# Patient Record
Sex: Female | Born: 1950
Health system: Southern US, Community
[De-identification: ages and names within clinical notes are randomized; demographics above are authoritative.]

## PROBLEM LIST (undated history)

## (undated) DIAGNOSIS — R030 Elevated blood-pressure reading, without diagnosis of hypertension: Secondary | ICD-10-CM

## (undated) DIAGNOSIS — E28319 Asymptomatic premature menopause: Secondary | ICD-10-CM

## (undated) DIAGNOSIS — H269 Unspecified cataract: Secondary | ICD-10-CM

## (undated) DIAGNOSIS — J9801 Acute bronchospasm: Secondary | ICD-10-CM

## (undated) DIAGNOSIS — M810 Age-related osteoporosis without current pathological fracture: Secondary | ICD-10-CM

## (undated) DIAGNOSIS — T7840XA Allergy, unspecified, initial encounter: Secondary | ICD-10-CM

## (undated) DIAGNOSIS — E785 Hyperlipidemia, unspecified: Secondary | ICD-10-CM

## (undated) DIAGNOSIS — I2089 Other forms of angina pectoris: Secondary | ICD-10-CM

## (undated) DIAGNOSIS — F32A Depression, unspecified: Secondary | ICD-10-CM

## (undated) DIAGNOSIS — M1990 Unspecified osteoarthritis, unspecified site: Secondary | ICD-10-CM

## (undated) DIAGNOSIS — R002 Palpitations: Secondary | ICD-10-CM

## (undated) DIAGNOSIS — E559 Vitamin D deficiency, unspecified: Secondary | ICD-10-CM

## (undated) DIAGNOSIS — I208 Other forms of angina pectoris: Secondary | ICD-10-CM

## (undated) DIAGNOSIS — F909 Attention-deficit hyperactivity disorder, unspecified type: Secondary | ICD-10-CM

## (undated) DIAGNOSIS — B349 Viral infection, unspecified: Secondary | ICD-10-CM

## (undated) DIAGNOSIS — R200 Anesthesia of skin: Secondary | ICD-10-CM

## (undated) DIAGNOSIS — R040 Epistaxis: Secondary | ICD-10-CM

## (undated) DIAGNOSIS — R3589 Other polyuria: Secondary | ICD-10-CM

## (undated) DIAGNOSIS — Z5189 Encounter for other specified aftercare: Secondary | ICD-10-CM

## (undated) DIAGNOSIS — H9190 Unspecified hearing loss, unspecified ear: Secondary | ICD-10-CM

## (undated) DIAGNOSIS — D649 Anemia, unspecified: Secondary | ICD-10-CM

## (undated) DIAGNOSIS — G43909 Migraine, unspecified, not intractable, without status migrainosus: Secondary | ICD-10-CM

## (undated) DIAGNOSIS — R5383 Other fatigue: Secondary | ICD-10-CM

## (undated) DIAGNOSIS — G4730 Sleep apnea, unspecified: Secondary | ICD-10-CM

## (undated) DIAGNOSIS — I499 Cardiac arrhythmia, unspecified: Secondary | ICD-10-CM

## (undated) DIAGNOSIS — Z923 Personal history of irradiation: Secondary | ICD-10-CM

## (undated) DIAGNOSIS — E669 Obesity, unspecified: Secondary | ICD-10-CM

## (undated) DIAGNOSIS — E039 Hypothyroidism, unspecified: Secondary | ICD-10-CM

## (undated) DIAGNOSIS — F329 Major depressive disorder, single episode, unspecified: Secondary | ICD-10-CM

## (undated) DIAGNOSIS — K5790 Diverticulosis of intestine, part unspecified, without perforation or abscess without bleeding: Secondary | ICD-10-CM

## (undated) DIAGNOSIS — R358 Other polyuria: Secondary | ICD-10-CM

## (undated) DIAGNOSIS — R569 Unspecified convulsions: Secondary | ICD-10-CM

## (undated) DIAGNOSIS — Z9889 Other specified postprocedural states: Secondary | ICD-10-CM

## (undated) DIAGNOSIS — R0600 Dyspnea, unspecified: Secondary | ICD-10-CM

## (undated) DIAGNOSIS — R202 Paresthesia of skin: Secondary | ICD-10-CM

## (undated) DIAGNOSIS — R9431 Abnormal electrocardiogram [ECG] [EKG]: Secondary | ICD-10-CM

## (undated) DIAGNOSIS — I444 Left anterior fascicular block: Secondary | ICD-10-CM

## (undated) DIAGNOSIS — Z7902 Long term (current) use of antithrombotics/antiplatelets: Secondary | ICD-10-CM

## (undated) DIAGNOSIS — R413 Other amnesia: Secondary | ICD-10-CM

## (undated) HISTORY — DX: Obesity, unspecified: E66.9

## (undated) HISTORY — DX: Asymptomatic premature menopause: E28.319

## (undated) HISTORY — DX: Depression, unspecified: F32.A

## (undated) HISTORY — DX: Anemia, unspecified: D64.9

## (undated) HISTORY — PX: CATARACT EXTRACTION W/ INTRAOCULAR LENS IMPLANT: SHX1309

## (undated) HISTORY — DX: Hypothyroidism, unspecified: E03.9

## (undated) HISTORY — PX: ABDOMINAL HYSTERECTOMY: SHX81

## (undated) HISTORY — PX: EYE SURGERY: SHX253

## (undated) HISTORY — DX: Other polyuria: R35.8

## (undated) HISTORY — DX: Major depressive disorder, single episode, unspecified: F32.9

## (undated) HISTORY — DX: Unspecified hearing loss, unspecified ear: H91.90

## (undated) HISTORY — DX: Other fatigue: R53.83

## (undated) HISTORY — DX: Allergy, unspecified, initial encounter: T78.40XA

## (undated) HISTORY — DX: Acute bronchospasm: J98.01

## (undated) HISTORY — DX: Migraine, unspecified, not intractable, without status migrainosus: G43.909

## (undated) HISTORY — DX: Other polyuria: R35.89

## (undated) HISTORY — DX: Anesthesia of skin: R20.2

## (undated) HISTORY — DX: Encounter for other specified aftercare: Z51.89

## (undated) HISTORY — DX: Elevated blood-pressure reading, without diagnosis of hypertension: R03.0

## (undated) HISTORY — DX: Anesthesia of skin: R20.0

## (undated) HISTORY — DX: Unspecified convulsions: R56.9

## (undated) HISTORY — DX: Personal history of irradiation: Z92.3

## (undated) HISTORY — PX: COLONOSCOPY: SHX174

## (undated) HISTORY — DX: Sleep apnea, unspecified: G47.30

## (undated) HISTORY — DX: Age-related osteoporosis without current pathological fracture: M81.0

## (undated) HISTORY — DX: Other amnesia: R41.3

## (undated) HISTORY — DX: Epistaxis: R04.0

## (undated) HISTORY — DX: Unspecified osteoarthritis, unspecified site: M19.90

## (undated) HISTORY — DX: Viral infection, unspecified: B34.9

## (undated) HISTORY — PX: TONSILLECTOMY: SUR1361

## (undated) HISTORY — DX: Attention-deficit hyperactivity disorder, unspecified type: F90.9

---

## 1992-02-04 DIAGNOSIS — R569 Unspecified convulsions: Secondary | ICD-10-CM

## 1992-02-04 HISTORY — DX: Unspecified convulsions: R56.9

## 2004-02-04 HISTORY — PX: ABLATION: SHX5711

## 2004-06-28 ENCOUNTER — Other Ambulatory Visit: Payer: Self-pay

## 2004-07-04 ENCOUNTER — Ambulatory Visit: Payer: Self-pay | Admitting: Unknown Physician Specialty

## 2005-02-18 ENCOUNTER — Ambulatory Visit: Payer: Self-pay | Admitting: Family Medicine

## 2005-02-18 IMAGING — CR DG CHEST 2V
1 series · 2 of 2 positions shown · non-contrast
Comparison: none

REASON FOR EXAM: BRONCHITIS
COMMENTS:

[Series 3298: postero_anterior · 0.11mm/px · 2 of 2 slices shown]
[im 1/2]
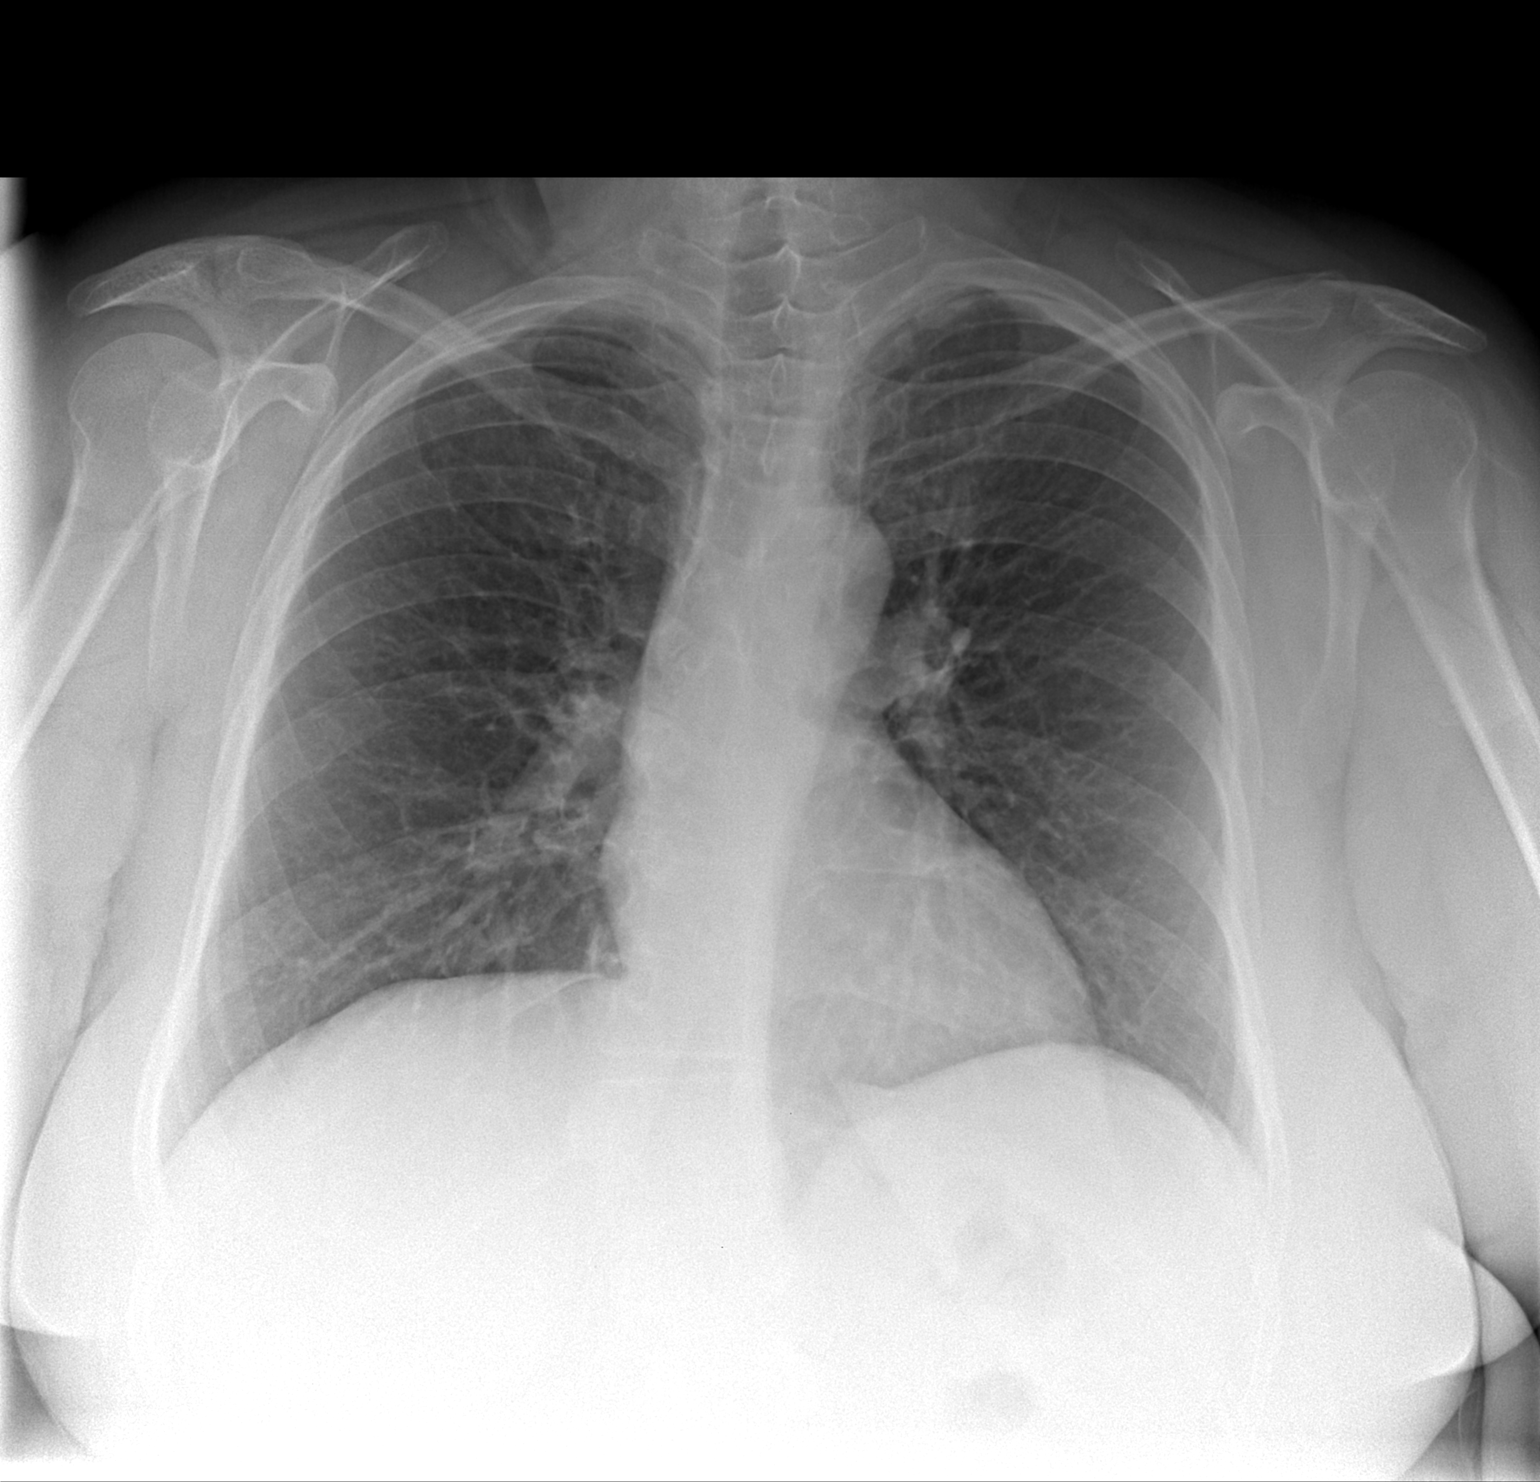
[im 2/2]
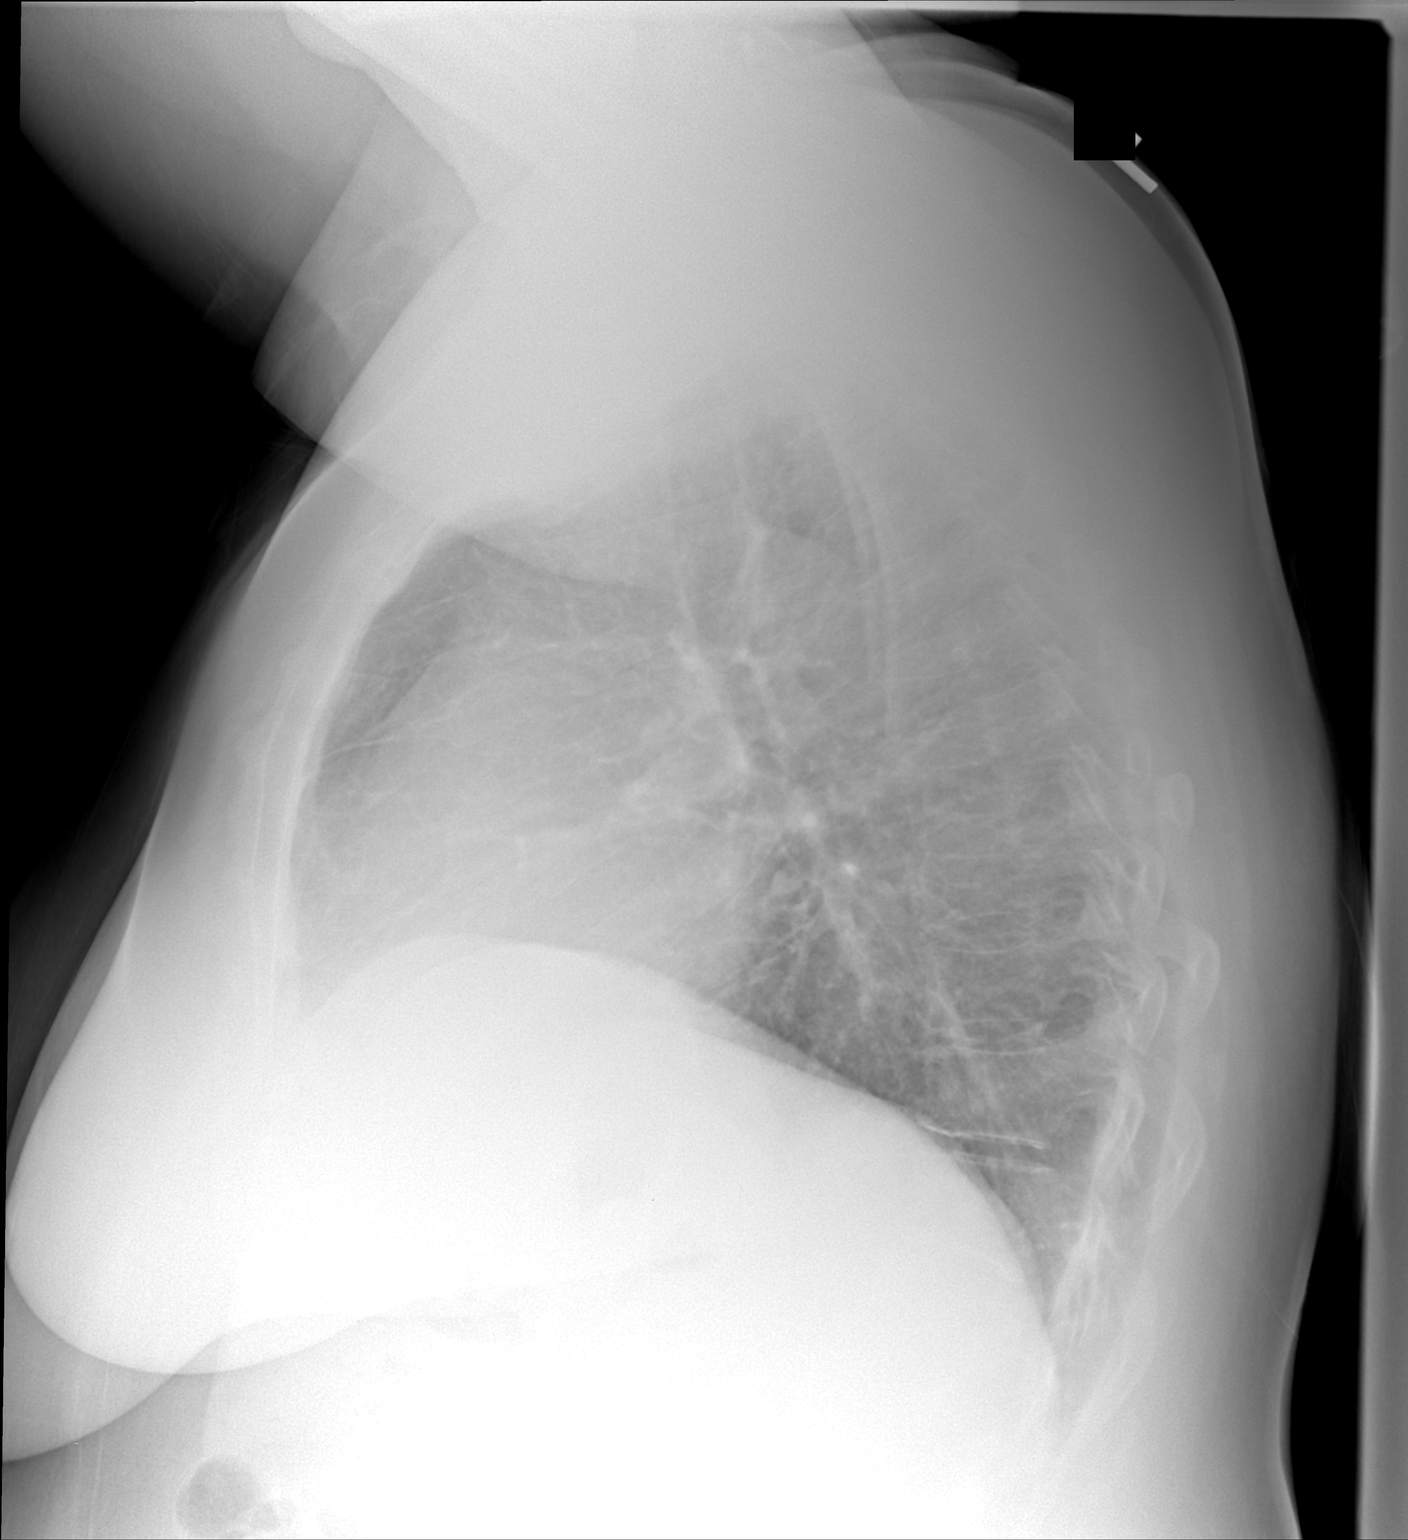

[2 of 2 positions shown; findings below may reference images not displayed]

PROCEDURE:     DXR - DXR CHEST PA (OR AP) AND LATERAL  - [DATE] [DATE]

RESULT:     The lung fields are clear.  No pneumonia, pneumothorax, or
pleural effusion is seen.  The heart, mediastinal, and osseous structures
show no acute changes.  Incidental note is made of a mild thoracic scoliosis
with convexity to the RIGHT.
IMPRESSION: No acute changes are identified.

## 2006-09-07 DIAGNOSIS — M810 Age-related osteoporosis without current pathological fracture: Secondary | ICD-10-CM | POA: Insufficient documentation

## 2006-09-07 DIAGNOSIS — F321 Major depressive disorder, single episode, moderate: Secondary | ICD-10-CM | POA: Insufficient documentation

## 2007-02-15 ENCOUNTER — Ambulatory Visit: Payer: Self-pay | Admitting: Family Medicine

## 2008-03-22 ENCOUNTER — Ambulatory Visit: Payer: Self-pay | Admitting: Family Medicine

## 2008-03-22 IMAGING — CR DG KNEE COMPLETE 4+V*L*
1 series · 4 of 4 positions shown · non-contrast
Comparison: none

REASON FOR EXAM: pain in joint
COMMENTS:

[Series 2: view not recorded · 0.17mm/px · 4 of 4 slices shown]
[im 1/4]
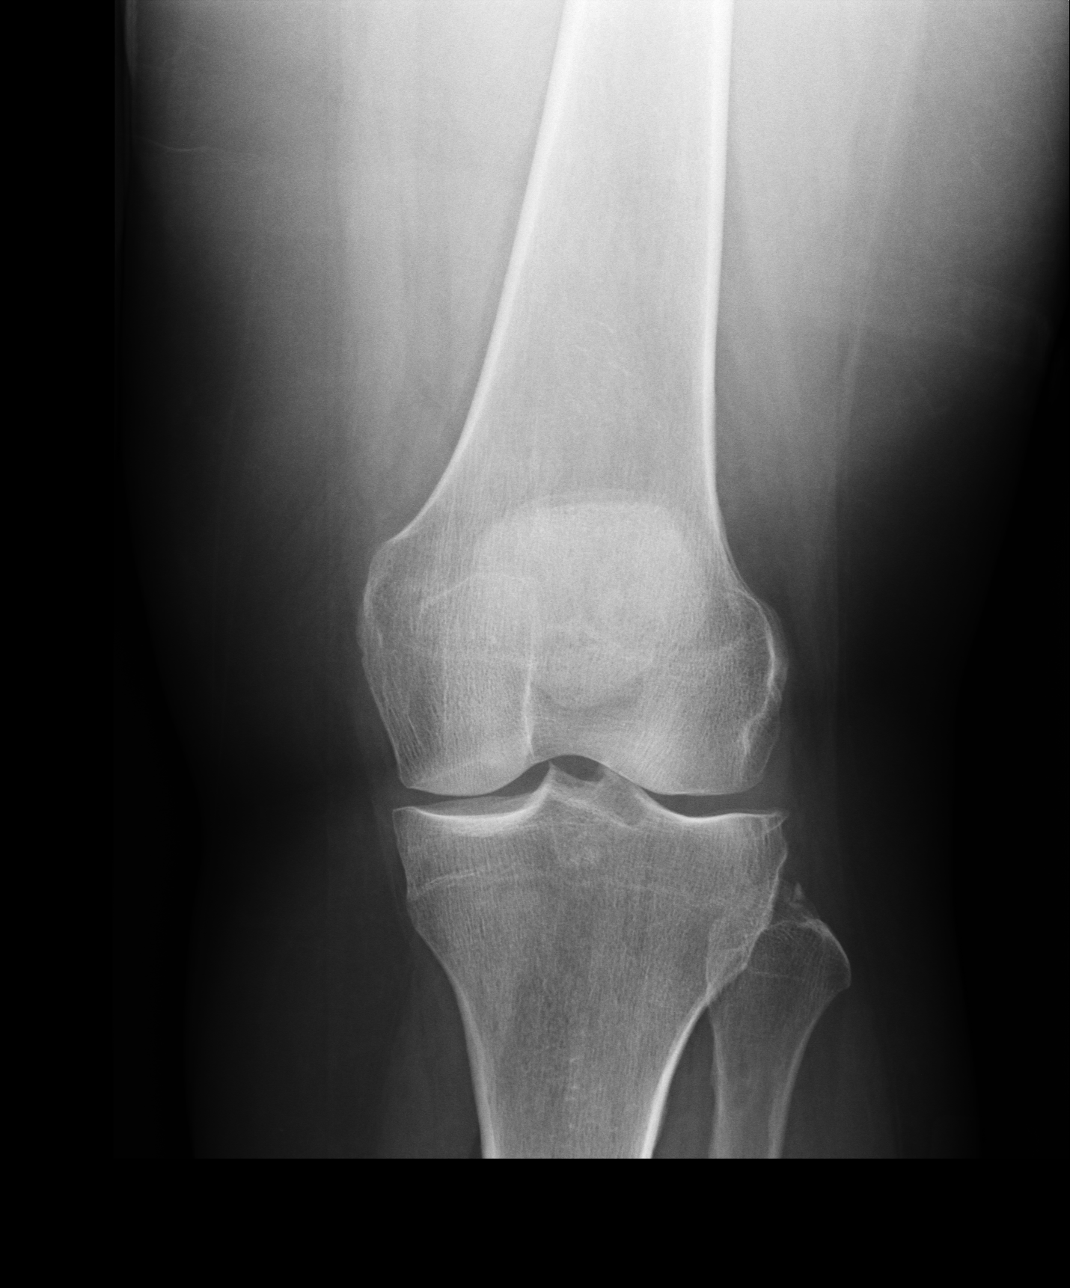
[im 2/4]
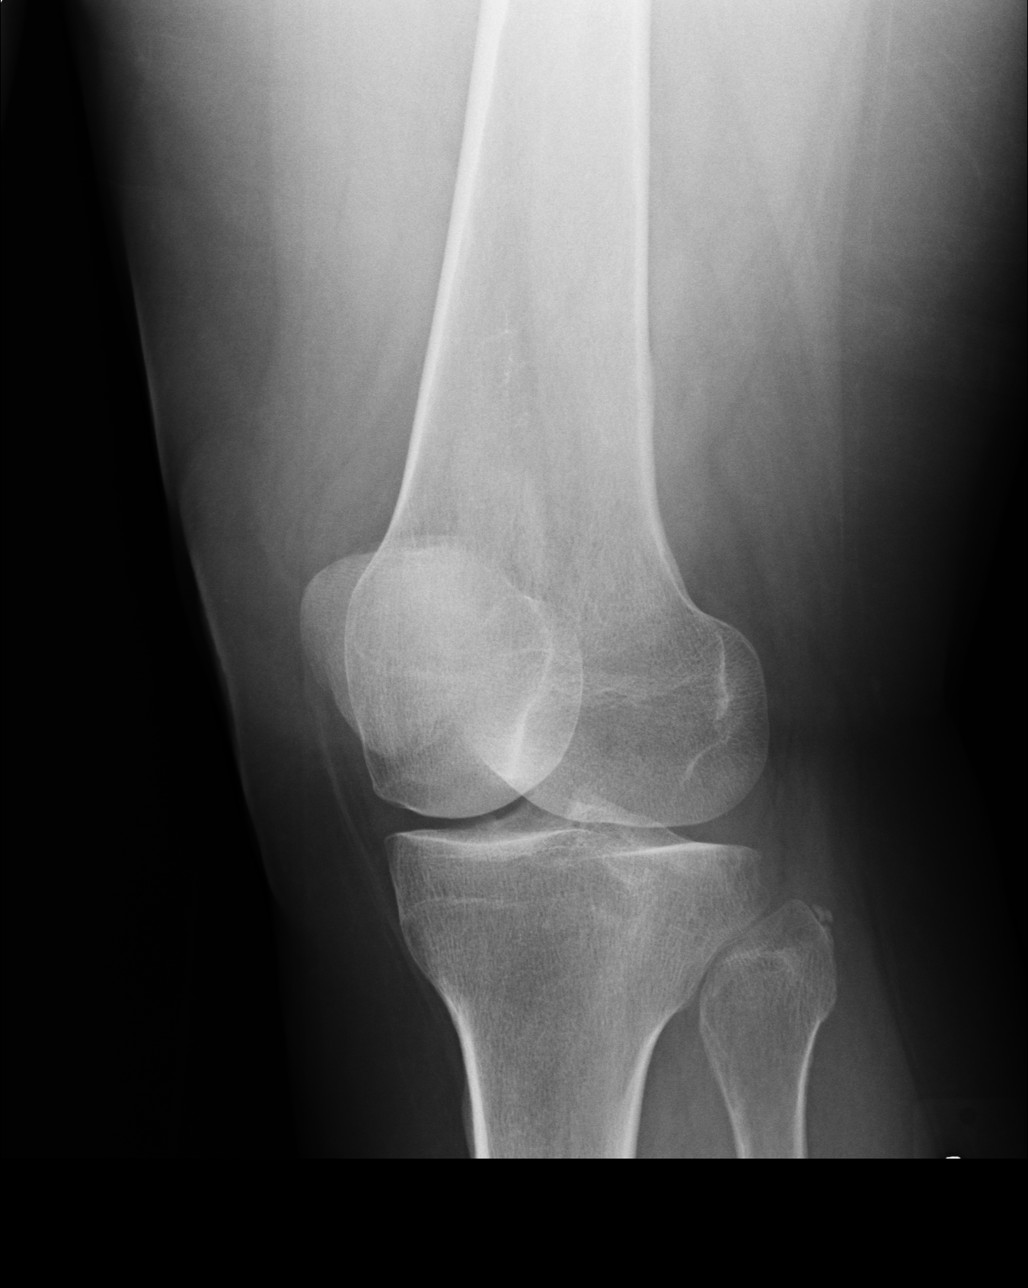
[im 3/4]
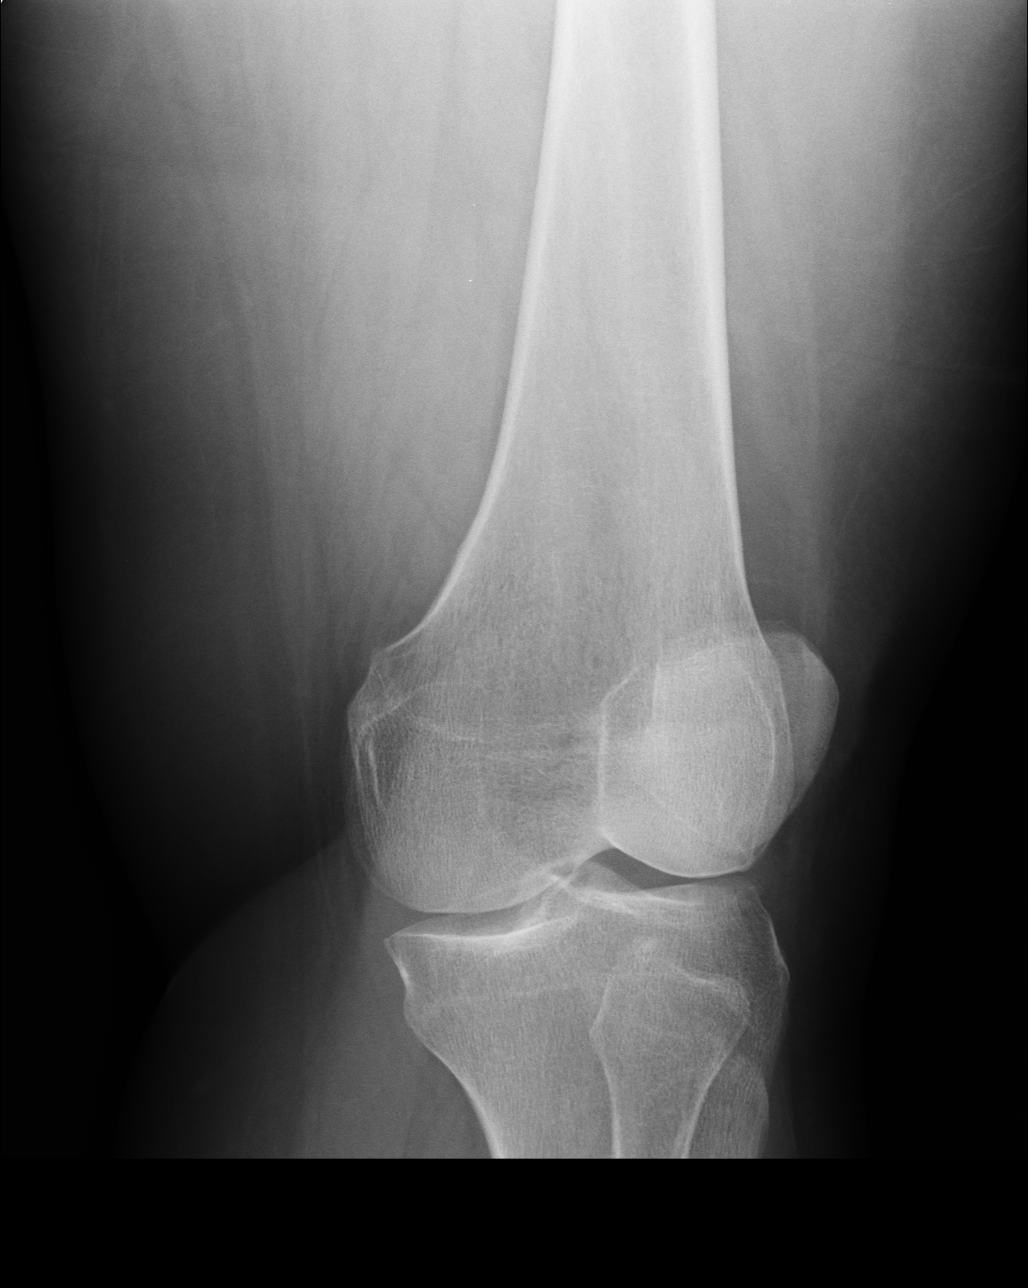
[im 4/4]
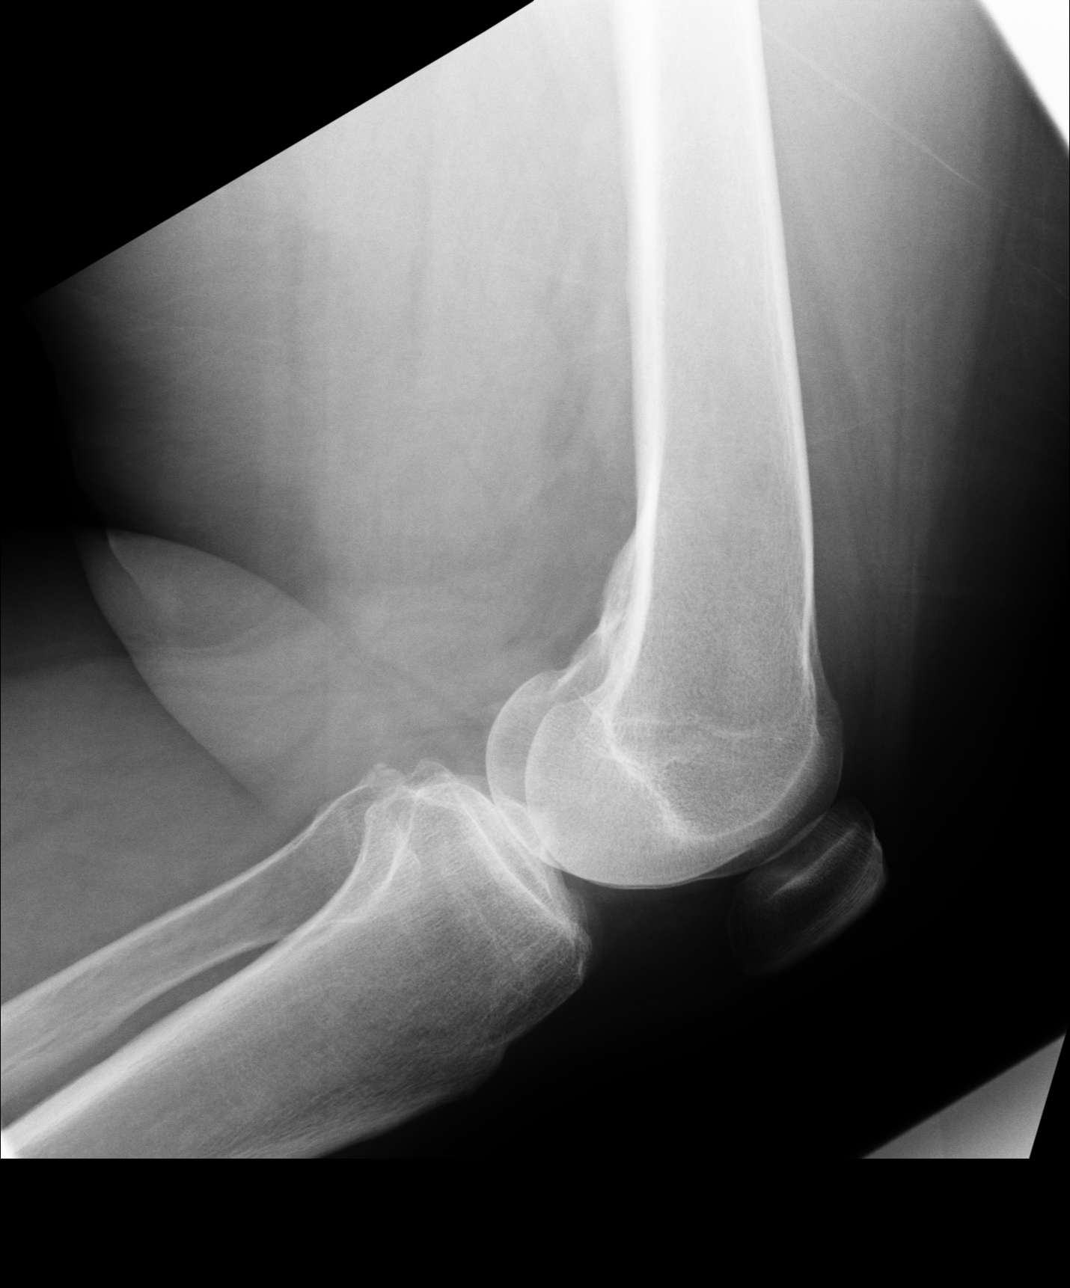

[4 of 4 positions shown; findings below may reference images not displayed]

PROCEDURE:     KDR - KDXR KNEE LT COMP WITH OBLIQUES  - [DATE] [DATE]

RESULT:     Four views of the left knee reveal the bones to be mildly
osteopenic. There is beaking of the tibial spines. There is no evidence of
acute fracture. There is a tiny bony density adjacent to the proximal
portion of the fibula but the appearance does not suggest that of an acute
or old fracture. I do not see definite evidence of a joint effusion.
IMPRESSION: There are mild degenerative changes of the left knee.
Further evaluation with MRI may be useful especially if internal derangement
is suspected.

## 2008-03-22 IMAGING — CR RIGHT HIP - COMPLETE 2+ VIEW
1 series · 2 of 2 positions shown · non-contrast
Comparison: none

REASON FOR EXAM: pain in joint
COMMENTS:

PROCEDURE:     KDR - KDXR HIP RIGHT COMPLETE  - [DATE] [DATE]
RESULT:     AP and lateral views of the right hip are submitted. The bones
are mildly osteopenic. I do not see evidence of an acute fracture nor of
significant degenerative change.

[Series 2: view not recorded · 0.17mm/px · 2 of 2 slices shown]
[im 1/2]
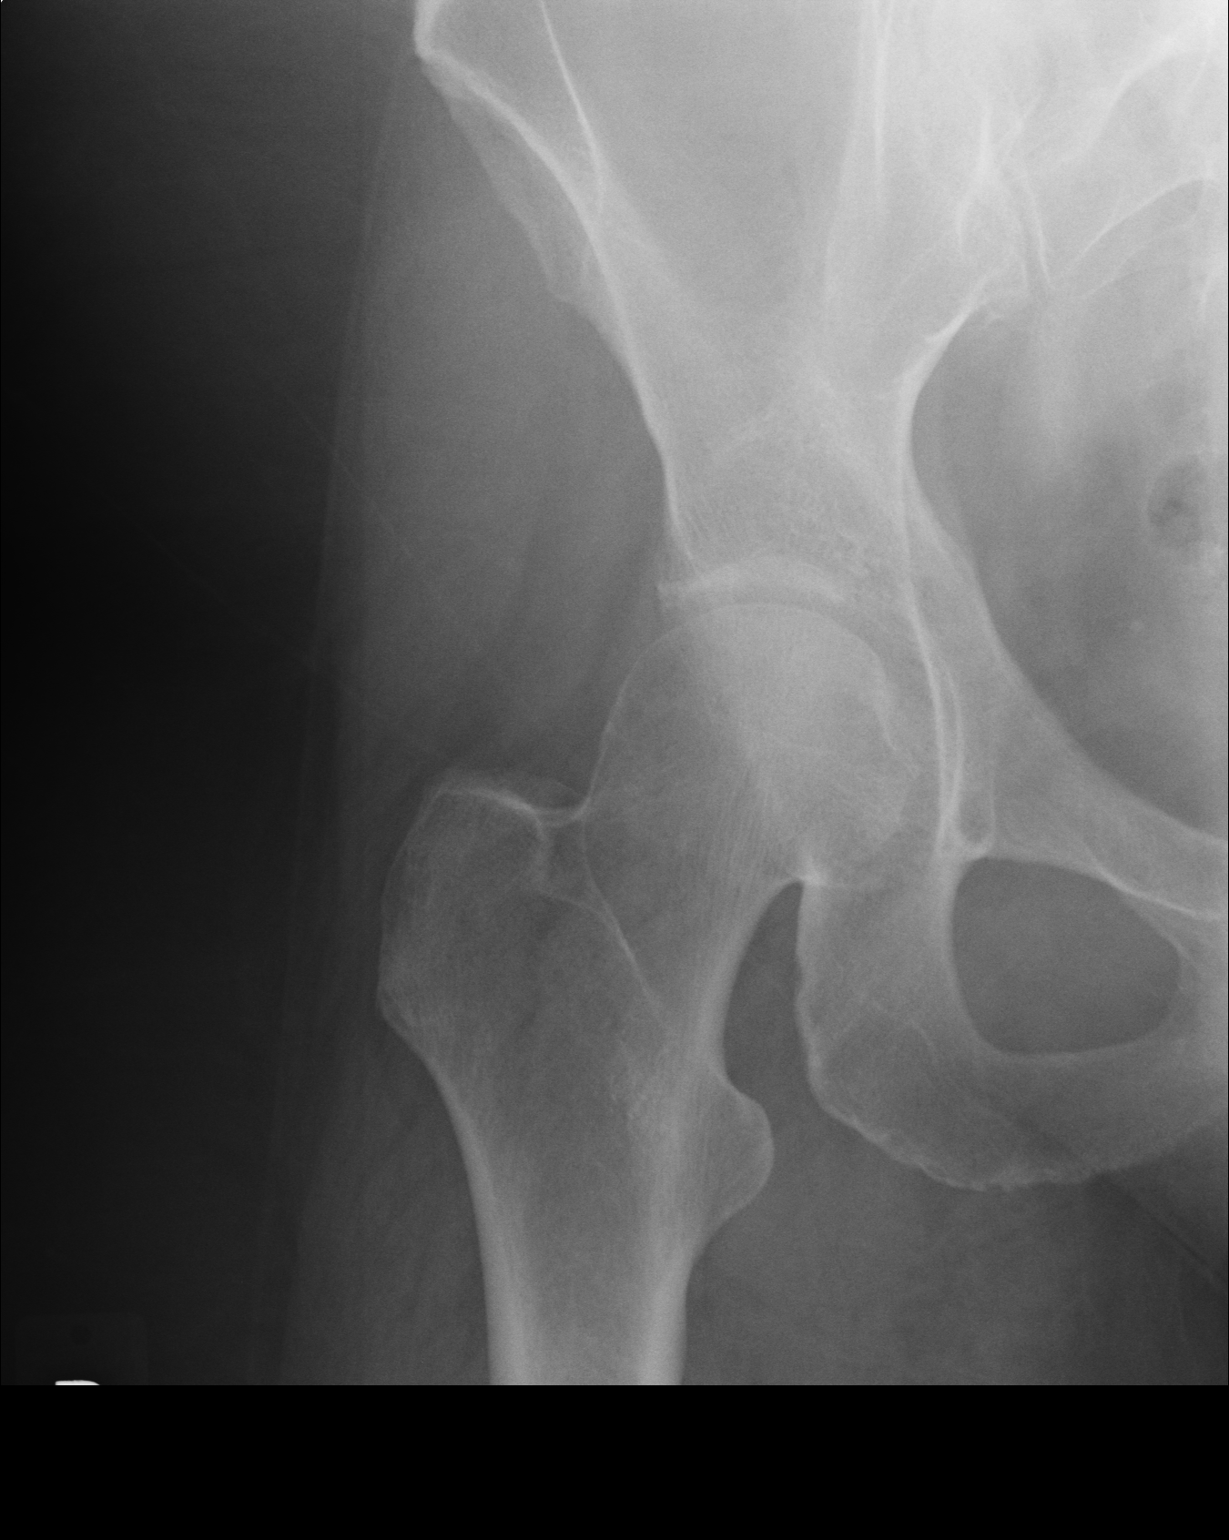
[im 2/2]
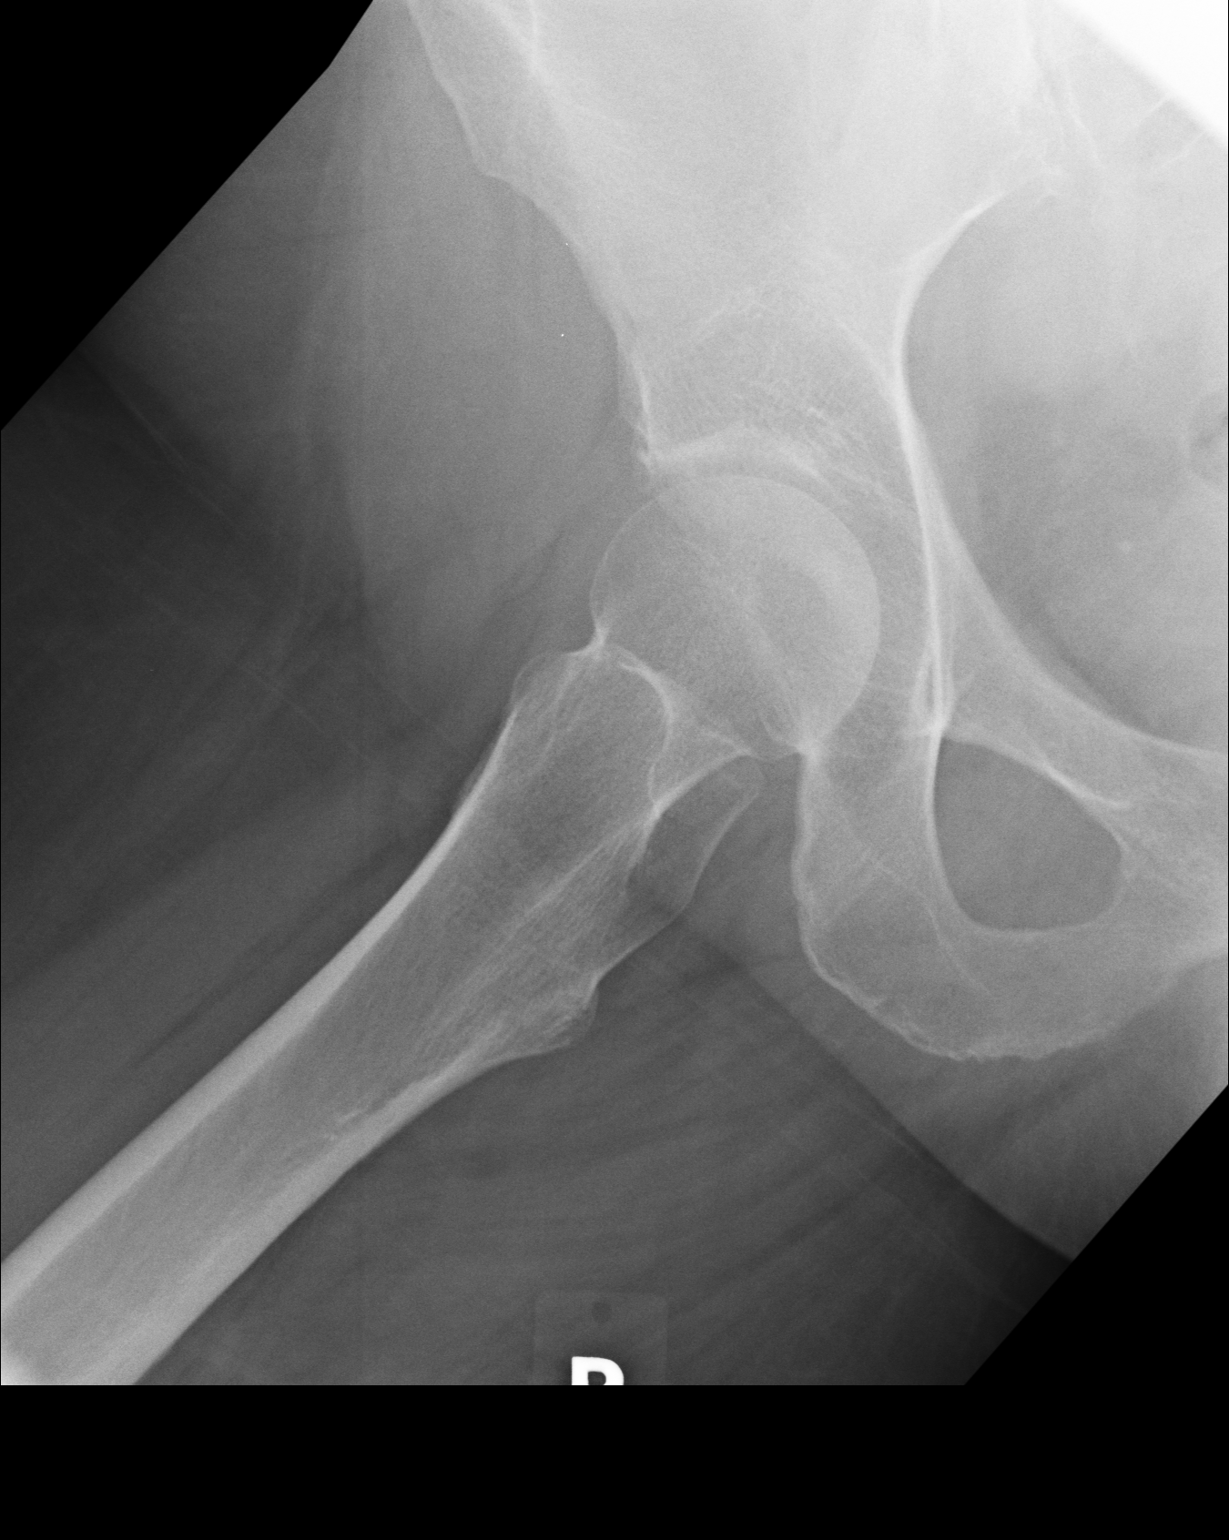

[2 of 2 positions shown; findings below may reference images not displayed]

IMPRESSION: I do not see acute abnormality of the right hip. Followup
imaging is available if the patient's symptoms warrant further evaluation

## 2008-09-19 DIAGNOSIS — G43009 Migraine without aura, not intractable, without status migrainosus: Secondary | ICD-10-CM | POA: Insufficient documentation

## 2014-08-09 ENCOUNTER — Ambulatory Visit (INDEPENDENT_AMBULATORY_CARE_PROVIDER_SITE_OTHER): Payer: BLUE CROSS/BLUE SHIELD | Admitting: Family Medicine

## 2014-08-09 ENCOUNTER — Encounter: Payer: Self-pay | Admitting: Family Medicine

## 2014-08-09 ENCOUNTER — Ambulatory Visit
Admission: RE | Admit: 2014-08-09 | Discharge: 2014-08-09 | Disposition: A | Discharge status: Home / Self Care | Payer: BLUE CROSS/BLUE SHIELD | Source: Ambulatory Visit | Attending: Family Medicine | Admitting: Family Medicine

## 2014-08-09 VITALS — BP 128/86 | HR 87 | Temp 98.0°F | Resp 18 | Ht 65.0 in | Wt 243.0 lb

## 2014-08-09 DIAGNOSIS — J40 Bronchitis, not specified as acute or chronic: Secondary | ICD-10-CM

## 2014-08-09 DIAGNOSIS — R05 Cough: Secondary | ICD-10-CM | POA: Diagnosis not present

## 2014-08-09 DIAGNOSIS — E038 Other specified hypothyroidism: Secondary | ICD-10-CM | POA: Diagnosis not present

## 2014-08-09 DIAGNOSIS — R053 Chronic cough: Secondary | ICD-10-CM

## 2014-08-09 DIAGNOSIS — E039 Hypothyroidism, unspecified: Secondary | ICD-10-CM | POA: Insufficient documentation

## 2014-08-09 MED ORDER — AZITHROMYCIN 250 MG PO TABS: ORAL_TABLET | ORAL | Status: DC

## 2014-08-09 MED ORDER — DEXTROMETHORPHAN POLISTIREX ER 30 MG/5ML PO SUER: 30.0000 mg | Freq: Two times a day (BID) | ORAL | Status: DC

## 2014-08-09 MED ORDER — TRAMADOL HCL 50 MG PO TABS: 50.0000 mg | ORAL_TABLET | Freq: Three times a day (TID) | ORAL | Status: DC | PRN

## 2014-08-09 NOTE — Progress Notes (Signed)
Name: Natalie [name]   MRN: [id number]    DOB: Jun 30, 1950   Date:08/09/2014       Progress Note  Subjective  Chief Complaint  Chief Complaint  Patient presents with  . Bronchitis    patient stated that her symptoms has persisted for 2 months now. Patient recently went to [place] and was given a steroid injection.  . Medication Refill    patient needs her medication refilled    Cough This is a new problem. The current episode started more than 1 month ago. The problem has been gradually worsening. The problem occurs constantly. Pertinent negatives include no chest pain, chills, eye redness, fever, headaches, heartburn, hemoptysis, myalgias, sore throat, shortness of breath or weight loss.  Migraine  This is a recurrent problem. The current episode started more than 1 year ago. The problem occurs intermittently. The problem has been unchanged. The pain is located in the right unilateral region. The pain radiates to the right neck. The pain quality is similar to prior headaches. The quality of the pain is described as aching and throbbing. The pain is moderate. Associated symptoms include coughing, insomnia, nausea, neck pain, phonophobia and photophobia. Pertinent negatives include no back pain, blurred vision, dizziness, eye redness, fever, hearing loss, seizures, sore throat, tingling, tinnitus, vomiting, weakness or weight loss. The symptoms are aggravated by weather changes and work. She has tried triptans and oral narcotics for the symptoms. The treatment provided moderate relief.  Thyroid Problem Presents for follow-up visit. Symptoms include cold intolerance, depressed mood, fatigue and weight gain. Patient reports no anxiety, constipation, diarrhea, palpitations, tremors or weight loss. The symptoms have been worsening. Past treatments include levothyroxine. The treatment provided mild relief. Her past medical history is significant for obesity.      Past Medical History  Diagnosis  Date  . Migraine   . Premature menopause   . Attention deficit disorder with hyperactivity   . Hypothyroidism   . Depressive disorder   . Acute bronchospasm due to viral infection   . Osteoporosis   . Obesity (BMI 30.0-34.9)   . Hearing loss   . Osteoarthritis   . Blood pressure elevated without history of HTN   . Epistaxis   . Numbness and tingling   . Polyuria   . Fatigue   . Memory change   . Anemia     history of  . Seizures     history of mini seizures, possible migraine induced    History  Substance Use Topics  . Smoking status: Former Research scientist (life sciences)  . Smokeless tobacco: Not on file     Comment: patient has not used tobacco products in 18yrs  . Alcohol Use: 0.0 oz/week    0 Standard drinks or equivalent per week     Comment: very seldom; socially     Current outpatient prescriptions:  .  buPROPion (WELLBUTRIN SR) 150 MG 12 hr tablet, Take 150 mg by mouth 2 (two) times daily., Disp: , Rfl:  .  citalopram (CELEXA) 40 MG tablet, Take 40 mg by mouth daily., Disp: , Rfl:  .  levothyroxine (SYNTHROID, LEVOTHROID) 75 MCG tablet, Take 75 mcg by mouth daily before breakfast., Disp: , Rfl:  .  meloxicam (MOBIC) 7.5 MG tablet, Take 7.5 mg by mouth daily., Disp: , Rfl:  .  SUMAtriptan (IMITREX) 100 MG tablet, Take 100 mg by mouth every 2 (two) hours as needed for migraine. May repeat in 2 hours if headache persists or recurs., Disp: , Rfl:  .  topiramate (TOPAMAX) 25 MG tablet, Take 25 mg by mouth 2 (two) times daily., Disp: , Rfl:   Allergies  Allergen Reactions  . Sulfur Hives    Review of Systems  Constitutional: Positive for weight gain, malaise/fatigue and fatigue. Negative for fever, chills and weight loss.       Obesity  HENT: Negative.  Negative for congestion, hearing loss, sore throat and tinnitus.   Eyes: Positive for photophobia. Negative for blurred vision, double vision and redness.  Respiratory: Positive for cough. Negative for hemoptysis and shortness of breath.    Cardiovascular: Negative for chest pain, palpitations, orthopnea, claudication and leg swelling.  Gastrointestinal: Positive for nausea. Negative for heartburn, vomiting, diarrhea, constipation and blood in stool.  Genitourinary: Negative for dysuria, urgency, frequency and hematuria.  Musculoskeletal: Positive for neck pain. Negative for myalgias, back pain, joint pain and falls.  Skin: Negative for itching.  Neurological: Negative for dizziness, tingling, tremors, focal weakness, seizures, loss of consciousness, weakness and headaches.  Endo/Heme/Allergies: Positive for cold intolerance. Does not bruise/bleed easily.  Psychiatric/Behavioral: Positive for depression and memory loss. Negative for substance abuse. The patient has insomnia. The patient is not nervous/anxious.      Objective  Filed Vitals:   08/09/14 1010  BP: 128/86  Pulse: 87  Temp: 98 F (36.7 C)  TempSrc: Oral  Resp: 18  Height: 5\' 5"  (1.651 m)  Weight: 243 lb (110.224 kg)  SpO2: 94%     Physical Exam  Constitutional: She is oriented to person, place, and time and well-developed, well-nourished, and in no distress.  Obese  HENT:  Head: Normocephalic.  Eyes: EOM are normal. Pupils are equal, round, and reactive to light.  Neck: Normal range of motion. No thyromegaly present.  Cardiovascular: Normal rate, regular rhythm and normal heart sounds.   No murmur heard. Pulmonary/Chest: Effort normal and breath sounds normal.  Abdominal: Soft. Bowel sounds are normal.  Musculoskeletal: Normal range of motion. She exhibits no edema.  Neurological: She is alert and oriented to person, place, and time. No cranial nerve deficit. Gait normal.  Skin: Skin is warm and dry. No rash noted.  Psychiatric: Memory normal.  Depressed and anxious      Assessment & Plan  1. Bronchitis  - traMADol (ULTRAM) 50 MG tablet; Take 1 tablet (50 mg total) by mouth every 8 (eight) hours as needed.  Dispense: 30 tablet; Refill:  0 - dextromethorphan (DELSYM) 30 MG/5ML liquid; Take 5 mLs (30 mg total) by mouth 2 (two) times daily.  Dispense: 89 mL; Refill: 0 - azithromycin (ZITHROMAX) 250 MG tablet; 2 x 1 day then one daily  Dispense: 6 tablet; Refill: 0  2. Chronic cough Secondary to above - DG Chest 2 View; Future  3. Other specified hypothyroidism Lab on today

## 2014-08-09 NOTE — Progress Notes (Signed)
Name: Natalie [name]   MRN: [id number]    DOB: 02-04-1950   Date:08/09/2014       Progress Note  Subjective  Chief Complaint  Chief Complaint  Patient presents with  . Bronchitis    patient stated that her symptoms has persisted for 2 months now. Patient recently went to [place] and was given a steroid injection.  . Medication Refill    patient needs her medication refilled    HPI  Patient is here today with concerns regarding the following symptoms non productive cough and achiness that started 2 months ago.  Associated with fatigue and malaise. Patient was recently seen at [facility] and was given a steroid injection. She was offered 3 breathing treatments but declined. Patient has used otc throat lozenges and has increased her fluids. Patient states although she has had these symptoms for approximately 2 months it has worsened the last week. Patient reports of nausea, vomiting and lack of energy.  No problem-specific assessment & plan notes found for this encounter.   Past Medical History  Diagnosis Date  . Migraine   . Premature menopause   . Attention deficit disorder with hyperactivity   . Hypothyroidism   . Depressive disorder   . Acute bronchospasm due to viral infection   . Osteoporosis   . Obesity (BMI 30.0-34.9)   . Hearing loss   . Osteoarthritis   . Blood pressure elevated without history of HTN   . Epistaxis   . Numbness and tingling   . Polyuria   . Fatigue   . Memory change   . Anemia     history of  . Seizures     history of mini seizures, possible migraine induced    History  Substance Use Topics  . Smoking status: Former Research scientist (life sciences)  . Smokeless tobacco: Not on file     Comment: patient has not used tobacco products in 63yrs  . Alcohol Use: 0.0 oz/week    0 Standard drinks or equivalent per week     Comment: very seldom; socially     Current outpatient prescriptions:  .  buPROPion (WELLBUTRIN SR) 150 MG 12 hr tablet, Take 150 mg by mouth 2 (two)  times daily., Disp: , Rfl:  .  citalopram (CELEXA) 40 MG tablet, Take 40 mg by mouth daily., Disp: , Rfl:  .  levothyroxine (SYNTHROID, LEVOTHROID) 75 MCG tablet, Take 75 mcg by mouth daily before breakfast., Disp: , Rfl:  .  meloxicam (MOBIC) 7.5 MG tablet, Take 7.5 mg by mouth daily., Disp: , Rfl:  .  SUMAtriptan (IMITREX) 100 MG tablet, Take 100 mg by mouth every 2 (two) hours as needed for migraine. May repeat in 2 hours if headache persists or recurs., Disp: , Rfl:  .  topiramate (TOPAMAX) 25 MG tablet, Take 25 mg by mouth 2 (two) times daily., Disp: , Rfl:   Allergies  Allergen Reactions  . Sulfur Hives    ROS  10 Systems reviewed and is negative except as mentioned in HPI.   Objective  Filed Vitals:   08/09/14 1010  BP: 128/86  Pulse: 87  Temp: 98 F (36.7 C)  TempSrc: Oral  Resp: 18  Height: 5\' 5"  (1.651 m)  Weight: 243 lb (110.224 kg)  SpO2: 94%   Body mass index is 40.44 kg/(m^2).   Physical Exam  Constitutional: Patient appears well-developed and well-nourished. In no acute distress but does appear to be fatigued from acute illness. HEENT:  - Head: Normocephalic and atraumatic.  -  Ears: RIGHT TM bulging with minimal clear exudate, LEFT TM bulging with minimal clear exudate.  - Nose: Nasal mucosa boggy and congested.  - Mouth/Throat: Oropharynx is moist with slight erythema of bilateral tonsils without hypertrophy or exudates. Post nasal drainage present.  - Eyes: Conjunctivae clear, EOM movements normal. PERRLA. No scleral icterus.  Neck: Normal range of motion. Neck supple. No JVD present. No thyromegaly present. No local lymphadenopathy. Cardiovascular: Regular rate, regular rhythm with no murmurs heard.  Pulmonary/Chest: Effort normal and breath sounds clear in all lung fields.  Musculoskeletal: Normal range of motion bilateral UE and LE, no joint effusions. Skin: Skin is warm and dry. No rash noted. Psychiatric: Patient has a normal mood and affect.  Behavior is normal in office today. Judgment and thought content normal in office today.   Assessment & Plan  Etiologies include allergic rhinitis, viral or bacterial infection. Instructed patient on increasing hydration, nasal saline spray, steam inhalation, NSAID if tolerated and not contraindicated. If not already doing so start taking daily anti-histamine and use a steroid nasal spray. If symptoms persist/worsen may consider antibiotic therapy.

## 2014-08-09 NOTE — Patient Instructions (Signed)

## 2014-08-10 ENCOUNTER — Telehealth: Payer: Self-pay | Admitting: Emergency Medicine

## 2014-08-10 NOTE — Telephone Encounter (Signed)
Left message for patient of negative chest xray

## 2014-08-11 ENCOUNTER — Encounter: Payer: Self-pay | Admitting: Family Medicine

## 2014-09-26 ENCOUNTER — Other Ambulatory Visit: Payer: Self-pay | Admitting: Family Medicine

## 2014-10-26 ENCOUNTER — Encounter: Payer: Self-pay | Admitting: Family Medicine

## 2014-10-26 ENCOUNTER — Ambulatory Visit (INDEPENDENT_AMBULATORY_CARE_PROVIDER_SITE_OTHER): Payer: BLUE CROSS/BLUE SHIELD | Admitting: Family Medicine

## 2014-10-26 VITALS — BP 126/77 | HR 84 | Temp 98.7°F | Resp 19 | Ht 65.0 in | Wt 235.4 lb

## 2014-10-26 DIAGNOSIS — Z23 Encounter for immunization: Secondary | ICD-10-CM

## 2014-10-26 DIAGNOSIS — R05 Cough: Secondary | ICD-10-CM | POA: Diagnosis not present

## 2014-10-26 DIAGNOSIS — R058 Other specified cough: Secondary | ICD-10-CM

## 2014-10-26 MED ORDER — FLUTICASONE PROPIONATE 50 MCG/ACT NA SUSP: 1.0000 | Freq: Every day | NASAL | Status: DC

## 2014-10-26 MED ORDER — BENZONATATE 200 MG PO CAPS: 200.0000 mg | ORAL_CAPSULE | Freq: Three times a day (TID) | ORAL | Status: DC | PRN

## 2014-10-26 MED ORDER — AZITHROMYCIN 250 MG PO TABS: ORAL_TABLET | ORAL | Status: DC

## 2014-10-26 NOTE — Progress Notes (Signed)
Name: Natalie [name]   MRN: [id number]    DOB: 09-17-1950   Date:10/26/2014       Progress Note Subjective  Chief Complaint  Chief Complaint  Patient presents with  . Acute Visit    Possible Bronchitis  . Cough  . Fatigue    Cough This is a new (started after she 'scoped' her yard and started coughing the day after. Thinks it may be from allergies.) problem. The current episode started 1 to 4 weeks ago. The cough is productive of sputum. Pertinent negatives include no chills, fever, nasal congestion, sore throat or shortness of breath. The symptoms are aggravated by pollens. She has tried OTC cough suppressant for the symptoms. Her past medical history is significant for environmental allergies (Spring and Fall season allergies.). There is no history of asthma, bronchitis or COPD.   Past Medical History  Diagnosis Date  . Migraine   . Premature menopause   . Attention deficit disorder with hyperactivity   . Hypothyroidism   . Depressive disorder   . Acute bronchospasm due to viral infection   . Osteoporosis   . Obesity (BMI 30.0-34.9)   . Hearing loss   . Osteoarthritis   . Blood pressure elevated without history of HTN   . Epistaxis   . Numbness and tingling   . Polyuria   . Fatigue   . Memory change   . Anemia     history of  . Seizures     history of mini seizures, possible migraine induced    Past Surgical History  Procedure Laterality Date  . Ablation  2006    Family History  Problem Relation Age of Onset  . Early death Father     suicide  . Depression Father   . Diabetes Father   . Cancer Father     prostate  . Hearing loss Father     due to war  . Alzheimer's disease Mother   . Heart disease Brother   . Heart disease Maternal Aunt   . Dementia Maternal Aunt   . Heart disease Maternal Uncle   . Dementia Maternal Grandmother   . Heart disease Brother     Social History   Social History  . Marital Status: Single    Spouse Name: N/A  . Number of  Children: 1  . Years of Education: N/A   Occupational History  . Not on file.   Social History Main Topics  . Smoking status: Former Research scientist (life sciences)  . Smokeless tobacco: Not on file     Comment: patient has not used tobacco products in 52yrs  . Alcohol Use: 0.0 oz/week    0 Standard drinks or equivalent per week     Comment: very seldom; socially  . Drug Use: No  . Sexual Activity: No   Other Topics Concern  . Not on file   Social History Narrative    Current outpatient prescriptions:  .  buPROPion (WELLBUTRIN SR) 150 MG 12 hr tablet, Take 150 mg by mouth 2 (two) times daily., Disp: , Rfl:  .  citalopram (CELEXA) 40 MG tablet, Take 40 mg by mouth daily., Disp: , Rfl:  .  levothyroxine (SYNTHROID, LEVOTHROID) 75 MCG tablet, TAKE ONE TABLET BY MOUTH EVERY DAY, Disp: 30 tablet, Rfl: 4 .  meloxicam (MOBIC) 7.5 MG tablet, Take 7.5 mg by mouth daily., Disp: , Rfl:  .  SUMAtriptan (IMITREX) 100 MG tablet, Take 100 mg by mouth every 2 (two) hours as needed for migraine. May  repeat in 2 hours if headache persists or recurs., Disp: , Rfl:  .  topiramate (TOPAMAX) 25 MG tablet, Take 25 mg by mouth 2 (two) times daily., Disp: , Rfl:  .  azithromycin (ZITHROMAX Z-PAK) 250 MG tablet, 2 tabs po x day 1, then 1 tab po q day x 4 days., Disp: 6 each, Rfl: 0 .  benzonatate (TESSALON) 200 MG capsule, Take 1 capsule (200 mg total) by mouth 3 (three) times daily as needed for cough., Disp: 21 capsule, Rfl: 0 .  fluticasone (FLONASE) 50 MCG/ACT nasal spray, Place 1 spray into both nostrils daily., Disp: 16 g, Rfl: 0  Allergies  Allergen Reactions  . Sulfur Hives  . Sulfamethoxazole-Trimethoprim     Review of Systems  Constitutional: Negative for fever and chills.  HENT: Negative for sore throat.   Respiratory: Positive for cough. Negative for shortness of breath.   Endo/Heme/Allergies: Positive for environmental allergies (Spring and Fall season allergies.).   Objective  Filed Vitals:   10/26/14  1350  BP: 126/77  Pulse: 84  Temp: 98.7 F (37.1 C)  TempSrc: Oral  Resp: 19  Height: 5\' 5"  (1.651 m)  Weight: 235 lb 6.4 oz (106.777 kg)  SpO2: 93%    Physical Exam  Constitutional: She is well-developed, well-nourished, and in no distress.  HENT:  Mouth/Throat: No posterior oropharyngeal erythema.  Cardiovascular: Normal rate and regular rhythm.   Pulmonary/Chest: Breath sounds normal.  Crackles on auscultation on the left side of chest  Nursing note and vitals reviewed.  Assessment & Plan  1. Need for immunization against influenza  - Flu Vaccine QUAD 36+ mos IM  2. Productive cough One-week history of recurrent productive cough, unresponsive to OTC therapy. We'll prescribe Z-Pak, Tessalon and Flonase for relief of respiratory and allergic symptoms. If her symptoms persist, she will need a chest x-ray for evaluation. Patient verbalized understanding with the plan. - azithromycin (ZITHROMAX Z-PAK) 250 MG tablet; 2 tabs po x day 1, then 1 tab po q day x 4 days.  Dispense: 6 each; Refill: 0 - benzonatate (TESSALON) 200 MG capsule; Take 1 capsule (200 mg total) by mouth 3 (three) times daily as needed for cough.  Dispense: 21 capsule; Refill: 0 - fluticasone (FLONASE) 50 MCG/ACT nasal spray; Place 1 spray into both nostrils daily.  Dispense: 16 g; Refill: 0    Syed Asad A. [facility] 10/26/2014 7:08 PM

## 2014-10-31 ENCOUNTER — Ambulatory Visit
Admission: RE | Admit: 2014-10-31 | Discharge: 2014-10-31 | Disposition: A | Discharge status: Home / Self Care | Payer: BLUE CROSS/BLUE SHIELD | Source: Ambulatory Visit | Attending: Family Medicine | Admitting: Family Medicine

## 2014-10-31 ENCOUNTER — Encounter: Payer: Self-pay | Admitting: Family Medicine

## 2014-10-31 ENCOUNTER — Ambulatory Visit (INDEPENDENT_AMBULATORY_CARE_PROVIDER_SITE_OTHER): Payer: BLUE CROSS/BLUE SHIELD | Admitting: Family Medicine

## 2014-10-31 VITALS — BP 120/84 | HR 81 | Temp 98.2°F | Resp 18 | Ht 65.0 in | Wt 235.6 lb

## 2014-10-31 DIAGNOSIS — R053 Chronic cough: Secondary | ICD-10-CM

## 2014-10-31 DIAGNOSIS — R05 Cough: Secondary | ICD-10-CM

## 2014-10-31 MED ORDER — GUAIFENESIN-CODEINE 100-10 MG/5ML PO SYRP: 10.0000 mL | ORAL_SOLUTION | Freq: Four times a day (QID) | ORAL | Status: DC | PRN

## 2014-10-31 NOTE — Progress Notes (Signed)
Name: Natalie [name]   MRN: [id number]    DOB: 1951/01/27   Date:10/31/2014       Progress Note  Subjective  Chief Complaint  Chief Complaint  Patient presents with  . Acute Visit    Cough/wheezing not feeling better    HPI Patient is here for follow-up from last week, she was seen for 2 week history of persistent productive cough with greenish mucus, unresponsive to OTC therapy. She was started on Z-Pak and [employer]. Pt. reports persistent cough, but now with clear mucus. She has no fevers or chills.  Past Medical History  Diagnosis Date  . Migraine   . Premature menopause   . Attention deficit disorder with hyperactivity   . Hypothyroidism   . Depressive disorder   . Acute bronchospasm due to viral infection   . Osteoporosis   . Obesity (BMI 30.0-34.9)   . Hearing loss   . Osteoarthritis   . Blood pressure elevated without history of HTN   . Epistaxis   . Numbness and tingling   . Polyuria   . Fatigue   . Memory change   . Anemia     history of  . Seizures     history of mini seizures, possible migraine induced    Past Surgical History  Procedure Laterality Date  . Ablation  2006    Family History  Problem Relation Age of Onset  . Early death Father     suicide  . Depression Father   . Diabetes Father   . Cancer Father     prostate  . Hearing loss Father     due to war  . Alzheimer's disease Mother   . Heart disease Brother   . Heart disease Maternal Aunt   . Dementia Maternal Aunt   . Heart disease Maternal Uncle   . Dementia Maternal Grandmother   . Heart disease Brother     Social History   Social History  . Marital Status: Single    Spouse Name: N/A  . Number of Children: 1  . Years of Education: N/A   Occupational History  . Not on file.   Social History Main Topics  . Smoking status: Former Research scientist (life sciences)  . Smokeless tobacco: Not on file     Comment: patient has not used tobacco products in 102yrs  . Alcohol Use: 0.0 oz/week    0  Standard drinks or equivalent per week     Comment: very seldom; socially  . Drug Use: No  . Sexual Activity: No   Other Topics Concern  . Not on file   Social History Narrative    Current outpatient prescriptions:  .  buPROPion (WELLBUTRIN SR) 150 MG 12 hr tablet, Take 150 mg by mouth 2 (two) times daily., Disp: , Rfl:  .  citalopram (CELEXA) 40 MG tablet, Take 40 mg by mouth daily., Disp: , Rfl:  .  fluticasone (FLONASE) 50 MCG/ACT nasal spray, Place 1 spray into both nostrils daily., Disp: 16 g, Rfl: 0 .  levothyroxine (SYNTHROID, LEVOTHROID) 75 MCG tablet, TAKE ONE TABLET BY MOUTH EVERY DAY, Disp: 30 tablet, Rfl: 4 .  meloxicam (MOBIC) 7.5 MG tablet, Take 7.5 mg by mouth daily., Disp: , Rfl:  .  SUMAtriptan (IMITREX) 100 MG tablet, Take 100 mg by mouth every 2 (two) hours as needed for migraine. May repeat in 2 hours if headache persists or recurs., Disp: , Rfl:  .  topiramate (TOPAMAX) 25 MG tablet, Take 25 mg by mouth  2 (two) times daily., Disp: , Rfl:  .  traMADol (ULTRAM) 50 MG tablet, , Disp: , Rfl: 0 .  guaiFENesin-codeine (ROBITUSSIN AC) 100-10 MG/5ML syrup, Take 10 mLs by mouth 4 (four) times daily as needed for cough., Disp: 200 mL, Rfl: 0  Allergies  Allergen Reactions  . Sulfur Hives  . Sulfamethoxazole-Trimethoprim     Review of Systems  Constitutional: Negative for fever and chills.  Respiratory: Positive for cough and sputum production. Negative for hemoptysis, shortness of breath and wheezing.    Objective  Filed Vitals:   10/31/14 0835  BP: 120/84  Pulse: 81  Temp: 98.2 F (36.8 C)  TempSrc: Oral  Resp: 18  Height: 5\' 5"  (1.651 m)  Weight: 235 lb 9.6 oz (106.867 kg)  SpO2: 93%    Physical Exam  Constitutional: She is well-developed, well-nourished, and in no distress.  HENT:  Head: Normocephalic and atraumatic.  Mouth/Throat: No posterior oropharyngeal erythema.  Cardiovascular: Normal rate and regular rhythm.   Pulmonary/Chest: Breath sounds  normal.  Crackles in left lung still auscultated.  Nursing note and vitals reviewed.   Assessment & Plan  1. Persistent cough for 3 weeks or longer Cough unresponsive to [employer] We will switch to codeine with guaifenesin. Chest x-ray was ordered for evaluation of crackles. If patient is not improving within 48 hours, will consider prednisone. Patient verbalized understanding the plan.  - DG Chest 2 View; Future - guaiFENesin-codeine (ROBITUSSIN AC) 100-10 MG/5ML syrup; Take 10 mLs by mouth 4 (four) times daily as needed for cough.  Dispense: 200 mL; Refill: 0   Syed Asad A. [facility] 10/31/2014 5:32 PM

## 2014-11-02 ENCOUNTER — Telehealth: Payer: Self-pay | Admitting: Family Medicine

## 2014-11-02 NOTE — Telephone Encounter (Signed)
Wanted to let you know that she is still coughing, it has improved some but not a lot (not really productive cough). She is still wheezing and have heaviness. Also requesting xray results from Tuesday.

## 2014-11-03 NOTE — Telephone Encounter (Signed)
Spoke with patient and she stated she is going to take the prescribed cough medicine and see how it works and if the cough continues to hang on she will come back to be seen in office and take the steroid suggested by Dr. Manuella Ghazi , she will call some time next week to let us know

## 2015-02-12 ENCOUNTER — Ambulatory Visit: Payer: BLUE CROSS/BLUE SHIELD | Admitting: Family Medicine

## 2015-03-26 ENCOUNTER — Other Ambulatory Visit: Payer: Self-pay | Admitting: Family Medicine

## 2015-04-16 ENCOUNTER — Ambulatory Visit: Payer: BLUE CROSS/BLUE SHIELD | Admitting: Family Medicine

## 2015-04-21 ENCOUNTER — Other Ambulatory Visit: Payer: Self-pay | Admitting: Family Medicine

## 2015-04-30 DIAGNOSIS — F330 Major depressive disorder, recurrent, mild: Secondary | ICD-10-CM | POA: Diagnosis not present

## 2015-05-14 ENCOUNTER — Other Ambulatory Visit: Payer: Self-pay

## 2015-05-14 ENCOUNTER — Ambulatory Visit: Payer: BLUE CROSS/BLUE SHIELD | Admitting: Family Medicine

## 2015-05-14 ENCOUNTER — Ambulatory Visit: Payer: Self-pay

## 2015-05-14 DIAGNOSIS — Z5189 Encounter for other specified aftercare: Secondary | ICD-10-CM

## 2015-05-17 DIAGNOSIS — I209 Angina pectoris, unspecified: Secondary | ICD-10-CM | POA: Diagnosis not present

## 2015-05-17 DIAGNOSIS — E669 Obesity, unspecified: Secondary | ICD-10-CM | POA: Diagnosis not present

## 2015-05-17 DIAGNOSIS — I208 Other forms of angina pectoris: Secondary | ICD-10-CM | POA: Diagnosis not present

## 2015-05-17 DIAGNOSIS — R5383 Other fatigue: Secondary | ICD-10-CM | POA: Diagnosis not present

## 2015-05-17 DIAGNOSIS — R0602 Shortness of breath: Secondary | ICD-10-CM | POA: Diagnosis not present

## 2015-05-17 DIAGNOSIS — F418 Other specified anxiety disorders: Secondary | ICD-10-CM | POA: Diagnosis not present

## 2015-05-17 DIAGNOSIS — R9431 Abnormal electrocardiogram [ECG] [EKG]: Secondary | ICD-10-CM | POA: Diagnosis not present

## 2015-05-22 DIAGNOSIS — I208 Other forms of angina pectoris: Secondary | ICD-10-CM | POA: Diagnosis not present

## 2015-05-22 DIAGNOSIS — R9431 Abnormal electrocardiogram [ECG] [EKG]: Secondary | ICD-10-CM | POA: Diagnosis not present

## 2015-05-26 ENCOUNTER — Other Ambulatory Visit: Payer: Self-pay | Admitting: Family Medicine

## 2015-06-05 DIAGNOSIS — G2581 Restless legs syndrome: Secondary | ICD-10-CM | POA: Diagnosis not present

## 2015-06-05 DIAGNOSIS — R0602 Shortness of breath: Secondary | ICD-10-CM | POA: Diagnosis not present

## 2015-06-05 DIAGNOSIS — I208 Other forms of angina pectoris: Secondary | ICD-10-CM | POA: Diagnosis not present

## 2015-06-05 DIAGNOSIS — R5383 Other fatigue: Secondary | ICD-10-CM | POA: Diagnosis not present

## 2015-06-05 DIAGNOSIS — R42 Dizziness and giddiness: Secondary | ICD-10-CM | POA: Diagnosis not present

## 2015-06-05 DIAGNOSIS — R9431 Abnormal electrocardiogram [ECG] [EKG]: Secondary | ICD-10-CM | POA: Diagnosis not present

## 2015-06-05 DIAGNOSIS — F418 Other specified anxiety disorders: Secondary | ICD-10-CM | POA: Diagnosis not present

## 2015-06-05 DIAGNOSIS — I209 Angina pectoris, unspecified: Secondary | ICD-10-CM | POA: Diagnosis not present

## 2015-06-05 DIAGNOSIS — E669 Obesity, unspecified: Secondary | ICD-10-CM | POA: Diagnosis not present

## 2015-06-20 DIAGNOSIS — F330 Major depressive disorder, recurrent, mild: Secondary | ICD-10-CM | POA: Diagnosis not present

## 2015-06-28 ENCOUNTER — Ambulatory Visit (INDEPENDENT_AMBULATORY_CARE_PROVIDER_SITE_OTHER): Payer: Medicare Other | Admitting: Family Medicine

## 2015-06-28 ENCOUNTER — Encounter: Payer: Self-pay | Admitting: Family Medicine

## 2015-06-28 VITALS — BP 138/96 | HR 88 | Temp 98.8°F | Resp 16 | Ht 66.25 in | Wt 217.3 lb

## 2015-06-28 DIAGNOSIS — Z1231 Encounter for screening mammogram for malignant neoplasm of breast: Secondary | ICD-10-CM

## 2015-06-28 DIAGNOSIS — E038 Other specified hypothyroidism: Secondary | ICD-10-CM | POA: Diagnosis not present

## 2015-06-28 DIAGNOSIS — M1990 Unspecified osteoarthritis, unspecified site: Secondary | ICD-10-CM | POA: Insufficient documentation

## 2015-06-28 DIAGNOSIS — Z0000 Encounter for general adult medical examination without abnormal findings: Secondary | ICD-10-CM

## 2015-06-28 DIAGNOSIS — R739 Hyperglycemia, unspecified: Secondary | ICD-10-CM

## 2015-06-28 DIAGNOSIS — F321 Major depressive disorder, single episode, moderate: Secondary | ICD-10-CM | POA: Diagnosis not present

## 2015-06-28 DIAGNOSIS — L989 Disorder of the skin and subcutaneous tissue, unspecified: Secondary | ICD-10-CM

## 2015-06-28 DIAGNOSIS — Z23 Encounter for immunization: Secondary | ICD-10-CM | POA: Diagnosis not present

## 2015-06-28 DIAGNOSIS — Z79899 Other long term (current) drug therapy: Secondary | ICD-10-CM

## 2015-06-28 DIAGNOSIS — Z124 Encounter for screening for malignant neoplasm of cervix: Secondary | ICD-10-CM

## 2015-06-28 DIAGNOSIS — M810 Age-related osteoporosis without current pathological fracture: Secondary | ICD-10-CM

## 2015-06-28 DIAGNOSIS — R7309 Other abnormal glucose: Secondary | ICD-10-CM

## 2015-06-28 DIAGNOSIS — E669 Obesity, unspecified: Secondary | ICD-10-CM | POA: Insufficient documentation

## 2015-06-28 DIAGNOSIS — R5383 Other fatigue: Secondary | ICD-10-CM | POA: Diagnosis not present

## 2015-06-28 DIAGNOSIS — R634 Abnormal weight loss: Secondary | ICD-10-CM

## 2015-06-28 DIAGNOSIS — Z1159 Encounter for screening for other viral diseases: Secondary | ICD-10-CM

## 2015-06-28 DIAGNOSIS — R202 Paresthesia of skin: Secondary | ICD-10-CM | POA: Insufficient documentation

## 2015-06-28 DIAGNOSIS — D692 Other nonthrombocytopenic purpura: Secondary | ICD-10-CM

## 2015-06-28 DIAGNOSIS — H9190 Unspecified hearing loss, unspecified ear: Secondary | ICD-10-CM | POA: Insufficient documentation

## 2015-06-28 MED ORDER — ASPIRIN EC 81 MG PO TBEC: 81.0000 mg | DELAYED_RELEASE_TABLET | Freq: Every day | ORAL | Status: DC

## 2015-06-28 NOTE — Progress Notes (Signed)
Name: Natalie [name]   MRN: [id number]    DOB: [birth year]   Date:06/28/2015       Progress Note  Subjective  Chief Complaint  Chief Complaint  Patient presents with  . Annual Exam  . Orders    mammogram & dexa  . Skin Problem    right knee  . Numbness    fingers and outside of feet  . Hip Pain    right hip pain  . Leg Problem    patient stated that she has been having intermittent burning on her right thigh  . Labs Only  . Immunizations    pcv & shingles    HPI  Functional ability/safety issues: No Issues Hearing issues: Addressed - seen by ENT not significant for hearing aid Activities of daily living: Discussed Home safety issues: No Issues  End Of Life Planning: Offered verbal information regarding advanced directives, healthcare power of attorney.  Preventative care, Health maintenance, Preventative health measures discussed.  Preventative screenings discussed today: lab work, colonoscopy,  mammogram, DEXA.  Low Dose CT Chest recommended if Age 90-80 years, 30 pack-year currently smoking OR have quit w/in 15years.   Lifestyle risk factor issued reviewed: Diet, exercise, weight management, advised patient smoking is not healthy, nutrition/diet.  Preventative health measures discussed (5-10 year plan).  Reviewed and recommended vaccinations: - Pneumovax  - Prevnar  - Annual Influenza - Zostavax - Tdap   Depression screening: Done Fall risk screening: Done Discuss ADLs/IADLs: Done  Current medical providers: See HPI  Other health risk factors identified this visit: No other issues Cognitive impairment issues: None identified  All above discussed with patient. Appropriate education, counseling and referral will be made based upon the above.   Hypothyroidism: not checked in a long time, she is taking medication as prescribed, she has dry skin, no constipation  Morbid obesity: she has lost over 20 lbs since last visit, she states she has changed her diet  and is actively trying to lose weight  Paresthesia: she states that she had an MRI a long time ago that showed some damage from old migraines, she states she has paresthesias on both feet and sometimes on left hand. She is right hand dominant.   Major Depression: sees Dr. Thurmond Butts, she denies side effects of medication, no suicidal thoughts of ideation.   Chronic right hip pain/OA: she states usually pain going up and down stairs. Aching pain and sometimes has a burning sensation on right lateral thigh.   Patient Active Problem List   Diagnosis Date Noted  . Elevated blood sugar 06/28/2015  . Difficulty hearing 06/28/2015  . Arthritis, degenerative 06/28/2015  . Morbid obesity ([place]) 06/28/2015  . Paresthesia 06/28/2015  . Purpura, nonthrombopenic ([place]) 06/28/2015  . Other specified hypothyroidism 08/09/2014  . Migraine without aura and without status migrainosus, not intractable 09/19/2008  . Moderate major depression ([place]) 09/07/2006  . OP (osteoporosis) 09/07/2006    Past Surgical History  Procedure Laterality Date  . Ablation  2006    Family History  Problem Relation Age of Onset  . Early death Father     suicide  . Depression Father   . Diabetes Father   . Cancer Father     prostate  . Hearing loss Father     due to war  . Alzheimer's disease Mother   . Heart disease Brother   . Heart disease Maternal Aunt   . Dementia Maternal Aunt   . Heart disease Maternal Uncle   . Dementia Maternal  Grandmother   . Heart disease Brother     Social History   Social History  . Marital Status: Single    Spouse Name: N/A  . Number of Children: 1  . Years of Education: N/A   Occupational History  . Not on file.   Social History Main Topics  . Smoking status: Former Research scientist (life sciences)  . Smokeless tobacco: Not on file     Comment: patient has not used tobacco products in 59yrs  . Alcohol Use: 0.0 oz/week    0 Standard drinks or equivalent per week     Comment: very seldom; socially   . Drug Use: No  . Sexual Activity: No   Other Topics Concern  . Not on file   Social History Narrative     Current outpatient prescriptions:  .  buPROPion (WELLBUTRIN SR) 150 MG 12 hr tablet, Take 150 mg by mouth 2 (two) times daily., Disp: , Rfl:  .  citalopram (CELEXA) 40 MG tablet, Take 40 mg by mouth daily., Disp: , Rfl:  .  levothyroxine (SYNTHROID, LEVOTHROID) 75 MCG tablet, TAKE 1 TABLET BY MOUTH EVERY DAY, Disp: 30 tablet, Rfl: 3 .  meloxicam (MOBIC) 7.5 MG tablet, TAKE 1 TABLET BY MOUTH TWICE A DAY, Disp: 60 tablet, Rfl: 1 .  SUMAtriptan (IMITREX) 100 MG tablet, Take 100 mg by mouth every 2 (two) hours as needed for migraine. May repeat in 2 hours if headache persists or recurs., Disp: , Rfl:  .  topiramate (TOPAMAX) 25 MG tablet, Take 25 mg by mouth 2 (two) times daily., Disp: , Rfl:  .  aspirin EC 81 MG tablet, Take 1 tablet (81 mg total) by mouth daily., Disp: 30 tablet, Rfl: 0  Allergies  Allergen Reactions  . Sulfur Hives  . Sulfamethoxazole-Trimethoprim      ROS  Constitutional: Negative for fever, positive for weight change.  Respiratory: Negative for cough and shortness of breath.   Cardiovascular: Negative for chest pain or palpitations.  Gastrointestinal: Negative for abdominal pain, no bowel changes.  Musculoskeletal:Positive for gait problem no joint swelling.  Skin: Negative for rash.  Neurological: Negative for dizziness or headache.  No other specific complaints in a complete review of systems (except as listed in HPI above).  Objective  Filed Vitals:   06/28/15 1424 06/28/15 1425  BP: 122/84 138/96  Pulse:  88  Temp:  98.8 F (37.1 C)  TempSrc:  Oral  Resp:  16  Height:  5\' 5"  (1.651 m)  Weight:  217 lb 4.8 oz (98.567 kg)  SpO2:  95%    Body mass index is 36.16 kg/(m^2).  Physical Exam  Constitutional: Patient appears well-developed and well-nourished.Obese  No distress.  HENT: Head: Normocephalic and atraumatic. Ears: B TMs ok, no  erythema or effusion; Nose: Nose normal. Mouth/Throat: Oropharynx is clear and moist. No oropharyngeal exudate.  Eyes: Conjunctivae and EOM are normal. Pupils are equal, round, and reactive to light. No scleral icterus.  Neck: Normal range of motion. Neck supple. No JVD present. No thyromegaly present.  Cardiovascular: Normal rate, regular rhythm and normal heart sounds.  No murmur heard. No BLE edema. Pulmonary/Chest: Effort normal and breath sounds normal. No respiratory distress. Abdominal: Soft. Bowel sounds are normal, no distension. There is no tenderness. no masses Breast: no lumps or masses, no nipple discharge or rashes FEMALE GENITALIA:  External genitalia normal External urethra normal Vaginal vault atrophic Cervix normal without discharge or lesions Bimanual exam normal without masses RECTAL: not done Musculoskeletal: Normal range  of motion, no joint effusions. No gross deformities Neurological: he is alert and oriented to person, place, and time. No cranial nerve deficit. Coordination, balance, strength, speech and gait are normal.  Skin: Skin is warm and dry. Contact dermatitis on foot, and corn/callus formation, purpura on left arm Psychiatric: Patient has a normal mood and affect. behavior is normal. Judgment and thought content normal.  PHQ2/9: Depression screen [facility] 2/9 06/28/2015 10/31/2014 10/26/2014 08/09/2014  Decreased Interest 0 0 0 1  Down, Depressed, Hopeless 0 0 0 0  PHQ - 2 Score 0 0 0 1    Fall Risk: Fall Risk  06/28/2015 10/31/2014 10/26/2014 08/09/2014  Falls in the past year? Yes Yes Yes No  Number falls in past yr: 1 1 1  -  Injury with Fall? Yes No No -  Risk for fall due to : - Other (Comment) Other (Comment) -  Risk for fall due to (comments): - slipped on murphy oil slipped on murphy oil wax -     Functional Status Survey: Is the patient deaf or have difficulty hearing?: No Does the patient have difficulty seeing, even when wearing glasses/contacts?: Yes  (questions cataract in left eye) Does the patient have difficulty concentrating, remembering, or making decisions?: No Does the patient have difficulty walking or climbing stairs?: Yes (due to right hip pain) Does the patient have difficulty dressing or bathing?: No Does the patient have difficulty doing errands alone such as visiting a doctor's office or shopping?: No    Assessment & Plan  1. Welcome to Medicare preventive visit  Discussed importance of 150 minutes of physical activity weekly, eat two servings of fish weekly, eat one serving of tree nuts ( cashews, pistachios, pecans, almonds.Marland Kitchen) every other day, eat 6 servings of fruit/vegetables daily and drink plenty of water and avoid sweet beverages.   2. Other specified hypothyroidism  - TSH  3. OP (osteoporosis)  - VITAMIN D 25 Hydroxy (Vit-D Deficiency, Fractures) - DG Bone Density; Future  4. Elevated blood sugar  Check glucose  5. Moderate major depression ([place])  Continue medication and follow up with Dr. Thurmond Butts  6. Morbid obesity, unspecified obesity type ([place])  - Lipid panel  7. Weight loss  Check labs  8. Need for hepatitis C screening test  - Hepatitis C antibody  9. Other fatigue  - VITAMIN D 25 Hydroxy (Vit-D Deficiency, Fractures)  10. Long-term use of high-risk medication  - Comprehensive metabolic panel - CBC with Differential/Platelet  11. Need for Streptococcus pneumoniae vaccination  - Pneumococcal polysaccharide vaccine 23-valent greater than or equal to 2yo subcutaneous/IM  12. Need for shingles vaccine  patient refused - not given  13. Encounter for screening mammogram for breast cancer  - MM Digital Screening; Future  14. Encounter for screening for cervical cancer   - PapLb, HPV, rfx16/18  15. Paresthesia  - Vitamin B12  16. Purpura, nonthrombopenic ([place])   17. Skin lesion of right leg  She has a hypochromic patch on right popliteal fossa laterally Return for  biopsy

## 2015-06-29 DIAGNOSIS — B9729 Other coronavirus as the cause of diseases classified elsewhere: Secondary | ICD-10-CM | POA: Diagnosis not present

## 2015-06-29 DIAGNOSIS — Z124 Encounter for screening for malignant neoplasm of cervix: Secondary | ICD-10-CM | POA: Diagnosis not present

## 2015-06-29 DIAGNOSIS — B977 Papillomavirus as the cause of diseases classified elsewhere: Secondary | ICD-10-CM | POA: Diagnosis not present

## 2015-06-29 DIAGNOSIS — R888 Abnormal findings in other body fluids and substances: Secondary | ICD-10-CM | POA: Diagnosis not present

## 2015-06-29 LAB — COMPREHENSIVE METABOLIC PANEL
ALT: 14 IU/L (ref 0–32)
AST: 17 IU/L (ref 0–40)
Albumin/Globulin Ratio: 1.7 (ref 1.2–2.2)
Albumin: 4.3 g/dL (ref 3.6–4.8)
Alkaline Phosphatase: 98 IU/L (ref 39–117)
BUN/Creatinine Ratio: 16 (ref 12–28)
BUN: 12 mg/dL (ref 8–27)
Bilirubin Total: 0.6 mg/dL (ref 0.0–1.2)
CO2: 22 mmol/L (ref 18–29)
Calcium: 9.3 mg/dL (ref 8.7–10.3)
Chloride: 101 mmol/L (ref 96–106)
Creatinine, Ser: 0.73 mg/dL (ref 0.57–1.00)
GFR calc Af Amer: 100 mL/min/{1.73_m2} (ref >59)
GFR calc non Af Amer: 87 mL/min/{1.73_m2} (ref >59)
Globulin, Total: 2.6 g/dL (ref 1.5–4.5)
Glucose: 91 mg/dL (ref 65–99)
Potassium: 3.9 mmol/L (ref 3.5–5.2)
Sodium: 140 mmol/L (ref 134–144)
Total Protein: 6.9 g/dL (ref 6.0–8.5)

## 2015-06-29 LAB — LIPID PANEL
Chol/HDL Ratio: 5.6 ratio units — ABNORMAL HIGH (ref 0.0–4.4)
Cholesterol, Total: 185 mg/dL (ref 100–199)
HDL: 33 mg/dL — ABNORMAL LOW (ref >39)
LDL Calculated: 131 mg/dL — ABNORMAL HIGH (ref 0–99)
Triglycerides: 104 mg/dL (ref 0–149)
VLDL Cholesterol Cal: 21 mg/dL (ref 5–40)

## 2015-06-29 LAB — CBC WITH DIFFERENTIAL/PLATELET
Basophils Absolute: 0 10*3/uL (ref 0.0–0.2)
Basos: 0 %
EOS (ABSOLUTE): 0.2 10*3/uL (ref 0.0–0.4)
Eos: 3 %
Hematocrit: 44 % (ref 34.0–46.6)
Hemoglobin: 14.6 g/dL (ref 11.1–15.9)
Immature Grans (Abs): 0 10*3/uL (ref 0.0–0.1)
Immature Granulocytes: 0 %
Lymphocytes Absolute: 1.7 10*3/uL (ref 0.7–3.1)
Lymphs: 30 %
MCH: 30.7 pg (ref 26.6–33.0)
MCHC: 33.2 g/dL (ref 31.5–35.7)
MCV: 92 fL (ref 79–97)
Monocytes Absolute: 0.3 10*3/uL (ref 0.1–0.9)
Monocytes: 5 %
Neutrophils Absolute: 3.5 10*3/uL (ref 1.4–7.0)
Neutrophils: 62 %
Platelets: 245 10*3/uL (ref 150–379)
RBC: 4.76 x10E6/uL (ref 3.77–5.28)
RDW: 14.2 % (ref 12.3–15.4)
WBC: 5.6 10*3/uL (ref 3.4–10.8)

## 2015-06-29 LAB — VITAMIN D 25 HYDROXY (VIT D DEFICIENCY, FRACTURES): Vit D, 25-Hydroxy: 13.1 ng/mL — ABNORMAL LOW (ref 30.0–100.0)

## 2015-06-29 LAB — HEPATITIS C ANTIBODY: Hep C Virus Ab: <0.1 s/co ratio (ref 0.0–0.9)

## 2015-06-29 LAB — TSH: TSH: 2.89 u[IU]/mL (ref 0.450–4.500)

## 2015-06-29 LAB — VITAMIN B12: Vitamin B-12: 244 pg/mL (ref 211–946)

## 2015-07-03 ENCOUNTER — Other Ambulatory Visit: Payer: Self-pay | Admitting: Family Medicine

## 2015-07-03 MED ORDER — VITAMIN D (ERGOCALCIFEROL) 1.25 MG (50000 UNIT) PO CAPS: 50000.0000 [IU] | ORAL_CAPSULE | ORAL | Status: DC

## 2015-07-03 MED ORDER — B-12 1000 MCG SL SUBL: 1.0000 | SUBLINGUAL_TABLET | Freq: Every day | SUBLINGUAL | Status: DC

## 2015-07-04 ENCOUNTER — Ambulatory Visit (INDEPENDENT_AMBULATORY_CARE_PROVIDER_SITE_OTHER): Payer: Medicare Other

## 2015-07-04 DIAGNOSIS — E538 Deficiency of other specified B group vitamins: Secondary | ICD-10-CM | POA: Diagnosis not present

## 2015-07-04 MED ORDER — CYANOCOBALAMIN 1000 MCG/ML IJ SOLN
1000.0000 ug | Freq: Once | INTRAMUSCULAR | Status: DC
Start: 2015-07-04 — End: 2015-07-04

## 2015-07-04 MED ORDER — CYANOCOBALAMIN 1000 MCG/ML IJ SOLN
1000.0000 ug | Freq: Once | INTRAMUSCULAR | Status: AC
  Administered 2015-07-04: 1000 ug via INTRAMUSCULAR

## 2015-07-07 LAB — PAPLB, HPV, RFX16/18
HPV Genotype, 16: NEGATIVE
HPV Genotype, 18: NEGATIVE
HPV, high-risk: POSITIVE — AB
PAP Smear Comment: 0

## 2015-07-08 ENCOUNTER — Encounter: Payer: Self-pay | Admitting: Family Medicine

## 2015-07-08 DIAGNOSIS — IMO0002 Reserved for concepts with insufficient information to code with codable children: Secondary | ICD-10-CM | POA: Insufficient documentation

## 2015-07-13 ENCOUNTER — Telehealth: Payer: Self-pay | Admitting: Family Medicine

## 2015-07-13 NOTE — Telephone Encounter (Signed)
Tried to contact this patient to inform her of the previous messages from Dr. Ancil Boozer, but there was no answer. A message was left for her to give [place] a call when she got the chance.  Notes Recorded by Lynelle Smoke, CMA on 07/10/2015 at 1:28 PM I tried to contact this patient to review the results from her most recent labs, but there was no answer. A message was left for the patient to give [place] a call when she got the chance.   Notes Recorded by [name], MD on 07/09/2015 at 5:55 PM We did not check HPV in the past, it has likely been present for a long time and not causing any problems.  Notes Recorded by Lynelle Smoke, CMA on 07/09/2015 at 8:41 AM Patient was informed but stated that she has not has sex in 77 yrs and has always had normal PAP. Could this be in error?  Notes Recorded by [name], MD on 07/08/2015 at 6:39 PM Normal pap smear, but HPV positive , she needs to have pap smear yearly

## 2015-07-13 NOTE — Telephone Encounter (Signed)
Returning your call, pertaining to pap results. States you where going to ask Dr Ancil Boozer a question for her.

## 2015-07-24 ENCOUNTER — Other Ambulatory Visit: Payer: Self-pay | Admitting: Family Medicine

## 2015-07-24 ENCOUNTER — Ambulatory Visit
Admission: RE | Admit: 2015-07-24 | Discharge: 2015-07-24 | Disposition: A | Discharge status: Home / Self Care | Payer: Medicare Other | Source: Ambulatory Visit | Attending: Family Medicine | Admitting: Family Medicine

## 2015-07-24 DIAGNOSIS — Z1231 Encounter for screening mammogram for malignant neoplasm of breast: Secondary | ICD-10-CM

## 2015-07-24 DIAGNOSIS — M4186 Other forms of scoliosis, lumbar region: Secondary | ICD-10-CM | POA: Insufficient documentation

## 2015-07-24 DIAGNOSIS — M810 Age-related osteoporosis without current pathological fracture: Secondary | ICD-10-CM | POA: Insufficient documentation

## 2015-07-24 IMAGING — MG MM DIGITAL SCREENING BILAT W/ TOMO W/ CAD
8 of 13 series · 8 of 29 positions shown · non-contrast
Comparison: None.

CLINICAL DATA: Screening.

EXAM:
2D DIGITAL SCREENING BILATERAL MAMMOGRAM WITH CAD AND ADJUNCT TOMO

[L MLO (1 of 2)]
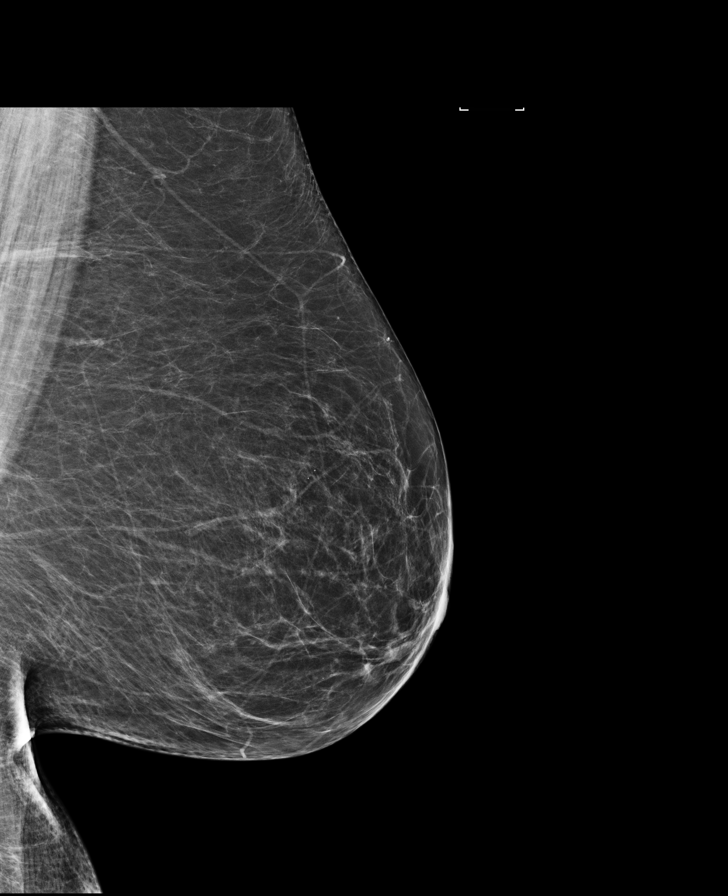

[R MLO synth-2D]
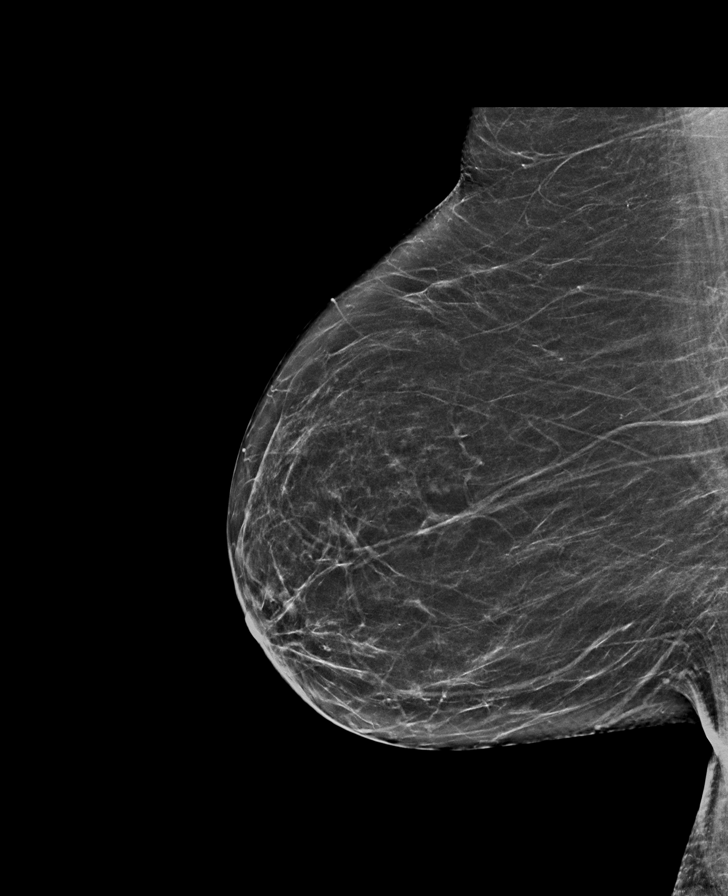

[R MLO]
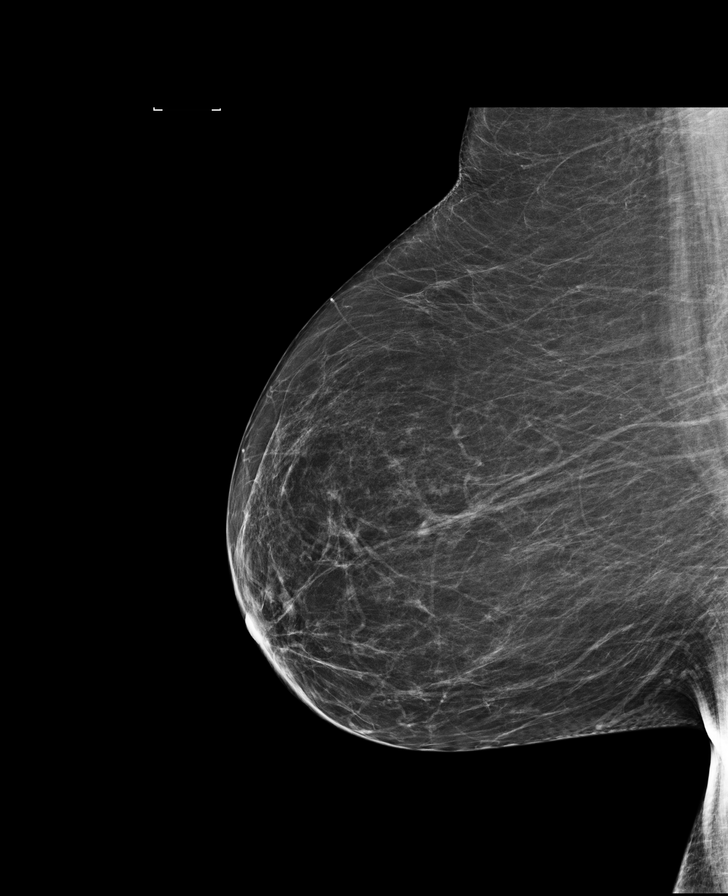

[L CC synth-2D]
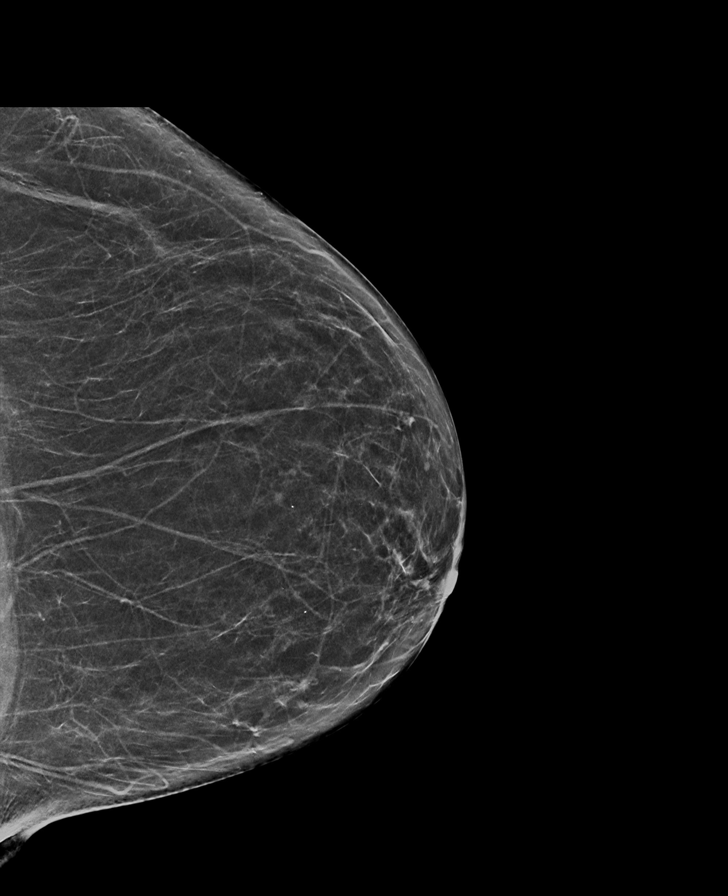

[L CC]
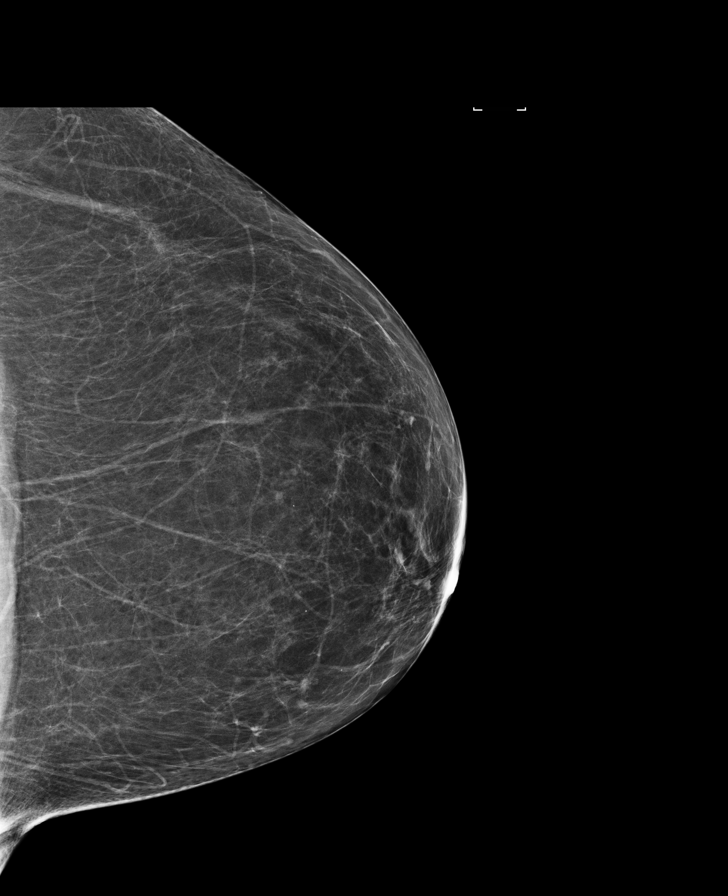

[L MLO synth-2D]
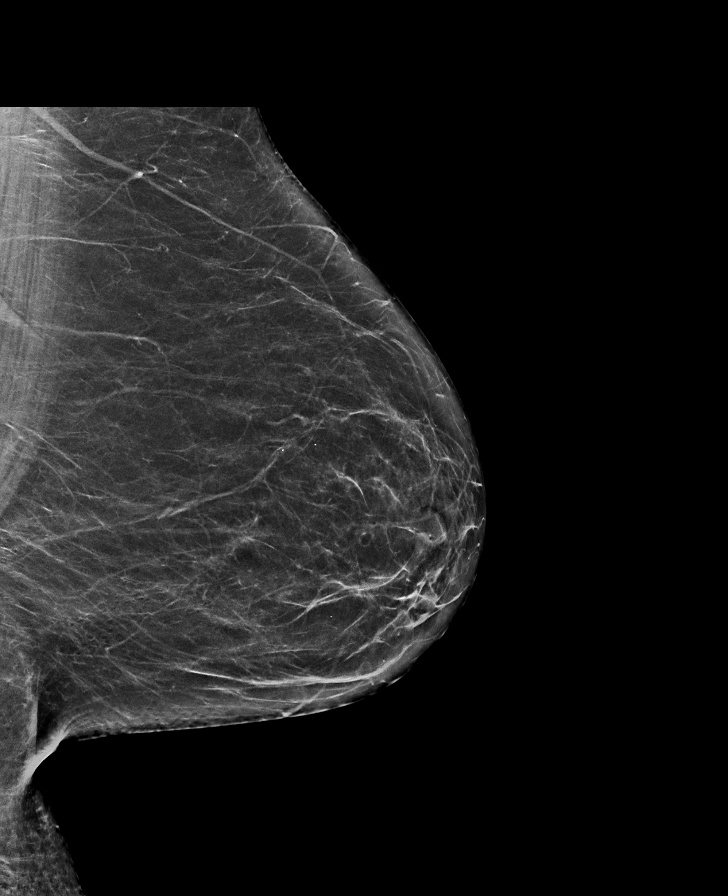

[R CC]
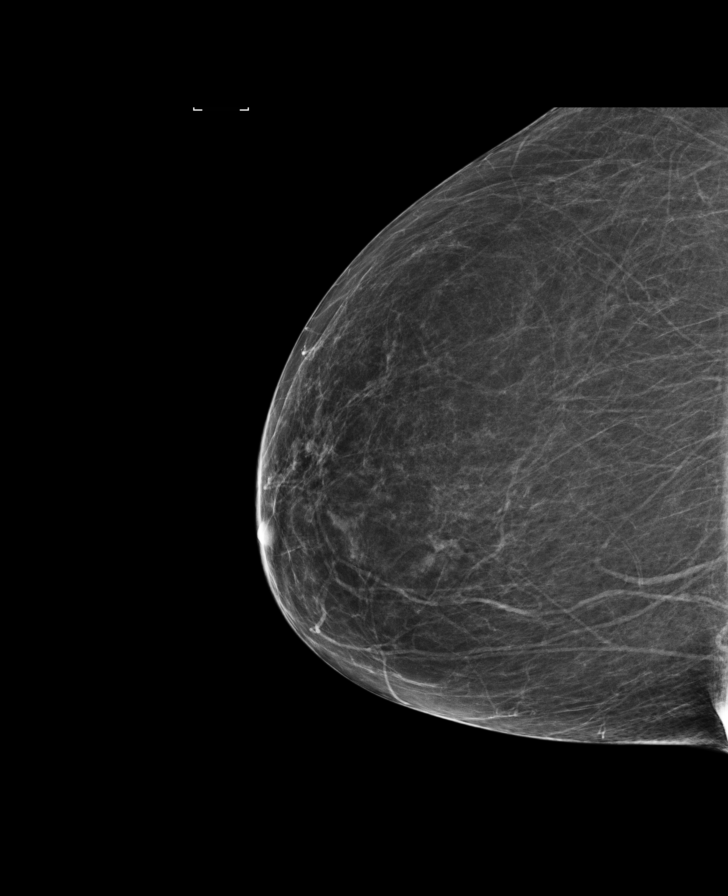

[L MLO (2 of 2)]
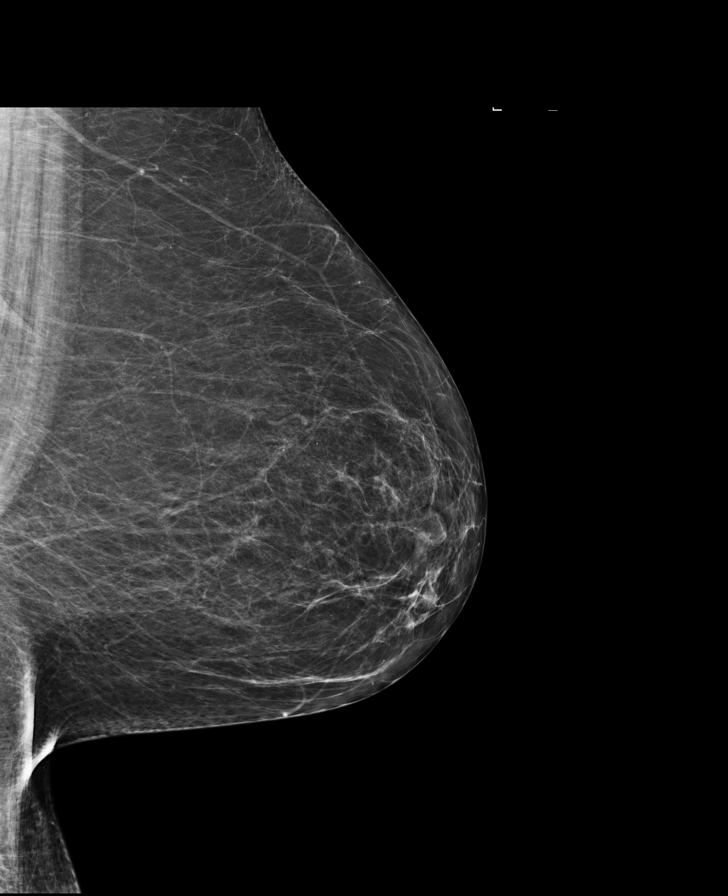

[8 of 29 positions shown; findings below may reference images not displayed]

ACR Breast Density Category b: There are scattered areas of
fibroglandular density.
FINDINGS: There are no findings suspicious for malignancy. Images were
processed with CAD.
IMPRESSION: No mammographic evidence of malignancy. A result letter of this
screening mammogram will be mailed directly to the patient.

RECOMMENDATION:
Screening mammogram in one year. (Code:[19])

BI-RADS CATEGORY  1: Negative.

## 2015-07-30 ENCOUNTER — Other Ambulatory Visit: Payer: Self-pay | Admitting: Family Medicine

## 2015-08-03 ENCOUNTER — Encounter: Payer: Self-pay | Admitting: Family Medicine

## 2015-08-03 ENCOUNTER — Ambulatory Visit (INDEPENDENT_AMBULATORY_CARE_PROVIDER_SITE_OTHER): Payer: Medicare Other | Admitting: Family Medicine

## 2015-08-03 VITALS — BP 128/90 | HR 84 | Temp 97.9°F | Resp 18 | Ht 66.0 in | Wt 215.0 lb

## 2015-08-03 DIAGNOSIS — G43009 Migraine without aura, not intractable, without status migrainosus: Secondary | ICD-10-CM

## 2015-08-03 DIAGNOSIS — F321 Major depressive disorder, single episode, moderate: Secondary | ICD-10-CM | POA: Diagnosis not present

## 2015-08-03 DIAGNOSIS — IMO0002 Reserved for concepts with insufficient information to code with codable children: Secondary | ICD-10-CM

## 2015-08-03 DIAGNOSIS — M150 Primary generalized (osteo)arthritis: Secondary | ICD-10-CM | POA: Diagnosis not present

## 2015-08-03 DIAGNOSIS — E538 Deficiency of other specified B group vitamins: Secondary | ICD-10-CM

## 2015-08-03 DIAGNOSIS — M8949 Other hypertrophic osteoarthropathy, multiple sites: Secondary | ICD-10-CM

## 2015-08-03 DIAGNOSIS — R888 Abnormal findings in other body fluids and substances: Secondary | ICD-10-CM | POA: Diagnosis not present

## 2015-08-03 DIAGNOSIS — B977 Papillomavirus as the cause of diseases classified elsewhere: Secondary | ICD-10-CM

## 2015-08-03 DIAGNOSIS — M159 Polyosteoarthritis, unspecified: Secondary | ICD-10-CM

## 2015-08-03 DIAGNOSIS — E785 Hyperlipidemia, unspecified: Secondary | ICD-10-CM | POA: Diagnosis not present

## 2015-08-03 DIAGNOSIS — E038 Other specified hypothyroidism: Secondary | ICD-10-CM

## 2015-08-03 DIAGNOSIS — M810 Age-related osteoporosis without current pathological fracture: Secondary | ICD-10-CM | POA: Diagnosis not present

## 2015-08-03 MED ORDER — MELOXICAM 7.5 MG PO TABS: 7.5000 mg | ORAL_TABLET | Freq: Two times a day (BID) | ORAL | Status: DC

## 2015-08-03 MED ORDER — LEVOTHYROXINE SODIUM 75 MCG PO TABS: 75.0000 ug | ORAL_TABLET | Freq: Every day | ORAL | Status: DC

## 2015-08-03 MED ORDER — CYANOCOBALAMIN 1000 MCG/ML IJ SOLN
1000.0000 ug | Freq: Once | INTRAMUSCULAR | Status: AC
  Administered 2015-08-03: 1000 ug via INTRAMUSCULAR

## 2015-08-03 NOTE — Progress Notes (Signed)
Name: Natalie [name]   MRN: [id number]    DOB: [birth year]   Date:08/03/2015       Progress Note  Subjective  Chief Complaint  Chief Complaint  Patient presents with  . Lab Results    HPV results  . Medication Refill    Patient states her Meloxicam was never filled on her last visit.    HPI  HPV positive: not sexually active, normal pap but HPV positive advised yearly pap smears  Osteoporosis: on right femur, she is fair skin, family history of osteoporosis, discussed options, she states not willing to do Forteo. She would like to hold off on all medications at this time, she wants to think about it.   B12 deficiency: she is getting monthly injections  Dyslipidemia: she does not want to start statin therapy, willing to change her diet. No chest pain .  OA: taking Meloxicam, discussed risk of bp elevation and kidney problems, she will try to wean self off and take Tylenol instead. Aching right hip, knees and also hands feels stiff when she does not take medication  Moderate Depression: still sees Dr. Thurmond Butts, states mood has been stable, compliant with medication  Migraine headaches: she is on Topamax and is doing well, episodes about once or twice monthly, pain is described as right temporal or parietal area, piercing like and at times aching, takes Imitrex and resolves symptoms within 2 hours.   Hypothyroidism: TSH is at goal, she has chronic dry skin, no constipation   Patient Active Problem List   Diagnosis Date Noted  . HPV test positive 07/08/2015  . Elevated blood sugar 06/28/2015  . Difficulty hearing 06/28/2015  . Arthritis, degenerative 06/28/2015  . Morbid obesity ([place]) 06/28/2015  . Paresthesia 06/28/2015  . Purpura, nonthrombopenic ([place]) 06/28/2015  . Skin lesion of right leg 06/28/2015  . Other specified hypothyroidism 08/09/2014  . Migraine without aura and without status migrainosus, not intractable 09/19/2008  . Moderate major depression ([place]) 09/07/2006  .  OP (osteoporosis) 09/07/2006    Past Surgical History  Procedure Laterality Date  . Ablation  2006    Family History  Problem Relation Age of Onset  . Early death Father     suicide  . Depression Father   . Diabetes Father   . Cancer Father     prostate  . Hearing loss Father     due to war  . Alzheimer's disease Mother   . Heart disease Brother   . Heart disease Maternal Aunt   . Dementia Maternal Aunt   . Heart disease Maternal Uncle   . Dementia Maternal Grandmother   . Heart disease Brother     Social History   Social History  . Marital Status: Single    Spouse Name: N/A  . Number of Children: 1  . Years of Education: N/A   Occupational History  . Not on file.   Social History Main Topics  . Smoking status: Former Research scientist (life sciences)  . Smokeless tobacco: Not on file     Comment: patient has not used tobacco products in 31yrs  . Alcohol Use: 0.0 oz/week    0 Standard drinks or equivalent per week     Comment: very seldom; socially  . Drug Use: No  . Sexual Activity: No   Other Topics Concern  . Not on file   Social History Narrative     Current outpatient prescriptions:  .  aspirin EC 81 MG tablet, Take 1 tablet (81 mg total) by mouth  daily., Disp: 30 tablet, Rfl: 0 .  buPROPion (WELLBUTRIN SR) 150 MG 12 hr tablet, Take 150 mg by mouth 2 (two) times daily., Disp: , Rfl:  .  citalopram (CELEXA) 40 MG tablet, Take 40 mg by mouth daily., Disp: , Rfl:  .  levothyroxine (SYNTHROID, LEVOTHROID) 75 MCG tablet, Take 1 tablet (75 mcg total) by mouth daily., Disp: 30 tablet, Rfl: 3 .  meloxicam (MOBIC) 7.5 MG tablet, Take 1 tablet (7.5 mg total) by mouth 2 (two) times daily., Disp: 60 tablet, Rfl: 1 .  SUMAtriptan (IMITREX) 100 MG tablet, Take 100 mg by mouth every 2 (two) hours as needed for migraine. May repeat in 2 hours if headache persists or recurs., Disp: , Rfl:  .  topiramate (TOPAMAX) 25 MG tablet, Take 25 mg by mouth 2 (two) times daily., Disp: , Rfl:  .  Vitamin  D, Ergocalciferol, (DRISDOL) 50000 units CAPS capsule, Take 1 capsule (50,000 Units total) by mouth every 7 (seven) days., Disp: 12 capsule, Rfl: 0  Allergies  Allergen Reactions  . Sulfur Hives  . Sulfamethoxazole-Trimethoprim      ROS  Constitutional: Negative for fever or significant weight change.  Respiratory: Negative for cough and shortness of breath.   Cardiovascular: Negative for chest pain or palpitations.  Gastrointestinal: Negative for abdominal pain, no bowel changes.  Musculoskeletal: Negative for gait problem or joint swelling.  Skin: Negative for rash.  Neurological: Negative for dizziness or headache.  No other specific complaints in a complete review of systems (except as listed in HPI above). Objective  Filed Vitals:   08/03/15 1018  BP: 128/90  Pulse: 84  Temp: 97.9 F (36.6 C)  TempSrc: Oral  Resp: 18  Height: 5\' 6"  (1.676 m)  Weight: 215 lb (97.523 kg)  SpO2: 93%    Body mass index is 34.72 kg/(m^2).  Physical Exam  Constitutional: Patient appears well-developed and well-nourished. Obese  No distress.  HEENT: head atraumatic, normocephalic, pupils equal and reactive to light,  neck supple, throat within normal limits Cardiovascular: Normal rate, regular rhythm and normal heart sounds.  No murmur heard. No BLE edema. Pulmonary/Chest: Effort normal and breath sounds normal. No respiratory distress. Abdominal: Soft.  There is no tenderness. Psychiatric: Patient has a normal mood and affect. behavior is normal. Judgment and thought content normal. Muscular Skeletal: grinding on right knee  Recent Results (from the past 2160 hour(s))  Lipid panel     Status: Abnormal   Collection Time: 06/28/15  4:00 PM  Result Value Ref Range   Cholesterol, Total 185 100 - 199 mg/dL   Triglycerides 104 0 - 149 mg/dL   HDL 33 (L) >39 mg/dL   VLDL Cholesterol Cal 21 5 - 40 mg/dL   LDL Calculated 131 (H) 0 - 99 mg/dL   Chol/HDL Ratio 5.6 (H) 0.0 - 4.4 ratio units     Comment:                                   T. Chol/HDL Ratio                                             Men  Women  1/2 Avg.Risk  3.4    3.3                                   Avg.Risk  5.0    4.4                                2X Avg.Risk  9.6    7.1                                3X Avg.Risk 23.4   11.0   Hepatitis C antibody     Status: None   Collection Time: 06/28/15  4:00 PM  Result Value Ref Range   Hep C Virus Ab <0.1 0.0 - 0.9 s/co ratio    Comment:                                   Negative:     < 0.8                              Indeterminate: 0.8 - 0.9                                   Positive:     > 0.9  The CDC recommends that a positive HCV antibody result  be followed up with a HCV Nucleic Acid Amplification  test [id number]).   VITAMIN D 25 Hydroxy (Vit-D Deficiency, Fractures)     Status: Abnormal   Collection Time: 06/28/15  4:00 PM  Result Value Ref Range   Vit D, 25-Hydroxy 13.1 (L) 30.0 - 100.0 ng/mL    Comment: Vitamin D deficiency has been defined by the [place] practice guideline as a level of serum 25-OH vitamin D less than 20 ng/mL (1,2). The Endocrine Society went on to further define vitamin D insufficiency as a level between 21 and 29 ng/mL (2). 1. IOM (Institute of Medicine). 2010. Dietary reference    intakes for calcium and D. [place]: The    [employer]. 2. Holick MF, Binkley Irrigon, Bischoff-Ferrari HA, et al.    Evaluation, treatment, and prevention of vitamin D    deficiency: an Endocrine Society clinical practice    guideline. JCEM. 2011 Jul; 96(7):1911-30.   TSH     Status: None   Collection Time: 06/28/15  4:00 PM  Result Value Ref Range   TSH 2.890 0.450 - 4.500 uIU/mL  Comprehensive metabolic panel     Status: None   Collection Time: 06/28/15  4:00 PM  Result Value Ref Range   Glucose 91 65 - 99 mg/dL   BUN 12 8 - 27 mg/dL   Creatinine, Ser 0.73  0.57 - 1.00 mg/dL   GFR calc non Af Amer 87 >59 mL/min/1.73   GFR calc Af Amer 100 >59 mL/min/1.73   BUN/Creatinine Ratio 16 12 - 28   Sodium 140 134 - 144 mmol/L   Potassium 3.9 3.5 - 5.2 mmol/L   Chloride 101 96 - 106 mmol/L   CO2 22 18 - 29 mmol/L   Calcium 9.3 8.7 -  10.3 mg/dL   Total Protein 6.9 6.0 - 8.5 g/dL   Albumin 4.3 3.6 - 4.8 g/dL   Globulin, Total 2.6 1.5 - 4.5 g/dL   Albumin/Globulin Ratio 1.7 1.2 - 2.2   Bilirubin Total 0.6 0.0 - 1.2 mg/dL   Alkaline Phosphatase 98 39 - 117 IU/L   AST 17 0 - 40 IU/L   ALT 14 0 - 32 IU/L  CBC with Differential/Platelet     Status: None   Collection Time: 06/28/15  4:00 PM  Result Value Ref Range   WBC 5.6 3.4 - 10.8 x10E3/uL   RBC 4.76 3.77 - 5.28 x10E6/uL   Hemoglobin 14.6 11.1 - 15.9 g/dL   Hematocrit 44.0 34.0 - 46.6 %   MCV 92 79 - 97 fL   MCH 30.7 26.6 - 33.0 pg   MCHC 33.2 31.5 - 35.7 g/dL   RDW 14.2 12.3 - 15.4 %   Platelets 245 150 - 379 x10E3/uL   Neutrophils 62 %   Lymphs 30 %   Monocytes 5 %   Eos 3 %   Basos 0 %   Neutrophils Absolute 3.5 1.4 - 7.0 x10E3/uL   Lymphocytes Absolute 1.7 0.7 - 3.1 x10E3/uL   Monocytes Absolute 0.3 0.1 - 0.9 x10E3/uL   EOS (ABSOLUTE) 0.2 0.0 - 0.4 x10E3/uL   Basophils Absolute 0.0 0.0 - 0.2 x10E3/uL   Immature Granulocytes 0 %   Immature Grans (Abs) 0.0 0.0 - 0.1 x10E3/uL  Vitamin B12     Status: None   Collection Time: 06/28/15  4:00 PM  Result Value Ref Range   Vitamin B-12 244 211 - 946 pg/mL  PapLb, HPV, rfx16/18     Status: Abnormal   Collection Time: 06/29/15 12:00 AM  Result Value Ref Range   DIAGNOSIS: Comment     Comment: NEGATIVE FOR INTRAEPITHELIAL LESION AND MALIGNANCY.   Specimen adequacy: Comment     Comment: Satisfactory for evaluation. Endocervical and/or squamous metaplastic cells (endocervical component) are present.    CLINICIAN PROVIDED ICD10: Comment     Comment: Z12.4   Performed by: Comment     Comment: Clyde Canterbury, Cytotechnologist (ASCP)   PAP  SMEAR COMMENT .    Note: Comment     Comment: The Pap smear is a screening test designed to aid in the detection of premalignant and malignant conditions of the uterine cervix.  It is not a diagnostic procedure and should not be used as the sole means of detecting cervical cancer.  Both false-positive and false-negative reports do occur.    HPV, high-risk Positive (A) Negative    Comment: This high-risk HPV test detects thirteen high-risk types (16/18/31/33/35/39/45/51/52/56/58/59/68) without differentiation.    HPV Genotype, 16 Negative Negative   HPV Genotype, 18 Negative Negative      PHQ2/9: Depression screen [facility] 2/9 08/03/2015 06/28/2015 10/31/2014 10/26/2014 08/09/2014  Decreased Interest 0 0 0 0 1  Down, Depressed, Hopeless 0 0 0 0 0  PHQ - 2 Score 0 0 0 0 1    Fall Risk: Fall Risk  08/03/2015 06/28/2015 10/31/2014 10/26/2014 08/09/2014  Falls in the past year? Yes Yes Yes Yes No  Number falls in past yr: 1 1 1 1  -  Injury with Fall? Yes Yes No No -  Risk for fall due to : - - Other (Comment) Other (Comment) -  Risk for fall due to (comments): - - slipped on murphy oil slipped on murphy oil wax -     Functional Status Survey: Is  the patient deaf or have difficulty hearing?: No Does the patient have difficulty seeing, even when wearing glasses/contacts?: Yes (questions cataract in left eye) Does the patient have difficulty concentrating, remembering, or making decisions?: No Does the patient have difficulty walking or climbing stairs?: Yes (due to right hip pain) Does the patient have difficulty dressing or bathing?: No Does the patient have difficulty doing errands alone such as visiting a doctor's office or shopping?: No    Assessment & Plan  1. Other specified hypothyroidism  - levothyroxine (SYNTHROID, LEVOTHROID) 75 MCG tablet; Take 1 tablet (75 mcg total) by mouth daily.  Dispense: 30 tablet; Refill: 3  2. Moderate major depression ([place])  Continue follow up with  Dr. Thurmond Butts  3. Vitamin B12 deficiency  -B12 injection given today  4. Dyslipidemia  Lipid panel shows low HDL : to improve HDL patient  needs to eat tree nuts ( pecans/pistachios/almonds ) four times weekly, eat fish two times weekly  and exercise  at least 150 minutes per week  5. Primary osteoarthritis involving multiple joints  - meloxicam (MOBIC) 7.5 MG tablet; Take 1 tablet (7.5 mg total) by mouth 2 (two) times daily.  Dispense: 60 tablet; Refill: 1  6. OP (osteoporosis)  She does not want to start medications at this time  7. HPV test positive  Repeat pap yearly   8. Migraine without aura and without status migrainosus, not intractable  stable

## 2015-08-03 NOTE — Patient Instructions (Signed)
Lipid panel shows low HDL : to improve HDL patient  needs to eat tree nuts ( pecans/pistachios/almonds ) four times weekly, eat fish two times weekly  and exercise  at least 150 minutes per week 

## 2015-08-06 ENCOUNTER — Other Ambulatory Visit: Payer: Self-pay

## 2015-08-06 DIAGNOSIS — M159 Polyosteoarthritis, unspecified: Secondary | ICD-10-CM

## 2015-08-06 DIAGNOSIS — M8949 Other hypertrophic osteoarthropathy, multiple sites: Secondary | ICD-10-CM

## 2015-08-06 DIAGNOSIS — M150 Primary generalized (osteo)arthritis: Principal | ICD-10-CM

## 2015-08-06 NOTE — Telephone Encounter (Signed)
Patient requesting refill. 

## 2015-08-07 MED ORDER — MELOXICAM 7.5 MG PO TABS: 7.5000 mg | ORAL_TABLET | Freq: Two times a day (BID) | ORAL | Status: DC

## 2015-08-29 ENCOUNTER — Other Ambulatory Visit: Payer: Self-pay | Admitting: Family Medicine

## 2015-09-23 ENCOUNTER — Other Ambulatory Visit: Payer: Self-pay | Admitting: Family Medicine

## 2015-10-01 ENCOUNTER — Ambulatory Visit: Payer: Medicare Other | Admitting: Family Medicine

## 2015-10-03 DIAGNOSIS — F330 Major depressive disorder, recurrent, mild: Secondary | ICD-10-CM | POA: Diagnosis not present

## 2015-11-02 ENCOUNTER — Encounter: Payer: Self-pay | Admitting: Family Medicine

## 2015-11-02 ENCOUNTER — Ambulatory Visit (INDEPENDENT_AMBULATORY_CARE_PROVIDER_SITE_OTHER): Payer: Medicare Other | Admitting: Family Medicine

## 2015-11-02 VITALS — BP 132/74 | HR 76 | Temp 98.1°F | Resp 16 | Ht 66.0 in | Wt 213.2 lb

## 2015-11-02 DIAGNOSIS — M159 Polyosteoarthritis, unspecified: Secondary | ICD-10-CM

## 2015-11-02 DIAGNOSIS — L280 Lichen simplex chronicus: Secondary | ICD-10-CM | POA: Diagnosis not present

## 2015-11-02 DIAGNOSIS — L570 Actinic keratosis: Secondary | ICD-10-CM

## 2015-11-02 DIAGNOSIS — M7061 Trochanteric bursitis, right hip: Secondary | ICD-10-CM

## 2015-11-02 DIAGNOSIS — Z23 Encounter for immunization: Secondary | ICD-10-CM

## 2015-11-02 DIAGNOSIS — F321 Major depressive disorder, single episode, moderate: Secondary | ICD-10-CM

## 2015-11-02 DIAGNOSIS — M150 Primary generalized (osteo)arthritis: Secondary | ICD-10-CM | POA: Diagnosis not present

## 2015-11-02 DIAGNOSIS — G43001 Migraine without aura, not intractable, with status migrainosus: Secondary | ICD-10-CM

## 2015-11-02 DIAGNOSIS — L989 Disorder of the skin and subcutaneous tissue, unspecified: Secondary | ICD-10-CM

## 2015-11-02 DIAGNOSIS — E038 Other specified hypothyroidism: Secondary | ICD-10-CM

## 2015-11-02 DIAGNOSIS — N3941 Urge incontinence: Secondary | ICD-10-CM | POA: Diagnosis not present

## 2015-11-02 DIAGNOSIS — M8949 Other hypertrophic osteoarthropathy, multiple sites: Secondary | ICD-10-CM

## 2015-11-02 DIAGNOSIS — E538 Deficiency of other specified B group vitamins: Secondary | ICD-10-CM | POA: Diagnosis not present

## 2015-11-02 DIAGNOSIS — E785 Hyperlipidemia, unspecified: Secondary | ICD-10-CM | POA: Diagnosis not present

## 2015-11-02 MED ORDER — CYANOCOBALAMIN 1000 MCG/ML IJ SOLN
1000.0000 ug | Freq: Once | INTRAMUSCULAR | Status: AC
  Administered 2015-11-02: 1000 ug via INTRAMUSCULAR

## 2015-11-02 MED ORDER — LIDOCAINE HCL (PF) 1 % IJ SOLN
2.0000 mL | Freq: Once | INTRAMUSCULAR | Status: AC
  Administered 2015-11-02: 2 mL via INTRADERMAL

## 2015-11-02 NOTE — Progress Notes (Signed)
Name: Natalie [name]   MRN: [id number]    DOB: 05/04/1950   Date:11/02/2015       Progress Note  Subjective  Chief Complaint  Chief Complaint  Patient presents with  . Medication Refill    3 month F/U  . Migraine    Patient is having a active migraine today that started last night and is complaining of frontal face pressure and fatigue. Patient is every 2 every month and seem to be worsen to the point she has not gone to work.   . Hypothyroidism    Patient has been very fatigue  . Osteoarthritis    Unchanged, but states her hips hurt so bad it hurts to walk.   . Hyperlipidemia    HPI  Skin lesion: present on right posterior calf for the past 15 years, she states growing slightly, no change in color. Advised shave biopsy to rule out cancer, but she refuses at this time  Urinary urgency: she states she has episodes of severe urge incontinence with occasional accidents. She states it has been present intermittently for the past 3 years, she states symptoms were worse a few weeks ago but has improved. No hematuria or dysuria  Osteoporosis: on right femur, she is fair skin, family history of osteoporosis.  She would like to hold off on all medications at this time, she wants to think about it. At this time she has increased calcium intake in her diet  B12 deficiency: she is getting monthly injections, but skipped last months dose and she has been feeling very tired over the past month. Discussed checking TSH since she has hypothyroidism, but she would like to hold off for now.   Hypothyroidism: she has been taking medication as prescribed, she has noticed that her hair is thinning , she has chronic dry skin  Dyslipidemia: she does not want to start statin therapy, willing to change her diet. No chest pain .  OA: taking Meloxicam, she has been taking one at most daily and skips medication at times, she has daily pain but not much worse than usual. Worse symptoms is on the right outer  hips  Moderate Depression: still sees Dr. Thurmond Butts, states mood has been stable, compliant with medication  Migraine headaches: she is on Topamax but has noticed increase in frequency , she has around 3 per month now, pain is described as right temporal or parietal area, piercing like and at times aching, pain is present at this time ( 4/10)  And is a band around the top of her She has not taken Imitrex this am, usually works well for her  Trochanteric bursitis: she has noticed worsening pain on right outer hip, worse when she applies pressure on right trochanteric bursa - difficulty sleeping on right side. Discussed steroid injection, but she will increase Meloxicam for one week, taking again twice daily and if no improvement return for it.    Patient Active Problem List   Diagnosis Date Noted  . HPV test positive 07/08/2015  . Elevated blood sugar 06/28/2015  . Difficulty hearing 06/28/2015  . Arthritis, degenerative 06/28/2015  . Morbid obesity ([place]) 06/28/2015  . Paresthesia 06/28/2015  . Purpura, nonthrombopenic ([place]) 06/28/2015  . Skin lesion of right leg 06/28/2015  . Other specified hypothyroidism 08/09/2014  . Migraine without aura and without status migrainosus, not intractable 09/19/2008  . Moderate major depression ([place]) 09/07/2006  . OP (osteoporosis) 09/07/2006    Past Surgical History:  Procedure Laterality Date  . ABLATION  2006    Family History  Problem Relation Age of Onset  . Early death Father     suicide  . Depression Father   . Diabetes Father   . Cancer Father     prostate  . Hearing loss Father     due to war  . Alzheimer's disease Mother   . Heart disease Brother   . Heart disease Maternal Aunt   . Dementia Maternal Aunt   . Heart disease Maternal Uncle   . Dementia Maternal Grandmother   . Heart disease Brother     Social History   Social History  . Marital status: Single    Spouse name: N/A  . Number of children: 1  . Years of  education: N/A   Occupational History  . Not on file.   Social History Main Topics  . Smoking status: Former Research scientist (life sciences)  . Smokeless tobacco: Never Used     Comment: patient has not used tobacco products in 81yrs  . Alcohol use 0.0 oz/week     Comment: very seldom; socially  . Drug use: No  . Sexual activity: No   Other Topics Concern  . Not on file   Social History Narrative  . No narrative on file     Current Outpatient Prescriptions:  .  aspirin EC 81 MG tablet, Take 1 tablet (81 mg total) by mouth daily., Disp: 30 tablet, Rfl: 0 .  buPROPion (WELLBUTRIN SR) 150 MG 12 hr tablet, Take 150 mg by mouth 2 (two) times daily., Disp: , Rfl:  .  citalopram (CELEXA) 40 MG tablet, Take 40 mg by mouth daily., Disp: , Rfl:  .  levothyroxine (SYNTHROID, LEVOTHROID) 75 MCG tablet, Take 1 tablet (75 mcg total) by mouth daily., Disp: 30 tablet, Rfl: 3 .  meloxicam (MOBIC) 7.5 MG tablet, Take 1 tablet (7.5 mg total) by mouth 2 (two) times daily. (Patient taking differently: Take 7.5 mg by mouth as needed. ), Disp: 180 tablet, Rfl: 0 .  SUMAtriptan (IMITREX) 100 MG tablet, Take 100 mg by mouth every 2 (two) hours as needed for migraine. May repeat in 2 hours if headache persists or recurs., Disp: , Rfl:  .  topiramate (TOPAMAX) 25 MG tablet, Take 25 mg by mouth daily. , Disp: , Rfl:  .  Vitamin D, Ergocalciferol, (DRISDOL) 50000 units CAPS capsule, TAKE 1 CAPSULE (50,000 UNITS TOTAL) BY MOUTH EVERY 7 (SEVEN) DAYS., Disp: 12 capsule, Rfl: 0  Allergies  Allergen Reactions  . Sulfur Hives  . Sulfamethoxazole-Trimethoprim      ROS  Constitutional: Negative for fever or weight change.  Respiratory: Negative for cough and shortness of breath.   Cardiovascular: Negative for chest pain or palpitations.  Gastrointestinal: Negative for abdominal pain, no bowel changes.  Musculoskeletal: Positive  for gait problem ( right hip pain causes antalgic gait at times ) no  joint swelling.  Skin: Negative  for rash.  Neurological: Negative for dizziness, positive for  headache.  No other specific complaints in a complete review of systems (except as listed in HPI above).   Objective  Vitals:   11/02/15 1008  BP: 132/74  Pulse: 76  Resp: 16  Temp: 98.1 F (36.7 C)  TempSrc: Oral  SpO2: 96%  Weight: 213 lb 3.2 oz (96.7 kg)  Height: 5\' 6"  (1.676 m)    Body mass index is 34.41 kg/m.  Physical Exam  Constitutional: Patient appears well-developed and well-nourished. Obese No distress.  HEENT: head atraumatic, normocephalic, pupils equal and  reactive to light, neck supple, throat within normal limits Cardiovascular: Normal rate, regular rhythm and normal heart sounds.  No murmur heard. No BLE edema. Pulmonary/Chest: Effort normal and breath sounds normal. No respiratory distress. Abdominal: Soft.  There is no tenderness. Psychiatric: Patient has a normal mood and affect. behavior is normal. Judgment and thought content normal. Muscular Skeletal :pain during palpation of right trochanteric bursa  PHQ2/9: Depression screen [facility] 2/9 11/02/2015 08/03/2015 06/28/2015 10/31/2014 10/26/2014  Decreased Interest 0 0 0 0 0  Down, Depressed, Hopeless 0 0 0 0 0  PHQ - 2 Score 0 0 0 0 0     Fall Risk: Fall Risk  11/02/2015 08/03/2015 06/28/2015 10/31/2014 10/26/2014  Falls in the past year? Yes Yes Yes Yes Yes  Number falls in past yr: 1 1 1 1 1   Injury with Fall? No Yes Yes No No  Risk for fall due to : - - - Other (Comment) Other (Comment)  Risk for fall due to (comments): - - - slipped on murphy oil slipped on murphy oil wax     Functional Status Survey: Is the patient deaf or have difficulty hearing?: No Does the patient have difficulty seeing, even when wearing glasses/contacts?: No Does the patient have difficulty concentrating, remembering, or making decisions?: No Does the patient have difficulty walking or climbing stairs?: No Does the patient have difficulty dressing or bathing?:  No Does the patient have difficulty doing errands alone such as visiting a doctor's office or shopping?: No    Assessment & Plan  1. Other specified hypothyroidism  Continue medication   2. B12 deficiency  - cyanocobalamin ((VITAMIN B-12)) injection 1,000 mcg; Inject 1 mL (1,000 mcg total) into the muscle once.  3. Needs flu shot  - Flu vaccine HIGH DOSE PF (Fluzone High dose)  4. Moderate major depression (HCC)  Stable, sees Dr. Thurmond Butts  5. Dyslipidemia  On life style modification only   6. Primary osteoarthritis involving multiple joints  Taking Meloxicam or Tylenol to control symptoms, discussed need to stretch her hip, since it seems tight.   7. Migraine without aura and with status migrainosus, not intractable  She will take Imitrex when she gets home   8. Trochanteric bursitis of right hip  She wants to hold off on steroid injection   9. Skin lesion of right leg  Consent signed: YES  Procedure: Skin lesion  Removal Location: right calf Equipment used: derma-blade, high temperature cautery, sterile scalpel, tweezers, curved scissors Anesthesia: 1% Lidocaine w/o Epinephrine  Cleaned and prepped: Betadine  After consent signed, are of skin prepped with betadine. Lidocaine w/o epinephrine injected into skin underneath skin mass. After properly numbed sterile equipment used to remove tag.  Specimen sent for pathology analysis. Instructed on proper care to allow for proper healing. F/U for nursing visit if needed.  10. Urge incontinence  Discussed getting urine culture, PT and avoid caffeine, she wants to hold off on PT at this time  - CULTURE, URINE COMPREHENSIVE

## 2015-11-04 LAB — CULTURE, URINE COMPREHENSIVE: Organism ID, Bacteria: NO GROWTH

## 2015-12-12 ENCOUNTER — Other Ambulatory Visit: Payer: Self-pay | Admitting: Family Medicine

## 2015-12-12 NOTE — Telephone Encounter (Signed)
Patient requesting refill of Vitamin D to [facility]. 

## 2016-01-07 ENCOUNTER — Other Ambulatory Visit: Payer: Self-pay | Admitting: Family Medicine

## 2016-01-07 DIAGNOSIS — E038 Other specified hypothyroidism: Secondary | ICD-10-CM

## 2016-01-18 ENCOUNTER — Other Ambulatory Visit: Payer: Self-pay | Admitting: Family Medicine

## 2016-01-18 DIAGNOSIS — M159 Polyosteoarthritis, unspecified: Secondary | ICD-10-CM

## 2016-01-18 DIAGNOSIS — M150 Primary generalized (osteo)arthritis: Principal | ICD-10-CM

## 2016-01-18 DIAGNOSIS — M8949 Other hypertrophic osteoarthropathy, multiple sites: Secondary | ICD-10-CM

## 2016-02-29 DIAGNOSIS — F330 Major depressive disorder, recurrent, mild: Secondary | ICD-10-CM | POA: Diagnosis not present

## 2016-03-07 ENCOUNTER — Other Ambulatory Visit: Payer: Self-pay | Admitting: Family Medicine

## 2016-04-30 ENCOUNTER — Encounter: Payer: Self-pay | Admitting: Family Medicine

## 2016-04-30 ENCOUNTER — Ambulatory Visit (INDEPENDENT_AMBULATORY_CARE_PROVIDER_SITE_OTHER): Payer: Medicare Other | Admitting: Family Medicine

## 2016-04-30 VITALS — BP 116/68 | HR 94 | Temp 97.6°F | Resp 16 | Ht 66.0 in | Wt 220.2 lb

## 2016-04-30 DIAGNOSIS — M8949 Other hypertrophic osteoarthropathy, multiple sites: Secondary | ICD-10-CM

## 2016-04-30 DIAGNOSIS — M150 Primary generalized (osteo)arthritis: Secondary | ICD-10-CM

## 2016-04-30 DIAGNOSIS — E669 Obesity, unspecified: Secondary | ICD-10-CM | POA: Diagnosis not present

## 2016-04-30 DIAGNOSIS — F321 Major depressive disorder, single episode, moderate: Secondary | ICD-10-CM

## 2016-04-30 DIAGNOSIS — E785 Hyperlipidemia, unspecified: Secondary | ICD-10-CM | POA: Diagnosis not present

## 2016-04-30 DIAGNOSIS — E038 Other specified hypothyroidism: Secondary | ICD-10-CM | POA: Diagnosis not present

## 2016-04-30 DIAGNOSIS — E538 Deficiency of other specified B group vitamins: Secondary | ICD-10-CM | POA: Diagnosis not present

## 2016-04-30 DIAGNOSIS — Z79899 Other long term (current) drug therapy: Secondary | ICD-10-CM

## 2016-04-30 DIAGNOSIS — N3941 Urge incontinence: Secondary | ICD-10-CM

## 2016-04-30 DIAGNOSIS — G43001 Migraine without aura, not intractable, with status migrainosus: Secondary | ICD-10-CM

## 2016-04-30 DIAGNOSIS — M159 Polyosteoarthritis, unspecified: Secondary | ICD-10-CM

## 2016-04-30 MED ORDER — LEVOTHYROXINE SODIUM 75 MCG PO TABS: 75.0000 ug | ORAL_TABLET | Freq: Every day | ORAL | 1 refills | Status: DC

## 2016-04-30 MED ORDER — LEVOTHYROXINE SODIUM 75 MCG PO TABS: 75.0000 ug | ORAL_TABLET | Freq: Every day | ORAL | 5 refills | Status: DC

## 2016-04-30 MED ORDER — SUMATRIPTAN SUCCINATE 100 MG PO TABS: 100.0000 mg | ORAL_TABLET | ORAL | 0 refills | Status: DC | PRN

## 2016-04-30 MED ORDER — CYANOCOBALAMIN 1000 MCG/ML IJ SOLN
1000.0000 ug | Freq: Once | INTRAMUSCULAR | Status: AC
  Administered 2016-04-30: 1000 ug via INTRAMUSCULAR

## 2016-04-30 NOTE — Progress Notes (Signed)
Name: Natalie [name]   MRN: [id number]    DOB: July 31, 1950   Date:04/30/2016       Progress Note  Subjective  Chief Complaint  Chief Complaint  Patient presents with  . Hypothyroidism    6 month follow up  . Hyperlipidemia  . Migraine    HPI  Urinary urgency: we checked urine culture back in 10/2016 and it was negative. She states that symptoms started about 4 years ago, states that at times cannot make to the bathroom. She has the urgency no stress symptoms.   Osteoporosis: on right femur, she is fair skin, family history of osteoporosis.  She would like to hold off on all medications at this time, she wants to think about it. She has increased calcium in her diet  B12 deficiency: she does not come monthly for injection, advised to get SL B12 supplementation   Hypothyroidism: she has been taking medication as prescribed, she has noticed that her hair is thinning , she has chronic dry skin, no constipation  Dyslipidemia: she does not want to start statin therapy, willing to change her diet. No chest pain .  OA: taking Meloxicam, she has been taking one at most daily and skips medication at times, she has daily pain but not much worse than usual. Worse symptoms is on the right outer hips. She also has pain on knees.   Moderate Depression: still sees Dr. Thurmond Butts, states mood has been stable, compliant with medication, he requested EKG yearly and we will recheck it today  Migraine headaches: she is on Topamax she states that episodes are on average 4 in a month, usually in a row.  Pain is described as right temporal or parietal area, piercing like and at times aching  but other times feels like a band on top of her head. She states Imitrex resolves symptoms. She states sometimes radiates to neck and shoulder.   Obesity: she states she was doing well, but likes food again, she states she will resume her diet   Patient Active Problem List   Diagnosis Date Noted  . HPV test positive  07/08/2015  . Elevated blood sugar 06/28/2015  . Difficulty hearing 06/28/2015  . Arthritis, degenerative 06/28/2015  . Obesity, Class I, BMI 30-34.9 06/28/2015  . Paresthesia 06/28/2015  . Purpura, nonthrombopenic ([place]) 06/28/2015  . Skin lesion of right leg 06/28/2015  . Other specified hypothyroidism 08/09/2014  . Migraine without aura and without status migrainosus, not intractable 09/19/2008  . Moderate major depression ([place]) 09/07/2006  . OP (osteoporosis) 09/07/2006    Past Surgical History:  Procedure Laterality Date  . ABLATION  2006    Family History  Problem Relation Age of Onset  . Early death Father     suicide  . Depression Father   . Diabetes Father   . Cancer Father     prostate  . Hearing loss Father     due to war  . Alzheimer's disease Mother   . Heart disease Brother   . Heart disease Maternal Aunt   . Dementia Maternal Aunt   . Heart disease Maternal Uncle   . Dementia Maternal Grandmother   . Heart disease Brother     Social History   Social History  . Marital status: Single    Spouse name: N/A  . Number of children: 1  . Years of education: N/A   Occupational History  . 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 Smoking status: Former Research scientist (life sciences)  . Smokeless tobacco: Never Used     Comment: patient has not used tobacco products in 11yrs  . Alcohol use 0.0 oz/week     Comment: very seldom; socially  . Drug use: No  . Sexual activity: No   Other Topics Concern  . Not on file   Social History Narrative   Raised an adopted child on her own   Working part time reviewed documents     Current Outpatient Prescriptions:  .  aspirin EC 81 MG tablet, Take 1 tablet (81 mg total) by mouth daily., Disp: 30 tablet, Rfl: 0 .  buPROPion (WELLBUTRIN SR) 150 MG 12 hr tablet, Take 150 mg by mouth 2 (two) times daily., Disp: , Rfl:  .  citalopram (CELEXA) 40 MG tablet, Take 40 mg by mouth daily., Disp: , Rfl:   .  levothyroxine (SYNTHROID, LEVOTHROID) 75 MCG tablet, Take 1 tablet (75 mcg total) by mouth daily., Disp: 90 tablet, Rfl: 1 .  meloxicam (MOBIC) 7.5 MG tablet, TAKE 1 TABLET (7.5 MG TOTAL) BY MOUTH 2 (TWO) TIMES DAILY., Disp: 180 tablet, Rfl: 0 .  SUMAtriptan (IMITREX) 100 MG tablet, Take 1 tablet (100 mg total) by mouth every 2 (two) hours as needed for migraine. May repeat in 2 hours if headache persists or recurs., Disp: 10 tablet, Rfl: 0 .  topiramate (TOPAMAX) 25 MG tablet, Take 25 mg by mouth daily. , Disp: , Rfl:  .  Vitamin D, Ergocalciferol, (DRISDOL) 50000 units CAPS capsule, TAKE 1 CAPSULE (50,000 UNITS TOTAL) BY MOUTH EVERY 7 (SEVEN) DAYS., Disp: 12 capsule, Rfl: 0  Current Facility-Administered Medications:  .  cyanocobalamin ((VITAMIN B-12)) injection 1,000 mcg, 1,000 mcg, Intramuscular, Once, [name], MD  Allergies  Allergen Reactions  . Sulfur Hives  . Sulfamethoxazole-Trimethoprim      ROS  Constitutional: Negative for fever, positive for  weight change.  Respiratory: Negative for cough and shortness of breath.   Cardiovascular: Negative for chest pain or palpitations.  Gastrointestinal: Negative for abdominal pain, no bowel changes.  Musculoskeletal: Negative for gait problem or joint swelling.  Skin: Negative for rash.  Neurological: Negative for dizziness or headache.  No other specific complaints in a complete review of systems (except as listed in HPI above).  Objective  Vitals:   04/30/16 0811  BP: 116/68  Pulse: 94  Resp: 16  Temp: 97.6 F (36.4 C)  SpO2: 95%  Weight: 220 lb 4 oz (99.9 kg)  Height: 5\' 6"  (1.676 m)    Body mass index is 35.55 kg/m.  Physical Exam  Constitutional: Patient appears well-developed and well-nourished. Obese  No distress.  HEENT: head atraumatic, normocephalic, pupils equal and reactive to light, neck supple, throat within normal limits Cardiovascular: Normal rate, regular rhythm and normal heart sounds.  No  murmur heard. No BLE edema. Pulmonary/Chest: Effort normal and breath sounds normal. No respiratory distress. Abdominal: Soft.  There is no tenderness. Psychiatric: Patient has a normal mood and affect. behavior is normal. Judgment and thought content normal. Muscular Skeletal: crepitus with extension of both knees  PHQ2/9: Depression screen [facility] 2/9 04/30/2016 11/02/2015 08/03/2015 06/28/2015 10/31/2014  Decreased Interest 0 0 0 0 0  Down, Depressed, Hopeless 1 0 0 0 0  PHQ - 2 Score 1 0 0 0 0     Fall Risk: Fall Risk  04/30/2016 11/02/2015 08/03/2015 06/28/2015 10/31/2014  Falls in the past year? No Yes Yes Yes Yes  Number falls  in past yr: - 1 1 1 1   Injury with Fall? - No Yes Yes No  Risk for fall due to : - - - - Other (Comment)  Risk for fall due to (comments): - - - - slipped on murphy oil     Functional Status Survey: Is the patient deaf or have difficulty hearing?: No Does the patient have difficulty seeing, even when wearing glasses/contacts?: No Does the patient have difficulty concentrating, remembering, or making decisions?: No Does the patient have difficulty walking or climbing stairs?: No Does the patient have difficulty dressing or bathing?: No Does the patient have difficulty doing errands alone such as visiting a doctor's office or shopping?: No    Assessment & Plan  1. Other specified hypothyroidism  - levothyroxine (SYNTHROID, LEVOTHROID) 75 MCG tablet; Take 1 tablet (75 mcg total) by mouth daily.  Dispense: 30 tablet; Refill: 5 - TSH  2. B12 deficiency  - Vitamin B12 1000 mcg injection   3. Moderate major depression ([place])  Continue follow up with Dr. Thurmond Butts  4. Dyslipidemia  - EKG 12-Lead - Lipid panel  5. Primary osteoarthritis involving multiple joints  Stable, only taking Meloxicam usually once a day, to decrease risk of CKI  6. Migraine without aura and with status migrainosus, not intractable  - SUMAtriptan (IMITREX) 100 MG tablet; Take 1  tablet (100 mg total) by mouth every 2 (two) hours as needed for migraine. May repeat in 2 hours if headache persists or recurs.  Dispense: 10 tablet; Refill: 0  7. Urge incontinence  She does not want to take medication   8. Long-term use of high-risk medication  - EKG 12-Lead - negative for QT prolongation  - COMPLETE METABOLIC PANEL WITH GFR  9. Obesity, Class I, BMI 30-34.9  Discussed with the patient the risk posed by an increased BMI. Discussed importance of portion control, calorie counting and at least 150 minutes of physical activity weekly. Avoid sweet beverages and drink more water. Eat at least 6 servings of fruit and vegetables daily  - Insulin, fasting

## 2016-05-01 LAB — INSULIN, FASTING: Insulin fasting, serum: 4.5 u[IU]/mL (ref 2.0–19.6)

## 2016-05-01 LAB — COMPLETE METABOLIC PANEL WITH GFR
ALT: 14 U/L (ref 6–29)
AST: 19 U/L (ref 10–35)
Albumin: 4 g/dL (ref 3.6–5.1)
Alkaline Phosphatase: 76 U/L (ref 33–130)
BUN: 17 mg/dL (ref 7–25)
CO2: 22 mmol/L (ref 20–31)
Calcium: 9.2 mg/dL (ref 8.6–10.4)
Chloride: 102 mmol/L (ref 98–110)
Creat: 0.91 mg/dL (ref 0.50–0.99)
GFR, Est African American: 76 mL/min (ref >=60)
GFR, Est Non African American: 66 mL/min (ref >=60)
Glucose, Bld: 71 mg/dL (ref 65–99)
Potassium: 4.6 mmol/L (ref 3.5–5.3)
Sodium: 140 mmol/L (ref 135–146)
Total Bilirubin: 0.8 mg/dL (ref 0.2–1.2)
Total Protein: 7 g/dL (ref 6.1–8.1)

## 2016-05-01 LAB — LIPID PANEL
Cholesterol: 192 mg/dL (ref <200)
HDL: 44 mg/dL — ABNORMAL LOW (ref >50)
LDL Cholesterol: 135 mg/dL — ABNORMAL HIGH (ref <100)
Total CHOL/HDL Ratio: 4.4 Ratio (ref <5.0)
Triglycerides: 63 mg/dL (ref <150)
VLDL: 13 mg/dL (ref <30)

## 2016-05-01 LAB — TSH: TSH: 1.9 mIU/L

## 2016-05-30 DIAGNOSIS — F330 Major depressive disorder, recurrent, mild: Secondary | ICD-10-CM | POA: Diagnosis not present

## 2016-06-11 ENCOUNTER — Other Ambulatory Visit: Payer: Self-pay | Admitting: Family Medicine

## 2016-06-11 NOTE — Telephone Encounter (Signed)
Patient requesting refill of Vitamin D to [facility]. 

## 2016-06-25 DIAGNOSIS — F330 Major depressive disorder, recurrent, mild: Secondary | ICD-10-CM | POA: Diagnosis not present

## 2016-07-15 ENCOUNTER — Encounter: Payer: Medicare Other | Admitting: Family Medicine

## 2016-08-03 ENCOUNTER — Emergency Department: Payer: Medicare Other

## 2016-08-03 ENCOUNTER — Emergency Department
Admission: EM | Admit: 2016-08-03 | Discharge: 2016-08-03 | Disposition: A | Discharge status: Home / Self Care | Payer: Medicare Other | Attending: Emergency Medicine | Admitting: Emergency Medicine

## 2016-08-03 ENCOUNTER — Encounter: Payer: Self-pay | Admitting: Emergency Medicine

## 2016-08-03 DIAGNOSIS — Z79899 Other long term (current) drug therapy: Secondary | ICD-10-CM | POA: Insufficient documentation

## 2016-08-03 DIAGNOSIS — Y93K1 Activity, walking an animal: Secondary | ICD-10-CM | POA: Insufficient documentation

## 2016-08-03 DIAGNOSIS — S0990XA Unspecified injury of head, initial encounter: Secondary | ICD-10-CM | POA: Diagnosis not present

## 2016-08-03 DIAGNOSIS — E039 Hypothyroidism, unspecified: Secondary | ICD-10-CM | POA: Insufficient documentation

## 2016-08-03 DIAGNOSIS — Z7982 Long term (current) use of aspirin: Secondary | ICD-10-CM | POA: Diagnosis not present

## 2016-08-03 DIAGNOSIS — S01111A Laceration without foreign body of right eyelid and periocular area, initial encounter: Secondary | ICD-10-CM

## 2016-08-03 DIAGNOSIS — Y999 Unspecified external cause status: Secondary | ICD-10-CM | POA: Diagnosis not present

## 2016-08-03 DIAGNOSIS — Z87891 Personal history of nicotine dependence: Secondary | ICD-10-CM | POA: Diagnosis not present

## 2016-08-03 DIAGNOSIS — W01198A Fall on same level from slipping, tripping and stumbling with subsequent striking against other object, initial encounter: Secondary | ICD-10-CM | POA: Insufficient documentation

## 2016-08-03 DIAGNOSIS — Y92480 Sidewalk as the place of occurrence of the external cause: Secondary | ICD-10-CM | POA: Diagnosis not present

## 2016-08-03 IMAGING — CT CT HEAD W/O CM
3 series · 16 of 46 positions shown, 19 images · non-contrast
Comparison: None.

CLINICAL DATA: Fall, hit head

EXAM:
CT HEAD WITHOUT CONTRAST
TECHNIQUE: Contiguous axial images were obtained from the base of the skull
through the vertex without intravenous contrast.

[Series 2: head wo · axial · 0.39mm/px · z∈[-121,-1]mm · 10 of 29 slices shown, 13 images]
[im 3/29  brain]
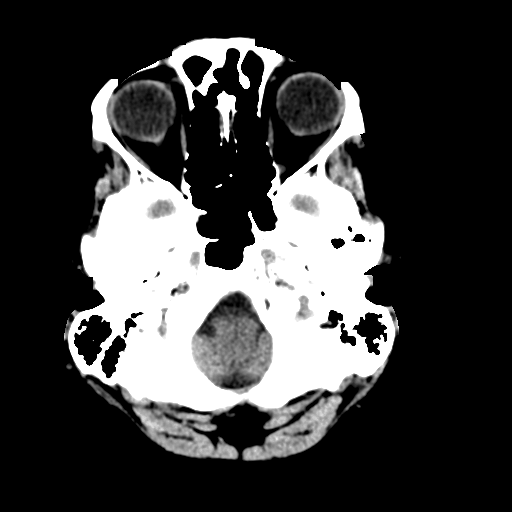
[im 3/29  bone]
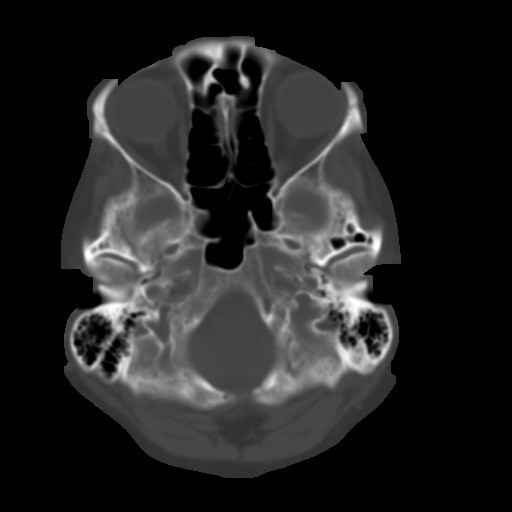
[im 6/29  brain]
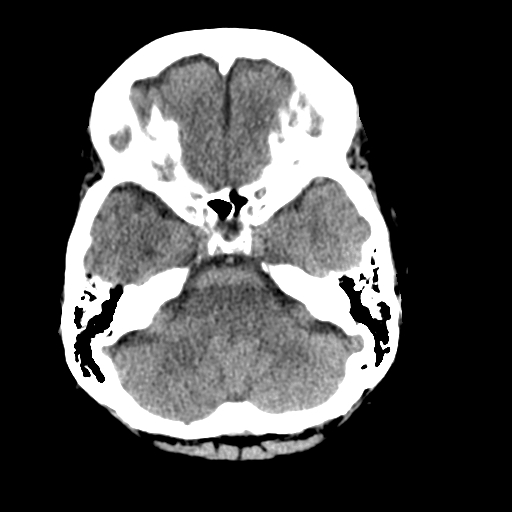
[im 8/29  brain]
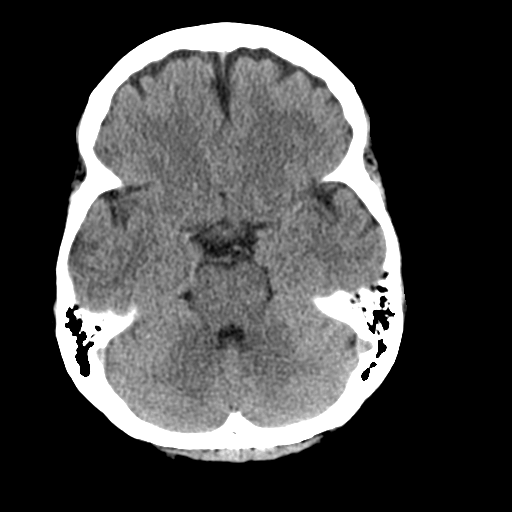
[im 11/29  brain]
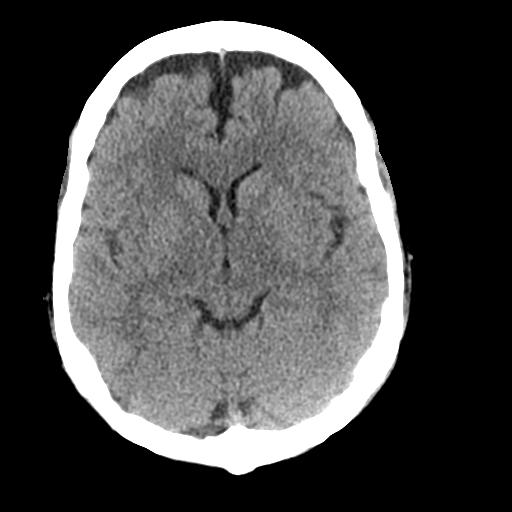
[im 14/29  brain]
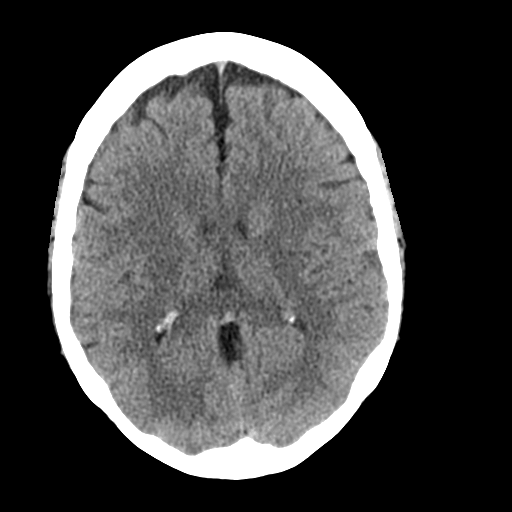
[im 14/29  bone]
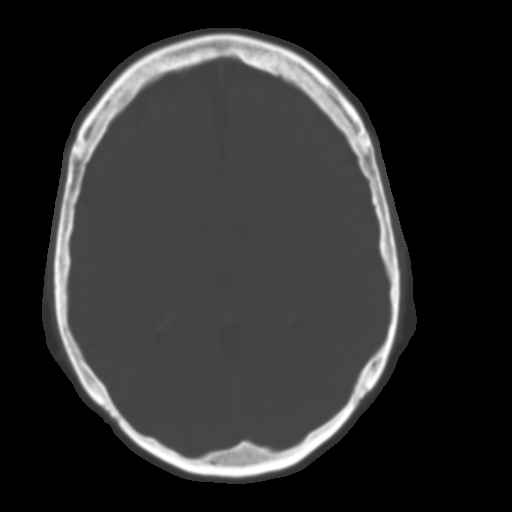
[im 16/29  brain]
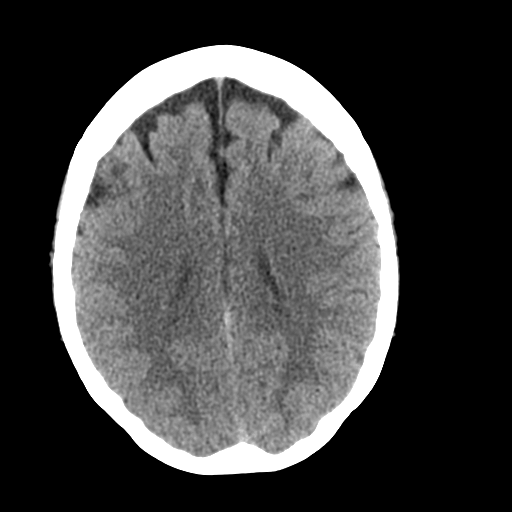
[im 19/29  brain]
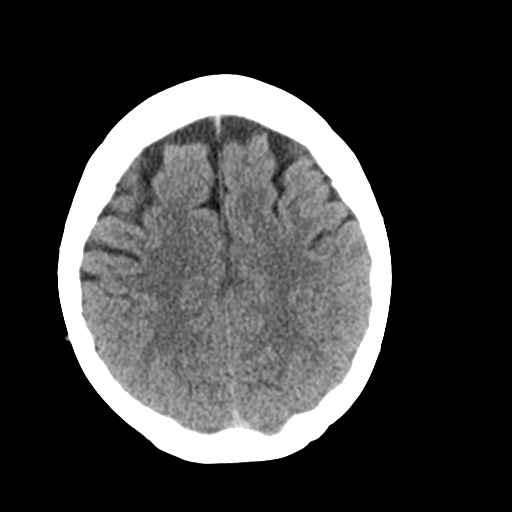
[im 22/29  brain]
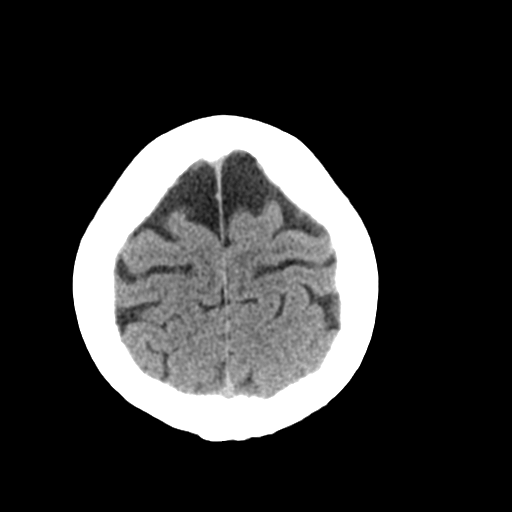
[im 24/29  brain]
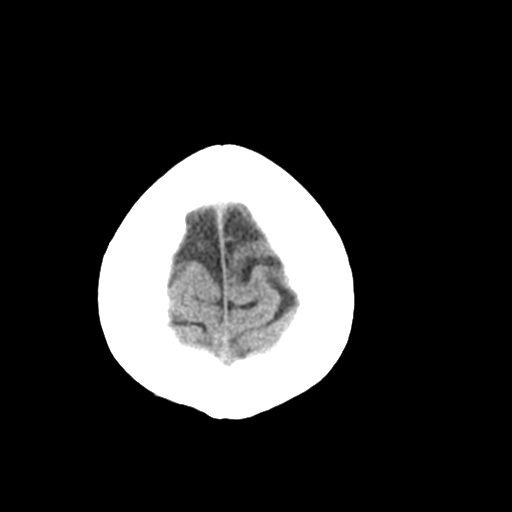
[im 24/29  bone]
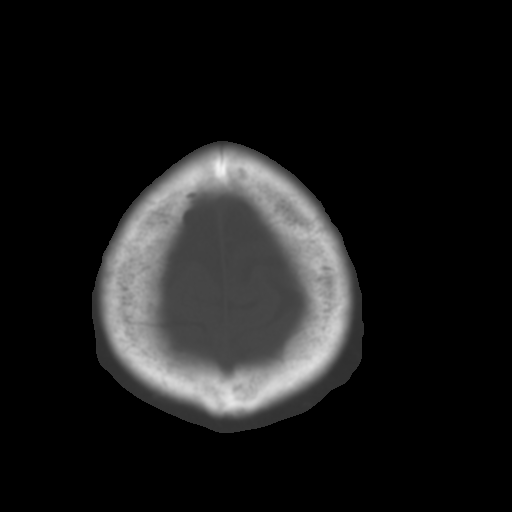
[im 27/29  brain]
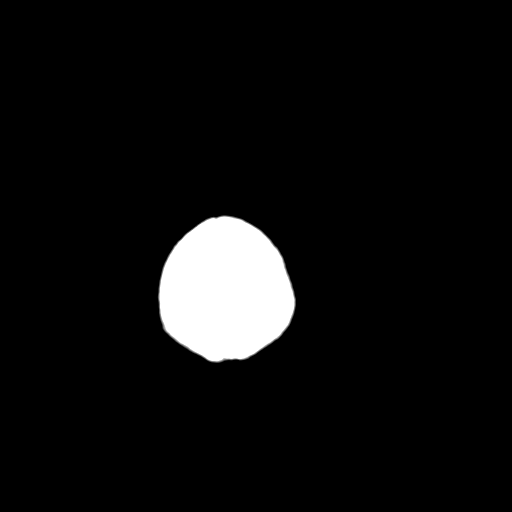

[Series 4: coronal soft tissue · coronal · 0.30mm/px · 3 of 60 slices shown]
[im 20/60  brain]
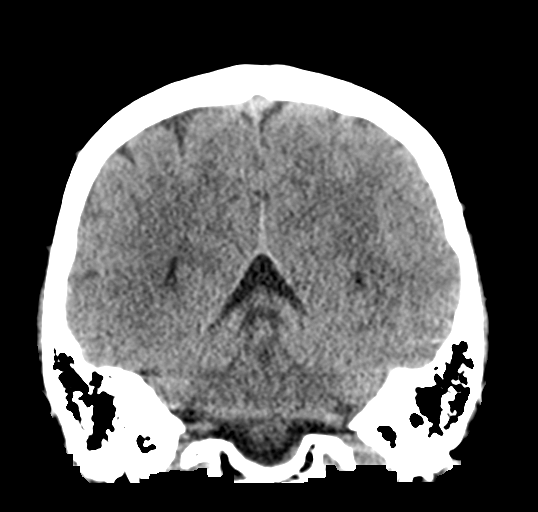
[im 27/60  brain]
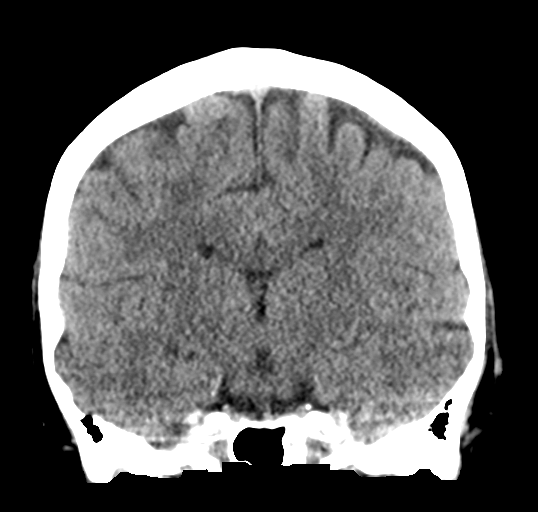
[im 33/60  brain]
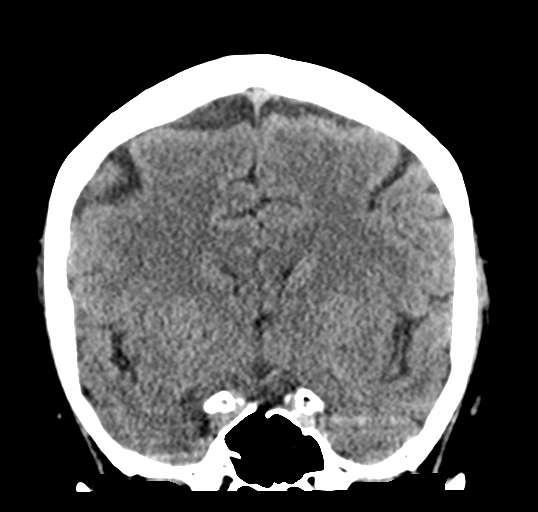

[Series 5: sagittal soft tissue · sagittal · 0.31mm/px · 3 of 50 slices shown]
[im 17/50  brain]
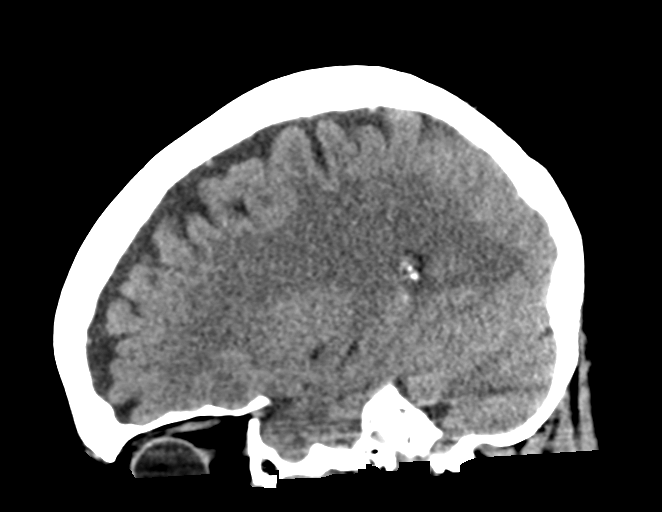
[im 25/50  brain]
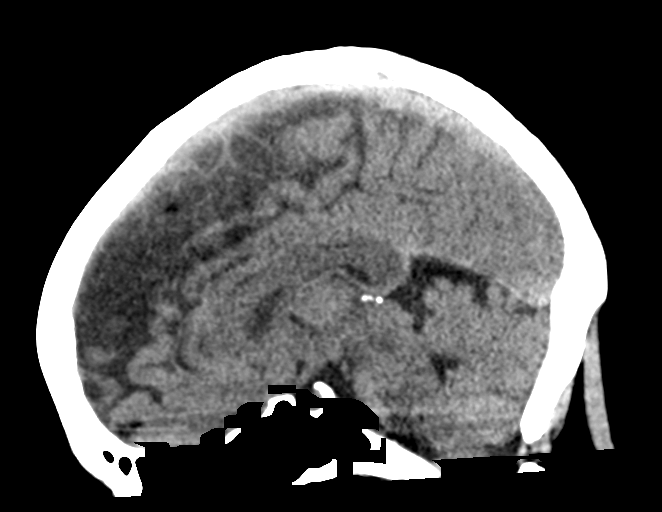
[im 33/50  brain]
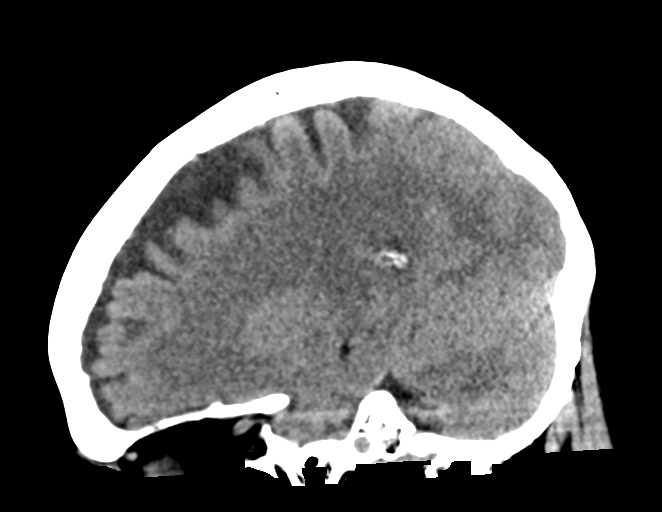

[16 of 46 positions shown; findings below may reference images not displayed]

FINDINGS: Brain: No acute intracranial abnormality. Specifically, no
hemorrhage, hydrocephalus, mass lesion, acute infarction, or
significant intracranial injury.

Vascular: No hyperdense vessel or unexpected calcification.

Skull: No acute calvarial abnormality.

Sinuses/Orbits: Visualized paranasal sinuses and mastoids clear.
Orbital soft tissues unremarkable.

Other: None
IMPRESSION: No acute intracranial abnormality.

## 2016-08-03 NOTE — ED Notes (Signed)
Pt is able to answer questions, but is slower to respond which patient states she is aware of. D/w Dr. Corky Downs, given verbal order for CT head without contrast.

## 2016-08-03 NOTE — ED Triage Notes (Signed)
Pt arrived via POV with family states she tripped when she was walking her dog and fell in the parking lot hitting the right eye brow. Bleeding controlled at this time. No signs of a hematoma present. Pt is on a daily baby aspirin.  Pt denies any LOC. States she laid there for about 5 minutes before getting up.  Pt states she now feels sluggish and tired since she fell.  Pt is ambulatory in triage, denies any pain and states she is worried she has a concussion.  No slurred speech. Pt answering all questions appropriately.

## 2016-08-03 NOTE — ED Notes (Signed)
Signature pad not working. Pt ambulated with steady gait to the lobby where her daughter was waiting.

## 2016-08-03 NOTE — ED Provider Notes (Signed)
[facility] Emergency Department Provider Note   ____________________________________________    I have reviewed the triage vital signs and the nursing notes.   HISTORY  Chief Complaint Head Injury     HPI Natalie [name] is a 66 y.o. female who presents after a fall. Patient reports she was walking a dog which became startled and pulled her so that she lost her balance and fell over and struck the right side of her head on the asphalt. She reports her daughter washed out her wound thoroughly but because she has a laceration she was sent to the emergency department for further evaluation. No neck pain. No chest pain. No neuro deficits. No abdominal pain nausea or vomiting. She is not on blood thinners. No LOC reported   Past Medical History:  Diagnosis Date  . Acute bronchospasm due to viral infection   . Anemia    history of  . Attention deficit disorder with hyperactivity   . Blood pressure elevated without history of HTN   . Depressive disorder   . Epistaxis   . Fatigue   . Hearing loss   . Hypothyroidism   . Memory change   . Migraine   . Numbness and tingling   . Obesity (BMI 30.0-34.9)   . Osteoarthritis   . Osteoporosis   . Polyuria   . Premature menopause   . Seizures ([place])    history of mini seizures, possible migraine induced    Patient Active Problem List   Diagnosis Date Noted  . HPV test positive 07/08/2015  . Elevated blood sugar 06/28/2015  . Difficulty hearing 06/28/2015  . Arthritis, degenerative 06/28/2015  . Obesity, Class I, BMI 30-34.9 06/28/2015  . Paresthesia 06/28/2015  . Purpura, nonthrombopenic ([place]) 06/28/2015  . Skin lesion of right leg 06/28/2015  . Other specified hypothyroidism 08/09/2014  . Migraine without aura and without status migrainosus, not intractable 09/19/2008  . Moderate major depression ([place]) 09/07/2006  . OP (osteoporosis) 09/07/2006    Past Surgical History:  Procedure Laterality  Date  . ABLATION  2006    Prior to Admission medications   Medication Sig Start Date End Date Taking? Authorizing Provider  aspirin EC 81 MG tablet Take 1 tablet (81 mg total) by mouth daily. 06/28/15  Yes Sowles, Drue Stager, MD  buPROPion [facility] SR) 150 MG 12 hr tablet Take 150 mg by mouth 2 (two) times daily.   Yes Lew Dawes, MD  citalopram (CELEXA) 40 MG tablet Take 40 mg by mouth daily.   Yes Lew Dawes, MD  levothyroxine (SYNTHROID, LEVOTHROID) 75 MCG tablet Take 1 tablet (75 mcg total) by mouth daily. 04/30/16  Yes Sowles, Drue Stager, MD  Vitamin D, Ergocalciferol, (DRISDOL) 50000 units CAPS capsule TAKE 1 CAPSULE (50,000 UNITS TOTAL) BY MOUTH EVERY 7 (SEVEN) DAYS. 06/11/16  Yes Sowles, Drue Stager, MD  meloxicam (MOBIC) 7.5 MG tablet TAKE 1 TABLET (7.5 MG TOTAL) BY MOUTH 2 (TWO) TIMES DAILY. Patient not taking: Reported on 08/03/2016 01/18/16   [name], MD  SUMAtriptan (IMITREX) 100 MG tablet Take 1 tablet (100 mg total) by mouth every 2 (two) hours as needed for migraine. May repeat in 2 hours if headache persists or recurs. 04/30/16   [name], MD  topiramate (TOPAMAX) 25 MG tablet Take 25 mg by mouth daily.     Lew Dawes, MD     Allergies Sulfur and Sulfamethoxazole-trimethoprim  Family History  Problem Relation Age of Onset  . Early death Father  suicide  . Depression Father   . Diabetes Father   . Cancer Father        prostate  . Hearing loss Father        due to war  . Alzheimer's disease Mother   . Heart disease Brother   . Heart disease Maternal Aunt   . Dementia Maternal Aunt   . Heart disease Maternal Uncle   . Dementia Maternal Grandmother   . Heart disease Brother     Social History Social History  Substance Use Topics  . Smoking status: Former Research scientist (life sciences)  . Smokeless tobacco: Never Used     Comment: patient has not used tobacco products in 17yrs  . Alcohol use 0.0 oz/week     Comment: very seldom; socially    Review of  Systems  Constitutional: No fever/chills Eyes: No visual changes.  ENT: No Neck pain Cardiovascular: Denies chest pain. Respiratory: Denies shortness of breath. Gastrointestinal: No abdominal pain.  No nausea, no vomiting.   Genitourinary: Negative for dysuria. Musculoskeletal: Negative for back pain. Skin: Laceration forehead Neurological: Negative for headaches or weakness   ____________________________________________   PHYSICAL EXAM:  VITAL SIGNS: ED Triage Vitals  Enc Vitals Group     BP 08/03/16 1032 130/68     Pulse Rate 08/03/16 1032 69     Resp 08/03/16 1032 20     Temp 08/03/16 1032 98.5 F (36.9 C)     Temp Source 08/03/16 1032 Oral     SpO2 08/03/16 1032 96 %     Weight 08/03/16 1033 102.1 kg (225 lb)     Height 08/03/16 1033 1.702 m (5\' 7" )     Head Circumference --      Peak Flow --      Pain Score 08/03/16 1033 0     Pain Loc --      Pain Edu? --      Excl. in [place]? --     Constitutional: Alert and oriented. No acute distress. Pleasant and interactive Eyes: Conjunctivae are normal. No orbital swelling Head: 1 cm linear laceration right forehead, no foreign body Nose: No congestion/rhinnorhea. Mouth/Throat: Mucous membranes are moist.  Teeth align normally Neck:  Painless ROM, no vertebral tenderness to palpation Cardiovascular: Normal rate, regular rhythm. Kermit Balo peripheral circulation. Respiratory: Normal respiratory effort.  No retractions.  Gastrointestinal: Soft and nontender. No distention.  No CVA tenderness. Genitourinary: deferred Musculoskeletal: Warm and well perfused Neurologic:  Normal speech and language. No gross focal neurologic deficits are appreciated.  Skin:  Skin is warm, dry and intact. Laceration is above Psychiatric: Mood and affect are normal. Speech and behavior are normal.  ____________________________________________   LABS (all labs ordered are listed, but only abnormal results are displayed)  Labs Reviewed - No data  to display ____________________________________________  EKG  None ____________________________________________  RADIOLOGY  CT head unremarkable ____________________________________________   PROCEDURES  Procedure(s) performed:yes  LACERATION REPAIR Performed by: Lavonia Drafts Authorized by: Lavonia Drafts Consent: Verbal consent obtained. Risks and benefits: risks, benefits and alternatives were discussed Consent given by: patient Patient identity confirmed: provided demographic data Prepped and Draped in normal sterile fashion Wound explored  Laceration Location: Right forehead  Laceration Length: 1 cm  No Foreign Bodies seen or palpated   Skin closure: Dermabond    Patient tolerance: Patient tolerated the procedure well with no immediate complications.     Critical Care performed: No ____________________________________________   INITIAL IMPRESSION / ASSESSMENT AND PLAN / ED COURSE  Pertinent  labs & imaging results that were available during my care of the patient were reviewed by me and considered in my medical decision making (see chart for details).  Patient well-appearing and in no acute distress. CT scan unremarkable. Laceration repaired with Dermabond, had been irrigated by daughter    ____________________________________________   FINAL CLINICAL IMPRESSION(S) / ED DIAGNOSES  Final diagnoses:  Injury of head, initial encounter  Laceration of eyelid of right eye without foreign body, initial encounter      NEW MEDICATIONS STARTED DURING THIS VISIT:  Discharge Medication List as of 08/03/2016  2:26 PM       Note:  This document was prepared using Dragon voice recognition software and may include unintentional dictation errors.    Lavonia Drafts, MD 08/03/16 [phone]

## 2016-08-03 NOTE — ED Notes (Signed)
Pt to ct 

## 2016-08-04 DIAGNOSIS — H40013 Open angle with borderline findings, low risk, bilateral: Secondary | ICD-10-CM | POA: Diagnosis not present

## 2016-08-04 DIAGNOSIS — H25813 Combined forms of age-related cataract, bilateral: Secondary | ICD-10-CM | POA: Diagnosis not present

## 2016-09-03 ENCOUNTER — Other Ambulatory Visit: Payer: Self-pay | Admitting: Family Medicine

## 2016-09-03 NOTE — Telephone Encounter (Signed)
Patient requesting refill of Vitamin D to [facility]. 

## 2016-09-19 ENCOUNTER — Other Ambulatory Visit: Payer: Self-pay | Admitting: Family Medicine

## 2016-09-19 DIAGNOSIS — G43001 Migraine without aura, not intractable, with status migrainosus: Secondary | ICD-10-CM

## 2016-09-19 NOTE — Telephone Encounter (Signed)
Patient requesting refill of Imitrex to [facility].  

## 2016-09-24 ENCOUNTER — Other Ambulatory Visit: Payer: Self-pay | Admitting: Family Medicine

## 2016-09-24 DIAGNOSIS — F330 Major depressive disorder, recurrent, mild: Secondary | ICD-10-CM | POA: Diagnosis not present

## 2016-10-09 ENCOUNTER — Encounter: Payer: Medicare Other | Admitting: Family Medicine

## 2016-11-03 ENCOUNTER — Ambulatory Visit (INDEPENDENT_AMBULATORY_CARE_PROVIDER_SITE_OTHER): Payer: Medicare Other | Admitting: Family Medicine

## 2016-11-03 ENCOUNTER — Encounter: Payer: Self-pay | Admitting: Family Medicine

## 2016-11-03 VITALS — BP 130/80 | HR 69 | Temp 98.0°F | Wt 223.8 lb

## 2016-11-03 DIAGNOSIS — E559 Vitamin D deficiency, unspecified: Secondary | ICD-10-CM

## 2016-11-03 DIAGNOSIS — G43001 Migraine without aura, not intractable, with status migrainosus: Secondary | ICD-10-CM | POA: Diagnosis not present

## 2016-11-03 DIAGNOSIS — F321 Major depressive disorder, single episode, moderate: Secondary | ICD-10-CM | POA: Diagnosis not present

## 2016-11-03 DIAGNOSIS — Z9181 History of falling: Secondary | ICD-10-CM | POA: Diagnosis not present

## 2016-11-03 DIAGNOSIS — D692 Other nonthrombocytopenic purpura: Secondary | ICD-10-CM

## 2016-11-03 DIAGNOSIS — E538 Deficiency of other specified B group vitamins: Secondary | ICD-10-CM | POA: Diagnosis not present

## 2016-11-03 DIAGNOSIS — Z23 Encounter for immunization: Secondary | ICD-10-CM

## 2016-11-03 DIAGNOSIS — E038 Other specified hypothyroidism: Secondary | ICD-10-CM | POA: Diagnosis not present

## 2016-11-03 DIAGNOSIS — M150 Primary generalized (osteo)arthritis: Secondary | ICD-10-CM | POA: Diagnosis not present

## 2016-11-03 DIAGNOSIS — M8949 Other hypertrophic osteoarthropathy, multiple sites: Secondary | ICD-10-CM

## 2016-11-03 DIAGNOSIS — E785 Hyperlipidemia, unspecified: Secondary | ICD-10-CM | POA: Diagnosis not present

## 2016-11-03 DIAGNOSIS — Z124 Encounter for screening for malignant neoplasm of cervix: Secondary | ICD-10-CM

## 2016-11-03 DIAGNOSIS — M159 Polyosteoarthritis, unspecified: Secondary | ICD-10-CM

## 2016-11-03 MED ORDER — LEVOTHYROXINE SODIUM 75 MCG PO TABS: 75.0000 ug | ORAL_TABLET | Freq: Every day | ORAL | 1 refills | Status: DC

## 2016-11-03 MED ORDER — SUMATRIPTAN SUCCINATE 100 MG PO TABS: 100.0000 mg | ORAL_TABLET | ORAL | 0 refills | Status: DC | PRN

## 2016-11-03 MED ORDER — VITAMIN D (ERGOCALCIFEROL) 1.25 MG (50000 UNIT) PO CAPS: 50000.0000 [IU] | ORAL_CAPSULE | ORAL | 0 refills | Status: DC

## 2016-11-03 MED ORDER — CYANOCOBALAMIN 1000 MCG/ML IJ SOLN
1000.0000 ug | Freq: Once | INTRAMUSCULAR | Status: AC
  Administered 2016-11-03: 1000 ug via INTRAMUSCULAR

## 2016-11-03 NOTE — Progress Notes (Signed)
Name: Natalie [name]   MRN: [id number]    DOB: 09/12/1950   Date:11/03/2016       Progress Note  Subjective  Chief Complaint  Chief Complaint  Patient presents with  . Hypothyroidism  . Hypertension  . Headache    HPI   Osteoporosis: on right femur, she is fair skin, family history of osteoporosis. She would like to hold off on all medications at this time, she wants to think about it. She has increased calcium in her diet.   B12 deficiency: she is willing to start monthly for injection, she feels tired.  Hypothyroidism: she has been taking medication as prescribed, she has noticed that her hair is thinning , she has chronic dry skin, no constipation, recheck labs next visit   Dyslipidemia: she does not want to start statin therapy, we will recheck labs next visit  OA: taking Meloxicam, she has been taking one at most daily and skips medication at times, she has daily pain but not much worse than usual. Worse symptoms is on the right outer hips. She also has pain on knees. She states has to stretch her left knee, feels like it pops, discussed patellar femoral syndrome and we will try home exercise for now  Moderate Depression: still sees Dr. Thurmond Butts, states mood has been stable, compliant with medication, she states never in remission, not seeing therapist, she denies suicidal thoughts or ideation.  Migraine headaches: she is on Topamax she states that episodes are on average 4 in a month, usually in a row.  Pain is described as right temporal or parietal area, piercing like and at times aching but other times feels like a band on top of her head. She states Imitrex resolves symptoms. She states sometimes radiates to neck and shoulder. Her episodes are seldom, but recently she has noticed increase in episodes, 3 after [place].    Obesity: she states she was doing well, but she likes food and has difficulty sticking with a diet. Discussed healthy life style  Patient Active  Problem List   Diagnosis Date Noted  . Morbid obesity, unspecified obesity type ([place]) 11/03/2016  . HPV test positive 07/08/2015  . Elevated blood sugar 06/28/2015  . Difficulty hearing 06/28/2015  . Arthritis, degenerative 06/28/2015  . Obesity, Class I, BMI 30-34.9 06/28/2015  . Paresthesia 06/28/2015  . Purpura, nonthrombopenic ([place]) 06/28/2015  . Skin lesion of right leg 06/28/2015  . Other specified hypothyroidism 08/09/2014  . Migraine without aura and without status migrainosus, not intractable 09/19/2008  . Moderate major depression ([place]) 09/07/2006  . OP (osteoporosis) 09/07/2006    Past Surgical History:  Procedure Laterality Date  . ABLATION  2006    Family History  Problem Relation Age of Onset  . Early death Father        suicide  . Depression Father   . Diabetes Father   . Cancer Father        prostate  . Hearing loss Father        due to war  . Alzheimer's disease Mother   . Heart disease Brother   . Heart disease Maternal Aunt   . Dementia Maternal Aunt   . Heart disease Maternal Uncle   . Dementia Maternal Grandmother   . Heart disease Brother     Social History   Social History  . Marital status: Single    Spouse name: N/A  . Number of children: 1  . Years of education: N/A   Occupational History  .  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 Smoking status: Former Research scientist (life sciences)  . Smokeless tobacco: Never Used     Comment: patient has not used tobacco products in 36yrs  . Alcohol use 0.0 oz/week     Comment: very seldom; socially  . Drug use: No  . Sexual activity: No   Other Topics Concern  . Not on file   Social History Narrative   Raised an adopted child on her own   Working part time reviewed documents     Current Outpatient Prescriptions:  .  aspirin EC 81 MG tablet, Take 1 tablet (81 mg total) by mouth daily., Disp: 30 tablet, Rfl: 0 .  buPROPion (WELLBUTRIN SR) 150 MG 12 hr tablet, Take  150 mg by mouth 2 (two) times daily., Disp: , Rfl:  .  citalopram (CELEXA) 40 MG tablet, Take 40 mg by mouth daily., Disp: , Rfl:  .  levothyroxine (SYNTHROID, LEVOTHROID) 75 MCG tablet, Take 1 tablet (75 mcg total) by mouth daily., Disp: 90 tablet, Rfl: 1 .  SUMAtriptan (IMITREX) 100 MG tablet, Take 1 tablet (100 mg total) by mouth every 2 (two) hours as needed for migraine. May repeat in 2 hours if headache persists or recurs., Disp: 10 tablet, Rfl: 0 .  topiramate (TOPAMAX) 25 MG tablet, Take 25 mg by mouth daily. , Disp: , Rfl:  .  Vitamin D, Ergocalciferol, (DRISDOL) 50000 units CAPS capsule, Take 1 capsule (50,000 Units total) by mouth every 7 (seven) days., Disp: 12 capsule, Rfl: 0  Current Facility-Administered Medications:  .  cyanocobalamin ((VITAMIN B-12)) injection 1,000 mcg, 1,000 mcg, Intramuscular, Once, [name], MD  Allergies  Allergen Reactions  . Sulfur Hives  . Sulfamethoxazole-Trimethoprim      ROS  Constitutional: Negative for fever or weight change.  Respiratory: Negative for cough and shortness of breath.   Cardiovascular: Negative for chest pain or palpitations.  Gastrointestinal: Negative for abdominal pain, no bowel changes.  Musculoskeletal: Negative for gait problem or joint swelling, has  A  has popping sensation frequently on left knee .  Skin: Negative for rash.  Neurological: Negative for dizziness , positive for intermittent headache.  No other specific complaints in a complete review of systems (except as listed in HPI above).  Objective  Vitals:   11/03/16 0828  BP: 130/80  Pulse: 69  Temp: 98 F (36.7 C)  TempSrc: Oral  SpO2: 98%  Weight: 223 lb 12.8 oz (101.5 kg)    Body mass index is 35.05 kg/m.  Physical Exam  Constitutional: Patient appears well-developed and well-nourished. Obese No distress.  HEENT: head atraumatic, normocephalic, pupils equal and reactive to light,  neck supple, throat within normal  limits Cardiovascular: Normal rate, regular rhythm and normal heart sounds.  No murmur heard. No BLE edema. Pulmonary/Chest: Effort normal and breath sounds normal. No respiratory distress. Abdominal: Soft.  There is no tenderness. Psychiatric: Patient has a normal mood and affect. behavior is normal. Judgment and thought content normal.  PHQ2/9: Depression screen [facility] 2/9 04/30/2016 11/02/2015 08/03/2015 06/28/2015 10/31/2014  Decreased Interest 0 0 0 0 0  Down, Depressed, Hopeless 1 0 0 0 0  PHQ - 2 Score 1 0 0 0 0     Fall Risk: Fall Risk  11/03/2016 11/03/2016 04/30/2016 11/02/2015 08/03/2015  Falls in the past year? - Yes No Yes Yes  Number falls in past yr: - 1 - 1 1  Injury  with Fall? Yes No - No Yes  Comment - - - - -  Risk for fall due to : History of fall(s) - - - -  Risk for fall due to: Comment - - - - -  Follow up Education provided - - - -     Assessment & Plan  1. Other specified hypothyroidism  - levothyroxine (SYNTHROID, LEVOTHROID) 75 MCG tablet; Take 1 tablet (75 mcg total) by mouth daily.  Dispense: 90 tablet; Refill: 1  2. Flu vaccine need  - Flu vaccine HIGH DOSE PF  3. Cervical cancer screening  She will return for CPE  4. Need for vaccination with 13-polyvalent pneumococcal conjugate vaccine  - Pneumococcal conjugate vaccine 13-valent IM  5. Morbid obesity, unspecified obesity type [facility])  Discussed with the patient the risk posed by an increased BMI. Discussed importance of portion control, calorie counting and at least 150 minutes of physical activity weekly. Avoid sweet beverages and drink more water. Eat at least 6 servings of fruit and vegetables daily   6. Purpura, nonthrombopenic ([place])  stable  7. B12 deficiency  - cyanocobalamin ((VITAMIN B-12)) injection 1,000 mcg; Inject 1 mL (1,000 mcg total) into the muscle once.  8. Moderate major depression ([facility])  Continue Dr. Thurmond Butts   9. Dyslipidemia  Reviewed life style modification   10.  Primary osteoarthritis involving multiple joints  Stable, off Meloxicam   11. Migraine without aura and with status migrainosus, not intractable  - SUMAtriptan (IMITREX) 100 MG tablet; Take 1 tablet (100 mg total) by mouth every 2 (two) hours as needed for migraine. May repeat in 2 hours if headache persists or recurs.  Dispense: 10 tablet; Refill: 0  12. History of fall  Went to [facility] negative CT head  13. Vitamin D deficiency  - Vitamin D, Ergocalciferol, (DRISDOL) 50000 units CAPS capsule; Take 1 capsule (50,000 Units total) by mouth every 7 (seven) days.  Dispense: 12 capsule; Refill: 0

## 2016-11-03 NOTE — Patient Instructions (Signed)
[facility] Ask your health care provider which exercises are safe for you. Do exercises exactly as told by your health care provider and adjust them as directed. It is normal to feel mild stretching, pulling, tightness, or discomfort as you do these exercises, but you should stop right away if you feel sudden pain or your pain gets worse.Do not begin these exercises until told by your health care provider. Stretching and range of motion exercises These exercises warm up your muscles and joints and improve the movement and flexibility of your knee. These exercises also help to relieve pain, swelling, numbness, and tingling. Exercise A: Quadriceps, prone  1. Lie on your abdomen on a firm surface, such as a bed or padded floor. 2. Bend your left / right knee and hold your ankle. If you cannot reach your ankle or pant leg, loop a belt around your foot and grab the belt instead. 3. Gently pull your heel toward your buttocks. Your knee should not slide out to the side. You should feel a stretch in the front of your thigh and knee. 4. Hold this position for __________ seconds. Repeat __________ times. Complete this stretch __________ times a day. Exercise B: Superior patellar mobilization 1. Sit with your legs out in front of you. 2. Place your fingertips on the bottom border of your left / right kneecap. Keep your thigh muscles relaxed. 3. Gently pull toward you so your kneecap slides up toward your hip. 4. Hold this position for __________ seconds. Repeat __________ times. Complete this stretch __________ times a day. Exercise C: Standing lunge ( hip flexors) 1. Stand with your left / right foot 2-3 ft (0.6-1 m) in front of your other foot. 2. Keeping good posture with your head over your shoulders, tuck your tailbone underneath you and shift your hips forward. You should feel a gentle stretch in the front of your back thigh and hip. 3. Hold this position for __________ seconds. Repeat  __________ times. Complete this stretch __________ times a day. Strengthening exercises These exercises build strength and endurance in your knee. Endurance is the ability to use your muscles for a long time, even after they get tired. Exercise D: Wall slides ( quadriceps) 1. Lean your back against a smooth wall or door while you walk your feet out 18-24 inches (46-61 cm) from it. 2. Place your feet hip-width apart. 3. Slowly slide down the wall or door until your knees bend __________ degrees. Keep your knees over your heels, not your toes. Keep your knees in line with your hips. 4. Hold for __________ seconds. 5. Push through your heels to stand up to rest for __________ seconds after each repetition. Repeat __________ times. Complete this exercise __________ times a day. Exercise E: Straight leg raises ( hip abductors) 1. Lie on your side with your left / right leg in the top position. Lie so your head, shoulder, knee, and hip line up. You may bend your bottom knee to help you keep your balance. 2. Roll your hips slightly forward so your hips are stacked directly over each other and your left / right knee is facing forward. 3. Leading with your heel, lift your top leg 4-6 inches (10-15 cm). You should feel the muscles in your outer hip lifting. ? Do not let your foot drift forward. ? Do not let your knee roll toward the ceiling. 4. Hold this position for __________ seconds. 5. Slowly lower your leg to the starting position. 6. Allow your  muscles to relax completely. Repeat __________ times. Complete this exercise __________ times a day. This information is not intended to replace advice given to you by your health care provider. Make sure you discuss any questions you have with your health care provider. Document Released: 01/20/2005 Document Revised: 09/27/2015 Document Reviewed: 12/02/2014 Elsevier Interactive Patient Education  2018 [employer].

## 2016-11-10 ENCOUNTER — Other Ambulatory Visit: Payer: Self-pay | Admitting: Family Medicine

## 2016-11-10 DIAGNOSIS — E038 Other specified hypothyroidism: Secondary | ICD-10-CM

## 2016-11-21 ENCOUNTER — Telehealth: Payer: Self-pay

## 2016-11-21 NOTE — Telephone Encounter (Deleted)
Contacted patient to notify her labs were normal.Left a message to call us back.

## 2016-11-28 ENCOUNTER — Encounter: Payer: Medicare Other | Admitting: Family Medicine

## 2016-12-22 DIAGNOSIS — F330 Major depressive disorder, recurrent, mild: Secondary | ICD-10-CM | POA: Diagnosis not present

## 2017-02-10 DIAGNOSIS — H2512 Age-related nuclear cataract, left eye: Secondary | ICD-10-CM | POA: Diagnosis not present

## 2017-02-10 DIAGNOSIS — H2513 Age-related nuclear cataract, bilateral: Secondary | ICD-10-CM | POA: Diagnosis not present

## 2017-02-10 DIAGNOSIS — H25043 Posterior subcapsular polar age-related cataract, bilateral: Secondary | ICD-10-CM | POA: Diagnosis not present

## 2017-02-10 DIAGNOSIS — H25013 Cortical age-related cataract, bilateral: Secondary | ICD-10-CM | POA: Diagnosis not present

## 2017-02-10 DIAGNOSIS — H18413 Arcus senilis, bilateral: Secondary | ICD-10-CM | POA: Diagnosis not present

## 2017-04-17 DIAGNOSIS — H25813 Combined forms of age-related cataract, bilateral: Secondary | ICD-10-CM | POA: Diagnosis not present

## 2017-04-17 DIAGNOSIS — H2512 Age-related nuclear cataract, left eye: Secondary | ICD-10-CM | POA: Diagnosis not present

## 2017-04-17 DIAGNOSIS — H2511 Age-related nuclear cataract, right eye: Secondary | ICD-10-CM | POA: Diagnosis not present

## 2017-04-26 ENCOUNTER — Other Ambulatory Visit: Payer: Self-pay | Admitting: Family Medicine

## 2017-04-26 DIAGNOSIS — E559 Vitamin D deficiency, unspecified: Secondary | ICD-10-CM

## 2017-04-26 DIAGNOSIS — E038 Other specified hypothyroidism: Secondary | ICD-10-CM

## 2017-05-01 DIAGNOSIS — H2511 Age-related nuclear cataract, right eye: Secondary | ICD-10-CM | POA: Diagnosis not present

## 2017-05-01 DIAGNOSIS — H25811 Combined forms of age-related cataract, right eye: Secondary | ICD-10-CM | POA: Diagnosis not present

## 2017-05-04 ENCOUNTER — Ambulatory Visit: Payer: Medicare Other | Admitting: Family Medicine

## 2017-05-06 ENCOUNTER — Ambulatory Visit: Payer: Medicare Other | Admitting: Family Medicine

## 2017-05-12 ENCOUNTER — Ambulatory Visit (INDEPENDENT_AMBULATORY_CARE_PROVIDER_SITE_OTHER): Payer: Medicare Other | Admitting: Family Medicine

## 2017-05-12 ENCOUNTER — Encounter: Payer: Self-pay | Admitting: Family Medicine

## 2017-05-12 DIAGNOSIS — M8949 Other hypertrophic osteoarthropathy, multiple sites: Secondary | ICD-10-CM

## 2017-05-12 DIAGNOSIS — F321 Major depressive disorder, single episode, moderate: Secondary | ICD-10-CM | POA: Diagnosis not present

## 2017-05-12 DIAGNOSIS — M810 Age-related osteoporosis without current pathological fracture: Secondary | ICD-10-CM | POA: Diagnosis not present

## 2017-05-12 DIAGNOSIS — M150 Primary generalized (osteo)arthritis: Secondary | ICD-10-CM

## 2017-05-12 DIAGNOSIS — E559 Vitamin D deficiency, unspecified: Secondary | ICD-10-CM | POA: Diagnosis not present

## 2017-05-12 DIAGNOSIS — E785 Hyperlipidemia, unspecified: Secondary | ICD-10-CM

## 2017-05-12 DIAGNOSIS — E538 Deficiency of other specified B group vitamins: Secondary | ICD-10-CM

## 2017-05-12 DIAGNOSIS — R739 Hyperglycemia, unspecified: Secondary | ICD-10-CM

## 2017-05-12 DIAGNOSIS — R87810 Cervical high risk human papillomavirus (HPV) DNA test positive: Secondary | ICD-10-CM

## 2017-05-12 DIAGNOSIS — E039 Hypothyroidism, unspecified: Secondary | ICD-10-CM | POA: Diagnosis not present

## 2017-05-12 DIAGNOSIS — D692 Other nonthrombocytopenic purpura: Secondary | ICD-10-CM

## 2017-05-12 DIAGNOSIS — Z9181 History of falling: Secondary | ICD-10-CM

## 2017-05-12 DIAGNOSIS — R030 Elevated blood-pressure reading, without diagnosis of hypertension: Secondary | ICD-10-CM | POA: Diagnosis not present

## 2017-05-12 DIAGNOSIS — M159 Polyosteoarthritis, unspecified: Secondary | ICD-10-CM

## 2017-05-12 DIAGNOSIS — G43001 Migraine without aura, not intractable, with status migrainosus: Secondary | ICD-10-CM | POA: Diagnosis not present

## 2017-05-12 MED ORDER — CYANOCOBALAMIN 1000 MCG/ML IJ SOLN
1000.0000 ug | Freq: Once | INTRAMUSCULAR | Status: AC
  Administered 2017-05-12: 1000 ug via INTRAMUSCULAR

## 2017-05-12 NOTE — Addendum Note (Signed)
Addended by: Chilton Greathouse on: 05/12/2017 03:43 PM   Modules accepted: Orders

## 2017-05-12 NOTE — Progress Notes (Addendum)
Name: Natalie [name]   MRN: [id number]    DOB: 06-13-1950   Date:05/12/2017       Progress Note  Subjective  Chief Complaint  Chief Complaint  Patient presents with  . Hypothyroidism  . Nail Problem  . Depression    HPI   Osteoporosis: on right femur, she is fair skin, family history of osteoporosis. She would like to hold off on all medications at this time, she wants to think about it. She has increased calcium in her diet. Asked her again today. Recheck vitamin D today, taking rx once a week.   B12 deficiency: she has not been coming for injections and is due for repeat labs, can try getting SL  Hypothyroidism: she has been taking medication as prescribed, she has noticed that her hair is thinning , she has chronic dry skin, no constipation. We will recheck labs today   Dyslipidemia: she does not want to start statin therapy, last LDL was 135, we will recheck today  DBP elevation; we will recheck before she goes.   OA: taking  Tylenol prn , off meloxicam and is doing well at this time  Moderate Depression: still sees Dr. Thurmond Butts, states mood has been stable, compliant with medication, she states never in remission, not seeing therapist, she denies suicidal thoughts or ideation. She has lack of motivation   Migraine headaches: she is on Topamax she states that episodes are on average 1 in a month, doing better. Pain is described as right temporal or parietal area, piercing like and at times aching but other times feels like a band on top of her head. She states Imitrex resolves symptoms - she states insurance is not paying for imitrex but she will let me know what is covered from now on . She states sometimes radiates to neck and shoulder.    Obesity: weight has gone up since last visit, she states she is a emotional eater. Discussed life style modification   HPV positive: not sexually active in 20 years, explained importance of rechecking to prevent cervical cancer  Nail  problems: thick toenails, hammer toes, likely from trauma, also ridges on finger nail, advised biotin and reassurance  Patient Active Problem List   Diagnosis Date Noted  . Morbid obesity, unspecified obesity type ([place]) 11/03/2016  . HPV test positive 07/08/2015  . Elevated blood sugar 06/28/2015  . Difficulty hearing 06/28/2015  . Arthritis, degenerative 06/28/2015  . Obesity, Class I, BMI 30-34.9 06/28/2015  . Paresthesia 06/28/2015  . Purpura, nonthrombopenic ([place]) 06/28/2015  . Skin lesion of right leg 06/28/2015  . Other specified hypothyroidism 08/09/2014  . Migraine without aura and without status migrainosus, not intractable 09/19/2008  . Moderate major depression ([place]) 09/07/2006  . OP (osteoporosis) 09/07/2006    Past Surgical History:  Procedure Laterality Date  . ABLATION  2006    Family History  Problem Relation Age of Onset  . Early death Father        suicide  . Depression Father   . Diabetes Father   . Cancer Father        prostate  . Hearing loss Father        due to war  . Alzheimer's disease Mother   . Heart disease Brother   . Heart disease Maternal Aunt   . Dementia Maternal Aunt   . Heart disease Maternal Uncle   . Dementia Maternal Grandmother   . Heart disease Brother     Social History   Socioeconomic History  .  Marital status: Single    Spouse name: Not on file  . Number of children: 1  . Years of education: Not on file  . Highest education level: Bachelor's degree (e.g., BA, AB, BS)  Occupational History  . Occupation: retired   Scientific laboratory technician  . Financial resource strain: Not on file  . Food insecurity:    Worry: Not on file    Inability: Not on file  . Transportation needs:    Medical: Not on file    Non-medical: Not on file  Tobacco Use  . Smoking status: Former Smoker    Packs/day: 1.00    Years: 6.00    Pack years: 6.00    Types: Cigarettes    Last attempt to quit: 02/11/1974    Years since quitting: 43.2  . Smokeless  tobacco: Never Used  Substance and Sexual Activity  . Alcohol use: Yes    Alcohol/week: 0.0 oz    Comment: very seldom; socially  . Drug use: No  . Sexual activity: Not Currently  Lifestyle  . Physical activity:    Days per week: Not on file    Minutes per session: Not on file  . Stress: Not on file  Relationships  . Social connections:    Talks on phone: Not on file    Gets together: Not on file    Attends religious service: Not on file    Active member of club or organization: Not on file    Attends meetings of clubs or organizations: Not on file    Relationship status: Not on file  . Intimate partner violence:    Fear of current or ex partner: Not on file    Emotionally abused: Not on file    Physically abused: Not on file    Forced sexual activity: Not on file  Other Topics Concern  . Not on file  Social History Narrative   Raised an adopted child on her own   Working part time reviewed documents     Current Outpatient Medications:  .  aspirin EC 81 MG tablet, Take 1 tablet (81 mg total) by mouth daily., Disp: 30 tablet, Rfl: 0 .  buPROPion (WELLBUTRIN SR) 150 MG 12 hr tablet, Take 150 mg by mouth 2 (two) times daily., Disp: , Rfl:  .  citalopram (CELEXA) 40 MG tablet, Take 40 mg by mouth daily., Disp: , Rfl:  .  levothyroxine (SYNTHROID, LEVOTHROID) 75 MCG tablet, TAKE 1 TABLET BY MOUTH EVERY DAY, Disp: 30 tablet, Rfl: 0 .  SUMAtriptan (IMITREX) 100 MG tablet, Take 1 tablet (100 mg total) by mouth every 2 (two) hours as needed for migraine. May repeat in 2 hours if headache persists or recurs., Disp: 10 tablet, Rfl: 0 .  topiramate (TOPAMAX) 25 MG tablet, Take 25 mg by mouth daily. , Disp: , Rfl:  .  Vitamin D, Ergocalciferol, (DRISDOL) 50000 units CAPS capsule, TAKE 1 CAPSULE (50,000 UNITS TOTAL) BY MOUTH EVERY 7 (SEVEN) DAYS., Disp: 12 capsule, Rfl: 0  Allergies  Allergen Reactions  . Sulfamethoxazole-Trimethoprim      ROS  Constitutional: Negative for fever ,  positive for  weight change.  Respiratory: Negative for cough and shortness of breath.   Cardiovascular: Negative for chest pain or palpitations.  Gastrointestinal: Negative for abdominal pain, no bowel changes.  Musculoskeletal: Negative for gait problem or joint swelling.  Skin: Negative for rash.  Neurological: Negative for dizziness or headache.  No other specific complaints in a complete review of systems (except  as listed in HPI above).  Objective  Vitals:   05/12/17 1441  BP: 120/90  Pulse: (!) 117  Resp: 16  SpO2: 96%  Weight: 235 lb 9.6 oz (106.9 kg)  Height: 5\' 6"  (1.676 m)    Body mass index is 38.03 kg/m.  Physical Exam  Constitutional: Patient appears well-developed and well-nourished. Obese No distress.  HEENT: head atraumatic, normocephalic, pupils equal and reactive to light, neck supple, throat within normal limits Cardiovascular: Normal rate, regular rhythm and normal heart sounds.  No murmur heard. No BLE edema. Pulmonary/Chest: Effort normal and breath sounds normal. No respiratory distress. Abdominal: Soft.  There is no tenderness. Psychiatric: Patient has a normal mood and affect. behavior is normal. Judgment and thought content normal. Skin: purpura on arms   PHQ2/9: Depression screen [facility] 2/9 04/30/2016 11/02/2015 08/03/2015 06/28/2015 10/31/2014  Decreased Interest 0 0 0 0 0  Down, Depressed, Hopeless 1 0 0 0 0  PHQ - 2 Score 1 0 0 0 0     Fall Risk: Fall Risk  05/12/2017 05/12/2017 11/03/2016 11/03/2016 04/30/2016  Falls in the past year? Yes No - Yes No  Number falls in past yr: - - - 1 -  Injury with Fall? Yes - Yes No -  Comment - - - - -  Risk for fall due to : History of fall(s) - History of fall(s) - -  Risk for fall due to: Comment - - - - -  Follow up - - Education provided - -    Functional Status Survey: Is the patient deaf or have difficulty hearing?: No Does the patient have difficulty seeing, even when wearing glasses/contacts?:  No Does the patient have difficulty concentrating, remembering, or making decisions?: No Does the patient have difficulty walking or climbing stairs?: No Does the patient have difficulty dressing or bathing?: No Does the patient have difficulty doing errands alone such as visiting a doctor's office or shopping?: No    Assessment & Plan   1. Morbid obesity, unspecified obesity type [facility])  Discussed with the patient the risk posed by an increased BMI. Discussed importance of portion control, calorie counting and at least 150 minutes of physical activity weekly. Avoid sweet beverages and drink more water. Eat at least 6 servings of fruit and vegetables daily   2. Moderate major depression ([facility])  Seeing Dr. Thurmond Butts , still has lack of motivation but stable   3. Purpura, nonthrombopenic ([place])  stable  4. B12 deficiency  - Vitamin B12  5. Dyslipidemia  - Lipid panel  6. Primary osteoarthritis involving multiple joints  Doing well on Tylenol   7. Vitamin D deficiency  - VITAMIN D 25 Hydroxy (Vit-D Deficiency, Fractures)  8. History of fall  Fell Summer 2018, cut her scalped, pulled by her dog  9. Migraine without aura and with status migrainosus, not intractable  Doing well with prn medication and low dose topamax   10. Age-related osteoporosis without current pathological fracture  - COMPLETE METABOLIC PANEL WITH GFR  11. Hyperglycemia  - Hemoglobin A1c  12. Elevated blood pressure reading  - CBC with Differential/Platelet  13. Adult hypothyroidism  - TSH We will check labs before we refill medication

## 2017-05-13 LAB — COMPLETE METABOLIC PANEL WITHOUT GFR
AG Ratio: 1.7 (calc) (ref 1.0–2.5)
ALT: 14 U/L (ref 6–29)
AST: 17 U/L (ref 10–35)
Albumin: 4 g/dL (ref 3.6–5.1)
Alkaline phosphatase (APISO): 87 U/L (ref 33–130)
BUN: 24 mg/dL (ref 7–25)
CO2: 28 mmol/L (ref 20–32)
Calcium: 8.8 mg/dL (ref 8.6–10.4)
Chloride: 105 mmol/L (ref 98–110)
Creat: 0.74 mg/dL (ref 0.50–0.99)
GFR, Est African American: 97 mL/min/1.73m2
GFR, Est Non African American: 84 mL/min/1.73m2
Globulin: 2.3 g/dL (ref 1.9–3.7)
Glucose, Bld: 83 mg/dL (ref 65–139)
Potassium: 3.7 mmol/L (ref 3.5–5.3)
Sodium: 140 mmol/L (ref 135–146)
Total Bilirubin: 0.5 mg/dL (ref 0.2–1.2)
Total Protein: 6.3 g/dL (ref 6.1–8.1)

## 2017-05-13 LAB — CBC WITH DIFFERENTIAL/PLATELET
Basophils Absolute: 20 cells/uL (ref 0–200)
Basophils Relative: 0.3 %
Eosinophils Absolute: 107 cells/uL (ref 15–500)
Eosinophils Relative: 1.6 %
HCT: 40.7 % (ref 35.0–45.0)
Hemoglobin: 13.8 g/dL (ref 11.7–15.5)
Lymphs Abs: 1796 cells/uL (ref 850–3900)
MCH: 30.9 pg (ref 27.0–33.0)
MCHC: 33.9 g/dL (ref 32.0–36.0)
MCV: 91.3 fL (ref 80.0–100.0)
MPV: 9.8 fL (ref 7.5–12.5)
Monocytes Relative: 5.5 %
Neutro Abs: 4409 cells/uL (ref 1500–7800)
Neutrophils Relative %: 65.8 %
Platelets: 237 10*3/uL (ref 140–400)
RBC: 4.46 10*6/uL (ref 3.80–5.10)
RDW: 12 % (ref 11.0–15.0)
Total Lymphocyte: 26.8 %
WBC mixed population: 369 cells/uL (ref 200–950)
WBC: 6.7 10*3/uL (ref 3.8–10.8)

## 2017-05-13 LAB — LIPID PANEL
Cholesterol: 178 mg/dL
HDL: 40 mg/dL — ABNORMAL LOW
LDL Cholesterol (Calc): 121 mg/dL — ABNORMAL HIGH
Non-HDL Cholesterol (Calc): 138 mg/dL — ABNORMAL HIGH
Total CHOL/HDL Ratio: 4.5 (calc)
Triglycerides: 74 mg/dL

## 2017-05-13 LAB — VITAMIN D 25 HYDROXY (VIT D DEFICIENCY, FRACTURES): Vit D, 25-Hydroxy: 32 ng/mL (ref 30–100)

## 2017-05-13 LAB — HEMOGLOBIN A1C
Hgb A1c MFr Bld: 5.2 % of total Hgb (ref <5.7)
Mean Plasma Glucose: 103 (calc)
eAG (mmol/L): 5.7 (calc)

## 2017-05-13 LAB — TSH: TSH: 2.09 m[IU]/L (ref 0.40–4.50)

## 2017-05-13 LAB — VITAMIN B12: Vitamin B-12: >2000 pg/mL — ABNORMAL HIGH (ref 200–1100)

## 2017-05-15 DIAGNOSIS — F330 Major depressive disorder, recurrent, mild: Secondary | ICD-10-CM | POA: Diagnosis not present

## 2017-06-14 ENCOUNTER — Other Ambulatory Visit: Payer: Self-pay | Admitting: Family Medicine

## 2017-06-14 DIAGNOSIS — E559 Vitamin D deficiency, unspecified: Secondary | ICD-10-CM

## 2017-07-21 ENCOUNTER — Other Ambulatory Visit: Payer: Self-pay | Admitting: Family Medicine

## 2017-07-21 DIAGNOSIS — E038 Other specified hypothyroidism: Secondary | ICD-10-CM

## 2017-07-21 DIAGNOSIS — E559 Vitamin D deficiency, unspecified: Secondary | ICD-10-CM

## 2017-07-21 NOTE — Telephone Encounter (Signed)
Refill request for thyroid medication. Levothryoxine.   Last Physical: 05/12/2017   Lab Results  Component Value Date   TSH 2.09 05/12/2017     Follow up on 08/19/2017

## 2017-08-19 ENCOUNTER — Encounter: Payer: Self-pay | Admitting: Family Medicine

## 2017-08-19 ENCOUNTER — Other Ambulatory Visit (HOSPITAL_COMMUNITY)
Admission: RE | Admit: 2017-08-19 | Discharge: 2017-08-19 | Disposition: A | Discharge status: Home / Self Care | Payer: Medicare Other | Source: Ambulatory Visit | Attending: Family Medicine | Admitting: Family Medicine

## 2017-08-19 ENCOUNTER — Ambulatory Visit (INDEPENDENT_AMBULATORY_CARE_PROVIDER_SITE_OTHER): Payer: Medicare Other | Admitting: Family Medicine

## 2017-08-19 VITALS — BP 124/72 | HR 96 | Temp 98.9°F | Resp 16 | Ht 66.0 in | Wt 225.1 lb

## 2017-08-19 DIAGNOSIS — Z0001 Encounter for general adult medical examination with abnormal findings: Secondary | ICD-10-CM

## 2017-08-19 DIAGNOSIS — R9431 Abnormal electrocardiogram [ECG] [EKG]: Secondary | ICD-10-CM

## 2017-08-19 DIAGNOSIS — R87810 Cervical high risk human papillomavirus (HPV) DNA test positive: Secondary | ICD-10-CM

## 2017-08-19 DIAGNOSIS — E785 Hyperlipidemia, unspecified: Secondary | ICD-10-CM

## 2017-08-19 DIAGNOSIS — Z0000 Encounter for general adult medical examination without abnormal findings: Secondary | ICD-10-CM

## 2017-08-19 DIAGNOSIS — Z1231 Encounter for screening mammogram for malignant neoplasm of breast: Secondary | ICD-10-CM

## 2017-08-19 DIAGNOSIS — E538 Deficiency of other specified B group vitamins: Secondary | ICD-10-CM

## 2017-08-19 DIAGNOSIS — Z1211 Encounter for screening for malignant neoplasm of colon: Secondary | ICD-10-CM

## 2017-08-19 MED ORDER — CYANOCOBALAMIN 1000 MCG/ML IJ SOLN
1000.0000 ug | Freq: Once | INTRAMUSCULAR | Status: AC
  Administered 2017-08-19: 1000 ug via INTRAMUSCULAR

## 2017-08-19 NOTE — Patient Instructions (Signed)
Preventive Care 65 Years and Older, Female Preventive care refers to lifestyle choices and visits with your health care provider that can promote health and wellness. What does preventive care include?  A yearly physical exam. This is also called an annual well check.  Dental exams once or twice a year.  Routine eye exams. Ask your health care provider how often you should have your eyes checked.  Personal lifestyle choices, including: ? Daily care of your teeth and gums. ? Regular physical activity. ? Eating a healthy diet. ? Avoiding tobacco and drug use. ? Limiting alcohol use. ? Practicing safe sex. ? Taking low-dose aspirin every day. ? Taking vitamin and mineral supplements as recommended by your health care provider. What happens during an annual well check? The services and screenings done by your health care provider during your annual well check will depend on your age, overall health, lifestyle risk factors, and family history of disease. Counseling Your health care provider may ask you questions about your:  Alcohol use.  Tobacco use.  Drug use.  Emotional well-being.  Home and relationship well-being.  Sexual activity.  Eating habits.  History of falls.  Memory and ability to understand (cognition).  Work and work environment.  Reproductive health.  Screening You may have the following tests or measurements:  Height, weight, and BMI.  Blood pressure.  Lipid and cholesterol levels. These may be checked every 5 years, or more frequently if you are over 50 years old.  Skin check.  Lung cancer screening. You may have this screening every year starting at age 55 if you have a 30-pack-year history of smoking and currently smoke or have quit within the past 15 years.  Fecal occult blood test (FOBT) of the stool. You may have this test every year starting at age 50.  Flexible sigmoidoscopy or colonoscopy. You may have a sigmoidoscopy every 5 years or  a colonoscopy every 10 years starting at age 50.  Hepatitis C blood test.  Hepatitis B blood test.  Sexually transmitted disease (STD) testing.  Diabetes screening. This is done by checking your blood sugar (glucose) after you have not eaten for a while (fasting). You may have this done every 1-3 years.  Bone density scan. This is done to screen for osteoporosis. You may have this done starting at age 65.  Mammogram. This may be done every 1-2 years. Talk to your health care provider about how often you should have regular mammograms.  Talk with your health care provider about your test results, treatment options, and if necessary, the need for more tests. Vaccines Your health care provider may recommend certain vaccines, such as:  Influenza vaccine. This is recommended every year.  Tetanus, diphtheria, and acellular pertussis (Tdap, Td) vaccine. You may need a Td booster every 10 years.  Varicella vaccine. You may need this if you have not been vaccinated.  Zoster vaccine. You may need this after age 60.  Measles, mumps, and rubella (MMR) vaccine. You may need at least one dose of MMR if you were born in 1957 or later. You may also need a second dose.  Pneumococcal 13-valent conjugate (PCV13) vaccine. One dose is recommended after age 65.  Pneumococcal polysaccharide (PPSV23) vaccine. One dose is recommended after age 65.  Meningococcal vaccine. You may need this if you have certain conditions.  Hepatitis A vaccine. You may need this if you have certain conditions or if you travel or work in places where you may be exposed to hepatitis   A.  Hepatitis B vaccine. You may need this if you have certain conditions or if you travel or work in places where you may be exposed to hepatitis B.  Haemophilus influenzae type b (Hib) vaccine. You may need this if you have certain conditions.  Talk to your health care provider about which screenings and vaccines you need and how often you  need them. This information is not intended to replace advice given to you by your health care provider. Make sure you discuss any questions you have with your health care provider. Document Released: 02/16/2015 Document Revised: 10/10/2015 Document Reviewed: 11/21/2014 Elsevier Interactive Patient Education  2018 [employer]  

## 2017-08-19 NOTE — Progress Notes (Signed)
Name: Natalie [name]   MRN: [id number]    DOB: 1950/12/22   Date:08/19/2017       Progress Note  Subjective  Chief Complaint  Chief Complaint  Patient presents with  . Annual Exam  . Hyperlipidemia    HPI   Patient presents for annual CPE and dyslipidemia  Dyslipidemia: she is on diet only, also sees psychiatrist and needs EKG because of medications she is currently taking. No chest pain or palpitation, she has been trying to eat healthier.   Exercise: not physically active, advised 150 minutes per week.   USPSTF grade A and B recommendations  Depression:  Depression screen [facility] 2/9 08/19/2017 04/30/2016 11/02/2015 08/03/2015 06/28/2015  Decreased Interest 0 0 0 0 0  Down, Depressed, Hopeless 1 1 0 0 0  PHQ - 2 Score 1 1 0 0 0  Altered sleeping 0 - - - -  Tired, decreased energy 0 - - - -  Change in appetite 1 - - - -  Feeling bad or failure about yourself  1 - - - -  Trouble concentrating 0 - - - -  Moving slowly or fidgety/restless 0 - - - -  Suicidal thoughts 0 - - - -  PHQ-9 Score 3 - - - -   Hypertension: BP Readings from Last 3 Encounters:  08/19/17 124/72  05/12/17 120/90  11/03/16 130/80   Obesity: Wt Readings from Last 3 Encounters:  08/19/17 225 lb 1.6 oz (102.1 kg)  05/12/17 235 lb 9.6 oz (106.9 kg)  11/03/16 223 lb 12.8 oz (101.5 kg)   BMI Readings from Last 3 Encounters:  08/19/17 36.33 kg/m  05/12/17 38.03 kg/m  11/03/16 35.05 kg/m     HIV, hep B: N/A  STD testing and prevention (chl/gon/syphilis): N/A Intimate partner violence: negative screen  Sexual History/Pain during Intercourse: not sexually active  Menstrual History/LMP/Abnormal Bleeding: s/p ablation, discussed risk of post-menopausal bleeding  Incontinence Symptoms:   Advanced Care Planning: A voluntary discussion about advance care planning including the explanation and discussion of advance directives.  Discussed health care proxy and Living will, and the patient was able to identify  a health care proxy as brother .  Patient does have a living will at present time.   Breast cancer: due for mammogram  BRCA gene screening: N/A Cervical cancer screening: repeat today, history of positive HPV   Osteoporosis:  Bone density is up to date  Lipids:  Lab Results  Component Value Date   CHOL 178 05/12/2017   CHOL 192 04/30/2016   CHOL 185 06/28/2015   Lab Results  Component Value Date   HDL 40 (L) 05/12/2017   HDL 44 (L) 04/30/2016   HDL 33 (L) 06/28/2015   Lab Results  Component Value Date   LDLCALC 121 (H) 05/12/2017   LDLCALC 135 (H) 04/30/2016   LDLCALC 131 (H) 06/28/2015   Lab Results  Component Value Date   TRIG 74 05/12/2017   TRIG 63 04/30/2016   TRIG 104 06/28/2015   Lab Results  Component Value Date   CHOLHDL 4.5 05/12/2017   CHOLHDL 4.4 04/30/2016   CHOLHDL 5.6 (H) 06/28/2015   No results found for: LDLDIRECT  Glucose:  Glucose  Date Value Ref Range Status  06/28/2015 91 65 - 99 mg/dL Final   Glucose, Bld  Date Value Ref Range Status  05/12/2017 83 65 - 139 mg/dL Final    Comment:    .        Non-fasting reference interval .  04/30/2016 71 65 - 99 mg/dL Final    Skin cancer: discussed atypical lesions  Colorectal cancer: repeat in 2020  Lung cancer:  Low Dose CT Chest recommended if Age 65-80 years, 30 pack-year currently smoking OR have quit w/in 15years. Patient does not qualify.   Aspirin: stop it  ECG: today for dyslipidemia.    Patient Active Problem List   Diagnosis Date Noted  . Morbid obesity, unspecified obesity type ([place]) 11/03/2016  . HPV test positive 07/08/2015  . Elevated blood sugar 06/28/2015  . Difficulty hearing 06/28/2015  . Arthritis, degenerative 06/28/2015  . Obesity, Class I, BMI 30-34.9 06/28/2015  . Paresthesia 06/28/2015  . Purpura, nonthrombopenic ([place]) 06/28/2015  . Skin lesion of right leg 06/28/2015  . Other specified hypothyroidism 08/09/2014  . Migraine without aura and without status  migrainosus, not intractable 09/19/2008  . Moderate major depression ([place]) 09/07/2006  . OP (osteoporosis) 09/07/2006    Past Surgical History:  Procedure Laterality Date  . ABLATION  2006    Family History  Problem Relation Age of Onset  . Early death Father        suicide  . Depression Father   . Diabetes Father   . Cancer Father        prostate  . Hearing loss Father        due to war  . Alzheimer's disease Mother   . Heart disease Brother   . Heart disease Maternal Aunt   . Dementia Maternal Aunt   . Heart disease Maternal Uncle   . Dementia Maternal Grandmother   . Heart disease Brother     Social History   Socioeconomic History  . Marital status: Single    Spouse name: Not on file  . Number of children: 1  . Years of education: Not on file  . Highest education level: Bachelor's degree (e.g., BA, AB, BS)  Occupational History  . Occupation: retired   Scientific laboratory technician  . Financial resource strain: Not hard at all  . Food insecurity:    Worry: Never true    Inability: Never true  . Transportation needs:    Medical: No    Non-medical: No  Tobacco Use  . Smoking status: Former Smoker    Packs/day: 1.00    Years: 6.00    Pack years: 6.00    Types: Cigarettes    Last attempt to quit: 02/11/1974    Years since quitting: 43.5  . Smokeless tobacco: Never Used  Substance and Sexual Activity  . Alcohol use: Yes    Alcohol/week: 0.0 oz    Comment: very seldom; socially  . Drug use: No  . Sexual activity: Not Currently  Lifestyle  . Physical activity:    Days per week: 0 days    Minutes per session: 0 min  . Stress: Not at all  Relationships  . Social connections:    Talks on phone: More than three times a week    Gets together: Once a week    Attends religious service: More than 4 times per year    Active member of club or organization: Yes    Attends meetings of clubs or organizations: More than 4 times per year    Relationship status: Never married  .  Intimate partner violence:    Fear of current or ex partner: No    Emotionally abused: No    Physically abused: No    Forced sexual activity: No  Other Topics Concern  . Not on  file  Social History Narrative   Raised an adopted child on her own   Working part time reviewed documents     Current Outpatient Medications:  .  buPROPion (WELLBUTRIN SR) 150 MG 12 hr tablet, Take 150 mg by mouth 2 (two) times daily., Disp: , Rfl:  .  citalopram (CELEXA) 40 MG tablet, Take 40 mg by mouth daily., Disp: , Rfl:  .  levothyroxine (SYNTHROID, LEVOTHROID) 75 MCG tablet, TAKE 1 TABLET BY MOUTH EVERY DAY, Disp: 90 tablet, Rfl: 0 .  SUMAtriptan (IMITREX) 100 MG tablet, Take 1 tablet (100 mg total) by mouth every 2 (two) hours as needed for migraine. May repeat in 2 hours if headache persists or recurs., Disp: 10 tablet, Rfl: 0 .  topiramate (TOPAMAX) 25 MG tablet, Take 25 mg by mouth daily. , Disp: , Rfl:  .  Vitamin D, Ergocalciferol, (DRISDOL) 50000 units CAPS capsule, TAKE ONE CAPSULE BY MOUTH EVERY 7 DAYS, Disp: 12 capsule, Rfl: 0  Allergies  Allergen Reactions  . Sulfamethoxazole-Trimethoprim      ROS   Constitutional: Negative for fever, positive for  weight change.  Respiratory: Negative for cough and shortness of breath.   Cardiovascular: Negative for chest pain or palpitations.  Gastrointestinal: Negative for abdominal pain, no bowel changes. She has some blood when she wipes - occasionally - sometimes drips on top of stools, history of hemorrhoids, Discussed early referral for colonoscopy  Musculoskeletal: Negative for gait problem or joint swelling.  Skin: Negative for rash.  Neurological: Negative for dizziness or headache.  No other specific complaints in a complete review of systems (except as listed in HPI above).   Objective  Vitals:   08/19/17 1338  BP: 124/72  Pulse: 96  Resp: 16  Temp: 98.9 F (37.2 C)  TempSrc: Oral  SpO2: 97%  Weight: 225 lb 1.6 oz (102.1 kg)   Height: 5' 6"  (1.676 m)    Body mass index is 36.33 kg/m.  Physical Exam  Constitutional: Patient appears well-developed and well-nourished. No distress.  HENT: Head: Normocephalic and atraumatic. Ears: B TMs ok, no erythema or effusion; Nose: Nose normal. Mouth/Throat: Oropharynx is clear and moist. No oropharyngeal exudate.  Eyes: Conjunctivae and EOM are normal. Pupils are equal, round, and reactive to light. No scleral icterus.  Neck: Normal range of motion. Neck supple. No JVD present. No thyromegaly present.  Cardiovascular: Normal rate, regular rhythm and normal heart sounds.  No murmur heard. No BLE edema. Pulmonary/Chest: Effort normal and breath sounds normal. No respiratory distress. Abdominal: Soft. Bowel sounds are normal, no distension. There is no tenderness. no masses Breast: no lumps or masses, no nipple discharge or rashes FEMALE GENITALIA:  External genitalia normal External urethra normal Vaginal walls showed atrophy  Cervix normal without discharge or lesions Bimanual exam normal without masses RECTAL: skin tag Musculoskeletal: Normal range of motion, no joint effusions. No gross deformities Neurological: he is alert and oriented to person, place, and time. No cranial nerve deficit. Coordination, balance, strength, speech and gait are normal.  Skin: Skin is warm and dry. No rash noted. No erythema.  Psychiatric: Patient has a normal mood and affect. behavior is normal. Judgment and thought content normal.   PHQ2/9: Depression screen [facility] 2/9 08/19/2017 04/30/2016 11/02/2015 08/03/2015 06/28/2015  Decreased Interest 0 0 0 0 0  Down, Depressed, Hopeless 1 1 0 0 0  PHQ - 2 Score 1 1 0 0 0  Altered sleeping 0 - - - -  Tired, decreased energy 0 - - - -  Change in appetite 1 - - - -  Feeling bad or failure about yourself  1 - - - -  Trouble concentrating 0 - - - -  Moving slowly or fidgety/restless 0 - - - -  Suicidal thoughts 0 - - - -  PHQ-9 Score 3 - - - -      Fall Risk: Fall Risk  08/19/2017 05/12/2017 05/12/2017 05/12/2017 11/03/2016  Falls in the past year? No No Yes No -  Number falls in past yr: - - - - -  Injury with Fall? - - Yes - Yes  Comment - - - - -  Risk for fall due to : - - History of fall(s) - History of fall(s)  Risk for fall due to: Comment - - - - -  Follow up - - - - Education provided     Assessment & Plan  1. Encounter for preventive care  Discussed importance of 150 minutes of physical activity weekly, eat two servings of fish weekly, eat one serving of tree nuts ( cashews, pistachios, pecans, almonds.Marland Kitchen) every other day, eat 6 servings of fruit/vegetables daily and drink plenty of water and avoid sweet beverages.   2. Dyslipidemia  - EKG 12-Lead  3. Cervical high risk human papillomavirus (HPV) DNA test positive  - Cytology - PAP  5. Encounter for screening mammogram for breast cancer  - MM Digital Screening; Future  5. Screen for colon cancer  - Ambulatory referral to Gastroenterology   6. B12 deficiency  - cyanocobalamin ((VITAMIN B-12)) injection 1,000 mcg  7. Abnormal EKG  - Ambulatory referral to Cardiology- seen by Dr. Clayborn Bigness in the past

## 2017-08-21 LAB — CYTOLOGY - PAP
Diagnosis: NEGATIVE
HPV: DETECTED — AB

## 2017-08-26 DIAGNOSIS — E6609 Other obesity due to excess calories: Secondary | ICD-10-CM | POA: Diagnosis not present

## 2017-08-26 DIAGNOSIS — Z6836 Body mass index (BMI) 36.0-36.9, adult: Secondary | ICD-10-CM | POA: Diagnosis not present

## 2017-08-26 DIAGNOSIS — R9431 Abnormal electrocardiogram [ECG] [EKG]: Secondary | ICD-10-CM | POA: Diagnosis not present

## 2017-08-26 DIAGNOSIS — R0602 Shortness of breath: Secondary | ICD-10-CM | POA: Diagnosis not present

## 2017-08-26 DIAGNOSIS — I10 Essential (primary) hypertension: Secondary | ICD-10-CM | POA: Diagnosis not present

## 2017-08-26 DIAGNOSIS — E782 Mixed hyperlipidemia: Secondary | ICD-10-CM | POA: Diagnosis not present

## 2017-08-28 ENCOUNTER — Other Ambulatory Visit: Payer: Self-pay | Admitting: Family Medicine

## 2017-08-28 DIAGNOSIS — G43001 Migraine without aura, not intractable, with status migrainosus: Secondary | ICD-10-CM

## 2017-09-07 ENCOUNTER — Encounter: Payer: Self-pay | Admitting: *Deleted

## 2017-09-09 DIAGNOSIS — F330 Major depressive disorder, recurrent, mild: Secondary | ICD-10-CM | POA: Diagnosis not present

## 2017-10-20 ENCOUNTER — Other Ambulatory Visit: Payer: Self-pay | Admitting: Family Medicine

## 2017-10-20 DIAGNOSIS — E038 Other specified hypothyroidism: Secondary | ICD-10-CM

## 2017-11-11 ENCOUNTER — Ambulatory Visit: Payer: Medicare Other | Admitting: Family Medicine

## 2017-11-17 ENCOUNTER — Ambulatory Visit: Payer: Medicare Other | Admitting: Family Medicine

## 2017-11-24 ENCOUNTER — Ambulatory Visit (INDEPENDENT_AMBULATORY_CARE_PROVIDER_SITE_OTHER): Payer: Medicare Other | Admitting: Family Medicine

## 2017-11-24 ENCOUNTER — Encounter: Payer: Self-pay | Admitting: Family Medicine

## 2017-11-24 ENCOUNTER — Ambulatory Visit: Payer: Medicare Other

## 2017-11-24 VITALS — BP 118/70 | HR 83 | Temp 98.4°F | Resp 16 | Ht 66.0 in | Wt 233.0 lb

## 2017-11-24 DIAGNOSIS — E038 Other specified hypothyroidism: Secondary | ICD-10-CM | POA: Diagnosis not present

## 2017-11-24 DIAGNOSIS — Z23 Encounter for immunization: Secondary | ICD-10-CM | POA: Diagnosis not present

## 2017-11-24 DIAGNOSIS — E559 Vitamin D deficiency, unspecified: Secondary | ICD-10-CM | POA: Diagnosis not present

## 2017-11-24 DIAGNOSIS — E538 Deficiency of other specified B group vitamins: Secondary | ICD-10-CM | POA: Diagnosis not present

## 2017-11-24 DIAGNOSIS — D692 Other nonthrombocytopenic purpura: Secondary | ICD-10-CM

## 2017-11-24 DIAGNOSIS — G43001 Migraine without aura, not intractable, with status migrainosus: Secondary | ICD-10-CM | POA: Diagnosis not present

## 2017-11-24 DIAGNOSIS — M810 Age-related osteoporosis without current pathological fracture: Secondary | ICD-10-CM

## 2017-11-24 DIAGNOSIS — F321 Major depressive disorder, single episode, moderate: Secondary | ICD-10-CM | POA: Diagnosis not present

## 2017-11-24 DIAGNOSIS — E785 Hyperlipidemia, unspecified: Secondary | ICD-10-CM

## 2017-11-24 DIAGNOSIS — E669 Obesity, unspecified: Secondary | ICD-10-CM

## 2017-11-24 MED ORDER — VITAMIN D (ERGOCALCIFEROL) 1.25 MG (50000 UNIT) PO CAPS: 50000.0000 [IU] | ORAL_CAPSULE | ORAL | 1 refills | Status: DC

## 2017-11-24 MED ORDER — LEVOTHYROXINE SODIUM 75 MCG PO TABS: 75.0000 ug | ORAL_TABLET | Freq: Every day | ORAL | 1 refills | Status: DC

## 2017-11-24 NOTE — Patient Instructions (Signed)
Check coverage of shingles vaccine with insurance  Discussed sleep hygiene  B12, only take it a few times a week

## 2017-11-24 NOTE — Progress Notes (Signed)
Name: Natalie [name]   MRN: [id number]    DOB: 1950-05-03   Date:11/24/2017       Progress Note  Subjective  Chief Complaint  Chief Complaint  Patient presents with  . Follow-up  . Hypothyroidism    Very Fatigue  . Migraine    Had one in the past couple of weeks, nausea, light sensivity  . Depression  . Dyslipidemia    HPI  Osteoporosis: on right femur, she is fair skin, family history of osteoporosis. She would like to hold off on all medications at this time, she wants to think about it. She has increased calcium in her diet.Continue Vitamin D level   B12 deficiency: last B12 was high last visit, previously it was low. She has been taking supplements intermittently   Hypothyroidism: she has been taking medication as prescribed, she has noticed that her hair is thinning , she has chronic dry skin, no constipation. TSH has been stable for a long time, recheck yearly   Dyslipidemia: she does not want to start statin therapy,last LDL was 121, HDL was 40.   Low blood pressure: not on medication for bp, feeling a little tired and oozy, but states had a lot of vomiting with episode of migraine last week and has been tired since, also not sleeping well, we will monitor.   OA: taking  Tylenol prn , off meloxicam and is doing well at this time, right knee more than left   Moderate Depression: still sees Dr. Thurmond Butts, states mood has been stable, compliant with medication,she states never in remission, not seeing therapist, she denies suicidal thoughts or ideation. Pha9 is 4 today   Migraine headaches: she is on Topamax she states that episodes are on average 1 in a month, doing better. Pain is described as right temporal or parietal area, piercing like and at times aching but other times feels like a band on top of her head. She states Imitrex resolves symptoms . She had a severe episode last week , developed nausea and vomting.    Obesity: weight has gone up since last visit,  she states she is a emotional eater. She gained 8 pounds since last visit    Patient Active Problem List   Diagnosis Date Noted  . Morbid obesity, unspecified obesity type ([place]) 11/03/2016  . HPV test positive 07/08/2015  . Elevated blood sugar 06/28/2015  . Difficulty hearing 06/28/2015  . Arthritis, degenerative 06/28/2015  . Obesity, Class I, BMI 30-34.9 06/28/2015  . Paresthesia 06/28/2015  . Purpura, nonthrombopenic ([place]) 06/28/2015  . Skin lesion of right leg 06/28/2015  . Other specified hypothyroidism 08/09/2014  . Migraine without aura and without status migrainosus, not intractable 09/19/2008  . Moderate major depression ([place]) 09/07/2006  . OP (osteoporosis) 09/07/2006    Past Surgical History:  Procedure Laterality Date  . ABLATION  2006    Family History  Problem Relation Age of Onset  . Early death Father        suicide  . Depression Father   . Diabetes Father   . Cancer Father        prostate  . Hearing loss Father        due to war  . Alzheimer's disease Mother   . Heart disease Brother   . Heart disease Maternal Aunt   . Dementia Maternal Aunt   . Heart disease Maternal Uncle   . Dementia Maternal Grandmother   . Heart disease Brother     Social History  Socioeconomic History  . Marital status: Single    Spouse name: Not on file  . Number of children: 1  . Years of education: Not on file  . Highest education level: Bachelor's degree (e.g., BA, AB, BS)  Occupational History  . Occupation: retired   Scientific laboratory technician  . Financial resource strain: Not hard at all  . Food insecurity:    Worry: Never true    Inability: Never true  . Transportation needs:    Medical: No    Non-medical: No  Tobacco Use  . Smoking status: Former Smoker    Packs/day: 1.00    Years: 6.00    Pack years: 6.00    Types: Cigarettes    Last attempt to quit: 02/11/1974    Years since quitting: 43.8  . Smokeless tobacco: Never Used  Substance and Sexual Activity  .  Alcohol use: Yes    Alcohol/week: 0.0 standard drinks    Comment: very seldom; socially  . Drug use: No  . Sexual activity: Not Currently  Lifestyle  . Physical activity:    Days per week: 0 days    Minutes per session: 0 min  . Stress: Not at all  Relationships  . Social connections:    Talks on phone: More than three times a week    Gets together: Once a week    Attends religious service: More than 4 times per year    Active member of club or organization: Yes    Attends meetings of clubs or organizations: More than 4 times per year    Relationship status: Never married  . Intimate partner violence:    Fear of current or ex partner: No    Emotionally abused: No    Physically abused: No    Forced sexual activity: No  Other Topics Concern  . Not on file  Social History Narrative   Raised an adopted child on her own   Working part time reviewed documents     Current Outpatient Medications:  .  buPROPion (WELLBUTRIN SR) 150 MG 12 hr tablet, Take 150 mg by mouth 2 (two) times daily., Disp: , Rfl:  .  citalopram (CELEXA) 40 MG tablet, Take 40 mg by mouth daily., Disp: , Rfl:  .  levothyroxine (SYNTHROID, LEVOTHROID) 75 MCG tablet, Take 1 tablet (75 mcg total) by mouth daily., Disp: 90 tablet, Rfl: 1 .  SUMAtriptan (IMITREX) 100 MG tablet, TAKE 1 TABLET BY MOUTH EVERY 2 HOURS AS NEEDED FOR MIGRAINE. MAY REPEAT IN 2 HOURS IF HEADACHE PERSISTS OR RECURS., Disp: 10 tablet, Rfl: 0 .  topiramate (TOPAMAX) 25 MG tablet, Take 25 mg by mouth daily. , Disp: , Rfl:  .  Vitamin D, Ergocalciferol, (DRISDOL) 50000 units CAPS capsule, Take 1 capsule (50,000 Units total) by mouth every 7 (seven) days., Disp: 12 capsule, Rfl: 1  Allergies  Allergen Reactions  . Sulfamethoxazole-Trimethoprim     I personally reviewed active problem list, medication list, allergies, family history, social history with the patient/caregiver today.   ROS  Constitutional: Negative for fever, positive weight  change.  Respiratory: Negative for cough and shortness of breath.   Cardiovascular: Negative for chest pain or palpitations.  Gastrointestinal: Negative for abdominal pain, no bowel changes.  Musculoskeletal: Negative for gait problem or joint swelling.  Skin: Negative for rash.  Neurological: Negative for dizziness, positive for intermittent symptoms of  headache.  No other specific complaints in a complete review of systems (except as listed in HPI above).  Objective  Vitals:   11/24/17 1342  BP: 118/70  Pulse: 83  Resp: 16  Temp: 98.4 F (36.9 C)  TempSrc: Oral  SpO2: 99%  Weight: 233 lb (105.7 kg)  Height: 5\' 6"  (1.676 m)    Body mass index is 37.61 kg/m.  Physical Exam  Constitutional: Patient appears well-developed and well-nourished. Obese No distress.  HEENT: head atraumatic, normocephalic, pupils equal and reactive to light, neck supple, throat within normal limits. No thyromegaly  Cardiovascular: Normal rate, regular rhythm and normal heart sounds.  No murmur heard. Trace  BLE edema. Pulmonary/Chest: Effort normal and breath sounds normal. No respiratory distress. Abdominal: Soft.  There is no tenderness. Psychiatric: Patient has a normal mood and affect. behavior is normal. Judgment and thought content normal.  PHQ2/9: Depression screen [facility] 2/9 11/24/2017 08/19/2017 04/30/2016 11/02/2015 08/03/2015  Decreased Interest 1 0 0 0 0  Down, Depressed, Hopeless 0 1 1 0 0  PHQ - 2 Score 1 1 1  0 0  Altered sleeping 1 0 - - -  Tired, decreased energy 1 0 - - -  Change in appetite 1 1 - - -  Feeling bad or failure about yourself  0 1 - - -  Trouble concentrating 0 0 - - -  Moving slowly or fidgety/restless 0 0 - - -  Suicidal thoughts 0 0 - - -  PHQ-9 Score 4 3 - - -     Fall Risk: Fall Risk  11/24/2017 08/19/2017 05/12/2017 05/12/2017 05/12/2017  Falls in the past year? No No No Yes No  Number falls in past yr: - - - - -  Injury with Fall? - - - Yes -  Comment - - - - -   Risk for fall due to : - - - History of fall(s) -  Risk for fall due to: Comment - - - - -  Follow up - - - - -     Functional Status Survey: Is the patient deaf or have difficulty hearing?: No Does the patient have difficulty seeing, even when wearing glasses/contacts?: Yes Does the patient have difficulty concentrating, remembering, or making decisions?: No Does the patient have difficulty walking or climbing stairs?: No Does the patient have difficulty dressing or bathing?: No Does the patient have difficulty doing errands alone such as visiting a doctor's office or shopping?: No    Assessment & Plan  1. Migraine without aura and with status migrainosus, not intractable  Still has some Imitrex at home   2. Need for immunization against influenza  - Flu vaccine HIGH DOSE PF (Fluzone High dose)  3. Other specified hypothyroidism  - levothyroxine (SYNTHROID, LEVOTHROID) 75 MCG tablet; Take 1 tablet (75 mcg total) by mouth daily.  Dispense: 90 tablet; Refill: 1  4. Vitamin D deficiency  - Vitamin D, Ergocalciferol, (DRISDOL) 50000 units CAPS capsule; Take 1 capsule (50,000 Units total) by mouth every 7 (seven) days.  Dispense: 12 capsule; Refill: 1   5. Purpura, nonthrombopenic ([place])  stable  6. Moderate major depression ([place])  Under the care of psychiatrist , she has not been sleeping well at night, watching TV and feeling "weird " during the day, advised to avoid napping after 1 pm, better sleep hygiene, drink more fluids    7. B12 deficiency  Continue medication a few times a week  8. Dyslipidemia  On life style modification only  9. Age-related osteoporosis without current pathological fracture  Refuses medication  10. Obesity, Class I, BMI 30-34.9  Discussed with  the patient the risk posed by an increased BMI. Discussed importance of portion control, calorie counting and at least 150 minutes of physical activity weekly. Avoid sweet beverages and drink  more water. Eat at least 6 servings of fruit and vegetables daily

## 2017-12-07 DIAGNOSIS — F330 Major depressive disorder, recurrent, mild: Secondary | ICD-10-CM | POA: Diagnosis not present

## 2017-12-15 ENCOUNTER — Ambulatory Visit: Payer: Medicare Other

## 2017-12-20 ENCOUNTER — Other Ambulatory Visit: Payer: Self-pay

## 2017-12-20 ENCOUNTER — Encounter: Payer: Self-pay | Admitting: Emergency Medicine

## 2017-12-20 ENCOUNTER — Emergency Department
Admission: EM | Admit: 2017-12-20 | Discharge: 2017-12-20 | Disposition: A | Discharge status: Home / Self Care | Payer: Medicare Other | Attending: Emergency Medicine | Admitting: Emergency Medicine

## 2017-12-20 ENCOUNTER — Emergency Department: Payer: Medicare Other

## 2017-12-20 DIAGNOSIS — E039 Hypothyroidism, unspecified: Secondary | ICD-10-CM | POA: Insufficient documentation

## 2017-12-20 DIAGNOSIS — Z87891 Personal history of nicotine dependence: Secondary | ICD-10-CM | POA: Insufficient documentation

## 2017-12-20 DIAGNOSIS — H5350 Unspecified color vision deficiencies: Secondary | ICD-10-CM | POA: Insufficient documentation

## 2017-12-20 DIAGNOSIS — R519 Headache, unspecified: Secondary | ICD-10-CM

## 2017-12-20 DIAGNOSIS — Z79899 Other long term (current) drug therapy: Secondary | ICD-10-CM | POA: Insufficient documentation

## 2017-12-20 DIAGNOSIS — R51 Headache: Secondary | ICD-10-CM

## 2017-12-20 DIAGNOSIS — H538 Other visual disturbances: Secondary | ICD-10-CM | POA: Diagnosis not present

## 2017-12-20 LAB — CBC
HCT: 43.3 % (ref 36.0–46.0)
Hemoglobin: 13.9 g/dL (ref 12.0–15.0)
MCH: 31.3 pg (ref 26.0–34.0)
MCHC: 32.1 g/dL (ref 30.0–36.0)
MCV: 97.5 fL (ref 80.0–100.0)
Platelets: 225 10*3/uL (ref 150–400)
RBC: 4.44 MIL/uL (ref 3.87–5.11)
RDW: 12.2 % (ref 11.5–15.5)
WBC: 5 10*3/uL (ref 4.0–10.5)
nRBC: 0 % (ref 0.0–0.2)

## 2017-12-20 LAB — BASIC METABOLIC PANEL
Anion gap: 7 (ref 5–15)
BUN: 21 mg/dL (ref 8–23)
CO2: 26 mmol/L (ref 22–32)
Calcium: 8.8 mg/dL — ABNORMAL LOW (ref 8.9–10.3)
Chloride: 109 mmol/L (ref 98–111)
Creatinine, Ser: 0.72 mg/dL (ref 0.44–1.00)
GFR calc Af Amer: >60 mL/min (ref >60)
GFR calc non Af Amer: >60 mL/min (ref >60)
Glucose, Bld: 98 mg/dL (ref 70–99)
Potassium: 3.7 mmol/L (ref 3.5–5.1)
Sodium: 142 mmol/L (ref 135–145)

## 2017-12-20 IMAGING — CT CT HEAD W/O CM
3 series · 15 of 46 positions shown, 18 images · non-contrast
Comparison: [DATE]

CLINICAL DATA: Headache.

EXAM:
CT HEAD WITHOUT CONTRAST
TECHNIQUE: Contiguous axial images were obtained from the base of the skull
through the vertex without intravenous contrast.

[Series 2: head wo · axial · 0.39mm/px · z∈[-72,+48]mm · 9 of 29 slices shown, 12 images]
[im 3/29  brain]
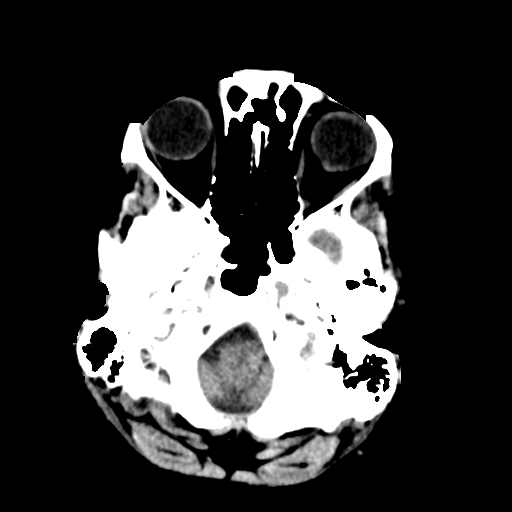
[im 3/29  bone]
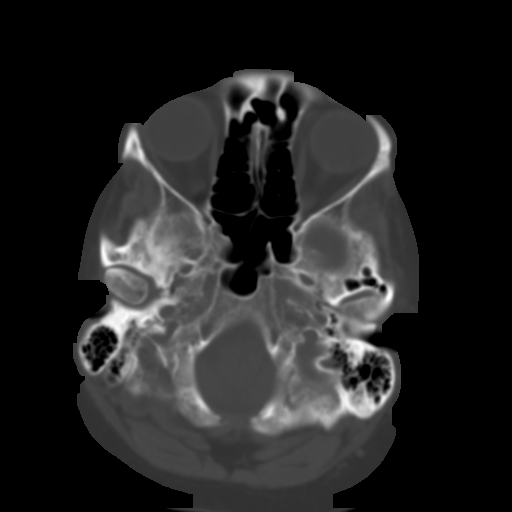
[im 6/29  brain]
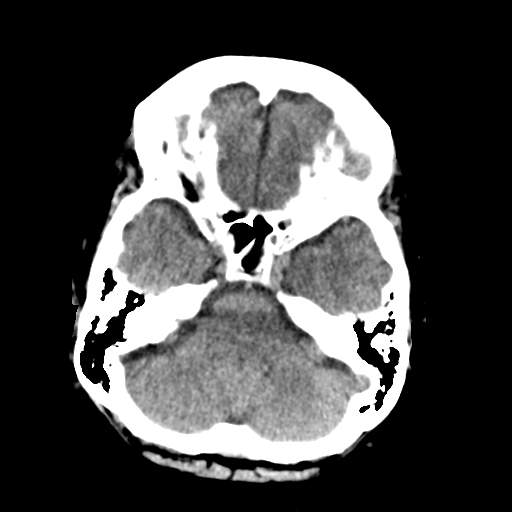
[im 9/29  brain]
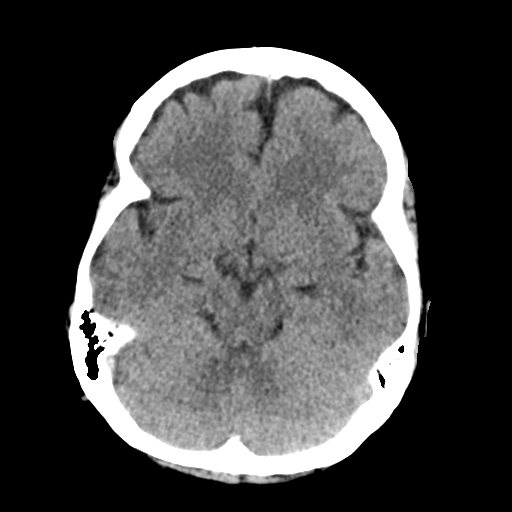
[im 12/29  brain]
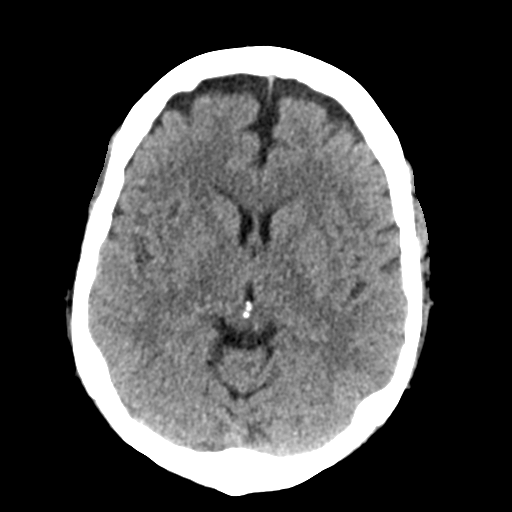
[im 15/29  brain]
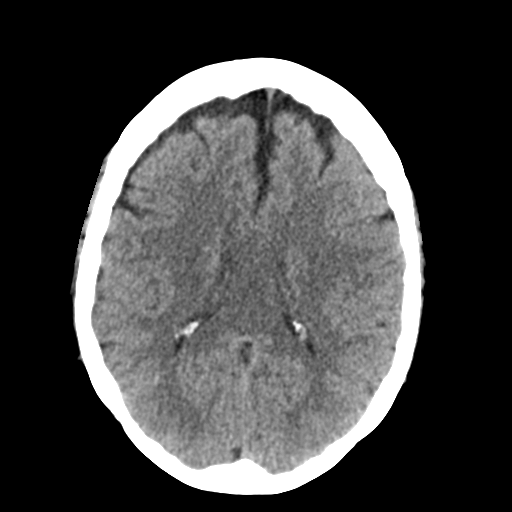
[im 15/29  bone]
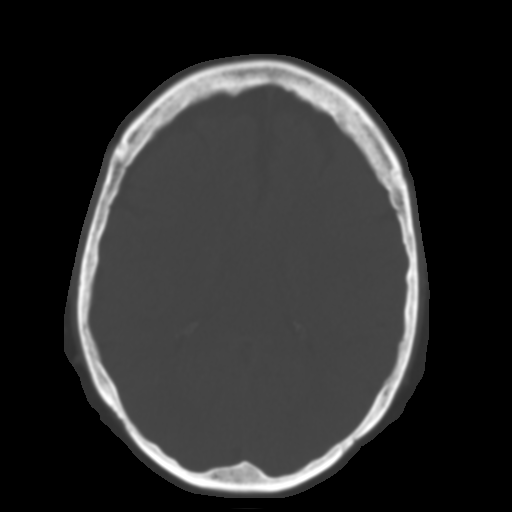
[im 18/29  brain]
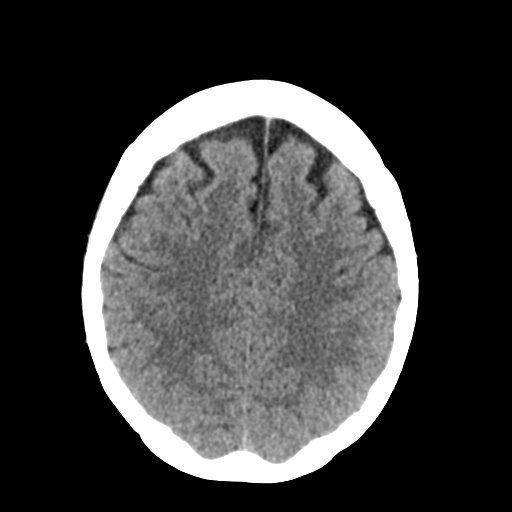
[im 21/29  brain]
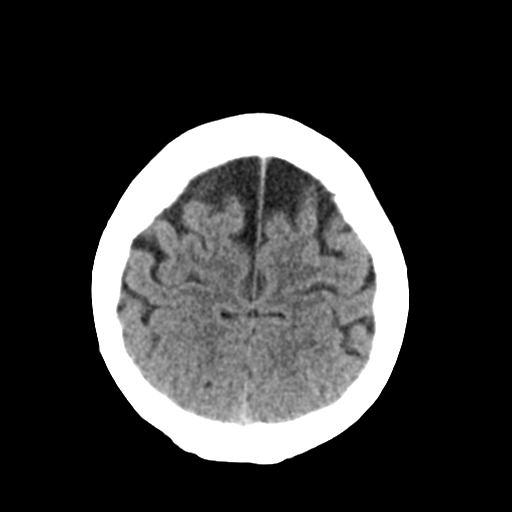
[im 24/29  brain]
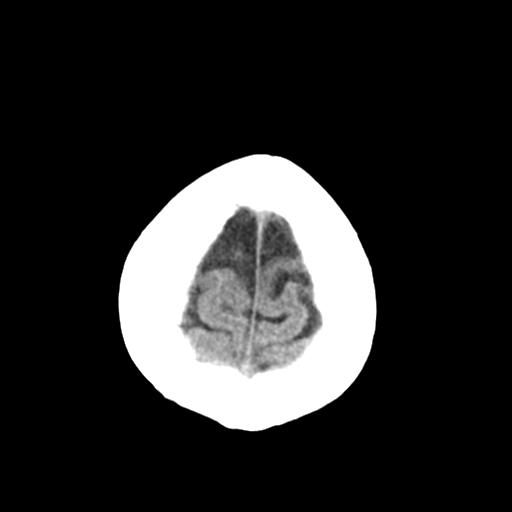
[im 27/29  brain]
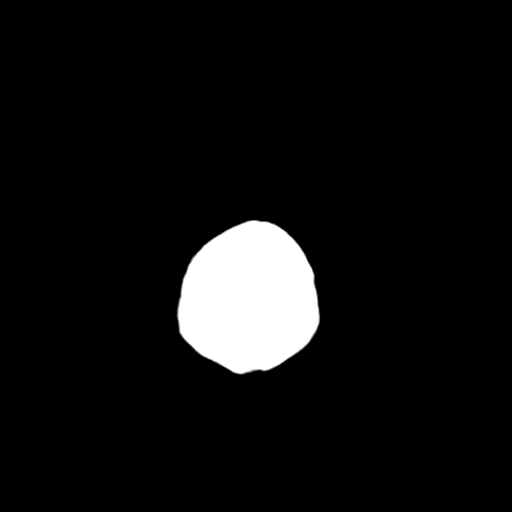
[im 27/29  bone]
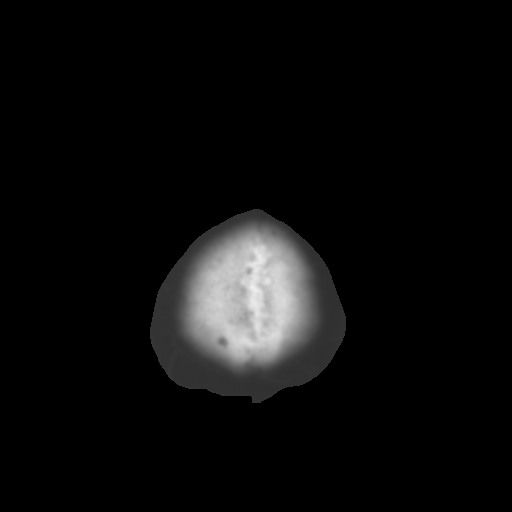

[Series 4: coronal soft tissue · coronal · 0.29mm/px · 3 of 61 slices shown]
[im 21/61  brain]
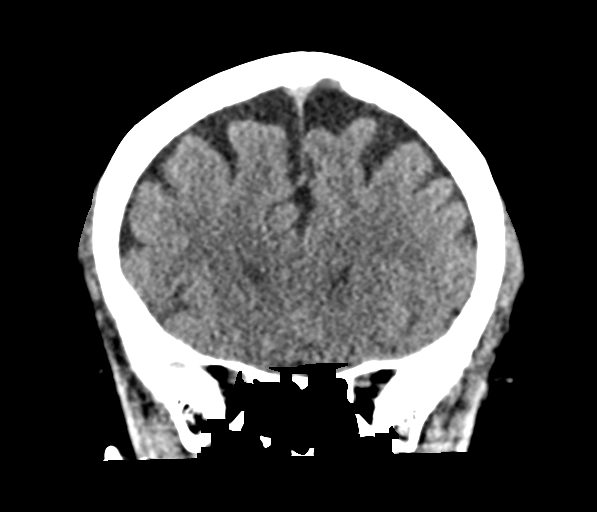
[im 27/61  brain]
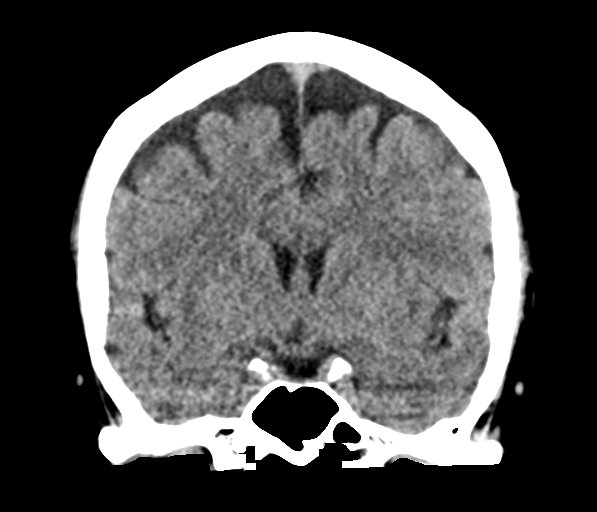
[im 34/61  brain]
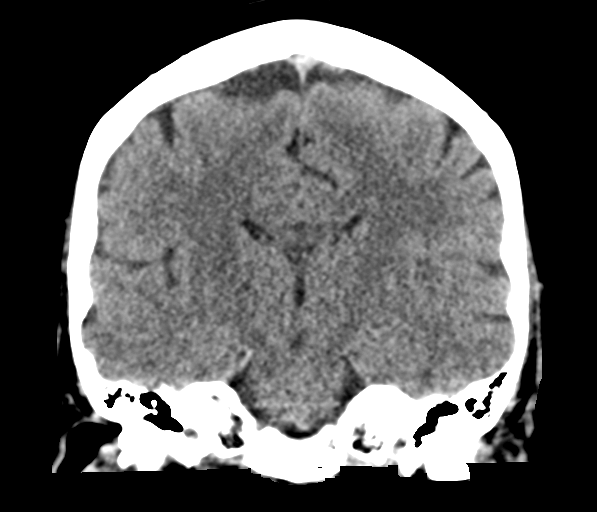

[Series 5: sagittal soft tissue · sagittal · 0.29mm/px · 3 of 48 slices shown]
[im 16/48  brain]
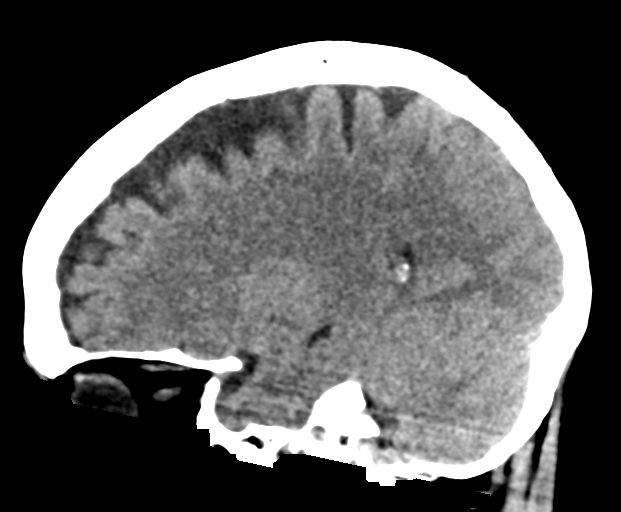
[im 24/48  brain]
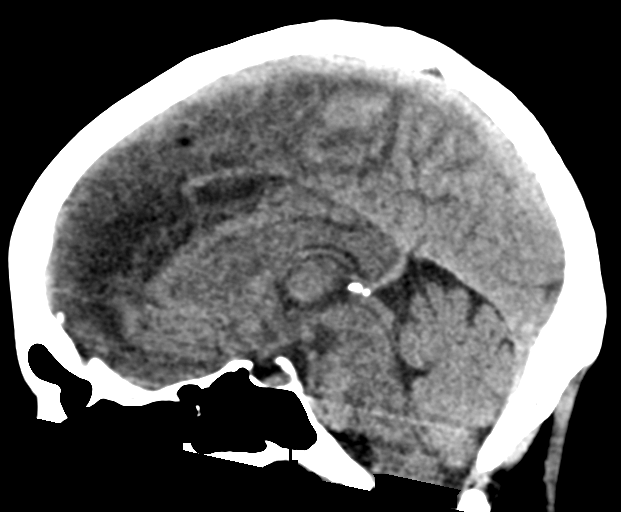
[im 32/48  brain]
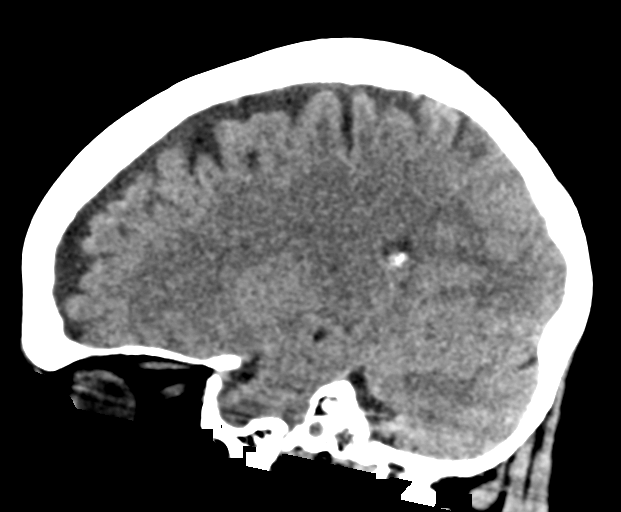

[15 of 46 positions shown; findings below may reference images not displayed]

FINDINGS: Brain: There is no evidence for acute hemorrhage, hydrocephalus,
mass lesion, or abnormal extra-axial fluid collection. No definite
CT evidence for acute infarction. Diffuse loss of parenchymal volume
is consistent with atrophy.

Vascular: No hyperdense vessel or unexpected calcification.

Skull: No evidence for fracture. No worrisome lytic or sclerotic
lesion.

Sinuses/Orbits: The visualized paranasal sinuses and mastoid air
cells are clear. Visualized portions of the globes and intraorbital
fat are unremarkable.

Other: None.
IMPRESSION: No acute intracranial abnormality.

## 2017-12-20 NOTE — ED Provider Notes (Signed)
[facility] Emergency Department Provider Note  ____________________________________________   I have reviewed the triage vital signs and the nursing notes.   HISTORY  Chief Complaint Vision change  History limited by: Not Limited   HPI Natalie [name] is a 67 y.o. female who presents to the emergency department today because of concern for an episode of vision change that occurred yesterday and headache. The patient states that the symptoms started when she was yelling at her dog. She had sudden onset of right sided headache and vision change. She describes the vision change as consisting of colors that blocked her vision. It was only her right eye. The vision changes lasted for roughly 5-10 minutes. She states that the headache also has improved but is still slightly there. She does have a history of migraines but denies similar symptoms like this in the past. Denies any extremity weakness or numbness.   Per medical record review patient has a history of migraine.   Past Medical History:  Diagnosis Date  . Acute bronchospasm due to viral infection   . Anemia    history of  . Attention deficit disorder with hyperactivity   . Blood pressure elevated without history of HTN   . Depressive disorder   . Epistaxis   . Fatigue   . Hearing loss   . Hypothyroidism   . Memory change   . Migraine   . Numbness and tingling   . Obesity (BMI 30.0-34.9)   . Osteoarthritis   . Osteoporosis   . Polyuria   . Premature menopause   . Seizures ([place])    history of mini seizures, possible migraine induced    Patient Active Problem List   Diagnosis Date Noted  . Morbid obesity, unspecified obesity type ([place]) 11/03/2016  . HPV test positive 07/08/2015  . Elevated blood sugar 06/28/2015  . Difficulty hearing 06/28/2015  . Arthritis, degenerative 06/28/2015  . Obesity, Class I, BMI 30-34.9 06/28/2015  . Paresthesia 06/28/2015  . Purpura, nonthrombopenic ([place])  06/28/2015  . Skin lesion of right leg 06/28/2015  . Other specified hypothyroidism 08/09/2014  . Migraine without aura and without status migrainosus, not intractable 09/19/2008  . Moderate major depression ([place]) 09/07/2006  . OP (osteoporosis) 09/07/2006    Past Surgical History:  Procedure Laterality Date  . ABLATION  2006    Prior to Admission medications   Medication Sig Start Date End Date Taking? Authorizing Provider  buPROPion (WELLBUTRIN SR) 150 MG 12 hr tablet Take 150 mg by mouth 2 (two) times daily.    Lew Dawes, MD  citalopram (CELEXA) 40 MG tablet Take 40 mg by mouth daily.    Lew Dawes, MD  levothyroxine (SYNTHROID, LEVOTHROID) 75 MCG tablet Take 1 tablet (75 mcg total) by mouth daily. 11/24/17   Sowles, Drue Stager, MD  SUMAtriptan (IMITREX) 100 MG tablet TAKE 1 TABLET BY MOUTH EVERY 2 HOURS AS NEEDED FOR MIGRAINE. MAY REPEAT IN 2 HOURS IF HEADACHE PERSISTS OR RECURS. 08/28/17   [name], MD  topiramate (TOPAMAX) 25 MG tablet Take 25 mg by mouth daily.     Lew Dawes, MD  Vitamin D, Ergocalciferol, (DRISDOL) 50000 units CAPS capsule Take 1 capsule (50,000 Units total) by mouth every 7 (seven) days. 11/24/17   [name], MD    Allergies Sulfamethoxazole-trimethoprim  Family History  Problem Relation Age of Onset  . Early death Father        suicide  . Depression Father   . Diabetes  Father   . Cancer Father        prostate  . Hearing loss Father        due to war  . Alzheimer's disease Mother   . Heart disease Brother   . Heart disease Maternal Aunt   . Dementia Maternal Aunt   . Heart disease Maternal Uncle   . Dementia Maternal Grandmother   . Heart disease Brother     Social History Social History   Tobacco Use  . Smoking status: Former Smoker    Packs/day: 1.00    Years: 6.00    Pack years: 6.00    Types: Cigarettes    Last attempt to quit: 02/11/1974    Years since quitting: 43.8  . Smokeless tobacco: Never Used   Substance Use Topics  . Alcohol use: Yes    Alcohol/week: 0.0 standard drinks    Comment: very seldom; socially  . Drug use: No    Review of Systems Constitutional: No fever/chills Eyes: Positive for vision change in right eye ENT: No sore throat. Cardiovascular: Denies chest pain. Respiratory: Denies shortness of breath. Gastrointestinal: No abdominal pain.  No nausea, no vomiting.  No diarrhea.   Genitourinary: Negative for dysuria. Musculoskeletal: Negative for back pain. Skin: Negative for rash. Neurological: Positive for headache.  ____________________________________________   PHYSICAL EXAM:  VITAL SIGNS: ED Triage Vitals  Enc Vitals Group     BP 12/20/17 1411 (!) 158/83     Pulse Rate 12/20/17 1411 80     Resp 12/20/17 1411 18     Temp 12/20/17 1411 97.6 F (36.4 C)     Temp Source 12/20/17 1411 Oral     SpO2 12/20/17 1411 95 %     Weight 12/20/17 1412 230 lb (104.3 kg)     Height 12/20/17 1412 5\' 6"  (1.676 m)     Head Circumference --      Peak Flow --      Pain Score 12/20/17 1412 3   Constitutional: Alert and oriented.  Eyes: Conjunctivae are normal. PERRL. EOMI. Normal red reflex.  ENT      Head: Normocephalic and atraumatic.      Nose: No congestion/rhinnorhea.      Mouth/Throat: Mucous membranes are moist.      Neck: No stridor. Hematological/Lymphatic/Immunilogical: No cervical lymphadenopathy. Cardiovascular: Normal rate, regular rhythm.  No murmurs, rubs, or gallops.  Respiratory: Normal respiratory effort without tachypnea nor retractions. Breath sounds are clear and equal bilaterally. No wheezes/rales/rhonchi. Gastrointestinal: Soft and non tender. No rebound. No guarding.  Genitourinary: Deferred Musculoskeletal: Normal range of motion in all extremities. No lower extremity edema. Neurologic:  Normal speech and language. No gross focal neurologic deficits are appreciated.  Skin:  Skin is warm, dry and intact. No rash noted. Psychiatric: Mood  and affect are normal. Speech and behavior are normal. Patient exhibits appropriate insight and judgment.  ____________________________________________    LABS (pertinent positives/negatives)  CBC wbc 5.0, hgb 13.9, plt 225 BMP wnl except ca 8.8  ____________________________________________   EKG  None  ____________________________________________    RADIOLOGY  CT head No acute abnormality   ____________________________________________   PROCEDURES  Procedures  ____________________________________________   INITIAL IMPRESSION / ASSESSMENT AND PLAN / ED COURSE  Pertinent labs & imaging results that were available during my care of the patient were reviewed by me and considered in my medical decision making (see chart for details).   Patient presented to the emergency department today because of concerns for vision change and  some continued headache.  The symptoms did occur yesterday.  She states she was on her dog when they started.  Patient describes the vision changes being the addition of colors to her vision.  Had a discussion with the patient.  At this point I would have a low suspicion for TIA or stroke given the fact that it was not an absence of vision instead it was the addition of color.  Additionally patient without any other strokelike symptoms.  She does have a history of migraines I do wonder if ocular migraine is likely the culprit.  Discussed that we could obtain an MRI however I do have low suspicion that this would show a concerning finding.  Patient did feel comfortable deferring.  Will plan on following up with primary care.  ____________________________________________   FINAL CLINICAL IMPRESSION(S) / ED DIAGNOSES  Final diagnoses:  Visual color changes  Nonintractable headache, unspecified chronicity pattern, unspecified headache type     Note: This dictation was prepared with Dragon dictation. Any transcriptional errors that result from this  process are unintentional     Nance Pear, MD 12/20/17 [phone]

## 2017-12-20 NOTE — ED Triage Notes (Addendum)
Pt arrived via POV with reports of stroke last night. Pt states she was yelling at her dog and all of the sudden she got a headache.  Pt states she could not see properly out of her right eye. Pt states sxs started around 1845-1900 yesterday evening.  Pt reports she noted bright colors in front of things.   Pt states sxs disappeared after about 5 minutes. Pt states she continues to have a dull headache.  Pt states right eye is blood shot and c/o right side facial puffiness.  Pt also states her voice has changed as well.  Pt denies any previous hx of stroke and not currently on any blood thinners including aspirin.   Pt denies any slurred speech, pt is currently neurologically intact. No unilateral weakness, equal grip strength bilaterally.  Pt states she has hx of migraines.  Which she states the headache she had last night did not feel like a normal migraine headache.

## 2017-12-20 NOTE — Discharge Instructions (Addendum)
Please seek medical attention for any high fevers, chest pain, shortness of breath, change in behavior, persistent vomiting, bloody stool or any other new or concerning symptoms.  

## 2017-12-20 NOTE — ED Notes (Signed)
Discussed pt with Dr. Reita Cliche, new orders received for labs and CT head non-contrast. Pt agreeable with plan.

## 2017-12-30 ENCOUNTER — Ambulatory Visit (INDEPENDENT_AMBULATORY_CARE_PROVIDER_SITE_OTHER): Payer: Medicare Other | Admitting: Family Medicine

## 2017-12-30 ENCOUNTER — Encounter: Payer: Self-pay | Admitting: Family Medicine

## 2017-12-30 VITALS — BP 132/84 | HR 80 | Temp 98.0°F | Resp 18 | Ht 66.0 in | Wt 236.5 lb

## 2017-12-30 DIAGNOSIS — G43109 Migraine with aura, not intractable, without status migrainosus: Secondary | ICD-10-CM

## 2017-12-30 NOTE — Progress Notes (Signed)
Name: Natalie [name]   MRN: [id number]    DOB: 1950/03/13   Date:12/30/2017       Progress Note  Subjective  Chief Complaint  Chief Complaint  Patient presents with  . Follow-up    ER    HPI  Ocular Migraine: she went to [facility] on 11/17 about 24 hours after an episode of mild temporal headache ( not typical migraine symptoms for her) associated with acute onset of visual changes, described as colorful lights only from the right eye, she did not have any weakness, dysarthria or any other neuro deficit. She states visual changes resolved within 10 minutes but the headache remained for about 24 hours but very mild. She decided to go to [facility] the following day because she was concerned for possible stroke. CT was negative.    Patient Active Problem List   Diagnosis Date Noted  . HPV test positive 07/08/2015  . Elevated blood sugar 06/28/2015  . Difficulty hearing 06/28/2015  . Arthritis, degenerative 06/28/2015  . Obesity, Class I, BMI 30-34.9 06/28/2015  . Paresthesia 06/28/2015  . Purpura, nonthrombopenic ([place]) 06/28/2015  . Skin lesion of right leg 06/28/2015  . Other specified hypothyroidism 08/09/2014  . Migraine without aura and without status migrainosus, not intractable 09/19/2008  . Moderate major depression ([place]) 09/07/2006  . OP (osteoporosis) 09/07/2006    Past Surgical History:  Procedure Laterality Date  . ABLATION  2006    Family History  Problem Relation Age of Onset  . Early death Father        suicide  . Depression Father   . Diabetes Father   . Cancer Father        prostate  . Hearing loss Father        due to war  . Alzheimer's disease Mother   . Heart disease Brother   . Heart disease Maternal Aunt   . Dementia Maternal Aunt   . Heart disease Maternal Uncle   . Dementia Maternal Grandmother   . Heart disease Brother     Social History   Socioeconomic History  . Marital status: Single    Spouse name: Not on file  . Number of children: 1  .  Years of education: Not on file  . Highest education level: Bachelor's degree (e.g., BA, AB, BS)  Occupational History  . Occupation: retired   Scientific laboratory technician  . Financial resource strain: Not hard at all  . Food insecurity:    Worry: Never true    Inability: Never true  . Transportation needs:    Medical: No    Non-medical: No  Tobacco Use  . Smoking status: Former Smoker    Packs/day: 1.00    Years: 6.00    Pack years: 6.00    Types: Cigarettes    Last attempt to quit: 02/11/1974    Years since quitting: 43.9  . Smokeless tobacco: Never Used  Substance and Sexual Activity  . Alcohol use: Yes    Alcohol/week: 0.0 standard drinks    Comment: very seldom; socially  . Drug use: No  . Sexual activity: Not Currently  Lifestyle  . Physical activity:    Days per week: 0 days    Minutes per session: 0 min  . Stress: Not at all  Relationships  . Social connections:    Talks on phone: More than three times a week    Gets together: Once a week    Attends religious service: More than 4 times per year  Active member of club or organization: Yes    Attends meetings of clubs or organizations: More than 4 times per year    Relationship status: Never married  . Intimate partner violence:    Fear of current or ex partner: No    Emotionally abused: No    Physically abused: No    Forced sexual activity: No  Other Topics Concern  . Not on file  Social History Narrative   Raised an adopted child on her own   Working part time reviewed documents     Current Outpatient Medications:  .  buPROPion (WELLBUTRIN SR) 150 MG 12 hr tablet, Take 150 mg by mouth 2 (two) times daily., Disp: , Rfl:  .  citalopram (CELEXA) 40 MG tablet, Take 40 mg by mouth daily., Disp: , Rfl:  .  levothyroxine (SYNTHROID, LEVOTHROID) 75 MCG tablet, Take 1 tablet (75 mcg total) by mouth daily., Disp: 90 tablet, Rfl: 1 .  SUMAtriptan (IMITREX) 100 MG tablet, TAKE 1 TABLET BY MOUTH EVERY 2 HOURS AS NEEDED FOR  MIGRAINE. MAY REPEAT IN 2 HOURS IF HEADACHE PERSISTS OR RECURS., Disp: 10 tablet, Rfl: 0 .  topiramate (TOPAMAX) 25 MG tablet, Take 25 mg by mouth daily. , Disp: , Rfl:  .  Vitamin D, Ergocalciferol, (DRISDOL) 50000 units CAPS capsule, Take 1 capsule (50,000 Units total) by mouth every 7 (seven) days. (Patient not taking: Reported on 12/30/2017), Disp: 12 capsule, Rfl: 1  Allergies  Allergen Reactions  . Sulfamethoxazole-Trimethoprim Hives    I personally reviewed active problem list, medication list, allergies, family history, social history with the patient/caregiver today.   ROS  Ten systems reviewed and is negative except as mentioned in HPI   Objective  Vitals:   12/30/17 1530  BP: 132/84  Pulse: 80  Resp: 18  Temp: 98 F (36.7 C)  TempSrc: Oral  SpO2: 99%  Weight: 236 lb 8 oz (107.3 kg)  Height: _0  (1.676 m)    Body mass index is 38.17 kg/m.  Physical Exam  Constitutional: Patient appears well-developed and well-nourished. Obese  No distress.  HEENT: head atraumatic, normocephalic, pupils equal and reactive to light,  neck supple, throat within normal limits Cardiovascular: Normal rate, regular rhythm and normal heart sounds.  No murmur heard. No BLE edema. Pulmonary/Chest: Effort normal and breath sounds normal. No respiratory distress. Abdominal: Soft.  There is no tenderness. Psychiatric: Patient has a normal mood and affect. behavior is normal. Judgment and thought content normal. Neurological: no focal findings.    Recent Results (from the past 2160 hour(s))  CBC     Status: None   Collection Time: 12/20/17  2:22 PM  Result Value Ref Range   WBC 5.0 4.0 - 10.5 K/uL   RBC 4.44 3.87 - 5.11 MIL/uL   Hemoglobin 13.9 12.0 - 15.0 g/dL   HCT 43.3 36.0 - 46.0 %   MCV 97.5 80.0 - 100.0 fL   MCH 31.3 26.0 - 34.0 pg   MCHC 32.1 30.0 - 36.0 g/dL   RDW 12.2 11.5 - 15.5 %   Platelets 225 150 - 400 K/uL   nRBC 0.0 0.0 - 0.2 %    Comment: Performed at [facility], [address], [place], [place] [address]  Basic metabolic panel     Status: Abnormal   Collection Time: 12/20/17  2:22 PM  Result Value Ref Range   Sodium 142 135 - 145 mmol/L   Potassium 3.7 3.5 - 5.1 mmol/L   Chloride  109 98 - 111 mmol/L   CO2 26 22 - 32 mmol/L   Glucose, Bld 98 70 - 99 mg/dL   BUN 21 8 - 23 mg/dL   Creatinine, Ser 0.72 0.44 - 1.00 mg/dL   Calcium 8.8 (L) 8.9 - 10.3 mg/dL   GFR calc non Af Amer >60 >60 mL/min   GFR calc Af Amer >60 >60 mL/min    Comment: (NOTE) The eGFR has been calculated using the CKD EPI equation. This calculation has not been validated in all clinical situations. eGFR's persistently <60 mL/min signify possible Chronic Kidney Disease.    Anion gap 7 5 - 15    Comment: Performed at [facility], [place], [place] [address]     PHQ2/9: Depression screen [facility] 2/9 12/30/2017 11/24/2017 08/19/2017 04/30/2016 11/02/2015  Decreased Interest 0 1 0 0 0  Down, Depressed, Hopeless 3 0 1 1 0  PHQ - 2 Score _0 0  Altered sleeping 1 1 0 - -  Tired, decreased energy 1 1 0 - -  Change in appetite - 1 1 - -  Feeling bad or failure about yourself  0 0 1 - -  Trouble concentrating 0 0 0 - -  Moving slowly or fidgety/restless 0 0 0 - -  Suicidal thoughts 0 0 0 - -  PHQ-9 Score _1 - -  Difficult doing work/chores Somewhat difficult - - - -     Fall Risk: Fall Risk  12/30/2017 11/24/2017 08/19/2017 05/12/2017 05/12/2017  Falls in the past year? 0 No No No Yes  Number falls in past yr: 0 - - - -  Injury with Fall? - - - - Yes  Comment - - - - -  Risk for fall due to : - - - - History of fall(s)  Risk for fall due to: Comment - - - - -  Follow up - - - - -     Functional Status Survey: Is the patient deaf or have difficulty hearing?: No Does the patient have difficulty seeing, even when wearing glasses/contacts?: Yes Does the patient have difficulty concentrating, remembering, or making decisions?:  No Does the patient have difficulty walking or climbing stairs?: No Does the patient have difficulty dressing or bathing?: No Does the patient have difficulty doing errands alone such as visiting a doctor's office or shopping?: No    Assessment & Plan  1. Ocular migraine  Episode of right temporal headache with visual changes ( flashes of lights) went to [facility] 24 hours later and CT was negative, no episodes since . Gave her reassurance today, reviewed CT

## 2018-02-03 DIAGNOSIS — B349 Viral infection, unspecified: Secondary | ICD-10-CM

## 2018-02-03 HISTORY — DX: Viral infection, unspecified: B34.9

## 2018-03-08 DIAGNOSIS — F330 Major depressive disorder, recurrent, mild: Secondary | ICD-10-CM | POA: Diagnosis not present

## 2018-03-26 ENCOUNTER — Ambulatory Visit: Payer: Medicare Other | Admitting: Family Medicine

## 2018-04-23 ENCOUNTER — Ambulatory Visit (INDEPENDENT_AMBULATORY_CARE_PROVIDER_SITE_OTHER): Payer: Medicare Other | Admitting: Family Medicine

## 2018-04-23 ENCOUNTER — Encounter: Payer: Self-pay | Admitting: Family Medicine

## 2018-04-23 ENCOUNTER — Other Ambulatory Visit: Payer: Self-pay

## 2018-04-23 VITALS — BP 120/80 | HR 90 | Temp 97.9°F | Resp 16 | Ht 66.0 in | Wt 230.3 lb

## 2018-04-23 DIAGNOSIS — E785 Hyperlipidemia, unspecified: Secondary | ICD-10-CM | POA: Diagnosis not present

## 2018-04-23 DIAGNOSIS — G43001 Migraine without aura, not intractable, with status migrainosus: Secondary | ICD-10-CM

## 2018-04-23 DIAGNOSIS — Z79899 Other long term (current) drug therapy: Secondary | ICD-10-CM

## 2018-04-23 DIAGNOSIS — E538 Deficiency of other specified B group vitamins: Secondary | ICD-10-CM | POA: Diagnosis not present

## 2018-04-23 DIAGNOSIS — E038 Other specified hypothyroidism: Secondary | ICD-10-CM

## 2018-04-23 DIAGNOSIS — M810 Age-related osteoporosis without current pathological fracture: Secondary | ICD-10-CM | POA: Diagnosis not present

## 2018-04-23 DIAGNOSIS — E559 Vitamin D deficiency, unspecified: Secondary | ICD-10-CM

## 2018-04-23 DIAGNOSIS — F321 Major depressive disorder, single episode, moderate: Secondary | ICD-10-CM | POA: Diagnosis not present

## 2018-04-23 DIAGNOSIS — D692 Other nonthrombocytopenic purpura: Secondary | ICD-10-CM | POA: Diagnosis not present

## 2018-04-23 MED ORDER — SUMATRIPTAN SUCCINATE 100 MG PO TABS: ORAL_TABLET | ORAL | 0 refills | Status: DC

## 2018-04-23 MED ORDER — VITAMIN D (ERGOCALCIFEROL) 1.25 MG (50000 UNIT) PO CAPS: 50000.0000 [IU] | ORAL_CAPSULE | ORAL | 1 refills | Status: DC

## 2018-04-23 MED ORDER — LEVOTHYROXINE SODIUM 75 MCG PO TABS: 75.0000 ug | ORAL_TABLET | Freq: Every day | ORAL | 1 refills | Status: DC

## 2018-04-23 NOTE — Progress Notes (Signed)
Name: Natalie [name]   MRN: [id number]    DOB: October 27, 1950   Date:04/23/2018       Progress Note  Subjective  Chief Complaint  Chief Complaint  Patient presents with  . Migraine  . Hypothyroidism  . Depression    HPI  Osteoporosis: on right femur, she is fair skin, family history of osteoporosis. She would like to hold off on all medications at this time, she wants to think about it. She has increased calcium in her diet.Continue Vitamin D supplements   B12 deficiency: last B12 was high last visit, previously it was low. She has been taking supplements intermittently  but not SL, we will recheck levels today   Hypothyroidism: she has been taking medication as prescribed, she has noticed that her hair is thinning , she has chronic dry skin, no constipation, no change in bowel movements. TSH has been stable for a long time, we will recheck it today  Dyslipidemia: she does not want to start statin therapy,last LDL was 121, HDL was 40. We will recheck it today  The 10-year ASCVD risk score Mikey Bussing DC Brooke Bonito., et al., 2013) is: 7.1%   Values used to calculate the score:     Age: 68 years     Sex: Female     Is Non-Hispanic African American: No     Diabetic: No     Tobacco smoker: No     Systolic Blood Pressure: 818 mmHg     Is BP treated: No     HDL Cholesterol: 40 mg/dL     Total Cholesterol: 178 mg/dL   OA: takingTylenol prn , off meloxicam and is doing well at this time, right knee more than left . Unchanged   Moderate Depression: still sees Dr. Thurmond Butts, states mood has been stable, compliant with medication,she states never in remission, not seeing therapist, she denies suicidal thoughts or ideation.Pha9 is better today, waking up early but getting 6 hours of sleep. Advised to get up and go for a walk, or read and take nap in the pm if needed.   Migraine headaches: she is on Topamax she states that episodes are on average1in a month, doing better. Pain is described as  right temporal or parietal area, piercing like and at times aching but other times feels like a band on top of her head. She states Imitrex resolves symptoms most of the times ,occasionally has to take a second dose the following morning.    Morbid obesity: BMI above 35 with co-morbidity dyslipidemia and osteoarthritis. Discussed importance of weight loss. She is fasting for lent and has lost 3 lbs since last visit.   Patient Active Problem List   Diagnosis Date Noted  . HPV test positive 07/08/2015  . Elevated blood sugar 06/28/2015  . Difficulty hearing 06/28/2015  . Arthritis, degenerative 06/28/2015  . Obesity, Class I, BMI 30-34.9 06/28/2015  . Paresthesia 06/28/2015  . Purpura, nonthrombopenic ([place]) 06/28/2015  . Skin lesion of right leg 06/28/2015  . Other specified hypothyroidism 08/09/2014  . Migraine without aura and without status migrainosus, not intractable 09/19/2008  . Moderate major depression ([place]) 09/07/2006  . OP (osteoporosis) 09/07/2006    Past Surgical History:  Procedure Laterality Date  . ABLATION  2006    Family History  Problem Relation Age of Onset  . Early death Father        suicide  . Depression Father   . Diabetes Father   . Cancer Father  prostate  . Hearing loss Father        due to war  . Alzheimer's disease Mother   . Heart disease Brother   . Heart disease Maternal Aunt   . Dementia Maternal Aunt   . Heart disease Maternal Uncle   . Dementia Maternal Grandmother   . Heart disease Brother     Social History   Socioeconomic History  . Marital status: Single    Spouse name: Not on file  . Number of children: 1  . Years of education: Not on file  . Highest education level: Bachelor's degree (e.g., BA, AB, BS)  Occupational History  . Occupation: retired   Scientific laboratory technician  . Financial resource strain: Not hard at all  . Food insecurity:    Worry: Never true    Inability: Never true  . Transportation needs:    Medical: No     Non-medical: No  Tobacco Use  . Smoking status: Former Smoker    Packs/day: 1.00    Years: 6.00    Pack years: 6.00    Types: Cigarettes    Last attempt to quit: 02/11/1974    Years since quitting: 44.2  . Smokeless tobacco: Never Used  Substance and Sexual Activity  . Alcohol use: Yes    Alcohol/week: 0.0 standard drinks    Comment: very seldom; socially  . Drug use: No  . Sexual activity: Not Currently  Lifestyle  . Physical activity:    Days per week: 0 days    Minutes per session: 0 min  . Stress: Not at all  Relationships  . Social connections:    Talks on phone: More than three times a week    Gets together: Once a week    Attends religious service: More than 4 times per year    Active member of club or organization: Yes    Attends meetings of clubs or organizations: More than 4 times per year    Relationship status: Never married  . Intimate partner violence:    Fear of current or ex partner: No    Emotionally abused: No    Physically abused: No    Forced sexual activity: No  Other Topics Concern  . Not on file  Social History Narrative   Raised an adopted child on her own   Working part time reviewed documents     Current Outpatient Medications:  .  buPROPion (WELLBUTRIN SR) 150 MG 12 hr tablet, Take 150 mg by mouth 2 (two) times daily., Disp: , Rfl:  .  citalopram (CELEXA) 40 MG tablet, Take 40 mg by mouth daily., Disp: , Rfl:  .  levothyroxine (SYNTHROID, LEVOTHROID) 75 MCG tablet, Take 1 tablet (75 mcg total) by mouth daily., Disp: 90 tablet, Rfl: 1 .  SUMAtriptan (IMITREX) 100 MG tablet, TAKE 1 TABLET BY MOUTH EVERY 2 HOURS AS NEEDED FOR MIGRAINE. MAY REPEAT IN 2 HOURS IF HEADACHE PERSISTS OR RECURS., Disp: 10 tablet, Rfl: 0 .  topiramate (TOPAMAX) 25 MG tablet, Take 25 mg by mouth daily. , Disp: , Rfl:  .  Vitamin D, Ergocalciferol, (DRISDOL) 50000 units CAPS capsule, Take 1 capsule (50,000 Units total) by mouth every 7 (seven) days., Disp: 12 capsule,  Rfl: 1  Allergies  Allergen Reactions  . Sulfamethoxazole-Trimethoprim Hives    I personally reviewed active problem list, medication list, allergies, family history, social history with the patient/caregiver today.   ROS  Constitutional: Negative for fever or weight change.  Respiratory: Negative for cough  and shortness of breath.   Cardiovascular: Negative for chest pain or palpitations.  Gastrointestinal: Negative for abdominal pain, no bowel changes.  Musculoskeletal: Negative for gait problem or joint swelling.  Skin: Negative for rash.  Neurological: Negative for dizziness or headache.  No other specific complaints in a complete review of systems (except as listed in HPI above).  Objective  Vitals:   04/23/18 0817  BP: 120/80  Pulse: 90  Resp: 16  Temp: 97.9 F (36.6 C)  TempSrc: Oral  SpO2: 99%  Weight: 230 lb 4.8 oz (104.5 kg)  Height: 5\' 6"  (1.676 m)    Body mass index is 37.17 kg/m.  Physical Exam  Constitutional: Patient appears well-developed and well-nourished. Obese No distress.  HEENT: head atraumatic, normocephalic, pupils equal and reactive to light, neck supple, throat within normal limits Cardiovascular: Normal rate, regular rhythm and normal heart sounds.  No murmur heard. No BLE edema. Pulmonary/Chest: Effort normal and breath sounds normal. No respiratory distress. Abdominal: Soft.  There is no tenderness. Skin: no bruises at this time, but she states bruises easily  Muscular skeletal: normal gait crepitus with extension of both knees  Psychiatric: Patient has a normal mood and affect. behavior is normal. Judgment and thought content normal.  PHQ2/9: Depression screen [facility] 2/9 04/23/2018 12/30/2017 11/24/2017 08/19/2017 04/30/2016  Decreased Interest 0 0 1 0 0  Down, Depressed, Hopeless 0 3 0 1 1  PHQ - 2 Score 0 3 1 1 1   Altered sleeping 0 1 1 0 -  Tired, decreased energy 1 1 1  0 -  Change in appetite 1 - 1 1 -  Feeling bad or failure about  yourself  0 0 0 1 -  Trouble concentrating 0 0 0 0 -  Moving slowly or fidgety/restless 0 0 0 0 -  Suicidal thoughts 0 0 0 0 -  PHQ-9 Score 2 5 4 3  -  Difficult doing work/chores Not difficult at all Somewhat difficult - - -     Fall Risk: Fall Risk  04/23/2018 12/30/2017 11/24/2017 08/19/2017 05/12/2017  Falls in the past year? - 0 No No No  Number falls in past yr: 0 0 - - -  Injury with Fall? 0 - - - -  Comment - - - - -  Risk for fall due to : - - - - -  Risk for fall due to: Comment - - - - -  Follow up - - - - -    Assessment & Plan  1. Other specified hypothyroidism  - levothyroxine (SYNTHROID, LEVOTHROID) 75 MCG tablet; Take 1 tablet (75 mcg total) by mouth daily.  Dispense: 90 tablet; Refill: 1 - TSH   2. Migraine without aura and with status migrainosus, not intractable  - SUMAtriptan (IMITREX) 100 MG tablet; TAKE 1 TABLET BY MOUTH EVERY 2 HOURS AS NEEDED FOR MIGRAINE. MAY REPEAT IN 2 HOURS IF HEADACHE PERSISTS OR RECURS.  Dispense: 10 tablet; Refill: 0  3. Vitamin D deficiency  - Vitamin D, Ergocalciferol, (DRISDOL) 1.25 MG (50000 UT) CAPS capsule; Take 1 capsule (50,000 Units total) by mouth every 7 (seven) days.  Dispense: 12 capsule; Refill: 1   3. Vitamin D deficiency  - Vitamin D, Ergocalciferol, (DRISDOL) 1.25 MG (50000 UT) CAPS capsule; Take 1 capsule (50,000 Units total) by mouth every 7 (seven) days.  Dispense: 12 capsule; Refill: 1 - VITAMIN D 25 Hydroxy (Vit-D Deficiency, Fractures)  4. Purpura, nonthrombopenic ([place])   5. Moderate major depression ([place])  Keep follow  up with Dr. Thurmond Butts   6. B12 deficiency  - B12 and Folate Panel  7. Dyslipidemia  - Lipid panel  8. Age-related osteoporosis without current pathological fracture  Continue Vitamin D   9. Morbid obesity ([place])  Discussed with the patient the risk posed by an increased BMI. Discussed importance of portion control, calorie counting and at least 150 minutes of physical activity  weekly. Avoid sweet beverages and drink more water. Eat at least 6 servings of fruit and vegetables daily   10. Long-term use of high-risk medication  - COMPLETE METABOLIC PANEL WITH GFR

## 2018-04-24 LAB — COMPLETE METABOLIC PANEL WITH GFR
AG Ratio: 1.6 (calc) (ref 1.0–2.5)
ALT: 12 U/L (ref 6–29)
AST: 17 U/L (ref 10–35)
Albumin: 4.1 g/dL (ref 3.6–5.1)
Alkaline phosphatase (APISO): 95 U/L (ref 37–153)
BUN: 25 mg/dL (ref 7–25)
CO2: 23 mmol/L (ref 20–32)
Calcium: 9 mg/dL (ref 8.6–10.4)
Chloride: 107 mmol/L (ref 98–110)
Creat: 0.92 mg/dL (ref 0.50–0.99)
GFR, Est African American: 74 mL/min/{1.73_m2} (ref >=60)
GFR, Est Non African American: 64 mL/min/{1.73_m2} (ref >=60)
Globulin: 2.6 g/dL (calc) (ref 1.9–3.7)
Glucose, Bld: 96 mg/dL (ref 65–99)
Potassium: 4.1 mmol/L (ref 3.5–5.3)
Sodium: 141 mmol/L (ref 135–146)
Total Bilirubin: 0.4 mg/dL (ref 0.2–1.2)
Total Protein: 6.7 g/dL (ref 6.1–8.1)

## 2018-04-24 LAB — LIPID PANEL
Cholesterol: 160 mg/dL (ref <200)
HDL: 41 mg/dL — ABNORMAL LOW (ref >=50)
LDL Cholesterol (Calc): 102 mg/dL (calc) — ABNORMAL HIGH
Non-HDL Cholesterol (Calc): 119 mg/dL (calc) (ref <130)
Total CHOL/HDL Ratio: 3.9 (calc) (ref <5.0)
Triglycerides: 76 mg/dL (ref <150)

## 2018-04-24 LAB — B12 AND FOLATE PANEL
Folate: >24 ng/mL
Vitamin B-12: 662 pg/mL (ref 200–1100)

## 2018-04-24 LAB — TSH: TSH: 3.18 mIU/L (ref 0.40–4.50)

## 2018-04-24 LAB — VITAMIN D 25 HYDROXY (VIT D DEFICIENCY, FRACTURES): Vit D, 25-Hydroxy: 42 ng/mL (ref 30–100)

## 2018-09-06 DIAGNOSIS — F330 Major depressive disorder, recurrent, mild: Secondary | ICD-10-CM | POA: Diagnosis not present

## 2018-10-22 ENCOUNTER — Other Ambulatory Visit: Payer: Self-pay

## 2018-10-22 ENCOUNTER — Ambulatory Visit (INDEPENDENT_AMBULATORY_CARE_PROVIDER_SITE_OTHER): Payer: Medicare Other | Admitting: Family Medicine

## 2018-10-22 ENCOUNTER — Encounter: Payer: Self-pay | Admitting: Family Medicine

## 2018-10-22 DIAGNOSIS — D692 Other nonthrombocytopenic purpura: Secondary | ICD-10-CM | POA: Diagnosis not present

## 2018-10-22 DIAGNOSIS — E038 Other specified hypothyroidism: Secondary | ICD-10-CM

## 2018-10-22 DIAGNOSIS — G43001 Migraine without aura, not intractable, with status migrainosus: Secondary | ICD-10-CM | POA: Diagnosis not present

## 2018-10-22 DIAGNOSIS — E559 Vitamin D deficiency, unspecified: Secondary | ICD-10-CM

## 2018-10-22 DIAGNOSIS — R739 Hyperglycemia, unspecified: Secondary | ICD-10-CM | POA: Diagnosis not present

## 2018-10-22 DIAGNOSIS — L709 Acne, unspecified: Secondary | ICD-10-CM

## 2018-10-22 DIAGNOSIS — F321 Major depressive disorder, single episode, moderate: Secondary | ICD-10-CM

## 2018-10-22 DIAGNOSIS — M810 Age-related osteoporosis without current pathological fracture: Secondary | ICD-10-CM | POA: Diagnosis not present

## 2018-10-22 DIAGNOSIS — E785 Hyperlipidemia, unspecified: Secondary | ICD-10-CM

## 2018-10-22 MED ORDER — SUMATRIPTAN SUCCINATE 100 MG PO TABS: ORAL_TABLET | ORAL | 0 refills | Status: DC

## 2018-10-22 MED ORDER — VITAMIN D (ERGOCALCIFEROL) 1.25 MG (50000 UNIT) PO CAPS: 50000.0000 [IU] | ORAL_CAPSULE | ORAL | 1 refills | Status: DC

## 2018-10-22 MED ORDER — LEVOTHYROXINE SODIUM 75 MCG PO TABS: 75.0000 ug | ORAL_TABLET | Freq: Every day | ORAL | 1 refills | Status: DC

## 2018-10-22 MED ORDER — CLINDAMYCIN PHOS-BENZOYL PEROX 1-5 % EX GEL: Freq: Two times a day (BID) | CUTANEOUS | 0 refills | Status: DC

## 2018-10-22 NOTE — Progress Notes (Addendum)
Name: Natalie [name]   MRN: [id number]    DOB: 1950-09-06   Date:10/22/2018       Progress Note  Subjective  Chief Complaint  Chief Complaint  Patient presents with  . Migraine  . Depression  . Hypothyroidism  . Bumps    small bumps on the palm of her left hand. Onset was 1 year and they are spreading and gradually getting worse. Lumps are going down the hand between ring finger and pinky. She looked it up and from what she can tell it is Dupuytren's Contraceuture. She is also getting bumps on her face that swell and hurt.     I connected with  [name] on 10/22/18 at  9:20 AM EDT by telephone and verified that I am speaking with the correct person using two identifiers.  I discussed the limitations, risks, security and privacy concerns of performing an evaluation and management service by telephone and the availability of in person appointments. Staff also discussed with the patient that there may be a patient responsible charge related to this service. Patient Location: at home  Provider Location: [facility]   HPI  Osteoporosis: on right femur, she is fair skin, family history of osteoporosis. She would like to hold off on all medications at this time. She has increased calcium in her diet.Continue Vitamin D supplements Unchanged  Skin problems: she has noticed small bumps on the palm of her hands, initially on left hand but now also on right side, worse between 4th and 5 th fingers, no redness, no pruritis or pain, she states she can feel and see it but very small. No associated weakness or stiffness . She is worries about Dupuytren's contracture , explained that we can refer to Ortho but for now she states she just wants to document it   B12 deficiency:last B12 was at goal . She has been taking supplements intermittently but not SL.  Hypothyroidism: she has been taking medication as prescribed, she has noticed that her hair is thinning , she has  intermittent  dry skin, no constipation, no change in bowel movements.TSH last visit was 3.18   Dyslipidemia: she does not want to start statin therapy,last LDL was 102 ,HDL was 41  The 10-year ASCVD risk score Mikey Bussing DC Jr., et al., 2013) is: 6.8%   Values used to calculate the score:     Age: 68 years     Sex: Female     Is Non-Hispanic African American: No     Diabetic: No     Tobacco smoker: No     Systolic Blood Pressure: 123456 mmHg     Is BP treated: No     HDL Cholesterol: 41 mg/dL     Total Cholesterol: 160 mg/dL  OA: takingTylenol prn , off meloxicam and is doing well at this time, right knee more than left. Unchanged   Moderate Depression: still sees Dr. Thurmond Butts, states mood has been stable, compliant with medication,she states never in remission, not seeing therapist, she denies suicidal thoughts or ideation.Phq 9 is 2 today, she states recently had to put her dog down.   Migraine headaches: she is on Topamax she states that episodes are on average1in a month but states lately she has been having a little more .Pain is described as right temporal or parietal area, piercing like and at times aching but other times feels like a band on top of her head. She states Imitrex resolves symptoms most of the  times ,occasionally has to take a second dose the following morning. Stable    Morbid obesity: BMI above 35 with co-morbidity dyslipidemia and osteoarthritis. Discussed importance of weight loss. Discussed regular physical activity as tolerated   Adult Acne: she states on her shin, painful pimples on her chin, one or two spots, explained we can try topical medication.   Patient Active Problem List   Diagnosis Date Noted  . HPV test positive 07/08/2015  . Elevated blood sugar 06/28/2015  . Difficulty hearing 06/28/2015  . Arthritis, degenerative 06/28/2015  . Obesity, Class I, BMI 30-34.9 06/28/2015  . Paresthesia 06/28/2015  . Purpura, nonthrombopenic ([place])  06/28/2015  . Skin lesion of right leg 06/28/2015  . Other specified hypothyroidism 08/09/2014  . Migraine without aura and without status migrainosus, not intractable 09/19/2008  . Moderate major depression ([place]) 09/07/2006  . OP (osteoporosis) 09/07/2006    Past Surgical History:  Procedure Laterality Date  . ABLATION  2006    Family History  Problem Relation Age of Onset  . Early death Father        suicide  . Depression Father   . Diabetes Father   . Cancer Father        prostate  . Hearing loss Father        due to war  . Alzheimer's disease Mother   . Heart disease Brother   . Heart disease Maternal Aunt   . Dementia Maternal Aunt   . Heart disease Maternal Uncle   . Dementia Maternal Grandmother   . Heart disease Brother     Social History   Socioeconomic History  . Marital status: Single    Spouse name: Not on file  . Number of children: 1  . Years of education: Not on file  . Highest education level: Bachelor's degree (e.g., BA, AB, BS)  Occupational History  . Occupation: retired   Scientific laboratory technician  . Financial resource strain: Not hard at all  . Food insecurity    Worry: Never true    Inability: Never true  . Transportation needs    Medical: No    Non-medical: No  Tobacco Use  . Smoking status: Former Smoker    Packs/day: 1.00    Years: 6.00    Pack years: 6.00    Types: Cigarettes    Quit date: 02/11/1974    Years since quitting: 44.7  . Smokeless tobacco: Never Used  Substance and Sexual Activity  . Alcohol use: Yes    Alcohol/week: 0.0 standard drinks    Comment: very seldom; socially  . Drug use: No  . Sexual activity: Not Currently  Lifestyle  . Physical activity    Days per week: 0 days    Minutes per session: 0 min  . Stress: Not at all  Relationships  . Social connections    Talks on phone: More than three times a week    Gets together: Once a week    Attends religious service: More than 4 times per year    Active member of club  or organization: Yes    Attends meetings of clubs or organizations: More than 4 times per year    Relationship status: Never married  . Intimate partner violence    Fear of current or ex partner: No    Emotionally abused: No    Physically abused: No    Forced sexual activity: No  Other Topics Concern  . Not on file  Social History Narrative   Raised  an adopted child on her own   Working part time reviewed documents     Current Outpatient Medications:  .  buPROPion (WELLBUTRIN SR) 150 MG 12 hr tablet, Take 150 mg by mouth 2 (two) times daily., Disp: , Rfl:  .  citalopram (CELEXA) 40 MG tablet, Take 40 mg by mouth daily., Disp: , Rfl:  .  levothyroxine (SYNTHROID, LEVOTHROID) 75 MCG tablet, Take 1 tablet (75 mcg total) by mouth daily., Disp: 90 tablet, Rfl: 1 .  SUMAtriptan (IMITREX) 100 MG tablet, TAKE 1 TABLET BY MOUTH EVERY 2 HOURS AS NEEDED FOR MIGRAINE. MAY REPEAT IN 2 HOURS IF HEADACHE PERSISTS OR RECURS., Disp: 10 tablet, Rfl: 0 .  topiramate (TOPAMAX) 25 MG tablet, Take 25 mg by mouth daily. , Disp: , Rfl:  .  Vitamin D, Ergocalciferol, (DRISDOL) 1.25 MG (50000 UT) CAPS capsule, Take 1 capsule (50,000 Units total) by mouth every 7 (seven) days., Disp: 12 capsule, Rfl: 1  Allergies  Allergen Reactions  . Sulfamethoxazole-Trimethoprim Hives    I personally reviewed active problem list, medication list, allergies, family history, social history, health maintenance with the patient/caregiver today.   ROS  Ten systems reviewed and is negative except as mentioned in HPI   Objective  Virtual encounter, vitals not obtained.  There is no height or weight on file to calculate BMI.  Physical Exam  Awake, alert and oriented  PHQ2/9: Depression screen [facility] 2/9 10/22/2018 04/23/2018 12/30/2017 11/24/2017 08/19/2017  Decreased Interest 1 0 0 1 0  Down, Depressed, Hopeless 1 0 3 0 1  PHQ - 2 Score 2 0 3 1 1   Altered sleeping 0 0 1 1 0  Tired, decreased energy 0 1 1 1  0  Change  in appetite 0 1 - 1 1  Feeling bad or failure about yourself  0 0 0 0 1  Trouble concentrating 0 0 0 0 0  Moving slowly or fidgety/restless 0 0 0 0 0  Suicidal thoughts 0 0 0 0 0  PHQ-9 Score 2 2 5 4 3   Difficult doing work/chores Not difficult at all Not difficult at all Somewhat difficult - -   PHQ-2/9 Result is positive.    Fall Risk: Fall Risk  10/22/2018 04/23/2018 12/30/2017 11/24/2017 08/19/2017  Falls in the past year? 0 - 0 No No  Number falls in past yr: 1 0 0 - -  Injury with Fall? 0 0 - - -  Comment - - - - -  Risk for fall due to : - - - - -  Risk for fall due to: Comment - - - - -  Follow up - - - - -     Assessment & Plan   1. Migraine without aura and with status migrainosus, not intractable  - SUMAtriptan (IMITREX) 100 MG tablet; TAKE 1 TABLET BY MOUTH EVERY 2 HOURS AS NEEDED FOR MIGRAINE. MAY REPEAT IN 2 HOURS IF HEADACHE PERSISTS OR RECURS.  Dispense: 10 tablet; Refill: 0  2. Vitamin D deficiency  - Vitamin D, Ergocalciferol, (DRISDOL) 1.25 MG (50000 UT) CAPS capsule; Take 1 capsule (50,000 Units total) by mouth every 7 (seven) days.  Dispense: 12 capsule; Refill: 1  3. Moderate major depression ([place])  She sees Dr. Thurmond Butts   4. Morbid obesity ([place])  Discussed with the patient the risk posed by an increased BMI. Discussed importance of portion control, calorie counting and at least 150 minutes of physical activity weekly. Avoid sweet beverages and drink more water. Eat at least  6 servings of fruit and vegetables daily   5. Age-related osteoporosis without current pathological fracture  rx vitamin D sent to pharmacy   6. Dyslipidemia  Improved   7. Hyperglycemia   8. Purpura, nonthrombopenic ([place])  Per patient is stable  9. Other specified hypothyroidism  - levothyroxine (SYNTHROID) 75 MCG tablet; Take 1 tablet (75 mcg total) by mouth daily.  Dispense: 90 tablet; Refill: 1  10. Adult acne  - clindamycin-benzoyl peroxide (BENZACLIN) gel; Apply  topically 2 (two) times daily.  Dispense: 50 g; Refill: 0  I discussed the assessment and treatment plan with the patient. The patient was provided an opportunity to ask questions and all were answered. The patient agreed with the plan and demonstrated an understanding of the instructions.   The patient was advised to call back or seek an in-person evaluation if the symptoms worsen or if the condition fails to improve as anticipated.  I provided 25  minutes of non-face-to-face time during this encounter.  Loistine Chance, MD

## 2018-10-22 NOTE — Addendum Note (Signed)
Addended by: [name] F on: 10/22/2018 10:21 AM   Modules accepted: Orders

## 2018-11-16 ENCOUNTER — Ambulatory Visit: Payer: Medicare Other

## 2018-11-16 ENCOUNTER — Ambulatory Visit (INDEPENDENT_AMBULATORY_CARE_PROVIDER_SITE_OTHER): Payer: Medicare Other

## 2018-11-16 DIAGNOSIS — Z23 Encounter for immunization: Secondary | ICD-10-CM | POA: Diagnosis not present

## 2018-11-18 ENCOUNTER — Ambulatory Visit (INDEPENDENT_AMBULATORY_CARE_PROVIDER_SITE_OTHER): Payer: Medicare Other

## 2018-11-18 ENCOUNTER — Other Ambulatory Visit: Payer: Self-pay

## 2018-11-18 ENCOUNTER — Telehealth: Payer: Self-pay

## 2018-11-18 VITALS — Ht 66.0 in | Wt 235.0 lb

## 2018-11-18 DIAGNOSIS — M810 Age-related osteoporosis without current pathological fracture: Secondary | ICD-10-CM

## 2018-11-18 DIAGNOSIS — Z1211 Encounter for screening for malignant neoplasm of colon: Secondary | ICD-10-CM

## 2018-11-18 DIAGNOSIS — Z0000 Encounter for general adult medical examination without abnormal findings: Secondary | ICD-10-CM | POA: Diagnosis not present

## 2018-11-18 DIAGNOSIS — Z1231 Encounter for screening mammogram for malignant neoplasm of breast: Secondary | ICD-10-CM

## 2018-11-18 MED ORDER — NA SULFATE-K SULFATE-MG SULF 17.5-3.13-1.6 GM/177ML PO SOLN: 1.0000 | Freq: Once | ORAL | 0 refills | Status: AC

## 2018-11-18 NOTE — Telephone Encounter (Signed)
Gastroenterology Pre-Procedure Review  Request Date: Monday 11/29/18  Requesting Physician: Dr. Bonna Gains  PATIENT REVIEW QUESTIONS: The patient responded to the following health history questions as indicated:    1. Are you having any GI issues? no 2. Do you have a personal history of Polyps? no 3. Do you have a family history of Colon Cancer or Polyps? no 4. Diabetes Mellitus? no 5. Joint replacements in the past 12 months?no 6. Major health problems in the past 3 months?no 7. Any artificial heart valves, MVP, or defibrillator?no    MEDICATIONS & ALLERGIES:    Patient reports the following regarding taking any anticoagulation/antiplatelet therapy:   Plavix, Coumadin, Eliquis, Xarelto, Lovenox, Pradaxa, Brilinta, or Effient? no Aspirin? no  Patient confirms/reports the following medications:  Current Outpatient Medications  Medication Sig Dispense Refill  . buPROPion (WELLBUTRIN SR) 150 MG 12 hr tablet Take 150 mg by mouth 2 (two) times daily.    . citalopram (CELEXA) 40 MG tablet Take 40 mg by mouth daily.    . clindamycin-benzoyl peroxide (BENZACLIN) gel Apply topically 2 (two) times daily. 50 g 0  . levothyroxine (SYNTHROID) 75 MCG tablet Take 1 tablet (75 mcg total) by mouth daily. 90 tablet 1  . Multiple Vitamins-Minerals (MULTIVITAMIN WOMEN 50+ PO) Take 1 tablet by mouth daily.    . Omega-3 Fatty Acids (FISH OIL) 1000 MG CAPS Take 1 capsule by mouth daily.    . SUMAtriptan (IMITREX) 100 MG tablet TAKE 1 TABLET BY MOUTH EVERY 2 HOURS AS NEEDED FOR MIGRAINE. MAY REPEAT IN 2 HOURS IF HEADACHE PERSISTS OR RECURS. 10 tablet 0  . topiramate (TOPAMAX) 25 MG tablet Take 25 mg by mouth daily.     . Vitamin D, Ergocalciferol, (DRISDOL) 1.25 MG (50000 UT) CAPS capsule Take 1 capsule (50,000 Units total) by mouth every 7 (seven) days. 12 capsule 1   No current facility-administered medications for this visit.     Patient confirms/reports the following allergies:  Allergies  Allergen  Reactions  . Sulfamethoxazole-Trimethoprim Hives    No orders of the defined types were placed in this encounter.   AUTHORIZATION INFORMATION Primary Insurance: 1D#: Group #:  Secondary Insurance: 1D#: Group #:  SCHEDULE INFORMATION: Date: Monday 11/29/18 Time: Location:

## 2018-11-18 NOTE — Progress Notes (Signed)
Subjective:   Natalie [name] is a 68 y.o. female who presents for Medicare Annual (Subsequent) preventive examination.  Virtual Visit via Telephone Note  I connected with [name] on 11/18/18 at 10:40 AM EDT by telephone and verified that I am speaking with the correct person using two identifiers.  Medicare Annual Wellness visit completed telephonically due to Covid-19 pandemic.   Location: Patient: home Provider: office   I discussed the limitations, risks, security and privacy concerns of performing an evaluation and management service by telephone and the availability of in person appointments. The patient expressed understanding and agreed to proceed.  Some vital signs may be absent or patient reported.   Clemetine Marker, LPN    Review of Systems:   Cardiac Risk Factors include: advanced age (>67men, >95 women);obesity (BMI >30kg/m2)     Objective:     Vitals: Ht 5\' 6"  (1.676 m)   Wt 235 lb (106.6 kg)   BMI 37.93 kg/m   Body mass index is 37.93 kg/m.  Advanced Directives 11/18/2018 12/20/2017 08/03/2016 04/30/2016 11/02/2015 08/03/2015 06/28/2015  Does Patient Have a Medical Advance Directive? Yes Yes Yes Yes Yes Yes Yes  Type of Paramedic of [place];Living will [place];Living will [place];Living will [place];Living will [place];Living will Living will Living will  Does patient want to make changes to medical advance directive? Yes (MAU/Ambulatory/Procedural Areas - Information given) - - - No - Patient declined - -  Copy of [place] in Chart? - No - copy requested No - copy requested - No - copy requested - -  Would patient like information on creating a medical advance directive? - No - Patient declined No - Patient declined - - - -    Tobacco Social History   Tobacco Use  Smoking Status Former Smoker  . Packs/day: 1.00  .  Years: 6.00  . Pack years: 6.00  . Types: Cigarettes  . Quit date: 02/11/1974  . Years since quitting: 44.7  Smokeless Tobacco Never Used     Counseling given: Not Answered   Clinical Intake:  Pre-visit preparation completed: Yes  Pain : 0-10 Pain Score: 3  Pain Type: Chronic pain Pain Location: Neck Pain Orientation: Posterior Pain Descriptors / Indicators: Aching Pain Onset: More than a month ago Pain Frequency: Intermittent     BMI - recorded: 37.93 Nutritional Status: BMI > 30  Obese Nutritional Risks: None Diabetes: No  How often do you need to have someone help you when you read instructions, pamphlets, or other written materials from your doctor or pharmacy?: 1 - Never  Interpreter Needed?: No  Information entered by :: Clemetine Marker LPN  Past Medical History:  Diagnosis Date  . Acute bronchospasm due to viral infection   . Anemia    history of  . Attention deficit disorder with hyperactivity   . Blood pressure elevated without history of HTN   . Depressive disorder   . Epistaxis   . Fatigue   . Hearing loss   . Hypothyroidism   . Memory change   . Migraine   . Numbness and tingling   . Obesity (BMI 30.0-34.9)   . Osteoarthritis   . Osteoporosis   . Polyuria   . Premature menopause   . Seizures ([place])    history of mini seizures, possible migraine induced   Past Surgical History:  Procedure Laterality Date  . ABLATION  2006  Family History  Problem Relation Age of Onset  . Early death Father        suicide  . Depression Father   . Diabetes Father   . Cancer Father        prostate  . Hearing loss Father        due to war  . Alzheimer's disease Mother   . Heart disease Brother   . Heart disease Maternal Aunt   . Dementia Maternal Aunt   . Heart disease Maternal Uncle   . Dementia Maternal Grandmother   . Heart disease Brother    Social History   Socioeconomic History  . Marital status: Single    Spouse name: Not on file  . Number  of children: 1  . Years of education: Not on file  . Highest education level: Bachelor's degree (e.g., BA, AB, BS)  Occupational History  . Occupation: retired   Scientific laboratory technician  . Financial resource strain: Not hard at all  . Food insecurity    Worry: Never true    Inability: Never true  . Transportation needs    Medical: No    Non-medical: No  Tobacco Use  . Smoking status: Former Smoker    Packs/day: 1.00    Years: 6.00    Pack years: 6.00    Types: Cigarettes    Quit date: 02/11/1974    Years since quitting: 44.7  . Smokeless tobacco: Never Used  Substance and Sexual Activity  . Alcohol use: Yes    Alcohol/week: 0.0 standard drinks    Comment: very seldom; socially  . Drug use: No  . Sexual activity: Not Currently  Lifestyle  . Physical activity    Days per week: 0 days    Minutes per session: 0 min  . Stress: Not at all  Relationships  . Social connections    Talks on phone: More than three times a week    Gets together: Once a week    Attends religious service: More than 4 times per year    Active member of club or organization: Yes    Attends meetings of clubs or organizations: More than 4 times per year    Relationship status: Never married  Other Topics Concern  . Not on file  Social History Narrative   Raised an adopted child on her own   Working part time reviewed documents    Outpatient Encounter Medications as of 11/18/2018  Medication Sig  . buPROPion (WELLBUTRIN SR) 150 MG 12 hr tablet Take 150 mg by mouth 2 (two) times daily.  . citalopram (CELEXA) 40 MG tablet Take 40 mg by mouth daily.  . clindamycin-benzoyl peroxide (BENZACLIN) gel Apply topically 2 (two) times daily.  Marland Kitchen levothyroxine (SYNTHROID) 75 MCG tablet Take 1 tablet (75 mcg total) by mouth daily.  . Multiple Vitamins-Minerals (MULTIVITAMIN WOMEN 50+ PO) Take 1 tablet by mouth daily.  . Omega-3 Fatty Acids (FISH OIL) 1000 MG CAPS Take 1 capsule by mouth daily.  . SUMAtriptan (IMITREX) 100  MG tablet TAKE 1 TABLET BY MOUTH EVERY 2 HOURS AS NEEDED FOR MIGRAINE. MAY REPEAT IN 2 HOURS IF HEADACHE PERSISTS OR RECURS.  Marland Kitchen topiramate (TOPAMAX) 25 MG tablet Take 25 mg by mouth daily.   . Vitamin D, Ergocalciferol, (DRISDOL) 1.25 MG (50000 UT) CAPS capsule Take 1 capsule (50,000 Units total) by mouth every 7 (seven) days.   No facility-administered encounter medications on file as of 11/18/2018.     Activities of Daily Living In  your present state of health, do you have any difficulty performing the following activities: 11/18/2018 10/22/2018  Hearing? Y N  Comment pt declines hearing aids -  Vision? N N  Difficulty concentrating or making decisions? Y N  Walking or climbing stairs? N N  Dressing or bathing? N N  Doing errands, shopping? N N  Preparing Food and eating ? N -  Using the Toilet? N -  In the past six months, have you accidently leaked urine? Y -  Do you have problems with loss of bowel control? N -  Managing your Medications? N -  Managing your Finances? N -  Housekeeping or managing your Housekeeping? N -  Some recent data might be hidden    Patient Care Team: [name], MD as PCP - General (Family Medicine) Lew Dawes, MD as Attending Physician (Psychiatry)    Assessment:   This is a routine wellness examination for [place].  Exercise Activities and Dietary recommendations Current Exercise Habits: The patient does not participate in regular exercise at present, Exercise limited by: None identified  Goals    . DIET - EAT MORE FRUITS AND VEGETABLES     Reduce sugar and ice cream and increase intake of veggies and greens        Fall Risk Fall Risk  11/18/2018 10/22/2018 04/23/2018 12/30/2017 11/24/2017  Falls in the past year? 1 0 - 0 No  Number falls in past yr: 1 1 0 0 -  Injury with Fall? 0 0 0 - -  Comment - - - - -  Risk for fall due to : Impaired balance/gait - - - -  Risk for fall due to: Comment - - - - -  Follow up Falls  prevention discussed - - - -   FALL RISK PREVENTION PERTAINING TO THE HOME:  Any stairs in or around the home? Yes  If so, do they handrails? Yes   Home free of loose throw rugs in walkways, pet beds, electrical cords, etc? Yes  Adequate lighting in your home to reduce risk of falls? Yes   ASSISTIVE DEVICES UTILIZED TO PREVENT FALLS:  Life alert? No  Use of a cane, walker or w/c? No  Grab bars in the bathroom? No  Shower chair or bench in shower? No  Elevated toilet seat or a handicapped toilet? No   DME ORDERS:  DME order needed?  No   TIMED UP AND GO:  Was the test performed? No . Telephonic visit.   Education: Fall risk prevention has been discussed.  Intervention(s) required? No    Depression Screen PHQ 2/9 Scores 11/18/2018 10/22/2018 04/23/2018 12/30/2017  PHQ - 2 Score 4 2 0 3  PHQ- 9 Score 17 2 2 5   Exception Documentation - - - -  Not completed - - - -     Cognitive Function     6CIT Screen 11/18/2018  What Year? 0 points  What month? 0 points  What time? 0 points  Count back from 20 0 points  Months in reverse 0 points  Repeat phrase 0 points  Total Score 0    Immunization History  Administered Date(s) Administered  . Fluad Quad(high Dose 65+) 11/16/2018  . Influenza, High Dose Seasonal PF 11/02/2015, 11/03/2016, 11/24/2017  . Influenza,inj,Quad PF,6+ Mos 10/26/2014  . Pneumococcal Conjugate-13 11/03/2016  . Pneumococcal Polysaccharide-23 06/28/2015  . Tdap 12/20/2012    Qualifies for Shingles Vaccine? Yes . Due for Shingrix. Education has been provided regarding the importance of  this vaccine. Pt has been advised to call insurance company to determine out of pocket expense. Advised may also receive vaccine at local pharmacy or Health Dept. Verbalized acceptance and understanding.  Tdap: Up to date  Flu Vaccine: Up to date  Pneumococcal Vaccine: Up to date   Screening Tests Health Maintenance  Topic Date Due  . MAMMOGRAM  07/23/2017  .  COLONOSCOPY  02/03/2018  . PAP SMEAR-Modifier  08/20/2018  . TETANUS/TDAP  12/21/2022  . INFLUENZA VACCINE  Completed  . DEXA SCAN  Completed  . Hepatitis C Screening  Completed  . PNA vac Low Risk Adult  Completed    Cancer Screenings:  Colorectal Screening: Completed 2010. Repeat every 10 years. Referral to GI placed today. Pt aware the office will call re: appt.  Mammogram: Completed 07/24/15. Repeat every year. Ordered today. Pt provided with contact information and advised to call to schedule appt.   Bone Density: Completed 07/24/15. Results reflect OSTEOPOROSIS. Repeat every 2 years. Ordered today. Pt provided with contact information and advised to call to schedule appt.   Lung Cancer Screening: (Low Dose CT Chest recommended if Age 49-80 years, 30 pack-year currently smoking OR have quit w/in 15years.) does not qualify.   Additional Screening:  Hepatitis C Screening: does qualify; Completed 06/28/15  Vision Screening: Recommended annual ophthalmology exams for early detection of glaucoma and other disorders of the eye. Is the patient up to date with their annual eye exam?  Yes  Who is the provider or what is the name of the office in which the pt attends annual eye exams? [place] Screening: Recommended annual dental exams for proper oral hygiene  Community Resource Referral:  CRR required this visit?  No       Plan:     I have personally reviewed and addressed the Medicare Annual Wellness questionnaire and have noted the following in the patient's chart:  A. Medical and social history B. Use of alcohol, tobacco or illicit drugs  C. Current medications and supplements D. Functional ability and status E.  Nutritional status F.  Physical activity G. Advance directives H. List of other physicians I.  Hospitalizations, surgeries, and ER visits in previous 12 months J.  [place] such as hearing and vision if needed, cognitive and depression  L. Referrals and appointments   In addition, I have reviewed and discussed with patient certain preventive protocols, quality metrics, and best practice recommendations. A written personalized care plan for preventive services as well as general preventive health recommendations were provided to patient.   Signed,  Clemetine Marker, LPN Nurse Health Advisor   Nurse Notes: none

## 2018-11-18 NOTE — Patient Instructions (Signed)
Natalie [name] , Thank you for taking time to come for your Medicare Wellness Visit. I appreciate your ongoing commitment to your health goals. Please review the following plan we discussed and let me know if I can assist you in the future.   Screening recommendations/referrals: Colonoscopy: done 2010. Referral sent to [facility] for repeat screening colonoscopy.  Mammogram: done 07/24/15. Please call [phone] to schedule your mammogram and bone density screening.  Bone Density: done 07/24/15 Recommended yearly ophthalmology/optometry visit for glaucoma screening and checkup Recommended yearly dental visit for hygiene and checkup  Vaccinations: Influenza vaccine: done 11/16/18 Pneumococcal vaccine: done 11/03/16 Tdap vaccine: done 12/20/12 Shingles vaccine: Shingrix discussed. Please contact your pharmacy for coverage information.   Advanced directives: Advance directive discussed with you today. I have provided a copy for you to complete at home and have notarized. Once this is complete please bring a copy in to our office so we can scan it into your chart.  Conditions/risks identified: Recommend continue healthy eating and reduce intake of sugar and ice cream and increase fruits and vegetables.   Next appointment: Please follow up in one year for your Medicare Annual Wellness visit.     Preventive Care 68 Years and Older, Female Preventive care refers to lifestyle choices and visits with your health care provider that can promote health and wellness. What does preventive care include?  A yearly physical exam. This is also called an annual well check.  Dental exams once or twice a year.  Routine eye exams. Ask your health care provider how often you should have your eyes checked.  Personal lifestyle choices, including:  Daily care of your teeth and gums.  Regular physical activity.  Eating a healthy diet.  Avoiding tobacco and drug use.  Limiting alcohol use.  Practicing  safe sex.  Taking low-dose aspirin every day.  Taking vitamin and mineral supplements as recommended by your health care provider. What happens during an annual well check? The services and screenings done by your health care provider during your annual well check will depend on your age, overall health, lifestyle risk factors, and family history of disease. Counseling  Your health care provider may ask you questions about your:  Alcohol use.  Tobacco use.  Drug use.  Emotional well-being.  Home and relationship well-being.  Sexual activity.  Eating habits.  History of falls.  Memory and ability to understand (cognition).  Work and work Statistician.  Reproductive health. Screening  You may have the following tests or measurements:  Height, weight, and BMI.  Blood pressure.  Lipid and cholesterol levels. These may be checked every 5 years, or more frequently if you are over 53 years old.  Skin check.  Lung cancer screening. You may have this screening every year starting at age 68 if you have a 30-pack-year history of smoking and currently smoke or have quit within the past 15 years.  Fecal occult blood test (FOBT) of the stool. You may have this test every year starting at age 68.  Flexible sigmoidoscopy or colonoscopy. You may have a sigmoidoscopy every 5 years or a colonoscopy every 10 years starting at age 68.  Hepatitis C blood test.  Hepatitis B blood test.  Sexually transmitted disease (STD) testing.  Diabetes screening. This is done by checking your blood sugar (glucose) after you have not eaten for a while (fasting). You may have this done every 1-3 years.  Bone density scan. This is done to screen for osteoporosis. You may have  this done starting at age 68.  Mammogram. This may be done every 1-2 years. Talk to your health care provider about how often you should have regular mammograms. Talk with your health care provider about your test results,  treatment options, and if necessary, the need for more tests. Vaccines  Your health care provider may recommend certain vaccines, such as:  Influenza vaccine. This is recommended every year.  Tetanus, diphtheria, and acellular pertussis (Tdap, Td) vaccine. You may need a Td booster every 10 years.  Zoster vaccine. You may need this after age 21.  Pneumococcal 13-valent conjugate (PCV13) vaccine. One dose is recommended after age 68.  Pneumococcal polysaccharide (PPSV23) vaccine. One dose is recommended after age 68. Talk to your health care provider about which screenings and vaccines you need and how often you need them. This information is not intended to replace advice given to you by your health care provider. Make sure you discuss any questions you have with your health care provider. Document Released: 02/16/2015 Document Revised: 10/10/2015 Document Reviewed: 11/21/2014 Elsevier Interactive Patient Education  2017 [place] Prevention in the Home Falls can cause injuries. They can happen to people of all ages. There are many things you can do to make your home safe and to help prevent falls. What can I do on the outside of my home?  Regularly fix the edges of walkways and driveways and fix any cracks.  Remove anything that might make you trip as you walk through a door, such as a raised step or threshold.  Trim any bushes or trees on the path to your home.  Use bright outdoor lighting.  Clear any walking paths of anything that might make someone trip, such as rocks or tools.  Regularly check to see if handrails are loose or broken. Make sure that both sides of any steps have handrails.  Any raised decks and porches should have guardrails on the edges.  Have any leaves, snow, or ice cleared regularly.  Use sand or salt on walking paths during winter.  Clean up any spills in your garage right away. This includes oil or grease spills. What can I do in the  bathroom?  Use night lights.  Install grab bars by the toilet and in the tub and shower. Do not use towel bars as grab bars.  Use non-skid mats or decals in the tub or shower.  If you need to sit down in the shower, use a plastic, non-slip stool.  Keep the floor dry. Clean up any water that spills on the floor as soon as it happens.  Remove soap buildup in the tub or shower regularly.  Attach bath mats securely with double-sided non-slip rug tape.  Do not have throw rugs and other things on the floor that can make you trip. What can I do in the bedroom?  Use night lights.  Make sure that you have a light by your bed that is easy to reach.  Do not use any sheets or blankets that are too big for your bed. They should not hang down onto the floor.  Have a firm chair that has side arms. You can use this for support while you get dressed.  Do not have throw rugs and other things on the floor that can make you trip. What can I do in the kitchen?  Clean up any spills right away.  Avoid walking on wet floors.  Keep items that you use a lot in easy-to-reach  places.  If you need to reach something above you, use a strong step stool that has a grab bar.  Keep electrical cords out of the way.  Do not use floor polish or wax that makes floors slippery. If you must use wax, use non-skid floor wax.  Do not have throw rugs and other things on the floor that can make you trip. What can I do with my stairs?  Do not leave any items on the stairs.  Make sure that there are handrails on both sides of the stairs and use them. Fix handrails that are broken or loose. Make sure that handrails are as long as the stairways.  Check any carpeting to make sure that it is firmly attached to the stairs. Fix any carpet that is loose or worn.  Avoid having throw rugs at the top or bottom of the stairs. If you do have throw rugs, attach them to the floor with carpet tape.  Make sure that you have a  light switch at the top of the stairs and the bottom of the stairs. If you do not have them, ask someone to add them for you. What else can I do to help prevent falls?  Wear shoes that:  Do not have high heels.  Have rubber bottoms.  Are comfortable and fit you well.  Are closed at the toe. Do not wear sandals.  If you use a stepladder:  Make sure that it is fully opened. Do not climb a closed stepladder.  Make sure that both sides of the stepladder are locked into place.  Ask someone to hold it for you, if possible.  Clearly mark and make sure that you can see:  Any grab bars or handrails.  First and last steps.  Where the edge of each step is.  Use tools that help you move around (mobility aids) if they are needed. These include:  Canes.  Walkers.  Scooters.  Crutches.  Turn on the lights when you go into a dark area. Replace any light bulbs as soon as they burn out.  Set up your furniture so you have a clear path. Avoid moving your furniture around.  If any of your floors are uneven, fix them.  If there are any pets around you, be aware of where they are.  Review your medicines with your doctor. Some medicines can make you feel dizzy. This can increase your chance of falling. Ask your doctor what other things that you can do to help prevent falls. This information is not intended to replace advice given to you by your health care provider. Make sure you discuss any questions you have with your health care provider. Document Released: 11/16/2008 Document Revised: 06/28/2015 Document Reviewed: 02/24/2014 Elsevier Interactive Patient Education  2017 [employer].

## 2018-11-19 ENCOUNTER — Telehealth: Payer: Self-pay

## 2018-11-19 MED ORDER — NA SULFATE-K SULFATE-MG SULF 17.5-3.13-1.6 GM/177ML PO SOLN: 1.0000 | Freq: Once | ORAL | 0 refills | Status: AC

## 2018-11-19 NOTE — Telephone Encounter (Signed)
Returned patients call in regards to rescheduling her colonoscopy.  Her colonoscopy has been rescheduled to Wed 12/01/18 [place] test date October 23rd.  Endo notified of new date.  Thanks [employer]

## 2018-11-26 ENCOUNTER — Other Ambulatory Visit: Admission: RE | Admit: 2018-11-26 | Payer: Medicare Other | Source: Ambulatory Visit

## 2018-11-26 ENCOUNTER — Telehealth: Payer: Self-pay

## 2018-11-26 NOTE — Telephone Encounter (Signed)
Called patient to remind patient that her COVID test was today. Patient states she wants to rescheduled the procedure for 12/01/2018. Patient states she wants this on 12/22/2018. Informed patient that procedure would be 11/18 and COVID test 11/13

## 2018-11-29 ENCOUNTER — Telehealth: Payer: Self-pay | Admitting: Family Medicine

## 2018-11-29 NOTE — Chronic Care Management (AMB) (Signed)
Chronic Care Management   Note  11/29/2018 Name: Natalie [name] MRN: [id number] DOB: [birth year]  KARRIS DEANGELO is a 68 y.o. year old female who is a primary care patient of [name], MD. I reached out to [name] by phone today in response to a referral sent by Ms. [employer]'s health plan.     Ms. Elzey was given information about Chronic Care Management services today including:  1. CCM service includes personalized support from designated clinical staff supervised by her physician, including individualized plan of care and coordination with other care providers 2. 24/7 contact phone numbers for assistance for urgent and routine care needs. 3. Service will only be billed when office clinical staff spend 20 minutes or more in a month to coordinate care. 4. Only one practitioner may furnish and bill the service in a calendar month. 5. The patient may stop CCM services at any time (effective at the end of the month) by phone call to the office staff. 6. The patient will be responsible for cost sharing (co-pay) of up to 20% of the service fee (after annual deductible is met).  Patient agreed to services and verbal consent obtained.   Follow up plan: Telephone appointment with CCM team member scheduled for:12/20/1998  [place]  ??[email or web address]   ??[phone]

## 2018-12-15 ENCOUNTER — Other Ambulatory Visit: Payer: Self-pay

## 2018-12-15 MED ORDER — NA SULFATE-K SULFATE-MG SULF 17.5-3.13-1.6 GM/177ML PO SOLN: 1.0000 | Freq: Once | ORAL | 0 refills | Status: AC

## 2018-12-16 ENCOUNTER — Telehealth: Payer: Self-pay

## 2018-12-16 NOTE — Telephone Encounter (Signed)
Called patient to confirm patient procedure appointment and to remind patient that they have to go for a COVID test on 12/17/2018.  Patient verbalized understanding   

## 2018-12-17 ENCOUNTER — Other Ambulatory Visit
Admission: RE | Admit: 2018-12-17 | Discharge: 2018-12-17 | Disposition: A | Discharge status: Home / Self Care | Payer: Medicare Other | Source: Ambulatory Visit | Attending: Gastroenterology | Admitting: Gastroenterology

## 2018-12-17 ENCOUNTER — Other Ambulatory Visit: Payer: Self-pay

## 2018-12-17 DIAGNOSIS — Z20828 Contact with and (suspected) exposure to other viral communicable diseases: Secondary | ICD-10-CM | POA: Insufficient documentation

## 2018-12-17 DIAGNOSIS — Z01812 Encounter for preprocedural laboratory examination: Secondary | ICD-10-CM | POA: Diagnosis not present

## 2018-12-18 LAB — SARS CORONAVIRUS 2 (TAT 6-24 HRS): SARS Coronavirus 2: NEGATIVE

## 2018-12-20 ENCOUNTER — Ambulatory Visit: Payer: Medicare Other

## 2018-12-21 NOTE — Chronic Care Management (AMB) (Signed)
  Chronic Care Management   Note   Name: Natalie [name] MRN: [id number] DOB: 26-Jul-1950   Brief outreach with Natalie [name]. The initial intake was not performed today however she is still  interested in receiving care management services. Reports preparing for a scheduled procedure this week. She is agreeable to follow-up outreach to complete the initial intake assessment.    Follow up plan: Natalie [name] was provided with contact information for the Nurse Case Manager and encouraged to call if needed prior to the scheduled outreach.     [facility] [phone]

## 2018-12-22 ENCOUNTER — Encounter
Admission: RE | Disposition: A | Discharge status: Home / Self Care | Payer: Self-pay | Source: Home / Self Care | Attending: Gastroenterology

## 2018-12-22 ENCOUNTER — Encounter: Payer: Self-pay | Admitting: *Deleted

## 2018-12-22 ENCOUNTER — Ambulatory Visit: Payer: Medicare Other | Admitting: Certified Registered"

## 2018-12-22 ENCOUNTER — Ambulatory Visit
Admission: RE | Admit: 2018-12-22 | Discharge: 2018-12-22 | Disposition: A | Discharge status: Home / Self Care | Payer: Medicare Other | Attending: Gastroenterology | Admitting: Gastroenterology

## 2018-12-22 ENCOUNTER — Other Ambulatory Visit: Payer: Self-pay

## 2018-12-22 DIAGNOSIS — F329 Major depressive disorder, single episode, unspecified: Secondary | ICD-10-CM | POA: Diagnosis not present

## 2018-12-22 DIAGNOSIS — Z1211 Encounter for screening for malignant neoplasm of colon: Secondary | ICD-10-CM

## 2018-12-22 DIAGNOSIS — K635 Polyp of colon: Secondary | ICD-10-CM

## 2018-12-22 DIAGNOSIS — K5730 Diverticulosis of large intestine without perforation or abscess without bleeding: Secondary | ICD-10-CM | POA: Diagnosis not present

## 2018-12-22 DIAGNOSIS — E039 Hypothyroidism, unspecified: Secondary | ICD-10-CM | POA: Diagnosis not present

## 2018-12-22 DIAGNOSIS — D122 Benign neoplasm of ascending colon: Secondary | ICD-10-CM | POA: Insufficient documentation

## 2018-12-22 DIAGNOSIS — E669 Obesity, unspecified: Secondary | ICD-10-CM | POA: Diagnosis not present

## 2018-12-22 DIAGNOSIS — M810 Age-related osteoporosis without current pathological fracture: Secondary | ICD-10-CM | POA: Diagnosis not present

## 2018-12-22 DIAGNOSIS — Z87891 Personal history of nicotine dependence: Secondary | ICD-10-CM | POA: Diagnosis not present

## 2018-12-22 DIAGNOSIS — K5790 Diverticulosis of intestine, part unspecified, without perforation or abscess without bleeding: Secondary | ICD-10-CM | POA: Diagnosis not present

## 2018-12-22 DIAGNOSIS — Z79890 Hormone replacement therapy (postmenopausal): Secondary | ICD-10-CM | POA: Insufficient documentation

## 2018-12-22 DIAGNOSIS — Z79899 Other long term (current) drug therapy: Secondary | ICD-10-CM | POA: Diagnosis not present

## 2018-12-22 DIAGNOSIS — D126 Benign neoplasm of colon, unspecified: Secondary | ICD-10-CM | POA: Diagnosis not present

## 2018-12-22 DIAGNOSIS — Z6837 Body mass index (BMI) 37.0-37.9, adult: Secondary | ICD-10-CM | POA: Diagnosis not present

## 2018-12-22 DIAGNOSIS — M1990 Unspecified osteoarthritis, unspecified site: Secondary | ICD-10-CM | POA: Insufficient documentation

## 2018-12-22 DIAGNOSIS — Z8249 Family history of ischemic heart disease and other diseases of the circulatory system: Secondary | ICD-10-CM | POA: Insufficient documentation

## 2018-12-22 DIAGNOSIS — D124 Benign neoplasm of descending colon: Secondary | ICD-10-CM | POA: Diagnosis not present

## 2018-12-22 HISTORY — PX: COLONOSCOPY WITH PROPOFOL: SHX5780

## 2018-12-22 SURGERY — COLONOSCOPY WITH PROPOFOL
Anesthesia: General

## 2018-12-22 MED ORDER — PROPOFOL 500 MG/50ML IV EMUL
INTRAVENOUS | Status: DC | PRN
  Administered 2018-12-22: 100 ug/kg/min via INTRAVENOUS

## 2018-12-22 MED ORDER — PROPOFOL 10 MG/ML IV BOLUS
INTRAVENOUS | Status: DC | PRN
  Administered 2018-12-22: 40 mg via INTRAVENOUS
  Administered 2018-12-22: 10 mg via INTRAVENOUS
  Administered 2018-12-22: 20 mg via INTRAVENOUS
  Administered 2018-12-22 (×2): 10 mg via INTRAVENOUS

## 2018-12-22 MED ORDER — SODIUM CHLORIDE 0.9 % IV SOLN
INTRAVENOUS | Status: DC
  Administered 2018-12-22: 09:00:00 via INTRAVENOUS

## 2018-12-22 MED ORDER — LIDOCAINE HCL (PF) 2 % IJ SOLN
INTRAMUSCULAR | Status: AC
  Filled 2018-12-22: qty 10

## 2018-12-22 MED ORDER — PROPOFOL 500 MG/50ML IV EMUL
INTRAVENOUS | Status: AC
  Filled 2018-12-22: qty 50

## 2018-12-22 MED ORDER — LIDOCAINE HCL (CARDIAC) PF 100 MG/5ML IV SOSY
PREFILLED_SYRINGE | INTRAVENOUS | Status: DC | PRN
  Administered 2018-12-22: 40 mg via INTRAVENOUS

## 2018-12-22 NOTE — Anesthesia Post-op Follow-up Note (Signed)
Anesthesia QCDR form completed.        

## 2018-12-22 NOTE — Op Note (Signed)
[facility] Gastroenterology Patient Name: Natalie [name] Procedure Date: 12/22/2018 8:13 AM MRN: [id number] Account #: [id number] Date of Birth: Feb 13, 1950 Admit Type: Outpatient Age: 68 Room: [facility] ENDO ROOM 4 Gender: Female Note Status: Finalized Procedure:             Colonoscopy Indications:           Screening for colorectal malignant neoplasm Providers:             Kayah Hecker B. Bonna Gains MD, MD Referring MD:          Bethena Roys. Sowles, MD (Referring MD) Medicines:             Monitored Anesthesia Care Complications:         No immediate complications. Procedure:             Pre-Anesthesia Assessment:                        - ASA Grade Assessment: II - A patient with mild                         systemic disease.                        - Prior to the procedure, a History and Physical was                         performed, and patient medications, allergies and                         sensitivities were reviewed. [name] tolerance                         of previous anesthesia was reviewed.                        - The risks and benefits of the procedure and the                         sedation options and risks were discussed with the                         patient. All questions were answered and informed                         consent was obtained.                        - Patient identification and proposed procedure were                         verified prior to the procedure by the physician, the                         nurse, the anesthesiologist, the anesthetist and the                         technician. The procedure was verified in the                         procedure  room.                        After obtaining informed consent, the colonoscope was                         passed under direct vision. Throughout the procedure,                         the patient's blood pressure, pulse, and oxygen                         saturations were  monitored continuously. The                         Colonoscope was introduced through the anus and                         advanced to the the cecum, identified by appendiceal                         orifice and ileocecal valve. The colonoscopy was                         performed with ease. The patient tolerated the                         procedure well. The quality of the bowel preparation                         was good. Findings:      The perianal and digital rectal examinations were normal.      A 6 mm polyp was found in the ascending colon. The polyp was sessile.       The polyp was removed with a cold snare. Resection and retrieval were       complete.      Four sessile polyps were found in the descending colon, transverse colon       and ascending colon. The polyps were 3 to 4 mm in size. These polyps       were removed with a jumbo cold forceps. Resection and retrieval were       complete.      Multiple diverticula were found in the sigmoid colon.      The exam was otherwise without abnormality.      The rectum, sigmoid colon, descending colon, transverse colon, ascending       colon and cecum appeared normal.      The retroflexed view of the distal rectum and anal verge was normal and       showed no anal or rectal abnormalities. Impression:            - One 6 mm polyp in the ascending colon, removed with                         a cold snare. Resected and retrieved.                        - Four 3 to 4 mm polyps in the descending colon, in  the transverse colon and in the ascending colon,                         removed with a jumbo cold forceps. Resected and                         retrieved.                        - Diverticulosis in the sigmoid colon.                        - The examination was otherwise normal.                        - The rectum, sigmoid colon, descending colon,                         transverse colon, ascending colon and  cecum are normal.                        - The distal rectum and anal verge are normal on                         retroflexion view. Recommendation:        - Discharge patient to home (with escort).                        - High fiber diet.                        - Advance diet as tolerated.                        - Continue present medications.                        - Await pathology results.                        - Repeat colonoscopy date to be determined after                         pending pathology results are reviewed.                        - The findings and recommendations were discussed with                         the patient.                        - The findings and recommendations were discussed with                         the patient's family.                        - Return to primary care physician as previously                         scheduled.  Procedure Code(s):     --- Professional ---                        [phone], Colonoscopy, flexible; with removal of                         tumor(s), polyp(s), or other lesion(s) by snare                         technique                        45380, 63, Colonoscopy, flexible; with biopsy, single                         or multiple Diagnosis Code(s):     --- Professional ---                        K63.5, Polyp of colon                        Z12.11, Encounter for screening for malignant neoplasm                         of colon                        K57.30, Diverticulosis of large intestine without                         perforation or abscess without bleeding CPT copyright 2019 American Medical Association. All rights reserved. The codes documented in this report are preliminary and upon coder review may  be revised to meet current compliance requirements.  Vonda Antigua, MD Margretta Sidle B. Bonna Gains MD, MD 12/22/2018 9:43:54 AM This report has been signed electronically. Number of Addenda: 0 Note Initiated On:  12/22/2018 8:13 AM Scope Withdrawal Time: 0 hours 27 minutes 59 seconds  Total Procedure Duration: 0 hours 38 minutes 7 seconds  Estimated Blood Loss:  Estimated blood loss: none.      [facility]

## 2018-12-22 NOTE — H&P (Signed)
Vonda Antigua, MD [address] [place], [place], [place], [address] [address], [place], [place], [place], [address] Phone: [phone]  Fax: [phone]  Primary Care Physician:  [name], MD   Pre-Procedure History & Physical: HPI:  Natalie [name] is a 68 y.o. female is here for a colonoscopy.   Past Medical History:  Diagnosis Date  . Acute bronchospasm due to viral infection   . Anemia    history of  . Depressive disorder   . Epistaxis   . Fatigue   . Hearing loss   . Hypothyroidism   . Memory change   . Migraine   . Numbness and tingling   . Obesity (BMI 30.0-34.9)   . Osteoarthritis   . Osteoporosis   . Polyuria   . Premature menopause   . Seizures ([place])    history of mini seizures, possible migraine induced    Past Surgical History:  Procedure Laterality Date  . ABLATION  2006  . CATARACT EXTRACTION W/ INTRAOCULAR LENS IMPLANT Bilateral   . COLONOSCOPY    . TONSILLECTOMY      Prior to Admission medications   Medication Sig Start Date End Date Taking? Authorizing Provider  buPROPion (WELLBUTRIN SR) 150 MG 12 hr tablet Take 150 mg by mouth 2 (two) times daily.   Yes Lew Dawes, MD  citalopram (CELEXA) 40 MG tablet Take 40 mg by mouth daily.   Yes Lew Dawes, MD  clindamycin-benzoyl peroxide [facility]) gel Apply topically 2 (two) times daily. 10/22/18  Yes Sowles, Drue Stager, MD  levothyroxine (SYNTHROID) 75 MCG tablet Take 1 tablet (75 mcg total) by mouth daily. 10/22/18  Yes Sowles, Drue Stager, MD  Multiple Vitamins-Minerals (MULTIVITAMIN WOMEN 50+ PO) Take 1 tablet by mouth daily.   Yes [provider]  Omega-3 Fatty Acids (FISH OIL) 1000 MG CAPS Take 1 capsule by mouth daily.   Yes [provider]  SUMAtriptan (IMITREX) 100 MG tablet TAKE 1 TABLET BY MOUTH EVERY 2 HOURS AS NEEDED FOR MIGRAINE. MAY REPEAT IN 2 HOURS IF HEADACHE PERSISTS OR RECURS. 10/22/18  Yes Sowles, Drue Stager, MD  topiramate (TOPAMAX) 25 MG tablet  Take 25 mg by mouth daily.    Yes Lew Dawes, MD  Vitamin D, Ergocalciferol, (DRISDOL) 1.25 MG (50000 UT) CAPS capsule Take 1 capsule (50,000 Units total) by mouth every 7 (seven) days. 10/22/18  Yes [name], MD    Allergies as of 11/18/2018 - Review Complete 11/18/2018  Allergen Reaction Noted  . Sulfamethoxazole-trimethoprim Hives 10/26/2014    Family History  Problem Relation Age of Onset  . Early death Father        suicide  . Depression Father   . Diabetes Father   . Cancer Father        prostate  . Hearing loss Father        due to war  . Alzheimer's disease Mother   . Heart disease Brother   . Heart disease Maternal Aunt   . Dementia Maternal Aunt   . Heart disease Maternal Uncle   . Dementia Maternal Grandmother   . Heart disease Brother     Social History   Socioeconomic History  . Marital status: Single    Spouse name: Not on file  . Number of children: 1  . Years of education: Not on file  . Highest education level: Bachelor's degree (e.g., BA, AB, BS)  Occupational History  . Occupation: retired   Scientific laboratory technician  . Financial resource strain:  Not hard at all  . Food insecurity    Worry: Never true    Inability: Never true  . Transportation needs    Medical: No    Non-medical: No  Tobacco Use  . Smoking status: Former Smoker    Packs/day: 1.00    Years: 6.00    Pack years: 6.00    Types: Cigarettes    Quit date: 02/11/1974    Years since quitting: 44.8  . Smokeless tobacco: Never Used  Substance and Sexual Activity  . Alcohol use: Yes    Alcohol/week: 0.0 standard drinks    Comment: very seldom; socially  . Drug use: No  . Sexual activity: Not Currently  Lifestyle  . Physical activity    Days per week: 0 days    Minutes per session: 0 min  . Stress: Not at all  Relationships  . Social connections    Talks on phone: More than three times a week    Gets together: Once a week    Attends religious service: More than 4 times per year     Active member of club or organization: Yes    Attends meetings of clubs or organizations: More than 4 times per year    Relationship status: Never married  . Intimate partner violence    Fear of current or ex partner: No    Emotionally abused: No    Physically abused: No    Forced sexual activity: No  Other Topics Concern  . Not on file  Social History Narrative   Raised an adopted child on her own   Working part time reviewed documents    Review of Systems: See HPI, otherwise negative ROS  Physical Exam: BP 126/77   Pulse 78   Temp (!) 97 F (36.1 C) (Temporal)   Resp 18   Ht 5\' 6"  (1.676 m)   Wt 104.3 kg   SpO2 96%   BMI 37.12 kg/m  General:   Alert,  pleasant and cooperative in NAD Head:  Normocephalic and atraumatic. Neck:  Supple; no masses or thyromegaly. Lungs:  Clear throughout to auscultation, normal respiratory effort.    Heart:  +S1, +S2, Regular rate and rhythm, No edema. Abdomen:  Soft, nontender and nondistended. Normal bowel sounds, without guarding, and without rebound.   Neurologic:  Alert and  oriented x4;  grossly normal neurologically.  Impression/Plan: Natalie [name] is here for a colonoscopy to be performed for average risk screening.  Risks, benefits, limitations, and alternatives regarding  colonoscopy have been reviewed with the patient.  Questions have been answered.  All parties agreeable.   Virgel Manifold, MD  12/22/2018, 8:38 AM

## 2018-12-22 NOTE — Transfer of Care (Signed)
Immediate Anesthesia Transfer of Care Note  Patient: Natalie [name]  Procedure(s) Performed: COLONOSCOPY WITH PROPOFOL (N/A )  Patient Location: PACU  Anesthesia Type:General  Level of Consciousness: awake, oriented and drowsy  Airway & Oxygen Therapy: Patient Spontanous Breathing  Post-op Assessment: Report given to RN and Post -op Vital signs reviewed and stable  Post vital signs: Reviewed and stable  Last Vitals:  Vitals Value Taken Time  BP 87/46 12/22/18 0938  Temp    Pulse 92 12/22/18 0938  Resp 20 12/22/18 0938  SpO2 92 % 12/22/18 0938  Vitals shown include unvalidated device data.  Last Pain:  Vitals:   12/22/18 0936  TempSrc: Temporal  PainSc: 0-No pain         Complications: No apparent anesthesia complications

## 2018-12-22 NOTE — Anesthesia Postprocedure Evaluation (Signed)
Anesthesia Post Note  Patient: Natalie [name]  Procedure(s) Performed: COLONOSCOPY WITH PROPOFOL (N/A )  Patient location during evaluation: PACU Anesthesia Type: General Level of consciousness: awake and alert Pain management: pain level controlled Vital Signs Assessment: post-procedure vital signs reviewed and stable Respiratory status: spontaneous breathing, nonlabored ventilation and respiratory function stable Cardiovascular status: blood pressure returned to baseline and stable Postop Assessment: no apparent nausea or vomiting Anesthetic complications: no     Last Vitals:  Vitals:   12/22/18 0956 12/22/18 1006  BP: 107/72 122/64  Pulse: 62 62  Resp: 17 17  Temp:    SpO2: 100% 100%    Last Pain:  Vitals:   12/22/18 1006  TempSrc:   PainSc: 0-No pain                 Durenda Hurt

## 2018-12-22 NOTE — Anesthesia Preprocedure Evaluation (Signed)
Anesthesia Evaluation  Patient identified by MRN, date of birth, ID band Patient awake    Reviewed: Allergy & Precautions, H&P , NPO status , Patient's Chart, lab work & pertinent test results  Airway Mallampati: III  TM Distance: >3 FB     Dental  (+) Chipped   Pulmonary neg COPD, former smoker,           Cardiovascular (-) angina(-) Past MI and (-) Cardiac Stents negative cardio ROS  (-) dysrhythmias      Neuro/Psych  Headaches, Seizures -,  PSYCHIATRIC DISORDERS Depression    GI/Hepatic negative GI ROS, Neg liver ROS,   Endo/Other  Hypothyroidism   Renal/GU negative Renal ROS  negative genitourinary   Musculoskeletal   Abdominal   Peds  Hematology   Anesthesia Other Findings Past Medical History: No date: Acute bronchospasm due to viral infection No date: Anemia     Comment:  history of No date: Depressive disorder No date: Epistaxis No date: Fatigue No date: Hearing loss No date: Hypothyroidism No date: Memory change No date: Migraine No date: Numbness and tingling No date: Obesity (BMI 30.0-34.9) No date: Osteoarthritis No date: Osteoporosis No date: Polyuria No date: Premature menopause No date: Seizures ([facility])     Comment:  history of mini seizures, possible migraine induced  Past Surgical History: 2006: ABLATION Obesity  No date: CATARACT EXTRACTION W/ INTRAOCULAR LENS IMPLANT; Bilateral No date: COLONOSCOPY No date: TONSILLECTOMY  BMI    Body Mass Index: 37.12 kg/m      Reproductive/Obstetrics negative OB ROS                             Anesthesia Physical Anesthesia Plan  ASA: II  Anesthesia Plan: General   Post-op Pain Management:    Induction:   PONV Risk Score and Plan: Propofol infusion and TIVA  Airway Management Planned: Natural Airway and Nasal Cannula  Additional Equipment:   Intra-op Plan:   Post-operative Plan:   Informed Consent:  I have reviewed the patients History and Physical, chart, labs and discussed the procedure including the risks, benefits and alternatives for the proposed anesthesia with the patient or authorized representative who has indicated his/her understanding and acceptance.     Dental Advisory Given  Plan Discussed with: Anesthesiologist, CRNA and Surgeon  Anesthesia Plan Comments:         Anesthesia Quick Evaluation

## 2018-12-23 ENCOUNTER — Encounter: Payer: Self-pay | Admitting: Gastroenterology

## 2018-12-23 LAB — SURGICAL PATHOLOGY

## 2018-12-29 ENCOUNTER — Encounter: Payer: Self-pay | Admitting: Gastroenterology

## 2019-01-07 ENCOUNTER — Ambulatory Visit (INDEPENDENT_AMBULATORY_CARE_PROVIDER_SITE_OTHER): Payer: Medicare Other

## 2019-01-07 DIAGNOSIS — E038 Other specified hypothyroidism: Secondary | ICD-10-CM | POA: Diagnosis not present

## 2019-01-07 DIAGNOSIS — R739 Hyperglycemia, unspecified: Secondary | ICD-10-CM

## 2019-01-07 NOTE — Chronic Care Management (AMB) (Signed)
Chronic Care Management   Initial Visit Note  01/07/2019 Name: Natalie [name] MRN: [id number] DOB: 1950-03-15  Primary Care Provider: [name], MD Reason for referral : Chronic Care Management   Natalie [name] is a 68 y.o. year old female who is a primary care patient of [name], MD. The CCM team was consulted for assistance with chronic disease management and care coordination needs. A telephonic assessment was conducted today.  Review of Natalie [name]'s status, including review of consultants reports, relevant labs and test results was conducted today. Collaboration with appropriate care team members was performed as part of the comprehensive evaluation and provision of chronic care management services.    SDOH (Social Determinants of Health) screening performed today.  Medications: Outpatient Encounter Medications as of 01/07/2019  Medication Sig  . buPROPion (WELLBUTRIN SR) 150 MG 12 hr tablet Take 150 mg by mouth 2 (two) times daily.  . citalopram (CELEXA) 40 MG tablet Take 40 mg by mouth daily.  . clindamycin-benzoyl peroxide (BENZACLIN) gel Apply topically 2 (two) times daily.  Marland Kitchen levothyroxine (SYNTHROID) 75 MCG tablet Take 1 tablet (75 mcg total) by mouth daily.  . Multiple Vitamins-Minerals (MULTIVITAMIN WOMEN 50+ PO) Take 1 tablet by mouth daily.  . Omega-3 Fatty Acids (FISH OIL) 1000 MG CAPS Take 1 capsule by mouth daily.  . SUMAtriptan (IMITREX) 100 MG tablet TAKE 1 TABLET BY MOUTH EVERY 2 HOURS AS NEEDED FOR MIGRAINE. MAY REPEAT IN 2 HOURS IF HEADACHE PERSISTS OR RECURS.  Marland Kitchen topiramate (TOPAMAX) 25 MG tablet Take 25 mg by mouth daily.   . Vitamin D, Ergocalciferol, (DRISDOL) 1.25 MG (50000 UT) CAPS capsule Take 1 capsule (50,000 Units total) by mouth every 7 (seven) days.   No facility-administered encounter medications on file as of 01/07/2019.       Goals Addressed            This Visit's Progress   . Chronic Care Management        Current  Barriers:  . Chronic Disease Management support and education needs related to elevated blood glucose, hypothyroidism and degenerative arthritis.  Case Manager Clinical Goal(s):  Marland Kitchen Over the next 90 days, patient will take all medications as prescribed. . Over the next 90 days, patient will attend all scheduled medical appointments. . Over the next 90 days, patient will continue adherence with recommended cardiac prudent diet. . Over the next 90 days, patient will follow recommended safety measures to prevent falls and injuries.  Interventions:  . Reviewed medications. Encouraged to take medications as prescribed. Encouraged to notify care management team with concerns regarding medication management or prescription costs. . Discussed dietary intake and lab values. Encouraged to continue compliance with recommended diet. . Discussed activity tolerance and ability to perform ADL's. Reviewed safety and fall prevention measures. Encouraged to practice caution when ambulating and utilize/request assistive device if needed. (Hx of degenerative arthritis) . Reviewed pending appointments. Encourage to attend appointments as scheduled to prevent delays in care. . Discussed plans for ongoing care management follow up. Provided direct contact information for care management team  Patient Self Care Activities:  . Self administers medications . Attends scheduled provider appointments . Calls pharmacy for medication refills . Performs ADL's independently . Performs IADL's independently   Initial goal documentation            Natalie [name] was given information about Chronic Care Management services  including:  1. CCM service includes personalized support from designated clinical staff supervised by her physician,  including individualized plan of care and coordination with other care providers 2. 24/7 contact phone numbers for assistance for urgent and routine care needs. 3. Service will only be  billed when office clinical staff spend 20 minutes or more in a month to coordinate care. 4. Only one practitioner may furnish and bill the service in a calendar month. 5. The patient may stop CCM services at any time (effective at the end of the month) by phone call to the office staff. 6. The patient will be responsible for cost sharing (co-pay) of up to 20% of the service fee (after annual deductible is met).  Patient agreed to services and verbal consent obtained.     PLAN The care management team will follow up with Natalie [name] in two months. She was encouraged to call with any health related concerns if needed.   [facility] Care Management [phone]

## 2019-01-11 ENCOUNTER — Other Ambulatory Visit: Payer: Medicare Other

## 2019-01-18 ENCOUNTER — Other Ambulatory Visit: Payer: Self-pay | Admitting: Family Medicine

## 2019-01-18 DIAGNOSIS — G43001 Migraine without aura, not intractable, with status migrainosus: Secondary | ICD-10-CM

## 2019-03-28 ENCOUNTER — Ambulatory Visit: Payer: Self-pay

## 2019-03-28 ENCOUNTER — Telehealth: Payer: Self-pay

## 2019-03-28 NOTE — Chronic Care Management (AMB) (Signed)
  Chronic Care Management   Outreach Note  03/28/2019 Name: Natalie [name] MRN: [id number] DOB: 1950/11/30  Primary Care Provider: [name], MD Reason for referral : Chronic Care Management   An unsuccessful telephone outreach was attempted today. [name] was referred to the case management team for assistance with care management and care coordination.    Follow Up Plan: The care management team will reach out to [name] again within the next week.     [facility] Care Management [phone]

## 2019-04-04 ENCOUNTER — Ambulatory Visit (INDEPENDENT_AMBULATORY_CARE_PROVIDER_SITE_OTHER): Payer: Medicare Other

## 2019-04-04 DIAGNOSIS — M810 Age-related osteoporosis without current pathological fracture: Secondary | ICD-10-CM | POA: Diagnosis not present

## 2019-04-04 NOTE — Chronic Care Management (AMB) (Signed)
Chronic Care Management   Follow Up Note   04/04/2019 Name: Natalie [name] MRN: [id number] DOB: 09-Jun-1950  Primary Care Provider by: [name], MD Reason for referral : Chronic Care Management  Natalie [name] is a 69 y.o. year old female who is a primary care patient of [name], MD. She is currently engaged with the chronic care management team. A routine telephonic outreach was conducted today.  Natalie [name] reports receiving the initial Covid-19 vaccination dose. She anticipates receiving the second dose on 04/05/19.  Review of Natalie [name]'s status, including review of consultants reports, relevant labs and test results was conducted today. Collaboration with appropriate care team members was performed as part of the comprehensive evaluation and provision of chronic care management services.     SDOH (Social Determinants of Health) assessments performed: Yes See Care Plan activities for detailed interventions related to SDOH)  SDOH Interventions     Most Recent Value  SDOH Interventions  SDOH Interventions for the Following Domains  Depression  Depression Interventions/Treatment   Currently on Treatment        Outpatient Encounter Medications as of 04/04/2019  Medication Sig  . buPROPion (WELLBUTRIN SR) 150 MG 12 hr tablet Take 150 mg by mouth 2 (two) times daily.  . citalopram (CELEXA) 40 MG tablet Take 40 mg by mouth daily.  . clindamycin-benzoyl peroxide (BENZACLIN) gel Apply topically 2 (two) times daily.  Marland Kitchen levothyroxine (SYNTHROID) 75 MCG tablet Take 1 tablet (75 mcg total) by mouth daily.  . Multiple Vitamins-Minerals (MULTIVITAMIN WOMEN 50+ PO) Take 1 tablet by mouth daily.  . Omega-3 Fatty Acids (FISH OIL) 1000 MG CAPS Take 1 capsule by mouth daily.  . SUMAtriptan (IMITREX) 100 MG tablet TAKE 1 TABLET BY MOUTH EVERY 2 HOURS AS NEEDED FOR MIGRAINE. MAY REPEAT IN 2 HOURS IF HEADACHE PERSISTS OR RECURS  . topiramate (TOPAMAX) 25 MG tablet Take 25 mg by mouth  daily.   . Vitamin D, Ergocalciferol, (DRISDOL) 1.25 MG (50000 UT) CAPS capsule Take 1 capsule (50,000 Units total) by mouth every 7 (seven) days.   No facility-administered encounter medications on file as of 04/04/2019.     Objective:  BP Readings from Last 3 Encounters:  12/22/18 122/64  04/23/18 120/80  12/30/17 132/84    Lab Results  Component Value Date   CHOL 160 04/23/2018   HDL 41 (L) 04/23/2018   LDLCALC 102 (H) 04/23/2018   TRIG 76 04/23/2018   CHOLHDL 3.9 04/23/2018     Goals Addressed            This Visit's Progress   . Chronic Care Management        Current Barriers:  . Chronic Disease Management support and education needs related to elevated blood glucose, hypothyroidism and degenerative arthritis.  Case Manager Clinical Goal(s):  Marland Kitchen Over the next 90 days, patient will take all medications as prescribed. . Over the next 90 days, patient will attend all scheduled medical appointments. . Over the next 90 days, patient will continue adherence with recommended cardiac prudent diet. . Over the next 90 days, patient will follow recommended safety measures to prevent falls and injuries.  Interventions:  . Reviewed medications. Encouraged to take medications as prescribed. Encouraged to notify care management team with concerns regarding medication management or prescription costs. Reports taking medications as prescribed. Reports recent cost increase with a few prescriptions. Declined current need for medication assistance. Encouraged to notify team for assistance is needed. . Discussed dietary intake and lab  values. Encouraged to continue compliance with recommended diet. . Discussed activity tolerance and ability to perform ADL's. Reviewed safety and fall prevention measures. Encouraged to practice caution when ambulating and utilize/request assistive device if needed. (Hx of degenerative arthritis) Reports doing fairly well with most activities. Reports previously  being diagnosed with Dupuytren's contracture and noticing additional "lumps" and "knots" on her right hand. Reports the growths have not impacted the function of her hand. She plans to discuss this during her next provider outreach. Reports walking in the park regularly without difficulty. Reports fatigue with prolonged activity but otherwise doing well. No recent falls. . Reviewed pending appointments. Encourage to attend appointments as scheduled to prevent delays in care.  . Discussed plans for ongoing care management follow up. Provided direct contact information for care management team  Patient Self Care Activities:  . Self administers medications . Attends scheduled provider appointments . Calls pharmacy for medication refills . Performs ADL's independently . Performs IADL's independently   Please see past updates related to this goal by clicking on the "Past Updates" button in the selected goal          PLAN:  The care management team will follow up with Natalie [name] next month.   [facility] [phone]

## 2019-04-09 ENCOUNTER — Other Ambulatory Visit: Payer: Self-pay | Admitting: Family Medicine

## 2019-04-09 DIAGNOSIS — E559 Vitamin D deficiency, unspecified: Secondary | ICD-10-CM

## 2019-04-09 NOTE — Telephone Encounter (Signed)
Requested medication (s) are due for refill today   YES  Requested medication (s) are on the active medication list  Yes  Future visit scheduled Yes  No current order to continue this medication. Last Vit D 04/23/18 WNL.   Routing to provider for consideration.   Requested Prescriptions  Pending Prescriptions Disp Refills   Vitamin D, Ergocalciferol, (DRISDOL) 1.25 MG (50000 UNIT) CAPS capsule [Pharmacy Med Name: VITAMIN D2 50,000IU (ERGO) CAP RX] 12 capsule 1    Sig: TAKE 1 CAPSULE BY MOUTH EVERY 7 DAYS      Endocrinology:  Vitamins - Vitamin D Supplementation Failed - 04/09/2019 11:50 AM      Failed - 50,000 IU strengths are not delegated      Failed - Phosphate in normal range and within 360 days    No results found for: PHOS        Passed - Ca in normal range and within 360 days    Calcium  Date Value Ref Range Status  04/23/2018 9.0 8.6 - 10.4 mg/dL Final          Passed - Vitamin D in normal range and within 360 days    Vit D, 25-Hydroxy  Date Value Ref Range Status  04/23/2018 42 30 - 100 ng/mL Final    Comment:    Vitamin D Status         25-OH Vitamin D: . Deficiency:                    <20 ng/mL Insufficiency:             20 - 29 ng/mL Optimal:                 > or = 30 ng/mL . For 25-OH Vitamin D testing on patients on  D2-supplementation and patients for whom quantitation  of D2 and D3 fractions is required, the QuestAssureD(TM) 25-OH VIT D, (D2,D3), LC/MS/MS is recommended: order  code [id number] (patients >42yrs). . For more information on this test, go to: http://education.questdiagnostics.com/faq/FAQ163 (This link is being provided for  informational/educational purposes only.)           Passed - Valid encounter within last 12 months    Recent Outpatient Visits           5 months ago Migraine without aura and with status migrainosus, not intractable   [facility] [name], MD   11 months ago Moderate major depression  [facility])   [facility] [name], MD   1 year ago Ocular migraine   [facility] [place], Drue Stager, MD   1 year ago Migraine without aura and with status migrainosus, not intractable   [facility] [name], MD   1 year ago Encounter for preventive care   [facility] [name], MD       Future Appointments             In 1 month Ancil Boozer, Drue Stager, MD [facility] [place], [place]   In 7 months  [facility]

## 2019-04-22 ENCOUNTER — Ambulatory Visit: Payer: Medicare Other | Admitting: Family Medicine

## 2019-05-09 ENCOUNTER — Ambulatory Visit: Payer: Medicare Other | Admitting: Family Medicine

## 2019-05-10 ENCOUNTER — Other Ambulatory Visit (HOSPITAL_COMMUNITY)
Admission: RE | Admit: 2019-05-10 | Discharge: 2019-05-10 | Disposition: A | Discharge status: Home / Self Care | Payer: Medicare Other | Source: Ambulatory Visit | Attending: Family Medicine | Admitting: Family Medicine

## 2019-05-10 ENCOUNTER — Encounter: Payer: Self-pay | Admitting: Family Medicine

## 2019-05-10 ENCOUNTER — Other Ambulatory Visit: Payer: Self-pay

## 2019-05-10 ENCOUNTER — Ambulatory Visit (INDEPENDENT_AMBULATORY_CARE_PROVIDER_SITE_OTHER): Payer: Medicare Other | Admitting: Family Medicine

## 2019-05-10 VITALS — BP 126/84 | HR 90 | Temp 97.1°F | Resp 18 | Ht 66.0 in | Wt 235.7 lb

## 2019-05-10 DIAGNOSIS — R2233 Localized swelling, mass and lump, upper limb, bilateral: Secondary | ICD-10-CM

## 2019-05-10 DIAGNOSIS — E538 Deficiency of other specified B group vitamins: Secondary | ICD-10-CM | POA: Diagnosis not present

## 2019-05-10 DIAGNOSIS — L709 Acne, unspecified: Secondary | ICD-10-CM | POA: Diagnosis not present

## 2019-05-10 DIAGNOSIS — G43001 Migraine without aura, not intractable, with status migrainosus: Secondary | ICD-10-CM

## 2019-05-10 DIAGNOSIS — Z780 Asymptomatic menopausal state: Secondary | ICD-10-CM | POA: Insufficient documentation

## 2019-05-10 DIAGNOSIS — M8949 Other hypertrophic osteoarthropathy, multiple sites: Secondary | ICD-10-CM | POA: Diagnosis not present

## 2019-05-10 DIAGNOSIS — R87810 Cervical high risk human papillomavirus (HPV) DNA test positive: Secondary | ICD-10-CM | POA: Insufficient documentation

## 2019-05-10 DIAGNOSIS — E559 Vitamin D deficiency, unspecified: Secondary | ICD-10-CM | POA: Diagnosis not present

## 2019-05-10 DIAGNOSIS — R05 Cough: Secondary | ICD-10-CM

## 2019-05-10 DIAGNOSIS — R058 Other specified cough: Secondary | ICD-10-CM

## 2019-05-10 DIAGNOSIS — M810 Age-related osteoporosis without current pathological fracture: Secondary | ICD-10-CM | POA: Diagnosis not present

## 2019-05-10 DIAGNOSIS — R197 Diarrhea, unspecified: Secondary | ICD-10-CM

## 2019-05-10 DIAGNOSIS — E785 Hyperlipidemia, unspecified: Secondary | ICD-10-CM

## 2019-05-10 DIAGNOSIS — M159 Polyosteoarthritis, unspecified: Secondary | ICD-10-CM

## 2019-05-10 DIAGNOSIS — D692 Other nonthrombocytopenic purpura: Secondary | ICD-10-CM | POA: Diagnosis not present

## 2019-05-10 DIAGNOSIS — N3941 Urge incontinence: Secondary | ICD-10-CM

## 2019-05-10 DIAGNOSIS — F339 Major depressive disorder, recurrent, unspecified: Secondary | ICD-10-CM | POA: Diagnosis not present

## 2019-05-10 DIAGNOSIS — Z124 Encounter for screening for malignant neoplasm of cervix: Secondary | ICD-10-CM | POA: Insufficient documentation

## 2019-05-10 DIAGNOSIS — IMO0002 Reserved for concepts with insufficient information to code with codable children: Secondary | ICD-10-CM

## 2019-05-10 DIAGNOSIS — Z79899 Other long term (current) drug therapy: Secondary | ICD-10-CM | POA: Diagnosis not present

## 2019-05-10 DIAGNOSIS — R739 Hyperglycemia, unspecified: Secondary | ICD-10-CM | POA: Diagnosis not present

## 2019-05-10 DIAGNOSIS — E039 Hypothyroidism, unspecified: Secondary | ICD-10-CM

## 2019-05-10 DIAGNOSIS — B977 Papillomavirus as the cause of diseases classified elsewhere: Secondary | ICD-10-CM

## 2019-05-10 MED ORDER — SUMATRIPTAN SUCCINATE 100 MG PO TABS: ORAL_TABLET | ORAL | 0 refills | Status: DC

## 2019-05-10 MED ORDER — CLINDAMYCIN PHOS-BENZOYL PEROX 1-5 % EX GEL: Freq: Two times a day (BID) | CUTANEOUS | 0 refills | Status: DC

## 2019-05-10 NOTE — Addendum Note (Signed)
Addended by: [name] F on: 05/10/2019 05:27 PM   Modules accepted: Orders

## 2019-05-10 NOTE — Progress Notes (Addendum)
Name: Natalie [name]   MRN: [id number]    DOB: 04/17/1950   Date:05/10/2019       Progress Note  Subjective  Chief Complaint  Chief Complaint  Patient presents with  . Depression  . Migraine  . Hypothyroidism  . Medication Refill    6 month F/U    HPI  Osteoporosis: on right femur, she is fair skin, family history of osteoporosis. She would like to hold off on all medications at this time. She has increased calcium in her diet.Continue Vitamin Dsupplements We will recheck labs, including vitamin D level   Urge Incontinence: she drinks 8 glassed of water daily, she has urinary frequency and urgency for years. Discussed therapy but she wants to hold off for now   Skin problems: she has noticed small bumps on the palm of her hands, initially on left hand but now also on right side, worse between 4th and 5 th fingers, no redness, no pruritis or pain, she states she can feel and see it but very small. No associated weakness or stiffness . She is worries about Dupuytren's contracture , explained that we can refer to Ortho but for now she states she just wants to document it . It has been going on for a couple of years on left hand and since 2020 on right hand also   B12 deficiency:last B12 was at goal . She has been taking supplements intermittentlybut not SL. She is due labs   Hypothyroidism: she has been taking medication as prescribed, she states hair is not falling as much, she has intermittent  dry skin, no constipation, she has noticed some diarrhea intermittently when she eats something different ( sweets/spicy food) , she is up to date with colonoscopy   Dyslipidemia: she does not want to start statin therapy,we will recheck labs   OA: takingTylenol prn , off meloxicam and is doing well at this time, right knee used to pop but not that often lately   Moderate Depression: still sees Dr. Thurmond Butts, states mood has been stable, compliant with medication,she states never in  remission, not seeing therapist, she denies suicidal thoughts or ideation.Phq 9 isokay, she states she is bored, worries about ADD, she states no motivation and her house has not been cleaned for years. Advised to discuss it with Dr. Thurmond Butts   Migraine headaches: she is on Topamax she states that episodes are on average1in a month but episodes can last a few days. Pain is described as right temporal or parietal area, piercing like and at times aching but other times feels like a band on top of her head. She states Imitrex resolves symptomsmost of the times ,occasionally has to take a second dose the following morning.Stable    Morbid obesity: BMI above 35 with co-morbidity dyslipidemia and osteoarthritis. Discussed importance of weight loss. She will try to take her dogs for walks again   Adult Acne: she states on her shin, painful pimples on her chin, one or two spots, she has been using topical medication and is doing well   Dry cough: going on for a long time, it started months ago, usually resolves after season change and is still present. She used to be a smoker but quit in 1976. Discussed CXR, CT scan lung, referral to pulmonologist or start allergy medication, she will try the later     Patient Active Problem List   Diagnosis Date Noted  . HPV test positive 07/08/2015  . Elevated blood sugar 06/28/2015  .  Difficulty hearing 06/28/2015  . Arthritis, degenerative 06/28/2015  . Obesity, Class I, BMI 30-34.9 06/28/2015  . Paresthesia 06/28/2015  . Purpura, nonthrombopenic ([place]) 06/28/2015  . Skin lesion of right leg 06/28/2015  . Other specified hypothyroidism 08/09/2014  . Migraine without aura and without status migrainosus, not intractable 09/19/2008  . Moderate major depression ([place]) 09/07/2006  . OP (osteoporosis) 09/07/2006    Past Surgical History:  Procedure Laterality Date  . ABLATION  2006  . CATARACT EXTRACTION W/ INTRAOCULAR LENS IMPLANT Bilateral   .  COLONOSCOPY    . COLONOSCOPY WITH PROPOFOL N/A 12/22/2018   Procedure: COLONOSCOPY WITH PROPOFOL;  Surgeon: Virgel Manifold, MD;  Location: [facility] ENDOSCOPY;  Service: Endoscopy;  Laterality: N/A;  . TONSILLECTOMY      Family History  Problem Relation Age of Onset  . Early death Father        suicide  . Depression Father   . Diabetes Father   . Cancer Father        prostate  . Hearing loss Father        due to war  . Alzheimer's disease Mother   . Heart disease Brother   . Heart disease Maternal Aunt   . Dementia Maternal Aunt   . Heart disease Maternal Uncle   . Dementia Maternal Grandmother   . Heart disease Brother     Social History   Tobacco Use  . Smoking status: Former Smoker    Packs/day: 1.00    Years: 6.00    Pack years: 6.00    Types: Cigarettes    Quit date: 02/11/1974    Years since quitting: 45.2  . Smokeless tobacco: Never Used  Substance Use Topics  . Alcohol use: Yes    Alcohol/week: 0.0 standard drinks    Comment: very seldom; socially     Current Outpatient Medications:  .  buPROPion (WELLBUTRIN SR) 150 MG 12 hr tablet, Take 150 mg by mouth 2 (two) times daily., Disp: , Rfl:  .  citalopram (CELEXA) 40 MG tablet, Take 40 mg by mouth daily., Disp: , Rfl:  .  clindamycin-benzoyl peroxide (BENZACLIN) gel, Apply topically 2 (two) times daily., Disp: 50 g, Rfl: 0 .  levothyroxine (SYNTHROID) 75 MCG tablet, Take 1 tablet (75 mcg total) by mouth daily., Disp: 90 tablet, Rfl: 1 .  Multiple Vitamins-Minerals (MULTIVITAMIN WOMEN 50+ PO), Take 1 tablet by mouth daily., Disp: , Rfl:  .  Omega-3 Fatty Acids (FISH OIL) 1000 MG CAPS, Take 1 capsule by mouth daily., Disp: , Rfl:  .  SUMAtriptan (IMITREX) 100 MG tablet, TAKE 1 TABLET BY MOUTH EVERY 2 HOURS AS NEEDED FOR MIGRAINE. MAY REPEAT IN 2 HOURS IF HEADACHE PERSISTS OR RECURS, Disp: 10 tablet, Rfl: 0 .  topiramate (TOPAMAX) 25 MG tablet, Take 25 mg by mouth daily. , Disp: , Rfl:  .  Vitamin D,  Ergocalciferol, (DRISDOL) 1.25 MG (50000 UNIT) CAPS capsule, TAKE 1 CAPSULE BY MOUTH EVERY 7 DAYS, Disp: 12 capsule, Rfl: 1  Allergies  Allergen Reactions  . Sulfamethoxazole-Trimethoprim Hives    I personally reviewed active problem list, medication list, allergies, family history, social history, health maintenance with the patient/caregiver today.   ROS  Constitutional: Negative for fever or weight change.  Respiratory: Negative for cough and shortness of breath.   Cardiovascular: Negative for chest pain or palpitations.  Gastrointestinal: Negative for abdominal pain, no bowel changes.  Musculoskeletal: Negative for gait problem or joint swelling.  Skin: Negative for rash.  Neurological: Negative  for dizziness or headache.  No other specific complaints in a complete review of systems (except as listed in HPI above).  Objective  Vitals:   05/10/19 1403  BP: 126/84  Pulse: 90  Resp: 18  Temp: (!) 97.1 F (36.2 C)  TempSrc: Temporal  SpO2: 95%  Weight: 235 lb 11.2 oz (106.9 kg)  Height: 5\' 6"  (1.676 m)    Body mass index is 38.04 kg/m.  Physical Exam  Constitutional: Patient appears well-developed and well-nourished. Obese  No distress.  HEENT: head atraumatic, normocephalic, pupils equal and reactive to light Cardiovascular: Normal rate, regular rhythm and normal heart sounds.  No murmur heard. No BLE edema. Pulmonary/Chest: Effort normal and breath sounds normal. No respiratory distress. Abdominal: Soft.  There is no tenderness. Skin: bruises on legs, some scarring from picking on skin/legs, also some facial acne  Psychiatric: Patient has a normal mood and affect. behavior is normal. Judgment and thought content normal.  Results of the [place] flowsheet 05/10/2019  Sitting and reading 2  Watching TV 2  Sitting, inactive in a public place (e.g. a theatre or a meeting) 0  As a passenger in a car for an hour without a break 1  Lying down to rest in the afternoon  when circumstances permit 3  Sitting and talking to someone 0  Sitting quietly after a lunch without alcohol 2  In a car, while stopped for a few minutes in traffic 0  Total score 10    PHQ2/9: Depression screen [facility] 2/9 05/10/2019 11/18/2018 10/22/2018 04/23/2018 12/30/2017  Decreased Interest 1 2 1  0 0  Down, Depressed, Hopeless 1 2 1  0 3  PHQ - 2 Score 2 4 2  0 3  Altered sleeping 0 3 0 0 1  Tired, decreased energy 1 3 0 1 1  Change in appetite 1 2 0 1 -  Feeling bad or failure about yourself  0 1 0 0 0  Trouble concentrating 0 2 0 0 0  Moving slowly or fidgety/restless 0 2 0 0 0  Suicidal thoughts 0 0 0 0 0  PHQ-9 Score 4 17 2 2 5   Difficult doing work/chores Somewhat difficult Somewhat difficult Not difficult at all Not difficult at all Somewhat difficult  Some recent data might be hidden    phq 9 is positive  GAD 7 : Generalized Anxiety Score 05/10/2019 11/24/2017  Nervous, Anxious, on Edge 0 0  Control/stop worrying 0 0  Worry too much - different things 0 0  Trouble relaxing 0 0  Restless 0 0  Easily annoyed or irritable 0 0  Afraid - awful might happen 0 0  Total GAD 7 Score 0 0  Anxiety Difficulty Not difficult at all Not difficult at all     Fall Risk: Fall Risk  05/10/2019 04/04/2019 01/07/2019 11/18/2018 10/22/2018  Falls in the past year? 0 0 1 1 0  Number falls in past yr: 0 - 1 1 1   Injury with Fall? 0 - 0 0 0  Comment - - - - -  Risk for fall due to : - Medication side effect Impaired balance/gait Impaired balance/gait -  Risk for fall due to: Comment - - - - -  Follow up - Falls prevention discussed Falls prevention discussed Falls prevention discussed -     Functional Status Survey: Is the patient deaf or have difficulty hearing?: No Does the patient have difficulty seeing, even when wearing glasses/contacts?: No Does the patient have difficulty concentrating, remembering, or making  decisions?: No Does the patient have difficulty walking or climbing stairs?:  No Does the patient have difficulty dressing or bathing?: No Does the patient have difficulty doing errands alone such as visiting a doctor's office or shopping?: No    Assessment & Plan  1. Migraine without aura and with status migrainosus, not intractable  - SUMAtriptan (IMITREX) 100 MG tablet; May repeat in 2 hours if headache persists or recurs.  Dispense: 10 tablet; Refill: 0  2. Obesity BMI above 35    3. Major depression, recurrent, chronic ([place])  Under the care of psychiatrist   4. Dyslipidemia  - Lipid panel  5. Vitamin D deficiency  - VITAMIN D 25 Hydroxy (Vit-D Deficiency, Fractures)  6. Age-related osteoporosis without current pathological fracture  Not on medication   7. Primary osteoarthritis involving multiple joints   8. Adult hypothyroidism  - TSH  9. Adult acne  - clindamycin-benzoyl peroxide (BENZACLIN) gel; Apply topically 2 (two) times daily.  Dispense: 50 g; Refill: 0  10. Hyperglycemia  - Hemoglobin A1c  11. Purpura, nonthrombopenic ([place])  stable  12. B12 deficiency  - Vitamin B12  13. Long-term use of high-risk medication  - CBC with Differential/Platelet - COMPLETE METABOLIC PANEL WITH GFR  14. Nodule of skin of both hands  Discussed referral to Ortho but she wants to hold off for now   15. Urge incontinence  Discussed medication, she is worried about taking another medication  16. Intermittent diarrhea  Seems to be triggered by diet, no blood in stools.    17. Human papilloma virus (HPV) DNA test positive  We will recheck it today

## 2019-05-11 LAB — CBC WITH DIFFERENTIAL/PLATELET
Absolute Monocytes: 330 cells/uL (ref 200–950)
Basophils Absolute: 18 cells/uL (ref 0–200)
Basophils Relative: 0.3 %
Eosinophils Absolute: 130 cells/uL (ref 15–500)
Eosinophils Relative: 2.2 %
HCT: 42.1 % (ref 35.0–45.0)
Hemoglobin: 13.9 g/dL (ref 11.7–15.5)
Lymphs Abs: 1670 cells/uL (ref 850–3900)
MCH: 30.9 pg (ref 27.0–33.0)
MCHC: 33 g/dL (ref 32.0–36.0)
MCV: 93.6 fL (ref 80.0–100.0)
MPV: 9.4 fL (ref 7.5–12.5)
Monocytes Relative: 5.6 %
Neutro Abs: 3752 cells/uL (ref 1500–7800)
Neutrophils Relative %: 63.6 %
Platelets: 238 10*3/uL (ref 140–400)
RBC: 4.5 10*6/uL (ref 3.80–5.10)
RDW: 11.8 % (ref 11.0–15.0)
Total Lymphocyte: 28.3 %
WBC: 5.9 10*3/uL (ref 3.8–10.8)

## 2019-05-11 LAB — COMPLETE METABOLIC PANEL WITH GFR
AG Ratio: 1.5 (calc) (ref 1.0–2.5)
ALT: 17 U/L (ref 6–29)
AST: 20 U/L (ref 10–35)
Albumin: 3.8 g/dL (ref 3.6–5.1)
Alkaline phosphatase (APISO): 123 U/L (ref 37–153)
BUN: 16 mg/dL (ref 7–25)
CO2: 27 mmol/L (ref 20–32)
Calcium: 9.1 mg/dL (ref 8.6–10.4)
Chloride: 107 mmol/L (ref 98–110)
Creat: 0.75 mg/dL (ref 0.50–0.99)
GFR, Est African American: 94 mL/min/{1.73_m2} (ref >=60)
GFR, Est Non African American: 81 mL/min/{1.73_m2} (ref >=60)
Globulin: 2.5 g/dL (calc) (ref 1.9–3.7)
Glucose, Bld: 90 mg/dL (ref 65–99)
Potassium: 4.1 mmol/L (ref 3.5–5.3)
Sodium: 141 mmol/L (ref 135–146)
Total Bilirubin: 0.5 mg/dL (ref 0.2–1.2)
Total Protein: 6.3 g/dL (ref 6.1–8.1)

## 2019-05-11 LAB — HEMOGLOBIN A1C
Hgb A1c MFr Bld: 5.2 % of total Hgb (ref <5.7)
Mean Plasma Glucose: 103 (calc)
eAG (mmol/L): 5.7 (calc)

## 2019-05-11 LAB — LIPID PANEL
Cholesterol: 178 mg/dL (ref <200)
HDL: 41 mg/dL — ABNORMAL LOW (ref >=50)
LDL Cholesterol (Calc): 118 mg/dL (calc) — ABNORMAL HIGH
Non-HDL Cholesterol (Calc): 137 mg/dL (calc) — ABNORMAL HIGH (ref <130)
Total CHOL/HDL Ratio: 4.3 (calc) (ref <5.0)
Triglycerides: 86 mg/dL (ref <150)

## 2019-05-11 LAB — VITAMIN B12: Vitamin B-12: 493 pg/mL (ref 200–1100)

## 2019-05-11 LAB — VITAMIN D 25 HYDROXY (VIT D DEFICIENCY, FRACTURES): Vit D, 25-Hydroxy: 73 ng/mL (ref 30–100)

## 2019-05-11 LAB — TSH: TSH: 2.63 mIU/L (ref 0.40–4.50)

## 2019-05-13 LAB — CYTOLOGY - PAP
Comment: NEGATIVE
Diagnosis: NEGATIVE
High risk HPV: POSITIVE — AB

## 2019-05-16 ENCOUNTER — Telehealth: Payer: Self-pay

## 2019-05-16 ENCOUNTER — Ambulatory Visit: Payer: Self-pay

## 2019-05-16 NOTE — Chronic Care Management (AMB) (Signed)
  Chronic Care Management   Outreach Note  05/16/2019 Name: LENNAN WESELY MRN: [id number] DOB: 26-Sep-1950  Primary Care Provider: [name], MD Reason for referral : Chronic Care Management   An unsuccessful telephone outreach was attempted today. Ms. Abila was initially referred to the case management team for assistance with care management and care coordination.     Follow Up Plan The care management team will reach out to Ms. Borgerding again within the next two weeks.    [facility] Care [facility] [phone]

## 2019-05-23 NOTE — Chronic Care Management (AMB) (Unsigned)
This encounter was created in error. Please disregard. 

## 2019-05-24 NOTE — Patient Instructions (Addendum)
Thank you for allowing the Chronic Care Management team to participate in your care.  Goals Addressed            This Visit's Progress   . Chronic Care Management        Current Barriers:  . Chronic Disease Management support and education needs related to elevated blood glucose, hypothyroidism and degenerative arthritis.  Case Manager Clinical Goal(s):  Marland Kitchen Over the next 90 days, patient will take all medications as prescribed. . Over the next 90 days, patient will attend all scheduled medical appointments. . Over the next 90 days, patient will continue adherence with recommended cardiac prudent diet. . Over the next 90 days, patient will follow recommended safety measures to prevent falls and injuries.  Interventions:  . Reviewed medications. Encouraged to take medications as prescribed. Encouraged to notify care management team with concerns regarding medication management or prescription costs. . Discussed dietary intake and lab values. Encouraged to continue compliance with recommended diet. . Discussed activity tolerance and ability to perform ADL's. Reviewed safety and fall prevention measures. Encouraged to practice caution when ambulating and utilize/request assistive device if needed. (Hx of degenerative arthritis) . Reviewed pending appointments. Encourage to attend appointments as scheduled to prevent delays in care. . Discussed plans for ongoing care management follow up. Provided direct contact information for care management team  Patient Self Care Activities:  . Self administers medications . Attends scheduled provider appointments . Calls pharmacy for medication refills . Performs ADL's independently . Performs IADL's independently   Initial goal documentation         Natalie [name] was given information about Chronic Care Management services  including:  1. CCM service includes personalized support from designated clinical staff supervised by Natalie [name],  including individualized plan of care and coordination with other care providers 2. 24/7 contact phone numbers for assistance for urgent and routine care needs. 3. Service will only be billed when office clinical staff spend 20 minutes or more in a month to coordinate care. 4. Only one practitioner may furnish and bill the service in a calendar month. 5. The patient may stop CCM services at any time (effective at the end of the month) by phone call to the office staff. 6. The patient will be responsible for cost sharing (co-pay) of up to 20% of the service fee (after annual deductible is met).  Patient agreed to services and verbal consent obtained.   Natalie [name] verbalized understanding of the instructions provided during the telephonic outreach today. Declined need for a mailed/printed copy of the instructions.   Natalie [name] reports doing very well and does not require frequent outreach for disease management. The care management team will reach out again in two months. She was encouraged to call with any urgent or health related concerns if needed.   [facility] Care Management [phone]

## 2019-05-30 DIAGNOSIS — H11823 Conjunctivochalasis, bilateral: Secondary | ICD-10-CM | POA: Diagnosis not present

## 2019-05-30 DIAGNOSIS — H04123 Dry eye syndrome of bilateral lacrimal glands: Secondary | ICD-10-CM | POA: Diagnosis not present

## 2019-05-31 NOTE — Patient Instructions (Addendum)
Thank you for allowing the Chronic Care Management team to participate in your care.  Goals Addressed            This Visit's Progress   . Chronic Care Management        Current Barriers:  . Chronic Disease Management support and education needs related to elevated blood glucose, hypothyroidism and degenerative arthritis.  Case Manager Clinical Goal(s):  Marland Kitchen Over the next 90 days, patient will take all medications as prescribed. . Over the next 90 days, patient will attend all scheduled medical appointments. . Over the next 90 days, patient will continue adherence with recommended cardiac prudent diet. . Over the next 90 days, patient will follow recommended safety measures to prevent falls and injuries.  Interventions:  . Reviewed medications. Encouraged to take medications as prescribed. Encouraged to notify care management team with concerns regarding medication management or prescription costs. Reports taking medications as prescribed. Reports recent cost increase with a few prescriptions. Declined current need for medication assistance. Encouraged to notify team for assistance is needed. . Discussed dietary intake and lab values. Encouraged to continue compliance with recommended diet. . Discussed activity tolerance and ability to perform ADL's. Reviewed safety and fall prevention measures. Encouraged to practice caution when ambulating and utilize/request assistive device if needed. (Hx of degenerative arthritis) Reports doing fairly well with most activities. Reports previously being diagnosed with Dupuytren's contracture and noticing additional "lumps" and "knots" on her right hand. Reports the growths have not impacted the function of her hand. She plans to discuss this during her next provider outreach. Reports walking in the park regularly without difficulty. Reports fatigue with prolonged activity but otherwise doing well. No recent falls. . Reviewed pending appointments. Encourage  to attend appointments as scheduled to prevent delays in care.  . Discussed plans for ongoing care management follow up. Provided direct contact information for care management team  Patient Self Care Activities:  . Self administers medications . Attends scheduled provider appointments . Calls pharmacy for medication refills . Performs ADL's independently . Performs IADL's independently   Please see past updates related to this goal by clicking on the "Past Updates" button in the selected goal          Ms. Hollan verbalized understanding of the instructions provided during the telephonic outreach today. Declined need for a printed/mailed copy of the instructions.   The care management team will follow up with Ms. Putzier next month.    [facility] [phone]

## 2019-06-03 ENCOUNTER — Telehealth: Payer: Self-pay

## 2019-06-03 ENCOUNTER — Ambulatory Visit: Payer: Self-pay

## 2019-06-03 NOTE — Chronic Care Management (AMB) (Signed)
  Chronic Care Management   Outreach Note  06/03/2019 Name: Natalie [name] MRN: [id number] DOB: 04-28-1950  Primary Care Provider: [name], MD Reason for referral : Chronic Care Management   An unsuccessful telephone outreach was attempted today. [name] is currently engaged with the chronic care management team.   A HIPAA compliant voice message was left today requesting a return call.    Follow Up Plan The care management team will reach out to [name] again next week.   [facility] [phone]

## 2019-06-10 ENCOUNTER — Ambulatory Visit (INDEPENDENT_AMBULATORY_CARE_PROVIDER_SITE_OTHER): Payer: Medicare Other

## 2019-06-10 DIAGNOSIS — E039 Hypothyroidism, unspecified: Secondary | ICD-10-CM

## 2019-06-10 DIAGNOSIS — E785 Hyperlipidemia, unspecified: Secondary | ICD-10-CM | POA: Diagnosis not present

## 2019-06-10 NOTE — Chronic Care Management (AMB) (Signed)
Chronic Care Management   Follow Up Note   06/10/2019 Name: KATIANN HUBBY MRN: [id number] DOB: 1950/06/04  Primary Care Provider: [name], MD Reason for referral : Chronic Care Management   Natalie RECENDEZ is a 69 y.o. year old female who is a primary care patient of [name], MD. She is currently engaged with the chronic care management team. A routine telephonic outreach was conducted today.  Review of Ms. Tan's status, including review of consultants reports, relevant labs and test results was conducted today. Collaboration with appropriate care team members was performed as part of the comprehensive evaluation and provision of chronic care management services.    SDOH (Social Determinants of Health) assessments performed today: No    Outpatient Encounter Medications as of 06/10/2019  Medication Sig  . buPROPion (WELLBUTRIN SR) 150 MG 12 hr tablet Take 150 mg by mouth 2 (two) times daily.  . citalopram (CELEXA) 40 MG tablet Take 40 mg by mouth daily.  . clindamycin-benzoyl peroxide (BENZACLIN) gel Apply topically 2 (two) times daily.  Marland Kitchen levothyroxine (SYNTHROID) 75 MCG tablet Take 1 tablet (75 mcg total) by mouth daily.  . Multiple Vitamins-Minerals (MULTIVITAMIN WOMEN 50+ PO) Take 1 tablet by mouth daily.  . Omega-3 Fatty Acids (FISH OIL) 1000 MG CAPS Take 1 capsule by mouth daily.  . SUMAtriptan (IMITREX) 100 MG tablet May repeat in 2 hours if headache persists or recurs.  . topiramate (TOPAMAX) 25 MG tablet Take 25 mg by mouth daily.   . Vitamin D, Ergocalciferol, (DRISDOL) 1.25 MG (50000 UNIT) CAPS capsule TAKE 1 CAPSULE BY MOUTH EVERY 7 DAYS   No facility-administered encounter medications on file as of 06/10/2019.     Objective:   BP Readings from Last 3 Encounters:  05/10/19 126/84  12/22/18 122/64  04/23/18 120/80    Lab Results  Component Value Date   CHOL 178 05/10/2019   HDL 41 (L) 05/10/2019   LDLCALC 118 (H) 05/10/2019   TRIG 86  05/10/2019   CHOLHDL 4.3 05/10/2019    Wt Readings from Last 3 Encounters:  05/10/19 235 lb 11.2 oz (106.9 kg)  12/22/18 230 lb (104.3 kg)  11/18/18 235 lb (106.6 kg)    Lab Results  Component Value Date   HGBA1C 5.2 05/10/2019      Goals Addressed            This Visit's Progress   . Chronic Care Management        Current Barriers:  . Chronic Disease Management support and education needs related to elevated blood glucose, hypothyroidism and degenerative arthritis.  Case Manager Clinical Goal(s):  Marland Kitchen Over the next 90 days, patient will take all medications as prescribed. . Over the next 90 days, patient will attend all scheduled medical appointments. . Over the next 90 days, patient will continue adherence with recommended cardiac prudent diet. . Over the next 90 days, patient will follow recommended safety measures to prevent falls and injuries.  Interventions:  . Reviewed medications. Denies concerns regarding ability to self-manage medications. Willing to start medication for urinary urgency. Denies concerns regarding prescription costs.  . Discussed dietary intake and heart healthy nutritional options. . Discussed activity tolerance and safety measures. Reports recent fall due to accidentally stepping into a hole in the yard. Reports she was not injured and did not require medical evaluation. Otherwise doing well. No difficulties ambulating. No decline in activity tolerance. . Reviewed pending medical appointments. Denies concerns regarding transportation. . Discussed plans for ongoing care  management follow up. Will follow-up regarding medication request.   Patient Self Care Activities:  . Self administers medications . Attends scheduled provider appointments . Calls pharmacy for medication refills . Performs ADL's independently . Performs IADL's independently   Please see past updates related to this goal by clicking on the "Past Updates" button in the selected  goal          PLAN -Will follow up with Dr. Ancil Boozer regarding medication request. -Will follow-up with Ms. Gianfrancesco next week.   [facility] [phone]

## 2019-06-11 ENCOUNTER — Other Ambulatory Visit: Payer: Self-pay | Admitting: Family Medicine

## 2019-06-11 MED ORDER — OXYBUTYNIN CHLORIDE ER 5 MG PO TB24: 5.0000 mg | ORAL_TABLET | Freq: Every day | ORAL | 0 refills | Status: DC

## 2019-06-13 ENCOUNTER — Ambulatory Visit: Payer: Medicare Other

## 2019-06-15 ENCOUNTER — Telehealth: Payer: Self-pay

## 2019-06-15 NOTE — Chronic Care Management (AMB) (Signed)
  Chronic Care Management   Note   Name: Natalie [name] MRN: [id number] DOB: May 04, 1950   Received confirmation from Dr. Ancil Boozer regarding medication request. Order submitted for Ditropan XL 5mg  (24hr tablet .   Follow up plan: Left voice message for Natalie [name] regarding new order for Ditropan XL 5mg .  Will outreach again within the next week.  [facility] [phone]

## 2019-06-15 NOTE — Patient Instructions (Signed)
Thank you for allowing the Chronic Care Management team to participate in your care.  Goals Addressed            This Visit's Progress   . Chronic Care Management        Current Barriers:  . Chronic Disease Management support and education needs related to elevated blood glucose, hypothyroidism and degenerative arthritis.  Case Manager Clinical Goal(s):  Marland Kitchen Over the next 90 days, patient will take all medications as prescribed. . Over the next 90 days, patient will attend all scheduled medical appointments. . Over the next 90 days, patient will continue adherence with recommended cardiac prudent diet. . Over the next 90 days, patient will follow recommended safety measures to prevent falls and injuries.  Interventions:  . Reviewed medications. Denies concerns regarding ability to self-manage medications. Willing to start medication for urinary urgency. Denies concerns regarding prescription costs.  . Discussed dietary intake and heart healthy nutritional options. . Discussed activity tolerance and safety measures. Reports recent fall due to accidentally stepping into a hole in the yard. Reports she was not injured and did not require medical evaluation. Otherwise doing well. No difficulties ambulating. No decline in activity tolerance. . Reviewed pending medical appointments. Denies concerns regarding transportation. . Discussed plans for ongoing care management follow up. Will follow-up regarding medication request.   Patient Self Care Activities:  . Self administers medications . Attends scheduled provider appointments . Calls pharmacy for medication refills . Performs ADL's independently . Performs IADL's independently   Please see past updates related to this goal by clicking on the "Past Updates" button in the selected goal          Natalie [name] verbalized understanding of the information/instructions provided during the telephonic outreach today. Declined need for a  printed/mailed copy of the instructions.    The care management team will contact Dr. Ancil Boozer regarding [name]'s medication request and follow-up next week.   [facility] [phone]

## 2019-06-20 ENCOUNTER — Ambulatory Visit: Payer: Medicare Other

## 2019-06-27 NOTE — Chronic Care Management (AMB) (Signed)
  Chronic Care Management   Note   Name: Natalie [name] MRN: [id number] DOB: 11-27-1950   Attempted follow-up to confirm receipt of new prescription. Will outreach again within the next two weeks  [facility] [phone]

## 2019-07-07 ENCOUNTER — Other Ambulatory Visit: Payer: Self-pay | Admitting: Family Medicine

## 2019-07-08 ENCOUNTER — Ambulatory Visit: Payer: Self-pay

## 2019-07-08 ENCOUNTER — Telehealth: Payer: Self-pay

## 2019-07-08 NOTE — Chronic Care Management (AMB) (Signed)
  Chronic Care Management   Outreach Note  07/08/2019 Name: [name] MRN: [id number] DOB: [birth year]    Primary Care Provider: [name], MD Reason for referral : Chronic Care Management    An unsuccessful telephone outreach was attempted today. Natalie [name] is currently engaged with the chronic care management team.    Follow Up Plan: The care management team will reach out again within the next two to three weeks.   [facility] [phone]

## 2019-07-22 ENCOUNTER — Telehealth: Payer: Self-pay

## 2019-07-29 ENCOUNTER — Telehealth: Payer: Self-pay

## 2019-08-09 ENCOUNTER — Other Ambulatory Visit: Payer: Self-pay | Admitting: Family Medicine

## 2019-08-31 ENCOUNTER — Telehealth: Payer: Self-pay

## 2019-09-01 ENCOUNTER — Other Ambulatory Visit: Payer: Self-pay | Admitting: Family Medicine

## 2019-09-01 ENCOUNTER — Encounter: Payer: Self-pay | Admitting: Family Medicine

## 2019-09-01 ENCOUNTER — Telehealth (INDEPENDENT_AMBULATORY_CARE_PROVIDER_SITE_OTHER): Payer: Medicare Other | Admitting: Family Medicine

## 2019-09-01 ENCOUNTER — Other Ambulatory Visit: Payer: Self-pay

## 2019-09-01 DIAGNOSIS — E038 Other specified hypothyroidism: Secondary | ICD-10-CM

## 2019-09-01 DIAGNOSIS — J40 Bronchitis, not specified as acute or chronic: Secondary | ICD-10-CM | POA: Diagnosis not present

## 2019-09-01 DIAGNOSIS — M720 Palmar fascial fibromatosis [Dupuytren]: Secondary | ICD-10-CM | POA: Diagnosis not present

## 2019-09-01 MED ORDER — TRELEGY ELLIPTA 100-62.5-25 MCG/INH IN AEPB: 1.0000 | INHALATION_SPRAY | Freq: Every day | RESPIRATORY_TRACT | 0 refills | Status: DC

## 2019-09-01 MED ORDER — BENZONATATE 100 MG PO CAPS: 100.0000 mg | ORAL_CAPSULE | Freq: Two times a day (BID) | ORAL | 1 refills | Status: DC | PRN

## 2019-09-01 NOTE — Progress Notes (Addendum)
Name: Natalie [name]   MRN: [id number]    DOB: 1950/02/13   Date:09/01/2019       Progress Note  Subjective  Chief Complaint  Chief Complaint  Patient presents with  . Cough    x 1 year that comes and goes and has been worse for more than a month. She usually gets a cough in the spring and fall. Cough has been back since June. Chest hurts on and off. She has been using cough drops and honey. Cough is dry.    I connected with  [name] on 09/01/19 at  7:40 AM EDT by telephone and verified that I am speaking with the correct person using two identifiers.  I discussed the limitations, risks, security and privacy concerns of performing an evaluation and management service by telephone and the availability of in person appointments. Staff also discussed with the patient that there may be a patient responsible charge related to this service. Patient Location: at home  Provider Location: [facility]   HPI  Dry Cough: she states she has a recurrent cough , but this episode started about 2 months ago, worse over the past month. She states first episode was back in 1987 after she went inside a jail to interview an inmate. She was diagnosed with bronchitis and since than she has at least one episode of bronchitis , usually goes away after 2-3 months sometimes without treatment She states this episode not associated with any cold symptoms. She states much worse this past week when she was in [place] on vacation, she returned this week from there. She denies previous history of asthma, she used to smoke but quit in 1976 - she only smoked for about 6 years less than a pack a day on average. She denies weight loss, no fever or chills. She denies GERD symptoms   Dupuytren's contracture: on both hands, per patient getting more painful and progressively worse, she would like a referral to Ortho   Patient Active Problem List   Diagnosis Date Noted  . HPV test positive 07/08/2015  . Elevated  blood sugar 06/28/2015  . Difficulty hearing 06/28/2015  . Arthritis, degenerative 06/28/2015  . Obesity, Class I, BMI 30-34.9 06/28/2015  . Paresthesia 06/28/2015  . Purpura, nonthrombopenic ([place]) 06/28/2015  . Skin lesion of right leg 06/28/2015  . Other specified hypothyroidism 08/09/2014  . Migraine without aura and without status migrainosus, not intractable 09/19/2008  . Moderate major depression ([place]) 09/07/2006  . OP (osteoporosis) 09/07/2006    Past Surgical History:  Procedure Laterality Date  . ABLATION  2006  . CATARACT EXTRACTION W/ INTRAOCULAR LENS IMPLANT Bilateral   . COLONOSCOPY    . COLONOSCOPY WITH PROPOFOL N/A 12/22/2018   Procedure: COLONOSCOPY WITH PROPOFOL;  Surgeon: Virgel Manifold, MD;  Location: [facility] ENDOSCOPY;  Service: Endoscopy;  Laterality: N/A;  . TONSILLECTOMY      Family History  Problem Relation Age of Onset  . Early death Father        suicide  . Depression Father   . Diabetes Father   . Cancer Father        prostate  . Hearing loss Father        due to war  . Alzheimer's disease Mother   . Heart disease Brother   . Heart disease Maternal Aunt   . Dementia Maternal Aunt   . Heart disease Maternal Uncle   . Dementia Maternal Grandmother   . Heart disease Brother  Current Outpatient Medications:  .  buPROPion (WELLBUTRIN SR) 150 MG 12 hr tablet, Take 150 mg by mouth 2 (two) times daily., Disp: , Rfl:  .  citalopram (CELEXA) 40 MG tablet, Take 40 mg by mouth daily., Disp: , Rfl:  .  clindamycin-benzoyl peroxide (BENZACLIN) gel, Apply topically 2 (two) times daily., Disp: 50 g, Rfl: 0 .  levothyroxine (SYNTHROID) 75 MCG tablet, TAKE 1 TABLET(75 MCG) BY MOUTH DAILY, Disp: 90 tablet, Rfl: 0 .  Multiple Vitamins-Minerals (MULTIVITAMIN WOMEN 50+ PO), Take 1 tablet by mouth daily., Disp: , Rfl:  .  Omega-3 Fatty Acids (FISH OIL) 1000 MG CAPS, Take 1 capsule by mouth daily., Disp: , Rfl:  .  oxybutynin (DITROPAN-XL) 5 MG 24 hr  tablet, TAKE 1 TABLET(5 MG) BY MOUTH AT BEDTIME, Disp: 30 tablet, Rfl: 0 .  SUMAtriptan (IMITREX) 100 MG tablet, May repeat in 2 hours if headache persists or recurs., Disp: 10 tablet, Rfl: 0 .  topiramate (TOPAMAX) 25 MG tablet, Take 25 mg by mouth daily. , Disp: , Rfl:  .  Vitamin D, Ergocalciferol, (DRISDOL) 1.25 MG (50000 UNIT) CAPS capsule, TAKE 1 CAPSULE BY MOUTH EVERY 7 DAYS, Disp: 12 capsule, Rfl: 1  Allergies  Allergen Reactions  . Sulfamethoxazole-Trimethoprim Hives    I personally reviewed active problem list, medication list, allergies, family history, social history, health maintenance with the patient/caregiver today.   ROS  Ten systems reviewed and is negative except as mentioned in HPI   Objective  Virtual encounter, vitals not obtained.  There is no height or weight on file to calculate BMI.  Physical Exam  Awake, alert and oriented   PHQ2/9: Depression screen [facility] 2/9 09/01/2019 06/10/2019 05/10/2019 04/04/2019 11/18/2018  Decreased Interest 0 1 1 1 2   Down, Depressed, Hopeless 0 1 1 0 2  PHQ - 2 Score 0 2 2 1 4   Altered sleeping 0 0 0 - 3  Tired, decreased energy 0 1 1 - 3  Change in appetite 0 0 1 - 2  Feeling bad or failure about yourself  0 0 0 - 1  Trouble concentrating 0 0 0 - 2  Moving slowly or fidgety/restless 0 0 0 - 2  Suicidal thoughts 0 0 0 - 0  PHQ-9 Score 0 3 4 - 17  Difficult doing work/chores - - Somewhat difficult - Somewhat difficult  Some recent data might be hidden   PHQ-2/9 Result is negative.    Fall Risk: Fall Risk  09/01/2019 05/10/2019 04/04/2019 01/07/2019 11/18/2018  Falls in the past year? 0 0 0 1 1  Number falls in past yr: 0 0 - 1 1  Injury with Fall? 0 0 - 0 0  Comment - - - - -  Risk for fall due to : - - Medication side effect Impaired balance/gait Impaired balance/gait  Risk for fall due to: Comment - - - - -  Follow up - - Falls prevention discussed Falls prevention discussed Falls prevention discussed     Assessment &  Plan  1. Bronchitis  - Fluticasone-Umeclidin-Vilant (TRELEGY ELLIPTA) 100-62.5-25 MCG/INH AEPB; Inhale 1 puff into the lungs daily.  Dispense: 60 each; Refill: 0 - benzonatate (TESSALON) 100 MG capsule; Take 1-2 capsules (100-200 mg total) by mouth 2 (two) times daily as needed.  Dispense: 30 capsule; Refill: 1  If no improvement we will call in antibiotics - Zpack, prednisone for 5 days and get a CXR   2. Dupuytren's contracture of hand  - Ambulatory referral to Orthopedic  Surgery  I discussed the assessment and treatment plan with the patient. The patient was provided an opportunity to ask questions and all were answered. The patient agreed with the plan and demonstrated an understanding of the instructions.   The patient was advised to call back or seek an in-person evaluation if the symptoms worsen or if the condition fails to improve as anticipated.  I provided 15 minutes of non-face-to-face time during this encounter.  Loistine Chance, MD

## 2019-09-01 NOTE — Addendum Note (Signed)
Addended by: Loistine Chance on: 09/01/2019 08:14 AM   Modules accepted: Orders, Level of Service

## 2019-09-02 ENCOUNTER — Telehealth: Payer: Self-pay

## 2019-09-02 ENCOUNTER — Other Ambulatory Visit: Payer: Self-pay | Admitting: Family Medicine

## 2019-09-02 DIAGNOSIS — J40 Bronchitis, not specified as acute or chronic: Secondary | ICD-10-CM

## 2019-09-02 MED ORDER — BENZONATATE 100 MG PO CAPS: 100.0000 mg | ORAL_CAPSULE | Freq: Two times a day (BID) | ORAL | 1 refills | Status: DC | PRN

## 2019-09-02 NOTE — Telephone Encounter (Signed)
Spoke with Rep on 09-01-2019 she faxed over form for Dr to sign and fax back to company and the samples will be shipped to office hopefully by middle of next week. Done

## 2019-09-02 NOTE — Telephone Encounter (Signed)
Copied from [place] [id number]. Topic: Quick Communication - Rx Refill/Question >> Sep 02, 2019  1:59 PM Erick Blinks wrote: Fluticasone-Umeclidin-Vilant (TRELEGY ELLIPTA) 100-62.5-25 MCG/INH AEPB pt called to report that she cannot afford this copay  Best contact: [phone]   I called patient and told her that I put a coupon up front for her to take to pharmacy.

## 2019-09-02 NOTE — Telephone Encounter (Unsigned)
Copied from [place] [id number]. Topic: Quick Communication - Rx Refill/Question >> Sep 02, 2019  1:59 PM Erick Blinks wrote: Fluticasone-Umeclidin-Vilant (TRELEGY ELLIPTA) 100-62.5-25 MCG/INH AEPB pt called to report that she cannot afford this copay  Best contact: [phone]

## 2019-09-02 NOTE — Telephone Encounter (Signed)
Pt called requesting that we reroute her benzonatate (TESSALON) 100 MG capsule  To  [place], [address]  [place] [address]  Phone: [phone] Fax: [phone]   Instead of [place], please advise

## 2019-09-05 DIAGNOSIS — F330 Major depressive disorder, recurrent, mild: Secondary | ICD-10-CM | POA: Diagnosis not present

## 2019-09-07 ENCOUNTER — Telehealth: Payer: Self-pay

## 2019-09-07 NOTE — Telephone Encounter (Signed)
  Chronic Care Management   Outreach Note  09/07/2019 Name: Natalie [name] MRN: [id number] DOB: 1950/03/16   Primary Care Provider: [name], MD Reason for referral : Chronic Care Management   An unsuccessful outreach was attempted today. A HIPAA compliant voice message was left for [name] requesting a return call.   PLAN: The care management team will attempt to reach [name] later this month.    [facility] [phone]

## 2019-09-08 ENCOUNTER — Other Ambulatory Visit: Payer: Self-pay | Admitting: Family Medicine

## 2019-09-17 ENCOUNTER — Other Ambulatory Visit: Payer: Self-pay | Admitting: Family Medicine

## 2019-09-17 DIAGNOSIS — E559 Vitamin D deficiency, unspecified: Secondary | ICD-10-CM

## 2019-09-17 NOTE — Telephone Encounter (Signed)
Requested medication (s) are due for refill today: yes  Requested medication (s) are on the active medication list: yes  Last refill:  04/11/19  Future visit scheduled: yes  Notes to clinic:  med not delegated to NT to RF   Requested Prescriptions  Pending Prescriptions Disp Refills   Vitamin D, Ergocalciferol, (DRISDOL) 1.25 MG (50000 UNIT) CAPS capsule [Pharmacy Med Name: VITAMIN D2 50,000IU (ERGO) CAP RX] 12 capsule 1    Sig: TAKE 1 CAPSULE BY MOUTH EVERY 7 DAYS      Endocrinology:  Vitamins - Vitamin D Supplementation Failed - 09/17/2019 11:48 AM      Failed - 50,000 IU strengths are not delegated      Failed - Phosphate in normal range and within 360 days    No results found for: PHOS        Passed - Ca in normal range and within 360 days    Calcium  Date Value Ref Range Status  05/10/2019 9.1 8.6 - 10.4 mg/dL Final          Passed - Vitamin D in normal range and within 360 days    Vit D, 25-Hydroxy  Date Value Ref Range Status  05/10/2019 73 30 - 100 ng/mL Final    Comment:    Vitamin D Status         25-OH Vitamin D: . Deficiency:                    <20 ng/mL Insufficiency:             20 - 29 ng/mL Optimal:                 > or = 30 ng/mL . For 25-OH Vitamin D testing on patients on  D2-supplementation and patients for whom quantitation  of D2 and D3 fractions is required, the [employer]ure[employer](TM) 25-OH VIT D, (D2,D3), LC/MS/MS is recommended: order  code [id number] (patients >67yrs). See Note 1 . Note 1 . For additional information, please refer to  http://education.Quest[facility].com/faq/FAQ199  (This link is being provided for informational/ educational purposes only.)           Passed - Valid encounter within last 12 months    Recent Outpatient Visits           2 weeks ago [facility] [place], Drue Stager, MD   4 months ago Major depression, recurrent, chronic [facility])   [facility] [place], Drue Stager, MD    11 months ago Migraine without aura and with status migrainosus, not intractable   [facility] [name], MD   1 year ago Moderate major depression [facility])   [facility] [name], MD   1 year ago Ocular migraine   [facility] [name], MD       Future Appointments             In 1 month Ancil Boozer, Drue Stager, MD [facility], [place]   In 2 months  [facility], [facility]

## 2019-09-19 ENCOUNTER — Other Ambulatory Visit: Payer: Self-pay

## 2019-09-20 ENCOUNTER — Other Ambulatory Visit: Payer: Self-pay

## 2019-09-20 DIAGNOSIS — E559 Vitamin D deficiency, unspecified: Secondary | ICD-10-CM

## 2019-09-20 MED ORDER — VITAMIN D (ERGOCALCIFEROL) 1.25 MG (50000 UNIT) PO CAPS: ORAL_CAPSULE | ORAL | 1 refills | Status: DC

## 2019-09-22 ENCOUNTER — Ambulatory Visit: Payer: Self-pay

## 2019-09-22 NOTE — Chronic Care Management (AMB) (Signed)
  Chronic Care Management   Outreach Note  09/22/2019 Name: Natalie [name] MRN: [id number] DOB: Dec 20, 1950  Primary Care Provider: [name], MD Reason for referral : Chronic Care Management    [name] was referred to the care management team for assistance with chronic care management and care coordination. She was previously enrolled in the care management program. Her primary care provider will be notified of our unsuccessful attempts to maintain contact. The care management team will gladly outreach at any time in the future if she is interested in receiving assistance.   PLAN The care management team will gladly follow up with Ms. Base after the primary care provider has a conversation with her regarding recommendation for care management engagement and subsequent re-referral for care management services.      [facility] [phone]

## 2019-10-13 ENCOUNTER — Other Ambulatory Visit: Payer: Self-pay | Admitting: Family Medicine

## 2019-10-13 ENCOUNTER — Encounter: Payer: Self-pay | Admitting: Family Medicine

## 2019-10-13 DIAGNOSIS — J40 Bronchitis, not specified as acute or chronic: Secondary | ICD-10-CM

## 2019-10-13 MED ORDER — BENZONATATE 100 MG PO CAPS: 100.0000 mg | ORAL_CAPSULE | Freq: Two times a day (BID) | ORAL | 1 refills | Status: DC | PRN

## 2019-10-14 ENCOUNTER — Other Ambulatory Visit: Payer: Self-pay | Admitting: Family Medicine

## 2019-10-14 DIAGNOSIS — E669 Obesity, unspecified: Secondary | ICD-10-CM | POA: Diagnosis not present

## 2019-10-14 DIAGNOSIS — R609 Edema, unspecified: Secondary | ICD-10-CM | POA: Diagnosis not present

## 2019-10-14 DIAGNOSIS — E079 Disorder of thyroid, unspecified: Secondary | ICD-10-CM | POA: Diagnosis not present

## 2019-10-14 DIAGNOSIS — N3941 Urge incontinence: Secondary | ICD-10-CM

## 2019-10-14 DIAGNOSIS — E785 Hyperlipidemia, unspecified: Secondary | ICD-10-CM | POA: Diagnosis not present

## 2019-10-14 DIAGNOSIS — Z9189 Other specified personal risk factors, not elsewhere classified: Secondary | ICD-10-CM | POA: Diagnosis not present

## 2019-10-14 DIAGNOSIS — Z87898 Personal history of other specified conditions: Secondary | ICD-10-CM | POA: Diagnosis not present

## 2019-10-14 DIAGNOSIS — I10 Essential (primary) hypertension: Secondary | ICD-10-CM | POA: Diagnosis not present

## 2019-10-14 MED ORDER — TOLTERODINE TARTRATE ER 2 MG PO CP24: 2.0000 mg | ORAL_CAPSULE | Freq: Every day | ORAL | 0 refills | Status: DC

## 2019-10-15 ENCOUNTER — Other Ambulatory Visit: Payer: Self-pay | Admitting: Family Medicine

## 2019-10-17 ENCOUNTER — Other Ambulatory Visit: Payer: Self-pay | Admitting: Family Medicine

## 2019-10-17 DIAGNOSIS — N3941 Urge incontinence: Secondary | ICD-10-CM

## 2019-10-17 MED ORDER — TOLTERODINE TARTRATE ER 2 MG PO CP24: 2.0000 mg | ORAL_CAPSULE | Freq: Every day | ORAL | 2 refills | Status: DC

## 2019-10-18 ENCOUNTER — Other Ambulatory Visit: Payer: Self-pay | Admitting: Family Medicine

## 2019-10-18 DIAGNOSIS — J40 Bronchitis, not specified as acute or chronic: Secondary | ICD-10-CM

## 2019-10-25 DIAGNOSIS — M720 Palmar fascial fibromatosis [Dupuytren]: Secondary | ICD-10-CM | POA: Diagnosis not present

## 2019-11-07 NOTE — Progress Notes (Signed)
Name: Natalie [name]   MRN: [id number]    DOB: 06-11-1950   Date:11/09/2019       Progress Note  Subjective  Chief Complaint  Chief Complaint  Patient presents with  . Depression  . Hypothyroidism  . Migraine  . Medication Refill  . Headache  . Jaw Pain    HPI  Osteoporosis: on right femur, she is fair skin, family history of osteoporosis. She would like to hold off on all medications at this time.She has increased calcium in her diet.She has been taking vitamin D rx. , she will contact [facility] to schedule it   Urge Incontinence: she drinks 8 glassed of water daily, she has urinary frequency and urgency for years. Discussed therapy but she wants to hold off for now   Dupuytren's contracture : she was seen at [facility] by Dr. Jackqulyn Livings and was given reassurance as long as the palm of her hand can lay flat on a surface but to go back for re-evaluation if unable to do so.   B12 deficiency:last B12 wasat goal. She is only on MVI, advised to try SL B12 once a week since levels are trending down   Hypothyroidism: she has been taking medication as prescribed, she states hair is not falling as much, she hasintermittentdry skin, no constipation . Last TSH was at goal   Dyslipidemia: she does not need statin therapy   The 10-year ASCVD risk score Mikey Bussing DC Brooke Bonito., et al., 2013) is: 7.8%   Values used to calculate the score:     Age: 41 years     Sex: Female     Is Non-Hispanic African American: No     Diabetic: No     Tobacco smoker: No     Systolic Blood Pressure: 093 mmHg     Is BP treated: No     HDL Cholesterol: 41 mg/dL     Total Cholesterol: 178 mg/dL  OA: takingTylenol prn , off meloxicam but has been taking aspirin prn for aches and pain, she states does not take medications very often.  Moderate Depression: still sees Dr. Thurmond Butts, states mood has been stable, compliant with medication,she states never in remission, not seeing therapist,  she denies suicidal thoughts or ideation.Phq9 isokay, she states she is bored, worries about ADD, she states no motivation and her house has not been cleaned for years.  Advised to see a therapist again   Migraine headaches: she is on Topamax she states that episodes are on average1in a month but episodes can last a few days. Pain is described as right temporal or parietal area, piercing like and at times aching but other times feels like a band on top of her head. She states Imitrex resolves symptomsmost of the times ,occasionally has to take a second dose the following morning.Sometimes takes aspirin for headache     Morbid obesity: BMI above 35 with co-morbidity dyslipidemia and osteoarthritis. Discussed importance of weight loss.She was going for dog walks but it got too hot , she has another dog now and is not sure how to walk both at the same time   Dry cough: going on for a long time, it started months ago, usually resolves after season change and is still present. She used to be a smoker but quit in 1976. Discussed CXR, CT scan lung, referral to pulmonologist but she states finally doing well Started in May and finally resolved   Patient Active Problem List   Diagnosis Date Noted  .  HPV test positive 07/08/2015  . Elevated blood sugar 06/28/2015  . Difficulty hearing 06/28/2015  . Arthritis, degenerative 06/28/2015  . Obesity, Class I, BMI 30-34.9 06/28/2015  . Paresthesia 06/28/2015  . Purpura, nonthrombopenic ([place]) 06/28/2015  . Skin lesion of right leg 06/28/2015  . Other specified hypothyroidism 08/09/2014  . Migraine without aura and without status migrainosus, not intractable 09/19/2008  . Moderate major depression ([place]) 09/07/2006  . OP (osteoporosis) 09/07/2006    Past Surgical History:  Procedure Laterality Date  . ABLATION  2006  . CATARACT EXTRACTION W/ INTRAOCULAR LENS IMPLANT Bilateral   . COLONOSCOPY    . COLONOSCOPY WITH PROPOFOL N/A 12/22/2018    Procedure: COLONOSCOPY WITH PROPOFOL;  Surgeon: Virgel Manifold, MD;  Location: [facility] ENDOSCOPY;  Service: Endoscopy;  Laterality: N/A;  . TONSILLECTOMY      Family History  Problem Relation Age of Onset  . Early death Father        suicide  . Depression Father   . Diabetes Father   . Cancer Father        prostate  . Hearing loss Father        due to war  . Alzheimer's disease Mother   . Heart disease Brother   . Heart disease Maternal Aunt   . Dementia Maternal Aunt   . Heart disease Maternal Uncle   . Dementia Maternal Grandmother   . Heart disease Brother     Social History   Tobacco Use  . Smoking status: Former Smoker    Packs/day: 1.00    Years: 6.00    Pack years: 6.00    Types: Cigarettes    Quit date: 02/11/1974    Years since quitting: 45.7  . Smokeless tobacco: Never Used  Substance Use Topics  . Alcohol use: Yes    Alcohol/week: 0.0 standard drinks    Comment: very seldom; socially     Current Outpatient Medications:  .  benzonatate (TESSALON) 100 MG capsule, Take 1-2 capsules (100-200 mg total) by mouth 2 (two) times daily as needed., Disp: 60 capsule, Rfl: 1 .  buPROPion (WELLBUTRIN SR) 150 MG 12 hr tablet, Take 150 mg by mouth 2 (two) times daily., Disp: , Rfl:  .  citalopram (CELEXA) 40 MG tablet, Take 40 mg by mouth daily., Disp: , Rfl:  .  clindamycin-benzoyl peroxide (BENZACLIN) gel, Apply topically 2 (two) times daily., Disp: 50 g, Rfl: 0 .  Fluticasone-Umeclidin-Vilant (TRELEGY ELLIPTA) 100-62.5-25 MCG/INH AEPB, Inhale 1 puff into the lungs daily., Disp: 60 each, Rfl: 0 .  levothyroxine (SYNTHROID) 75 MCG tablet, TAKE 1 TABLET(75 MCG) BY MOUTH DAILY, Disp: 90 tablet, Rfl: 0 .  Multiple Vitamins-Minerals (MULTIVITAMIN WOMEN 50+ PO), Take 1 tablet by mouth daily., Disp: , Rfl:  .  Omega-3 Fatty Acids (FISH OIL) 1000 MG CAPS, Take 1 capsule by mouth daily., Disp: , Rfl:  .  SUMAtriptan (IMITREX) 100 MG tablet, May repeat in 2 hours if headache  persists or recurs., Disp: 10 tablet, Rfl: 0 .  tolterodine (DETROL LA) 2 MG 24 hr capsule, Take 1 capsule (2 mg total) by mouth daily., Disp: 30 capsule, Rfl: 2 .  topiramate (TOPAMAX) 25 MG tablet, Take 25 mg by mouth daily. , Disp: , Rfl:  .  Vitamin D, Ergocalciferol, (DRISDOL) 1.25 MG (50000 UNIT) CAPS capsule, TAKE 1 CAPSULE BY MOUTH EVERY 7 DAYS, Disp: 12 capsule, Rfl: 1  Allergies  Allergen Reactions  . Sulfamethoxazole-Trimethoprim Hives    I personally reviewed active problem list,  medication list, allergies, family history, social history, health maintenance with the patient/caregiver today.   ROS  Constitutional: Negative for fever or weight change.  Respiratory: Negative for cough and shortness of breath.   Cardiovascular: Negative for chest pain or palpitations.  Gastrointestinal: Negative for abdominal pain, no bowel changes.  Musculoskeletal: Negative for gait problem or joint swelling.  Skin: Negative for rash.  Neurological: Negative for dizziness or headache.  No other specific complaints in a complete review of systems (except as listed in HPI above).  Objective  Vitals:   11/09/19 1058  BP: 120/80  Pulse: 84  Resp: 16  Temp: 98.6 F (37 C)  TempSrc: Oral  SpO2: 98%  Weight: 236 lb (107 kg)  Height: 5\' 6"  (1.676 m)    Body mass index is 38.09 kg/m.  Physical Exam  Constitutional: Patient appears well-developed and well-nourished. Obese No distress.  HEENT: head atraumatic, normocephalic, pupils equal and reactive to light, neck supple Cardiovascular: Normal rate, regular rhythm and normal heart sounds.  No murmur heard. No BLE edema. Pulmonary/Chest: Effort normal and breath sounds normal. No respiratory distress. Abdominal: Soft.  There is no tenderness. Psychiatric: Patient has a normal mood and affect. behavior is normal. Judgment and thought content normal.  PHQ2/9: Depression screen [facility] 2/9 11/09/2019 09/01/2019 06/10/2019 05/10/2019 04/04/2019   Decreased Interest 0 0 1 1 1   Down, Depressed, Hopeless 0 0 1 1 0  PHQ - 2 Score 0 0 2 2 1   Altered sleeping - 0 0 0 -  Tired, decreased energy - 0 1 1 -  Change in appetite - 0 0 1 -  Feeling bad or failure about yourself  - 0 0 0 -  Trouble concentrating - 0 0 0 -  Moving slowly or fidgety/restless - 0 0 0 -  Suicidal thoughts - 0 0 0 -  PHQ-9 Score - 0 3 4 -  Difficult doing work/chores - - - Somewhat difficult -  Some recent data might be hidden    phq 9 is negative   Fall Risk: Fall Risk  11/09/2019 09/01/2019 05/10/2019 04/04/2019 01/07/2019  Falls in the past year? 1 0 0 0 1  Number falls in past yr: 1 0 0 - 1  Injury with Fall? 0 0 0 - 0  Comment - - - - -  Risk for fall due to : - - - Medication side effect Impaired balance/gait  Risk for fall due to: Comment - - - - -  Follow up - - - Falls prevention discussed Falls prevention discussed     Functional Status Survey: Is the patient deaf or have difficulty hearing?: Yes Does the patient have difficulty seeing, even when wearing glasses/contacts?: No Does the patient have difficulty concentrating, remembering, or making decisions?: No Does the patient have difficulty walking or climbing stairs?: No Does the patient have difficulty dressing or bathing?: No Does the patient have difficulty doing errands alone such as visiting a doctor's office or shopping?: No   Assessment & Plan  1. Migraine without aura and with status migrainosus, not intractable  - SUMAtriptan (IMITREX) 100 MG tablet; May repeat in 2 hours if headache persists or recurs.  Dispense: 10 tablet; Refill: 0  2. Urge incontinence  - tolterodine (DETROL LA) 2 MG 24 hr capsule; Take 1 capsule (2 mg total) by mouth daily.  Dispense: 30 capsule; Refill: 5  3. Adult hypothyroidism  - levothyroxine (SYNTHROID) 75 MCG tablet; Take 1 tablet (75 mcg total) by mouth  daily before breakfast.  Dispense: 90 tablet; Refill: 1  4. Morbid obesity ([place])  Discussed  with the patient the risk posed by an increased BMI. Discussed importance of portion control, calorie counting and at least 150 minutes of physical activity weekly. Avoid sweet beverages and drink more water. Eat at least 6 servings of fruit and vegetables daily   5. Major depression, recurrent, chronic ([place])  Under the care of psychiatrist   6. Dupuytren's contracture of hand  Seen by Ortho   7. Dyslipidemia   8. Vitamin D deficiency  Taking rx vitamin D   9. Age-related osteoporosis without current pathological fracture  She is thinking about getting it done again   10. Primary osteoarthritis involving multiple joints   11. Adult acne  Stable

## 2019-11-08 ENCOUNTER — Encounter: Payer: Self-pay | Admitting: Family Medicine

## 2019-11-09 ENCOUNTER — Other Ambulatory Visit: Payer: Self-pay

## 2019-11-09 ENCOUNTER — Ambulatory Visit (INDEPENDENT_AMBULATORY_CARE_PROVIDER_SITE_OTHER): Payer: Medicare Other | Admitting: Family Medicine

## 2019-11-09 ENCOUNTER — Telehealth: Payer: Self-pay

## 2019-11-09 ENCOUNTER — Encounter: Payer: Self-pay | Admitting: Family Medicine

## 2019-11-09 VITALS — BP 120/80 | HR 84 | Temp 98.6°F | Resp 16 | Ht 66.0 in | Wt 236.0 lb

## 2019-11-09 DIAGNOSIS — G43001 Migraine without aura, not intractable, with status migrainosus: Secondary | ICD-10-CM

## 2019-11-09 DIAGNOSIS — F339 Major depressive disorder, recurrent, unspecified: Secondary | ICD-10-CM

## 2019-11-09 DIAGNOSIS — M8949 Other hypertrophic osteoarthropathy, multiple sites: Secondary | ICD-10-CM

## 2019-11-09 DIAGNOSIS — M810 Age-related osteoporosis without current pathological fracture: Secondary | ICD-10-CM | POA: Diagnosis not present

## 2019-11-09 DIAGNOSIS — E039 Hypothyroidism, unspecified: Secondary | ICD-10-CM | POA: Diagnosis not present

## 2019-11-09 DIAGNOSIS — L709 Acne, unspecified: Secondary | ICD-10-CM

## 2019-11-09 DIAGNOSIS — N3941 Urge incontinence: Secondary | ICD-10-CM | POA: Diagnosis not present

## 2019-11-09 DIAGNOSIS — Z23 Encounter for immunization: Secondary | ICD-10-CM

## 2019-11-09 DIAGNOSIS — E559 Vitamin D deficiency, unspecified: Secondary | ICD-10-CM | POA: Diagnosis not present

## 2019-11-09 DIAGNOSIS — M159 Polyosteoarthritis, unspecified: Secondary | ICD-10-CM

## 2019-11-09 DIAGNOSIS — M720 Palmar fascial fibromatosis [Dupuytren]: Secondary | ICD-10-CM | POA: Diagnosis not present

## 2019-11-09 DIAGNOSIS — E785 Hyperlipidemia, unspecified: Secondary | ICD-10-CM

## 2019-11-09 MED ORDER — TOLTERODINE TARTRATE ER 2 MG PO CP24: 2.0000 mg | ORAL_CAPSULE | Freq: Every day | ORAL | 5 refills | Status: DC

## 2019-11-09 MED ORDER — LEVOTHYROXINE SODIUM 75 MCG PO TABS: 75.0000 ug | ORAL_TABLET | Freq: Every day | ORAL | 1 refills | Status: DC

## 2019-11-09 MED ORDER — SUMATRIPTAN SUCCINATE 100 MG PO TABS: ORAL_TABLET | ORAL | 0 refills | Status: DC

## 2019-11-09 NOTE — Telephone Encounter (Signed)
Pt is requesting that the Detrol be sent to [facility]

## 2019-11-24 ENCOUNTER — Ambulatory Visit: Payer: Medicare Other

## 2019-11-28 DIAGNOSIS — Z23 Encounter for immunization: Secondary | ICD-10-CM | POA: Diagnosis not present

## 2019-12-07 ENCOUNTER — Telehealth: Payer: Self-pay | Admitting: Family Medicine

## 2019-12-07 NOTE — Telephone Encounter (Signed)
Copied from [place] [id number]. Topic: Medicare AWV >> Dec 07, 2019  1:28 PM Cher Nakai R wrote: Reason for CRM:   Left message for patient to call back and schedule the Medicare Annual Wellness Visit (AWV) in office or virtual  Last AWV 11/18/2018  Please schedule at anytime with [facility].  40 minute appointment  Any questions, please contact me at [phone]

## 2019-12-20 ENCOUNTER — Ambulatory Visit (INDEPENDENT_AMBULATORY_CARE_PROVIDER_SITE_OTHER): Payer: Medicare Other

## 2019-12-20 DIAGNOSIS — Z1231 Encounter for screening mammogram for malignant neoplasm of breast: Secondary | ICD-10-CM

## 2019-12-20 DIAGNOSIS — Z0000 Encounter for general adult medical examination without abnormal findings: Secondary | ICD-10-CM | POA: Diagnosis not present

## 2019-12-20 DIAGNOSIS — Z780 Asymptomatic menopausal state: Secondary | ICD-10-CM | POA: Diagnosis not present

## 2019-12-20 NOTE — Patient Instructions (Signed)
Natalie [name] , Thank you for taking time to come for your Medicare Wellness Visit. I appreciate your ongoing commitment to your health goals. Please review the following plan we discussed and let me know if I can assist you in the future.   Screening recommendations/referrals: Colonoscopy: done 12/22/18. Repeat in 2023 Mammogram: done 07/24/15. Please call [phone] to schedule your mammogram and bone density screening Bone Density: done 07/24/15 Recommended yearly ophthalmology/optometry visit for glaucoma screening and checkup Recommended yearly dental visit for hygiene and checkup  Vaccinations: Influenza vaccine: done 11/09/19 Pneumococcal vaccine: done 11/03/16 Tdap vaccine: done 12/20/12 Shingles vaccine: Shingrix discussed. Please contact your pharmacy for coverage information.  Covid-19: done 03/15/19, 04/05/19 & 11/28/19  Advanced directives: Please bring a copy of your health care power of attorney and living will to the office at your convenience.  Conditions/risks identified: Recommend increasing physical activity to at least days per week   Next appointment: Follow up in one year for your annual wellness visit    Preventive Care 65 Years and Older, Female Preventive care refers to lifestyle choices and visits with your health care provider that can promote health and wellness. What does preventive care include?  A yearly physical exam. This is also called an annual well check.  Dental exams once or twice a year.  Routine eye exams. Ask your health care provider how often you should have your eyes checked.  Personal lifestyle choices, including:  Daily care of your teeth and gums.  Regular physical activity.  Eating a healthy diet.  Avoiding tobacco and drug use.  Limiting alcohol use.  Practicing safe sex.  Taking low-dose aspirin every day.  Taking vitamin and mineral supplements as recommended by your health care provider. What happens during an annual well  check? The services and screenings done by your health care provider during your annual well check will depend on your age, overall health, lifestyle risk factors, and family history of disease. Counseling  Your health care provider may ask you questions about your:  Alcohol use.  Tobacco use.  Drug use.  Emotional well-being.  Home and relationship well-being.  Sexual activity.  Eating habits.  History of falls.  Memory and ability to understand (cognition).  Work and work Statistician.  Reproductive health. Screening  You may have the following tests or measurements:  Height, weight, and BMI.  Blood pressure.  Lipid and cholesterol levels. These may be checked every 5 years, or more frequently if you are over 26 years old.  Skin check.  Lung cancer screening. You may have this screening every year starting at age 75 if you have a 30-pack-year history of smoking and currently smoke or have quit within the past 15 years.  Fecal occult blood test (FOBT) of the stool. You may have this test every year starting at age 70.  Flexible sigmoidoscopy or colonoscopy. You may have a sigmoidoscopy every 5 years or a colonoscopy every 10 years starting at age 86.  Hepatitis C blood test.  Hepatitis B blood test.  Sexually transmitted disease (STD) testing.  Diabetes screening. This is done by checking your blood sugar (glucose) after you have not eaten for a while (fasting). You may have this done every 1-3 years.  Bone density scan. This is done to screen for osteoporosis. You may have this done starting at age 88.  Mammogram. This may be done every 1-2 years. Talk to your health care provider about how often you should have regular mammograms. Talk with your  health care provider about your test results, treatment options, and if necessary, the need for more tests. Vaccines  Your health care provider may recommend certain vaccines, such as:  Influenza vaccine. This is  recommended every year.  Tetanus, diphtheria, and acellular pertussis (Tdap, Td) vaccine. You may need a Td booster every 10 years.  Zoster vaccine. You may need this after age 73.  Pneumococcal 13-valent conjugate (PCV13) vaccine. One dose is recommended after age 85.  Pneumococcal polysaccharide (PPSV23) vaccine. One dose is recommended after age 64. Talk to your health care provider about which screenings and vaccines you need and how often you need them. This information is not intended to replace advice given to you by your health care provider. Make sure you discuss any questions you have with your health care provider. Document Released: 02/16/2015 Document Revised: 10/10/2015 Document Reviewed: 11/21/2014 Elsevier Interactive Patient Education  2017 [place] Prevention in the Home Falls can cause injuries. They can happen to people of all ages. There are many things you can do to make your home safe and to help prevent falls. What can I do on the outside of my home?  Regularly fix the edges of walkways and driveways and fix any cracks.  Remove anything that might make you trip as you walk through a door, such as a raised step or threshold.  Trim any bushes or trees on the path to your home.  Use bright outdoor lighting.  Clear any walking paths of anything that might make someone trip, such as rocks or tools.  Regularly check to see if handrails are loose or broken. Make sure that both sides of any steps have handrails.  Any raised decks and porches should have guardrails on the edges.  Have any leaves, snow, or ice cleared regularly.  Use sand or salt on walking paths during winter.  Clean up any spills in your garage right away. This includes oil or grease spills. What can I do in the bathroom?  Use night lights.  Install grab bars by the toilet and in the tub and shower. Do not use towel bars as grab bars.  Use non-skid mats or decals in the tub or  shower.  If you need to sit down in the shower, use a plastic, non-slip stool.  Keep the floor dry. Clean up any water that spills on the floor as soon as it happens.  Remove soap buildup in the tub or shower regularly.  Attach bath mats securely with double-sided non-slip rug tape.  Do not have throw rugs and other things on the floor that can make you trip. What can I do in the bedroom?  Use night lights.  Make sure that you have a light by your bed that is easy to reach.  Do not use any sheets or blankets that are too big for your bed. They should not hang down onto the floor.  Have a firm chair that has side arms. You can use this for support while you get dressed.  Do not have throw rugs and other things on the floor that can make you trip. What can I do in the kitchen?  Clean up any spills right away.  Avoid walking on wet floors.  Keep items that you use a lot in easy-to-reach places.  If you need to reach something above you, use a strong step stool that has a grab bar.  Keep electrical cords out of the way.  Do not use  floor polish or wax that makes floors slippery. If you must use wax, use non-skid floor wax.  Do not have throw rugs and other things on the floor that can make you trip. What can I do with my stairs?  Do not leave any items on the stairs.  Make sure that there are handrails on both sides of the stairs and use them. Fix handrails that are broken or loose. Make sure that handrails are as long as the stairways.  Check any carpeting to make sure that it is firmly attached to the stairs. Fix any carpet that is loose or worn.  Avoid having throw rugs at the top or bottom of the stairs. If you do have throw rugs, attach them to the floor with carpet tape.  Make sure that you have a light switch at the top of the stairs and the bottom of the stairs. If you do not have them, ask someone to add them for you. What else can I do to help prevent  falls?  Wear shoes that:  Do not have high heels.  Have rubber bottoms.  Are comfortable and fit you well.  Are closed at the toe. Do not wear sandals.  If you use a stepladder:  Make sure that it is fully opened. Do not climb a closed stepladder.  Make sure that both sides of the stepladder are locked into place.  Ask someone to hold it for you, if possible.  Clearly mark and make sure that you can see:  Any grab bars or handrails.  First and last steps.  Where the edge of each step is.  Use tools that help you move around (mobility aids) if they are needed. These include:  Canes.  Walkers.  Scooters.  Crutches.  Turn on the lights when you go into a dark area. Replace any light bulbs as soon as they burn out.  Set up your furniture so you have a clear path. Avoid moving your furniture around.  If any of your floors are uneven, fix them.  If there are any pets around you, be aware of where they are.  Review your medicines with your doctor. Some medicines can make you feel dizzy. This can increase your chance of falling. Ask your doctor what other things that you can do to help prevent falls. This information is not intended to replace advice given to you by your health care provider. Make sure you discuss any questions you have with your health care provider. Document Released: 11/16/2008 Document Revised: 06/28/2015 Document Reviewed: 02/24/2014 Elsevier Interactive Patient Education  2017 [employer].

## 2019-12-20 NOTE — Progress Notes (Signed)
Subjective:   Natalie [name] is a 69 y.o. female who presents for Medicare Annual (Subsequent) preventive examination.  Virtual Visit via Telephone Note  I connected with  [name] on 12/20/19 at  2:10 PM EST by telephone and verified that I am speaking with the correct person using two identifiers.  Medicare Annual Wellness visit completed telephonically due to Covid-19 pandemic.   Location: Patient: home Provider: [place]   I discussed the limitations, risks, security and privacy concerns of performing an evaluation and management service by telephone and the availability of in person appointments. The patient expressed understanding and agreed to proceed.  Unable to perform video visit due to video visit attempted and failed and/or patient does not have video capability.   Some vital signs may be absent or patient reported.   Clemetine Marker, LPN    Review of Systems     Cardiac Risk Factors include: advanced age (>64men, >58 women);obesity (BMI >30kg/m2)     Objective:    There were no vitals filed for this visit. There is no height or weight on file to calculate BMI.  Advanced Directives 12/20/2019 12/22/2018 11/18/2018 12/20/2017 08/03/2016 04/30/2016 11/02/2015  Does Patient Have a Medical Advance Directive? Yes No;Yes Yes Yes Yes Yes Yes  Type of Paramedic of [place];Living will Living will;Healthcare Power of [place];Living will [place];Living will [place];Living will [place];Living will [place];Living will  Does patient want to make changes to medical advance directive? - - Yes (MAU/Ambulatory/Procedural Areas - Information given) - - - No - Patient declined  Copy of [place] in Chart? No - copy requested - - No - copy requested No - copy requested - No - copy requested  Would patient like information on  creating a medical advance directive? - - - No - Patient declined No - Patient declined - -    Current Medications (verified) Outpatient Encounter Medications as of 12/20/2019  Medication Sig  . buPROPion (WELLBUTRIN SR) 150 MG 12 hr tablet Take 150 mg by mouth 2 (two) times daily.  . citalopram (CELEXA) 40 MG tablet Take 40 mg by mouth daily.  . clindamycin-benzoyl peroxide (BENZACLIN) gel Apply topically 2 (two) times daily.  . Cyanocobalamin (VITAMIN B-12) 1000 MCG SUBL Place under the tongue.  Marland Kitchen levothyroxine (SYNTHROID) 75 MCG tablet Take 1 tablet (75 mcg total) by mouth daily before breakfast.  . Multiple Vitamins-Minerals (MULTIVITAMIN WOMEN 50+ PO) Take 1 tablet by mouth daily.  . Omega-3 Fatty Acids (FISH OIL) 1000 MG CAPS Take 1 capsule by mouth daily.  . SUMAtriptan (IMITREX) 100 MG tablet May repeat in 2 hours if headache persists or recurs.  . tolterodine (DETROL LA) 2 MG 24 hr capsule Take 1 capsule (2 mg total) by mouth daily.  Marland Kitchen topiramate (TOPAMAX) 25 MG tablet Take 25 mg by mouth daily.   . Vitamin D, Ergocalciferol, (DRISDOL) 1.25 MG (50000 UNIT) CAPS capsule TAKE 1 CAPSULE BY MOUTH EVERY 7 DAYS   No facility-administered encounter medications on file as of 12/20/2019.    Allergies (verified) Sulfamethoxazole-trimethoprim   History: Past Medical History:  Diagnosis Date  . Acute bronchospasm due to viral infection   . Anemia    history of  . Depressive disorder   . Epistaxis   . Fatigue   . Hearing loss   . Hypothyroidism   . Memory change   . Migraine   .  Numbness and tingling   . Obesity (BMI 30.0-34.9)   . Osteoarthritis   . Osteoporosis   . Polyuria   . Premature menopause   . Seizures ([place])    history of mini seizures, possible migraine induced   Past Surgical History:  Procedure Laterality Date  . ABLATION  2006  . CATARACT EXTRACTION W/ INTRAOCULAR LENS IMPLANT Bilateral   . COLONOSCOPY    . COLONOSCOPY WITH PROPOFOL N/A 12/22/2018    Procedure: COLONOSCOPY WITH PROPOFOL;  Surgeon: Virgel Manifold, MD;  Location: [facility] ENDOSCOPY;  Service: Endoscopy;  Laterality: N/A;  . EYE SURGERY  Spring 2018   Cataracts both eyes  . TONSILLECTOMY     Family History  Problem Relation Age of Onset  . Early death Father        suicide  . Depression Father   . Diabetes Father   . Cancer Father        prostate  . Hearing loss Father        due to war  . Alzheimer's disease Mother   . Heart disease Brother   . Heart disease Maternal Aunt   . Dementia Maternal Aunt   . Heart disease Maternal Uncle   . Dementia Maternal Grandmother   . Heart disease Brother    Social History   Socioeconomic History  . Marital status: Single    Spouse name: Not on file  . Number of children: 1  . Years of education: Not on file  . Highest education level: Bachelor's degree (e.g., BA, AB, BS)  Occupational History  . Occupation: retired   Tobacco Use  . Smoking status: Former Smoker    Packs/day: 0.50    Years: 6.00    Pack years: 3.00    Types: Cigarettes    Quit date: 02/11/1974    Years since quitting: 45.8  . Smokeless tobacco: Never Used  Vaping Use  . Vaping Use: Never used  Substance and Sexual Activity  . Alcohol use: Yes    Alcohol/week: 0.0 standard drinks    Comment: 2-3 drinks monthly  . Drug use: Never  . Sexual activity: Not Currently    Comment: Don't use not sexually active  Other Topics Concern  . Not on file  Social History Narrative   Raised an adopted child on her own   Working part time reviewed documents   Social Determinants of Health   Financial Resource Strain: Low Risk   . Difficulty of Paying Living Expenses: Not hard at all  Food Insecurity: No Food Insecurity  . Worried About Charity fundraiser in the Last Year: Never true  . Ran Out of Food in the Last Year: Never true  Transportation Needs: No Transportation Needs  . Lack of Transportation (Medical): No  . Lack of Transportation  (Non-Medical): No  Physical Activity: Inactive  . Days of Exercise per Week: 0 days  . Minutes of Exercise per Session: 0 min  Stress: No Stress Concern Present  . Feeling of Stress : Not at all  Social Connections: Moderately Integrated  . Frequency of Communication with Friends and Family: More than three times a week  . Frequency of Social Gatherings with Friends and Family: Twice a week  . Attends Religious Services: More than 4 times per year  . Active Member of Clubs or Organizations: Yes  . Attends Archivist Meetings: More than 4 times per year  . Marital Status: Never married    Tobacco Counseling Counseling given:  Not Answered   Clinical Intake:  Pre-visit preparation completed: Yes  Pain : No/denies pain     Nutritional Risks: None Diabetes: No  How often do you need to have someone help you when you read instructions, pamphlets, or other written materials from your doctor or pharmacy?: 1 - Never    Interpreter Needed?: No  Information entered by :: Clemetine Marker LPN   Activities of Daily Living In your present state of health, do you have any difficulty performing the following activities: 12/20/2019 11/09/2019  Hearing? Y Y  Comment declines hearing aids -  Vision? N N  Difficulty concentrating or making decisions? N N  Walking or climbing stairs? N N  Dressing or bathing? N N  Doing errands, shopping? N N  Preparing Food and eating ? N -  Using the Toilet? N -  In the past six months, have you accidently leaked urine? Y -  Comment wears pads for protection -  Do you have problems with loss of bowel control? N -  Managing your Medications? N -  Managing your Finances? N -  Housekeeping or managing your Housekeeping? N -  Some recent data might be hidden    Patient Care Team: [name], MD as PCP - General (Family Medicine) Thurmond Butts Orson Ape, MD as Attending Physician (Psychiatry)  Indicate any recent Medical Services you may have  received from other than [facility] providers in the past year (date may be approximate).     Assessment:   This is a routine wellness examination for [place].  Hearing/Vision screen  Hearing Screening   125Hz  250Hz  500Hz  1000Hz  2000Hz  3000Hz  4000Hz  6000Hz  8000Hz   Right ear:           Left ear:           Comments: Pt c/o mild hearing loss with higher ranges but does not need hearing aids yet  Vision Screening Comments: Vision screenings done at [place] issues and exercise activities discussed: Current Exercise Habits: The patient does not participate in regular exercise at present, Exercise limited by: None identified  Goals    . Chronic Care Management      Current Barriers:  . Chronic Disease Management support and education needs related to elevated blood glucose, hypothyroidism and degenerative arthritis.  Case Manager Clinical Goal(s):  Marland Kitchen Over the next 90 days, patient will take all medications as prescribed. . Over the next 90 days, patient will attend all scheduled medical appointments. . Over the next 90 days, patient will continue adherence with recommended cardiac prudent diet. . Over the next 90 days, patient will follow recommended safety measures to prevent falls and injuries.  Interventions:  . Reviewed medications. Denies concerns regarding ability to self-manage medications. Willing to start medication for urinary urgency. Denies concerns regarding prescription costs.  . Discussed dietary intake and heart healthy nutritional options. . Discussed activity tolerance and safety measures. Reports recent fall due to accidentally stepping into a hole in the yard. Reports she was not injured and did not require medical evaluation. Otherwise doing well. No difficulties ambulating. No decline in activity tolerance. . Reviewed pending medical appointments. Denies concerns regarding transportation. . Discussed plans for ongoing care management follow up.  Will follow-up regarding medication request.   Patient Self Care Activities:  . Self administers medications . Attends scheduled provider appointments . Calls pharmacy for medication refills . Performs ADL's independently . Performs IADL's independently   Please see past updates related to this goal  by clicking on the "Past Updates" button in the selected goal         Depression Screen PHQ 2/9 Scores 12/20/2019 11/09/2019 09/01/2019 06/10/2019 05/10/2019 04/04/2019 11/18/2018  PHQ - 2 Score 2 0 0 2 2 1 4   PHQ- 9 Score 3 - 0 3 4 - 17  Exception Documentation - - - - - (No Data) -  Not completed - - - - - - -    Fall Risk Fall Risk  12/20/2019 11/09/2019 09/01/2019 05/10/2019 04/04/2019  Falls in the past year? 1 1 0 0 0  Number falls in past yr: 1 1 0 0 -  Injury with Fall? 0 0 0 0 -  Comment - - - - -  Risk for fall due to : History of fall(s) - - - Medication side effect  Risk for fall due to: Comment - - - - -  Follow up Falls prevention discussed - - - Falls prevention discussed    Any stairs in or around the home? Yes  If so, are there any without handrails? No  Home free of loose throw rugs in walkways, pet beds, electrical cords, etc? Yes  Adequate lighting in your home to reduce risk of falls? Yes   ASSISTIVE DEVICES UTILIZED TO PREVENT FALLS:  Life alert? No  Use of a cane, walker or w/c? No  Grab bars in the bathroom? No  Shower chair or bench in shower? No  Elevated toilet seat or a handicapped toilet? Yes   TIMED UP AND GO:  Was the test performed? No . Telephonic visit  Cognitive Function:     6CIT Screen 12/20/2019 11/18/2018  What Year? 0 points 0 points  What month? 0 points 0 points  What time? 0 points 0 points  Count back from 20 0 points 0 points  Months in reverse 0 points 0 points  Repeat phrase 0 points 0 points  Total Score 0 0    Immunizations Immunization History  Administered Date(s) Administered  . Fluad Quad(high Dose 65+) 11/16/2018,  11/09/2019  . Influenza, High Dose Seasonal PF 11/02/2015, 11/03/2016, 11/24/2017  . Influenza,inj,Quad PF,6+ Mos 10/26/2014  . PFIZER SARS-COV-2 Vaccination 03/15/2019, 04/05/2019, 11/28/2019  . Pneumococcal Conjugate-13 11/03/2016  . Pneumococcal Polysaccharide-23 06/28/2015  . Tdap 12/20/2012    TDAP status: Up to date   Flu Vaccine status: Up to date   Pneumococcal vaccine status: Up to date   Covid-19 vaccine status: Completed vaccines  Qualifies for Shingles Vaccine? Yes   Zostavax completed No   Shingrix Completed?: No.    Education has been provided regarding the importance of this vaccine. Patient has been advised to call insurance company to determine out of pocket expense if they have not yet received this vaccine. Advised may also receive vaccine at local pharmacy or Health Dept. Verbalized acceptance and understanding.  Screening Tests Health Maintenance  Topic Date Due  . MAMMOGRAM  07/23/2017  . PAP SMEAR-Modifier  05/09/2020  . COLONOSCOPY  12/21/2021  . TETANUS/TDAP  12/21/2022  . INFLUENZA VACCINE  Completed  . DEXA SCAN  Completed  . COVID-19 Vaccine  Completed  . Hepatitis C Screening  Completed  . PNA vac Low Risk Adult  Completed    Health Maintenance  Health Maintenance Due  Topic Date Due  . MAMMOGRAM  07/23/2017    Colorectal cancer screening: Completed 12/22/18. Repeat every 3 years   Mammogram status: Completed 07/24/15. Repeat every year. Ordered today  Bone Density status: Completed 07/24/15. Results  reflect: Bone density results: OSTEOPOROSIS. Repeat every 2 years. Ordered today   Lung Cancer Screening: (Low Dose CT Chest recommended if Age 55-80 years, 30 pack-year currently smoking OR have quit w/in 15years.) does not qualify.   Additional Screening:  Hepatitis C Screening: does qualify; Completed 06/28/15  Vision Screening: Recommended annual ophthalmology exams for early detection of glaucoma and other disorders of the eye. Is the  patient up to date with their annual eye exam?  Yes  Who is the provider or what is the name of the office in which the patient attends annual eye exams? [place] Screening: Recommended annual dental exams for proper oral hygiene  Community Resource Referral / Chronic Care Management: CRR required this visit?  No   CCM required this visit?  No      Plan:     I have personally reviewed and noted the following in the patient's chart:   . Medical and social history . Use of alcohol, tobacco or illicit drugs  . Current medications and supplements . Functional ability and status . Nutritional status . Physical activity . Advanced directives . List of other physicians . Hospitalizations, surgeries, and ER visits in previous 12 months . Vitals . Screenings to include cognitive, depression, and falls . Referrals and appointments  In addition, I have reviewed and discussed with patient certain preventive protocols, quality metrics, and best practice recommendations. A written personalized care plan for preventive services as well as general preventive health recommendations were provided to patient.     Clemetine Marker, LPN   [id number]   Nurse Notes: none

## 2020-01-02 DIAGNOSIS — H40013 Open angle with borderline findings, low risk, bilateral: Secondary | ICD-10-CM | POA: Diagnosis not present

## 2020-01-02 DIAGNOSIS — H43813 Vitreous degeneration, bilateral: Secondary | ICD-10-CM | POA: Diagnosis not present

## 2020-01-22 ENCOUNTER — Encounter: Payer: Self-pay | Admitting: Family Medicine

## 2020-01-23 ENCOUNTER — Other Ambulatory Visit: Payer: Self-pay

## 2020-01-24 NOTE — Progress Notes (Signed)
Name: Natalie [name]   MRN: [id number]    DOB: 1950/07/12   Date:01/25/2020       Progress Note  Subjective  Chief Complaint  Double vision  HPI  Double vision : she states symptoms started about four months ago , she had 3 different episodes. She states lasts about 5 minutes resolves by itself. Normal if she closes one eye, double only when both eyes open. She states vertical images. Daughter witnessed one of the episodes and she did not noticed any change in her eyes. Recently had eye exam by optometrist but at the time symptoms were not present. She states symptoms not at the end of the day. Either late morning or afternoon. She has hypothyroidism.    Urinary urgency: she stopped medication because it does not work She does not want to see Urologist at this time  Mass left lower leg: she noticed it about one week ago, below medial left knee, no redness or increase in warmth but tender to touch   Patient Active Problem List   Diagnosis Date Noted  . HPV test positive 07/08/2015  . Difficulty hearing 06/28/2015  . Arthritis, degenerative 06/28/2015  . Obesity, Class I, BMI 30-34.9 06/28/2015  . Paresthesia 06/28/2015  . Purpura, nonthrombopenic ([place]) 06/28/2015  . Skin lesion of right leg 06/28/2015  . Other specified hypothyroidism 08/09/2014  . Migraine without aura and without status migrainosus, not intractable 09/19/2008  . Moderate major depression ([place]) 09/07/2006  . OP (osteoporosis) 09/07/2006    Past Surgical History:  Procedure Laterality Date  . ABLATION  2006  . CATARACT EXTRACTION W/ INTRAOCULAR LENS IMPLANT Bilateral   . COLONOSCOPY    . COLONOSCOPY WITH PROPOFOL N/A 12/22/2018   Procedure: COLONOSCOPY WITH PROPOFOL;  Surgeon: Virgel Manifold, MD;  Location: [facility] ENDOSCOPY;  Service: Endoscopy;  Laterality: N/A;  . EYE SURGERY  Spring 2018   Cataracts both eyes  . TONSILLECTOMY      Family History  Problem Relation Age of Onset  . Early death  Father        suicide  . Depression Father   . Diabetes Father   . Cancer Father        prostate  . Hearing loss Father        due to war  . Alzheimer's disease Mother   . Heart disease Brother   . Heart disease Maternal Aunt   . Dementia Maternal Aunt   . Heart disease Maternal Uncle   . Dementia Maternal Grandmother   . Heart disease Brother     Social History   Tobacco Use  . Smoking status: Former Smoker    Packs/day: 0.50    Years: 6.00    Pack years: 3.00    Types: Cigarettes    Quit date: 02/11/1974    Years since quitting: 45.9  . Smokeless tobacco: Never Used  Substance Use Topics  . Alcohol use: Yes    Alcohol/week: 0.0 standard drinks    Comment: 2-3 drinks monthly     Current Outpatient Medications:  .  buPROPion (WELLBUTRIN SR) 150 MG 12 hr tablet, Take 150 mg by mouth 2 (two) times daily., Disp: , Rfl:  .  citalopram (CELEXA) 40 MG tablet, Take 40 mg by mouth daily., Disp: , Rfl:  .  clindamycin-benzoyl peroxide (BENZACLIN) gel, Apply topically 2 (two) times daily., Disp: 50 g, Rfl: 0 .  Cyanocobalamin (VITAMIN B-12) 1000 MCG SUBL, Place under the tongue., Disp: , Rfl:  .  levothyroxine (SYNTHROID) 75 MCG tablet, Take 1 tablet (75 mcg total) by mouth daily before breakfast., Disp: 90 tablet, Rfl: 1 .  Multiple Vitamins-Minerals (MULTIVITAMIN WOMEN 50+ PO), Take 1 tablet by mouth daily., Disp: , Rfl:  .  Omega-3 Fatty Acids (FISH OIL) 1000 MG CAPS, Take 1 capsule by mouth daily., Disp: , Rfl:  .  SUMAtriptan (IMITREX) 100 MG tablet, May repeat in 2 hours if headache persists or recurs., Disp: 10 tablet, Rfl: 0 .  topiramate (TOPAMAX) 25 MG tablet, Take 25 mg by mouth daily. , Disp: , Rfl:  .  Vitamin D, Ergocalciferol, (DRISDOL) 1.25 MG (50000 UNIT) CAPS capsule, TAKE 1 CAPSULE BY MOUTH EVERY 7 DAYS, Disp: 12 capsule, Rfl: 1  Allergies  Allergen Reactions  . Sulfamethoxazole-Trimethoprim Hives    I personally reviewed active problem list, medication  list, allergies, family history, social history, health maintenance with the patient/caregiver today.   ROS  Ten systems reviewed and is negative except as mentioned in HPI   Objective  Vitals:   01/25/20 0739  BP: 134/82  Pulse: 92  Resp: 16  Temp: (!) 97.5 F (36.4 C)  TempSrc: Oral  SpO2: 99%  Weight: 221 lb 3.2 oz (100.3 kg)  Height: 5\' 6"  (1.676 m)    Body mass index is 35.7 kg/m.  Physical Exam  Constitutional: Patient appears well-developed and well-nourished. Obese  No distress.  HEENT: head atraumatic, normocephalic, pupils equal and reactive to light, ears normal,  neck supple, throat within normal limits, normal extra ocular movements, no ptosis Cardiovascular: Normal rate, regular rhythm and normal heart sounds.  No murmur heard. No BLE edema. Pulmonary/Chest: Effort normal and breath sounds normal. No respiratory distress. Abdominal: Soft.  There is no tenderness. Neurological: normal cranial nerves during exam, normal balanced, romberg negative, normal reflexes  Skin: subcutaneous mass, lumpy below left medial knee, borders are smooth Psychiatric: Patient has a normal mood and affect. behavior is normal. Judgment and thought content normal.  PHQ2/9: Depression screen [facility] 2/9 01/25/2020 12/20/2019 11/09/2019 09/01/2019 06/10/2019  Decreased Interest 0 1 0 0 1  Down, Depressed, Hopeless 0 1 0 0 1  PHQ - 2 Score 0 2 0 0 2  Altered sleeping 0 0 - 0 0  Tired, decreased energy 1 0 - 0 1  Change in appetite 1 0 - 0 0  Feeling bad or failure about yourself  0 0 - 0 0  Trouble concentrating 0 0 - 0 0  Moving slowly or fidgety/restless 0 1 - 0 0  Suicidal thoughts 0 0 - 0 0  PHQ-9 Score 2 3 - 0 3  Difficult doing work/chores - Not difficult at all - - -  Some recent data might be hidden    phq 9 is negative   Fall Risk: Fall Risk  01/25/2020 12/20/2019 11/09/2019 09/01/2019 05/10/2019  Falls in the past year? 1 1 1  0 0  Number falls in past yr: 0 1 1 0 0  Injury  with Fall? 0 0 0 0 0  Comment - - - - -  Risk for fall due to : - History of fall(s) - - -  Risk for fall due to: Comment - - - - -  Follow up - Falls prevention discussed - - -     Functional Status Survey: Is the patient deaf or have difficulty hearing?: Yes Does the patient have difficulty seeing, even when wearing glasses/contacts?: No Does the patient have difficulty concentrating, remembering, or making decisions?: No  Does the patient have difficulty walking or climbing stairs?: Yes Does the patient have difficulty dressing or bathing?: No Does the patient have difficulty doing errands alone such as visiting a doctor's office or shopping?: No    Assessment & Plan  1. Double vision with both eyes open  - COMPLETE METABOLIC PANEL WITH GFR - Ambulatory referral to Neurology  2. B12 deficiency  - Vitamin B12 - CBC with Differential/Platelet  3. Hyperglycemia  - Hemoglobin A1c - COMPLETE METABOLIC PANEL WITH GFR  4. Vitamin D deficiency   5. Dyslipidemia  - COMPLETE METABOLIC PANEL WITH GFR  6. Adult hypothyroidism  - TSH   7. Mass of left lower leg  Discussed referral to surgeon, but she will monitor size and see if pain resolves and if she decides she will call me back

## 2020-01-25 ENCOUNTER — Other Ambulatory Visit: Payer: Self-pay

## 2020-01-25 ENCOUNTER — Ambulatory Visit (INDEPENDENT_AMBULATORY_CARE_PROVIDER_SITE_OTHER): Payer: Medicare Other | Admitting: Family Medicine

## 2020-01-25 ENCOUNTER — Encounter: Payer: Self-pay | Admitting: Family Medicine

## 2020-01-25 VITALS — BP 134/82 | HR 92 | Temp 97.5°F | Resp 16 | Ht 66.0 in | Wt 221.2 lb

## 2020-01-25 DIAGNOSIS — E039 Hypothyroidism, unspecified: Secondary | ICD-10-CM | POA: Diagnosis not present

## 2020-01-25 DIAGNOSIS — H532 Diplopia: Secondary | ICD-10-CM | POA: Diagnosis not present

## 2020-01-25 DIAGNOSIS — E538 Deficiency of other specified B group vitamins: Secondary | ICD-10-CM | POA: Diagnosis not present

## 2020-01-25 DIAGNOSIS — E785 Hyperlipidemia, unspecified: Secondary | ICD-10-CM

## 2020-01-25 DIAGNOSIS — E559 Vitamin D deficiency, unspecified: Secondary | ICD-10-CM | POA: Diagnosis not present

## 2020-01-25 DIAGNOSIS — R2242 Localized swelling, mass and lump, left lower limb: Secondary | ICD-10-CM | POA: Diagnosis not present

## 2020-01-25 DIAGNOSIS — R739 Hyperglycemia, unspecified: Secondary | ICD-10-CM | POA: Diagnosis not present

## 2020-01-26 LAB — HEMOGLOBIN A1C
Hgb A1c MFr Bld: 5.2 % of total Hgb (ref <5.7)
Mean Plasma Glucose: 103 mg/dL
eAG (mmol/L): 5.7 mmol/L

## 2020-01-26 LAB — VITAMIN B12: Vitamin B-12: 758 pg/mL (ref 200–1100)

## 2020-01-26 LAB — COMPLETE METABOLIC PANEL WITH GFR
AG Ratio: 1.4 (calc) (ref 1.0–2.5)
ALT: 11 U/L (ref 6–29)
AST: 17 U/L (ref 10–35)
Albumin: 4.2 g/dL (ref 3.6–5.1)
Alkaline phosphatase (APISO): 187 U/L — ABNORMAL HIGH (ref 37–153)
BUN: 20 mg/dL (ref 7–25)
CO2: 25 mmol/L (ref 20–32)
Calcium: 9.2 mg/dL (ref 8.6–10.4)
Chloride: 107 mmol/L (ref 98–110)
Creat: 0.94 mg/dL (ref 0.50–0.99)
GFR, Est African American: 72 mL/min/{1.73_m2} (ref >=60)
GFR, Est Non African American: 62 mL/min/{1.73_m2} (ref >=60)
Globulin: 2.9 g/dL (calc) (ref 1.9–3.7)
Glucose, Bld: 98 mg/dL (ref 65–99)
Potassium: 3.4 mmol/L — ABNORMAL LOW (ref 3.5–5.3)
Sodium: 141 mmol/L (ref 135–146)
Total Bilirubin: 0.6 mg/dL (ref 0.2–1.2)
Total Protein: 7.1 g/dL (ref 6.1–8.1)

## 2020-01-26 LAB — CBC WITH DIFFERENTIAL/PLATELET
Absolute Monocytes: 302 cells/uL (ref 200–950)
Basophils Absolute: 29 cells/uL (ref 0–200)
Basophils Relative: 0.5 %
Eosinophils Absolute: 220 cells/uL (ref 15–500)
Eosinophils Relative: 3.8 %
HCT: 44 % (ref 35.0–45.0)
Hemoglobin: 14.7 g/dL (ref 11.7–15.5)
Lymphs Abs: 1195 cells/uL (ref 850–3900)
MCH: 31.7 pg (ref 27.0–33.0)
MCHC: 33.4 g/dL (ref 32.0–36.0)
MCV: 95 fL (ref 80.0–100.0)
MPV: 9.9 fL (ref 7.5–12.5)
Monocytes Relative: 5.2 %
Neutro Abs: 4054 cells/uL (ref 1500–7800)
Neutrophils Relative %: 69.9 %
Platelets: 201 10*3/uL (ref 140–400)
RBC: 4.63 10*6/uL (ref 3.80–5.10)
RDW: 12.1 % (ref 11.0–15.0)
Total Lymphocyte: 20.6 %
WBC: 5.8 10*3/uL (ref 3.8–10.8)

## 2020-01-26 LAB — TSH: TSH: 2.79 mIU/L (ref 0.40–4.50)

## 2020-02-18 ENCOUNTER — Encounter: Payer: Self-pay | Admitting: Family Medicine

## 2020-02-28 ENCOUNTER — Emergency Department: Payer: Medicare Other

## 2020-02-28 ENCOUNTER — Inpatient Hospital Stay
Admission: EM | Admit: 2020-02-28 | Discharge: 2020-03-01 | DRG: 065 | Disposition: A | Discharge status: Home / Self Care | Payer: Medicare Other | Attending: Internal Medicine | Admitting: Internal Medicine

## 2020-02-28 ENCOUNTER — Other Ambulatory Visit: Payer: Self-pay

## 2020-02-28 DIAGNOSIS — I10 Essential (primary) hypertension: Secondary | ICD-10-CM | POA: Diagnosis present

## 2020-02-28 DIAGNOSIS — Z8673 Personal history of transient ischemic attack (TIA), and cerebral infarction without residual deficits: Secondary | ICD-10-CM

## 2020-02-28 DIAGNOSIS — H532 Diplopia: Secondary | ICD-10-CM | POA: Diagnosis present

## 2020-02-28 DIAGNOSIS — I639 Cerebral infarction, unspecified: Principal | ICD-10-CM

## 2020-02-28 DIAGNOSIS — E038 Other specified hypothyroidism: Secondary | ICD-10-CM | POA: Diagnosis present

## 2020-02-28 DIAGNOSIS — I6781 Acute cerebrovascular insufficiency: Secondary | ICD-10-CM | POA: Diagnosis not present

## 2020-02-28 DIAGNOSIS — F321 Major depressive disorder, single episode, moderate: Secondary | ICD-10-CM | POA: Diagnosis not present

## 2020-02-28 DIAGNOSIS — R9431 Abnormal electrocardiogram [ECG] [EKG]: Secondary | ICD-10-CM | POA: Diagnosis not present

## 2020-02-28 DIAGNOSIS — I619 Nontraumatic intracerebral hemorrhage, unspecified: Secondary | ICD-10-CM | POA: Diagnosis not present

## 2020-02-28 DIAGNOSIS — G43009 Migraine without aura, not intractable, without status migrainosus: Secondary | ICD-10-CM | POA: Diagnosis present

## 2020-02-28 DIAGNOSIS — M810 Age-related osteoporosis without current pathological fracture: Secondary | ICD-10-CM | POA: Diagnosis not present

## 2020-02-28 DIAGNOSIS — D696 Thrombocytopenia, unspecified: Secondary | ICD-10-CM

## 2020-02-28 DIAGNOSIS — Z79899 Other long term (current) drug therapy: Secondary | ICD-10-CM

## 2020-02-28 DIAGNOSIS — D6959 Other secondary thrombocytopenia: Secondary | ICD-10-CM | POA: Diagnosis present

## 2020-02-28 DIAGNOSIS — Z20822 Contact with and (suspected) exposure to covid-19: Secondary | ICD-10-CM | POA: Diagnosis not present

## 2020-02-28 DIAGNOSIS — E039 Hypothyroidism, unspecified: Secondary | ICD-10-CM | POA: Diagnosis present

## 2020-02-28 DIAGNOSIS — Z7982 Long term (current) use of aspirin: Secondary | ICD-10-CM

## 2020-02-28 DIAGNOSIS — Z79890 Hormone replacement therapy (postmenopausal): Secondary | ICD-10-CM

## 2020-02-28 DIAGNOSIS — Z87891 Personal history of nicotine dependence: Secondary | ICD-10-CM

## 2020-02-28 DIAGNOSIS — R42 Dizziness and giddiness: Secondary | ICD-10-CM | POA: Diagnosis not present

## 2020-02-28 DIAGNOSIS — Z0389 Encounter for observation for other suspected diseases and conditions ruled out: Secondary | ICD-10-CM | POA: Diagnosis not present

## 2020-02-28 DIAGNOSIS — I63539 Cerebral infarction due to unspecified occlusion or stenosis of unspecified posterior cerebral artery: Secondary | ICD-10-CM | POA: Diagnosis present

## 2020-02-28 DIAGNOSIS — R29701 NIHSS score 1: Secondary | ICD-10-CM | POA: Diagnosis present

## 2020-02-28 HISTORY — DX: Personal history of transient ischemic attack (TIA), and cerebral infarction without residual deficits: Z86.73

## 2020-02-28 HISTORY — DX: Cerebral infarction, unspecified: I63.9

## 2020-02-28 LAB — BASIC METABOLIC PANEL
Anion gap: 13 (ref 5–15)
BUN: 16 mg/dL (ref 8–23)
CO2: 24 mmol/L (ref 22–32)
Calcium: 9.5 mg/dL (ref 8.9–10.3)
Chloride: 104 mmol/L (ref 98–111)
Creatinine, Ser: 0.93 mg/dL (ref 0.44–1.00)
GFR, Estimated: >60 mL/min (ref >60)
Glucose, Bld: 104 mg/dL — ABNORMAL HIGH (ref 70–99)
Potassium: 3.7 mmol/L (ref 3.5–5.1)
Sodium: 141 mmol/L (ref 135–145)

## 2020-02-28 LAB — CBC
HCT: 44.5 % (ref 36.0–46.0)
Hemoglobin: 14.7 g/dL (ref 12.0–15.0)
MCH: 30.9 pg (ref 26.0–34.0)
MCHC: 33 g/dL (ref 30.0–36.0)
MCV: 93.5 fL (ref 80.0–100.0)
Platelets: 108 10*3/uL — ABNORMAL LOW (ref 150–400)
RBC: 4.76 MIL/uL (ref 3.87–5.11)
RDW: 12.1 % (ref 11.5–15.5)
WBC: 6.9 10*3/uL (ref 4.0–10.5)
nRBC: 0 % (ref 0.0–0.2)

## 2020-02-28 IMAGING — MR MR MRA NECK WO/W CM
1 of 2 series · 20 of 48 positions shown · IV contrast (gadavist)
Comparison: Comparison made with corresponding brain MRI performed
on the same day.

CLINICAL DATA: Initial evaluation for intermittent visual
disturbance, double vision.

EXAM:
MRA NECK WITHOUT AND WITH CONTRAST
TECHNIQUE: Multiplanar and multiecho pulse sequences of the neck were obtained
without and with intravenous contrast. Angiographic images of the
neck were obtained using MRA technique without and with intravenous
contrast.
CONTRAST:  10mL GADAVIST GADOBUTROL 1 MMOL/ML IV SOLN

[Series 28: angio_fl3d_cor_post_ttc=2.0s · coronal · 0.9mm · 0.85mm/px · 20 of 96 slices shown]
[im 1/96]
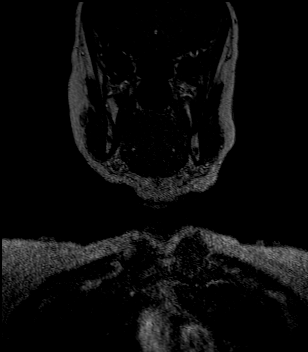
[im 6/96]
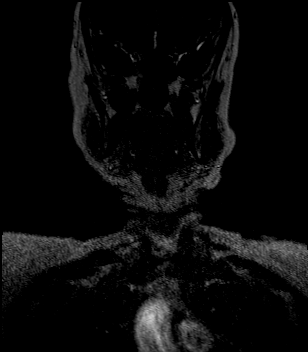
[im 11/96]
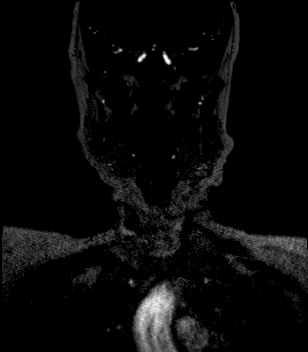
[im 16/96]
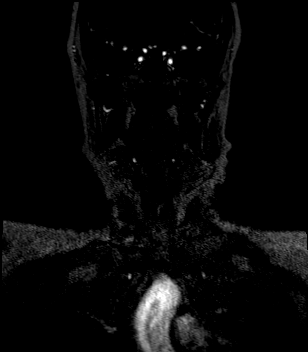
[im 21/96]
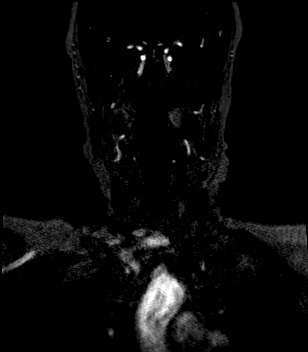
[im 26/96]
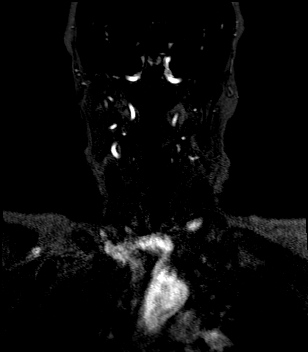
[im 31/96]
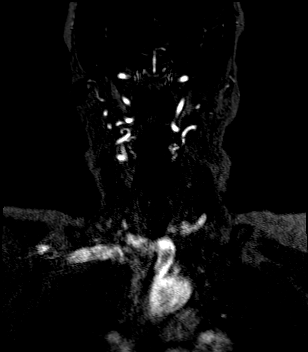
[im 36/96]
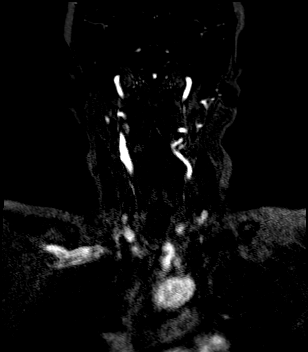
[im 41/96]
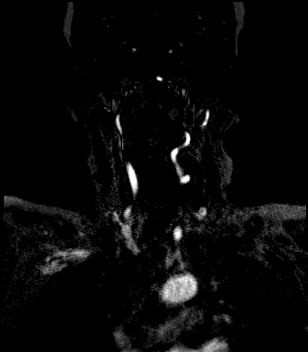
[im 46/96]
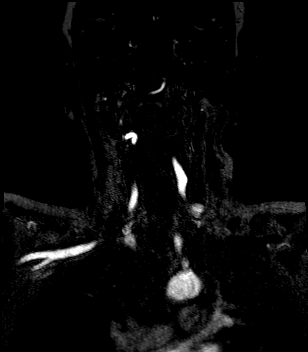
[im 51/96]
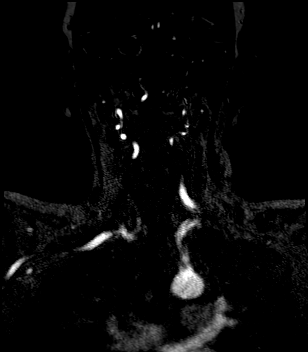
[im 56/96]
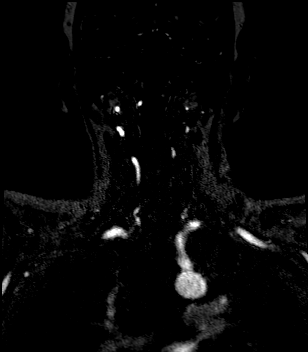
[im 61/96]
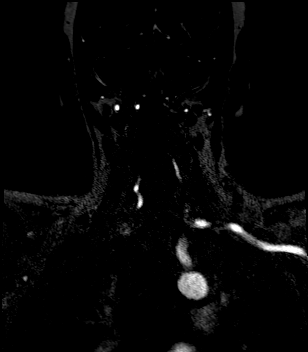
[im 66/96]
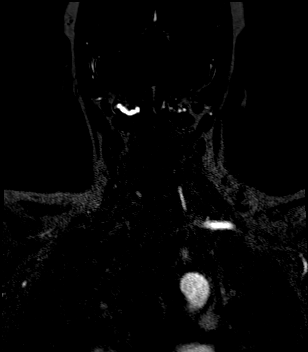
[im 71/96]
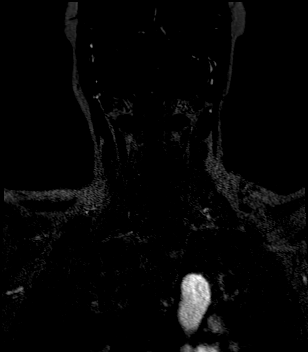
[im 76/96]
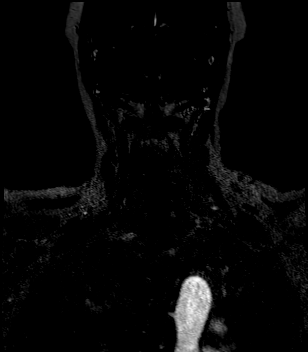
[im 81/96]
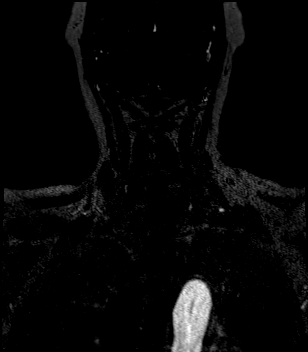
[im 86/96]
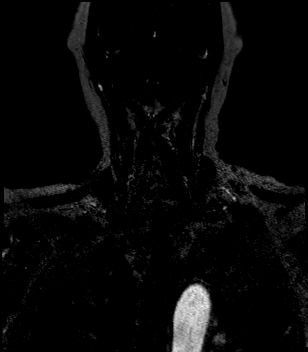
[im 91/96]
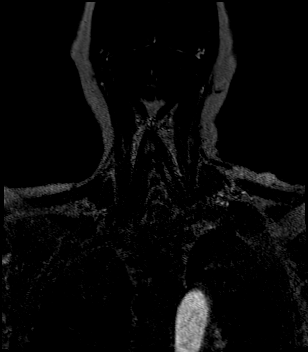
[im 96/96]
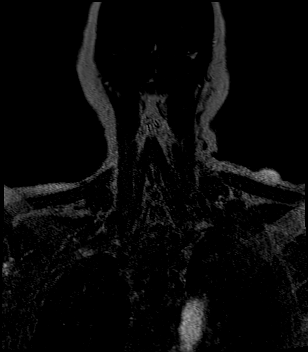

[20 of 48 positions shown; findings below may reference images not displayed]

FINDINGS: AORTIC ARCH: Examination mildly degraded by motion artifact.

Visualized aortic arch of normal caliber with normal 3 vessel
morphology. No hemodynamically significant stenosis seen about the
origin of the great vessels. Visualized subclavian arteries widely
patent.

RIGHT CAROTID SYSTEM: Right CCA patent from its origin to the
bifurcation without stenosis. No significant atheromatous
irregularity or stenosis about the right carotid bifurcation. Right
ICA patent distally without stenosis, evidence for dissection, or
occlusion.

LEFT CAROTID SYSTEM: Left CCA patent from its origin to the
bifurcation without stenosis. No significant atheromatous
irregularity or stenosis about the left bifurcation. Left ICA patent
distally without stenosis, evidence for dissection or occlusion.

VERTEBRAL ARTERIES: Both vertebral arteries arise from the
subclavian arteries. Neither vertebral artery origin well visualized
due to motion artifact. Right vertebral artery dominant. Visualized
vertebral arteries patent within the neck without stenosis, evidence
for dissection or occlusion. Diminutive left vertebral artery
appears to largely terminate in PICA, although a tiny branch
ascending towards the vertebrobasilar junction.
IMPRESSION: Normal MRA of the neck. No hemodynamically significant stenosis or
other acute vascular abnormality.

## 2020-02-28 IMAGING — CT CT HEAD W/O CM
3 series · 15 of 46 positions shown, 18 images · non-contrast
Comparison: Head CT [DATE].

CLINICAL DATA: 70-year-old female with dizziness and vision changes
for 24 hours. TIA.

EXAM:
CT HEAD WITHOUT CONTRAST
TECHNIQUE: Contiguous axial images were obtained from the base of the skull
through the vertex without intravenous contrast.

[Series 2: head wo · axial · 0.41mm/px · z∈[-114,+6]mm · 9 of 29 slices shown, 12 images]
[im 3/29  brain]
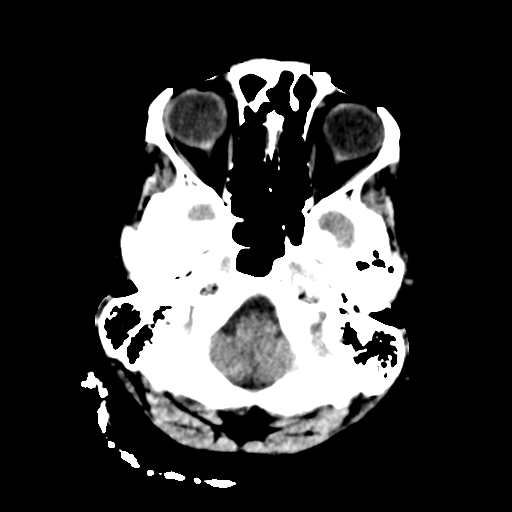
[im 3/29  bone]
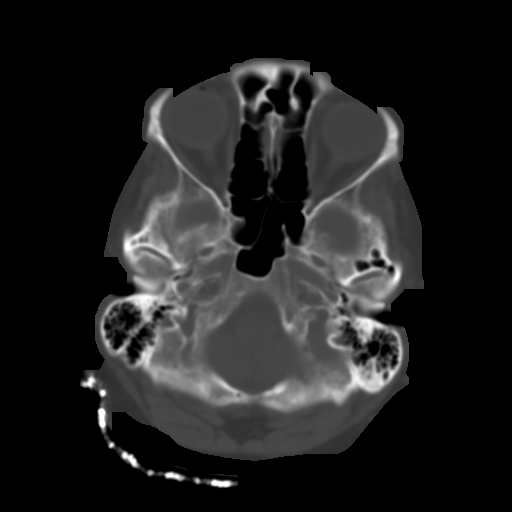
[im 6/29  brain]
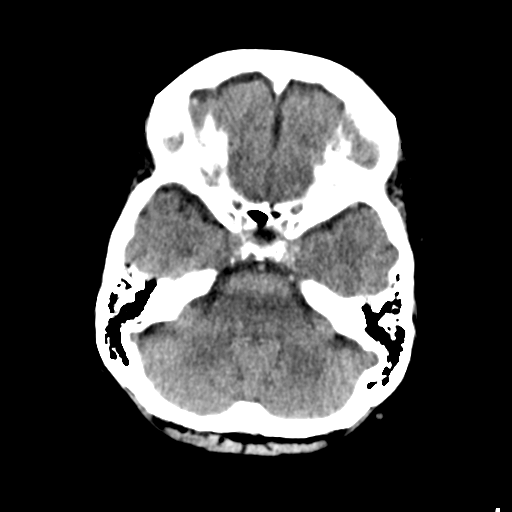
[im 9/29  brain]
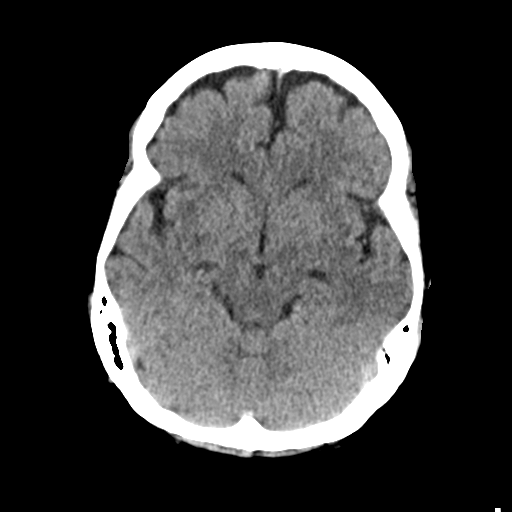
[im 12/29  brain]
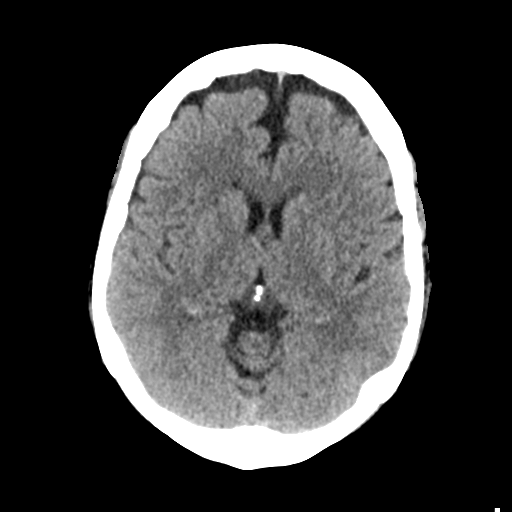
[im 15/29  brain]
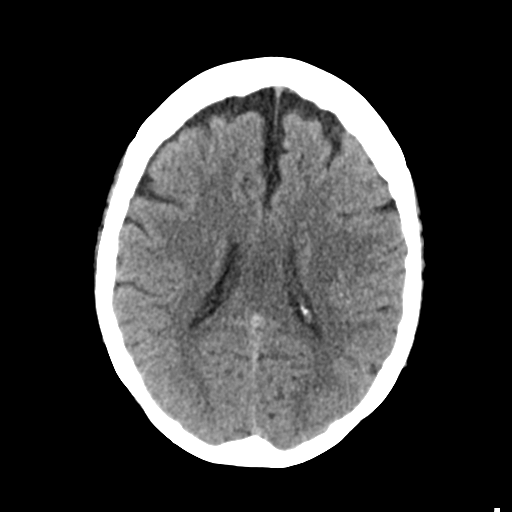
[im 15/29  bone]
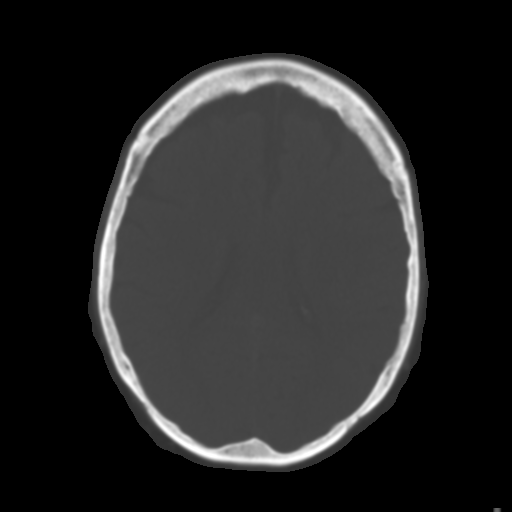
[im 18/29  brain]
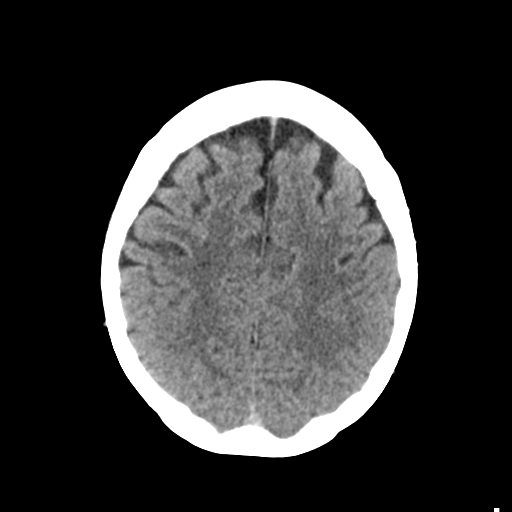
[im 21/29  brain]
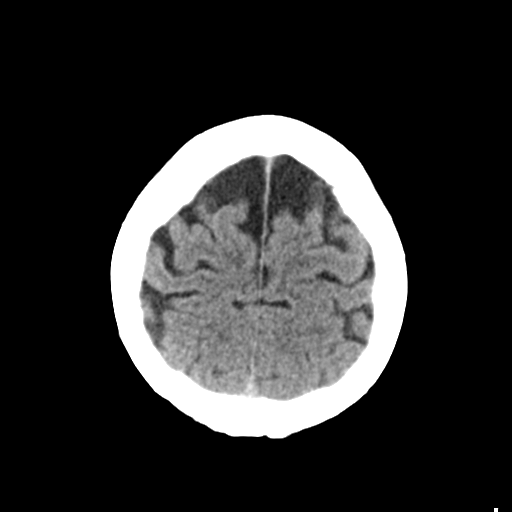
[im 24/29  brain]
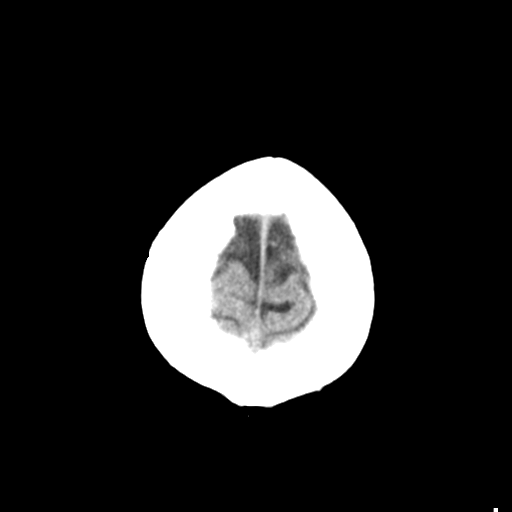
[im 27/29  brain]
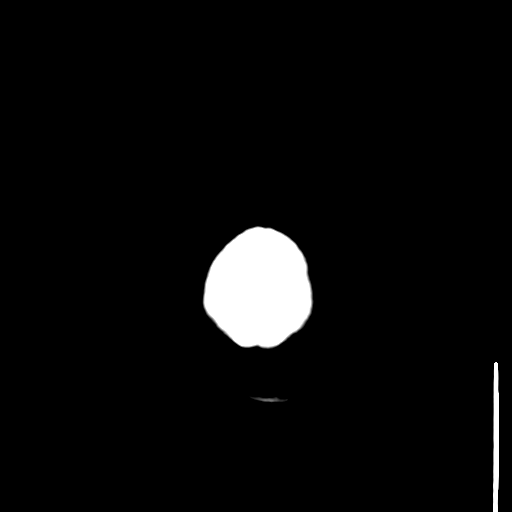
[im 27/29  bone]
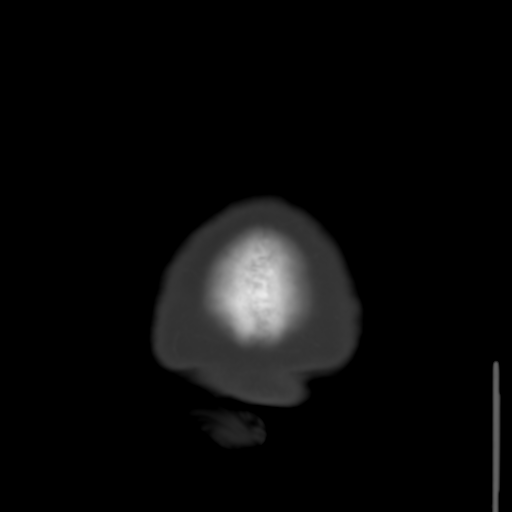

[Series 4: coronal soft tissue · coronal · 0.29mm/px · 3 of 61 slices shown]
[im 21/61  brain]
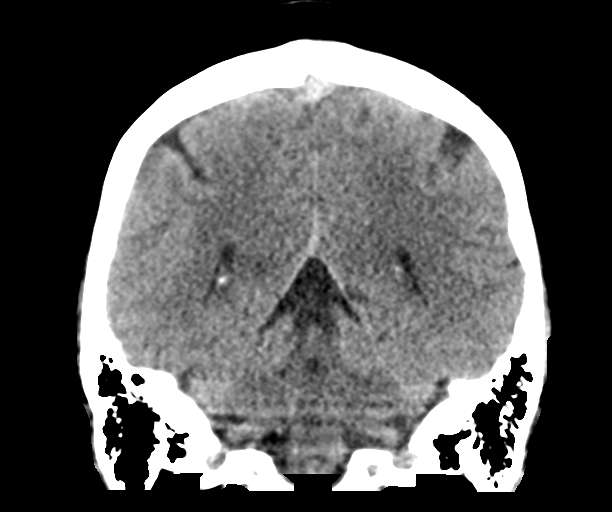
[im 27/61  brain]
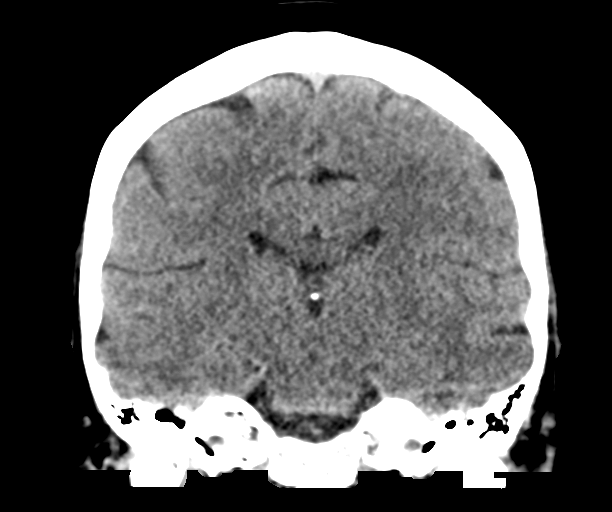
[im 34/61  brain]
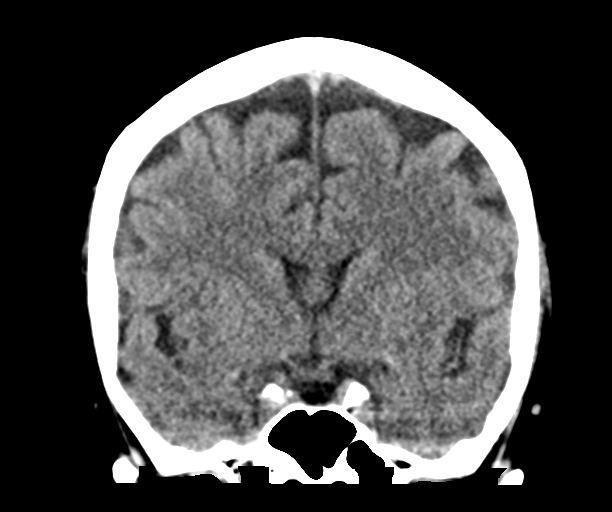

[Series 5: sagittal soft tissue · sagittal · 0.29mm/px · 3 of 47 slices shown]
[im 16/47  brain]
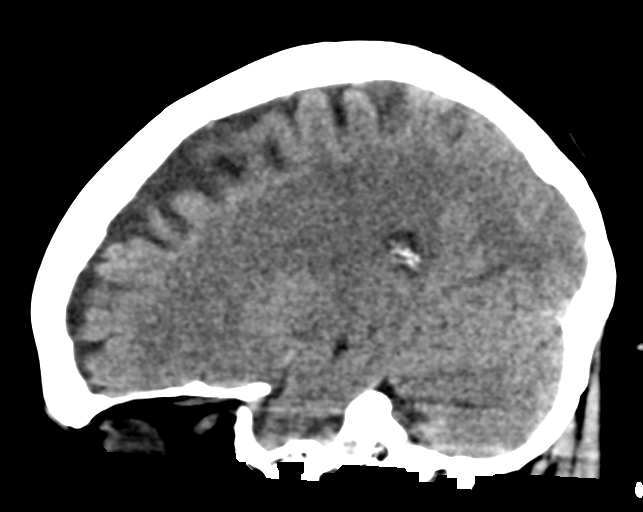
[im 24/47  brain]
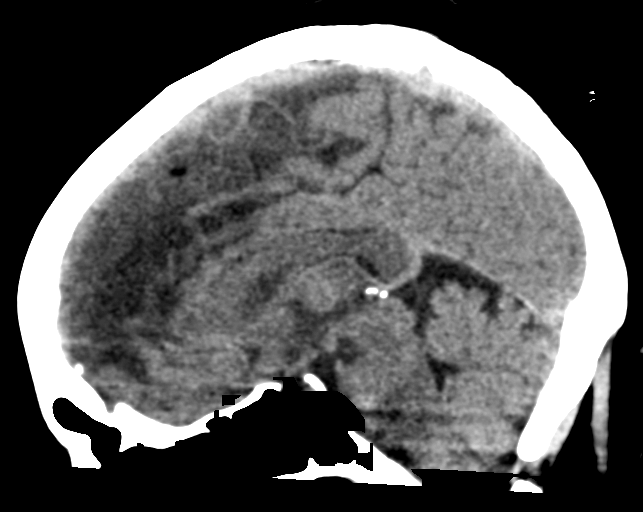
[im 31/47  brain]
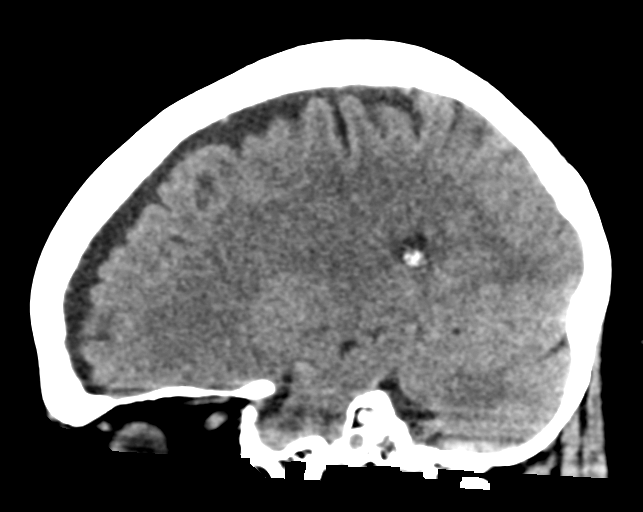

[15 of 46 positions shown; findings below may reference images not displayed]

FINDINGS: Brain: Brain volume is stable since [0Y] and within normal limits
for age. No midline shift, ventriculomegaly, mass effect, evidence
of mass lesion, intracranial hemorrhage or evidence of cortically
based acute infarction. Gray-white matter differentiation is within
normal limits for age.

Vascular: No suspicious intracranial vascular hyperdensity.

Skull: Stable osteopenia.  No acute osseous abnormality identified.

Sinuses/Orbits: Visualized paranasal sinuses and mastoids are stable
and well pneumatized. Tympanic cavities appear clear.

Other: Stable, negative orbit and scalp soft tissues.
IMPRESSION: Stable since [0Y] and negative for age noncontrast Head CT.

## 2020-02-28 IMAGING — MR MR HEAD WO/W CM
14 series · 43 of 48 positions shown · IV contrast (gadavist)
Comparison: Prior head CT from earlier the same day.

CLINICAL DATA: Initial evaluation for acute diplopia.

EXAM:
MRI HEAD WITHOUT AND WITH CONTRAST
TECHNIQUE: Multiplanar, multiecho pulse sequences of the brain and surrounding
structures were obtained without and with intravenous contrast.
CONTRAST:  10mL GADAVIST GADOBUTROL 1 MMOL/ML IV SOLN

[Series 5: ax dwi_tracew · axial · 3.0mm · 0.60mm/px · z∈[-109,+43]mm · 4 of 48 slices shown]
[im 1/48]
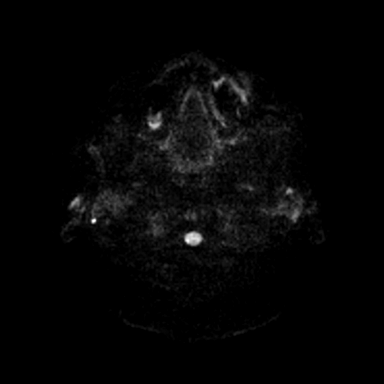
[im 16/48]
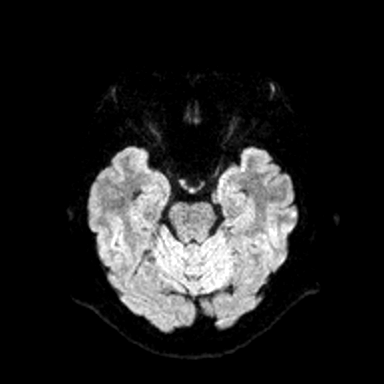
[im 32/48]
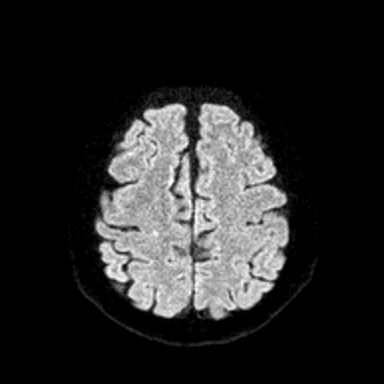
[im 48/48]
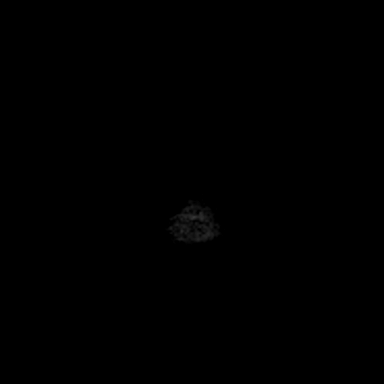

[Series 6: ax dwi_adc · axial · 3.0mm · 0.60mm/px · z∈[-109,+43]mm · 3 of 48 slices shown]
[im 1/48]
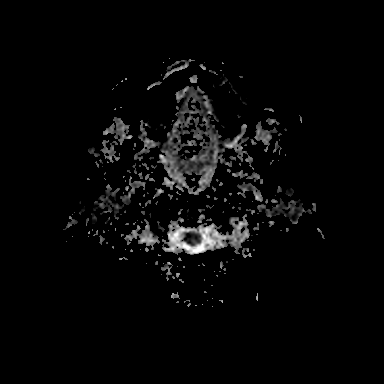
[im 24/48]
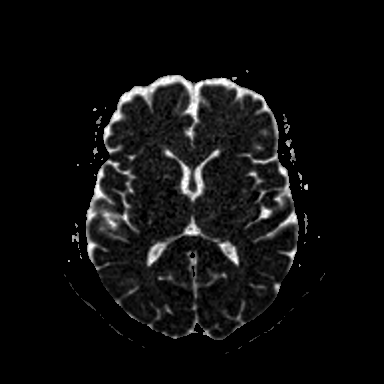
[im 48/48]
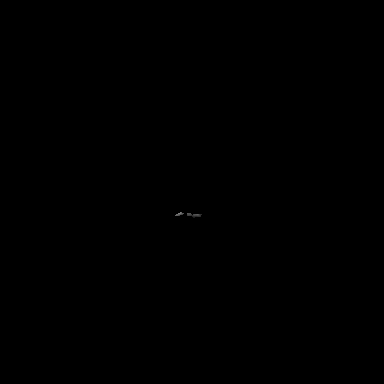

[Series 7: cor dwi_tracew · coronal · 5.0mm · 0.60mm/px · 2 of 38 slices shown]
[im 1/38]
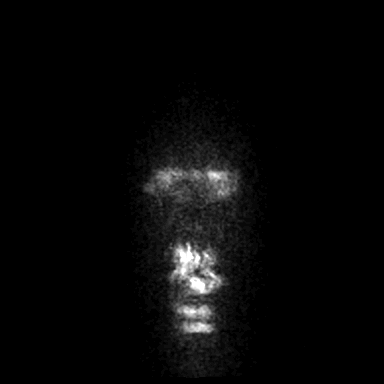
[im 38/38]
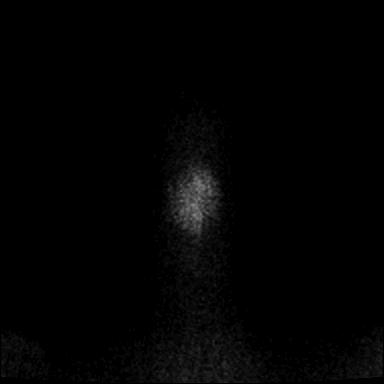

[Series 8: cor dwi_adc · coronal · 5.0mm · 0.60mm/px · 2 of 31 slices shown]
[im 1/31]
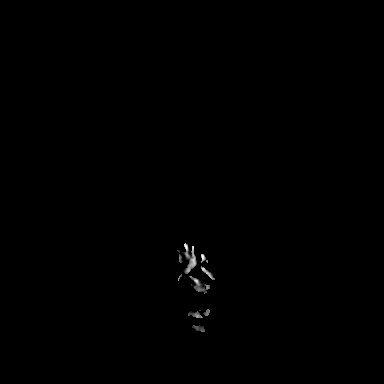
[im 31/31]
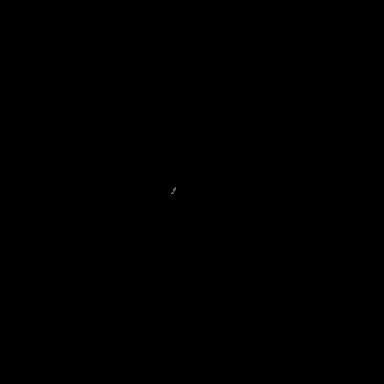

[Series 9: T1 · sagittal · 5.0mm · 0.62mm/px · 1 of 25 slices shown (1 of 2)]
[im 1/25]
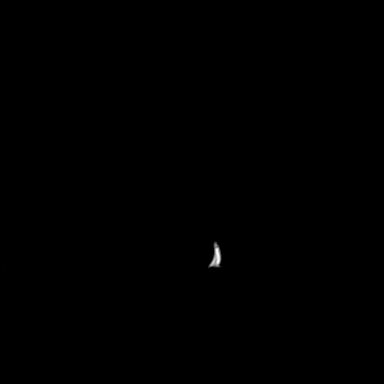

[Series 10: T2 · axial · 5.0mm · 0.53mm/px · z∈[-105,+41]mm · 2 of 26 slices shown (1 of 2)]
[im 1/26]
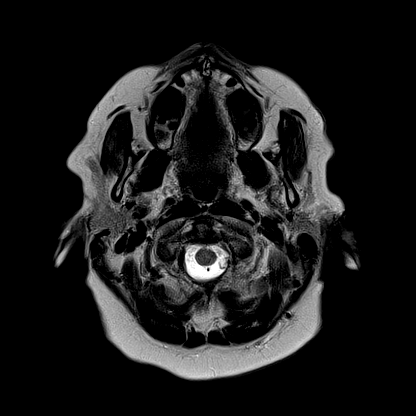
[im 26/26]
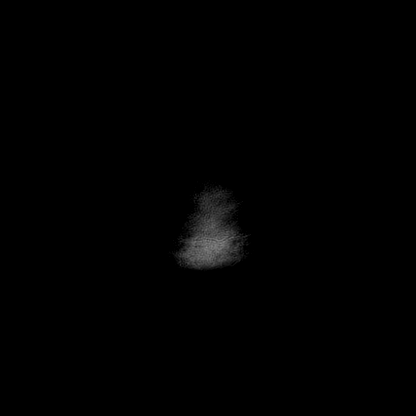

[Series 12: pha_images · axial · 3.0mm · 0.90mm/px · z∈[-119,+53]mm · 3 of 60 slices shown]
[im 1/60]
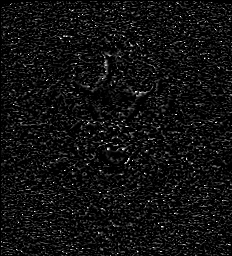
[im 30/60]
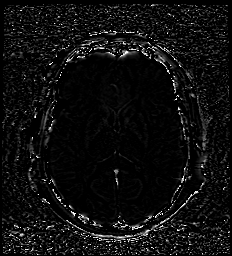
[im 60/60]
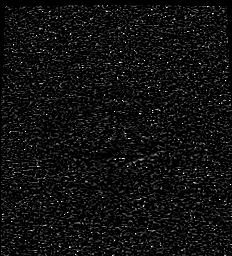

[Series 13: swi_images · axial · 3.0mm · 0.90mm/px · z∈[-119,-34]mm · 2 of 60 slices shown]
[im 1/60]
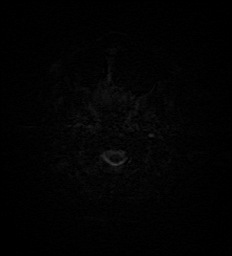
[im 30/60]
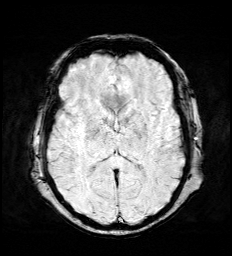

[Series 15: FLAIR · axial · 3.0mm · 0.53mm/px · z∈[-111,+47]mm · 3 of 55 slices shown]
[im 1/55]
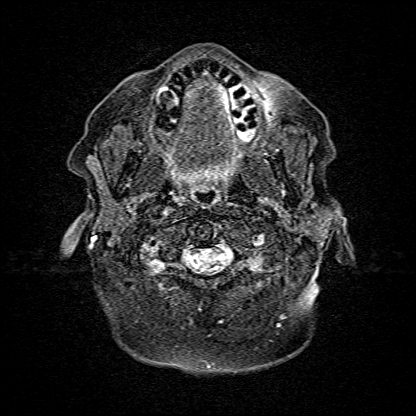
[im 28/55]
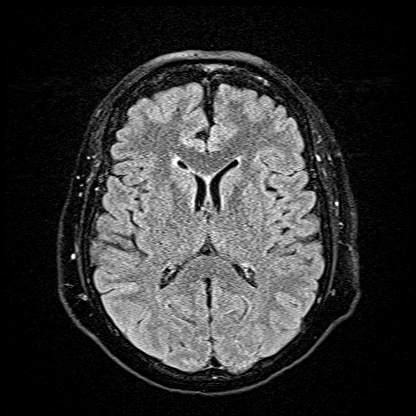
[im 55/55]
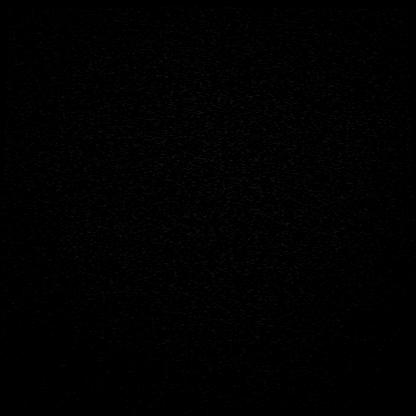

[Series 16: T1 · axial · 1.0mm · 0.98mm/px · z∈[-121,+50]mm · 8 of 175 slices shown (2 of 2)]
[im 1/175]
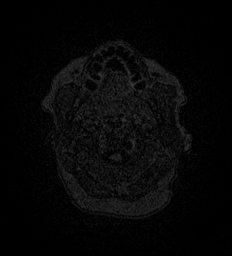
[im 20/175]
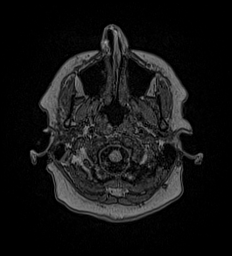
[im 59/175]
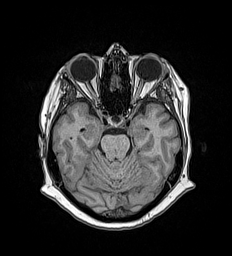
[im 78/175]
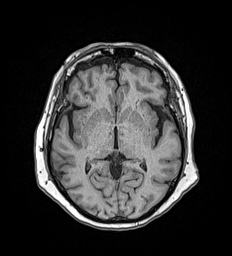
[im 97/175]
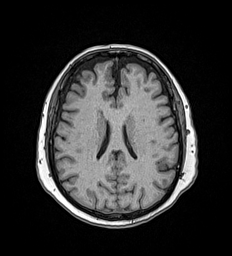
[im 117/175]
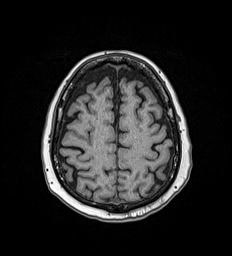
[im 155/175]
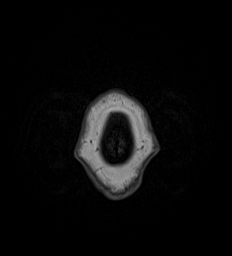
[im 175/175]
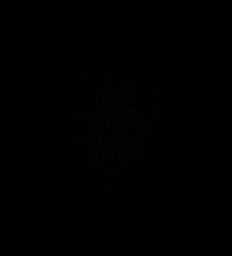

[Series 17: T2 · coronal · 5.0mm · 0.57mm/px · 2 of 29 slices shown (2 of 2)]
[im 1/29]
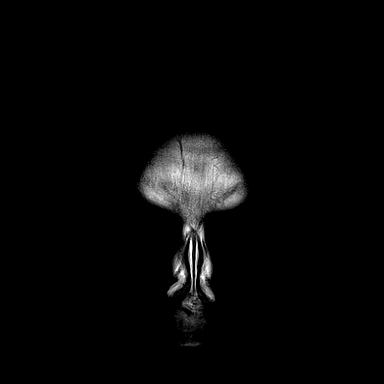
[im 29/29]
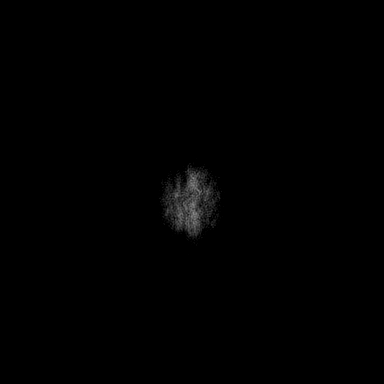

[Series 32: T1 post-contrast · axial · 1.0mm · 0.98mm/px · z∈[-121,+50]mm · 8 of 174 slices shown (1 of 3)]
[im 1/174]
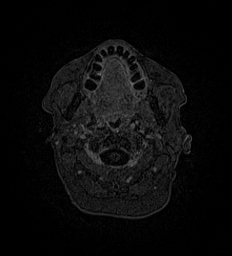
[im 20/174]
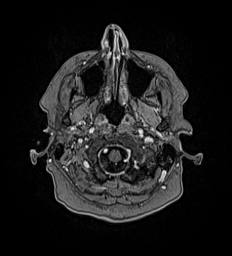
[im 58/174]
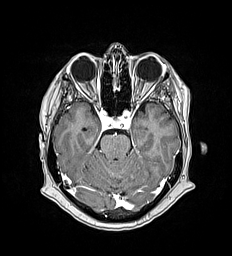
[im 77/174]
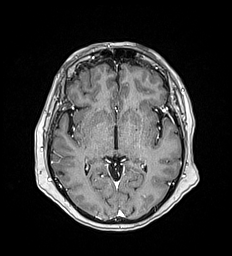
[im 97/174]
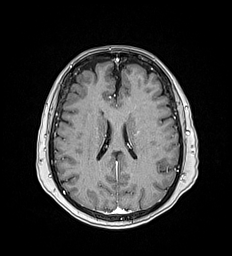
[im 116/174]
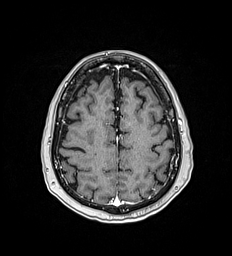
[im 154/174]
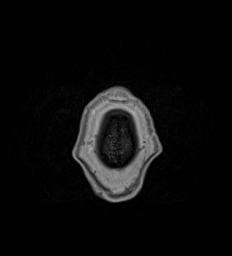
[im 174/174]
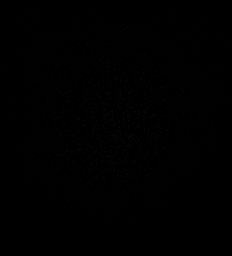

[Series 33: T1 post-contrast · coronal · 5.0mm · 0.57mm/px · 2 of 29 slices shown (2 of 3)]
[im 1/29]
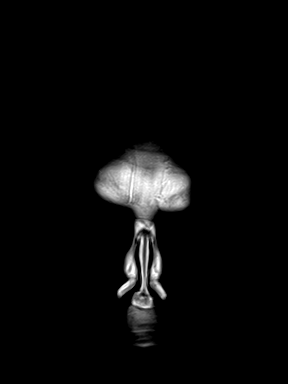
[im 29/29]
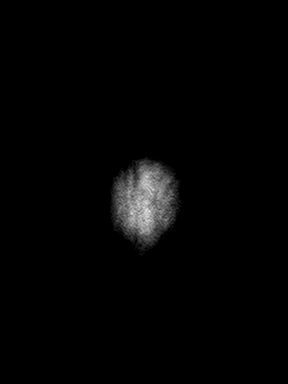

[Series 34: T1 post-contrast · sagittal · 5.0mm · 0.62mm/px · 1 of 25 slices shown (3 of 3)]
[im 1/25]
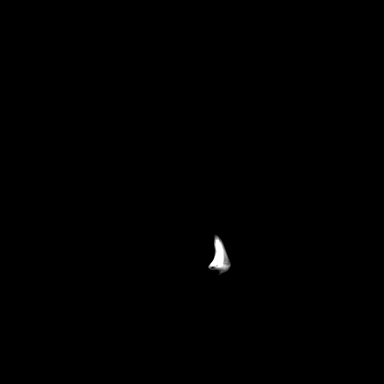

[43 of 48 positions shown; findings below may reference images not displayed]

FINDINGS: Brain: Volume cerebral volume within normal limits for age. Minimal
scattered patchy T2/FLAIR hyperintensity within the periventricular
white matter and pons, most like related chronic microvascular
ischemic disease, mild for age.

There are a few scattered punctate foci of diffusion abnormality
seen involving the cortical/subcortical aspects of both
parieto-occipital regions (series 5, images 29, 24, 20, 18),
consistent with tiny acute to subacute ischemic infarcts. Associated
minimal petechial hemorrhage (series 13, image 25). Subtle
associated postcontrast enhancement consistent with subacute
ischemia (series 32, images 113, 81). No malignant hemorrhagic
transformation or significant regional mass effect. No evidence for
subarachnoid hemorrhage within this region on prior head CT.

No other diffusion abnormality to suggest acute or subacute
ischemia. Gray-white matter differentiation otherwise maintained. No
other imaging findings of PRES. No other areas of encephalomalacia
to suggest chronic cortical infarction. No other evidence for acute
or chronic intracranial hemorrhage.

No mass lesion, midline shift or mass effect. Ventricles normal size
without hydrocephalus. No extra-axial fluid collection. Pituitary
gland suprasellar region normal. Midline structures intact.

No other abnormal enhancement.

Vascular: Major intracranial vascular flow voids are grossly
maintained.

Skull and upper cervical spine: Craniocervical junction within
normal limits. Bone marrow signal intensity normal. No scalp soft
tissue abnormality.

Sinuses/Orbits: Patient status post bilateral ocular lens
replacement. Globes and orbital soft tissues demonstrate no acute
finding. Paranasal sinuses are clear. Trace left mastoid effusion
noted, of doubtful significance. Inner ear structures grossly
normal.

Other: None.
IMPRESSION: 1. Few scattered punctate acute to subacute ischemic infarcts
involving the cortical/subcortical aspects of both parieto-occipital
regions. Associated minimal petechial hemorrhage without malignant
hemorrhagic transformation or significant regional mass effect.
2. No other acute intracranial abnormality.
3. Underlying mild chronic microvascular ischemic disease.

## 2020-02-28 MED ORDER — LORAZEPAM 0.5 MG PO TABS
0.5000 mg | ORAL_TABLET | Freq: Once | ORAL | Status: AC
  Administered 2020-02-28: 0.5 mg via ORAL
  Filled 2020-02-28: qty 1

## 2020-02-28 MED ORDER — GADOBUTROL 1 MMOL/ML IV SOLN
10.0000 mL | Freq: Once | INTRAVENOUS | Status: AC | PRN
  Administered 2020-02-28: 10 mL via INTRAVENOUS

## 2020-02-28 MED ORDER — ASPIRIN 81 MG PO CHEW
324.0000 mg | CHEWABLE_TABLET | Freq: Once | ORAL | Status: AC
  Administered 2020-02-28: 324 mg via ORAL
  Filled 2020-02-28: qty 4

## 2020-02-28 NOTE — ED Provider Notes (Signed)
Awaiting hospitalist call back. Signed out, Dr. Tamala Julian to discuss admission with hospitalist.   Delman Kitten, MD 02/28/20 2318

## 2020-02-28 NOTE — ED Provider Notes (Signed)
[facility] Emergency Department Provider Note   ____________________________________________   Event Date/Time   First MD Initiated Contact with Patient 02/28/20 1535     (approximate)  I have reviewed the triage vital signs and the nursing notes.   HISTORY  Chief Complaint Blurred Vision    HPI TYSA CAHALANE is a 70 y.o. female who over the last several months has had episodes of double vision that will come and go lasting a minute or 2.  Double vision seems to go away if she covers and I, but if she has both eyes open she will feel like she has double vision.  The symptoms have happened several times last few months and she saw her primary care had labs done and was told she may need an MRI.  Over the weekend on Saturday she had 3 or so episodes of this they last just a couple of minutes and then go away also seem like it was bit of a blurring of her vision associated with one of the episodes.  She denies any eye pain.  Her vision is back to normal now.  Symptoms will last for couple minutes.  No slurring of speech no muscle weakness.  No history of myasthenia.  Does have hypothyroidism is compliant without treatment and had her labs checked recently  No abdominal pain no chest pain shortness of breath no palpitations no chest pain.  No fevers or chills.  No associated headaches.  Has had previous cataract surgery, reports she saw her eye doctor this month seeing her up, optometrist he reported reassuring evaluation   Past Medical History:  Diagnosis Date  . Acute bronchospasm due to viral infection   . Anemia    history of  . Depressive disorder   . Epistaxis   . Fatigue   . Hearing loss   . Hypothyroidism   . Memory change   . Migraine   . Numbness and tingling   . Obesity (BMI 30.0-34.9)   . Osteoarthritis   . Osteoporosis   . Polyuria   . Premature menopause   . Seizures ([place])    history of mini seizures, possible migraine induced     Patient Active Problem List   Diagnosis Date Noted  . HPV test positive 07/08/2015  . Difficulty hearing 06/28/2015  . Arthritis, degenerative 06/28/2015  . Obesity, Class I, BMI 30-34.9 06/28/2015  . Paresthesia 06/28/2015  . Purpura, nonthrombopenic ([place]) 06/28/2015  . Skin lesion of right leg 06/28/2015  . Other specified hypothyroidism 08/09/2014  . Migraine without aura and without status migrainosus, not intractable 09/19/2008  . Moderate major depression ([place]) 09/07/2006  . OP (osteoporosis) 09/07/2006    Past Surgical History:  Procedure Laterality Date  . ABLATION  2006  . CATARACT EXTRACTION W/ INTRAOCULAR LENS IMPLANT Bilateral   . COLONOSCOPY    . COLONOSCOPY WITH PROPOFOL N/A 12/22/2018   Procedure: COLONOSCOPY WITH PROPOFOL;  Surgeon: Virgel Manifold, MD;  Location: [facility] ENDOSCOPY;  Service: Endoscopy;  Laterality: N/A;  . EYE SURGERY  Spring 2018   Cataracts both eyes  . TONSILLECTOMY      Prior to Admission medications   Medication Sig Start Date End Date Taking? Authorizing Provider  buPROPion (WELLBUTRIN SR) 150 MG 12 hr tablet Take 150 mg by mouth 2 (two) times daily.    Lew Dawes, MD  citalopram (CELEXA) 40 MG tablet Take 40 mg by mouth daily.    Lew Dawes, MD  clindamycin-benzoyl  peroxide (BENZACLIN) gel Apply topically 2 (two) times daily. 05/10/19   [name], MD  Cyanocobalamin (VITAMIN B-12) 1000 MCG SUBL Place under the tongue.    [provider]  levothyroxine (SYNTHROID) 75 MCG tablet Take 1 tablet (75 mcg total) by mouth daily before breakfast. 11/09/19   [name], MD  Multiple Vitamins-Minerals (MULTIVITAMIN WOMEN 50+ PO) Take 1 tablet by mouth daily.    [provider]  Omega-3 Fatty Acids (FISH OIL) 1000 MG CAPS Take 1 capsule by mouth daily.    [provider]  SUMAtriptan (IMITREX) 100 MG tablet May repeat in 2 hours if headache persists or recurs. 11/09/19   [name], MD   topiramate (TOPAMAX) 25 MG tablet Take 25 mg by mouth daily.     Lew Dawes, MD  Vitamin D, Ergocalciferol, (DRISDOL) 1.25 MG (50000 UNIT) CAPS capsule TAKE 1 CAPSULE BY MOUTH EVERY 7 DAYS 09/20/19   [name], MD    Allergies Sulfamethoxazole-trimethoprim  Family History  Problem Relation Age of Onset  . Early death Father        suicide  . Depression Father   . Diabetes Father   . Cancer Father        prostate  . Hearing loss Father        due to war  . Alzheimer's disease Mother   . Heart disease Brother   . Heart disease Maternal Aunt   . Dementia Maternal Aunt   . Heart disease Maternal Uncle   . Dementia Maternal Grandmother   . Heart disease Brother     Social History Social History   Tobacco Use  . Smoking status: Former Smoker    Packs/day: 0.50    Years: 6.00    Pack years: 3.00    Types: Cigarettes    Quit date: 02/11/1974    Years since quitting: 46.0  . Smokeless tobacco: Never Used  Vaping Use  . Vaping Use: Never used  Substance Use Topics  . Alcohol use: Yes    Alcohol/week: 0.0 standard drinks    Comment: 2-3 drinks monthly  . Drug use: Never    Review of Systems Constitutional: No fever/chills Eyes: see HPI ENT: No sore throat. Cardiovascular: Denies chest pain. Respiratory: Denies shortness of breath. Gastrointestinal: No abdominal pain.   Genitourinary: Negative for dysuria. Musculoskeletal: Negative for back pain. Skin: Negative for rash. Neurological: Negative for headaches, areas of focal weakness or numbness.    ____________________________________________   PHYSICAL EXAM:  VITAL SIGNS: ED Triage Vitals  Enc Vitals Group     BP 02/28/20 0947 129/90     Pulse Rate 02/28/20 0947 77     Resp 02/28/20 0947 18     Temp 02/28/20 0947 98 F (36.7 C)     Temp Source 02/28/20 1055 Oral     SpO2 02/28/20 0947 100 %     Weight --      Height --      Head Circumference --      Peak Flow --      Pain Score 02/28/20  0947 0     Pain Loc --      Pain Edu? --      Excl. in [place]? --     Constitutional: Alert and oriented. Well appearing and in no acute distress. Eyes: Conjunctivae are normal. Head: Atraumatic.  Extraocular movements are normal.  Pupils reactive with normal constriction round and reactive to light bilateral.  No visual deficits at this time.  Reports normal visual fields. Nose: No congestion/rhinnorhea. Mouth/Throat: Mucous membranes are moist. Neck: No stridor.  Cardiovascular: Normal rate, regular rhythm. Grossly normal heart sounds.  Good peripheral circulation. Respiratory: Normal respiratory effort.  No retractions. Lungs CTAB. Gastrointestinal: Soft and nontender. No distention. Musculoskeletal: No lower extremity tenderness nor edema.  5 out of 5 strength in all extremities.  No pronator drift bilateral.  Normal and clear speech. Neurologic:  Normal speech and language. No gross focal neurologic deficits are appreciated.  Occasionally will have just a slight defect in trying to recall the word, but generally speaks fluently and she reports this seems to be an issue that is been worsening over the last several months as well she is talk to her primary care doctor about the symptoms such as dementia, does not have a history per se Skin:  Skin is warm, dry and intact. No rash noted. Psychiatric: Mood and affect are normal. Speech and behavior are normal.  ____________________________________________   LABS (all labs ordered are listed, but only abnormal results are displayed)  Labs Reviewed  CBC - Abnormal; Notable for the following components:      Result Value   Platelets 108 (*)    All other components within normal limits  BASIC METABOLIC PANEL - Abnormal; Notable for the following components:   Glucose, Bld 104 (*)    All other components within normal limits  SARS CORONAVIRUS 2 (TAT 6-24 HRS)   ____________________________________________  EKG  Reviewed interpreted at  10 AM Heart rate 70 QRS 110 QTc 410 Normal sinus rhythm, probable old anterior infarct.  Possible LVH.  No evidence of acute ischemia denoted ____________________________________________  RADIOLOGY  CT Head Wo Contrast  Result Date: 02/28/2020 CLINICAL DATA:  70 year old female with dizziness and vision changes for 24 hours. TIA. EXAM: CT HEAD WITHOUT CONTRAST TECHNIQUE: Contiguous axial images were obtained from the base of the skull through the vertex without intravenous contrast. COMPARISON:  Head CT 12/20/2017. FINDINGS: Brain: Brain volume is stable since 2019 and within normal limits for age. No midline shift, ventriculomegaly, mass effect, evidence of mass lesion, intracranial hemorrhage or evidence of cortically based acute infarction. Gray-white matter differentiation is within normal limits for age. Vascular: No suspicious intracranial vascular hyperdensity. Skull: Stable osteopenia.  No acute osseous abnormality identified. Sinuses/Orbits: Visualized paranasal sinuses and mastoids are stable and well pneumatized. Tympanic cavities appear clear. Other: Stable, negative orbit and scalp soft tissues. IMPRESSION: Stable since 2019 and negative for age noncontrast Head CT. Electronically Signed   By: Genevie Ann M.D.   On: 02/28/2020 11:53   MR Angiogram Neck W or Wo Contrast  Result Date: 02/28/2020 CLINICAL DATA:  Initial evaluation for intermittent visual disturbance, double vision. EXAM: MRA NECK WITHOUT AND WITH CONTRAST TECHNIQUE: Multiplanar and multiecho pulse sequences of the neck were obtained without and with intravenous contrast. Angiographic images of the neck were obtained using MRA technique without and with intravenous contrast. CONTRAST:  9mL GADAVIST GADOBUTROL 1 MMOL/ML IV SOLN COMPARISON:  Comparison made with corresponding brain MRI performed on the same day. FINDINGS: AORTIC ARCH: Examination mildly degraded by motion artifact. Visualized aortic arch of normal caliber with  normal 3 vessel morphology. No hemodynamically significant stenosis seen about the origin of the great vessels. Visualized subclavian arteries widely patent. RIGHT CAROTID SYSTEM: Right CCA patent from its origin to the bifurcation without stenosis. No significant atheromatous irregularity or stenosis about the right carotid bifurcation. Right ICA patent distally without stenosis, evidence for dissection, or occlusion. LEFT  CAROTID SYSTEM: Left CCA patent from its origin to the bifurcation without stenosis. No significant atheromatous irregularity or stenosis about the left bifurcation. Left ICA patent distally without stenosis, evidence for dissection or occlusion. VERTEBRAL ARTERIES: Both vertebral arteries arise from the subclavian arteries. Neither vertebral artery origin well visualized due to motion artifact. Right vertebral artery dominant. Visualized vertebral arteries patent within the neck without stenosis, evidence for dissection or occlusion. Diminutive left vertebral artery appears to largely terminate in PICA, although a tiny branch ascending towards the vertebrobasilar junction. IMPRESSION: Normal MRA of the neck. No hemodynamically significant stenosis or other acute vascular abnormality. Electronically Signed   By: Jeannine Boga M.D.   On: 02/28/2020 22:34   MR Brain W and Wo Contrast  Result Date: 02/28/2020 CLINICAL DATA:  Initial evaluation for acute diplopia. EXAM: MRI HEAD WITHOUT AND WITH CONTRAST TECHNIQUE: Multiplanar, multiecho pulse sequences of the brain and surrounding structures were obtained without and with intravenous contrast. CONTRAST:  11mL GADAVIST GADOBUTROL 1 MMOL/ML IV SOLN COMPARISON:  Prior head CT from earlier the same day. FINDINGS: Brain: Volume cerebral volume within normal limits for age. Minimal scattered patchy T2/FLAIR hyperintensity within the periventricular white matter and pons, most like related chronic microvascular ischemic disease, mild for age.  There are a few scattered punctate foci of diffusion abnormality seen involving the cortical/subcortical aspects of both parieto-occipital regions (series 5, images 29, 24, 20, 18), consistent with tiny acute to subacute ischemic infarcts. Associated minimal petechial hemorrhage (series 13, image 25). Subtle associated postcontrast enhancement consistent with subacute ischemia (series 32, images 113, 81). No malignant hemorrhagic transformation or significant regional mass effect. No evidence for subarachnoid hemorrhage within this region on prior head CT. No other diffusion abnormality to suggest acute or subacute ischemia. Gray-white matter differentiation otherwise maintained. No other imaging findings of PRES. No other areas of encephalomalacia to suggest chronic cortical infarction. No other evidence for acute or chronic intracranial hemorrhage. No mass lesion, midline shift or mass effect. Ventricles normal size without hydrocephalus. No extra-axial fluid collection. Pituitary gland suprasellar region normal. Midline structures intact. No other abnormal enhancement. Vascular: Major intracranial vascular flow voids are grossly maintained. Skull and upper cervical spine: Craniocervical junction within normal limits. Bone marrow signal intensity normal. No scalp soft tissue abnormality. Sinuses/Orbits: Patient status post bilateral ocular lens replacement. Globes and orbital soft tissues demonstrate no acute finding. Paranasal sinuses are clear. Trace left mastoid effusion noted, of doubtful significance. Inner ear structures grossly normal. Other: None. IMPRESSION: 1. Few scattered punctate acute to subacute ischemic infarcts involving the cortical/subcortical aspects of both parieto-occipital regions. Associated minimal petechial hemorrhage without malignant hemorrhagic transformation or significant regional mass effect. 2. No other acute intracranial abnormality. 3. Underlying mild chronic microvascular  ischemic disease. Electronically Signed   By: Jeannine Boga M.D.   On: 02/28/2020 22:27    Imaging reviewed including MRI which demonstrates scattered punctate acute to subacute ischemic infarcts.  Minimal petechial hemorrhages noted. ____________________________________________   PROCEDURES  Procedure(s) performed: None  Procedures  Critical Care performed: No  ____________________________________________   INITIAL IMPRESSION / ASSESSMENT AND PLAN / ED COURSE  Pertinent labs & imaging results that were available during my care of the patient were reviewed by me and considered in my medical decision making (see chart for details).   Patient presents for evaluation of several episodes of intermittent diplopia.  She reports that she will have double vision lasting sometimes for a minute or 2 and then also occasionally will have areas  where the like a blackening in her vision.  This occurs frequently while at rest.  She occasionally gets lightheaded when she is standing doing activities like showering or cooking meals but reports that issues been ongoing for a long time.  She saw her primary for the same issue and reports that it just seems to be slowly like increasing in frequency with regard to episodes of double vision.  I will order MRI of the brain MRI and MRA.  Patient had previously discussed getting the studies through primary care, but given the worsening of her symptoms I do wish to exclude critical stenosis or small stroke though her clinical exam seems to suggest against any acute neurologic infarct.  CT the head is reassuring  She denies any acute cardiac or pulmonary symptoms.  She is hemodynamically stable.  Her lab work reassuring and she did have recent TSH and is currently scheduled to follow-up with neurology in early February.  Has seen PCP for same.  Does not appear to exhibit obvious symptoms of myasthenia.  Does have a history of migraines, but no associated  headache with the symptoms.  Symptoms seem to represent double vision and not orthostatic or presyncopal symptoms.  Clinical Course as of 02/28/20 2316  Tue Feb 28, 2020  2008 Patient still waiting on MRI, MRI does report they will be getting the study shortly.  Patient updated by nurse and is understanding of the delay. [MQ]    Clinical Course User Index [MQ] Delman Kitten, MD   ----------------------------------------- 3:37 PM on 02/28/2020 -----------------------------------------  Reviewed note from PCP, patient seen in December for similar issues of intermittent double vision  ----------------------------------------- 11:17 PM on 02/28/2020 -----------------------------------------  Patient's MRI does demonstrate subacute to acute small ischemic stroke pattern.  Discussed with the patient we will admit her for further care and management of concerns for possible subacute stroke based on clinical history.  ____________________________________________   FINAL CLINICAL IMPRESSION(S) / ED DIAGNOSES  Final diagnoses:  Acute stroke due to ischemia [facility])        Note:  This document was prepared using Dragon voice recognition software and may include unintentional dictation errors       Delman Kitten, MD 02/28/20 2317

## 2020-02-28 NOTE — ED Triage Notes (Addendum)
Pt comes with c/o b=double vision. Pt states she has had several episodes of this recently. Pt states it happens and then subsides. Pt denies any slurred speech or weakness.  Pt states this past weekend she had 3 episodes on Saturday. Pt states she then got dizzy while sitting down.  Pt states then when she stands she is dizzy and almost passed out.  Pt denies any current blurred or double vision.

## 2020-02-29 ENCOUNTER — Encounter: Payer: Self-pay | Admitting: Family Medicine

## 2020-02-29 ENCOUNTER — Observation Stay: Admit: 2020-02-29 | Payer: Medicare Other

## 2020-02-29 ENCOUNTER — Observation Stay
Admit: 2020-02-29 | Discharge: 2020-02-29 | Disposition: A | Discharge status: Home / Self Care | Payer: Medicare Other | Attending: Internal Medicine | Admitting: Internal Medicine

## 2020-02-29 ENCOUNTER — Encounter: Payer: Self-pay | Admitting: Internal Medicine

## 2020-02-29 ENCOUNTER — Observation Stay: Payer: Medicare Other

## 2020-02-29 DIAGNOSIS — Z79890 Hormone replacement therapy (postmenopausal): Secondary | ICD-10-CM | POA: Diagnosis not present

## 2020-02-29 DIAGNOSIS — Z87891 Personal history of nicotine dependence: Secondary | ICD-10-CM | POA: Diagnosis not present

## 2020-02-29 DIAGNOSIS — Z79899 Other long term (current) drug therapy: Secondary | ICD-10-CM | POA: Diagnosis not present

## 2020-02-29 DIAGNOSIS — M810 Age-related osteoporosis without current pathological fracture: Secondary | ICD-10-CM | POA: Diagnosis present

## 2020-02-29 DIAGNOSIS — D6959 Other secondary thrombocytopenia: Secondary | ICD-10-CM | POA: Diagnosis present

## 2020-02-29 DIAGNOSIS — H532 Diplopia: Secondary | ICD-10-CM | POA: Diagnosis present

## 2020-02-29 DIAGNOSIS — Z8673 Personal history of transient ischemic attack (TIA), and cerebral infarction without residual deficits: Secondary | ICD-10-CM | POA: Diagnosis present

## 2020-02-29 DIAGNOSIS — E038 Other specified hypothyroidism: Secondary | ICD-10-CM | POA: Diagnosis present

## 2020-02-29 DIAGNOSIS — I10 Essential (primary) hypertension: Secondary | ICD-10-CM | POA: Diagnosis present

## 2020-02-29 DIAGNOSIS — I771 Stricture of artery: Secondary | ICD-10-CM | POA: Diagnosis not present

## 2020-02-29 DIAGNOSIS — D696 Thrombocytopenia, unspecified: Secondary | ICD-10-CM | POA: Diagnosis not present

## 2020-02-29 DIAGNOSIS — I63539 Cerebral infarction due to unspecified occlusion or stenosis of unspecified posterior cerebral artery: Secondary | ICD-10-CM | POA: Diagnosis present

## 2020-02-29 DIAGNOSIS — I639 Cerebral infarction, unspecified: Principal | ICD-10-CM

## 2020-02-29 DIAGNOSIS — G43009 Migraine without aura, not intractable, without status migrainosus: Secondary | ICD-10-CM | POA: Diagnosis present

## 2020-02-29 DIAGNOSIS — F321 Major depressive disorder, single episode, moderate: Secondary | ICD-10-CM | POA: Diagnosis present

## 2020-02-29 DIAGNOSIS — R29701 NIHSS score 1: Secondary | ICD-10-CM | POA: Diagnosis present

## 2020-02-29 DIAGNOSIS — R29818 Other symptoms and signs involving the nervous system: Secondary | ICD-10-CM | POA: Diagnosis not present

## 2020-02-29 DIAGNOSIS — Z7982 Long term (current) use of aspirin: Secondary | ICD-10-CM | POA: Diagnosis not present

## 2020-02-29 DIAGNOSIS — Z20822 Contact with and (suspected) exposure to covid-19: Secondary | ICD-10-CM | POA: Diagnosis present

## 2020-02-29 LAB — ECHOCARDIOGRAM COMPLETE
AR max vel: 2.92 cm2
AV Area VTI: 3.2 cm2
AV Area mean vel: 2.79 cm2
AV Mean grad: 3 mmHg
AV Peak grad: 5.9 mmHg
Ao pk vel: 1.21 m/s
Area-P 1/2: 3.21 cm2
S' Lateral: 3.11 cm

## 2020-02-29 LAB — LIPID PANEL
Cholesterol: 165 mg/dL (ref 0–200)
HDL: 38 mg/dL — ABNORMAL LOW (ref >40)
LDL Cholesterol: 108 mg/dL — ABNORMAL HIGH (ref 0–99)
Total CHOL/HDL Ratio: 4.3 RATIO
Triglycerides: 97 mg/dL (ref <150)
VLDL: 19 mg/dL (ref 0–40)

## 2020-02-29 LAB — HIV ANTIBODY (ROUTINE TESTING W REFLEX): HIV Screen 4th Generation wRfx: NONREACTIVE

## 2020-02-29 LAB — HEMOGLOBIN A1C
Hgb A1c MFr Bld: 5 % (ref 4.8–5.6)
Mean Plasma Glucose: 96.8 mg/dL

## 2020-02-29 LAB — SARS CORONAVIRUS 2 (TAT 6-24 HRS): SARS Coronavirus 2: NEGATIVE

## 2020-02-29 IMAGING — CT CT ANGIO HEAD
1 of 10 series · 6 of 35 positions shown · IV contrast (APPLIED)
Comparison: MRA neck [DATE]. MRI brain [DATE]. Head CT
[DATE].

CLINICAL DATA: Neuro deficit, acute, stroke suspected. Additional
provided: Patient with intermittent double vision for the past
several months.

EXAM:
CT ANGIOGRAPHY HEAD AND NECK
TECHNIQUE: Multidetector CT imaging of the head and neck was performed using
the standard protocol during bolus administration of intravenous
contrast. Multiplanar CT image reconstructions and MIPs were
obtained to evaluate the vascular anatomy. Carotid stenosis
measurements (when applicable) are obtained utilizing NASCET
criteria, using the distal internal carotid diameter as the
denominator.
CONTRAST:  75mL OMNIPAQUE IOHEXOL 350 MG/ML SOLN

[Series 10: ax thin · axial · 0.39mm/px · z∈[-297,-69]mm · 6 of 320 slices shown]
[im 46/320  soft-tissue]
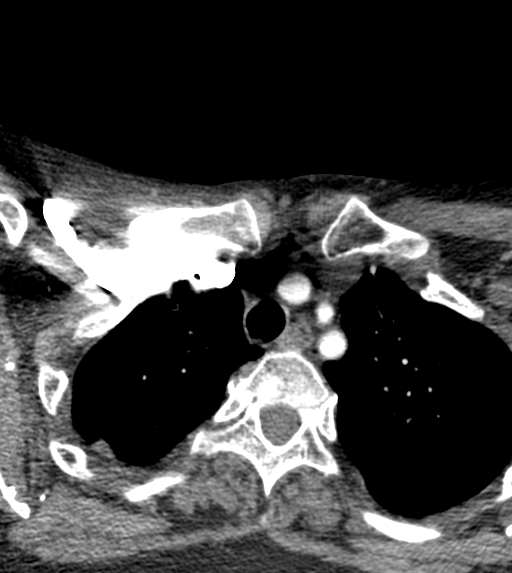
[im 92/320  bone]
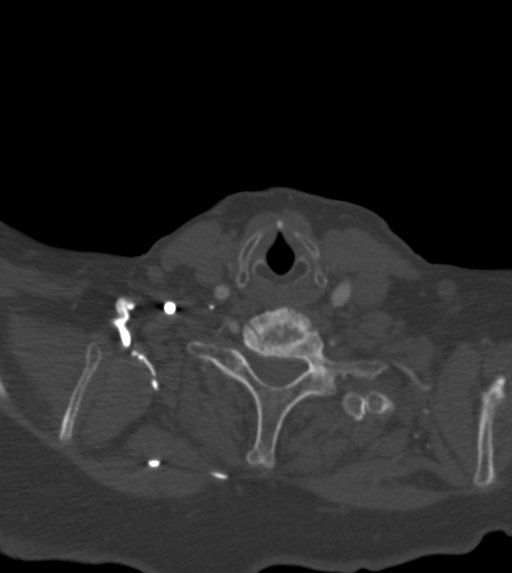
[im 137/320  soft-tissue]
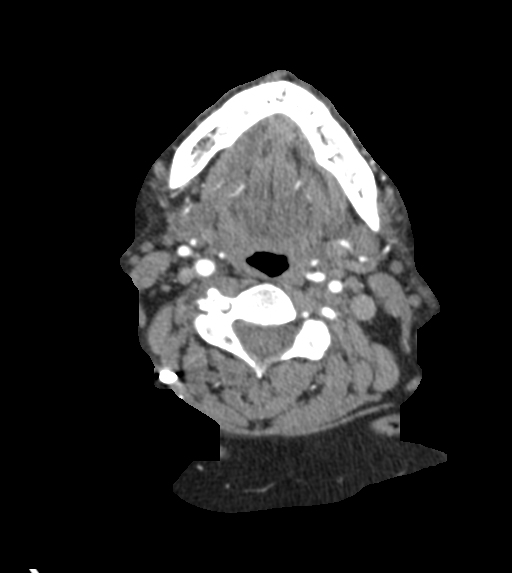
[im 183/320  bone]
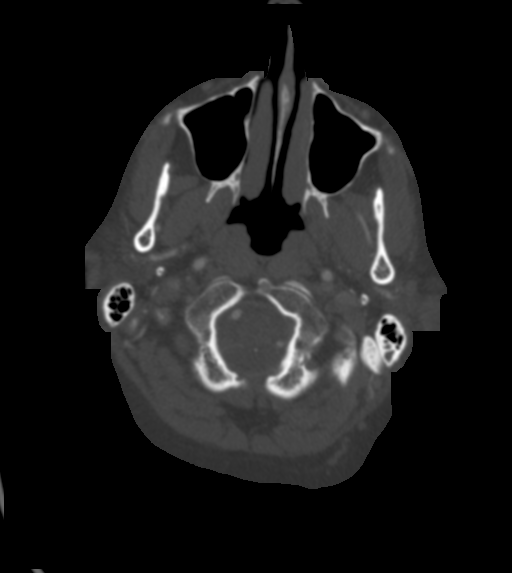
[im 228/320  soft-tissue]
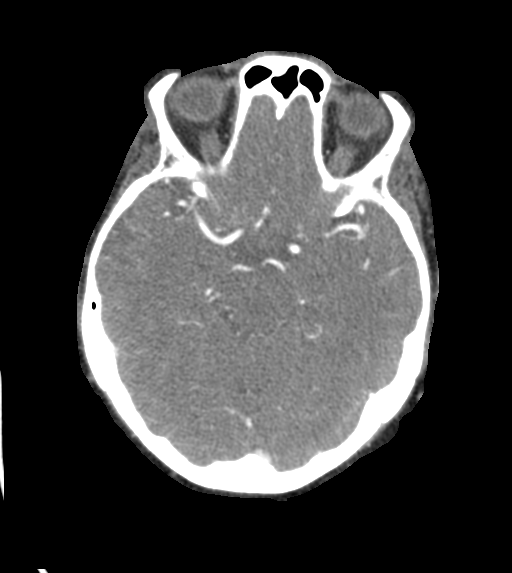
[im 274/320  bone]
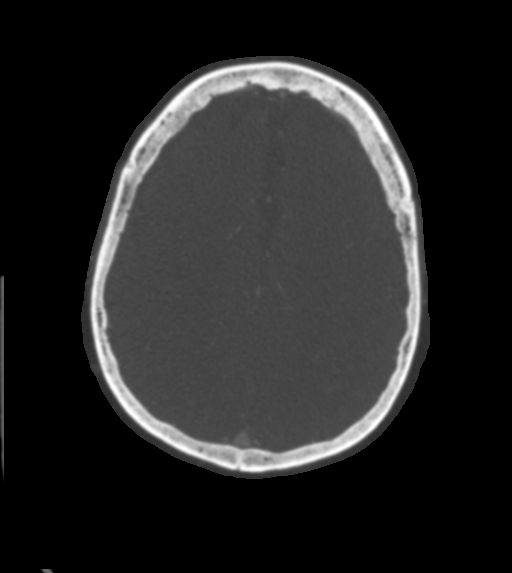

[6 of 35 positions shown; findings below may reference images not displayed]

FINDINGS: CT HEAD FINDINGS

Brain:

Cerebral volume is normal.

A few punctate acute/subacute infarcts or present within the
bilateral parietooccipital lobes on the MRI of [DATE]. No
evidence of interval infarct.

There is no acute intracranial hemorrhage.

No extra-axial fluid collection.

No evidence of intracranial mass.

No midline shift.

Vascular: Reported below.

Skull: Normal. Negative for fracture or focal lesion.

Sinuses: No significant paranasal sinus disease.

Orbits: No mass or acute finding.

Review of the MIP images confirms the above findings

CTA NECK FINDINGS

Aortic arch: Standard aortic branching. The visualized aortic arch
is unremarkable. No hemodynamically significant innominate or
proximal subclavian artery stenosis.

Right carotid system: CCA and ICA patent within the neck without
stenosis. No significant atherosclerotic disease. Tortuosity of the
cervical ICA.

Left carotid system: CCA and ICA patent within neck without
stenosis. No significant atherosclerotic disease. Tortuosity of the
cervical ICA.

Vertebral arteries: Patent within the neck without stenosis. The
right vertebral artery is dominant.

Skeleton: No acute bony abnormality or aggressive osseous lesion.
Cervical spondylosis with multilevel disc space narrowing, disc
bulges, posterior disc osteophytes and uncovertebral hypertrophy.

Other neck: No neck mass or cervical lymphadenopathy. Thyroid
unremarkable.

Upper chest: No consolidation within the imaged lung apices.

Review of the MIP images confirms the above findings

CTA HEAD FINDINGS

Anterior circulation:

The intracranial internal carotid arteries are patent. The M1 middle
cerebral arteries are patent. No M2 proximal branch occlusion or
high-grade proximal stenosis is identified. The anterior cerebral
arteries are patent. No intracranial aneurysm is identified.

Posterior circulation:

The dominant intracranial right vertebral artery is patent without
stenosis. The non dominant intracranial left vertebral artery is
developmentally diminutive and terminates predominantly as the left
PICA, with only a small contribution to the basilar artery. The
basilar artery is patent. The posterior cerebral arteries are
patent. A left posterior communicating artery is present. The right
posterior communicating artery is hypoplastic or absent.

Venous sinuses: Within the limitations of contrast timing, no
convincing thrombus. Large arachnoid granulation within the distal
right transverse dural venous sinus.

Anatomic variants: As described

Review of the MIP images confirms the above findings
IMPRESSION: CT head:

No CT evidence of acute intracranial abnormality. A few punctate
acute/subacute infarcts were noted within the bilateral
parietooccipital lobes on the recent prior MRI of [DATE].

CTA neck:

The common carotid, internal carotid and vertebral arteries are
patent within the neck without stenosis. No significant
atherosclerotic disease.

CTA head:

No intracranial large vessel occlusion or proximal high-grade
arterial stenosis.

## 2020-02-29 IMAGING — CT CT ANGIO NECK
1 of 10 series · 6 of 35 positions shown · IV contrast (APPLIED)
Comparison: MRA neck [DATE]. MRI brain [DATE]. Head CT
[DATE].

CLINICAL DATA: Neuro deficit, acute, stroke suspected. Additional
provided: Patient with intermittent double vision for the past
several months.

EXAM:
CT ANGIOGRAPHY HEAD AND NECK
TECHNIQUE: Multidetector CT imaging of the head and neck was performed using
the standard protocol during bolus administration of intravenous
contrast. Multiplanar CT image reconstructions and MIPs were
obtained to evaluate the vascular anatomy. Carotid stenosis
measurements (when applicable) are obtained utilizing NASCET
criteria, using the distal internal carotid diameter as the
denominator.
CONTRAST:  75mL OMNIPAQUE IOHEXOL 350 MG/ML SOLN

[Series 509: ax thin · axial · 0.39mm/px · z∈[-297,-69]mm · 6 of 320 slices shown]
[im 46/320  soft-tissue]
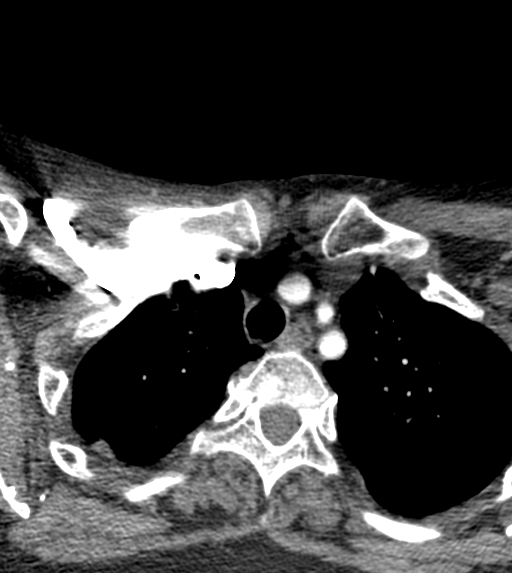
[im 92/320  bone]
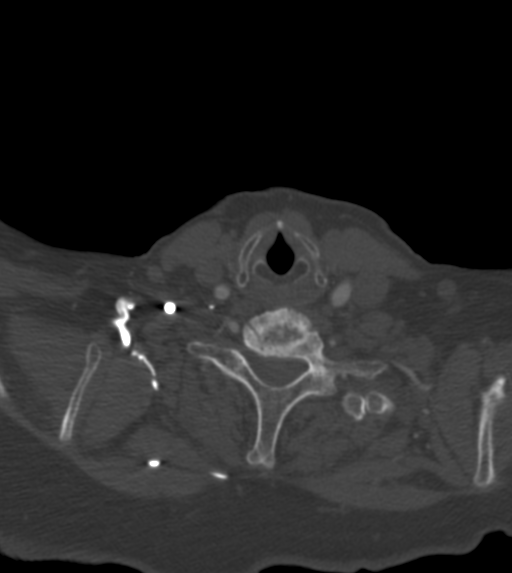
[im 137/320  soft-tissue]
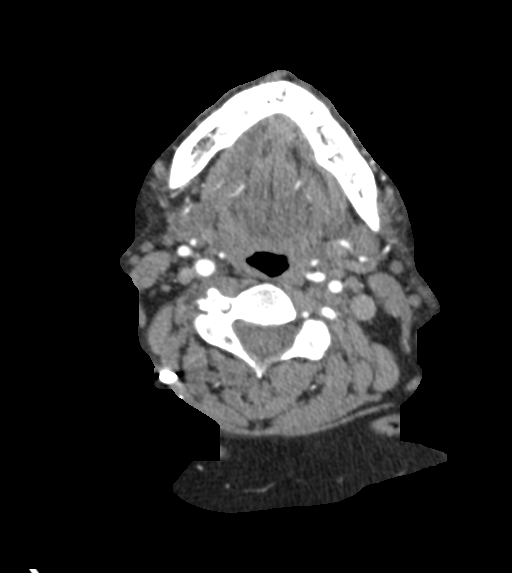
[im 183/320  bone]
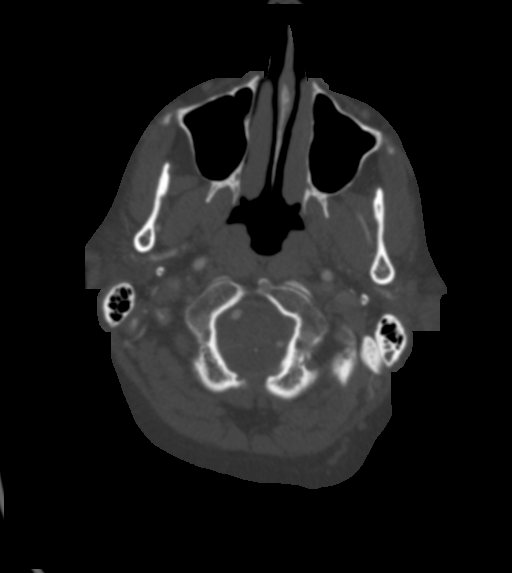
[im 228/320  soft-tissue]
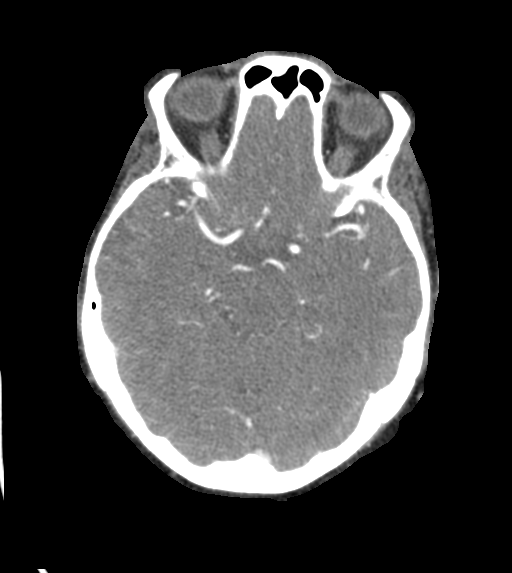
[im 274/320  bone]
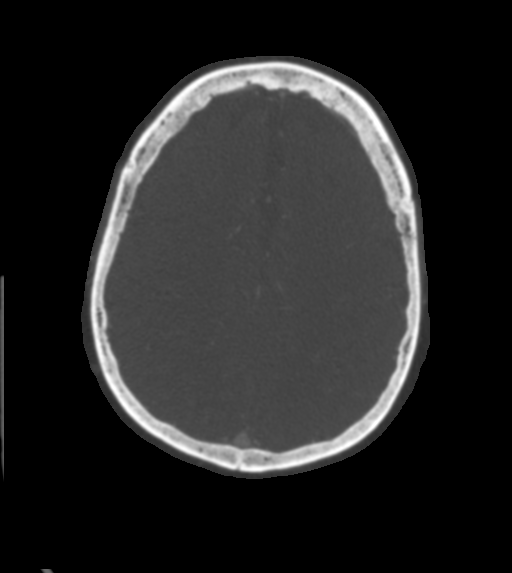

[6 of 35 positions shown; findings below may reference images not displayed]

FINDINGS: CT HEAD FINDINGS

Brain:

Cerebral volume is normal.

A few punctate acute/subacute infarcts or present within the
bilateral parietooccipital lobes on the MRI of [DATE]. No
evidence of interval infarct.

There is no acute intracranial hemorrhage.

No extra-axial fluid collection.

No evidence of intracranial mass.

No midline shift.

Vascular: Reported below.

Skull: Normal. Negative for fracture or focal lesion.

Sinuses: No significant paranasal sinus disease.

Orbits: No mass or acute finding.

Review of the MIP images confirms the above findings

CTA NECK FINDINGS

Aortic arch: Standard aortic branching. The visualized aortic arch
is unremarkable. No hemodynamically significant innominate or
proximal subclavian artery stenosis.

Right carotid system: CCA and ICA patent within the neck without
stenosis. No significant atherosclerotic disease. Tortuosity of the
cervical ICA.

Left carotid system: CCA and ICA patent within neck without
stenosis. No significant atherosclerotic disease. Tortuosity of the
cervical ICA.

Vertebral arteries: Patent within the neck without stenosis. The
right vertebral artery is dominant.

Skeleton: No acute bony abnormality or aggressive osseous lesion.
Cervical spondylosis with multilevel disc space narrowing, disc
bulges, posterior disc osteophytes and uncovertebral hypertrophy.

Other neck: No neck mass or cervical lymphadenopathy. Thyroid
unremarkable.

Upper chest: No consolidation within the imaged lung apices.

Review of the MIP images confirms the above findings

CTA HEAD FINDINGS

Anterior circulation:

The intracranial internal carotid arteries are patent. The M1 middle
cerebral arteries are patent. No M2 proximal branch occlusion or
high-grade proximal stenosis is identified. The anterior cerebral
arteries are patent. No intracranial aneurysm is identified.

Posterior circulation:

The dominant intracranial right vertebral artery is patent without
stenosis. The non dominant intracranial left vertebral artery is
developmentally diminutive and terminates predominantly as the left
PICA, with only a small contribution to the basilar artery. The
basilar artery is patent. The posterior cerebral arteries are
patent. A left posterior communicating artery is present. The right
posterior communicating artery is hypoplastic or absent.

Venous sinuses: Within the limitations of contrast timing, no
convincing thrombus. Large arachnoid granulation within the distal
right transverse dural venous sinus.

Anatomic variants: As described

Review of the MIP images confirms the above findings
IMPRESSION: CT head:

No CT evidence of acute intracranial abnormality. A few punctate
acute/subacute infarcts were noted within the bilateral
parietooccipital lobes on the recent prior MRI of [DATE].

CTA neck:

The common carotid, internal carotid and vertebral arteries are
patent within the neck without stenosis. No significant
atherosclerotic disease.

CTA head:

No intracranial large vessel occlusion or proximal high-grade
arterial stenosis.

## 2020-02-29 MED ORDER — VITAMIN B-12 1000 MCG PO TABS
1000.0000 ug | ORAL_TABLET | Freq: Every day | ORAL | Status: DC
  Filled 2020-02-29: qty 1

## 2020-02-29 MED ORDER — ASPIRIN EC 81 MG PO TBEC
81.0000 mg | DELAYED_RELEASE_TABLET | Freq: Every day | ORAL | Status: DC
  Administered 2020-02-29 – 2020-03-01 (×2): 81 mg via ORAL
  Filled 2020-02-29 (×2): qty 1

## 2020-02-29 MED ORDER — STROKE: EARLY STAGES OF RECOVERY BOOK: Freq: Once | Status: DC

## 2020-02-29 MED ORDER — TOPIRAMATE 25 MG PO TABS
25.0000 mg | ORAL_TABLET | Freq: Every day | ORAL | Status: DC
  Administered 2020-02-29 – 2020-03-01 (×2): 25 mg via ORAL
  Filled 2020-02-29 (×2): qty 1

## 2020-02-29 MED ORDER — ACETAMINOPHEN 160 MG/5ML PO SOLN
650.0000 mg | ORAL | Status: DC | PRN
  Filled 2020-02-29: qty 20.3

## 2020-02-29 MED ORDER — ENOXAPARIN SODIUM 40 MG/0.4ML ~~LOC~~ SOLN: 40.0000 mg | SUBCUTANEOUS | Status: DC

## 2020-02-29 MED ORDER — BUPROPION HCL ER (SR) 150 MG PO TB12
150.0000 mg | ORAL_TABLET | Freq: Two times a day (BID) | ORAL | Status: DC
  Administered 2020-02-29 – 2020-03-01 (×2): 150 mg via ORAL
  Filled 2020-02-29 (×3): qty 1

## 2020-02-29 MED ORDER — IOHEXOL 350 MG/ML SOLN
75.0000 mL | Freq: Once | INTRAVENOUS | Status: AC | PRN
  Administered 2020-02-29: 75 mL via INTRAVENOUS
  Filled 2020-02-29: qty 75

## 2020-02-29 MED ORDER — LEVOTHYROXINE SODIUM 50 MCG PO TABS: 75.0000 ug | ORAL_TABLET | Freq: Every day | ORAL | Status: DC

## 2020-02-29 MED ORDER — ASPIRIN 325 MG PO TABS
325.0000 mg | ORAL_TABLET | Freq: Every day | ORAL | Status: DC
  Filled 2020-02-29: qty 1

## 2020-02-29 MED ORDER — ATORVASTATIN CALCIUM 20 MG PO TABS
40.0000 mg | ORAL_TABLET | Freq: Every day | ORAL | Status: DC
  Administered 2020-02-29 – 2020-03-01 (×2): 40 mg via ORAL
  Filled 2020-02-29 (×2): qty 2

## 2020-02-29 MED ORDER — VITAMIN B-12 1000 MCG SL SUBL: 1000.0000 ug | SUBLINGUAL_TABLET | Freq: Every day | SUBLINGUAL | Status: DC

## 2020-02-29 MED ORDER — ACETAMINOPHEN 650 MG RE SUPP: 650.0000 mg | RECTAL | Status: DC | PRN

## 2020-02-29 MED ORDER — CLOPIDOGREL BISULFATE 75 MG PO TABS
75.0000 mg | ORAL_TABLET | Freq: Every day | ORAL | Status: DC
  Administered 2020-02-29 – 2020-03-01 (×2): 75 mg via ORAL
  Filled 2020-02-29 (×2): qty 1

## 2020-02-29 MED ORDER — ACETAMINOPHEN 325 MG PO TABS: 650.0000 mg | ORAL_TABLET | ORAL | Status: DC | PRN

## 2020-02-29 MED ORDER — BUPROPION HCL ER (XL) 150 MG PO TB24: 150.0000 mg | ORAL_TABLET | Freq: Two times a day (BID) | ORAL | Status: DC

## 2020-02-29 MED ORDER — CITALOPRAM HYDROBROMIDE 20 MG PO TABS
40.0000 mg | ORAL_TABLET | Freq: Every day | ORAL | Status: DC
Start: 2020-02-23 — End: 2020-03-01
  Administered 2020-03-01: 40 mg via ORAL
  Filled 2020-02-29: qty 2

## 2020-02-29 MED ORDER — BUPROPION HCL ER (XL) 150 MG PO TB24: 150.0000 mg | ORAL_TABLET | Freq: Every day | ORAL | Status: DC

## 2020-02-29 NOTE — Evaluation (Signed)
Occupational Therapy Evaluation Patient Details Name: Natalie [name] MRN: [id number] DOB: [birth year] Today's Date: 02/29/2020    History of Present Illness presented to ER secondary to increased frequency of episodes of double vision, dizziness and headache; admitted for TIA/CVA work up.  Imaging significant for acute/subacute infarct to bilat parieto-occipital lobes.   Clinical Impression   Pt seen for OT evaluation this date in setting of acute hospitalization for stroke workup. Pt presents at time of OT evaluation reporting mostly resolved symptoms at this time. Upon assessment of UEs/LEs, pt does report some decreased sensation on R UE/LE versus L. Neuro MD aware of this. Pt able to perform mobility with MOD I and demos appropriate fine motor coordination, ROM and strength. Decreased sensation appears to be light and pt can still detect where she is being touched and react appropriately to stimuli. Pt demos ability to perform her self care ADLs at her reported baseline. OT educates re: stroke prevention and visual occlusion should she experience another episode of double vision. Pt and family member present report good understanding of all education. Pt does not appear to have further acute OT needs at this time nor is it suspected that she will require OT follow up. Will complete order at this time. Thank you.     Follow Up Recommendations  No OT follow up    Equipment Recommendations  Tub/shower seat    Recommendations for Other Services       Precautions / Restrictions Precautions Precautions: Fall Restrictions Weight Bearing Restrictions: No      Mobility Bed Mobility Overal bed mobility: Modified Independent                  Transfers Overall transfer level: Modified independent                    Balance Overall balance assessment: Mild deficits observed, not formally tested                                         ADL either  performed or assessed with clinical judgement   ADL Overall ADL's : Modified independent;At baseline                                       General ADL Comments: SETUP/MOD I with all ADLs/ADL mobility     Vision Patient Visual Report: No change from baseline Additional Comments: pt was experiencing double vision stacked vertically initially on admit, but reports it appears at baseilne now     Perception     Praxis      Pertinent Vitals/Pain Pain Assessment: No/denies pain     Hand Dominance Right   Extremity/Trunk Assessment Upper Extremity Assessment Upper Extremity Assessment: Overall WFL for tasks assessed;RUE deficits/detail (grossly 4/5 throughout, grossly equal, no unilateral deficits in ROM/MMT appreciated.) RUE Sensation: decreased light touch   Lower Extremity Assessment Lower Extremity Assessment: Defer to PT evaluation;Overall WFL for tasks assessed;RLE deficits/detail RLE Sensation: decreased light touch       Communication Communication Communication: No difficulties   Cognition Arousal/Alertness: Awake/alert Behavior During Therapy: WFL for tasks assessed/performed Overall Cognitive Status: Within Functional Limits for tasks assessed  General Comments       Exercises Other Exercises Other Exercises: OT educates re: role of OT, importance of diet and regular exercise to prevent strokes. Pt and family with good understanding.   Shoulder Instructions      Home Living Family/patient expects to be discharged to:: Private residence Living Arrangements: Children Available Help at Discharge: Family;Available PRN/intermittently Type of Home: House Home Access: Stairs to enter [employer] of Steps: 4 Entrance Stairs-Rails: Right;Can reach both;Left Home Layout: Two level;Able to live on main level with bedroom/bathroom               Home Equipment: None           Prior Functioning/Environment Level of Independence: Independent        Comments: Indep with ADLs, household and community mobilization; denies fall history.        OT Problem List: Decreased activity tolerance;Impaired vision/perception;Impaired sensation      OT Treatment/Interventions: Self-care/ADL training;Therapeutic activities    OT Goals(Current goals can be found in the care plan section) Acute Rehab OT Goals Patient Stated Goal: to return home OT Goal Formulation: All assessment and education complete, DC therapy  OT Frequency:     Barriers to D/C:            Co-evaluation              AM-PAC OT "6 Clicks" Daily Activity     Outcome Measure Help from another person eating meals?: None Help from another person taking care of personal grooming?: None Help from another person toileting, which includes using toliet, bedpan, or urinal?: None Help from another person bathing (including washing, rinsing, drying)?: A Little (for safety, d/t wet floor) Help from another person to put on and taking off regular upper body clothing?: None Help from another person to put on and taking off regular lower body clothing?: None 6 Click Score: 23   End of Session    Activity Tolerance: Patient tolerated treatment well Patient left: in bed;with call bell/phone within reach  OT Visit Diagnosis: Other abnormalities of gait and mobility (R26.89)                Time: 7322-0254 OT Time Calculation (min): 34 min Charges:  OT General Charges $OT Visit: 1 Visit OT Evaluation $OT Eval Low Complexity: 1 Low OT Treatments $Self Care/Home Management : 8-22 mins  Gerrianne Scale, [place], OTR/L ascom [phone] 02/29/20, 2:42 PM

## 2020-02-29 NOTE — H&P (Signed)
History and Physical    Natalie [name] [id number] DOB: [birth year] DOA: 02/28/2020  PCP: [name], MD   Patient coming from : home  I have personally briefly reviewed patient's old medical records in [place]  Chief Complaint: Double vision  HPI: Natalie [name] is a 70 y.o. female with medical history significant for Depression, hypothyroidism, migraines, who presents to the emergency room with a 1 month history of intermittent double vision lasting about a minute or 2.  At first it happened about 3 times a week but over the past weekend she had 3 episodes and they lasted longer than usual, additionally she had one episode where she lost vision in part of her visual field in one eye.  She said occasionally it is associated with dizziness and headache but she denies any slurred speech or weakness numbness or tingling in the face or extremities.  She was seen by her PCP in the recent past and was awaiting a neurology appointment on 2/23 but given the increased frequency of the episodes she decided to come into the emergency room for evaluation. ED course: On arrival, afebrile BP 129/90 pulse 77 respirations 18 with O2 sat 100% on room air.  CBC and BMP remarkable only for mild thrombocytopenia of 100 and 8K. EKG as interpreted by me shows normal sinus rhythm at 75 with nonspecific ST-T wave changes Imaging: Patient had extensive imaging to include CT head, MR angiogram neck with and without as well as MRI brain with and without with findings of normal MRA of neck and few scattered punctate acute to subacute ischemic infarcts involving the cortical/subcortical aspects of both parieto-occipital regions she has associated minimal petechial hemorrhage without malignant hemorrhagic transformation or significant regional mass-effect.  Patient given aspirin 325.  Hospitalist consulted for admission.  Home   Review of Systems: As per HPI otherwise all other systems on review of  systems negative.    Past Medical History:  Diagnosis Date  . Acute bronchospasm due to viral infection   . Anemia    history of  . CVA (cerebral vascular accident) ([place]) 02/28/2020  . Depressive disorder   . Epistaxis   . Fatigue   . Hearing loss   . Hypothyroidism   . Memory change   . Migraine   . Numbness and tingling   . Obesity (BMI 30.0-34.9)   . Osteoarthritis   . Osteoporosis   . Polyuria   . Premature menopause   . Seizures ([place])    history of mini seizures, possible migraine induced    Past Surgical History:  Procedure Laterality Date  . ABLATION  2006  . CATARACT EXTRACTION W/ INTRAOCULAR LENS IMPLANT Bilateral   . COLONOSCOPY    . COLONOSCOPY WITH PROPOFOL N/A 12/22/2018   Procedure: COLONOSCOPY WITH PROPOFOL;  Surgeon: Virgel Manifold, MD;  Location: [facility] ENDOSCOPY;  Service: Endoscopy;  Laterality: N/A;  . EYE SURGERY  Spring 2018   Cataracts both eyes  . TONSILLECTOMY       reports that she quit smoking about 46 years ago. Her smoking use included cigarettes. She has a 3.00 pack-year smoking history. She has never used smokeless tobacco. She reports current alcohol use. She reports that she does not use drugs.  Allergies  Allergen Reactions  . Sulfamethoxazole-Trimethoprim Hives    Family History  Problem Relation Age of Onset  . Early death Father        suicide  . Depression Father   . Diabetes Father   .  Cancer Father        prostate  . Hearing loss Father        due to war  . Alzheimer's disease Mother   . Heart disease Brother   . Heart disease Maternal Aunt   . Dementia Maternal Aunt   . Heart disease Maternal Uncle   . Dementia Maternal Grandmother   . Heart disease Brother       Prior to Admission medications   Medication Sig Start Date End Date Taking? Authorizing Provider  buPROPion (WELLBUTRIN SR) 150 MG 12 hr tablet Take 150 mg by mouth 2 (two) times daily.    Lew Dawes, MD  citalopram (CELEXA) 40 MG tablet Take  40 mg by mouth daily.    Lew Dawes, MD  clindamycin-benzoyl peroxide [facility]) gel Apply topically 2 (two) times daily. 05/10/19   [name], MD  Cyanocobalamin (VITAMIN B-12) 1000 MCG SUBL Place under the tongue.    [provider]  levothyroxine (SYNTHROID) 75 MCG tablet Take 1 tablet (75 mcg total) by mouth daily before breakfast. 11/09/19   [name], MD  Multiple Vitamins-Minerals (MULTIVITAMIN WOMEN 50+ PO) Take 1 tablet by mouth daily.    [provider]  Omega-3 Fatty Acids (FISH OIL) 1000 MG CAPS Take 1 capsule by mouth daily.    [provider]  SUMAtriptan (IMITREX) 100 MG tablet May repeat in 2 hours if headache persists or recurs. 11/09/19   [name], MD  topiramate (TOPAMAX) 25 MG tablet Take 25 mg by mouth daily.     Lew Dawes, MD  Vitamin D, Ergocalciferol, (DRISDOL) 1.25 MG (50000 UNIT) CAPS capsule TAKE 1 CAPSULE BY MOUTH EVERY 7 DAYS 09/20/19   [name], MD    Physical Exam: Vitals:   02/28/20 2300 02/28/20 2330 02/29/20 0000 02/29/20 0230  BP: (!) 168/98 (!) 168/79 (!) 157/81 135/68  Pulse: 66 71 69 69  Resp: 17 (!) 22 18 19   Temp:      TempSrc:      SpO2: 100% 100% 100% 97%     Vitals:   02/28/20 2300 02/28/20 2330 02/29/20 0000 02/29/20 0230  BP: (!) 168/98 (!) 168/79 (!) 157/81 135/68  Pulse: 66 71 69 69  Resp: 17 (!) 22 18 19   Temp:      TempSrc:      SpO2: 100% 100% 100% 97%      Constitutional: Alert and oriented x 3 . Not in any apparent distress HEENT:      Head: Normocephalic and atraumatic.         Eyes: PERLA, EOMI, Conjunctivae are normal. Sclera is non-icteric.       Mouth/Throat: Mucous membranes are moist.       Neck: Supple with no signs of meningismus. Cardiovascular: Regular rate and rhythm. No murmurs, gallops, or rubs. 2+ symmetrical distal pulses are present . No JVD. No LE edema Respiratory: Respiratory effort normal .Lungs sounds clear bilaterally. No wheezes, crackles,  or rhonchi.  Gastrointestinal: Soft, non tender, and non distended with positive bowel sounds.  Genitourinary: No CVA tenderness. Musculoskeletal: Nontender with normal range of motion in all extremities. No cyanosis, or erythema of extremities. Neurologic:  Face is symmetric. Moving all extremities. No gross focal neurologic deficits . Skin: Skin is warm, dry.  No rash or ulcers Psychiatric: Mood and affect are normal    Labs on Admission: I have personally reviewed following labs and imaging studies  CBC: Recent Labs  Lab 02/28/20 0950  WBC 6.9  HGB 14.7  HCT 44.5  MCV 93.5  PLT 222*   Basic Metabolic Panel: Recent Labs  Lab 02/28/20 0950  NA 141  K 3.7  CL 104  CO2 24  GLUCOSE 104*  BUN 16  CREATININE 0.93  CALCIUM 9.5   GFR: CrCl cannot be calculated (Unknown ideal weight.). Liver Function Tests: No results for input(s): AST, ALT, ALKPHOS, BILITOT, PROT, ALBUMIN in the last 168 hours. No results for input(s): LIPASE, AMYLASE in the last 168 hours. No results for input(s): AMMONIA in the last 168 hours. Coagulation Profile: No results for input(s): INR, PROTIME in the last 168 hours. Cardiac Enzymes: No results for input(s): CKTOTAL, CKMB, CKMBINDEX, TROPONINI in the last 168 hours. BNP (last 3 results) No results for input(s): PROBNP in the last 8760 hours. HbA1C: No results for input(s): HGBA1C in the last 72 hours. CBG: No results for input(s): GLUCAP in the last 168 hours. Lipid Profile: No results for input(s): CHOL, HDL, LDLCALC, TRIG, CHOLHDL, LDLDIRECT in the last 72 hours. Thyroid Function Tests: No results for input(s): TSH, T4TOTAL, FREET4, T3FREE, THYROIDAB in the last 72 hours. Anemia Panel: No results for input(s): VITAMINB12, FOLATE, FERRITIN, TIBC, IRON, RETICCTPCT in the last 72 hours. Urine analysis: No results found for: COLORURINE, APPEARANCEUR, LABSPEC, [place], GLUCOSEU, HGBUR, BILIRUBINUR, KETONESUR, PROTEINUR, UROBILINOGEN, NITRITE,  LEUKOCYTESUR  Radiological Exams on Admission: CT Head Wo Contrast  Result Date: 02/28/2020 CLINICAL DATA:  70 year old female with dizziness and vision changes for 24 hours. TIA. EXAM: CT HEAD WITHOUT CONTRAST TECHNIQUE: Contiguous axial images were obtained from the base of the skull through the vertex without intravenous contrast. COMPARISON:  Head CT 12/20/2017. FINDINGS: Brain: Brain volume is stable since 2019 and within normal limits for age. No midline shift, ventriculomegaly, mass effect, evidence of mass lesion, intracranial hemorrhage or evidence of cortically based acute infarction. Gray-white matter differentiation is within normal limits for age. Vascular: No suspicious intracranial vascular hyperdensity. Skull: Stable osteopenia.  No acute osseous abnormality identified. Sinuses/Orbits: Visualized paranasal sinuses and mastoids are stable and well pneumatized. Tympanic cavities appear clear. Other: Stable, negative orbit and scalp soft tissues. IMPRESSION: Stable since 2019 and negative for age noncontrast Head CT. Electronically Signed   By: Genevie Ann M.D.   On: 02/28/2020 11:53   MR Angiogram Neck W or Wo Contrast  Result Date: 02/28/2020 CLINICAL DATA:  Initial evaluation for intermittent visual disturbance, double vision. EXAM: MRA NECK WITHOUT AND WITH CONTRAST TECHNIQUE: Multiplanar and multiecho pulse sequences of the neck were obtained without and with intravenous contrast. Angiographic images of the neck were obtained using MRA technique without and with intravenous contrast. CONTRAST:  46mL GADAVIST GADOBUTROL 1 MMOL/ML IV SOLN COMPARISON:  Comparison made with corresponding brain MRI performed on the same day. FINDINGS: AORTIC ARCH: Examination mildly degraded by motion artifact. Visualized aortic arch of normal caliber with normal 3 vessel morphology. No hemodynamically significant stenosis seen about the origin of the great vessels. Visualized subclavian arteries widely patent.  RIGHT CAROTID SYSTEM: Right CCA patent from its origin to the bifurcation without stenosis. No significant atheromatous irregularity or stenosis about the right carotid bifurcation. Right ICA patent distally without stenosis, evidence for dissection, or occlusion. LEFT CAROTID SYSTEM: Left CCA patent from its origin to the bifurcation without stenosis. No significant atheromatous irregularity or stenosis about the left bifurcation. Left ICA patent distally without stenosis, evidence for dissection or occlusion. VERTEBRAL ARTERIES: Both vertebral arteries arise from the subclavian arteries. Neither vertebral artery origin well  visualized due to motion artifact. Right vertebral artery dominant. Visualized vertebral arteries patent within the neck without stenosis, evidence for dissection or occlusion. Diminutive left vertebral artery appears to largely terminate in PICA, although a tiny branch ascending towards the vertebrobasilar junction. IMPRESSION: Normal MRA of the neck. No hemodynamically significant stenosis or other acute vascular abnormality. Electronically Signed   By: Jeannine Boga M.D.   On: 02/28/2020 22:34   MR Brain W and Wo Contrast  Result Date: 02/28/2020 CLINICAL DATA:  Initial evaluation for acute diplopia. EXAM: MRI HEAD WITHOUT AND WITH CONTRAST TECHNIQUE: Multiplanar, multiecho pulse sequences of the brain and surrounding structures were obtained without and with intravenous contrast. CONTRAST:  33mL GADAVIST GADOBUTROL 1 MMOL/ML IV SOLN COMPARISON:  Prior head CT from earlier the same day. FINDINGS: Brain: Volume cerebral volume within normal limits for age. Minimal scattered patchy T2/FLAIR hyperintensity within the periventricular white matter and pons, most like related chronic microvascular ischemic disease, mild for age. There are a few scattered punctate foci of diffusion abnormality seen involving the cortical/subcortical aspects of both parieto-occipital regions (series 5,  images 29, 24, 20, 18), consistent with tiny acute to subacute ischemic infarcts. Associated minimal petechial hemorrhage (series 13, image 25). Subtle associated postcontrast enhancement consistent with subacute ischemia (series 32, images 113, 81). No malignant hemorrhagic transformation or significant regional mass effect. No evidence for subarachnoid hemorrhage within this region on prior head CT. No other diffusion abnormality to suggest acute or subacute ischemia. Gray-white matter differentiation otherwise maintained. No other imaging findings of PRES. No other areas of encephalomalacia to suggest chronic cortical infarction. No other evidence for acute or chronic intracranial hemorrhage. No mass lesion, midline shift or mass effect. Ventricles normal size without hydrocephalus. No extra-axial fluid collection. Pituitary gland suprasellar region normal. Midline structures intact. No other abnormal enhancement. Vascular: Major intracranial vascular flow voids are grossly maintained. Skull and upper cervical spine: Craniocervical junction within normal limits. Bone marrow signal intensity normal. No scalp soft tissue abnormality. Sinuses/Orbits: Patient status post bilateral ocular lens replacement. Globes and orbital soft tissues demonstrate no acute finding. Paranasal sinuses are clear. Trace left mastoid effusion noted, of doubtful significance. Inner ear structures grossly normal. Other: None. IMPRESSION: 1. Few scattered punctate acute to subacute ischemic infarcts involving the cortical/subcortical aspects of both parieto-occipital regions. Associated minimal petechial hemorrhage without malignant hemorrhagic transformation or significant regional mass effect. 2. No other acute intracranial abnormality. 3. Underlying mild chronic microvascular ischemic disease. Electronically Signed   By: Jeannine Boga M.D.   On: 02/28/2020 22:27     Assessment/Plan 70 year old female with history of  depression, hypothyroidism, migraines, who presenting with a 1 month history of intermittent double vision and visual field loss lasting up to 2 minutes, becoming more frequent in the recent few days.     Acute/subacute CVA (cerebrovascular accident) ([place]) -1 month of intermittent double vision and visual field loss -MRI, few scattered punctate acute to subacute ischemic infarcts involving the cortical/subcortical aspects of both parieto-occipital regions she has associated minimal petechial hemorrhage without malignant hemorrhagic transformation or significant regional mass-effect. -Aspirin and Lipitor -Continuous cardiac monitoring, echocardiogram -Neurology consult -SCDs for DVT prophylaxis given petechial hemorrhage    Other specified hypothyroidism -Continue levothyroxine    Moderate major depression (HCC) -Continue Celexa and bupropion    Migraine without aura and without status migrainosus, not intractable -Continue sumatriptan and topiramate    Thrombocytopenia (HCC) -Platelets 108,000.  Etiology uncertain.  But patient states a past history of easy bruising -Consider  hematology consult    DVT prophylaxis: SCDs Code Status: full code  Family Communication:  none  Disposition Plan: Back to previous home environment Consults called: Neurology Status:observation    Athena Masse MD [facility]     02/29/2020, 4:28 AM

## 2020-02-29 NOTE — Evaluation (Signed)
Physical Therapy Evaluation Patient Details Name: Natalie [name] MRN: [id number] DOB: 1950-03-12 Today's Date: 02/29/2020   History of Present Illness  presented to ER secondary to increased frequency of episodes of double vision, dizziness and headache; admitted for TIA/CVA work up.  Imaging significant for acute/subacute infarct to bilat parieto-occipital lobes.  Clinical Impression  Upon evaluation, patient alert and oriented; follows commands and agreeable to participation with session.  Reports all presenting symptoms (visual deficits, dizziness) have fully-resolved; [name] noted change in UE/LE strength or abilities.  Does endorse vertical/stacked diplopia when episodes occur; typically lasting 2-3 minutes at a time. At this time, visual abilities [facility]; denies dizziness, diplopia or change in visual fields.  Bilat UE/LE strength and ROM grossly symmetrical and WFL; [name] focal weakness, sensory or coordination deficits appreciated.  Able to complete bed mobility with mod indep; sit/stand, basic transfers and gait (50') without assist device, sup/mod indep.  Demonstrates reciprocal stepping pattern with fair step height/length; fair trunk rotation and arm swing.  Mild lateral trunk flexion/sway during SLS, but [name] overt buckling or LOB.  Negotiates obstacles and turns without difficulty; feels at baseline for gait efforts.  Denies episodes of dizziness, visual deficits during gait trial. Appears to be at  Baseline level of functional ability; [name] acute PT needs identified at this time.  Patient and daughter voiced agreement/understanding.  Will complete initial PT order. Please re-consult should needs change.    Follow Up Recommendations [name] PT follow up    Equipment Recommendations       Recommendations for Other Services       Precautions / Restrictions Precautions Precautions: Fall Restrictions Weight Bearing Restrictions: [name]      Mobility  Bed Mobility Overal bed mobility: Modified  Independent                  Transfers Overall transfer level: Modified independent Equipment used: None             General transfer comment: good LE strength/control with movement transition  Ambulation/Gait Ambulation/Gait assistance: Supervision Gait Distance (Feet): 50 Feet Assistive device: None       General Gait Details: reciprocal stepping pattern with fair step height/length; fair trunk rotation and arm swing.  Mild lateral trunk flexion/sway during SLS, but [name] overt buckling or LOB.  Negotiates obstacles and turns without difficulty; feels at baseline for gait efforts.  Denies episodes of dizziness, visual deficits during gait trial.  Stairs            Wheelchair Mobility    Modified Rankin (Stroke Patients Only)       Balance Overall balance assessment: Needs assistance Sitting-balance support: [name] upper extremity supported;Feet supported Sitting balance-Leahy Scale: Normal     Standing balance support: [name] upper extremity supported Standing balance-Leahy Scale: Good   Single Leg Stance - Right Leg: 3 Single Leg Stance - Left Leg: 2     [place] - Eyes Opened: 5 (difficulty with foot placement/position due to valgus angle at knees)     High Level Balance Comments: standing functional reach approx 6" from immediate BOS; able to turn/look behind equally bilat without LOB; maintains modified tandem-stance >10 seconds bilat             Pertinent Vitals/Pain Pain Assessment: [name]/denies pain    Home Living Family/patient expects to be discharged to:: Private residence   Available Help at Discharge: Family;Available PRN/intermittently Type of Home: House Home Access: Stairs to enter Entrance Stairs-Rails: Right;Can reach both;Left Entrance Stairs-Number of Steps:  4 Home Layout: Two level;Able to live on main level with bedroom/bathroom Home Equipment: None      Prior Function Level of Independence: Independent         Comments:  Indep with ADLs, household and community mobilization; denies fall history.     Hand Dominance   Dominant Hand: Right    Extremity/Trunk Assessment   Upper Extremity Assessment Upper Extremity Assessment: Overall WFL for tasks assessed (grossly 4+/5 throughout; [name] focal weakness appreciated)    Lower Extremity Assessment Lower Extremity Assessment: Overall WFL for tasks assessed (grossly 4+/5 throughout; [name] focal weakness appreciated)       Communication   Communication: [name] difficulties  Cognition Arousal/Alertness: Awake/alert Behavior During Therapy: WFL for tasks assessed/performed Overall Cognitive Status: Within Functional Limits for tasks assessed                                        General Comments      Exercises     Assessment/Plan    PT Assessment Patent does not need any further PT services  PT Problem List Decreased balance;Decreased mobility       PT Treatment Interventions Gait training;Functional mobility training;Therapeutic activities;Balance training;Patient/family education    PT Goals (Current goals can be found in the Care Plan section)  Acute Rehab PT Goals Patient Stated Goal: to return home PT Goal Formulation: With patient/family Time For Goal Achievement: 02/29/20 Potential to Achieve Goals: Good    Frequency     Barriers to discharge        Co-evaluation               AM-PAC PT "6 Clicks" Mobility  Outcome Measure Help needed turning from your back to your side while in a flat bed without using bedrails?: None Help needed moving from lying on your back to sitting on the side of a flat bed without using bedrails?: None Help needed moving to and from a bed to a chair (including a wheelchair)?: None Help needed standing up from a chair using your arms (e.g., wheelchair or bedside chair)?: None Help needed to walk in hospital room?: None Help needed climbing 3-5 steps with a railing? : A Little 6 Click  Score: 23    End of Session   Activity Tolerance: Patient tolerated treatment well Patient left: in bed;with call bell/phone within reach;with family/visitor present Nurse Communication: Mobility status PT Visit Diagnosis: Difficulty in walking, not elsewhere classified (R26.2);Muscle weakness (generalized) (M62.81);Other symptoms and signs involving the nervous system (R29.898)    Time: 3244-0102 PT Time Calculation (min) (ACUTE ONLY): 22 min   Charges:   PT Evaluation $PT Eval Moderate Complexity: 1 Mod          Huntington Leverich H. Owens Shark, PT, DPT, NCS 02/29/20, 10:30 AM [phone]

## 2020-02-29 NOTE — ED Notes (Signed)
Echo at bedside

## 2020-02-29 NOTE — ED Notes (Signed)
Pt taken for CT 

## 2020-02-29 NOTE — Consult Note (Signed)
Neurology Consultation  Reason for Consult: Dizziness, double vision, stroke Referring Physician: Dr. Zhang/Dr. Brock Bad  CC: Double vision, dizziness, stroke on MRI  History is obtained from: Patient, chart  HPI: Natalie [name] is a 70 y.o. female past medical history of hypothyroidism, migraines, history of possible mini seizures, no antiepileptics, presented to the emergency room for evaluation of dizziness and double vision sometime yesterday. She describes her symptoms as being off and on since August or September 2021 where she would get double vision-the initial episode that lasted the longest was described as double vision where the objects were one on top of the other and the double vision would go away when she closed one eye.  She had seen her primary care provider because similar episodes happened on multiple occasions and was referred to see outpatient neurology and has an appointment in February. She came into the ER yesterday because of 2 similar incidents where she was dizzy-as an off-balance and seemed to have double vision/blurred vision-once on Saturday, January 22 and 1 episode briefly yesterday around 11 AM. By the time she came to the ER, her symptoms had resolved. She was seen and evaluated in the emergency room and an MRI of the brain was done, which revealed scattered embolic-looking infarcts as described below. Neurological consultation was obtained for further recommendations regarding this.  Patient denies any sick exposures.  Denies cough cold.  Denies any history of prior strokes.  Has some family members with a history of stroke and one family member with history of heart disease in the 29s but no other significant history.  History of Alzheimer's/dementia and multiple family members.  LKW: Unclear tpa given?: no, symptoms resolved and outside the window Premorbid modified Rankin scale (mRS):0  ROS: Obtained and negative except as noted in  HPI  Past Medical History:  Diagnosis Date  . Acute bronchospasm due to viral infection   . Anemia    history of  . CVA (cerebral vascular accident) ([place]) 02/28/2020  . Depressive disorder   . Epistaxis   . Fatigue   . Hearing loss   . Hypothyroidism   . Memory change   . Migraine   . Numbness and tingling   . Obesity (BMI 30.0-34.9)   . Osteoarthritis   . Osteoporosis   . Polyuria   . Premature menopause   . Seizures ([facility])    history of mini seizures, possible migraine induced    Family History  Problem Relation Age of Onset  . Early death Father        suicide  . Depression Father   . Diabetes Father   . Cancer Father        prostate  . Hearing loss Father        due to war  . Alzheimer's disease Mother   . Heart disease Brother   . Heart disease Maternal Aunt   . Dementia Maternal Aunt   . Heart disease Maternal Uncle   . Dementia Maternal Grandmother   . Heart disease Brother      Social History:   reports that she quit smoking about 46 years ago. Her smoking use included cigarettes. She has a 3.00 pack-year smoking history. She has never used smokeless tobacco. She reports current alcohol use. She reports that she does not use drugs.  Medications  Current Facility-Administered Medications:  .   stroke: mapping our early stages of recovery book, , Does not apply, Once, Athena Masse, MD .  acetaminophen (TYLENOL)  tablet 650 mg, 650 mg, Oral, Q4H PRN **OR** acetaminophen (TYLENOL) 160 MG/5ML solution 650 mg, 650 mg, Per Tube, Q4H PRN **OR** acetaminophen (TYLENOL) suppository 650 mg, 650 mg, Rectal, Q4H PRN, Athena Masse, MD .  aspirin tablet 325 mg, 325 mg, Oral, Daily, Judd Gaudier V, MD .  atorvastatin (LIPITOR) tablet 40 mg, 40 mg, Oral, Daily, Athena Masse, MD  Current Outpatient Medications:  .  aspirin EC 81 MG tablet, Take 81 mg by mouth See admin instructions. Take 1 tablet 4 times a week., Disp: , Rfl:  .  buPROPion (WELLBUTRIN SR) 150 MG  12 hr tablet, Take 150 mg by mouth 2 (two) times daily., Disp: , Rfl:  .  citalopram (CELEXA) 40 MG tablet, Take 40 mg by mouth daily., Disp: , Rfl:  .  clindamycin-benzoyl peroxide (BENZACLIN) gel, Apply topically 2 (two) times daily., Disp: 50 g, Rfl: 0 .  Cyanocobalamin (VITAMIN B-12) 1000 MCG SUBL, Place 1,000 mcg under the tongue daily., Disp: , Rfl:  .  levothyroxine (SYNTHROID) 75 MCG tablet, Take 1 tablet (75 mcg total) by mouth daily before breakfast., Disp: 90 tablet, Rfl: 1 .  Multiple Vitamins-Minerals (MULTIVITAMIN WOMEN 50+ PO), Take 1 tablet by mouth daily., Disp: , Rfl:  .  Omega-3 Fatty Acids (FISH OIL) 1000 MG CAPS, Take 1 capsule by mouth See admin instructions. Take 1 capsule 4 times a week., Disp: , Rfl:  .  SUMAtriptan (IMITREX) 100 MG tablet, May repeat in 2 hours if headache persists or recurs., Disp: 10 tablet, Rfl: 0 .  topiramate (TOPAMAX) 25 MG tablet, Take 25 mg by mouth daily. , Disp: , Rfl:  .  Vitamin D, Ergocalciferol, (DRISDOL) 1.25 MG (50000 UNIT) CAPS capsule, TAKE 1 CAPSULE BY MOUTH EVERY 7 DAYS, Disp: 12 capsule, Rfl: 1   Exam: Current vital signs: BP 136/85   Pulse 64   Temp 98.5 F (36.9 C) (Oral)   Resp 16   SpO2 100%  Vital signs in last 24 hours: Temp:  [98.5 F (36.9 C)] 98.5 F (36.9 C) (01/25 1315) Pulse Rate:  [62-79] 64 (01/26 1000) Resp:  [15-22] 16 (01/26 1000) BP: (112-173)/(52-151) 136/85 (01/26 1000) SpO2:  [97 %-100 %] 100 % (01/26 1000)  GENERAL: Awake, alert in NAD HEENT: - Normocephalic and atraumatic, dry mm, no LN++, no Thyromegally LUNGS - Clear to auscultation bilaterally with no wheezes CV - S1S2 RRR, no m/r/g, equal pulses bilaterally. ABDOMEN - Soft, nontender, nondistended with normoactive BS Ext: warm, well perfused, intact peripheral pulses, no edema  NEURO:  Mental Status: AA&Ox3  Language: speech is nondysarthric.  Naming, repetition, fluency, and comprehension intact. Cranial Nerves: PERRL. EOMI, visual  fields full, no facial asymmetry, facial sensation intact, hearing intact, tongue/uvula/soft palate midline, normal sternocleidomastoid and trapezius muscle strength. No evidence of tongue atrophy or fibrillations Motor: 5/5 in all fours.  She has a resting and action tremor in both extremities which she says is intermittent. Tone: is normal and bulk is normal, no cogwheeling Sensation-mildly diminished to all modalities on the right. Coordination: FTN intact bilaterally, no ataxia in BLE.  Tremor as above Gait- deferred  NIHSS-1 for sensory   Labs I have reviewed labs in epic and the results pertinent to this consultation are:   CBC    Component Value Date/Time   WBC 6.9 02/28/2020 0950   RBC 4.76 02/28/2020 0950   HGB 14.7 02/28/2020 0950   HGB 14.6 06/28/2015 1600   HCT 44.5 02/28/2020 0950   HCT  44.0 06/28/2015 1600   PLT 108 (L) 02/28/2020 0950   PLT 245 06/28/2015 1600   MCV 93.5 02/28/2020 0950   MCV 92 06/28/2015 1600   MCH 30.9 02/28/2020 0950   MCHC 33.0 02/28/2020 0950   RDW 12.1 02/28/2020 0950   RDW 14.2 06/28/2015 1600   LYMPHSABS 1,195 01/25/2020 0820   LYMPHSABS 1.7 06/28/2015 1600   EOSABS 220 01/25/2020 0820   EOSABS 0.2 06/28/2015 1600   BASOSABS 29 01/25/2020 0820   BASOSABS 0.0 06/28/2015 1600    CMP     Component Value Date/Time   NA 141 02/28/2020 0950   NA 140 06/28/2015 1600   K 3.7 02/28/2020 0950   CL 104 02/28/2020 0950   CO2 24 02/28/2020 0950   GLUCOSE 104 (H) 02/28/2020 0950   BUN 16 02/28/2020 0950   BUN 12 06/28/2015 1600   CREATININE 0.93 02/28/2020 0950   CREATININE 0.94 01/25/2020 0820   CALCIUM 9.5 02/28/2020 0950   PROT 7.1 01/25/2020 0820   PROT 6.9 06/28/2015 1600   ALBUMIN 4.0 04/30/2016 0907   ALBUMIN 4.3 06/28/2015 1600   AST 17 01/25/2020 0820   ALT 11 01/25/2020 0820   ALKPHOS 76 04/30/2016 0907   BILITOT 0.6 01/25/2020 0820   BILITOT 0.6 06/28/2015 1600   GFRNONAA >60 02/28/2020 0950   GFRNONAA 62 01/25/2020  0820   GFRAA 72 01/25/2020 0820    Lipid Panel     Component Value Date/Time   CHOL 165 02/29/2020 0547   CHOL 185 06/28/2015 1600   TRIG 97 02/29/2020 0547   HDL 38 (L) 02/29/2020 0547   HDL 33 (L) 06/28/2015 1600   CHOLHDL 4.3 02/29/2020 0547   VLDL 19 02/29/2020 0547   LDLCALC 108 (H) 02/29/2020 0547   LDLCALC 118 (H) 05/10/2019 1458   LDL 108 Hemoglobin A1c 5.0 2D echo pending  Imaging I have reviewed the images obtained:  CT-scan of the brain-no acute changes  MRI examination of the brain-with and without contrast-few scattered punctate acute to subacute ischemic infarcts in the cortical/subcortical aspects of both parieto-occipital regions associated minimal petechial hemorrhage without malignant hemorrhagic transformation or significant regional mass-effect.  Underlying mild chronic microvascular ischemic disease.  No other acute intracranial abnormality.  MRI of the neck was done-no acute findings. MRA head was not done.  Assessment:  70 year old woman with above past medical history presenting for evaluation of double vision and dizziness and noted to have bilateral subacute scattered punctate cortical/subcortical right occipital infarcts with minimal petechial hemorrhage without mangling named hemorrhagic transformation. Appearance concerning for cardioembolic versus atheroembolic phenomenology.  Very mild petechial hemorrhage in the acute/subacute strokes -low risk for frank hemorrhagic transformation.  Impression: Acute ischemic stroke  Recommendations: Frequent neurochecks Telemetry Permissive hypertension-allow for permissive hypertension and treat only if systolic blood pressures are greater than 220 on a as needed basis for the next 24 to 48 hours. Goal discharge blood pressure 140/90 or less Preventive treatment: Aspirin 81 Plavix 75 for now-continue for 3 weeks and after 3 weeks aspirin only. If the telemetry shows atrial fibrillation, will then need to  switch over from antiplatelet to anticoagulant for stroke prevention. PT OT next speech therapy Atorvastatin 80 for LDL goal of less than 70. A1c at goal 2D echocardiogram-might need TEE and long-term cardiac monitoring MRI of the head was not done, MRA neck was done only-I would recommend looking at the head and neck vasculature can completion with a CT angiogram of the head and neck.  Outpatient neurology  follow-up in 6 to 8 weeks after discharge-has an appointment with Dr. Manuella Ghazi at [facility] in [place] would recommend she keep that appointment.   Discussed my plan in detail with the patient and her daughter at bedside. Relayed my plan to the primary hospitalist via secure chat.  I am available for questions as needed.  I will follow the patient with you as test results become available.  -- Amie Portland, MD Neurologist [facility] Pager: [phone]

## 2020-02-29 NOTE — ED Notes (Signed)
PT at bedside.

## 2020-02-29 NOTE — Progress Notes (Signed)
*  PRELIMINARY RESULTS* Echocardiogram 2D Echocardiogram has been performed.  Sherrie Sport 02/29/2020, 1:27 PM

## 2020-02-29 NOTE — Progress Notes (Signed)
PROGRESS NOTE    Natalie [name]  [id number] DOB: [birth year] DOA: 02/28/2020 PCP: [name], MD   Chief complaint.  Double vision Brief Narrative:  Natalie [name] is a 70 y.o. female with medical history significant for Depression, hypothyroidism, migraines, who presents to the emergency room with a 1 month history of intermittent double vision lasting about a minute or 2.  At first it happened about 3 times a week but over the past weekend she had 3 episodes and they lasted longer than usual, additionally she had one episode where she lost vision in part of her visual field in one eye. Patient has been seen by neurology.  MRI of the brain showed a bilateral posterior ischemic changes.  CT angiogram neck and head negative.  Patient is treated with aspirin Plavix and statin.   Assessment & Plan:   Principal Problem:   Acute CVA (cerebrovascular accident) [facility]) Active Problems:   Other specified hypothyroidism   Moderate major depression ([place])   Migraine without aura and without status migrainosus, not intractable   Thrombocytopenia ([place])  #1.  Acute ischemic stroke at the bilateral posterior circulation. Appreciate neurology consult.  Discussed with neurology, wanted to keep patient for another night to make sure patient is stable. Continue aspirin, Plavix and Lipitor.   #2.  Hypothyroidism. Continue home medicines.  3.  Migraine headaches. Continue torsemide.     DVT prophylaxis: SCDs Code Status: Full Family Communication:  Disposition Plan:  .   Status is: Observation  The patient will require care spanning > 2 midnights and should be moved to inpatient because: Inpatient level of care appropriate due to severity of illness  Dispo: The patient is from: Home              Anticipated d/c is to: Home              Anticipated d/c date is: 1 day              Patient currently is not medically stable to d/c.   Difficult to place patient No         No intake/output data recorded. No intake/output data recorded.     Consultants:   Neurology  Procedures: None  Antimicrobials: None  Subjective: Patient still has some dizziness, double vision improving.  No headache. No focal weakness. No short of breath or cough.  Objective: Vitals:   02/29/20 1000 02/29/20 1200 02/29/20 1300 02/29/20 1515  BP: 136/85   (!) 128/94  Pulse: 64 71 68 78  Resp: 16 (!) 21 (!) 22 17  Temp:      TempSrc:      SpO2: 100% 98% 99% 97%   No intake or output data in the 24 hours ending 02/29/20 1607 There were no vitals filed for this visit.  Examination:  General exam: Appears calm and comfortable  Respiratory system: Clear to auscultation. Respiratory effort normal. Cardiovascular system: S1 & S2 heard, RRR. No JVD, murmurs, rubs, gallops or clicks. No pedal edema. Gastrointestinal system: Abdomen is nondistended, soft and nontender. No organomegaly or masses felt. Normal bowel sounds heard. Central nervous system: Alert and oriented. No focal neurological deficits. Extremities: Symmetric 5 x 5 power. Skin: No rashes, lesions or ulcers Psychiatry: Judgement and insight appear normal. Mood & affect appropriate.     Data Reviewed: I have personally reviewed following labs and imaging studies  CBC: Recent Labs  Lab 02/28/20 0950  WBC 6.9  HGB 14.7  HCT 44.5  MCV 93.5  PLT 454*   Basic Metabolic Panel: Recent Labs  Lab 02/28/20 0950  NA 141  K 3.7  CL 104  CO2 24  GLUCOSE 104*  BUN 16  CREATININE 0.93  CALCIUM 9.5   GFR: CrCl cannot be calculated (Unknown ideal weight.). Liver Function Tests: No results for input(s): AST, ALT, ALKPHOS, BILITOT, PROT, ALBUMIN in the last 168 hours. No results for input(s): LIPASE, AMYLASE in the last 168 hours. No results for input(s): AMMONIA in the last 168 hours. Coagulation Profile: No results for input(s): INR, PROTIME in the last 168 hours. Cardiac Enzymes: No results for  input(s): CKTOTAL, CKMB, CKMBINDEX, TROPONINI in the last 168 hours. BNP (last 3 results) No results for input(s): PROBNP in the last 8760 hours. HbA1C: Recent Labs    02/29/20 0547  HGBA1C 5.0   CBG: No results for input(s): GLUCAP in the last 168 hours. Lipid Profile: Recent Labs    02/29/20 0547  CHOL 165  HDL 38*  LDLCALC 108*  TRIG 97  CHOLHDL 4.3   Thyroid Function Tests: No results for input(s): TSH, T4TOTAL, FREET4, T3FREE, THYROIDAB in the last 72 hours. Anemia Panel: No results for input(s): VITAMINB12, FOLATE, FERRITIN, TIBC, IRON, RETICCTPCT in the last 72 hours. Sepsis Labs: No results for input(s): PROCALCITON, LATICACIDVEN in the last 168 hours.  Recent Results (from the past 240 hour(s))  SARS CORONAVIRUS 2 (TAT 6-24 HRS) Nasopharyngeal Nasopharyngeal Swab     Status: None   Collection Time: 02/28/20 11:19 PM   Specimen: Nasopharyngeal Swab  Result Value Ref Range Status   SARS Coronavirus 2 NEGATIVE NEGATIVE Final    Comment: (NOTE) SARS-CoV-2 target nucleic acids are NOT DETECTED.  The SARS-CoV-2 RNA is generally detectable in upper and lower respiratory specimens during the acute phase of infection. Negative results do not preclude SARS-CoV-2 infection, do not rule out co-infections with other pathogens, and should not be used as the sole basis for treatment or other patient management decisions. Negative results must be combined with clinical observations, patient history, and epidemiological information. The expected result is Negative.  Fact Sheet for Patients: SugarRoll.be  Fact Sheet for Healthcare Providers: https://www.woods-mathews.com/  This test is not yet approved or cleared by the [place] FDA and  has been authorized for detection and/or diagnosis of SARS-CoV-2 by FDA under an Emergency Use Authorization (EUA). This EUA will remain  in effect (meaning this test can be used) for the  duration of the COVID-19 declaration under Se ction 564(b)(1) of the Act, 21 U.S.C. section 360bbb-3(b)(1), unless the authorization is terminated or revoked sooner.  Performed at [facility], [place] [address] [place], [place] [address]          Radiology Studies: CT ANGIO HEAD W OR WO CONTRAST  Result Date: 02/29/2020 CLINICAL DATA:  Neuro deficit, acute, stroke suspected. Additional provided: Patient with intermittent double vision for the past several months. EXAM: CT ANGIOGRAPHY HEAD AND NECK TECHNIQUE: Multidetector CT imaging of the head and neck was performed using the standard protocol during bolus administration of intravenous contrast. Multiplanar CT image reconstructions and MIPs were obtained to evaluate the vascular anatomy. Carotid stenosis measurements (when applicable) are obtained utilizing NASCET criteria, using the distal internal carotid diameter as the denominator. CONTRAST:  61mL OMNIPAQUE IOHEXOL 350 MG/ML SOLN COMPARISON:  MRA neck 02/28/2020. MRI brain 02/28/2020. Head CT 02/28/2020. FINDINGS: CT HEAD FINDINGS Brain: Cerebral volume is normal. A few punctate acute/subacute infarcts or present within the bilateral parietooccipital lobes  on the MRI of 02/28/2020. No evidence of interval infarct. There is no acute intracranial hemorrhage. No extra-axial fluid collection. No evidence of intracranial mass. No midline shift. Vascular: Reported below. Skull: Normal. Negative for fracture or focal lesion. Sinuses: No significant paranasal sinus disease. Orbits: No mass or acute finding. Review of the MIP images confirms the above findings CTA NECK FINDINGS Aortic arch: Standard aortic branching. The visualized aortic arch is unremarkable. No hemodynamically significant innominate or proximal subclavian artery stenosis. Right carotid system: CCA and ICA patent within the neck without stenosis. No significant atherosclerotic disease. Tortuosity of the cervical ICA. Left carotid  system: CCA and ICA patent within neck without stenosis. No significant atherosclerotic disease. Tortuosity of the cervical ICA. Vertebral arteries: Patent within the neck without stenosis. The right vertebral artery is dominant. Skeleton: No acute bony abnormality or aggressive osseous lesion. Cervical spondylosis with multilevel disc space narrowing, disc bulges, posterior disc osteophytes and uncovertebral hypertrophy. Other neck: No neck mass or cervical lymphadenopathy. Thyroid unremarkable. Upper chest: No consolidation within the imaged lung apices. Review of the MIP images confirms the above findings CTA HEAD FINDINGS Anterior circulation: The intracranial internal carotid arteries are patent. The M1 middle cerebral arteries are patent. No M2 proximal branch occlusion or high-grade proximal stenosis is identified. The anterior cerebral arteries are patent. No intracranial aneurysm is identified. Posterior circulation: The dominant intracranial right vertebral artery is patent without stenosis. The non dominant intracranial left vertebral artery is developmentally diminutive and terminates predominantly as the left PICA, with only a small contribution to the basilar artery. The basilar artery is patent. The posterior cerebral arteries are patent. A left posterior communicating artery is present. The right posterior communicating artery is hypoplastic or absent. Venous sinuses: Within the limitations of contrast timing, no convincing thrombus. Large arachnoid granulation within the distal right transverse dural venous sinus. Anatomic variants: As described Review of the MIP images confirms the above findings IMPRESSION: CT head: No CT evidence of acute intracranial abnormality. A few punctate acute/subacute infarcts were noted within the bilateral parietooccipital lobes on the recent prior MRI of 02/28/2020. CTA neck: The common carotid, internal carotid and vertebral arteries are patent within the neck  without stenosis. No significant atherosclerotic disease. CTA head: No intracranial large vessel occlusion or proximal high-grade arterial stenosis. Electronically Signed   By: Kellie Simmering DO   On: 02/29/2020 12:59   CT Head Wo Contrast  Result Date: 02/28/2020 CLINICAL DATA:  70 year old female with dizziness and vision changes for 24 hours. TIA. EXAM: CT HEAD WITHOUT CONTRAST TECHNIQUE: Contiguous axial images were obtained from the base of the skull through the vertex without intravenous contrast. COMPARISON:  Head CT 12/20/2017. FINDINGS: Brain: Brain volume is stable since 2019 and within normal limits for age. No midline shift, ventriculomegaly, mass effect, evidence of mass lesion, intracranial hemorrhage or evidence of cortically based acute infarction. Gray-white matter differentiation is within normal limits for age. Vascular: No suspicious intracranial vascular hyperdensity. Skull: Stable osteopenia.  No acute osseous abnormality identified. Sinuses/Orbits: Visualized paranasal sinuses and mastoids are stable and well pneumatized. Tympanic cavities appear clear. Other: Stable, negative orbit and scalp soft tissues. IMPRESSION: Stable since 2019 and negative for age noncontrast Head CT. Electronically Signed   By: Genevie Ann M.D.   On: 02/28/2020 11:53   CT ANGIO NECK W OR WO CONTRAST  Result Date: 02/29/2020 CLINICAL DATA:  Neuro deficit, acute, stroke suspected. Additional provided: Patient with intermittent double vision for the past several months. EXAM:  CT ANGIOGRAPHY HEAD AND NECK TECHNIQUE: Multidetector CT imaging of the head and neck was performed using the standard protocol during bolus administration of intravenous contrast. Multiplanar CT image reconstructions and MIPs were obtained to evaluate the vascular anatomy. Carotid stenosis measurements (when applicable) are obtained utilizing NASCET criteria, using the distal internal carotid diameter as the denominator. CONTRAST:  46mL  OMNIPAQUE IOHEXOL 350 MG/ML SOLN COMPARISON:  MRA neck 02/28/2020. MRI brain 02/28/2020. Head CT 02/28/2020. FINDINGS: CT HEAD FINDINGS Brain: Cerebral volume is normal. A few punctate acute/subacute infarcts or present within the bilateral parietooccipital lobes on the MRI of 02/28/2020. No evidence of interval infarct. There is no acute intracranial hemorrhage. No extra-axial fluid collection. No evidence of intracranial mass. No midline shift. Vascular: Reported below. Skull: Normal. Negative for fracture or focal lesion. Sinuses: No significant paranasal sinus disease. Orbits: No mass or acute finding. Review of the MIP images confirms the above findings CTA NECK FINDINGS Aortic arch: Standard aortic branching. The visualized aortic arch is unremarkable. No hemodynamically significant innominate or proximal subclavian artery stenosis. Right carotid system: CCA and ICA patent within the neck without stenosis. No significant atherosclerotic disease. Tortuosity of the cervical ICA. Left carotid system: CCA and ICA patent within neck without stenosis. No significant atherosclerotic disease. Tortuosity of the cervical ICA. Vertebral arteries: Patent within the neck without stenosis. The right vertebral artery is dominant. Skeleton: No acute bony abnormality or aggressive osseous lesion. Cervical spondylosis with multilevel disc space narrowing, disc bulges, posterior disc osteophytes and uncovertebral hypertrophy. Other neck: No neck mass or cervical lymphadenopathy. Thyroid unremarkable. Upper chest: No consolidation within the imaged lung apices. Review of the MIP images confirms the above findings CTA HEAD FINDINGS Anterior circulation: The intracranial internal carotid arteries are patent. The M1 middle cerebral arteries are patent. No M2 proximal branch occlusion or high-grade proximal stenosis is identified. The anterior cerebral arteries are patent. No intracranial aneurysm is identified. Posterior  circulation: The dominant intracranial right vertebral artery is patent without stenosis. The non dominant intracranial left vertebral artery is developmentally diminutive and terminates predominantly as the left PICA, with only a small contribution to the basilar artery. The basilar artery is patent. The posterior cerebral arteries are patent. A left posterior communicating artery is present. The right posterior communicating artery is hypoplastic or absent. Venous sinuses: Within the limitations of contrast timing, no convincing thrombus. Large arachnoid granulation within the distal right transverse dural venous sinus. Anatomic variants: As described Review of the MIP images confirms the above findings IMPRESSION: CT head: No CT evidence of acute intracranial abnormality. A few punctate acute/subacute infarcts were noted within the bilateral parietooccipital lobes on the recent prior MRI of 02/28/2020. CTA neck: The common carotid, internal carotid and vertebral arteries are patent within the neck without stenosis. No significant atherosclerotic disease. CTA head: No intracranial large vessel occlusion or proximal high-grade arterial stenosis. Electronically Signed   By: Kellie Simmering DO   On: 02/29/2020 12:59   MR Angiogram Neck W or Wo Contrast  Result Date: 02/28/2020 CLINICAL DATA:  Initial evaluation for intermittent visual disturbance, double vision. EXAM: MRA NECK WITHOUT AND WITH CONTRAST TECHNIQUE: Multiplanar and multiecho pulse sequences of the neck were obtained without and with intravenous contrast. Angiographic images of the neck were obtained using MRA technique without and with intravenous contrast. CONTRAST:  45mL GADAVIST GADOBUTROL 1 MMOL/ML IV SOLN COMPARISON:  Comparison made with corresponding brain MRI performed on the same day. FINDINGS: AORTIC ARCH: Examination mildly degraded by motion artifact.  Visualized aortic arch of normal caliber with normal 3 vessel morphology. No  hemodynamically significant stenosis seen about the origin of the great vessels. Visualized subclavian arteries widely patent. RIGHT CAROTID SYSTEM: Right CCA patent from its origin to the bifurcation without stenosis. No significant atheromatous irregularity or stenosis about the right carotid bifurcation. Right ICA patent distally without stenosis, evidence for dissection, or occlusion. LEFT CAROTID SYSTEM: Left CCA patent from its origin to the bifurcation without stenosis. No significant atheromatous irregularity or stenosis about the left bifurcation. Left ICA patent distally without stenosis, evidence for dissection or occlusion. VERTEBRAL ARTERIES: Both vertebral arteries arise from the subclavian arteries. Neither vertebral artery origin well visualized due to motion artifact. Right vertebral artery dominant. Visualized vertebral arteries patent within the neck without stenosis, evidence for dissection or occlusion. Diminutive left vertebral artery appears to largely terminate in PICA, although a tiny branch ascending towards the vertebrobasilar junction. IMPRESSION: Normal MRA of the neck. No hemodynamically significant stenosis or other acute vascular abnormality. Electronically Signed   By: Jeannine Boga M.D.   On: 02/28/2020 22:34   MR Brain W and Wo Contrast  Result Date: 02/28/2020 CLINICAL DATA:  Initial evaluation for acute diplopia. EXAM: MRI HEAD WITHOUT AND WITH CONTRAST TECHNIQUE: Multiplanar, multiecho pulse sequences of the brain and surrounding structures were obtained without and with intravenous contrast. CONTRAST:  21mL GADAVIST GADOBUTROL 1 MMOL/ML IV SOLN COMPARISON:  Prior head CT from earlier the same day. FINDINGS: Brain: Volume cerebral volume within normal limits for age. Minimal scattered patchy T2/FLAIR hyperintensity within the periventricular white matter and pons, most like related chronic microvascular ischemic disease, mild for age. There are a few scattered  punctate foci of diffusion abnormality seen involving the cortical/subcortical aspects of both parieto-occipital regions (series 5, images 29, 24, 20, 18), consistent with tiny acute to subacute ischemic infarcts. Associated minimal petechial hemorrhage (series 13, image 25). Subtle associated postcontrast enhancement consistent with subacute ischemia (series 32, images 113, 81). No malignant hemorrhagic transformation or significant regional mass effect. No evidence for subarachnoid hemorrhage within this region on prior head CT. No other diffusion abnormality to suggest acute or subacute ischemia. Gray-white matter differentiation otherwise maintained. No other imaging findings of PRES. No other areas of encephalomalacia to suggest chronic cortical infarction. No other evidence for acute or chronic intracranial hemorrhage. No mass lesion, midline shift or mass effect. Ventricles normal size without hydrocephalus. No extra-axial fluid collection. Pituitary gland suprasellar region normal. Midline structures intact. No other abnormal enhancement. Vascular: Major intracranial vascular flow voids are grossly maintained. Skull and upper cervical spine: Craniocervical junction within normal limits. Bone marrow signal intensity normal. No scalp soft tissue abnormality. Sinuses/Orbits: Patient status post bilateral ocular lens replacement. Globes and orbital soft tissues demonstrate no acute finding. Paranasal sinuses are clear. Trace left mastoid effusion noted, of doubtful significance. Inner ear structures grossly normal. Other: None. IMPRESSION: 1. Few scattered punctate acute to subacute ischemic infarcts involving the cortical/subcortical aspects of both parieto-occipital regions. Associated minimal petechial hemorrhage without malignant hemorrhagic transformation or significant regional mass effect. 2. No other acute intracranial abnormality. 3. Underlying mild chronic microvascular ischemic disease. Electronically  Signed   By: Jeannine Boga M.D.   On: 02/28/2020 22:27        Scheduled Meds: .  stroke: mapping our early stages of recovery book   Does not apply Once  . aspirin EC  81 mg Oral Daily  . atorvastatin  40 mg Oral Daily  . citalopram  40 mg  Oral Daily  . clopidogrel  75 mg Oral Daily  . [START ON 03/01/2020] levothyroxine  75 mcg Oral QAC breakfast  . topiramate  25 mg Oral Daily  . Vitamin B-12  1,000 mcg Sublingual Daily   Continuous Infusions:   LOS: 0 days    Time spent: 26 minutes    Sharen Hones, MD [facility]   To contact the attending provider between 7A-7P or the covering provider during after hours 7P-7A, please log into the web site [email or web address] and access using universal [place] password for that web site. If you do not have the password, please call the hospital operator.  02/29/2020, 4:07 PM

## 2020-03-01 DIAGNOSIS — D696 Thrombocytopenia, unspecified: Secondary | ICD-10-CM

## 2020-03-01 DIAGNOSIS — I639 Cerebral infarction, unspecified: Secondary | ICD-10-CM | POA: Diagnosis not present

## 2020-03-01 MED ORDER — CLOPIDOGREL BISULFATE 75 MG PO TABS: 75.0000 mg | ORAL_TABLET | Freq: Every day | ORAL | 0 refills | Status: AC

## 2020-03-01 MED ORDER — ATORVASTATIN CALCIUM 40 MG PO TABS: 40.0000 mg | ORAL_TABLET | Freq: Every day | ORAL | 0 refills | Status: DC

## 2020-03-01 NOTE — Progress Notes (Signed)
SLP Cancellation Note  Patient Details Name: Natalie [name] MRN: [id number] DOB: 09/22/1950   Cancelled treatment:       Reason Eval/Treat Not Completed: SLP screened, no needs identified, will sign off   Late entry for 02/29/2020: Chart review, nursing consulted and pt interviewed. No acute cognitive linguistic deficits were identified.   Tanaya Dunigan B. Rutherford Nail M.S., CCC-SLP, [place] Office [phone]   Stormy Fabian 03/01/2020, 7:58 AM

## 2020-03-01 NOTE — ED Notes (Signed)
Pt assisted to and from the bathroom. Pt with steady gait. Pt provided meal tray

## 2020-03-01 NOTE — Progress Notes (Signed)
Neurology Progress Note   S:// Seen and examined.  No acute changes.   O:// Current vital signs: BP 129/63   Pulse 63   Temp 98.5 F (36.9 C) (Oral)   Resp 18   SpO2 98%  Vital signs in last 24 hours: Pulse Rate:  [58-78] 63 (01/27 0745) Resp:  [14-27] 18 (01/27 0745) BP: (127-143)/(58-94) 129/63 (01/27 0745) SpO2:  [95 %-100 %] 98 % (01/27 0745) UNCHANGED FROM YESTERDAY GENERAL: Awake, alert in NAD HEENT: - Normocephalic and atraumatic, dry mm, no LN++, no Thyromegally LUNGS - Clear to auscultation bilaterally with no wheezes CV - S1S2 RRR, no m/r/g, equal pulses bilaterally. ABDOMEN - Soft, nontender, nondistended with normoactive BS Ext: warm, well perfused, intact peripheral pulses, no edema NEURO:  Mental Status: AA&Ox3  Language: speech is nondysarthric.  Naming, repetition, fluency, and comprehension intact. Cranial Nerves: PERRL. EOMI, visual fields full, no facial asymmetry, facial sensation intact, hearing intact, tongue/uvula/soft palate midline, normal sternocleidomastoid and trapezius muscle strength. No evidence of tongue atrophy or fibrillations Motor: 5/5 in all fours.  She has a resting and action tremor in both extremities which she says is intermittent. Tone: is normal and bulk is normal, no cogwheeling Sensation-mildly diminished to all modalities on the right. Coordination: FTN intact bilaterally, no ataxia in BLE.  Tremor as above Gait- deferred  NIHSS-1 for sensory  Medications  Current Facility-Administered Medications:  .   stroke: mapping our early stages of recovery book, , Does not apply, Once, Athena Masse, MD .  acetaminophen (TYLENOL) tablet 650 mg, 650 mg, Oral, Q4H PRN **OR** acetaminophen (TYLENOL) 160 MG/5ML solution 650 mg, 650 mg, Per Tube, Q4H PRN **OR** acetaminophen (TYLENOL) suppository 650 mg, 650 mg, Rectal, Q4H PRN, Athena Masse, MD .  aspirin EC tablet 81 mg, 81 mg, Oral, Daily, Amie Portland, MD, 81 mg at 02/29/20  1240 .  atorvastatin (LIPITOR) tablet 40 mg, 40 mg, Oral, Daily, Judd Gaudier V, MD, 40 mg at 02/29/20 1240 .  buPROPion [facility] SR) 12 hr tablet 150 mg, 150 mg, Oral, BID, Sharen Hones, MD, 150 mg at 02/29/20 1934 .  citalopram (CELEXA) tablet 40 mg, 40 mg, Oral, Daily, Roosevelt Locks, Dekui, MD .  clopidogrel (PLAVIX) tablet 75 mg, 75 mg, Oral, Daily, Amie Portland, MD, 75 mg at 02/29/20 1239 .  levothyroxine (SYNTHROID) tablet 75 mcg, 75 mcg, Oral, QAC breakfast, Sharen Hones, MD .  topiramate (TOPAMAX) tablet 25 mg, 25 mg, Oral, Daily, Sharen Hones, MD, 25 mg at 02/29/20 1716 .  vitamin B-12 (CYANOCOBALAMIN) tablet 1,000 mcg, 1,000 mcg, Oral, Daily, Sharen Hones, MD  Current Outpatient Medications:  .  aspirin EC 81 MG tablet, Take 81 mg by mouth See admin instructions. Take 1 tablet 4 times a week., Disp: , Rfl:  .  buPROPion (WELLBUTRIN SR) 150 MG 12 hr tablet, Take 150 mg by mouth 2 (two) times daily., Disp: , Rfl:  .  citalopram (CELEXA) 40 MG tablet, Take 40 mg by mouth daily., Disp: , Rfl:  .  clindamycin-benzoyl peroxide (BENZACLIN) gel, Apply topically 2 (two) times daily., Disp: 50 g, Rfl: 0 .  Cyanocobalamin (VITAMIN B-12) 1000 MCG SUBL, Place 1,000 mcg under the tongue daily., Disp: , Rfl:  .  levothyroxine (SYNTHROID) 75 MCG tablet, Take 1 tablet (75 mcg total) by mouth daily before breakfast., Disp: 90 tablet, Rfl: 1 .  Multiple Vitamins-Minerals (MULTIVITAMIN WOMEN 50+ PO), Take 1 tablet by mouth daily., Disp: , Rfl:  .  Omega-3 Fatty Acids (FISH OIL)  1000 MG CAPS, Take 1 capsule by mouth See admin instructions. Take 1 capsule 4 times a week., Disp: , Rfl:  .  SUMAtriptan (IMITREX) 100 MG tablet, May repeat in 2 hours if headache persists or recurs., Disp: 10 tablet, Rfl: 0 .  topiramate (TOPAMAX) 25 MG tablet, Take 25 mg by mouth daily. , Disp: , Rfl:  .  Vitamin D, Ergocalciferol, (DRISDOL) 1.25 MG (50000 UNIT) CAPS capsule, TAKE 1 CAPSULE BY MOUTH EVERY 7 DAYS, Disp: 12 capsule,  Rfl: 1 Labs CBC    Component Value Date/Time   WBC 6.9 02/28/2020 0950   RBC 4.76 02/28/2020 0950   HGB 14.7 02/28/2020 0950   HGB 14.6 06/28/2015 1600   HCT 44.5 02/28/2020 0950   HCT 44.0 06/28/2015 1600   PLT 108 (L) 02/28/2020 0950   PLT 245 06/28/2015 1600   MCV 93.5 02/28/2020 0950   MCV 92 06/28/2015 1600   MCH 30.9 02/28/2020 0950   MCHC 33.0 02/28/2020 0950   RDW 12.1 02/28/2020 0950   RDW 14.2 06/28/2015 1600   LYMPHSABS 1,195 01/25/2020 0820   LYMPHSABS 1.7 06/28/2015 1600   EOSABS 220 01/25/2020 0820   EOSABS 0.2 06/28/2015 1600   BASOSABS 29 01/25/2020 0820   BASOSABS 0.0 06/28/2015 1600    CMP     Component Value Date/Time   NA 141 02/28/2020 0950   NA 140 06/28/2015 1600   K 3.7 02/28/2020 0950   CL 104 02/28/2020 0950   CO2 24 02/28/2020 0950   GLUCOSE 104 (H) 02/28/2020 0950   BUN 16 02/28/2020 0950   BUN 12 06/28/2015 1600   CREATININE 0.93 02/28/2020 0950   CREATININE 0.94 01/25/2020 0820   CALCIUM 9.5 02/28/2020 0950   PROT 7.1 01/25/2020 0820   PROT 6.9 06/28/2015 1600   ALBUMIN 4.0 04/30/2016 0907   ALBUMIN 4.3 06/28/2015 1600   AST 17 01/25/2020 0820   ALT 11 01/25/2020 0820   ALKPHOS 76 04/30/2016 0907   BILITOT 0.6 01/25/2020 0820   BILITOT 0.6 06/28/2015 1600   GFRNONAA >60 02/28/2020 0950   GFRNONAA 62 01/25/2020 0820   GFRAA 72 01/25/2020 0820    glycosylated hemoglobin-5.0  Lipid Panel     Component Value Date/Time   CHOL 165 02/29/2020 0547   CHOL 185 06/28/2015 1600   TRIG 97 02/29/2020 0547   HDL 38 (L) 02/29/2020 0547   HDL 33 (L) 06/28/2015 1600   CHOLHDL 4.3 02/29/2020 0547   VLDL 19 02/29/2020 0547   LDLCALC 108 (H) 02/29/2020 0547   LDLCALC 118 (H) 05/10/2019 1458   2D ECHO IMPRESSIONS  1. Left ventricular ejection fraction, by estimation, is 60 to 65%. The  left ventricle has normal function. The left ventricle has no regional  wall motion abnormalities. Left ventricular diastolic parameters were   normal.  2. Right ventricular systolic function is normal. The right ventricular  size is normal.  3. The mitral valve is grossly normal. Trivial mitral valve  regurgitation.  4. The aortic valve is grossly normal. Aortic valve regurgitation is  trivial.    Imaging I have reviewed images in epic and the results pertinent to this consultation are: MRI BRAIN IMPRESSION: 1. Few scattered punctate acute to subacute ischemic infarcts involving the cortical/subcortical aspects of both parieto-occipital regions. Associated minimal petechial hemorrhage without malignant hemorrhagic transformation or significant regional mass effect. 2. No other acute intracranial abnormality. 3. Underlying mild chronic microvascular ischemic disease.  CTA HEAD AND NECK IMPRESSION: CT head: No CT evidence of  acute intracranial abnormality. A few punctate acute/subacute infarcts were noted within the bilateral parietooccipital lobes on the recent prior MRI of 02/28/2020. CTA neck: The common carotid, internal carotid and vertebral arteries are patent within the neck without stenosis. No significant atherosclerotic disease. CTA head: No intracranial large vessel occlusion or proximal high-grade arterial stenosis.  Assessment: 70 year old woman with complaints of double vision and dizziness that have happened multiple times but most recent episode prior to presentation the day prior-noted to have scattered punctate cortical subcortical right occipital parietal infarcts with minimal petechial hemorrhage without frank or malignant hemorrhagic transformation. CT head and neck unremarkable Telemetry thus far unremarkable for atrial fibrillation LA size normal on 2D echo Etiology remains cryptogenic but strong suspicion for cardioembolic source.  Therapy assessments have identified no outpatient therapy needs.  Recommendations: -Dual antiplatelets for 3 weeks followed by aspirin only. -30-day  heart monitoring upon discharge.  Follow-up with cardiology for possible TEE and loop recorder placement.  I discussed the option of doing this potentially inpatient with the patient would rather do this as an outpatient. -Blood pressure goal normotensive on discharge -Follow-up with cardiology in 4 to 6 weeks -Follow-up with outpatient neurology in 4 to 6 weeks-has an appointment with Dr. Manuella Ghazi at [place] [facility] in February.  Discussed my plan in detail with the patient. Relayed my plan to primary hospitalist Dr. Roosevelt Locks  -- Amie Portland, MD Neurologist [facility] Pager: [phone]

## 2020-03-01 NOTE — Discharge Summary (Signed)
Physician Discharge Summary  Patient ID: Natalie [name] MRN: [id number] DOB/AGE: 70/13/1952 70 y.o.  Admit date: 02/28/2020 Discharge date: 03/01/2020  Admission Diagnoses:  Discharge Diagnoses:  Principal Problem:   Acute CVA (cerebrovascular accident) [facility]) Active Problems:   Other specified hypothyroidism   Moderate major depression ([place])   Migraine without aura and without status migrainosus, not intractable   Thrombocytopenia (HCC)   Acute arterial ischemic stroke, multifocal, posterior circulation [facility])   Discharged Condition: good  Hospital Course:  Natalie [name] a 70 y.o.femalewith medical history significant forDepression, hypothyroidism, migraines, who presents to the emergency room with a 1 month history of intermittent double vision lasting about a minute or 2. At first it happened about 3 times a week but over the past weekend she had 3 episodes and they lasted longer than usual, additionally she had one episode where she lost vision in part of her visual field in one eye. Patient has been seen by neurology.  MRI of the brain showed a bilateral posterior ischemic changes.  CT angiogram neck and head negative.  Patient is treated with aspirin Plavix and statin.  #1.  Acute ischemic stroke at the bilateral posterior circulation. Patient no longer has any symptoms. She is medically stable to be discharged. Continue aspirin, Plavix and Lipitor. She will be followed by PCP in 1 week, neurology in 4 weeks. She before also followed by cardiology to consider loop recorder and TEE as outpatient.    #2.  Hypothyroidism. Continue home medicines.  3.  Migraine headaches. Continue torsemide.    Consults: neurology  Significant Diagnostic Studies:  Echo: 1. Left ventricular ejection fraction, by estimation, is 60 to 65%. The left ventricle has normal function. The left ventricle has no regional wall motion abnormalities. Left ventricular diastolic  parameters were normal. 2. Right ventricular systolic function is normal. The right ventricular size is normal. 3. The mitral valve is grossly normal. Trivial mitral valve regurgitation. 4. The aortic valve is grossly normal. Aortic valve regurgitation is trivial.  CT ANGIOGRAPHY HEAD AND NECK  TECHNIQUE: Multidetector CT imaging of the head and neck was performed using the standard protocol during bolus administration of intravenous contrast. Multiplanar CT image reconstructions and MIPs were obtained to evaluate the vascular anatomy. Carotid stenosis measurements (when applicable) are obtained utilizing NASCET criteria, using the distal internal carotid diameter as the denominator.  CONTRAST:  47mL OMNIPAQUE IOHEXOL 350 MG/ML SOLN  COMPARISON:  MRA neck 02/28/2020. MRI brain 02/28/2020. Head CT 02/28/2020.  FINDINGS: CT HEAD FINDINGS  Brain:  Cerebral volume is normal.  A few punctate acute/subacute infarcts or present within the bilateral parietooccipital lobes on the MRI of 02/28/2020. No evidence of interval infarct.  There is no acute intracranial hemorrhage.  No extra-axial fluid collection.  No evidence of intracranial mass.  No midline shift.  Vascular: Reported below.  Skull: Normal. Negative for fracture or focal lesion.  Sinuses: No significant paranasal sinus disease.  Orbits: No mass or acute finding.  Review of the MIP images confirms the above findings  CTA NECK FINDINGS  Aortic arch: Standard aortic branching. The visualized aortic arch is unremarkable. No hemodynamically significant innominate or proximal subclavian artery stenosis.  Right carotid system: CCA and ICA patent within the neck without stenosis. No significant atherosclerotic disease. Tortuosity of the cervical ICA.  Left carotid system: CCA and ICA patent within neck without stenosis. No significant atherosclerotic disease. Tortuosity of the cervical  ICA.  Vertebral arteries: Patent within the neck without stenosis. The right  vertebral artery is dominant.  Skeleton: No acute bony abnormality or aggressive osseous lesion. Cervical spondylosis with multilevel disc space narrowing, disc bulges, posterior disc osteophytes and uncovertebral hypertrophy.  Other neck: No neck mass or cervical lymphadenopathy. Thyroid unremarkable.  Upper chest: No consolidation within the imaged lung apices.  Review of the MIP images confirms the above findings  CTA HEAD FINDINGS  Anterior circulation:  The intracranial internal carotid arteries are patent. The M1 middle cerebral arteries are patent. No M2 proximal branch occlusion or high-grade proximal stenosis is identified. The anterior cerebral arteries are patent. No intracranial aneurysm is identified.  Posterior circulation:  The dominant intracranial right vertebral artery is patent without stenosis. The non dominant intracranial left vertebral artery is developmentally diminutive and terminates predominantly as the left PICA, with only a small contribution to the basilar artery. The basilar artery is patent. The posterior cerebral arteries are patent. A left posterior communicating artery is present. The right posterior communicating artery is hypoplastic or absent.  Venous sinuses: Within the limitations of contrast timing, no convincing thrombus. Large arachnoid granulation within the distal right transverse dural venous sinus.  Anatomic variants: As described  Review of the MIP images confirms the above findings  IMPRESSION: CT head:  No CT evidence of acute intracranial abnormality. A few punctate acute/subacute infarcts were noted within the bilateral parietooccipital lobes on the recent prior MRI of 02/28/2020.  CTA neck:  The common carotid, internal carotid and vertebral arteries are patent within the neck without stenosis. No  significant atherosclerotic disease.  CTA head:  No intracranial large vessel occlusion or proximal high-grade arterial stenosis.   Electronically Signed   By: Kellie Simmering DO   On: 02/29/2020 12:59  MRI HEAD WITHOUT AND WITH CONTRAST  TECHNIQUE: Multiplanar, multiecho pulse sequences of the brain and surrounding structures were obtained without and with intravenous contrast.  CONTRAST:  63mL GADAVIST GADOBUTROL 1 MMOL/ML IV SOLN  COMPARISON:  Prior head CT from earlier the same day.  FINDINGS: Brain: Volume cerebral volume within normal limits for age. Minimal scattered patchy T2/FLAIR hyperintensity within the periventricular white matter and pons, most like related chronic microvascular ischemic disease, mild for age.  There are a few scattered punctate foci of diffusion abnormality seen involving the cortical/subcortical aspects of both parieto-occipital regions (series 5, images 29, 24, 20, 18), consistent with tiny acute to subacute ischemic infarcts. Associated minimal petechial hemorrhage (series 13, image 25). Subtle associated postcontrast enhancement consistent with subacute ischemia (series 32, images 113, 81). No malignant hemorrhagic transformation or significant regional mass effect. No evidence for subarachnoid hemorrhage within this region on prior head CT.  No other diffusion abnormality to suggest acute or subacute ischemia. Gray-white matter differentiation otherwise maintained. No other imaging findings of PRES. No other areas of encephalomalacia to suggest chronic cortical infarction. No other evidence for acute or chronic intracranial hemorrhage.  No mass lesion, midline shift or mass effect. Ventricles normal size without hydrocephalus. No extra-axial fluid collection. Pituitary gland suprasellar region normal. Midline structures intact.  No other abnormal enhancement.  Vascular: Major intracranial vascular flow voids are  grossly maintained.  Skull and upper cervical spine: Craniocervical junction within normal limits. Bone marrow signal intensity normal. No scalp soft tissue abnormality.  Sinuses/Orbits: Patient status post bilateral ocular lens replacement. Globes and orbital soft tissues demonstrate no acute finding. Paranasal sinuses are clear. Trace left mastoid effusion noted, of doubtful significance. Inner ear structures grossly normal.  Other: None.  IMPRESSION: 1. Few scattered punctate acute  to subacute ischemic infarcts involving the cortical/subcortical aspects of both parieto-occipital regions. Associated minimal petechial hemorrhage without malignant hemorrhagic transformation or significant regional mass effect. 2. No other acute intracranial abnormality. 3. Underlying mild chronic microvascular ischemic disease.   Electronically Signed   By: Jeannine Boga M.D.   On: 02/28/2020 22:27  MRA NECK WITHOUT AND WITH CONTRAST  TECHNIQUE: Multiplanar and multiecho pulse sequences of the neck were obtained without and with intravenous contrast. Angiographic images of the neck were obtained using MRA technique without and with intravenous contrast.  CONTRAST:  73mL GADAVIST GADOBUTROL 1 MMOL/ML IV SOLN  COMPARISON:  Comparison made with corresponding brain MRI performed on the same day.  FINDINGS: AORTIC ARCH: Examination mildly degraded by motion artifact.  Visualized aortic arch of normal caliber with normal 3 vessel morphology. No hemodynamically significant stenosis seen about the origin of the great vessels. Visualized subclavian arteries widely patent.  RIGHT CAROTID SYSTEM: Right CCA patent from its origin to the bifurcation without stenosis. No significant atheromatous irregularity or stenosis about the right carotid bifurcation. Right ICA patent distally without stenosis, evidence for dissection, or occlusion.  LEFT CAROTID SYSTEM: Left CCA  patent from its origin to the bifurcation without stenosis. No significant atheromatous irregularity or stenosis about the left bifurcation. Left ICA patent distally without stenosis, evidence for dissection or occlusion.  VERTEBRAL ARTERIES: Both vertebral arteries arise from the subclavian arteries. Neither vertebral artery origin well visualized due to motion artifact. Right vertebral artery dominant. Visualized vertebral arteries patent within the neck without stenosis, evidence for dissection or occlusion. Diminutive left vertebral artery appears to largely terminate in PICA, although a tiny branch ascending towards the vertebrobasilar junction.  IMPRESSION: Normal MRA of the neck. No hemodynamically significant stenosis or other acute vascular abnormality.   Electronically Signed   By: Jeannine Boga M.D.   On: 02/28/2020 22:34    Treatments: stroke workup  Discharge Exam: Blood pressure 129/63, pulse 63, temperature 98.5 F (36.9 C), temperature source Oral, resp. rate 18, SpO2 98 %. General appearance: alert and cooperative Resp: clear to auscultation bilaterally Cardio: regular rate and rhythm, S1, S2 normal, no murmur, click, rub or gallop GI: soft, non-tender; bowel sounds normal; no masses,  no organomegaly Extremities: extremities normal, atraumatic, no cyanosis or edema Neurologic: Alert and oriented X 3, normal strength and tone. Normal symmetric reflexes. Normal coordination and gait  Disposition: Discharge disposition: 01-Home or Self Care       Discharge Instructions    Diet - low sodium heart healthy   Complete by: As directed    Increase activity slowly   Complete by: As directed      Allergies as of 03/01/2020      Reactions   Sulfamethoxazole-trimethoprim Hives      Medication List    TAKE these medications   aspirin EC 81 MG tablet Take 81 mg by mouth See admin instructions. Take 1 tablet 4 times a week.   atorvastatin 40 MG  tablet Commonly known as: LIPITOR Take 1 tablet (40 mg total) by mouth daily. Start taking on: March 02, 2020   buPROPion 150 MG 12 hr tablet Commonly known as: WELLBUTRIN SR Take 150 mg by mouth 2 (two) times daily.   citalopram 40 MG tablet Commonly known as: CELEXA Take 40 mg by mouth daily.   clindamycin-benzoyl peroxide gel Commonly known as: BenzaClin Apply topically 2 (two) times daily.   clopidogrel 75 MG tablet Commonly known as: PLAVIX Take 1 tablet (75 mg total)  by mouth daily for 21 days. Start taking on: March 02, 2020   Fish Oil 1000 MG Caps Take 1 capsule by mouth See admin instructions. Take 1 capsule 4 times a week.   levothyroxine 75 MCG tablet Commonly known as: SYNTHROID Take 1 tablet (75 mcg total) by mouth daily before breakfast.   MULTIVITAMIN WOMEN 50+ PO Take 1 tablet by mouth daily.   SUMAtriptan 100 MG tablet Commonly known as: IMITREX May repeat in 2 hours if headache persists or recurs.   topiramate 25 MG tablet Commonly known as: TOPAMAX Take 25 mg by mouth daily.   Vitamin B-12 1000 MCG Subl Place 1,000 mcg under the tongue daily.   Vitamin D (Ergocalciferol) 1.25 MG (50000 UNIT) Caps capsule Commonly known as: DRISDOL TAKE 1 CAPSULE BY MOUTH EVERY 7 DAYS       Follow-up Information    [name], MD Follow up in 1 week(s).   Specialty: Family Medicine Contact information: [address] [place] [address] [phone]        Yolonda Kida, MD Follow up in 2 week(s).   Specialties: Cardiology, Internal Medicine Contact information: [address] [place] [address] [phone]        [facility] Follow up in 4 week(s).   Contact information: [place] [phone]              Signed: Sharen Hones 03/01/2020, 9:51 AM

## 2020-03-07 DIAGNOSIS — F330 Major depressive disorder, recurrent, mild: Secondary | ICD-10-CM | POA: Diagnosis not present

## 2020-03-07 NOTE — Progress Notes (Signed)
Name: Natalie [name]   MRN: [id number]    DOB: 14-Aug-1950   Date:03/08/2020       Progress Note  [facility] Follow Up- Stroke  HPI  Hospital discharge follow up:  Admission date 02/28/2020 Discharge date 03/01/2020   Discharge diagnosis: acute CVA, multifocal, posterior circulation. She went in due to recurrent episodes of double vision, that got progressively worse. She had abnormal MRI, CT angiogram of neck and head was negative. She was sent home on aspirin , plavix and statin therapy . Per neurologist visit may stop Plavix after 3 weeks from discharge. She states only taking aspirin about 4 times a week, advised to take it daily per discharge summary.  She has an appointment with Dr. Manuella Ghazi ( neurologist) Feb 28 th,  and also with  Dr. Clayborn Bigness ( cardiologist)  next week -  for loop recorder and TEE   Medication reviewed with patient  She states no symptoms of double vision or any other neuro deficit since she was discharged home   Patient Active Problem List   Diagnosis Date Noted  . Acute arterial ischemic stroke, multifocal, posterior circulation ([place]) 02/29/2020  . Acute CVA (cerebrovascular accident) ([place]) 02/28/2020  . Thrombocytopenia ([place]) 02/28/2020  . HPV test positive 07/08/2015  . Difficulty hearing 06/28/2015  . Arthritis, degenerative 06/28/2015  . Obesity, Class I, BMI 30-34.9 06/28/2015  . Paresthesia 06/28/2015  . Purpura, nonthrombopenic ([place]) 06/28/2015  . Skin lesion of right leg 06/28/2015  . Other specified hypothyroidism 08/09/2014  . Migraine without aura and without status migrainosus, not intractable 09/19/2008  . Moderate major depression ([place]) 09/07/2006  . OP (osteoporosis) 09/07/2006    Past Surgical History:  Procedure Laterality Date  . ABLATION  2006  . CATARACT EXTRACTION W/ INTRAOCULAR LENS IMPLANT Bilateral   . COLONOSCOPY    . COLONOSCOPY WITH PROPOFOL N/A 12/22/2018   Procedure: COLONOSCOPY WITH PROPOFOL;   Surgeon: Virgel Manifold, MD;  Location: [facility] ENDOSCOPY;  Service: Endoscopy;  Laterality: N/A;  . EYE SURGERY  Spring 2018   Cataracts both eyes  . TONSILLECTOMY      Family History  Problem Relation Age of Onset  . Early death Father        suicide  . Depression Father   . Diabetes Father   . Cancer Father        prostate  . Hearing loss Father        due to war  . Alzheimer's disease Mother   . Heart disease Brother   . Heart disease Maternal Aunt   . Dementia Maternal Aunt   . Heart disease Maternal Uncle   . Dementia Maternal Grandmother   . Heart disease Brother     Social History   Tobacco Use  . Smoking status: Former Smoker    Packs/day: 0.50    Years: 6.00    Pack years: 3.00    Types: Cigarettes    Quit date: 02/11/1974    Years since quitting: 46.1  . Smokeless tobacco: Never Used  Substance Use Topics  . Alcohol use: Yes    Alcohol/week: 0.0 standard drinks    Comment: 2-3 drinks monthly     Current Outpatient Medications:  .  aspirin EC 81 MG tablet, Take 81 mg by mouth See admin instructions. Take 1 tablet 4 times a week., Disp: , Rfl:  .  atorvastatin (LIPITOR) 40 MG tablet, Take 1 tablet (40 mg total) by mouth daily., Disp: 30 tablet,  Rfl: 0 .  buPROPion (WELLBUTRIN SR) 150 MG 12 hr tablet, Take 150 mg by mouth 2 (two) times daily., Disp: , Rfl:  .  citalopram (CELEXA) 40 MG tablet, Take 40 mg by mouth daily., Disp: , Rfl:  .  clindamycin-benzoyl peroxide (BENZACLIN) gel, Apply topically 2 (two) times daily., Disp: 50 g, Rfl: 0 .  clopidogrel (PLAVIX) 75 MG tablet, Take 1 tablet (75 mg total) by mouth daily for 21 days., Disp: 21 tablet, Rfl: 0 .  Cyanocobalamin (VITAMIN B-12) 1000 MCG SUBL, Place 1,000 mcg under the tongue daily., Disp: , Rfl:  .  levothyroxine (SYNTHROID) 75 MCG tablet, Take 1 tablet (75 mcg total) by mouth daily before breakfast., Disp: 90 tablet, Rfl: 1 .  Multiple Vitamins-Minerals (MULTIVITAMIN WOMEN 50+ PO), Take 1  tablet by mouth daily., Disp: , Rfl:  .  Omega-3 Fatty Acids (FISH OIL) 1000 MG CAPS, Take 1 capsule by mouth See admin instructions. Take 1 capsule 4 times a week., Disp: , Rfl:  .  SUMAtriptan (IMITREX) 100 MG tablet, May repeat in 2 hours if headache persists or recurs., Disp: 10 tablet, Rfl: 0 .  topiramate (TOPAMAX) 25 MG tablet, Take 25 mg by mouth daily. , Disp: , Rfl:  .  Vitamin D, Ergocalciferol, (DRISDOL) 1.25 MG (50000 UNIT) CAPS capsule, TAKE 1 CAPSULE BY MOUTH EVERY 7 DAYS, Disp: 12 capsule, Rfl: 1  Allergies  Allergen Reactions  . Sulfamethoxazole-Trimethoprim Hives    I personally reviewed active problem list, medication list, allergies, family history, social history with the patient/caregiver today.   ROS  Constitutional: Negative for fever, positive for  weight change - she has been losing weight by avoiding carbs.  Respiratory: Negative for cough and shortness of breath.   Cardiovascular: Negative for chest pain or palpitations.  Gastrointestinal: Negative for abdominal pain, no bowel changes.  Musculoskeletal: Negative for gait problem or joint swelling.  Skin: Negative for rash.  Neurological: Negative for dizziness or headache.  No other specific complaints in a complete review of systems (except as listed in HPI above).  Objective  Vitals:   03/08/20 1106  BP: 122/80  Pulse: 82  Resp: 16  Temp: 97.9 F (36.6 C)  TempSrc: Oral  SpO2: 95%  Weight: 213 lb 3.2 oz (96.7 kg)  Height: 5\' 6"  (1.676 m)    Body mass index is 34.41 kg/m.  Physical Exam  Constitutional: Patient appears well-developed and well-nourished. Obese  No distress.  HEENT: head atraumatic, normocephalic, pupils equal and reactive to light, neck supple Cardiovascular: Normal rate, regular rhythm and normal heart sounds.  No murmur heard. No BLE edema. Pulmonary/Chest: Effort normal and breath sounds normal. No respiratory distress. Abdominal: Soft.  There is no  tenderness. Neurological: normal exam, normal cranial nerves Psychiatric: Patient has a normal mood and affect. behavior is normal. Judgment and thought content normal.  Recent Results (from the past 2160 hour(s))  Vitamin B12     Status: None   Collection Time: 01/25/20  8:20 AM  Result Value Ref Range   Vitamin B-12 758 200 - 1,100 pg/mL  Hemoglobin A1c     Status: None   Collection Time: 01/25/20  8:20 AM  Result Value Ref Range   Hgb A1c MFr Bld 5.2 <5.7 % of total Hgb    Comment: For the purpose of screening for the presence of diabetes: . <5.7%       Consistent with the absence of diabetes 5.7-6.4%    Consistent with increased risk for diabetes             (  prediabetes) > or =6.5%  Consistent with diabetes . This assay result is consistent with a decreased risk of diabetes. . Currently, no consensus exists regarding use of hemoglobin A1c for diagnosis of diabetes in children. . According to American Diabetes Association (ADA) guidelines, hemoglobin A1c <7.0% represents optimal control in non-pregnant diabetic patients. Different metrics may apply to specific patient populations.  Standards of Medical Care in Diabetes(ADA). .    Mean Plasma Glucose 103 mg/dL   eAG (mmol/L) 5.7 mmol/L  COMPLETE METABOLIC PANEL WITH GFR     Status: Abnormal   Collection Time: 01/25/20  8:20 AM  Result Value Ref Range   Glucose, Bld 98 65 - 99 mg/dL    Comment: .            Fasting reference interval .    BUN 20 7 - 25 mg/dL   Creat 0.94 0.50 - 0.99 mg/dL    Comment: For patients >29 years of age, the reference limit for Creatinine is approximately 13% higher for people identified as African-American. .    GFR, Est Non African American 62 > OR = 60 mL/min/1.69m2   GFR, Est African American 72 > OR = 60 mL/min/1.64m2   BUN/Creatinine Ratio NOT APPLICABLE 6 - 22 (calc)   Sodium 141 135 - 146 mmol/L   Potassium 3.4 (L) 3.5 - 5.3 mmol/L   Chloride 107 98 - 110 mmol/L   CO2 25 20  - 32 mmol/L   Calcium 9.2 8.6 - 10.4 mg/dL   Total Protein 7.1 6.1 - 8.1 g/dL   Albumin 4.2 3.6 - 5.1 g/dL   Globulin 2.9 1.9 - 3.7 g/dL (calc)   AG Ratio 1.4 1.0 - 2.5 (calc)   Total Bilirubin 0.6 0.2 - 1.2 mg/dL   Alkaline phosphatase (APISO) 187 (H) 37 - 153 U/L   AST 17 10 - 35 U/L   ALT 11 6 - 29 U/L  CBC with Differential/Platelet     Status: None   Collection Time: 01/25/20  8:20 AM  Result Value Ref Range   WBC 5.8 3.8 - 10.8 Thousand/uL   RBC 4.63 3.80 - 5.10 Million/uL   Hemoglobin 14.7 11.7 - 15.5 g/dL   HCT 44.0 35.0 - 45.0 %   MCV 95.0 80.0 - 100.0 fL   MCH 31.7 27.0 - 33.0 pg   MCHC 33.4 32.0 - 36.0 g/dL   RDW 12.1 11.0 - 15.0 %   Platelets 201 140 - 400 Thousand/uL   MPV 9.9 7.5 - 12.5 fL   Neutro Abs 4,054 1,500 - 7,800 cells/uL   Lymphs Abs 1,195 850 - 3,900 cells/uL   Absolute Monocytes 302 200 - 950 cells/uL   Eosinophils Absolute 220 15 - 500 cells/uL   Basophils Absolute 29 0 - 200 cells/uL   Neutrophils Relative % 69.9 %   Total Lymphocyte 20.6 %   Monocytes Relative 5.2 %   Eosinophils Relative 3.8 %   Basophils Relative 0.5 %  TSH     Status: None   Collection Time: 01/25/20  8:20 AM  Result Value Ref Range   TSH 2.79 0.40 - 4.50 mIU/L  CBC     Status: Abnormal   Collection Time: 02/28/20  9:50 AM  Result Value Ref Range   WBC 6.9 4.0 - 10.5 K/uL   RBC 4.76 3.87 - 5.11 MIL/uL   Hemoglobin 14.7 12.0 - 15.0 g/dL   HCT 44.5 36.0 - 46.0 %   MCV 93.5 80.0 - 100.0 fL  MCH 30.9 26.0 - 34.0 pg   MCHC 33.0 30.0 - 36.0 g/dL   RDW 12.1 11.5 - 15.5 %   Platelets 108 (L) 150 - 400 K/uL    Comment: Immature Platelet Fraction may be clinically indicated, consider ordering this additional test [id number]    nRBC 0.0 0.0 - 0.2 %    Comment: Performed at [facility], [address], [place], [place] [id number]  Basic metabolic panel     Status: Abnormal   Collection Time: 02/28/20  9:50 AM  Result Value Ref Range   Sodium 141 135 - 145 mmol/L    Potassium 3.7 3.5 - 5.1 mmol/L   Chloride 104 98 - 111 mmol/L   CO2 24 22 - 32 mmol/L   Glucose, Bld 104 (H) 70 - 99 mg/dL    Comment: Glucose reference range applies only to samples taken after fasting for at least 8 hours.   BUN 16 8 - 23 mg/dL   Creatinine, Ser 0.93 0.44 - 1.00 mg/dL   Calcium 9.5 8.9 - 10.3 mg/dL   GFR, Estimated >60 >60 mL/min    Comment: (NOTE) Calculated using the CKD-EPI Creatinine Equation (2021)    Anion gap 13 5 - 15    Comment: Performed at [facility], [address] [place], [place] [address]  SARS CORONAVIRUS 2 (TAT 6-24 HRS) Nasopharyngeal Nasopharyngeal Swab     Status: None   Collection Time: 02/28/20 11:19 PM   Specimen: Nasopharyngeal Swab  Result Value Ref Range   SARS Coronavirus 2 NEGATIVE NEGATIVE    Comment: (NOTE) SARS-CoV-2 target nucleic acids are NOT DETECTED.  The SARS-CoV-2 RNA is generally detectable in upper and lower respiratory specimens during the acute phase of infection. Negative results do not preclude SARS-CoV-2 infection, do not rule out co-infections with other pathogens, and should not be used as the sole basis for treatment or other patient management decisions. Negative results must be combined with clinical observations, patient history, and epidemiological information. The expected result is Negative.  Fact Sheet for Patients: SugarRoll.be  Fact Sheet for Healthcare Providers: https://www.woods-mathews.com/  This test is not yet approved or cleared by the [place] FDA and  has been authorized for detection and/or diagnosis of SARS-CoV-2 by FDA under an Emergency Use Authorization (EUA). This EUA will remain  in effect (meaning this test can be used) for the duration of the COVID-19 declaration under Se ction 564(b)(1) of the Act, 21 U.S.C. section 360bbb-3(b)(1), unless the authorization is terminated or revoked sooner.  Performed at [facility], [place] [address] [place], [place] [address]   HIV Antibody (routine testing w rflx)     Status: None   Collection Time: 02/29/20  5:47 AM  Result Value Ref Range   HIV Screen 4th Generation wRfx Non Reactive Non Reactive    Comment: Performed at [facility], [place] [address], [place], [place] [address]  Hemoglobin A1c     Status: None   Collection Time: 02/29/20  5:47 AM  Result Value Ref Range   Hgb A1c MFr Bld 5.0 4.8 - 5.6 %    Comment: (NOTE) Pre diabetes:          5.7%-6.4%  Diabetes:              >6.4%  Glycemic control for   <7.0% adults with diabetes    Mean Plasma Glucose 96.8 mg/dL    Comment: Performed at [facility] [address]  [place], [place] [address]  Lipid panel     Status: Abnormal   Collection Time: 02/29/20  5:47 AM  Result Value Ref Range   Cholesterol 165 0 - 200 mg/dL   Triglycerides 97 <150 mg/dL   HDL 38 (L) >40 mg/dL   Total CHOL/HDL Ratio 4.3 RATIO   VLDL 19 0 - 40 mg/dL   LDL Cholesterol 108 (H) 0 - 99 mg/dL    Comment:        Total Cholesterol/HDL:CHD Risk Coronary Heart Disease Risk Table                     Men   Women  1/2 Average Risk   3.4   3.3  Average Risk       5.0   4.4  2 X Average Risk   9.6   7.1  3 X Average Risk  23.4   11.0        Use the calculated Patient Ratio above and the CHD Risk Table to determine the patient's CHD Risk.        ATP III CLASSIFICATION (LDL):  <100     mg/dL   Optimal  100-129  mg/dL   Near or Above                    Optimal  130-159  mg/dL   Borderline  160-189  mg/dL   High  >190     mg/dL   Very High Performed at [facility], [address] [place], [place] [address]   ECHOCARDIOGRAM COMPLETE     Status: None   Collection Time: 02/29/20  1:26 PM  Result Value Ref Range   BP 136/85 mmHg   Ao pk vel 1.21 m/s   AV Area VTI 3.20 cm2   AR max vel 2.92 cm2   AV Mean grad 3.0 mmHg   AV Peak grad 5.9 mmHg   S' Lateral 3.11 cm   AV Area mean vel 2.79 cm2    Area-P 1/2 3.21 cm2    PHQ2/9: Depression screen [facility] 2/9 03/08/2020 01/25/2020 12/20/2019 11/09/2019 09/01/2019  Decreased Interest 0 0 1 0 0  Down, Depressed, Hopeless 0 0 1 0 0  PHQ - 2 Score 0 0 2 0 0  Altered sleeping 1 0 0 - 0  Tired, decreased energy 1 1 0 - 0  Change in appetite 0 1 0 - 0  Feeling bad or failure about yourself  0 0 0 - 0  Trouble concentrating 0 0 0 - 0  Moving slowly or fidgety/restless 0 0 1 - 0  Suicidal thoughts 0 0 0 - 0  PHQ-9 Score 2 2 3  - 0  Difficult doing work/chores - - Not difficult at all - -  Some recent data might be hidden    phq 9 is negative   Fall Risk: Fall Risk  03/08/2020 01/25/2020 12/20/2019 11/09/2019 09/01/2019  Falls in the past year? 0 1 1 1  0  Number falls in past yr: 0 0 1 1 0  Injury with Fall? 0 0 0 0 0  Comment - - - - -  Risk for fall due to : - - History of fall(s) - -  Risk for fall due to: Comment - - - - -  Follow up - - Falls prevention discussed - -     Functional Status Survey: Is the patient deaf or have difficulty hearing?: Yes Does the patient have difficulty seeing,  even when wearing glasses/contacts?: No Does the patient have difficulty concentrating, remembering, or making decisions?: No Does the patient have difficulty walking or climbing stairs?: Yes Does the patient have difficulty dressing or bathing?: No Does the patient have difficulty doing errands alone such as visiting a doctor's office or shopping?: No    Assessment & Plan  1. History of CVA in adulthood  Doing well now   2. Hospital discharge follow-up  Medication reconciliation done, no symptoms, keep follow ups

## 2020-03-08 ENCOUNTER — Other Ambulatory Visit: Payer: Self-pay

## 2020-03-08 ENCOUNTER — Encounter: Payer: Self-pay | Admitting: Family Medicine

## 2020-03-08 ENCOUNTER — Ambulatory Visit (INDEPENDENT_AMBULATORY_CARE_PROVIDER_SITE_OTHER): Payer: Medicare Other | Admitting: Family Medicine

## 2020-03-08 VITALS — BP 122/80 | HR 82 | Temp 97.9°F | Resp 16 | Ht 66.0 in | Wt 213.2 lb

## 2020-03-08 DIAGNOSIS — Z09 Encounter for follow-up examination after completed treatment for conditions other than malignant neoplasm: Secondary | ICD-10-CM

## 2020-03-08 DIAGNOSIS — Z8673 Personal history of transient ischemic attack (TIA), and cerebral infarction without residual deficits: Secondary | ICD-10-CM

## 2020-03-08 NOTE — Patient Instructions (Addendum)
TEE  and loop recorder for cardiologist  Ask Dr. Manuella Ghazi if she can stop Plavix as recommended by neurologist during hospital stay

## 2020-03-12 ENCOUNTER — Other Ambulatory Visit: Payer: Self-pay | Admitting: Family Medicine

## 2020-03-12 DIAGNOSIS — R0602 Shortness of breath: Secondary | ICD-10-CM | POA: Diagnosis not present

## 2020-03-12 DIAGNOSIS — I10 Essential (primary) hypertension: Secondary | ICD-10-CM | POA: Diagnosis not present

## 2020-03-12 DIAGNOSIS — R079 Chest pain, unspecified: Secondary | ICD-10-CM | POA: Diagnosis not present

## 2020-03-12 DIAGNOSIS — R609 Edema, unspecified: Secondary | ICD-10-CM | POA: Diagnosis not present

## 2020-03-12 DIAGNOSIS — E559 Vitamin D deficiency, unspecified: Secondary | ICD-10-CM

## 2020-03-12 DIAGNOSIS — R002 Palpitations: Secondary | ICD-10-CM | POA: Diagnosis not present

## 2020-03-12 DIAGNOSIS — E669 Obesity, unspecified: Secondary | ICD-10-CM | POA: Diagnosis not present

## 2020-03-12 DIAGNOSIS — I208 Other forms of angina pectoris: Secondary | ICD-10-CM | POA: Diagnosis not present

## 2020-03-12 DIAGNOSIS — Z87898 Personal history of other specified conditions: Secondary | ICD-10-CM | POA: Diagnosis not present

## 2020-03-12 DIAGNOSIS — E785 Hyperlipidemia, unspecified: Secondary | ICD-10-CM | POA: Diagnosis not present

## 2020-03-12 DIAGNOSIS — I480 Paroxysmal atrial fibrillation: Secondary | ICD-10-CM | POA: Diagnosis not present

## 2020-03-19 DIAGNOSIS — I208 Other forms of angina pectoris: Secondary | ICD-10-CM | POA: Diagnosis not present

## 2020-03-19 DIAGNOSIS — R002 Palpitations: Secondary | ICD-10-CM | POA: Diagnosis not present

## 2020-03-19 DIAGNOSIS — R0602 Shortness of breath: Secondary | ICD-10-CM | POA: Diagnosis not present

## 2020-04-01 ENCOUNTER — Encounter: Payer: Self-pay | Admitting: Family Medicine

## 2020-04-02 ENCOUNTER — Other Ambulatory Visit: Payer: Self-pay

## 2020-04-02 DIAGNOSIS — Z8669 Personal history of other diseases of the nervous system and sense organs: Secondary | ICD-10-CM | POA: Diagnosis not present

## 2020-04-02 DIAGNOSIS — Z8673 Personal history of transient ischemic attack (TIA), and cerebral infarction without residual deficits: Secondary | ICD-10-CM | POA: Diagnosis not present

## 2020-04-02 DIAGNOSIS — E559 Vitamin D deficiency, unspecified: Secondary | ICD-10-CM

## 2020-04-02 MED ORDER — VITAMIN D (ERGOCALCIFEROL) 1.25 MG (50000 UNIT) PO CAPS: ORAL_CAPSULE | ORAL | 1 refills | Status: DC

## 2020-04-04 DIAGNOSIS — F330 Major depressive disorder, recurrent, mild: Secondary | ICD-10-CM | POA: Diagnosis not present

## 2020-04-11 ENCOUNTER — Encounter: Payer: Self-pay | Admitting: Family Medicine

## 2020-04-11 ENCOUNTER — Other Ambulatory Visit: Payer: Self-pay | Admitting: Family Medicine

## 2020-04-11 MED ORDER — ATORVASTATIN CALCIUM 40 MG PO TABS: 40.0000 mg | ORAL_TABLET | Freq: Every day | ORAL | 1 refills | Status: DC

## 2020-04-19 ENCOUNTER — Other Ambulatory Visit: Payer: Self-pay

## 2020-04-19 ENCOUNTER — Emergency Department: Payer: Medicare Other

## 2020-04-19 ENCOUNTER — Inpatient Hospital Stay (HOSPITAL_COMMUNITY)
Admission: AD | Admit: 2020-04-19 | Discharge: 2020-04-24 | DRG: 042 | Disposition: A | Discharge status: Home / Self Care | Payer: Medicare Other | Source: Other Acute Inpatient Hospital | Attending: Internal Medicine | Admitting: Internal Medicine

## 2020-04-19 ENCOUNTER — Observation Stay (HOSPITAL_COMMUNITY): Payer: Medicare Other

## 2020-04-19 ENCOUNTER — Inpatient Hospital Stay
Admission: EM | Admit: 2020-04-19 | Discharge: 2020-04-19 | DRG: 066 | Disposition: A | Discharge status: Other Acute Inpatient Hospital | Payer: Medicare Other | Attending: Family Medicine | Admitting: Family Medicine

## 2020-04-19 DIAGNOSIS — Z7982 Long term (current) use of aspirin: Secondary | ICD-10-CM

## 2020-04-19 DIAGNOSIS — I63512 Cerebral infarction due to unspecified occlusion or stenosis of left middle cerebral artery: Secondary | ICD-10-CM | POA: Diagnosis not present

## 2020-04-19 DIAGNOSIS — I63312 Cerebral infarction due to thrombosis of left middle cerebral artery: Secondary | ICD-10-CM | POA: Diagnosis not present

## 2020-04-19 DIAGNOSIS — E785 Hyperlipidemia, unspecified: Secondary | ICD-10-CM | POA: Diagnosis present

## 2020-04-19 DIAGNOSIS — I6350 Cerebral infarction due to unspecified occlusion or stenosis of unspecified cerebral artery: Secondary | ICD-10-CM

## 2020-04-19 DIAGNOSIS — R29700 NIHSS score 0: Secondary | ICD-10-CM | POA: Diagnosis not present

## 2020-04-19 DIAGNOSIS — E039 Hypothyroidism, unspecified: Secondary | ICD-10-CM | POA: Diagnosis present

## 2020-04-19 DIAGNOSIS — Z8042 Family history of malignant neoplasm of prostate: Secondary | ICD-10-CM

## 2020-04-19 DIAGNOSIS — I63412 Cerebral infarction due to embolism of left middle cerebral artery: Secondary | ICD-10-CM | POA: Diagnosis not present

## 2020-04-19 DIAGNOSIS — Z79899 Other long term (current) drug therapy: Secondary | ICD-10-CM

## 2020-04-19 DIAGNOSIS — Z87891 Personal history of nicotine dependence: Secondary | ICD-10-CM | POA: Diagnosis not present

## 2020-04-19 DIAGNOSIS — E669 Obesity, unspecified: Secondary | ICD-10-CM | POA: Diagnosis not present

## 2020-04-19 DIAGNOSIS — F329 Major depressive disorder, single episode, unspecified: Secondary | ICD-10-CM | POA: Diagnosis present

## 2020-04-19 DIAGNOSIS — Z20822 Contact with and (suspected) exposure to covid-19: Secondary | ICD-10-CM | POA: Diagnosis present

## 2020-04-19 DIAGNOSIS — M810 Age-related osteoporosis without current pathological fracture: Secondary | ICD-10-CM | POA: Diagnosis present

## 2020-04-19 DIAGNOSIS — H9190 Unspecified hearing loss, unspecified ear: Secondary | ICD-10-CM | POA: Diagnosis present

## 2020-04-19 DIAGNOSIS — Z820 Family history of epilepsy and other diseases of the nervous system: Secondary | ICD-10-CM | POA: Diagnosis not present

## 2020-04-19 DIAGNOSIS — I639 Cerebral infarction, unspecified: Secondary | ICD-10-CM | POA: Diagnosis not present

## 2020-04-19 DIAGNOSIS — F419 Anxiety disorder, unspecified: Secondary | ICD-10-CM | POA: Diagnosis not present

## 2020-04-19 DIAGNOSIS — Z713 Dietary counseling and surveillance: Secondary | ICD-10-CM

## 2020-04-19 DIAGNOSIS — Z8673 Personal history of transient ischemic attack (TIA), and cerebral infarction without residual deficits: Secondary | ICD-10-CM | POA: Diagnosis not present

## 2020-04-19 DIAGNOSIS — I669 Occlusion and stenosis of unspecified cerebral artery: Secondary | ICD-10-CM

## 2020-04-19 DIAGNOSIS — Z882 Allergy status to sulfonamides status: Secondary | ICD-10-CM

## 2020-04-19 DIAGNOSIS — I119 Hypertensive heart disease without heart failure: Secondary | ICD-10-CM | POA: Diagnosis present

## 2020-04-19 DIAGNOSIS — Z79890 Hormone replacement therapy (postmenopausal): Secondary | ICD-10-CM

## 2020-04-19 DIAGNOSIS — R471 Dysarthria and anarthria: Secondary | ICD-10-CM | POA: Diagnosis present

## 2020-04-19 DIAGNOSIS — Z6833 Body mass index (BMI) 33.0-33.9, adult: Secondary | ICD-10-CM | POA: Diagnosis not present

## 2020-04-19 DIAGNOSIS — D696 Thrombocytopenia, unspecified: Secondary | ICD-10-CM

## 2020-04-19 DIAGNOSIS — N838 Other noninflammatory disorders of ovary, fallopian tube and broad ligament: Secondary | ICD-10-CM | POA: Diagnosis not present

## 2020-04-19 DIAGNOSIS — Z7901 Long term (current) use of anticoagulants: Secondary | ICD-10-CM | POA: Diagnosis not present

## 2020-04-19 DIAGNOSIS — Z8249 Family history of ischemic heart disease and other diseases of the circulatory system: Secondary | ICD-10-CM | POA: Diagnosis not present

## 2020-04-19 DIAGNOSIS — I6782 Cerebral ischemia: Secondary | ICD-10-CM | POA: Diagnosis not present

## 2020-04-19 DIAGNOSIS — R197 Diarrhea, unspecified: Secondary | ICD-10-CM | POA: Diagnosis not present

## 2020-04-19 DIAGNOSIS — R29702 NIHSS score 2: Secondary | ICD-10-CM | POA: Diagnosis present

## 2020-04-19 DIAGNOSIS — Z833 Family history of diabetes mellitus: Secondary | ICD-10-CM | POA: Diagnosis not present

## 2020-04-19 DIAGNOSIS — N839 Noninflammatory disorder of ovary, fallopian tube and broad ligament, unspecified: Secondary | ICD-10-CM | POA: Diagnosis present

## 2020-04-19 DIAGNOSIS — I6300 Cerebral infarction due to thrombosis of unspecified precerebral artery: Secondary | ICD-10-CM | POA: Diagnosis not present

## 2020-04-19 DIAGNOSIS — F32A Depression, unspecified: Secondary | ICD-10-CM | POA: Diagnosis present

## 2020-04-19 DIAGNOSIS — I63411 Cerebral infarction due to embolism of right middle cerebral artery: Secondary | ICD-10-CM | POA: Diagnosis not present

## 2020-04-19 DIAGNOSIS — I63419 Cerebral infarction due to embolism of unspecified middle cerebral artery: Secondary | ICD-10-CM

## 2020-04-19 DIAGNOSIS — R531 Weakness: Secondary | ICD-10-CM | POA: Diagnosis not present

## 2020-04-19 DIAGNOSIS — Z818 Family history of other mental and behavioral disorders: Secondary | ICD-10-CM | POA: Diagnosis not present

## 2020-04-19 DIAGNOSIS — Q283 Other malformations of cerebral vessels: Secondary | ICD-10-CM | POA: Diagnosis not present

## 2020-04-19 DIAGNOSIS — N83202 Unspecified ovarian cyst, left side: Secondary | ICD-10-CM | POA: Diagnosis not present

## 2020-04-19 DIAGNOSIS — I6389 Other cerebral infarction: Secondary | ICD-10-CM | POA: Diagnosis not present

## 2020-04-19 HISTORY — DX: Cerebral infarction due to unspecified occlusion or stenosis of left middle cerebral artery: I63.512

## 2020-04-19 LAB — DIFFERENTIAL
Abs Immature Granulocytes: 0.02 10*3/uL (ref 0.00–0.07)
Basophils Absolute: 0 10*3/uL (ref 0.0–0.1)
Basophils Relative: 0 %
Eosinophils Absolute: 0.2 10*3/uL (ref 0.0–0.5)
Eosinophils Relative: 3 %
Immature Granulocytes: 0 %
Lymphocytes Relative: 22 %
Lymphs Abs: 1.8 10*3/uL (ref 0.7–4.0)
Monocytes Absolute: 0.4 10*3/uL (ref 0.1–1.0)
Monocytes Relative: 5 %
Neutro Abs: 5.7 10*3/uL (ref 1.7–7.7)
Neutrophils Relative %: 70 %

## 2020-04-19 LAB — APTT: aPTT: 26 seconds (ref 24–36)

## 2020-04-19 LAB — COMPREHENSIVE METABOLIC PANEL
ALT: 15 U/L (ref 0–44)
AST: 22 U/L (ref 15–41)
Albumin: 3.7 g/dL (ref 3.5–5.0)
Alkaline Phosphatase: 156 U/L — ABNORMAL HIGH (ref 38–126)
Anion gap: 8 (ref 5–15)
BUN: 23 mg/dL (ref 8–23)
CO2: 25 mmol/L (ref 22–32)
Calcium: 8.9 mg/dL (ref 8.9–10.3)
Chloride: 106 mmol/L (ref 98–111)
Creatinine, Ser: 0.88 mg/dL (ref 0.44–1.00)
GFR, Estimated: >60 mL/min (ref >60)
Glucose, Bld: 104 mg/dL — ABNORMAL HIGH (ref 70–99)
Potassium: 3.7 mmol/L (ref 3.5–5.1)
Sodium: 139 mmol/L (ref 135–145)
Total Bilirubin: 0.7 mg/dL (ref 0.3–1.2)
Total Protein: 7 g/dL (ref 6.5–8.1)

## 2020-04-19 LAB — URINALYSIS, ROUTINE W REFLEX MICROSCOPIC
Bilirubin Urine: NEGATIVE
Glucose, UA: NEGATIVE mg/dL
Ketones, ur: NEGATIVE mg/dL
Leukocytes,Ua: NEGATIVE
Nitrite: NEGATIVE
Protein, ur: NEGATIVE mg/dL
Specific Gravity, Urine: 1.01 (ref 1.005–1.030)
pH: 6.5 (ref 5.0–8.0)

## 2020-04-19 LAB — URINE DRUG SCREEN, QUALITATIVE (ARMC ONLY)
Amphetamines, Ur Screen: NOT DETECTED
Barbiturates, Ur Screen: NOT DETECTED
Benzodiazepine, Ur Scrn: NOT DETECTED
Cannabinoid 50 Ng, Ur ~~LOC~~: NOT DETECTED
Cocaine Metabolite,Ur ~~LOC~~: NOT DETECTED
MDMA (Ecstasy)Ur Screen: NOT DETECTED
Methadone Scn, Ur: NOT DETECTED
Opiate, Ur Screen: NOT DETECTED
Phencyclidine (PCP) Ur S: NOT DETECTED
Tricyclic, Ur Screen: NOT DETECTED

## 2020-04-19 LAB — CBC
HCT: 39.8 % (ref 36.0–46.0)
HCT: 37 % (ref 36.0–46.0)
Hemoglobin: 13.3 g/dL (ref 12.0–15.0)
Hemoglobin: 12 g/dL (ref 12.0–15.0)
MCH: 31.5 pg (ref 26.0–34.0)
MCH: 30.9 pg (ref 26.0–34.0)
MCHC: 33.4 g/dL (ref 30.0–36.0)
MCHC: 32.4 g/dL (ref 30.0–36.0)
MCV: 94.3 fL (ref 80.0–100.0)
MCV: 95.4 fL (ref 80.0–100.0)
Platelets: 93 10*3/uL — ABNORMAL LOW (ref 150–400)
Platelets: 77 10*3/uL — ABNORMAL LOW (ref 150–400)
RBC: 4.22 MIL/uL (ref 3.87–5.11)
RBC: 3.88 MIL/uL (ref 3.87–5.11)
RDW: 12.1 % (ref 11.5–15.5)
RDW: 12.2 % (ref 11.5–15.5)
WBC: 8.1 10*3/uL (ref 4.0–10.5)
WBC: 7.9 10*3/uL (ref 4.0–10.5)
nRBC: 0 % (ref 0.0–0.2)
nRBC: 0 % (ref 0.0–0.2)

## 2020-04-19 LAB — PROTIME-INR
INR: 1.1 (ref 0.8–1.2)
Prothrombin Time: 13.3 seconds (ref 11.4–15.2)

## 2020-04-19 LAB — RESP PANEL BY RT-PCR (FLU A&B, COVID) ARPGX2
Influenza A by PCR: NEGATIVE
Influenza B by PCR: NEGATIVE
SARS Coronavirus 2 by RT PCR: NEGATIVE

## 2020-04-19 LAB — ETHANOL: Alcohol, Ethyl (B): <10 mg/dL (ref <10)

## 2020-04-19 LAB — URINALYSIS, MICROSCOPIC (REFLEX)

## 2020-04-19 LAB — CREATININE, SERUM
Creatinine, Ser: 0.75 mg/dL (ref 0.44–1.00)
GFR, Estimated: >60 mL/min (ref >60)

## 2020-04-19 IMAGING — MR MR HEAD W/O CM
6 of 11 series · 24 of 48 positions shown · non-contrast
Comparison: Prior CTs from earlier the same day as well as previous
MRI from [DATE].

CLINICAL DATA: Initial evaluation for acute stroke.

EXAM:
MRI HEAD WITHOUT CONTRAST
TECHNIQUE: Multiplanar, multiecho pulse sequences of the brain and surrounding
structures were obtained without intravenous contrast.

[Series 2: DWI · axial · 3.0mm · 0.94mm/px · z∈[-118,+39]mm · 7 of 108 slices shown (1 of 2)]
[im 1/108]
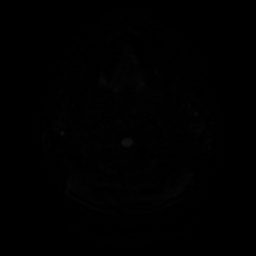
[im 18/108]
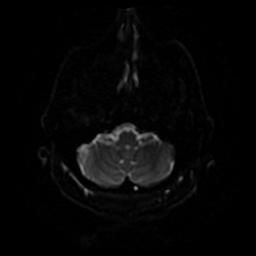
[im 36/108]
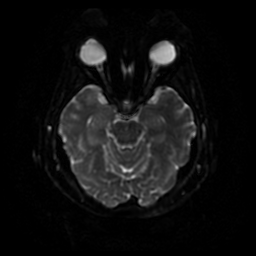
[im 54/108]
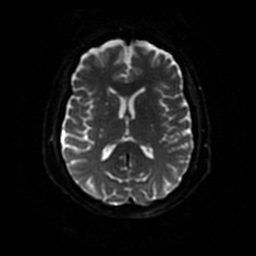
[im 72/108]
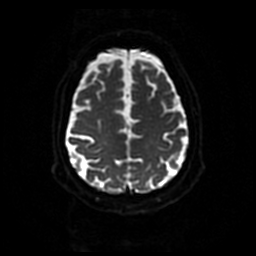
[im 90/108]
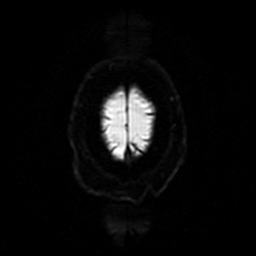
[im 108/108]
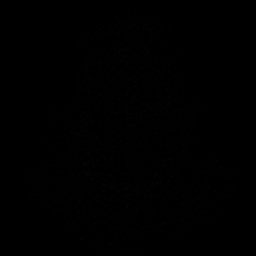

[Series 3: DWI · coronal · 4.0mm · 0.94mm/px · 6 of 74 slices shown (2 of 2)]
[im 1/74]
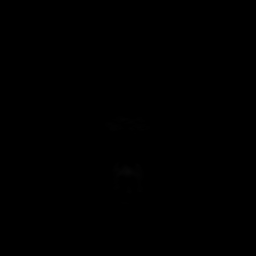
[im 15/74]
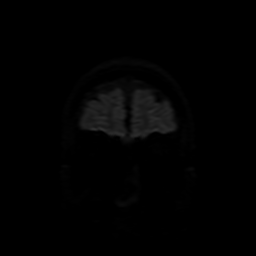
[im 30/74]
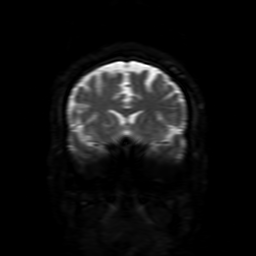
[im 44/74]
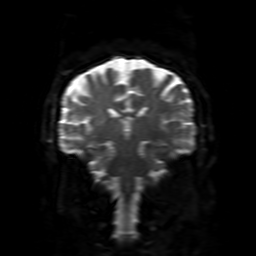
[im 59/74]
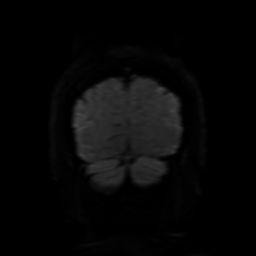
[im 74/74]
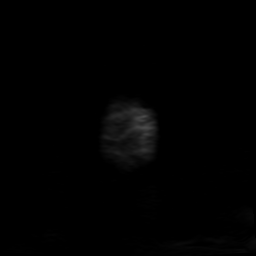

[Series 4: FLAIR · sagittal · 5.0mm · 0.23mm/px · 2 of 26 slices shown (1 of 2)]
[im 1/26]
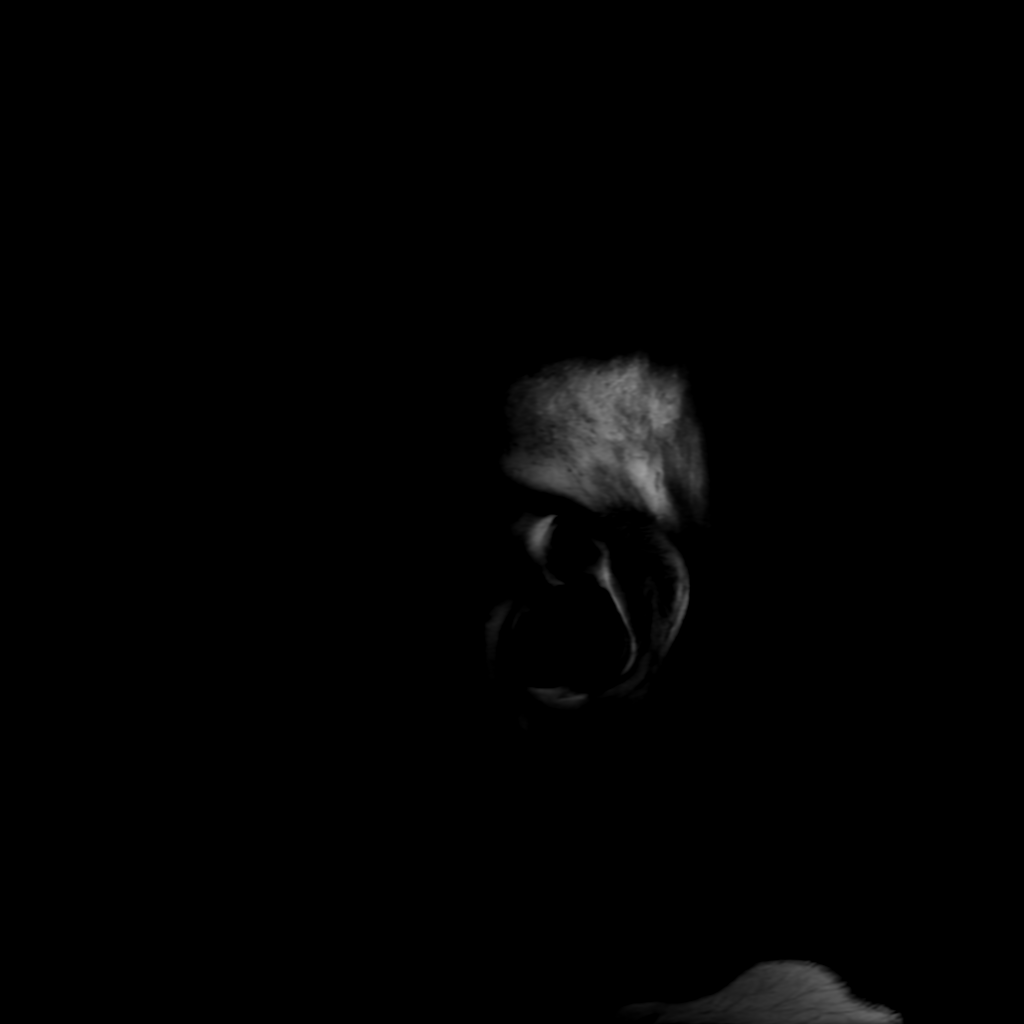
[im 26/26]
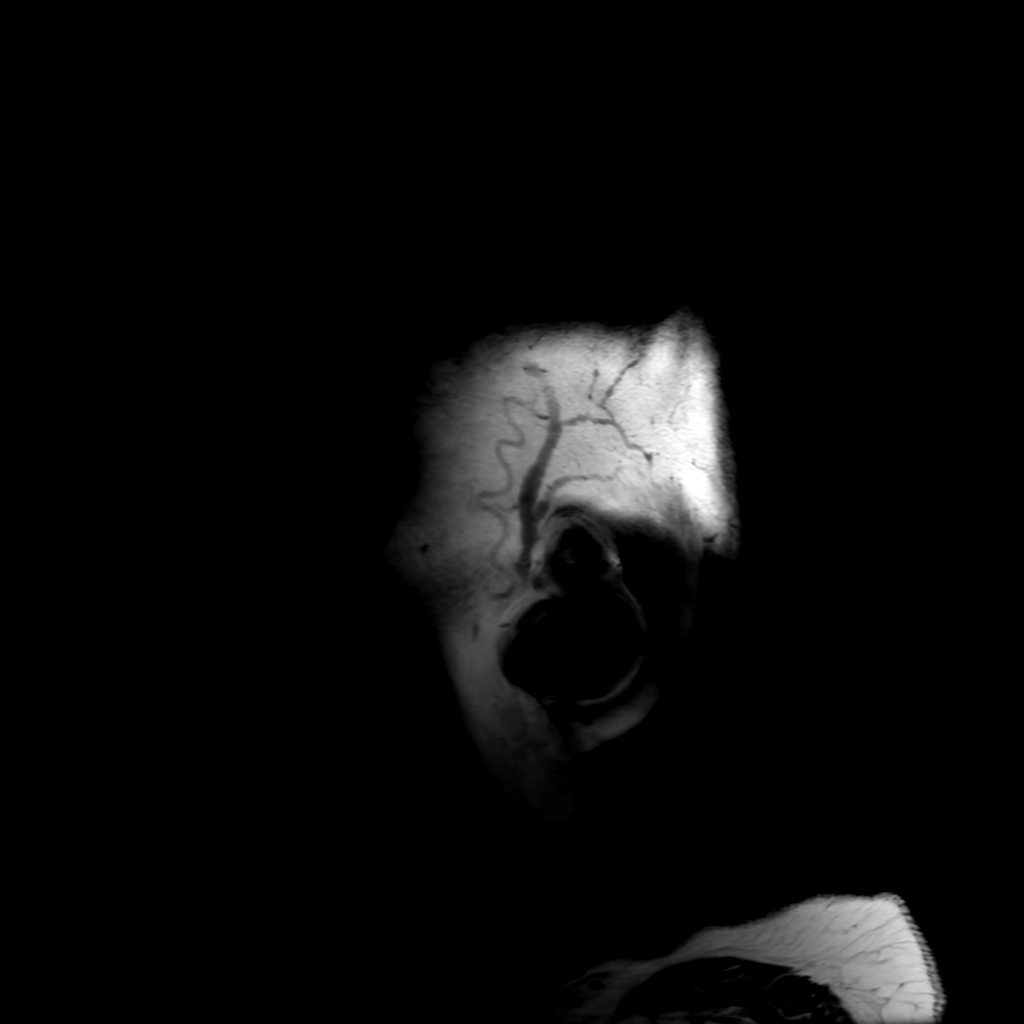

[Series 6: FLAIR · axial · 3.0mm · 0.45mm/px · z∈[-122,+30]mm · 2 of 27 slices shown (2 of 2)]
[im 1/27]
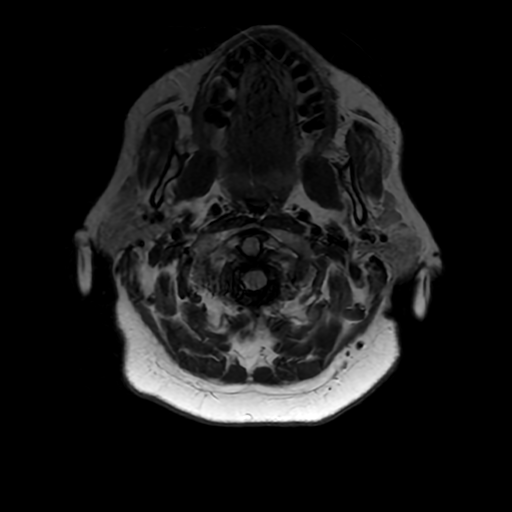
[im 27/27]
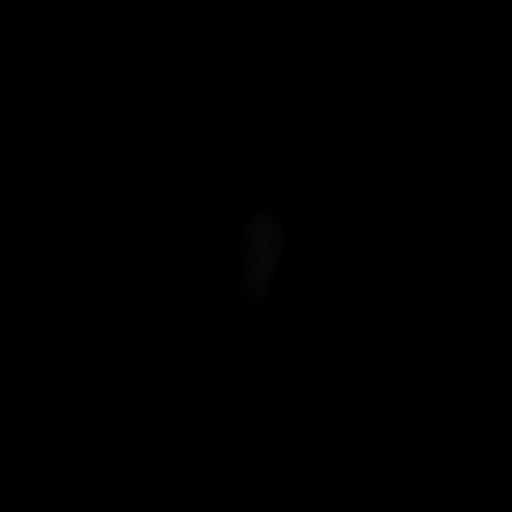

[Series 250: ADC · axial · 3.0mm · 0.94mm/px · z∈[-118,+39]mm · 4 of 54 slices shown (1 of 2)]
[im 1/54]
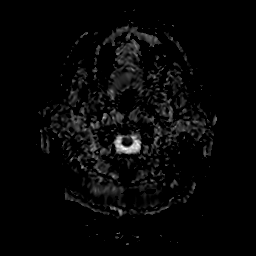
[im 18/54]
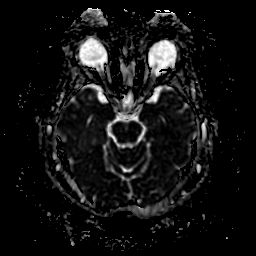
[im 36/54]
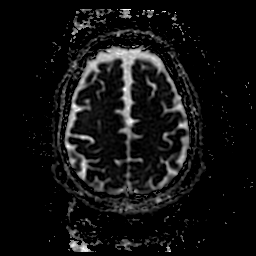
[im 54/54]
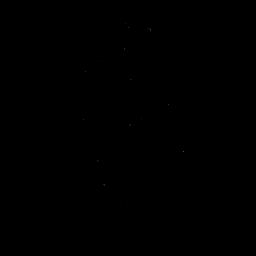

[Series 350: ADC · coronal · 4.0mm · 0.94mm/px · 3 of 37 slices shown (2 of 2)]
[im 1/37]
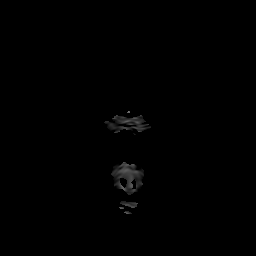
[im 19/37]
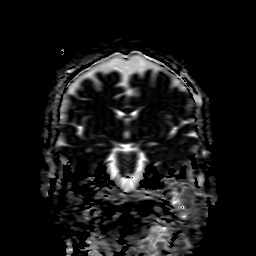
[im 37/37]
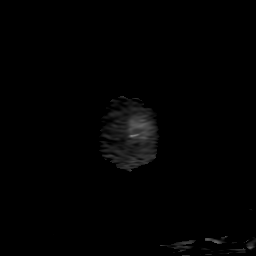

[24 of 48 positions shown; findings below may reference images not displayed]

FINDINGS: Brain: Generalized age appropriate cerebral volume. Mild chronic
microvascular ischemic disease again noted.

Patchy small volume foci of restricted diffusion seen involving the
cortical and subcortical aspect of the posterior left frontoparietal
and temporal region, consistent with posterior left MCA distribution
infarcts. For reference purposes, the largest of these foci seen at
the posterior left temporal region and measures 8 mm. Findings are
embolic in distribution. No associated hemorrhage or mass effect.

Additional apparent subcentimeter focus of mild diffusion
abnormality at the posterior right frontoparietal centrum semi ovale
favored to reflect T2 shine through (series 2, image 34). No other
evidence for acute or subacute ischemia. Gray-white matter
differentiation otherwise maintained. No acute intracranial
hemorrhage. Small focus of susceptibility artifact at the posterior
right temporal region noted, consistent with a small chronic
microhemorrhage. Additional minimal residual chronic blood products
noted about the occipital regions related to recently identified
ischemic infarcts.

No mass lesion, midline shift or mass effect. No hydrocephalus or
extra-axial fluid collection. Pituitary gland suprasellar region
within normal limits. Midline structures intact.

Vascular: Hypoplastic left vertebral artery not well seen. Major
intracranial vascular flow voids are otherwise maintained.

Skull and upper cervical spine: Craniocervical junction within
normal limits. Bone marrow signal intensity normal. No focal marrow
replacing lesion. No scalp soft tissue abnormality.

Sinuses/Orbits: Patient status post bilateral ocular lens
replacement. Globes and orbital soft tissues demonstrate no acute
finding. Paranasal sinuses are largely clear. No significant mastoid
effusion. Inner ear structures grossly normal.

Other: None.
IMPRESSION: 1. Patchy small volume acute ischemic nonhemorrhagic posterior left
MCA distribution infarcts, likely reflecting an embolic shower. No
associated hemorrhage or mass effect.
2. No other acute intracranial abnormality.
3. Underlying mild chronic microvascular ischemic disease.

## 2020-04-19 IMAGING — CT CT HEAD CODE STROKE
3 of 4 series · 14 of 47 positions shown, 16 images · non-contrast
Comparison: None.
COMPARISON: None.

Addendum:
CLINICAL DATA: Code stroke.

EXAM:
CT HEAD WITHOUT CONTRAST
TECHNIQUE: Contiguous axial images were obtained from the base of the skull
through the vertex without intravenous contrast.

[Series 5: coronal soft tissue · coronal · 0.30mm/px · 3 of 66 slices shown]
[im 22/66  brain]
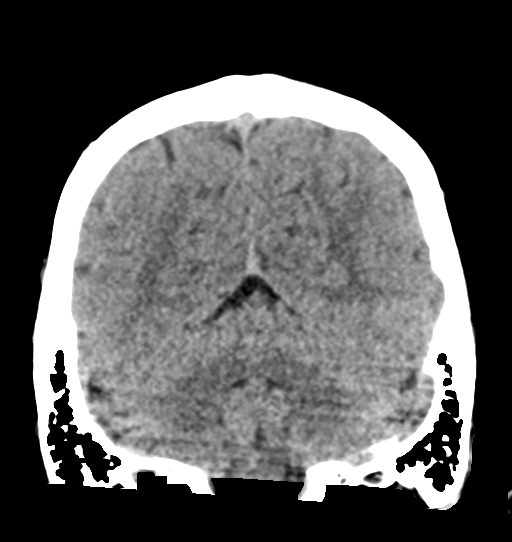
[im 29/66  brain]
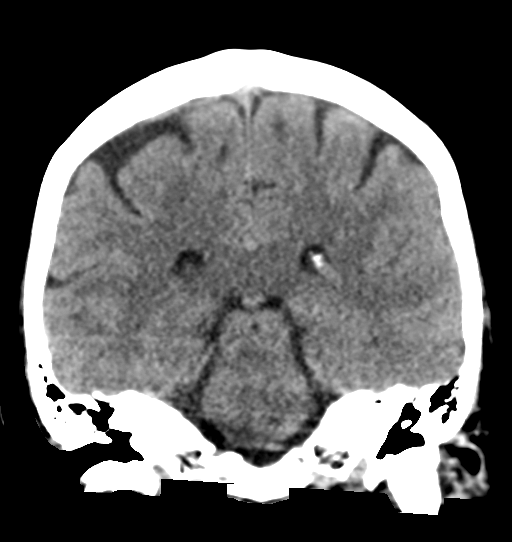
[im 37/66  brain]
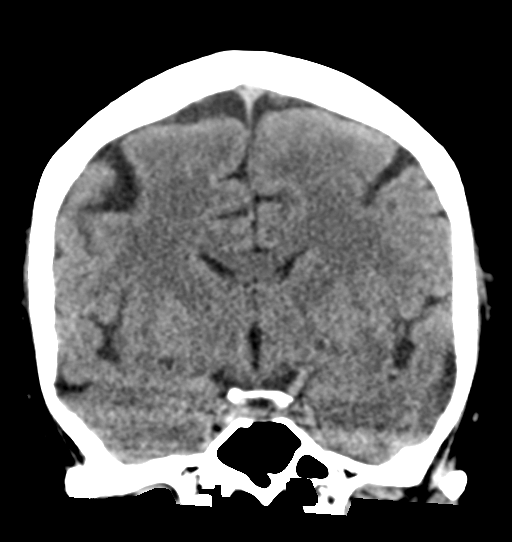

[Series 6: sagittal soft tissue · sagittal · 0.31mm/px · 3 of 47 slices shown]
[im 16/47  brain]
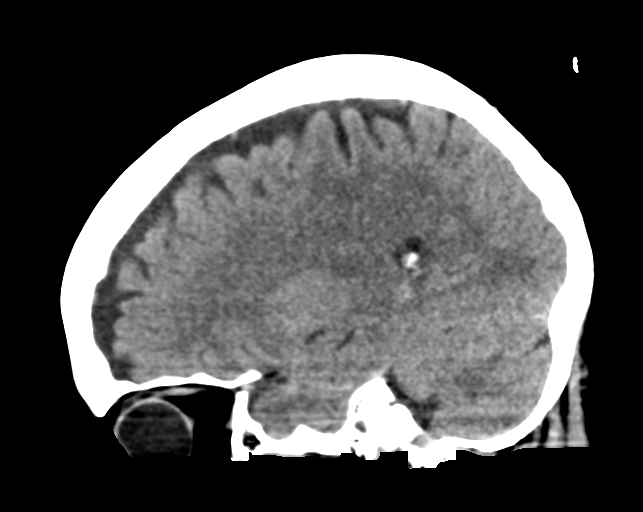
[im 24/47  brain]
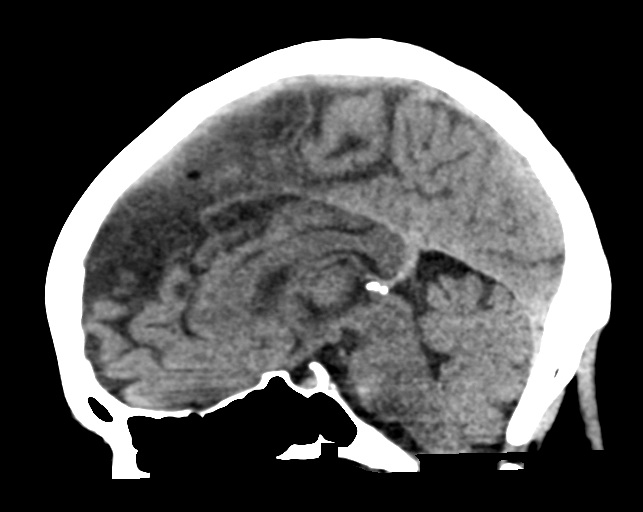
[im 31/47  brain]
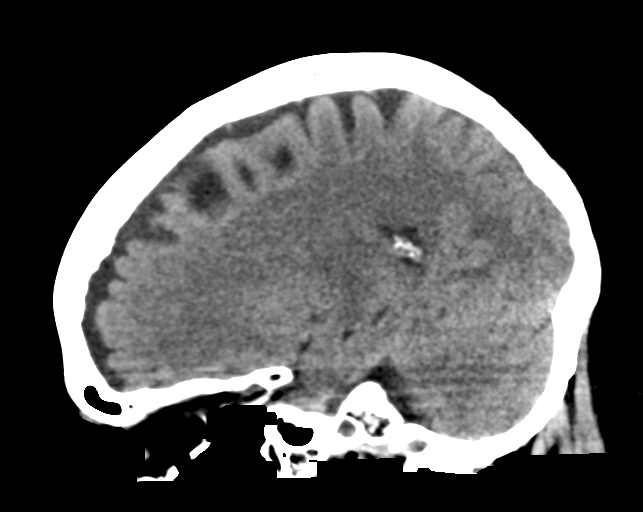

[Series 7: ax head wo · axial · 0.31mm/px · z∈[+393,+526]mm · 8 of 33 slices shown, 10 images]
[im 3/33  brain]
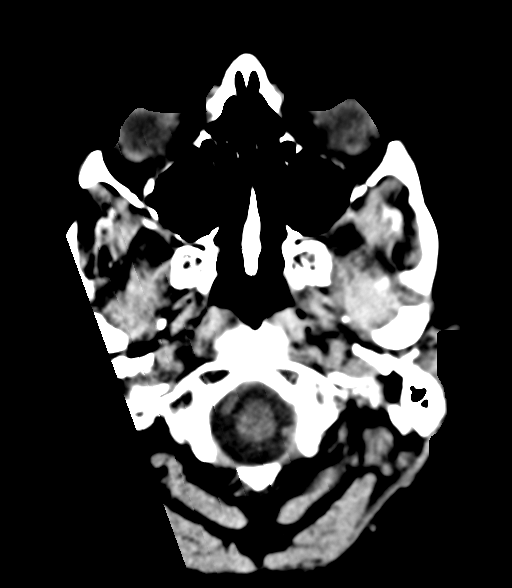
[im 3/33  bone]
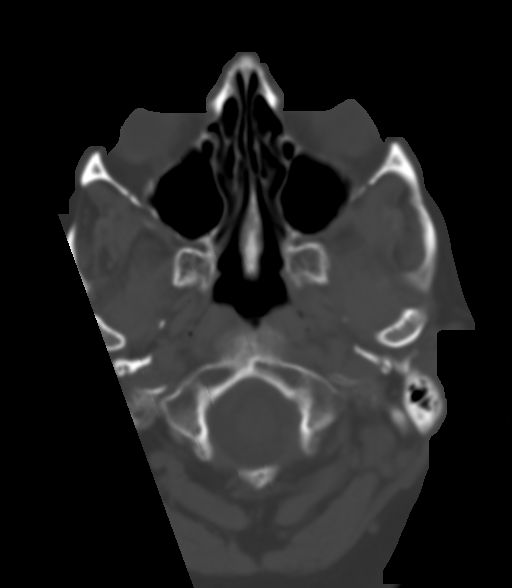
[im 7/33  brain]
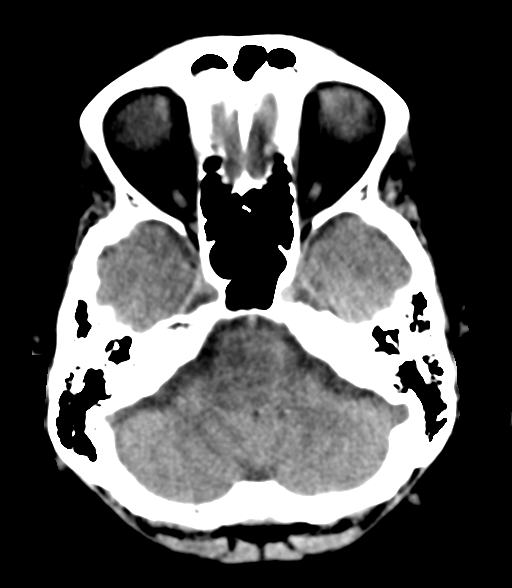
[im 11/33  brain]
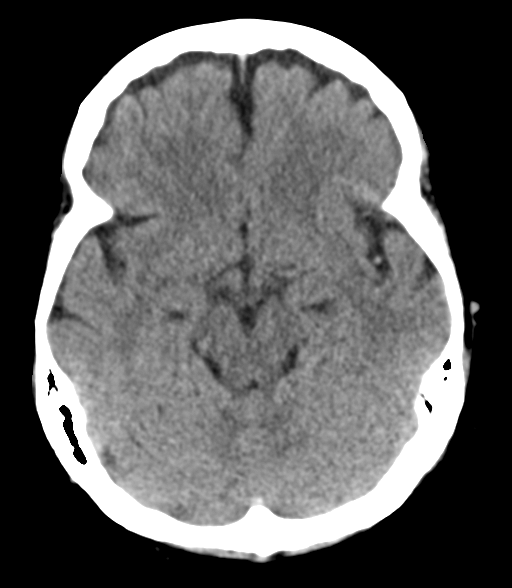
[im 15/33  brain]
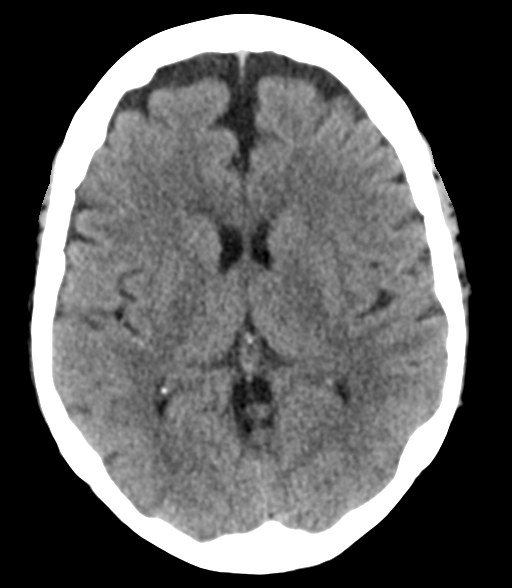
[im 18/33  brain]
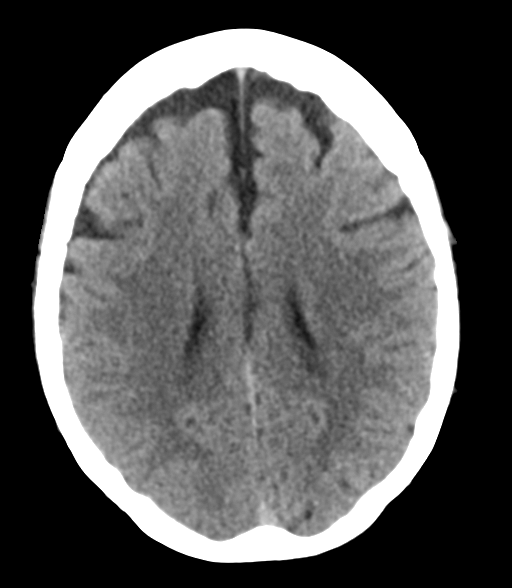
[im 18/33  bone]
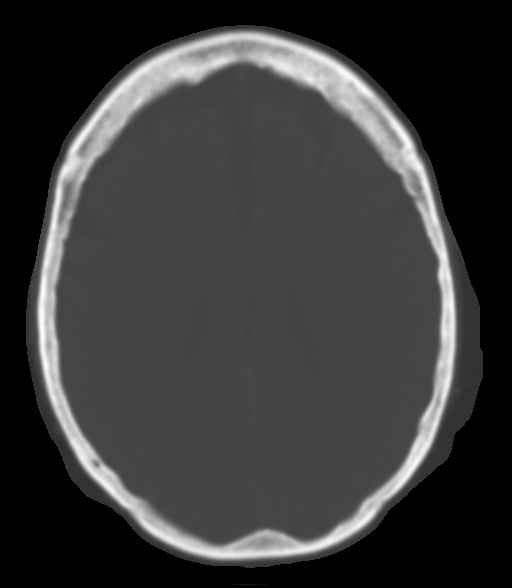
[im 22/33  brain]
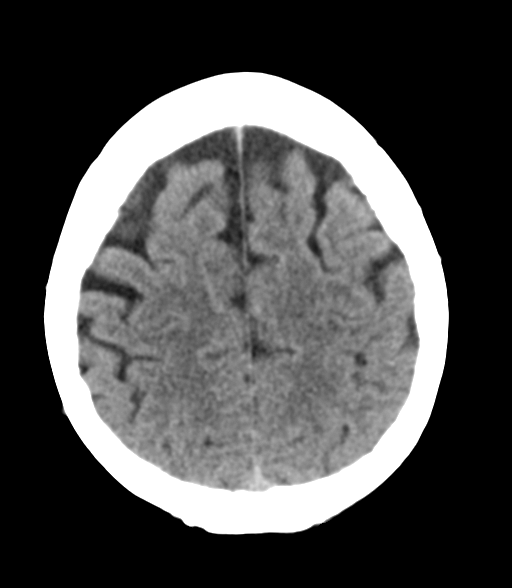
[im 26/33  brain]
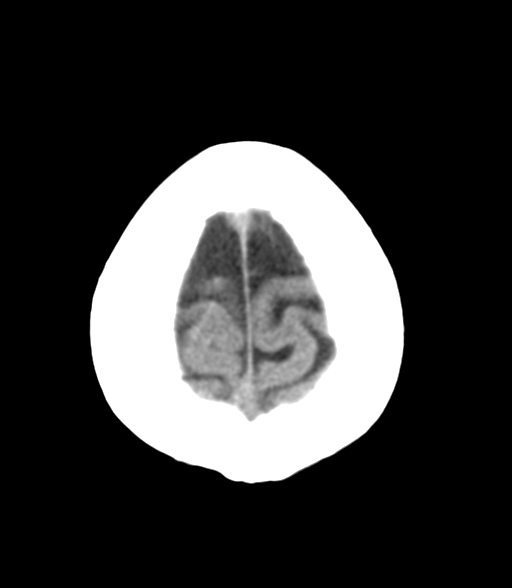
[im 30/33  brain]
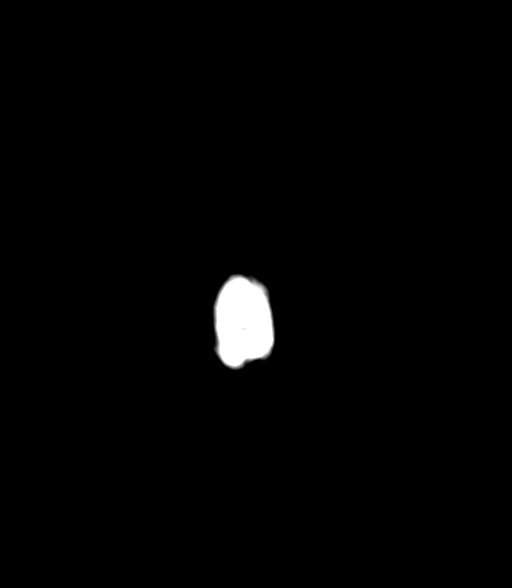

[14 of 47 positions shown; findings below may reference images not displayed]

FINDINGS: Brain: No acute intracranial hemorrhage, mass effect, or edema. No
definite new loss of gray-white differentiation. Ventricles and
sulci are normal in size and configuration. No extra-axial
collection.

Vascular: Focal density along a left M2 MCA branch within the
sylvian fissure.

Skull: Unremarkable.

Sinuses/Orbits: No acute finding.

Other: Mastoid air cells are clear.

ASPECTS (Alberta Stroke Program Early CT Score)

- Ganglionic level infarction (caudate, lentiform nuclei, internal
capsule, insula, M1-M3 cortex): 6

- Supraganglionic infarction (M4-M6 cortex): 3

Total score (0-10 with 10 being normal): 9
IMPRESSION: No acute intracranial hemorrhage.  No definite acute infarction.

Possible dense left M2 MCA branch within the sylvian fissure. CTA
pending.

These results were called by telephone at the time of interpretation
on [DATE] at [DATE] to provider TILLER , who verbally
acknowledged these results.

ADDENDUM:
Under ASPECT score calculation there is a dictation error. Should
state 7 rather than 6.

*** End of Addendum ***
FINDINGS: Brain: No acute intracranial hemorrhage, mass effect, or edema. No
definite new loss of gray-white differentiation. Ventricles and
sulci are normal in size and configuration. No extra-axial
collection.

Vascular: Focal density along a left M2 MCA branch within the
sylvian fissure.

Skull: Unremarkable.

Sinuses/Orbits: No acute finding.

Other: Mastoid air cells are clear.

ASPECTS (Alberta Stroke Program Early CT Score)

- Ganglionic level infarction (caudate, lentiform nuclei, internal
capsule, insula, M1-M3 cortex): 6

- Supraganglionic infarction (M4-M6 cortex): 3

Total score (0-10 with 10 being normal): 9
IMPRESSION: No acute intracranial hemorrhage.  No definite acute infarction.

Possible dense left M2 MCA branch within the sylvian fissure. CTA
pending.

These results were called by telephone at the time of interpretation
on [DATE] at [DATE] to provider TILLER , who verbally
acknowledged these results.

## 2020-04-19 MED ORDER — ACETAMINOPHEN 160 MG/5ML PO SOLN: 650.0000 mg | ORAL | Status: DC | PRN

## 2020-04-19 MED ORDER — TOPIRAMATE 25 MG PO TABS
25.0000 mg | ORAL_TABLET | Freq: Every day | ORAL | Status: DC
Start: 2020-04-20 — End: 2020-04-24
  Administered 2020-04-20 – 2020-04-24 (×5): 25 mg via ORAL
  Filled 2020-04-19 (×5): qty 1

## 2020-04-19 MED ORDER — ACETAMINOPHEN 650 MG RE SUPP: 650.0000 mg | RECTAL | Status: DC | PRN

## 2020-04-19 MED ORDER — CLOPIDOGREL BISULFATE 75 MG PO TABS
75.0000 mg | ORAL_TABLET | Freq: Every day | ORAL | Status: DC
  Administered 2020-04-20 – 2020-04-23 (×4): 75 mg via ORAL
  Filled 2020-04-19 (×4): qty 1

## 2020-04-19 MED ORDER — ENOXAPARIN SODIUM 40 MG/0.4ML ~~LOC~~ SOLN
40.0000 mg | SUBCUTANEOUS | Status: DC
  Administered 2020-04-19 – 2020-04-23 (×5): 40 mg via SUBCUTANEOUS
  Filled 2020-04-19 (×5): qty 0.4

## 2020-04-19 MED ORDER — IOHEXOL 350 MG/ML SOLN
75.0000 mL | Freq: Once | INTRAVENOUS | Status: AC | PRN
  Administered 2020-04-19: 75 mL via INTRAVENOUS

## 2020-04-19 MED ORDER — ATORVASTATIN CALCIUM 40 MG PO TABS
40.0000 mg | ORAL_TABLET | Freq: Every day | ORAL | Status: DC
  Administered 2020-04-20 – 2020-04-24 (×5): 40 mg via ORAL
  Filled 2020-04-19 (×5): qty 1

## 2020-04-19 MED ORDER — CLOPIDOGREL BISULFATE 75 MG PO TABS
300.0000 mg | ORAL_TABLET | Freq: Once | ORAL | Status: AC
  Administered 2020-04-19: 300 mg via ORAL
  Filled 2020-04-19: qty 4

## 2020-04-19 MED ORDER — SODIUM CHLORIDE 0.9 % IV SOLN: INTRAVENOUS | Status: DC

## 2020-04-19 MED ORDER — ACETAMINOPHEN 325 MG PO TABS: 650.0000 mg | ORAL_TABLET | ORAL | Status: DC | PRN

## 2020-04-19 MED ORDER — ASPIRIN EC 325 MG PO TBEC
325.0000 mg | DELAYED_RELEASE_TABLET | Freq: Once | ORAL | Status: AC
  Administered 2020-04-19: 325 mg via ORAL
  Filled 2020-04-19: qty 1

## 2020-04-19 MED ORDER — ASPIRIN 81 MG PO CHEW
81.0000 mg | CHEWABLE_TABLET | Freq: Every day | ORAL | Status: DC
  Administered 2020-04-20 – 2020-04-24 (×5): 81 mg via ORAL
  Filled 2020-04-19 (×5): qty 1

## 2020-04-19 MED ORDER — SODIUM CHLORIDE 0.9 % IV SOLN
1000.0000 mL | Freq: Once | INTRAVENOUS | Status: AC
  Administered 2020-04-19: 1000 mL via INTRAVENOUS

## 2020-04-19 MED ORDER — STROKE: EARLY STAGES OF RECOVERY BOOK
Freq: Once | Status: AC
  Filled 2020-04-19: qty 1

## 2020-04-19 MED ORDER — LEVOTHYROXINE SODIUM 75 MCG PO TABS
75.0000 ug | ORAL_TABLET | Freq: Every day | ORAL | Status: DC
  Administered 2020-04-20 – 2020-04-24 (×5): 75 ug via ORAL
  Filled 2020-04-19 (×5): qty 1

## 2020-04-19 MED ORDER — CITALOPRAM HYDROBROMIDE 40 MG PO TABS
40.0000 mg | ORAL_TABLET | Freq: Every day | ORAL | Status: DC
  Administered 2020-04-20: 40 mg via ORAL
  Filled 2020-04-19: qty 1

## 2020-04-19 MED ORDER — BUPROPION HCL ER (SR) 150 MG PO TB12
150.0000 mg | ORAL_TABLET | Freq: Two times a day (BID) | ORAL | Status: DC
Start: 2020-04-19 — End: 2020-04-24
  Administered 2020-04-19 – 2020-04-24 (×10): 150 mg via ORAL
  Filled 2020-04-19 (×11): qty 1

## 2020-04-19 NOTE — ED Triage Notes (Signed)
Pt at home and reports difficulty speaking, double vision and "I just didn't know what was going on." Hx of stroke in January. No chest pain, SOB or HA

## 2020-04-19 NOTE — Consult Note (Signed)
NEUROLOGY CONSULTATION NOTE   Date of service: April 19, 2020 Patient Name: Natalie [name] MRN:  [id number] DOB:  05-13-1950 Reason for consult: "L MCA stroke" _ _ _   _ __   _ __ _ _  __ __   _ __   __ _  History of Present Illness  Natalie [name] is a 70 y.o. female with recent stroke in January 2022 who was seen at [facility] onset difficulty speaking and transient double vision.  She woke up in AM and was fine. Symptoms started at 0930 while she was in the kitchen. It was mostly problem with finding word or how to do something.  Workup with CTH, No acute intracranial hemorrhage.  No definite acute infarction. CTA with nearly occlusive thrombus at the bifurcation of a proximal M2 MCA branch approximately 6 mm from the MCA bifurcation. Subsequent occlusion of a proximal left M3 MCA branch. No stenosis in the neck.  Symptoms resolved and she was transferred for close monitoring to [facility].  tPA was not offered due to recent stroke. Thrombectomy: No, not a complete occlusion MRS: 0(able to do pretty much everything by herself per daughter) NIHSS: 0  NIHSS components Score: Comment  1a Level of Conscious 0[x]  1[]  2[]  3[]      1b LOC Questions 0[x]  1[]  2[]       1c LOC Commands 0[x]  1[]  2[]       2 Best Gaze 0[x]  1[]  2[]       3 Visual 0[x]  1[]  2[]  3[]      4 Facial Palsy 0[x]  1[]  2[]  3[]      5a Motor Arm - left 0[x]  1[]  2[]  3[]  4[]  UN[]    5b Motor Arm - Right 0[x]  1[]  2[]  3[]  4[]  UN[]    6a Motor Leg - Left 0[x]  1[]  2[]  3[]  4[]  UN[]    6b Motor Leg - Right 0[x]  1[]  2[]  3[]  4[]  UN[]    7 Limb Ataxia 0[x]  1[]  2[]  3[]  UN[]     8 Sensory 0[x]  1[]  2[]  UN[]      9 Best Language 0[x]  1[]  2[]  3[]      10 Dysarthria 0[x]  1[]  2[]  UN[]      11 Extinct. and Inattention 0[x]  1[]  2[]       TOTAL: 0      ROS   Constitutional Denies weight loss, fever and chills.  HEENT Denies changes in vision and hearing.   Respiratory Denies SOB and cough.   CV Denies  palpitations and CP   GI Denies abdominal pain, nausea, vomiting and diarrhea.   GU Denies dysuria and urinary frequency.   MSK Denies myalgia and joint pain.   Skin Denies rash and pruritus.   Neurological Denies headache and syncope.   Psychiatric Denies recent changes in mood. Denies anxiety and depression.    Past History   Past Medical History:  Diagnosis Date  . Acute bronchospasm due to viral infection   . Anemia    history of  . CVA (cerebral vascular accident) ([place]) 02/28/2020  . Depressive disorder   . Epistaxis   . Fatigue   . Hearing loss   . Hypothyroidism   . Memory change   . Migraine   . Numbness and tingling   . Obesity (BMI 30.0-34.9)   . Osteoarthritis   . Osteoporosis   . Polyuria   . Premature menopause   . Seizures ([place])    history of mini seizures, possible migraine induced   Past Surgical History:  Procedure Laterality Date  .  ABLATION  2006  . CATARACT EXTRACTION W/ INTRAOCULAR LENS IMPLANT Bilateral   . COLONOSCOPY    . COLONOSCOPY WITH PROPOFOL N/A 12/22/2018   Procedure: COLONOSCOPY WITH PROPOFOL;  Surgeon: Virgel Manifold, MD;  Location: [facility] ENDOSCOPY;  Service: Endoscopy;  Laterality: N/A;  . EYE SURGERY  Spring 2018   Cataracts both eyes  . TONSILLECTOMY     Family History  Problem Relation Age of Onset  . Early death Father        suicide  . Depression Father   . Diabetes Father   . Cancer Father        prostate  . Hearing loss Father        due to war  . Alzheimer's disease Mother   . Heart disease Brother   . Heart disease Maternal Aunt   . Dementia Maternal Aunt   . Heart disease Maternal Uncle   . Dementia Maternal Grandmother   . Heart disease Brother    Social History   Socioeconomic History  . Marital status: Single    Spouse name: Not on file  . Number of children: 1  . Years of education: Not on file  . Highest education level: Bachelor's degree (e.g., BA, AB, BS)  Occupational History  . Occupation:  retired   Tobacco Use  . Smoking status: Former Smoker    Packs/day: 0.50    Years: 6.00    Pack years: 3.00    Types: Cigarettes    Quit date: 02/11/1974    Years since quitting: 46.2  . Smokeless tobacco: Never Used  Vaping Use  . Vaping Use: Never used  Substance and Sexual Activity  . Alcohol use: Yes    Alcohol/week: 0.0 standard drinks    Comment: 2-3 drinks monthly  . Drug use: Never  . Sexual activity: Not Currently    Comment: Don't use not sexually active  Other Topics Concern  . Not on file  Social History Narrative   Raised an adopted child on her own   Working part time reviewed documents   Social Determinants of Health   Financial Resource Strain: Low Risk   . Difficulty of Paying Living Expenses: Not hard at all  Food Insecurity: No Food Insecurity  . Worried About Charity fundraiser in the Last Year: Never true  . Ran Out of Food in the Last Year: Never true  Transportation Needs: No Transportation Needs  . Lack of Transportation (Medical): No  . Lack of Transportation (Non-Medical): No  Physical Activity: Inactive  . Days of Exercise per Week: 0 days  . Minutes of Exercise per Session: 0 min  Stress: No Stress Concern Present  . Feeling of Stress : Not at all  Social Connections: Moderately Integrated  . Frequency of Communication with Friends and Family: More than three times a week  . Frequency of Social Gatherings with Friends and Family: Twice a week  . Attends Religious Services: More than 4 times per year  . Active Member of Clubs or Organizations: Yes  . Attends Archivist Meetings: More than 4 times per year  . Marital Status: Never married   Allergies  Allergen Reactions  . Sulfamethoxazole-Trimethoprim Hives    Medications   Medications Prior to Admission  Medication Sig Dispense Refill Last Dose  . aspirin EC 81 MG tablet Take 81 mg by mouth See admin instructions. Take 1 tablet 4 times a week.     Marland Kitchen atorvastatin (LIPITOR)  40 MG tablet  Take 1 tablet (40 mg total) by mouth daily. 90 tablet 1   . buPROPion (WELLBUTRIN SR) 150 MG 12 hr tablet Take 150 mg by mouth 2 (two) times daily.     . citalopram (CELEXA) 40 MG tablet Take 40 mg by mouth daily.     . clindamycin-benzoyl peroxide (BENZACLIN) gel Apply topically 2 (two) times daily. 50 g 0   . Cyanocobalamin (VITAMIN B-12) 1000 MCG SUBL Place 1,000 mcg under the tongue daily.     Marland Kitchen levothyroxine (SYNTHROID) 75 MCG tablet Take 1 tablet (75 mcg total) by mouth daily before breakfast. 90 tablet 1   . Multiple Vitamins-Minerals (MULTIVITAMIN WOMEN 50+ PO) Take 1 tablet by mouth daily.     . Omega-3 Fatty Acids (FISH OIL) 1000 MG CAPS Take 1 capsule by mouth See admin instructions. Take 1 capsule 4 times a week.     . SUMAtriptan (IMITREX) 100 MG tablet May repeat in 2 hours if headache persists or recurs. 10 tablet 0   . topiramate (TOPAMAX) 25 MG tablet Take 25 mg by mouth daily.      . Vitamin D, Ergocalciferol, (DRISDOL) 1.25 MG (50000 UNIT) CAPS capsule TAKE 1 CAPSULE BY MOUTH EVERY 7 DAYS 12 capsule 1      Vitals   Vitals:   04/19/20 1755 04/19/20 2000  BP: (!) 125/105 (!) 155/81  Pulse: 62 62  Resp: 15 18  Temp: 98 F (36.7 C) 98.1 F (36.7 C)  TempSrc: Oral Oral  SpO2: 99% 97%     There is no height or weight on file to calculate BMI.  Physical Exam   General: Laying comfortably in bed; in no acute distress.  HENT: Normal oropharynx and mucosa. Normal external appearance of ears and nose.  Neck: Supple, no pain or tenderness  CV: No JVD. No peripheral edema.  Pulmonary: Symmetric Chest rise. Normal respiratory effort.  Abdomen: Soft to touch, non-tender.  Ext: No cyanosis, edema, or deformity  Skin: No rash. Normal palpation of skin.   Musculoskeletal: Normal digits and nails by inspection. No clubbing.   Neurologic Examination  Mental status/Cognition: Alert, oriented to self, place, month and year, good attention.  Speech/language:  Fluent, comprehension intact, object naming intact, repetition intact. Cranial nerves:   CN II Pupils equal and reactive to light, no VF deficits    CN III,IV,VI EOM intact, no gaze preference or deviation, no nystagmus    CN V normal sensation in V1, V2, and V3 segments bilaterally    CN VII no asymmetry, no nasolabial fold flattening    CN VIII normal hearing to speech    CN IX & X normal palatal elevation, no uvular deviation    CN XI 5/5 head turn and 5/5 shoulder shrug bilaterally    CN XII midline tongue protrusion    Motor:  Muscle bulk: normal, tone normal, pronator drift none tremor none Mvmt Root Nerve  Muscle Right Left Comments  SA C5/6 Ax Deltoid 5 5   EF C5/6 Mc Biceps 5 5   EE C6/7/8 Rad Triceps 5 5   WF C6/7 Med FCR 5 5   WE C7/8 PIN ECU 5 5   F Ab C8/T1 U ADM/FDI 5 5   HF L1/2/3 Fem Illopsoas 5 5   KE L2/3/4 Fem Quad 5 5   DF L4/5 D Peron Tib Ant 5 5   PF S1/2 Tibial Grc/Sol 5 5    Reflexes:  Right Left Comments  Pectoralis  Biceps (C5/6) 2 2   Brachioradialis (C5/6) 2 2    Triceps (C6/7) 2 2    Patellar (L3/4) 2 2    Achilles (S1) 1 1    Hoffman      Plantar     Jaw jerk    Sensation:  Light touch Intact throughout   Pin prick    Temperature    Vibration   Proprioception    Coordination/Complex Motor:  - Finger to Nose intact BL - Heel to shin intact BL - Rapid alternating movement intact BL - Gait: Deferred.  Labs   CBC:  Recent Labs  Lab 04/19/20 1322 04/19/20 2025  WBC 8.1 7.9  NEUTROABS 5.7  --   HGB 13.3 12.0  HCT 39.8 37.0  MCV 94.3 95.4  PLT 93* 77*    Basic Metabolic Panel:  Lab Results  Component Value Date   NA 139 04/19/2020   K 3.7 04/19/2020   CO2 25 04/19/2020   GLUCOSE 104 (H) 04/19/2020   BUN 23 04/19/2020   CREATININE 0.75 04/19/2020   CALCIUM 8.9 04/19/2020   GFRNONAA >60 04/19/2020   GFRAA 72 01/25/2020   Lipid Panel:  Lab Results  Component Value Date   LDLCALC 108 (H) 02/29/2020   HgbA1c:   Lab Results  Component Value Date   HGBA1C 5.0 02/29/2020   Urine Drug Screen:     Component Value Date/Time   LABOPIA NONE DETECTED 04/19/2020 1322   COCAINSCRNUR NONE DETECTED 04/19/2020 1322   LABBENZ NONE DETECTED 04/19/2020 1322   AMPHETMU NONE DETECTED 04/19/2020 1322   THCU NONE DETECTED 04/19/2020 1322   LABBARB NONE DETECTED 04/19/2020 1322    Alcohol Level     Component Value Date/Time   ETH <10 04/19/2020 1322    CT Head without contrast: CTH was negative for a large hypodensity concerning for a large territory infarct or hyperdensity concerning for an ICH  CT angio Head and Neck with contrast: Nearly occlusive thrombus at the bifurcation of a proximal M2 MCA branch approximately 6 mm from the MCA bifurcation. Subsequent occlusion of a proximal left M3 MCA branch.  No stenosis in the neck.  MRI Brain  Pending   Impression   Natalie [name] is a 70 y.o. female with PMH significant for recent stroke in January 2022 who was seen at [facility] onset difficulty speaking and transient double vision with spontaneous resolution of symptoms. CTH negative for a large stroke. CTA with nearly occlusive thrombus at he bifurcation of proximal M2 MCA and subsequent occlusion of proximal left M3 MCA branch.  Given resolution of symptoms, no tPA or thrombectomy. She was transferred for close monitoring.  Recommendations   L MCA stroke: Recommend that primary team order following: - Frequent Neuro checks per stroke unit protocol - Brain imaging with MRI Brain without contras is pending. - TTE is pending. - LDL is pending. - Please start statin if LDL > 70 - Recommend HbA1c - Antithrombotic - Aspirin 324 and plavix 300mg  loaded before transfer. - Head of bed flat, strict bed rest. - I ordered IV Fluid sat 42ml/hr x 2 days - Recommend DVT ppx - SBP goal - permissive hypertension first 24 h < 220/110. Hold home meds.  - Recommend  Telemetry monitoring for arrythmia - Recommend bedside swallow screen prior to PO intake. - Stroke education booklet - I cancelled PT/OT consults for now given she is bed rest. Can put these in when she is taken off the  bedrest. - SLP consulted.  ______________________________________________________________________   Thank you for the opportunity to take part in the care of this patient. If you have any further questions, please contact the neurology consultation attending.  Signed,  [place] Pager Number [phone] _ _ _   _ __   _ __ _ _  __ __   _ __   __ _

## 2020-04-19 NOTE — Consult Note (Addendum)
Neurology Consult H&P  Natalie [name] MR# [id number] 04/19/2020  CC: difficulty speaking, double vision   History is obtained from: Patient, daughter and chart.  HPI: Natalie [name] is a 70 y.o. female PMHx as reviewed below with recent stroke January 2022 had sudden onset difficulty speaking and transient double vision.   She describes the event as walking into kitchen with a cup in her hand and suddenly dropped it. She stared at cup while thinking "...did not know what to do" which is out of character for her. On arrival to ED she had difficulty speaking however symptoms have since completely resolved.  She reports her symptoms have gradually improved although she still has some difficulty speaking.  She denies extremity weakness.  She reports that she has had a stroke in the last few months.  Review of medical record demonstrates the patient was discharged on 27 January for an acute CVA, the deficit at that time was episodes of double vision  She has never had this constellation of symptoms in the past.   On exam, the patient had bulbar dysarthria which resolved by the time of CT scanning.  LKW: 0930 tpa given: No contraindicated due to recent stroke. IR Thrombectomy No, not a complete occlusion. Modified Rankin Scale: 0-Completely asymptomatic and back to baseline post- stroke NIHSS: 2-->0  No chest pain, SOB or HA  ROS: A complete ROS was performed and is negative except as noted in the HPI.   Past Medical History:  Diagnosis Date  . Acute bronchospasm due to viral infection   . Anemia    history of  . CVA (cerebral vascular accident) ([place]) 02/28/2020  . Depressive disorder   . Epistaxis   . Fatigue   . Hearing loss   . Hypothyroidism   . Memory change   . Migraine   . Numbness and tingling   . Obesity (BMI 30.0-34.9)   . Osteoarthritis   . Osteoporosis   . Polyuria   . Premature menopause   . Seizures ([facility])    history of mini seizures, possible migraine  induced   Family History  Problem Relation Age of Onset  . Early death Father        suicide  . Depression Father   . Diabetes Father   . Cancer Father        prostate  . Hearing loss Father        due to war  . Alzheimer's disease Mother   . Heart disease Brother   . Heart disease Maternal Aunt   . Dementia Maternal Aunt   . Heart disease Maternal Uncle   . Dementia Maternal Grandmother   . Heart disease Brother    Social History:  reports that she quit smoking about 46 years ago. Her smoking use included cigarettes. She has a 3.00 pack-year smoking history. She has never used smokeless tobacco. She reports current alcohol use. She reports that she does not use drugs.  Prior to Admission medications   Medication Sig Start Date End Date Taking? Authorizing Provider  aspirin EC 81 MG tablet Take 81 mg by mouth See admin instructions. Take 1 tablet 4 times a week.    [provider]  atorvastatin (LIPITOR) 40 MG tablet Take 1 tablet (40 mg total) by mouth daily. 04/11/20   [name], MD  buPROPion (WELLBUTRIN SR) 150 MG 12 hr tablet Take 150 mg by mouth 2 (two) times daily.    Lew Dawes, MD  citalopram Sheliah Hatch)  40 MG tablet Take 40 mg by mouth daily.    Lew Dawes, MD  clindamycin-benzoyl peroxide [facility]) gel Apply topically 2 (two) times daily. 05/10/19   [name], MD  Cyanocobalamin (VITAMIN B-12) 1000 MCG SUBL Place 1,000 mcg under the tongue daily.    [provider]  levothyroxine (SYNTHROID) 75 MCG tablet Take 1 tablet (75 mcg total) by mouth daily before breakfast. 11/09/19   [name], MD  Multiple Vitamins-Minerals (MULTIVITAMIN WOMEN 50+ PO) Take 1 tablet by mouth daily.    [provider]  Omega-3 Fatty Acids (FISH OIL) 1000 MG CAPS Take 1 capsule by mouth See admin instructions. Take 1 capsule 4 times a week.    [provider]  SUMAtriptan (IMITREX) 100 MG tablet May repeat in 2 hours if headache persists or  recurs. 11/09/19   [name], MD  topiramate (TOPAMAX) 25 MG tablet Take 25 mg by mouth daily.     Lew Dawes, MD  Vitamin D, Ergocalciferol, (DRISDOL) 1.25 MG (50000 UNIT) CAPS capsule TAKE 1 CAPSULE BY MOUTH EVERY 7 DAYS 04/02/20   [name], MD   Exam: Current vital signs: BP (!) 146/88   Pulse 64   Temp 98.7 F (37.1 C)   Resp 19   Ht 5\' 6"  (1.676 m)   Wt 95.3 kg   SpO2 99%   BMI 33.89 kg/m   Physical Exam  Constitutional: Appears well-developed and well-nourished.  Psych: Very anxious. Eyes: No scleral injection HENT: No OP obstruction. Head: Normocephalic.  Cardiovascular: Normal rate and regular rhythm.  Respiratory: Effort normal, symmetric excursions bilaterally, no audible wheezing. GI: Soft.  No distension. There is no tenderness.  Skin: WDI  Neuro: Mental Status: Patient is awake, alert, oriented x3. Patient is able to give a clear and coherent history. Speech fluent, intact comprehension and repetition. No signs of aphasia or neglect. Visual Fields are full. Pupils are equal, round, and reactive to light. EOMI without ptosis or diploplia.  Facial sensation is symmetric to temperature Facial movement is symmetric.  Hearing is intact to voice. Uvula midline and palate elevates symmetrically. Shoulder shrug is symmetric. Tongue is midline without atrophy or fasciculations.  Tone is normal. Bulk is normal. 5/5 strength was present in all four extremities. Sensation is symmetric to light touch and temperature in the arms and legs. Deep Tendon Reflexes: 2+ and symmetric in the biceps and patellae. Toes are downgoing bilaterally. FNF and HKS are intact bilaterally. Gait - Deferred  I have reviewed labs in epic and the pertinent results are:   Ref. Range 04/19/2020 13:22  Glucose Latest Ref Range: 70 - 99 mg/dL 104 (H)   I have reviewed the images obtained: NCT head showed no definite acute ischemic changes however there is hyperdense M2  branch seen in sylv[place] f   CTA head and neck showed tandem left nearly occlusive thrombus in proximal M2 branch ~55mm from bifurcation and proximal left M3 branch occlusion.   Assessment: Natalie [name] is a 70 y.o. female PMHx stroke January 2022 with transient double vision and dysarthria completely resolved however CTA showed left M2 branch near occlusion in tandem with occluded proximal M3 Branch. Discussed transfer to [facility] for closer monitoring and she initially requested transfer to OSH in [place] where her daughter lives however, after further discussion the patient has dog at home and they will need to be looked after and she agreed to [facility].    Impression:  Acute ischemic stroke Acute left M2 branch  near occlusion in tandem with occluded proximal M3 Branch. Early subacute stroke.   Plan: Head of bed flat. SAT 1L 0.9% Na bolus. STAT aspirin 324mg . STAT clopidogrel 300mg  loading dose. Transfer to [facility] for close observation. In light of her recent stroke without significant vascular risk factors she will need an ILR.  Discussed with stroke neurology at [facility] and the patient is appropriate for admission to floor bed.   This patient is critically ill and at significant risk of neurological worsening, death and care requires constant monitoring of vital signs, hemodynamics,respiratory and cardiac monitoring, neurological assessment, discussion with family, other specialists and medical decision making of high complexity. I spent 73 minutes of neurocritical care time  in the care of  this patient. This was time spent independent of any time provided by nurse practitioner or PA.  Electronically signed by:  Lynnae Sandhoff, MD Page: [phone] 04/19/2020, 3:07 PM

## 2020-04-19 NOTE — ED Notes (Signed)
Neurologist and stroke coordinator at bedside

## 2020-04-19 NOTE — Code Documentation (Signed)
PT arrives with complaints of confusion and slurred speech that began at 0930, Code stroke activated, pt taken to CT and non con head CT and CTA preformed, initial NIHSS 2 for sensory left side and dysarthia, no tPA due to recent stroke in Jan 2022, report off to [facility] for Q2 neuro checks and vital signs

## 2020-04-19 NOTE — ED Provider Notes (Addendum)
[facility] Emergency Department Provider Note   ____________________________________________    I have reviewed the triage vital signs and the nursing notes.   HISTORY  Chief Complaint Altered Mental Status     HPI Natalie [name] is a 70 y.o. female with history of CVA presents with an episode of confusion.  Patient reports sometime between 930 and 10 AM this morning she was walking into her kitchen holding a cup when she abruptly dropped it, she is not sure why she dropped it.  She remembers staring at the cup and thinking that she "did not know what to do "which was very bizarre to her.  She reports her symptoms have gradually improved although she still has some difficulty speaking.  She denies extremity weakness.  She reports that she has had a stroke in the last few months.  Review of medical record demonstrates the patient was discharged on 27 January for an acute CVA, the deficit at that time was episodes of double vision  Past Medical History:  Diagnosis Date  . Acute bronchospasm due to viral infection   . Anemia    history of  . CVA (cerebral vascular accident) ([place]) 02/28/2020  . Depressive disorder   . Epistaxis   . Fatigue   . Hearing loss   . Hypothyroidism   . Memory change   . Migraine   . Numbness and tingling   . Obesity (BMI 30.0-34.9)   . Osteoarthritis   . Osteoporosis   . Polyuria   . Premature menopause   . Seizures ([place])    history of mini seizures, possible migraine induced    Patient Active Problem List   Diagnosis Date Noted  . History of ischemic multifocal posterior circulation stroke 02/29/2020  . Thrombocytopenia ([place]) 02/28/2020  . HPV test positive 07/08/2015  . Difficulty hearing 06/28/2015  . Arthritis, degenerative 06/28/2015  . Obesity, Class I, BMI 30-34.9 06/28/2015  . Paresthesia 06/28/2015  . Purpura, nonthrombopenic ([place]) 06/28/2015  . Skin lesion of right leg 06/28/2015  . Other specified  hypothyroidism 08/09/2014  . Migraine without aura and without status migrainosus, not intractable 09/19/2008  . Moderate major depression ([place]) 09/07/2006  . OP (osteoporosis) 09/07/2006    Past Surgical History:  Procedure Laterality Date  . ABLATION  2006  . CATARACT EXTRACTION W/ INTRAOCULAR LENS IMPLANT Bilateral   . COLONOSCOPY    . COLONOSCOPY WITH PROPOFOL N/A 12/22/2018   Procedure: COLONOSCOPY WITH PROPOFOL;  Surgeon: Virgel Manifold, MD;  Location: [facility] ENDOSCOPY;  Service: Endoscopy;  Laterality: N/A;  . EYE SURGERY  Spring 2018   Cataracts both eyes  . TONSILLECTOMY      Prior to Admission medications   Medication Sig Start Date End Date Taking? Authorizing Provider  aspirin EC 81 MG tablet Take 81 mg by mouth See admin instructions. Take 1 tablet 4 times a week.    [provider]  atorvastatin (LIPITOR) 40 MG tablet Take 1 tablet (40 mg total) by mouth daily. 04/11/20   [name], MD  buPROPion (WELLBUTRIN SR) 150 MG 12 hr tablet Take 150 mg by mouth 2 (two) times daily.    Lew Dawes, MD  citalopram (CELEXA) 40 MG tablet Take 40 mg by mouth daily.    Lew Dawes, MD  clindamycin-benzoyl peroxide [facility]) gel Apply topically 2 (two) times daily. 05/10/19   [name], MD  Cyanocobalamin (VITAMIN B-12) 1000 MCG SUBL Place 1,000 mcg under the tongue daily.  [provider]  levothyroxine (SYNTHROID) 75 MCG tablet Take 1 tablet (75 mcg total) by mouth daily before breakfast. 11/09/19   [name], MD  Multiple Vitamins-Minerals (MULTIVITAMIN WOMEN 50+ PO) Take 1 tablet by mouth daily.    [provider]  Omega-3 Fatty Acids (FISH OIL) 1000 MG CAPS Take 1 capsule by mouth See admin instructions. Take 1 capsule 4 times a week.    [provider]  SUMAtriptan (IMITREX) 100 MG tablet May repeat in 2 hours if headache persists or recurs. 11/09/19   [name], MD  topiramate (TOPAMAX) 25 MG tablet Take 25 mg by  mouth daily.     Lew Dawes, MD  Vitamin D, Ergocalciferol, (DRISDOL) 1.25 MG (50000 UNIT) CAPS capsule TAKE 1 CAPSULE BY MOUTH EVERY 7 DAYS 04/02/20   [name], MD     Allergies Sulfamethoxazole-trimethoprim  Family History  Problem Relation Age of Onset  . Early death Father        suicide  . Depression Father   . Diabetes Father   . Cancer Father        prostate  . Hearing loss Father        due to war  . Alzheimer's disease Mother   . Heart disease Brother   . Heart disease Maternal Aunt   . Dementia Maternal Aunt   . Heart disease Maternal Uncle   . Dementia Maternal Grandmother   . Heart disease Brother     Social History Social History   Tobacco Use  . Smoking status: Former Smoker    Packs/day: 0.50    Years: 6.00    Pack years: 3.00    Types: Cigarettes    Quit date: 02/11/1974    Years since quitting: 46.2  . Smokeless tobacco: Never Used  Vaping Use  . Vaping Use: Never used  Substance Use Topics  . Alcohol use: Yes    Alcohol/week: 0.0 standard drinks    Comment: 2-3 drinks monthly  . Drug use: Never    Review of Systems  Constitutional: No fever/chills Eyes: No visual changes.  ENT: No sore throat. Cardiovascular: Denies chest pain. Respiratory: Denies shortness of breath. Gastrointestinal: No abdominal pain.  No nausea, no vomiting.   Genitourinary: Negative for dysuria. Musculoskeletal: Negative for back pain. Skin: Negative for rash. Neurological: As above   ____________________________________________   PHYSICAL EXAM:  VITAL SIGNS: ED Triage Vitals [04/19/20 1318]  Enc Vitals Group     BP 135/71     Pulse Rate 72     Resp 17     Temp 98.7 F (37.1 C)     Temp src      SpO2 97 %     Weight      Height      Head Circumference      Peak Flow      Pain Score 0     Pain Loc      Pain Edu?      Excl. in [place]?     Constitutional: Alert and oriented. Eyes: Conjunctivae are normal.  PERRLA, EOMI Head:  Atraumatic. Nose: No congestion/rhinnorhea. Mouth/Throat: Mucous membranes are moist.    Cardiovascular: Normal rate, regular rhythm. Grossly normal heart sounds.  Good peripheral circulation. Respiratory: Normal respiratory effort.  No retractions. Lungs CTAB. Gastrointestinal: Soft and nontender. No distention.  No CVA tenderness. Musculoskeletal: No lower extremity tenderness nor edema.  Warm and well perfused Neurologic:  Speech is somewhat halting but appears mostly normal. CN  2-12 normal. Normal strength in the extremities. NIHSS 1 Skin:  Skin is warm, dry and intact. No rash noted. Psychiatric: Mood and affect are normal. Speech and behavior are normal.  ____________________________________________   LABS (all labs ordered are listed, but only abnormal results are displayed)  Labs Reviewed  CBC - Abnormal; Notable for the following components:      Result Value   Platelets 93 (*)    All other components within normal limits  COMPREHENSIVE METABOLIC PANEL - Abnormal; Notable for the following components:   Glucose, Bld 104 (*)    Alkaline Phosphatase 156 (*)    All other components within normal limits  RESP PANEL BY RT-PCR (FLU A&B, COVID) ARPGX2  ETHANOL  PROTIME-INR  APTT  DIFFERENTIAL  URINE DRUG SCREEN, QUALITATIVE ([facility] ONLY)  URINALYSIS, ROUTINE W REFLEX MICROSCOPIC   ____________________________________________  EKG  ED ECG REPORT I, Lavonia Drafts, the attending physician, personally viewed and interpreted this ECG.  Date: 04/19/2020  Rhythm: normal sinus rhythm QRS Axis: normal Intervals: LAFB ST/T Wave abnormalities: normal Narrative Interpretation: no evidence of acute ischemia  ____________________________________________  RADIOLOGY  CT head, contacted by radiologist notified of dense MCA sign CT angiography demonstrates M3 occlusion with nearly occlusive thrombus at the bifurcation of proximal  and ____________________________________________   PROCEDURES  Procedure(s) performed: yes  .1-3 Lead EKG Interpretation Performed by: Lavonia Drafts, MD Authorized by: Lavonia Drafts, MD     Interpretation: normal     ECG rate assessment: normal     Rhythm: sinus rhythm     Ectopy: none     Conduction: normal       Critical Care performed: yes  CRITICAL CARE Performed by: Lavonia Drafts   Total critical care time:30 minutes  Critical care time was exclusive of separately billable procedures and treating other patients.  Critical care was necessary to treat or prevent imminent or life-threatening deterioration.  Critical care was time spent personally by me on the following activities: development of treatment plan with patient and/or surrogate as well as nursing, discussions with consultants, evaluation of patient's response to treatment, examination of patient, obtaining history from patient or surrogate, ordering and performing treatments and interventions, ordering and review of laboratory studies, ordering and review of radiographic studies, pulse oximetry and re-evaluation of patient's condition.  ____________________________________________   INITIAL IMPRESSION / ASSESSMENT AND PLAN / ED COURSE  Pertinent labs & imaging results that were available during my care of the patient were reviewed by me and considered in my medical decision making (see chart for details).  Patient presents with confusion, difficulty speaking, possible extremity weakness that occurred between 930 and 10 AM, best estimates.  Her symptoms have significantly improved however she is within the window for acute stroke.  Have called code stroke  Patient seen by neurologist, who has added CT angiography  CT angiography demonstrates M3 occlusion, near occlusion bifurcation  Neurology recommends transfer to [facility] for observation in case intervention is necessary, does not qualify for TPA  given recent administration  Neurology recommends Plavix load, IV bolus, admission to hospitalist service at [facility].  ----------------------------------------- 2:57 PM on 04/19/2020 -----------------------------------------  Have contacted transfer center at [facility]    ____________________________________________   FINAL CLINICAL IMPRESSION(S) / ED DIAGNOSES  Final diagnoses:  Cerebrovascular accident (CVA) due to embolism of middle cerebral artery, unspecified blood vessel laterality ([place])        Note:  This document was prepared using Dragon voice recognition software and may include unintentional  dictation errors.   Lavonia Drafts, MD 04/19/20 1457    Lavonia Drafts, MD 04/19/20 [phone]

## 2020-04-19 NOTE — Progress Notes (Signed)
Chaplain paged for Cd Stroke. Patient taken to CT scan. Asked to be paged again if and when needed.

## 2020-04-19 NOTE — H&P (Addendum)
History and Physical:    Natalie [name]   ENI:[id number] DOB: [birth year] DOA: 04/19/2020  Referring MD/provider: [facility] ED PCP: [name], MD   Patient coming from: Home  Chief Complaint: Difficulty speaking and diplopia  History of Present Illness:   Natalie [name] is an 70 y.o. female with PMH significant for anxiety and depression and hypothyroidism who had a stroke 3 months ago in January 2022 when she presented with diplopia.  At that time the diplopia resolved and patient was placed on aspirin and atorvastatin.  Last week she had another episode of diplopia which lasted 1 to 2 minutes and resolve spontaneously.  Today at about 930 or 10 patient developed difficulty speaking and thinking.  Notes that she was having difficulty enunciating her words and was not quite sure what she was trying to do.  She felt somewhat confused.  To my history, patient states that her speech difficulty has been waxing and waning over the course of the afternoon.  She notes she feels better after she takes a nap and feels her speech is better however when she gets up her speech is worse again.  Her daughter notes that her speech is not back to baseline.  Patient also notes she is having some difficulty thinking and expressing herself although denies difficulty with word finding, it is more that she has difficulty knowing what she wants to say.  She feels her thinking is slowed.  Patient denies fevers or chills.  No new intercurrent illness.  No dysuria.  No nausea vomiting abdominal pain or malaise.  No cough or shortness of breath.   ED Course:  The patient was seen at [facility] ED where a code stroke was called and patient was seen by telemetry neurology.  CTA of head and neck showed nearly occlusive thrombus at the proximal M2 branch, 6 mm from bifurcation and proximal left M3 branch occlusion.  Patient was loaded with Plavix and aspirin.  Patient was discussed with neurology who recommended  transfer to [facility] for observation.  ROS:   ROS   Review of Systems: As per HPI   Past Medical History:   Past Medical History:  Diagnosis Date  . Acute bronchospasm due to viral infection   . Anemia    history of  . CVA (cerebral vascular accident) ([place]) 02/28/2020  . Depressive disorder   . Epistaxis   . Fatigue   . Hearing loss   . Hypothyroidism   . Memory change   . Migraine   . Numbness and tingling   . Obesity (BMI 30.0-34.9)   . Osteoarthritis   . Osteoporosis   . Polyuria   . Premature menopause   . Seizures ([place])    history of mini seizures, possible migraine induced    Past Surgical History:   Past Surgical History:  Procedure Laterality Date  . ABLATION  2006  . CATARACT EXTRACTION W/ INTRAOCULAR LENS IMPLANT Bilateral   . COLONOSCOPY    . COLONOSCOPY WITH PROPOFOL N/A 12/22/2018   Procedure: COLONOSCOPY WITH PROPOFOL;  Surgeon: Virgel Manifold, MD;  Location: [facility] ENDOSCOPY;  Service: Endoscopy;  Laterality: N/A;  . EYE SURGERY  Spring 2018   Cataracts both eyes  . TONSILLECTOMY      Social History:   Social History   Socioeconomic History  . Marital status: Single    Spouse name: Not on file  . Number of children: 1  . Years of education: Not on file  .  Highest education level: Bachelor's degree (e.g., BA, AB, BS)  Occupational History  . Occupation: retired   Tobacco Use  . Smoking status: Former Smoker    Packs/day: 0.50    Years: 6.00    Pack years: 3.00    Types: Cigarettes    Quit date: 02/11/1974    Years since quitting: 46.2  . Smokeless tobacco: Never Used  Vaping Use  . Vaping Use: Never used  Substance and Sexual Activity  . Alcohol use: Yes    Alcohol/week: 0.0 standard drinks    Comment: 2-3 drinks monthly  . Drug use: Never  . Sexual activity: Not Currently    Comment: Don't use not sexually active  Other Topics Concern  . Not on file  Social History Narrative   Raised an adopted child on her own    Working part time reviewed documents   Social Determinants of Health   Financial Resource Strain: Low Risk   . Difficulty of Paying Living Expenses: Not hard at all  Food Insecurity: No Food Insecurity  . Worried About Charity fundraiser in the Last Year: Never true  . Ran Out of Food in the Last Year: Never true  Transportation Needs: No Transportation Needs  . Lack of Transportation (Medical): No  . Lack of Transportation (Non-Medical): No  Physical Activity: Inactive  . Days of Exercise per Week: 0 days  . Minutes of Exercise per Session: 0 min  Stress: No Stress Concern Present  . Feeling of Stress : Not at all  Social Connections: Moderately Integrated  . Frequency of Communication with Friends and Family: More than three times a week  . Frequency of Social Gatherings with Friends and Family: Twice a week  . Attends Religious Services: More than 4 times per year  . Active Member of Clubs or Organizations: Yes  . Attends Archivist Meetings: More than 4 times per year  . Marital Status: Never married  Intimate Partner Violence: Not At Risk  . Fear of Current or Ex-Partner: No  . Emotionally Abused: No  . Physically Abused: No  . Sexually Abused: No    Allergies   Sulfamethoxazole-trimethoprim  Family history:   Family History  Problem Relation Age of Onset  . Early death Father        suicide  . Depression Father   . Diabetes Father   . Cancer Father        prostate  . Hearing loss Father        due to war  . Alzheimer's disease Mother   . Heart disease Brother   . Heart disease Maternal Aunt   . Dementia Maternal Aunt   . Heart disease Maternal Uncle   . Dementia Maternal Grandmother   . Heart disease Brother     Current Medications:   Prior to Admission medications   Medication Sig Start Date End Date Taking? Authorizing Provider  aspirin EC 81 MG tablet Take 81 mg by mouth See admin instructions. Take 1 tablet 4 times a week.    [provider]  atorvastatin (LIPITOR) 40 MG tablet Take 1 tablet (40 mg total) by mouth daily. 04/11/20   [name], MD  buPROPion (WELLBUTRIN SR) 150 MG 12 hr tablet Take 150 mg by mouth 2 (two) times daily.    Lew Dawes, MD  citalopram (CELEXA) 40 MG tablet Take 40 mg by mouth daily.    Lew Dawes, MD  clindamycin-benzoyl peroxide [facility]) gel Apply  topically 2 (two) times daily. 05/10/19   [name], MD  Cyanocobalamin (VITAMIN B-12) 1000 MCG SUBL Place 1,000 mcg under the tongue daily.    [provider]  levothyroxine (SYNTHROID) 75 MCG tablet Take 1 tablet (75 mcg total) by mouth daily before breakfast. 11/09/19   [name], MD  Multiple Vitamins-Minerals (MULTIVITAMIN WOMEN 50+ PO) Take 1 tablet by mouth daily.    [provider]  Omega-3 Fatty Acids (FISH OIL) 1000 MG CAPS Take 1 capsule by mouth See admin instructions. Take 1 capsule 4 times a week.    [provider]  SUMAtriptan (IMITREX) 100 MG tablet May repeat in 2 hours if headache persists or recurs. 11/09/19   [name], MD  topiramate (TOPAMAX) 25 MG tablet Take 25 mg by mouth daily.     Lew Dawes, MD  Vitamin D, Ergocalciferol, (DRISDOL) 1.25 MG (50000 UNIT) CAPS capsule TAKE 1 CAPSULE BY MOUTH EVERY 7 DAYS 04/02/20   [name], MD    Physical Exam:   Vitals:   04/19/20 1755  BP: (!) 125/105  Pulse: 62  Resp: 15  Temp: 98 F (36.7 C)  TempSrc: Oral  SpO2: 99%     Physical Exam: Blood pressure (!) 125/105, pulse 62, temperature 98 F (36.7 C), temperature source Oral, resp. rate 15, SpO2 99 %. Gen:  Eyes: sclera anicteric, conjuctiva mildly injected bilaterally CVS: S1-S2, regulary, no gallops Respiratory:  decreased air entry likely secondary to decreased inspiratory effort GI: NABS, soft, NT  LE: No edema. No cyanosis Neuro: A/O x 3, Moving all extremities equally with normal strength, CN 3-12 intact, grossly nonfocal.  Psych: patient is  logical and coherent, judgement and insight appear normal, mood and affect appropriate to situation. Skin: no rashes or lesions or ulcers,    Data Review:    Labs: Basic Metabolic Panel: Recent Labs  Lab 04/19/20 1322  NA 139  K 3.7  CL 106  CO2 25  GLUCOSE 104*  BUN 23  CREATININE 0.88  CALCIUM 8.9   Liver Function Tests: Recent Labs  Lab 04/19/20 1322  AST 22  ALT 15  ALKPHOS 156*  BILITOT 0.7  PROT 7.0  ALBUMIN 3.7   No results for input(s): LIPASE, AMYLASE in the last 168 hours. No results for input(s): AMMONIA in the last 168 hours. CBC: Recent Labs  Lab 04/19/20 1322  WBC 8.1  NEUTROABS 5.7  HGB 13.3  HCT 39.8  MCV 94.3  PLT 93*   Cardiac Enzymes: No results for input(s): CKTOTAL, CKMB, CKMBINDEX, TROPONINI in the last 168 hours.  BNP (last 3 results) No results for input(s): PROBNP in the last 8760 hours. CBG: No results for input(s): GLUCAP in the last 168 hours.  Urinalysis    Component Value Date/Time   COLORURINE YELLOW 04/19/2020 1322   APPEARANCEUR CLEAR 04/19/2020 1322   LABSPEC 1.010 04/19/2020 1322   PHURINE 6.5 04/19/2020 1322   GLUCOSEU NEGATIVE 04/19/2020 1322   HGBUR MODERATE (A) 04/19/2020 1322   BILIRUBINUR NEGATIVE 04/19/2020 1322   KETONESUR NEGATIVE 04/19/2020 1322   PROTEINUR NEGATIVE 04/19/2020 1322   NITRITE NEGATIVE 04/19/2020 1322   LEUKOCYTESUR NEGATIVE 04/19/2020 1322      Radiographic Studies: CT ANGIOGRAM HEAD NECK W WO CONTRAST  Result Date: 04/19/2020 CLINICAL DATA:  Code stroke follow-up EXAM: CT ANGIOGRAPHY HEAD AND NECK TECHNIQUE: Multidetector CT imaging of the head and neck was performed using the standard protocol during bolus administration of intravenous contrast. Multiplanar CT image reconstructions and  MIPs were obtained to evaluate the vascular anatomy. Carotid stenosis measurements (when applicable) are obtained utilizing NASCET criteria, using the distal internal carotid diameter as the  denominator. CONTRAST:  67mL OMNIPAQUE IOHEXOL 350 MG/ML SOLN COMPARISON:  02/29/2020 FINDINGS: CTA NECK Aortic arch: Great vessel origins are patent. Right carotid system: Patent.  No stenosis at the ICA origin. Left carotid system: Patent.  No stenosis at the ICA origin. Vertebral arteries: Patent. Right vertebral artery is dominant. No stenosis. Skeleton: Stable degenerative changes of the cervical spine. Other neck: No new finding. Upper chest: No new finding. Review of the MIP images confirms the above findings CTA HEAD Anterior circulation: Intracranial internal carotid arteries are patent. Left M1 MCA is patent. There is nearly occlusive thrombus within a proximal M2 branch approximately 6 mm from the bifurcation. Subsequent occlusion of a proximal left M3 branch. Right middle and both anterior cerebral arteries are patent. Posterior circulation: Intracranial internal carotid arteries are patent. Basilar artery is patent. Major cerebellar artery origins are patent. Posterior cerebral arteries are patent. Left posterior communicating artery is present. Venous sinuses: Patent as allowed by contrast bolus timing. Review of the MIP images confirms the above findings IMPRESSION: Nearly occlusive thrombus at the bifurcation of a proximal M2 MCA branch approximately 6 mm from the MCA bifurcation. Subsequent occlusion of a proximal left M3 MCA branch. No stenosis in the neck. These results were called by telephone at the time of interpretation on 04/19/2020 at 2:01 pm to provider [facility] , who verbally acknowledged these results. Electronically Signed   By: Macy Mis M.D.   On: 04/19/2020 14:10   CT HEAD CODE STROKE WO CONTRAST  Addendum Date: 04/19/2020   ADDENDUM REPORT: 04/19/2020 13:56 ADDENDUM: Under ASPECT score calculation there is a dictation error. Should state 7 rather than 6. Electronically Signed   By: Macy Mis M.D.   On: 04/19/2020 13:56   Result Date: 04/19/2020 CLINICAL DATA:   Code stroke. EXAM: CT HEAD WITHOUT CONTRAST TECHNIQUE: Contiguous axial images were obtained from the base of the skull through the vertex without intravenous contrast. COMPARISON:  None. FINDINGS: Brain: No acute intracranial hemorrhage, mass effect, or edema. No definite new loss of gray-white differentiation. Ventricles and sulci are normal in size and configuration. No extra-axial collection. Vascular: Focal density along a left M2 MCA branch within the sylvian fissure. Skull: Unremarkable. Sinuses/Orbits: No acute finding. Other: Mastoid air cells are clear. ASPECTS [facility] Stroke Program Early CT Score) - Ganglionic level infarction (caudate, lentiform nuclei, internal capsule, insula, M1-M3 cortex): 6 - Supraganglionic infarction (M4-M6 cortex): 3 Total score (0-10 with 10 being normal): 9 IMPRESSION: No acute intracranial hemorrhage.  No definite acute infarction. Possible dense left M2 MCA branch within the sylvian fissure. CTA pending. These results were called by telephone at the time of interpretation on 04/19/2020 at 1:42 pm to provider Lavonia Drafts , who verbally acknowledged these results. Electronically Signed: By: Macy Mis M.D. On: 04/19/2020 13:46    EKG: Independently reviewed.  Poor baseline.  Sinus rhythm at 75.  Left axis deviation at -50.  Q waves V1 through V3.  Poor R wave progression.  No acute ST-T wave changes.  Possible LVH.   Assessment/Plan:   Active Problems:   CVA (cerebral vascular accident) ([place])  70 year old female with acute left M2 branch near occlusion with occluded proximal M3 presents with dysarthria and difficulty thinking.  Patient had previously had diplopia.  CVA Discussed with neurology, will keep patient on strict bedrest with  head of bed flat to increase brain perfusion.  Saline bolus as needed also ordered. Continue aspirin 81 and Plavix 75 tomorrow, she had been loaded with higher doses earlier today at [facility].  Continue home doses of  atorvastatin. Echocardiogram was done last month and will not be repeated. Brain MRI ordered CT angiogram of head and neck as noted above. Formal neurology consult is pending SLP consult also ordered.  Anxiety and depression Continue bupropion, Topamax and Celexa per home meds    Other information:   DVT prophylaxis: Enoxaparin ordered. Code Status: Full Family Communication: Patient's daughter was at bedside throughout Disposition Plan: Home Consults called: Neurology Admission status: Inpatient  Srobona Tublu Chatterjee [facility]  If 7PM-7AM, please contact night-coverage [email or web address] Password [facility] 04/19/2020, 7:57 PM

## 2020-04-20 ENCOUNTER — Encounter (HOSPITAL_COMMUNITY): Payer: Self-pay | Admitting: Internal Medicine

## 2020-04-20 ENCOUNTER — Observation Stay (HOSPITAL_COMMUNITY): Payer: Medicare Other

## 2020-04-20 DIAGNOSIS — R197 Diarrhea, unspecified: Secondary | ICD-10-CM | POA: Diagnosis not present

## 2020-04-20 DIAGNOSIS — D259 Leiomyoma of uterus, unspecified: Secondary | ICD-10-CM | POA: Diagnosis not present

## 2020-04-20 DIAGNOSIS — N839 Noninflammatory disorder of ovary, fallopian tube and broad ligament, unspecified: Secondary | ICD-10-CM | POA: Diagnosis present

## 2020-04-20 DIAGNOSIS — E039 Hypothyroidism, unspecified: Secondary | ICD-10-CM | POA: Diagnosis present

## 2020-04-20 DIAGNOSIS — R471 Dysarthria and anarthria: Secondary | ICD-10-CM | POA: Diagnosis present

## 2020-04-20 DIAGNOSIS — Z713 Dietary counseling and surveillance: Secondary | ICD-10-CM | POA: Diagnosis not present

## 2020-04-20 DIAGNOSIS — Z8673 Personal history of transient ischemic attack (TIA), and cerebral infarction without residual deficits: Secondary | ICD-10-CM | POA: Diagnosis not present

## 2020-04-20 DIAGNOSIS — K8020 Calculus of gallbladder without cholecystitis without obstruction: Secondary | ICD-10-CM | POA: Diagnosis not present

## 2020-04-20 DIAGNOSIS — Z7982 Long term (current) use of aspirin: Secondary | ICD-10-CM | POA: Diagnosis not present

## 2020-04-20 DIAGNOSIS — H9190 Unspecified hearing loss, unspecified ear: Secondary | ICD-10-CM | POA: Diagnosis present

## 2020-04-20 DIAGNOSIS — Z882 Allergy status to sulfonamides status: Secondary | ICD-10-CM | POA: Diagnosis not present

## 2020-04-20 DIAGNOSIS — Z7901 Long term (current) use of anticoagulants: Secondary | ICD-10-CM | POA: Diagnosis not present

## 2020-04-20 DIAGNOSIS — I63512 Cerebral infarction due to unspecified occlusion or stenosis of left middle cerebral artery: Secondary | ICD-10-CM | POA: Diagnosis present

## 2020-04-20 DIAGNOSIS — R29700 NIHSS score 0: Secondary | ICD-10-CM | POA: Diagnosis present

## 2020-04-20 DIAGNOSIS — G43009 Migraine without aura, not intractable, without status migrainosus: Secondary | ICD-10-CM | POA: Diagnosis not present

## 2020-04-20 DIAGNOSIS — E038 Other specified hypothyroidism: Secondary | ICD-10-CM | POA: Diagnosis not present

## 2020-04-20 DIAGNOSIS — E785 Hyperlipidemia, unspecified: Secondary | ICD-10-CM | POA: Diagnosis present

## 2020-04-20 DIAGNOSIS — Z6833 Body mass index (BMI) 33.0-33.9, adult: Secondary | ICD-10-CM | POA: Diagnosis not present

## 2020-04-20 DIAGNOSIS — I119 Hypertensive heart disease without heart failure: Secondary | ICD-10-CM | POA: Diagnosis present

## 2020-04-20 DIAGNOSIS — D696 Thrombocytopenia, unspecified: Secondary | ICD-10-CM | POA: Diagnosis present

## 2020-04-20 DIAGNOSIS — Z79899 Other long term (current) drug therapy: Secondary | ICD-10-CM | POA: Diagnosis not present

## 2020-04-20 DIAGNOSIS — Z20822 Contact with and (suspected) exposure to covid-19: Secondary | ICD-10-CM | POA: Diagnosis present

## 2020-04-20 DIAGNOSIS — E669 Obesity, unspecified: Secondary | ICD-10-CM | POA: Diagnosis present

## 2020-04-20 DIAGNOSIS — I639 Cerebral infarction, unspecified: Secondary | ICD-10-CM | POA: Diagnosis present

## 2020-04-20 DIAGNOSIS — Z818 Family history of other mental and behavioral disorders: Secondary | ICD-10-CM | POA: Diagnosis not present

## 2020-04-20 DIAGNOSIS — N83202 Unspecified ovarian cyst, left side: Secondary | ICD-10-CM | POA: Diagnosis not present

## 2020-04-20 DIAGNOSIS — Z79890 Hormone replacement therapy (postmenopausal): Secondary | ICD-10-CM | POA: Diagnosis not present

## 2020-04-20 DIAGNOSIS — F419 Anxiety disorder, unspecified: Secondary | ICD-10-CM | POA: Diagnosis present

## 2020-04-20 DIAGNOSIS — I6300 Cerebral infarction due to thrombosis of unspecified precerebral artery: Secondary | ICD-10-CM

## 2020-04-20 DIAGNOSIS — N838 Other noninflammatory disorders of ovary, fallopian tube and broad ligament: Secondary | ICD-10-CM | POA: Diagnosis not present

## 2020-04-20 DIAGNOSIS — I6389 Other cerebral infarction: Secondary | ICD-10-CM | POA: Diagnosis not present

## 2020-04-20 DIAGNOSIS — I081 Rheumatic disorders of both mitral and tricuspid valves: Secondary | ICD-10-CM | POA: Diagnosis not present

## 2020-04-20 DIAGNOSIS — I63412 Cerebral infarction due to embolism of left middle cerebral artery: Secondary | ICD-10-CM | POA: Diagnosis not present

## 2020-04-20 DIAGNOSIS — N83292 Other ovarian cyst, left side: Secondary | ICD-10-CM | POA: Diagnosis not present

## 2020-04-20 DIAGNOSIS — Z87891 Personal history of nicotine dependence: Secondary | ICD-10-CM | POA: Diagnosis not present

## 2020-04-20 DIAGNOSIS — F32A Depression, unspecified: Secondary | ICD-10-CM | POA: Diagnosis present

## 2020-04-20 DIAGNOSIS — M810 Age-related osteoporosis without current pathological fracture: Secondary | ICD-10-CM | POA: Diagnosis present

## 2020-04-20 LAB — LIPID PANEL
Cholesterol: 87 mg/dL (ref 0–200)
HDL: 31 mg/dL — ABNORMAL LOW (ref >40)
LDL Cholesterol: 45 mg/dL (ref 0–99)
Total CHOL/HDL Ratio: 2.8 RATIO
Triglycerides: 54 mg/dL (ref <150)
VLDL: 11 mg/dL (ref 0–40)

## 2020-04-20 LAB — SAVE SMEAR(SSMR), FOR PROVIDER SLIDE REVIEW

## 2020-04-20 LAB — DIC (DISSEMINATED INTRAVASCULAR COAGULATION)PANEL
D-Dimer, Quant: 3.54 ug/mL-FEU — ABNORMAL HIGH (ref 0.00–0.50)
Fibrinogen: 432 mg/dL (ref 210–475)
INR: 1.1 (ref 0.8–1.2)
Platelets: 68 10*3/uL — ABNORMAL LOW (ref 150–400)
Prothrombin Time: 13.9 seconds (ref 11.4–15.2)
Smear Review: NONE SEEN
aPTT: 29 seconds (ref 24–36)

## 2020-04-20 LAB — HEMOGLOBIN A1C
Hgb A1c MFr Bld: 5 % (ref 4.8–5.6)
Mean Plasma Glucose: 96.8 mg/dL

## 2020-04-20 LAB — PATHOLOGIST SMEAR REVIEW

## 2020-04-20 IMAGING — US US ABDOMEN LIMITED RUQ/ASCITES
1 series · 14 of 25 positions shown · non-contrast
Comparison: None.

CLINICAL DATA: Thrombocytopenia.  Inpatient.

EXAM:
ULTRASOUND ABDOMEN LIMITED RIGHT UPPER QUADRANT

[Series 1: us abdomen limited ruq (liver/gb) · 14 of 41 slices shown]
[im 1/41]
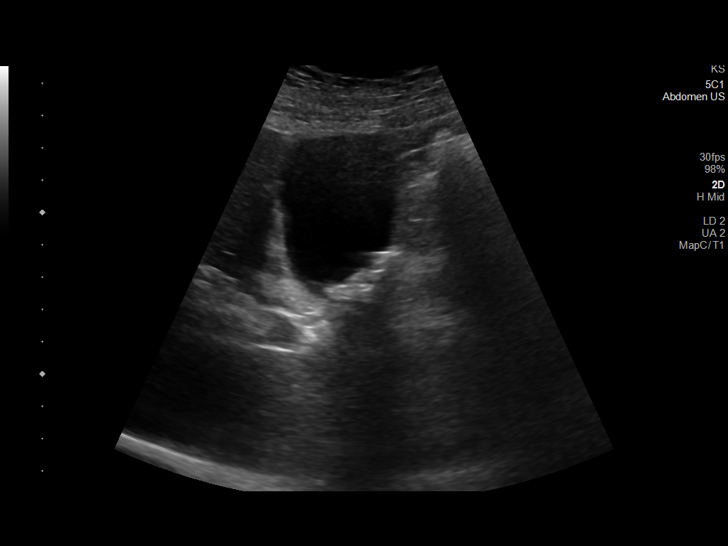
[im 4/41]
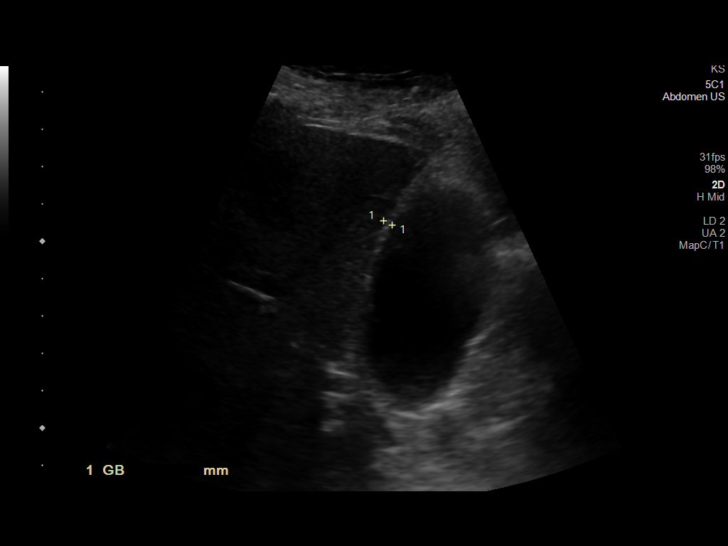
[im 7/41]
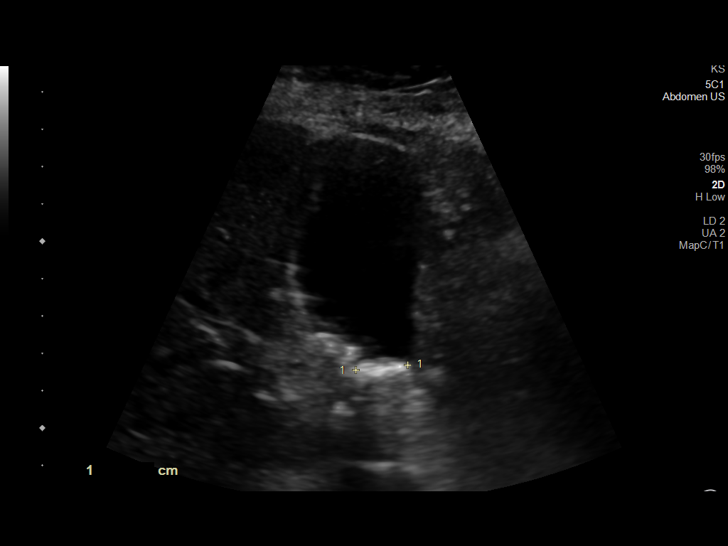
[im 11/41]
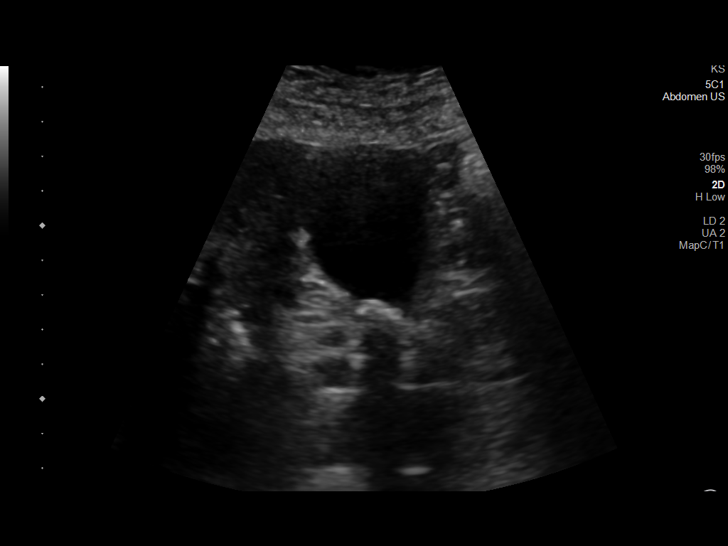
[im 14/41]
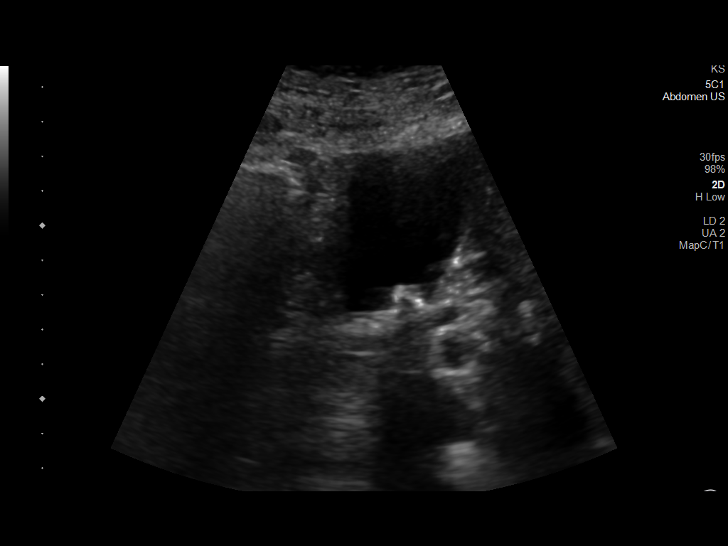
[im 16/41]
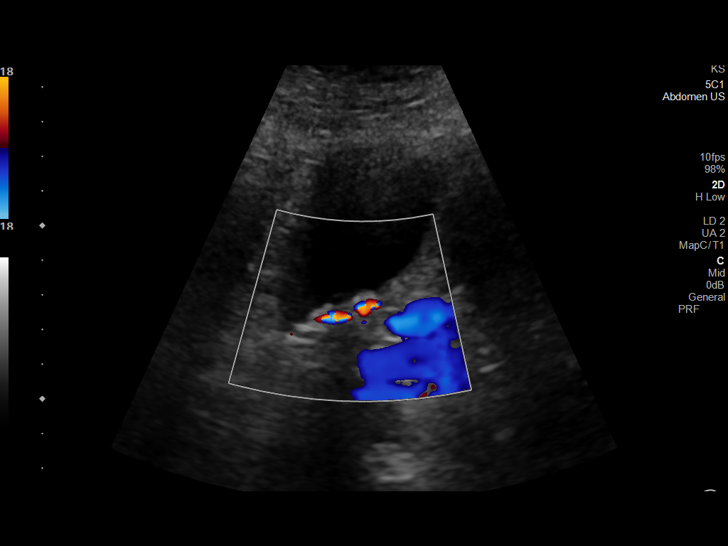
[im 19/41]
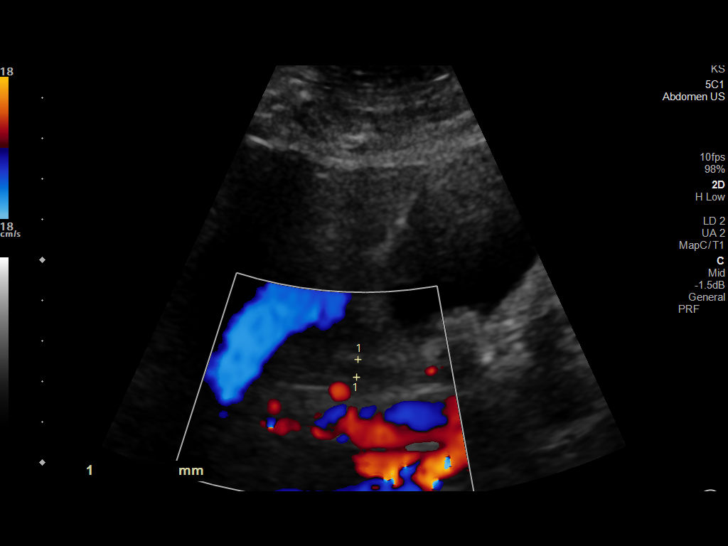
[im 22/41]
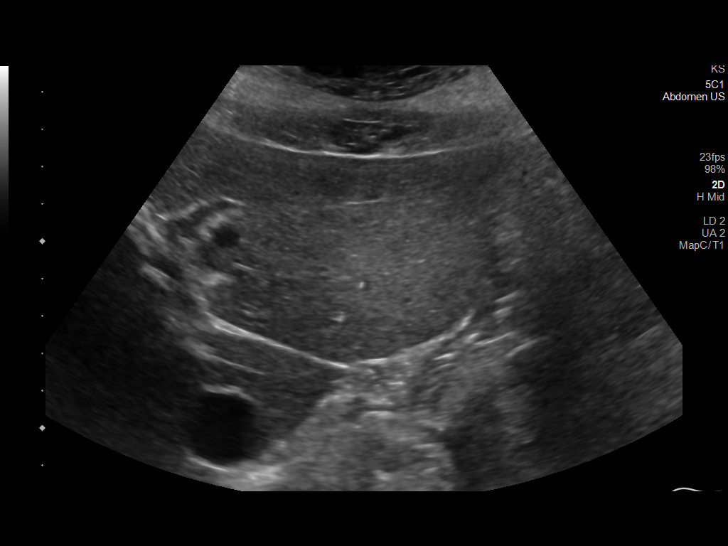
[im 26/41]
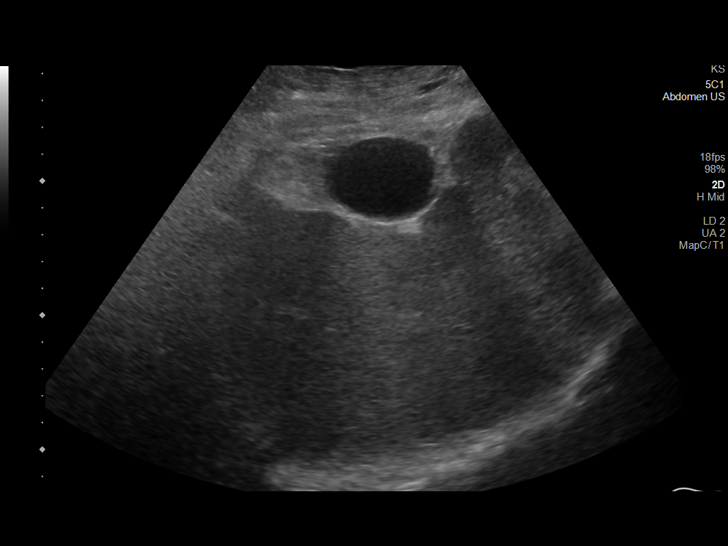
[im 27/41]
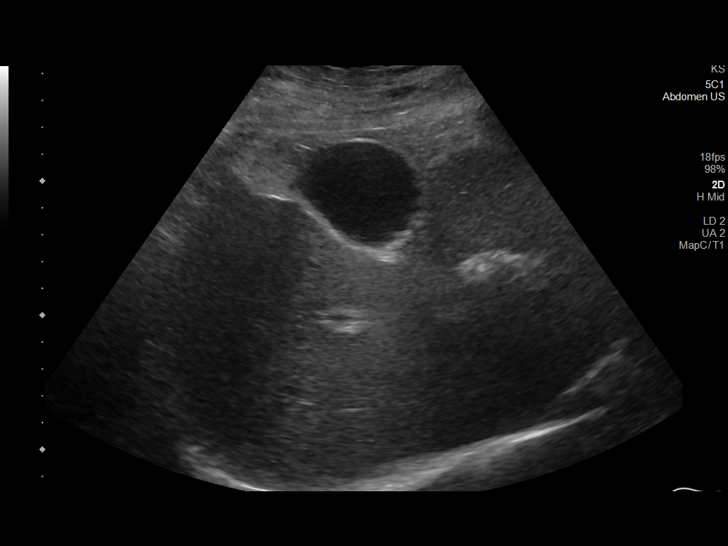
[im 31/41]
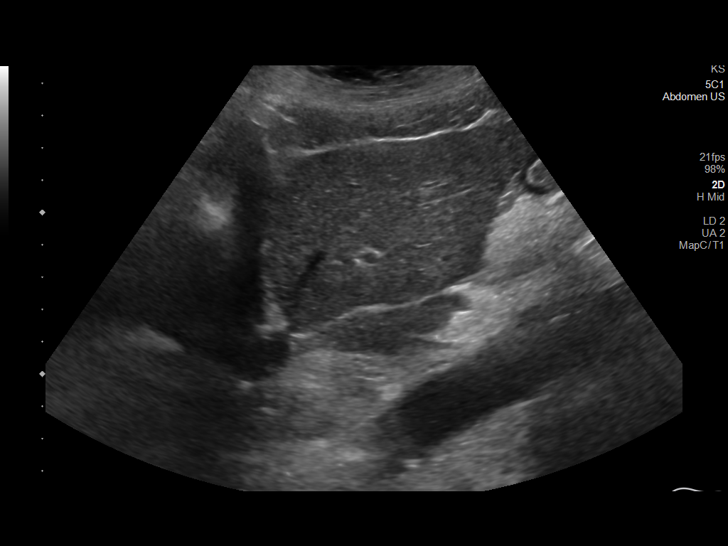
[im 34/41]
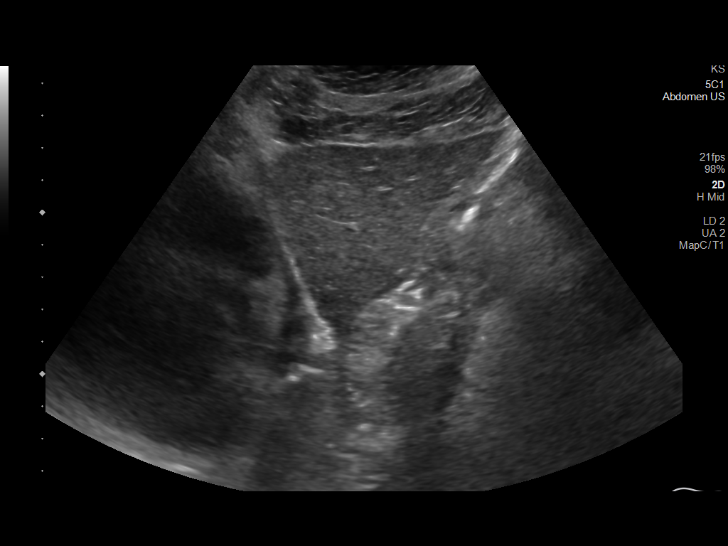
[im 37/41]
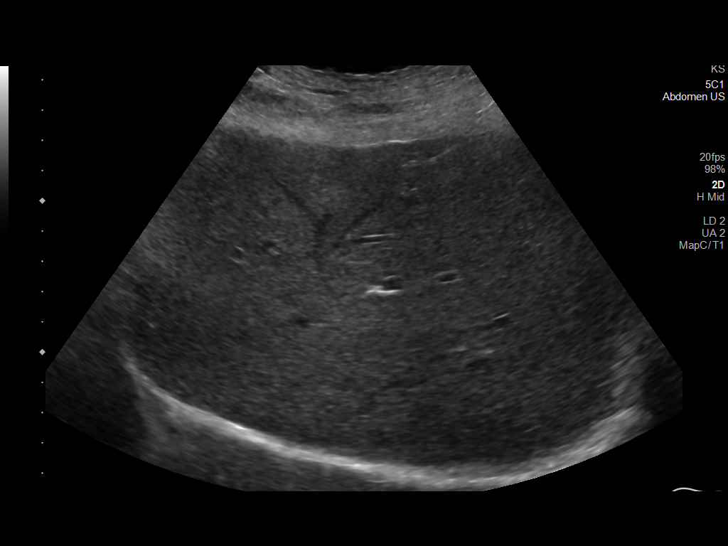
[im 41/41]
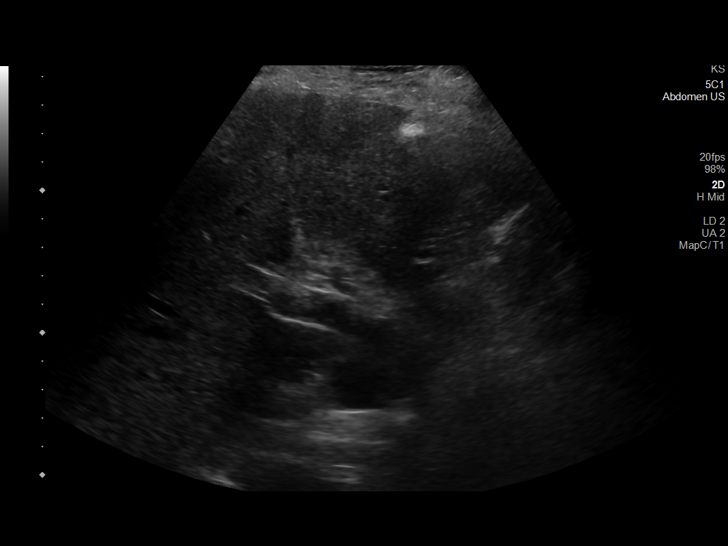

[14 of 25 positions shown; findings below may reference images not displayed]

FINDINGS: Gallbladder:

Numerous layering shadowing gallstones in the gallbladder, largest
1.4 cm. No gallbladder wall thickening. No pericholecystic fluid. No
sonographic Murphy's sign.

Common bile duct:

Diameter: 6 mm

Liver:

Top-normal liver parenchymal echogenicity. No liver masses. Portal
vein is patent on color Doppler imaging with normal direction of
blood flow towards the liver.

Other: None.
IMPRESSION: 1. Cholelithiasis.  No evidence of acute cholecystitis.
2. Top normal caliber common bile duct (6 mm diameter). Suggest
correlation with serum bilirubin levels. MRI abdomen with MRCP may
be considered as clinically warranted.
3. Normal liver.

## 2020-04-20 MED ORDER — CITALOPRAM HYDROBROMIDE 10 MG PO TABS
20.0000 mg | ORAL_TABLET | Freq: Every day | ORAL | Status: DC
  Administered 2020-04-21 – 2020-04-24 (×4): 20 mg via ORAL
  Filled 2020-04-20 (×4): qty 2

## 2020-04-20 NOTE — Progress Notes (Signed)
    [facility] has been requested to perform a transesophageal echocardiogram on this patient for stroke.  After careful review of history and examination, the risks and benefits of transesophageal echocardiogram have been explained including risks of esophageal damage, perforation (1:10,000 risk), bleeding, pharyngeal hematoma as well as other potential complications associated with conscious sedation/MAC including aspiration, arrhythmia, respiratory failure and death. Alternatives to treatment were discussed, questions were answered. Patient is willing to proceed. Scheduled for Monday at 2pm, orders written including NPO after midnight Monday except sips with meds. Also sent message to cardmaster to ensure she is aware of request for loop as well seen in notes.  Charlie Pitter, PA-C 04/20/2020 5:51 PM

## 2020-04-20 NOTE — Evaluation (Signed)
Occupational Therapy Evaluation Patient Details Name: Natalie [name] MRN: [id number] DOB: October 15, 1950 Today's Date: 04/20/2020    History of Present Illness The  pt is a 70 yo female presenting 3/17 with confusion and difficulty word-finding. Imaging revealed L MCA infarcts with nearly occlusive thrombus in proximal M2 and M3. PMH includes: CVA in Jan 2022, anxiety, hypothyroidism.   Clinical Impression   PTA, pt was living alone and was independent; reporting feeling more fatigued since last CVA and unable to perform her IADLs as she would like. Pt performing ADLs and functional mobility at Supervision level. Noting decreased coordination at [place] and smooth tracking at R superior field. Provided education on handout on [facility] for ADLs and IADLs. Recommend dc to home once medically stable per physician. All acute OT needs met and will sign off.     Follow Up Recommendations  No OT follow up;Supervision - Intermittent    Equipment Recommendations  Tub/shower bench    Recommendations for Other Services       Precautions / Restrictions Precautions Precautions: Fall Precaution Comments: low fall Restrictions Weight Bearing Restrictions: No      Mobility Bed Mobility Overal bed mobility: Independent                  Transfers Overall transfer level: Needs assistance Equipment used: None Transfers: Sit to/from Stand Sit to Stand: Supervision         General transfer comment: supervision for safety. no assist needed pt able to stand without cues or UE support, no LOB    Balance Overall balance assessment: Mild deficits observed, not formally tested                                         ADL either performed or assessed with clinical judgement   ADL Overall ADL's : Needs assistance/impaired                                       General ADL Comments: Pt performing ADLs and functional mobility at Supervision level. Pt performing  grooming at sink     Vision Baseline Vision/History: Wears glasses Wears Glasses: Reading only Patient Visual Report: No change from baseline Vision Assessment?: Yes Eye Alignment: Within Functional Limits Ocular Range of Motion: Within Functional Limits Alignment/Gaze Preference: Within Defined Limits Tracking/Visual Pursuits: Decreased smoothness of eye movement to RIGHT superior field Convergence: Impaired (comment) (R eye not converging with L) Additional Comments: Noting decreased smooth tracking and noted jumpy movement with tracking to right superior visual field. Denied any diplopia     Perception     Praxis      Pertinent Vitals/Pain Pain Assessment: No/denies pain     Hand Dominance Right   Extremity/Trunk Assessment Upper Extremity Assessment Upper Extremity Assessment: RUE deficits/detail RUE Deficits / Details: Decreased coorindation with finger to nose and opposition. functional useing oral care at sink RUE Coordination: decreased gross motor   Lower Extremity Assessment Lower Extremity Assessment: Defer to PT evaluation RLE Deficits / Details: grossly 4+/5, most significant difference at ankle DF and knee ext. pt reprots decreased sensation prior to original stroke. no new change RLE Sensation: decreased light touch   Cervical / Trunk Assessment Cervical / Trunk Assessment: Normal   Communication Communication Communication: No difficulties   Cognition Arousal/Alertness: Awake/alert Behavior During  Therapy: WFL for tasks assessed/performed Overall Cognitive Status: Within Functional Limits for tasks assessed                                     General Comments  VSS on RA, daughter present and supportive    Exercises Exercises: Other exercises Other Exercises Other Exercises: Providing handout for EC for ADLs and IADLs. Reviewed EC concept with pt and daughter   Shoulder Instructions      Home Living Family/patient expects to be  discharged to:: Private residence Living Arrangements: Children Available Help at Discharge: Family;Available PRN/intermittently (available 24/7 initially, then PRN) Type of Home: House Home Access: Stairs to enter [employer] of Steps: 4 Entrance Stairs-Rails: Right;Can reach both;Left Home Layout: Two level;Able to live on main level with bedroom/bathroom     Bathroom Shower/Tub: Teacher, early years/pre: Standard     Home Equipment: None          Prior Functioning/Environment Level of Independence: Independent        Comments: Indep with ADLs, household and community mobilization; denies fall history.        OT Problem List: Decreased activity tolerance;Decreased knowledge of precautions;Decreased knowledge of use of DME or AE      OT Treatment/Interventions:      OT Goals(Current goals can be found in the care plan section) Acute Rehab OT Goals Patient Stated Goal: return home OT Goal Formulation: All assessment and education complete, DC therapy  OT Frequency:     Barriers to D/C:            Co-evaluation              AM-PAC OT "6 Clicks" Daily Activity     Outcome Measure Help from another person eating meals?: None Help from another person taking care of personal grooming?: A Little Help from another person toileting, which includes using toliet, bedpan, or urinal?: A Little Help from another person bathing (including washing, rinsing, drying)?: A Little Help from another person to put on and taking off regular upper body clothing?: None Help from another person to put on and taking off regular lower body clothing?: A Little 6 Click Score: 20   End of Session Nurse Communication: Mobility status  Activity Tolerance: Patient tolerated treatment well Patient left: in bed;with call bell/phone within reach;with family/visitor present  OT Visit Diagnosis: Unsteadiness on feet (R26.81);Muscle weakness (generalized) (M62.81)                 Time: 4782-9562 OT Time Calculation (min): 22 min Charges:  OT General Charges $OT Visit: 1 Visit OT Evaluation $OT Eval Low Complexity: [place], OTR/L Acute Rehab Pager: [phone] Office: [place] 04/20/2020, 4:43 PM

## 2020-04-20 NOTE — Progress Notes (Signed)
SLP Cancellation Note  Patient Details Name: Natalie [name] MRN: [id number] DOB: 02/22/1950   Cancelled treatment:       Reason Eval/Treat Not Completed: Patient at procedure or test/unavailable    Osie Bond., M.A. [place] [facility] Pager [phone] Office [phone]  04/20/2020, 1:04 PM

## 2020-04-20 NOTE — Progress Notes (Addendum)
STROKE TEAM PROGRESS NOTE   INTERVAL HISTORY No acute events overnight. She reports that she woke up fine yesterday but that around 9:30 AM she noticed issues while she was in the kitchen. She said she dropped a mug but could not think of what to do. She began to open drawers in the kitchen and it took her a while to figure out she needed to pick the mug up. She also noticed she had difficulty finding words. She reports that she did not have any double vision like she did during her previous stroke. She then came to the ED.  Hospitalist Service is Primary Contacted Cardiology for TEE and Loop Recorder placement for Monday. Vitals:   04/20/20 0200 04/20/20 0557 04/20/20 0808 04/20/20 1150  BP: (!) 124/95 140/69 (!) 143/78 132/79  Pulse: 60 60 (!) 57 64  Resp: 15  16 20   Temp: 97.9 F (36.6 C) 97.9 F (36.6 C) 98 F (36.7 C) 97.7 F (36.5 C)  TempSrc: Oral Oral Oral Axillary  SpO2: 97% 98% 97% 98%   CBC:  Recent Labs  Lab 04/19/20 1322 04/19/20 2025 04/20/20 0741  WBC 8.1 7.9  --   NEUTROABS 5.7  --   --   HGB 13.3 12.0  --   HCT 39.8 37.0  --   MCV 94.3 95.4  --   PLT 93* 77* 68*   Basic Metabolic Panel:  Recent Labs  Lab 04/19/20 1322 04/19/20 2025  NA 139  --   K 3.7  --   CL 106  --   CO2 25  --   GLUCOSE 104*  --   BUN 23  --   CREATININE 0.88 0.75  CALCIUM 8.9  --    Lipid Panel:  Recent Labs  Lab 04/20/20 0355  CHOL 87  TRIG 54  HDL 31*  CHOLHDL 2.8  VLDL 11  LDLCALC 45   HgbA1c:  Recent Labs  Lab 04/20/20 0355  HGBA1C 5.0   Urine Drug Screen:  Recent Labs  Lab 04/19/20 1322  LABOPIA NONE DETECTED  COCAINSCRNUR NONE DETECTED  LABBENZ NONE DETECTED  AMPHETMU NONE DETECTED  THCU NONE DETECTED  LABBARB NONE DETECTED    Alcohol Level  Recent Labs  Lab 04/19/20 1322  ETH <10    IMAGING past 24 hours CT ANGIOGRAM HEAD NECK W WO CONTRAST  Result Date: 04/19/2020 CLINICAL DATA:  Code stroke follow-up EXAM: CT ANGIOGRAPHY HEAD AND NECK  TECHNIQUE: Multidetector CT imaging of the head and neck was performed using the standard protocol during bolus administration of intravenous contrast. Multiplanar CT image reconstructions and MIPs were obtained to evaluate the vascular anatomy. Carotid stenosis measurements (when applicable) are obtained utilizing NASCET criteria, using the distal internal carotid diameter as the denominator. CONTRAST:  6mL OMNIPAQUE IOHEXOL 350 MG/ML SOLN COMPARISON:  02/29/2020 FINDINGS: CTA NECK Aortic arch: Great vessel origins are patent. Right carotid system: Patent.  No stenosis at the ICA origin. Left carotid system: Patent.  No stenosis at the ICA origin. Vertebral arteries: Patent. Right vertebral artery is dominant. No stenosis. Skeleton: Stable degenerative changes of the cervical spine. Other neck: No new finding. Upper chest: No new finding. Review of the MIP images confirms the above findings CTA HEAD Anterior circulation: Intracranial internal carotid arteries are patent. Left M1 MCA is patent. There is nearly occlusive thrombus within a proximal M2 branch approximately 6 mm from the bifurcation. Subsequent occlusion of a proximal left M3 branch. Right middle and both anterior cerebral arteries are  patent. Posterior circulation: Intracranial internal carotid arteries are patent. Basilar artery is patent. Major cerebellar artery origins are patent. Posterior cerebral arteries are patent. Left posterior communicating artery is present. Venous sinuses: Patent as allowed by contrast bolus timing. Review of the MIP images confirms the above findings IMPRESSION: Nearly occlusive thrombus at the bifurcation of a proximal M2 MCA branch approximately 6 mm from the MCA bifurcation. Subsequent occlusion of a proximal left M3 MCA branch. No stenosis in the neck. These results were called by telephone at the time of interpretation on 04/19/2020 at 2:01 pm to provider [facility] , who verbally acknowledged these results.  Electronically Signed   By: Macy Mis M.D.   On: 04/19/2020 14:10   MR BRAIN WO CONTRAST  Result Date: 04/19/2020 CLINICAL DATA:  Initial evaluation for acute stroke. EXAM: MRI HEAD WITHOUT CONTRAST TECHNIQUE: Multiplanar, multiecho pulse sequences of the brain and surrounding structures were obtained without intravenous contrast. COMPARISON:  Prior CTs from earlier the same day as well as previous MRI from 02/28/2020. FINDINGS: Brain: Generalized age appropriate cerebral volume. Mild chronic microvascular ischemic disease again noted. Patchy small volume foci of restricted diffusion seen involving the cortical and subcortical aspect of the posterior left frontoparietal and temporal region, consistent with posterior left MCA distribution infarcts. For reference purposes, the largest of these foci seen at the posterior left temporal region and measures 8 mm. Findings are embolic in distribution. No associated hemorrhage or mass effect. Additional apparent subcentimeter focus of mild diffusion abnormality at the posterior right frontoparietal centrum semi ovale favored to reflect T2 shine through (series 2, image 34). No other evidence for acute or subacute ischemia. Gray-white matter differentiation otherwise maintained. No acute intracranial hemorrhage. Small focus of susceptibility artifact at the posterior right temporal region noted, consistent with a small chronic microhemorrhage. Additional minimal residual chronic blood products noted about the occipital regions related to recently identified ischemic infarcts. No mass lesion, midline shift or mass effect. No hydrocephalus or extra-axial fluid collection. Pituitary gland suprasellar region within normal limits. Midline structures intact. Vascular: Hypoplastic left vertebral artery not well seen. Major intracranial vascular flow voids are otherwise maintained. Skull and upper cervical spine: Craniocervical junction within normal limits. Bone marrow  signal intensity normal. No focal marrow replacing lesion. No scalp soft tissue abnormality. Sinuses/Orbits: Patient status post bilateral ocular lens replacement. Globes and orbital soft tissues demonstrate no acute finding. Paranasal sinuses are largely clear. No significant mastoid effusion. Inner ear structures grossly normal. Other: None. IMPRESSION: 1. Patchy small volume acute ischemic nonhemorrhagic posterior left MCA distribution infarcts, likely reflecting an embolic shower. No associated hemorrhage or mass effect. 2. No other acute intracranial abnormality. 3. Underlying mild chronic microvascular ischemic disease. Electronically Signed   By: Jeannine Boga M.D.   On: 04/19/2020 23:39   CT HEAD CODE STROKE WO CONTRAST  Addendum Date: 04/19/2020   ADDENDUM REPORT: 04/19/2020 13:56 ADDENDUM: Under ASPECT score calculation there is a dictation error. Should state 7 rather than 6. Electronically Signed   By: Macy Mis M.D.   On: 04/19/2020 13:56   Result Date: 04/19/2020 CLINICAL DATA:  Code stroke. EXAM: CT HEAD WITHOUT CONTRAST TECHNIQUE: Contiguous axial images were obtained from the base of the skull through the vertex without intravenous contrast. COMPARISON:  None. FINDINGS: Brain: No acute intracranial hemorrhage, mass effect, or edema. No definite new loss of gray-white differentiation. Ventricles and sulci are normal in size and configuration. No extra-axial collection. Vascular: Focal density along a left M2 MCA  branch within the sylvian fissure. Skull: Unremarkable. Sinuses/Orbits: No acute finding. Other: Mastoid air cells are clear. ASPECTS [facility] Stroke Program Early CT Score) - Ganglionic level infarction (caudate, lentiform nuclei, internal capsule, insula, M1-M3 cortex): 6 - Supraganglionic infarction (M4-M6 cortex): 3 Total score (0-10 with 10 being normal): 9 IMPRESSION: No acute intracranial hemorrhage.  No definite acute infarction. Possible dense left M2 MCA branch  within the sylvian fissure. CTA pending. These results were called by telephone at the time of interpretation on 04/19/2020 at 1:42 pm to provider Lavonia Drafts , who verbally acknowledged these results. Electronically Signed: By: Macy Mis M.D. On: 04/19/2020 13:46   US Abdomen Limited RUQ (LIVER/GB)  Result Date: 04/20/2020 CLINICAL DATA:  Thrombocytopenia.  Inpatient. EXAM: ULTRASOUND ABDOMEN LIMITED RIGHT UPPER QUADRANT COMPARISON:  None. FINDINGS: Gallbladder: Numerous layering shadowing gallstones in the gallbladder, largest 1.4 cm. No gallbladder wall thickening. No pericholecystic fluid. No sonographic Murphy's sign. Common bile duct: Diameter: 6 mm Liver: Top-normal liver parenchymal echogenicity. No liver masses. Portal vein is patent on color Doppler imaging with normal direction of blood flow towards the liver. Other: None. IMPRESSION: 1. Cholelithiasis.  No evidence of acute cholecystitis. 2. Top normal caliber common bile duct (6 mm diameter). Suggest correlation with serum bilirubin levels. MRI abdomen with MRCP may be considered as clinically warranted. 3. Normal liver. Electronically Signed   By: Ilona Sorrel M.D.   On: 04/20/2020 13:21    PHYSICAL EXAM Blood pressure 132/79, pulse 64, temperature 97.7 F (36.5 C), temperature source Axillary, resp. rate 20, SpO2 98 %.  General: alert and awake, elderly caucasian female, no apparent distress  Lungs: Symmetrical Chest rise, no labored breathing  Cardio: Regular Rate and Rhythm  Abdomen: Soft, non-tender  Neuro: Alert, oriented, thought content appropriate.  Speech fluent without evidence of aphasia.  Able to follow all commands without difficulty. Cranial Nerves: II:  Visual fields grossly normal, pupils equal, round, reactive to light and accommodation III,IV, VI: ptosis not present, extra-ocular motions intact bilaterally V,VII: smile symmetric, facial light touch sensation normal bilaterally VIII: hearing normal  bilaterally IX,X: uvula rises symmetrically XI: bilateral shoulder shrug XII: midline tongue extension without atrophy or fasciculations  Motor: Right : Upper extremity   5/5    Left:     Upper extremity   5/5  Lower extremity   5/5     Lower extremity   5/5 Tone and bulk:normal tone throughout; no atrophy noted Sensory: light touch intact throughout, bilaterally Cerebellar: normal finger-to-nose Gait: deferred    ASSESSMENT/PLAN Ms. YEILA MORRO is a 70 y.o. female with history of anxiety and depression and hypothyroidism who had a stroke 3 months ago in January 2022 when she presented with diplopia presenting with difficulty speaking. She describes the event as walking into kitchen with a cup in her hand and suddenly dropped it. She stared at cup while thinking "...did not know what to do" which is out of character for her. On arrival to ED she had difficulty speaking however symptoms have since completely resolved. She reports her symptoms have gradually improved although she still has some difficulty speaking. She denies extremity weakness.  She appears to have returned to baseline. However, with these new strokes and previous Holter monitor being negative will have TEE and Loop recorder placed on Monday. She has had a significant decline in platelet count since being admitted 93>77>68. Hospitalist service is working this up with a Blood Smear and Liver US.  Acute left MCA infarcts with  a nearly occlusive thrombus in the proximal M2 and M3 branches most likely embolus from cardiac Origin  Code Stroke CT head - No acute intracranial hemorrhage.  No definite acute infarction. Possible dense left M2 MCA branch within the sylvian fissure. CTA pending.  CTA head and neck - Nearly occlusive thrombus at the bifurcation of a proximal M2 MCA branch approximately 6 mm from the MCA bifurcation. Subsequent occlusion of a proximal left M3 MCA branch. No stenosis in the neck.  MRI  - Patchy  small volume acute ischemic nonhemorrhagic posterior left MCA distribution infarcts, likely reflecting an embolic shower. No associated hemorrhage or mass effect. No other acute intracranial abnormality. Underlying mild chronic microvascular ischemic disease.  LDL 45  HgbA1c 5.0  VTE prophylaxis - Lovenox    Diet   Diet Heart Room service appropriate? Yes; Fluid consistency: Thin    aspirin 81 mg daily prior to admission, now on aspirin 81 mg daily and Brilinta (ticagrelor) 90 mg bid for 30 days then will discontinue both and start Plavix.  Therapy recommendations:  Pending  Disposition:  Pending Work up  Hypertension  Home meds:  None  Stable . Permissive hypertension (OK if < 220/120) but gradually normalize in 5-7 days . Long-term BP goal normotensive  Hyperlipidemia  Home meds:  Lipitor, resumed in hospital  LDL 45, goal < 70  Currently at target will not make any changes   High intensity statin Lipitor 40 mg daily  Continue statin at discharge   Other Stroke Risk Factors  Advanced Age >/= 31   Former Cigarette smoker  Obesity, There is no height or weight on file to calculate BMI., BMI >/= 30 associated with increased stroke risk, recommend weight loss, diet and exercise as appropriate   Hx stroke (January 2022)  Migraines   Other Active Problems  Thrombocytopenia - LFT's normal, undergoing [place] of liver  Hypothyroidism - Synthroid 75 mcg daily  Hospital day # 1  Fatima Sanger MD Resident I have personally obtained history,examined this patient, reviewed notes, independently viewed imaging studies, participated in medical decision making and plan of care.ROS completed by me personally and pertinent positives fully documented  I have made any additions or clarifications directly to the above note. Agree with note above.  She presented with sudden onset of aphasia and confusion MRI showed a left MCA branch embolic infarct and CT angiogram showed left  occlusion.  Recommend continue ongoing stroke work-up and check TEE for cardiac source of embolism and loop recorder for paroxysmal A. fib.  Recommend aspirin 81 mg and Brilinta 90 mg twice daily for 4 weeks followed by Plavix alone and aggressive risk factor modification.  Greater than 50% time during this 35-minute visit was spent on counseling and coordination of care about embolic stroke and discussion about evaluation and  Antony Contras, MD Medical Director [place] Pager: [phone] 04/20/2020 2:22 PM   To contact Stroke Continuity provider, please refer to [email or web address] After hours, contact General Neurology

## 2020-04-20 NOTE — Evaluation (Signed)
Physical Therapy Evaluation Patient Details Name: Natalie [name] MRN: [id number] DOB: 05/07/1950 Today's Date: 04/20/2020   History of Present Illness  The  pt is a 70 yo female presenting 3/17 with confusion and difficulty word-finding. Imaging revealed L MCA infarcts with nearly occlusive thrombus in proximal M2 and M3. PMH includes: CVA in Jan 2022, anxiety, hypothyroidism.    Clinical Impression  Pt in bed upon arrival of PT, agreeable to evaluation at this time. Prior to admission the pt was living alone, independent with all mobility without use of AD, independent with ADLs and IADLs after stroke in January. The pt now presents with minor limitations in functional mobility, strength in RLE, and dynamic stability due to above dx, and will continue to benefit from skilled PT to address these deficits, but is safe to d/c home with family for assist as needed. The pt was able to demo transfers with supervision for safety, and completed hallway ambulation with minG and no AD with no LOB. The pt was also able to complete multiple steps with each LE, with either BUE or single UE support. The pt will benefit from skilled PT acutely to further challenge and assess dynamic stability, but will be safe to d/c home with family when medically cleared.      Follow Up Recommendations Outpatient PT;Supervision for mobility/OOB    Equipment Recommendations   (tub bench)    Recommendations for Other Services       Precautions / Restrictions Precautions Precautions: Fall Precaution Comments: low fall Restrictions Weight Bearing Restrictions: No      Mobility  Bed Mobility Overal bed mobility: Independent                  Transfers Overall transfer level: Needs assistance Equipment used: None Transfers: Sit to/from Stand Sit to Stand: Supervision         General transfer comment: supervision for safety. no assist needed pt able to stand without cues or UE support, no  LOB  Ambulation/Gait Ambulation/Gait assistance: Supervision Gait Distance (Feet): 250 Feet Assistive device: None Gait Pattern/deviations: Step-through pattern;Decreased stance time - right Gait velocity: 0.7 m/s Gait velocity interpretation: 1.31 - 2.62 ft/sec, indicative of limited community ambulator General Gait Details: descreased step with RLE but no instances of buckling. Pt able to navigate with mod perturbations, quick turns  Stairs Stairs: Yes Stairs assistance: Min guard Stair Management: No rails;One rail Right;Step to pattern;Forwards Number of Stairs: 5 (x3) General stair comments: x5 step up with LLE leading, x5 step up with RLE leading. able to step up with single UE support and good stability, challenged by step up without UE support  Wheelchair Mobility    Modified Rankin (Stroke Patients Only) Modified Rankin (Stroke Patients Only) Pre-Morbid Rankin Score: Slight disability Modified Rankin: Moderate disability     Balance Overall balance assessment: Mild deficits observed, not formally tested                                           Pertinent Vitals/Pain Pain Assessment: No/denies pain    Home Living Family/patient expects to be discharged to:: Private residence Living Arrangements: Children Available Help at Discharge: Family;Available PRN/intermittently (available 24/7 initially, then PRN) Type of Home: House Home Access: Stairs to enter Entrance Stairs-Rails: Right;Can reach both;Left Entrance Stairs-Number of Steps: 4 Home Layout: Two level;Able to live on main level with bedroom/bathroom Home Equipment:  None      Prior Function Level of Independence: Independent         Comments: Indep with ADLs, household and community mobilization; denies fall history.     Hand Dominance   Dominant Hand: Right    Extremity/Trunk Assessment   Upper Extremity Assessment Upper Extremity Assessment: Overall WFL for tasks  assessed    Lower Extremity Assessment Lower Extremity Assessment: RLE deficits/detail RLE Deficits / Details: grossly 4+/5, most significant difference at ankle DF and knee ext. pt reprots decreased sensation prior to original stroke. no new change RLE Sensation: decreased light touch    Cervical / Trunk Assessment Cervical / Trunk Assessment: Normal  Communication   Communication: No difficulties  Cognition Arousal/Alertness: Awake/alert Behavior During Therapy: WFL for tasks assessed/performed Overall Cognitive Status: Within Functional Limits for tasks assessed                                        General Comments General comments (skin integrity, edema, etc.): VSS on RA, daughter present and supportive    Exercises     Assessment/Plan    PT Assessment Patient needs continued PT services  PT Problem List Decreased strength;Decreased balance;Decreased mobility;Decreased coordination       PT Treatment Interventions Gait training;Stair training;Functional mobility training;Therapeutic activities;Therapeutic exercise;Balance training;Neuromuscular re-education;Patient/family education    PT Goals (Current goals can be found in the Care Plan section)  Acute Rehab PT Goals Patient Stated Goal: return home PT Goal Formulation: With patient Time For Goal Achievement: 05/05/20 Potential to Achieve Goals: Good    Frequency Min 3X/week    AM-PAC PT "6 Clicks" Mobility  Outcome Measure Help needed turning from your back to your side while in a flat bed without using bedrails?: None Help needed moving from lying on your back to sitting on the side of a flat bed without using bedrails?: None Help needed moving to and from a bed to a chair (including a wheelchair)?: A Little Help needed standing up from a chair using your arms (e.g., wheelchair or bedside chair)?: A Little Help needed to walk in hospital room?: A Little Help needed climbing 3-5 steps with a  railing? : A Little 6 Click Score: 20    End of Session Equipment Utilized During Treatment: Gait belt Activity Tolerance: Patient tolerated treatment well Patient left: in chair;with call bell/phone within reach;with family/visitor present Nurse Communication: Mobility status PT Visit Diagnosis: Other abnormalities of gait and mobility (R26.89);Unsteadiness on feet (R26.81)    Time: 0254-2706 PT Time Calculation (min) (ACUTE ONLY): 50 min   Charges:   PT Evaluation $PT Eval Low Complexity: 1 Low PT Treatments $Gait Training: 8-22 mins $Therapeutic Activity: 8-22 mins        Karma Ganja, PT, DPT   Acute Rehabilitation Department Pager #: [phone]  Otho Bellows 04/20/2020, 3:11 PM

## 2020-04-20 NOTE — Progress Notes (Addendum)
PROGRESS NOTE  Natalie [name] JJK:[id number] DOB: 04-03-1950 DOA: 04/19/2020 PCP: [name], MD   LOS: 1 day   Brief Narrative / Interim history: Natalie [name] is an 70 y.o. female with PMH significant for anxiety and depression and hypothyroidism who had a stroke 3 months ago in January 2022 when she presented with diplopia.  At that time the diplopia resolved and patient was placed on aspirin and atorvastatin.  Last week she had another episode of diplopia which lasted 1 to 2 minutes and resolve spontaneously.  The day prior to this admission patient developed difficulty speaking and thinking.  She also felt somewhat confused.  Apparently her symptoms wax and wane while in the ED. MRI showed patchy small volume acute ischemic nonhemorrhagic posterior left MCA distribution infarcts, possibly embolic shower.  CTA revealed a nearly occlusive thrombus of the left proximal M2 branch and subsequent occlusion of proximal left M3 branch.  Neurology was consulted and she was admitted to the hospital.  Subjective / 24h Interval events: Laying in bed, feels relatively well this morning without significant complaints.  No chest pain, no abdominal pain, no nausea or vomiting  Assessment & Plan: Principal Problem Acute left MCA infarcts with a nearly occlusive thrombus in the proximal M2 and M3 branches -Neurology following, appreciate input.  Currently she is on bedrest, IV fluids, continue Plavix, aspirin, statin -Work-up ongoing.  A1c 5.0.  LDL 45  Active Problems Thrombocytopenia -Patient has no history of thrombocytopenia.  In January 2022 with her prior stroke her platelets were mildly decreased at 108.  She continues to have thrombocytopenia slightly worse today at 67.  Possibly consumptive due to thrombosis but will evaluate further.  Fibrinogen is normal, PT PTT INR unremarkable, D-dimer elevated in the setting of thrombus.  Smear review pending but no schistocytes  preliminarily. -Daughter reports that patient has been having more bruising recently -She has no known history of liver disease.  Told me she drank a little bit more in her 25s but now she is not a drinker.  Once no longer at strict bedrest will screen for liver disease with an ultrasound.  LFTs are normal.  Hyperlipidemia -Continue statin, LDL very well controlled  Hypothyroidism -Continue Synthroid  Scheduled Meds: . aspirin  81 mg Oral Daily  . atorvastatin  40 mg Oral Daily  . buPROPion  150 mg Oral BID  . citalopram  40 mg Oral Daily  . clopidogrel  75 mg Oral Daily  . enoxaparin (LOVENOX) injection  40 mg Subcutaneous Q24H  . levothyroxine  75 mcg Oral QAC breakfast  . topiramate  25 mg Oral Daily   Continuous Infusions: . sodium chloride 75 mL/hr at 04/19/20 2240   PRN Meds:.acetaminophen **OR** acetaminophen (TYLENOL) oral liquid 160 mg/5 mL **OR** acetaminophen  Diet Orders (From admission, onward)    Start     Ordered   04/19/20 1913  Diet Heart Room service appropriate? Yes; Fluid consistency: Thin  Diet effective now       Question Answer Comment  Room service appropriate? Yes   Fluid consistency: Thin      04/19/20 1921          DVT prophylaxis: enoxaparin (LOVENOX) injection 40 mg Start: 04/19/20 2100     Code Status: Full Code  Family Communication: Daughter present at bedside  Status is: Observation  The patient will require care spanning > 2 midnights and should be moved to inpatient because: Inpatient level of care appropriate due to severity of  illness  Dispo: The patient is from: Home              Anticipated d/c is to: Home              Patient currently is not medically stable to d/c.   Difficult to place patient No  Level of care: Telemetry Medical  Consultants:  Neurology   Procedures:  2D echo: pending  Microbiology  none  Antimicrobials: none    Objective: Vitals:   04/20/20 0000 04/20/20 0200 04/20/20 0557 04/20/20 0808   BP: 130/67 (!) 124/95 140/69 (!) 143/78  Pulse: 61 60 60 (!) 57  Resp: 12 15  16   Temp: (!) 97.5 F (36.4 C) 97.9 F (36.6 C) 97.9 F (36.6 C) 98 F (36.7 C)  TempSrc: Oral Oral Oral Oral  SpO2: 97% 97% 98% 97%    Intake/Output Summary (Last 24 hours) at 04/20/2020 0902 Last data filed at 04/20/2020 0557 Gross per 24 hour  Intake -  Output 900 ml  Net -900 ml   There were no vitals filed for this visit.  Examination:  Constitutional: NAD Eyes: no scleral icterus ENMT: Mucous membranes are moist.  Neck: normal, supple Respiratory: clear to auscultation bilaterally, no wheezing, no crackles. Normal respiratory effort. No accessory muscle use.  Cardiovascular: Regular rate and rhythm, no murmurs / rubs / gallops. No LE edema. Good peripheral pulses Abdomen: non distended, no tenderness. Bowel sounds positive.  Musculoskeletal: no clubbing / cyanosis.  Skin: no rashes Neurologic: No apparent focal deficits  Data Reviewed: I have independently reviewed following labs and imaging studies   CBC: Recent Labs  Lab 04/19/20 1322 04/19/20 2025 04/20/20 0741  WBC 8.1 7.9  --   NEUTROABS 5.7  --   --   HGB 13.3 12.0  --   HCT 39.8 37.0  --   MCV 94.3 95.4  --   PLT 93* 77* 68*   Basic Metabolic Panel: Recent Labs  Lab 04/19/20 1322 04/19/20 2025  NA 139  --   K 3.7  --   CL 106  --   CO2 25  --   GLUCOSE 104*  --   BUN 23  --   CREATININE 0.88 0.75  CALCIUM 8.9  --    Liver Function Tests: Recent Labs  Lab 04/19/20 1322  AST 22  ALT 15  ALKPHOS 156*  BILITOT 0.7  PROT 7.0  ALBUMIN 3.7   Coagulation Profile: Recent Labs  Lab 04/19/20 1322 04/20/20 0741  INR 1.1 1.1   HbA1C: Recent Labs    04/20/20 0355  HGBA1C 5.0   CBG: No results for input(s): GLUCAP in the last 168 hours.  Recent Results (from the past 240 hour(s))  Resp Panel by RT-PCR (Flu A&B, Covid) Nasopharyngeal Swab     Status: None   Collection Time: 04/19/20  1:22 PM    Specimen: Nasopharyngeal Swab; Nasopharyngeal(NP) swabs in vial transport medium  Result Value Ref Range Status   SARS Coronavirus 2 by RT PCR NEGATIVE NEGATIVE Final    Comment: (NOTE) SARS-CoV-2 target nucleic acids are NOT DETECTED.  The SARS-CoV-2 RNA is generally detectable in upper respiratory specimens during the acute phase of infection. The lowest concentration of SARS-CoV-2 viral copies this assay can detect is 138 copies/mL. A negative result does not preclude SARS-Cov-2 infection and should not be used as the sole basis for treatment or other patient management decisions. A negative result may occur with  improper specimen collection/handling, submission of  specimen other than nasopharyngeal swab, presence of viral mutation(s) within the areas targeted by this assay, and inadequate number of viral copies(<138 copies/mL). A negative result must be combined with clinical observations, patient history, and epidemiological information. The expected result is Negative.  Fact Sheet for Patients:  EntrepreneurPulse.com.au  Fact Sheet for Healthcare Providers:  IncredibleEmployment.be  This test is no t yet approved or cleared by the [place] FDA and  has been authorized for detection and/or diagnosis of SARS-CoV-2 by FDA under an Emergency Use Authorization (EUA). This EUA will remain  in effect (meaning this test can be used) for the duration of the COVID-19 declaration under Section 564(b)(1) of the Act, 21 U.S.C.section 360bbb-3(b)(1), unless the authorization is terminated  or revoked sooner.       Influenza A by PCR NEGATIVE NEGATIVE Final   Influenza B by PCR NEGATIVE NEGATIVE Final    Comment: (NOTE) The Xpert Xpress SARS-CoV-2/FLU/RSV plus assay is intended as an aid in the diagnosis of influenza from Nasopharyngeal swab specimens and should not be used as a sole basis for treatment. Nasal washings and aspirates are  unacceptable for Xpert Xpress SARS-CoV-2/FLU/RSV testing.  Fact Sheet for Patients: EntrepreneurPulse.com.au  Fact Sheet for Healthcare Providers: IncredibleEmployment.be  This test is not yet approved or cleared by the [place] FDA and has been authorized for detection and/or diagnosis of SARS-CoV-2 by FDA under an Emergency Use Authorization (EUA). This EUA will remain in effect (meaning this test can be used) for the duration of the COVID-19 declaration under Section 564(b)(1) of the Act, 21 U.S.C. section 360bbb-3(b)(1), unless the authorization is terminated or revoked.  Performed at [facility], [address] [place], [place] [address]      Radiology Studies: CT [facility] HEAD NECK W WO CONTRAST  Result Date: 04/19/2020 CLINICAL DATA:  Code stroke follow-up EXAM: CT ANGIOGRAPHY HEAD AND NECK TECHNIQUE: Multidetector CT imaging of the head and neck was performed using the standard protocol during bolus administration of intravenous contrast. Multiplanar CT image reconstructions and MIPs were obtained to evaluate the vascular anatomy. Carotid stenosis measurements (when applicable) are obtained utilizing NASCET criteria, using the distal internal carotid diameter as the denominator. CONTRAST:  27mL OMNIPAQUE IOHEXOL 350 MG/ML SOLN COMPARISON:  02/29/2020 FINDINGS: CTA NECK Aortic arch: Great vessel origins are patent. Right carotid system: Patent.  No stenosis at the ICA origin. Left carotid system: Patent.  No stenosis at the ICA origin. Vertebral arteries: Patent. Right vertebral artery is dominant. No stenosis. Skeleton: Stable degenerative changes of the cervical spine. Other neck: No new finding. Upper chest: No new finding. Review of the MIP images confirms the above findings CTA HEAD Anterior circulation: Intracranial internal carotid arteries are patent. Left M1 MCA is patent. There is nearly occlusive thrombus within a  proximal M2 branch approximately 6 mm from the bifurcation. Subsequent occlusion of a proximal left M3 branch. Right middle and both anterior cerebral arteries are patent. Posterior circulation: Intracranial internal carotid arteries are patent. Basilar artery is patent. Major cerebellar artery origins are patent. Posterior cerebral arteries are patent. Left posterior communicating artery is present. Venous sinuses: Patent as allowed by contrast bolus timing. Review of the MIP images confirms the above findings IMPRESSION: Nearly occlusive thrombus at the bifurcation of a proximal M2 MCA branch approximately 6 mm from the MCA bifurcation. Subsequent occlusion of a proximal left M3 MCA branch. No stenosis in the neck. These results were called by telephone at the time of interpretation on 04/19/2020 at  2:01 pm to provider Lynnae Sandhoff , who verbally acknowledged these results. Electronically Signed   By: Macy Mis M.D.   On: 04/19/2020 14:10   MR BRAIN WO CONTRAST  Result Date: 04/19/2020 CLINICAL DATA:  Initial evaluation for acute stroke. EXAM: MRI HEAD WITHOUT CONTRAST TECHNIQUE: Multiplanar, multiecho pulse sequences of the brain and surrounding structures were obtained without intravenous contrast. COMPARISON:  Prior CTs from earlier the same day as well as previous MRI from 02/28/2020. FINDINGS: Brain: Generalized age appropriate cerebral volume. Mild chronic microvascular ischemic disease again noted. Patchy small volume foci of restricted diffusion seen involving the cortical and subcortical aspect of the posterior left frontoparietal and temporal region, consistent with posterior left MCA distribution infarcts. For reference purposes, the largest of these foci seen at the posterior left temporal region and measures 8 mm. Findings are embolic in distribution. No associated hemorrhage or mass effect. Additional apparent subcentimeter focus of mild diffusion abnormality at the posterior right  frontoparietal centrum semi ovale favored to reflect T2 shine through (series 2, image 34). No other evidence for acute or subacute ischemia. Gray-white matter differentiation otherwise maintained. No acute intracranial hemorrhage. Small focus of susceptibility artifact at the posterior right temporal region noted, consistent with a small chronic microhemorrhage. Additional minimal residual chronic blood products noted about the occipital regions related to recently identified ischemic infarcts. No mass lesion, midline shift or mass effect. No hydrocephalus or extra-axial fluid collection. Pituitary gland suprasellar region within normal limits. Midline structures intact. Vascular: Hypoplastic left vertebral artery not well seen. Major intracranial vascular flow voids are otherwise maintained. Skull and upper cervical spine: Craniocervical junction within normal limits. Bone marrow signal intensity normal. No focal marrow replacing lesion. No scalp soft tissue abnormality. Sinuses/Orbits: Patient status post bilateral ocular lens replacement. Globes and orbital soft tissues demonstrate no acute finding. Paranasal sinuses are largely clear. No significant mastoid effusion. Inner ear structures grossly normal. Other: None. IMPRESSION: 1. Patchy small volume acute ischemic nonhemorrhagic posterior left MCA distribution infarcts, likely reflecting an embolic shower. No associated hemorrhage or mass effect. 2. No other acute intracranial abnormality. 3. Underlying mild chronic microvascular ischemic disease. Electronically Signed   By: Jeannine Boga M.D.   On: 04/19/2020 23:39   CT HEAD CODE STROKE WO CONTRAST  Addendum Date: 04/19/2020   ADDENDUM REPORT: 04/19/2020 13:56 ADDENDUM: Under ASPECT score calculation there is a dictation error. Should state 7 rather than 6. Electronically Signed   By: Macy Mis M.D.   On: 04/19/2020 13:56   Result Date: 04/19/2020 CLINICAL DATA:  Code stroke. EXAM: CT HEAD  WITHOUT CONTRAST TECHNIQUE: Contiguous axial images were obtained from the base of the skull through the vertex without intravenous contrast. COMPARISON:  None. FINDINGS: Brain: No acute intracranial hemorrhage, mass effect, or edema. No definite new loss of gray-white differentiation. Ventricles and sulci are normal in size and configuration. No extra-axial collection. Vascular: Focal density along a left M2 MCA branch within the sylvian fissure. Skull: Unremarkable. Sinuses/Orbits: No acute finding. Other: Mastoid air cells are clear. ASPECTS [facility] Stroke Program Early CT Score) - Ganglionic level infarction (caudate, lentiform nuclei, internal capsule, insula, M1-M3 cortex): 6 - Supraganglionic infarction (M4-M6 cortex): 3 Total score (0-10 with 10 being normal): 9 IMPRESSION: No acute intracranial hemorrhage.  No definite acute infarction. Possible dense left M2 MCA branch within the sylvian fissure. CTA pending. These results were called by telephone at the time of interpretation on 04/19/2020 at 1:42 pm to provider Lavonia Drafts , who verbally  acknowledged these results. Electronically Signed: By: Macy Mis M.D. On: 04/19/2020 13:46   Marzetta Board, MD, PhD [facility]  Between 7 am - 7 pm I am available, please contact me via Amion or Securechat  Between 7 pm - 7 am I am not available, please contact night coverage MD/APP via Amion

## 2020-04-21 ENCOUNTER — Inpatient Hospital Stay (HOSPITAL_COMMUNITY): Payer: Medicare Other

## 2020-04-21 DIAGNOSIS — I63412 Cerebral infarction due to embolism of left middle cerebral artery: Secondary | ICD-10-CM

## 2020-04-21 DIAGNOSIS — I639 Cerebral infarction, unspecified: Secondary | ICD-10-CM | POA: Diagnosis not present

## 2020-04-21 LAB — CBC
HCT: 35.1 % — ABNORMAL LOW (ref 36.0–46.0)
Hemoglobin: 11.5 g/dL — ABNORMAL LOW (ref 12.0–15.0)
MCH: 31.2 pg (ref 26.0–34.0)
MCHC: 32.8 g/dL (ref 30.0–36.0)
MCV: 95.1 fL (ref 80.0–100.0)
Platelets: 77 10*3/uL — ABNORMAL LOW (ref 150–400)
RBC: 3.69 MIL/uL — ABNORMAL LOW (ref 3.87–5.11)
RDW: 12.2 % (ref 11.5–15.5)
WBC: 7.1 10*3/uL (ref 4.0–10.5)
nRBC: 0 % (ref 0.0–0.2)

## 2020-04-21 LAB — COMPREHENSIVE METABOLIC PANEL
ALT: 14 U/L (ref 0–44)
AST: 18 U/L (ref 15–41)
Albumin: 3.1 g/dL — ABNORMAL LOW (ref 3.5–5.0)
Alkaline Phosphatase: 138 U/L — ABNORMAL HIGH (ref 38–126)
Anion gap: 6 (ref 5–15)
BUN: 11 mg/dL (ref 8–23)
CO2: 25 mmol/L (ref 22–32)
Calcium: 8.6 mg/dL — ABNORMAL LOW (ref 8.9–10.3)
Chloride: 108 mmol/L (ref 98–111)
Creatinine, Ser: 0.87 mg/dL (ref 0.44–1.00)
GFR, Estimated: >60 mL/min (ref >60)
Glucose, Bld: 90 mg/dL (ref 70–99)
Potassium: 3.3 mmol/L — ABNORMAL LOW (ref 3.5–5.1)
Sodium: 139 mmol/L (ref 135–145)
Total Bilirubin: 0.8 mg/dL (ref 0.3–1.2)
Total Protein: 5.9 g/dL — ABNORMAL LOW (ref 6.5–8.1)

## 2020-04-21 LAB — PROTIME-INR
INR: 1.1 (ref 0.8–1.2)
Prothrombin Time: 14.1 seconds (ref 11.4–15.2)

## 2020-04-21 LAB — APTT: aPTT: 32 seconds (ref 24–36)

## 2020-04-21 LAB — PHOSPHORUS: Phosphorus: 3 mg/dL (ref 2.5–4.6)

## 2020-04-21 LAB — MAGNESIUM: Magnesium: 2 mg/dL (ref 1.7–2.4)

## 2020-04-21 MED ORDER — POTASSIUM CHLORIDE CRYS ER 20 MEQ PO TBCR
40.0000 meq | EXTENDED_RELEASE_TABLET | Freq: Once | ORAL | Status: AC
  Administered 2020-04-21: 40 meq via ORAL
  Filled 2020-04-21: qty 2

## 2020-04-21 MED ORDER — STROKE: EARLY STAGES OF RECOVERY BOOK
Status: AC
  Filled 2020-04-21: qty 1

## 2020-04-21 NOTE — Progress Notes (Signed)
VASCULAR LAB    Bilateral lower extremity venous duplex has been performed.  See CV proc for preliminary results.   Antionio Negron, RVT 04/21/2020, 3:05 PM

## 2020-04-21 NOTE — Progress Notes (Signed)
Physical Therapy Treatment Patient Details Name: Natalie [name] MRN: [id number] DOB: 16-Aug-1950 Today's Date: 04/21/2020    History of Present Illness The  pt is a 70 yo female presenting 3/17 with confusion and difficulty word-finding. Imaging revealed L MCA infarcts with nearly occlusive thrombus in proximal M2 and M3. Plan for TEE and loop recorder replacement 3/21 at 2pm. PMH includes: CVA in Jan 2022, anxiety, hypothyroidism.    PT Comments    Pt received in supine, friend present in room and supportive, pt agreeable to therapy session and making good progress toward functional mobility goals. Pt performed greater than household distance gait trial with Supervision using cane, pt impulsive at times with cane use (tending to joke around) and needs min safety cues but no loss of balance and vitals signs stable on room air. Pt continues to benefit from PT services to progress toward functional mobility goals. Continue to recommend OPPT, will continue to assess DME next session pending progress.   Follow Up Recommendations  Outpatient PT;Supervision for mobility/OOB     Equipment Recommendations   (tub bench, maybe cane? will re-assess post-procedure)    Recommendations for Other Services       Precautions / Restrictions Precautions Precautions: Fall Restrictions Weight Bearing Restrictions: No    Mobility  Bed Mobility Overal bed mobility: Independent                  Transfers Overall transfer level: Needs assistance Equipment used: None Transfers: Sit to/from Stand Sit to Stand: Supervision         General transfer comment: supervision for safety. no assist needed pt able to stand without cues or UE support, no LOB; from EOB and to/from toilet heights  Ambulation/Gait Ambulation/Gait assistance: Supervision Gait Distance (Feet): 400 Feet Assistive device: Straight cane Gait Pattern/deviations: Step-through pattern;Decreased stance time - right      General Gait Details: no LOB, pt needs some reminders to use cane at times but she reports she finds it helpful; HR max 106 bpm, 70-80's resting; SpO2 WNL on RA   Stairs             Wheelchair Mobility    Modified Rankin (Stroke Patients Only)       Balance Overall balance assessment: Mild deficits observed, not formally tested         Cognition Arousal/Alertness: Awake/alert Behavior During Therapy: WFL for tasks assessed/performed;Impulsive Overall Cognitive Status: Within Functional Limits for tasks assessed          General Comments: at times impulsive with cane and joking gesturing with it and at times keeps it lifted up and internally distracted/needs some reorientation to task (unsure of her baseline demeanor), some anxiety but agreeable.      Exercises      General Comments General comments (skin integrity, edema, etc.): see gait comment for vitals; female friend present during portion of session and supportive      Pertinent Vitals/Pain Pain Assessment: No/denies pain    Home Living   Prior Function    PT Goals (current goals can now be found in the care plan section) Acute Rehab PT Goals Patient Stated Goal: return home PT Goal Formulation: With patient Time For Goal Achievement: 05/05/20 Potential to Achieve Goals: Good Progress towards PT goals: Progressing toward goals    Frequency    Min 3X/week      PT Plan Current plan remains appropriate    AM-PAC PT "6 Clicks" Mobility   Outcome Measure  Help needed  turning from your back to your side while in a flat bed without using bedrails?: None Help needed moving from lying on your back to sitting on the side of a flat bed without using bedrails?: None Help needed moving to and from a bed to a chair (including a wheelchair)?: A Little Help needed standing up from a chair using your arms (e.g., wheelchair or bedside chair)?: A Little Help needed to walk in hospital room?: A Little Help  needed climbing 3-5 steps with a railing? : A Little 6 Click Score: 20    End of Session Equipment Utilized During Treatment: Gait belt Activity Tolerance: Patient tolerated treatment well Patient left: with call bell/phone within reach;in bed (bed in chair position) Nurse Communication: Mobility status PT Visit Diagnosis: Other abnormalities of gait and mobility (R26.89);Unsteadiness on feet (R26.81)     Time: 2174-7159 PT Time Calculation (min) (ACUTE ONLY): 36 min  Charges:  $Gait Training: 23-37 mins                     Dody Smartt P., PTA Acute Rehabilitation Services Pager: [phone] Office: [place] 04/21/2020, 2:49 PM

## 2020-04-21 NOTE — Progress Notes (Addendum)
STROKE TEAM PROGRESS NOTE   INTERVAL HISTORY No acute events overnight.  She reports that she is doing well today. Has no com[plaints at present. Discussed that given the high suspicion for Cardiac source of embolisms she will have a TEE and Loop recorder placement on Monday. She was in agreement with this. Neuro exam is unchanged from yesterday.  Hospitalist Service is Primary. Cardiology is following for TEE and Loop recorder placement scheduled for Monday.  Vitals:   04/20/20 1627 04/20/20 1941 04/20/20 2309 04/21/20 0412  BP: 130/77 138/71 (!) 143/65 133/75  Pulse: 67 66 64 (!) 58  Resp: 16 18 (!) 21 16  Temp: 97.8 F (36.6 C) 98.3 F (36.8 C) 98.2 F (36.8 C) 97.7 F (36.5 C)  TempSrc: Oral Oral Oral Oral  SpO2: 94% 95% 100% 98%   CBC:  Recent Labs  Lab 04/19/20 1322 04/19/20 2025 04/20/20 0741 04/21/20 0100  WBC 8.1 7.9  --  7.1  NEUTROABS 5.7  --   --   --   HGB 13.3 12.0  --  11.5*  HCT 39.8 37.0  --  35.1*  MCV 94.3 95.4  --  95.1  PLT 93* 77* 68* 77*   Basic Metabolic Panel:  Recent Labs  Lab 04/19/20 1322 04/19/20 2025 04/21/20 0100  NA 139  --  139  K 3.7  --  3.3*  CL 106  --  108  CO2 25  --  25  GLUCOSE 104*  --  90  BUN 23  --  11  CREATININE 0.88 0.75 0.87  CALCIUM 8.9  --  8.6*  MG  --   --  2.0  PHOS  --   --  3.0   Lipid Panel:  Recent Labs  Lab 04/20/20 0355  CHOL 87  TRIG 54  HDL 31*  CHOLHDL 2.8  VLDL 11  LDLCALC 45   HgbA1c:  Recent Labs  Lab 04/20/20 0355  HGBA1C 5.0   Urine Drug Screen:  Recent Labs  Lab 04/19/20 1322  LABOPIA NONE DETECTED  COCAINSCRNUR NONE DETECTED  LABBENZ NONE DETECTED  AMPHETMU NONE DETECTED  THCU NONE DETECTED  LABBARB NONE DETECTED    Alcohol Level  Recent Labs  Lab 04/19/20 1322  ETH <10    IMAGING past 24 hours US Abdomen Limited RUQ (LIVER/GB)  Result Date: 04/20/2020 CLINICAL DATA:  Thrombocytopenia.  Inpatient. EXAM: ULTRASOUND ABDOMEN LIMITED RIGHT UPPER QUADRANT  COMPARISON:  None. FINDINGS: Gallbladder: Numerous layering shadowing gallstones in the gallbladder, largest 1.4 cm. No gallbladder wall thickening. No pericholecystic fluid. No sonographic Murphy's sign. Common bile duct: Diameter: 6 mm Liver: Top-normal liver parenchymal echogenicity. No liver masses. Portal vein is patent on color Doppler imaging with normal direction of blood flow towards the liver. Other: None. IMPRESSION: 1. Cholelithiasis.  No evidence of acute cholecystitis. 2. Top normal caliber common bile duct (6 mm diameter). Suggest correlation with serum bilirubin levels. MRI abdomen with MRCP may be considered as clinically warranted. 3. Normal liver. Electronically Signed   By: Ilona Sorrel M.D.   On: 04/20/2020 13:21    PHYSICAL EXAM Blood pressure 133/75, pulse (!) 58, temperature 97.7 F (36.5 C), temperature source Oral, resp. rate 16, SpO2 98 %.  General: alert and awake, elderly caucasian female, no apparent distress  Lungs: Symmetrical Chest rise, no labored breathing  Cardio: Regular Rate and Rhythm  Abdomen: Soft, non-tender  Neuro: Alert, oriented, thought content appropriate.  Speech fluent without evidence of aphasia.  Able to follow  all commands without difficulty. Cranial Nerves: II:  Visual fields grossly normal, pupils equal, round, reactive to light and accommodation III,IV, VI: ptosis not present, extra-ocular motions intact bilaterally V,VII: smile symmetric, facial light touch sensation normal bilaterally VIII: hearing normal bilaterally IX,X: uvula rises symmetrically XI: bilateral shoulder shrug XII: midline tongue extension without atrophy or fasciculations  Motor: Right :  Upper extremity   5/5                                      Left:     Upper extremity   5/5             Lower extremity   5/5                                                  Lower extremity   5/5 Tone and bulk:normal tone throughout; no atrophy noted Sensory: light touch intact  throughout, bilaterally Cerebellar: normal finger-to-nose Gait: deferred    ASSESSMENT/PLAN Ms. RITAL CAVEY is a 70 y.o. female with history of anxiety and depression and hypothyroidism who had a stroke 3 months ago in January 2022 when she presented with diplopia presenting with difficulty speaking. She describes the event aswalking into kitchenwith acupin her hand and suddenlydropped it.Shestared atcupwhilethinking "...did not know what to do"which is out of character for her. On arrival to ED she had difficulty speaking however symptoms have since completely resolved. She reports her symptoms have gradually improved although she still has some difficulty speaking. She denies extremity weakness.  Her platelet count is increasing will continue to monitor. Will not switch from Plavix to Brilinta until Monday due to platelet count..  She was seen by Cardiology yesterday and will be scheduled for TEE and loop recorder placement on Monday.  Acute left MCA infarcts with a nearly occlusive thrombus in the proximal M2 and M3 branches most likely embolus from cardiac Origin  Code Stroke CT head - No acute intracranial hemorrhage. No definite acute infarction. Possible dense left M2 MCA branch within the sylvian fissure. CTA pending.  CTA head and neck - Nearly occlusive thrombus at the bifurcation of a proximal M2 MCA branch approximately 6 mm from the MCA bifurcation. Subsequent occlusion of a proximal left M3 MCA branch. No stenosis in the neck.  MRI  - Patchy small volume acute ischemic nonhemorrhagic posterior left MCA distribution infarcts, likely reflecting an embolic shower. No associated hemorrhage or mass effect. No other acute intracranial abnormality. Underlying mild chronic microvascular ischemic disease.  LDL 45  HgbA1c 5.0  VTE prophylaxis - Lovenox         Diet     Diet Heart Room service appropriate? Yes; Fluid consistency: Thin         aspirin 81  mg daily prior to admission, now on aspirin 81 mg daily and Brilinta (ticagrelor) 90 mg bid for 30 days then will discontinue both and start Plavix.  Therapy recommendations:  Pending  Disposition:  Pending Work up  Hypertension  Home meds:  None  Stable  Permissive hypertension (OK if < 220/120) but gradually normalize in 5-7 days  Long-term BP goal normotensive  Hyperlipidemia  Home meds:  Lipitor, resumed in hospital  LDL 45, goal < 70  Currently at target  will not make any changes   High intensity statin Lipitor 40 mg daily  Continue statin at discharge   Other Stroke Risk Factors  Advanced Age >/= 63   Former Cigarette smoker  Obesity, There is no height or weight on file to calculate BMI., BMI >/= 30 associated with increased stroke risk, recommend weight loss, diet and exercise as appropriate   Hx stroke (January 2022)  Migraines   Other Active Problems  Thrombocytopenia - LFT's normal, undergoing [place] of liver  Hypothyroidism - Synthroid 75 mcg daily   Hospital day # [facility] MD Resident  ATTENDING NOTE: I reviewed above note and agree with the assessment and plan. Pt was seen and examined.   70 year old female with history of depression, anxiety stroke admitted for speech difficulty, right hand weakness.  Currently symptom has resolved.  Patient had a stroke in 02/2020 with diplopia and dizziness.  MRI showed a bilateral subacute scattered cortical subcortical infarct in the right occipital infarct.  EF 60 to 65%.  CT head and neck negative.  Put on DAPT and Lipitor 40.  Had 30-day CardioNet monitoring as outpatient, reported negative so far.  No family at bedside today on exam.  Patient neurologically intact, no focal deficit.  This admission CT negative, CT head and neck showed left M2/M3 occlusion.  MRI showed left MCA patchy infarct with embolic shower pattern.  LDL 45, A1c 5.0.  DVT negative.  Thrombocytopenia improving 68-> 77.   Patient currently on aspirin 81 and Plavix 75 as well as Lipitor 40.  Dr. Leonie Man recommended aspirin and the Brilinta for 30 days and then Plavix alone.  Will follow platelet level, once platelet more than 100, will start aspirin in the lingula.  Recommend TEE and loop recorder on Monday.   [name], MD PhD Stroke Neurology 04/21/2020 11:46 PM     To contact Stroke Continuity provider, please refer to [email or web address] After hours, contact General Neurology

## 2020-04-21 NOTE — Plan of Care (Signed)
  Problem: Education: Goal: Knowledge of General Education information will improve Description: Including pain rating scale, medication(s)/side effects and non-pharmacologic comfort measures Outcome: Progressing   Problem: Health Behavior/Discharge Planning: Goal: Ability to manage health-related needs will improve Outcome: Progressing   Problem: Clinical Measurements: Goal: Ability to maintain clinical measurements within normal limits will improve Outcome: Progressing Goal: Will remain free from infection Outcome: Progressing Goal: Diagnostic test results will improve Outcome: Progressing Goal: Respiratory complications will improve Outcome: Progressing Goal: Cardiovascular complication will be avoided Outcome: Progressing   Problem: Activity: Goal: Risk for activity intolerance will decrease Outcome: Progressing   Problem: Nutrition: Goal: Adequate nutrition will be maintained Outcome: Progressing   Problem: Coping: Goal: Level of anxiety will decrease Outcome: Progressing   Problem: Elimination: Goal: Will not experience complications related to bowel motility Outcome: Progressing Goal: Will not experience complications related to urinary retention Outcome: Progressing   Problem: Pain Managment: Goal: General experience of comfort will improve Outcome: Progressing   Problem: Safety: Goal: Ability to remain free from injury will improve Outcome: Progressing   Problem: Skin Integrity: Goal: Risk for impaired skin integrity will decrease Outcome: Progressing   Problem: Education: Goal: Knowledge of disease or condition will improve Outcome: Progressing Goal: Knowledge of secondary prevention will improve Outcome: Progressing Goal: Knowledge of patient specific risk factors addressed and post discharge goals established will improve Outcome: Progressing Goal: Individualized Educational Video(s) Outcome: Progressing   Problem: Coping: Goal: Will verbalize  positive feelings about self Outcome: Progressing   Problem: Ischemic Stroke/TIA Tissue Perfusion: Goal: Complications of ischemic stroke/TIA will be minimized Outcome: Progressing

## 2020-04-21 NOTE — Progress Notes (Signed)
PROGRESS NOTE  Natalie [name] PYK:[id number] DOB: 10-07-1950 DOA: 04/19/2020 PCP: [name], MD   LOS: 2 days   Brief Narrative / Interim history: Natalie [name] is an 70 y.o. female with PMH significant for anxiety and depression and hypothyroidism who had a stroke 3 months ago in January 2022 when she presented with diplopia.  At that time the diplopia resolved and patient was placed on aspirin and atorvastatin.  Last week she had another episode of diplopia which lasted 1 to 2 minutes and resolve spontaneously.  The day prior to this admission patient developed difficulty speaking and thinking.  She also felt somewhat confused.  Apparently her symptoms wax and wane while in the ED. MRI showed patchy small volume acute ischemic nonhemorrhagic posterior left MCA distribution infarcts, possibly embolic shower.  CTA revealed a nearly occlusive thrombus of the left proximal M2 branch and subsequent occlusion of proximal left M3 branch.  Neurology was consulted and she was admitted to the hospital.  Subjective / 24h Interval events: Laying in bed, feels relatively well this morning without significant complaints.  No chest pain, no abdominal pain, no nausea or vomiting  Assessment & Plan: Principal Problem Acute left MCA infarcts with a nearly occlusive thrombus in the proximal M2 and M3 branches -Neurology consulted and following.  Discussed with Dr. Erlinda Hong today.  Continue aspirin, once platelets are getting better we will add Brilinta.  Lipid panel showed an LDL of 108.  She is on statin.  A1c 5.0.  She is scheduled to get a TEE as well as a loop recorder on Monday  Active Problems Thrombocytopenia -Patient has no history of thrombocytopenia.  In January 2022 with her prior stroke her platelets were mildly decreased at 108.  Her platelets decreased on admission and are down to 68 yesterday, however improving today.  Possibly consumptive thrombosis, no schistocytes on smear or any other  apparent causes.  She has no liver disease, right upper quadrant ultrasound only showed cholelithiasis as incidental finding.  Hyperlipidemia -Continue statin  Hypothyroidism -Continue Synthroid  Scheduled Meds: . aspirin  81 mg Oral Daily  . atorvastatin  40 mg Oral Daily  . buPROPion  150 mg Oral BID  . citalopram  20 mg Oral Daily  . clopidogrel  75 mg Oral Daily  . enoxaparin (LOVENOX) injection  40 mg Subcutaneous Q24H  . levothyroxine  75 mcg Oral QAC breakfast  . topiramate  25 mg Oral Daily   Continuous Infusions:  PRN Meds:.acetaminophen **OR** acetaminophen (TYLENOL) oral liquid 160 mg/5 mL **OR** acetaminophen  Diet Orders (From admission, onward)    Start     Ordered   04/23/20 0001  Diet NPO time specified Except for: Sips with Meds  Diet effective ____       Comments: Patient to remain NPO until fully awake following the TEE.  Question:  Except for  Answer:  Ferrel Logan with Meds   04/20/20 1751   04/19/20 1913  Diet Heart Room service appropriate? Yes; Fluid consistency: Thin  Diet effective now       Question Answer Comment  Room service appropriate? Yes   Fluid consistency: Thin      04/19/20 1921          DVT prophylaxis: enoxaparin (LOVENOX) injection 40 mg Start: 04/19/20 2100     Code Status: Full Code  Family Communication: Daughter present at bedside  Status is: Inpatient  inpatient because: Inpatient level of care appropriate due to severity of illness  Dispo:  The patient is from: Home              Anticipated d/c is to: Home              Patient currently is not medically stable to d/c.   Difficult to place patient No  Level of care: Telemetry Medical  Consultants:  Neurology   Procedures:  2D echo: pending  Microbiology  none  Antimicrobials: none    Objective: Vitals:   04/20/20 2309 04/21/20 0412 04/21/20 0800 04/21/20 1200  BP: (!) 143/65 133/75 (!) 147/70 (!) 143/77  Pulse: 64 (!) 58  60  Resp: (!) 21 16 18  (!) 21   Temp: 98.2 F (36.8 C) 97.7 F (36.5 C) (!) 97.5 F (36.4 C) 98 F (36.7 C)  TempSrc: Oral Oral Oral Oral  SpO2: 100% 98%  98%    Intake/Output Summary (Last 24 hours) at 04/21/2020 1347 Last data filed at 04/21/2020 0330 Gross per 24 hour  Intake 2685.71 ml  Output --  Net 2685.71 ml   There were no vitals filed for this visit.  Examination:  Constitutional: She is in no distress Eyes: No scleral icterus ENMT: mmm Neck: normal, supple Respiratory: Lungs are clear bilaterally, no wheezing, no crackles.  Normal respiratory effort Cardiovascular: Regular rate and rhythm, no murmurs appreciated.  No peripheral edema Abdomen: Abdomen is soft, nontender, nondistended, bowel sounds are positive Musculoskeletal: no clubbing / cyanosis.  Skin: No new rashes Neurologic: Nonfocal, strength is equal  Data Reviewed: I have independently reviewed following labs and imaging studies   CBC: Recent Labs  Lab 04/19/20 1322 04/19/20 2025 04/20/20 0741 04/21/20 0100  WBC 8.1 7.9  --  7.1  NEUTROABS 5.7  --   --   --   HGB 13.3 12.0  --  11.5*  HCT 39.8 37.0  --  35.1*  MCV 94.3 95.4  --  95.1  PLT 93* 77* 68* 77*   Basic Metabolic Panel: Recent Labs  Lab 04/19/20 1322 04/19/20 2025 04/21/20 0100  NA 139  --  139  K 3.7  --  3.3*  CL 106  --  108  CO2 25  --  25  GLUCOSE 104*  --  90  BUN 23  --  11  CREATININE 0.88 0.75 0.87  CALCIUM 8.9  --  8.6*  MG  --   --  2.0  PHOS  --   --  3.0   Liver Function Tests: Recent Labs  Lab 04/19/20 1322 04/21/20 0100  AST 22 18  ALT 15 14  ALKPHOS 156* 138*  BILITOT 0.7 0.8  PROT 7.0 5.9*  ALBUMIN 3.7 3.1*   Coagulation Profile: Recent Labs  Lab 04/19/20 1322 04/20/20 0741 04/21/20 0100  INR 1.1 1.1 1.1   HbA1C: Recent Labs    04/20/20 0355  HGBA1C 5.0   CBG: No results for input(s): GLUCAP in the last 168 hours.  Recent Results (from the past 240 hour(s))  Resp Panel by RT-PCR (Flu A&B, Covid)  Nasopharyngeal Swab     Status: None   Collection Time: 04/19/20  1:22 PM   Specimen: Nasopharyngeal Swab; Nasopharyngeal(NP) swabs in vial transport medium  Result Value Ref Range Status   SARS Coronavirus 2 by RT PCR NEGATIVE NEGATIVE Final    Comment: (NOTE) SARS-CoV-2 target nucleic acids are NOT DETECTED.  The SARS-CoV-2 RNA is generally detectable in upper respiratory specimens during the acute phase of infection. The lowest concentration of SARS-CoV-2 viral copies this  assay can detect is 138 copies/mL. A negative result does not preclude SARS-Cov-2 infection and should not be used as the sole basis for treatment or other patient management decisions. A negative result may occur with  improper specimen collection/handling, submission of specimen other than nasopharyngeal swab, presence of viral mutation(s) within the areas targeted by this assay, and inadequate number of viral copies(<138 copies/mL). A negative result must be combined with clinical observations, patient history, and epidemiological information. The expected result is Negative.  Fact Sheet for Patients:  EntrepreneurPulse.com.au  Fact Sheet for Healthcare Providers:  IncredibleEmployment.be  This test is no t yet approved or cleared by the [place] FDA and  has been authorized for detection and/or diagnosis of SARS-CoV-2 by FDA under an Emergency Use Authorization (EUA). This EUA will remain  in effect (meaning this test can be used) for the duration of the COVID-19 declaration under Section 564(b)(1) of the Act, 21 U.S.C.section 360bbb-3(b)(1), unless the authorization is terminated  or revoked sooner.       Influenza A by PCR NEGATIVE NEGATIVE Final   Influenza B by PCR NEGATIVE NEGATIVE Final    Comment: (NOTE) The Xpert Xpress SARS-CoV-2/FLU/RSV plus assay is intended as an aid in the diagnosis of influenza from Nasopharyngeal swab specimens and should not be  used as a sole basis for treatment. Nasal washings and aspirates are unacceptable for Xpert Xpress SARS-CoV-2/FLU/RSV testing.  Fact Sheet for Patients: EntrepreneurPulse.com.au  Fact Sheet for Healthcare Providers: IncredibleEmployment.be  This test is not yet approved or cleared by the [place] FDA and has been authorized for detection and/or diagnosis of SARS-CoV-2 by FDA under an Emergency Use Authorization (EUA). This EUA will remain in effect (meaning this test can be used) for the duration of the COVID-19 declaration under Section 564(b)(1) of the Act, 21 U.S.C. section 360bbb-3(b)(1), unless the authorization is terminated or revoked.  Performed at [facility], [address] [place], [place] [address]      Radiology Studies: No results found. Marzetta Board, MD, PhD [facility]  Between 7 am - 7 pm I am available, please contact me via Amion or Securechat  Between 7 pm - 7 am I am not available, please contact night coverage MD/APP via Amion

## 2020-04-22 DIAGNOSIS — Z8673 Personal history of transient ischemic attack (TIA), and cerebral infarction without residual deficits: Secondary | ICD-10-CM

## 2020-04-22 DIAGNOSIS — I639 Cerebral infarction, unspecified: Secondary | ICD-10-CM

## 2020-04-22 LAB — CBC
HCT: 35.3 % — ABNORMAL LOW (ref 36.0–46.0)
Hemoglobin: 11.6 g/dL — ABNORMAL LOW (ref 12.0–15.0)
MCH: 31.2 pg (ref 26.0–34.0)
MCHC: 32.9 g/dL (ref 30.0–36.0)
MCV: 94.9 fL (ref 80.0–100.0)
Platelets: 66 10*3/uL — ABNORMAL LOW (ref 150–400)
RBC: 3.72 MIL/uL — ABNORMAL LOW (ref 3.87–5.11)
RDW: 12.1 % (ref 11.5–15.5)
WBC: 7.6 10*3/uL (ref 4.0–10.5)
nRBC: 0 % (ref 0.0–0.2)

## 2020-04-22 LAB — BASIC METABOLIC PANEL
Anion gap: 9 (ref 5–15)
BUN: 12 mg/dL (ref 8–23)
CO2: 21 mmol/L — ABNORMAL LOW (ref 22–32)
Calcium: 8.7 mg/dL — ABNORMAL LOW (ref 8.9–10.3)
Chloride: 107 mmol/L (ref 98–111)
Creatinine, Ser: 0.91 mg/dL (ref 0.44–1.00)
GFR, Estimated: >60 mL/min (ref >60)
Glucose, Bld: 90 mg/dL (ref 70–99)
Potassium: 3.7 mmol/L (ref 3.5–5.1)
Sodium: 137 mmol/L (ref 135–145)

## 2020-04-22 LAB — RETICULOCYTES
Immature Retic Fract: 11.5 % (ref 2.3–15.9)
RBC.: 3.85 MIL/uL — ABNORMAL LOW (ref 3.87–5.11)
Retic Count, Absolute: 53.9 10*3/uL (ref 19.0–186.0)
Retic Ct Pct: 1.4 % (ref 0.4–3.1)

## 2020-04-22 LAB — VITAMIN B12: Vitamin B-12: 831 pg/mL (ref 180–914)

## 2020-04-22 LAB — IRON AND TIBC
Iron: 45 ug/dL (ref 28–170)
Saturation Ratios: 17 % (ref 10.4–31.8)
TIBC: 265 ug/dL (ref 250–450)
UIBC: 220 ug/dL

## 2020-04-22 LAB — FOLATE: Folate: 27.9 ng/mL (ref >5.9)

## 2020-04-22 LAB — FERRITIN: Ferritin: 103 ng/mL (ref 11–307)

## 2020-04-22 NOTE — Progress Notes (Signed)
STROKE TEAM PROGRESS NOTE   INTERVAL HISTORY Family members in the room.  Patient sitting in bed, no acute event.  No complaints.  Pending TEE and loop recorder tomorrow.  Platelet dropped to 66 from yesterday 77, continue monitoring, may consider hematology consultation if needed.  Vitals:   04/22/20 0401 04/22/20 0813 04/22/20 1112 04/22/20 1543  BP: 133/71 135/81 137/90 134/81  Pulse: 61 69 67 (!) 58  Resp: 15 16    Temp: 97.8 F (36.6 C) 97.7 F (36.5 C) 98.2 F (36.8 C) 98.1 F (36.7 C)  TempSrc: Oral Oral Oral Oral  SpO2: 99% 98% 99% 99%   CBC:  Recent Labs  Lab 04/19/20 1322 04/19/20 2025 04/21/20 0100 04/22/20 0323  WBC 8.1   < > 7.1 7.6  NEUTROABS 5.7  --   --   --   HGB 13.3   < > 11.5* 11.6*  HCT 39.8   < > 35.1* 35.3*  MCV 94.3   < > 95.1 94.9  PLT 93*   < > 77* 66*   < > = values in this interval not displayed.   Basic Metabolic Panel:  Recent Labs  Lab 04/21/20 0100 04/22/20 0323  NA 139 137  K 3.3* 3.7  CL 108 107  CO2 25 21*  GLUCOSE 90 90  BUN 11 12  CREATININE 0.87 0.91  CALCIUM 8.6* 8.7*  MG 2.0  --   PHOS 3.0  --    Lipid Panel:  Recent Labs  Lab 04/20/20 0355  CHOL 87  TRIG 54  HDL 31*  CHOLHDL 2.8  VLDL 11  LDLCALC 45   HgbA1c:  Recent Labs  Lab 04/20/20 0355  HGBA1C 5.0   Urine Drug Screen:  Recent Labs  Lab 04/19/20 1322  LABOPIA NONE DETECTED  COCAINSCRNUR NONE DETECTED  LABBENZ NONE DETECTED  AMPHETMU NONE DETECTED  THCU NONE DETECTED  LABBARB NONE DETECTED    Alcohol Level  Recent Labs  Lab 04/19/20 1322  ETH <10    IMAGING past 24 hours No results found.  PHYSICAL EXAM Blood pressure 134/81, pulse (!) 58, temperature 98.1 F (36.7 C), temperature source Oral, resp. rate 16, SpO2 99 %.  General: alert and awake, elderly caucasian female, no apparent distress  Lungs: Symmetrical Chest rise, no labored breathing  Cardio: Regular Rate and Rhythm  Abdomen: Soft, non-tender  Neuro: Alert,  oriented, thought content appropriate.  Speech fluent without evidence of aphasia.  Able to follow all commands without difficulty. Cranial Nerves: II:  Visual fields grossly normal, pupils equal, round, reactive to light and accommodation III,IV, VI: ptosis not present, extra-ocular motions intact bilaterally V,VII: smile symmetric, facial light touch sensation normal bilaterally VIII: hearing normal bilaterally IX,X: uvula rises symmetrically XI: bilateral shoulder shrug XII: midline tongue extension without atrophy or fasciculations  Motor: Right :  Upper extremity   5/5                                      Left:     Upper extremity   5/5             Lower extremity   5/5  Lower extremity   5/5 Tone and bulk:normal tone throughout; no atrophy noted Sensory: light touch intact throughout, bilaterally Cerebellar: normal finger-to-nose Gait: deferred    ASSESSMENT/PLAN Natalie [name] is a 70 y.o. female with history of anxiety and depression and hypothyroidism who had a stroke 3 months ago in January 2022 when she presented with diplopia presenting with difficulty speaking. She describes the event aswalking into kitchenwith acupin her hand and suddenlydropped it.Shestared atcupwhilethinking "...did not know what to do"which is out of character for her. On arrival to ED she had difficulty speaking however symptoms have since completely resolved. She reports her symptoms have gradually improved although she still has some difficulty speaking. She denies extremity weakness.  Her platelet count is increasing will continue to monitor. Will not switch from Plavix to Brilinta until Monday due to platelet count..  She was seen by Cardiology yesterday and will be scheduled for TEE and loop recorder placement on Monday.  Stroke - acute left MCA infarcts with a nearly occlusive thrombus in the proximal M2 and M3 branches most likely  embolus from unclear source  CT negative for acute finding  CT head and neck showed left M2/M3 occlusion.    MRI showed left MCA patchy infarct with embolic shower pattern.    DVT negative.    2D echo EF 60 to 65% 02/2020    Recommend TEE and loop recorder on Monday.  LDL 45  HgbA1c 5.0  VTE prophylaxis - Lovenox  aspirin 81 mg daily prior to admission, now on aspirin 81 mg daily and Plavix 75 DAPT.  Dr. Leonie Man recommended aspirin and the Brilinta for 30 days and then Plavix alone.  Will follow platelet level, once platelet>100, will start Brilinta  Therapy recommendations:   Outpatient PT OT  Disposition:  Pending  Hx of stroke  02/2020 admitted with diplopia and dizziness.  MRI showed a bilateral subacute scattered cortical subcortical infarct in the right occipital infarct.  EF 60 to 65%.  CT head and neck negative.  Put on DAPT and Lipitor 40.    Had 30-day cardiac event monitoring as outpatient, report pending.  Thrombocytopenia  Platelet 68-> 77->66  US liver unremarkable  Continue monitoring  On aspirin and Plavix now.  Consider to start aspirin and Brilinta once platelet >100  Management per primary team, may consider hematology consult if needed  Hypertension  Home meds:  None  Stable  Long-term BP goal normotensive  Hyperlipidemia  Home meds:  Lipitor, resumed in hospital  LDL 45, goal < 70  On Lipitor 40 mg daily  Continue statin at discharge  Other Stroke Risk Factors  Advanced Age >/= 31   Former Cigarette smoker  Obesity, BMI 33.89, associated with increased stroke risk, recommend weight loss, diet and exercise as appropriate   Migraines  Other Active Problems  Hypothyroidism - Synthroid 75 mcg daily   Hospital day # 3   Natalie Hawking, MD PhD Stroke Neurology 04/22/2020 4:02 PM     To contact Stroke Continuity provider, please refer to [email or web address] After hours, contact General Neurology

## 2020-04-22 NOTE — H&P (View-Only) (Signed)
Cardiology Consultation:   Patient ID: [name] MRN: [id number]; DOB: Mar 01, 1950  Admit date: 04/19/2020 Date of Consult: 04/22/2020  PCP:  [name], MD   [place]  Cardiologist:  No primary care provider on file.    Patient Profile:   Natalie [name] is a 70 y.o. female with a hx of stroke who is being seen today for the evaluation of cryptogenic stroke at the request of Dr. Cruzita Lederer.  History of Present Illness:   Natalie [name] is a very pleasant 70 year old woman with a history of stroke in 2022.  She has worn a 30-day cardiac monitor in [place], the results are pending.  She presented to the hospital with recurrent symptoms and was found to have a nearly occlusive thrombus in a branch of her middle cerebral artery.  There is also an occlusion in the M3 branch.  The patient has had a very nice neurologic recovery.  Transesophageal echo is pending.  She has a family history of atrial fibrillation but is never been diagnosed with atrial fibrillation.  Of note she did wear the cardiac monitor and we will attempt to obtain the results.   Past Medical History:  Diagnosis Date  . Acute bronchospasm due to viral infection   . Anemia    history of  . CVA (cerebral vascular accident) ([place]) 02/28/2020  . Depressive disorder   . Epistaxis   . Fatigue   . Hearing loss   . Hypothyroidism   . Memory change   . Migraine   . Numbness and tingling   . Obesity (BMI 30.0-34.9)   . Osteoarthritis   . Osteoporosis   . Polyuria   . Premature menopause   . Seizures ([place])    history of mini seizures, possible migraine induced    Past Surgical History:  Procedure Laterality Date  . ABLATION  2006  . CATARACT EXTRACTION W/ INTRAOCULAR LENS IMPLANT Bilateral   . COLONOSCOPY    . COLONOSCOPY WITH PROPOFOL N/A 12/22/2018   Procedure: COLONOSCOPY WITH PROPOFOL;  Surgeon: Virgel Manifold, MD;  Location: [facility] ENDOSCOPY;  Service: Endoscopy;   Laterality: N/A;  . EYE SURGERY  Spring 2018   Cataracts both eyes  . TONSILLECTOMY       Home Medications:  Prior to Admission medications   Medication Sig Start Date End Date Taking? Authorizing Provider  aspirin EC 81 MG tablet Take 81 mg by mouth daily. Take 1 tablet 4 times a week.   Yes [provider]  atorvastatin (LIPITOR) 40 MG tablet Take 1 tablet (40 mg total) by mouth daily. 04/11/20  Yes Sowles, Drue Stager, MD  buPROPion [facility] SR) 150 MG 12 hr tablet Take 150 mg by mouth 2 (two) times daily.   Yes Lew Dawes, MD  citalopram (CELEXA) 40 MG tablet Take 40 mg by mouth daily.   Yes Lew Dawes, MD  clindamycin-benzoyl peroxide [facility]) gel Apply topically 2 (two) times daily. Patient taking differently: Apply 1 application topically 2 (two) times daily as needed (facial acne). 05/10/19  Yes Sowles, Drue Stager, MD  Cyanocobalamin (VITAMIN B-12) 1000 MCG SUBL Place 1,000 mcg under the tongue at bedtime.   Yes [provider]  levothyroxine (SYNTHROID) 75 MCG tablet Take 1 tablet (75 mcg total) by mouth daily before breakfast. 11/09/19  Yes Sowles, Drue Stager, MD  Multiple Vitamin (MULTIVITAMIN WITH MINERALS) TABS tablet Take 1 tablet by mouth daily.   Yes [provider]  Omega-3 Fatty Acids (FISH OIL) 1000  MG CAPS Take 1 capsule by mouth daily.   Yes [provider]  polyvinyl alcohol (ARTIFICIAL TEARS) 1.4 % ophthalmic solution Place 1 drop into both eyes daily as needed for dry eyes.   Yes [provider]  topiramate (TOPAMAX) 25 MG tablet Take 25 mg by mouth daily.    Yes Lew Dawes, MD  Vitamin D, Ergocalciferol, (DRISDOL) 1.25 MG (50000 UNIT) CAPS capsule TAKE 1 CAPSULE BY MOUTH EVERY 7 DAYS Patient taking differently: Take 50,000 Units by mouth every Tuesday. 04/02/20  Yes Sowles, Drue Stager, MD  SUMAtriptan (IMITREX) 100 MG tablet May repeat in 2 hours if headache persists or recurs. Patient not taking: Reported on 04/20/2020  11/09/19   [name], MD    Inpatient Medications: Scheduled Meds: . aspirin  81 mg Oral Daily  . atorvastatin  40 mg Oral Daily  . buPROPion  150 mg Oral BID  . citalopram  20 mg Oral Daily  . clopidogrel  75 mg Oral Daily  . enoxaparin (LOVENOX) injection  40 mg Subcutaneous Q24H  . levothyroxine  75 mcg Oral QAC breakfast  . topiramate  25 mg Oral Daily   Continuous Infusions:  PRN Meds: acetaminophen **OR** acetaminophen (TYLENOL) oral liquid 160 mg/5 mL **OR** acetaminophen  Allergies:    Allergies  Allergen Reactions  . Sulfamethoxazole-Trimethoprim Hives    Social History:   Social History   Socioeconomic History  . Marital status: Single    Spouse name: Not on file  . Number of children: 1  . Years of education: Not on file  . Highest education level: Bachelor's degree (e.g., BA, AB, BS)  Occupational History  . Occupation: retired   Tobacco Use  . Smoking status: Former Smoker    Packs/day: 0.50    Years: 6.00    Pack years: 3.00    Types: Cigarettes    Quit date: 02/11/1974    Years since quitting: 46.2  . Smokeless tobacco: Never Used  Vaping Use  . Vaping Use: Never used  Substance and Sexual Activity  . Alcohol use: Yes    Alcohol/week: 0.0 standard drinks    Comment: 2-3 drinks monthly  . Drug use: Never  . Sexual activity: Not Currently    Comment: Don't use not sexually active  Other Topics Concern  . Not on file  Social History Narrative   Raised an adopted child on her own   Working part time reviewed documents   Social Determinants of Health   Financial Resource Strain: Low Risk   . Difficulty of Paying Living Expenses: Not hard at all  Food Insecurity: No Food Insecurity  . Worried About Charity fundraiser in the Last Year: Never true  . Ran Out of Food in the Last Year: Never true  Transportation Needs: No Transportation Needs  . Lack of Transportation (Medical): No  . Lack of Transportation (Non-Medical): No  Physical  Activity: Inactive  . Days of Exercise per Week: 0 days  . Minutes of Exercise per Session: 0 min  Stress: No Stress Concern Present  . Feeling of Stress : Not at all  Social Connections: Moderately Integrated  . Frequency of Communication with Friends and Family: More than three times a week  . Frequency of Social Gatherings with Friends and Family: Twice a week  . Attends Religious Services: More than 4 times per year  . Active Member of Clubs or Organizations: Yes  . Attends Archivist Meetings: More than 4 times per year  .  Marital Status: Never married  Intimate Partner Violence: Not At Risk  . Fear of Current or Ex-Partner: No  . Emotionally Abused: No  . Physically Abused: No  . Sexually Abused: No    Family History:    Family History  Problem Relation Age of Onset  . Early death Father        suicide  . Depression Father   . Diabetes Father   . Cancer Father        prostate  . Hearing loss Father        due to war  . Alzheimer's disease Mother   . Heart disease Brother   . Atrial fibrillation Brother   . Heart disease Maternal Aunt   . Dementia Maternal Aunt   . Heart disease Maternal Uncle   . Dementia Maternal Grandmother   . Heart disease Brother   . Atrial fibrillation Brother      ROS:  Please see the history of present illness.   All other ROS reviewed and negative.     Physical Exam/Data:   Vitals:   04/21/20 2342 04/22/20 0401 04/22/20 0813 04/22/20 1112  BP: 132/81 133/71 135/81 137/90  Pulse: 67 61 69 67  Resp: 18 15 16    Temp: 97.9 F (36.6 C) 97.8 F (36.6 C) 97.7 F (36.5 C) 98.2 F (36.8 C)  TempSrc: Oral Oral Oral Oral  SpO2: 100% 99% 98% 99%   No intake or output data in the 24 hours ending 04/22/20 1213 Last 3 Weights 04/19/2020 03/08/2020 01/25/2020  Weight (lbs) 210 lb 213 lb 3.2 oz 221 lb 3.2 oz  Weight (kg) 95.255 kg 96.707 kg 100.336 kg     There is no height or weight on file to calculate BMI.  General:  Well  nourished, well developed, in no acute distress HEENT: normal Lymph: no adenopathy Neck: no JVD Endocrine:  No thryomegaly Vascular: No carotid bruits; FA pulses 2+ bilaterally without bruits  Cardiac:  normal S1, S2; RRR; no murmur  Lungs:  clear to auscultation bilaterally, no wheezing, rhonchi or rales  Abd: soft, nontender, no hepatomegaly  Ext: no edema Musculoskeletal:  No deformities, BUE and BLE strength normal and equal Skin: warm and dry  Neuro:  CNs 2-12 intact, no focal abnormalities noted Psych:  Normal affect   EKG:  The EKG was personally reviewed and demonstrates:  nsr Telemetry:  Telemetry was personally reviewed and demonstrates:  nsr  Relevant CV Studies: none  Laboratory Data:  High Sensitivity Troponin:  No results for input(s): TROPONINIHS in the last 720 hours.   Chemistry Recent Labs  Lab 04/19/20 1322 04/19/20 2025 04/21/20 0100 04/22/20 0323  NA 139  --  139 137  K 3.7  --  3.3* 3.7  CL 106  --  108 107  CO2 25  --  25 21*  GLUCOSE 104*  --  90 90  BUN 23  --  11 12  CREATININE 0.88 0.75 0.87 0.91  CALCIUM 8.9  --  8.6* 8.7*  GFRNONAA >60 >60 >60 >60  ANIONGAP 8  --  6 9    Recent Labs  Lab 04/19/20 1322 04/21/20 0100  PROT 7.0 5.9*  ALBUMIN 3.7 3.1*  AST 22 18  ALT 15 14  ALKPHOS 156* 138*  BILITOT 0.7 0.8   Hematology Recent Labs  Lab 04/19/20 2025 04/20/20 0741 04/21/20 0100 04/22/20 0323 04/22/20 0838  WBC 7.9  --  7.1 7.6  --   RBC 3.88  --  3.69* 3.72* 3.85*  HGB 12.0  --  11.5* 11.6*  --   HCT 37.0  --  35.1* 35.3*  --   MCV 95.4  --  95.1 94.9  --   MCH 30.9  --  31.2 31.2  --   MCHC 32.4  --  32.8 32.9  --   RDW 12.2  --  12.2 12.1  --   PLT 77* 68* 77* 66*  --    BNPNo results for input(s): BNP, PROBNP in the last 168 hours.  DDimer  Recent Labs  Lab 04/20/20 0741  DDIMER 3.54*    Radiology/Studies:  CT ANGIOGRAM HEAD NECK W WO CONTRAST  Result Date: 04/19/2020 CLINICAL DATA:  Code stroke follow-up  EXAM: CT ANGIOGRAPHY HEAD AND NECK TECHNIQUE: Multidetector CT imaging of the head and neck was performed using the standard protocol during bolus administration of intravenous contrast. Multiplanar CT image reconstructions and MIPs were obtained to evaluate the vascular anatomy. Carotid stenosis measurements (when applicable) are obtained utilizing NASCET criteria, using the distal internal carotid diameter as the denominator. CONTRAST:  49mL OMNIPAQUE IOHEXOL 350 MG/ML SOLN COMPARISON:  02/29/2020 FINDINGS: CTA NECK Aortic arch: Great vessel origins are patent. Right carotid system: Patent.  No stenosis at the ICA origin. Left carotid system: Patent.  No stenosis at the ICA origin. Vertebral arteries: Patent. Right vertebral artery is dominant. No stenosis. Skeleton: Stable degenerative changes of the cervical spine. Other neck: No new finding. Upper chest: No new finding. Review of the MIP images confirms the above findings CTA HEAD Anterior circulation: Intracranial internal carotid arteries are patent. Left M1 MCA is patent. There is nearly occlusive thrombus within a proximal M2 branch approximately 6 mm from the bifurcation. Subsequent occlusion of a proximal left M3 branch. Right middle and both anterior cerebral arteries are patent. Posterior circulation: Intracranial internal carotid arteries are patent. Basilar artery is patent. Major cerebellar artery origins are patent. Posterior cerebral arteries are patent. Left posterior communicating artery is present. Venous sinuses: Patent as allowed by contrast bolus timing. Review of the MIP images confirms the above findings IMPRESSION: Nearly occlusive thrombus at the bifurcation of a proximal M2 MCA branch approximately 6 mm from the MCA bifurcation. Subsequent occlusion of a proximal left M3 MCA branch. No stenosis in the neck. These results were called by telephone at the time of interpretation on 04/19/2020 at 2:01 pm to provider [facility] , who  verbally acknowledged these results. Electronically Signed   By: Macy Mis M.D.   On: 04/19/2020 14:10   MR BRAIN WO CONTRAST  Result Date: 04/19/2020 CLINICAL DATA:  Initial evaluation for acute stroke. EXAM: MRI HEAD WITHOUT CONTRAST TECHNIQUE: Multiplanar, multiecho pulse sequences of the brain and surrounding structures were obtained without intravenous contrast. COMPARISON:  Prior CTs from earlier the same day as well as previous MRI from 02/28/2020. FINDINGS: Brain: Generalized age appropriate cerebral volume. Mild chronic microvascular ischemic disease again noted. Patchy small volume foci of restricted diffusion seen involving the cortical and subcortical aspect of the posterior left frontoparietal and temporal region, consistent with posterior left MCA distribution infarcts. For reference purposes, the largest of these foci seen at the posterior left temporal region and measures 8 mm. Findings are embolic in distribution. No associated hemorrhage or mass effect. Additional apparent subcentimeter focus of mild diffusion abnormality at the posterior right frontoparietal centrum semi ovale favored to reflect T2 shine through (series 2, image 34). No other evidence for acute or subacute ischemia. Gray-white matter differentiation  otherwise maintained. No acute intracranial hemorrhage. Small focus of susceptibility artifact at the posterior right temporal region noted, consistent with a small chronic microhemorrhage. Additional minimal residual chronic blood products noted about the occipital regions related to recently identified ischemic infarcts. No mass lesion, midline shift or mass effect. No hydrocephalus or extra-axial fluid collection. Pituitary gland suprasellar region within normal limits. Midline structures intact. Vascular: Hypoplastic left vertebral artery not well seen. Major intracranial vascular flow voids are otherwise maintained. Skull and upper cervical spine: Craniocervical junction  within normal limits. Bone marrow signal intensity normal. No focal marrow replacing lesion. No scalp soft tissue abnormality. Sinuses/Orbits: Patient status post bilateral ocular lens replacement. Globes and orbital soft tissues demonstrate no acute finding. Paranasal sinuses are largely clear. No significant mastoid effusion. Inner ear structures grossly normal. Other: None. IMPRESSION: 1. Patchy small volume acute ischemic nonhemorrhagic posterior left MCA distribution infarcts, likely reflecting an embolic shower. No associated hemorrhage or mass effect. 2. No other acute intracranial abnormality. 3. Underlying mild chronic microvascular ischemic disease. Electronically Signed   By: Jeannine Boga M.D.   On: 04/19/2020 23:39   CT HEAD CODE STROKE WO CONTRAST  Addendum Date: 04/19/2020   ADDENDUM REPORT: 04/19/2020 13:56 ADDENDUM: Under ASPECT score calculation there is a dictation error. Should state 7 rather than 6. Electronically Signed   By: Macy Mis M.D.   On: 04/19/2020 13:56   Result Date: 04/19/2020 CLINICAL DATA:  Code stroke. EXAM: CT HEAD WITHOUT CONTRAST TECHNIQUE: Contiguous axial images were obtained from the base of the skull through the vertex without intravenous contrast. COMPARISON:  None. FINDINGS: Brain: No acute intracranial hemorrhage, mass effect, or edema. No definite new loss of gray-white differentiation. Ventricles and sulci are normal in size and configuration. No extra-axial collection. Vascular: Focal density along a left M2 MCA branch within the sylvian fissure. Skull: Unremarkable. Sinuses/Orbits: No acute finding. Other: Mastoid air cells are clear. ASPECTS [facility] Stroke Program Early CT Score) - Ganglionic level infarction (caudate, lentiform nuclei, internal capsule, insula, M1-M3 cortex): 6 - Supraganglionic infarction (M4-M6 cortex): 3 Total score (0-10 with 10 being normal): 9 IMPRESSION: No acute intracranial hemorrhage.  No definite acute infarction.  Possible dense left M2 MCA branch within the sylvian fissure. CTA pending. These results were called by telephone at the time of interpretation on 04/19/2020 at 1:42 pm to provider Lavonia Drafts , who verbally acknowledged these results. Electronically Signed: By: Macy Mis M.D. On: 04/19/2020 13:46   VAS [place] LOWER EXTREMITY VENOUS (DVT)  Result Date: 04/22/2020  Lower Venous DVT Study Indications: Stroke.  Comparison Study: No prior study Performing Technologist: Sharion Dove RVS  Examination Guidelines: A complete evaluation includes B-mode imaging, spectral Doppler, color Doppler, and power Doppler as needed of all accessible portions of each vessel. Bilateral testing is considered an integral part of a complete examination. Limited examinations for reoccurring indications may be performed as noted. The reflux portion of the exam is performed with the patient in reverse Trendelenburg.  +---------+---------------+---------+-----------+----------+--------------+ RIGHT    CompressibilityPhasicitySpontaneityPropertiesThrombus Aging +---------+---------------+---------+-----------+----------+--------------+ CFV      Full           Yes      Yes                                 +---------+---------------+---------+-----------+----------+--------------+ SFJ      Full                                                        +---------+---------------+---------+-----------+----------+--------------+  FV Prox  Full                                                        +---------+---------------+---------+-----------+----------+--------------+ FV Mid   Full                                                        +---------+---------------+---------+-----------+----------+--------------+ FV DistalFull                                                        +---------+---------------+---------+-----------+----------+--------------+ PFV      Full                                                         +---------+---------------+---------+-----------+----------+--------------+ POP      Full           Yes      Yes                                 +---------+---------------+---------+-----------+----------+--------------+ PTV      Full                                                        +---------+---------------+---------+-----------+----------+--------------+ PERO     Full                                                        +---------+---------------+---------+-----------+----------+--------------+   +---------+---------------+---------+-----------+----------+--------------+ LEFT     CompressibilityPhasicitySpontaneityPropertiesThrombus Aging +---------+---------------+---------+-----------+----------+--------------+ CFV      Full           Yes      Yes                                 +---------+---------------+---------+-----------+----------+--------------+ SFJ      Full                                                        +---------+---------------+---------+-----------+----------+--------------+ FV Prox  Full                                                        +---------+---------------+---------+-----------+----------+--------------+  FV Mid   Full                                                        +---------+---------------+---------+-----------+----------+--------------+ FV DistalFull                                                        +---------+---------------+---------+-----------+----------+--------------+ PFV      Full                                                        +---------+---------------+---------+-----------+----------+--------------+ POP      Full           Yes      Yes                                 +---------+---------------+---------+-----------+----------+--------------+ PTV      Full                                                         +---------+---------------+---------+-----------+----------+--------------+ PERO     Full                                                        +---------+---------------+---------+-----------+----------+--------------+     Summary: BILATERAL: - No evidence of deep vein thrombosis seen in the lower extremities, bilaterally. -No evidence of popliteal cyst, bilaterally.   *See table(s) above for measurements and observations. Electronically signed by Ruta Hinds MD on 04/22/2020 at 11:21:13 AM.    Final    US Abdomen Limited RUQ (LIVER/GB)  Result Date: 04/20/2020 CLINICAL DATA:  Thrombocytopenia.  Inpatient. EXAM: ULTRASOUND ABDOMEN LIMITED RIGHT UPPER QUADRANT COMPARISON:  None. FINDINGS: Gallbladder: Numerous layering shadowing gallstones in the gallbladder, largest 1.4 cm. No gallbladder wall thickening. No pericholecystic fluid. No sonographic Murphy's sign. Common bile duct: Diameter: 6 mm Liver: Top-normal liver parenchymal echogenicity. No liver masses. Portal vein is patent on color Doppler imaging with normal direction of blood flow towards the liver. Other: None. IMPRESSION: 1. Cholelithiasis.  No evidence of acute cholecystitis. 2. Top normal caliber common bile duct (6 mm diameter). Suggest correlation with serum bilirubin levels. MRI abdomen with MRCP may be considered as clinically warranted. 3. Normal liver. Electronically Signed   By: Ilona Sorrel M.D.   On: 04/20/2020 13:21     Assessment and Plan:   1. Cryptogenic stroke - the etiology of her stroke is unclear. She has a cardiac monitor pending. We will try to obtain the results. If no atrial fib and the TEE shows no evidence of the etiology of her stroke, then  ILR insertion will be recommended.  Salome Spotted  For questions or updates, please contact [facility] Please consult [email or web address] for contact info under   Signed, Cristopher Peru, MD  04/22/2020 12:13 PM

## 2020-04-22 NOTE — Evaluation (Signed)
Speech Language Pathology Evaluation Patient Details Name: Natalie [name] MRN: [id number] DOB: [birth year] Today's Date: 04/22/2020 Time: 4268-3419 SLP Time Calculation (min) (ACUTE ONLY): 24 min  Problem List:  Patient Active Problem List   Diagnosis Date Noted  . Stroke (cerebrum) ([place]) 04/20/2020  . Acute CVA (cerebrovascular accident) ([place]) 04/19/2020  . CVA (cerebral vascular accident) ([place]) 04/19/2020  . History of ischemic multifocal posterior circulation stroke 02/29/2020  . Thrombocytopenia ([place]) 02/28/2020  . HPV test positive 07/08/2015  . Difficulty hearing 06/28/2015  . Arthritis, degenerative 06/28/2015  . Obesity, Class I, BMI 30-34.9 06/28/2015  . Paresthesia 06/28/2015  . Purpura, nonthrombopenic ([place]) 06/28/2015  . Skin lesion of right leg 06/28/2015  . Other specified hypothyroidism 08/09/2014  . Migraine without aura and without status migrainosus, not intractable 09/19/2008  . Moderate major depression ([place]) 09/07/2006  . OP (osteoporosis) 09/07/2006   Past Medical History:  Past Medical History:  Diagnosis Date  . Acute bronchospasm due to viral infection   . Anemia    history of  . CVA (cerebral vascular accident) ([place]) 02/28/2020  . Depressive disorder   . Epistaxis   . Fatigue   . Hearing loss   . Hypothyroidism   . Memory change   . Migraine   . Numbness and tingling   . Obesity (BMI 30.0-34.9)   . Osteoarthritis   . Osteoporosis   . Polyuria   . Premature menopause   . Seizures ([place])    history of mini seizures, possible migraine induced   Past Surgical History:  Past Surgical History:  Procedure Laterality Date  . ABLATION  2006  . CATARACT EXTRACTION W/ INTRAOCULAR LENS IMPLANT Bilateral   . COLONOSCOPY    . COLONOSCOPY WITH PROPOFOL N/A 12/22/2018   Procedure: COLONOSCOPY WITH PROPOFOL;  Surgeon: Virgel Manifold, MD;  Location: [facility] ENDOSCOPY;  Service: Endoscopy;  Laterality: N/A;  . EYE SURGERY  Spring 2018   Cataracts  both eyes  . TONSILLECTOMY     HPI:  70 yo female presenting 3/17 with confusion and difficulty word-finding. Imaging revealed L MCA infarcts with nearly occlusive thrombus in proximal M2 and M3. Plan for TEE and loop recorder replacement 3/21 at 2pm. PMH includes: CVA in Jan 2022, anxiety, hypothyroidism.   Assessment / Plan / Recommendation Clinical Impression  Pt presents with subtle dysarthria with occasional distortions of consonants, but speech is generally clear and fully intelligible.  Output is fluent. Comprehension and language expression are WNL. Oral reading WNL. No SLP f/u is needed at this time - pt/daughter agree with plan.    SLP Assessment  SLP Recommendation/Assessment: Patient does not need any further Speech Lanaguage Pathology Services SLP Visit Diagnosis: Cognitive communication deficit (R41.841)    Follow Up Recommendations  None    Frequency and Duration   n/a        SLP Evaluation Cognition  Overall Cognitive Status: Within Functional Limits for tasks assessed Arousal/Alertness: Awake/alert Orientation Level: Oriented X4       Comprehension  Auditory Comprehension Overall Auditory Comprehension: Appears within functional limits for tasks assessed Yes/No Questions: Within Functional Limits Commands: Within Functional Limits Conversation: Complex Visual Recognition/Discrimination Discrimination: Within Function Limits Reading Comprehension Reading Status: Within funtional limits    Expression Expression Primary Mode of Expression: Verbal Verbal Expression Overall Verbal Expression: Appears within functional limits for tasks assessed Initiation: No impairment Level of Generative/Spontaneous Verbalization: Conversation Repetition: No impairment Naming: No impairment Pragmatics: No impairment Written Expression Dominant Hand: Right   Oral /  Motor  Oral Motor/Sensory Function Overall Oral Motor/Sensory Function: Within functional limits Motor  Speech Overall Motor Speech: Appears within functional limits for tasks assessed   GO                   Natalie [name] Ringer, [place] CCC/SLP Acute Rehabilitation Services Office number [phone] Pager [phone]  Natalie [name] 04/22/2020, 4:16 PM

## 2020-04-22 NOTE — Plan of Care (Signed)
  Problem: Education: Goal: Knowledge of General Education information will improve Description: Including pain rating scale, medication(s)/side effects and non-pharmacologic comfort measures Outcome: Progressing   Problem: Health Behavior/Discharge Planning: Goal: Ability to manage health-related needs will improve Outcome: Progressing   Problem: Clinical Measurements: Goal: Ability to maintain clinical measurements within normal limits will improve Outcome: Progressing Goal: Will remain free from infection Outcome: Progressing Goal: Diagnostic test results will improve Outcome: Progressing Goal: Respiratory complications will improve Outcome: Progressing Goal: Cardiovascular complication will be avoided Outcome: Progressing   Problem: Activity: Goal: Risk for activity intolerance will decrease Outcome: Progressing   Problem: Nutrition: Goal: Adequate nutrition will be maintained Outcome: Progressing   Problem: Coping: Goal: Level of anxiety will decrease Outcome: Progressing   Problem: Elimination: Goal: Will not experience complications related to bowel motility Outcome: Progressing Goal: Will not experience complications related to urinary retention Outcome: Progressing   Problem: Pain Managment: Goal: General experience of comfort will improve Outcome: Progressing   Problem: Safety: Goal: Ability to remain free from injury will improve Outcome: Progressing   Problem: Skin Integrity: Goal: Risk for impaired skin integrity will decrease Outcome: Progressing   Problem: Education: Goal: Knowledge of disease or condition will improve Outcome: Progressing Goal: Knowledge of secondary prevention will improve Outcome: Progressing Goal: Knowledge of patient specific risk factors addressed and post discharge goals established will improve Outcome: Progressing Goal: Individualized Educational Video(s) Outcome: Progressing   Problem: Coping: Goal: Will verbalize  positive feelings about self Outcome: Progressing   Problem: Ischemic Stroke/TIA Tissue Perfusion: Goal: Complications of ischemic stroke/TIA will be minimized Outcome: Progressing

## 2020-04-22 NOTE — Progress Notes (Signed)
PROGRESS NOTE  Natalie [name] UXL:[id number] DOB: [birth year] DOA: 04/19/2020 PCP: [name], MD   LOS: 3 days   Brief Narrative / Interim history: Natalie [name] is an 70 y.o. female with PMH significant for anxiety and depression and hypothyroidism who had a stroke 3 months ago in January 2022 when she presented with diplopia.  At that time the diplopia resolved and patient was placed on aspirin and atorvastatin.  Last week she had another episode of diplopia which lasted 1 to 2 minutes and resolve spontaneously.  The day prior to this admission patient developed difficulty speaking and thinking.  She also felt somewhat confused.  Apparently her symptoms wax and wane while in the ED. MRI showed patchy small volume acute ischemic nonhemorrhagic posterior left MCA distribution infarcts, possibly embolic shower.  CTA revealed a nearly occlusive thrombus of the left proximal M2 branch and subsequent occlusion of proximal left M3 branch.  Neurology was consulted and she was admitted to the hospital.  Subjective / 24h Interval events: No chest pain, no abdominal pain, no nausea or vomiting.  Complains of diarrhea.  This started yesterday and had about 7 loose bowel movements  Assessment & Plan: Principal Problem Acute left MCA infarcts with a nearly occlusive thrombus in the proximal M2 and M3 branches -Neurology consulted and following.  Discussed with Dr. Erlinda Hong.  Continue aspirin and Plavix.  Lipid panel showed an LDL of 108.  She is on statin.  A1c 5.0.  She is scheduled to get a TEE as well as a loop recorder on Monday.  Lower extremity Dopplers negative for DVT  Active Problems Thrombocytopenia -Patient has no history of thrombocytopenia.  In January 2022 with her prior stroke her platelets were mildly decreased at 108.  Her platelets decreased on admission and are down to 68 yesterday, however improving today.  Possibly consumptive thrombosis, no schistocytes on smear or any other  apparent causes.  She has no liver disease, right upper quadrant ultrasound only showed cholelithiasis as incidental finding. -Platelets increased yesterday but decreasing again today.  Check a B12 level.  May need to get heme input  Hyperlipidemia -Continue statin  Hypothyroidism -Continue Synthroid  Scheduled Meds: . aspirin  81 mg Oral Daily  . atorvastatin  40 mg Oral Daily  . buPROPion  150 mg Oral BID  . citalopram  20 mg Oral Daily  . clopidogrel  75 mg Oral Daily  . enoxaparin (LOVENOX) injection  40 mg Subcutaneous Q24H  . levothyroxine  75 mcg Oral QAC breakfast  . topiramate  25 mg Oral Daily   Continuous Infusions:  PRN Meds:.acetaminophen **OR** acetaminophen (TYLENOL) oral liquid 160 mg/5 mL **OR** acetaminophen  Diet Orders (From admission, onward)    Start     Ordered   04/23/20 0001  Diet NPO time specified Except for: Sips with Meds  Diet effective ____       Comments: Patient to remain NPO until fully awake following the TEE.  Question:  Except for  Answer:  Ferrel Logan with Meds   04/20/20 1751   04/19/20 1913  Diet Heart Room service appropriate? Yes; Fluid consistency: Thin  Diet effective now       Question Answer Comment  Room service appropriate? Yes   Fluid consistency: Thin      04/19/20 1921          DVT prophylaxis: enoxaparin (LOVENOX) injection 40 mg Start: 04/19/20 2100     Code Status: Full Code  Family Communication: No family  at bedside  Status is: Inpatient  inpatient because: Inpatient level of care appropriate due to severity of illness  Dispo: The patient is from: Home              Anticipated d/c is to: Home              Patient currently is not medically stable to d/c.   Difficult to place patient No  Level of care: Telemetry Medical  Consultants:  Neurology   Procedures:  2D echo: Normal EF 60-65%,  Microbiology  none  Antimicrobials: none    Objective: Vitals:   04/21/20 2011 04/21/20 2342 04/22/20 0401  04/22/20 0813  BP: (!) 143/75 132/81 133/71 135/81  Pulse: 75 67 61 69  Resp: 19 18 15 16   Temp: 97.9 F (36.6 C) 97.9 F (36.6 C) 97.8 F (36.6 C) 97.7 F (36.5 C)  TempSrc: Oral Oral Oral Oral  SpO2: 99% 100% 99% 98%   No intake or output data in the 24 hours ending 04/22/20 0911 There were no vitals filed for this visit.  Examination:  Constitutional: NAD Eyes: No icterus ENMT: Moist mucous membranes Neck: normal, supple Respiratory: Lungs are clear bilaterally, no wheezing or crackles heard Cardiovascular: Regular rate and rhythm, no murmurs, no edema Abdomen: Soft, nontender, nondistended, positive bowel sounds Musculoskeletal: no clubbing / cyanosis.  Skin: No rashes appreciated Neurologic: No focal deficits  Data Reviewed: I have independently reviewed following labs and imaging studies   CBC: Recent Labs  Lab 04/19/20 1322 04/19/20 2025 04/20/20 0741 04/21/20 0100 04/22/20 0323  WBC 8.1 7.9  --  7.1 7.6  NEUTROABS 5.7  --   --   --   --   HGB 13.3 12.0  --  11.5* 11.6*  HCT 39.8 37.0  --  35.1* 35.3*  MCV 94.3 95.4  --  95.1 94.9  PLT 93* 77* 68* 77* 66*   Basic Metabolic Panel: Recent Labs  Lab 04/19/20 1322 04/19/20 2025 04/21/20 0100 04/22/20 0323  NA 139  --  139 137  K 3.7  --  3.3* 3.7  CL 106  --  108 107  CO2 25  --  25 21*  GLUCOSE 104*  --  90 90  BUN 23  --  11 12  CREATININE 0.88 0.75 0.87 0.91  CALCIUM 8.9  --  8.6* 8.7*  MG  --   --  2.0  --   PHOS  --   --  3.0  --    Liver Function Tests: Recent Labs  Lab 04/19/20 1322 04/21/20 0100  AST 22 18  ALT 15 14  ALKPHOS 156* 138*  BILITOT 0.7 0.8  PROT 7.0 5.9*  ALBUMIN 3.7 3.1*   Coagulation Profile: Recent Labs  Lab 04/19/20 1322 04/20/20 0741 04/21/20 0100  INR 1.1 1.1 1.1   HbA1C: Recent Labs    04/20/20 0355  HGBA1C 5.0   CBG: No results for input(s): GLUCAP in the last 168 hours.  Recent Results (from the past 240 hour(s))  Resp Panel by RT-PCR (Flu  A&B, Covid) Nasopharyngeal Swab     Status: None   Collection Time: 04/19/20  1:22 PM   Specimen: Nasopharyngeal Swab; Nasopharyngeal(NP) swabs in vial transport medium  Result Value Ref Range Status   SARS Coronavirus 2 by RT PCR NEGATIVE NEGATIVE Final    Comment: (NOTE) SARS-CoV-2 target nucleic acids are NOT DETECTED.  The SARS-CoV-2 RNA is generally detectable in upper respiratory specimens during the acute phase  of infection. The lowest concentration of SARS-CoV-2 viral copies this assay can detect is 138 copies/mL. A negative result does not preclude SARS-Cov-2 infection and should not be used as the sole basis for treatment or other patient management decisions. A negative result may occur with  improper specimen collection/handling, submission of specimen other than nasopharyngeal swab, presence of viral mutation(s) within the areas targeted by this assay, and inadequate number of viral copies(<138 copies/mL). A negative result must be combined with clinical observations, patient history, and epidemiological information. The expected result is Negative.  Fact Sheet for Patients:  EntrepreneurPulse.com.au  Fact Sheet for Healthcare Providers:  IncredibleEmployment.be  This test is no t yet approved or cleared by the [place] FDA and  has been authorized for detection and/or diagnosis of SARS-CoV-2 by FDA under an Emergency Use Authorization (EUA). This EUA will remain  in effect (meaning this test can be used) for the duration of the COVID-19 declaration under Section 564(b)(1) of the Act, 21 U.S.C.section 360bbb-3(b)(1), unless the authorization is terminated  or revoked sooner.       Influenza A by PCR NEGATIVE NEGATIVE Final   Influenza B by PCR NEGATIVE NEGATIVE Final    Comment: (NOTE) The Xpert Xpress SARS-CoV-2/FLU/RSV plus assay is intended as an aid in the diagnosis of influenza from Nasopharyngeal swab specimens  and should not be used as a sole basis for treatment. Nasal washings and aspirates are unacceptable for Xpert Xpress SARS-CoV-2/FLU/RSV testing.  Fact Sheet for Patients: EntrepreneurPulse.com.au  Fact Sheet for Healthcare Providers: IncredibleEmployment.be  This test is not yet approved or cleared by the [place] FDA and has been authorized for detection and/or diagnosis of SARS-CoV-2 by FDA under an Emergency Use Authorization (EUA). This EUA will remain in effect (meaning this test can be used) for the duration of the COVID-19 declaration under Section 564(b)(1) of the Act, 21 U.S.C. section 360bbb-3(b)(1), unless the authorization is terminated or revoked.  Performed at [facility], [address] [place], [place] [address]      Radiology Studies: VAS [place] LOWER EXTREMITY VENOUS (DVT)  Result Date: 04/21/2020  Lower Venous DVT Study Indications: Stroke.  Comparison Study: No prior study Performing Technologist: Sharion Dove RVS  Examination Guidelines: A complete evaluation includes B-mode imaging, spectral Doppler, color Doppler, and power Doppler as needed of all accessible portions of each vessel. Bilateral testing is considered an integral part of a complete examination. Limited examinations for reoccurring indications may be performed as noted. The reflux portion of the exam is performed with the patient in reverse Trendelenburg.  +---------+---------------+---------+-----------+----------+--------------+ RIGHT    CompressibilityPhasicitySpontaneityPropertiesThrombus Aging +---------+---------------+---------+-----------+----------+--------------+ CFV      Full           Yes      Yes                                 +---------+---------------+---------+-----------+----------+--------------+ SFJ      Full                                                         +---------+---------------+---------+-----------+----------+--------------+ FV Prox  Full                                                        +---------+---------------+---------+-----------+----------+--------------+  FV Mid   Full                                                        +---------+---------------+---------+-----------+----------+--------------+ FV DistalFull                                                        +---------+---------------+---------+-----------+----------+--------------+ PFV      Full                                                        +---------+---------------+---------+-----------+----------+--------------+ POP      Full           Yes      Yes                                 +---------+---------------+---------+-----------+----------+--------------+ PTV      Full                                                        +---------+---------------+---------+-----------+----------+--------------+ PERO     Full                                                        +---------+---------------+---------+-----------+----------+--------------+   +---------+---------------+---------+-----------+----------+--------------+ LEFT     CompressibilityPhasicitySpontaneityPropertiesThrombus Aging +---------+---------------+---------+-----------+----------+--------------+ CFV      Full           Yes      Yes                                 +---------+---------------+---------+-----------+----------+--------------+ SFJ      Full                                                        +---------+---------------+---------+-----------+----------+--------------+ FV Prox  Full                                                        +---------+---------------+---------+-----------+----------+--------------+ FV Mid   Full                                                         +---------+---------------+---------+-----------+----------+--------------+  FV DistalFull                                                        +---------+---------------+---------+-----------+----------+--------------+ PFV      Full                                                        +---------+---------------+---------+-----------+----------+--------------+ POP      Full           Yes      Yes                                 +---------+---------------+---------+-----------+----------+--------------+ PTV      Full                                                        +---------+---------------+---------+-----------+----------+--------------+ PERO     Full                                                        +---------+---------------+---------+-----------+----------+--------------+     Summary: BILATERAL: - No evidence of deep vein thrombosis seen in the lower extremities, bilaterally. -No evidence of popliteal cyst, bilaterally.   *See table(s) above for measurements and observations.    Preliminary    Marzetta Board, MD, PhD [facility]  Between 7 am - 7 pm I am available, please contact me via Amion or Securechat  Between 7 pm - 7 am I am not available, please contact night coverage MD/APP via Amion

## 2020-04-22 NOTE — Consult Note (Signed)
Cardiology Consultation:   Patient ID: Natalie [name] MRN: [id number]; DOB: April 20, 1950  Admit date: 04/19/2020 Date of Consult: 04/22/2020  PCP:  [name], MD   [place]  Cardiologist:  No primary care provider on file.    Patient Profile:   Natalie [name] is a 69 y.o. female with a hx of stroke who is being seen today for the evaluation of cryptogenic stroke at the request of Dr. Cruzita Lederer.  History of Present Illness:   Natalie [name] is a very pleasant 70 year old woman with a history of stroke in 2022.  She has worn a 30-day cardiac monitor in [place], the results are pending.  She presented to the hospital with recurrent symptoms and was found to have a nearly occlusive thrombus in a branch of her middle cerebral artery.  There is also an occlusion in the M3 branch.  The patient has had a very nice neurologic recovery.  Transesophageal echo is pending.  She has a family history of atrial fibrillation but is never been diagnosed with atrial fibrillation.  Of note she did wear the cardiac monitor and we will attempt to obtain the results.   Past Medical History:  Diagnosis Date  . Acute bronchospasm due to viral infection   . Anemia    history of  . CVA (cerebral vascular accident) ([place]) 02/28/2020  . Depressive disorder   . Epistaxis   . Fatigue   . Hearing loss   . Hypothyroidism   . Memory change   . Migraine   . Numbness and tingling   . Obesity (BMI 30.0-34.9)   . Osteoarthritis   . Osteoporosis   . Polyuria   . Premature menopause   . Seizures ([place])    history of mini seizures, possible migraine induced    Past Surgical History:  Procedure Laterality Date  . ABLATION  2006  . CATARACT EXTRACTION W/ INTRAOCULAR LENS IMPLANT Bilateral   . COLONOSCOPY    . COLONOSCOPY WITH PROPOFOL N/A 12/22/2018   Procedure: COLONOSCOPY WITH PROPOFOL;  Surgeon: Virgel Manifold, MD;  Location: [facility] ENDOSCOPY;  Service: Endoscopy;   Laterality: N/A;  . EYE SURGERY  Spring 2018   Cataracts both eyes  . TONSILLECTOMY       Home Medications:  Prior to Admission medications   Medication Sig Start Date End Date Taking? Authorizing Provider  aspirin EC 81 MG tablet Take 81 mg by mouth daily. Take 1 tablet 4 times a week.   Yes [provider]  atorvastatin (LIPITOR) 40 MG tablet Take 1 tablet (40 mg total) by mouth daily. 04/11/20  Yes Sowles, Drue Stager, MD  buPROPion [facility] SR) 150 MG 12 hr tablet Take 150 mg by mouth 2 (two) times daily.   Yes Lew Dawes, MD  citalopram (CELEXA) 40 MG tablet Take 40 mg by mouth daily.   Yes Lew Dawes, MD  clindamycin-benzoyl peroxide [facility]) gel Apply topically 2 (two) times daily. Patient taking differently: Apply 1 application topically 2 (two) times daily as needed (facial acne). 05/10/19  Yes Sowles, Drue Stager, MD  Cyanocobalamin (VITAMIN B-12) 1000 MCG SUBL Place 1,000 mcg under the tongue at bedtime.   Yes [provider]  levothyroxine (SYNTHROID) 75 MCG tablet Take 1 tablet (75 mcg total) by mouth daily before breakfast. 11/09/19  Yes Sowles, Drue Stager, MD  Multiple Vitamin (MULTIVITAMIN WITH MINERALS) TABS tablet Take 1 tablet by mouth daily.   Yes [provider]  Omega-3 Fatty Acids (FISH OIL) 1000  MG CAPS Take 1 capsule by mouth daily.   Yes [provider]  polyvinyl alcohol (ARTIFICIAL TEARS) 1.4 % ophthalmic solution Place 1 drop into both eyes daily as needed for dry eyes.   Yes [provider]  topiramate (TOPAMAX) 25 MG tablet Take 25 mg by mouth daily.    Yes Lew Dawes, MD  Vitamin D, Ergocalciferol, (DRISDOL) 1.25 MG (50000 UNIT) CAPS capsule TAKE 1 CAPSULE BY MOUTH EVERY 7 DAYS Patient taking differently: Take 50,000 Units by mouth every Tuesday. 04/02/20  Yes Sowles, Drue Stager, MD  SUMAtriptan (IMITREX) 100 MG tablet May repeat in 2 hours if headache persists or recurs. Patient not taking: Reported on 04/20/2020  11/09/19   [name], MD    Inpatient Medications: Scheduled Meds: . aspirin  81 mg Oral Daily  . atorvastatin  40 mg Oral Daily  . buPROPion  150 mg Oral BID  . citalopram  20 mg Oral Daily  . clopidogrel  75 mg Oral Daily  . enoxaparin (LOVENOX) injection  40 mg Subcutaneous Q24H  . levothyroxine  75 mcg Oral QAC breakfast  . topiramate  25 mg Oral Daily   Continuous Infusions:  PRN Meds: acetaminophen **OR** acetaminophen (TYLENOL) oral liquid 160 mg/5 mL **OR** acetaminophen  Allergies:    Allergies  Allergen Reactions  . Sulfamethoxazole-Trimethoprim Hives    Social History:   Social History   Socioeconomic History  . Marital status: Single    Spouse name: Not on file  . Number of children: 1  . Years of education: Not on file  . Highest education level: Bachelor's degree (e.g., BA, AB, BS)  Occupational History  . Occupation: retired   Tobacco Use  . Smoking status: Former Smoker    Packs/day: 0.50    Years: 6.00    Pack years: 3.00    Types: Cigarettes    Quit date: 02/11/1974    Years since quitting: 46.2  . Smokeless tobacco: Never Used  Vaping Use  . Vaping Use: Never used  Substance and Sexual Activity  . Alcohol use: Yes    Alcohol/week: 0.0 standard drinks    Comment: 2-3 drinks monthly  . Drug use: Never  . Sexual activity: Not Currently    Comment: Don't use not sexually active  Other Topics Concern  . Not on file  Social History Narrative   Raised an adopted child on her own   Working part time reviewed documents   Social Determinants of Health   Financial Resource Strain: Low Risk   . Difficulty of Paying Living Expenses: Not hard at all  Food Insecurity: No Food Insecurity  . Worried About Charity fundraiser in the Last Year: Never true  . Ran Out of Food in the Last Year: Never true  Transportation Needs: No Transportation Needs  . Lack of Transportation (Medical): No  . Lack of Transportation (Non-Medical): No  Physical  Activity: Inactive  . Days of Exercise per Week: 0 days  . Minutes of Exercise per Session: 0 min  Stress: No Stress Concern Present  . Feeling of Stress : Not at all  Social Connections: Moderately Integrated  . Frequency of Communication with Friends and Family: More than three times a week  . Frequency of Social Gatherings with Friends and Family: Twice a week  . Attends Religious Services: More than 4 times per year  . Active Member of Clubs or Organizations: Yes  . Attends Archivist Meetings: More than 4 times per year  .  Marital Status: Never married  Intimate Partner Violence: Not At Risk  . Fear of Current or Ex-Partner: No  . Emotionally Abused: No  . Physically Abused: No  . Sexually Abused: No    Family History:    Family History  Problem Relation Age of Onset  . Early death Father        suicide  . Depression Father   . Diabetes Father   . Cancer Father        prostate  . Hearing loss Father        due to war  . Alzheimer's disease Mother   . Heart disease Brother   . Atrial fibrillation Brother   . Heart disease Maternal Aunt   . Dementia Maternal Aunt   . Heart disease Maternal Uncle   . Dementia Maternal Grandmother   . Heart disease Brother   . Atrial fibrillation Brother      ROS:  Please see the history of present illness.   All other ROS reviewed and negative.     Physical Exam/Data:   Vitals:   04/21/20 2342 04/22/20 0401 04/22/20 0813 04/22/20 1112  BP: 132/81 133/71 135/81 137/90  Pulse: 67 61 69 67  Resp: 18 15 16    Temp: 97.9 F (36.6 C) 97.8 F (36.6 C) 97.7 F (36.5 C) 98.2 F (36.8 C)  TempSrc: Oral Oral Oral Oral  SpO2: 100% 99% 98% 99%   No intake or output data in the 24 hours ending 04/22/20 1213 Last 3 Weights 04/19/2020 03/08/2020 01/25/2020  Weight (lbs) 210 lb 213 lb 3.2 oz 221 lb 3.2 oz  Weight (kg) 95.255 kg 96.707 kg 100.336 kg     There is no height or weight on file to calculate BMI.  General:  Well  nourished, well developed, in no acute distress HEENT: normal Lymph: no adenopathy Neck: no JVD Endocrine:  No thryomegaly Vascular: No carotid bruits; FA pulses 2+ bilaterally without bruits  Cardiac:  normal S1, S2; RRR; no murmur  Lungs:  clear to auscultation bilaterally, no wheezing, rhonchi or rales  Abd: soft, nontender, no hepatomegaly  Ext: no edema Musculoskeletal:  No deformities, BUE and BLE strength normal and equal Skin: warm and dry  Neuro:  CNs 2-12 intact, no focal abnormalities noted Psych:  Normal affect   EKG:  The EKG was personally reviewed and demonstrates:  nsr Telemetry:  Telemetry was personally reviewed and demonstrates:  nsr  Relevant CV Studies: none  Laboratory Data:  High Sensitivity Troponin:  No results for input(s): TROPONINIHS in the last 720 hours.   Chemistry Recent Labs  Lab 04/19/20 1322 04/19/20 2025 04/21/20 0100 04/22/20 0323  NA 139  --  139 137  K 3.7  --  3.3* 3.7  CL 106  --  108 107  CO2 25  --  25 21*  GLUCOSE 104*  --  90 90  BUN 23  --  11 12  CREATININE 0.88 0.75 0.87 0.91  CALCIUM 8.9  --  8.6* 8.7*  GFRNONAA >60 >60 >60 >60  ANIONGAP 8  --  6 9    Recent Labs  Lab 04/19/20 1322 04/21/20 0100  PROT 7.0 5.9*  ALBUMIN 3.7 3.1*  AST 22 18  ALT 15 14  ALKPHOS 156* 138*  BILITOT 0.7 0.8   Hematology Recent Labs  Lab 04/19/20 2025 04/20/20 0741 04/21/20 0100 04/22/20 0323 04/22/20 0838  WBC 7.9  --  7.1 7.6  --   RBC 3.88  --  3.69* 3.72* 3.85*  HGB 12.0  --  11.5* 11.6*  --   HCT 37.0  --  35.1* 35.3*  --   MCV 95.4  --  95.1 94.9  --   MCH 30.9  --  31.2 31.2  --   MCHC 32.4  --  32.8 32.9  --   RDW 12.2  --  12.2 12.1  --   PLT 77* 68* 77* 66*  --    BNPNo results for input(s): BNP, PROBNP in the last 168 hours.  DDimer  Recent Labs  Lab 04/20/20 0741  DDIMER 3.54*    Radiology/Studies:  CT ANGIOGRAM HEAD NECK W WO CONTRAST  Result Date: 04/19/2020 CLINICAL DATA:  Code stroke follow-up  EXAM: CT ANGIOGRAPHY HEAD AND NECK TECHNIQUE: Multidetector CT imaging of the head and neck was performed using the standard protocol during bolus administration of intravenous contrast. Multiplanar CT image reconstructions and MIPs were obtained to evaluate the vascular anatomy. Carotid stenosis measurements (when applicable) are obtained utilizing NASCET criteria, using the distal internal carotid diameter as the denominator. CONTRAST:  61mL OMNIPAQUE IOHEXOL 350 MG/ML SOLN COMPARISON:  02/29/2020 FINDINGS: CTA NECK Aortic arch: Great vessel origins are patent. Right carotid system: Patent.  No stenosis at the ICA origin. Left carotid system: Patent.  No stenosis at the ICA origin. Vertebral arteries: Patent. Right vertebral artery is dominant. No stenosis. Skeleton: Stable degenerative changes of the cervical spine. Other neck: No new finding. Upper chest: No new finding. Review of the MIP images confirms the above findings CTA HEAD Anterior circulation: Intracranial internal carotid arteries are patent. Left M1 MCA is patent. There is nearly occlusive thrombus within a proximal M2 branch approximately 6 mm from the bifurcation. Subsequent occlusion of a proximal left M3 branch. Right middle and both anterior cerebral arteries are patent. Posterior circulation: Intracranial internal carotid arteries are patent. Basilar artery is patent. Major cerebellar artery origins are patent. Posterior cerebral arteries are patent. Left posterior communicating artery is present. Venous sinuses: Patent as allowed by contrast bolus timing. Review of the MIP images confirms the above findings IMPRESSION: Nearly occlusive thrombus at the bifurcation of a proximal M2 MCA branch approximately 6 mm from the MCA bifurcation. Subsequent occlusion of a proximal left M3 MCA branch. No stenosis in the neck. These results were called by telephone at the time of interpretation on 04/19/2020 at 2:01 pm to provider [facility] , who  verbally acknowledged these results. Electronically Signed   By: Macy Mis M.D.   On: 04/19/2020 14:10   MR BRAIN WO CONTRAST  Result Date: 04/19/2020 CLINICAL DATA:  Initial evaluation for acute stroke. EXAM: MRI HEAD WITHOUT CONTRAST TECHNIQUE: Multiplanar, multiecho pulse sequences of the brain and surrounding structures were obtained without intravenous contrast. COMPARISON:  Prior CTs from earlier the same day as well as previous MRI from 02/28/2020. FINDINGS: Brain: Generalized age appropriate cerebral volume. Mild chronic microvascular ischemic disease again noted. Patchy small volume foci of restricted diffusion seen involving the cortical and subcortical aspect of the posterior left frontoparietal and temporal region, consistent with posterior left MCA distribution infarcts. For reference purposes, the largest of these foci seen at the posterior left temporal region and measures 8 mm. Findings are embolic in distribution. No associated hemorrhage or mass effect. Additional apparent subcentimeter focus of mild diffusion abnormality at the posterior right frontoparietal centrum semi ovale favored to reflect T2 shine through (series 2, image 34). No other evidence for acute or subacute ischemia. Gray-white matter differentiation  otherwise maintained. No acute intracranial hemorrhage. Small focus of susceptibility artifact at the posterior right temporal region noted, consistent with a small chronic microhemorrhage. Additional minimal residual chronic blood products noted about the occipital regions related to recently identified ischemic infarcts. No mass lesion, midline shift or mass effect. No hydrocephalus or extra-axial fluid collection. Pituitary gland suprasellar region within normal limits. Midline structures intact. Vascular: Hypoplastic left vertebral artery not well seen. Major intracranial vascular flow voids are otherwise maintained. Skull and upper cervical spine: Craniocervical junction  within normal limits. Bone marrow signal intensity normal. No focal marrow replacing lesion. No scalp soft tissue abnormality. Sinuses/Orbits: Patient status post bilateral ocular lens replacement. Globes and orbital soft tissues demonstrate no acute finding. Paranasal sinuses are largely clear. No significant mastoid effusion. Inner ear structures grossly normal. Other: None. IMPRESSION: 1. Patchy small volume acute ischemic nonhemorrhagic posterior left MCA distribution infarcts, likely reflecting an embolic shower. No associated hemorrhage or mass effect. 2. No other acute intracranial abnormality. 3. Underlying mild chronic microvascular ischemic disease. Electronically Signed   By: Jeannine Boga M.D.   On: 04/19/2020 23:39   CT HEAD CODE STROKE WO CONTRAST  Addendum Date: 04/19/2020   ADDENDUM REPORT: 04/19/2020 13:56 ADDENDUM: Under ASPECT score calculation there is a dictation error. Should state 7 rather than 6. Electronically Signed   By: Macy Mis M.D.   On: 04/19/2020 13:56   Result Date: 04/19/2020 CLINICAL DATA:  Code stroke. EXAM: CT HEAD WITHOUT CONTRAST TECHNIQUE: Contiguous axial images were obtained from the base of the skull through the vertex without intravenous contrast. COMPARISON:  None. FINDINGS: Brain: No acute intracranial hemorrhage, mass effect, or edema. No definite new loss of gray-white differentiation. Ventricles and sulci are normal in size and configuration. No extra-axial collection. Vascular: Focal density along a left M2 MCA branch within the sylvian fissure. Skull: Unremarkable. Sinuses/Orbits: No acute finding. Other: Mastoid air cells are clear. ASPECTS [facility] Stroke Program Early CT Score) - Ganglionic level infarction (caudate, lentiform nuclei, internal capsule, insula, M1-M3 cortex): 6 - Supraganglionic infarction (M4-M6 cortex): 3 Total score (0-10 with 10 being normal): 9 IMPRESSION: No acute intracranial hemorrhage.  No definite acute infarction.  Possible dense left M2 MCA branch within the sylvian fissure. CTA pending. These results were called by telephone at the time of interpretation on 04/19/2020 at 1:42 pm to provider Lavonia Drafts , who verbally acknowledged these results. Electronically Signed: By: Macy Mis M.D. On: 04/19/2020 13:46   VAS [place] LOWER EXTREMITY VENOUS (DVT)  Result Date: 04/22/2020  Lower Venous DVT Study Indications: Stroke.  Comparison Study: No prior study Performing Technologist: Sharion Dove RVS  Examination Guidelines: A complete evaluation includes B-mode imaging, spectral Doppler, color Doppler, and power Doppler as needed of all accessible portions of each vessel. Bilateral testing is considered an integral part of a complete examination. Limited examinations for reoccurring indications may be performed as noted. The reflux portion of the exam is performed with the patient in reverse Trendelenburg.  +---------+---------------+---------+-----------+----------+--------------+ RIGHT    CompressibilityPhasicitySpontaneityPropertiesThrombus Aging +---------+---------------+---------+-----------+----------+--------------+ CFV      Full           Yes      Yes                                 +---------+---------------+---------+-----------+----------+--------------+ SFJ      Full                                                        +---------+---------------+---------+-----------+----------+--------------+  FV Prox  Full                                                        +---------+---------------+---------+-----------+----------+--------------+ FV Mid   Full                                                        +---------+---------------+---------+-----------+----------+--------------+ FV DistalFull                                                        +---------+---------------+---------+-----------+----------+--------------+ PFV      Full                                                         +---------+---------------+---------+-----------+----------+--------------+ POP      Full           Yes      Yes                                 +---------+---------------+---------+-----------+----------+--------------+ PTV      Full                                                        +---------+---------------+---------+-----------+----------+--------------+ PERO     Full                                                        +---------+---------------+---------+-----------+----------+--------------+   +---------+---------------+---------+-----------+----------+--------------+ LEFT     CompressibilityPhasicitySpontaneityPropertiesThrombus Aging +---------+---------------+---------+-----------+----------+--------------+ CFV      Full           Yes      Yes                                 +---------+---------------+---------+-----------+----------+--------------+ SFJ      Full                                                        +---------+---------------+---------+-----------+----------+--------------+ FV Prox  Full                                                        +---------+---------------+---------+-----------+----------+--------------+  FV Mid   Full                                                        +---------+---------------+---------+-----------+----------+--------------+ FV DistalFull                                                        +---------+---------------+---------+-----------+----------+--------------+ PFV      Full                                                        +---------+---------------+---------+-----------+----------+--------------+ POP      Full           Yes      Yes                                 +---------+---------------+---------+-----------+----------+--------------+ PTV      Full                                                         +---------+---------------+---------+-----------+----------+--------------+ PERO     Full                                                        +---------+---------------+---------+-----------+----------+--------------+     Summary: BILATERAL: - No evidence of deep vein thrombosis seen in the lower extremities, bilaterally. -No evidence of popliteal cyst, bilaterally.   *See table(s) above for measurements and observations. Electronically signed by Ruta Hinds MD on 04/22/2020 at 11:21:13 AM.    Final    US Abdomen Limited RUQ (LIVER/GB)  Result Date: 04/20/2020 CLINICAL DATA:  Thrombocytopenia.  Inpatient. EXAM: ULTRASOUND ABDOMEN LIMITED RIGHT UPPER QUADRANT COMPARISON:  None. FINDINGS: Gallbladder: Numerous layering shadowing gallstones in the gallbladder, largest 1.4 cm. No gallbladder wall thickening. No pericholecystic fluid. No sonographic Murphy's sign. Common bile duct: Diameter: 6 mm Liver: Top-normal liver parenchymal echogenicity. No liver masses. Portal vein is patent on color Doppler imaging with normal direction of blood flow towards the liver. Other: None. IMPRESSION: 1. Cholelithiasis.  No evidence of acute cholecystitis. 2. Top normal caliber common bile duct (6 mm diameter). Suggest correlation with serum bilirubin levels. MRI abdomen with MRCP may be considered as clinically warranted. 3. Normal liver. Electronically Signed   By: Ilona Sorrel M.D.   On: 04/20/2020 13:21     Assessment and Plan:   1. Cryptogenic stroke - the etiology of her stroke is unclear. She has a cardiac monitor pending. We will try to obtain the results. If no atrial fib and the TEE shows no evidence of the etiology of her stroke, then  ILR insertion will be recommended.  Salome Spotted  For questions or updates, please contact [facility] Please consult [email or web address] for contact info under   Signed, Cristopher Peru, MD  04/22/2020 12:13 PM

## 2020-04-23 ENCOUNTER — Inpatient Hospital Stay (HOSPITAL_COMMUNITY): Payer: Medicare Other | Admitting: Certified Registered"

## 2020-04-23 ENCOUNTER — Encounter (HOSPITAL_COMMUNITY)
Admission: AD | Disposition: A | Discharge status: Home / Self Care | Payer: Self-pay | Source: Other Acute Inpatient Hospital | Attending: Internal Medicine

## 2020-04-23 ENCOUNTER — Encounter (HOSPITAL_COMMUNITY): Payer: Self-pay | Admitting: Internal Medicine

## 2020-04-23 ENCOUNTER — Inpatient Hospital Stay (HOSPITAL_COMMUNITY)
Admission: AD | Admit: 2020-04-23 | Discharge: 2020-04-23 | Disposition: A | Discharge status: Home / Self Care | Payer: Medicare Other | Source: Other Acute Inpatient Hospital | Attending: Physician Assistant | Admitting: Physician Assistant

## 2020-04-23 ENCOUNTER — Inpatient Hospital Stay (HOSPITAL_COMMUNITY): Payer: Medicare Other

## 2020-04-23 DIAGNOSIS — I6389 Other cerebral infarction: Secondary | ICD-10-CM

## 2020-04-23 DIAGNOSIS — E039 Hypothyroidism, unspecified: Secondary | ICD-10-CM

## 2020-04-23 DIAGNOSIS — I639 Cerebral infarction, unspecified: Secondary | ICD-10-CM

## 2020-04-23 DIAGNOSIS — Z79899 Other long term (current) drug therapy: Secondary | ICD-10-CM

## 2020-04-23 DIAGNOSIS — D696 Thrombocytopenia, unspecified: Secondary | ICD-10-CM

## 2020-04-23 DIAGNOSIS — E785 Hyperlipidemia, unspecified: Secondary | ICD-10-CM

## 2020-04-23 DIAGNOSIS — Z7982 Long term (current) use of aspirin: Secondary | ICD-10-CM

## 2020-04-23 DIAGNOSIS — Z7901 Long term (current) use of anticoagulants: Secondary | ICD-10-CM

## 2020-04-23 HISTORY — PX: TEE WITHOUT CARDIOVERSION: SHX5443

## 2020-04-23 HISTORY — PX: LOOP RECORDER INSERTION: EP1214

## 2020-04-23 HISTORY — PX: BUBBLE STUDY: SHX6837

## 2020-04-23 LAB — COMPREHENSIVE METABOLIC PANEL
ALT: 19 U/L (ref 0–44)
AST: 25 U/L (ref 15–41)
Albumin: 3.2 g/dL — ABNORMAL LOW (ref 3.5–5.0)
Alkaline Phosphatase: 136 U/L — ABNORMAL HIGH (ref 38–126)
Anion gap: 6 (ref 5–15)
BUN: 11 mg/dL (ref 8–23)
CO2: 26 mmol/L (ref 22–32)
Calcium: 9 mg/dL (ref 8.9–10.3)
Chloride: 106 mmol/L (ref 98–111)
Creatinine, Ser: 0.87 mg/dL (ref 0.44–1.00)
GFR, Estimated: >60 mL/min (ref >60)
Glucose, Bld: 108 mg/dL — ABNORMAL HIGH (ref 70–99)
Potassium: 3.7 mmol/L (ref 3.5–5.1)
Sodium: 138 mmol/L (ref 135–145)
Total Bilirubin: 0.8 mg/dL (ref 0.3–1.2)
Total Protein: 6.1 g/dL — ABNORMAL LOW (ref 6.5–8.1)

## 2020-04-23 LAB — CBC WITH DIFFERENTIAL/PLATELET
Abs Immature Granulocytes: 0.01 10*3/uL (ref 0.00–0.07)
Basophils Absolute: 0 10*3/uL (ref 0.0–0.1)
Basophils Relative: 0 %
Eosinophils Absolute: 0.2 10*3/uL (ref 0.0–0.5)
Eosinophils Relative: 4 %
HCT: 36.5 % (ref 36.0–46.0)
Hemoglobin: 12 g/dL (ref 12.0–15.0)
Immature Granulocytes: 0 %
Lymphocytes Relative: 26 %
Lymphs Abs: 1.1 10*3/uL (ref 0.7–4.0)
MCH: 31.1 pg (ref 26.0–34.0)
MCHC: 32.9 g/dL (ref 30.0–36.0)
MCV: 94.6 fL (ref 80.0–100.0)
Monocytes Absolute: 0.3 10*3/uL (ref 0.1–1.0)
Monocytes Relative: 6 %
Neutro Abs: 2.8 10*3/uL (ref 1.7–7.7)
Neutrophils Relative %: 64 %
Platelets: 59 10*3/uL — ABNORMAL LOW (ref 150–400)
RBC: 3.86 MIL/uL — ABNORMAL LOW (ref 3.87–5.11)
RDW: 12 % (ref 11.5–15.5)
WBC: 4.4 10*3/uL (ref 4.0–10.5)
nRBC: 0 % (ref 0.0–0.2)

## 2020-04-23 IMAGING — CT CT ABD-PELV W/ CM
2 of 5 series · 16 of 46 positions shown, 18 images · IV contrast (APPLIED)
Comparison: Ultrasound [DATE].

CLINICAL DATA: Splenomegaly

EXAM:
CT ABDOMEN AND PELVIS WITH CONTRAST
TECHNIQUE: Multidetector CT imaging of the abdomen and pelvis was performed
using the standard protocol following bolus administration of
intravenous contrast.
CONTRAST:  100mL OMNIPAQUE IOHEXOL 300 MG/ML  SOLN

[Series 3: abdomen 5.0 · axial · 0.98mm/px · z∈[-544,-124]mm · 13 of 100 slices shown, 15 images]
[im 8/100  soft-tissue]
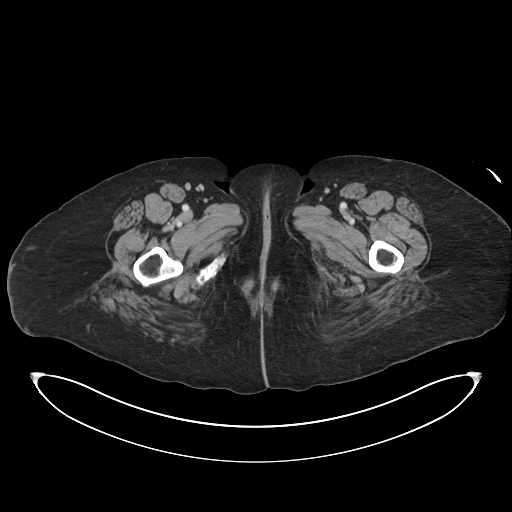
[im 8/100  bone]
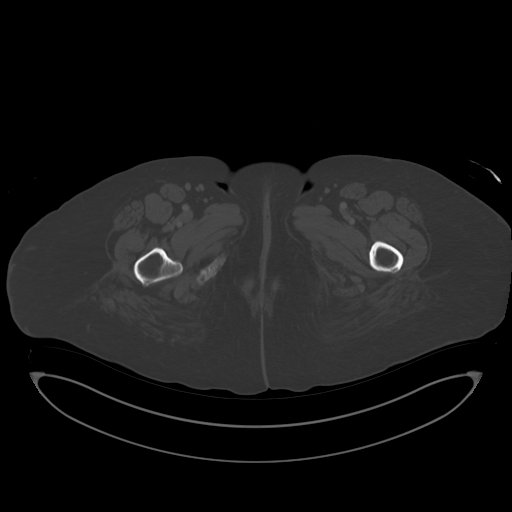
[im 15/100  soft-tissue]
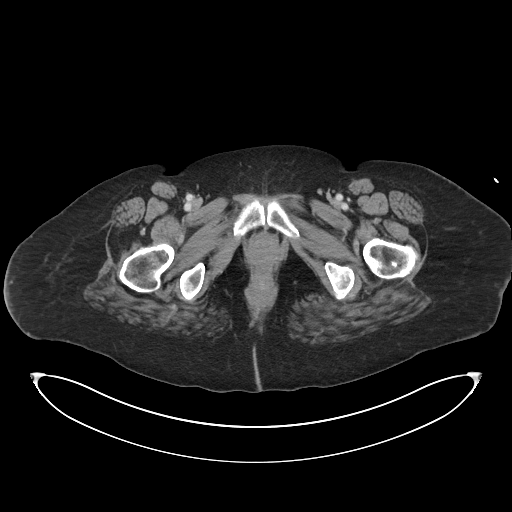
[im 22/100  soft-tissue]
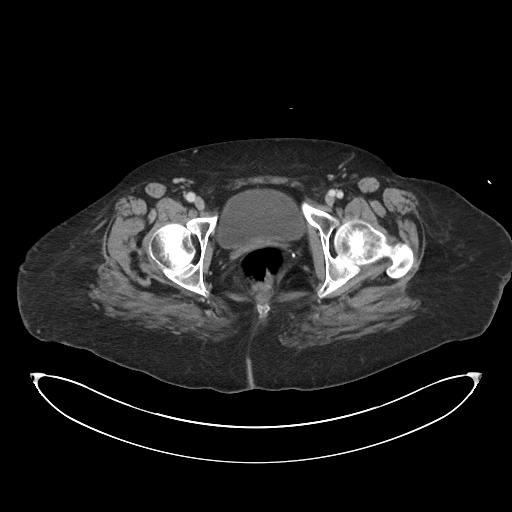
[im 29/100  soft-tissue]
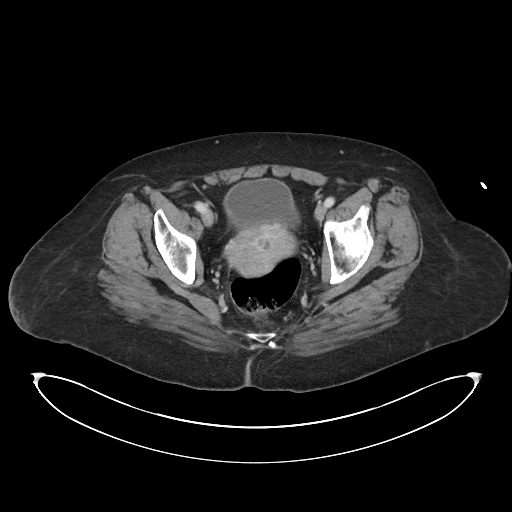
[im 36/100  soft-tissue]
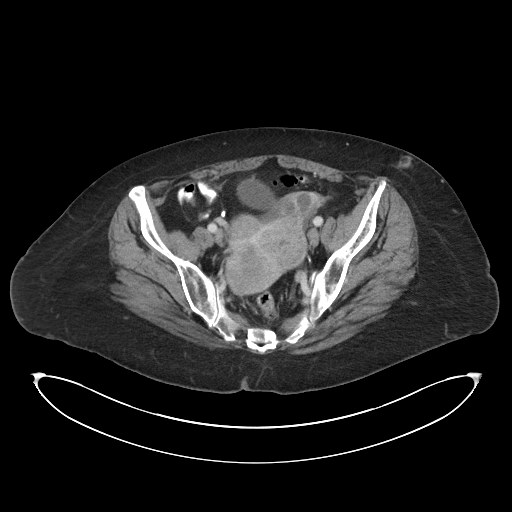
[im 43/100  soft-tissue]
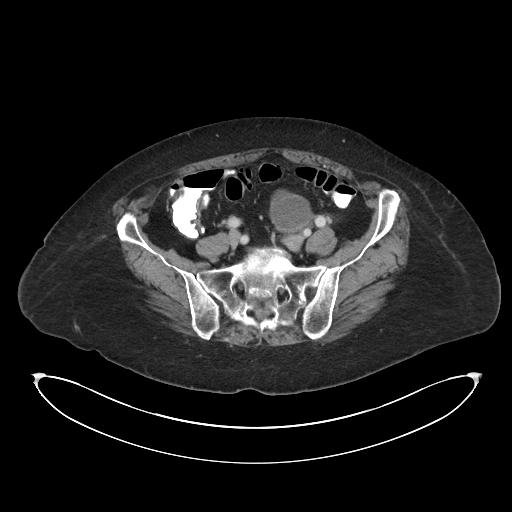
[im 50/100  soft-tissue]
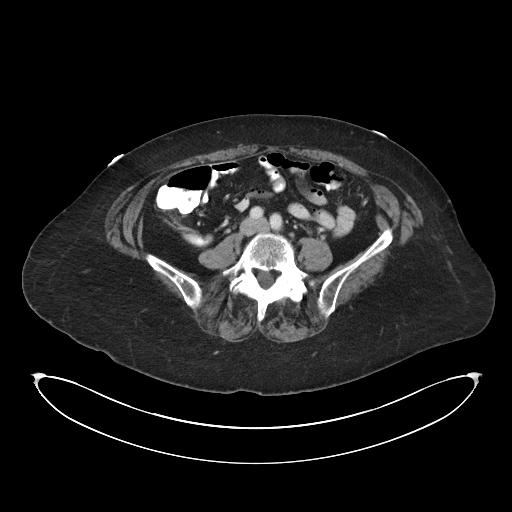
[im 57/100  soft-tissue]
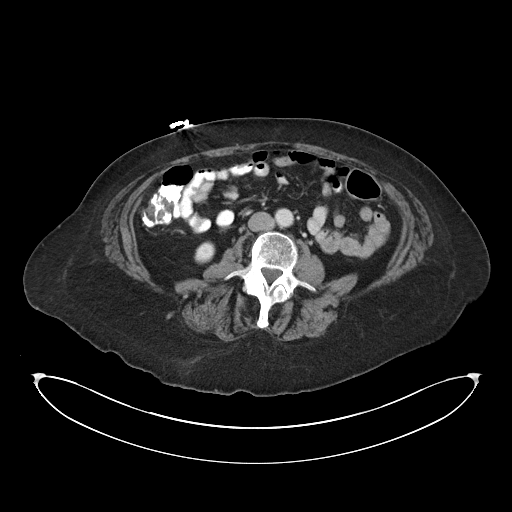
[im 64/100  soft-tissue]
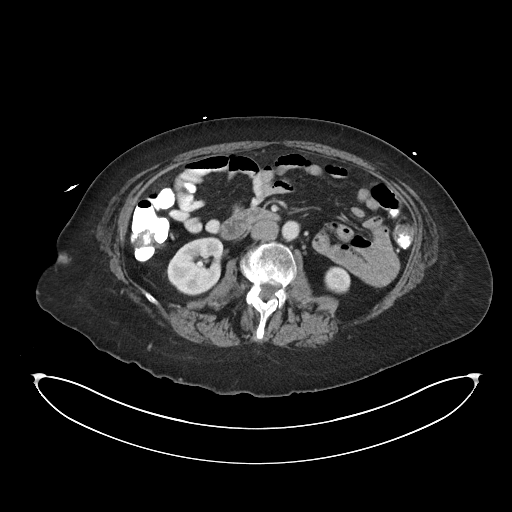
[im 64/100  bone]
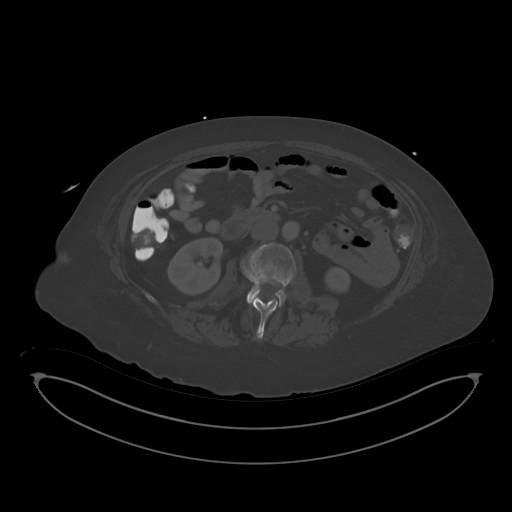
[im 71/100  soft-tissue]
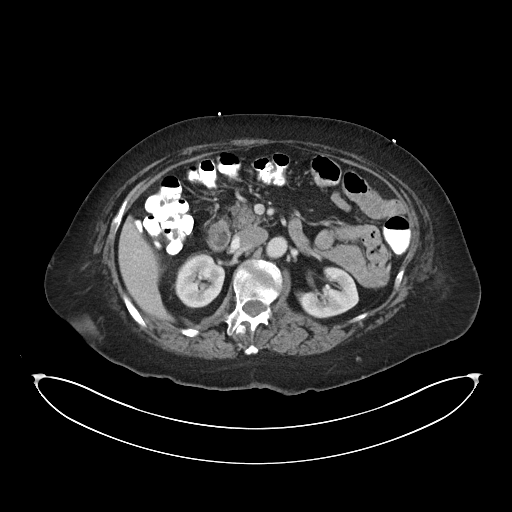
[im 78/100  soft-tissue]
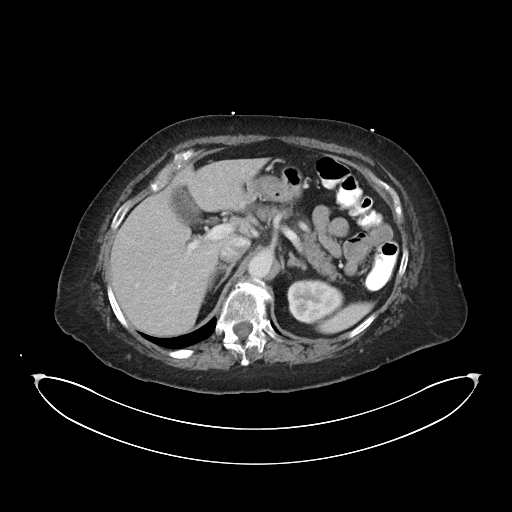
[im 85/100  soft-tissue]
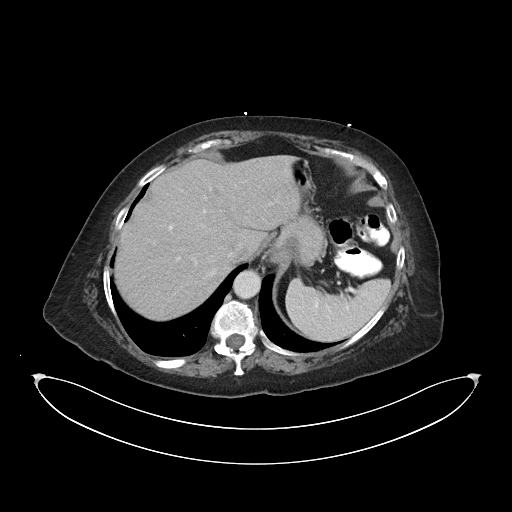
[im 92/100  soft-tissue]
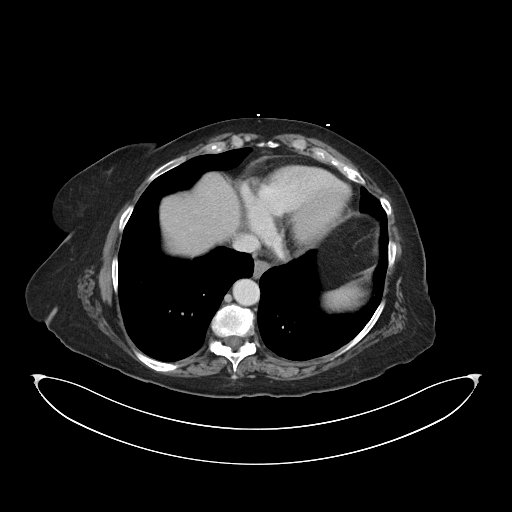

[Series 6: abdomen 3.0 mpr cor · coronal · 1.02mm/px · 3 of 89 slices shown]
[im 30/89  soft-tissue]
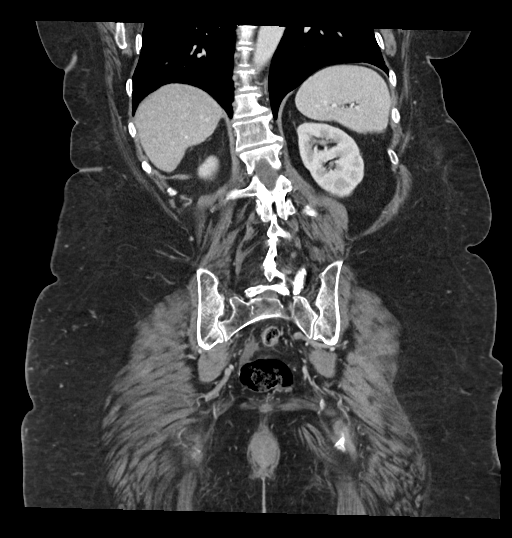
[im 40/89  soft-tissue]
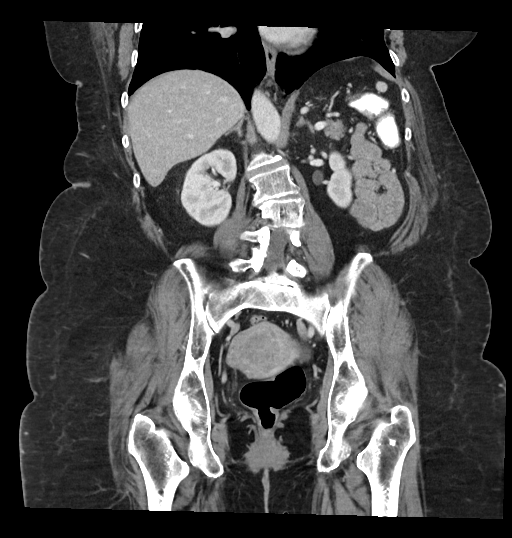
[im 49/89  soft-tissue]
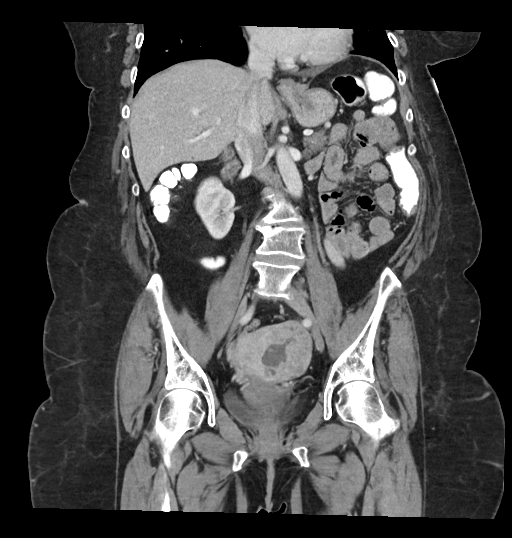

[16 of 46 positions shown; findings below may reference images not displayed]

FINDINGS: Lower chest: No acute abnormality

Hepatobiliary: Gallstones noted within the gallbladder. These
measure up to 1.7 cm. No wall thickening. No biliary ductal
dilatation. No focal hepatic abnormality.

Pancreas: No focal abnormality or ductal dilatation.

Spleen: Spleen is normal size with a craniocaudal length of 7.9 cm.
No focal abnormality.

Adrenals/Urinary Tract: No adrenal abnormality. No focal renal
abnormality. No stones or hydronephrosis. Urinary bladder is
unremarkable.

Stomach/Bowel: Stomach, large and small bowel grossly unremarkable.
Normal appendix.

Vascular/Lymphatic: No evidence of aneurysm or adenopathy.

Reproductive: Enlarged uterus with numerous fibroids. Complex
septated cystic mass in the left adnexal region measuring up to 5
cm. No right adnexal mass.

Other: No free fluid or free air.

Musculoskeletal: No acute bony abnormality. Degenerative changes in
the lumbar spine.
IMPRESSION: Cholelithiasis. No evidence of cholecystitis crash that no evidence
of acute cholecystitis.

Normal spleen size and appearance.

Enlarged fibroid uterus.

Complex septated cystic mass in the left ovary measuring up to 5 cm.
This could be further evaluated with pelvic ultrasound.

## 2020-04-23 SURGERY — ECHOCARDIOGRAM, TRANSESOPHAGEAL
Anesthesia: Monitor Anesthesia Care

## 2020-04-23 SURGERY — LOOP RECORDER INSERTION

## 2020-04-23 MED ORDER — PROPOFOL 500 MG/50ML IV EMUL
INTRAVENOUS | Status: DC | PRN
  Administered 2020-04-23: 100 ug/kg/min via INTRAVENOUS

## 2020-04-23 MED ORDER — SODIUM CHLORIDE 0.9 % IV SOLN: INTRAVENOUS | Status: DC

## 2020-04-23 MED ORDER — IOHEXOL 9 MG/ML PO SOLN
500.0000 mL | ORAL | Status: AC
  Administered 2020-04-23: 500 mL via ORAL

## 2020-04-23 MED ORDER — ONDANSETRON HCL 4 MG/2ML IJ SOLN: 4.0000 mg | Freq: Four times a day (QID) | INTRAMUSCULAR | Status: DC | PRN

## 2020-04-23 MED ORDER — ACETAMINOPHEN 325 MG PO TABS: 325.0000 mg | ORAL_TABLET | ORAL | Status: DC | PRN

## 2020-04-23 MED ORDER — LIDOCAINE-EPINEPHRINE 1 %-1:100000 IJ SOLN
INTRAMUSCULAR | Status: AC
  Filled 2020-04-23: qty 1

## 2020-04-23 MED ORDER — SODIUM CHLORIDE 0.9 % IV SOLN
INTRAVENOUS | Status: AC | PRN
  Administered 2020-04-23: 500 mL via INTRAVENOUS

## 2020-04-23 MED ORDER — IOHEXOL 300 MG/ML  SOLN
100.0000 mL | Freq: Once | INTRAMUSCULAR | Status: AC | PRN
  Administered 2020-04-23: 100 mL via INTRAVENOUS

## 2020-04-23 MED ORDER — LIDOCAINE-EPINEPHRINE 1 %-1:100000 IJ SOLN
INTRAMUSCULAR | Status: DC | PRN
  Administered 2020-04-23: 20 mL

## 2020-04-23 SURGICAL SUPPLY — 2 items
MONITOR REVEAL LINQ II (Prosthesis & Implant Heart) ×1 IMPLANT
PACK LOOP INSERTION (CUSTOM PROCEDURE TRAY) ×2 IMPLANT

## 2020-04-23 NOTE — Consult Note (Addendum)
[place]  [phone] [phone]   I have seen the patient, examined her and agree with the documentation as follows I spoke and reviewed the plan of care with the patient and her daughter  INITIAL HEMATOLOGY CONSULTATION  Referring MD:  Dr. Marzetta Board  Reason for Referral: Thrombocytopenia  HPI: Natalie [name] is a 70 year old female with a past medical history significant for anxiety, depression, hypothyroidism, and recent CVA in January 2022.    She presented with difficulty speaking and diplopia in this hospital stay.  She was also having difficulty thinking.  A code stroke was called.  CT head showed no acute intracranial hemorrhage and no definite acute infarction.  CTA head and neck showed nearly occlusive thrombus at the bifurcation of a proximal M2 MCA branch approximately 6 mm from the MCA bifurcation, subsequent occlusion of a proximal left M3 MCA branch, no stenosis in the neck.  MRI brain without contrast showed patchy small volume acute ischemic nonhemorrhagic posterior left MCA distribution infarcts likely reflecting embolic shower.   She was noted to have thrombocytopenia on admission with a platelet count of 93,000. Platelet count slowly drifting down this admission 93>77>68>77>66>59.  The most recent CBC available to me performed prior to this admission was done on 02/28/2020 and at that time, her platelet count was 108,000.  The patient is receiving several antiplatelet and anticoagulant medications including aspirin 81 mg daily, Plavix 75 mg daily, Lovenox 40 mg daily.  Review of her chart show that the aspirin and Plavix were started at the time of her initial CVA in January 2022.  She tells me that she was off the Plavix for short period time but recently restarted on it just prior to this admission.  The patient underwent TEE earlier today.  Report reviewed and there was no source of embolus but moderate LAE is suggestive of possible  PAF.  Feeling better overall.  She is not having any diplopia today.  Her speech is improving but she still has some slurred speech and difficulty with word finding.  She currently denies bleeding.  She is not having any fevers, chills, headaches, chest pain, shortness of breath, abdominal pain, nausea, vomiting. Hematology was asked to see the patient make recommendations regarding her thrombocytopenia.  Past Medical History:  Diagnosis Date  . Acute bronchospasm due to viral infection   . Anemia    history of  . CVA (cerebral vascular accident) ([place]) 02/28/2020  . Depressive disorder   . Epistaxis   . Fatigue   . Hearing loss   . Hypothyroidism   . Memory change   . Migraine   . Numbness and tingling   . Obesity (BMI 30.0-34.9)   . Osteoarthritis   . Osteoporosis   . Polyuria   . Premature menopause   . Seizures ([place])    history of mini seizures, possible migraine induced  :    Past Surgical History:  Procedure Laterality Date  . ABLATION  2006  . CATARACT EXTRACTION W/ INTRAOCULAR LENS IMPLANT Bilateral   . COLONOSCOPY    . COLONOSCOPY WITH PROPOFOL N/A 12/22/2018   Procedure: COLONOSCOPY WITH PROPOFOL;  Surgeon: Virgel Manifold, MD;  Location: [facility] ENDOSCOPY;  Service: Endoscopy;  Laterality: N/A;  . EYE SURGERY  Spring 2018   Cataracts both eyes  . TONSILLECTOMY    :   CURRENT MEDS: Current Facility-Administered Medications  Medication Dose Route Frequency Provider Last Rate Last Admin  . 0.9 %  sodium chloride  infusion   Intravenous Continuous Dunn, Dayna N, PA-C      . 0.9 %  sodium chloride infusion    Continuous PRN Larey Dresser, MD 20 mL/hr at 04/23/20 1021 500 mL at 04/23/20 1021  . [MAR Hold] acetaminophen (TYLENOL) tablet 650 mg  650 mg Oral Q4H PRN Vashti Hey, MD       Or  . Doug Sou Hold] acetaminophen (TYLENOL) 160 MG/5ML solution 650 mg  650 mg Per Tube Q4H PRN Vashti Hey, MD       Or  . Doug Sou Hold] acetaminophen  (TYLENOL) suppository 650 mg  650 mg Rectal Q4H PRN Vashti Hey, MD      . Doug Sou Hold] aspirin chewable tablet 81 mg  81 mg Oral Daily Bonnell Public Tublu, MD   81 mg at 04/23/20 3532  . [MAR Hold] atorvastatin (LIPITOR) tablet 40 mg  40 mg Oral Daily Bonnell Public Tublu, MD   40 mg at 04/23/20 0940  . [MAR Hold] buPROPion (WELLBUTRIN SR) 12 hr tablet 150 mg  150 mg Oral BID Bonnell Public Tublu, MD   150 mg at 04/23/20 0941  . [MAR Hold] citalopram (CELEXA) tablet 20 mg  20 mg Oral Daily Caren Griffins, MD   20 mg at 04/23/20 0939  . [MAR Hold] clopidogrel (PLAVIX) tablet 75 mg  75 mg Oral Daily Bonnell Public Tublu, MD   75 mg at 04/23/20 0939  . [MAR Hold] enoxaparin (LOVENOX) injection 40 mg  40 mg Subcutaneous Q24H Bonnell Public Tublu, MD   40 mg at 04/22/20 2041  . [MAR Hold] levothyroxine (SYNTHROID) tablet 75 mcg  75 mcg Oral QAC breakfast Vashti Hey, MD   75 mcg at 04/22/20 0543  . [MAR Hold] topiramate (TOPAMAX) tablet 25 mg  25 mg Oral Daily Bonnell Public Tublu, MD   25 mg at 04/23/20 0941      Allergies  Allergen Reactions  . Sulfamethoxazole-Trimethoprim Hives  :  Family History  Problem Relation Age of Onset  . Early death Father        suicide  . Depression Father   . Diabetes Father   . Cancer Father        prostate  . Hearing loss Father        due to war  . Alzheimer's disease Mother   . Heart disease Brother   . Atrial fibrillation Brother   . Heart disease Maternal Aunt   . Dementia Maternal Aunt   . Heart disease Maternal Uncle   . Dementia Maternal Grandmother   . Heart disease Brother   . Atrial fibrillation Brother   :  Social History   Socioeconomic History  . Marital status: Single    Spouse name: Not on file  . Number of children: 1  . Years of education: Not on file  . Highest education level: Bachelor's degree (e.g., BA, AB, BS)  Occupational History  . Occupation: retired    Tobacco Use  . Smoking status: Former Smoker    Packs/day: 0.50    Years: 6.00    Pack years: 3.00    Types: Cigarettes    Quit date: 02/11/1974    Years since quitting: 46.2  . Smokeless tobacco: Never Used  Vaping Use  . Vaping Use: Never used  Substance and Sexual Activity  . Alcohol use: Yes    Alcohol/week: 0.0 standard drinks    Comment: 2-3 drinks monthly  . Drug use: Never  . Sexual activity:  Not Currently    Comment: Don't use not sexually active  Other Topics Concern  . Not on file  Social History Narrative   Raised an adopted child on her own   Working part time reviewed documents   Social Determinants of Health   Financial Resource Strain: Low Risk   . Difficulty of Paying Living Expenses: Not hard at all  Food Insecurity: No Food Insecurity  . Worried About Charity fundraiser in the Last Year: Never true  . Ran Out of Food in the Last Year: Never true  Transportation Needs: No Transportation Needs  . Lack of Transportation (Medical): No  . Lack of Transportation (Non-Medical): No  Physical Activity: Inactive  . Days of Exercise per Week: 0 days  . Minutes of Exercise per Session: 0 min  Stress: No Stress Concern Present  . Feeling of Stress : Not at all  Social Connections: Moderately Integrated  . Frequency of Communication with Friends and Family: More than three times a week  . Frequency of Social Gatherings with Friends and Family: Twice a week  . Attends Religious Services: More than 4 times per year  . Active Member of Clubs or Organizations: Yes  . Attends Archivist Meetings: More than 4 times per year  . Marital Status: Never married  Intimate Partner Violence: Not At Risk  . Fear of Current or Ex-Partner: No  . Emotionally Abused: No  . Physically Abused: No  . Sexually Abused: No  :  REVIEW OF SYSTEMS:  A comprehensive 14 point review of systems was negative except as noted in the HPI.    Exam: Patient Vitals for the past 24  hrs:  BP Temp Temp src Pulse Resp SpO2  04/23/20 1019 (!) 152/97 (!) 97.2 F (36.2 C) Temporal 63 20 100 %  04/23/20 0756 (!) 150/74 98.1 F (36.7 C) Oral 64 17 99 %  04/23/20 0400 (!) 141/75 98.1 F (36.7 C) Oral 62 16 99 %  04/23/20 0007 (!) 141/72 98 F (36.7 C) Oral 64 - 99 %  04/22/20 1940 140/76 98 F (36.7 C) Oral 66 - 99 %  04/22/20 1543 134/81 98.1 F (36.7 C) Oral (!) 58 - 99 %  04/22/20 1112 137/90 98.2 F (36.8 C) Oral 67 - 99 %    General:  well-nourished in no acute distress.   Eyes:  no scleral icterus.   ENT:  There were no oropharyngeal lesions.     Lymphatics:  Negative cervical, supraclavicular or axillary adenopathy.   Respiratory: lungs were clear bilaterally without wheezing or crackles.   Cardiovascular:  Regular rate and rhythm, S1/S2, without murmur, rub or gallop.  There was no pedal edema.   GI: Positive bowel sounds, soft, nontender, I was unable to appreciate any HSM. Musculoskeletal: Strength symmetrical in the upper and lower extremities. Skin: No rashes or petechiae. Noted some minor bruising but no petechiae Neuro exam was nonfocal.  Patient was alert and oriented.  Attention was good.   Language was appropriate.  Mood was normal without depression.      LABS:  Lab Results  Component Value Date   WBC 4.4 04/23/2020   HGB 12.0 04/23/2020   HCT 36.5 04/23/2020   PLT 59 (L) 04/23/2020   GLUCOSE 108 (H) 04/23/2020   CHOL 87 04/20/2020   TRIG 54 04/20/2020   HDL 31 (L) 04/20/2020   LDLCALC 45 04/20/2020   ALT 19 04/23/2020   AST 25 04/23/2020   NA 138  04/23/2020   K 3.7 04/23/2020   CL 106 04/23/2020   CREATININE 0.87 04/23/2020   BUN 11 04/23/2020   CO2 26 04/23/2020   INR 1.1 04/21/2020   HGBA1C 5.0 04/20/2020    CT ANGIOGRAM HEAD NECK W WO CONTRAST  Result Date: 04/19/2020 CLINICAL DATA:  Code stroke follow-up EXAM: CT ANGIOGRAPHY HEAD AND NECK TECHNIQUE: Multidetector CT imaging of the head and neck was performed using the  standard protocol during bolus administration of intravenous contrast. Multiplanar CT image reconstructions and MIPs were obtained to evaluate the vascular anatomy. Carotid stenosis measurements (when applicable) are obtained utilizing NASCET criteria, using the distal internal carotid diameter as the denominator. CONTRAST:  58mL OMNIPAQUE IOHEXOL 350 MG/ML SOLN COMPARISON:  02/29/2020 FINDINGS: CTA NECK Aortic arch: Great vessel origins are patent. Right carotid system: Patent.  No stenosis at the ICA origin. Left carotid system: Patent.  No stenosis at the ICA origin. Vertebral arteries: Patent. Right vertebral artery is dominant. No stenosis. Skeleton: Stable degenerative changes of the cervical spine. Other neck: No new finding. Upper chest: No new finding. Review of the MIP images confirms the above findings CTA HEAD Anterior circulation: Intracranial internal carotid arteries are patent. Left M1 MCA is patent. There is nearly occlusive thrombus within a proximal M2 branch approximately 6 mm from the bifurcation. Subsequent occlusion of a proximal left M3 branch. Right middle and both anterior cerebral arteries are patent. Posterior circulation: Intracranial internal carotid arteries are patent. Basilar artery is patent. Major cerebellar artery origins are patent. Posterior cerebral arteries are patent. Left posterior communicating artery is present. Venous sinuses: Patent as allowed by contrast bolus timing. Review of the MIP images confirms the above findings IMPRESSION: Nearly occlusive thrombus at the bifurcation of a proximal M2 MCA branch approximately 6 mm from the MCA bifurcation. Subsequent occlusion of a proximal left M3 MCA branch. No stenosis in the neck. These results were called by telephone at the time of interpretation on 04/19/2020 at 2:01 pm to provider [facility] , who verbally acknowledged these results. Electronically Signed   By: Macy Mis M.D.   On: 04/19/2020 14:10   MR BRAIN  WO CONTRAST  Result Date: 04/19/2020 CLINICAL DATA:  Initial evaluation for acute stroke. EXAM: MRI HEAD WITHOUT CONTRAST TECHNIQUE: Multiplanar, multiecho pulse sequences of the brain and surrounding structures were obtained without intravenous contrast. COMPARISON:  Prior CTs from earlier the same day as well as previous MRI from 02/28/2020. FINDINGS: Brain: Generalized age appropriate cerebral volume. Mild chronic microvascular ischemic disease again noted. Patchy small volume foci of restricted diffusion seen involving the cortical and subcortical aspect of the posterior left frontoparietal and temporal region, consistent with posterior left MCA distribution infarcts. For reference purposes, the largest of these foci seen at the posterior left temporal region and measures 8 mm. Findings are embolic in distribution. No associated hemorrhage or mass effect. Additional apparent subcentimeter focus of mild diffusion abnormality at the posterior right frontoparietal centrum semi ovale favored to reflect T2 shine through (series 2, image 34). No other evidence for acute or subacute ischemia. Gray-white matter differentiation otherwise maintained. No acute intracranial hemorrhage. Small focus of susceptibility artifact at the posterior right temporal region noted, consistent with a small chronic microhemorrhage. Additional minimal residual chronic blood products noted about the occipital regions related to recently identified ischemic infarcts. No mass lesion, midline shift or mass effect. No hydrocephalus or extra-axial fluid collection. Pituitary gland suprasellar region within normal limits. Midline structures intact. Vascular: Hypoplastic left vertebral  artery not well seen. Major intracranial vascular flow voids are otherwise maintained. Skull and upper cervical spine: Craniocervical junction within normal limits. Bone marrow signal intensity normal. No focal marrow replacing lesion. No scalp soft tissue  abnormality. Sinuses/Orbits: Patient status post bilateral ocular lens replacement. Globes and orbital soft tissues demonstrate no acute finding. Paranasal sinuses are largely clear. No significant mastoid effusion. Inner ear structures grossly normal. Other: None. IMPRESSION: 1. Patchy small volume acute ischemic nonhemorrhagic posterior left MCA distribution infarcts, likely reflecting an embolic shower. No associated hemorrhage or mass effect. 2. No other acute intracranial abnormality. 3. Underlying mild chronic microvascular ischemic disease. Electronically Signed   By: Jeannine Boga M.D.   On: 04/19/2020 23:39   CT HEAD CODE STROKE WO CONTRAST  Addendum Date: 04/19/2020   ADDENDUM REPORT: 04/19/2020 13:56 ADDENDUM: Under ASPECT score calculation there is a dictation error. Should state 7 rather than 6. Electronically Signed   By: Macy Mis M.D.   On: 04/19/2020 13:56   Result Date: 04/19/2020 CLINICAL DATA:  Code stroke. EXAM: CT HEAD WITHOUT CONTRAST TECHNIQUE: Contiguous axial images were obtained from the base of the skull through the vertex without intravenous contrast. COMPARISON:  None. FINDINGS: Brain: No acute intracranial hemorrhage, mass effect, or edema. No definite new loss of gray-white differentiation. Ventricles and sulci are normal in size and configuration. No extra-axial collection. Vascular: Focal density along a left M2 MCA branch within the sylvian fissure. Skull: Unremarkable. Sinuses/Orbits: No acute finding. Other: Mastoid air cells are clear. ASPECTS [facility] Stroke Program Early CT Score) - Ganglionic level infarction (caudate, lentiform nuclei, internal capsule, insula, M1-M3 cortex): 6 - Supraganglionic infarction (M4-M6 cortex): 3 Total score (0-10 with 10 being normal): 9 IMPRESSION: No acute intracranial hemorrhage.  No definite acute infarction. Possible dense left M2 MCA branch within the sylvian fissure. CTA pending. These results were called by telephone at  the time of interpretation on 04/19/2020 at 1:42 pm to provider Lavonia Drafts , who verbally acknowledged these results. Electronically Signed: By: Macy Mis M.D. On: 04/19/2020 13:46   VAS [place] LOWER EXTREMITY VENOUS (DVT)  Result Date: 04/22/2020  Lower Venous DVT Study Indications: Stroke.  Comparison Study: No prior study Performing Technologist: Sharion Dove RVS  Examination Guidelines: A complete evaluation includes B-mode imaging, spectral Doppler, color Doppler, and power Doppler as needed of all accessible portions of each vessel. Bilateral testing is considered an integral part of a complete examination. Limited examinations for reoccurring indications may be performed as noted. The reflux portion of the exam is performed with the patient in reverse Trendelenburg.  +---------+---------------+---------+-----------+----------+--------------+ RIGHT    CompressibilityPhasicitySpontaneityPropertiesThrombus Aging +---------+---------------+---------+-----------+----------+--------------+ CFV      Full           Yes      Yes                                 +---------+---------------+---------+-----------+----------+--------------+ SFJ      Full                                                        +---------+---------------+---------+-----------+----------+--------------+ FV Prox  Full                                                        +---------+---------------+---------+-----------+----------+--------------+  FV Mid   Full                                                        +---------+---------------+---------+-----------+----------+--------------+ FV DistalFull                                                        +---------+---------------+---------+-----------+----------+--------------+ PFV      Full                                                        +---------+---------------+---------+-----------+----------+--------------+ POP      Full            Yes      Yes                                 +---------+---------------+---------+-----------+----------+--------------+ PTV      Full                                                        +---------+---------------+---------+-----------+----------+--------------+ PERO     Full                                                        +---------+---------------+---------+-----------+----------+--------------+   +---------+---------------+---------+-----------+----------+--------------+ LEFT     CompressibilityPhasicitySpontaneityPropertiesThrombus Aging +---------+---------------+---------+-----------+----------+--------------+ CFV      Full           Yes      Yes                                 +---------+---------------+---------+-----------+----------+--------------+ SFJ      Full                                                        +---------+---------------+---------+-----------+----------+--------------+ FV Prox  Full                                                        +---------+---------------+---------+-----------+----------+--------------+ FV Mid   Full                                                        +---------+---------------+---------+-----------+----------+--------------+  FV DistalFull                                                        +---------+---------------+---------+-----------+----------+--------------+ PFV      Full                                                        +---------+---------------+---------+-----------+----------+--------------+ POP      Full           Yes      Yes                                 +---------+---------------+---------+-----------+----------+--------------+ PTV      Full                                                        +---------+---------------+---------+-----------+----------+--------------+ PERO     Full                                                         +---------+---------------+---------+-----------+----------+--------------+     Summary: BILATERAL: - No evidence of deep vein thrombosis seen in the lower extremities, bilaterally. -No evidence of popliteal cyst, bilaterally.   *See table(s) above for measurements and observations. Electronically signed by Ruta Hinds MD on 04/22/2020 at 11:21:13 AM.    Final    US Abdomen Limited RUQ (LIVER/GB)  Result Date: 04/20/2020 CLINICAL DATA:  Thrombocytopenia.  Inpatient. EXAM: ULTRASOUND ABDOMEN LIMITED RIGHT UPPER QUADRANT COMPARISON:  None. FINDINGS: Gallbladder: Numerous layering shadowing gallstones in the gallbladder, largest 1.4 cm. No gallbladder wall thickening. No pericholecystic fluid. No sonographic Murphy's sign. Common bile duct: Diameter: 6 mm Liver: Top-normal liver parenchymal echogenicity. No liver masses. Portal vein is patent on color Doppler imaging with normal direction of blood flow towards the liver. Other: None. IMPRESSION: 1. Cholelithiasis.  No evidence of acute cholecystitis. 2. Top normal caliber common bile duct (6 mm diameter). Suggest correlation with serum bilirubin levels. MRI abdomen with MRCP may be considered as clinically warranted. 3. Normal liver. Electronically Signed   By: Ilona Sorrel M.D.   On: 04/20/2020 13:21   I have reviewed her peripheral blood smear.  Occasional platelet clumping is noted.  Reduced platelet count overall.  Normal morphology of red blood cells.  No evidence of schistocytes.  Normal morphology of white blood cells  ASSESSMENT AND PLAN: Acute on chronic thrombocytopenia She was noted to have mild thrombocytopenia on admission but has had a continued slow decline in her platelet count this admission  She is not actively bleeding Recommend CT abdomen/pelvis to evaluate for splenomegaly DIC panel performed on 3/18 did not show any evidence of schistocytes Overall, the acute drop is reflective of consumption due to formation of large  amount of clot  recently. No need for transfusion at this time, observe There is no contraindication to remain on antiplatelet agents or anticoagulants as long as the platelet is greater than 50,000.  Acute left MCA infarcts with nearly occlusive thrombus in the proximal M2 and M3 branches Neurology has been consulted and is following the patient Okay to continue aspirin, Plavix, Lovenox unless platelet count drops below 50,000 or active bleeding  Hyperlipidemia Continue statin per primary  Hypothyroidism Continue Synthroid per primary   Thank you for this referral.  Mikey Bussing, DNP, AGPCNP-BC, AOCNP  [name], MD

## 2020-04-23 NOTE — Interval H&P Note (Signed)
History and Physical Interval Note:  04/23/2020 2:51 PM  Natalie [name]  has presented today for surgery, with the diagnosis of Cryptogenic stroke.  The various methods of treatment have been discussed with the patient and family. After consideration of risks, benefits and other options for treatment, the patient has consented to  Procedure(s): LOOP RECORDER INSERTION (N/A) as a surgical intervention.  [name] history has been reviewed, patient examined, no change in status, stable for surgery.  I have reviewed the patient's chart and labs.  Questions were answered to the patient's satisfaction.     Cristopher Peru

## 2020-04-23 NOTE — Progress Notes (Signed)
Pt back on floor at 1219.

## 2020-04-23 NOTE — Transfer of Care (Signed)
Immediate Anesthesia Transfer of Care Note  Patient: Natalie [name]  Procedure(s) Performed: TRANSESOPHAGEAL ECHOCARDIOGRAM (TEE) (N/A ) BUBBLE STUDY  Patient Location: Endoscopy Unit  Anesthesia Type:MAC  Level of Consciousness: awake, alert  and sedated  Airway & Oxygen Therapy: Patient connected to nasal cannula oxygen  Post-op Assessment: Post -op Vital signs reviewed and stable  Post vital signs: stable  Last Vitals:  Vitals Value Taken Time  BP    Temp    Pulse    Resp    SpO2      Last Pain:  Vitals:   04/23/20 1019  TempSrc: Temporal  PainSc: 0-No pain         Complications: No complications documented.

## 2020-04-23 NOTE — Anesthesia Procedure Notes (Signed)
Procedure Name: MAC Date/Time: 04/23/2020 11:27 AM Performed by: Moshe Salisbury, CRNA Pre-anesthesia Checklist: Patient identified, Emergency Drugs available, Suction available, Patient being monitored and Timeout performed Patient Re-evaluated:Patient Re-evaluated prior to induction Oxygen Delivery Method: Nasal cannula Preoxygenation: Pre-oxygenation with 100% oxygen Induction Type: IV induction Placement Confirmation: breath sounds checked- equal and bilateral and positive ETCO2 Dental Injury: Teeth and Oropharynx as per pre-operative assessment

## 2020-04-23 NOTE — Progress Notes (Signed)
Pt left room with OR/cath lab to receive loop recorder at 1500

## 2020-04-23 NOTE — Progress Notes (Signed)
  Echocardiogram Echocardiogram Transesophageal with color, spectra, and bubble study has been performed.  Natalie [name] M 04/23/2020, 11:45 AM

## 2020-04-23 NOTE — Anesthesia Preprocedure Evaluation (Signed)
Anesthesia Evaluation  Patient identified by MRN, date of birth, ID band Patient awake    Reviewed: Allergy & Precautions, NPO status , Patient's Chart, lab work & pertinent test results  History of Anesthesia Complications Negative for: history of anesthetic complications  Airway Mallampati: III  TM Distance: >3 FB Neck ROM: Full    Dental  (+) Dental Advisory Given, Teeth Intact   Pulmonary neg COPD, former smoker,  Covid-19 Nucleic Acid Test Results Lab Results      Component                Value               Date                      SARSCOV2NAA              NEGATIVE            04/19/2020                [place]              NEGATIVE            02/28/2020                [place]              NEGATIVE            12/17/2018              breath sounds clear to auscultation       Cardiovascular  Rhythm:Regular  1. Left ventricular ejection fraction, by estimation, is 60 to 65%. The  left ventricle has normal function. The left ventricle has no regional  wall motion abnormalities. Left ventricular diastolic parameters were  normal.  2. Right ventricular systolic function is normal. The right ventricular  size is normal.  3. The mitral valve is grossly normal. Trivial mitral valve  regurgitation.  4. The aortic valve is grossly normal. Aortic valve regurgitation is  trivial.    Neuro/Psych Seizures -,  PSYCHIATRIC DISORDERS Depression CVA, No Residual Symptoms    GI/Hepatic negative GI ROS, Neg liver ROS,   Endo/Other  Hypothyroidism   Renal/GU Lab Results      Component                Value               Date                      CREATININE               0.87                04/23/2020                Musculoskeletal  (+) Arthritis ,   Abdominal   Peds  Hematology  (+) Blood dyscrasia, , Lab Results      Component                Value               Date                      WBC                       4.4  04/23/2020                HGB                      12.0                04/23/2020                HCT                      36.5                04/23/2020                MCV                      94.6                04/23/2020                PLT                      59 (L)              04/23/2020              Anesthesia Other Findings   Reproductive/Obstetrics                             Anesthesia Physical Anesthesia Plan  ASA: III  Anesthesia Plan: MAC   Post-op Pain Management:    Induction: Intravenous  PONV Risk Score and Plan: 2 and Propofol infusion and Treatment may vary due to age or medical condition  Airway Management Planned: Nasal Cannula  Additional Equipment: None  Intra-op Plan:   Post-operative Plan:   Informed Consent: I have reviewed the patients History and Physical, chart, labs and discussed the procedure including the risks, benefits and alternatives for the proposed anesthesia with the patient or authorized representative who has indicated his/her understanding and acceptance.     Dental advisory given  Plan Discussed with: CRNA and Surgeon  Anesthesia Plan Comments:         Anesthesia Quick Evaluation

## 2020-04-23 NOTE — Progress Notes (Addendum)
STROKE TEAM PROGRESS NOTE   INTERVAL HISTORY No acute events overnight.  She reports that she tolerated the procedure well and that she does not even have much of a sore throat. She is a little disappointed that she will be here for another day as she is getting a Splenic CT. However, she understands the need for it and is willing to continue to stay.  TEE was done today and showed - No source of embolus, though moderate LAE is suggestive of possible PAF.  Hospitalist Service is Primary. Cardiology is following. Oncology has been consulted.  Vitals:   04/22/20 1940 04/23/20 0007 04/23/20 0400 04/23/20 0756  BP: 140/76 (!) 141/72 (!) 141/75 (!) 150/74  Pulse: 66 64 62 64  Resp:   16 17  Temp: 98 F (36.7 C) 98 F (36.7 C) 98.1 F (36.7 C) 98.1 F (36.7 C)  TempSrc: Oral Oral Oral Oral  SpO2: 99% 99% 99% 99%   CBC:  Recent Labs  Lab 04/19/20 1322 04/19/20 2025 04/21/20 0100 04/22/20 0323  WBC 8.1   < > 7.1 7.6  NEUTROABS 5.7  --   --   --   HGB 13.3   < > 11.5* 11.6*  HCT 39.8   < > 35.1* 35.3*  MCV 94.3   < > 95.1 94.9  PLT 93*   < > 77* 66*   < > = values in this interval not displayed.   Basic Metabolic Panel:  Recent Labs  Lab 04/21/20 0100 04/22/20 0323  NA 139 137  K 3.3* 3.7  CL 108 107  CO2 25 21*  GLUCOSE 90 90  BUN 11 12  CREATININE 0.87 0.91  CALCIUM 8.6* 8.7*  MG 2.0  --   PHOS 3.0  --    Lipid Panel:  Recent Labs  Lab 04/20/20 0355  CHOL 87  TRIG 54  HDL 31*  CHOLHDL 2.8  VLDL 11  LDLCALC 45   HgbA1c:  Recent Labs  Lab 04/20/20 0355  HGBA1C 5.0   Urine Drug Screen:  Recent Labs  Lab 04/19/20 1322  LABOPIA NONE DETECTED  COCAINSCRNUR NONE DETECTED  LABBENZ NONE DETECTED  AMPHETMU NONE DETECTED  THCU NONE DETECTED  LABBARB NONE DETECTED    Alcohol Level  Recent Labs  Lab 04/19/20 1322  ETH <10    IMAGING past 24 hours No results found.  PHYSICAL EXAM Blood pressure (!) 150/74, pulse 64, temperature 98.1 F (36.7  C), temperature source Oral, resp. rate 17, SpO2 99 %.  General: alert and awake, elderly caucasian female, no apparent distress  Lungs: Symmetrical Chest rise, no labored breathing  Cardio: Regular Rate and Rhythm  Abdomen: Soft, non-tender  Neuro: Alert, oriented, thought content appropriate. Speech fluent without evidence of aphasia. Able to follow allcommands without difficulty. Cranial Nerves: II: Visual fields grossly normal, pupils equal, round, reactive to light and accommodation III,IV, VI: ptosis not present, extra-ocular motions intact bilaterally V,VII: smile symmetric, facial light touch sensation normal bilaterally VIII: hearing normal bilaterally IX,X: uvula rises symmetrically XI: bilateral shoulder shrug XII: midline tongue extension without atrophy or fasciculations  Motor: Right :Upper extremity5/5Left: Upper extremity 5/5 Lower extremity 5/5Lower extremity 5/5 Tone and bulk:normal tone throughout; no atrophy noted Sensory: light touch intact throughout, bilaterally Cerebellar: normal finger-to-nose Gait:deferred    ASSESSMENT/PLAN [name] is a 70 y.o. female with history of anxiety and depression and hypothyroidism who had a stroke 3 months ago in January 2022 when she presented with diplopiapresenting with  difficulty speaking.She describes the event aswalking into kitchenwith acupin her hand and suddenlydropped it.Shestared atcupwhilethinking "...did not know what to do"which is out of character for her. On arrival to ED she had difficulty speaking however symptoms have since completely resolved. She reports her symptoms have gradually improved although she still has some difficulty speaking. She denies extremity weakness.  TEE done today and showed - No source of embolus, though moderate LAE is suggestive of  possible PAF. Given her platelet count Oncology has been consulted. Will get Splenic CT. Will still not start Brilinta as her platelets are still low, today they are at 59.  Stroke - acute left MCA infarcts with a nearly occlusive thrombus in the proximal M2 and M3 branchesmost likelyembolus from unclear source  CT negative for acute finding  CT head and neck showed left M2/M3 occlusion.    MRI showed left MCA patchy infarct with embolic shower pattern.    DVT negative.    2D echo EF 60 to 65% 02/2020    TEE - No source of embolus, though moderate LAE is suggestive of potential possibility of PAF.  Loop recorder placed  LDL45  HgbA1c5.0  VTE prophylaxis -Lovenox  aspirin 81 mg dailyprior to admission, now on aspirin 81 mg daily and Plavix 75 DAPT.  Dr. Leonie Man recommended aspirin and the Brilinta for 30 days and then Plavix alone.  Will follow platelet level, once platelet>100, will start Brilinta  Therapy recommendations: Outpatient PT/OT  Disposition:Pending  Hx of stroke  02/2020 admitted with diplopia and dizziness.  MRI showed a bilateral subacute scattered cortical subcortical infarct in the right occipital infarct.  EF 60 to 65%.  CT head and neck negative.  Put on DAPT and Lipitor 40.    Had 30-day cardiac event monitoring as outpatient, report pending.  Thrombocytopenia  Platelet 68-> 77->66->59  US liver unremarkable  Continue monitoring  On aspirin and Plavix now.  Consider to start aspirin and Brilinta once platelet >100  Hematology consulted and appreciate help  DIC panel 3/18 did not show any schistocytes  CT A/P pending  Hypertension  Home meds:None  Stable  Long-term BP goal normotensive  Hyperlipidemia  Home meds:Lipitor,resumed in hospital  LDL 45, goal < 70  On Lipitor 40 mg daily  Continue statin at discharge  Other Stroke Risk Factors  Advanced Age >/= 91  FormerCigarette smoker  Obesity, BMI 33.89,  associated with increased stroke risk, recommend weight loss, diet and exercise as appropriate   Migraines  Other Active Problems  Hypothyroidism - Synthroid 75 mcg daily   Hospital day # [place] MD Resident  ATTENDING NOTE: I reviewed above note and agree with the assessment and plan. Pt was seen and examined.   Family at bedside.  Patient sitting in bed for lunch, no complaints, neuro intact.  Had TEE today showed no source of embolus, but moderate LAE.  Loop recorder in place.  Patient continues to have worsening thrombocytopenia, hematology consulted, no schistocytes on DIC panel, recommend CT abdomen pelvis.  Okay to continue aspirin Plavix as well as platelet more than 50.  Will follow.  For detailed assessment and plan, please refer to above as I have made changes wherever appropriate.   [name], MD PhD Stroke Neurology 04/23/2020 5:00 PM     To contact Stroke Continuity provider, please refer to [email or web address] After hours, contact General Neurology

## 2020-04-23 NOTE — CV Procedure (Signed)
Procedure: TEE  Indication: CVA  Sedation: Per anesthesiology  Findings: Please see echo section for full report.  Normal LV size and wall thickness.  EF 55-60%, normal wall motion.  Normal RV size and systolic function.  Moderate left atrial enlargement, no LA appendage thrombus.  Normal right atrium.  No PFO or ASD by color doppler or bubble study.  Trivial TR.  Trivial PI.  Trileaflet aortic valve, no stenosis and trivial AI.  Trivial MR.  Normal caliber thoracic aorta with minimal plaque.   Impression: No source of embolus, though moderate LAE is suggestive of possible PAF.   Natalie [name] 04/23/2020 11:26 AM

## 2020-04-23 NOTE — Interval H&P Note (Signed)
History and Physical Interval Note:  04/23/2020 11:16 AM  Natalie [name]  has presented today for surgery, with the diagnosis of STROKE.  The various methods of treatment have been discussed with the patient and family. After consideration of risks, benefits and other options for treatment, the patient has consented to  Procedure(s): TRANSESOPHAGEAL ECHOCARDIOGRAM (TEE) (N/A) as a surgical intervention.  [name] history has been reviewed, patient examined, no change in status, stable for surgery.  I have reviewed the patient's chart and labs.  Questions were answered to the patient's satisfaction.     Gedeon Brandow [employer]

## 2020-04-23 NOTE — Progress Notes (Addendum)
Electrophysiology Rounding Note  Patient Name: Natalie [name] Date of Encounter: 04/23/2020  Primary Cardiologist: No primary care provider on file. Electrophysiologist: New   Subjective   The patient is doing well today.  At this time, the patient denies chest pain, shortness of breath, or any new concerns.  Inpatient Medications    Scheduled Meds:  aspirin  81 mg Oral Daily   atorvastatin  40 mg Oral Daily   buPROPion  150 mg Oral BID   citalopram  20 mg Oral Daily   clopidogrel  75 mg Oral Daily   enoxaparin (LOVENOX) injection  40 mg Subcutaneous Q24H   levothyroxine  75 mcg Oral QAC breakfast   topiramate  25 mg Oral Daily   Continuous Infusions:   PRN Meds: acetaminophen **OR** acetaminophen (TYLENOL) oral liquid 160 mg/5 mL **OR** acetaminophen   Vital Signs    Vitals:   04/22/20 1940 04/23/20 0007 04/23/20 0400 04/23/20 0756  BP: 140/76 (!) 141/72 (!) 141/75 (!) 150/74  Pulse: 66 64 62 64  Resp:   16 17  Temp: 98 F (36.7 C) 98 F (36.7 C) 98.1 F (36.7 C) 98.1 F (36.7 C)  TempSrc: Oral Oral Oral Oral  SpO2: 99% 99% 99% 99%   No intake or output data in the 24 hours ending 04/23/20 0855 There were no vitals filed for this visit.  Physical Exam    GEN- The patient is well appearing, alert and oriented x 3 today.   Head- normocephalic, atraumatic Eyes-  Sclera clear, conjunctiva pink Ears- hearing intact Oropharynx- clear Neck- supple Lungs- Clear to ausculation bilaterally, normal work of breathing Heart- Regular rate and rhythm, no murmurs, rubs or gallops GI- soft, NT, ND, + BS Extremities- no clubbing or cyanosis. No edema Skin- no rash or lesion Psych- euthymic mood, full affect Neuro- strength and sensation are intact  Labs    CBC Recent Labs    04/21/20 0100 04/22/20 0323  WBC 7.1 7.6  HGB 11.5* 11.6*  HCT 35.1* 35.3*  MCV 95.1 94.9  PLT 77* 66*   Basic Metabolic Panel Recent Labs    04/21/20 0100 04/22/20 0323  NA  139 137  K 3.3* 3.7  CL 108 107  CO2 25 21*  GLUCOSE 90 90  BUN 11 12  CREATININE 0.87 0.91  CALCIUM 8.6* 8.7*  MG 2.0  --   PHOS 3.0  --    Liver Function Tests Recent Labs    04/21/20 0100  AST 18  ALT 14  ALKPHOS 138*  BILITOT 0.8  PROT 5.9*  ALBUMIN 3.1*   No results for input(s): LIPASE, AMYLASE in the last 72 hours. Cardiac Enzymes No results for input(s): CKTOTAL, CKMB, CKMBINDEX, TROPONINI in the last 72 hours.   Telemetry    NSR 60s (personally reviewed)  Radiology    VAS [place] LOWER EXTREMITY VENOUS (DVT)  Result Date: 04/22/2020  Lower Venous DVT Study Indications: Stroke.  Comparison Study: No prior study Performing Technologist: Sharion Dove RVS  Examination Guidelines: A complete evaluation includes B-mode imaging, spectral Doppler, color Doppler, and power Doppler as needed of all accessible portions of each vessel. Bilateral testing is considered an integral part of a complete examination. Limited examinations for reoccurring indications may be performed as noted. The reflux portion of the exam is performed with the patient in reverse Trendelenburg.  +---------+---------------+---------+-----------+----------+--------------+ RIGHT    CompressibilityPhasicitySpontaneityPropertiesThrombus Aging +---------+---------------+---------+-----------+----------+--------------+ CFV      Full  Yes      Yes                                 +---------+---------------+---------+-----------+----------+--------------+ SFJ      Full                                                        +---------+---------------+---------+-----------+----------+--------------+ FV Prox  Full                                                        +---------+---------------+---------+-----------+----------+--------------+ FV Mid   Full                                                         +---------+---------------+---------+-----------+----------+--------------+ FV DistalFull                                                        +---------+---------------+---------+-----------+----------+--------------+ PFV      Full                                                        +---------+---------------+---------+-----------+----------+--------------+ POP      Full           Yes      Yes                                 +---------+---------------+---------+-----------+----------+--------------+ PTV      Full                                                        +---------+---------------+---------+-----------+----------+--------------+ PERO     Full                                                        +---------+---------------+---------+-----------+----------+--------------+   +---------+---------------+---------+-----------+----------+--------------+ LEFT     CompressibilityPhasicitySpontaneityPropertiesThrombus Aging +---------+---------------+---------+-----------+----------+--------------+ CFV      Full           Yes      Yes                                 +---------+---------------+---------+-----------+----------+--------------+ SFJ  Full                                                        +---------+---------------+---------+-----------+----------+--------------+ FV Prox  Full                                                        +---------+---------------+---------+-----------+----------+--------------+ FV Mid   Full                                                        +---------+---------------+---------+-----------+----------+--------------+ FV DistalFull                                                        +---------+---------------+---------+-----------+----------+--------------+ PFV      Full                                                         +---------+---------------+---------+-----------+----------+--------------+ POP      Full           Yes      Yes                                 +---------+---------------+---------+-----------+----------+--------------+ PTV      Full                                                        +---------+---------------+---------+-----------+----------+--------------+ PERO     Full                                                        +---------+---------------+---------+-----------+----------+--------------+     Summary: BILATERAL: - No evidence of deep vein thrombosis seen in the lower extremities, bilaterally. -No evidence of popliteal cyst, bilaterally.   *See table(s) above for measurements and observations. Electronically signed by Ruta Hinds MD on 04/22/2020 at 11:21:13 AM.    Final     Patient Profile     Natalie [name] is a 70 y.o. female with a hx of stroke who is being seen today for the evaluation of cryptogenic stroke at the request of Dr. Cruzita Lederer.  Assessment & Plan    Cryptogenic stroke - the etiology of her stroke is unclear.  -She wore a holter monitor but these results are not available  in [place]. She has reached out to the office that prescribed it. We will try to obtain the results. If no atrial fib and the TEE shows no evidence of the etiology of her stroke, then ILR insertion will be recommended. She has been consented.   For questions or updates, please contact [place] Please consult [email or web address] for contact info under Cardiology/STEMI.  Signed, Shirley Friar, PA-C  04/23/2020, 8:55 AM   EP Attending  Patient seen and examined. Agree with the above note. She has had a cryptogenic stroke. She has had a TEE demonstrating no explaination for her stroke. We have reached out to Dr. Etta Quill office to get the results of her 30 day monitor. We could not get thru. She will undergo insertion of an ILR.   Carleene Overlie Rollen Selders,MD

## 2020-04-23 NOTE — Plan of Care (Signed)
  Problem: Education: Goal: Knowledge of General Education information will improve Description: Including pain rating scale, medication(s)/side effects and non-pharmacologic comfort measures Outcome: Progressing   Problem: Health Behavior/Discharge Planning: Goal: Ability to manage health-related needs will improve Outcome: Progressing   Problem: Clinical Measurements: Goal: Will remain free from infection Outcome: Progressing Goal: Cardiovascular complication will be avoided Outcome: Progressing   Problem: Activity: Goal: Risk for activity intolerance will decrease Outcome: Progressing   Problem: Nutrition: Goal: Adequate nutrition will be maintained Outcome: Progressing   Problem: Coping: Goal: Level of anxiety will decrease Outcome: Progressing   Problem: Safety: Goal: Ability to remain free from injury will improve Outcome: Progressing   Problem: Skin Integrity: Goal: Risk for impaired skin integrity will decrease Outcome: Progressing   Problem: Education: Goal: Knowledge of disease or condition will improve Outcome: Progressing   Problem: Coping: Goal: Will verbalize positive feelings about self Outcome: Progressing

## 2020-04-23 NOTE — Progress Notes (Signed)
Pt returned to room at 1532

## 2020-04-23 NOTE — Progress Notes (Signed)
PROGRESS NOTE  Natalie [name] BJS:[id number] DOB: 05-31-1950 DOA: 04/19/2020 PCP: [name], MD   LOS: 3 days   Brief Narrative / Interim history: Natalie [name] is an 70 y.o. female with PMH significant for anxiety and depression and hypothyroidism who had a stroke 3 months ago in January 2022 when she presented with diplopia.  At that time the diplopia resolved and patient was placed on aspirin and atorvastatin.  Last week she had another episode of diplopia which lasted 1 to 2 minutes and resolve spontaneously.  The day prior to this admission patient developed difficulty speaking and thinking.  She also felt somewhat confused.  Apparently her symptoms wax and wane while in the ED. MRI showed patchy small volume acute ischemic nonhemorrhagic posterior left MCA distribution infarcts, possibly embolic shower.  CTA revealed a nearly occlusive thrombus of the left proximal M2 branch and subsequent occlusion of proximal left M3 branch.  Neurology was consulted and she was admitted to the hospital.  Subjective / 24h Interval events: Feels well this morning.  Her speech is much better but she appreciated still not back to normal.  No fever no chills.  No bleeding noted  Assessment & Plan: Principal Problem Acute left MCA infarcts with a nearly occlusive thrombus in the proximal M2 and M3 branches -Neurology consulted and following.  Discussed with Dr. Erlinda Hong.  Continue aspirin and Plavix.  Lipid panel showed an LDL of 108.  She is on statin.  A1c 5.0.  Lower extremity Dopplers negative for DVT.  TEE and loop recorder today  Active Problems Thrombocytopenia -Patient has no history of thrombocytopenia.  In January 2022 with her prior stroke her platelets were mildly decreased at 108.  Her platelets are slowly getting worse even today.  Consulted hematology, discussed with Dr. Alvy Bimler.  Obtain CT scan abdomen pelvis to rule out splenomegaly.  Hyperlipidemia -Continue  statin  Hypothyroidism -Continue Synthroid  Scheduled Meds: . [MAR Hold] aspirin  81 mg Oral Daily  . [MAR Hold] atorvastatin  40 mg Oral Daily  . [MAR Hold] buPROPion  150 mg Oral BID  . [MAR Hold] citalopram  20 mg Oral Daily  . [MAR Hold] clopidogrel  75 mg Oral Daily  . [MAR Hold] enoxaparin (LOVENOX) injection  40 mg Subcutaneous Q24H  . [MAR Hold] levothyroxine  75 mcg Oral QAC breakfast  . [MAR Hold] topiramate  25 mg Oral Daily   Continuous Infusions: . sodium chloride    . sodium chloride 500 mL (04/23/20 1021)   PRN Meds:.sodium chloride, [MAR Hold] acetaminophen **OR** [MAR Hold] acetaminophen (TYLENOL) oral liquid 160 mg/5 mL **OR** [MAR Hold] acetaminophen  Diet Orders (From admission, onward)    Start     Ordered   04/23/20 0001  Diet NPO time specified Except for: Sips with Meds  Diet effective ____       Comments: Patient to remain NPO until fully awake following the TEE.  Question:  Except for  Answer:  Ferrel Logan with Meds   04/20/20 1751          DVT prophylaxis: enoxaparin (LOVENOX) injection 40 mg Start: 04/19/20 2100     Code Status: Full Code  Family Communication: No family at bedside  Status is: Inpatient  inpatient because: Inpatient level of care appropriate due to severity of illness  Dispo: The patient is from: Home              Anticipated d/c is to: Home  Patient currently is not medically stable to d/c.   Difficult to place patient No  Level of care: Telemetry Medical  Consultants:  Neurology   Procedures:  2D echo: Normal EF 60-65%,  Microbiology  none  Antimicrobials: none    Objective: Vitals:   04/23/20 0007 04/23/20 0400 04/23/20 0756 04/23/20 1019  BP: (!) 141/72 (!) 141/75 (!) 150/74 (!) 152/97  Pulse: 64 62 64 63  Resp:  16 17 20   Temp: 98 F (36.7 C) 98.1 F (36.7 C) 98.1 F (36.7 C) (!) 97.2 F (36.2 C)  TempSrc: Oral Oral Oral Temporal  SpO2: 99% 99% 99% 100%   No intake or output data in the  24 hours ending 04/23/20 1032 There were no vitals filed for this visit.  Examination:  Constitutional: No distress Eyes: No icterus ENMT: mmm Neck: normal, supple Respiratory: Clear bilaterally, no wheezing or crackles Cardiovascular: Regular rate and rhythm, no murmurs, no edema Abdomen: Soft, nontender, nondistended, positive bowel sounds Musculoskeletal: no clubbing / cyanosis.  Skin: No rashes seen Neurologic: No focal deficits  Data Reviewed: I have independently reviewed following labs and imaging studies   CBC: Recent Labs  Lab 04/19/20 1322 04/19/20 2025 04/20/20 0741 04/21/20 0100 04/22/20 0323 04/23/20 0829  WBC 8.1 7.9  --  7.1 7.6 4.4  NEUTROABS 5.7  --   --   --   --  2.8  HGB 13.3 12.0  --  11.5* 11.6* 12.0  HCT 39.8 37.0  --  35.1* 35.3* 36.5  MCV 94.3 95.4  --  95.1 94.9 94.6  PLT 93* 77* 68* 77* 66* 59*   Basic Metabolic Panel: Recent Labs  Lab 04/19/20 1322 04/19/20 2025 04/21/20 0100 04/22/20 0323 04/23/20 0829  NA 139  --  139 137 138  K 3.7  --  3.3* 3.7 3.7  CL 106  --  108 107 106  CO2 25  --  25 21* 26  GLUCOSE 104*  --  90 90 108*  BUN 23  --  11 12 11   CREATININE 0.88 0.75 0.87 0.91 0.87  CALCIUM 8.9  --  8.6* 8.7* 9.0  MG  --   --  2.0  --   --   PHOS  --   --  3.0  --   --    Liver Function Tests: Recent Labs  Lab 04/19/20 1322 04/21/20 0100 04/23/20 0829  AST 22 18 25   ALT 15 14 19   ALKPHOS 156* 138* 136*  BILITOT 0.7 0.8 0.8  PROT 7.0 5.9* 6.1*  ALBUMIN 3.7 3.1* 3.2*   Coagulation Profile: Recent Labs  Lab 04/19/20 1322 04/20/20 0741 04/21/20 0100  INR 1.1 1.1 1.1   HbA1C: No results for input(s): HGBA1C in the last 72 hours. CBG: No results for input(s): GLUCAP in the last 168 hours.  Recent Results (from the past 240 hour(s))  Resp Panel by RT-PCR (Flu A&B, Covid) Nasopharyngeal Swab     Status: None   Collection Time: 04/19/20  1:22 PM   Specimen: Nasopharyngeal Swab; Nasopharyngeal(NP) swabs in vial  transport medium  Result Value Ref Range Status   SARS Coronavirus 2 by RT PCR NEGATIVE NEGATIVE Final    Comment: (NOTE) SARS-CoV-2 target nucleic acids are NOT DETECTED.  The SARS-CoV-2 RNA is generally detectable in upper respiratory specimens during the acute phase of infection. The lowest concentration of SARS-CoV-2 viral copies this assay can detect is 138 copies/mL. A negative result does not preclude SARS-Cov-2 infection and should not  be used as the sole basis for treatment or other patient management decisions. A negative result may occur with  improper specimen collection/handling, submission of specimen other than nasopharyngeal swab, presence of viral mutation(s) within the areas targeted by this assay, and inadequate number of viral copies(<138 copies/mL). A negative result must be combined with clinical observations, patient history, and epidemiological information. The expected result is Negative.  Fact Sheet for Patients:  EntrepreneurPulse.com.au  Fact Sheet for Healthcare Providers:  IncredibleEmployment.be  This test is no t yet approved or cleared by the [place] FDA and  has been authorized for detection and/or diagnosis of SARS-CoV-2 by FDA under an Emergency Use Authorization (EUA). This EUA will remain  in effect (meaning this test can be used) for the duration of the COVID-19 declaration under Section 564(b)(1) of the Act, 21 U.S.C.section 360bbb-3(b)(1), unless the authorization is terminated  or revoked sooner.       Influenza A by PCR NEGATIVE NEGATIVE Final   Influenza B by PCR NEGATIVE NEGATIVE Final    Comment: (NOTE) The Xpert Xpress SARS-CoV-2/FLU/RSV plus assay is intended as an aid in the diagnosis of influenza from Nasopharyngeal swab specimens and should not be used as a sole basis for treatment. Nasal washings and aspirates are unacceptable for Xpert Xpress SARS-CoV-2/FLU/RSV testing.  Fact  Sheet for Patients: EntrepreneurPulse.com.au  Fact Sheet for Healthcare Providers: IncredibleEmployment.be  This test is not yet approved or cleared by the [place] FDA and has been authorized for detection and/or diagnosis of SARS-CoV-2 by FDA under an Emergency Use Authorization (EUA). This EUA will remain in effect (meaning this test can be used) for the duration of the COVID-19 declaration under Section 564(b)(1) of the Act, 21 U.S.C. section 360bbb-3(b)(1), unless the authorization is terminated or revoked.  Performed at [facility], [address], [place], [place] [address]      Radiology Studies: No results found. Marzetta Board, MD, PhD [facility]  Between 7 am - 7 pm I am available, please contact me via Amion or Securechat  Between 7 pm - 7 am I am not available, please contact night coverage MD/APP via Amion

## 2020-04-23 NOTE — Discharge Instructions (Signed)

## 2020-04-23 NOTE — Progress Notes (Signed)
Pt left floor with OR nurse at 1015.

## 2020-04-23 NOTE — TOC Initial Note (Signed)
Transition of Care [facility]) - Initial/Assessment Note    Patient Details  Name: Natalie [name] MRN: [id number] Date of Birth: 02-25-1950  [facility] CM/SW Contact:    Pollie Friar, RN Phone Number: 04/23/2020, 2:04 PM  Clinical Narrative:                 Recommendations are for outpatient therapy. CM met with the patient and her daughter and they prefer outpatient in [place]. CM placed orders in Epic and information on the AVS. Cane for home ordered through [place] and will be delivered to the room. Pt will have intermittent supervision at home.  Pt denies any issues with home medications or transportation.  Pt has transport home when medically ready.  Expected Discharge Plan: OP Rehab Barriers to Discharge: Continued Medical Work up   Patient Goals and CMS Choice   CMS Medicare.gov Compare Post Acute Care list provided to:: Patient Choice offered to / list presented to : [place]  Expected Discharge Plan and Services Expected Discharge Plan: OP Rehab   Discharge Planning Services: CM Consult   Living arrangements for the past 2 months: Single Family Home                 DME Arranged: Kasandra Knudsen DME Agency: [employer] Date DME Agency Contacted: 04/23/20   Representative spoke with at DME Agency: Freda Munro            Prior Living Arrangements/Services Living arrangements for the past 2 months: Single Family Home Lives with:: Self Patient language and need for interpreter reviewed:: Yes Do you feel safe going back to the place where you live?: Yes      Need for Family Participation in Patient Care: Yes (Comment) (intermittent supervision) Care giver support system in place?: Yes (comment)   Criminal Activity/Legal Involvement Pertinent to Current Situation/Hospitalization: No - Comment as needed  Activities of Daily Living      Permission Sought/Granted                  Emotional Assessment Appearance:: Appears stated  age Attitude/Demeanor/Rapport: Engaged Affect (typically observed): Accepting Orientation: : Oriented to Self,Oriented to Place,Oriented to  Time,Oriented to Situation   Psych Involvement: No (comment)  Admission diagnosis:  CVA (cerebral vascular accident) ([place]) [I63.9] Stroke (cerebrum) [facility]) [I63.9] Patient Active Problem List   Diagnosis Date Noted  . Stroke (cerebrum) ([place]) 04/20/2020  . Acute CVA (cerebrovascular accident) ([place]) 04/19/2020  . CVA (cerebral vascular accident) ([place]) 04/19/2020  . History of ischemic multifocal posterior circulation stroke 02/29/2020  . Thrombocytopenia ([place]) 02/28/2020  . HPV test positive 07/08/2015  . Difficulty hearing 06/28/2015  . Arthritis, degenerative 06/28/2015  . Obesity, Class I, BMI 30-34.9 06/28/2015  . Paresthesia 06/28/2015  . Purpura, nonthrombopenic ([place]) 06/28/2015  . Skin lesion of right leg 06/28/2015  . Other specified hypothyroidism 08/09/2014  . Migraine without aura and without status migrainosus, not intractable 09/19/2008  . Moderate major depression ([place]) 09/07/2006  . OP (osteoporosis) 09/07/2006   PCP:  [name], MD Pharmacy:   [facility] [phone] [address], [place] [address] Phone: [phone] Fax: [place], [place] [address] [place] [address] Phone: [phone] Fax: [phone]     Social Determinants of Health (SDOH) Interventions    Readmission Risk Interventions No flowsheet data found.

## 2020-04-24 ENCOUNTER — Encounter (HOSPITAL_COMMUNITY): Payer: Self-pay | Admitting: Internal Medicine

## 2020-04-24 DIAGNOSIS — N838 Other noninflammatory disorders of ovary, fallopian tube and broad ligament: Secondary | ICD-10-CM

## 2020-04-24 DIAGNOSIS — N83202 Unspecified ovarian cyst, left side: Secondary | ICD-10-CM

## 2020-04-24 LAB — CBC WITH DIFFERENTIAL/PLATELET
Abs Immature Granulocytes: 0.02 10*3/uL (ref 0.00–0.07)
Basophils Absolute: 0 10*3/uL (ref 0.0–0.1)
Basophils Relative: 0 %
Eosinophils Absolute: 0.2 10*3/uL (ref 0.0–0.5)
Eosinophils Relative: 3 %
HCT: 35.4 % — ABNORMAL LOW (ref 36.0–46.0)
Hemoglobin: 12.1 g/dL (ref 12.0–15.0)
Immature Granulocytes: 0 %
Lymphocytes Relative: 27 %
Lymphs Abs: 1.6 10*3/uL (ref 0.7–4.0)
MCH: 32.1 pg (ref 26.0–34.0)
MCHC: 34.2 g/dL (ref 30.0–36.0)
MCV: 93.9 fL (ref 80.0–100.0)
Monocytes Absolute: 0.4 10*3/uL (ref 0.1–1.0)
Monocytes Relative: 6 %
Neutro Abs: 3.6 10*3/uL (ref 1.7–7.7)
Neutrophils Relative %: 64 %
Platelets: 59 10*3/uL — ABNORMAL LOW (ref 150–400)
RBC: 3.77 MIL/uL — ABNORMAL LOW (ref 3.87–5.11)
RDW: 12.1 % (ref 11.5–15.5)
WBC: 5.8 10*3/uL (ref 4.0–10.5)
nRBC: 0 % (ref 0.0–0.2)

## 2020-04-24 LAB — COMPREHENSIVE METABOLIC PANEL
ALT: 27 U/L (ref 0–44)
AST: 34 U/L (ref 15–41)
Albumin: 3.1 g/dL — ABNORMAL LOW (ref 3.5–5.0)
Alkaline Phosphatase: 128 U/L — ABNORMAL HIGH (ref 38–126)
Anion gap: 7 (ref 5–15)
BUN: 16 mg/dL (ref 8–23)
CO2: 25 mmol/L (ref 22–32)
Calcium: 8.9 mg/dL (ref 8.9–10.3)
Chloride: 105 mmol/L (ref 98–111)
Creatinine, Ser: 0.9 mg/dL (ref 0.44–1.00)
GFR, Estimated: >60 mL/min (ref >60)
Glucose, Bld: 101 mg/dL — ABNORMAL HIGH (ref 70–99)
Potassium: 3.4 mmol/L — ABNORMAL LOW (ref 3.5–5.1)
Sodium: 137 mmol/L (ref 135–145)
Total Bilirubin: 0.8 mg/dL (ref 0.3–1.2)
Total Protein: 5.8 g/dL — ABNORMAL LOW (ref 6.5–8.1)

## 2020-04-24 LAB — GLUCOSE, CAPILLARY: Glucose-Capillary: 102 mg/dL — ABNORMAL HIGH (ref 70–99)

## 2020-04-24 MED ORDER — CLOPIDOGREL BISULFATE 75 MG PO TABS: 75.0000 mg | ORAL_TABLET | Freq: Every day | ORAL | 1 refills | Status: DC

## 2020-04-24 MED ORDER — POTASSIUM CHLORIDE CRYS ER 20 MEQ PO TBCR
40.0000 meq | EXTENDED_RELEASE_TABLET | Freq: Once | ORAL | Status: AC
  Administered 2020-04-24: 40 meq via ORAL
  Filled 2020-04-24: qty 2

## 2020-04-24 MED ORDER — ASPIRIN EC 81 MG PO TBEC: 81.0000 mg | DELAYED_RELEASE_TABLET | Freq: Every day | ORAL | 0 refills | Status: AC

## 2020-04-24 MED ORDER — ASPIRIN EC 81 MG PO TBEC: 81.0000 mg | DELAYED_RELEASE_TABLET | Freq: Every day | ORAL | 0 refills | Status: DC

## 2020-04-24 NOTE — Progress Notes (Addendum)
STROKE TEAM PROGRESS NOTE   INTERVAL HISTORY No acute events overnight.  She reports that she is doing well but is feeling down today because she is still in the hospital. She reports feeling like she will become a burden to her daughter if she has further strokes. Provided reassurance and she reported that she felt better. She reports no other concerns at present.   Neuro exam is unchanged. As loop recorder has been placed stroke work up has been completed. Neurology will sign off. Hospitalist service is primary. Oncology is following.  Vitals:   04/23/20 1954 04/23/20 2153 04/23/20 2353 04/24/20 0355  BP: (!) 146/67 116/61 116/68 124/74  Pulse: 65 61 64 (!) 59  Resp: 16 20 15 15   Temp: 98.4 F (36.9 C) 98.6 F (37 C) 98.2 F (36.8 C)   TempSrc: Oral Oral Oral   SpO2: 98% 97% 98%    CBC:  Recent Labs  Lab 04/23/20 0829 04/24/20 0412  WBC 4.4 5.8  NEUTROABS 2.8 3.6  HGB 12.0 12.1  HCT 36.5 35.4*  MCV 94.6 93.9  PLT 59* 59*   Basic Metabolic Panel:  Recent Labs  Lab 04/21/20 0100 04/22/20 0323 04/23/20 0829 04/24/20 0412  NA 139   < > 138 137  K 3.3*   < > 3.7 3.4*  CL 108   < > 106 105  CO2 25   < > 26 25  GLUCOSE 90   < > 108* 101*  BUN 11   < > 11 16  CREATININE 0.87   < > 0.87 0.90  CALCIUM 8.6*   < > 9.0 8.9  MG 2.0  --   --   --   PHOS 3.0  --   --   --    < > = values in this interval not displayed.   Lipid Panel:  Recent Labs  Lab 04/20/20 0355  CHOL 87  TRIG 54  HDL 31*  CHOLHDL 2.8  VLDL 11  LDLCALC 45   HgbA1c:  Recent Labs  Lab 04/20/20 0355  HGBA1C 5.0   Urine Drug Screen:  Recent Labs  Lab 04/19/20 1322  LABOPIA NONE DETECTED  COCAINSCRNUR NONE DETECTED  LABBENZ NONE DETECTED  AMPHETMU NONE DETECTED  THCU NONE DETECTED  LABBARB NONE DETECTED    Alcohol Level  Recent Labs  Lab 04/19/20 1322  ETH <10    IMAGING past 24 hours CT ABDOMEN PELVIS W CONTRAST  Result Date: 04/23/2020 CLINICAL DATA:  Splenomegaly EXAM: CT  ABDOMEN AND PELVIS WITH CONTRAST TECHNIQUE: Multidetector CT imaging of the abdomen and pelvis was performed using the standard protocol following bolus administration of intravenous contrast. CONTRAST:  153mL OMNIPAQUE IOHEXOL 300 MG/ML  SOLN COMPARISON:  Ultrasound 04/20/2020. FINDINGS: Lower chest: No acute abnormality Hepatobiliary: Gallstones noted within the gallbladder. These measure up to 1.7 cm. No wall thickening. No biliary ductal dilatation. No focal hepatic abnormality. Pancreas: No focal abnormality or ductal dilatation. Spleen: Spleen is normal size with a craniocaudal length of 7.9 cm. No focal abnormality. Adrenals/Urinary Tract: No adrenal abnormality. No focal renal abnormality. No stones or hydronephrosis. Urinary bladder is unremarkable. Stomach/Bowel: Stomach, large and small bowel grossly unremarkable. Normal appendix. Vascular/Lymphatic: No evidence of aneurysm or adenopathy. Reproductive: Enlarged uterus with numerous fibroids. Complex septated cystic mass in the left adnexal region measuring up to 5 cm. No right adnexal mass. Other: No free fluid or free air. Musculoskeletal: No acute bony abnormality. Degenerative changes in the lumbar spine. IMPRESSION: Cholelithiasis. No evidence  of cholecystitis crash that no evidence of acute cholecystitis. Normal spleen size and appearance. Enlarged fibroid uterus. Complex septated cystic mass in the left ovary measuring up to 5 cm. This could be further evaluated with pelvic ultrasound. Electronically Signed   By: Rolm Baptise M.D.   On: 04/23/2020 21:28   EP PPM/ICD IMPLANT  Result Date: 04/23/2020 CONCLUSIONS:  1. Successful implantation of a Medtronic Reveal LINQ implantable loop recorder for cryptogenic stroke  2. No early apparent complications. Natalie Peru, MD 04/23/2020 3:27 PM    PHYSICAL EXAM Blood pressure 124/74, pulse (!) 59, temperature 98.2 F (36.8 C), temperature source Oral, resp. rate 15, SpO2 98 %.  General: alert and  awake, elderly caucasian female, no apparent distress  Lungs: Symmetrical Chest rise, no labored breathing  Cardio: Regular Rate and Rhythm  Abdomen: Soft, non-tender  Neuro: Alert, oriented, thought content appropriate.  Speech fluent without evidence of aphasia.  Able to follow all commands without difficulty. Cranial Nerves: II:  Visual fields grossly normal, pupils equal, round, reactive to light and accommodation III,IV, VI: ptosis not present, extra-ocular motions intact bilaterally V,VII: smile symmetric, facial light touch sensation normal bilaterally VIII: hearing normal bilaterally IX,X: uvula rises symmetrically XI: bilateral shoulder shrug XII: midline tongue extension without atrophy or fasciculations  Motor: Right : Upper extremity   5/5    Left:     Upper extremity   5/5  Lower extremity   5/5     Lower extremity   5/5 Tone and bulk:normal tone throughout; no atrophy noted Sensory: light touch intact throughout, bilaterally Cerebellar: normal finger-to-nose Gait: deferred    ASSESSMENT/PLAN [name] is a 70 y.o. female with history of anxiety and depression and hypothyroidism who had a stroke 3 months ago in January 2022 when she presented with diplopiapresenting with difficulty speaking.She describes the event aswalking into kitchenwith acupin her hand and suddenlydropped it.Shestared atcupwhilethinking "...did not know what to do"which is out of character for her. On arrival to ED she had difficulty speaking however symptoms have since completely resolved. She reports her symptoms have gradually improved although she still has some difficulty speaking. She denies extremity weakness.  Stroke work up has been completed and loop recorder has been placed. As her platelets are still low will not start Brilinta. Will keep her on Aspirin and Plavix for secondary prevention. Will sign off.  Stroke- acute left MCA infarcts with a nearly  occlusive thrombus in the proximal M2 and M3 branchesmost likelyembolus fromunclear source  CT negativefor acute finding  CT head and neck showed left M2/M3 occlusion.  MRI showed left MCA patchy infarct with embolic shower pattern.   DVT negative.  2D echo EF 60 to 65% 02/2020  TEE - No source of embolus, though moderate LAE is suggestive of potential possibility of PAF.  Loop recorder placed  LDL45  HgbA1c5.0  VTE prophylaxis -Lovenox  aspirin 81 mg dailyprior to admission, now on aspirin 81 mg daily andPlavix 75 DAPT. Dr. Leonie Man recommended aspirin and the Brilinta for 30 days and then Plavix alone. As platelets remained low, will continue with Aspirin and Plavix for 3 weeks and then plavix alone.  Therapy recommendations:Outpatient PT/OT  Disposition:Pending  Hx of stroke  1/2022admittedwith diplopia and dizziness. MRI showed a bilateral subacute scattered cortical subcortical infarct in the right occipital infarct. EF 60 to 65%. CT head and neck negative. Put on DAPT and Lipitor 40.   Had 30-daycardiac eventmonitoring as outpatient, report pending.  Thrombocytopenia  Platelet68->  77->66->59->59  USliver unremarkable  Continue monitoring  On aspirin and Plavix now.   Hematology consulted and appreciate help  DIC panel 3/18 did not show any schistocytes  CT A/P Normal spleen size and appearance.  Hypertension  Home meds:None  Stable  Long-term BP goal normotensive  Hyperlipidemia  Home meds:Lipitor,resumed in hospital  LDL 45, goal < 70  OnLipitor 40 mg daily  Continue statin at discharge  Other Stroke Risk Factors  Advanced Age >/= 54  FormerCigarette smoker  Obesity, BMI33.89,associated with increased stroke risk, recommend weight loss, diet and exercise as appropriate   Migraines  Other Active Problems  Hypothyroidism - Synthroid 75 mcg daily   Hospital day # [place]  MD Resident  ATTENDING NOTE: I reviewed above note and agree with the assessment and plan. Loop recorder placed yesterday. Pt no acute event overnight. Neuro stable. Platelet still 59, CT A/P unremarkable. Will recommend to continue ASA and plavix for 3 weeks and then plavix alone. Continue statin. Follow up with hematology as outpt  Neurology will sign off. Please call with questions. Pt will follow up with stroke clinic NP at [facility] in about 4 weeks. Thanks for the consult.  [name], MD PhD Stroke Neurology 04/24/2020 3:14 PM    To contact Stroke Continuity provider, please refer to [email or web address] After hours, contact General Neurology

## 2020-04-24 NOTE — TOC Transition Note (Signed)
Transition of Care [facility]) - CM/SW Discharge Note   Patient Details  Name: Natalie [name] MRN: [id number] Date of Birth: [birth year]  [facility] CM/SW Contact:  Pollie Friar, RN Phone Number: 04/24/2020, 12:37 PM   Clinical Narrative:    Pt discharging home with outpatient therapy. Outpatient set up through [facility] outpatient therapy with information on AVS. Cane for home in the patients room.  Pt has supervision at home and transportation to home.   Final next level of care: OP Rehab Barriers to Discharge: No Barriers Identified   Patient Goals and CMS Choice   CMS Medicare.gov Compare Post Acute Care list provided to:: Patient Choice offered to / list presented to : [place]  Discharge Placement                       Discharge Plan and Services   Discharge Planning Services: CM Consult            DME Arranged: Kasandra Knudsen DME Agency: [employer] Date DME Agency Contacted: 04/23/20   Representative spoke with at DME Agency: [place] Determinants of Health ([place]) Interventions     Readmission Risk Interventions No flowsheet data found.

## 2020-04-24 NOTE — Progress Notes (Addendum)
HEMATOLOGY-ONCOLOGY PROGRESS NOTE  I have seen the patient, examined her and review plan of care with the patient and her daughter  Acute on chronic thrombocytopenia She was noted to have mild thrombocytopenia on admission but has had a continued slow decline in her platelet count this admission  She is not actively bleeding CT abdomen/pelvis did not show any evidence for splenomegaly DIC panel performed on 3/18 did not show any evidence of schistocytes Overall, the acute drop is reflective of consumption due to formation of large amount of clot recently. Her peripheral blood smear also reviewed some slight platelet clumping No need for transfusion at this time, observe There is no contraindication to remain on antiplatelet agents or anticoagulants as long as the platelet is greater than 50,000 The patient may be discharged home from our standpoint we will arrange for follow-up to recheck her counts in our office next week.  Left ovarian mass CT abdomen/pelvis shows incidental finding of a complex septated cystic mass in the left ovary measuring 5 cm Results of been discussed with the patient Case has been reviewed with GYN oncology who recommends outpatient follow-up   Acute left MCA infarcts with nearly occlusive thrombus in the proximal M2 and M3 branches Neurology has been consulted and is following the patient Okay to continue aspirin and Plavix  Hyperlipidemia Continue statin per primary   Hypothyroidism Continue Synthroid per primary  Discharge planning She can be discharged from my perspective I have arranged outpatient follow-up next week I will sign  [name], MD  SUBJECTIVE: The patient is feeling better More alert and noted to have less slurring of her speech No bleeding reported She hopes to go home today  REVIEW OF SYSTEMS:   Constitutional: Denies fevers, chills  Eyes: Denies blurriness of vision Ears, nose, mouth, throat, and face: Denies mucositis or  sore throat Respiratory: Denies cough, dyspnea or wheezes Cardiovascular: Denies palpitation, chest discomfort Gastrointestinal:  Denies nausea, heartburn or change in bowel habits Skin: Denies abnormal skin rashes Lymphatics: Denies new lymphadenopathy or easy bruising Neurological:Denies numbness, tingling or new weaknesses Behavioral/Psych: Mood is stable, no new changes  Extremities: No lower extremity edema All other systems were reviewed with the patient and are negative.  I have reviewed the past medical history, past surgical history, social history and family history with the patient and they are unchanged from previous note.   PHYSICAL EXAMINATION:  Vitals:   04/24/20 0753 04/24/20 1200  BP: (!) 143/90 (!) 152/87  Pulse: (!) 58 68  Resp: 12 17  Temp: 98.2 F (36.8 C) 97.7 F (36.5 C)  SpO2: 98% 97%   There were no vitals filed for this visit.  Intake/Output from previous day: 03/21 0701 - 03/22 0700 In: 240 [P.O.:240] Out: -   GENERAL:alert, no distress and comfortable NEURO: alert & oriented x 3 with fluent speech, no focal motor/sensory deficits  LABORATORY DATA:  I have reviewed the data as listed CMP Latest Ref Rng & Units 04/24/2020 04/23/2020 04/22/2020  Glucose 70 - 99 mg/dL 101(H) 108(H) 90  BUN 8 - 23 mg/dL 16 11 12   Creatinine 0.44 - 1.00 mg/dL 0.90 0.87 0.91  Sodium 135 - 145 mmol/L 137 138 137  Potassium 3.5 - 5.1 mmol/L 3.4(L) 3.7 3.7  Chloride 98 - 111 mmol/L 105 106 107  CO2 22 - 32 mmol/L 25 26 21(L)  Calcium 8.9 - 10.3 mg/dL 8.9 9.0 8.7(L)  Total Protein 6.5 - 8.1 g/dL 5.8(L) 6.1(L) -  Total Bilirubin 0.3 -  1.2 mg/dL 0.8 0.8 -  Alkaline Phos 38 - 126 U/L 128(H) 136(H) -  AST 15 - 41 U/L 34 25 -  ALT 0 - 44 U/L 27 19 -    Lab Results  Component Value Date   WBC 5.8 04/24/2020   HGB 12.1 04/24/2020   HCT 35.4 (L) 04/24/2020   MCV 93.9 04/24/2020   PLT 59 (L) 04/24/2020   NEUTROABS 3.6 04/24/2020   I have reviewed CT imaging with the  patient and her daughter  Gould  Result Date: 04/19/2020 CLINICAL DATA:  Code stroke follow-up EXAM: CT ANGIOGRAPHY HEAD AND NECK TECHNIQUE: Multidetector CT imaging of the head and neck was performed using the standard protocol during bolus administration of intravenous contrast. Multiplanar CT image reconstructions and MIPs were obtained to evaluate the vascular anatomy. Carotid stenosis measurements (when applicable) are obtained utilizing NASCET criteria, using the distal internal carotid diameter as the denominator. CONTRAST:  37mL OMNIPAQUE IOHEXOL 350 MG/ML SOLN COMPARISON:  02/29/2020 FINDINGS: CTA NECK Aortic arch: Great vessel origins are patent. Right carotid system: Patent.  No stenosis at the ICA origin. Left carotid system: Patent.  No stenosis at the ICA origin. Vertebral arteries: Patent. Right vertebral artery is dominant. No stenosis. Skeleton: Stable degenerative changes of the cervical spine. Other neck: No new finding. Upper chest: No new finding. Review of the MIP images confirms the above findings CTA HEAD Anterior circulation: Intracranial internal carotid arteries are patent. Left M1 MCA is patent. There is nearly occlusive thrombus within a proximal M2 branch approximately 6 mm from the bifurcation. Subsequent occlusion of a proximal left M3 branch. Right middle and both anterior cerebral arteries are patent. Posterior circulation: Intracranial internal carotid arteries are patent. Basilar artery is patent. Major cerebellar artery origins are patent. Posterior cerebral arteries are patent. Left posterior communicating artery is present. Venous sinuses: Patent as allowed by contrast bolus timing. Review of the MIP images confirms the above findings IMPRESSION: Nearly occlusive thrombus at the bifurcation of a proximal M2 MCA branch approximately 6 mm from the MCA bifurcation. Subsequent occlusion of a proximal left M3 MCA branch. No stenosis in the neck.  These results were called by telephone at the time of interpretation on 04/19/2020 at 2:01 pm to provider [facility] , who verbally acknowledged these results. Electronically Signed   By: Macy Mis M.D.   On: 04/19/2020 14:10   MR BRAIN WO CONTRAST  Result Date: 04/19/2020 CLINICAL DATA:  Initial evaluation for acute stroke. EXAM: MRI HEAD WITHOUT CONTRAST TECHNIQUE: Multiplanar, multiecho pulse sequences of the brain and surrounding structures were obtained without intravenous contrast. COMPARISON:  Prior CTs from earlier the same day as well as previous MRI from 02/28/2020. FINDINGS: Brain: Generalized age appropriate cerebral volume. Mild chronic microvascular ischemic disease again noted. Patchy small volume foci of restricted diffusion seen involving the cortical and subcortical aspect of the posterior left frontoparietal and temporal region, consistent with posterior left MCA distribution infarcts. For reference purposes, the largest of these foci seen at the posterior left temporal region and measures 8 mm. Findings are embolic in distribution. No associated hemorrhage or mass effect. Additional apparent subcentimeter focus of mild diffusion abnormality at the posterior right frontoparietal centrum semi ovale favored to reflect T2 shine through (series 2, image 34). No other evidence for acute or subacute ischemia. Gray-white matter differentiation otherwise maintained. No acute intracranial hemorrhage. Small focus of susceptibility artifact at the posterior right temporal region noted, consistent with  a small chronic microhemorrhage. Additional minimal residual chronic blood products noted about the occipital regions related to recently identified ischemic infarcts. No mass lesion, midline shift or mass effect. No hydrocephalus or extra-axial fluid collection. Pituitary gland suprasellar region within normal limits. Midline structures intact. Vascular: Hypoplastic left vertebral artery not well  seen. Major intracranial vascular flow voids are otherwise maintained. Skull and upper cervical spine: Craniocervical junction within normal limits. Bone marrow signal intensity normal. No focal marrow replacing lesion. No scalp soft tissue abnormality. Sinuses/Orbits: Patient status post bilateral ocular lens replacement. Globes and orbital soft tissues demonstrate no acute finding. Paranasal sinuses are largely clear. No significant mastoid effusion. Inner ear structures grossly normal. Other: None. IMPRESSION: 1. Patchy small volume acute ischemic nonhemorrhagic posterior left MCA distribution infarcts, likely reflecting an embolic shower. No associated hemorrhage or mass effect. 2. No other acute intracranial abnormality. 3. Underlying mild chronic microvascular ischemic disease. Electronically Signed   By: Jeannine Boga M.D.   On: 04/19/2020 23:39   CT ABDOMEN PELVIS W CONTRAST  Result Date: 04/23/2020 CLINICAL DATA:  Splenomegaly EXAM: CT ABDOMEN AND PELVIS WITH CONTRAST TECHNIQUE: Multidetector CT imaging of the abdomen and pelvis was performed using the standard protocol following bolus administration of intravenous contrast. CONTRAST:  126mL OMNIPAQUE IOHEXOL 300 MG/ML  SOLN COMPARISON:  Ultrasound 04/20/2020. FINDINGS: Lower chest: No acute abnormality Hepatobiliary: Gallstones noted within the gallbladder. These measure up to 1.7 cm. No wall thickening. No biliary ductal dilatation. No focal hepatic abnormality. Pancreas: No focal abnormality or ductal dilatation. Spleen: Spleen is normal size with a craniocaudal length of 7.9 cm. No focal abnormality. Adrenals/Urinary Tract: No adrenal abnormality. No focal renal abnormality. No stones or hydronephrosis. Urinary bladder is unremarkable. Stomach/Bowel: Stomach, large and small bowel grossly unremarkable. Normal appendix. Vascular/Lymphatic: No evidence of aneurysm or adenopathy. Reproductive: Enlarged uterus with numerous fibroids. Complex  septated cystic mass in the left adnexal region measuring up to 5 cm. No right adnexal mass. Other: No free fluid or free air. Musculoskeletal: No acute bony abnormality. Degenerative changes in the lumbar spine. IMPRESSION: Cholelithiasis. No evidence of cholecystitis crash that no evidence of acute cholecystitis. Normal spleen size and appearance. Enlarged fibroid uterus. Complex septated cystic mass in the left ovary measuring up to 5 cm. This could be further evaluated with pelvic ultrasound. Electronically Signed   By: Rolm Baptise M.D.   On: 04/23/2020 21:28   EP PPM/ICD IMPLANT  Result Date: 04/23/2020 CONCLUSIONS:  1. Successful implantation of a [employer] Reveal LINQ implantable loop recorder for cryptogenic stroke  2. No early apparent complications. Cristopher Peru, MD 04/23/2020 3:27 PM   CT HEAD CODE STROKE WO CONTRAST  Addendum Date: 04/19/2020   ADDENDUM REPORT: 04/19/2020 13:56 ADDENDUM: Under ASPECT score calculation there is a dictation error. Should state 7 rather than 6. Electronically Signed   By: Macy Mis M.D.   On: 04/19/2020 13:56   Result Date: 04/19/2020 CLINICAL DATA:  Code stroke. EXAM: CT HEAD WITHOUT CONTRAST TECHNIQUE: Contiguous axial images were obtained from the base of the skull through the vertex without intravenous contrast. COMPARISON:  None. FINDINGS: Brain: No acute intracranial hemorrhage, mass effect, or edema. No definite new loss of gray-white differentiation. Ventricles and sulci are normal in size and configuration. No extra-axial collection. Vascular: Focal density along a left M2 MCA branch within the sylvian fissure. Skull: Unremarkable. Sinuses/Orbits: No acute finding. Other: Mastoid air cells are clear. ASPECTS [facility] Stroke Program Early CT Score) - Ganglionic level infarction (caudate, lentiform nuclei,  internal capsule, insula, M1-M3 cortex): 6 - Supraganglionic infarction (M4-M6 cortex): 3 Total score (0-10 with 10 being normal): 9 IMPRESSION: No  acute intracranial hemorrhage.  No definite acute infarction. Possible dense left M2 MCA branch within the sylvian fissure. CTA pending. These results were called by telephone at the time of interpretation on 04/19/2020 at 1:42 pm to provider Lavonia Drafts , who verbally acknowledged these results. Electronically Signed: By: Macy Mis M.D. On: 04/19/2020 13:46   VAS [place] LOWER EXTREMITY VENOUS (DVT)  Result Date: 04/22/2020  Lower Venous DVT Study Indications: Stroke.  Comparison Study: No prior study Performing Technologist: Sharion Dove RVS  Examination Guidelines: A complete evaluation includes B-mode imaging, spectral Doppler, color Doppler, and power Doppler as needed of all accessible portions of each vessel. Bilateral testing is considered an integral part of a complete examination. Limited examinations for reoccurring indications may be performed as noted. The reflux portion of the exam is performed with the patient in reverse Trendelenburg.  +---------+---------------+---------+-----------+----------+--------------+ RIGHT    CompressibilityPhasicitySpontaneityPropertiesThrombus Aging +---------+---------------+---------+-----------+----------+--------------+ CFV      Full           Yes      Yes                                 +---------+---------------+---------+-----------+----------+--------------+ SFJ      Full                                                        +---------+---------------+---------+-----------+----------+--------------+ FV Prox  Full                                                        +---------+---------------+---------+-----------+----------+--------------+ FV Mid   Full                                                        +---------+---------------+---------+-----------+----------+--------------+ FV DistalFull                                                         +---------+---------------+---------+-----------+----------+--------------+ PFV      Full                                                        +---------+---------------+---------+-----------+----------+--------------+ POP      Full           Yes      Yes                                 +---------+---------------+---------+-----------+----------+--------------+ PTV  Full                                                        +---------+---------------+---------+-----------+----------+--------------+ PERO     Full                                                        +---------+---------------+---------+-----------+----------+--------------+   +---------+---------------+---------+-----------+----------+--------------+ LEFT     CompressibilityPhasicitySpontaneityPropertiesThrombus Aging +---------+---------------+---------+-----------+----------+--------------+ CFV      Full           Yes      Yes                                 +---------+---------------+---------+-----------+----------+--------------+ SFJ      Full                                                        +---------+---------------+---------+-----------+----------+--------------+ FV Prox  Full                                                        +---------+---------------+---------+-----------+----------+--------------+ FV Mid   Full                                                        +---------+---------------+---------+-----------+----------+--------------+ FV DistalFull                                                        +---------+---------------+---------+-----------+----------+--------------+ PFV      Full                                                        +---------+---------------+---------+-----------+----------+--------------+ POP      Full           Yes      Yes                                  +---------+---------------+---------+-----------+----------+--------------+ PTV      Full                                                        +---------+---------------+---------+-----------+----------+--------------+  PERO     Full                                                        +---------+---------------+---------+-----------+----------+--------------+     Summary: BILATERAL: - No evidence of deep vein thrombosis seen in the lower extremities, bilaterally. -No evidence of popliteal cyst, bilaterally.   *See table(s) above for measurements and observations. Electronically signed by Ruta Hinds MD on 04/22/2020 at 11:21:13 AM.    Final    US Abdomen Limited RUQ (LIVER/GB)  Result Date: 04/20/2020 CLINICAL DATA:  Thrombocytopenia.  Inpatient. EXAM: ULTRASOUND ABDOMEN LIMITED RIGHT UPPER QUADRANT COMPARISON:  None. FINDINGS: Gallbladder: Numerous layering shadowing gallstones in the gallbladder, largest 1.4 cm. No gallbladder wall thickening. No pericholecystic fluid. No sonographic Murphy's sign. Common bile duct: Diameter: 6 mm Liver: Top-normal liver parenchymal echogenicity. No liver masses. Portal vein is patent on color Doppler imaging with normal direction of blood flow towards the liver. Other: None. IMPRESSION: 1. Cholelithiasis.  No evidence of acute cholecystitis. 2. Top normal caliber common bile duct (6 mm diameter). Suggest correlation with serum bilirubin levels. MRI abdomen with MRCP may be considered as clinically warranted. 3. Normal liver. Electronically Signed   By: Ilona Sorrel M.D.   On: 04/20/2020 13:21

## 2020-04-24 NOTE — Plan of Care (Signed)
Min assist with adls

## 2020-04-24 NOTE — Discharge Summary (Signed)
Physician Discharge Summary  Natalie [name] QHU:[id number] DOB: 1950-06-24 DOA: 04/19/2020  PCP: [name], MD  Admit date: 04/19/2020 Discharge date: 04/24/2020  Admitted From: home Disposition:  home  Recommendations for Outpatient Follow-up:  1. Follow up with PCP in 1-2 weeks 2. Follow-up with hematology as an outpatient next week 3. Follow-up with neurology as an outpatient 4. Patient was placed on dual antiplatelet therapy with aspirin and Plavix for 21 days then Plavix alone  Home Health: none Equipment/Devices: none  Discharge Condition: stable CODE STATUS: Full code Diet recommendation: regular  HPI: Per admitting MD, Natalie [name] is an 70 y.o. female with PMH significant for anxiety and depression and hypothyroidism who had a stroke 3 months ago in January 2022 when she presented with diplopia.  At that time the diplopia resolved and patient was placed on aspirin and atorvastatin.  Last week she had another episode of diplopia which lasted 1 to 2 minutes and resolve spontaneously.  Today at about 930 or 10 patient developed difficulty speaking and thinking.  Notes that she was having difficulty enunciating her words and was not quite sure what she was trying to do.  She felt somewhat confused. To my history, patient states that her speech difficulty has been waxing and waning over the course of the afternoon.  She notes she feels better after she takes a nap and feels her speech is better however when she gets up her speech is worse again.  Her daughter notes that her speech is not back to baseline.  Patient also notes she is having some difficulty thinking and expressing herself although denies difficulty with word finding, it is more that she has difficulty knowing what she wants to say.  She feels her thinking is slowed. Patient denies fevers or chills.  No new intercurrent illness.  No dysuria.  No nausea vomiting abdominal pain or malaise.  No cough or shortness  of breath.  Hospital Course / Discharge diagnoses: Principal Problem Acute left MCA infarcts with a nearly occlusive thrombus in the proximal M2 and M3 branches -Neurology consulted and following.  Discussed with Dr. Erlinda Hong.  Continue aspirin and Plavix.  Lipid panel showed an LDL of 108.  She is on statin.  A1c 5.0.  Lower extremity Dopplers negative for DVT.    Patient underwent a TEE with moderate left atrial enlargement and no LA appendage thrombus.  She also underwent a loop recorder.  She will be placed on dual antiplatelet therapy with aspirin and Plavix for 21 days then Plavix alone.  Active Problems Thrombocytopenia -Patient has no history of thrombocytopenia.  In January 2022 with her prior stroke her platelets were mildly decreased at 108.  Her platelets have progressively decreased here in the hospital but eventually remained stable.  Hematology consulted and evaluated patient, discussed with Dr. Alvy Bimler, this may represent a consumptive process in the setting of thrombosis. Hyperlipidemia -Continue statin Hypothyroidism -Continue Synthroid Left ovarian complex mass-5 cm, oncology plans close outpatient follow-up and referral to GYN onc  Sepsis ruled out   Discharge Instructions  Discharge Instructions    Ambulatory referral to Physical Therapy   Complete by: As directed      Allergies as of 04/24/2020      Reactions   Sulfamethoxazole-trimethoprim Hives      Medication List    STOP taking these medications   SUMAtriptan 100 MG tablet Commonly known as: IMITREX     TAKE these medications   Artificial Tears 1.4 % ophthalmic  solution Generic drug: polyvinyl alcohol Place 1 drop into both eyes daily as needed for dry eyes.   aspirin EC 81 MG tablet Take 1 tablet (81 mg total) by mouth daily for 21 days. What changed: additional instructions   atorvastatin 40 MG tablet Commonly known as: LIPITOR Take 1 tablet (40 mg total) by mouth daily.   buPROPion 150 MG 12 hr  tablet Commonly known as: WELLBUTRIN SR Take 150 mg by mouth 2 (two) times daily.   citalopram 40 MG tablet Commonly known as: CELEXA Take 40 mg by mouth daily.   clindamycin-benzoyl peroxide gel Commonly known as: BenzaClin Apply topically 2 (two) times daily. What changed:   how much to take  when to take this  reasons to take this   clopidogrel 75 MG tablet Commonly known as: Plavix Take 1 tablet (75 mg total) by mouth daily.   Fish Oil 1000 MG Caps Take 1 capsule by mouth daily.   levothyroxine 75 MCG tablet Commonly known as: SYNTHROID Take 1 tablet (75 mcg total) by mouth daily before breakfast.   multivitamin with minerals Tabs tablet Take 1 tablet by mouth daily.   topiramate 25 MG tablet Commonly known as: TOPAMAX Take 25 mg by mouth daily.   Vitamin B-12 1000 MCG Subl Place 1,000 mcg under the tongue at bedtime.   Vitamin D (Ergocalciferol) 1.25 MG (50000 UNIT) Caps capsule Commonly known as: DRISDOL TAKE 1 CAPSULE BY MOUTH EVERY 7 DAYS What changed:   how much to take  how to take this  when to take this  additional instructions            Durable Medical Equipment  (From admission, onward)         Start     Ordered   04/23/20 1345  For home use only DME Cane  Once        04/23/20 1344          Follow-up Information    Shirley Friar, PA-C Follow up.   Specialty: Physician Assistant Why: on 4/1 at 1 pm for post loop recorder wound check Contact information: [address] [place] [address] [phone]        [place] Follow up.   Specialty: Rehabilitation Why: The outpatient therapy will contact you for the first appointment Contact information: [place] [id number] [address] [phone]              Consultations:  Neurology   Hematology   Procedures/Studies:  CT ANGIOGRAM HEAD NECK W WO  CONTRAST  Result Date: 04/19/2020 CLINICAL DATA:  Code stroke follow-up EXAM: CT ANGIOGRAPHY HEAD AND NECK TECHNIQUE: Multidetector CT imaging of the head and neck was performed using the standard protocol during bolus administration of intravenous contrast. Multiplanar CT image reconstructions and MIPs were obtained to evaluate the vascular anatomy. Carotid stenosis measurements (when applicable) are obtained utilizing NASCET criteria, using the distal internal carotid diameter as the denominator. CONTRAST:  24mL OMNIPAQUE IOHEXOL 350 MG/ML SOLN COMPARISON:  02/29/2020 FINDINGS: CTA NECK Aortic arch: Great vessel origins are patent. Right carotid system: Patent.  No stenosis at the ICA origin. Left carotid system: Patent.  No stenosis at the ICA origin. Vertebral arteries: Patent. Right vertebral artery is dominant. No stenosis. Skeleton: Stable degenerative changes of the cervical spine. Other neck: No new finding. Upper chest: No new finding. Review of the MIP images confirms the above findings CTA HEAD  Anterior circulation: Intracranial internal carotid arteries are patent. Left M1 MCA is patent. There is nearly occlusive thrombus within a proximal M2 branch approximately 6 mm from the bifurcation. Subsequent occlusion of a proximal left M3 branch. Right middle and both anterior cerebral arteries are patent. Posterior circulation: Intracranial internal carotid arteries are patent. Basilar artery is patent. Major cerebellar artery origins are patent. Posterior cerebral arteries are patent. Left posterior communicating artery is present. Venous sinuses: Patent as allowed by contrast bolus timing. Review of the MIP images confirms the above findings IMPRESSION: Nearly occlusive thrombus at the bifurcation of a proximal M2 MCA branch approximately 6 mm from the MCA bifurcation. Subsequent occlusion of a proximal left M3 MCA branch. No stenosis in the neck. These results were called by telephone at the time of  interpretation on 04/19/2020 at 2:01 pm to provider [facility] , who verbally acknowledged these results. Electronically Signed   By: Macy Mis M.D.   On: 04/19/2020 14:10   MR BRAIN WO CONTRAST  Result Date: 04/19/2020 CLINICAL DATA:  Initial evaluation for acute stroke. EXAM: MRI HEAD WITHOUT CONTRAST TECHNIQUE: Multiplanar, multiecho pulse sequences of the brain and surrounding structures were obtained without intravenous contrast. COMPARISON:  Prior CTs from earlier the same day as well as previous MRI from 02/28/2020. FINDINGS: Brain: Generalized age appropriate cerebral volume. Mild chronic microvascular ischemic disease again noted. Patchy small volume foci of restricted diffusion seen involving the cortical and subcortical aspect of the posterior left frontoparietal and temporal region, consistent with posterior left MCA distribution infarcts. For reference purposes, the largest of these foci seen at the posterior left temporal region and measures 8 mm. Findings are embolic in distribution. No associated hemorrhage or mass effect. Additional apparent subcentimeter focus of mild diffusion abnormality at the posterior right frontoparietal centrum semi ovale favored to reflect T2 shine through (series 2, image 34). No other evidence for acute or subacute ischemia. Gray-white matter differentiation otherwise maintained. No acute intracranial hemorrhage. Small focus of susceptibility artifact at the posterior right temporal region noted, consistent with a small chronic microhemorrhage. Additional minimal residual chronic blood products noted about the occipital regions related to recently identified ischemic infarcts. No mass lesion, midline shift or mass effect. No hydrocephalus or extra-axial fluid collection. Pituitary gland suprasellar region within normal limits. Midline structures intact. Vascular: Hypoplastic left vertebral artery not well seen. Major intracranial vascular flow voids are  otherwise maintained. Skull and upper cervical spine: Craniocervical junction within normal limits. Bone marrow signal intensity normal. No focal marrow replacing lesion. No scalp soft tissue abnormality. Sinuses/Orbits: Patient status post bilateral ocular lens replacement. Globes and orbital soft tissues demonstrate no acute finding. Paranasal sinuses are largely clear. No significant mastoid effusion. Inner ear structures grossly normal. Other: None. IMPRESSION: 1. Patchy small volume acute ischemic nonhemorrhagic posterior left MCA distribution infarcts, likely reflecting an embolic shower. No associated hemorrhage or mass effect. 2. No other acute intracranial abnormality. 3. Underlying mild chronic microvascular ischemic disease. Electronically Signed   By: Jeannine Boga M.D.   On: 04/19/2020 23:39   CT ABDOMEN PELVIS W CONTRAST  Result Date: 04/23/2020 CLINICAL DATA:  Splenomegaly EXAM: CT ABDOMEN AND PELVIS WITH CONTRAST TECHNIQUE: Multidetector CT imaging of the abdomen and pelvis was performed using the standard protocol following bolus administration of intravenous contrast. CONTRAST:  12mL OMNIPAQUE IOHEXOL 300 MG/ML  SOLN COMPARISON:  Ultrasound 04/20/2020. FINDINGS: Lower chest: No acute abnormality Hepatobiliary: Gallstones noted within the gallbladder. These measure up to 1.7 cm. No wall  thickening. No biliary ductal dilatation. No focal hepatic abnormality. Pancreas: No focal abnormality or ductal dilatation. Spleen: Spleen is normal size with a craniocaudal length of 7.9 cm. No focal abnormality. Adrenals/Urinary Tract: No adrenal abnormality. No focal renal abnormality. No stones or hydronephrosis. Urinary bladder is unremarkable. Stomach/Bowel: Stomach, large and small bowel grossly unremarkable. Normal appendix. Vascular/Lymphatic: No evidence of aneurysm or adenopathy. Reproductive: Enlarged uterus with numerous fibroids. Complex septated cystic mass in the left adnexal region  measuring up to 5 cm. No right adnexal mass. Other: No free fluid or free air. Musculoskeletal: No acute bony abnormality. Degenerative changes in the lumbar spine. IMPRESSION: Cholelithiasis. No evidence of cholecystitis crash that no evidence of acute cholecystitis. Normal spleen size and appearance. Enlarged fibroid uterus. Complex septated cystic mass in the left ovary measuring up to 5 cm. This could be further evaluated with pelvic ultrasound. Electronically Signed   By: Rolm Baptise M.D.   On: 04/23/2020 21:28   EP PPM/ICD IMPLANT  Result Date: 04/23/2020 CONCLUSIONS:  1. Successful implantation of a [employer] Reveal LINQ implantable loop recorder for cryptogenic stroke  2. No early apparent complications. Cristopher Peru, MD 04/23/2020 3:27 PM   CT HEAD CODE STROKE WO CONTRAST  Addendum Date: 04/19/2020   ADDENDUM REPORT: 04/19/2020 13:56 ADDENDUM: Under ASPECT score calculation there is a dictation error. Should state 7 rather than 6. Electronically Signed   By: Macy Mis M.D.   On: 04/19/2020 13:56   Result Date: 04/19/2020 CLINICAL DATA:  Code stroke. EXAM: CT HEAD WITHOUT CONTRAST TECHNIQUE: Contiguous axial images were obtained from the base of the skull through the vertex without intravenous contrast. COMPARISON:  None. FINDINGS: Brain: No acute intracranial hemorrhage, mass effect, or edema. No definite new loss of gray-white differentiation. Ventricles and sulci are normal in size and configuration. No extra-axial collection. Vascular: Focal density along a left M2 MCA branch within the sylvian fissure. Skull: Unremarkable. Sinuses/Orbits: No acute finding. Other: Mastoid air cells are clear. ASPECTS [facility] Stroke Program Early CT Score) - Ganglionic level infarction (caudate, lentiform nuclei, internal capsule, insula, M1-M3 cortex): 6 - Supraganglionic infarction (M4-M6 cortex): 3 Total score (0-10 with 10 being normal): 9 IMPRESSION: No acute intracranial hemorrhage.  No definite  acute infarction. Possible dense left M2 MCA branch within the sylvian fissure. CTA pending. These results were called by telephone at the time of interpretation on 04/19/2020 at 1:42 pm to provider Lavonia Drafts , who verbally acknowledged these results. Electronically Signed: By: Macy Mis M.D. On: 04/19/2020 13:46   VAS [place] LOWER EXTREMITY VENOUS (DVT)  Result Date: 04/22/2020  Lower Venous DVT Study Indications: Stroke.  Comparison Study: No prior study Performing Technologist: Sharion Dove RVS  Examination Guidelines: A complete evaluation includes B-mode imaging, spectral Doppler, color Doppler, and power Doppler as needed of all accessible portions of each vessel. Bilateral testing is considered an integral part of a complete examination. Limited examinations for reoccurring indications may be performed as noted. The reflux portion of the exam is performed with the patient in reverse Trendelenburg.  +---------+---------------+---------+-----------+----------+--------------+ RIGHT    CompressibilityPhasicitySpontaneityPropertiesThrombus Aging +---------+---------------+---------+-----------+----------+--------------+ CFV      Full           Yes      Yes                                 +---------+---------------+---------+-----------+----------+--------------+ SFJ      Full                                                        +---------+---------------+---------+-----------+----------+--------------+  FV Prox  Full                                                        +---------+---------------+---------+-----------+----------+--------------+ FV Mid   Full                                                        +---------+---------------+---------+-----------+----------+--------------+ FV DistalFull                                                        +---------+---------------+---------+-----------+----------+--------------+ PFV      Full                                                         +---------+---------------+---------+-----------+----------+--------------+ POP      Full           Yes      Yes                                 +---------+---------------+---------+-----------+----------+--------------+ PTV      Full                                                        +---------+---------------+---------+-----------+----------+--------------+ PERO     Full                                                        +---------+---------------+---------+-----------+----------+--------------+   +---------+---------------+---------+-----------+----------+--------------+ LEFT     CompressibilityPhasicitySpontaneityPropertiesThrombus Aging +---------+---------------+---------+-----------+----------+--------------+ CFV      Full           Yes      Yes                                 +---------+---------------+---------+-----------+----------+--------------+ SFJ      Full                                                        +---------+---------------+---------+-----------+----------+--------------+ FV Prox  Full                                                        +---------+---------------+---------+-----------+----------+--------------+  FV Mid   Full                                                        +---------+---------------+---------+-----------+----------+--------------+ FV DistalFull                                                        +---------+---------------+---------+-----------+----------+--------------+ PFV      Full                                                        +---------+---------------+---------+-----------+----------+--------------+ POP      Full           Yes      Yes                                 +---------+---------------+---------+-----------+----------+--------------+ PTV      Full                                                         +---------+---------------+---------+-----------+----------+--------------+ PERO     Full                                                        +---------+---------------+---------+-----------+----------+--------------+     Summary: BILATERAL: - No evidence of deep vein thrombosis seen in the lower extremities, bilaterally. -No evidence of popliteal cyst, bilaterally.   *See table(s) above for measurements and observations. Electronically signed by Ruta Hinds MD on 04/22/2020 at 11:21:13 AM.    Final    US Abdomen Limited RUQ (LIVER/GB)  Result Date: 04/20/2020 CLINICAL DATA:  Thrombocytopenia.  Inpatient. EXAM: ULTRASOUND ABDOMEN LIMITED RIGHT UPPER QUADRANT COMPARISON:  None. FINDINGS: Gallbladder: Numerous layering shadowing gallstones in the gallbladder, largest 1.4 cm. No gallbladder wall thickening. No pericholecystic fluid. No sonographic Murphy's sign. Common bile duct: Diameter: 6 mm Liver: Top-normal liver parenchymal echogenicity. No liver masses. Portal vein is patent on color Doppler imaging with normal direction of blood flow towards the liver. Other: None. IMPRESSION: 1. Cholelithiasis.  No evidence of acute cholecystitis. 2. Top normal caliber common bile duct (6 mm diameter). Suggest correlation with serum bilirubin levels. MRI abdomen with MRCP may be considered as clinically warranted. 3. Normal liver. Electronically Signed   By: Ilona Sorrel M.D.   On: 04/20/2020 13:21     Subjective: - no chest pain, shortness of breath, no abdominal pain, nausea or vomiting.   Discharge Exam: BP (!) 152/87 (BP Location: Left Arm)   Pulse 68   Temp 97.7 F (36.5 C) (Oral)   Resp 17   SpO2 97%  General: Pt is alert, awake, not in acute distress Cardiovascular: RRR, S1/S2 +, no rubs, no gallops Respiratory: CTA bilaterally, no wheezing, no rhonchi Abdominal: Soft, NT, ND, bowel sounds + Extremities: no edema, no cyanosis   The results of significant diagnostics from this  hospitalization (including imaging, microbiology, ancillary and laboratory) are listed below for reference.     Microbiology: Recent Results (from the past 240 hour(s))  Resp Panel by RT-PCR (Flu A&B, Covid) Nasopharyngeal Swab     Status: None   Collection Time: 04/19/20  1:22 PM   Specimen: Nasopharyngeal Swab; Nasopharyngeal(NP) swabs in vial transport medium  Result Value Ref Range Status   SARS Coronavirus 2 by RT PCR NEGATIVE NEGATIVE Final    Comment: (NOTE) SARS-CoV-2 target nucleic acids are NOT DETECTED.  The SARS-CoV-2 RNA is generally detectable in upper respiratory specimens during the acute phase of infection. The lowest concentration of SARS-CoV-2 viral copies this assay can detect is 138 copies/mL. A negative result does not preclude SARS-Cov-2 infection and should not be used as the sole basis for treatment or other patient management decisions. A negative result may occur with  improper specimen collection/handling, submission of specimen other than nasopharyngeal swab, presence of viral mutation(s) within the areas targeted by this assay, and inadequate number of viral copies(<138 copies/mL). A negative result must be combined with clinical observations, patient history, and epidemiological information. The expected result is Negative.  Fact Sheet for Patients:  EntrepreneurPulse.com.au  Fact Sheet for Healthcare Providers:  IncredibleEmployment.be  This test is no t yet approved or cleared by the [place] FDA and  has been authorized for detection and/or diagnosis of SARS-CoV-2 by FDA under an Emergency Use Authorization (EUA). This EUA will remain  in effect (meaning this test can be used) for the duration of the COVID-19 declaration under Section 564(b)(1) of the Act, 21 U.S.C.section 360bbb-3(b)(1), unless the authorization is terminated  or revoked sooner.       Influenza A by PCR NEGATIVE NEGATIVE Final    Influenza B by PCR NEGATIVE NEGATIVE Final    Comment: (NOTE) The Xpert Xpress SARS-CoV-2/FLU/RSV plus assay is intended as an aid in the diagnosis of influenza from Nasopharyngeal swab specimens and should not be used as a sole basis for treatment. Nasal washings and aspirates are unacceptable for Xpert Xpress SARS-CoV-2/FLU/RSV testing.  Fact Sheet for Patients: EntrepreneurPulse.com.au  Fact Sheet for Healthcare Providers: IncredibleEmployment.be  This test is not yet approved or cleared by the [place] FDA and has been authorized for detection and/or diagnosis of SARS-CoV-2 by FDA under an Emergency Use Authorization (EUA). This EUA will remain in effect (meaning this test can be used) for the duration of the COVID-19 declaration under Section 564(b)(1) of the Act, 21 U.S.C. section 360bbb-3(b)(1), unless the authorization is terminated or revoked.  Performed at [facility], [address] [place], [place] [address]      Labs: Basic Metabolic Panel: Recent Labs  Lab 04/19/20 1322 04/19/20 2025 04/21/20 0100 04/22/20 0323 04/23/20 0829 04/24/20 0412  NA 139  --  139 137 138 137  K 3.7  --  3.3* 3.7 3.7 3.4*  CL 106  --  108 107 106 105  CO2 25  --  25 21* 26 25  GLUCOSE 104*  --  90 90 108* 101*  BUN 23  --  11 12 11 16   CREATININE 0.88 0.75 0.87 0.91 0.87 0.90  CALCIUM 8.9  --  8.6* 8.7* 9.0 8.9  MG  --   --  2.0  --   --   --   PHOS  --   --  3.0  --   --   --    Liver Function Tests: Recent Labs  Lab 04/19/20 1322 04/21/20 0100 04/23/20 0829 04/24/20 0412  AST 22 18 25  34  ALT 15 14 19 27   ALKPHOS 156* 138* 136* 128*  BILITOT 0.7 0.8 0.8 0.8  PROT 7.0 5.9* 6.1* 5.8*  ALBUMIN 3.7 3.1* 3.2* 3.1*   CBC: Recent Labs  Lab 04/19/20 1322 04/19/20 2025 04/20/20 0741 04/21/20 0100 04/22/20 0323 04/23/20 0829 04/24/20 0412  WBC 8.1 7.9  --  7.1 7.6 4.4 5.8  NEUTROABS 5.7  --   --   --   --  2.8  3.6  HGB 13.3 12.0  --  11.5* 11.6* 12.0 12.1  HCT 39.8 37.0  --  35.1* 35.3* 36.5 35.4*  MCV 94.3 95.4  --  95.1 94.9 94.6 93.9  PLT 93* 77* 68* 77* 66* 59* 59*   CBG: Recent Labs  Lab 04/24/20 0837  GLUCAP 102*   Hgb A1c No results for input(s): HGBA1C in the last 72 hours. Lipid Profile No results for input(s): CHOL, HDL, LDLCALC, TRIG, CHOLHDL, LDLDIRECT in the last 72 hours. Thyroid function studies No results for input(s): TSH, T4TOTAL, T3FREE, THYROIDAB in the last 72 hours.  Invalid input(s): FREET3 Urinalysis    Component Value Date/Time   COLORURINE YELLOW 04/19/2020 1322   APPEARANCEUR CLEAR 04/19/2020 1322   LABSPEC 1.010 04/19/2020 1322   PHURINE 6.5 04/19/2020 1322   GLUCOSEU NEGATIVE 04/19/2020 1322   HGBUR MODERATE (A) 04/19/2020 1322   BILIRUBINUR NEGATIVE 04/19/2020 1322   KETONESUR NEGATIVE 04/19/2020 1322   PROTEINUR NEGATIVE 04/19/2020 1322   NITRITE NEGATIVE 04/19/2020 1322   LEUKOCYTESUR NEGATIVE 04/19/2020 1322    FURTHER DISCHARGE INSTRUCTIONS:   Get Medicines reviewed and adjusted: Please take all your medications with you for your next visit with your Primary MD   Laboratory/radiological data: Please request your Primary MD to go over all hospital tests and procedure/radiological results at the follow up, please ask your Primary MD to get all Hospital records sent to his/her office.   In some cases, they will be blood work, cultures and biopsy results pending at the time of your discharge. Please request that your primary care M.D. goes through all the records of your hospital data and follows up on these results.   Also Note the following: If you experience worsening of your admission symptoms, develop shortness of breath, life threatening emergency, suicidal or homicidal thoughts you must seek medical attention immediately by calling 911 or calling your MD immediately  if symptoms less severe.   You must read complete  instructions/literature along with all the possible adverse reactions/side effects for all the Medicines you take and that have been prescribed to you. Take any new Medicines after you have completely understood and accpet all the possible adverse reactions/side effects.    Do not drive when taking Pain medications or sleeping medications (Benzodaizepines)   Do not take more than prescribed Pain, Sleep and Anxiety Medications. It is not advisable to combine anxiety,sleep and pain medications without talking with your primary care practitioner   Special Instructions: If you have smoked or chewed Tobacco  in the last 2 yrs please stop smoking, stop any regular Alcohol  and or any Recreational drug use.   Wear Seat belts while driving.   Please note: You were cared for by a hospitalist  during your hospital stay. Once you are discharged, your primary care physician will handle any further medical issues. Please note that NO REFILLS for any discharge medications will be authorized once you are discharged, as it is imperative that you return to your primary care physician (or establish a relationship with a primary care physician if you do not have one) for your post hospital discharge needs so that they can reassess your need for medications and monitor your lab values.  Time coordinating discharge: 45 minutes  SIGNED:  Marzetta Board, MD, PhD 04/24/2020, 12:18 PM

## 2020-04-24 NOTE — Progress Notes (Signed)
Physical Therapy Treatment and Discharge Patient Details Name: Natalie [name] MRN: [id number] DOB: [birth year] Today's Date: 04/24/2020    History of Present Illness The  pt is a 70 yo female presenting 3/17 with confusion and difficulty word-finding. Imaging revealed L MCA infarcts with nearly occlusive thrombus in proximal M2 and M3. Plan for TEE and loop recorder replacement 3/21 at 2pm. PMH includes: CVA in Jan 2022, anxiety, hypothyroidism.    PT Comments    Pt performed gait training with quad cane working on sequencing and posture. Pt safe with use of cane and ambulated 300+ ft at mod I level on level surface without distractions. Pt also used cane to ascend and descend 3 steps with safety. Discussed activity level upon return home and safety considerations. Pt signing off, f/u with outpt PT.    Follow Up Recommendations  Outpatient PT;Supervision for mobility/OOB     Equipment Recommendations  Cane    Recommendations for Other Services       Precautions / Restrictions Precautions Precautions: Fall Restrictions Weight Bearing Restrictions: No    Mobility  Bed Mobility Overal bed mobility: Independent                  Transfers Overall transfer level: Modified independent Equipment used: None Transfers: Sit to/from Stand Sit to Stand: Modified independent (Device/Increase time)         General transfer comment: mod I from bed and toilet  Ambulation/Gait Ambulation/Gait assistance: Modified independent (Device/Increase time) Gait Distance (Feet): 300 Feet Assistive device: Quad cane Gait Pattern/deviations: Step-through pattern;Decreased stance time - right   Gait velocity interpretation: >2.62 ft/sec, indicative of community ambulatory General Gait Details: worked on moving cane at same time as LLE and keeping cane distance equal to step distance to avoid trunk flexion. Improved with practice. Daughter educated on this as well.   Stairs Stairs:  Yes Stairs assistance: Supervision Stair Management: One rail Right;Forwards;With cane;Alternating pattern Number of Stairs: 3 General stair comments: pt able to ascend/ descend with cane safely   Wheelchair Mobility    Modified Rankin (Stroke Patients Only) Modified Rankin (Stroke Patients Only) Pre-Morbid Rankin Score: Slight disability Modified Rankin: Moderate disability     Balance Overall balance assessment: Mild deficits observed, not formally tested                                          Cognition Arousal/Alertness: Awake/alert Behavior During Therapy: WFL for tasks assessed/performed;Impulsive Overall Cognitive Status: Within Functional Limits for tasks assessed                                        Exercises      General Comments General comments (skin integrity, edema, etc.): VSS, discussed activity level upon return home      Pertinent Vitals/Pain Pain Assessment: No/denies pain    Home Living                      Prior Function            PT Goals (current goals can now be found in the care plan section) Acute Rehab PT Goals Patient Stated Goal: return home PT Goal Formulation: All assessment and education complete, DC therapy Time For Goal Achievement: 05/05/20 Potential to Achieve Goals: Good Progress  towards PT goals: Goals met/education completed, patient discharged from PT    Frequency    Min 3X/week      PT Plan Current plan remains appropriate    Co-evaluation              AM-PAC PT "6 Clicks" Mobility   Outcome Measure  Help needed turning from your back to your side while in a flat bed without using bedrails?: None Help needed moving from lying on your back to sitting on the side of a flat bed without using bedrails?: None Help needed moving to and from a bed to a chair (including a wheelchair)?: None Help needed standing up from a chair using your arms (e.g., wheelchair or  bedside chair)?: None Help needed to walk in hospital room?: A Little Help needed climbing 3-5 steps with a railing? : A Little 6 Click Score: 22    End of Session   Activity Tolerance: Patient tolerated treatment well Patient left: with call bell/phone within reach;in bed;with family/visitor present;with nursing/sitter in room Nurse Communication: Mobility status PT Visit Diagnosis: Other abnormalities of gait and mobility (R26.89);Unsteadiness on feet (R26.81)     Time: 7356-7014 PT Time Calculation (min) (ACUTE ONLY): 27 min  Charges:  $Gait Training: 23-37 mins                     Leighton Roach, [place]  Pager [phone] Office [place] 04/24/2020, 3:33 PM

## 2020-04-25 ENCOUNTER — Telehealth: Payer: Self-pay

## 2020-04-25 ENCOUNTER — Encounter (HOSPITAL_COMMUNITY): Payer: Self-pay | Admitting: Cardiology

## 2020-04-25 ENCOUNTER — Other Ambulatory Visit: Payer: Self-pay | Admitting: Hematology and Oncology

## 2020-04-25 DIAGNOSIS — Z1273 Encounter for screening for malignant neoplasm of ovary: Secondary | ICD-10-CM | POA: Insufficient documentation

## 2020-04-25 DIAGNOSIS — R1900 Intra-abdominal and pelvic swelling, mass and lump, unspecified site: Secondary | ICD-10-CM | POA: Insufficient documentation

## 2020-04-25 DIAGNOSIS — N838 Other noninflammatory disorders of ovary, fallopian tube and broad ligament: Secondary | ICD-10-CM | POA: Insufficient documentation

## 2020-04-25 NOTE — Telephone Encounter (Cosign Needed)
Transition Care Management Unsuccessful Follow-up Telephone Call  Date of discharge and from where:  04/24/20 [facility] hospital  Attempts:  2nd Attempt  Reason for unsuccessful TCM follow-up call:  Left voice message x1

## 2020-04-25 NOTE — Anesthesia Postprocedure Evaluation (Signed)
Anesthesia Post Note  Patient: [name]  Procedure(s) Performed: TRANSESOPHAGEAL ECHOCARDIOGRAM (TEE) (N/A ) BUBBLE STUDY     Patient location during evaluation: Endoscopy Anesthesia Type: MAC Level of consciousness: patient cooperative and awake Pain management: pain level controlled Vital Signs Assessment: post-procedure vital signs reviewed and stable Respiratory status: spontaneous breathing, nonlabored ventilation, respiratory function stable and patient connected to nasal cannula oxygen Cardiovascular status: stable and blood pressure returned to baseline Postop Assessment: no apparent nausea or vomiting Anesthetic complications: no   No complications documented.  Last Vitals:  Vitals:   04/24/20 0753 04/24/20 1200  BP: (!) 143/90 (!) 152/87  Pulse: (!) 58 68  Resp: 12 17  Temp: 36.8 C 36.5 C  SpO2: 98% 97%    Last Pain:  Vitals:   04/24/20 1200  TempSrc: Oral  PainSc:                  Natalie [name]

## 2020-04-25 NOTE — Telephone Encounter (Signed)
Called and left a message asking her to call the office back regarding scheduling ultrasound prior to appt on 3/30.

## 2020-04-25 NOTE — Telephone Encounter (Signed)
I put in order for [facility]. Can you try to schedule that for Monday or Tuesday, then we can get prior auth

## 2020-04-25 NOTE — Telephone Encounter (Signed)
She called back is is willing to do ultrasounds prior to 3/30 appts. She is aware that she needs a full bladder for the abdominal ultrasound. She is willing to go to any [facility] location.

## 2020-04-25 NOTE — Telephone Encounter (Signed)
Called and schedule ultrasound on 3/29, arrive at 1015 for 1030 appt, arrive with a full bladder to [facility].  Called and given above appt details to [place]. She verbalized understanding.

## 2020-04-26 ENCOUNTER — Other Ambulatory Visit: Payer: Self-pay | Admitting: Hematology and Oncology

## 2020-04-26 DIAGNOSIS — R1909 Other intra-abdominal and pelvic swelling, mass and lump: Secondary | ICD-10-CM | POA: Insufficient documentation

## 2020-04-27 ENCOUNTER — Telehealth: Payer: Self-pay

## 2020-04-27 NOTE — Telephone Encounter (Cosign Needed)
Transition Care Management Unsuccessful Follow-up Telephone Call  Date of discharge and from where:  04/24/20  Attempts:  3rd Attempt  Reason for unsuccessful TCM follow-up call:  Left voice message pt called back 04/27/20, However I was still unable to connect for TCM encouraged pt to call for a HFU with Dr Ancil Boozer. Unsure if [place] [employer] may have spoken to her as well.

## 2020-05-01 ENCOUNTER — Ambulatory Visit (HOSPITAL_COMMUNITY)
Admission: RE | Admit: 2020-05-01 | Discharge: 2020-05-01 | Disposition: A | Discharge status: Home / Self Care | Payer: Medicare Other | Source: Ambulatory Visit | Attending: Hematology and Oncology | Admitting: Hematology and Oncology

## 2020-05-01 ENCOUNTER — Other Ambulatory Visit: Payer: Self-pay

## 2020-05-01 DIAGNOSIS — D259 Leiomyoma of uterus, unspecified: Secondary | ICD-10-CM | POA: Diagnosis not present

## 2020-05-01 DIAGNOSIS — R1900 Intra-abdominal and pelvic swelling, mass and lump, unspecified site: Secondary | ICD-10-CM | POA: Insufficient documentation

## 2020-05-01 DIAGNOSIS — N838 Other noninflammatory disorders of ovary, fallopian tube and broad ligament: Secondary | ICD-10-CM | POA: Diagnosis not present

## 2020-05-01 DIAGNOSIS — N852 Hypertrophy of uterus: Secondary | ICD-10-CM | POA: Diagnosis not present

## 2020-05-01 DIAGNOSIS — N83202 Unspecified ovarian cyst, left side: Secondary | ICD-10-CM | POA: Diagnosis not present

## 2020-05-01 IMAGING — US US PELVIS COMPLETE WITH TRANSVAGINAL
1 series · 14 of 25 positions shown · non-contrast
Comparison: CT [DATE]

CLINICAL DATA: Pelvic mass on CT

EXAM:
TRANSABDOMINAL AND TRANSVAGINAL ULTRASOUND OF PELVIS
TECHNIQUE: Both transabdominal and transvaginal ultrasound examinations of the
pelvis were performed. Transabdominal technique was performed for
global imaging of the pelvis including uterus, ovaries, adnexal
regions, and pelvic cul-de-sac. It was necessary to proceed with
endovaginal exam following the transabdominal exam to visualize the
uterus endometrium ovaries.

[Series 1: us pelvic complete with transvaginal · 14 of 91 slices shown]
[im 1/91]
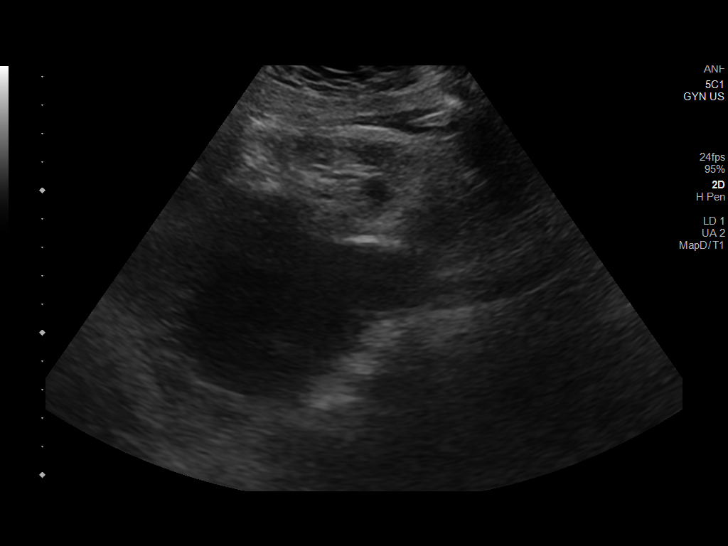
[im 8/91]
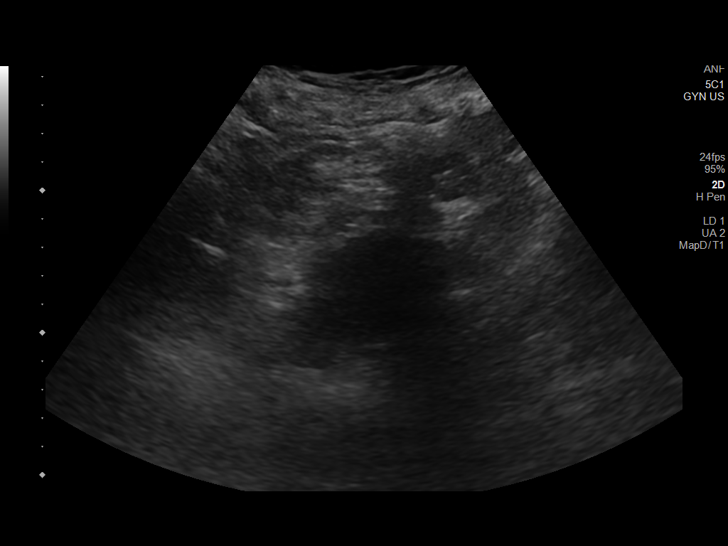
[im 16/91]
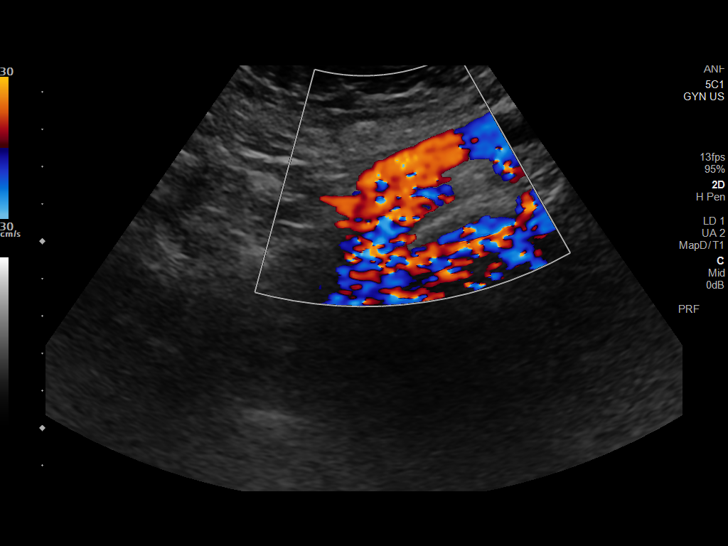
[im 23/91]
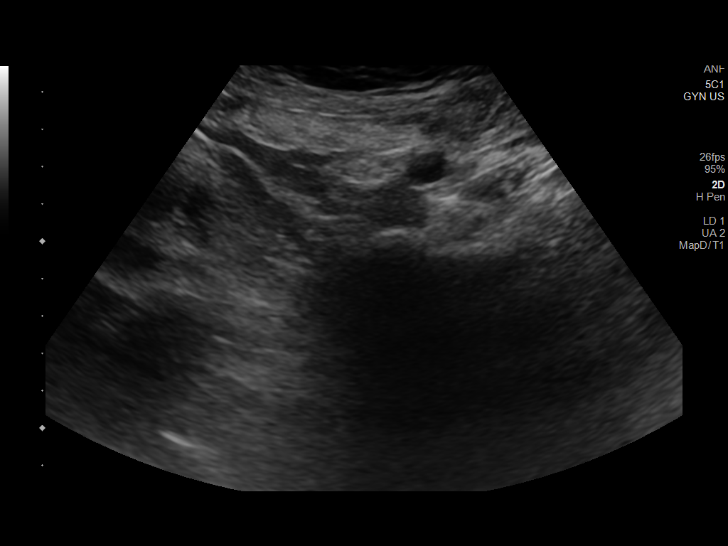
[im 31/91]
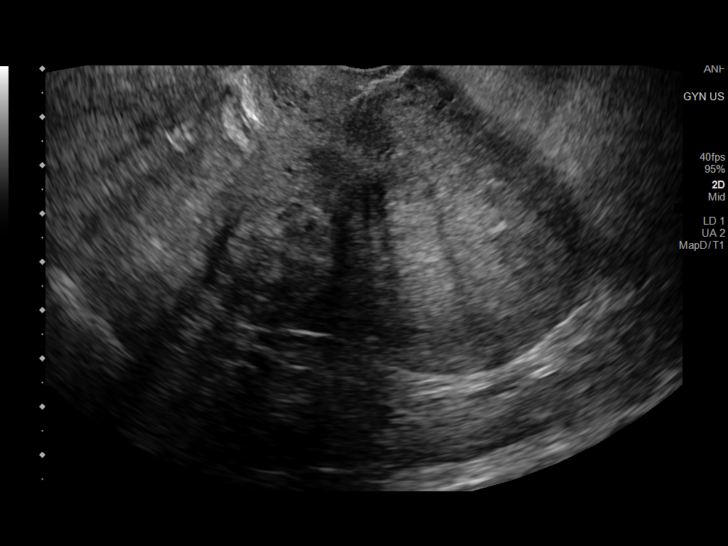
[im 34/91]
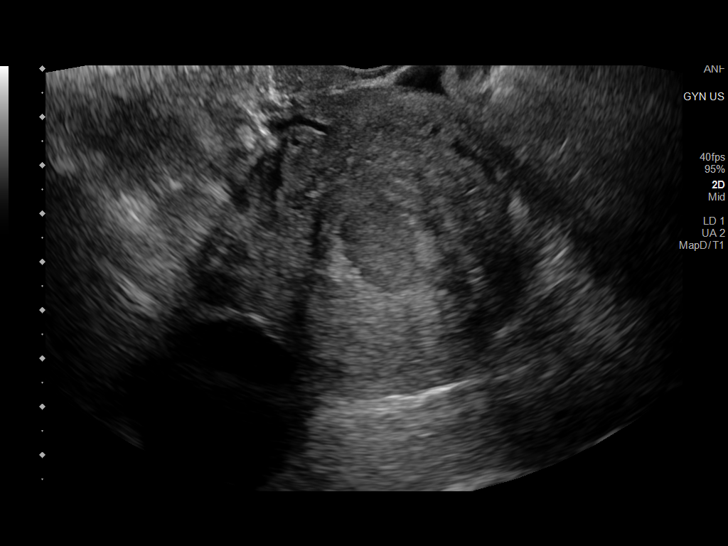
[im 42/91]
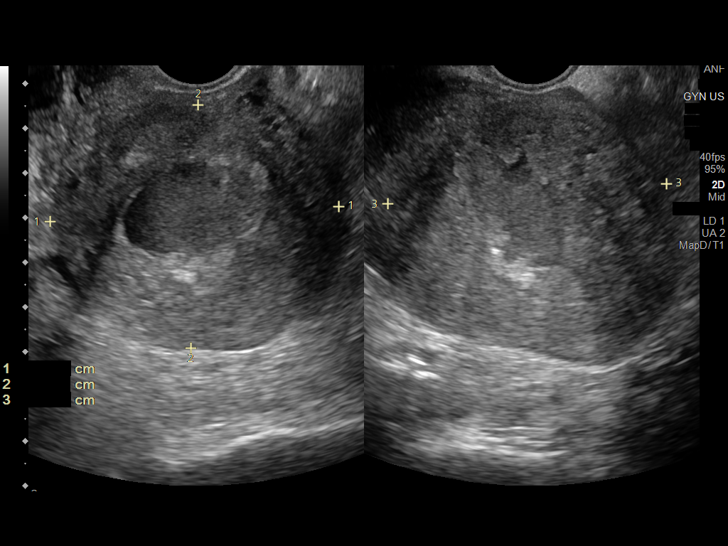
[im 49/91]
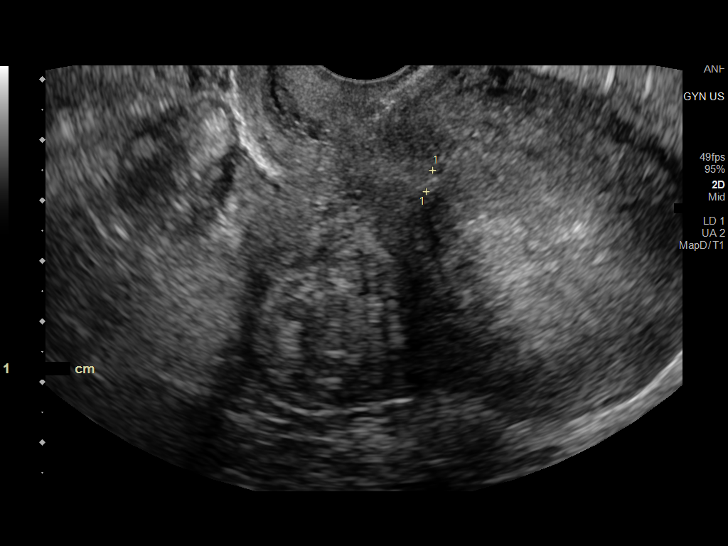
[im 57/91]
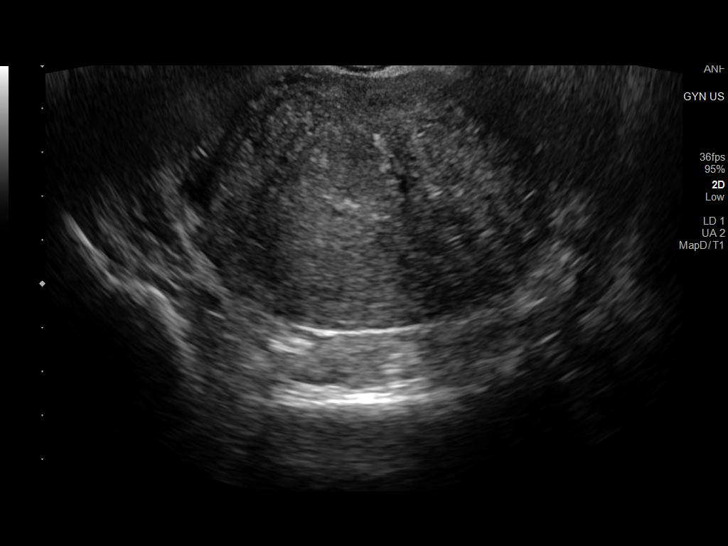
[im 61/91]
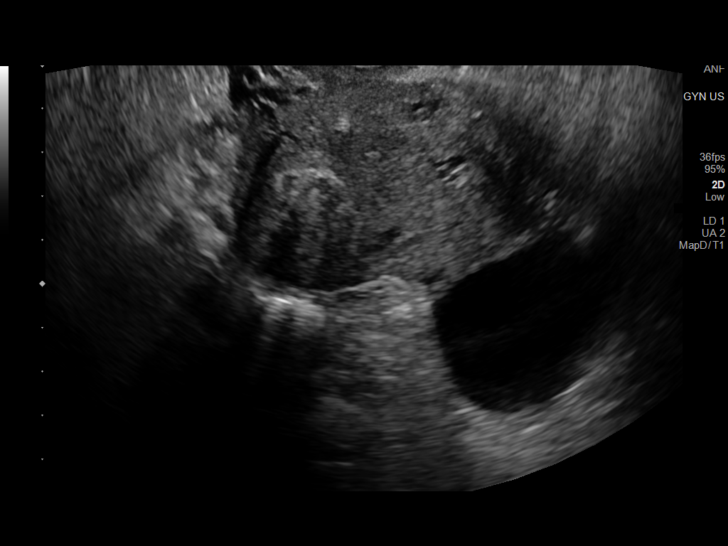
[im 68/91]
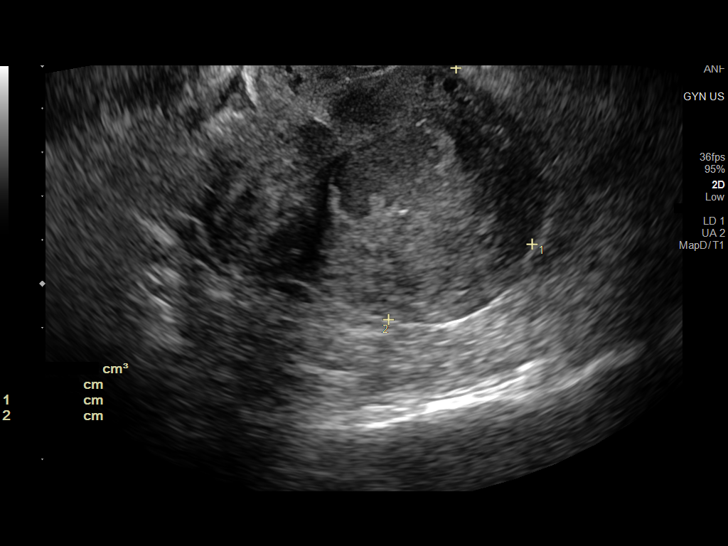
[im 76/91]
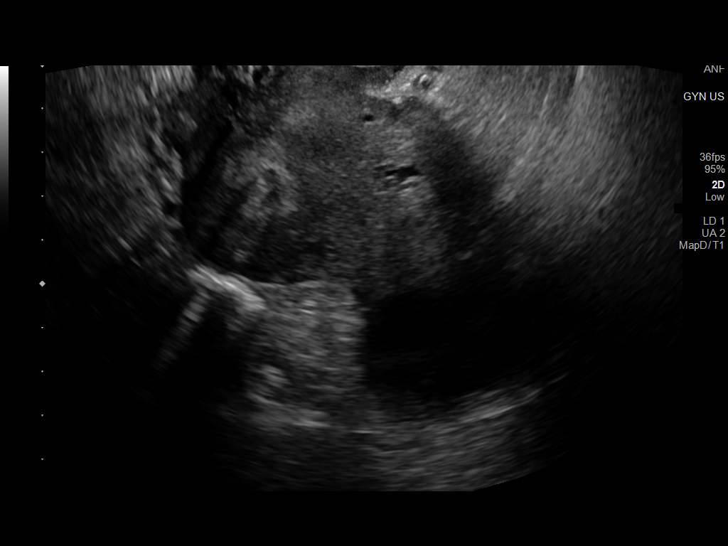
[im 83/91]
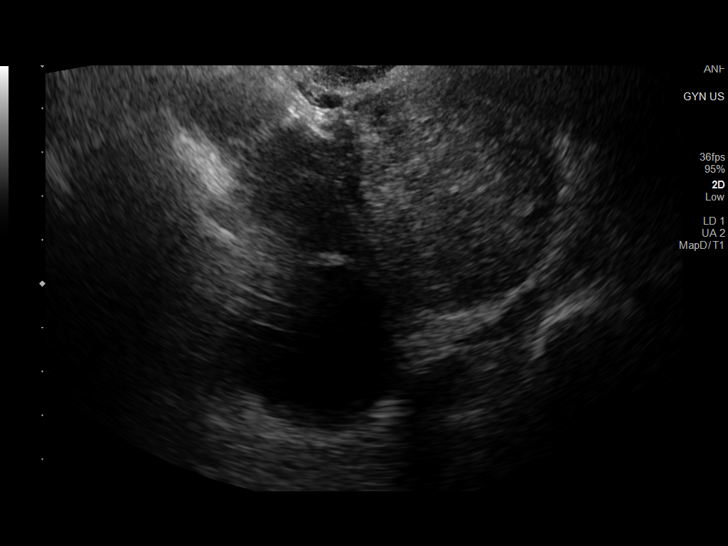
[im 91/91]
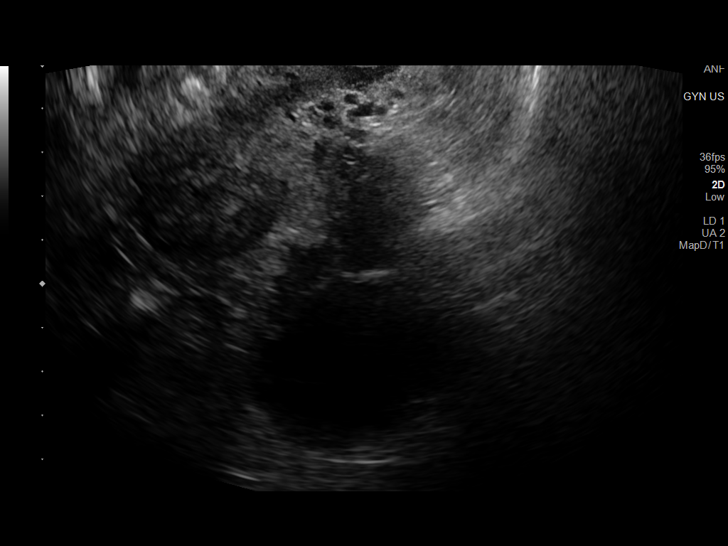

[14 of 25 positions shown; findings below may reference images not displayed]

FINDINGS: Uterus

Measurements: 8 x 5.9 x 7.8 cm = volume: 192.9 mL. Lobulated contour
with multiple masses. Right anterior uterine corpus mass measures
3.5 x 3.1 x 3.7 cm. Large fundal mass measuring 6.5 x 5.4 x 6.3 cm.

Endometrium

Thickness: 3.7 mm.  No focal abnormality visualized.

Right ovary

Not seen

Left ovary

Measurements: 7 x 5.4 x 4.9 cm = volume: 98.1 mL. Dominant septated
cyst measuring 5.8 x 4.1 x 4 cm.

Other findings

Trace free fluid.
IMPRESSION: 1. Enlarged lobulated uterus with multiple fibroids.
2. 5.8 cm dominant septated cyst in the left adnexa. Surgical
consultation is recommended.

## 2020-05-02 ENCOUNTER — Inpatient Hospital Stay: Payer: Medicare Other | Attending: Hematology and Oncology

## 2020-05-02 ENCOUNTER — Encounter: Payer: Self-pay | Admitting: Hematology and Oncology

## 2020-05-02 ENCOUNTER — Encounter: Payer: Self-pay | Admitting: Family Medicine

## 2020-05-02 ENCOUNTER — Inpatient Hospital Stay (HOSPITAL_BASED_OUTPATIENT_CLINIC_OR_DEPARTMENT_OTHER): Payer: Medicare Other | Admitting: Hematology and Oncology

## 2020-05-02 DIAGNOSIS — Z8673 Personal history of transient ischemic attack (TIA), and cerebral infarction without residual deficits: Secondary | ICD-10-CM | POA: Insufficient documentation

## 2020-05-02 DIAGNOSIS — R1909 Other intra-abdominal and pelvic swelling, mass and lump: Secondary | ICD-10-CM

## 2020-05-02 DIAGNOSIS — Z87891 Personal history of nicotine dependence: Secondary | ICD-10-CM | POA: Diagnosis not present

## 2020-05-02 DIAGNOSIS — N838 Other noninflammatory disorders of ovary, fallopian tube and broad ligament: Secondary | ICD-10-CM

## 2020-05-02 DIAGNOSIS — D693 Immune thrombocytopenic purpura: Secondary | ICD-10-CM | POA: Diagnosis not present

## 2020-05-02 DIAGNOSIS — Z1273 Encounter for screening for malignant neoplasm of ovary: Secondary | ICD-10-CM

## 2020-05-02 DIAGNOSIS — D696 Thrombocytopenia, unspecified: Secondary | ICD-10-CM | POA: Insufficient documentation

## 2020-05-02 DIAGNOSIS — I639 Cerebral infarction, unspecified: Secondary | ICD-10-CM | POA: Diagnosis not present

## 2020-05-02 DIAGNOSIS — R1900 Intra-abdominal and pelvic swelling, mass and lump, unspecified site: Secondary | ICD-10-CM

## 2020-05-02 DIAGNOSIS — D259 Leiomyoma of uterus, unspecified: Secondary | ICD-10-CM | POA: Insufficient documentation

## 2020-05-02 LAB — CBC WITH DIFFERENTIAL/PLATELET
Abs Immature Granulocytes: 0 10*3/uL (ref 0.00–0.07)
Basophils Absolute: 0 10*3/uL (ref 0.0–0.1)
Basophils Relative: 0 %
Eosinophils Absolute: 0.2 10*3/uL (ref 0.0–0.5)
Eosinophils Relative: 3 %
HCT: 40.4 % (ref 36.0–46.0)
Hemoglobin: 13.4 g/dL (ref 12.0–15.0)
Immature Granulocytes: 0 %
Lymphocytes Relative: 33 %
Lymphs Abs: 1.8 10*3/uL (ref 0.7–4.0)
MCH: 31.3 pg (ref 26.0–34.0)
MCHC: 33.2 g/dL (ref 30.0–36.0)
MCV: 94.4 fL (ref 80.0–100.0)
Monocytes Absolute: 0.3 10*3/uL (ref 0.1–1.0)
Monocytes Relative: 5 %
Neutro Abs: 3.3 10*3/uL (ref 1.7–7.7)
Neutrophils Relative %: 59 %
Platelets: 64 10*3/uL — ABNORMAL LOW (ref 150–400)
RBC: 4.28 MIL/uL (ref 3.87–5.11)
RDW: 12.2 % (ref 11.5–15.5)
Smear Review: NORMAL
WBC: 5.6 10*3/uL (ref 4.0–10.5)
nRBC: 0 % (ref 0.0–0.2)

## 2020-05-02 LAB — ABO/RH: ABO/RH(D): A POS

## 2020-05-02 MED ORDER — PREDNISONE 20 MG PO TABS: 60.0000 mg | ORAL_TABLET | Freq: Every day | ORAL | 1 refills | Status: DC

## 2020-05-02 NOTE — Progress Notes (Signed)
[facility] progress notes  Patient Care Team: [name], MD as PCP - General (Family Medicine) Thurmond Butts Orson Ape, MD as Attending Physician (Psychiatry)  CHIEF COMPLAINTS/PURPOSE OF VISIT:  Hospital follow-up for thrombocytopenia and pelvic mass  HISTORY OF PRESENTING ILLNESS:  Natalie [name] 70 y.o. female is seen in the clinic today as a hospital follow-up Please see my consult note dated April 23, 2020 for further details  She presented with difficulty speaking and diplopia in this hospital stay.  She was also having difficulty thinking.  A code stroke was called.  CT head showed no acute intracranial hemorrhage and no definite acute infarction.  CTA head and neck showed nearly occlusive thrombus at the bifurcation of a proximal M2 MCA branch approximately 6 mm from the MCA bifurcation, subsequent occlusion of a proximal left M3 MCA branch, no stenosis in the neck.  MRI brain without contrast showed patchy small volume acute ischemic nonhemorrhagic posterior left MCA distribution infarcts likely reflecting embolic shower.   She was noted to have thrombocytopenia on admission with a platelet count of 93,000. Platelet count slowly drifting down this admission 93>77>68>77>66>59.  The most recent CBC available to me performed prior to this admission was done on 02/28/2020 and at that time, her platelet count was 108,000.  The patient is receiving several antiplatelet and anticoagulant medications including aspirin 81 mg daily, Plavix 75 mg daily, Lovenox 40 mg daily.  Review of her chart show that the aspirin and Plavix were started at the time of her initial CVA in January 2022.  She tells me that she was off the Plavix for short period time but recently restarted on it just prior to this admission.  The patient underwent TEE earlier today.  Report reviewed and there was no source of embolus but moderate LAE is suggestive of possible PAF.  Feeling better overall.   She is not having any diplopia today.  Her speech is improving but she still has some slurred speech and difficulty with word finding.  She currently denies bleeding.  She is not having any fevers, chills, headaches, chest pain, shortness of breath, abdominal pain, nausea, vomiting. Hematology was asked to see the patient make recommendations regarding her thrombocytopenia. In the hospital, she had extensive evaluation Serum vitamin O27 and folic acid level were normal CT imaging of abdomen and pelvis were performed to rule out splenomegaly and that revealed pelvic mass The patient did not receive platelet transfusion while hospitalized She was discharged home on medical management  Since she left the hospital, she feels fine The patient denies any recent signs or symptoms of bleeding such as spontaneous epistaxis, hematuria or hematochezia.   MEDICAL HISTORY:  Past Medical History:  Diagnosis Date  . Acute bronchospasm due to viral infection   . Anemia    history of  . CVA (cerebral vascular accident) ([place]) 02/28/2020  . Depressive disorder   . Epistaxis   . Fatigue   . Hearing loss   . Hypothyroidism   . Memory change   . Migraine   . Numbness and tingling   . Obesity (BMI 30.0-34.9)   . Osteoarthritis   . Osteoporosis   . Polyuria   . Premature menopause   . Seizures ([place])    history of mini seizures, possible migraine induced    SURGICAL HISTORY: Past Surgical History:  Procedure Laterality Date  . ABLATION  2006  . BUBBLE STUDY  04/23/2020   Procedure: BUBBLE STUDY;  Surgeon: Loralie Champagne  S, MD;  Location: [place];  Service: Cardiovascular;;  . CATARACT EXTRACTION W/ INTRAOCULAR LENS IMPLANT Bilateral   . COLONOSCOPY    . COLONOSCOPY WITH PROPOFOL N/A 12/22/2018   Procedure: COLONOSCOPY WITH PROPOFOL;  Surgeon: Virgel Manifold, MD;  Location: [facility] ENDOSCOPY;  Service: Endoscopy;  Laterality: N/A;  . EYE SURGERY  Spring 2018   Cataracts both eyes  . LOOP  RECORDER INSERTION N/A 04/23/2020   Procedure: LOOP RECORDER INSERTION;  Surgeon: Evans Lance, MD;  Location: [place] [facility];  Service: Cardiovascular;  Laterality: N/A;  . TEE WITHOUT CARDIOVERSION N/A 04/23/2020   Procedure: TRANSESOPHAGEAL ECHOCARDIOGRAM (TEE);  Surgeon: Larey Dresser, MD;  Location: [facility] ENDOSCOPY;  Service: Cardiovascular;  Laterality: N/A;  . TONSILLECTOMY      SOCIAL HISTORY: Social History   Socioeconomic History  . Marital status: Single    Spouse name: Not on file  . Number of children: 1  . Years of education: Not on file  . Highest education level: Bachelor's degree (e.g., BA, AB, BS)  Occupational History  . Occupation: retired   Tobacco Use  . Smoking status: Former Smoker    Packs/day: 0.50    Years: 6.00    Pack years: 3.00    Types: Cigarettes    Quit date: 02/11/1974    Years since quitting: 46.2  . Smokeless tobacco: Never Used  Vaping Use  . Vaping Use: Never used  Substance and Sexual Activity  . Alcohol use: Yes    Alcohol/week: 0.0 standard drinks    Comment: 2-3 drinks monthly  . Drug use: Never  . Sexual activity: Not Currently    Comment: Don't use not sexually active  Other Topics Concern  . Not on file  Social History Narrative   Raised an adopted child on her own   Working part time reviewed documents   Social Determinants of Health   Financial Resource Strain: Low Risk   . Difficulty of Paying Living Expenses: Not hard at all  Food Insecurity: No Food Insecurity  . Worried About Charity fundraiser in the Last Year: Never true  . Ran Out of Food in the Last Year: Never true  Transportation Needs: No Transportation Needs  . Lack of Transportation (Medical): No  . Lack of Transportation (Non-Medical): No  Physical Activity: Inactive  . Days of Exercise per Week: 0 days  . Minutes of Exercise per Session: 0 min  Stress: No Stress Concern Present  . Feeling of Stress : Not at all  Social Connections: Moderately  Integrated  . Frequency of Communication with Friends and Family: More than three times a week  . Frequency of Social Gatherings with Friends and Family: Twice a week  . Attends Religious Services: More than 4 times per year  . Active Member of Clubs or Organizations: Yes  . Attends Archivist Meetings: More than 4 times per year  . Marital Status: Never married  Intimate Partner Violence: Not At Risk  . Fear of Current or Ex-Partner: No  . Emotionally Abused: No  . Physically Abused: No  . Sexually Abused: No    FAMILY HISTORY: Family History  Problem Relation Age of Onset  . Early death Father        suicide  . Depression Father   . Diabetes Father   . Cancer Father        prostate  . Hearing loss Father        due to war  .  Alzheimer's disease Mother   . Heart disease Brother   . Atrial fibrillation Brother   . Heart disease Maternal Aunt   . Dementia Maternal Aunt   . Heart disease Maternal Uncle   . Dementia Maternal Grandmother   . Heart disease Brother   . Atrial fibrillation Brother     ALLERGIES:  is allergic to sulfamethoxazole-trimethoprim.  MEDICATIONS:  Current Outpatient Medications  Medication Sig Dispense Refill  . predniSONE (DELTASONE) 20 MG tablet Take 3 tablets (60 mg total) by mouth daily with breakfast. 90 tablet 1  . aspirin EC 81 MG tablet Take 1 tablet (81 mg total) by mouth daily for 21 days. 21 tablet 0  . atorvastatin (LIPITOR) 40 MG tablet Take 1 tablet (40 mg total) by mouth daily. 90 tablet 1  . buPROPion (WELLBUTRIN SR) 150 MG 12 hr tablet Take 150 mg by mouth 2 (two) times daily.    . citalopram (CELEXA) 40 MG tablet Take 40 mg by mouth daily.    . clindamycin-benzoyl peroxide (BENZACLIN) gel Apply topically 2 (two) times daily. (Patient taking differently: Apply 1 application topically 2 (two) times daily as needed (facial acne).) 50 g 0  . clopidogrel (PLAVIX) 75 MG tablet Take 1 tablet (75 mg total) by mouth daily. 30 tablet  1  . Cyanocobalamin (VITAMIN B-12) 1000 MCG SUBL Place 1,000 mcg under the tongue at bedtime.    Marland Kitchen levothyroxine (SYNTHROID) 75 MCG tablet Take 1 tablet (75 mcg total) by mouth daily before breakfast. 90 tablet 1  . Multiple Vitamin (MULTIVITAMIN WITH MINERALS) TABS tablet Take 1 tablet by mouth daily.    . Omega-3 Fatty Acids (FISH OIL) 1000 MG CAPS Take 1 capsule by mouth daily.    . polyvinyl alcohol (ARTIFICIAL TEARS) 1.4 % ophthalmic solution Place 1 drop into both eyes daily as needed for dry eyes.    Marland Kitchen topiramate (TOPAMAX) 25 MG tablet Take 25 mg by mouth daily.     . Vitamin D, Ergocalciferol, (DRISDOL) 1.25 MG (50000 UNIT) CAPS capsule TAKE 1 CAPSULE BY MOUTH EVERY 7 DAYS (Patient taking differently: Take 50,000 Units by mouth every Tuesday.) 12 capsule 1   No current facility-administered medications for this visit.    REVIEW OF SYSTEMS:   Constitutional: Denies fevers, chills or abnormal night sweats Eyes: Denies blurriness of vision, double vision or watery eyes Ears, nose, mouth, throat, and face: Denies mucositis or sore throat Respiratory: Denies cough, dyspnea or wheezes Cardiovascular: Denies palpitation, chest discomfort or lower extremity swelling Gastrointestinal:  Denies nausea, heartburn or change in bowel habits Skin: Denies abnormal skin rashes Lymphatics: Denies new lymphadenopathy or easy bruising Neurological:Denies numbness, tingling or new weaknesses Behavioral/Psych: Mood is stable, no new changes  All other systems were reviewed with the patient and are negative.  PHYSICAL EXAMINATION: ECOG PERFORMANCE STATUS: 1 - Symptomatic but completely ambulatory  Vitals:   05/02/20 1256  BP: 130/81  Pulse: 78  Resp: 18  Temp: 98 F (36.7 C)  SpO2: 100%   Filed Weights   05/02/20 1256  Weight: 206 lb 12.8 oz (93.8 kg)    GENERAL:alert, no distress and comfortable SKIN: skin color, texture, turgor are normal, no rashes or significant lesions EYES: normal,  conjunctiva are pink and non-injected, sclera clear OROPHARYNX:no exudate, normal lips, buccal mucosa, and tongue  NECK: supple, thyroid normal size, non-tender, without nodularity LYMPH:  no palpable lymphadenopathy in the cervical, axillary or inguinal LUNGS: clear to auscultation and percussion with normal breathing effort HEART: regular rate &  rhythm and no murmurs without lower extremity edema ABDOMEN:abdomen soft, non-tender and normal bowel sounds Musculoskeletal:no cyanosis of digits and no clubbing  PSYCH: alert & oriented x 3 with fluent speech NEURO: no focal motor/sensory deficits  LABORATORY DATA:  I have reviewed the data as listed Lab Results  Component Value Date   WBC 5.6 05/02/2020   HGB 13.4 05/02/2020   HCT 40.4 05/02/2020   MCV 94.4 05/02/2020   PLT 64 (L) 05/02/2020   Recent Labs    05/10/19 1458 01/25/20 0820 02/28/20 0950 04/21/20 0100 04/22/20 0323 04/23/20 0829 04/24/20 0412  NA 141 141   < > 139 137 138 137  K 4.1 3.4*   < > 3.3* 3.7 3.7 3.4*  CL 107 107   < > 108 107 106 105  CO2 27 25   < > 25 21* 26 25  GLUCOSE 90 98   < > 90 90 108* 101*  BUN 16 20   < > 11 12 11 16   CREATININE 0.75 0.94   < > 0.87 0.91 0.87 0.90  CALCIUM 9.1 9.2   < > 8.6* 8.7* 9.0 8.9  GFRNONAA 81 62   < > >60 >60 >60 >60  GFRAA 94 72  --   --   --   --   --   PROT 6.3 7.1   < > 5.9*  --  6.1* 5.8*  ALBUMIN  --   --    < > 3.1*  --  3.2* 3.1*  AST 20 17   < > 18  --  25 34  ALT 17 11   < > 14  --  19 27  ALKPHOS  --   --    < > 138*  --  136* 128*  BILITOT 0.5 0.6   < > 0.8  --  0.8 0.8   < > = values in this interval not displayed.    RADIOGRAPHIC STUDIES: I have reviewed CT imaging with the patient I have personally reviewed the radiological images as listed and agreed with the findings in the report. CT ANGIOGRAM HEAD NECK W WO CONTRAST  Result Date: 04/19/2020 CLINICAL DATA:  Code stroke follow-up EXAM: CT ANGIOGRAPHY HEAD AND NECK TECHNIQUE: Multidetector CT  imaging of the head and neck was performed using the standard protocol during bolus administration of intravenous contrast. Multiplanar CT image reconstructions and MIPs were obtained to evaluate the vascular anatomy. Carotid stenosis measurements (when applicable) are obtained utilizing NASCET criteria, using the distal internal carotid diameter as the denominator. CONTRAST:  88mL OMNIPAQUE IOHEXOL 350 MG/ML SOLN COMPARISON:  02/29/2020 FINDINGS: CTA NECK Aortic arch: Great vessel origins are patent. Right carotid system: Patent.  No stenosis at the ICA origin. Left carotid system: Patent.  No stenosis at the ICA origin. Vertebral arteries: Patent. Right vertebral artery is dominant. No stenosis. Skeleton: Stable degenerative changes of the cervical spine. Other neck: No new finding. Upper chest: No new finding. Review of the MIP images confirms the above findings CTA HEAD Anterior circulation: Intracranial internal carotid arteries are patent. Left M1 MCA is patent. There is nearly occlusive thrombus within a proximal M2 branch approximately 6 mm from the bifurcation. Subsequent occlusion of a proximal left M3 branch. Right middle and both anterior cerebral arteries are patent. Posterior circulation: Intracranial internal carotid arteries are patent. Basilar artery is patent. Major cerebellar artery origins are patent. Posterior cerebral arteries are patent. Left posterior communicating artery is present. Venous sinuses: Patent as allowed  by contrast bolus timing. Review of the MIP images confirms the above findings IMPRESSION: Nearly occlusive thrombus at the bifurcation of a proximal M2 MCA branch approximately 6 mm from the MCA bifurcation. Subsequent occlusion of a proximal left M3 MCA branch. No stenosis in the neck. These results were called by telephone at the time of interpretation on 04/19/2020 at 2:01 pm to provider [facility] , who verbally acknowledged these results. Electronically Signed   By:  Macy Mis M.D.   On: 04/19/2020 14:10   MR BRAIN WO CONTRAST  Result Date: 04/19/2020 CLINICAL DATA:  Initial evaluation for acute stroke. EXAM: MRI HEAD WITHOUT CONTRAST TECHNIQUE: Multiplanar, multiecho pulse sequences of the brain and surrounding structures were obtained without intravenous contrast. COMPARISON:  Prior CTs from earlier the same day as well as previous MRI from 02/28/2020. FINDINGS: Brain: Generalized age appropriate cerebral volume. Mild chronic microvascular ischemic disease again noted. Patchy small volume foci of restricted diffusion seen involving the cortical and subcortical aspect of the posterior left frontoparietal and temporal region, consistent with posterior left MCA distribution infarcts. For reference purposes, the largest of these foci seen at the posterior left temporal region and measures 8 mm. Findings are embolic in distribution. No associated hemorrhage or mass effect. Additional apparent subcentimeter focus of mild diffusion abnormality at the posterior right frontoparietal centrum semi ovale favored to reflect T2 shine through (series 2, image 34). No other evidence for acute or subacute ischemia. Gray-white matter differentiation otherwise maintained. No acute intracranial hemorrhage. Small focus of susceptibility artifact at the posterior right temporal region noted, consistent with a small chronic microhemorrhage. Additional minimal residual chronic blood products noted about the occipital regions related to recently identified ischemic infarcts. No mass lesion, midline shift or mass effect. No hydrocephalus or extra-axial fluid collection. Pituitary gland suprasellar region within normal limits. Midline structures intact. Vascular: Hypoplastic left vertebral artery not well seen. Major intracranial vascular flow voids are otherwise maintained. Skull and upper cervical spine: Craniocervical junction within normal limits. Bone marrow signal intensity normal. No  focal marrow replacing lesion. No scalp soft tissue abnormality. Sinuses/Orbits: Patient status post bilateral ocular lens replacement. Globes and orbital soft tissues demonstrate no acute finding. Paranasal sinuses are largely clear. No significant mastoid effusion. Inner ear structures grossly normal. Other: None. IMPRESSION: 1. Patchy small volume acute ischemic nonhemorrhagic posterior left MCA distribution infarcts, likely reflecting an embolic shower. No associated hemorrhage or mass effect. 2. No other acute intracranial abnormality. 3. Underlying mild chronic microvascular ischemic disease. Electronically Signed   By: Jeannine Boga M.D.   On: 04/19/2020 23:39   CT ABDOMEN PELVIS W CONTRAST  Result Date: 04/23/2020 CLINICAL DATA:  Splenomegaly EXAM: CT ABDOMEN AND PELVIS WITH CONTRAST TECHNIQUE: Multidetector CT imaging of the abdomen and pelvis was performed using the standard protocol following bolus administration of intravenous contrast. CONTRAST:  162mL OMNIPAQUE IOHEXOL 300 MG/ML  SOLN COMPARISON:  Ultrasound 04/20/2020. FINDINGS: Lower chest: No acute abnormality Hepatobiliary: Gallstones noted within the gallbladder. These measure up to 1.7 cm. No wall thickening. No biliary ductal dilatation. No focal hepatic abnormality. Pancreas: No focal abnormality or ductal dilatation. Spleen: Spleen is normal size with a craniocaudal length of 7.9 cm. No focal abnormality. Adrenals/Urinary Tract: No adrenal abnormality. No focal renal abnormality. No stones or hydronephrosis. Urinary bladder is unremarkable. Stomach/Bowel: Stomach, large and small bowel grossly unremarkable. Normal appendix. Vascular/Lymphatic: No evidence of aneurysm or adenopathy. Reproductive: Enlarged uterus with numerous fibroids. Complex septated cystic mass in the left adnexal region  measuring up to 5 cm. No right adnexal mass. Other: No free fluid or free air. Musculoskeletal: No acute bony abnormality. Degenerative changes  in the lumbar spine. IMPRESSION: Cholelithiasis. No evidence of cholecystitis crash that no evidence of acute cholecystitis. Normal spleen size and appearance. Enlarged fibroid uterus. Complex septated cystic mass in the left ovary measuring up to 5 cm. This could be further evaluated with pelvic ultrasound. Electronically Signed   By: Rolm Baptise M.D.   On: 04/23/2020 21:28   EP PPM/ICD IMPLANT  Result Date: 04/23/2020 CONCLUSIONS:  1. Successful implantation of a [employer] Reveal LINQ implantable loop recorder for cryptogenic stroke  2. No early apparent complications. Cristopher Peru, MD 04/23/2020 3:27 PM   US PELVIC COMPLETE WITH TRANSVAGINAL  Result Date: 05/01/2020 CLINICAL DATA:  Pelvic mass on CT EXAM: TRANSABDOMINAL AND TRANSVAGINAL ULTRASOUND OF PELVIS TECHNIQUE: Both transabdominal and transvaginal ultrasound examinations of the pelvis were performed. Transabdominal technique was performed for global imaging of the pelvis including uterus, ovaries, adnexal regions, and pelvic cul-de-sac. It was necessary to proceed with endovaginal exam following the transabdominal exam to visualize the uterus endometrium ovaries. COMPARISON:  CT 04/23/2020 FINDINGS: Uterus Measurements: 8 x 5.9 x 7.8 cm = volume: 192.9 mL. Lobulated contour with multiple masses. Right anterior uterine corpus mass measures 3.5 x 3.1 x 3.7 cm. Large fundal mass measuring 6.5 x 5.4 x 6.3 cm. Endometrium Thickness: 3.7 mm.  No focal abnormality visualized. Right ovary Not seen Left ovary Measurements: 7 x 5.4 x 4.9 cm = volume: 98.1 mL. Dominant septated cyst measuring 5.8 x 4.1 x 4 cm. Other findings Trace free fluid. IMPRESSION: 1. Enlarged lobulated uterus with multiple fibroids. 2. 5.8 cm dominant septated cyst in the left adnexa. Surgical consultation is recommended. Electronically Signed   By: Donavan Foil M.D.   On: 05/01/2020 21:30   CT HEAD CODE STROKE WO CONTRAST  Addendum Date: 04/19/2020   ADDENDUM REPORT: 04/19/2020  13:56 ADDENDUM: Under ASPECT score calculation there is a dictation error. Should state 7 rather than 6. Electronically Signed   By: Macy Mis M.D.   On: 04/19/2020 13:56   Result Date: 04/19/2020 CLINICAL DATA:  Code stroke. EXAM: CT HEAD WITHOUT CONTRAST TECHNIQUE: Contiguous axial images were obtained from the base of the skull through the vertex without intravenous contrast. COMPARISON:  None. FINDINGS: Brain: No acute intracranial hemorrhage, mass effect, or edema. No definite new loss of gray-white differentiation. Ventricles and sulci are normal in size and configuration. No extra-axial collection. Vascular: Focal density along a left M2 MCA branch within the sylvian fissure. Skull: Unremarkable. Sinuses/Orbits: No acute finding. Other: Mastoid air cells are clear. ASPECTS [facility] Stroke Program Early CT Score) - Ganglionic level infarction (caudate, lentiform nuclei, internal capsule, insula, M1-M3 cortex): 6 - Supraganglionic infarction (M4-M6 cortex): 3 Total score (0-10 with 10 being normal): 9 IMPRESSION: No acute intracranial hemorrhage.  No definite acute infarction. Possible dense left M2 MCA branch within the sylvian fissure. CTA pending. These results were called by telephone at the time of interpretation on 04/19/2020 at 1:42 pm to provider Lavonia Drafts , who verbally acknowledged these results. Electronically Signed: By: Macy Mis M.D. On: 04/19/2020 13:46   VAS [place] LOWER EXTREMITY VENOUS (DVT)  Result Date: 04/22/2020  Lower Venous DVT Study Indications: Stroke.  Comparison Study: No prior study Performing Technologist: Sharion Dove RVS  Examination Guidelines: A complete evaluation includes B-mode imaging, spectral Doppler, color Doppler, and power Doppler as needed of all accessible portions of each  vessel. Bilateral testing is considered an integral part of a complete examination. Limited examinations for reoccurring indications may be performed as noted. The reflux portion  of the exam is performed with the patient in reverse Trendelenburg.  +---------+---------------+---------+-----------+----------+--------------+ RIGHT    CompressibilityPhasicitySpontaneityPropertiesThrombus Aging +---------+---------------+---------+-----------+----------+--------------+ CFV      Full           Yes      Yes                                 +---------+---------------+---------+-----------+----------+--------------+ SFJ      Full                                                        +---------+---------------+---------+-----------+----------+--------------+ FV Prox  Full                                                        +---------+---------------+---------+-----------+----------+--------------+ FV Mid   Full                                                        +---------+---------------+---------+-----------+----------+--------------+ FV DistalFull                                                        +---------+---------------+---------+-----------+----------+--------------+ PFV      Full                                                        +---------+---------------+---------+-----------+----------+--------------+ POP      Full           Yes      Yes                                 +---------+---------------+---------+-----------+----------+--------------+ PTV      Full                                                        +---------+---------------+---------+-----------+----------+--------------+ PERO     Full                                                        +---------+---------------+---------+-----------+----------+--------------+   +---------+---------------+---------+-----------+----------+--------------+ LEFT     CompressibilityPhasicitySpontaneityPropertiesThrombus Aging +---------+---------------+---------+-----------+----------+--------------+  CFV      Full           Yes      Yes                                  +---------+---------------+---------+-----------+----------+--------------+ SFJ      Full                                                        +---------+---------------+---------+-----------+----------+--------------+ FV Prox  Full                                                        +---------+---------------+---------+-----------+----------+--------------+ FV Mid   Full                                                        +---------+---------------+---------+-----------+----------+--------------+ FV DistalFull                                                        +---------+---------------+---------+-----------+----------+--------------+ PFV      Full                                                        +---------+---------------+---------+-----------+----------+--------------+ POP      Full           Yes      Yes                                 +---------+---------------+---------+-----------+----------+--------------+ PTV      Full                                                        +---------+---------------+---------+-----------+----------+--------------+ PERO     Full                                                        +---------+---------------+---------+-----------+----------+--------------+     Summary: BILATERAL: - No evidence of deep vein thrombosis seen in the lower extremities, bilaterally. -No evidence of popliteal cyst, bilaterally.   *See table(s) above for measurements and observations. Electronically signed by Ruta Hinds MD on 04/22/2020 at 11:21:13 AM.    Final    US Abdomen Limited  RUQ (LIVER/GB)  Result Date: 04/20/2020 CLINICAL DATA:  Thrombocytopenia.  Inpatient. EXAM: ULTRASOUND ABDOMEN LIMITED RIGHT UPPER QUADRANT COMPARISON:  None. FINDINGS: Gallbladder: Numerous layering shadowing gallstones in the gallbladder, largest 1.4 cm. No gallbladder wall thickening. No pericholecystic fluid. No  sonographic Murphy's sign. Common bile duct: Diameter: 6 mm Liver: Top-normal liver parenchymal echogenicity. No liver masses. Portal vein is patent on color Doppler imaging with normal direction of blood flow towards the liver. Other: None. IMPRESSION: 1. Cholelithiasis.  No evidence of acute cholecystitis. 2. Top normal caliber common bile duct (6 mm diameter). Suggest correlation with serum bilirubin levels. MRI abdomen with MRCP may be considered as clinically warranted. 3. Normal liver. Electronically Signed   By: Ilona Sorrel M.D.   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 and she is in agreement to proceed I will see her back next week for further follow-up and repeat blood count  Acute CVA (cerebrovascular accident) ([place]) She had recurrent stroke She has no new focal neurological deficits She will continue antiplatelet agents  There is no contraindication to remain on antiplatelet agents or anticoagulants as long as the platelet is greater than 50,000.    Ovarian mass, left I have reviewed her CT imaging and gave the patient a copy of her ultrasound report I have arranged for her to see GYN surgeon next week for evaluation Ca1 25 is pending   No orders of the defined types were placed in this encounter.   All questions were answered. The patient knows to call the clinic with any problems, questions or concerns. The total time spent in the appointment was 30 minutes encounter with patients including review of chart and various tests results,  discussions about plan of care and coordination of care plan   [name], MD 05/02/2020 2:59 PM

## 2020-05-02 NOTE — Assessment & Plan Note (Signed)
I have reviewed her CT imaging and gave the patient a copy of her ultrasound report I have arranged for her to see GYN surgeon next week for evaluation Ca1 25 is pending

## 2020-05-02 NOTE — Assessment & Plan Note (Signed)
The most likely cause of her persistent thrombocytopenia is immune mediated thrombocytopenia purpura Explained to the patient the natural process of ITP She had extensive work-up recently and majority of treatable causes are ruled out I recommend a trial of prednisone therapy and she is in agreement We discussed the risk, benefits, side effects of treatment including insomnia, weight gain, fluid retention, etc. and she is in agreement to proceed I will see her back next week for further follow-up and repeat blood count

## 2020-05-02 NOTE — Assessment & Plan Note (Signed)
She had recurrent stroke She has no new focal neurological deficits She will continue antiplatelet agents  There is no contraindication to remain on antiplatelet agents or anticoagulants as long as the platelet is greater than 50,000.

## 2020-05-03 LAB — CA 125: Cancer Antigen (CA) 125: 592 U/mL — ABNORMAL HIGH (ref 0.0–38.1)

## 2020-05-04 ENCOUNTER — Other Ambulatory Visit: Payer: Self-pay

## 2020-05-04 ENCOUNTER — Ambulatory Visit (INDEPENDENT_AMBULATORY_CARE_PROVIDER_SITE_OTHER): Payer: Medicare Other | Admitting: Student

## 2020-05-04 DIAGNOSIS — I639 Cerebral infarction, unspecified: Secondary | ICD-10-CM

## 2020-05-04 LAB — CUP PACEART INCLINIC DEVICE CHECK
Date Time Interrogation Session: 20220401125005
Implantable Pulse Generator Implant Date: 20220321

## 2020-05-04 NOTE — Progress Notes (Signed)
ILR wound check in clinic. Steri strips removed. Wound well healed. Home monitor transmitting nightly. No episodes. Questions answered.  

## 2020-05-08 ENCOUNTER — Encounter: Payer: Self-pay | Admitting: Gynecologic Oncology

## 2020-05-08 DIAGNOSIS — I480 Paroxysmal atrial fibrillation: Secondary | ICD-10-CM | POA: Diagnosis not present

## 2020-05-09 ENCOUNTER — Ambulatory Visit: Payer: Medicare Other | Admitting: Family Medicine

## 2020-05-09 NOTE — Progress Notes (Signed)
Name: Natalie [name]   MRN: [id number]    DOB: 1950/07/28   Date:05/11/2020       Progress Note  Subjective  Chief Complaint  Follow up  HPI    MDD: she is under the care of the psychiatrist, she takes medications as prescribed. She has been under more stress with her health also worried about possibility of ovarian cancer. She is under the care of Dr. Thurmond Butts but since he is about the retire she asked if I can manage her medications. Advised her to ask Dr. Thurmond Butts to write me a letter with her current diagnosis and medications list explained that it is okay for me to monitor from now on.   Urge Incontinence: she drinks 8 glassed of water daily, she has urinary frequency and urgency for years. Not on therapy by choice  B12 deficiency:she is taking B12 supplementation   Hypothyroidism: she has been taking medication as prescribed, she states hair is not falling as much, she hasintermittentdry skin, no constipation . Last TSH was at goal   Dyslipidemia/history of CVA times two: she is now taking statin therapy, she had two strokes since January 21 and March 21. 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 Alvy Bimler and was advised to have pap smear with HPV 16/18 titers. Last pap smear was normal.   ITP: she is seeing hematologist, she is taking prednisone, titrating dose down and has follow up next week   Osteoporosis: on right femur, she is fair skin, family history of osteoporosis. She would like to hold off on all medications at this time.She has increased calcium in her diet.She has been taking vitamin D rx. We ordered bone density and mammogram but she did not have it done.   Morbid obesity: she has lost 30 lbs over the past 6 months, with new diagnosis of ovarian mass and high CA125. She states due to life style modification, but explained I would prefer if she could maintain her  weight and that the rapid drop is not very reassuring.   Patient Active Problem List   Diagnosis Date Noted  . Acute ITP ([place]) 05/02/2020  . Other intra-abdominal and pelvic swelling, mass and lump 04/26/2020  . Pelvic mass in female 04/25/2020  . Ovarian cancer screening 04/25/2020  . Ovarian mass, left 04/25/2020  . Stroke (cerebrum) ([place]) 04/20/2020  . Acute CVA (cerebrovascular accident) ([place]) 04/19/2020  . CVA (cerebral vascular accident) ([place]) 04/19/2020  . History of ischemic multifocal posterior circulation stroke 02/29/2020  . Thrombocytopenia ([place]) 02/28/2020  . HPV test positive 07/08/2015  . Difficulty hearing 06/28/2015  . Arthritis, degenerative 06/28/2015  . Obesity, Class I, BMI 30-34.9 06/28/2015  . Paresthesia 06/28/2015  . Purpura, nonthrombopenic ([place]) 06/28/2015  . Other specified hypothyroidism 08/09/2014  . Migraine without aura and without status migrainosus, not intractable 09/19/2008  . Moderate major depression ([place]) 09/07/2006  . OP (osteoporosis) 09/07/2006    Past Surgical History:  Procedure Laterality Date  . ABLATION  2006  . BUBBLE STUDY  04/23/2020   Procedure: BUBBLE STUDY;  Surgeon: Larey Dresser, MD;  Location: [facility] ENDOSCOPY;  Service: Cardiovascular;;  . CATARACT EXTRACTION W/ INTRAOCULAR LENS IMPLANT Bilateral   . COLONOSCOPY    . COLONOSCOPY WITH PROPOFOL N/A 12/22/2018   Procedure: COLONOSCOPY WITH PROPOFOL;  Surgeon: Virgel Manifold, MD;  Location: [facility] ENDOSCOPY;  Service: Endoscopy;  Laterality: N/A;  . EYE SURGERY  Spring 2018   Cataracts both eyes  . LOOP RECORDER INSERTION N/A 04/23/2020   Procedure: LOOP RECORDER INSERTION;  Surgeon: Evans Lance, MD;  Location: [place] CV LAB;  Service: Cardiovascular;  Laterality: N/A;  . TEE WITHOUT CARDIOVERSION N/A 04/23/2020   Procedure: TRANSESOPHAGEAL ECHOCARDIOGRAM (TEE);  Surgeon: Larey Dresser, MD;  Location: [facility] ENDOSCOPY;  Service: Cardiovascular;  Laterality: N/A;  .  TONSILLECTOMY      Family History  Problem Relation Age of Onset  . Early death Father        suicide  . Depression Father   . Diabetes Father   . Cancer Father        prostate  . Hearing loss Father        due to war  . Alzheimer's disease Mother   . Heart disease Brother   . Atrial fibrillation Brother   . Heart disease Maternal Aunt   . Dementia Maternal Aunt   . Heart disease Maternal Uncle   . Dementia Maternal Grandmother   . Heart disease Brother   . Atrial fibrillation Brother   . Colon cancer Neg Hx   . Breast cancer Neg Hx   . Ovarian cancer Neg Hx   . Pancreatic cancer Neg Hx   . Endometrial cancer Neg Hx     Social History   Tobacco Use  . Smoking status: Former Smoker    Packs/day: 0.50    Years: 6.00    Pack years: 3.00    Types: Cigarettes    Quit date: 02/11/1974    Years since quitting: 46.2  . Smokeless tobacco: Never Used  Substance Use Topics  . Alcohol use: Yes    Alcohol/week: 0.0 standard drinks    Comment: 2-3 drinks monthly     Current Outpatient Medications:  .  aspirin EC 81 MG tablet, Take 1 tablet (81 mg total) by mouth daily for 21 days., Disp: 21 tablet, Rfl: 0 .  atorvastatin (LIPITOR) 40 MG tablet, Take 1 tablet (40 mg total) by mouth daily., Disp: 90 tablet, Rfl: 1 .  buPROPion (WELLBUTRIN SR) 150 MG 12 hr tablet, Take 150 mg by mouth 2 (two) times daily., Disp: , Rfl:  .  citalopram (CELEXA) 40 MG tablet, Take 40 mg by mouth daily., Disp: , Rfl:  .  clindamycin-benzoyl peroxide (BENZACLIN) gel, Apply topically 2 (two) times daily. (Patient taking differently: Apply 1 application topically 2 (two) times daily as needed (facial acne).), Disp: 50 g, Rfl: 0 .  clopidogrel (PLAVIX) 75 MG tablet, Take 1 tablet (75 mg total) by mouth daily., Disp: 30 tablet, Rfl: 1 .  Cyanocobalamin (VITAMIN B-12) 1000 MCG SUBL, Place 1,000 mcg under the tongue at bedtime., Disp: , Rfl:  .  levothyroxine (SYNTHROID) 75 MCG tablet, Take 1 tablet (75 mcg  total) by mouth daily before breakfast., Disp: 90 tablet, Rfl: 1 .  Multiple Vitamin (MULTIVITAMIN WITH MINERALS) TABS tablet, Take 1 tablet by mouth daily., Disp: , Rfl:  .  Omega-3 Fatty Acids (FISH OIL) 1000 MG CAPS, Take 1 capsule by mouth daily., Disp: , Rfl:  .  polyvinyl alcohol (LIQUIFILM TEARS) 1.4 % ophthalmic solution, Place 1 drop into both eyes daily as needed for dry eyes., Disp: , Rfl:  .  predniSONE (DELTASONE) 20 MG tablet, Starting 4/7, 40 mg daily prednisone for 4 days, then reduce to 20 mg daily, Disp: 90 tablet, Rfl: 1 .  topiramate (TOPAMAX) 25 MG tablet, Take  25 mg by mouth daily. , Disp: , Rfl:  .  Vitamin D, Ergocalciferol, (DRISDOL) 1.25 MG (50000 UNIT) CAPS capsule, TAKE 1 CAPSULE BY MOUTH EVERY 7 DAYS (Patient taking differently: Take 50,000 Units by mouth every Tuesday.), Disp: 12 capsule, Rfl: 1  Allergies  Allergen Reactions  . Sulfamethoxazole-Trimethoprim Hives    I personally reviewed active problem list, medication list, allergies, family history, social history, health maintenance with the patient/caregiver today.   ROS  Constitutional: Negative for fever , positive for mild weight change.  Respiratory: Negative for cough and shortness of breath.   Cardiovascular: Negative for chest pain or palpitations.  Gastrointestinal: Negative for abdominal pain, no bowel changes.  Musculoskeletal: positive  for gait problem/balance issues and some leg weakness since stroke, but no  joint swelling.  Skin: Negative for rash.  Neurological: positive  Intermittent  dizziness and  headache.  No other specific complaints in a complete review of systems (except as listed in HPI above).  Objective  Vitals:   05/11/20 1023  BP: 132/82  Pulse: 89  Resp: 16  Temp: 97.8 F (36.6 C)  TempSrc: Oral  SpO2: 96%  Weight: 203 lb (92.1 kg)  Height: 5\' 6"  (1.676 m)    Body mass index is 32.77 kg/m.  Physical Exam  Constitutional: Patient appears well-developed and  well-nourished. Obese  No distress.  HEENT: head atraumatic, normocephalic, pupils equal and reactive to light, neck supple Cardiovascular: Normal rate, regular rhythm and normal heart sounds.  No murmur heard. No BLE edema. Pulmonary/Chest: Effort normal and breath sounds normal. No respiratory distress. Abdominal: Soft.  There is no tenderness. Pelvic: vaginal atrophy, friable cervix, bimanual exam uterus enlarged and lumpy , no pain  Neurological: no focal deficits  Psychiatric: Patient has a normal mood and affect. behavior is normal. Judgment and thought content normal.   PHQ2/9: Depression screen [facility] 2/9 05/11/2020 03/08/2020 01/25/2020 12/20/2019 11/09/2019  Decreased Interest 0 0 0 1 0  Down, Depressed, Hopeless 0 0 0 1 0  PHQ - 2 Score 0 0 0 2 0  Altered sleeping 0 1 0 0 -  Tired, decreased energy 1 1 1  0 -  Change in appetite 0 0 1 0 -  Feeling bad or failure about yourself  0 0 0 0 -  Trouble concentrating 1 0 0 0 -  Moving slowly or fidgety/restless 0 0 0 1 -  Suicidal thoughts 0 0 0 0 -  PHQ-9 Score 2 2 2 3  -  Difficult doing work/chores - - - Not difficult at all -  Some recent data might be hidden    phq 9 is negative    Fall Risk: Fall Risk  05/11/2020 03/08/2020 01/25/2020 12/20/2019 11/09/2019  Falls in the past year? 0 0 1 1 1   Number falls in past yr: 0 0 0 1 1  Injury with Fall? 0 0 0 0 0  Comment - - - - -  Risk for fall due to : - - - History of fall(s) -  Risk for fall due to: Comment - - - - -  Follow up - - - Falls prevention discussed -     Functional Status Survey: Is the patient deaf or have difficulty hearing?: Yes Does the patient have difficulty seeing, even when wearing glasses/contacts?: No Does the patient have difficulty concentrating, remembering, or making decisions?: Yes Does the patient have difficulty walking or climbing stairs?: Yes Does the patient have difficulty dressing or bathing?: No Does the patient  have difficulty doing errands  alone such as visiting a doctor's office or shopping?: No    Assessment & Plan  1. Recurrent cerebrovascular accidents (CVAs) ([place])  - Ambulatory referral to Cardiology  2. Major depression, recurrent, chronic ([place])  Under the care of psychiatrist   3. Adult hypothyroidism  - levothyroxine (SYNTHROID) 75 MCG tablet; Take 1 tablet (75 mcg total) by mouth daily before breakfast.  Dispense: 90 tablet; Refill: 1  4. Dyslipidemia  Continue statin therapy   5. Idiopathic thrombocytopenic purpura (ITP) ([facility])  Under the care of hematologist and is on prednisone   6. B12 deficiency   7. Hyperglycemia   8. Cervical high risk human papillomavirus (HPV) DNA test positive  - Cytology - PAP  9. Morbid obesity ([place])  Advised to try to maintain weight since she has lost 30 lbs in the past 6 months.    10. Age-related osteoporosis without current pathological fracture

## 2020-05-10 ENCOUNTER — Inpatient Hospital Stay: Payer: Medicare Other | Attending: Hematology and Oncology | Admitting: Gynecologic Oncology

## 2020-05-10 ENCOUNTER — Encounter: Payer: Self-pay | Admitting: Gynecologic Oncology

## 2020-05-10 ENCOUNTER — Other Ambulatory Visit: Payer: Self-pay

## 2020-05-10 ENCOUNTER — Encounter: Payer: Self-pay | Admitting: Hematology and Oncology

## 2020-05-10 ENCOUNTER — Telehealth: Payer: Self-pay | Admitting: Hematology and Oncology

## 2020-05-10 ENCOUNTER — Inpatient Hospital Stay (HOSPITAL_BASED_OUTPATIENT_CLINIC_OR_DEPARTMENT_OTHER): Payer: Medicare Other | Admitting: Hematology and Oncology

## 2020-05-10 ENCOUNTER — Inpatient Hospital Stay: Payer: Medicare Other

## 2020-05-10 VITALS — BP 138/93 | HR 76 | Temp 97.3°F | Resp 20 | Wt 202.6 lb

## 2020-05-10 DIAGNOSIS — Z7901 Long term (current) use of anticoagulants: Secondary | ICD-10-CM | POA: Diagnosis not present

## 2020-05-10 DIAGNOSIS — D696 Thrombocytopenia, unspecified: Secondary | ICD-10-CM | POA: Insufficient documentation

## 2020-05-10 DIAGNOSIS — N838 Other noninflammatory disorders of ovary, fallopian tube and broad ligament: Secondary | ICD-10-CM

## 2020-05-10 DIAGNOSIS — Z8673 Personal history of transient ischemic attack (TIA), and cerebral infarction without residual deficits: Secondary | ICD-10-CM | POA: Insufficient documentation

## 2020-05-10 DIAGNOSIS — Z7902 Long term (current) use of antithrombotics/antiplatelets: Secondary | ICD-10-CM | POA: Insufficient documentation

## 2020-05-10 DIAGNOSIS — Z1273 Encounter for screening for malignant neoplasm of ovary: Secondary | ICD-10-CM

## 2020-05-10 DIAGNOSIS — I129 Hypertensive chronic kidney disease with stage 1 through stage 4 chronic kidney disease, or unspecified chronic kidney disease: Secondary | ICD-10-CM | POA: Diagnosis not present

## 2020-05-10 DIAGNOSIS — E039 Hypothyroidism, unspecified: Secondary | ICD-10-CM | POA: Insufficient documentation

## 2020-05-10 DIAGNOSIS — Z780 Asymptomatic menopausal state: Secondary | ICD-10-CM | POA: Diagnosis not present

## 2020-05-10 DIAGNOSIS — D398 Neoplasm of uncertain behavior of other specified female genital organs: Secondary | ICD-10-CM | POA: Diagnosis not present

## 2020-05-10 DIAGNOSIS — E785 Hyperlipidemia, unspecified: Secondary | ICD-10-CM | POA: Diagnosis not present

## 2020-05-10 DIAGNOSIS — N9489 Other specified conditions associated with female genital organs and menstrual cycle: Secondary | ICD-10-CM

## 2020-05-10 DIAGNOSIS — A630 Anogenital (venereal) warts: Secondary | ICD-10-CM | POA: Insufficient documentation

## 2020-05-10 DIAGNOSIS — I639 Cerebral infarction, unspecified: Secondary | ICD-10-CM

## 2020-05-10 DIAGNOSIS — R971 Elevated cancer antigen 125 [CA 125]: Secondary | ICD-10-CM | POA: Insufficient documentation

## 2020-05-10 DIAGNOSIS — R1900 Intra-abdominal and pelvic swelling, mass and lump, unspecified site: Secondary | ICD-10-CM

## 2020-05-10 DIAGNOSIS — F32A Depression, unspecified: Secondary | ICD-10-CM | POA: Diagnosis not present

## 2020-05-10 DIAGNOSIS — R6881 Early satiety: Secondary | ICD-10-CM | POA: Insufficient documentation

## 2020-05-10 DIAGNOSIS — M1990 Unspecified osteoarthritis, unspecified site: Secondary | ICD-10-CM | POA: Insufficient documentation

## 2020-05-10 LAB — CBC WITH DIFFERENTIAL/PLATELET
Abs Immature Granulocytes: 0.04 10*3/uL (ref 0.00–0.07)
Basophils Absolute: 0 10*3/uL (ref 0.0–0.1)
Basophils Relative: 0 %
Eosinophils Absolute: 0 10*3/uL (ref 0.0–0.5)
Eosinophils Relative: 0 %
HCT: 40 % (ref 36.0–46.0)
Hemoglobin: 13.6 g/dL (ref 12.0–15.0)
Immature Granulocytes: 0 %
Lymphocytes Relative: 28 %
Lymphs Abs: 2.8 10*3/uL (ref 0.7–4.0)
MCH: 31.5 pg (ref 26.0–34.0)
MCHC: 34 g/dL (ref 30.0–36.0)
MCV: 92.6 fL (ref 80.0–100.0)
Monocytes Absolute: 0.6 10*3/uL (ref 0.1–1.0)
Monocytes Relative: 6 %
Neutro Abs: 6.6 10*3/uL (ref 1.7–7.7)
Neutrophils Relative %: 66 %
Platelets: 143 10*3/uL — ABNORMAL LOW (ref 150–400)
RBC: 4.32 MIL/uL (ref 3.87–5.11)
RDW: 12.2 % (ref 11.5–15.5)
WBC: 10.1 10*3/uL (ref 4.0–10.5)
nRBC: 0 % (ref 0.0–0.2)

## 2020-05-10 MED ORDER — PREDNISONE 20 MG PO TABS: ORAL_TABLET | ORAL | 1 refills | Status: DC

## 2020-05-10 NOTE — Progress Notes (Signed)
GYNECOLOGIC ONCOLOGY NEW PATIENT CONSULTATION   Patient Name: Natalie [name]  Patient Age: 70 y.o. Date of Service: 05/10/20 Referring Provider: [name] MD  Primary Care Provider: [name], MD Consulting Provider: Jeral Pinch, MD   Assessment/Plan:  Postmenopausal patient with a complex adnexal mass in the setting of 2 recent CVA events.  I reviewed findings from the patient's CT scan and ultrasound.  She has an approximately 5 cm left adnexal mass that on ultrasound does not have significant features that raise the concern for malignancy.  In terms of its size, while it could be contributing to some of her urinary symptoms, my guess is that these are unrelated.  We discussed the CA-125, which was elevated at almost 600.  I discussed the issue with using this as a screening test (there is currently no screening for ovarian cancer) because it is neither specific nor sensitive.  We discussed that there are many benign disease processes, such as uterine fibroids or recent infection/inflammation, that can cause elevation of this tumor marker.  Additionally, and up to 50% of early stage ovarian cancer, CA-125 is within normal limits.  In terms of the etiology of her recurrent cerebrovascular accidents, I certainly think it is reasonable to have malignancy on the differential.  Given the information we have, I think a GYN malignancy is unlikely.  I discussed with the patient and her daughter that the imaging test that could help more definitively rule out malignancy would be a PET scan.  At this time, given ultrasound findings, I do not feel strongly that this is indicated.  At present, in the setting of her recent strokes, I think that surgery carries a much higher risk than the risk that she has an ovarian cancer poses.  My recommendation is that we get close follow-up with an ultrasound in 3 months, to assess for any change in size or character of the mass.  If there has been increase  in size or other more worrisome features of the mass, then surgery may be indicated and I will work with her cardiologist and neurologist to help outline a plan for her Plavix.  It would also be very reasonable to repeat a CA-125 in several weeks.  If this comes down from its previous level, then this is likely a reflection of her other comorbidities and disease processes.  In terms of her persistent HPV infection, I discussed with the patient that on her next Pap test, I would recommend that subtyping be obtained.  If she continues to have persistent HPV infection, I think colposcopy would be reasonable.  In postmenopausal women, there reports that cytology can be less sensitive and so I think persistent infection warrants closer inspection and biopsies.  If no cervical dysplasia is found, then I would recommend considering a trial of vaginal estrogen.  A copy of this note was sent to the patient's referring provider.   70 minutes of total time was spent for this patient encounter, including preparation, face-to-face counseling with the patient and coordination of care, and documentation of the encounter.  Jeral Pinch, MD  Division of Gynecologic Oncology  Department of Obstetrics and Gynecology  [facility]  ___________________________________________  Chief Complaint: Chief Complaint  Patient presents with  . Adnexal mass    History of Present Illness:  Natalie [name] is a 70 y.o. y.o. female who is seen in consultation at the request of Dr. Alvy Bimler for an evaluation of an adnexal mass.  The  patient's recent history is significant for a stroke in January of this year after presenting with a month of intermittent and progressive diplopia.  MRI at that time showed bilateral posterior ischemic changes and the patient was treated with Plavix and aspirin for an acute ischemic stroke.  She had recurrence of symptoms in mid March and then presented with  difficulty speaking and mentating.  She was diagnosed with an acute left MCA infarct and nearly occlusive thrombus.  She was continued on aspirin and Plavix.  Work-up during her most recent hospitalization included negative Dopplers of her lower extremities and a echo showing moderate left atrial enlargement.  She had a loop recorder placed as well.  During that hospitalization, she was noted to have thrombocytopenia which is presumed to be secondary to ITP and the patient was started on steroids.  During more recent hospitalization for stroke, patient underwent CT of the abdomen and pelvis showing an adnexal mass.  Pelvic ultrasound was recommended for follow-up.  Patient had a CA-125 that was elevated at almost 600.  She reports feeling well since being discharged from the hospital.  She has minimal deficits although still feels somewhat unsteady on her legs and continues to have some difficulty with intermittent word finding and slurring.  She notes that her appetite is up and down.  More recently, she has not felt very hungry.  She cut out sugar from her diet back in October and lost some weight after that change.  She snacks frequently and does not often eat large meals.  She calls herself a compulsive eater.  She notes some early satiety but then will get hungry shortly after.  She denies any abdominal bloating.  She notes diarrhea almost all the time now since her hospitalizations.  She will occasionally have soft formed stools, but more often her stool is liquid.  She describes about 2 years of urinary frequency as well as some urinary urgency and incontinence.  She has tried several medications for the symptoms which did not work.  She has occasional pressure when she needs to urinate but otherwise denies pelvic pain or pressure.  She denies any vaginal bleeding or discharge since menopause.  Her past medical history is notable for migraines, depression, thyroid disease, hypertension, and  hyperlipidemia.  She has never had abdominal surgery.  She has a strong family history of Alzheimer's and dementia.  She denies any family history of GYN or breast cancer on her mom side.  She does not know many details about her father side of the family.  She lives in [place] by herself although her daughter, Natalie [name], who is getting her masters at [employer] is frequently with her.  She reports occasional alcohol use.  She is retired.  PAST MEDICAL HISTORY:  Past Medical History:  Diagnosis Date  . Acute bronchospasm due to viral infection   . Anemia    history of  . CVA (cerebral vascular accident) ([place]) 02/28/2020  . Depressive disorder   . Epistaxis   . Fatigue   . Hearing loss   . Hypothyroidism   . Memory change   . Migraine   . Numbness and tingling   . Obesity (BMI 30.0-34.9)   . Osteoarthritis   . Osteoporosis   . Polyuria   . Premature menopause   . Seizures ([place])    history of mini seizures, possible migraine induced     PAST SURGICAL HISTORY:  Past Surgical History:  Procedure Laterality Date  . ABLATION  2006  .  BUBBLE STUDY  04/23/2020   Procedure: BUBBLE STUDY;  Surgeon: Larey Dresser, MD;  Location: [facility] ENDOSCOPY;  Service: Cardiovascular;;  . CATARACT EXTRACTION W/ INTRAOCULAR LENS IMPLANT Bilateral   . COLONOSCOPY    . COLONOSCOPY WITH PROPOFOL N/A 12/22/2018   Procedure: COLONOSCOPY WITH PROPOFOL;  Surgeon: Virgel Manifold, MD;  Location: [facility] ENDOSCOPY;  Service: Endoscopy;  Laterality: N/A;  . EYE SURGERY  Spring 2018   Cataracts both eyes  . LOOP RECORDER INSERTION N/A 04/23/2020   Procedure: LOOP RECORDER INSERTION;  Surgeon: Evans Lance, MD;  Location: [facility] CV LAB;  Service: Cardiovascular;  Laterality: N/A;  . TEE WITHOUT CARDIOVERSION N/A 04/23/2020   Procedure: TRANSESOPHAGEAL ECHOCARDIOGRAM (TEE);  Surgeon: Larey Dresser, MD;  Location: [facility] ENDOSCOPY;  Service: Cardiovascular;  Laterality: N/A;  . TONSILLECTOMY      OB/GYN  HISTORY:  OB History  Gravida Para Term Preterm AB Living  1 0          SAB IAB Ectopic Multiple Live Births               # Outcome Date GA Lbr Len/2nd Weight Sex Delivery Anes PTL Lv  1 Gravida             No LMP recorded. Patient is postmenopausal.  Age at menarche: 45 Age at menopause: 21 Hx of HRT: Denies Hx of STDs: Denies Last pap: 2021, HPV positive, pap negative  (in 2017 was HR HPV positive, HPV 16/18 negative) History of abnormal pap smears: yes  SCREENING STUDIES:  Last mammogram: 2017  Last colonoscopy: 2020 Last bone mineral density: 2017  MEDICATIONS: Outpatient Encounter Medications as of 05/10/2020  Medication Sig  . aspirin EC 81 MG tablet Take 1 tablet (81 mg total) by mouth daily for 21 days.  Marland Kitchen atorvastatin (LIPITOR) 40 MG tablet Take 1 tablet (40 mg total) by mouth daily.  Marland Kitchen buPROPion (WELLBUTRIN SR) 150 MG 12 hr tablet Take 150 mg by mouth 2 (two) times daily.  . citalopram (CELEXA) 40 MG tablet Take 40 mg by mouth daily.  . clindamycin-benzoyl peroxide (BENZACLIN) gel Apply topically 2 (two) times daily. (Patient taking differently: Apply 1 application topically 2 (two) times daily as needed (facial acne).)  . clopidogrel (PLAVIX) 75 MG tablet Take 1 tablet (75 mg total) by mouth daily.  . Cyanocobalamin (VITAMIN B-12) 1000 MCG SUBL Place 1,000 mcg under the tongue at bedtime.  Marland Kitchen levothyroxine (SYNTHROID) 75 MCG tablet Take 1 tablet (75 mcg total) by mouth daily before breakfast.  . Multiple Vitamin (MULTIVITAMIN WITH MINERALS) TABS tablet Take 1 tablet by mouth daily.  . Omega-3 Fatty Acids (FISH OIL) 1000 MG CAPS Take 1 capsule by mouth daily.  . polyvinyl alcohol (LIQUIFILM TEARS) 1.4 % ophthalmic solution Place 1 drop into both eyes daily as needed for dry eyes.  Marland Kitchen topiramate (TOPAMAX) 25 MG tablet Take 25 mg by mouth daily.   . Vitamin D, Ergocalciferol, (DRISDOL) 1.25 MG (50000 UNIT) CAPS capsule TAKE 1 CAPSULE BY MOUTH EVERY 7 DAYS (Patient taking  differently: Take 50,000 Units by mouth every Tuesday.)  . [DISCONTINUED] predniSONE (DELTASONE) 20 MG tablet Take 3 tablets (60 mg total) by mouth daily with breakfast.   No facility-administered encounter medications on file as of 05/10/2020.    ALLERGIES:  Allergies  Allergen Reactions  . Sulfamethoxazole-Trimethoprim Hives     FAMILY HISTORY:  Family History  Problem Relation Age of Onset  . Early death Father  suicide  . Depression Father   . Diabetes Father   . Cancer Father        prostate  . Hearing loss Father        due to war  . Alzheimer's disease Mother   . Heart disease Brother   . Atrial fibrillation Brother   . Heart disease Maternal Aunt   . Dementia Maternal Aunt   . Heart disease Maternal Uncle   . Dementia Maternal Grandmother   . Heart disease Brother   . Atrial fibrillation Brother   . Colon cancer Neg Hx   . Breast cancer Neg Hx   . Ovarian cancer Neg Hx   . Pancreatic cancer Neg Hx   . Endometrial cancer Neg Hx      SOCIAL HISTORY:  Social Connections: Moderately Integrated  . Frequency of Communication with Friends and Family: More than three times a week  . Frequency of Social Gatherings with Friends and Family: Twice a week  . Attends Religious Services: More than 4 times per year  . Active Member of Clubs or Organizations: Yes  . Attends Archivist Meetings: More than 4 times per year  . Marital Status: Never married    REVIEW OF SYSTEMS:  Pertinent positives include appetite changes, fatigue, ringing in ears, diarrhea, difficulty walking, dizziness, bruising/bleeding, anxiety. Denies fevers, chills, unexplained weight changes. Denies hearing loss, neck lumps or masses, mouth sores, or voice changes. Denies cough or wheezing.  Denies shortness of breath. Denies chest pain or palpitations. Denies leg swelling. Denies abdominal distention, pain, blood in stools, constipation, nausea, vomiting, or early satiety. Denies  pain with intercourse, dysuria, frequency, hematuria or incontinence. Denies hot flashes, pelvic pain, vaginal bleeding or vaginal discharge.   Denies joint pain, back pain or muscle pain/cramps. Denies itching, rash, or wounds. Denies headaches, numbness or seizures. Denies swollen lymph nodes or glands. Denies confusion or decreased concentration.  Physical Exam:  Vital Signs for this encounter:  Blood pressure (!) 138/93, pulse 76, temperature (!) 97.3 F (36.3 C), temperature source Tympanic, resp. rate 20, weight 202 lb 9.6 oz (91.9 kg), SpO2 97 %. Body mass index is 32.7 kg/m. General: Alert, oriented, no acute distress.  HEENT: Normocephalic, atraumatic. Sclera anicteric.  Chest: Clear to auscultation bilaterally. No wheezes, rhonchi, or rales. Cardiovascular: Regular rate and rhythm, no murmurs, rubs, or gallops.  Abdomen: Obese. Normoactive bowel sounds. Soft, nondistended, nontender to palpation. No masses or hepatosplenomegaly appreciated. No palpable fluid wave.  Extremities: Grossly normal range of motion. Warm, well perfused. No edema bilaterally.  Skin: No rashes or lesions.  Lymphatics: No cervical, supraclavicular, or inguinal adenopathy.  GU:  Normal external female genitalia. No lesions. No discharge or bleeding.             Bladder/urethra:  No lesions or masses.             Vagina: Mildly atrophic, no lesions or masses.             Cervix: Normal appearing, no lesions.             Uterus: Small, mobile, no parametrial involvement or nodularity.             Adnexa: Some fullness appreciated in the cul-de-sac in the midline.  Rectal: No nodularity, mobile mass within the cul-de-sac.  LABORATORY AND RADIOLOGIC DATA:  Outside medical records were reviewed to synthesize the above history, along with the history and physical obtained during the visit.   Lab Results  Component Value Date   WBC 10.1 05/10/2020   HGB 13.6 05/10/2020   HCT 40.0 05/10/2020   PLT 143  (L) 05/10/2020   GLUCOSE 101 (H) 04/24/2020   CHOL 87 04/20/2020   TRIG 54 04/20/2020   HDL 31 (L) 04/20/2020   LDLCALC 45 04/20/2020   ALT 27 04/24/2020   AST 34 04/24/2020   NA 137 04/24/2020   K 3.4 (L) 04/24/2020   CL 105 04/24/2020   CREATININE 0.90 04/24/2020   BUN 16 04/24/2020   CO2 25 04/24/2020   TSH 2.79 01/25/2020   INR 1.1 04/21/2020   HGBA1C 5.0 04/20/2020   CT A/P on 04/23/20: Cholelithiasis. No evidence of cholecystitis crash that no evidence of acute cholecystitis. Normal spleen size and appearance. Enlarged fibroid uterus. Complex septated cystic mass in the left ovary measuring up to 5 cm. This could be further evaluated with pelvic ultrasound.  Pelvic ultrasound on 05/01/20: 1. Enlarged lobulated uterus with multiple fibroids. 2. 5.8 cm dominant septated cyst in the left adnexa. Surgical consultation is recommended.   Ref Range & Units 8 d ago  Cancer Antigen (CA) 125 0.0 - 38.1 U/mL 592.0High

## 2020-05-10 NOTE — Assessment & Plan Note (Signed)
The cause of her recurrent stroke is unknown but appears to be embolic in nature She will continue antiplatelet therapy She is doing well with outpatient therapy and rehab

## 2020-05-10 NOTE — Progress Notes (Signed)
[place] OFFICE PROGRESS NOTE  [name], MD  ASSESSMENT & PLAN:  Thrombocytopenia ([place]) Her thrombocytopenia is most consistent with ITP She has excellent response to 60 mg of prednisone for the past week I recommend prednisone taper Starting today, she will take prednisone 40 mg daily for 4 days and then starting next week, reduce to 20 mg daily until her next appointment in a week So far, she has no bleeding complications  Ovarian mass, left She has extensive evaluation for pelvic mass She has seen GYN surgeon I agree with recommendation to observe for another 3 months  Acute CVA (cerebrovascular accident) [facility]) The cause of her recurrent stroke is unknown but appears to be embolic in nature She will continue antiplatelet therapy She is doing well with outpatient therapy and rehab   No orders of the defined types were placed in this encounter.   The total time spent in the appointment was 20 minutes encounter with patients including review of chart and various tests results, discussions about plan of care and coordination of care plan   All questions were answered. The patient knows to call the clinic with any problems, questions or concerns. No barriers to learning was detected.    [name], MD 4/7/202211:12 AM  INTERVAL HISTORY: [name] 70 y.o. female returns for further follow-up with her daughter She is doing well She tolerated prednisone well without side effects The patient denies any recent signs or symptoms of bleeding such as spontaneous epistaxis, hematuria or hematochezia. Denies worsening signs or symptoms of stroke  SUMMARY OF HEMATOLOGIC HISTORY: ATLANTA PELTO 70 y.o. female is seen in the clinic today as a hospital follow-up Please see my consult note dated April 23, 2020 for further details  She presented with difficulty speaking and diplopia in this hospital stay.  She was also having difficulty thinking.  A  code stroke was called.  CT head showed no acute intracranial hemorrhage and no definite acute infarction.  CTA head and neck showed nearly occlusive thrombus at the bifurcation of a proximal M2 MCA branch approximately 6 mm from the MCA bifurcation, subsequent occlusion of a proximal left M3 MCA branch, no stenosis in the neck.  MRI brain without contrast showed patchy small volume acute ischemic nonhemorrhagic posterior left MCA distribution infarcts likely reflecting embolic shower.   She was noted to have thrombocytopenia on admission with a platelet count of 93,000. Platelet count slowly drifting down this admission 93>77>68>77>66>59.  The most recent CBC available to me performed prior to this admission was done on 02/28/2020 and at that time, her platelet count was 108,000.  The patient is receiving several antiplatelet and anticoagulant medications including aspirin 81 mg daily, Plavix 75 mg daily, Lovenox 40 mg daily.  Review of her chart show that the aspirin and Plavix were started at the time of her initial CVA in January 2022.  She tells me that she was off the Plavix for short period time but recently restarted on it just prior to this admission.  The patient underwent TEE earlier today.  Report reviewed and there was no source of embolus but moderate LAE is suggestive of possible PAF.  Feeling better overall.  She is not having any diplopia today.  Her speech is improving but she still has some slurred speech and difficulty with word finding.  She currently denies bleeding.  She is not having any fevers, chills, headaches, chest pain, shortness of breath, abdominal pain, nausea, vomiting. Hematology was  asked to see the patient make recommendations regarding her thrombocytopenia. In the hospital, she had extensive evaluation Serum vitamin V67 and folic acid level were normal CT imaging of abdomen and pelvis were performed to rule out splenomegaly and that revealed pelvic mass The patient did  not receive platelet transfusion while hospitalized She was discharged home on medical management  Since she left the hospital, she feels fine The patient denies any recent signs or symptoms of bleeding such as spontaneous epistaxis, hematuria or hematochezia. On 05/03/2020, she was started on daily prednisone 60 mg for ITP On 05/10/2020, the dose of prednisone is reduced to 40 mg taper course  I have reviewed the past medical history, past surgical history, social history and family history with the patient and they are unchanged from previous note.  ALLERGIES:  is allergic to sulfamethoxazole-trimethoprim.  MEDICATIONS:  Current Outpatient Medications  Medication Sig Dispense Refill  . aspirin EC 81 MG tablet Take 1 tablet (81 mg total) by mouth daily for 21 days. 21 tablet 0  . atorvastatin (LIPITOR) 40 MG tablet Take 1 tablet (40 mg total) by mouth daily. 90 tablet 1  . buPROPion (WELLBUTRIN SR) 150 MG 12 hr tablet Take 150 mg by mouth 2 (two) times daily.    . citalopram (CELEXA) 40 MG tablet Take 40 mg by mouth daily.    . clindamycin-benzoyl peroxide (BENZACLIN) gel Apply topically 2 (two) times daily. (Patient taking differently: Apply 1 application topically 2 (two) times daily as needed (facial acne).) 50 g 0  . clopidogrel (PLAVIX) 75 MG tablet Take 1 tablet (75 mg total) by mouth daily. 30 tablet 1  . Cyanocobalamin (VITAMIN B-12) 1000 MCG SUBL Place 1,000 mcg under the tongue at bedtime.    Marland Kitchen levothyroxine (SYNTHROID) 75 MCG tablet Take 1 tablet (75 mcg total) by mouth daily before breakfast. 90 tablet 1  . Multiple Vitamin (MULTIVITAMIN WITH MINERALS) TABS tablet Take 1 tablet by mouth daily.    . Omega-3 Fatty Acids (FISH OIL) 1000 MG CAPS Take 1 capsule by mouth daily.    . polyvinyl alcohol (LIQUIFILM TEARS) 1.4 % ophthalmic solution Place 1 drop into both eyes daily as needed for dry eyes.    . predniSONE (DELTASONE) 20 MG tablet Starting 4/7, 40 mg daily prednisone for 4  days, then reduce to 20 mg daily 90 tablet 1  . topiramate (TOPAMAX) 25 MG tablet Take 25 mg by mouth daily.     . Vitamin D, Ergocalciferol, (DRISDOL) 1.25 MG (50000 UNIT) CAPS capsule TAKE 1 CAPSULE BY MOUTH EVERY 7 DAYS (Patient taking differently: Take 50,000 Units by mouth every Tuesday.) 12 capsule 1   No current facility-administered medications for this visit.     REVIEW OF SYSTEMS:   Constitutional: Denies fevers, chills or night sweats Eyes: Denies blurriness of vision Ears, nose, mouth, throat, and face: Denies mucositis or sore throat Respiratory: Denies cough, dyspnea or wheezes Cardiovascular: Denies palpitation, chest discomfort or lower extremity swelling Gastrointestinal:  Denies nausea, heartburn or change in bowel habits Skin: Denies abnormal skin rashes Lymphatics: Denies new lymphadenopathy or easy bruising Neurological:Denies numbness, tingling or new weaknesses Behavioral/Psych: Mood is stable, no new changes  All other systems were reviewed with the patient and are negative.  PHYSICAL EXAMINATION: ECOG PERFORMANCE STATUS: 1 - Symptomatic but completely ambulatory  Vitals:   05/10/20 1105  BP: (!) 119/50   There were no vitals filed for this visit.  GENERAL:alert, no distress and comfortable NEURO: alert & oriented x  3 with fluent speech  LABORATORY DATA:  I have reviewed the data as listed     Component Value Date/Time   NA 137 04/24/2020 0412   NA 140 06/28/2015 1600   K 3.4 (L) 04/24/2020 0412   CL 105 04/24/2020 0412   CO2 25 04/24/2020 0412   GLUCOSE 101 (H) 04/24/2020 0412   BUN 16 04/24/2020 0412   BUN 12 06/28/2015 1600   CREATININE 0.90 04/24/2020 0412   CREATININE 0.94 01/25/2020 0820   CALCIUM 8.9 04/24/2020 0412   PROT 5.8 (L) 04/24/2020 0412   PROT 6.9 06/28/2015 1600   ALBUMIN 3.1 (L) 04/24/2020 0412   ALBUMIN 4.3 06/28/2015 1600   AST 34 04/24/2020 0412   ALT 27 04/24/2020 0412   ALKPHOS 128 (H) 04/24/2020 0412   BILITOT 0.8  04/24/2020 0412   BILITOT 0.6 06/28/2015 1600   GFRNONAA >60 04/24/2020 0412   GFRNONAA 62 01/25/2020 0820   GFRAA 72 01/25/2020 0820    No results found for: SPEP, UPEP  Lab Results  Component Value Date   WBC 10.1 05/10/2020   NEUTROABS 6.6 05/10/2020   HGB 13.6 05/10/2020   HCT 40.0 05/10/2020   MCV 92.6 05/10/2020   PLT 143 (L) 05/10/2020      Chemistry      Component Value Date/Time   NA 137 04/24/2020 0412   NA 140 06/28/2015 1600   K 3.4 (L) 04/24/2020 0412   CL 105 04/24/2020 0412   CO2 25 04/24/2020 0412   BUN 16 04/24/2020 0412   BUN 12 06/28/2015 1600   CREATININE 0.90 04/24/2020 0412   CREATININE 0.94 01/25/2020 0820      Component Value Date/Time   CALCIUM 8.9 04/24/2020 0412   ALKPHOS 128 (H) 04/24/2020 0412   AST 34 04/24/2020 0412   ALT 27 04/24/2020 0412   BILITOT 0.8 04/24/2020 0412   BILITOT 0.6 06/28/2015 1600       RADIOGRAPHIC STUDIES: I have personally reviewed recent imaging studies I have personally reviewed the radiological images as listed and agreed with the findings in the report.

## 2020-05-10 NOTE — Assessment & Plan Note (Signed)
She has extensive evaluation for pelvic mass She has seen GYN surgeon I agree with recommendation to observe for another 3 months

## 2020-05-10 NOTE — Telephone Encounter (Signed)
Scheduled appt per 4/7 sch msg. Pt aware.  

## 2020-05-10 NOTE — Patient Instructions (Signed)
It was a pleasure meeting you today.  I am recommending a repeat ultrasound in 3 months. If there was concern that your recurrent strokes was related to malignancy (despite CT showing no evidence of metastatic disease and ultrasound of the pelvis without features that significantly increase the risk of malignancy), then I would recommend a PET scan.  In terms of your pap tests. Given persistent HPV, I would recommend that the next pap test you have that HPV subtyping is performed. If you are 16/18 positive, then you should have colposcopy performed.

## 2020-05-10 NOTE — Assessment & Plan Note (Signed)
Her thrombocytopenia is most consistent with ITP She has excellent response to 60 mg of prednisone for the past week I recommend prednisone taper Starting today, she will take prednisone 40 mg daily for 4 days and then starting next week, reduce to 20 mg daily until her next appointment in a week So far, she has no bleeding complications

## 2020-05-11 ENCOUNTER — Ambulatory Visit (INDEPENDENT_AMBULATORY_CARE_PROVIDER_SITE_OTHER): Payer: Medicare Other | Admitting: Family Medicine

## 2020-05-11 ENCOUNTER — Encounter: Payer: Self-pay | Admitting: Family Medicine

## 2020-05-11 ENCOUNTER — Other Ambulatory Visit (HOSPITAL_COMMUNITY)
Admission: RE | Admit: 2020-05-11 | Discharge: 2020-05-11 | Disposition: A | Discharge status: Home / Self Care | Payer: Medicare Other | Source: Ambulatory Visit | Attending: Family Medicine | Admitting: Family Medicine

## 2020-05-11 VITALS — BP 132/82 | HR 89 | Temp 97.8°F | Resp 16 | Ht 66.0 in | Wt 203.0 lb

## 2020-05-11 DIAGNOSIS — E785 Hyperlipidemia, unspecified: Secondary | ICD-10-CM

## 2020-05-11 DIAGNOSIS — R739 Hyperglycemia, unspecified: Secondary | ICD-10-CM | POA: Diagnosis not present

## 2020-05-11 DIAGNOSIS — I639 Cerebral infarction, unspecified: Secondary | ICD-10-CM

## 2020-05-11 DIAGNOSIS — E538 Deficiency of other specified B group vitamins: Secondary | ICD-10-CM | POA: Diagnosis not present

## 2020-05-11 DIAGNOSIS — E039 Hypothyroidism, unspecified: Secondary | ICD-10-CM | POA: Diagnosis not present

## 2020-05-11 DIAGNOSIS — R87810 Cervical high risk human papillomavirus (HPV) DNA test positive: Secondary | ICD-10-CM | POA: Diagnosis not present

## 2020-05-11 DIAGNOSIS — D693 Immune thrombocytopenic purpura: Secondary | ICD-10-CM | POA: Diagnosis not present

## 2020-05-11 DIAGNOSIS — N838 Other noninflammatory disorders of ovary, fallopian tube and broad ligament: Secondary | ICD-10-CM

## 2020-05-11 DIAGNOSIS — M810 Age-related osteoporosis without current pathological fracture: Secondary | ICD-10-CM | POA: Diagnosis not present

## 2020-05-11 DIAGNOSIS — Z8619 Personal history of other infectious and parasitic diseases: Secondary | ICD-10-CM | POA: Insufficient documentation

## 2020-05-11 DIAGNOSIS — F339 Major depressive disorder, recurrent, unspecified: Secondary | ICD-10-CM | POA: Diagnosis not present

## 2020-05-11 DIAGNOSIS — Z01419 Encounter for gynecological examination (general) (routine) without abnormal findings: Secondary | ICD-10-CM | POA: Diagnosis not present

## 2020-05-11 MED ORDER — LEVOTHYROXINE SODIUM 75 MCG PO TABS: 75.0000 ug | ORAL_TABLET | Freq: Every day | ORAL | 1 refills | Status: DC

## 2020-05-16 ENCOUNTER — Other Ambulatory Visit: Payer: Self-pay | Admitting: Family Medicine

## 2020-05-16 ENCOUNTER — Other Ambulatory Visit: Payer: Self-pay

## 2020-05-16 DIAGNOSIS — R87810 Cervical high risk human papillomavirus (HPV) DNA test positive: Secondary | ICD-10-CM

## 2020-05-16 LAB — CYTOLOGY - PAP: Diagnosis: NEGATIVE

## 2020-05-17 ENCOUNTER — Inpatient Hospital Stay (HOSPITAL_BASED_OUTPATIENT_CLINIC_OR_DEPARTMENT_OTHER): Payer: Medicare Other | Admitting: Hematology and Oncology

## 2020-05-17 ENCOUNTER — Inpatient Hospital Stay: Payer: Medicare Other

## 2020-05-17 ENCOUNTER — Encounter: Payer: Self-pay | Admitting: Hematology and Oncology

## 2020-05-17 ENCOUNTER — Other Ambulatory Visit: Payer: Self-pay

## 2020-05-17 DIAGNOSIS — N838 Other noninflammatory disorders of ovary, fallopian tube and broad ligament: Secondary | ICD-10-CM

## 2020-05-17 DIAGNOSIS — Z1273 Encounter for screening for malignant neoplasm of ovary: Secondary | ICD-10-CM

## 2020-05-17 DIAGNOSIS — R971 Elevated cancer antigen 125 [CA 125]: Secondary | ICD-10-CM | POA: Diagnosis not present

## 2020-05-17 DIAGNOSIS — A630 Anogenital (venereal) warts: Secondary | ICD-10-CM | POA: Diagnosis not present

## 2020-05-17 DIAGNOSIS — R6881 Early satiety: Secondary | ICD-10-CM | POA: Diagnosis not present

## 2020-05-17 DIAGNOSIS — I639 Cerebral infarction, unspecified: Secondary | ICD-10-CM | POA: Diagnosis not present

## 2020-05-17 DIAGNOSIS — R1900 Intra-abdominal and pelvic swelling, mass and lump, unspecified site: Secondary | ICD-10-CM

## 2020-05-17 DIAGNOSIS — D696 Thrombocytopenia, unspecified: Secondary | ICD-10-CM

## 2020-05-17 DIAGNOSIS — D398 Neoplasm of uncertain behavior of other specified female genital organs: Secondary | ICD-10-CM | POA: Diagnosis not present

## 2020-05-17 DIAGNOSIS — Z780 Asymptomatic menopausal state: Secondary | ICD-10-CM | POA: Diagnosis not present

## 2020-05-17 LAB — CBC WITH DIFFERENTIAL/PLATELET
Abs Immature Granulocytes: 0.04 10*3/uL (ref 0.00–0.07)
Basophils Absolute: 0 10*3/uL (ref 0.0–0.1)
Basophils Relative: 0 %
Eosinophils Absolute: 0.1 10*3/uL (ref 0.0–0.5)
Eosinophils Relative: 1 %
HCT: 42.4 % (ref 36.0–46.0)
Hemoglobin: 13.6 g/dL (ref 12.0–15.0)
Immature Granulocytes: 0 %
Lymphocytes Relative: 20 %
Lymphs Abs: 2.6 10*3/uL (ref 0.7–4.0)
MCH: 31 pg (ref 26.0–34.0)
MCHC: 32.1 g/dL (ref 30.0–36.0)
MCV: 96.6 fL (ref 80.0–100.0)
Monocytes Absolute: 0.7 10*3/uL (ref 0.1–1.0)
Monocytes Relative: 6 %
Neutro Abs: 9.2 10*3/uL — ABNORMAL HIGH (ref 1.7–7.7)
Neutrophils Relative %: 73 %
Platelets: 198 10*3/uL (ref 150–400)
RBC: 4.39 MIL/uL (ref 3.87–5.11)
RDW: 12.5 % (ref 11.5–15.5)
WBC: 12.6 10*3/uL — ABNORMAL HIGH (ref 4.0–10.5)
nRBC: 0 % (ref 0.0–0.2)

## 2020-05-17 MED ORDER — PREDNISONE 20 MG PO TABS: ORAL_TABLET | ORAL | 1 refills | Status: DC

## 2020-05-17 NOTE — Assessment & Plan Note (Signed)
The cause of her recurrent stroke is unknown but appears to be embolic in nature She will continue antiplatelet therapy She is doing well without new deficit

## 2020-05-17 NOTE — Progress Notes (Signed)
[place] OFFICE PROGRESS NOTE  [name], MD  ASSESSMENT & PLAN:  Thrombocytopenia ([place]) Her thrombocytopenia has resolved with prednisone treatment We will continue prednisone taper I plan to see her again in 2 weeks for further follow-up I am hopeful I can get her off prednisone by the end of the month  Ovarian mass, left She has extensive evaluation for pelvic mass She has seen GYN surgeon I agree with recommendation to observe for another 3 months  Acute CVA (cerebrovascular accident) [facility]) The cause of her recurrent stroke is unknown but appears to be embolic in nature She will continue antiplatelet therapy She is doing well without new deficit   No orders of the defined types were placed in this encounter.   The total time spent in the appointment was 20 minutes encounter with patients including review of chart and various tests results, discussions about plan of care and coordination of care plan   All questions were answered. The patient knows to call the clinic with any problems, questions or concerns. No barriers to learning was detected.    [name], MD 4/14/202212:26 PM  INTERVAL HISTORY: Natalie [name] 70 y.o. female returns for further follow-up She is doing well with prednisone taper Apart from some bruises, she denies bleeding  SUMMARY OF HEMATOLOGIC HISTORY: Natalie [name] 70 y.o. female is seen in the clinic today as a hospital follow-up Please see my consult note dated April 23, 2020 for further details  She presented with difficulty speaking and diplopia in this hospital stay.  She was also having difficulty thinking.  A code stroke was called.  CT head showed no acute intracranial hemorrhage and no definite acute infarction.  CTA head and neck showed nearly occlusive thrombus at the bifurcation of a proximal M2 MCA branch approximately 6 mm from the MCA bifurcation, subsequent occlusion of a proximal left M3 MCA branch,  no stenosis in the neck.  MRI brain without contrast showed patchy small volume acute ischemic nonhemorrhagic posterior left MCA distribution infarcts likely reflecting embolic shower.   She was noted to have thrombocytopenia on admission with a platelet count of 93,000. Platelet count slowly drifting down this admission 93>77>68>77>66>59.  The most recent CBC available to me performed prior to this admission was done on 02/28/2020 and at that time, her platelet count was 108,000.  The patient is receiving several antiplatelet and anticoagulant medications including aspirin 81 mg daily, Plavix 75 mg daily, Lovenox 40 mg daily.  Review of her chart show that the aspirin and Plavix were started at the time of her initial CVA in January 2022.  She tells me that she was off the Plavix for short period time but recently restarted on it just prior to this admission.  The patient underwent TEE earlier today.  Report reviewed and there was no source of embolus but moderate LAE is suggestive of possible PAF.  Feeling better overall.  She is not having any diplopia today.  Her speech is improving but she still has some slurred speech and difficulty with word finding.  She currently denies bleeding.  She is not having any fevers, chills, headaches, chest pain, shortness of breath, abdominal pain, nausea, vomiting. Hematology was asked to see the patient make recommendations regarding her thrombocytopenia. In the hospital, she had extensive evaluation Serum vitamin R67 and folic acid level were normal CT imaging of abdomen and pelvis were performed to rule out splenomegaly and that revealed pelvic mass The patient did not  receive platelet transfusion while hospitalized She was discharged home on medical management  Since she left the hospital, she feels fine The patient denies any recent signs or symptoms of bleeding such as spontaneous epistaxis, hematuria or hematochezia. On 05/03/2020, she was started on daily  prednisone 60 mg for ITP On 05/10/2020, the dose of prednisone is reduced to 40 mg taper course On 05/17/20, she is recommended to continue prednisone taper  I have reviewed the past medical history, past surgical history, social history and family history with the patient and they are unchanged from previous note.  ALLERGIES:  is allergic to sulfamethoxazole-trimethoprim.  MEDICATIONS:  Current Outpatient Medications  Medication Sig Dispense Refill  . atorvastatin (LIPITOR) 40 MG tablet Take 1 tablet (40 mg total) by mouth daily. 90 tablet 1  . buPROPion (WELLBUTRIN SR) 150 MG 12 hr tablet Take 150 mg by mouth 2 (two) times daily.    . citalopram (CELEXA) 40 MG tablet Take 40 mg by mouth daily.    . clindamycin-benzoyl peroxide (BENZACLIN) gel Apply topically 2 (two) times daily. (Patient taking differently: Apply 1 application topically 2 (two) times daily as needed (facial acne).) 50 g 0  . clopidogrel (PLAVIX) 75 MG tablet Take 1 tablet (75 mg total) by mouth daily. 30 tablet 1  . Cyanocobalamin (VITAMIN B-12) 1000 MCG SUBL Place 1,000 mcg under the tongue at bedtime.    Marland Kitchen levothyroxine (SYNTHROID) 75 MCG tablet Take 1 tablet (75 mcg total) by mouth daily before breakfast. 90 tablet 1  . Multiple Vitamin (MULTIVITAMIN WITH MINERALS) TABS tablet Take 1 tablet by mouth daily.    . Omega-3 Fatty Acids (FISH OIL) 1000 MG CAPS Take 1 capsule by mouth daily.    . polyvinyl alcohol (LIQUIFILM TEARS) 1.4 % ophthalmic solution Place 1 drop into both eyes daily as needed for dry eyes.    . predniSONE (DELTASONE) 20 MG tablet Starting 4/15, 10 mg daily prednisone for 7 days, then reduce to 10 mg every other day starting 4/22 90 tablet 1  . topiramate (TOPAMAX) 25 MG tablet Take 25 mg by mouth daily.     . Vitamin D, Ergocalciferol, (DRISDOL) 1.25 MG (50000 UNIT) CAPS capsule TAKE 1 CAPSULE BY MOUTH EVERY 7 DAYS (Patient taking differently: Take 50,000 Units by mouth every Tuesday.) 12 capsule 1   No  current facility-administered medications for this visit.     REVIEW OF SYSTEMS:   Constitutional: Denies fevers, chills or night sweats Eyes: Denies blurriness of vision Ears, [name], mouth, throat, and face: Denies mucositis or sore throat Respiratory: Denies cough, dyspnea or wheezes Cardiovascular: Denies palpitation, chest discomfort or lower extremity swelling Gastrointestinal:  Denies nausea, heartburn or change in bowel habits Skin: Denies abnormal skin rashes Lymphatics: Denies new lymphadenopathy Neurological:Denies numbness, tingling or new weaknesses Behavioral/Psych: Mood is stable, no new changes  All other systems were reviewed with the patient and are negative.  PHYSICAL EXAMINATION: ECOG PERFORMANCE STATUS: 0 - Asymptomatic  Vitals:   05/17/20 1130  BP: 123/82  Pulse: 75  Resp: 18  Temp: 97.7 F (36.5 C)  SpO2: 98%   Filed Weights   05/17/20 1130  Weight: 202 lb (91.6 kg)    GENERAL:alert, no distress and comfortable NEURO: alert & oriented x 3 with fluent speech, no focal motor/sensory deficits  LABORATORY DATA:  I have reviewed the data as listed     Component Value Date/Time   NA 137 04/24/2020 0412   NA 140 06/28/2015 1600   K 3.4 (  L) 04/24/2020 0412   CL 105 04/24/2020 0412   CO2 25 04/24/2020 0412   GLUCOSE 101 (H) 04/24/2020 0412   BUN 16 04/24/2020 0412   BUN 12 06/28/2015 1600   CREATININE 0.90 04/24/2020 0412   CREATININE 0.94 01/25/2020 0820   CALCIUM 8.9 04/24/2020 0412   PROT 5.8 (L) 04/24/2020 0412   PROT 6.9 06/28/2015 1600   ALBUMIN 3.1 (L) 04/24/2020 0412   ALBUMIN 4.3 06/28/2015 1600   AST 34 04/24/2020 0412   ALT 27 04/24/2020 0412   ALKPHOS 128 (H) 04/24/2020 0412   BILITOT 0.8 04/24/2020 0412   BILITOT 0.6 06/28/2015 1600   GFRNONAA >60 04/24/2020 0412   GFRNONAA 62 01/25/2020 0820   GFRAA 72 01/25/2020 0820    No results found for: SPEP, UPEP  Lab Results  Component Value Date   WBC 12.6 (H) 05/17/2020    NEUTROABS 9.2 (H) 05/17/2020   HGB 13.6 05/17/2020   HCT 42.4 05/17/2020   MCV 96.6 05/17/2020   PLT 198 05/17/2020      Chemistry      Component Value Date/Time   NA 137 04/24/2020 0412   NA 140 06/28/2015 1600   K 3.4 (L) 04/24/2020 0412   CL 105 04/24/2020 0412   CO2 25 04/24/2020 0412   BUN 16 04/24/2020 0412   BUN 12 06/28/2015 1600   CREATININE 0.90 04/24/2020 0412   CREATININE 0.94 01/25/2020 0820      Component Value Date/Time   CALCIUM 8.9 04/24/2020 0412   ALKPHOS 128 (H) 04/24/2020 0412   AST 34 04/24/2020 0412   ALT 27 04/24/2020 0412   BILITOT 0.8 04/24/2020 0412   BILITOT 0.6 06/28/2015 1600

## 2020-05-17 NOTE — Assessment & Plan Note (Signed)
She has extensive evaluation for pelvic mass She has seen GYN surgeon I agree with recommendation to observe for another 3 months

## 2020-05-17 NOTE — Assessment & Plan Note (Signed)
Her thrombocytopenia has resolved with prednisone treatment We will continue prednisone taper I plan to see her again in 2 weeks for further follow-up I am hopeful I can get her off prednisone by the end of the month

## 2020-05-21 ENCOUNTER — Ambulatory Visit: Payer: Medicare Other | Attending: Family Medicine

## 2020-05-21 DIAGNOSIS — R2681 Unsteadiness on feet: Secondary | ICD-10-CM

## 2020-05-21 DIAGNOSIS — M6281 Muscle weakness (generalized): Secondary | ICD-10-CM | POA: Diagnosis not present

## 2020-05-21 DIAGNOSIS — R2689 Other abnormalities of gait and mobility: Secondary | ICD-10-CM | POA: Diagnosis not present

## 2020-05-21 DIAGNOSIS — I63412 Cerebral infarction due to embolism of left middle cerebral artery: Secondary | ICD-10-CM | POA: Diagnosis not present

## 2020-05-21 NOTE — Therapy (Signed)
[place] MAIN [facility] SERVICES [address] [place], [place], [address] Phone: [phone]   Fax:  [phone]  Physical Therapy Evaluation  Patient Details  Name: Natalie [name] MRN: [id number] Date of Birth: May 08, 1950 Referring Provider (PT): [name], MD   Encounter Date: 05/21/2020   PT End of Session - 05/21/20 1924    Visit Number 1    Number of Visits 25    Date for PT Re-Evaluation 08/13/20    Authorization Time Period eval performed 05/21/2020    PT Start Time 1605    PT Stop Time 1706    PT Time Calculation (min) 61 min    Equipment Utilized During Treatment Gait belt    Activity Tolerance Patient tolerated treatment well    Behavior During Therapy [facility] for tasks assessed/performed           Past Medical History:  Diagnosis Date  . Acute bronchospasm due to viral infection   . Anemia    history of  . CVA (cerebral vascular accident) ([place]) 02/28/2020  . Depressive disorder   . Epistaxis   . Fatigue   . Hearing loss   . Hypothyroidism   . Memory change   . Migraine   . Numbness and tingling   . Obesity (BMI 30.0-34.9)   . Osteoarthritis   . Osteoporosis   . Polyuria   . Premature menopause   . Seizures ([place])    history of mini seizures, possible migraine induced    Past Surgical History:  Procedure Laterality Date  . ABLATION  2006  . BUBBLE STUDY  04/23/2020   Procedure: BUBBLE STUDY;  Surgeon: Larey Dresser, MD;  Location: [facility] ENDOSCOPY;  Service: Cardiovascular;;  . CATARACT EXTRACTION W/ INTRAOCULAR LENS IMPLANT Bilateral   . COLONOSCOPY    . COLONOSCOPY WITH PROPOFOL N/A 12/22/2018   Procedure: COLONOSCOPY WITH PROPOFOL;  Surgeon: Virgel Manifold, MD;  Location: [facility] ENDOSCOPY;  Service: Endoscopy;  Laterality: N/A;  . EYE SURGERY  Spring 2018   Cataracts both eyes  . LOOP RECORDER INSERTION N/A 04/23/2020   Procedure: LOOP RECORDER INSERTION;  Surgeon: Evans Lance, MD;  Location: [facility] CV LAB;  Service: Cardiovascular;  Laterality: N/A;  . TEE WITHOUT CARDIOVERSION N/A 04/23/2020   Procedure: TRANSESOPHAGEAL ECHOCARDIOGRAM (TEE);  Surgeon: Larey Dresser, MD;  Location[facility] ENDOSCOPY;  Service: Cardiovascular;  Laterality: N/A;  . TONSILLECTOMY      There were no vitals filed for this visit.    Subjective Assessment - 05/21/20 1603    Subjective Pt presents to PT ambulating without an AD. Pt reports she was provided with SPC at d/c but does not use it and does not feel she needs it.  Pt reports her sx with most recent stroke included double vision and "not thinking straight." She states she "does not notice a difference" in her strength since her stroke, but that she has "always" had greater difficulty with R LE than L LE d/t her R hip. 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 The pt is concerned about her balance. She says she sometimes feels dizzy and off-balance. She denies spinning sensations but endorses feeling light-headed and says, "at times I feel I'm breaking out in a light sweat." She reports her vision is still affected and that "everything looks a little more intense." She has difficulty walking on uneven surfaces. She reports  FOF. She has had one fall in the last six months and says it was d/t her dog running into her. The pt states, "I don't go upstairs much anymore," and "I try to be careful. I really don't want to fall, because sometimes I'm unsteady on my feet, or turn my ankle."  Pt also reports difficulty with writing that she thinks this difficulty started prior to strokes. Pt reports she mixes up letters when trying to write a "d" vs a "b," and also has difficulty with vowels.    Pertinent History Pt confirms the following hx is accurate (initially from acute care PT eval): "the pt is a 70 yo female presenting 3/17 with confusion and difficulty word-finding. Imaging revealed L MCA infarcts  with nearly occlusive thrombus in proximal M2 and M3." PMH includes the following: CVA in Jan 2022 (for further details see ACUTE PT eval in EMR from 02/29/2020), anxiety, hypothyroidism, depression, OA, osteoporosis, memory change, migraine, seizures, hearing loss.    Limitations Standing;Walking;Lifting;House hold activities;Writing   she says she limits how heavy she lifts due to hesitation   How long can you sit comfortably? not limited    How long can you stand comfortably? Pt reports she doesn't like to stand still for very long    How long can you walk comfortably? Difficulty with balance when walking over uneven ground, but states she is able to walk a mile. She says sometimes she gets tired walking and will sit down. She feels overall she's been fairly energetic.    Diagnostic tests per chart: "[place] 04/19/20: MR BRAIN IMPRESSION:  1. Patchy small volume acute ischemic nonhemorrhagic posterior left  MCA distribution infarcts, likely reflecting an embolic shower. No  associated hemorrhage or mass effect.  2. No other acute intracranial abnormality.  3. Underlying mild chronic microvascular ischemic disease.; 3/17 CT ANGIO HEAD NECK:  IMPRESSION:  Nearly occlusive thrombus at the bifurcation of a proximal M2 MCA  branch approximately 6 mm from the MCA bifurcation. Subsequent  occlusion of a proximal left M3 MCA branch. No stenosis in the neck."    Patient Stated Goals Pt says she wants to improve her balance and does not want to fall again    Currently in Pain? No/denies          SUBJECTIVE Chief complaint: decreased balance, dizziness Onset: 04/19/2020 Follow-up appointment with MD: has follow-up with cardiologist tomorrow  OBJECTIVE  MUSCULOSKELETAL: Tremor: Absent Bulk: Normal  Posture Increased foot adduction   Gait Decreased heel strike B, pt with R-side trunk lean, adduction of feet  Strength R/L 4/4 Hip flexion 4/4 Hip abduction 4/4 Hip adduction 5/5 Knee extension 4+/4+  Knee flexion 5/5 Ankle Plantarflexion 4/4 Ankle Dorsiflexion  NEUROLOGICAL:  Sensation Sensation testing deferred Proprioception and hot/cold testing deferred on this date  FUNCTIONAL OUTCOME MEASURES  FOTO: 74 (greater than d/c score prediction of 69)   Results Comments  DGI 18/24   TUG 10.3 seconds   5TSTS 14.38 seconds   10 Meter Gait Speed DEFERRED Below normative values for full community ambulation  ABC Scale 63.75% Greatest perceived difficulty with stepping onto/off escalator while holding bags, walking on icy sidewalks, stepping on/off escalator while holding railing, being bumped into by people while walking, ascending/descending steps, standing on tip-toes and reaching, standing on chair and reaching  [place] TESTS   Modified Clinical Test of Sensory Interaction for Balance    (CTSIB):  CONDITION TIME SWAY  Eyes open, firm  surface 30 seconds no  Eyes closed, firm surface 30 seconds yes  Eyes open, foam surface 30 seconds yes  Eyes closed, foam surface Unable to maintain balance Yes, requires UE support and min assistx1 to regain balance d/t LOB    ASSESSMENT Clinical Impression: Pt is a pleasant 70 year-old female referred to PT s/p CVA (04/19/2020: L MCA infarcts). PT examination reveals deficits/impairments in BLE strength, gait mechanics, balance, balance confidence and overall functional mobility as evidenced by pt scores on MMT, DGI, CTSIB-M, ABC balance confidence scale and FOTO score (see note for specifics). Pt does report dizziness/light-headedness which is to be further assessed next session with [place] (unable to assess today due to time). Pt's deficits are impacting her functional mobility within her home and out in the community, as pt is avoiding going upstairs in her house and avoiding walking places with uneven surfaces. PT also instructed pt to discuss her difficulties with writing/speech with her physician for possible follow-up  with speech. Pt verbalized understanding. The pt will benefit from skilled PT services to address the aforementioned deficits in order to improve BLE strength, gait mechanics, and balance in order to improve QOL and decrease future risk of falls.   PLAN Next Visit: further assess dynamic balance, dizziness with DHI, 10MWT HEP: to be initiated next session    Objective measurements completed on examination: See above findings.     Patient will benefit from skilled therapeutic intervention in order to improve the following deficits and impairments:  Abnormal gait,Decreased balance,Decreased endurance,Difficulty walking,Decreased range of motion,Dizziness,Improper body mechanics,Impaired vision/preception,Decreased coordination,Decreased strength,Decreased mobility,Obesity,Decreased activity tolerance,Decreased knowledge of use of DME,Impaired flexibility,Postural dysfunction  Visit Diagnosis: Other abnormalities of gait and mobility  Muscle weakness (generalized)  Unsteadiness on feet  Cerebrovascular accident (CVA) due to embolism of left middle cerebral artery [facility] [place])    Plan - 05/21/20 1633    Clinical Impression Statement Pt is a pleasant 70 year-old female referred to PT s/p CVA (04/19/2020: L MCA infarcts). PT examination reveals deficits/impairments in BLE strength, gait mechanics, balance, balance confidence and overall functional mobility as evidenced by pt scores on MMT, DGI, CTSIB-M, ABC balance confidence scale and FOTO score (see note for specifics). Pt does report dizziness/light-headedness which is to be further assessed next session with [place] (unable to assess today due to time). Pt's deficits are impacting her functional mobility within her home and out in the community, as pt is avoiding going upstairs in her house and avoiding walking places with uneven surfaces. PT also instructed pt to discuss her difficulties with writing/speech with her physician for possible follow-up with  speech. Pt verbalized understanding. The pt will benefit from skilled PT services to address the aforementioned deficits in order to improve BLE strength, gait mechanics, and balance in order to improve QOL and decrease future risk of falls.    Personal Factors and Comorbidities Age;Comorbidity 1;Comorbidity 2;Past/Current Experience;Sex;Social Background;Behavior Pattern;Comorbidity 3+    Comorbidities CVA in Jan 2022, anxiety, hypothyroidism, depression, OA, osteoporosis, memory change, migraine, seizures, hearing loss.    Examination-Activity Limitations Bathing;Stairs;Stand;Hygiene/Grooming;Locomotion Level;Carry;Bed Mobility;Other;Reach Overhead;Lift   bed mobility difficulty d/t pt reports of high mattress; reported reaching overhead d/t balance (via ABC scale)   Examination-Participation Restrictions Community Activity;Yard Work;Shop;Cleaning;Laundry;Medication Management;Meal Prep    Stability/Clinical Decision Making Evolving/Moderate complexity    Clinical Decision Making Moderate    Rehab Potential Good    PT Frequency 2x / week    PT Duration 12 weeks    PT Treatment/Interventions ADLs/Self Care Home Management;Biofeedback;Canalith Repostioning;Cryotherapy;Dealer  Stimulation;Aquatic Therapy;Moist Heat;Ultrasound;DME Instruction;Gait training;Stair training;Functional mobility training;Therapeutic activities;Therapeutic exercise;Balance training;Neuromuscular re-education;Cognitive remediation;Patient/family education;Orthotic Fit/Training;Manual techniques;Passive range of motion;Energy conservation;Taping;Splinting;Vestibular;Visual/perceptual remediation/compensation;Joint Manipulations    PT Next Visit Plan initiate HEP, higher level balance assessment, DHI    PT Home Exercise Plan to be initiated next session    Recommended Other Services recommended pt to ask her doctor about consult with speech    Consulted and Agree with Plan of Care Patient            Problem  List Patient Active Problem List   Diagnosis Date Noted  . Acute ITP ([place]) 05/02/2020  . Other intra-abdominal and pelvic swelling, mass and lump 04/26/2020  . Pelvic mass in female 04/25/2020  . Ovarian cancer screening 04/25/2020  . Ovarian mass, left 04/25/2020  . Stroke (cerebrum) ([place]) 04/20/2020  . Acute CVA (cerebrovascular accident) ([place]) 04/19/2020  . CVA (cerebral vascular accident) ([place]) 04/19/2020  . History of ischemic multifocal posterior circulation stroke 02/29/2020  . Thrombocytopenia ([place]) 02/28/2020  . HPV test positive 07/08/2015  . Difficulty hearing 06/28/2015  . Arthritis, degenerative 06/28/2015  . Obesity, Class I, BMI 30-34.9 06/28/2015  . Paresthesia 06/28/2015  . Purpura, nonthrombopenic ([place]) 06/28/2015  . Other specified hypothyroidism 08/09/2014  . Migraine without aura and without status migrainosus, not intractable 09/19/2008  . Moderate major depression ([place]) 09/07/2006  . OP (osteoporosis) 09/07/2006   Ricard Dillon PT, DPT 05/21/2020, 8:07 PM  [place] MAIN [facility] SERVICES [address] [place], [place], [address] Phone: [phone]   Fax:  [phone]  Name: Natalie [name] MRN: [id number] Date of Birth: 22-Jan-1951

## 2020-05-23 DIAGNOSIS — E785 Hyperlipidemia, unspecified: Secondary | ICD-10-CM | POA: Diagnosis not present

## 2020-05-23 DIAGNOSIS — R609 Edema, unspecified: Secondary | ICD-10-CM | POA: Diagnosis not present

## 2020-05-23 DIAGNOSIS — E669 Obesity, unspecified: Secondary | ICD-10-CM | POA: Diagnosis not present

## 2020-05-23 DIAGNOSIS — R0602 Shortness of breath: Secondary | ICD-10-CM | POA: Diagnosis not present

## 2020-05-23 DIAGNOSIS — I208 Other forms of angina pectoris: Secondary | ICD-10-CM | POA: Diagnosis not present

## 2020-05-23 DIAGNOSIS — R002 Palpitations: Secondary | ICD-10-CM | POA: Diagnosis not present

## 2020-05-23 DIAGNOSIS — I10 Essential (primary) hypertension: Secondary | ICD-10-CM | POA: Diagnosis not present

## 2020-05-23 DIAGNOSIS — Z87898 Personal history of other specified conditions: Secondary | ICD-10-CM | POA: Diagnosis not present

## 2020-05-23 DIAGNOSIS — I6340 Cerebral infarction due to embolism of unspecified cerebral artery: Secondary | ICD-10-CM | POA: Diagnosis not present

## 2020-05-28 ENCOUNTER — Ambulatory Visit (INDEPENDENT_AMBULATORY_CARE_PROVIDER_SITE_OTHER): Payer: Medicare Other

## 2020-05-28 DIAGNOSIS — I63412 Cerebral infarction due to embolism of left middle cerebral artery: Secondary | ICD-10-CM

## 2020-05-29 LAB — CUP PACEART REMOTE DEVICE CHECK
Date Time Interrogation Session: 20220425101740
Implantable Pulse Generator Implant Date: 20220321

## 2020-05-30 ENCOUNTER — Other Ambulatory Visit: Payer: Self-pay

## 2020-05-30 ENCOUNTER — Inpatient Hospital Stay (HOSPITAL_BASED_OUTPATIENT_CLINIC_OR_DEPARTMENT_OTHER): Payer: Medicare Other | Admitting: Hematology and Oncology

## 2020-05-30 ENCOUNTER — Encounter: Payer: Self-pay | Admitting: Hematology and Oncology

## 2020-05-30 ENCOUNTER — Ambulatory Visit: Payer: Medicare Other

## 2020-05-30 ENCOUNTER — Inpatient Hospital Stay: Payer: Medicare Other

## 2020-05-30 DIAGNOSIS — I63412 Cerebral infarction due to embolism of left middle cerebral artery: Secondary | ICD-10-CM | POA: Diagnosis not present

## 2020-05-30 DIAGNOSIS — D696 Thrombocytopenia, unspecified: Secondary | ICD-10-CM

## 2020-05-30 DIAGNOSIS — R2681 Unsteadiness on feet: Secondary | ICD-10-CM

## 2020-05-30 DIAGNOSIS — R2689 Other abnormalities of gait and mobility: Secondary | ICD-10-CM

## 2020-05-30 DIAGNOSIS — A630 Anogenital (venereal) warts: Secondary | ICD-10-CM | POA: Diagnosis not present

## 2020-05-30 DIAGNOSIS — D398 Neoplasm of uncertain behavior of other specified female genital organs: Secondary | ICD-10-CM | POA: Diagnosis not present

## 2020-05-30 DIAGNOSIS — R1900 Intra-abdominal and pelvic swelling, mass and lump, unspecified site: Secondary | ICD-10-CM

## 2020-05-30 DIAGNOSIS — I639 Cerebral infarction, unspecified: Secondary | ICD-10-CM

## 2020-05-30 DIAGNOSIS — Z1273 Encounter for screening for malignant neoplasm of ovary: Secondary | ICD-10-CM

## 2020-05-30 DIAGNOSIS — Z780 Asymptomatic menopausal state: Secondary | ICD-10-CM | POA: Diagnosis not present

## 2020-05-30 DIAGNOSIS — M6281 Muscle weakness (generalized): Secondary | ICD-10-CM | POA: Diagnosis not present

## 2020-05-30 DIAGNOSIS — R6881 Early satiety: Secondary | ICD-10-CM | POA: Diagnosis not present

## 2020-05-30 DIAGNOSIS — R971 Elevated cancer antigen 125 [CA 125]: Secondary | ICD-10-CM | POA: Diagnosis not present

## 2020-05-30 DIAGNOSIS — N838 Other noninflammatory disorders of ovary, fallopian tube and broad ligament: Secondary | ICD-10-CM

## 2020-05-30 LAB — CBC WITH DIFFERENTIAL/PLATELET
Abs Immature Granulocytes: 0.01 10*3/uL (ref 0.00–0.07)
Basophils Absolute: 0 10*3/uL (ref 0.0–0.1)
Basophils Relative: 1 %
Eosinophils Absolute: 0.2 10*3/uL (ref 0.0–0.5)
Eosinophils Relative: 3 %
HCT: 41.5 % (ref 36.0–46.0)
Hemoglobin: 13.2 g/dL (ref 12.0–15.0)
Immature Granulocytes: 0 %
Lymphocytes Relative: 31 %
Lymphs Abs: 1.9 10*3/uL (ref 0.7–4.0)
MCH: 31.1 pg (ref 26.0–34.0)
MCHC: 31.8 g/dL (ref 30.0–36.0)
MCV: 97.9 fL (ref 80.0–100.0)
Monocytes Absolute: 0.3 10*3/uL (ref 0.1–1.0)
Monocytes Relative: 5 %
Neutro Abs: 3.7 10*3/uL (ref 1.7–7.7)
Neutrophils Relative %: 60 %
Platelets: 202 10*3/uL (ref 150–400)
RBC: 4.24 MIL/uL (ref 3.87–5.11)
RDW: 12.5 % (ref 11.5–15.5)
WBC: 6.1 10*3/uL (ref 4.0–10.5)
nRBC: 0 % (ref 0.0–0.2)

## 2020-05-30 NOTE — Assessment & Plan Note (Signed)
She was seen and evaluated by Dr. Tucker recently for pelvic mass She is scheduled for repeat ultrasound of her pelvis in July I will keep an eye on the result and determine the next step 

## 2020-05-30 NOTE — Assessment & Plan Note (Signed)
The most likely cause of her recent acute ITP is immune related illness With prednisone therapy, her thrombocytopenia has resolved She tolerated prednisone taper well She will stop treatment this week and I plan to see her again next month for further follow-up The patient is educated to watch out for signs and symptoms of bleeding

## 2020-05-30 NOTE — Assessment & Plan Note (Signed)
She is recovering well from her stroke except for balance issues She will continue physical therapy and rehab She is currently on dual anticoagulation therapy with antiplatelet agent She bruises quite a bit, likely exacerbated by prednisone therapy and fish oil use We will stop prednisone this week I recommend against taking fish oil now I am hopeful she can get off Plavix in the near future but would defer the plan to her neurologist

## 2020-05-30 NOTE — Therapy (Signed)
[place] MAIN [facility] SERVICES [address] [place], [place], [address] Phone: [phone]   Fax:  [phone]  Physical Therapy Treatment  Patient Details  Name: Natalie [name] MRN: [id number] Date of Birth: 05/29/1950 Referring Provider (PT): [name], MD   Encounter Date: 05/30/2020   PT End of Session - 05/30/20 1601    Visit Number 2    Number of Visits 25    Date for PT Re-Evaluation 08/13/20    Authorization Time Period eval performed 05/21/2020    PT Start Time 1441    PT Stop Time 1525    PT Time Calculation (min) 44 min    Equipment Utilized During Treatment Gait belt    Activity Tolerance Patient tolerated treatment well    Behavior During Therapy [facility] for tasks assessed/performed           Past Medical History:  Diagnosis Date  . Acute bronchospasm due to viral infection   . Anemia    history of  . CVA (cerebral vascular accident) ([place]) 02/28/2020  . Depressive disorder   . Epistaxis   . Fatigue   . Hearing loss   . Hypothyroidism   . Memory change   . Migraine   . Numbness and tingling   . Obesity (BMI 30.0-34.9)   . Osteoarthritis   . Osteoporosis   . Polyuria   . Premature menopause   . Seizures ([place])    history of mini seizures, possible migraine induced    Past Surgical History:  Procedure Laterality Date  . ABLATION  2006  . BUBBLE STUDY  04/23/2020   Procedure: BUBBLE STUDY;  Surgeon: Larey Dresser, MD;  Location: [facility] ENDOSCOPY;  Service: Cardiovascular;;  . CATARACT EXTRACTION W/ INTRAOCULAR LENS IMPLANT Bilateral   . COLONOSCOPY    . COLONOSCOPY WITH PROPOFOL N/A 12/22/2018   Procedure: COLONOSCOPY WITH PROPOFOL;  Surgeon: Virgel Manifold, MD;  Location: [facility] ENDOSCOPY;  Service: Endoscopy;  Laterality: N/A;  . EYE SURGERY  Spring 2018   Cataracts both eyes  . LOOP RECORDER INSERTION N/A 04/23/2020   Procedure: LOOP RECORDER INSERTION;  Surgeon: Evans Lance, MD;  Location: [place] CV LAB;  Service: Cardiovascular;  Laterality: N/A;  . TEE WITHOUT CARDIOVERSION N/A 04/23/2020   Procedure: TRANSESOPHAGEAL ECHOCARDIOGRAM (TEE);  Surgeon: Larey Dresser, MD;  Location: [facility] ENDOSCOPY;  Service: Cardiovascular;  Laterality: N/A;  . TONSILLECTOMY      There were no vitals filed for this visit.   Subjective Assessment - 05/30/20 1543    Pertinent History Pt confirms the following hx is accurate (initially from acute care PT eval): "the pt is a 70 yo female presenting 3/17 with confusion and difficulty word-finding. Imaging revealed L MCA infarcts with nearly occlusive thrombus in proximal M2 and M3." PMH includes the following: CVA in Jan 2022 (for further details see ACUTE PT eval in EMR from 02/29/2020), anxiety, hypothyroidism, depression, OA, osteoporosis, memory change, migraine, seizures, hearing loss.    Limitations Standing;Walking;Lifting;House hold activities;Writing   she says she limits how heavy she lifts due to hesitation   How long can you sit comfortably? not limited    How long can you stand comfortably? Pt reports she doesn't like to stand still for very long    How long can you walk comfortably? Difficulty with balance when walking over uneven ground, but states she is able to walk a mile. She says sometimes she gets tired walking and will sit down. She  feels overall she's been fairly energetic.    Diagnostic tests per chart: "[place] 04/19/20: MR BRAIN IMPRESSION:  1. Patchy small volume acute ischemic nonhemorrhagic posterior left  MCA distribution infarcts, likely reflecting an embolic shower. No  associated hemorrhage or mass effect.  2. No other acute intracranial abnormality.  3. Underlying mild chronic microvascular ischemic disease.; 3/17 CT ANGIO HEAD NECK:  IMPRESSION:  Nearly occlusive thrombus at the bifurcation of a proximal M2 MCA  branch approximately 6 mm from the MCA bifurcation. Subsequent  occlusion of a proximal left M3 MCA branch. No  stenosis in the neck."    Patient Stated Goals Pt says she wants to improve her balance and does not want to fall again          TREATMENT  10MWT: 1.2 m/s. Noted reduced stance time on R LE with gait. Pt reports her R hip sometimes aches, but does not currently.  Neuro Re-Ed: CGA-mod assist for the following  DHI: score is 16, indicating mild handicap  Standing on foam WBOS with UE reaches - 2x10 each UE  Standing on foam NBOS with UE reaches - 2x10 each UE; pt with increased sway  Standing on foam horizontal and vertical head turns WBOS - 1x20 for each. Pt with greater difficulty with vertical head turns.  Standing on foam horizontal and vertical head turns NBOS - 1x20 for each. Pt with greater difficulty with vertical head turns. She requires up to min assist and intermittent UE support.  Stepping over hurdle forward/backward - x multiple reps. Close CGA-min assist due to pt staggering, particularly when stepping backward. VC to increase step length/height.   Agility ladder forward/backward - 6x each, close CGA-mod assist due pt LOB in order to help pt regain balance.  BP positional testing: pt denies sx throughout Supine BP: 125/68 HR 70 bpm Seated BP: 112/69 HR 73 bpm Standing BP: 107/68 HR 80 bpm   Assessment: Further assessment completed this session. Pt DHI score is 16, indicating mild handicap. Pt gait speed is 1.2 m/s, where pt exhibits decreased stance time on RLE. Pt BP assessed for positional changes, where she saw most notable drop in systolic pressure from 160/73 (supine) to 112/69 (seated) to 107/68 (standing). Pt did not report any symptoms with BP testing. Pt most challenged with tasks involving clearing a hurdle, and walking/stepping backward, requiring up to mod assist to regain balance after LOB. For clearing hurdles, pt was cued to increase step height and length. Pt reports she feels she does not take big enough steps, possibly secondary to LE decreases in  strength seen on eval.  PT has added DHI goal. The pt will benefit from further skilled therapy to improve balance and BLE strength to increase safety with all functional mobility.     Education provided with VC/TC and demonstration for movement at target joints and to facilitate correct muscle activation with all exercises.    PT Short Term Goals - 05/21/20 1639      PT SHORT TERM GOAL #1   Title Pt will be independent with HEP in order to improve strength and balance in order to decrease fall risk and improve function at home and work.    Baseline 4/18: to be initiated    Time 6    Period Weeks    Status New    Target Date 07/02/20             PT Long Term Goals - 05/30/20 1553  PT LONG TERM GOAL #1   Title Pt will improve DGI by at least 3 points in order to demonstrate clinically significant improvement in balance and decreased risk for falls.    Baseline 4/18: 18/24    Time 12    Period Weeks    Status New    Target Date 08/13/20      PT LONG TERM GOAL #2   Title Pt will improve ABC scale by at least 13% in order to demonstrate clinically significant improvement in balance confidence.    Baseline 4/18: 63.75%    Time 12    Period Weeks    Status New    Target Date 08/13/20      PT LONG TERM GOAL #3   Title Pt will demonstrate improved ability to maintain balance on compliant surface with EC for at least 5 seconds to indicate improved ability to navigate uneven surfaces and ambulate in dimly lit rooms.    Baseline 4/18: Pt unable to maintain balance with EC on foam on CTSIB-M    Time 12    Period Weeks    Status New    Target Date 08/13/20      PT LONG TERM GOAL #4   Title Patient will increase BLE gross strength to 4+/5 as to improve functional strength for independent gait, increased standing tolerance and increased ADL ability.    Baseline 4/18: gross BLE strength currnelty 4/5    Time 12    Period Weeks    Status New    Target Date 08/13/20      PT  LONG TERM GOAL #5   Title Patient will increase FOTO score to equal to or greater than 80 to demonstrate improvement in mobility and quality of life.    Baseline 4/18: 74%    Time 12    Period Weeks    Status New    Target Date 08/13/20      PT LONG TERM GOAL #6   Title Patient will reduce dizziness handicap inventory score to <10, for less dizziness with ADLs and increased safety with home and work tasks.    Baseline 4/27: DHI score is 16, indicating mild handicap    Time 11    Period Weeks    Status New    Target Date 08/13/20                 Plan - 05/30/20 1600    Clinical Impression Statement Further assessment completed this session. Pt DHI score is 16, indicating mild handicap. Pt gait speed is 1.2 m/s, where pt exhibits decreased stance time on RLE. Pt BP assessed for positional changes, where she saw most notable drop in systolic pressure from 176/16 (supine) to 112/69 (seated) to 107/68 (standing). Pt did not report any symptoms with BP testing. Pt most challenged with tasks involving clearing a hurdle, and walking/stepping backward, requiring up to mod assist to regain balance after LOB. For clearing hurdles, pt was cued to increase step height and length. Pt reports she feels she does not take big enough steps, possibly secondary to LE decreases in strength seen on eval.  PT has added DHI goal. The pt will benefit from further skilled therapy to improve balance and BLE strength to increase safety with all functional mobility.    Personal Factors and Comorbidities Age;Comorbidity 1;Comorbidity 2;Past/Current Experience;Sex;Social Background;Behavior Pattern;Comorbidity 3+    Comorbidities CVA in Jan 2022, anxiety, hypothyroidism, depression, OA, osteoporosis, memory change, migraine, seizures, hearing loss.  Examination-Activity Limitations Bathing;Stairs;Stand;Hygiene/Grooming;Locomotion Level;Carry;Bed Mobility;Other;Reach Overhead;Lift   bed mobility difficulty d/t pt  reports of high mattress; reported reaching overhead d/t balance (via ABC scale)   Examination-Participation Restrictions Community Activity;Yard Work;Shop;Cleaning;Laundry;Medication Management;Meal Prep    Stability/Clinical Decision Making Evolving/Moderate complexity    Rehab Potential Good    PT Frequency 2x / week    PT Duration 12 weeks    PT Treatment/Interventions ADLs/Self Care Home Management;Biofeedback;Canalith Repostioning;Cryotherapy;Electrical Stimulation;Aquatic Therapy;Moist Heat;Ultrasound;DME Instruction;Gait training;Stair training;Functional mobility training;Therapeutic activities;Therapeutic exercise;Balance training;Neuromuscular re-education;Cognitive remediation;Patient/family education;Orthotic Fit/Training;Manual techniques;Passive range of motion;Energy conservation;Taping;Splinting;Vestibular;Visual/perceptual remediation/compensation;Joint Manipulations    PT Next Visit Plan initiate HEP, higher level balance assessment, DHI, dynamic balance    PT Home Exercise Plan to be initiated next session    Consulted and Agree with Plan of Care Patient           Patient will benefit from skilled therapeutic intervention in order to improve the following deficits and impairments:  Abnormal gait,Decreased balance,Decreased endurance,Difficulty walking,Decreased range of motion,Dizziness,Improper body mechanics,Impaired vision/preception,Decreased coordination,Decreased strength,Decreased mobility,Obesity,Decreased activity tolerance,Decreased knowledge of use of DME,Impaired flexibility,Postural dysfunction  Visit Diagnosis: Unsteadiness on feet  Other abnormalities of gait and mobility  Muscle weakness (generalized)     Problem List Patient Active Problem List   Diagnosis Date Noted  . Acute ITP ([place]) 05/02/2020  . Other intra-abdominal and pelvic swelling, mass and lump 04/26/2020  . Pelvic mass in female 04/25/2020  . Ovarian cancer screening 04/25/2020  .  Ovarian mass, left 04/25/2020  . Stroke (cerebrum) ([place]) 04/20/2020  . Acute CVA (cerebrovascular accident) ([place]) 04/19/2020  . CVA (cerebral vascular accident) ([place]) 04/19/2020  . History of ischemic multifocal posterior circulation stroke 02/29/2020  . Thrombocytopenia ([place]) 02/28/2020  . HPV test positive 07/08/2015  . Difficulty hearing 06/28/2015  . Arthritis, degenerative 06/28/2015  . Obesity, Class I, BMI 30-34.9 06/28/2015  . Paresthesia 06/28/2015  . Purpura, nonthrombopenic ([place]) 06/28/2015  . Other specified hypothyroidism 08/09/2014  . Migraine without aura and without status migrainosus, not intractable 09/19/2008  . Moderate major depression ([place]) 09/07/2006  . OP (osteoporosis) 09/07/2006   Ricard Dillon PT, DPT 05/30/2020, 4:03 PM  [place] MAIN [facility] SERVICES [address] [place], [place], [address] Phone: [phone]   Fax:  [phone]  Name: REONNA FINLAYSON MRN: [id number] Date of Birth: Mar 20, 1950

## 2020-05-30 NOTE — Progress Notes (Signed)
Natalie [name]  Natalie Sizer, MD  ASSESSMENT & PLAN:  Thrombocytopenia ([place]) The most likely cause of her recent acute ITP is immune related illness With prednisone therapy, her thrombocytopenia has resolved She tolerated prednisone taper well She will stop treatment this week and I plan to see her again next month for further follow-up The patient is educated to watch out for signs and symptoms of bleeding  Acute CVA (cerebrovascular accident) [facility]) She is recovering well from her stroke except for balance issues She will continue physical therapy and rehab She is currently on dual anticoagulation therapy with antiplatelet agent She bruises quite a bit, likely exacerbated by prednisone therapy and fish oil use We will stop prednisone this week I recommend against taking fish oil now I am hopeful she can get off Plavix in the near future but would defer the plan to her neurologist  Pelvic mass in female She was seen and evaluated by Dr. Berline Lopes recently for pelvic mass She is scheduled for repeat ultrasound of her pelvis in July I will keep an eye on the result and determine the next step   No orders of the defined types were placed in this encounter.   The total time spent in the appointment was 20 minutes encounter with patients including review of chart and various tests results, discussions about plan of care and coordination of care plan   All questions were answered. The patient knows to call the clinic with any problems, questions or concerns. No barriers to learning was detected.    Natalie Lark, MD 4/27/202210:30 AM  INTERVAL HISTORY: Natalie [name] 70 y.o. female returns for further follow-up She returns with her daughter She is doing well except for increased bruises The patient denies any recent signs or symptoms of bleeding such as spontaneous epistaxis, hematuria or hematochezia. She is doing well with prednisone taper and  is down to 1 pill this week She has no focal neurological deficit but does have balance issues She is seeing therapist in the outpatient neurology physical therapy and rehab She denies recent falls  SUMMARY OF HEMATOLOGIC HISTORY: Natalie [name] 70 y.o. female is seen in the clinic today as a hospital follow-up Please see my consult [name] dated April 23, 2020 for further details  She presented with difficulty speaking and diplopia in this hospital stay.  She was also having difficulty thinking.  A code stroke was called.  CT head showed no acute intracranial hemorrhage and no definite acute infarction.  CTA head and neck showed nearly occlusive thrombus at the bifurcation of a proximal M2 MCA branch approximately 6 mm from the MCA bifurcation, subsequent occlusion of a proximal left M3 MCA branch, no stenosis in the neck.  MRI brain without contrast showed patchy small volume acute ischemic nonhemorrhagic posterior left MCA distribution infarcts likely reflecting embolic shower.   She was noted to have thrombocytopenia on admission with a platelet count of 93,000. Platelet count slowly drifting down this admission 93>77>68>77>66>59.  The most recent CBC available to me performed prior to this admission was done on 02/28/2020 and at that time, her platelet count was 108,000.  The patient is receiving several antiplatelet and anticoagulant medications including aspirin 81 mg daily, Plavix 75 mg daily, Lovenox 40 mg daily.  Review of her chart show that the aspirin and Plavix were started at the time of her initial CVA in January 2022.  She tells me that she was off the Plavix for short  period time but recently restarted on it just prior to this admission.  The patient underwent TEE earlier today.  Report reviewed and there was no source of embolus but moderate LAE is suggestive of possible PAF.  Feeling better overall.  She is not having any diplopia today.  Her speech is improving but she still has  some slurred speech and difficulty with word finding.  She currently denies bleeding.  She is not having any fevers, chills, headaches, chest pain, shortness of breath, abdominal pain, nausea, vomiting. Hematology was asked to see the patient make recommendations regarding her thrombocytopenia. In the hospital, she had extensive evaluation Serum vitamin K74 and folic acid level were normal CT imaging of abdomen and pelvis were performed to rule out splenomegaly and that revealed pelvic mass The patient did not receive platelet transfusion while hospitalized She was discharged home on medical management  Since she left the hospital, she feels fine The patient denies any recent signs or symptoms of bleeding such as spontaneous epistaxis, hematuria or hematochezia. On 05/03/2020, she was started on daily prednisone 60 mg for ITP On 05/10/2020, the dose of prednisone is reduced to 40 mg taper course On 05/17/20, she is recommended to continue prednisone taper On May 30, 2020, prednisone is discontinued  I have reviewed the past medical history, past surgical history, social history and family history with the patient and they are unchanged from previous [name].  ALLERGIES:  is allergic to sulfamethoxazole-trimethoprim.  MEDICATIONS:  Current Outpatient Medications  Medication Sig Dispense Refill  . atorvastatin (LIPITOR) 40 MG tablet Take 1 tablet (40 mg total) by mouth daily. 90 tablet 1  . buPROPion (WELLBUTRIN SR) 150 MG 12 hr tablet Take 150 mg by mouth 2 (two) times daily.    . citalopram (CELEXA) 40 MG tablet Take 40 mg by mouth daily.    . clindamycin-benzoyl peroxide (BENZACLIN) gel Apply topically 2 (two) times daily. (Patient taking differently: Apply 1 application topically 2 (two) times daily as needed (facial acne).) 50 g 0  . clopidogrel (PLAVIX) 75 MG tablet Take 1 tablet (75 mg total) by mouth daily. 30 tablet 1  . Cyanocobalamin (VITAMIN B-12) 1000 MCG SUBL Place 1,000 mcg under  the tongue at bedtime.    Marland Kitchen levothyroxine (SYNTHROID) 75 MCG tablet Take 1 tablet (75 mcg total) by mouth daily before breakfast. 90 tablet 1  . Multiple Vitamin (MULTIVITAMIN WITH MINERALS) TABS tablet Take 1 tablet by mouth daily.    . polyvinyl alcohol (LIQUIFILM TEARS) 1.4 % ophthalmic solution Place 1 drop into both eyes daily as needed for dry eyes.    Marland Kitchen topiramate (TOPAMAX) 25 MG tablet Take 25 mg by mouth daily.     . Vitamin D, Ergocalciferol, (DRISDOL) 1.25 MG (50000 UNIT) CAPS capsule TAKE 1 CAPSULE BY MOUTH EVERY 7 DAYS (Patient taking differently: Take 50,000 Units by mouth every Tuesday.) 12 capsule 1   No current facility-administered medications for this visit.     REVIEW OF SYSTEMS:   Constitutional: Denies fevers, chills or night sweats Eyes: Denies blurriness of vision Ears, [name], mouth, throat, and face: Denies mucositis or sore throat Respiratory: Denies cough, dyspnea or wheezes Cardiovascular: Denies palpitation, chest discomfort or lower extremity swelling Gastrointestinal:  Denies nausea, heartburn or change in bowel habits Skin: Denies abnormal skin rashes Lymphatics: Denies new lymphadenopathy Neurological:Denies numbness, tingling or new weaknesses Behavioral/Psych: Mood is stable, no new changes  All other systems were reviewed with the patient and are negative.  PHYSICAL EXAMINATION: ECOG  PERFORMANCE STATUS: 1 - Symptomatic but completely ambulatory  Vitals:   05/30/20 1001  BP: 101/60  Pulse: 73  Resp: 15  Temp: 97.8 F (36.6 C)  SpO2: 100%   Filed Weights   05/30/20 1001  Weight: 201 lb 3.2 oz (91.3 kg)    GENERAL:alert, no distress and comfortable SKIN: Noted skin bruises  NEURO: alert & oriented x 3 with fluent speech, no focal motor/sensory deficits  LABORATORY DATA:  I have reviewed the data as listed     Component Value Date/Time   NA 137 04/24/2020 0412   NA 140 06/28/2015 1600   K 3.4 (L) 04/24/2020 0412   CL 105 04/24/2020  0412   CO2 25 04/24/2020 0412   GLUCOSE 101 (H) 04/24/2020 0412   BUN 16 04/24/2020 0412   BUN 12 06/28/2015 1600   CREATININE 0.90 04/24/2020 0412   CREATININE 0.94 01/25/2020 0820   CALCIUM 8.9 04/24/2020 0412   PROT 5.8 (L) 04/24/2020 0412   PROT 6.9 06/28/2015 1600   ALBUMIN 3.1 (L) 04/24/2020 0412   ALBUMIN 4.3 06/28/2015 1600   AST 34 04/24/2020 0412   ALT 27 04/24/2020 0412   ALKPHOS 128 (H) 04/24/2020 0412   BILITOT 0.8 04/24/2020 0412   BILITOT 0.6 06/28/2015 1600   GFRNONAA >60 04/24/2020 0412   GFRNONAA 62 01/25/2020 0820   GFRAA 72 01/25/2020 0820    No results found for: SPEP, UPEP  Lab Results  Component Value Date   WBC 6.1 05/30/2020   NEUTROABS 3.7 05/30/2020   HGB 13.2 05/30/2020   HCT 41.5 05/30/2020   MCV 97.9 05/30/2020   PLT 202 05/30/2020      Chemistry      Component Value Date/Time   NA 137 04/24/2020 0412   NA 140 06/28/2015 1600   K 3.4 (L) 04/24/2020 0412   CL 105 04/24/2020 0412   CO2 25 04/24/2020 0412   BUN 16 04/24/2020 0412   BUN 12 06/28/2015 1600   CREATININE 0.90 04/24/2020 0412   CREATININE 0.94 01/25/2020 0820      Component Value Date/Time   CALCIUM 8.9 04/24/2020 0412   ALKPHOS 128 (H) 04/24/2020 0412   AST 34 04/24/2020 0412   ALT 27 04/24/2020 0412   BILITOT 0.8 04/24/2020 0412   BILITOT 0.6 06/28/2015 1600

## 2020-06-04 DIAGNOSIS — F330 Major depressive disorder, recurrent, mild: Secondary | ICD-10-CM | POA: Diagnosis not present

## 2020-06-06 DIAGNOSIS — Z8669 Personal history of other diseases of the nervous system and sense organs: Secondary | ICD-10-CM | POA: Diagnosis not present

## 2020-06-06 DIAGNOSIS — Z8673 Personal history of transient ischemic attack (TIA), and cerebral infarction without residual deficits: Secondary | ICD-10-CM | POA: Diagnosis not present

## 2020-06-07 ENCOUNTER — Ambulatory Visit: Payer: Medicare Other

## 2020-06-14 ENCOUNTER — Ambulatory Visit: Payer: Medicare Other | Attending: Family Medicine

## 2020-06-14 ENCOUNTER — Other Ambulatory Visit: Payer: Self-pay

## 2020-06-14 DIAGNOSIS — R2689 Other abnormalities of gait and mobility: Secondary | ICD-10-CM | POA: Diagnosis not present

## 2020-06-14 DIAGNOSIS — M6281 Muscle weakness (generalized): Secondary | ICD-10-CM | POA: Insufficient documentation

## 2020-06-14 DIAGNOSIS — R2681 Unsteadiness on feet: Secondary | ICD-10-CM

## 2020-06-14 DIAGNOSIS — R278 Other lack of coordination: Secondary | ICD-10-CM | POA: Insufficient documentation

## 2020-06-14 NOTE — Progress Notes (Signed)
[facility] Summary Report / Loop Recorder 

## 2020-06-15 NOTE — Therapy (Signed)
[place] MAIN [facility] SERVICES [address] [place], [place], [address] Phone: [phone]   Fax:  [phone]  Physical Therapy Treatment  Patient Details  Name: Natalie [name] MRN: [id number] Date of Birth: 09-05-1950 Referring Provider (PT): [name], MD   Encounter Date: 06/14/2020   PT End of Session - 06/15/20 0801    Visit Number 3    Number of Visits 25    Date for PT Re-Evaluation 08/13/20    Authorization Time Period eval performed 05/21/2020    PT Start Time 1300    PT Stop Time 1343    PT Time Calculation (min) 43 min    Equipment Utilized During Treatment Gait belt    Activity Tolerance Patient tolerated treatment well    Behavior During Therapy [facility] for tasks assessed/performed           Past Medical History:  Diagnosis Date  . Acute bronchospasm due to viral infection   . Anemia    history of  . CVA (cerebral vascular accident) ([place]) 02/28/2020  . Depressive disorder   . Epistaxis   . Fatigue   . Hearing loss   . Hypothyroidism   . Memory change   . Migraine   . Numbness and tingling   . Obesity (BMI 30.0-34.9)   . Osteoarthritis   . Osteoporosis   . Polyuria   . Premature menopause   . Seizures ([place])    history of mini seizures, possible migraine induced    Past Surgical History:  Procedure Laterality Date  . ABLATION  2006  . BUBBLE STUDY  04/23/2020   Procedure: BUBBLE STUDY;  Surgeon: Larey Dresser, MD;  Location: [facility] ENDOSCOPY;  Service: Cardiovascular;;  . CATARACT EXTRACTION W/ INTRAOCULAR LENS IMPLANT Bilateral   . COLONOSCOPY    . COLONOSCOPY WITH PROPOFOL N/A 12/22/2018   Procedure: COLONOSCOPY WITH PROPOFOL;  Surgeon: Virgel Manifold, MD;  Location: [facility] ENDOSCOPY;  Service: Endoscopy;  Laterality: N/A;  . EYE SURGERY  Spring 2018   Cataracts both eyes  . LOOP RECORDER INSERTION N/A 04/23/2020   Procedure: LOOP RECORDER INSERTION;  Surgeon: Evans Lance, MD;  Location: [facility] CV LAB;  Service: Cardiovascular;  Laterality: N/A;  . TEE WITHOUT CARDIOVERSION N/A 04/23/2020   Procedure: TRANSESOPHAGEAL ECHOCARDIOGRAM (TEE);  Surgeon: Larey Dresser, MD;  Location: [facility] ENDOSCOPY;  Service: Cardiovascular;  Laterality: N/A;  . TONSILLECTOMY      There were no vitals filed for this visit.   Subjective Assessment - 06/14/20 1301    Subjective Pt reports she missed last session due to migraine. Pt reports she had migraine yesterday. She says her migraines have been really impacting her. She is going to discuss this with her doctor. She reports she feels OK at the moment.    Pertinent History Pt confirms the following hx is accurate (initially from acute care PT eval): "the pt is a 70 yo female presenting 3/17 with confusion and difficulty word-finding. Imaging revealed L MCA infarcts with nearly occlusive thrombus in proximal M2 and M3." PMH includes the following: CVA in Jan 2022 (for further details see ACUTE PT eval in EMR from 02/29/2020), anxiety, hypothyroidism, depression, OA, osteoporosis, memory change, migraine, seizures, hearing loss.    Limitations Standing;Walking;Lifting;House hold activities;Writing   she says she limits how heavy she lifts due to hesitation   How long can you sit comfortably? not limited    How long can you stand comfortably? Pt reports she doesn't  like to stand still for very long    How long can you walk comfortably? Difficulty with balance when walking over uneven ground, but states she is able to walk a mile. She says sometimes she gets tired walking and will sit down. She feels overall she's been fairly energetic.    Diagnostic tests per chart: "[place] 04/19/20: MR BRAIN IMPRESSION:  1. Patchy small volume acute ischemic nonhemorrhagic posterior left  MCA distribution infarcts, likely reflecting an embolic shower. No  associated hemorrhage or mass effect.  2. No other acute intracranial abnormality.  3. Underlying mild chronic  microvascular ischemic disease.; 3/17 CT ANGIO HEAD NECK:  IMPRESSION:  Nearly occlusive thrombus at the bifurcation of a proximal M2 MCA  branch approximately 6 mm from the MCA bifurcation. Subsequent  occlusion of a proximal left M3 MCA branch. No stenosis in the neck."    Patient Stated Goals Pt says she wants to improve her balance and does not want to fall again    Currently in Pain? No/denies          TREATMENT  Neuro Re-education: CGA provided for all the following exercises  SLB at support surface - 4x30 sec each LE; close CGA, increased sway  Stepping over orange hurdle forward/backward - x multiple reps Rest break  Standing hedgehog taps where PT calls out which color hedgehog to tap - 3x15 each LE forward/contralat.   Flat top bosu ankle strategies forward/backward, CW/CC - 10-15x for each direction.  Prolonged seated rest break  Tandem stance at bar 30 sec each LE. Pt exhibits minor increase in sway.  Korebalance: Tux racer, emphasis on limits of stability, coordination, ankle strategies- 2x with BUE support. Discontinued due to visual disturbance that pt reports she has had since her stroke, where pt says disturbance is increased with looking at bright lights Prolonged seated rest break  Vitals assessed: SPO2 98%, HR range in 70s bpm.   BP: 129/80 mmHg, HR 78 bpm  Assessment: Pt challenged with all balance exercises performed today. Pt session overall slightly limited due to pt not feeling well, as pt has recently had migraine and missed previous session due to migraine. She also reported during session she had not had anything to eat yet today and was not feeling good. Pt vitals assessed: SPO2 98%, HR range in 70s, BP 129/80 mmHg. PT reinforced discussing BP drop seen in last session with her doctor as she has not yet brought it up with her doctor. Pt verbalized understanding and also acknowledges she should eat something before PT. Pt was accompanied back to her car by  tech. PT also called pt later in the day and pt confirmed she was home and feeling better. The pt will benefit from further skilled PT to address deficits in order to improve balance and decrease fall risk.    Education provided throughout session in the form of VC/TC/Demo to facilitate correct muscle activation and movement at target joints with exercises. Pt exhibits good carryover within session after cuing.      PT Education - 06/15/20 0800    Education Details exercise technique, body mechanics    Person(s) Educated Patient    Methods Explanation;Demonstration;Verbal cues    Comprehension Verbalized understanding;Returned demonstration            PT Short Term Goals - 05/21/20 1639      PT SHORT TERM GOAL #1   Title Pt will be independent with HEP in order to improve strength and balance in order  to decrease fall risk and improve function at home and work.    Baseline 4/18: to be initiated    Time 6    Period Weeks    Status New    Target Date 07/02/20             PT Long Term Goals - 05/30/20 1553      PT LONG TERM GOAL #1   Title Pt will improve DGI by at least 3 points in order to demonstrate clinically significant improvement in balance and decreased risk for falls.    Baseline 4/18: 18/24    Time 12    Period Weeks    Status New    Target Date 08/13/20      PT LONG TERM GOAL #2   Title Pt will improve ABC scale by at least 13% in order to demonstrate clinically significant improvement in balance confidence.    Baseline 4/18: 63.75%    Time 12    Period Weeks    Status New    Target Date 08/13/20      PT LONG TERM GOAL #3   Title Pt will demonstrate improved ability to maintain balance on compliant surface with EC for at least 5 seconds to indicate improved ability to navigate uneven surfaces and ambulate in dimly lit rooms.    Baseline 4/18: Pt unable to maintain balance with EC on foam on CTSIB-M    Time 12    Period Weeks    Status New    Target Date  08/13/20      PT LONG TERM GOAL #4   Title Patient will increase BLE gross strength to 4+/5 as to improve functional strength for independent gait, increased standing tolerance and increased ADL ability.    Baseline 4/18: gross BLE strength currnelty 4/5    Time 12    Period Weeks    Status New    Target Date 08/13/20      PT LONG TERM GOAL #5   Title Patient will increase FOTO score to equal to or greater than 80 to demonstrate improvement in mobility and quality of life.    Baseline 4/18: 74%    Time 12    Period Weeks    Status New    Target Date 08/13/20      PT LONG TERM GOAL #6   Title Patient will reduce dizziness handicap inventory score to <10, for less dizziness with ADLs and increased safety with home and work tasks.    Baseline 4/27: DHI score is 16, indicating mild handicap    Time 11    Period Weeks    Status New    Target Date 08/13/20                 Plan - 06/15/20 0802    Clinical Impression Statement Pt challenged with all balance exercises performed today. Pt session overall slightly limited due to pt not feeling well, as pt has recently had migraine and missed previous session due to migraine. She also reported during session she had not had anything to eat yet today and was not feeling good. Pt vitals assessed: SPO2 98%, HR range in 70s, BP 129/80 mmHg. PT reinforced discussing BP drop seen in last session with her doctor as she has not yet brought it up with her doctor. Pt verbalized understanding and also acknowledges she should eat something before PT. Pt was accompanied back to her car by tech. PT also called pt later in the  day and pt confirmed she was home and feeling better. The pt will benefit from further skilled PT to address deficits in order to improve balance and decrease fall risk.    Personal Factors and Comorbidities Age;Comorbidity 1;Comorbidity 2;Past/Current Experience;Sex;Social Background;Behavior Pattern;Comorbidity 3+    Comorbidities  CVA in Jan 2022, anxiety, hypothyroidism, depression, OA, osteoporosis, memory change, migraine, seizures, hearing loss.    Examination-Activity Limitations Bathing;Stairs;Stand;Hygiene/Grooming;Locomotion Level;Carry;Bed Mobility;Other;Reach Overhead;Lift   bed mobility difficulty d/t pt reports of high mattress; reported reaching overhead d/t balance (via ABC scale)   Examination-Participation Restrictions Community Activity;Yard Work;Shop;Cleaning;Laundry;Medication Management;Meal Prep    Stability/Clinical Decision Making Evolving/Moderate complexity    Rehab Potential Good    PT Frequency 2x / week    PT Duration 12 weeks    PT Treatment/Interventions ADLs/Self Care Home Management;Biofeedback;Canalith Repostioning;Cryotherapy;Electrical Stimulation;Aquatic Therapy;Moist Heat;Ultrasound;DME Instruction;Gait training;Stair training;Functional mobility training;Therapeutic activities;Therapeutic exercise;Balance training;Neuromuscular re-education;Cognitive remediation;Patient/family education;Orthotic Fit/Training;Manual techniques;Passive range of motion;Energy conservation;Taping;Splinting;Vestibular;Visual/perceptual remediation/compensation;Joint Manipulations    PT Next Visit Plan initiate HEP, higher level balance assessment, DHI, dynamic balance, continue POC as previously indicated    PT Home Exercise Plan to be initiated next session    Consulted and Agree with Plan of Care Patient           Patient will benefit from skilled therapeutic intervention in order to improve the following deficits and impairments:  Abnormal gait,Decreased balance,Decreased endurance,Difficulty walking,Decreased range of motion,Dizziness,Improper body mechanics,Impaired vision/preception,Decreased coordination,Decreased strength,Decreased mobility,Obesity,Decreased activity tolerance,Decreased knowledge of use of DME,Impaired flexibility,Postural dysfunction  Visit Diagnosis: Unsteadiness on feet  Other  abnormalities of gait and mobility     Problem List Patient Active Problem List   Diagnosis Date Noted  . Acute ITP ([place]) 05/02/2020  . Other intra-abdominal and pelvic swelling, mass and lump 04/26/2020  . Pelvic mass in female 04/25/2020  . Ovarian cancer screening 04/25/2020  . Ovarian mass, left 04/25/2020  . Stroke (cerebrum) ([place]) 04/20/2020  . Acute CVA (cerebrovascular accident) ([place]) 04/19/2020  . CVA (cerebral vascular accident) ([place]) 04/19/2020  . History of ischemic multifocal posterior circulation stroke 02/29/2020  . Thrombocytopenia ([place]) 02/28/2020  . HPV test positive 07/08/2015  . Difficulty hearing 06/28/2015  . Arthritis, degenerative 06/28/2015  . Obesity, Class I, BMI 30-34.9 06/28/2015  . Paresthesia 06/28/2015  . Purpura, nonthrombopenic ([place]) 06/28/2015  . Other specified hypothyroidism 08/09/2014  . Migraine without aura and without status migrainosus, not intractable 09/19/2008  . Moderate major depression ([place]) 09/07/2006  . OP (osteoporosis) 09/07/2006   Ricard Dillon PT, DPT 06/15/2020, 8:17 AM  [place] MAIN [facility] SERVICES [address] [place], [place], [address] Phone: [phone]   Fax:  [phone]  Name: Natalie [name] MRN: [id number] Date of Birth: 1950/05/17

## 2020-06-21 DIAGNOSIS — I6340 Cerebral infarction due to embolism of unspecified cerebral artery: Secondary | ICD-10-CM | POA: Diagnosis not present

## 2020-06-21 DIAGNOSIS — E785 Hyperlipidemia, unspecified: Secondary | ICD-10-CM | POA: Diagnosis not present

## 2020-06-21 DIAGNOSIS — R0602 Shortness of breath: Secondary | ICD-10-CM | POA: Diagnosis not present

## 2020-06-21 DIAGNOSIS — E669 Obesity, unspecified: Secondary | ICD-10-CM | POA: Diagnosis not present

## 2020-06-21 DIAGNOSIS — Z87898 Personal history of other specified conditions: Secondary | ICD-10-CM | POA: Diagnosis not present

## 2020-06-21 DIAGNOSIS — R609 Edema, unspecified: Secondary | ICD-10-CM | POA: Diagnosis not present

## 2020-06-21 DIAGNOSIS — I480 Paroxysmal atrial fibrillation: Secondary | ICD-10-CM | POA: Diagnosis not present

## 2020-06-21 DIAGNOSIS — I10 Essential (primary) hypertension: Secondary | ICD-10-CM | POA: Diagnosis not present

## 2020-06-21 DIAGNOSIS — R002 Palpitations: Secondary | ICD-10-CM | POA: Diagnosis not present

## 2020-06-21 DIAGNOSIS — I208 Other forms of angina pectoris: Secondary | ICD-10-CM | POA: Diagnosis not present

## 2020-06-25 ENCOUNTER — Inpatient Hospital Stay: Payer: Medicare Other

## 2020-06-25 ENCOUNTER — Other Ambulatory Visit: Payer: Self-pay

## 2020-06-25 ENCOUNTER — Encounter: Payer: Self-pay | Admitting: Hematology and Oncology

## 2020-06-25 ENCOUNTER — Inpatient Hospital Stay: Payer: Medicare Other | Attending: Hematology and Oncology | Admitting: Hematology and Oncology

## 2020-06-25 DIAGNOSIS — Z7901 Long term (current) use of anticoagulants: Secondary | ICD-10-CM | POA: Insufficient documentation

## 2020-06-25 DIAGNOSIS — N838 Other noninflammatory disorders of ovary, fallopian tube and broad ligament: Secondary | ICD-10-CM | POA: Diagnosis not present

## 2020-06-25 DIAGNOSIS — N839 Noninflammatory disorder of ovary, fallopian tube and broad ligament, unspecified: Secondary | ICD-10-CM | POA: Diagnosis not present

## 2020-06-25 DIAGNOSIS — Z8673 Personal history of transient ischemic attack (TIA), and cerebral infarction without residual deficits: Secondary | ICD-10-CM | POA: Insufficient documentation

## 2020-06-25 DIAGNOSIS — D696 Thrombocytopenia, unspecified: Secondary | ICD-10-CM

## 2020-06-25 DIAGNOSIS — Z1273 Encounter for screening for malignant neoplasm of ovary: Secondary | ICD-10-CM

## 2020-06-25 DIAGNOSIS — R1900 Intra-abdominal and pelvic swelling, mass and lump, unspecified site: Secondary | ICD-10-CM

## 2020-06-25 LAB — CBC WITH DIFFERENTIAL/PLATELET
Abs Immature Granulocytes: 0.01 10*3/uL (ref 0.00–0.07)
Basophils Absolute: 0 10*3/uL (ref 0.0–0.1)
Basophils Relative: 1 %
Eosinophils Absolute: 0.2 10*3/uL (ref 0.0–0.5)
Eosinophils Relative: 4 %
HCT: 39.4 % (ref 36.0–46.0)
Hemoglobin: 12.8 g/dL (ref 12.0–15.0)
Immature Granulocytes: 0 %
Lymphocytes Relative: 29 %
Lymphs Abs: 1.7 10*3/uL (ref 0.7–4.0)
MCH: 31.1 pg (ref 26.0–34.0)
MCHC: 32.5 g/dL (ref 30.0–36.0)
MCV: 95.6 fL (ref 80.0–100.0)
Monocytes Absolute: 0.4 10*3/uL (ref 0.1–1.0)
Monocytes Relative: 6 %
Neutro Abs: 3.5 10*3/uL (ref 1.7–7.7)
Neutrophils Relative %: 60 %
Platelets: 209 10*3/uL (ref 150–400)
RBC: 4.12 MIL/uL (ref 3.87–5.11)
RDW: 12.2 % (ref 11.5–15.5)
WBC: 5.8 10*3/uL (ref 4.0–10.5)
nRBC: 0 % (ref 0.0–0.2)

## 2020-06-25 NOTE — Assessment & Plan Note (Signed)
She was seen and evaluated by Dr. Berline Lopes recently for pelvic mass She is scheduled for repeat ultrasound of her pelvis in July I will keep an eye on the result and determine the next step

## 2020-06-25 NOTE — Progress Notes (Signed)
[name] OFFICE PROGRESS NOTE  [name], MD  ASSESSMENT & PLAN:  Thrombocytopenia ([place]) The cause is unknown, likely due to ITP It has resolved and has not recurred since discontinuation of prednisone a month ago She will continue antiplatelet and anticoagulation therapy indefinitely She does not need future follow-up  Ovarian mass, left She was seen and evaluated by Dr. Berline Lopes recently for pelvic mass She is scheduled for repeat ultrasound of her pelvis in July I will keep an eye on the result and determine the next step   No orders of the defined types were placed in this encounter.   The total time spent in the appointment was 15 minutes encounter with patients including review of chart and various tests results, discussions about plan of care and coordination of care plan   All questions were answered. The patient knows to call the clinic with any problems, questions or concerns. No barriers to learning was detected.    [name], MD 5/23/202212:30 PM  INTERVAL HISTORY: Natalie [name] 70 y.o. female returns for follow-up on ITP and pelvic mass She is doing well Her anticoagulation therapy was added due to possible A. fib as a cause of her recurrent stroke She has no bleeding complications Denies abdominal pain no changes in bowel habits  SUMMARY OF HEMATOLOGIC HISTORY: Natalie [name] 70 y.o. female is seen in the clinic today as a hospital follow-up Please see my consult note dated April 23, 2020 for further details  She presented with difficulty speaking and diplopia in this hospital stay.  She was also having difficulty thinking.  A code stroke was called.  CT head showed no acute intracranial hemorrhage and no definite acute infarction.  CTA head and neck showed nearly occlusive thrombus at the bifurcation of a proximal M2 MCA branch approximately 6 mm from the MCA bifurcation, subsequent occlusion of a proximal left M3 MCA branch, no  stenosis in the neck.  MRI brain without contrast showed patchy small volume acute ischemic nonhemorrhagic posterior left MCA distribution infarcts likely reflecting embolic shower.   She was noted to have thrombocytopenia on admission with a platelet count of 93,000. Platelet count slowly drifting down this admission 93>77>68>77>66>59.  The most recent CBC available to me performed prior to this admission was done on 02/28/2020 and at that time, her platelet count was 108,000.  The patient is receiving several antiplatelet and anticoagulant medications including aspirin 81 mg daily, Plavix 75 mg daily, Lovenox 40 mg daily.  Review of her chart show that the aspirin and Plavix were started at the time of her initial CVA in January 2022.  She tells me that she was off the Plavix for short period time but recently restarted on it just prior to this admission.  The patient underwent TEE earlier today.  Report reviewed and there was no source of embolus but moderate LAE is suggestive of possible PAF.  Feeling better overall.  She is not having any diplopia today.  Her speech is improving but she still has some slurred speech and difficulty with word finding.  She currently denies bleeding.  She is not having any fevers, chills, headaches, chest pain, shortness of breath, abdominal pain, nausea, vomiting. Hematology was asked to see the patient make recommendations regarding her thrombocytopenia. In the hospital, she had extensive evaluation Serum vitamin C78 and folic acid level were normal CT imaging of abdomen and pelvis were performed to rule out splenomegaly and that revealed pelvic mass The patient  did not receive platelet transfusion while hospitalized She was discharged home on medical management  Since she left the hospital, she feels fine The patient denies any recent signs or symptoms of bleeding such as spontaneous epistaxis, hematuria or hematochezia. On 05/03/2020, she was started on daily  prednisone 60 mg for ITP On 05/10/2020, the dose of prednisone is reduced to 40 mg taper course On 05/17/20, she is recommended to continue prednisone taper On May 30, 2020, prednisone is discontinued   I have reviewed the past medical history, past surgical history, social history and family history with the patient and they are unchanged from previous note.  ALLERGIES:  is allergic to sulfamethoxazole-trimethoprim.  MEDICATIONS:  Current Outpatient Medications  Medication Sig Dispense Refill  . rivaroxaban (XARELTO) 20 MG TABS tablet Take 20 mg by mouth daily with supper.    Marland Kitchen atorvastatin (LIPITOR) 40 MG tablet Take 1 tablet (40 mg total) by mouth daily. 90 tablet 1  . buPROPion (WELLBUTRIN SR) 150 MG 12 hr tablet Take 150 mg by mouth 2 (two) times daily.    . citalopram (CELEXA) 40 MG tablet Take 40 mg by mouth daily.    . clindamycin-benzoyl peroxide (BENZACLIN) gel Apply topically 2 (two) times daily. (Patient taking differently: Apply 1 application topically 2 (two) times daily as needed (facial acne).) 50 g 0  . clopidogrel (PLAVIX) 75 MG tablet Take 1 tablet (75 mg total) by mouth daily. 30 tablet 1  . Cyanocobalamin (VITAMIN B-12) 1000 MCG SUBL Place 1,000 mcg under the tongue at bedtime.    Marland Kitchen levothyroxine (SYNTHROID) 75 MCG tablet Take 1 tablet (75 mcg total) by mouth daily before breakfast. 90 tablet 1  . Multiple Vitamin (MULTIVITAMIN WITH MINERALS) TABS tablet Take 1 tablet by mouth daily.    . polyvinyl alcohol (LIQUIFILM TEARS) 1.4 % ophthalmic solution Place 1 drop into both eyes daily as needed for dry eyes.    Marland Kitchen topiramate (TOPAMAX) 25 MG tablet Take 25 mg by mouth daily.     . Vitamin D, Ergocalciferol, (DRISDOL) 1.25 MG (50000 UNIT) CAPS capsule TAKE 1 CAPSULE BY MOUTH EVERY 7 DAYS (Patient taking differently: Take 50,000 Units by mouth every Tuesday.) 12 capsule 1   No current facility-administered medications for this visit.     REVIEW OF SYSTEMS:    Constitutional: Denies fevers, chills or night sweats Eyes: Denies blurriness of vision Ears, [name], mouth, throat, and face: Denies mucositis or sore throat Respiratory: Denies cough, dyspnea or wheezes Cardiovascular: Denies palpitation, chest discomfort or lower extremity swelling Gastrointestinal:  Denies nausea, heartburn or change in bowel habits Skin: Denies abnormal skin rashes Lymphatics: Denies new lymphadenopathy or easy bruising Neurological:Denies numbness, tingling or new weaknesses Behavioral/Psych: Mood is stable, no new changes  All other systems were reviewed with the patient and are negative.  PHYSICAL EXAMINATION: ECOG PERFORMANCE STATUS: 0 - Asymptomatic  Vitals:   06/25/20 1149  BP: 113/80  Pulse: 76  Resp: 18  SpO2: 99%   Filed Weights   06/25/20 1149  Weight: 197 lb 9.6 oz (89.6 kg)    GENERAL:alert, no distress and comfortable NEURO: alert & oriented x 3 with fluent speech, no focal motor/sensory deficits  LABORATORY DATA:  I have reviewed the data as listed     Component Value Date/Time   NA 137 04/24/2020 0412   NA 140 06/28/2015 1600   K 3.4 (L) 04/24/2020 0412   CL 105 04/24/2020 0412   CO2 25 04/24/2020 0412   GLUCOSE 101 (H)  04/24/2020 0412   BUN 16 04/24/2020 0412   BUN 12 06/28/2015 1600   CREATININE 0.90 04/24/2020 0412   CREATININE 0.94 01/25/2020 0820   CALCIUM 8.9 04/24/2020 0412   PROT 5.8 (L) 04/24/2020 0412   PROT 6.9 06/28/2015 1600   ALBUMIN 3.1 (L) 04/24/2020 0412   ALBUMIN 4.3 06/28/2015 1600   AST 34 04/24/2020 0412   ALT 27 04/24/2020 0412   ALKPHOS 128 (H) 04/24/2020 0412   BILITOT 0.8 04/24/2020 0412   BILITOT 0.6 06/28/2015 1600   GFRNONAA >60 04/24/2020 0412   GFRNONAA 62 01/25/2020 0820   GFRAA 72 01/25/2020 0820    No results found for: SPEP, UPEP  Lab Results  Component Value Date   WBC 5.8 06/25/2020   NEUTROABS 3.5 06/25/2020   HGB 12.8 06/25/2020   HCT 39.4 06/25/2020   MCV 95.6 06/25/2020    PLT 209 06/25/2020      Chemistry      Component Value Date/Time   NA 137 04/24/2020 0412   NA 140 06/28/2015 1600   K 3.4 (L) 04/24/2020 0412   CL 105 04/24/2020 0412   CO2 25 04/24/2020 0412   BUN 16 04/24/2020 0412   BUN 12 06/28/2015 1600   CREATININE 0.90 04/24/2020 0412   CREATININE 0.94 01/25/2020 0820      Component Value Date/Time   CALCIUM 8.9 04/24/2020 0412   ALKPHOS 128 (H) 04/24/2020 0412   AST 34 04/24/2020 0412   ALT 27 04/24/2020 0412   BILITOT 0.8 04/24/2020 0412   BILITOT 0.6 06/28/2015 1600

## 2020-06-25 NOTE — Assessment & Plan Note (Signed)
The cause is unknown, likely due to ITP It has resolved and has not recurred since discontinuation of prednisone a month ago She will continue antiplatelet and anticoagulation therapy indefinitely She does not need future follow-up

## 2020-06-26 ENCOUNTER — Ambulatory Visit: Payer: Medicare Other

## 2020-06-26 DIAGNOSIS — R2681 Unsteadiness on feet: Secondary | ICD-10-CM | POA: Diagnosis not present

## 2020-06-26 DIAGNOSIS — R278 Other lack of coordination: Secondary | ICD-10-CM | POA: Diagnosis not present

## 2020-06-26 DIAGNOSIS — M6281 Muscle weakness (generalized): Secondary | ICD-10-CM | POA: Diagnosis not present

## 2020-06-26 DIAGNOSIS — R2689 Other abnormalities of gait and mobility: Secondary | ICD-10-CM

## 2020-06-26 NOTE — Therapy (Signed)
[place] MAIN [facility] SERVICES [address] [place], [place], [address] Phone: [phone]   Fax:  [phone]  Physical Therapy Treatment  Patient Details  Name: Natalie [name] MRN: [id number] Date of Birth: 1950/06/22 Referring Provider (PT): [name], MD   Encounter Date: 06/26/2020   PT End of Session - 06/26/20 1752    Visit Number 4    Number of Visits 25    Date for PT Re-Evaluation 08/13/20    Authorization Time Period eval performed 05/21/2020    PT Start Time 1300    PT Stop Time 1344    PT Time Calculation (min) 44 min    Equipment Utilized During Treatment Gait belt    Activity Tolerance Patient tolerated treatment well    Behavior During Therapy [facility] for tasks assessed/performed           Past Medical History:  Diagnosis Date  . Acute bronchospasm due to viral infection   . Anemia    history of  . CVA (cerebral vascular accident) ([place]) 02/28/2020  . Depressive disorder   . Epistaxis   . Fatigue   . Hearing loss   . Hypothyroidism   . Memory change   . Migraine   . Numbness and tingling   . Obesity (BMI 30.0-34.9)   . Osteoarthritis   . Osteoporosis   . Polyuria   . Premature menopause   . Seizures ([place])    history of mini seizures, possible migraine induced    Past Surgical History:  Procedure Laterality Date  . ABLATION  2006  . BUBBLE STUDY  04/23/2020   Procedure: BUBBLE STUDY;  Surgeon: Larey Dresser, MD;  Location: [facility] ENDOSCOPY;  Service: Cardiovascular;;  . CATARACT EXTRACTION W/ INTRAOCULAR LENS IMPLANT Bilateral   . COLONOSCOPY    . COLONOSCOPY WITH PROPOFOL N/A 12/22/2018   Procedure: COLONOSCOPY WITH PROPOFOL;  Surgeon: Virgel Manifold, MD;  Location: [facility] ENDOSCOPY;  Service: Endoscopy;  Laterality: N/A;  . EYE SURGERY  Spring 2018   Cataracts both eyes  . LOOP RECORDER INSERTION N/A 04/23/2020   Procedure: LOOP RECORDER INSERTION;  Surgeon: Evans Lance, MD;  Location: [place] CV LAB;  Service: Cardiovascular;  Laterality: N/A;  . TEE WITHOUT CARDIOVERSION N/A 04/23/2020   Procedure: TRANSESOPHAGEAL ECHOCARDIOGRAM (TEE);  Surgeon: Larey Dresser, MD;  Location: [facility] ENDOSCOPY;  Service: Cardiovascular;  Laterality: N/A;  . TONSILLECTOMY      There were no vitals filed for this visit.  TREATMENT  Therex: STS Standing hip abduction Standing marches LAQ Heel raises Standing toe raises Seated toe raises Pt performed 15x for each, except for STS which pt performed 10x  Introduced UE strengthening to assist with pt reports of difficulty with floor transfers: Lat pulldown with 12.5# 3x12 at matrix Wall push-up 3x12  Neuro Re-education: CGA provided for all the following exercises  Standing on airex pad NBOS EO, EC 3x30 sec each  Standing on airex pad vertical head turns 2x30 sec   Obstacle course with cones and hurdle, hedgehog tap 10x; pt knocked over cones, and with poor foot clearance over hurdle    Access Code: [email or web address] URL: [email or web address] Date: 06/26/2020 Prepared by: Ricard Dillon  Exercises  . Sit to Stand - 1 x daily - 4 x weekly - 3 sets - 10 reps . Standing Hip Abduction with Counter Support - 1 x daily - 4 x weekly - 3 sets - 15 reps . Seated Heel Raise - 1 x  daily - 4 x weekly - 3 sets - 15 reps . Heel rises with counter support - 1 x daily - 4 x weekly - 3 sets - 15 reps . Standing March with Counter Support - 1 x daily - 4 x weekly - 3 sets - 15 reps  Education provided with VC/TC and demonstration for movement at target joints and to facilitate correct muscle activation with all exercises. Pt exhibits good carryover within session.  PT Education - 06/26/20 1752    Education Details exercise technique, body mechanics, HEP    Person(s) Educated Patient    Methods Explanation;Handout;Demonstration;Verbal cues    Comprehension Verbalized understanding;Returned demonstration            PT Short Term  Goals - 05/21/20 1639      PT SHORT TERM GOAL #1   Title Pt will be independent with HEP in order to improve strength and balance in order to decrease fall risk and improve function at home and work.    Baseline 4/18: to be initiated    Time 6    Period Weeks    Status New    Target Date 07/02/20             PT Long Term Goals - 05/30/20 1553      PT LONG TERM GOAL #1   Title Pt will improve DGI by at least 3 points in order to demonstrate clinically significant improvement in balance and decreased risk for falls.    Baseline 4/18: 18/24    Time 12    Period Weeks    Status New    Target Date 08/13/20      PT LONG TERM GOAL #2   Title Pt will improve ABC scale by at least 13% in order to demonstrate clinically significant improvement in balance confidence.    Baseline 4/18: 63.75%    Time 12    Period Weeks    Status New    Target Date 08/13/20      PT LONG TERM GOAL #3   Title Pt will demonstrate improved ability to maintain balance on compliant surface with EC for at least 5 seconds to indicate improved ability to navigate uneven surfaces and ambulate in dimly lit rooms.    Baseline 4/18: Pt unable to maintain balance with EC on foam on CTSIB-M    Time 12    Period Weeks    Status New    Target Date 08/13/20      PT LONG TERM GOAL #4   Title Patient will increase BLE gross strength to 4+/5 as to improve functional strength for independent gait, increased standing tolerance and increased ADL ability.    Baseline 4/18: gross BLE strength currnelty 4/5    Time 12    Period Weeks    Status New    Target Date 08/13/20      PT LONG TERM GOAL #5   Title Patient will increase FOTO score to equal to or greater than 80 to demonstrate improvement in mobility and quality of life.    Baseline 4/18: 74%    Time 12    Period Weeks    Status New    Target Date 08/13/20      PT LONG TERM GOAL #6   Title Patient will reduce dizziness handicap inventory score to <10, for less  dizziness with ADLs and increased safety with home and work tasks.    Baseline 4/27: DHI score is 16, indicating mild handicap  Time 11    Period Weeks    Status New    Target Date 08/13/20                 Plan - 06/26/20 1755    Clinical Impression Statement Pt with improved tolerance for activity this session. PT did reinforce importance of pt eating a meal at some point before PT as pt reports she had not eaten again so far today. Pt verbalizes understanding. Introduced UE strengthening to assist with pt floor transfers as she reports difficulty using her UEs to assist her getting of the floor in case of a fall. Pt also still challenged with obstacle course with navigating cones and clearing LEs over hurlde. Pt will benefit from further skilled therapy to improve strength and balance to decrease fall risk.    Personal Factors and Comorbidities Age;Comorbidity 1;Comorbidity 2;Past/Current Experience;Sex;Social Background;Behavior Pattern;Comorbidity 3+    Comorbidities CVA in Jan 2022, anxiety, hypothyroidism, depression, OA, osteoporosis, memory change, migraine, seizures, hearing loss.    Examination-Activity Limitations Bathing;Stairs;Stand;Hygiene/Grooming;Locomotion Level;Carry;Bed Mobility;Other;Reach Overhead;Lift   bed mobility difficulty d/t pt reports of high mattress; reported reaching overhead d/t balance (via ABC scale)   Examination-Participation Restrictions Community Activity;Yard Work;Shop;Cleaning;Laundry;Medication Management;Meal Prep    Stability/Clinical Decision Making Evolving/Moderate complexity    Rehab Potential Good    PT Frequency 2x / week    PT Duration 12 weeks    PT Treatment/Interventions ADLs/Self Care Home Management;Biofeedback;Canalith Repostioning;Cryotherapy;Electrical Stimulation;Aquatic Therapy;Moist Heat;Ultrasound;DME Instruction;Gait training;Stair training;Functional mobility training;Therapeutic activities;Therapeutic exercise;Balance  training;Neuromuscular re-education;Cognitive remediation;Patient/family education;Orthotic Fit/Training;Manual techniques;Passive range of motion;Energy conservation;Taping;Splinting;Vestibular;Visual/perceptual remediation/compensation;Joint Manipulations    PT Next Visit Plan initiate HEP, higher level balance assessment, DHI, dynamic balance, continue POC as previously indicated    PT Home Exercise Plan Access Code: [email or web address]    Consulted and Agree with Plan of Care Patient           Patient will benefit from skilled therapeutic intervention in order to improve the following deficits and impairments:  Abnormal gait,Decreased balance,Decreased endurance,Difficulty walking,Decreased range of motion,Dizziness,Improper body mechanics,Impaired vision/preception,Decreased coordination,Decreased strength,Decreased mobility,Obesity,Decreased activity tolerance,Decreased knowledge of use of DME,Impaired flexibility,Postural dysfunction  Visit Diagnosis: Unsteadiness on feet  Muscle weakness (generalized)  Other abnormalities of gait and mobility  Other lack of coordination     Problem List Patient Active Problem List   Diagnosis Date Noted  . Acute ITP ([place]) 05/02/2020  . Other intra-abdominal and pelvic swelling, mass and lump 04/26/2020  . Pelvic mass in female 04/25/2020  . Ovarian cancer screening 04/25/2020  . Ovarian mass, left 04/25/2020  . Stroke (cerebrum) ([place]) 04/20/2020  . Acute CVA (cerebrovascular accident) ([place]) 04/19/2020  . CVA (cerebral vascular accident) ([place]) 04/19/2020  . History of ischemic multifocal posterior circulation stroke 02/29/2020  . Thrombocytopenia ([place]) 02/28/2020  . HPV test positive 07/08/2015  . Difficulty hearing 06/28/2015  . Arthritis, degenerative 06/28/2015  . Obesity, Class I, BMI 30-34.9 06/28/2015  . Paresthesia 06/28/2015  . Purpura, nonthrombopenic ([place]) 06/28/2015  . Other specified hypothyroidism 08/09/2014  . Migraine without  aura and without status migrainosus, not intractable 09/19/2008  . Moderate major depression ([place]) 09/07/2006  . OP (osteoporosis) 09/07/2006   Ricard Dillon PT, DPT 06/26/2020, 5:59 PM  [place] MAIN [facility] SERVICES [address] [place], [place], [address] Phone: [phone]   Fax:  [phone]  Name: Natalie [name] MRN: [id number] Date of Birth: 19-Sep-1950

## 2020-07-02 LAB — CUP PACEART REMOTE DEVICE CHECK
Date Time Interrogation Session: 20220528101543
Implantable Pulse Generator Implant Date: 20220321

## 2020-07-03 ENCOUNTER — Ambulatory Visit (INDEPENDENT_AMBULATORY_CARE_PROVIDER_SITE_OTHER): Payer: Medicare Other

## 2020-07-03 ENCOUNTER — Ambulatory Visit: Payer: Medicare Other

## 2020-07-03 DIAGNOSIS — I639 Cerebral infarction, unspecified: Secondary | ICD-10-CM | POA: Diagnosis not present

## 2020-07-05 ENCOUNTER — Other Ambulatory Visit: Payer: Self-pay

## 2020-07-05 ENCOUNTER — Ambulatory Visit: Payer: Medicare Other | Attending: Family Medicine

## 2020-07-05 DIAGNOSIS — R2689 Other abnormalities of gait and mobility: Secondary | ICD-10-CM | POA: Diagnosis not present

## 2020-07-05 DIAGNOSIS — R278 Other lack of coordination: Secondary | ICD-10-CM | POA: Insufficient documentation

## 2020-07-05 DIAGNOSIS — I63412 Cerebral infarction due to embolism of left middle cerebral artery: Secondary | ICD-10-CM | POA: Diagnosis not present

## 2020-07-05 DIAGNOSIS — M6281 Muscle weakness (generalized): Secondary | ICD-10-CM | POA: Diagnosis not present

## 2020-07-05 DIAGNOSIS — R2681 Unsteadiness on feet: Secondary | ICD-10-CM | POA: Insufficient documentation

## 2020-07-05 NOTE — Therapy (Signed)
[place] MAIN [facility] SERVICES [address] [place], [place], [address] Phone: [phone]   Fax:  [phone]  Physical Therapy Treatment  Patient Details  Name: Natalie [name] MRN: [id number] Date of Birth: [birth year] Referring Provider (PT): [name], MD   Encounter Date: 07/05/2020   PT End of Session - 07/06/20 1111    Visit Number 5    Number of Visits 25    Date for PT Re-Evaluation 08/13/20    Authorization Time Period eval performed 05/21/2020    PT Start Time 1606    PT Stop Time 1649    PT Time Calculation (min) 43 min    Equipment Utilized During Treatment Gait belt    Activity Tolerance Patient tolerated treatment well    Behavior During Therapy [facility] for tasks assessed/performed           Past Medical History:  Diagnosis Date  . Acute bronchospasm due to viral infection   . Anemia    history of  . CVA (cerebral vascular accident) ([place]) 02/28/2020  . Depressive disorder   . Epistaxis   . Fatigue   . Hearing loss   . Hypothyroidism   . Memory change   . Migraine   . Numbness and tingling   . Obesity (BMI 30.0-34.9)   . Osteoarthritis   . Osteoporosis   . Polyuria   . Premature menopause   . Seizures ([place])    history of mini seizures, possible migraine induced    Past Surgical History:  Procedure Laterality Date  . ABLATION  2006  . BUBBLE STUDY  04/23/2020   Procedure: BUBBLE STUDY;  Surgeon: Larey Dresser, MD;  Location: [facility] ENDOSCOPY;  Service: Cardiovascular;;  . CATARACT EXTRACTION W/ INTRAOCULAR LENS IMPLANT Bilateral   . COLONOSCOPY    . COLONOSCOPY WITH PROPOFOL N/A 12/22/2018   Procedure: COLONOSCOPY WITH PROPOFOL;  Surgeon: Virgel Manifold, MD;  Location: [facility] ENDOSCOPY;  Service: Endoscopy;  Laterality: N/A;  . EYE SURGERY  Spring 2018   Cataracts both eyes  . LOOP RECORDER INSERTION N/A 04/23/2020   Procedure: LOOP RECORDER INSERTION;  Surgeon: Evans Lance, MD;  Location: [place] CV LAB;  Service: Cardiovascular;  Laterality: N/A;  . TEE WITHOUT CARDIOVERSION N/A 04/23/2020   Procedure: TRANSESOPHAGEAL ECHOCARDIOGRAM (TEE);  Surgeon: Larey Dresser, MD;  Location: [facility] ENDOSCOPY;  Service: Cardiovascular;  Laterality: N/A;  . TONSILLECTOMY      There were no vitals filed for this visit.   Subjective Assessment - 07/06/20 1110    Subjective Pt reports no fall since last appointment. Pt states she forgot about last session which is why she missed it. She reports feeling frustrated with word-finding issues. She reports she went to the beach and had trouble walking on sand and in the water, and that a couple had to help her walk back to her car.    Pertinent History Pt confirms the following hx is accurate (initially from acute care PT eval): "the pt is a 70 yo female presenting 3/17 with confusion and difficulty word-finding. Imaging revealed L MCA infarcts with nearly occlusive thrombus in proximal M2 and M3." PMH includes the following: CVA in Jan 2022 (for further details see ACUTE PT eval in EMR from 02/29/2020), anxiety, hypothyroidism, depression, OA, osteoporosis, memory change, migraine, seizures, hearing loss.    Limitations Standing;Walking;Lifting;House hold activities;Writing   she says she limits how heavy she lifts due to hesitation   How long can you sit  comfortably? not limited    How long can you stand comfortably? Pt reports she doesn't like to stand still for very long    How long can you walk comfortably? Difficulty with balance when walking over uneven ground, but states she is able to walk a mile. She says sometimes she gets tired walking and will sit down. She feels overall she's been fairly energetic.    Diagnostic tests per chart: "[place] 04/19/20: MR BRAIN IMPRESSION:  1. Patchy small volume acute ischemic nonhemorrhagic posterior left  MCA distribution infarcts, likely reflecting an embolic shower. No  associated hemorrhage or mass effect.  2. No  other acute intracranial abnormality.  3. Underlying mild chronic microvascular ischemic disease.; 3/17 CT ANGIO HEAD NECK:  IMPRESSION:  Nearly occlusive thrombus at the bifurcation of a proximal M2 MCA  branch approximately 6 mm from the MCA bifurcation. Subsequent  occlusion of a proximal left M3 MCA branch. No stenosis in the neck."    Patient Stated Goals Pt says she wants to improve her balance and does not want to fall again    Currently in Pain? No/denies           TREATMENT  Therex: 2 sets of 10 reps for each: STS Standing hip abduction; pt rates as hard Standing marches (10 per leg, 20 total per round) LAQ with 2.5# AW (10 per leg, 20 total per round); second round used 4# AWs and pt rates medium Heel raises with 4# AW Seated dorsiflexion   UE strengthening to assist floor transfers: Matrix Lat pulldown with 12.5# 10x, 10x at 17.5#, 10x 22.5#    Neuro Re-education: CGA provided for all the following exercises  Standing on airex pad NBOS EO, EC 2x30 sec each  Standing on airex pad vertical head turns with EC 2x30 sec  Standing on airex pad horizontal head turns with EC 2x30 sec   Education provided with VC/TC and demonstration for movement at target joints and to facilitate correct muscle activation with all exercises. Pt exhibits good carryover within session.    PT Education - 07/06/20 1111    Education Details exercise technique, body mechanics with standing hip abduction    Person(s) Educated Patient    Methods Explanation;Demonstration;Verbal cues    Comprehension Verbalized understanding;Returned demonstration            PT Short Term Goals - 05/21/20 1639      PT SHORT TERM GOAL #1   Title Pt will be independent with HEP in order to improve strength and balance in order to decrease fall risk and improve function at home and work.    Baseline 4/18: to be initiated    Time 6    Period Weeks    Status New    Target Date 07/02/20             PT  Long Term Goals - 05/30/20 1553      PT LONG TERM GOAL #1   Title Pt will improve DGI by at least 3 points in order to demonstrate clinically significant improvement in balance and decreased risk for falls.    Baseline 4/18: 18/24    Time 12    Period Weeks    Status New    Target Date 08/13/20      PT LONG TERM GOAL #2   Title Pt will improve ABC scale by at least 13% in order to demonstrate clinically significant improvement in balance confidence.    Baseline 4/18: 63.75%  Time 12    Period Weeks    Status New    Target Date 08/13/20      PT LONG TERM GOAL #3   Title Pt will demonstrate improved ability to maintain balance on compliant surface with EC for at least 5 seconds to indicate improved ability to navigate uneven surfaces and ambulate in dimly lit rooms.    Baseline 4/18: Pt unable to maintain balance with EC on foam on CTSIB-M    Time 12    Period Weeks    Status New    Target Date 08/13/20      PT LONG TERM GOAL #4   Title Patient will increase BLE gross strength to 4+/5 as to improve functional strength for independent gait, increased standing tolerance and increased ADL ability.    Baseline 4/18: gross BLE strength currnelty 4/5    Time 12    Period Weeks    Status New    Target Date 08/13/20      PT LONG TERM GOAL #5   Title Patient will increase FOTO score to equal to or greater than 80 to demonstrate improvement in mobility and quality of life.    Baseline 4/18: 74%    Time 12    Period Weeks    Status New    Target Date 08/13/20      PT LONG TERM GOAL #6   Title Patient will reduce dizziness handicap inventory score to <10, for less dizziness with ADLs and increased safety with home and work tasks.    Baseline 4/27: DHI score is 16, indicating mild handicap    Time 11    Period Weeks    Status New    Target Date 08/13/20                 Plan - 07/06/20 1112    Clinical Impression Statement PT discussed with pt following-up with her  doctor regarding word-finding issues for possible speech referral. Pt verbalized understanding as she reports her word-finding difficulties are really bothering her. Pt most challenged this session with standing hip abduction exercise, which she rates as hard, and requiried increased cuing for technique. Continued to focus on UE strengthening today as well to improve UE strength for floor transfers. Pt overall required CGA for static balance exercises. Pt will benefit from further skilled PT to improve strength and balance to decrease fall risk and increase safety with functional mobility.    Personal Factors and Comorbidities Age;Comorbidity 1;Comorbidity 2;Past/Current Experience;Sex;Social Background;Behavior Pattern;Comorbidity 3+    Comorbidities CVA in Jan 2022, anxiety, hypothyroidism, depression, OA, osteoporosis, memory change, migraine, seizures, hearing loss.    Examination-Activity Limitations Bathing;Stairs;Stand;Hygiene/Grooming;Locomotion Level;Carry;Bed Mobility;Other;Reach Overhead;Lift   bed mobility difficulty d/t pt reports of high mattress; reported reaching overhead d/t balance (via ABC scale)   Examination-Participation Restrictions Community Activity;Yard Work;Shop;Cleaning;Laundry;Medication Management;Meal Prep    Stability/Clinical Decision Making Evolving/Moderate complexity    Rehab Potential Good    PT Frequency 2x / week    PT Duration 12 weeks    PT Treatment/Interventions ADLs/Self Care Home Management;Biofeedback;Canalith Repostioning;Cryotherapy;Electrical Stimulation;Aquatic Therapy;Moist Heat;Ultrasound;DME Instruction;Gait training;Stair training;Functional mobility training;Therapeutic activities;Therapeutic exercise;Balance training;Neuromuscular re-education;Cognitive remediation;Patient/family education;Orthotic Fit/Training;Manual techniques;Passive range of motion;Energy conservation;Taping;Splinting;Vestibular;Visual/perceptual remediation/compensation;Joint  Manipulations    PT Next Visit Plan initiate HEP, higher level balance assessment, DHI, dynamic balance, floor transfers    PT Home Exercise Plan Access Code: [email or web address]    Consulted and Agree with Plan of Care Patient           Patient  will benefit from skilled therapeutic intervention in order to improve the following deficits and impairments:  Abnormal gait,Decreased balance,Decreased endurance,Difficulty walking,Decreased range of motion,Dizziness,Improper body mechanics,Impaired vision/preception,Decreased coordination,Decreased strength,Decreased mobility,Obesity,Decreased activity tolerance,Decreased knowledge of use of DME,Impaired flexibility,Postural dysfunction  Visit Diagnosis: Unsteadiness on feet  Other abnormalities of gait and mobility  Other lack of coordination  Muscle weakness (generalized)     Problem List Patient Active Problem List   Diagnosis Date Noted  . Acute ITP ([place]) 05/02/2020  . Other intra-abdominal and pelvic swelling, mass and lump 04/26/2020  . Pelvic mass in female 04/25/2020  . Ovarian cancer screening 04/25/2020  . Ovarian mass, left 04/25/2020  . Stroke (cerebrum) ([place]) 04/20/2020  . Acute CVA (cerebrovascular accident) ([place]) 04/19/2020  . CVA (cerebral vascular accident) ([place]) 04/19/2020  . History of ischemic multifocal posterior circulation stroke 02/29/2020  . Thrombocytopenia ([place]) 02/28/2020  . HPV test positive 07/08/2015  . Difficulty hearing 06/28/2015  . Arthritis, degenerative 06/28/2015  . Obesity, Class I, BMI 30-34.9 06/28/2015  . Paresthesia 06/28/2015  . Purpura, nonthrombopenic ([place]) 06/28/2015  . Other specified hypothyroidism 08/09/2014  . Migraine without aura and without status migrainosus, not intractable 09/19/2008  . Moderate major depression ([place]) 09/07/2006  . OP (osteoporosis) 09/07/2006   Ricard Dillon PT, DPT 07/06/2020, 11:18 AM  [place] MAIN [facility] SERVICES [address] [place], [place], [address] Phone: [phone]   Fax:  [phone]  Name: Natalie [name] MRN: [id number] Date of Birth: March 19, 1950

## 2020-07-10 ENCOUNTER — Ambulatory Visit: Payer: Medicare Other

## 2020-07-10 ENCOUNTER — Other Ambulatory Visit: Payer: Self-pay

## 2020-07-10 DIAGNOSIS — M6281 Muscle weakness (generalized): Secondary | ICD-10-CM

## 2020-07-10 DIAGNOSIS — R278 Other lack of coordination: Secondary | ICD-10-CM | POA: Diagnosis not present

## 2020-07-10 DIAGNOSIS — R2681 Unsteadiness on feet: Secondary | ICD-10-CM | POA: Diagnosis not present

## 2020-07-10 DIAGNOSIS — R2689 Other abnormalities of gait and mobility: Secondary | ICD-10-CM | POA: Diagnosis not present

## 2020-07-10 DIAGNOSIS — I63412 Cerebral infarction due to embolism of left middle cerebral artery: Secondary | ICD-10-CM | POA: Diagnosis not present

## 2020-07-10 NOTE — Therapy (Signed)
[place] MAIN [facility] SERVICES [address] [place], [place], [address] Phone: [phone]   Fax:  [phone]  Physical Therapy Treatment  Patient Details  Name: Natalie [name] MRN: [id number] Date of Birth: 1950-03-27 Referring Provider (PT): [name], MD   Encounter Date: 07/10/2020   PT End of Session - 07/10/20 1702    Visit Number 6    Number of Visits 25    Date for PT Re-Evaluation 08/13/20    Authorization Time Period eval performed 05/21/2020    PT Start Time 1527    PT Stop Time 1600    PT Time Calculation (min) 33 min    Equipment Utilized During Treatment Gait belt    Activity Tolerance Patient tolerated treatment well    Behavior During Therapy [facility] for tasks assessed/performed           Past Medical History:  Diagnosis Date  . Acute bronchospasm due to viral infection   . Anemia    history of  . CVA (cerebral vascular accident) ([place]) 02/28/2020  . Depressive disorder   . Epistaxis   . Fatigue   . Hearing loss   . Hypothyroidism   . Memory change   . Migraine   . Numbness and tingling   . Obesity (BMI 30.0-34.9)   . Osteoarthritis   . Osteoporosis   . Polyuria   . Premature menopause   . Seizures ([place])    history of mini seizures, possible migraine induced    Past Surgical History:  Procedure Laterality Date  . ABLATION  2006  . BUBBLE STUDY  04/23/2020   Procedure: BUBBLE STUDY;  Surgeon: Larey Dresser, MD;  Location: [facility] ENDOSCOPY;  Service: Cardiovascular;;  . CATARACT EXTRACTION W/ INTRAOCULAR LENS IMPLANT Bilateral   . COLONOSCOPY    . COLONOSCOPY WITH PROPOFOL N/A 12/22/2018   Procedure: COLONOSCOPY WITH PROPOFOL;  Surgeon: Virgel Manifold, MD;  Location: [facility] ENDOSCOPY;  Service: Endoscopy;  Laterality: N/A;  . EYE SURGERY  Spring 2018   Cataracts both eyes  . LOOP RECORDER INSERTION N/A 04/23/2020   Procedure: LOOP RECORDER INSERTION;  Surgeon: Evans Lance, MD;  Location: [place] CV LAB;  Service: Cardiovascular;  Laterality: N/A;  . TEE WITHOUT CARDIOVERSION N/A 04/23/2020   Procedure: TRANSESOPHAGEAL ECHOCARDIOGRAM (TEE);  Surgeon: Larey Dresser, MD;  Location: [facility] ENDOSCOPY;  Service: Cardiovascular;  Laterality: N/A;  . TONSILLECTOMY      There were no vitals filed for this visit.   Subjective Assessment - 07/10/20 1527    Subjective Pt reports no major changes since last appointment. Pt says she is late today because she thought appointment started at 3:30.    Pertinent History Pt confirms the following hx is accurate (initially from acute care PT eval): "the pt is a 70 yo female presenting 3/17 with confusion and difficulty word-finding. Imaging revealed L MCA infarcts with nearly occlusive thrombus in proximal M2 and M3." PMH includes the following: CVA in Jan 2022 (for further details see ACUTE PT eval in EMR from 02/29/2020), anxiety, hypothyroidism, depression, OA, osteoporosis, memory change, migraine, seizures, hearing loss.    Limitations Standing;Walking;Lifting;House hold activities;Writing   she says she limits how heavy she lifts due to hesitation   How long can you sit comfortably? not limited    How long can you stand comfortably? Pt reports she doesn't like to stand still for very long    How long can you walk comfortably? Difficulty with balance when  walking over uneven ground, but states she is able to walk a mile. She says sometimes she gets tired walking and will sit down. She feels overall she's been fairly energetic.    Diagnostic tests per chart: "[place] 04/19/20: MR BRAIN IMPRESSION:  1. Patchy small volume acute ischemic nonhemorrhagic posterior left  MCA distribution infarcts, likely reflecting an embolic shower. No  associated hemorrhage or mass effect.  2. No other acute intracranial abnormality.  3. Underlying mild chronic microvascular ischemic disease.; 3/17 CT ANGIO HEAD NECK:  IMPRESSION:  Nearly occlusive thrombus at the  bifurcation of a proximal M2 MCA  branch approximately 6 mm from the MCA bifurcation. Subsequent  occlusion of a proximal left M3 MCA branch. No stenosis in the neck."    Patient Stated Goals Pt says she wants to improve her balance and does not want to fall again    Currently in Pain? No/denies            TREATMENT   Therex: 2 sets of 12 reps for each: STS; pt rates medium-hard. Added RTB around knees as cue to decrease knee valgus, pt exhibits improvement with technique Standing hip abduction with RTB; pt rates as medium LAQ with 4# AW; pt rates medium   Standing Heel raises with 4# AW - 1x20; pt rates easy  Walking lunges 6x in // bars - pt reports fatigue    Neuro Re-education: CGA provided for all the following exercises  Floor transfer from mat from seated>quadruped>half-kneeling>standing (uses UEs to pull up to standing due to decreased LE strength to complete transfer)  SLB 2x30 sec each LE  Education provided with VC/TC and demonstration for movement at target joints and to facilitate correct muscle activation with all exercises. Pt exhibits good carryover within session.   Access Code: [email or web address] URL: [email or web address] Date: 07/10/2020 Prepared by: Ricard Dillon  Exercises Standing Elbow Extension with Anchored Resistance - 1 x daily - 4 x weekly - 3 sets - 10 reps Standing Elbow Extension with Self-Anchored Resistance - 1 x daily - 4 x weekly - 3 sets - 10 reps     PT Education - 07/10/20 1702    Education Details further exercises added to HEP (tricep strengthening, see note)    Person(s) Educated Patient    Methods Explanation;Demonstration;Tactile cues;Verbal cues;Handout    Comprehension Verbalized understanding;Returned demonstration            PT Short Term Goals - 05/21/20 1639      PT SHORT TERM GOAL #1   Title Pt will be independent with HEP in order to improve strength and balance in order to decrease fall risk and improve  function at home and work.    Baseline 4/18: to be initiated    Time 6    Period Weeks    Status New    Target Date 07/02/20             PT Long Term Goals - 05/30/20 1553      PT LONG TERM GOAL #1   Title Pt will improve DGI by at least 3 points in order to demonstrate clinically significant improvement in balance and decreased risk for falls.    Baseline 4/18: 18/24    Time 12    Period Weeks    Status New    Target Date 08/13/20      PT LONG TERM GOAL #2   Title Pt will improve ABC scale by at least 13% in order to demonstrate clinically significant improvement  in balance confidence.    Baseline 4/18: 63.75%    Time 12    Period Weeks    Status New    Target Date 08/13/20      PT LONG TERM GOAL #3   Title Pt will demonstrate improved ability to maintain balance on compliant surface with EC for at least 5 seconds to indicate improved ability to navigate uneven surfaces and ambulate in dimly lit rooms.    Baseline 4/18: Pt unable to maintain balance with EC on foam on CTSIB-M    Time 12    Period Weeks    Status New    Target Date 08/13/20      PT LONG TERM GOAL #4   Title Patient will increase BLE gross strength to 4+/5 as to improve functional strength for independent gait, increased standing tolerance and increased ADL ability.    Baseline 4/18: gross BLE strength currnelty 4/5    Time 12    Period Weeks    Status New    Target Date 08/13/20      PT LONG TERM GOAL #5   Title Patient will increase FOTO score to equal to or greater than 80 to demonstrate improvement in mobility and quality of life.    Baseline 4/18: 74%    Time 12    Period Weeks    Status New    Target Date 08/13/20      PT LONG TERM GOAL #6   Title Patient will reduce dizziness handicap inventory score to <10, for less dizziness with ADLs and increased safety with home and work tasks.    Baseline 4/27: DHI score is 16, indicating mild handicap    Time 11    Period Weeks    Status New     Target Date 08/13/20                 Plan - 07/10/20 1702    Clinical Impression Statement PT session limited secondary to pt late arrival. Instructed pt in floor transfers this date. Pt currently does not have LE strength sufficient to complete transfer from half kneeling to standing without heavy use of UEs pulling on a support surface. Introduced further upper body strengthening to HEP to assist with floor transfers. Pt also performed walking lunges and reported fatigue after approx 6 reps. The pt will benefit from further skilled PT to improve BLE strength and balance in order to increase ease and safety with transfers and functional mobility.    Personal Factors and Comorbidities Age;Comorbidity 1;Comorbidity 2;Past/Current Experience;Sex;Social Background;Behavior Pattern;Comorbidity 3+    Comorbidities CVA in Jan 2022, anxiety, hypothyroidism, depression, OA, osteoporosis, memory change, migraine, seizures, hearing loss.    Examination-Activity Limitations Bathing;Stairs;Stand;Hygiene/Grooming;Locomotion Level;Carry;Bed Mobility;Other;Reach Overhead;Lift   bed mobility difficulty d/t pt reports of high mattress; reported reaching overhead d/t balance (via ABC scale)   Examination-Participation Restrictions Community Activity;Yard Work;Shop;Cleaning;Laundry;Medication Management;Meal Prep    Stability/Clinical Decision Making Evolving/Moderate complexity    Rehab Potential Good    PT Frequency 2x / week    PT Duration 12 weeks    PT Treatment/Interventions ADLs/Self Care Home Management;Biofeedback;Canalith Repostioning;Cryotherapy;Electrical Stimulation;Aquatic Therapy;Moist Heat;Ultrasound;DME Instruction;Gait training;Stair training;Functional mobility training;Therapeutic activities;Therapeutic exercise;Balance training;Neuromuscular re-education;Cognitive remediation;Patient/family education;Orthotic Fit/Training;Manual techniques;Passive range of motion;Energy  conservation;Taping;Splinting;Vestibular;Visual/perceptual remediation/compensation;Joint Manipulations    PT Next Visit Plan initiate HEP, higher level balance assessment, DHI, dynamic balance, floor transfers    PT Home Exercise Plan Access Code: [email or web address], added:  Access Code: [email or web address]    Consulted and Agree with Plan of  Care Patient           Patient will benefit from skilled therapeutic intervention in order to improve the following deficits and impairments:  Abnormal gait,Decreased balance,Decreased endurance,Difficulty walking,Decreased range of motion,Dizziness,Improper body mechanics,Impaired vision/preception,Decreased coordination,Decreased strength,Decreased mobility,Obesity,Decreased activity tolerance,Decreased knowledge of use of DME,Impaired flexibility,Postural dysfunction  Visit Diagnosis: Muscle weakness (generalized)  Unsteadiness on feet  Other abnormalities of gait and mobility     Problem List Patient Active Problem List   Diagnosis Date Noted  . Acute ITP ([place]) 05/02/2020  . Other intra-abdominal and pelvic swelling, mass and lump 04/26/2020  . Pelvic mass in female 04/25/2020  . Ovarian cancer screening 04/25/2020  . Ovarian mass, left 04/25/2020  . Stroke (cerebrum) ([place]) 04/20/2020  . Acute CVA (cerebrovascular accident) ([place]) 04/19/2020  . CVA (cerebral vascular accident) ([place]) 04/19/2020  . History of ischemic multifocal posterior circulation stroke 02/29/2020  . Thrombocytopenia ([place]) 02/28/2020  . HPV test positive 07/08/2015  . Difficulty hearing 06/28/2015  . Arthritis, degenerative 06/28/2015  . Obesity, Class I, BMI 30-34.9 06/28/2015  . Paresthesia 06/28/2015  . Purpura, nonthrombopenic ([place]) 06/28/2015  . Other specified hypothyroidism 08/09/2014  . Migraine without aura and without status migrainosus, not intractable 09/19/2008  . Moderate major depression ([place]) 09/07/2006  . OP (osteoporosis) 09/07/2006   Ricard Dillon PT,  DPT 07/10/2020, 5:07 PM  [place] MAIN [facility] SERVICES [address] [place], [place], [address] Phone: [phone]   Fax:  [phone]  Name: Natalie [name] MRN: [id number] Date of Birth: Aug 19, 1950

## 2020-07-12 ENCOUNTER — Other Ambulatory Visit: Payer: Self-pay

## 2020-07-12 ENCOUNTER — Ambulatory Visit: Payer: Medicare Other

## 2020-07-12 DIAGNOSIS — R2689 Other abnormalities of gait and mobility: Secondary | ICD-10-CM

## 2020-07-12 DIAGNOSIS — R278 Other lack of coordination: Secondary | ICD-10-CM | POA: Diagnosis not present

## 2020-07-12 DIAGNOSIS — I63412 Cerebral infarction due to embolism of left middle cerebral artery: Secondary | ICD-10-CM | POA: Diagnosis not present

## 2020-07-12 DIAGNOSIS — M6281 Muscle weakness (generalized): Secondary | ICD-10-CM

## 2020-07-12 DIAGNOSIS — R2681 Unsteadiness on feet: Secondary | ICD-10-CM | POA: Diagnosis not present

## 2020-07-12 NOTE — Therapy (Signed)
[place] MAIN [facility] SERVICES [address] [place], [place], [address] Phone: [phone]   Fax:  [phone]  Physical Therapy Treatment  Patient Details  Name: Natalie [name] MRN: [id number] Date of Birth: [birth year] Referring Provider (PT): [name], MD   Encounter Date: 07/12/2020   PT End of Session - 07/12/20 1522     Visit Number 7    Number of Visits 25    Date for PT Re-Evaluation 08/13/20    Authorization Type Traditional Medicare with [employer] Supplemental: VL based on certification    Authorization Time Period 05/21/20-08/13/20    PT Start Time 1517    PT Stop Time 1555    PT Time Calculation (min) 38 min    Activity Tolerance Patient tolerated treatment well;No increased pain;Patient limited by fatigue    Behavior During Therapy [facility] for tasks assessed/performed             Past Medical History:  Diagnosis Date   Acute bronchospasm due to viral infection    Anemia    history of   CVA (cerebral vascular accident) ([place]) 02/28/2020   Depressive disorder    Epistaxis    Fatigue    Hearing loss    Hypothyroidism    Memory change    Migraine    Numbness and tingling    Obesity (BMI 30.0-34.9)    Osteoarthritis    Osteoporosis    Polyuria    Premature menopause    Seizures ([place])    history of mini seizures, possible migraine induced    Past Surgical History:  Procedure Laterality Date   ABLATION  2006   BUBBLE STUDY  04/23/2020   Procedure: BUBBLE STUDY;  Surgeon: Larey Dresser, MD;  Location: [place];  Service: Cardiovascular;;   CATARACT EXTRACTION W/ INTRAOCULAR LENS IMPLANT Bilateral    COLONOSCOPY     COLONOSCOPY WITH PROPOFOL N/A 12/22/2018   Procedure: COLONOSCOPY WITH PROPOFOL;  Surgeon: Virgel Manifold, MD;  Location: [facility] ENDOSCOPY;  Service: Endoscopy;  Laterality: N/A;   EYE SURGERY  Spring 2018   Cataracts both eyes   LOOP RECORDER INSERTION N/A 04/23/2020   Procedure: LOOP  RECORDER INSERTION;  Surgeon: Evans Lance, MD;  Location: [place] CV LAB;  Service: Cardiovascular;  Laterality: N/A;   TEE WITHOUT CARDIOVERSION N/A 04/23/2020   Procedure: TRANSESOPHAGEAL ECHOCARDIOGRAM (TEE);  Surgeon: Larey Dresser, MD;  Location: [facility] ENDOSCOPY;  Service: Cardiovascular;  Laterality: N/A;   TONSILLECTOMY      There were no vitals filed for this visit.   Subjective Assessment - 07/12/20 1521     Subjective Pt doing well today, no updates, not additional changes or updates. She has been busy cleaning her house and throwing out things in preparation for company.    Pertinent History Pt confirms the following hx is accurate (initially from acute care PT eval): "the pt is a 70 yo female presenting 3/17 with confusion and difficulty word-finding. Imaging revealed L MCA infarcts with nearly occlusive thrombus in proximal M2 and M3." PMH includes the following: CVA in Jan 2022 (for further details see ACUTE PT eval in EMR from 02/29/2020), anxiety, hypothyroidism, depression, OA, osteoporosis, memory change, migraine, seizures, hearing loss.    Currently in Pain? No/denies             INTERVENTION:  -single heel raises 1x12 bilat, BUE supported on standard walker  - standing ankle AF in tandem 1x10 (too weak to perform in closed chair,  lacks 50% of ROM)  -STS from chair 1x6, stopped at 6 due to fatigue  -seated brace marching 1x12 bilat -seated ankle DF 1x15, 5lb left (too light) 5lb Rt too heavy, 2.5lb Rt (full ROM)  -STS from chair 1x8 (cues to avoid knee knocking)  -seated ankle DF Left 1x15 at 7.5lb  -seated ankle DF Rt 1x15 at 2.5lb  *stairs assessment to discuss role of Ankle DF ROM and strength   -Quadruped to half kneeling 1x20sec bilat, minA -stnading to halfkneeling, shopping for HEP version at pt request 1x15sec bilat.  *not proficient enough yet to safely recommend for home at this time.      PT Short Term Goals - 05/21/20 1639       PT SHORT  TERM GOAL #1   Title Pt will be independent with HEP in order to improve strength and balance in order to decrease fall risk and improve function at home and work.    Baseline 4/18: to be initiated    Time 6    Period Weeks    Status New    Target Date 07/02/20               PT Long Term Goals - 05/30/20 1553       PT LONG TERM GOAL #1   Title Pt will improve DGI by at least 3 points in order to demonstrate clinically significant improvement in balance and decreased risk for falls.    Baseline 4/18: 18/24    Time 12    Period Weeks    Status New    Target Date 08/13/20      PT LONG TERM GOAL #2   Title Pt will improve ABC scale by at least 13% in order to demonstrate clinically significant improvement in balance confidence.    Baseline 4/18: 63.75%    Time 12    Period Weeks    Status New    Target Date 08/13/20      PT LONG TERM GOAL #3   Title Pt will demonstrate improved ability to maintain balance on compliant surface with EC for at least 5 seconds to indicate improved ability to navigate uneven surfaces and ambulate in dimly lit rooms.    Baseline 4/18: Pt unable to maintain balance with EC on foam on CTSIB-M    Time 12    Period Weeks    Status New    Target Date 08/13/20      PT LONG TERM GOAL #4   Title Patient will increase BLE gross strength to 4+/5 as to improve functional strength for independent gait, increased standing tolerance and increased ADL ability.    Baseline 4/18: gross BLE strength currnelty 4/5    Time 12    Period Weeks    Status New    Target Date 08/13/20      PT LONG TERM GOAL #5   Title Patient will increase FOTO score to equal to or greater than 80 to demonstrate improvement in mobility and quality of life.    Baseline 4/18: 74%    Time 12    Period Weeks    Status New    Target Date 08/13/20      PT LONG TERM GOAL #6   Title Patient will reduce dizziness handicap inventory score to <10, for less dizziness with ADLs and  increased safety with home and work tasks.    Baseline 4/27: DHI score is 16, indicating mild handicap    Time 11  Period Weeks    Status New    Target Date 08/13/20                   Plan - 07/12/20 1530     Clinical Impression Statement Continued with current plan of care as laid out in evaluation and recent prior sessions. Pt remains motivated to advance progress toward goals. Rest breaks provided as needed, pt quick to ask when needed. Pt does require varying levels of assistance and cuing for completion of exercises for correct form and sometimes due to pain/weakness. Pt closely monitored throughout session for safe vitals response and to maximize patient safety during interventions. Pt continues to demonstrate progress toward goals AEB progression of some interventions this date either in volume or intensity.   Personal Factors and Comorbidities Age;Comorbidity 1;Comorbidity 2;Past/Current Experience;Sex;Social Background;Behavior Pattern;Comorbidity 3+    Comorbidities CVA in Jan 2022, anxiety, hypothyroidism, depression, OA, osteoporosis, memory change, migraine, seizures, hearing loss.    Examination-Activity Limitations Bathing;Stairs;Stand;Hygiene/Grooming;Locomotion Level;Carry;Bed Mobility;Other;Reach Overhead;Lift    Examination-Participation Restrictions Community Activity;Yard Work;Shop;Cleaning;Laundry;Medication Management;Meal Prep    Stability/Clinical Decision Making Evolving/Moderate complexity    Clinical Decision Making Moderate    Rehab Potential Good    PT Frequency 2x / week    PT Duration 12 weeks    PT Treatment/Interventions ADLs/Self Care Home Management;Biofeedback;Canalith Repostioning;Cryotherapy;Electrical Stimulation;Aquatic Therapy;Moist Heat;Ultrasound;DME Instruction;Gait training;Stair training;Functional mobility training;Therapeutic activities;Therapeutic exercise;Balance training;Neuromuscular re-education;Cognitive remediation;Patient/family  education;Orthotic Fit/Training;Manual techniques;Passive range of motion;Energy conservation;Taping;Splinting;Vestibular;Visual/perceptual remediation/compensation;Joint Manipulations    PT Next Visit Plan higher level balance assessment, DHI, dynamic balance, floor transfers    PT Home Exercise Plan Access Code: [email or web address], added:  Access Code: [email or web address]    Consulted and Agree with Plan of Care Patient             Patient will benefit from skilled therapeutic intervention in order to improve the following deficits and impairments:  Abnormal gait, Decreased balance, Decreased endurance, Difficulty walking, Decreased range of motion, Dizziness, Improper body mechanics, Impaired vision/preception, Decreased coordination, Decreased strength, Decreased mobility, Obesity, Decreased activity tolerance, Decreased knowledge of use of DME, Impaired flexibility, Postural dysfunction  Visit Diagnosis: Muscle weakness (generalized)  Unsteadiness on feet  Other abnormalities of gait and mobility  Other lack of coordination  Cerebrovascular accident (CVA) due to embolism of left middle cerebral artery ([place])     Problem List Patient Active Problem List   Diagnosis Date Noted   Acute ITP ([place]) 05/02/2020   Other intra-abdominal and pelvic swelling, mass and lump 04/26/2020   Pelvic mass in female 04/25/2020   Ovarian cancer screening 04/25/2020   Ovarian mass, left 04/25/2020   Stroke (cerebrum) ([place]) 04/20/2020   Acute CVA (cerebrovascular accident) ([place]) 04/19/2020   CVA (cerebral vascular accident) ([place]) 04/19/2020   History of ischemic multifocal posterior circulation stroke 02/29/2020   Thrombocytopenia ([place]) 02/28/2020   HPV test positive 07/08/2015   Difficulty hearing 06/28/2015   Arthritis, degenerative 06/28/2015   Obesity, Class I, BMI 30-34.9 06/28/2015   Paresthesia 06/28/2015   Purpura, nonthrombopenic ([place]) 06/28/2015   Other specified hypothyroidism 08/09/2014   Migraine  without aura and without status migrainosus, not intractable 09/19/2008   Moderate major depression ([place]) 09/07/2006   OP (osteoporosis) 09/07/2006   4:01 PM, 07/12/20 Natalie [name], PT, DPT Physical Therapist - [place] [phone]     Natalie [name] 07/12/2020, 3:33 PM  [place] MAIN [facility] SERVICES [place], [place],  [place] Phone: [phone]   Fax:  [phone]  Name: Natalie [name] MRN: [id number] Date of Birth: 05-13-1950

## 2020-07-17 ENCOUNTER — Emergency Department
Admission: EM | Admit: 2020-07-17 | Discharge: 2020-07-17 | Disposition: A | Discharge status: Home / Self Care | Payer: Medicare Other | Attending: Emergency Medicine | Admitting: Emergency Medicine

## 2020-07-17 ENCOUNTER — Ambulatory Visit: Payer: Medicare Other

## 2020-07-17 ENCOUNTER — Emergency Department: Payer: Medicare Other

## 2020-07-17 ENCOUNTER — Other Ambulatory Visit: Payer: Self-pay

## 2020-07-17 ENCOUNTER — Ambulatory Visit: Payer: Self-pay

## 2020-07-17 DIAGNOSIS — H539 Unspecified visual disturbance: Secondary | ICD-10-CM

## 2020-07-17 DIAGNOSIS — Z7901 Long term (current) use of anticoagulants: Secondary | ICD-10-CM | POA: Insufficient documentation

## 2020-07-17 DIAGNOSIS — H532 Diplopia: Secondary | ICD-10-CM | POA: Insufficient documentation

## 2020-07-17 DIAGNOSIS — E039 Hypothyroidism, unspecified: Secondary | ICD-10-CM | POA: Diagnosis not present

## 2020-07-17 DIAGNOSIS — Z79899 Other long term (current) drug therapy: Secondary | ICD-10-CM | POA: Insufficient documentation

## 2020-07-17 DIAGNOSIS — R42 Dizziness and giddiness: Secondary | ICD-10-CM | POA: Diagnosis not present

## 2020-07-17 DIAGNOSIS — Z87891 Personal history of nicotine dependence: Secondary | ICD-10-CM | POA: Insufficient documentation

## 2020-07-17 DIAGNOSIS — Z7902 Long term (current) use of antithrombotics/antiplatelets: Secondary | ICD-10-CM | POA: Diagnosis not present

## 2020-07-17 DIAGNOSIS — R55 Syncope and collapse: Secondary | ICD-10-CM | POA: Diagnosis not present

## 2020-07-17 LAB — BASIC METABOLIC PANEL
Anion gap: 7 (ref 5–15)
BUN: 15 mg/dL (ref 8–23)
CO2: 27 mmol/L (ref 22–32)
Calcium: 9 mg/dL (ref 8.9–10.3)
Chloride: 104 mmol/L (ref 98–111)
Creatinine, Ser: 0.83 mg/dL (ref 0.44–1.00)
GFR, Estimated: >60 mL/min (ref >60)
Glucose, Bld: 104 mg/dL — ABNORMAL HIGH (ref 70–99)
Potassium: 4 mmol/L (ref 3.5–5.1)
Sodium: 138 mmol/L (ref 135–145)

## 2020-07-17 LAB — CBC
HCT: 39.4 % (ref 36.0–46.0)
Hemoglobin: 12.9 g/dL (ref 12.0–15.0)
MCH: 31.5 pg (ref 26.0–34.0)
MCHC: 32.7 g/dL (ref 30.0–36.0)
MCV: 96.1 fL (ref 80.0–100.0)
Platelets: 222 10*3/uL (ref 150–400)
RBC: 4.1 MIL/uL (ref 3.87–5.11)
RDW: 12.3 % (ref 11.5–15.5)
WBC: 6.4 10*3/uL (ref 4.0–10.5)
nRBC: 0 % (ref 0.0–0.2)

## 2020-07-17 IMAGING — CT CT HEAD W/O CM
3 series · 16 of 47 positions shown, 19 images · non-contrast
Comparison: Head CT [DATE].

CLINICAL DATA: Episode of dizziness today.

EXAM:
CT HEAD WITHOUT CONTRAST
TECHNIQUE: Contiguous axial images were obtained from the base of the skull
through the vertex without intravenous contrast.

[Series 2: head wo · axial · 0.39mm/px · z∈[-154,-29]mm · 10 of 30 slices shown, 13 images]
[im 3/30  brain]
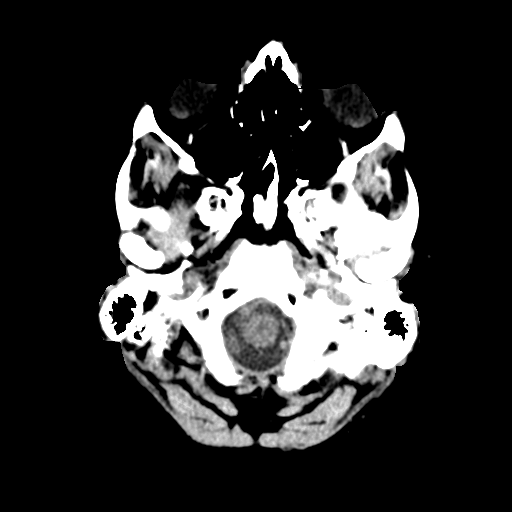
[im 3/30  bone]
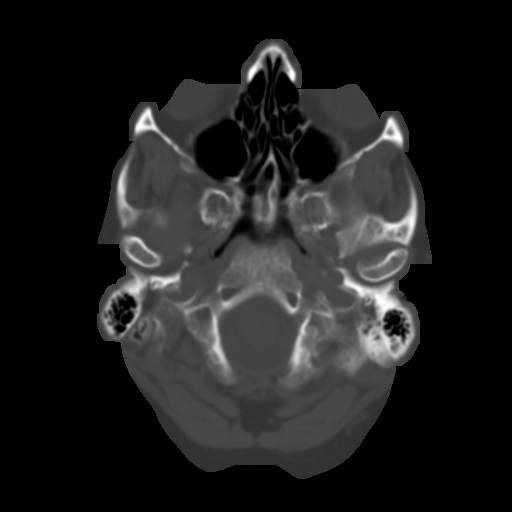
[im 6/30  brain]
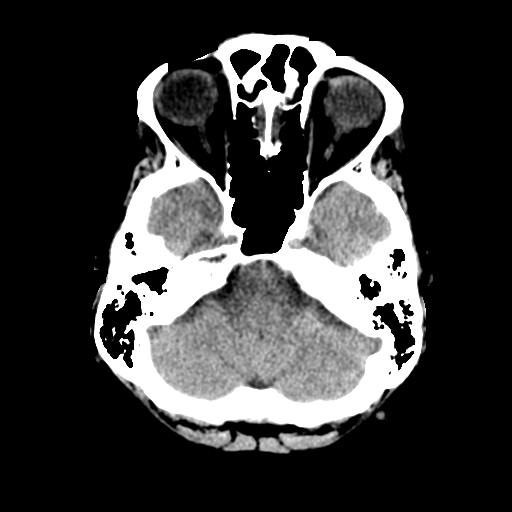
[im 9/30  brain]
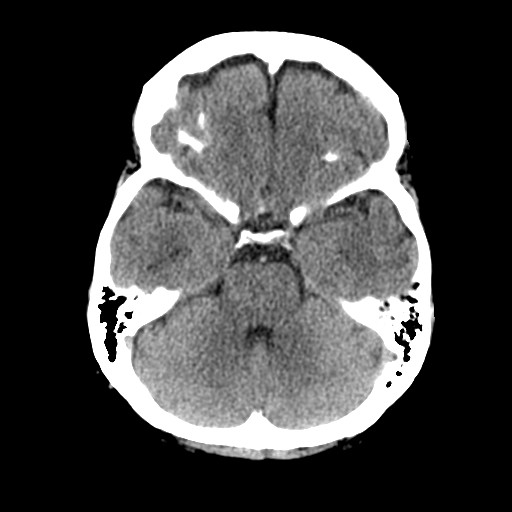
[im 11/30  brain]
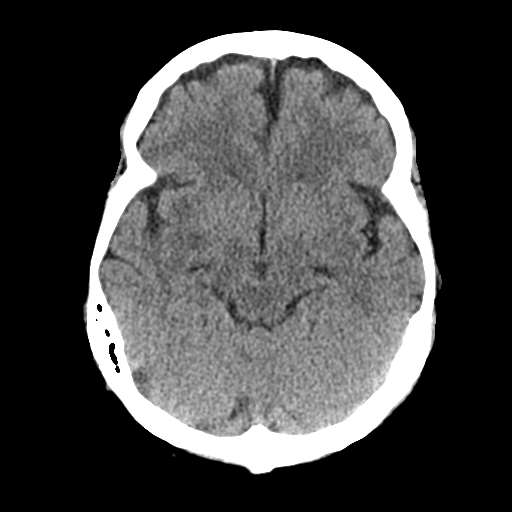
[im 14/30  brain]
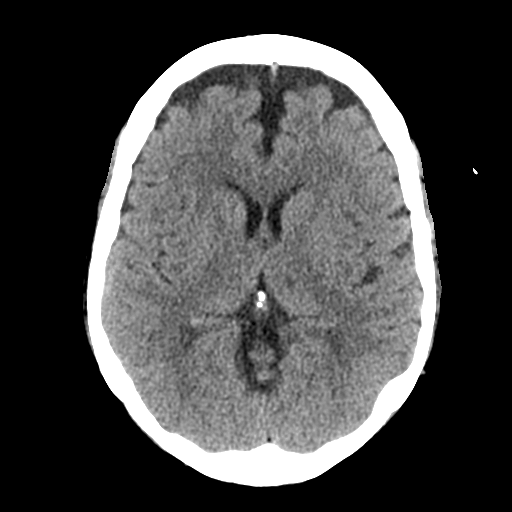
[im 14/30  bone]
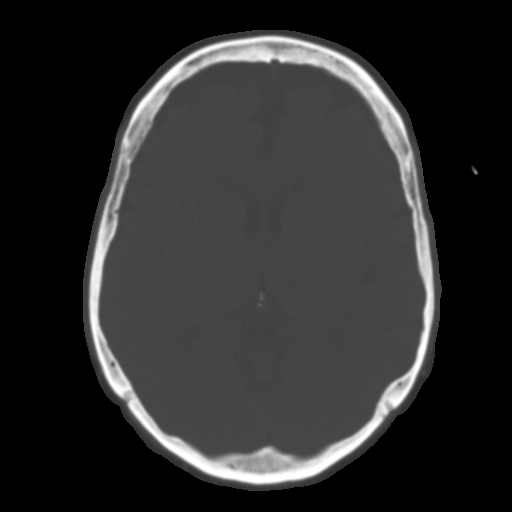
[im 17/30  brain]
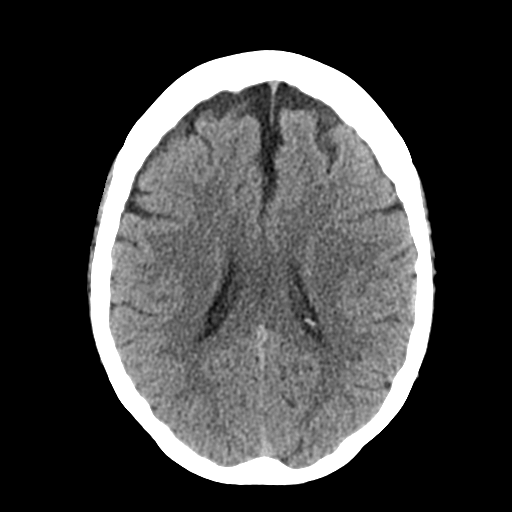
[im 20/30  brain]
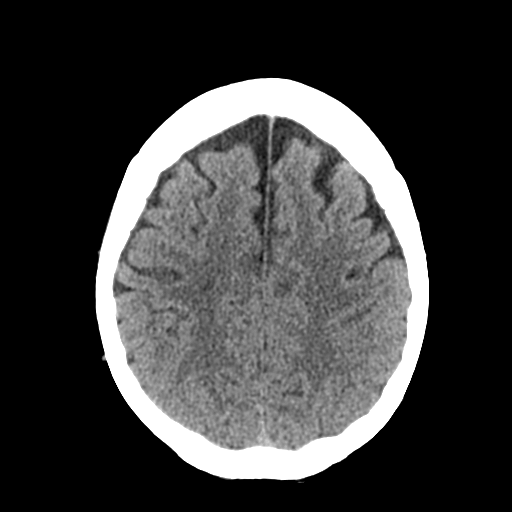
[im 23/30  brain]
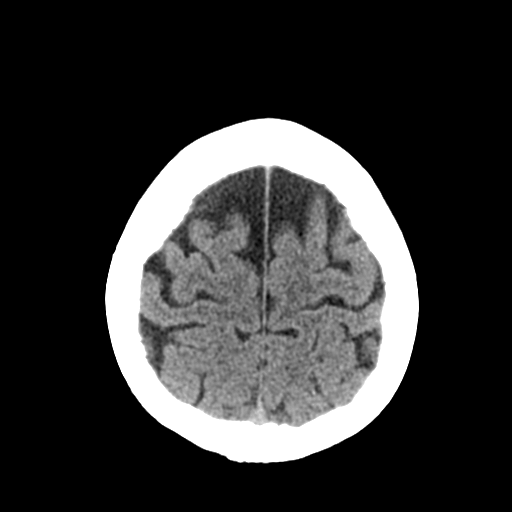
[im 25/30  brain]
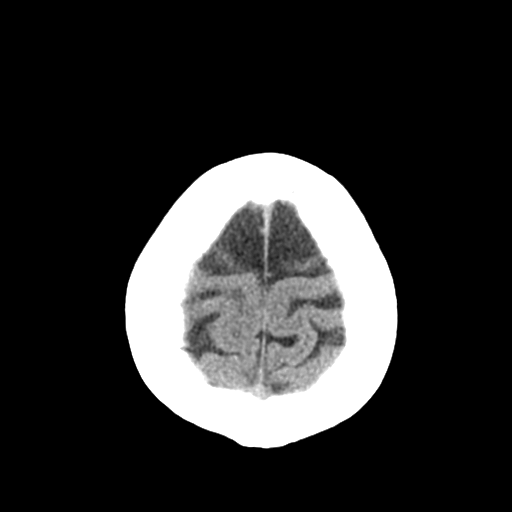
[im 25/30  bone]
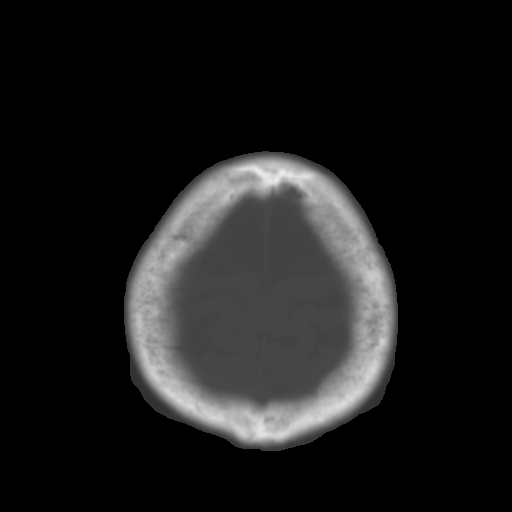
[im 28/30  brain]
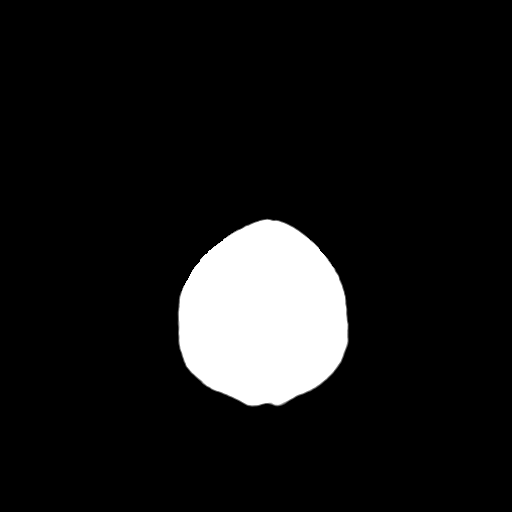

[Series 4: coronal soft tissue · coronal · 0.31mm/px · 3 of 64 slices shown]
[im 22/64  brain]
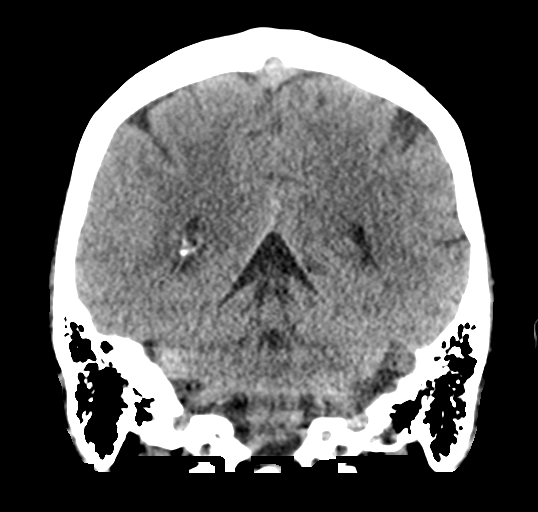
[im 29/64  brain]
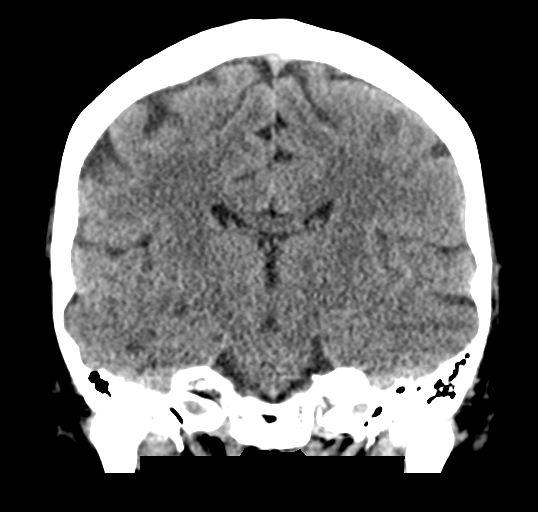
[im 36/64  brain]
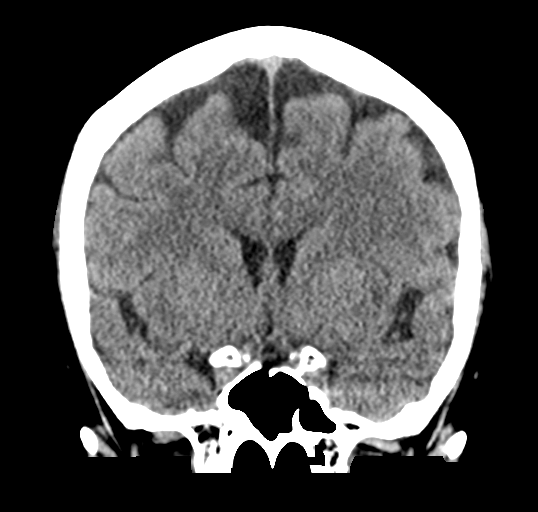

[Series 5: sagittal soft tissue · sagittal · 0.32mm/px · 3 of 58 slices shown]
[im 20/58  brain]
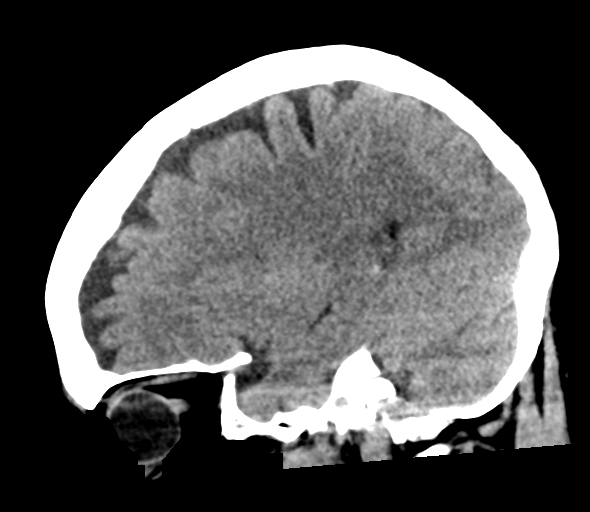
[im 29/58  brain]
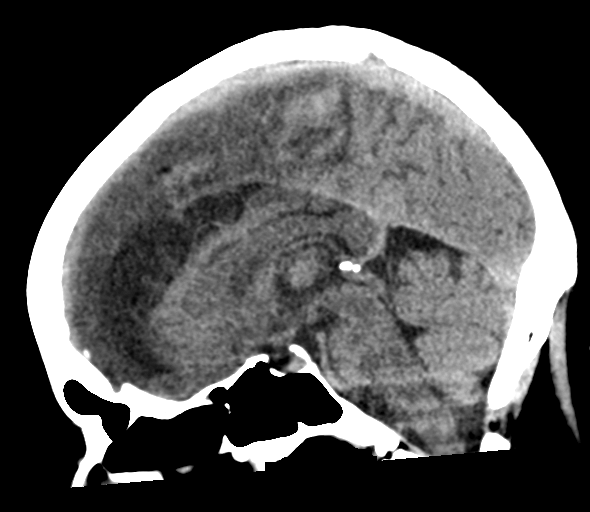
[im 39/58  brain]
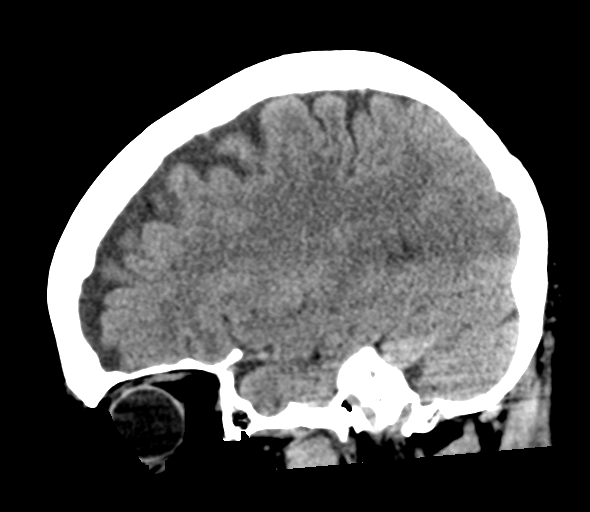

[16 of 47 positions shown; findings below may reference images not displayed]

FINDINGS: Brain: No evidence of acute infarction, hemorrhage, hydrocephalus,
extra-axial collection or mass lesion/mass effect.

Vascular: No hyperdense vessel or unexpected calcification. Focal
density along the left MCA in the Sylvian fissure seen on the prior
examination is not present today.

Skull: Intact.  No focal lesion.

Sinuses/Orbits: Negative.

Other: None.
IMPRESSION: Negative head CT.

## 2020-07-17 IMAGING — MR MR HEAD W/O CM
12 series · 48 of 48 positions shown · non-contrast
Comparison: Head CT [DATE] and MRI [DATE]

CLINICAL DATA: TIA.  Diplopia and imbalance.

EXAM:
MRI HEAD WITHOUT CONTRAST
TECHNIQUE: Multiplanar, multiecho pulse sequences of the brain and surrounding
structures were obtained without intravenous contrast.

[Series 5: ax dwi_tracew · axial · 3.0mm · 0.65mm/px · z∈[-117,+38]mm · 4 of 48 slices shown]
[im 1/48]
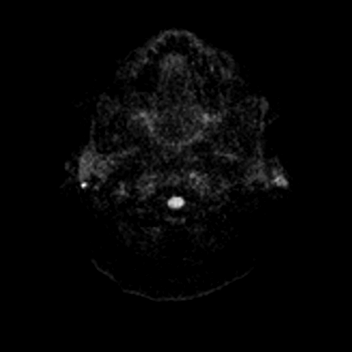
[im 16/48]
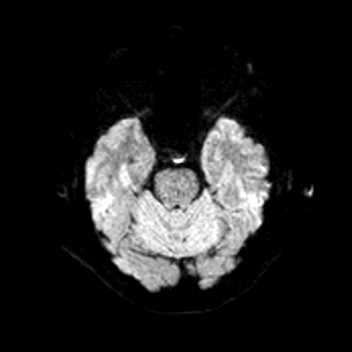
[im 32/48]
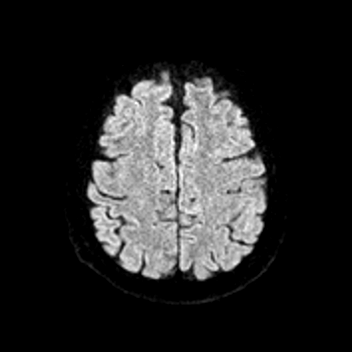
[im 48/48]
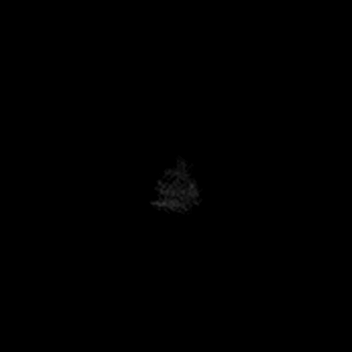

[Series 6: ax dwi_adc · axial · 3.0mm · 0.65mm/px · z∈[-117,+38]mm · 4 of 48 slices shown]
[im 1/48]
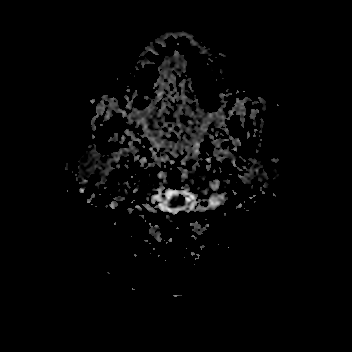
[im 16/48]
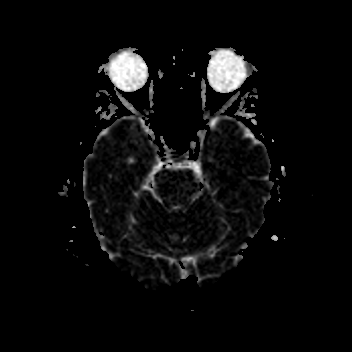
[im 32/48]
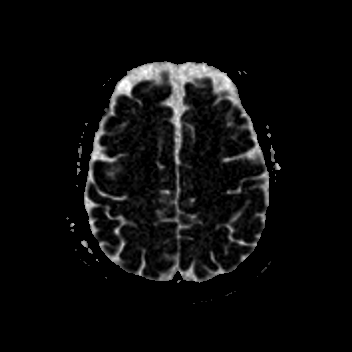
[im 48/48]
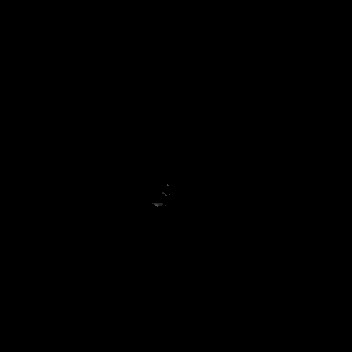

[Series 7: cor dwi_tracew · coronal · 5.0mm · 0.65mm/px · 3 of 40 slices shown]
[im 1/40]
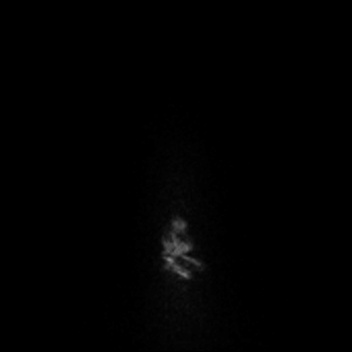
[im 20/40]
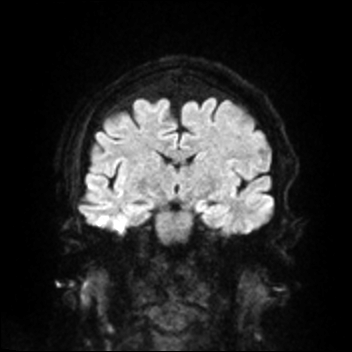
[im 40/40]
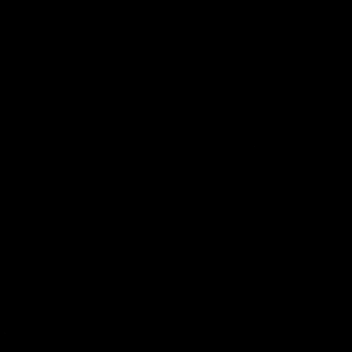

[Series 8: cor dwi_adc · coronal · 5.0mm · 0.65mm/px · 2 of 34 slices shown]
[im 1/34]
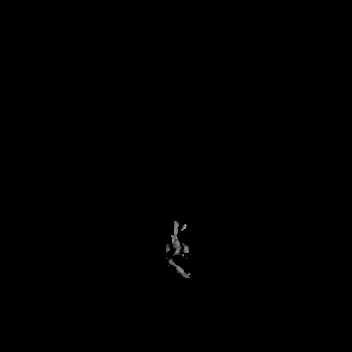
[im 34/34]
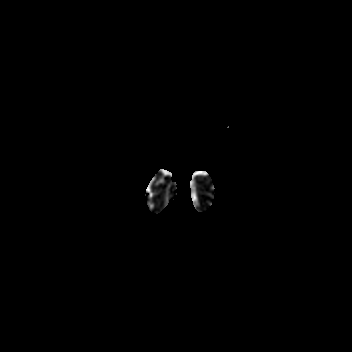

[Series 9: T1 · sagittal · 5.0mm · 0.62mm/px · 2 of 25 slices shown (1 of 2)]
[im 1/25]
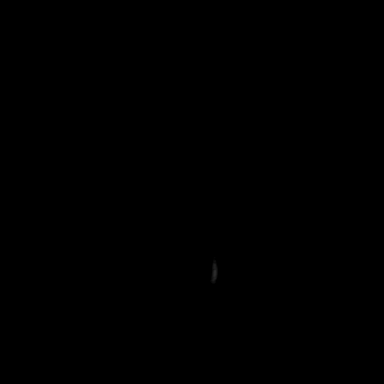
[im 25/25]
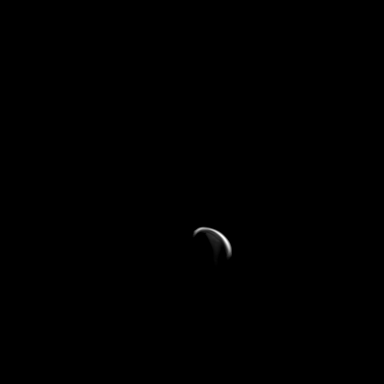

[Series 10: T2 · axial · 5.0mm · 0.53mm/px · z∈[-111,+33]mm · 2 of 25 slices shown (1 of 2)]
[im 1/25]
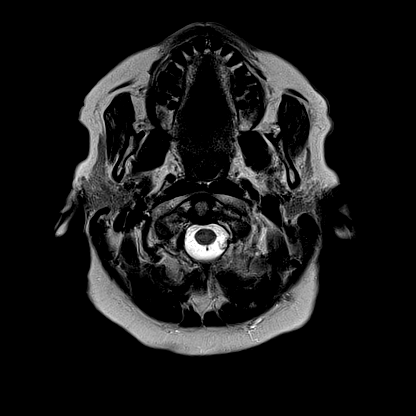
[im 25/25]
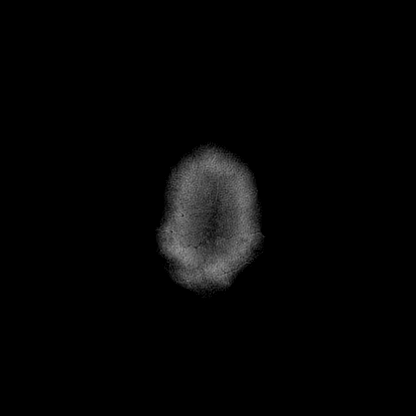

[Series 11: mag_images · axial · 3.0mm · 0.90mm/px · z∈[-127,+49]mm · 4 of 60 slices shown]
[im 1/60]
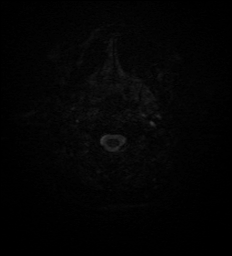
[im 20/60]
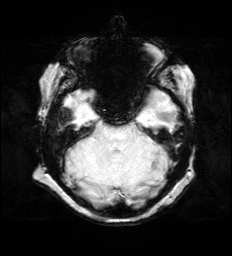
[im 40/60]
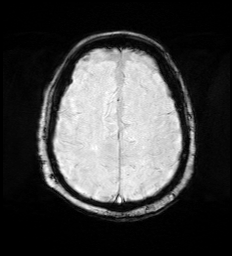
[im 60/60]
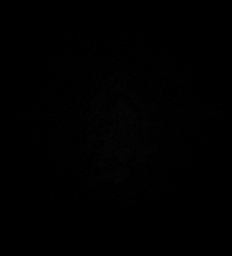

[Series 12: pha_images · axial · 3.0mm · 0.90mm/px · z∈[-127,+49]mm · 4 of 60 slices shown]
[im 1/60]
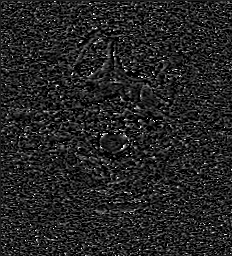
[im 20/60]
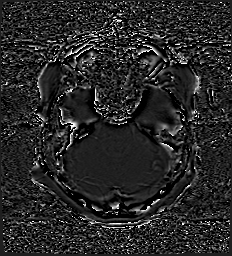
[im 40/60]
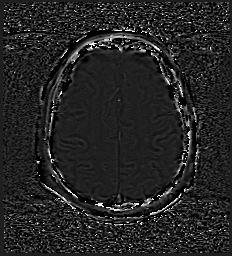
[im 60/60]
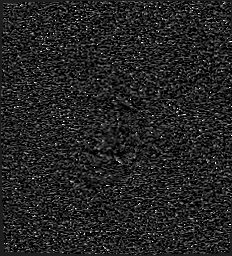

[Series 13: swi_images · axial · 3.0mm · 0.90mm/px · z∈[-127,+49]mm · 4 of 60 slices shown]
[im 1/60]
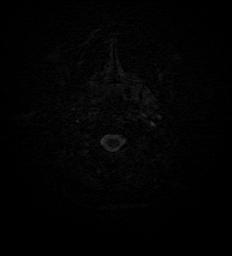
[im 20/60]
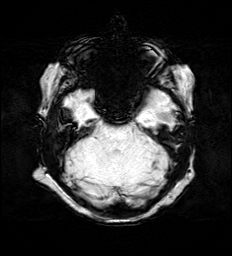
[im 40/60]
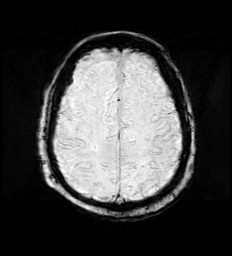
[im 60/60]
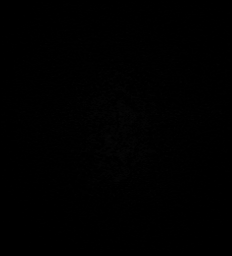

[Series 15: FLAIR · axial · 3.0mm · 0.53mm/px · z∈[-120,+42]mm · 4 of 55 slices shown]
[im 1/55]
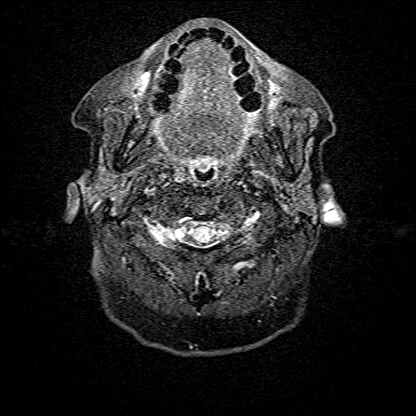
[im 19/55]
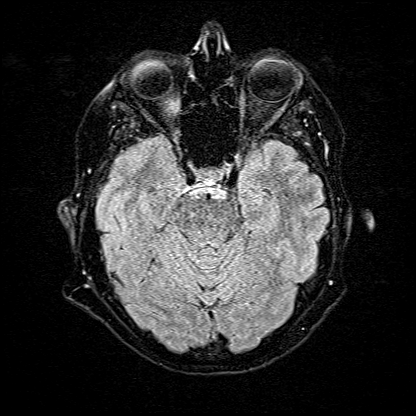
[im 37/55]
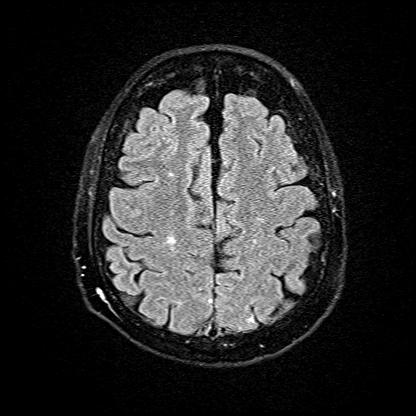
[im 55/55]
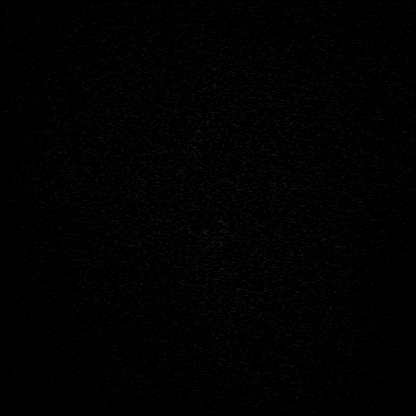

[Series 16: T1 · axial · 1.0mm · 0.98mm/px · z∈[-127,+48]mm · 13 of 176 slices shown (2 of 2)]
[im 1/176]
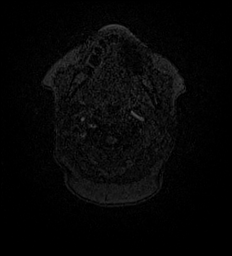
[im 15/176]
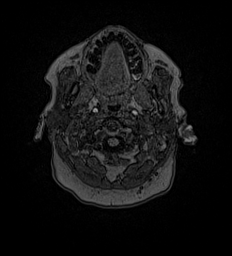
[im 30/176]
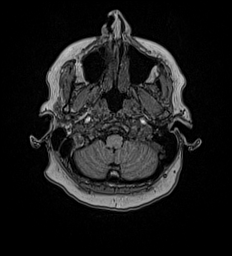
[im 44/176]
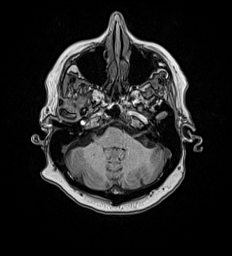
[im 59/176]
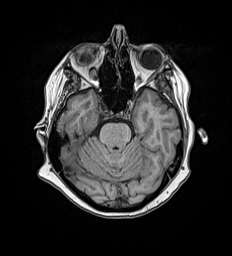
[im 73/176]
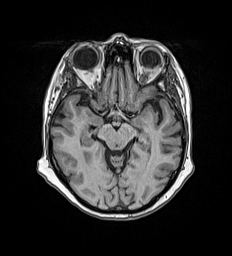
[im 88/176]
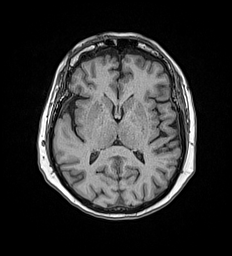
[im 103/176]
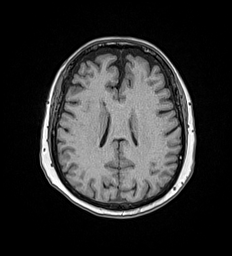
[im 117/176]
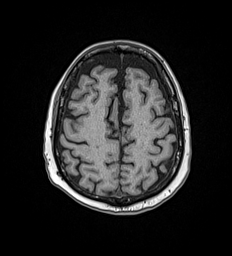
[im 132/176]
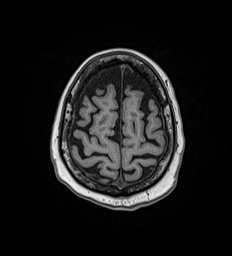
[im 146/176]
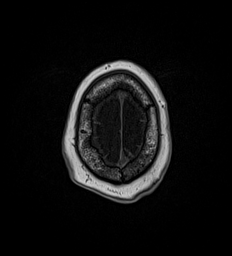
[im 161/176]
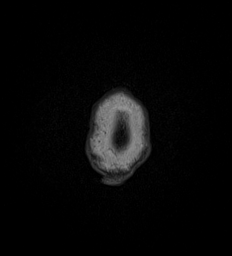
[im 176/176]
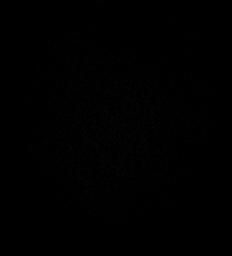

[Series 17: T2 · coronal · 5.0mm · 0.57mm/px · 2 of 29 slices shown (2 of 2)]
[im 1/29]
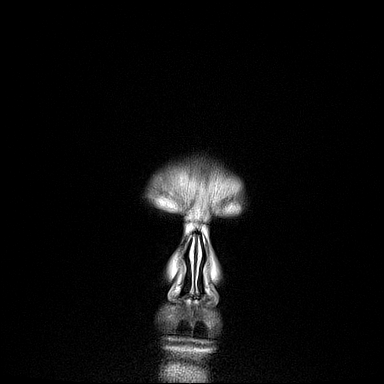
[im 29/29]
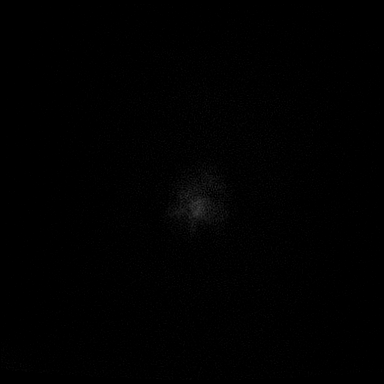

[48 of 48 positions shown; findings below may reference images not displayed]

FINDINGS: Brain: There is no evidence of an acute infarct, mass, midline
shift, or extra-axial fluid collection. Scattered small T2
hyperintensities in the cerebral white matter bilaterally are
nonspecific but compatible with mild chronic small vessel ischemic
disease. There is mild encephalomalacia corresponding to the small
posterior left MCA territory infarcts which were acute on the prior
MRI. Chronic microhemorrhages are again noted posteriorly in both
cerebral hemispheres. A chronic lacunar infarct in the left thalamus
is unchanged from the prior MRI.

Vascular: Major intracranial vascular flow voids are preserved.

Skull and upper cervical spine: Unremarkable bone marrow signal.

Sinuses/Orbits: Bilateral cataract extraction. Clear paranasal
sinuses. Trace left mastoid fluid.

Other: None.
IMPRESSION: 1. No acute intracranial abnormality.
2. Mild chronic small vessel ischemic disease with small chronic
infarcts as above.

## 2020-07-17 NOTE — Telephone Encounter (Signed)
Pt. Reports she had an episode this morning where "my equilibrium went out. I was dizzy and would have fallen if a piece of furniture was not behind me." States she is concerned because of recent strokes this year. Reports "this was more than dizziness." Instructed to call 911 and go to ED. Declines. "I will go if Dr. Ancil Boozer says I need to go." Spoke with Cassandra in the practice and will forward note to PCP.

## 2020-07-17 NOTE — ED Notes (Signed)
MRI screening questions done by MRI at this time.

## 2020-07-17 NOTE — ED Provider Notes (Signed)
MRI without acute findings. Discussed with patient. She continues to feel improved and without any further symptoms. At this time will discharge home. Did discuss follow up with Dr. Manuella Ghazi and return precautions.    Nance Pear, MD 07/17/20 [phone]

## 2020-07-17 NOTE — ED Provider Notes (Signed)
[facility] Emergency Department Provider Note   ____________________________________________    I have reviewed the triage vital signs and the nursing notes.   HISTORY  Chief Complaint Double vision and loss of balance     HPI ELESHIA WOOLEY is a 70 y.o. female with a history of 2 CVAs, one in January of this year and the second in March of this year.  She presents with complaints of double vision this morning followed by loss of balance.  Patient reports she was cleaning the house preparing for her brother to arrive who is visiting from [place].  She felt well this morning prior to the event.  She reports that she developed double vision and had a sudden loss of balance, she fell to her left side but managed to sit down on a bench without injuring herself.  Her daughter helped her get to the couch.  She is feeling better now.  She is concerned because her initial CVA presented with double vision  Past Medical History:  Diagnosis Date   Acute bronchospasm due to viral infection    Anemia    history of   CVA (cerebral vascular accident) ([place]) 02/28/2020   Depressive disorder    Epistaxis    Fatigue    Hearing loss    Hypothyroidism    Memory change    Migraine    Numbness and tingling    Obesity (BMI 30.0-34.9)    Osteoarthritis    Osteoporosis    Polyuria    Premature menopause    Seizures ([place])    history of mini seizures, possible migraine induced    Patient Active Problem List   Diagnosis Date Noted   Acute ITP ([place]) 05/02/2020   Other intra-abdominal and pelvic swelling, mass and lump 04/26/2020   Pelvic mass in female 04/25/2020   Ovarian cancer screening 04/25/2020   Ovarian mass, left 04/25/2020   Stroke (cerebrum) ([place]) 04/20/2020   Acute CVA (cerebrovascular accident) ([place]) 04/19/2020   CVA (cerebral vascular accident) ([place]) 04/19/2020   History of ischemic multifocal posterior circulation stroke 02/29/2020    Thrombocytopenia ([place]) 02/28/2020   HPV test positive 07/08/2015   Difficulty hearing 06/28/2015   Arthritis, degenerative 06/28/2015   Obesity, Class I, BMI 30-34.9 06/28/2015   Paresthesia 06/28/2015   Purpura, 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/2015   Other specified hypothyroidism 08/09/2014   Migraine without aura and without status migrainosus, not intractable 09/19/2008   Moderate major depression ([place]) 09/07/2006   OP (osteoporosis) 09/07/2006    Past Surgical History:  Procedure Laterality Date   ABLATION  2006   BUBBLE STUDY  04/23/2020   Procedure: BUBBLE STUDY;  Surgeon: Larey Dresser, MD;  Location: [place];  Service: Cardiovascular;;   CATARACT EXTRACTION W/ INTRAOCULAR LENS IMPLANT Bilateral    COLONOSCOPY     COLONOSCOPY WITH PROPOFOL N/A 12/22/2018   Procedure: COLONOSCOPY WITH PROPOFOL;  Surgeon: Virgel Manifold, MD;  Location: [facility] ENDOSCOPY;  Service: Endoscopy;  Laterality: N/A;   EYE SURGERY  Spring 2018   Cataracts both eyes   LOOP RECORDER INSERTION N/A 04/23/2020   Procedure: LOOP RECORDER INSERTION;  Surgeon: Evans Lance, MD;  Location: [place] [facility];  Service: Cardiovascular;  Laterality: N/A;   TEE WITHOUT CARDIOVERSION N/A 04/23/2020   Procedure: TRANSESOPHAGEAL ECHOCARDIOGRAM (TEE);  Surgeon: Larey Dresser, MD;  Location: [facility] ENDOSCOPY;  Service: Cardiovascular;  Laterality: N/A;   TONSILLECTOMY      Prior to Admission medications  Medication Sig Start Date End Date Taking? Authorizing Provider  atorvastatin (LIPITOR) 40 MG tablet Take 1 tablet (40 mg total) by mouth daily. 04/11/20   [name], MD  buPROPion (WELLBUTRIN SR) 150 MG 12 hr tablet Take 150 mg by mouth 2 (two) times daily.    Lew Dawes, MD  citalopram (CELEXA) 40 MG tablet Take 40 mg by mouth daily.    Lew Dawes, MD  clindamycin-benzoyl peroxide [facility]) gel Apply topically 2 (two) times daily. Patient taking differently: Apply 1 application topically 2  (two) times daily as needed (facial acne). 05/10/19   [name], MD  clopidogrel (PLAVIX) 75 MG tablet Take 1 tablet (75 mg total) by mouth daily. 04/24/20 04/24/21  Caren Griffins, MD  Cyanocobalamin (VITAMIN B-12) 1000 MCG SUBL Place 1,000 mcg under the tongue at bedtime.    [provider]  levothyroxine (SYNTHROID) 75 MCG tablet Take 1 tablet (75 mcg total) by mouth daily before breakfast. 05/11/20   [name], MD  Multiple Vitamin (MULTIVITAMIN WITH MINERALS) TABS tablet Take 1 tablet by mouth daily.    [provider]  polyvinyl alcohol (LIQUIFILM TEARS) 1.4 % ophthalmic solution Place 1 drop into both eyes daily as needed for dry eyes.    [provider]  rivaroxaban (XARELTO) 20 MG TABS tablet Take 20 mg by mouth daily with supper.    [provider]  topiramate (TOPAMAX) 25 MG tablet Take 25 mg by mouth daily.     Lew Dawes, MD  Vitamin D, Ergocalciferol, (DRISDOL) 1.25 MG (50000 UNIT) CAPS capsule TAKE 1 CAPSULE BY MOUTH EVERY 7 DAYS Patient taking differently: Take 50,000 Units by mouth every Tuesday. 04/02/20   [name], MD     Allergies Sulfamethoxazole-trimethoprim  Family History  Problem Relation Age of Onset   Early death Father        suicide   Depression Father    Diabetes Father    Cancer Father        prostate   Hearing loss Father        due to war   Alzheimer's disease Mother    Heart disease Brother    Atrial fibrillation Brother    Heart disease Maternal Aunt    Dementia Maternal Aunt    Heart disease Maternal Uncle    Dementia Maternal Grandmother    Heart disease Brother    Atrial fibrillation Brother    Colon cancer Neg Hx    Breast cancer Neg Hx    Ovarian cancer Neg Hx    Pancreatic cancer Neg Hx    Endometrial cancer Neg Hx     Social History Social History   Tobacco Use   Smoking status: Former    Packs/day: 0.50    Years: 6.00    Pack years: 3.00    Types: Cigarettes    Quit  date: 02/11/1974    Years since quitting: 46.4   Smokeless tobacco: Never  Vaping Use   Vaping Use: Never used  Substance Use Topics   Alcohol use: Yes    Alcohol/week: 0.0 standard drinks    Comment: 2-3 drinks monthly   Drug use: Never    Review of Systems  Constitutional: No fever/chills Eyes: No visual changes.  ENT: No sore throat. Cardiovascular: Denies chest pain. Respiratory: Denies shortness of breath. Gastrointestinal: No abdominal pain.   Genitourinary: Negative for dysuria. Musculoskeletal: Negative for back pain. Skin: Negative for rash. Neurological: As above   ____________________________________________  PHYSICAL EXAM:  VITAL SIGNS: ED Triage Vitals  Enc Vitals Group     BP 07/17/20 1130 114/75     Pulse Rate 07/17/20 1130 72     Resp 07/17/20 1130 18     Temp 07/17/20 1130 98.4 F (36.9 C)     Temp src --      SpO2 07/17/20 1130 99 %     Weight --      Height --      Head Circumference --      Peak Flow --      Pain Score 07/17/20 1129 0     Pain Loc --      Pain Edu? --      Excl. in [place]? --     Constitutional: Alert and oriented.  Eyes: Conjunctivae are normal.  PERRLA, EOMI Head: Atraumatic. Nose: No congestion/rhinnorhea. Mouth/Throat: Mucous membranes are moist.    Cardiovascular: Normal rate, regular rhythm. Grossly normal heart sounds.  Good peripheral circulation. Respiratory: Normal respiratory effort.  No retractions. Lungs CTAB. Gastrointestinal: Soft and nontender. No distention.    Musculoskeletal: No lower extremity tenderness nor edema.  Warm and well perfused Neurologic:  Normal speech and language. No gross focal neurologic deficits are appreciated.  Normal strength in all extremities, cranial nerves II through XII are normal Skin:  Skin is warm, dry and intact. No rash noted. Psychiatric: Mood and affect are normal. Speech and behavior are normal.  ____________________________________________   LABS (all labs  ordered are listed, but only abnormal results are displayed)  Labs Reviewed  BASIC METABOLIC PANEL - Abnormal; Notable for the following components:      Result Value   Glucose, Bld 104 (*)    All other components within normal limits  CBC  URINALYSIS, COMPLETE (UACMP) WITH MICROSCOPIC  CBG MONITORING, ED   ____________________________________________  EKG  ED ECG REPORT I, Lavonia Drafts, the attending physician, personally viewed and interpreted this ECG.  Date: 07/17/2020  Rhythm: normal sinus rhythm QRS Axis: normal Intervals: normal ST/T Wave abnormalities: Nonspecific Narrative Interpretation: no evidence of acute ischemia  ____________________________________________  RADIOLOGY  CT head unremarkable ____________________________________________   PROCEDURES  Procedure(s) performed: No  Procedures   Critical Care performed: No ____________________________________________   INITIAL IMPRESSION / ASSESSMENT AND PLAN / ED COURSE  Pertinent labs & imaging results that were available during my care of the patient were reviewed by me and considered in my medical decision making (see chart for details).   Patient presents with episode of double vision and loss of balance as noted above, 2 CVAs in the past 5 months.  She does take Xarelto.  She reports she accidentally took 2 of her morning meds on Sunday so on Monday she only took her evening meds.  She is well-appearing and asymptomatic at this time.  Presentation could be indicative of TIA, CT head, lab work unremarkable.  Will send for MRI brain, if normal anticipate discharge with outpatient follow-up with Dr. Brigitte Pulse her neurologist    ____________________________________________   FINAL CLINICAL IMPRESSION(S) / ED DIAGNOSES  Final diagnoses:  None        Note:  This document was prepared using Dragon voice recognition software and may include unintentional dictation errors.    Lavonia Drafts,  MD 07/17/20 1453

## 2020-07-17 NOTE — ED Triage Notes (Addendum)
Pt comes with c/o near syncopal episode this am. Pt states little dizziness. Pt denies any pain.  Pt denies any SOB or CP.  Pt states something weird with her eyes earlier . Maybe double vision. Pt states hx of strokes in past.  Pt denies any current symptoms.

## 2020-07-17 NOTE — Telephone Encounter (Signed)
Reason for Disposition  Sounds like a life-threatening emergency to the triager  Answer Assessment - Initial Assessment Questions 1. DESCRIPTION: "Describe your dizziness."     Dizzy 2. LIGHTHEADED: "Do you feel lightheaded?" (e.g., somewhat faint, woozy, weak upon standing)     Yes 3. VERTIGO: "Do you feel like either you or the room is spinning or tilting?" (i.e. vertigo)     No 4. SEVERITY: "How bad is it?"  "Do you feel like you are going to faint?" "Can you stand and walk?"   - MILD: Feels slightly dizzy, but walking normally.   - MODERATE: Feels unsteady when walking, but not falling; interferes with normal activities (e.g., school, work).   - SEVERE: Unable to walk without falling, or requires assistance to walk without falling; feels like passing out now.      Moderate 5. ONSET:  "When did the dizziness begin?"     Today 6. AGGRAVATING FACTORS: "Does anything make it worse?" (e.g., standing, change in head position)     No 7. HEART RATE: "Can you tell me your heart rate?" "How many beats in 15 seconds?"  (Note: not all patients can do this)       No 8. CAUSE: "What do you think is causing the dizziness?"     Unsure 9. RECURRENT SYMPTOM: "Have you had dizziness before?" If Yes, ask: "When was the last time?" "What happened that time?"     Yes 10. OTHER SYMPTOMS: "Do you have any other symptoms?" (e.g., fever, chest pain, vomiting, diarrhea, bleeding)       Off balance 11. PREGNANCY: "Is there any chance you are pregnant?" "When was your last menstrual period?"       No  Protocols used: Dizziness - Lightheadedness-A-AH

## 2020-07-17 NOTE — Telephone Encounter (Signed)
Copied from [place] [id number]. Topic: General - Other >> Jul 17, 2020 11:17 AM Keene Breath wrote: Reason for CRM: Patient called again to inform the doctor that she has gone to the ER and does not need a call back now.

## 2020-07-17 NOTE — Discharge Instructions (Addendum)
Please seek medical attention for any high fevers, chest pain, shortness of breath, change in behavior, persistent vomiting, bloody stool or any other new or concerning symptoms.  

## 2020-07-17 NOTE — ED Notes (Signed)
Patient transported to MRI 

## 2020-07-17 NOTE — ED Notes (Signed)
Patient provided discharge instructions, including follow up care and follow up appointments. Patient verbalized understanding all all instructions provided. Patient ambulatory to wheelchair. Patient taken to lobby to wait on ride via POV. Patient alert, oriented x4, with no complaints at discharge.

## 2020-07-17 NOTE — ED Notes (Signed)
Patient reports inability to provide urine sample at this time. Patient aware of sample ordered by MD.

## 2020-07-19 ENCOUNTER — Ambulatory Visit: Payer: Medicare Other

## 2020-07-24 ENCOUNTER — Ambulatory Visit: Payer: Medicare Other

## 2020-07-24 ENCOUNTER — Other Ambulatory Visit: Payer: Self-pay

## 2020-07-24 DIAGNOSIS — I63412 Cerebral infarction due to embolism of left middle cerebral artery: Secondary | ICD-10-CM | POA: Diagnosis not present

## 2020-07-24 DIAGNOSIS — R2681 Unsteadiness on feet: Secondary | ICD-10-CM | POA: Diagnosis not present

## 2020-07-24 DIAGNOSIS — R278 Other lack of coordination: Secondary | ICD-10-CM | POA: Diagnosis not present

## 2020-07-24 DIAGNOSIS — R2689 Other abnormalities of gait and mobility: Secondary | ICD-10-CM | POA: Diagnosis not present

## 2020-07-24 DIAGNOSIS — M6281 Muscle weakness (generalized): Secondary | ICD-10-CM | POA: Diagnosis not present

## 2020-07-24 NOTE — Therapy (Signed)
[place] MAIN [facility] SERVICES [address] [place], [place], [address] Phone: [phone]   Fax:  [phone]  Physical Therapy Treatment  Patient Details  Name: Natalie [name] MRN: [id number] Date of Birth: 13-Apr-1950 Referring Provider (PT): [name], MD   Encounter Date: 07/24/2020   PT End of Session - 07/25/20 1333     Visit Number 8    Number of Visits 25    Date for PT Re-Evaluation 08/13/20    Authorization Type Traditional Medicare with [employer] Supplemental: VL based on certification    Authorization Time Period 05/21/20-08/13/20    PT Start Time 1518    PT Stop Time 1600    PT Time Calculation (min) 42 min    Equipment Utilized During Treatment Gait belt    Activity Tolerance Patient tolerated treatment well    Behavior During Therapy [facility] for tasks assessed/performed             Past Medical History:  Diagnosis Date   Acute bronchospasm due to viral infection    Anemia    history of   CVA (cerebral vascular accident) ([place]) 02/28/2020   Depressive disorder    Epistaxis    Fatigue    Hearing loss    Hypothyroidism    Memory change    Migraine    Numbness and tingling    Obesity (BMI 30.0-34.9)    Osteoarthritis    Osteoporosis    Polyuria    Premature menopause    Seizures ([place])    history of mini seizures, possible migraine induced    Past Surgical History:  Procedure Laterality Date   ABLATION  2006   BUBBLE STUDY  04/23/2020   Procedure: BUBBLE STUDY;  Surgeon: Larey Dresser, MD;  Location: [place];  Service: Cardiovascular;;   CATARACT EXTRACTION W/ INTRAOCULAR LENS IMPLANT Bilateral    COLONOSCOPY     COLONOSCOPY WITH PROPOFOL N/A 12/22/2018   Procedure: COLONOSCOPY WITH PROPOFOL;  Surgeon: Virgel Manifold, MD;  Location: [facility] ENDOSCOPY;  Service: Endoscopy;  Laterality: N/A;   EYE SURGERY  Spring 2018   Cataracts both eyes   LOOP RECORDER INSERTION N/A 04/23/2020   Procedure:  LOOP RECORDER INSERTION;  Surgeon: Evans Lance, MD;  Location: [place] CV LAB;  Service: Cardiovascular;  Laterality: N/A;   TEE WITHOUT CARDIOVERSION N/A 04/23/2020   Procedure: TRANSESOPHAGEAL ECHOCARDIOGRAM (TEE);  Surgeon: Larey Dresser, MD;  Location: [facility] ENDOSCOPY;  Service: Cardiovascular;  Laterality: N/A;   TONSILLECTOMY      There were no vitals filed for this visit.  Subjective Assessment - 07/24/20 1520     Subjective Pt reports she missed last PT appointment because she went to ER following double vision and losing control of her balance. Pt says her daughter was home with her and able to help her. Pt was able to fall into a sitting position at the time. She said the double vision resolved almost immediately. Pt says she wasn't dizzy, but that she felt she had no control of her balance. Her daughter took her to the ER.  She says imaging completed in ER didn't show anything.    Pertinent History Pt confirms the following hx is accurate (initially from acute care PT eval): "the pt is a 70 yo female presenting 3/17 with confusion and difficulty word-finding. Imaging revealed L MCA infarcts with nearly occlusive thrombus in proximal M2 and M3." PMH includes the following: CVA in Jan 2022 (for further details see  ACUTE PT eval in EMR from 02/29/2020), anxiety, hypothyroidism, depression, OA, osteoporosis, memory change, migraine, seizures, hearing loss.    Limitations Standing;Walking;Lifting;House hold activities;Writing   she says she limits how heavy she lifts due to hesitation   How long can you sit comfortably? not limited    How long can you stand comfortably? Pt reports she doesn't like to stand still for very long    How long can you walk comfortably? Difficulty with balance when walking over uneven ground, but states she is able to walk a mile. She says sometimes she gets tired walking and will sit down. She feels overall she's been fairly energetic.    Diagnostic tests per  chart: "[place] 04/19/20: MR BRAIN IMPRESSION:  1. Patchy small volume acute ischemic nonhemorrhagic posterior left  MCA distribution infarcts, likely reflecting an embolic shower. No  associated hemorrhage or mass effect.  2. No other acute intracranial abnormality.  3. Underlying mild chronic microvascular ischemic disease.; 3/17 CT ANGIO HEAD NECK:  IMPRESSION:  Nearly occlusive thrombus at the bifurcation of a proximal M2 MCA  branch approximately 6 mm from the MCA bifurcation. Subsequent  occlusion of a proximal left M3 MCA branch. No stenosis in the neck."    Patient Stated Goals Pt says she wants to improve her balance and does not want to fall again    Currently in Pain? No/denies            TREATMENT  Neuro Re-ed: close CGA for all of the following  SLB - 2x30 sec BLEs; intermittent UE support  Tandem stance - 2x30 sec BLES 2x30   Standing lunges with leading foot on airex pad - 2x30-45 sec BLE; UUE support.  Forward/backward stepping over hurdle -2x20; intermittent UE support   Side-to-side stepping over hurdle - 2x15  Cross-over stepping at support surface - x multiple times length of bars, requires up to min assist and intermittent UE support with initial reps.  Ankle rockers on half foam - 2x20 -frequent use of finger-touch support on bar  Standing on foam NBOS EC - 60 sec  Standing on foam NBOS EC with vertical, horizontal head turns, 30 sec for each; pt exhibits good postural control    PT Short Term Goals - 05/21/20 1639       PT SHORT TERM GOAL #1   Title Pt will be independent with HEP in order to improve strength and balance in order to decrease fall risk and improve function at home and work.    Baseline 4/18: to be initiated    Time 6    Period Weeks    Status New    Target Date 07/02/20               PT Long Term Goals - 05/30/20 1553       PT LONG TERM GOAL #1   Title Pt will improve DGI by at least 3 points in order to demonstrate clinically  significant improvement in balance and decreased risk for falls.    Baseline 4/18: 18/24    Time 12    Period Weeks    Status New    Target Date 08/13/20      PT LONG TERM GOAL #2   Title Pt will improve ABC scale by at least 13% in order to demonstrate clinically significant improvement in balance confidence.    Baseline 4/18: 63.75%    Time 12    Period Weeks    Status New    Target  Date 08/13/20      PT LONG TERM GOAL #3   Title Pt will demonstrate improved ability to maintain balance on compliant surface with EC for at least 5 seconds to indicate improved ability to navigate uneven surfaces and ambulate in dimly lit rooms.    Baseline 4/18: Pt unable to maintain balance with EC on foam on CTSIB-M    Time 12    Period Weeks    Status New    Target Date 08/13/20      PT LONG TERM GOAL #4   Title Patient will increase BLE gross strength to 4+/5 as to improve functional strength for independent gait, increased standing tolerance and increased ADL ability.    Baseline 4/18: gross BLE strength currnelty 4/5    Time 12    Period Weeks    Status New    Target Date 08/13/20      PT LONG TERM GOAL #5   Title Patient will increase FOTO score to equal to or greater than 80 to demonstrate improvement in mobility and quality of life.    Baseline 4/18: 74%    Time 12    Period Weeks    Status New    Target Date 08/13/20      PT LONG TERM GOAL #6   Title Patient will reduce dizziness handicap inventory score to <10, for less dizziness with ADLs and increased safety with home and work tasks.    Baseline 4/27: DHI score is 16, indicating mild handicap    Time 11    Period Weeks    Status New    Target Date 08/13/20                   Plan - 07/24/20 1524     Clinical Impression Statement Pt challenged with all balance tasks today, both static and dynamic. Pt has greatest difficulty with postural stability with lateral movements, such as with side-to-side stepping over a  hurdle. Pt did exhibit good postural control with EC on compliant surface with addition of head turns. The pt will benefit from further skilled PT to improve gait and balance in order to decrease fall risk.    Personal Factors and Comorbidities Age;Comorbidity 1;Comorbidity 2;Past/Current Experience;Sex;Social Background;Behavior Pattern;Comorbidity 3+    Comorbidities CVA in Jan 2022, anxiety, hypothyroidism, depression, OA, osteoporosis, memory change, migraine, seizures, hearing loss.    Examination-Activity Limitations Bathing;Stairs;Stand;Hygiene/Grooming;Locomotion Level;Carry;Bed Mobility;Other;Reach Overhead;Lift    Examination-Participation Restrictions Community Activity;Yard Work;Shop;Cleaning;Laundry;Medication Management;Meal Prep    Stability/Clinical Decision Making Evolving/Moderate complexity    Rehab Potential Good    PT Frequency 2x / week    PT Duration 12 weeks    PT Treatment/Interventions ADLs/Self Care Home Management;Biofeedback;Canalith Repostioning;Cryotherapy;Electrical Stimulation;Aquatic Therapy;Moist Heat;Ultrasound;DME Instruction;Gait training;Stair training;Functional mobility training;Therapeutic activities;Therapeutic exercise;Balance training;Neuromuscular re-education;Cognitive remediation;Patient/family education;Orthotic Fit/Training;Manual techniques;Passive range of motion;Energy conservation;Taping;Splinting;Vestibular;Visual/perceptual remediation/compensation;Joint Manipulations    PT Next Visit Plan higher level balance assessment, DHI, dynamic balance, floor transfers, continue generalized strengthening    PT Home Exercise Plan Access Code: [email or web address], added:  Access Code: [email or web address]    Consulted and Agree with Plan of Care Patient             Patient will benefit from skilled therapeutic intervention in order to improve the following deficits and impairments:  Abnormal gait, Decreased balance, Decreased endurance, Difficulty walking, Decreased range of  motion, Dizziness, Improper body mechanics, Impaired vision/preception, Decreased coordination, Decreased strength, Decreased mobility, Obesity, Decreased activity tolerance, Decreased knowledge of use of DME, Impaired flexibility, Postural dysfunction  Visit Diagnosis: Unsteadiness on feet  Other abnormalities of gait and mobility     Problem List Patient Active Problem List   Diagnosis Date Noted   Acute ITP ([place]) 05/02/2020   Other intra-abdominal and pelvic swelling, mass and lump 04/26/2020   Pelvic mass in female 04/25/2020   Ovarian cancer screening 04/25/2020   Ovarian mass, left 04/25/2020   Stroke (cerebrum) ([place]) 04/20/2020   Acute CVA (cerebrovascular accident) ([place]) 04/19/2020   CVA (cerebral vascular accident) ([place]) 04/19/2020   History of ischemic multifocal posterior circulation stroke 02/29/2020   Thrombocytopenia ([place]) 02/28/2020   HPV test positive 07/08/2015   Difficulty hearing 06/28/2015   Arthritis, degenerative 06/28/2015   Obesity, Class I, BMI 30-34.9 06/28/2015   Paresthesia 06/28/2015   Purpura, nonthrombopenic ([place]) 06/28/2015   Other specified hypothyroidism 08/09/2014   Migraine without aura and without status migrainosus, not intractable 09/19/2008   Moderate major depression ([place]) 09/07/2006   OP (osteoporosis) 09/07/2006   Ricard Dillon PT, DPT 07/25/2020, 4:59 PM  [place] MAIN [facility] SERVICES [address] [place], [place], [address] Phone: [phone]   Fax:  [phone]  Name: Natalie [name] MRN: [id number] Date of Birth: Sep 02, 1950

## 2020-07-25 NOTE — Progress Notes (Signed)
[facility] Summary Report / Loop Recorder 

## 2020-07-26 ENCOUNTER — Ambulatory Visit: Payer: Medicare Other

## 2020-07-26 ENCOUNTER — Other Ambulatory Visit: Payer: Self-pay

## 2020-07-26 DIAGNOSIS — R2689 Other abnormalities of gait and mobility: Secondary | ICD-10-CM | POA: Diagnosis not present

## 2020-07-26 DIAGNOSIS — R2681 Unsteadiness on feet: Secondary | ICD-10-CM | POA: Diagnosis not present

## 2020-07-26 DIAGNOSIS — Z8669 Personal history of other diseases of the nervous system and sense organs: Secondary | ICD-10-CM | POA: Diagnosis not present

## 2020-07-26 DIAGNOSIS — M6281 Muscle weakness (generalized): Secondary | ICD-10-CM | POA: Diagnosis not present

## 2020-07-26 DIAGNOSIS — Z79899 Other long term (current) drug therapy: Secondary | ICD-10-CM | POA: Diagnosis not present

## 2020-07-26 DIAGNOSIS — R278 Other lack of coordination: Secondary | ICD-10-CM | POA: Diagnosis not present

## 2020-07-26 DIAGNOSIS — A539 Syphilis, unspecified: Secondary | ICD-10-CM | POA: Diagnosis not present

## 2020-07-26 DIAGNOSIS — I63412 Cerebral infarction due to embolism of left middle cerebral artery: Secondary | ICD-10-CM | POA: Diagnosis not present

## 2020-07-26 DIAGNOSIS — Z8673 Personal history of transient ischemic attack (TIA), and cerebral infarction without residual deficits: Secondary | ICD-10-CM | POA: Diagnosis not present

## 2020-07-26 DIAGNOSIS — E559 Vitamin D deficiency, unspecified: Secondary | ICD-10-CM | POA: Diagnosis not present

## 2020-07-26 NOTE — Therapy (Signed)
[place] MAIN [facility] SERVICES [address] [place], [place], [address] Phone: [phone]   Fax:  [phone]  Physical Therapy Treatment  Patient Details  Name: Natalie [name] MRN: [id number] Date of Birth: 26-Sep-1950 Referring Provider (PT): [name], MD   Encounter Date: 07/26/2020   PT End of Session - 07/26/20 1614     Visit Number 9    Number of Visits 25    Date for PT Re-Evaluation 08/13/20    Authorization Type Traditional Medicare with [employer] Supplemental: VL based on certification    Authorization Time Period 05/21/20-08/13/20    PT Start Time 1521    PT Stop Time 1602    PT Time Calculation (min) 41 min    Equipment Utilized During Treatment Gait belt    Activity Tolerance Patient tolerated treatment well;Patient limited by fatigue    Behavior During Therapy [facility] for tasks assessed/performed             Past Medical History:  Diagnosis Date   Acute bronchospasm due to viral infection    Anemia    history of   CVA (cerebral vascular accident) ([place]) 02/28/2020   Depressive disorder    Epistaxis    Fatigue    Hearing loss    Hypothyroidism    Memory change    Migraine    Numbness and tingling    Obesity (BMI 30.0-34.9)    Osteoarthritis    Osteoporosis    Polyuria    Premature menopause    Seizures ([place])    history of mini seizures, possible migraine induced    Past Surgical History:  Procedure Laterality Date   ABLATION  2006   BUBBLE STUDY  04/23/2020   Procedure: BUBBLE STUDY;  Surgeon: Larey Dresser, MD;  Location: [place];  Service: Cardiovascular;;   CATARACT EXTRACTION W/ INTRAOCULAR LENS IMPLANT Bilateral    COLONOSCOPY     COLONOSCOPY WITH PROPOFOL N/A 12/22/2018   Procedure: COLONOSCOPY WITH PROPOFOL;  Surgeon: Virgel Manifold, MD;  Location: [facility] ENDOSCOPY;  Service: Endoscopy;  Laterality: N/A;   EYE SURGERY  Spring 2018   Cataracts both eyes   LOOP RECORDER INSERTION N/A  04/23/2020   Procedure: LOOP RECORDER INSERTION;  Surgeon: Evans Lance, MD;  Location: [place] CV LAB;  Service: Cardiovascular;  Laterality: N/A;   TEE WITHOUT CARDIOVERSION N/A 04/23/2020   Procedure: TRANSESOPHAGEAL ECHOCARDIOGRAM (TEE);  Surgeon: Larey Dresser, MD;  Location: [facility] ENDOSCOPY;  Service: Cardiovascular;  Laterality: N/A;   TONSILLECTOMY      There were no vitals filed for this visit.   Subjective Assessment - 07/26/20 1521     Subjective Pt reports she did not sleep well last night. Pt followed up with PA today and she says she felt unsteady while walking in her sandals at her appointment. Pt thinks she may have the beginning of HA currently.    Pertinent History Pt confirms the following hx is accurate (initially from acute care PT eval): "the pt is a 70 yo female presenting 3/17 with confusion and difficulty word-finding. Imaging revealed L MCA infarcts with nearly occlusive thrombus in proximal M2 and M3." PMH includes the following: CVA in Jan 2022 (for further details see ACUTE PT eval in EMR from 02/29/2020), anxiety, hypothyroidism, depression, OA, osteoporosis, memory change, migraine, seizures, hearing loss.    Limitations Standing;Walking;Lifting;House hold activities;Writing   she says she limits how heavy she lifts due to hesitation   How long can you  sit comfortably? not limited    How long can you stand comfortably? Pt reports she doesn't like to stand still for very long    How long can you walk comfortably? Difficulty with balance when walking over uneven ground, but states she is able to walk a mile. She says sometimes she gets tired walking and will sit down. She feels overall she's been fairly energetic.    Diagnostic tests per chart: "[place] 04/19/20: MR BRAIN IMPRESSION:  1. Patchy small volume acute ischemic nonhemorrhagic posterior left  MCA distribution infarcts, likely reflecting an embolic shower. No  associated hemorrhage or mass effect.  2. No other  acute intracranial abnormality.  3. Underlying mild chronic microvascular ischemic disease.; 3/17 CT ANGIO HEAD NECK:  IMPRESSION:  Nearly occlusive thrombus at the bifurcation of a proximal M2 MCA  branch approximately 6 mm from the MCA bifurcation. Subsequent  occlusion of a proximal left M3 MCA branch. No stenosis in the neck."    Patient Stated Goals Pt says she wants to improve her balance and does not want to fall again    Currently in Pain? Yes   headache            TREATMENT  Neuro Re-ed: close CGA for all of the following  Amb. Down hallway reading stick notes (horizontal head turns) - no decrease in postural stability, no LOB  Amb. With vertical, horizontal head turns, cuing for heel strike  Standing lunges with leading foot on airex pad - 2x30-45 sec BLE; UUE support. Progressed to EC 2x30 sec BLEs, no UE support, did require some cuing for weight shift to R.  SLB - 4x30 sec BLEs; today requires frequent intermittent UE support  Tandem stance - 2x30 sec BLES 2x30   Tandem stance with vertical (10x) horizontal head turns (10x each direction with each LE as primary stance RE).  Side-to-side stepping over hurdle - 15x      PT Education - 07/26/20 1614     Education Details exercise technique, body mechanics with horizontal/vertical head turn exercises    Person(s) Educated Patient    Methods Explanation;Demonstration;Verbal cues    Comprehension Verbalized understanding;Returned demonstration              PT Short Term Goals - 05/21/20 1639       PT SHORT TERM GOAL #1   Title Pt will be independent with HEP in order to improve strength and balance in order to decrease fall risk and improve function at home and work.    Baseline 4/18: to be initiated    Time 6    Period Weeks    Status New    Target Date 07/02/20               PT Long Term Goals - 05/30/20 1553       PT LONG TERM GOAL #1   Title Pt will improve DGI by at least 3 points in order  to demonstrate clinically significant improvement in balance and decreased risk for falls.    Baseline 4/18: 18/24    Time 12    Period Weeks    Status New    Target Date 08/13/20      PT LONG TERM GOAL #2   Title Pt will improve ABC scale by at least 13% in order to demonstrate clinically significant improvement in balance confidence.    Baseline 4/18: 63.75%    Time 12    Period Weeks    Status New  Target Date 08/13/20      PT LONG TERM GOAL #3   Title Pt will demonstrate improved ability to maintain balance on compliant surface with EC for at least 5 seconds to indicate improved ability to navigate uneven surfaces and ambulate in dimly lit rooms.    Baseline 4/18: Pt unable to maintain balance with EC on foam on CTSIB-M    Time 12    Period Weeks    Status New    Target Date 08/13/20      PT LONG TERM GOAL #4   Title Patient will increase BLE gross strength to 4+/5 as to improve functional strength for independent gait, increased standing tolerance and increased ADL ability.    Baseline 4/18: gross BLE strength currnelty 4/5    Time 12    Period Weeks    Status New    Target Date 08/13/20      PT LONG TERM GOAL #5   Title Patient will increase FOTO score to equal to or greater than 80 to demonstrate improvement in mobility and quality of life.    Baseline 4/18: 74%    Time 12    Period Weeks    Status New    Target Date 08/13/20      PT LONG TERM GOAL #6   Title Patient will reduce dizziness handicap inventory score to <10, for less dizziness with ADLs and increased safety with home and work tasks.    Baseline 4/27: DHI score is 16, indicating mild handicap    Time 11    Period Weeks    Status New    Target Date 08/13/20                   Plan - 07/26/20 1615     Clinical Impression Statement Pt slightly limited this session secondary to fatigue from poor sleep and developing HA. Pt denied dizzines with headache and reported it felt that she had  muscular tension in region around eye, neck and shoulder on R side. She reports this is how her migraines can start. PT instructed pt in seeking care if she develops further difficulty with balance, dizziness, and visual disturbance. Pt verbalized understanding and reported this did not feel similar to recent sx that brought her to ER. Pt did exhibit some progress, however, with ambulating with vertical, horizontal head turns without decrease in postural stability or LOB. The pt will benefit from further skilled PT to improve strength, balance and gait in order to increase ease and safety with all functional mobility.    Personal Factors and Comorbidities Age;Comorbidity 1;Comorbidity 2;Past/Current Experience;Sex;Social Background;Behavior Pattern;Comorbidity 3+    Comorbidities CVA in Jan 2022, anxiety, hypothyroidism, depression, OA, osteoporosis, memory change, migraine, seizures, hearing loss.    Examination-Activity Limitations Bathing;Stairs;Stand;Hygiene/Grooming;Locomotion Level;Carry;Bed Mobility;Other;Reach Overhead;Lift    Examination-Participation Restrictions Community Activity;Yard Work;Shop;Cleaning;Laundry;Medication Management;Meal Prep    Stability/Clinical Decision Making Evolving/Moderate complexity    Rehab Potential Good    PT Frequency 2x / week    PT Duration 12 weeks    PT Treatment/Interventions ADLs/Self Care Home Management;Biofeedback;Canalith Repostioning;Cryotherapy;Electrical Stimulation;Aquatic Therapy;Moist Heat;Ultrasound;DME Instruction;Gait training;Stair training;Functional mobility training;Therapeutic activities;Therapeutic exercise;Balance training;Neuromuscular re-education;Cognitive remediation;Patient/family education;Orthotic Fit/Training;Manual techniques;Passive range of motion;Energy conservation;Taping;Splinting;Vestibular;Visual/perceptual remediation/compensation;Joint Manipulations    PT Next Visit Plan higher level balance assessment, DHI, dynamic  balance, floor transfers, continue generalized strengthening, gait mechanics    PT Home Exercise Plan Access Code: [email or web address], added:  Access Code: [email or web address]    Consulted and Agree with Plan of Care Patient  Patient will benefit from skilled therapeutic intervention in order to improve the following deficits and impairments:  Abnormal gait, Decreased balance, Decreased endurance, Difficulty walking, Decreased range of motion, Dizziness, Improper body mechanics, Impaired vision/preception, Decreased coordination, Decreased strength, Decreased mobility, Obesity, Decreased activity tolerance, Decreased knowledge of use of DME, Impaired flexibility, Postural dysfunction  Visit Diagnosis: Unsteadiness on feet  Other abnormalities of gait and mobility  Muscle weakness (generalized)     Problem List Patient Active Problem List   Diagnosis Date Noted   Acute ITP ([place]) 05/02/2020   Other intra-abdominal and pelvic swelling, mass and lump 04/26/2020   Pelvic mass in female 04/25/2020   Ovarian cancer screening 04/25/2020   Ovarian mass, left 04/25/2020   Stroke (cerebrum) ([place]) 04/20/2020   Acute CVA (cerebrovascular accident) ([place]) 04/19/2020   CVA (cerebral vascular accident) ([place]) 04/19/2020   History of ischemic multifocal posterior circulation stroke 02/29/2020   Thrombocytopenia ([place]) 02/28/2020   HPV test positive 07/08/2015   Difficulty hearing 06/28/2015   Arthritis, degenerative 06/28/2015   Obesity, Class I, BMI 30-34.9 06/28/2015   Paresthesia 06/28/2015   Purpura, nonthrombopenic ([place]) 06/28/2015   Other specified hypothyroidism 08/09/2014   Migraine without aura and without status migrainosus, not intractable 09/19/2008   Moderate major depression ([place]) 09/07/2006   OP (osteoporosis) 09/07/2006   Ricard Dillon PT, DPT 07/26/2020, 4:29 PM  [place] MAIN [facility] SERVICES [address] [place], [place], [address] Phone:  [phone]   Fax:  [phone]  Name: Natalie [name] MRN: [id number] Date of Birth: 11-Jan-1951

## 2020-07-31 ENCOUNTER — Ambulatory Visit: Payer: Medicare Other

## 2020-08-02 ENCOUNTER — Ambulatory Visit: Payer: Medicare Other

## 2020-08-05 ENCOUNTER — Emergency Department: Payer: Medicare Other

## 2020-08-05 ENCOUNTER — Encounter: Payer: Self-pay | Admitting: Emergency Medicine

## 2020-08-05 ENCOUNTER — Emergency Department
Admission: EM | Admit: 2020-08-05 | Discharge: 2020-08-05 | Disposition: A | Discharge status: Home / Self Care | Payer: Medicare Other | Attending: Emergency Medicine | Admitting: Emergency Medicine

## 2020-08-05 ENCOUNTER — Other Ambulatory Visit: Payer: Self-pay

## 2020-08-05 DIAGNOSIS — S199XXA Unspecified injury of neck, initial encounter: Secondary | ICD-10-CM | POA: Diagnosis not present

## 2020-08-05 DIAGNOSIS — Z7901 Long term (current) use of anticoagulants: Secondary | ICD-10-CM | POA: Diagnosis not present

## 2020-08-05 DIAGNOSIS — Z7902 Long term (current) use of antithrombotics/antiplatelets: Secondary | ICD-10-CM | POA: Diagnosis not present

## 2020-08-05 DIAGNOSIS — W01198A Fall on same level from slipping, tripping and stumbling with subsequent striking against other object, initial encounter: Secondary | ICD-10-CM | POA: Insufficient documentation

## 2020-08-05 DIAGNOSIS — Z87891 Personal history of nicotine dependence: Secondary | ICD-10-CM | POA: Diagnosis not present

## 2020-08-05 DIAGNOSIS — S0083XA Contusion of other part of head, initial encounter: Secondary | ICD-10-CM | POA: Diagnosis not present

## 2020-08-05 DIAGNOSIS — E039 Hypothyroidism, unspecified: Secondary | ICD-10-CM | POA: Diagnosis not present

## 2020-08-05 DIAGNOSIS — R55 Syncope and collapse: Secondary | ICD-10-CM

## 2020-08-05 DIAGNOSIS — M542 Cervicalgia: Secondary | ICD-10-CM | POA: Insufficient documentation

## 2020-08-05 DIAGNOSIS — Z79899 Other long term (current) drug therapy: Secondary | ICD-10-CM | POA: Diagnosis not present

## 2020-08-05 DIAGNOSIS — Z20822 Contact with and (suspected) exposure to covid-19: Secondary | ICD-10-CM | POA: Diagnosis not present

## 2020-08-05 DIAGNOSIS — M5030 Other cervical disc degeneration, unspecified cervical region: Secondary | ICD-10-CM | POA: Diagnosis not present

## 2020-08-05 DIAGNOSIS — S0003XA Contusion of scalp, initial encounter: Secondary | ICD-10-CM | POA: Diagnosis not present

## 2020-08-05 DIAGNOSIS — S0990XA Unspecified injury of head, initial encounter: Secondary | ICD-10-CM | POA: Diagnosis present

## 2020-08-05 LAB — CBC
HCT: 38 % (ref 36.0–46.0)
Hemoglobin: 12.2 g/dL (ref 12.0–15.0)
MCH: 31.2 pg (ref 26.0–34.0)
MCHC: 32.1 g/dL (ref 30.0–36.0)
MCV: 97.2 fL (ref 80.0–100.0)
Platelets: 242 10*3/uL (ref 150–400)
RBC: 3.91 MIL/uL (ref 3.87–5.11)
RDW: 12.2 % (ref 11.5–15.5)
WBC: 6.4 10*3/uL (ref 4.0–10.5)
nRBC: 0 % (ref 0.0–0.2)

## 2020-08-05 LAB — RESP PANEL BY RT-PCR (FLU A&B, COVID) ARPGX2
Influenza A by PCR: NEGATIVE
Influenza B by PCR: NEGATIVE
SARS Coronavirus 2 by RT PCR: NEGATIVE

## 2020-08-05 LAB — TSH: TSH: 1.015 u[IU]/mL (ref 0.350–4.500)

## 2020-08-05 LAB — TROPONIN I (HIGH SENSITIVITY)
Troponin I (High Sensitivity): 7 ng/L (ref <18)
Troponin I (High Sensitivity): 8 ng/L (ref <18)

## 2020-08-05 LAB — HEPATIC FUNCTION PANEL
ALT: 16 U/L (ref 0–44)
AST: 21 U/L (ref 15–41)
Albumin: 3.8 g/dL (ref 3.5–5.0)
Alkaline Phosphatase: 130 U/L — ABNORMAL HIGH (ref 38–126)
Bilirubin, Direct: 0.1 mg/dL (ref 0.0–0.2)
Indirect Bilirubin: 0.6 mg/dL (ref 0.3–0.9)
Total Bilirubin: 0.7 mg/dL (ref 0.3–1.2)
Total Protein: 6.6 g/dL (ref 6.5–8.1)

## 2020-08-05 LAB — T4, FREE: Free T4: 1.14 ng/dL — ABNORMAL HIGH (ref 0.61–1.12)

## 2020-08-05 LAB — BASIC METABOLIC PANEL
Anion gap: 7 (ref 5–15)
BUN: 16 mg/dL (ref 8–23)
CO2: 26 mmol/L (ref 22–32)
Calcium: 8.9 mg/dL (ref 8.9–10.3)
Chloride: 104 mmol/L (ref 98–111)
Creatinine, Ser: 0.81 mg/dL (ref 0.44–1.00)
GFR, Estimated: >60 mL/min (ref >60)
Glucose, Bld: 118 mg/dL — ABNORMAL HIGH (ref 70–99)
Potassium: 4.6 mmol/L (ref 3.5–5.1)
Sodium: 137 mmol/L (ref 135–145)

## 2020-08-05 IMAGING — CT CT CERVICAL SPINE W/O CM
3 of 4 series · 13 of 33 positions shown, 16 images · non-contrast
Comparison: None.

CLINICAL DATA: Patient fell backwards and hit head on floor.

EXAM:
CT CERVICAL SPINE WITHOUT CONTRAST
TECHNIQUE: Multidetector CT imaging of the cervical spine was performed without
intravenous contrast. Multiplanar CT image reconstructions were also
generated.

[Series 6: orthogonal bone · axial · 0.29mm/px · z∈[-246,-124]mm · 5 of 101 slices shown, 7 images]
[im 17/101  soft-tissue]
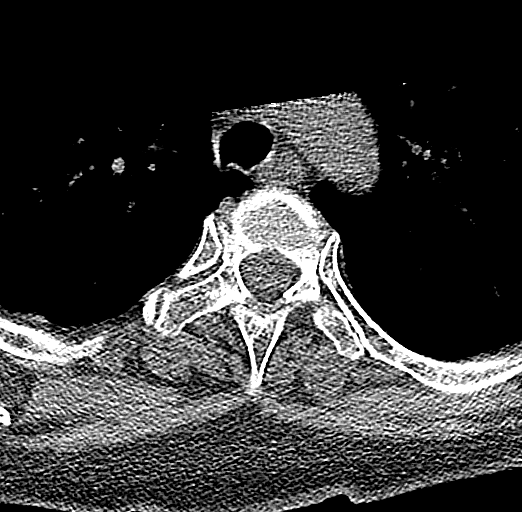
[im 17/101  bone]
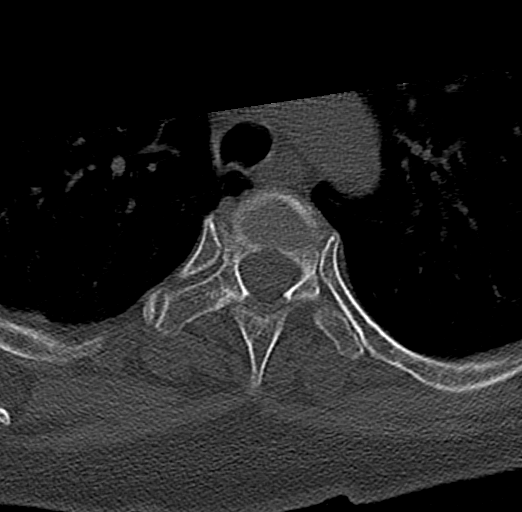
[im 34/101  bone]
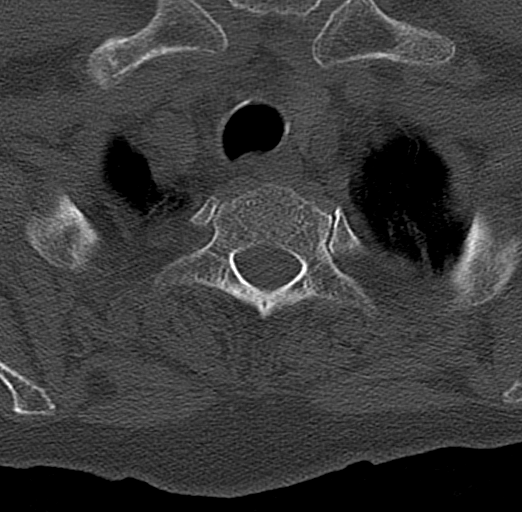
[im 51/101  bone]
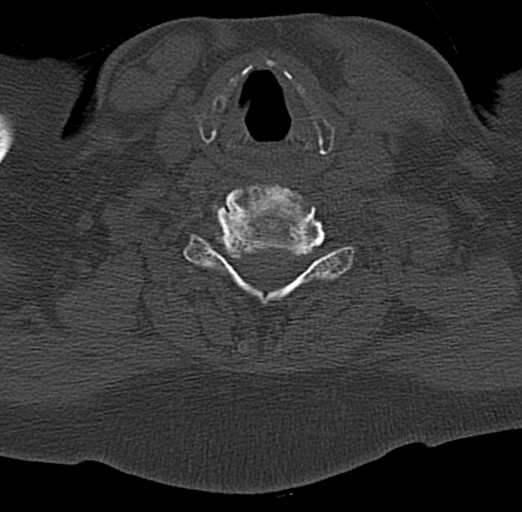
[im 67/101  bone]
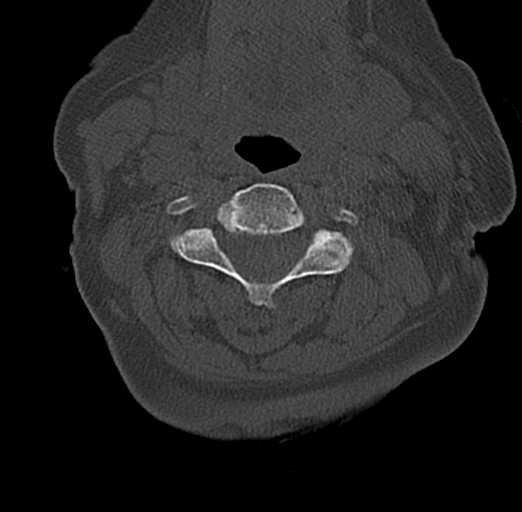
[im 84/101  soft-tissue]
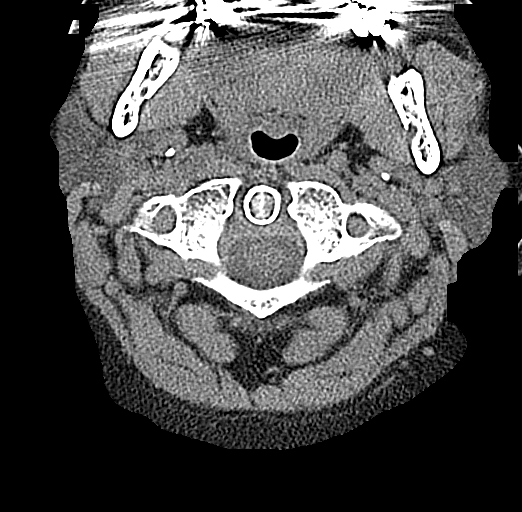
[im 84/101  bone]
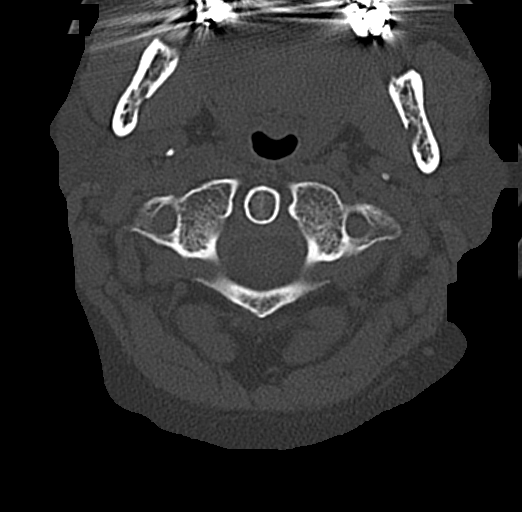

[Series 7: sagittal bone · sagittal · 0.31mm/px · 5 of 71 slices shown, 6 images]
[im 24/71  bone]
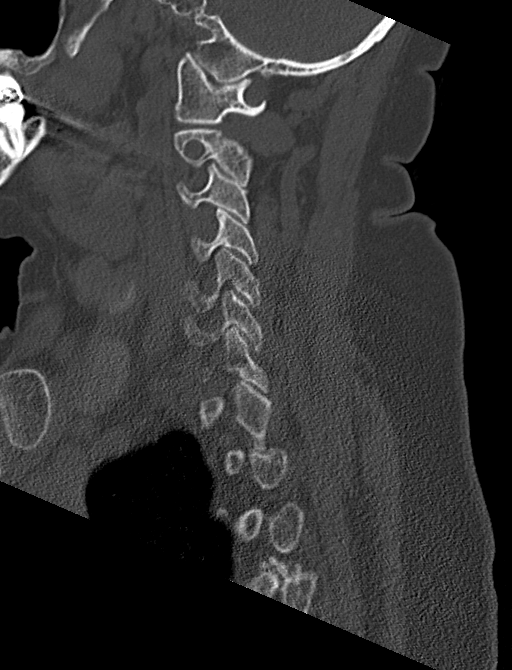
[im 30/71  bone]
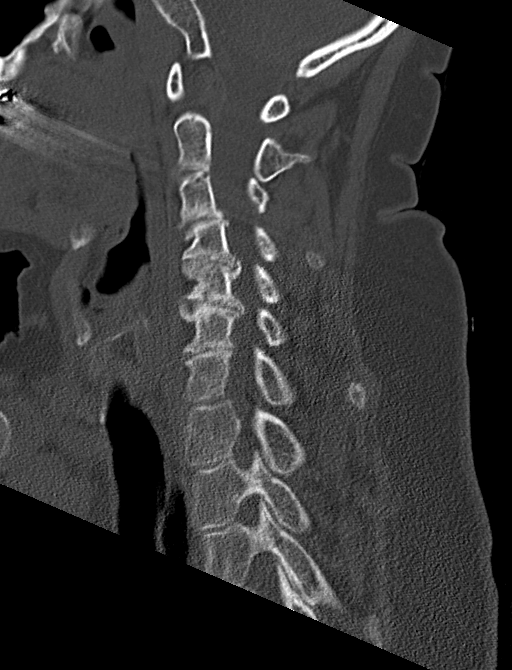
[im 36/71  soft-tissue]
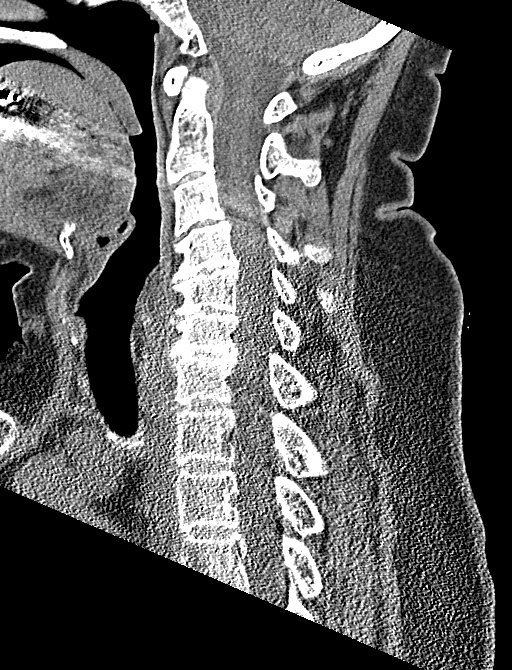
[im 36/71  bone]
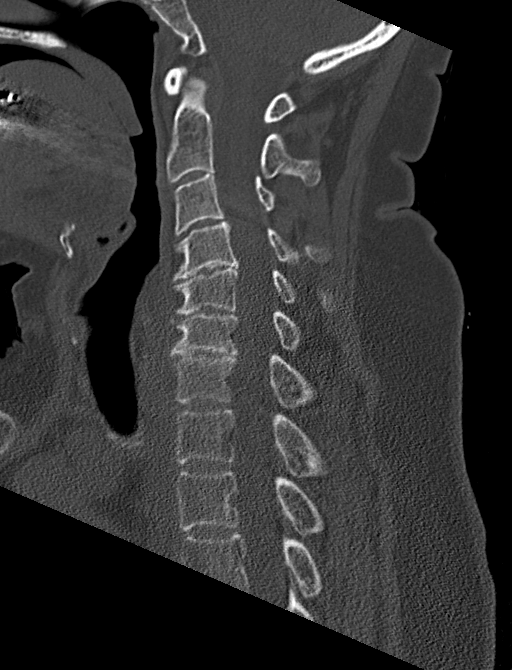
[im 41/71  bone]
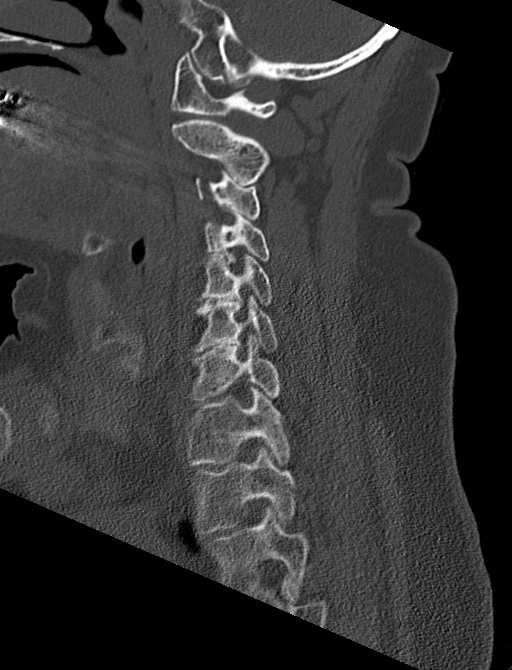
[im 47/71  bone]
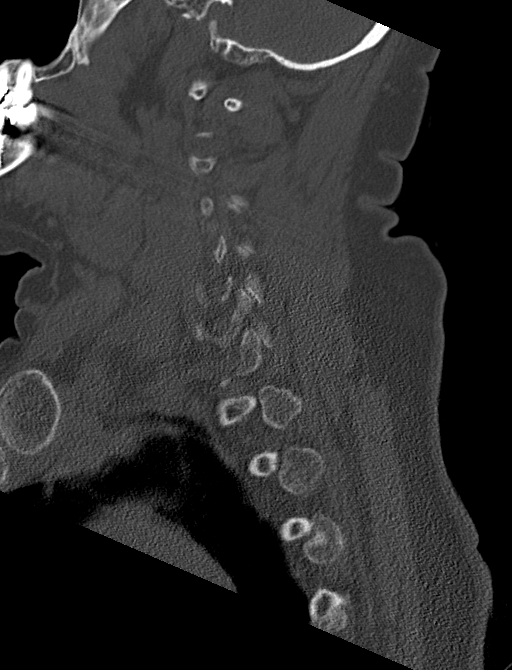

[Series 8: coronal bone · coronal · 0.28mm/px · 3 of 67 slices shown]
[im 16/67  bone]
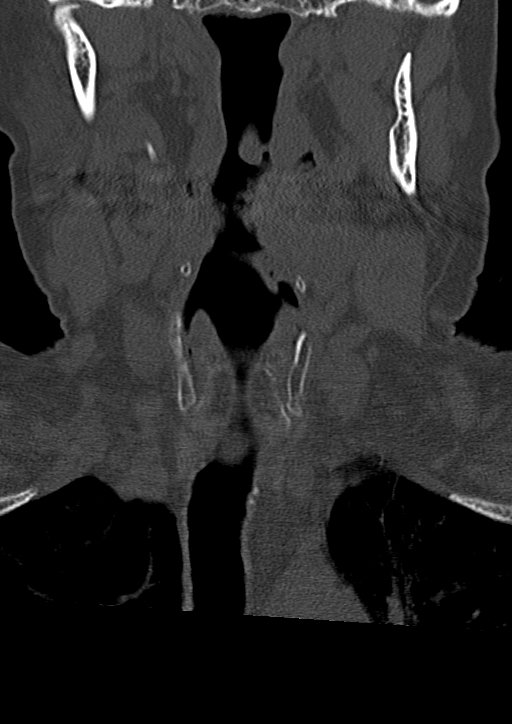
[im 28/67  bone]
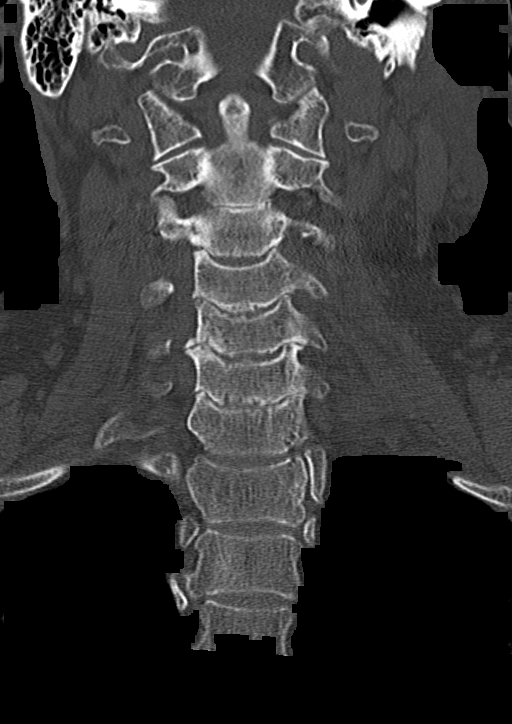
[im 40/67  bone]
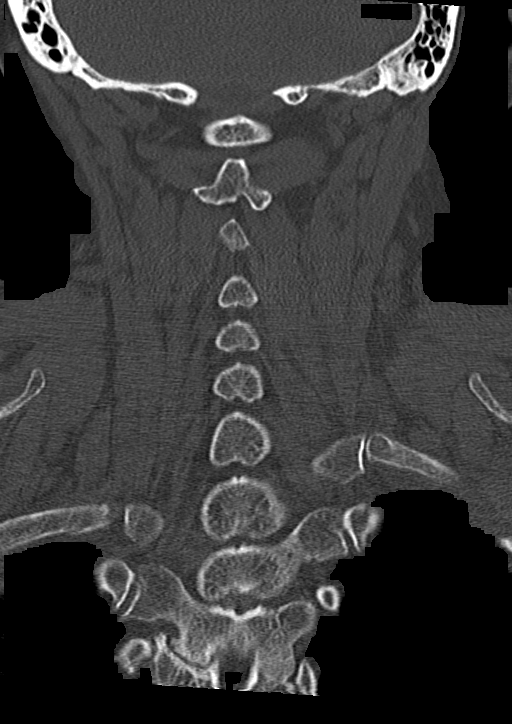

[13 of 33 positions shown; findings below may reference images not displayed]

FINDINGS: Alignment: Straightening of normal cervical lordosis evident without
subluxation.

Skull base and vertebrae: No acute fracture. No primary bone lesion
or focal pathologic process.

Soft tissues and spinal canal: No prevertebral fluid or swelling. No
visible canal hematoma.

Disc levels: Diffuse loss of disc height noted in the cervical
spine.

Upper chest: Unremarkable.

Other: None.
IMPRESSION: 1. No evidence for cervical spine fracture.
2. Loss of cervical lordosis. This can be related to patient
positioning, muscle spasm or soft tissue injury.
3. Diffuse degenerative disc disease.

## 2020-08-05 IMAGING — CT CT HEAD W/O CM
3 series · 16 of 47 positions shown, 19 images · non-contrast
Comparison: [DATE].

CLINICAL DATA: Syncope.

EXAM:
CT HEAD WITHOUT CONTRAST
TECHNIQUE: Contiguous axial images were obtained from the base of the skull
through the vertex without intravenous contrast.

[Series 2: head wo · axial · 0.43mm/px · z∈[-44,+86]mm · 10 of 32 slices shown, 13 images]
[im 3/32  brain]
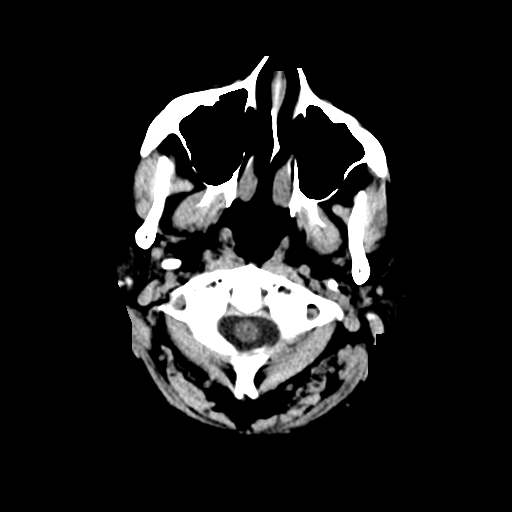
[im 3/32  bone]
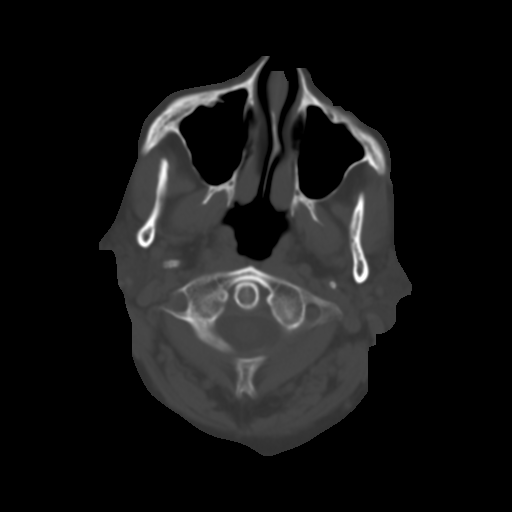
[im 6/32  brain]
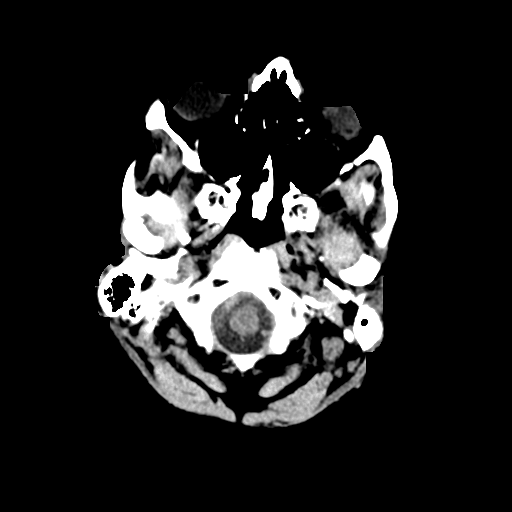
[im 9/32  brain]
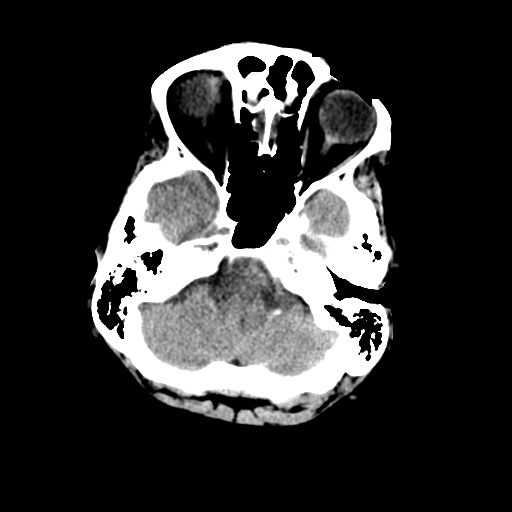
[im 11/32  brain]
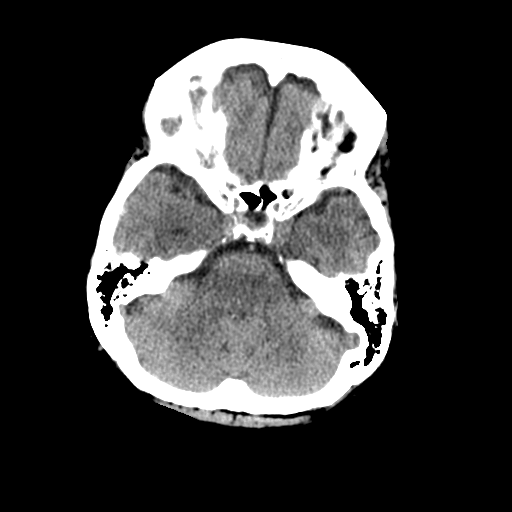
[im 14/32  brain]
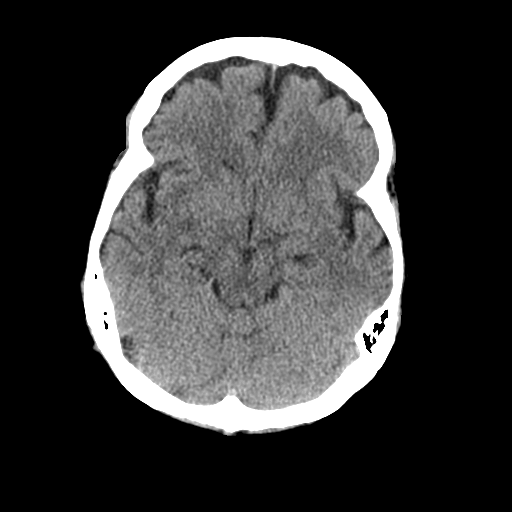
[im 14/32  bone]
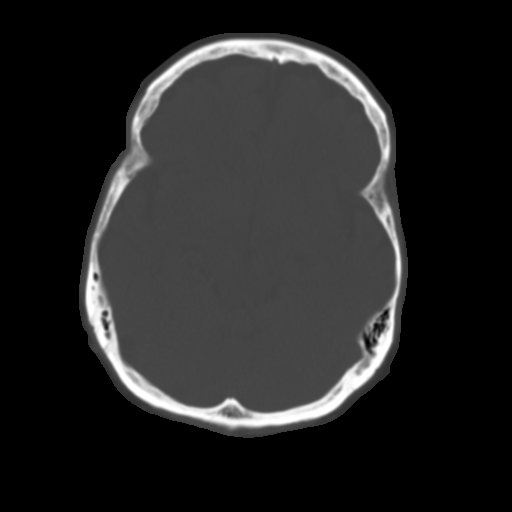
[im 18/32  brain]
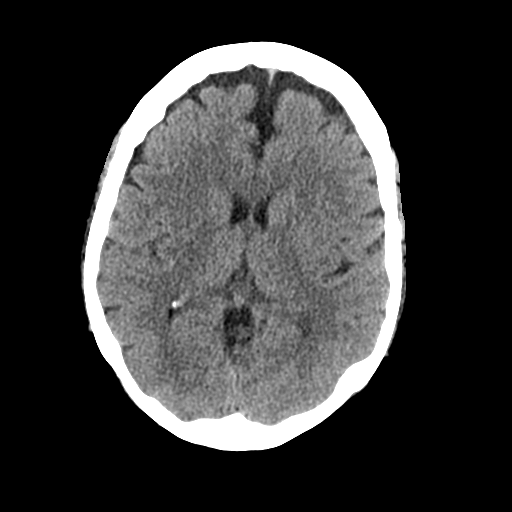
[im 21/32  brain]
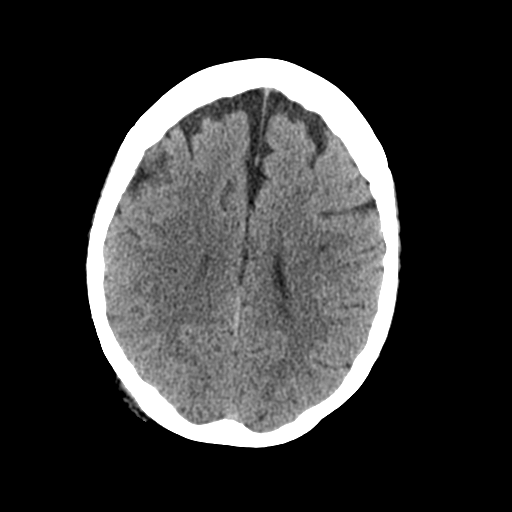
[im 24/32  brain]
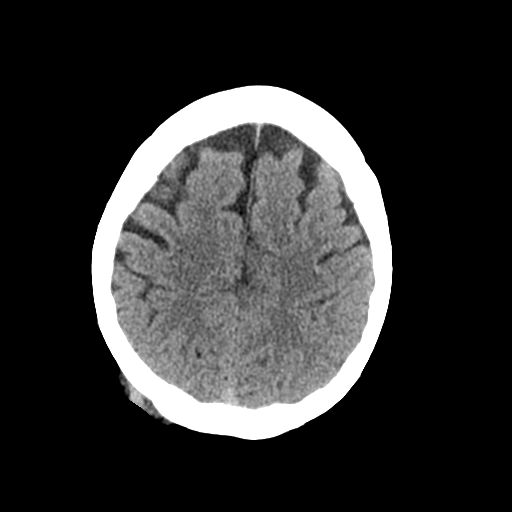
[im 26/32  brain]
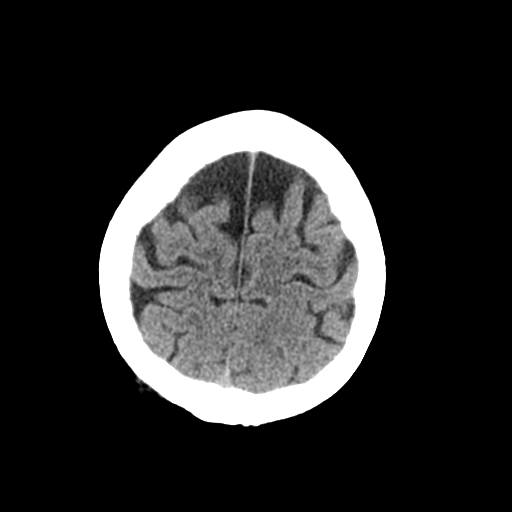
[im 26/32  bone]
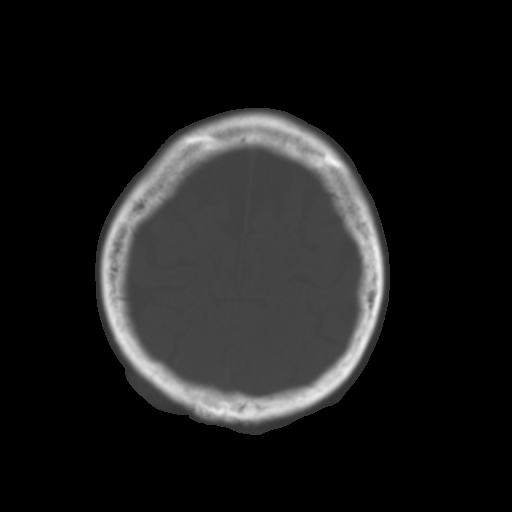
[im 29/32  brain]
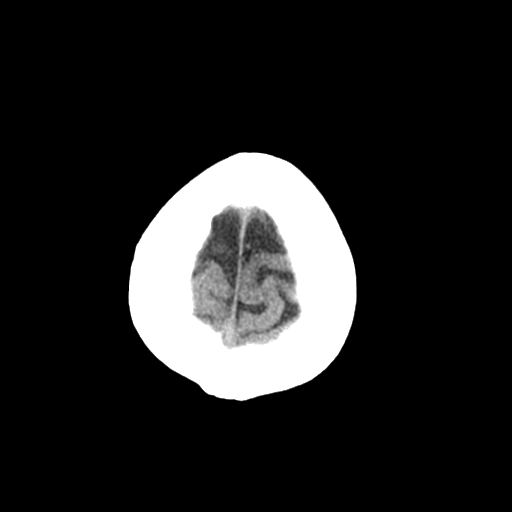

[Series 4: coronal soft tissue · coronal · 0.31mm/px · 3 of 63 slices shown]
[im 21/63  brain]
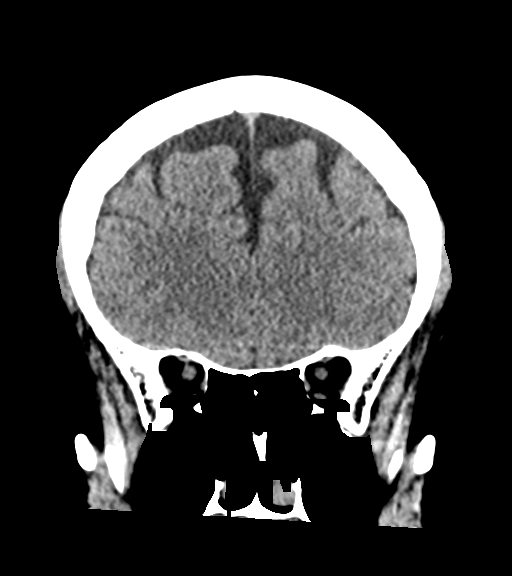
[im 28/63  brain]
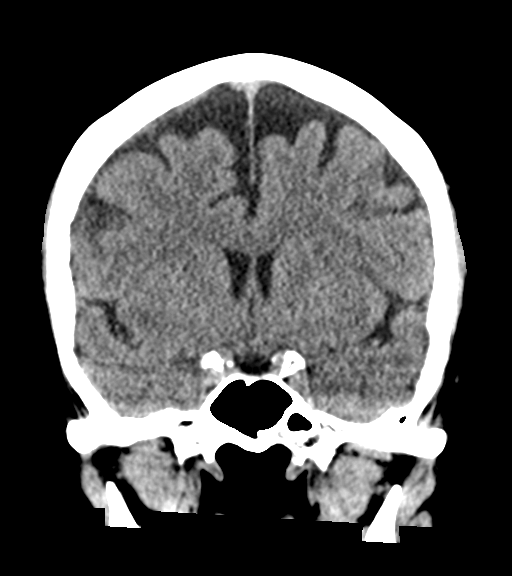
[im 35/63  brain]
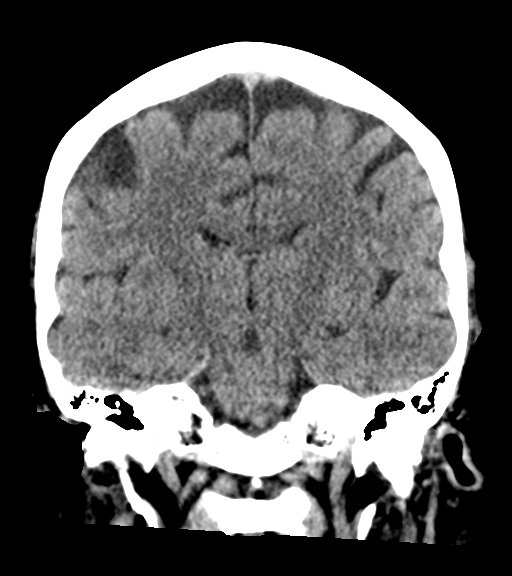

[Series 5: sagittal soft tissue · sagittal · 0.35mm/px · 3 of 54 slices shown]
[im 18/54  brain]
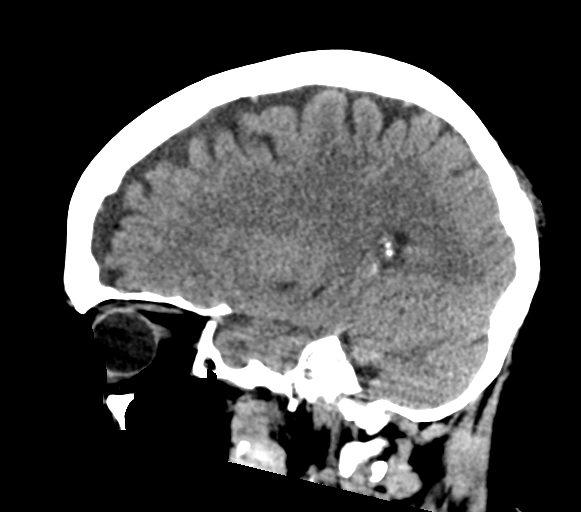
[im 27/54  brain]
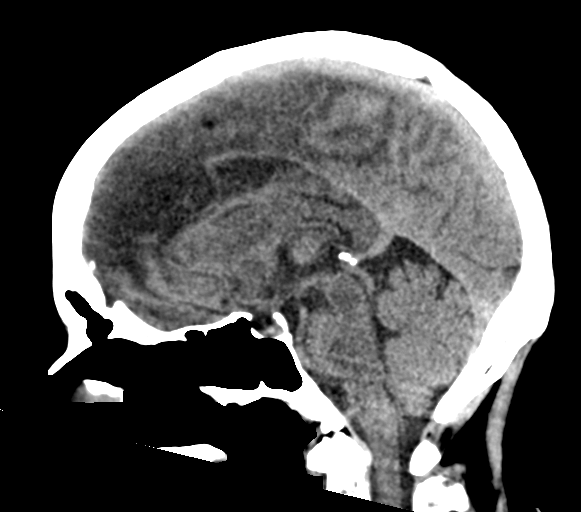
[im 36/54  brain]
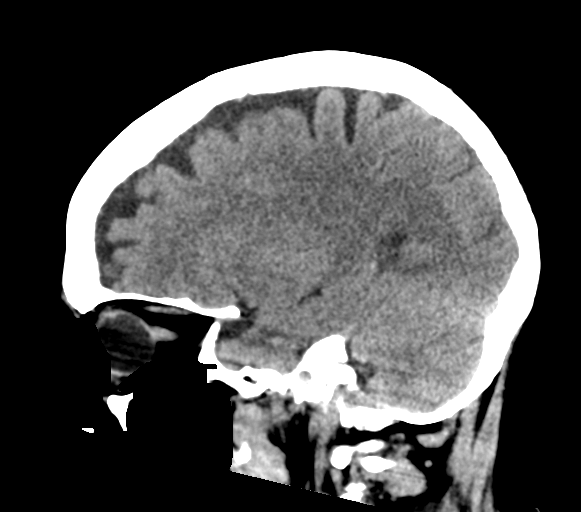

[16 of 47 positions shown; findings below may reference images not displayed]

FINDINGS: Brain: No evidence of acute infarction, hemorrhage, hydrocephalus,
extra-axial collection or mass lesion/mass effect.

Vascular: No hyperdense vessel or unexpected calcification.

Skull: Normal. Negative for fracture or focal lesion.

Sinuses/Orbits: No acute finding.

Other: Small right posterior scalp hematoma is noted.
IMPRESSION: Small right posterior scalp hematoma. No acute intracranial
abnormality seen.

## 2020-08-05 MED ORDER — ACETAMINOPHEN 500 MG PO TABS: 1000.0000 mg | ORAL_TABLET | Freq: Once | ORAL | Status: DC

## 2020-08-05 NOTE — ED Provider Notes (Signed)
[facility] Emergency Department Provider Note  ____________________________________________   Event Date/Time   First MD Initiated Contact with Patient 08/05/20 1135     (approximate)  I have reviewed the triage vital signs and the nursing notes.   HISTORY  Chief Complaint Loss of Consciousness    HPI Natalie [name] is a 70 y.o. female with CVA  on xarelto who comes in for concern for syncope.  Patient reports that she was making coffee and doing leg lifts for her physical therapy when she had sudden onset of dizziness and reports syncopal episode where she fell backwards.  Reports a hematoma to the back of her head and some neck pain.  She is unclear how long she was out for.  Denies any tongue biting or urinary incontinence.  Denies any chest pain, shortness of breath, abdominal pain, leg swelling.  It does have a implant monitor in.  On review of records, did have an echo on 04/23/2020 with some trivial regurgitation but nothing significant.  On review of records on 6/14 she had an episode of near syncope where she had some double vision prior to losing balance.        Past Medical History:  Diagnosis Date   Acute bronchospasm due to viral infection    Anemia    history of   CVA (cerebral vascular accident) ([place]) 02/28/2020   Depressive disorder    Epistaxis    Fatigue    Hearing loss    Hypothyroidism    Memory change    Migraine    Numbness and tingling    Obesity (BMI 30.0-34.9)    Osteoarthritis    Osteoporosis    Polyuria    Premature menopause    Seizures ([place])    history of mini seizures, possible migraine induced    Patient Active Problem List   Diagnosis Date Noted   Acute ITP ([place]) 05/02/2020   Other intra-abdominal and pelvic swelling, mass and lump 04/26/2020   Pelvic mass in female 04/25/2020   Ovarian cancer screening 04/25/2020   Ovarian mass, left 04/25/2020   Stroke (cerebrum) ([place]) 04/20/2020   Acute CVA  (cerebrovascular accident) ([place]) 04/19/2020   CVA (cerebral vascular accident) ([place]) 04/19/2020   History of ischemic multifocal posterior circulation stroke 02/29/2020   Thrombocytopenia ([place]) 02/28/2020   HPV test positive 07/08/2015   Difficulty hearing 06/28/2015   Arthritis, degenerative 06/28/2015   Obesity, Class I, BMI 30-34.9 06/28/2015   Paresthesia 06/28/2015   Purpura, 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/28/2015   Other specified hypothyroidism 08/09/2014   Migraine without aura and without status migrainosus, not intractable 09/19/2008   Moderate major depression ([place]) 09/07/2006   OP (osteoporosis) 09/07/2006    Past Surgical History:  Procedure Laterality Date   ABLATION  2006   BUBBLE STUDY  04/23/2020   Procedure: BUBBLE STUDY;  Surgeon: Larey Dresser, MD;  Location: [facility];  Service: Cardiovascular;;   CATARACT EXTRACTION W/ INTRAOCULAR LENS IMPLANT Bilateral    COLONOSCOPY     COLONOSCOPY WITH PROPOFOL N/A 12/22/2018   Procedure: COLONOSCOPY WITH PROPOFOL;  Surgeon: Virgel Manifold, MD;  Location: [facility] ENDOSCOPY;  Service: Endoscopy;  Laterality: N/A;   EYE SURGERY  Spring 2018   Cataracts both eyes   LOOP RECORDER INSERTION N/A 04/23/2020   Procedure: LOOP RECORDER INSERTION;  Surgeon: Evans Lance, MD;  Location: [facility];  Service: Cardiovascular;  Laterality: N/A;   TEE WITHOUT CARDIOVERSION N/A 04/23/2020   Procedure: TRANSESOPHAGEAL ECHOCARDIOGRAM (  TEE);  Surgeon: Larey Dresser, MD;  Location: [facility] ENDOSCOPY;  Service: Cardiovascular;  Laterality: N/A;   TONSILLECTOMY      Prior to Admission medications   Medication Sig Start Date End Date Taking? Authorizing Provider  atorvastatin (LIPITOR) 40 MG tablet Take 1 tablet (40 mg total) by mouth daily. 04/11/20   [name], MD  buPROPion (WELLBUTRIN SR) 150 MG 12 hr tablet Take 150 mg by mouth 2 (two) times daily.    Lew Dawes, MD  citalopram (CELEXA) 40 MG tablet Take 40 mg by mouth  daily.    Lew Dawes, MD  clindamycin-benzoyl peroxide [facility]) gel Apply topically 2 (two) times daily. Patient taking differently: Apply 1 application topically 2 (two) times daily as needed (facial acne). 05/10/19   [name], MD  clopidogrel (PLAVIX) 75 MG tablet Take 1 tablet (75 mg total) by mouth daily. 04/24/20 04/24/21  Caren Griffins, MD  Cyanocobalamin (VITAMIN B-12) 1000 MCG SUBL Place 1,000 mcg under the tongue at bedtime.    [provider]  levothyroxine (SYNTHROID) 75 MCG tablet Take 1 tablet (75 mcg total) by mouth daily before breakfast. 05/11/20   [name], MD  Multiple Vitamin (MULTIVITAMIN WITH MINERALS) TABS tablet Take 1 tablet by mouth daily.    [provider]  polyvinyl alcohol (LIQUIFILM TEARS) 1.4 % ophthalmic solution Place 1 drop into both eyes daily as needed for dry eyes.    [provider]  rivaroxaban (XARELTO) 20 MG TABS tablet Take 20 mg by mouth daily with supper.    [provider]  topiramate (TOPAMAX) 25 MG tablet Take 25 mg by mouth daily.     Lew Dawes, MD  Vitamin D, Ergocalciferol, (DRISDOL) 1.25 MG (50000 UNIT) CAPS capsule TAKE 1 CAPSULE BY MOUTH EVERY 7 DAYS Patient taking differently: Take 50,000 Units by mouth every Tuesday. 04/02/20   [name], MD    Allergies Sulfamethoxazole-trimethoprim  Family History  Problem Relation Age of Onset   Early death Father        suicide   Depression Father    Diabetes Father    Cancer Father        prostate   Hearing loss Father        due to war   Alzheimer's disease Mother    Heart disease Brother    Atrial fibrillation Brother    Heart disease Maternal Aunt    Dementia Maternal Aunt    Heart disease Maternal Uncle    Dementia Maternal Grandmother    Heart disease Brother    Atrial fibrillation Brother    Colon cancer Neg Hx    Breast cancer Neg Hx    Ovarian cancer Neg Hx    Pancreatic cancer Neg Hx    Endometrial cancer Neg  Hx     Social History Social History   Tobacco Use   Smoking status: Former    Packs/day: 0.50    Years: 6.00    Pack years: 3.00    Types: Cigarettes    Quit date: 02/11/1974    Years since quitting: 46.5   Smokeless tobacco: Never  Vaping Use   Vaping Use: Never used  Substance Use Topics   Alcohol use: Yes    Alcohol/week: 0.0 standard drinks    Comment: 2-3 drinks monthly   Drug use: Never      Review of Systems Constitutional: No fever/chills, + dizzy syncope  Eyes: No visual changes. ENT: No sore throat. Cardiovascular: Denies  chest pain. Respiratory: Denies shortness of breath. Gastrointestinal: No abdominal pain.  No nausea, no vomiting.  No diarrhea.  No constipation. Genitourinary: Negative for dysuria. Musculoskeletal: Negative for back pain. Skin: Negative for rash. Neurological: Negative for headaches, focal weakness or numbness. All other ROS negative ____________________________________________   PHYSICAL EXAM:  VITAL SIGNS: ED Triage Vitals  Enc Vitals Group     BP 08/05/20 0951 111/71     Pulse Rate 08/05/20 0951 66     Resp 08/05/20 0951 18     Temp 08/05/20 0951 98.5 F (36.9 C)     Temp Source 08/05/20 0951 Oral     SpO2 08/05/20 0951 98 %     Weight 08/05/20 0951 198 lb 6.6 oz (90 kg)     Height 08/05/20 0951 5' 6"  (1.676 m)     Head Circumference --      Peak Flow --      Pain Score 08/05/20 0951 2     Pain Loc --      Pain Edu? --      Excl. in [place]? --     Constitutional: Alert and oriented. Well appearing and in no acute distress. Eyes: Conjunctivae are normal. EOMI. Head: Small hematoma noted to the back of the head Nose: No congestion/rhinnorhea. Mouth/Throat: Mucous membranes are moist.   Neck: No stridor. Trachea Midline.  Positive mild C-spine tenderness Cardiovascular: Normal rate, regular rhythm. Grossly normal heart sounds.  Good peripheral circulation. Respiratory: Normal respiratory effort.  No retractions. Lungs  CTAB. Gastrointestinal: Soft and nontender. No distention. No abdominal bruits.  Musculoskeletal: No lower extremity tenderness nor edema.  No joint effusions. Neurologic:  Normal speech and language. No gross focal neurologic deficits are appreciated.  Cranial 2 through 12 are intact.  Equal strength in arms and legs.  Finger-to-nose intact.  Able to ambulate. Skin:  Skin is warm, dry and intact. No rash noted. Psychiatric: Mood and affect are normal. Speech and behavior are normal. GU: Deferred   ____________________________________________   LABS (all labs ordered are listed, but only abnormal results are displayed)  Labs Reviewed  BASIC METABOLIC PANEL - Abnormal; Notable for the following components:      Result Value   Glucose, Bld 118 (*)    All other components within normal limits  CBC  URINALYSIS, COMPLETE (UACMP) WITH MICROSCOPIC  TSH  T4, FREE  HEPATIC FUNCTION PANEL  TROPONIN I (HIGH SENSITIVITY)  TROPONIN I (HIGH SENSITIVITY)   ____________________________________________   ED ECG REPORT I, Vanessa [place], the attending physician, personally viewed and interpreted this ECG.  Normal sinus rate of 65, no ST elevation, no T wave inversions, type I AV block ____________________________________________  RADIOLOGY Robert Bellow, personally viewed and evaluated these images (plain radiographs) as part of my medical decision making, as well as reviewing the written report by the radiologist.  ED MD interpretation: No intracranial hemorrhage  Official radiology report(s): CT HEAD WO CONTRAST  Result Date: 08/05/2020 CLINICAL DATA:  Syncope. EXAM: CT HEAD WITHOUT CONTRAST TECHNIQUE: Contiguous axial images were obtained from the base of the skull through the vertex without intravenous contrast. COMPARISON:  July 17, 2020. FINDINGS: Brain: No evidence of acute infarction, hemorrhage, hydrocephalus, extra-axial collection or mass lesion/mass effect. Vascular: No hyperdense  vessel or unexpected calcification. Skull: Normal. Negative for fracture or focal lesion. Sinuses/Orbits: No acute finding. Other: Small right posterior scalp hematoma is noted. IMPRESSION: Small right posterior scalp hematoma. No acute intracranial abnormality seen. Electronically Signed  By: Marijo Conception M.D.   On: 08/05/2020 10:35    ____________________________________________   PROCEDURES  Procedure(s) performed (including Critical Care):  Procedures   ____________________________________________   INITIAL IMPRESSION / ASSESSMENT AND PLAN / ED COURSE  SAWYER KAHAN was evaluated in Emergency Department on 08/05/2020 for the symptoms described in the history of present illness. She was evaluated in the context of the global COVID-19 pandemic, which necessitated consideration that the patient might be at risk for infection with the SARS-CoV-2 virus that causes COVID-19. Institutional protocols and algorithms that pertain to the evaluation of patients at risk for COVID-19 are in a state of rapid change based on information released by regulatory bodies including the CDC and federal and state organizations. These policies and algorithms were followed during the patient's care in the ED.    Patient is a 70 year old who comes in with syncopal episode falling backwards.  CT head ordered for triage without any intracranial hemorrhage but CT cervical was not ordered patient does have a little bit of right upper C-spine tenderness will get CT to further evaluate.  Labs ordered to evaluate for Electra MIs, AKI, thyroid dysfunction, ACS.  She otherwise denies any symptoms at this time.  Does not sound like a seizure.  Patient does have a implant monitor and so will interrogate that to make sure no arrhythmia.  Patient also had recent echocardiogram that was normal which is reassuring.  No shortness of breath to suggest PE.  No abdominal pain to suggest AAA.  12:28 PM [employer] report did not  show any arrhythmias over the past 4 days.  Cardiac markers are negative x2.  Alk phos is slightly elevated but similar to previous.  No signs of dehydration, anemia, infection  I did review patient's prior MRIs and she has had some strokes noted in the MCA and infarcts as well as the occipital region.  Discussed the case with neurologist Dr. Cheral Marker who did not feel like it sounded like a stroke and just needed a syncopal work-up.  Discussed with patient that we could do an MRI just to make sure no evidence of stroke but at this time it seems very low given her she is completely neuro intact and asymptomatic.  She has declined MRI at this time.  Orthostatics were negative and patient's been ambulatory without any recurrent symptoms  We discussed slower position changes and not doing exercises while standing up.  We discussed admission but given they would mostly just monitor on the cardiac monitor which she already has a loop implant and do an echocardiogram which she is had done recently I am not sure what else they would be able to add to this work-up.  At this time she felt comfortable with discharge home and will follow up with her primary care doctor, cardiology and neurology  I discussed the provisional nature of ED diagnosis, the treatment so far, the ongoing plan of care, follow up appointments and return precautions with the patient and any family or support people present. They expressed understanding and agreed with the plan, discharged home.            __________________________________________  __   FINAL CLINICAL IMPRESSION(S) / ED DIAGNOSES   Final diagnoses:  Syncope and collapse      MEDICATIONS GIVEN DURING THIS VISIT:  Medications  acetaminophen (TYLENOL) tablet 1,000 mg (1,000 mg Oral Patient Refused/Not Given 08/05/20 1213)     ED Discharge Orders     None  Note:  This document was prepared using Dragon voice recognition software and may  include unintentional dictation errors.    Vanessa [place], MD 08/05/20 [phone]

## 2020-08-05 NOTE — ED Triage Notes (Signed)
Pt comes into the ED via POV c/o syncopal episode this morning where she fell backwards and hit her head on the hardwood floors.  PT has hematoma present to the back of her head.  PT does take blood thinners (xarelto and plavix).  Pt currently neurologically intact.  Pt admits to discomfort on the posterior side of her head and she admits she had a near syncopal episode last week.

## 2020-08-05 NOTE — ED Notes (Signed)
Signature pad not working- pt gives verbal consent for discharge.

## 2020-08-05 NOTE — ED Notes (Signed)
Pt states she was making coffee and doing exercises when she got dizzy and passed out. States that she did hit her head. AOx4 now and able to walk from wheelchair to bed.

## 2020-08-05 NOTE — Discharge Instructions (Addendum)
Follow-up with your primary care doctor.  Keep track of the symptoms.  Your work-up today was reassuring.  One of your thyroid test was slightly elevated and you can discuss this with your primary care doctor by do not think it is contributing to what happened today.  Return to the ER for chest pain, shortness of breath, recurrent syncope or any other concern

## 2020-08-07 ENCOUNTER — Ambulatory Visit (INDEPENDENT_AMBULATORY_CARE_PROVIDER_SITE_OTHER): Payer: Medicare Other

## 2020-08-07 ENCOUNTER — Encounter: Payer: Self-pay | Admitting: Family Medicine

## 2020-08-07 DIAGNOSIS — I639 Cerebral infarction, unspecified: Secondary | ICD-10-CM

## 2020-08-07 LAB — CUP PACEART REMOTE DEVICE CHECK
Date Time Interrogation Session: 20220703230906
Implantable Pulse Generator Implant Date: 20220321

## 2020-08-08 ENCOUNTER — Other Ambulatory Visit: Payer: Self-pay

## 2020-08-08 ENCOUNTER — Ambulatory Visit: Payer: Medicare Other | Attending: Family Medicine

## 2020-08-08 DIAGNOSIS — R2681 Unsteadiness on feet: Secondary | ICD-10-CM | POA: Diagnosis not present

## 2020-08-08 DIAGNOSIS — R278 Other lack of coordination: Secondary | ICD-10-CM | POA: Diagnosis not present

## 2020-08-08 DIAGNOSIS — R2689 Other abnormalities of gait and mobility: Secondary | ICD-10-CM | POA: Diagnosis not present

## 2020-08-08 DIAGNOSIS — I63412 Cerebral infarction due to embolism of left middle cerebral artery: Secondary | ICD-10-CM | POA: Insufficient documentation

## 2020-08-08 DIAGNOSIS — F330 Major depressive disorder, recurrent, mild: Secondary | ICD-10-CM | POA: Diagnosis not present

## 2020-08-08 DIAGNOSIS — M6281 Muscle weakness (generalized): Secondary | ICD-10-CM | POA: Diagnosis not present

## 2020-08-08 NOTE — Therapy (Signed)
[place] MAIN [facility] SERVICES [address] [place], [place], [address] Phone: [phone]   Fax:  [phone]  Physical Therapy Treatment Physical Therapy Progress Note   Dates of reporting period  05/21/20   to   08/08/20   Patient Details  Name: Natalie [name] MRN: [id number] Date of Birth: Apr 06, 1950 Referring Provider (PT): [name], MD   Encounter Date: 08/08/2020   PT End of Session - 08/08/20 1627     Visit Number 10    Number of Visits 25    Date for PT Re-Evaluation 08/13/20    Authorization Type Traditional Medicare with [employer] Supplemental: VL based on certification    Authorization Time Period 05/21/20-08/13/20    PT Start Time 1615    PT Stop Time 1700    PT Time Calculation (min) 45 min    Equipment Utilized During Treatment Gait belt    Activity Tolerance Patient tolerated treatment well;Patient limited by fatigue    Behavior During Therapy WFL for tasks assessed/performed             Past Medical History:  Diagnosis Date   Acute bronchospasm due to viral infection    Anemia    history of   CVA (cerebral vascular accident) ([place]) 02/28/2020   Depressive disorder    Epistaxis    Fatigue    Hearing loss    Hypothyroidism    Memory change    Migraine    Numbness and tingling    Obesity (BMI 30.0-34.9)    Osteoarthritis    Osteoporosis    Polyuria    Premature menopause    Seizures ([place])    history of mini seizures, possible migraine induced    Past Surgical History:  Procedure Laterality Date   ABLATION  2006   BUBBLE STUDY  04/23/2020   Procedure: BUBBLE STUDY;  Surgeon: Larey Dresser, MD;  Location: [place];  Service: Cardiovascular;;   CATARACT EXTRACTION W/ INTRAOCULAR LENS IMPLANT Bilateral    COLONOSCOPY     COLONOSCOPY WITH PROPOFOL N/A 12/22/2018   Procedure: COLONOSCOPY WITH PROPOFOL;  Surgeon: Virgel Manifold, MD;  Location: [facility] ENDOSCOPY;  Service: Endoscopy;  Laterality:  N/A;   EYE SURGERY  Spring 2018   Cataracts both eyes   LOOP RECORDER INSERTION N/A 04/23/2020   Procedure: LOOP RECORDER INSERTION;  Surgeon: Evans Lance, MD;  Location: [place] CV [facility];  Service: Cardiovascular;  Laterality: N/A;   TEE WITHOUT CARDIOVERSION N/A 04/23/2020   Procedure: TRANSESOPHAGEAL ECHOCARDIOGRAM (TEE);  Surgeon: Larey Dresser, MD;  Location: [facility] ENDOSCOPY;  Service: Cardiovascular;  Laterality: N/A;   TONSILLECTOMY      There were no vitals filed for this visit.   Subjective Assessment - 08/08/20 1624     Subjective Patient went to ER on 7/3 due to falling backwards and hit head. She had imaging done and has an app with her primary next week.    Pertinent History Pt confirms the following hx is accurate (initially from acute care PT eval): "the pt is a 70 yo female presenting 3/17 with confusion and difficulty word-finding. Imaging revealed L MCA infarcts with nearly occlusive thrombus in proximal M2 and M3." PMH includes the following: CVA in Jan 2022 (for further details see ACUTE PT eval in EMR from 02/29/2020), anxiety, hypothyroidism, depression, OA, osteoporosis, memory change, migraine, seizures, hearing loss.    Limitations Standing;Walking;Lifting;House hold activities;Writing   she says she limits how heavy she lifts due to hesitation  How long can you sit comfortably? not limited    How long can you stand comfortably? Pt reports she doesn't like to stand still for very long    How long can you walk comfortably? Difficulty with balance when walking over uneven ground, but states she is able to walk a mile. She says sometimes she gets tired walking and will sit down. She feels overall she's been fairly energetic.    Diagnostic tests per chart: "[place] 04/19/20: MR BRAIN IMPRESSION:  1. Patchy small volume acute ischemic nonhemorrhagic posterior left  MCA distribution infarcts, likely reflecting an embolic shower. No  associated hemorrhage or mass effect.  2.  No other acute intracranial abnormality.  3. Underlying mild chronic microvascular ischemic disease.; 3/17 CT ANGIO HEAD NECK:  IMPRESSION:  Nearly occlusive thrombus at the bifurcation of a proximal M2 MCA  branch approximately 6 mm from the MCA bifurcation. Subsequent  occlusion of a proximal left M3 MCA branch. No stenosis in the neck."    Patient Stated Goals Pt says she wants to improve her balance and does not want to fall again    Currently in Pain? Yes    Pain Score 2     Pain Location Head    Pain Descriptors / Indicators Headache                   Goals:  DGI; perform next session  ABC: 74%  Stand on airex pad for 5 seconds with eyes closed: MET FOTO: 59%  DHI: 22%   Treatment:    Neuro Re-ed: close CGA for all of the following Standing with CGA next to support surface:  Airex pad: horizontal head turns 30 seconds scanning room 10x ; cueing for arc of motion  Airex pad: vertical head turns 30 seconds, cueing for arc of motion, noticeable sway with upward gaze increasing demand on ankle righting reaction musculature Airex pad: narrow BOS 30 seconds Airex pad: dual task of naming animals alphabetically increased forward trunk flexion and sway  Airex balance beam: lateral stepping 4x length no UE support no LOB Airex balance beam: tandem walking no UE support able to perform with minimal LOB   TherEx: cues for body mechanics and sequencing.  STS 10x   Performed HEP:   Access Code: [email or web address] URL: [email or web address] Date: 08/08/2020 Prepared by: Janna Arch  Exercises Seated Long Arc Quad - 1 x daily - 7 x weekly - 2 sets - 10 reps - 5 hold Seated Hip Abduction with Resistance - 1 x daily - 7 x weekly - 2 sets - 10 reps - 5 hold Seated Hip Adduction Isometrics with Ball - 1 x daily - 7 x weekly - 2 sets - 10 reps - 5 hold Seated March - 1 x daily - 7 x weekly - 2 sets - 10 reps - 5 hold Seated Ankle Circles - 1 x daily - 7 x weekly - 2 sets  - 10 reps - 5 hold   Patient's condition has the potential to improve in response to therapy. Maximum improvement is yet to be obtained. The anticipated improvement is attainable and reasonable in a generally predictable time.  Patient reports ast week she was doing better but after her fall Sunday she has now significantly regressed.    Patient's goals performed, patient reports last week she was doing better but after her fall Sunday she has now significantly regressed. She did meet her standing on unstable surface goal however her self reported scores are  different as she is fearful from her fall. She is able to perform her exercises with PT stabilizing her and is given a new HEP in seated to prevent future falls at home. Patient's condition has the potential to improve in response to therapy. Maximum improvement is yet to be obtained. The anticipated improvement is attainable and reasonable in a generally predictable time. The pt will benefit from further skilled PT to improve strength, balance and gait in order to increase ease and safety with all functional mobility          PT Education - 08/08/20 1626     Education Details goals, progress note    Person(s) Educated Patient    Methods Explanation;Demonstration;Tactile cues;Verbal cues    Comprehension Verbalized understanding;Returned demonstration;Verbal cues required;Tactile cues required              PT Short Term Goals - 08/08/20 1704       PT SHORT TERM GOAL #1   Title Pt will be independent with HEP in order to improve strength and balance in order to decrease fall risk and improve function at home and work.    Baseline 4/18: to be initiated 7/6: HEP compliant, new HEP given    Time 6    Period Weeks    Status Partially Met    Target Date 07/02/20               PT Long Term Goals - 08/08/20 1705       PT LONG TERM GOAL #1   Title Pt will improve DGI by at least 3 points in order to demonstrate clinically  significant improvement in balance and decreased risk for falls.    Baseline 4/18: 18/24    Time 12    Period Weeks    Status New    Target Date 08/13/20      PT LONG TERM GOAL #2   Title Pt will improve ABC scale by at least 13% in order to demonstrate clinically significant improvement in balance confidence.    Baseline 4/18: 63.75% 7/6: 74%    Time 12    Period Weeks    Status On-going    Target Date 08/13/20      PT LONG TERM GOAL #3   Title Pt will demonstrate improved ability to maintain balance on compliant surface with EC for at least 5 seconds to indicate improved ability to navigate uneven surfaces and ambulate in dimly lit rooms.    Baseline 4/18: Pt unable to maintain balance with EC on foam on CTSIB-M 7/6: met    Time 12    Period Weeks    Status Achieved      PT LONG TERM GOAL #4   Title Patient will increase BLE gross strength to 4+/5 as to improve functional strength for independent gait, increased standing tolerance and increased ADL ability.    Baseline 4/18: gross BLE strength currnelty 4/5 7/6: hips 4+/5 ankles 4/5    Time 12    Period Weeks    Status On-going    Target Date 08/13/20      PT LONG TERM GOAL #5   Title Patient will increase FOTO score to equal to or greater than 80 to demonstrate improvement in mobility and quality of life.    Baseline 4/18: 74% 7/6: 59%    Time 12    Period Weeks    Status On-going    Target Date 08/13/20      PT LONG TERM GOAL #  6   Title Patient will reduce dizziness handicap inventory score to <10, for less dizziness with ADLs and increased safety with home and work tasks.    Baseline 4/27: DHI score is 16, indicating mild handicap 7/6: 22%    Time 11    Period Weeks    Status On-going    Target Date 08/13/20                   Plan - 08/08/20 1708     Clinical Impression Statement Patient's goals performed, patient reports last week she was doing better but after her fall Sunday she has now significantly  regressed. She did meet her standing on unstable surface goal however her self reported scores are different as she is fearful from her fall. She is able to perform her exercises with PT stabilizing her and is given a new HEP in seated to prevent future falls at home. Patient's condition has the potential to improve in response to therapy. Maximum improvement is yet to be obtained. The anticipated improvement is attainable and reasonable in a generally predictable time. The pt will benefit from further skilled PT to improve strength, balance and gait in order to increase ease and safety with all functional mobility    Personal Factors and Comorbidities Age;Comorbidity 1;Comorbidity 2;Past/Current Experience;Sex;Social Background;Behavior Pattern;Comorbidity 3+    Comorbidities CVA in Jan 2022, anxiety, hypothyroidism, depression, OA, osteoporosis, memory change, migraine, seizures, hearing loss.    Examination-Activity Limitations Bathing;Stairs;Stand;Hygiene/Grooming;Locomotion Level;Carry;Bed Mobility;Other;Reach Overhead;Lift    Examination-Participation Restrictions Community Activity;Yard Work;Shop;Cleaning;Laundry;Medication Management;Meal Prep    Stability/Clinical Decision Making Evolving/Moderate complexity    Rehab Potential Good    PT Frequency 2x / week    PT Duration 12 weeks    PT Treatment/Interventions ADLs/Self Care Home Management;Biofeedback;Canalith Repostioning;Cryotherapy;Electrical Stimulation;Aquatic Therapy;Moist Heat;Ultrasound;DME Instruction;Gait training;Stair training;Functional mobility training;Therapeutic activities;Therapeutic exercise;Balance training;Neuromuscular re-education;Cognitive remediation;Patient/family education;Orthotic Fit/Training;Manual techniques;Passive range of motion;Energy conservation;Taping;Splinting;Vestibular;Visual/perceptual remediation/compensation;Joint Manipulations    PT Next Visit Plan DGI    PT Home Exercise Plan Access Code: [email or web address],  added:  Access Code: [email or web address]    Consulted and Agree with Plan of Care Patient             Patient will benefit from skilled therapeutic intervention in order to improve the following deficits and impairments:  Abnormal gait, Decreased balance, Decreased endurance, Difficulty walking, Decreased range of motion, Dizziness, Improper body mechanics, Impaired vision/preception, Decreased coordination, Decreased strength, Decreased mobility, Obesity, Decreased activity tolerance, Decreased knowledge of use of DME, Impaired flexibility, Postural dysfunction  Visit Diagnosis: Unsteadiness on feet  Other abnormalities of gait and mobility  Muscle weakness (generalized)     Problem List Patient Active Problem List   Diagnosis Date Noted   Acute ITP ([place]) 05/02/2020   Other intra-abdominal and pelvic swelling, mass and lump 04/26/2020   Pelvic mass in female 04/25/2020   Ovarian cancer screening 04/25/2020   Ovarian mass, left 04/25/2020   Stroke (cerebrum) ([place]) 04/20/2020   Acute CVA (cerebrovascular accident) ([place]) 04/19/2020   CVA (cerebral vascular accident) ([place]) 04/19/2020   History of ischemic multifocal posterior circulation stroke 02/29/2020   Thrombocytopenia ([place]) 02/28/2020   HPV test positive 07/08/2015   Difficulty hearing 06/28/2015   Arthritis, degenerative 06/28/2015   Obesity, Class I, BMI 30-34.9 06/28/2015   Paresthesia 06/28/2015   Purpura, nonthrombopenic ([place]) 06/28/2015   Other specified hypothyroidism 08/09/2014   Migraine without aura and without status migrainosus, not intractable 09/19/2008   Moderate major depression ([place]) 09/07/2006   OP (osteoporosis) 09/07/2006  Janna Arch, PT, DPT  08/08/2020, 5:09 PM  [place] MAIN [facility] SERVICES [address] [place], [place], [address] Phone: [phone]   Fax:  [phone]  Name: Natalie [name] MRN: [id number] Date of Birth: September 27, 1950

## 2020-08-10 ENCOUNTER — Ambulatory Visit (HOSPITAL_COMMUNITY)
Admission: RE | Admit: 2020-08-10 | Discharge: 2020-08-10 | Disposition: A | Discharge status: Home / Self Care | Payer: Medicare Other | Source: Ambulatory Visit | Attending: Gynecologic Oncology | Admitting: Gynecologic Oncology

## 2020-08-10 ENCOUNTER — Other Ambulatory Visit: Payer: Self-pay

## 2020-08-10 DIAGNOSIS — N9489 Other specified conditions associated with female genital organs and menstrual cycle: Secondary | ICD-10-CM | POA: Diagnosis not present

## 2020-08-10 DIAGNOSIS — R1900 Intra-abdominal and pelvic swelling, mass and lump, unspecified site: Secondary | ICD-10-CM | POA: Diagnosis not present

## 2020-08-10 DIAGNOSIS — N858 Other specified noninflammatory disorders of uterus: Secondary | ICD-10-CM | POA: Diagnosis not present

## 2020-08-10 DIAGNOSIS — N83202 Unspecified ovarian cyst, left side: Secondary | ICD-10-CM | POA: Diagnosis not present

## 2020-08-10 DIAGNOSIS — D259 Leiomyoma of uterus, unspecified: Secondary | ICD-10-CM | POA: Diagnosis not present

## 2020-08-10 IMAGING — US US PELVIS COMPLETE WITH TRANSVAGINAL
1 series · 13 of 25 positions shown · non-contrast
Comparison: [DATE]

CLINICAL DATA: Pelvic mass, LEFT ovarian cyst, fibroids,
postmenopausal



[Series 1: us pelvis complete with transvaginal · 13 of 80 slices shown]
[im 1/80]
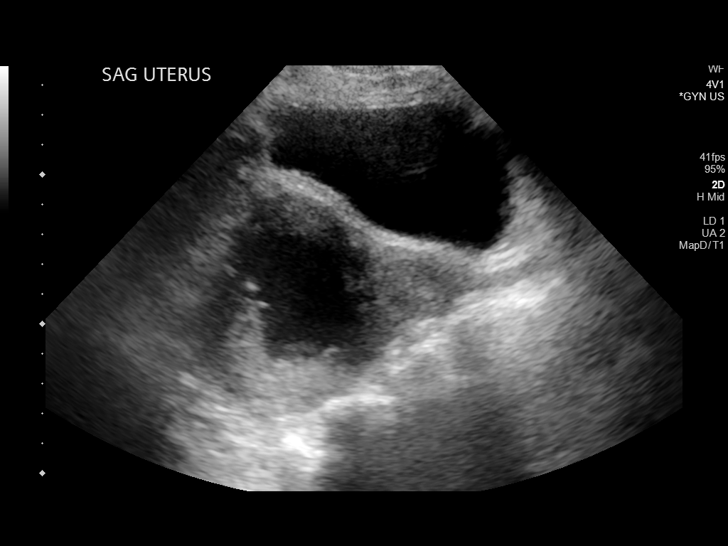
[im 7/80]
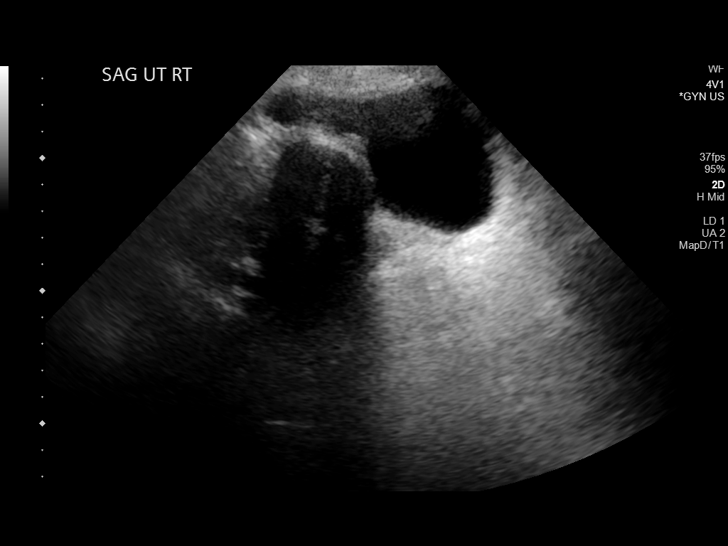
[im 14/80]
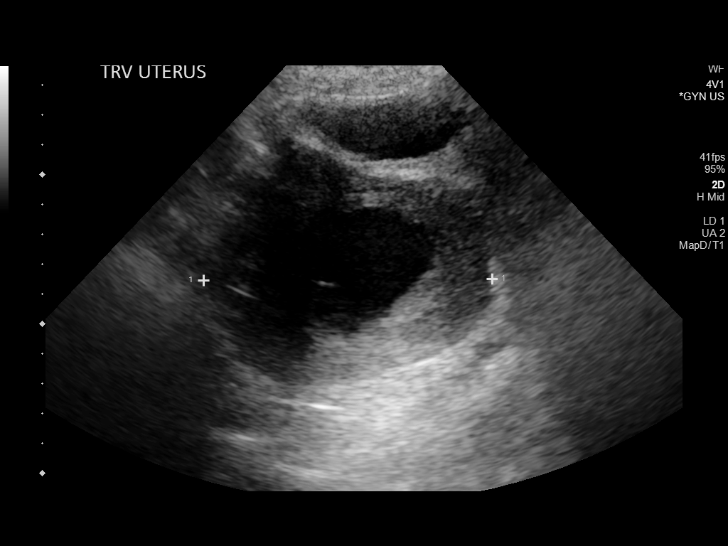
[im 20/80]
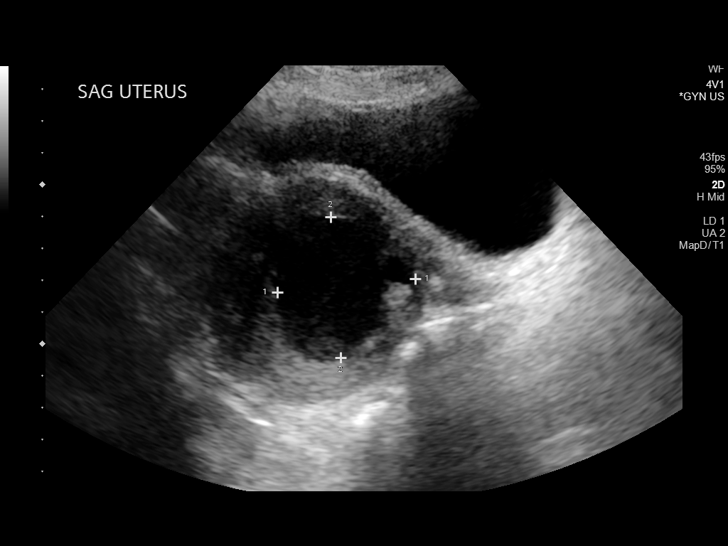
[im 27/80]
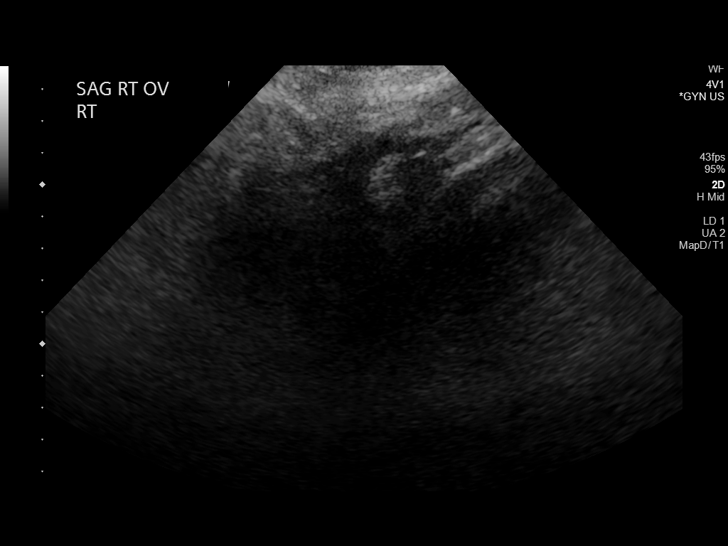
[im 33/80]
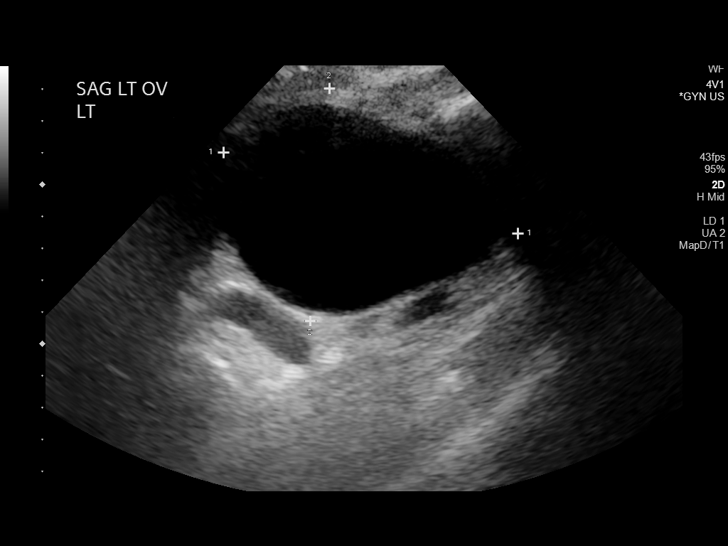
[im 40/80]
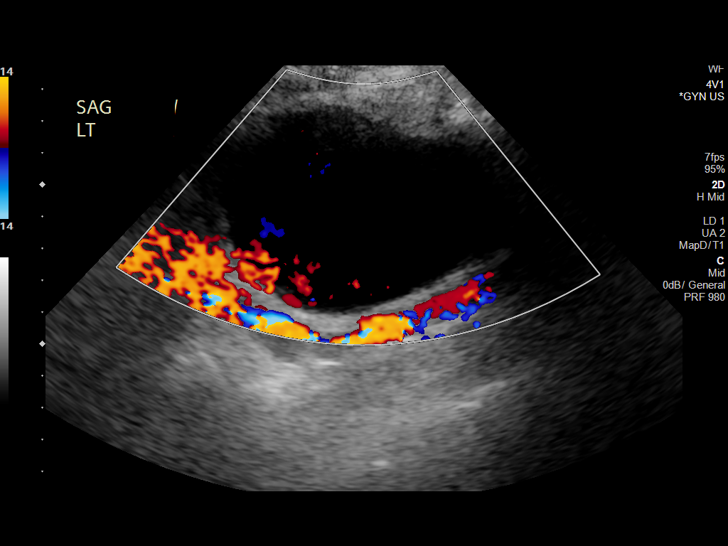
[im 47/80]
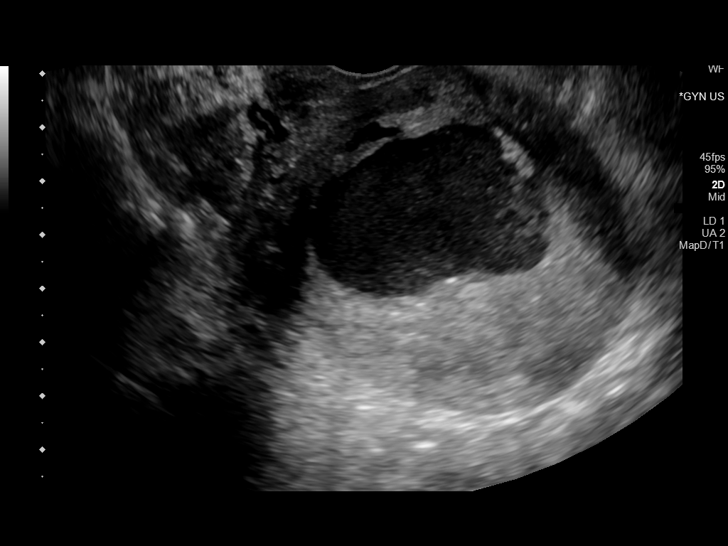
[im 53/80]
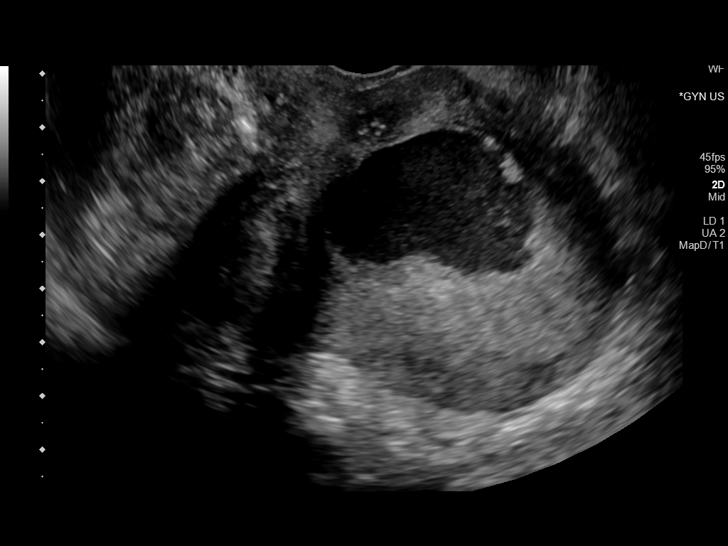
[im 60/80]
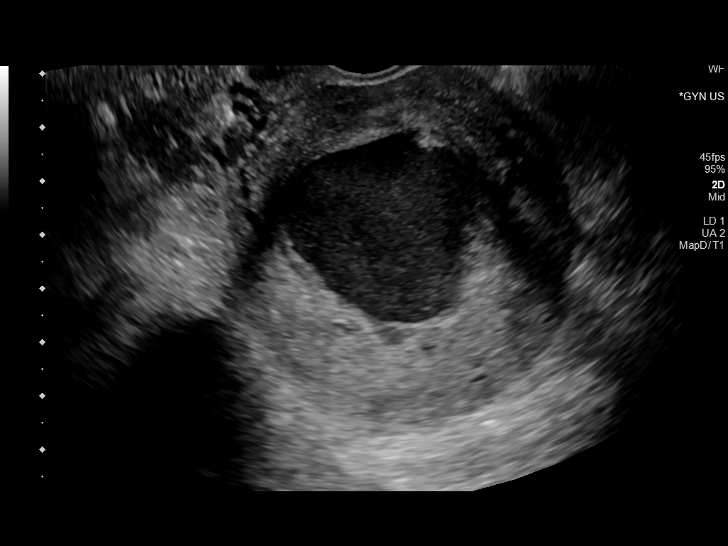
[im 66/80]
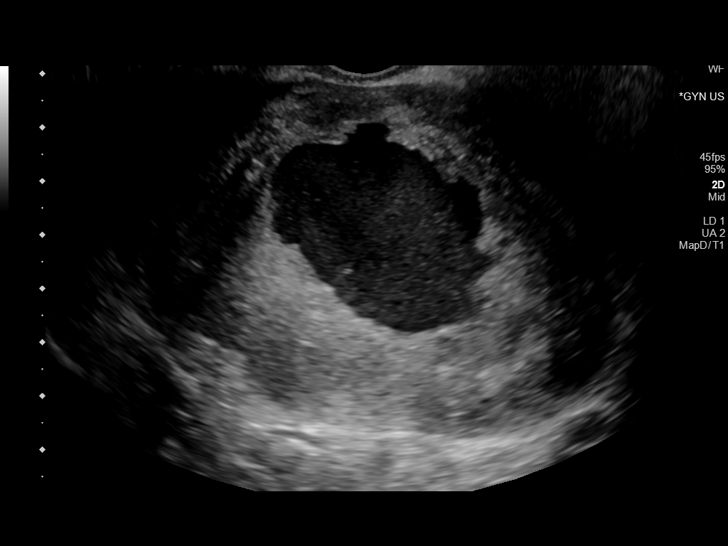
[im 73/80]
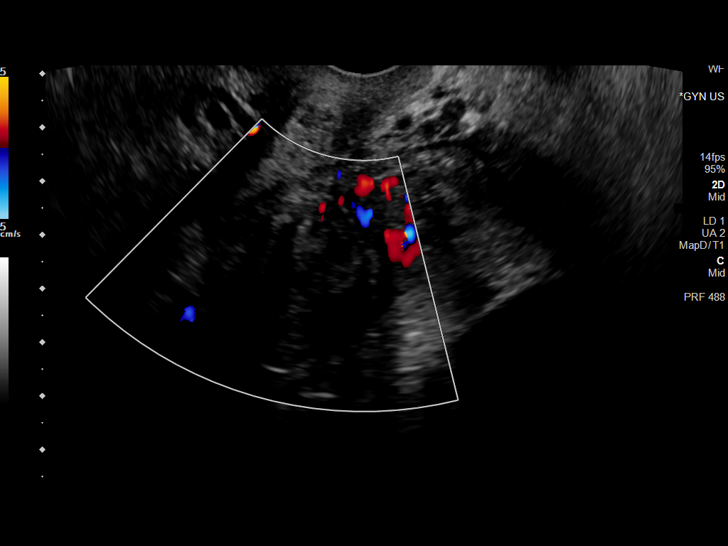
[im 80/80]
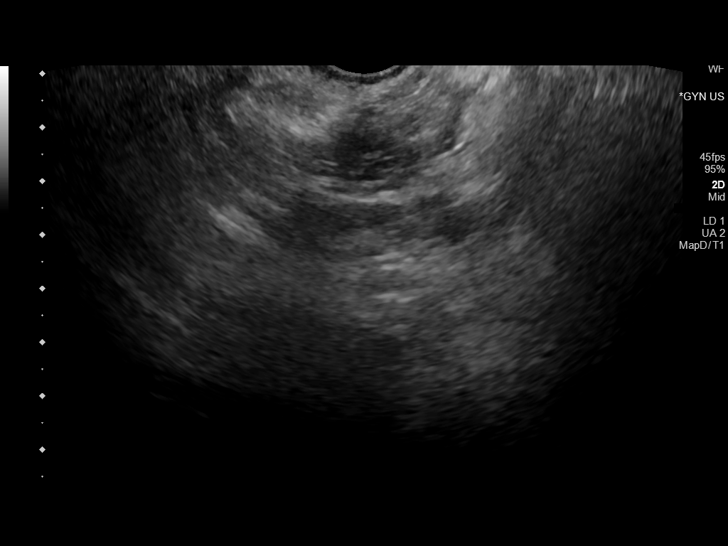

[13 of 25 positions shown; findings below may reference images not displayed]

FINDINGS: Uterus

Measurements: 8.5 x 5.8 x 8.1 cm = volume: 211 mL.  Retroverted.

Endometrium

Thickness: 14 mm. Irregular, nodular. Complex fluid collection
distending endometrial canal measuring 3.7 x 3.7 x 3.8 cm, question
hematometra.

Right ovary

Not visualized, likely obscured by bowel

Left ovary

Measurements: 9.6 x 7 x 3 x 10.4 cm = volume: 379 mL. Grossly simple
appearing cyst of the LEFT ovary 8.2 x 5.6 x 7.9 cm. No definite
mural nodularity or septations.

Other findings

No free pelvic fluid.  No additional masses.
IMPRESSION: Thickened heterogeneous endometrial complex with somewhat nodular
margins, associated with a 3.8 cm diameter fluid collection within
the endometrial canal likely blood products, cannot exclude cervical
stenosis or endometrial malignancy; tissue diagnosis recommended.

8.2 cm diameter cyst of the LEFT ovary; consider GYN consult and
followup US in 3-6 months, or pelvis MRI w/o and w/ contrast for
improved characterization. Note: This recommendation does not apply
to premenarchal patients or to those with increased risk (genetic,
family history, elevated tumor markers or other high-risk factors)
of ovarian cancer. Reference: Radiology [DATE]):[id number].

These results will be called to the ordering clinician or
representative by the Radiologist Assistant, and communication
documented in the PACS or [REDACTED].

## 2020-08-11 DIAGNOSIS — Z20822 Contact with and (suspected) exposure to covid-19: Secondary | ICD-10-CM | POA: Diagnosis not present

## 2020-08-13 ENCOUNTER — Inpatient Hospital Stay: Payer: Medicare Other | Attending: Hematology and Oncology | Admitting: Gynecologic Oncology

## 2020-08-13 ENCOUNTER — Encounter: Payer: Self-pay | Admitting: Gynecologic Oncology

## 2020-08-13 ENCOUNTER — Telehealth: Payer: Self-pay | Admitting: Gynecologic Oncology

## 2020-08-13 ENCOUNTER — Telehealth: Payer: Self-pay

## 2020-08-13 ENCOUNTER — Telehealth: Payer: Self-pay | Admitting: *Deleted

## 2020-08-13 DIAGNOSIS — N838 Other noninflammatory disorders of ovary, fallopian tube and broad ligament: Secondary | ICD-10-CM | POA: Diagnosis not present

## 2020-08-13 DIAGNOSIS — E669 Obesity, unspecified: Secondary | ICD-10-CM | POA: Insufficient documentation

## 2020-08-13 DIAGNOSIS — E039 Hypothyroidism, unspecified: Secondary | ICD-10-CM | POA: Insufficient documentation

## 2020-08-13 DIAGNOSIS — R971 Elevated cancer antigen 125 [CA 125]: Secondary | ICD-10-CM | POA: Insufficient documentation

## 2020-08-13 DIAGNOSIS — Z87891 Personal history of nicotine dependence: Secondary | ICD-10-CM | POA: Insufficient documentation

## 2020-08-13 DIAGNOSIS — M810 Age-related osteoporosis without current pathological fracture: Secondary | ICD-10-CM | POA: Insufficient documentation

## 2020-08-13 DIAGNOSIS — Z8673 Personal history of transient ischemic attack (TIA), and cerebral infarction without residual deficits: Secondary | ICD-10-CM | POA: Insufficient documentation

## 2020-08-13 DIAGNOSIS — R9389 Abnormal findings on diagnostic imaging of other specified body structures: Secondary | ICD-10-CM | POA: Insufficient documentation

## 2020-08-13 DIAGNOSIS — D398 Neoplasm of uncertain behavior of other specified female genital organs: Secondary | ICD-10-CM | POA: Insufficient documentation

## 2020-08-13 DIAGNOSIS — Z7901 Long term (current) use of anticoagulants: Secondary | ICD-10-CM | POA: Insufficient documentation

## 2020-08-13 DIAGNOSIS — Z79899 Other long term (current) drug therapy: Secondary | ICD-10-CM | POA: Insufficient documentation

## 2020-08-13 DIAGNOSIS — Z6830 Body mass index (BMI) 30.0-30.9, adult: Secondary | ICD-10-CM | POA: Insufficient documentation

## 2020-08-13 DIAGNOSIS — F32A Depression, unspecified: Secondary | ICD-10-CM | POA: Insufficient documentation

## 2020-08-13 NOTE — Progress Notes (Signed)
Gynecologic Oncology Telehealth Consult Note: Gyn-Onc  I connected with Natalie [name] on 08/13/20 at 12:15 PM EDT by telephone and verified that I am speaking with the correct person using two identifiers.  I discussed the limitations, risks, security and privacy concerns of performing an evaluation and management service by telemedicine and the availability of in-person appointments. I also discussed with the patient that there may be a patient responsible charge related to this service. The patient expressed understanding and agreed to proceed.  Other persons participating in the visit and their role in the encounter: none.  Patient's location: Home Provider's location: [facility]  Reason for Visit: Follow-up recent ultrasound  Treatment History: [name] recent history is significant for a stroke in 02/2020 after presenting with a month of intermittent and progressive diplopia.  MRI at that time showed bilateral posterior ischemic changes and the patient was treated with Plavix and aspirin for an acute ischemic stroke.  She had recurrence of symptoms in mid March and then presented with difficulty speaking and mentating.  She was diagnosed with an acute left MCA infarct and nearly occlusive thrombus.  She was continued on aspirin and Plavix.  Work-up during her most recent hospitalization included negative Dopplers of her lower extremities and a echo showing moderate left atrial enlargement.  She had a loop recorder placed as well.  During that hospitalization, she was noted to have thrombocytopenia which is presumed to be secondary to ITP and the patient was started on steroids.   During more recent hospitalization for stroke, patient underwent CT of the abdomen and pelvis showing an adnexal mass.  Pelvic ultrasound was recommended for follow-up.  Patient had a CA-125 that was elevated at almost 600.    CA-125 05/02/20: 592  05/11/20: pap negative  Interval  History: Today's phone visit is to discuss recent follow-up ultrasound.  Since our in person visit, the patient has been seen in the ED (on 6/14 for near syncope with double vision, MRI negative at that time; 7/3 for loss of consciousness) multiple times.  She thinks 1 of these episodes was related to moving and significant physical exertion.  New since her prior visit, for the last 2 weeks, she describes having an "ache" in her abdomen.  Sometimes she will feel this deep down in her pelvis near her vaginal area, sometimes she feels it on the left side and sometimes on the right, both in the pelvis and in the mid abdomen.  She has had 2 episodes that she describes as sharp, otherwise she characterizes the pain as an ache.  This was constant initially, it has now become intermittent.  It does not require the use of any pain medications.  She denies any vaginal bleeding or spotting.  She endorses a good appetite without nausea or emesis.  Past Medical/Surgical History: Past Medical History:  Diagnosis Date   Acute bronchospasm due to viral infection    Anemia    history of   CVA (cerebral vascular accident) ([place]) 02/28/2020   Depressive disorder    Epistaxis    Fatigue    Hearing loss    Hypothyroidism    Memory change    Migraine    Numbness and tingling    Obesity (BMI 30.0-34.9)    Osteoarthritis    Osteoporosis    Polyuria    Premature menopause    Seizures ([place])    history of mini seizures, possible migraine induced    Past Surgical History:  Procedure  Laterality Date   ABLATION  2006   BUBBLE STUDY  04/23/2020   Procedure: BUBBLE STUDY;  Surgeon: Larey Dresser, MD;  Location: [facility] ENDOSCOPY;  Service: Cardiovascular;;   CATARACT EXTRACTION W/ INTRAOCULAR LENS IMPLANT Bilateral    COLONOSCOPY     COLONOSCOPY WITH PROPOFOL N/A 12/22/2018   Procedure: COLONOSCOPY WITH PROPOFOL;  Surgeon: Virgel Manifold, MD;  Location: [facility] ENDOSCOPY;  Service: Endoscopy;  Laterality: N/A;    EYE SURGERY  Spring 2018   Cataracts both eyes   LOOP RECORDER INSERTION N/A 04/23/2020   Procedure: LOOP RECORDER INSERTION;  Surgeon: Evans Lance, MD;  Location: [place] CV LAB;  Service: Cardiovascular;  Laterality: N/A;   TEE WITHOUT CARDIOVERSION N/A 04/23/2020   Procedure: TRANSESOPHAGEAL ECHOCARDIOGRAM (TEE);  Surgeon: Larey Dresser, MD;  Location: [facility] ENDOSCOPY;  Service: Cardiovascular;  Laterality: N/A;   TONSILLECTOMY      Family History  Problem Relation Age of Onset   Early death Father        suicide   Depression Father    Diabetes Father    Cancer Father        prostate   Hearing loss Father        due to war   Alzheimer's disease Mother    Heart disease Brother    Atrial fibrillation Brother    Heart disease Maternal Aunt    Dementia Maternal Aunt    Heart disease Maternal Uncle    Dementia Maternal Grandmother    Heart disease Brother    Atrial fibrillation Brother    Colon cancer Neg Hx    Breast cancer Neg Hx    Ovarian cancer Neg Hx    Pancreatic cancer Neg Hx    Endometrial cancer Neg Hx     Social History   Socioeconomic History   Marital status: Single    Spouse name: Not on file   Number of children: 1   Years of education: Not on file   Highest education level: Bachelor's degree (e.g., BA, AB, BS)  Occupational History   Occupation: retired   Tobacco Use   Smoking status: Former    Packs/day: 0.50    Years: 6.00    Pack years: 3.00    Types: Cigarettes    Quit date: 02/11/1974    Years since quitting: 46.5   Smokeless tobacco: Never  Vaping Use   Vaping Use: Never used  Substance and Sexual Activity   Alcohol use: Yes    Alcohol/week: 0.0 standard drinks    Comment: 2-3 drinks monthly   Drug use: Never   Sexual activity: Not Currently    Comment: Don't use not sexually active  Other Topics Concern   Not on file  Social History Narrative   Raised an adopted child on her own   Working part time reviewed documents    Social Determinants of Radio broadcast assistant Strain: Low Risk    Difficulty of Paying Living Expenses: Not hard at all  Food Insecurity: No Food Insecurity   Worried About Charity fundraiser in the Last Year: Never true   Arboriculturist in the Last Year: Never true  Transportation Needs: No Transportation Needs   Lack of Transportation (Medical): No   Lack of Transportation (Non-Medical): No  Physical Activity: Inactive   Days of Exercise per Week: 0 days   Minutes of Exercise per Session: 0 min  Stress: No Stress Concern Present   Feeling of Stress :  Not at all  Social Connections: Moderately Integrated   Frequency of Communication with Friends and Family: More than three times a week   Frequency of Social Gatherings with Friends and Family: Twice a week   Attends Religious Services: More than 4 times per year   Active Member of [employer] or Organizations: Yes   Attends Music therapist: More than 4 times per year   Marital Status: Never married    Current Medications:  Current Outpatient Medications:    atorvastatin (LIPITOR) 40 MG tablet, Take 1 tablet (40 mg total) by mouth daily., Disp: 90 tablet, Rfl: 1   buPROPion (WELLBUTRIN SR) 150 MG 12 hr tablet, Take 150 mg by mouth 2 (two) times daily., Disp: , Rfl:    citalopram (CELEXA) 40 MG tablet, Take 40 mg by mouth daily., Disp: , Rfl:    clindamycin-benzoyl peroxide (BENZACLIN) gel, Apply topically 2 (two) times daily. (Patient taking differently: Apply 1 application topically 2 (two) times daily as needed (facial acne).), Disp: 50 g, Rfl: 0   clopidogrel (PLAVIX) 75 MG tablet, Take 1 tablet (75 mg total) by mouth daily., Disp: 30 tablet, Rfl: 1   Cyanocobalamin (VITAMIN B-12) 1000 MCG SUBL, Place 1,000 mcg under the tongue at bedtime., Disp: , Rfl:    levothyroxine (SYNTHROID) 75 MCG tablet, Take 1 tablet (75 mcg total) by mouth daily before breakfast., Disp: 90 tablet, Rfl: 1   Multiple Vitamin  (MULTIVITAMIN WITH MINERALS) TABS tablet, Take 1 tablet by mouth daily., Disp: , Rfl:    polyvinyl alcohol (LIQUIFILM TEARS) 1.4 % ophthalmic solution, Place 1 drop into both eyes daily as needed for dry eyes., Disp: , Rfl:    rivaroxaban (XARELTO) 20 MG TABS tablet, Take 20 mg by mouth daily with supper., Disp: , Rfl:    topiramate (TOPAMAX) 25 MG tablet, Take 25 mg by mouth daily. , Disp: , Rfl:    Vitamin D, Ergocalciferol, (DRISDOL) 1.25 MG (50000 UNIT) CAPS capsule, TAKE 1 CAPSULE BY MOUTH EVERY 7 DAYS (Patient taking differently: Take 50,000 Units by mouth every Tuesday.), Disp: 12 capsule, Rfl: 1  Review of Symptoms: Pertinent positives as per HPI.  Physical Exam: There were no vitals taken for this visit. Deferred given limitations of phone visit  Laboratory & Radiologic Studies: Pelvic ultrasound exam on 7/8: IMPRESSION: Thickened heterogeneous endometrial complex with somewhat nodular margins, associated with a 3.8 cm diameter fluid collection within the endometrial canal likely blood products, cannot exclude cervical stenosis or endometrial malignancy; tissue diagnosis recommended.   8.2 cm diameter cyst of the LEFT ovary; consider GYN consult and followup US in 3-6 months, or pelvis MRI w/o and w/ contrast for improved characterization. Note: This recommendation does not apply to premenarchal patients or to those with increased risk (genetic, family history, elevated tumor markers or other high-risk factors) of ovarian cancer. Reference: Radiology 2019 Nov; 293(2):359-371.   These results will be called to the ordering clinician or representative by the Radiologist Assistant, and communication documented in the PACS or [employer].  Assessment & Plan: Natalie [name] is a 70 y.o. woman with an adnexal mass in the setting of recent CVA.  We discussed in detail ultrasound findings, which show an increase in size of the simple appearing adnexal cyst from 5 to 8 cm.   There continue not to be features that raise the concern for malignancy.  The patient has begun having some mild symptoms over the last several weeks that may be related to the increased  size of the cyst.  New since her last ultrasound, the patient has a thickened endometrium with a nodular appearance.  She has not had any vaginal bleeding, but in the setting of findings on ultrasound, I recommend that we proceed with tissue biopsy to rule out hyperplasia or malignancy.  In terms of her adnexal cyst, given simple characteristics, I still think that the risk of surgery is likely higher than any potential benefit.  I think that if endometrial sampling is benign, that we should proceed with pelvic MRI.  If this is overall reassuring, then I think intervention for the adnexal cyst should be if there is significant increase in size or symptoms.  Patient is amenable to coming in for an endometrial biopsy.  We will have the office call her to arrange this in the next couple of weeks.  I discussed the assessment and treatment plan with the patient. The patient was provided with an opportunity to ask questions and all were answered. The patient agreed with the plan and demonstrated an understanding of the instructions.   The patient was advised to call back or see an in-person evaluation if the symptoms worsen or if the condition fails to improve as anticipated.   22 minutes of total time was spent for this patient encounter, including preparation, over-the-phone counseling with the patient and coordination of care, and documentation of the encounter.   Jeral Pinch, MD  Division of Gynecologic Oncology  Department of Obstetrics and Gynecology  [facility]

## 2020-08-13 NOTE — Telephone Encounter (Signed)
Called patient for scheduled phone visit. No answer. Left VM with callback requested.  Jeral Pinch MD Gynecologic Oncology

## 2020-08-13 NOTE — Telephone Encounter (Signed)
Scheduled the patient for a bx apapt

## 2020-08-13 NOTE — Telephone Encounter (Signed)
The patient transferred to the [place] clinic. I cancelled all the remote checks.

## 2020-08-14 ENCOUNTER — Ambulatory Visit: Payer: Medicare Other | Admitting: Gynecologic Oncology

## 2020-08-14 ENCOUNTER — Ambulatory Visit: Payer: Medicare Other | Admitting: Physical Therapy

## 2020-08-14 ENCOUNTER — Other Ambulatory Visit: Payer: Self-pay

## 2020-08-14 DIAGNOSIS — R2689 Other abnormalities of gait and mobility: Secondary | ICD-10-CM

## 2020-08-14 DIAGNOSIS — R278 Other lack of coordination: Secondary | ICD-10-CM | POA: Diagnosis not present

## 2020-08-14 DIAGNOSIS — I63412 Cerebral infarction due to embolism of left middle cerebral artery: Secondary | ICD-10-CM

## 2020-08-14 DIAGNOSIS — M6281 Muscle weakness (generalized): Secondary | ICD-10-CM | POA: Diagnosis not present

## 2020-08-14 DIAGNOSIS — R2681 Unsteadiness on feet: Secondary | ICD-10-CM

## 2020-08-14 NOTE — Therapy (Addendum)
[place] MAIN [facility] SERVICES [address] [place], [place], [address] Phone: [phone]   Fax:  [phone]  Physical Therapy Treatment/Recert  Patient Details  Name: Natalie [name] MRN: [id number] Date of Birth: [birth year] Referring Provider (PT): [name], MD   Encounter Date: 08/14/2020   PT End of Session - 08/23/20 1649     Visit Number 11    Number of Visits 35    Date for PT Re-Evaluation 11/08/20    Authorization Type Traditional Medicare with [employer] Supplemental: VL based on certification    Authorization Time Period 05/21/20-08/13/20    PT Start Time 1615    PT Stop Time 1700    PT Time Calculation (min) 45 min    Equipment Utilized During Treatment Gait belt    Activity Tolerance Patient tolerated treatment well;Patient limited by fatigue    Behavior During Therapy WFL for tasks assessed/performed             Past Medical History:  Diagnosis Date   Acute bronchospasm due to viral infection    Anemia    history of   CVA (cerebral vascular accident) ([place]) 02/28/2020   Depressive disorder    Epistaxis    Fatigue    Hearing loss    Hypothyroidism    Memory change    Migraine    Numbness and tingling    Obesity (BMI 30.0-34.9)    Osteoarthritis    Osteoporosis    Polyuria    Premature menopause    Seizures ([place])    history of mini seizures, possible migraine induced    Past Surgical History:  Procedure Laterality Date   ABLATION  2006   BUBBLE STUDY  04/23/2020   Procedure: BUBBLE STUDY;  Surgeon: Larey Dresser, MD;  Location: [place];  Service: Cardiovascular;;   CATARACT EXTRACTION W/ INTRAOCULAR LENS IMPLANT Bilateral    COLONOSCOPY     COLONOSCOPY WITH PROPOFOL N/A 12/22/2018   Procedure: COLONOSCOPY WITH PROPOFOL;  Surgeon: Virgel Manifold, MD;  Location: [facility] ENDOSCOPY;  Service: Endoscopy;  Laterality: N/A;   EYE SURGERY  Spring 2018   Cataracts both eyes   LOOP RECORDER  INSERTION N/A 04/23/2020   Procedure: LOOP RECORDER INSERTION;  Surgeon: Evans Lance, MD;  Location: [place] CV LAB;  Service: Cardiovascular;  Laterality: N/A;   TEE WITHOUT CARDIOVERSION N/A 04/23/2020   Procedure: TRANSESOPHAGEAL ECHOCARDIOGRAM (TEE);  Surgeon: Larey Dresser, MD;  Location: [facility] ENDOSCOPY;  Service: Cardiovascular;  Laterality: N/A;   TONSILLECTOMY      There were no vitals filed for this visit.      Treatment:    Neuro Re-ed: close CGA for all of the following Standing with CGA next to support surface: Airex pad: horizontal head turns 30 seconds scanning room 10x ; cueing for arc of motion Airex pad: vertical head turns 30 seconds, cueing for arc of motion, noticeable sway with upward gaze increasing demand on ankle righting reaction musculature Airex pad: narrow BOS 30 seconds Airex pad: dual task of listing states and counting backwards by 3 from 50; increased forward trunk flexion and sway  Amb. Down hallway reading stick notes (horizontal head turns) - no decrease in postural stability, no LOB Amb. With vertical, horizontal head turns with hold for 3 steps, cuing for heel strike. Performed with lateral deviation, more with horizontal than vertical. Dual task amb. - to list names in alphabetical order and recite words that rhyme with "cat"   Forward  lunges with leading foot on airex pad - 8 reps BLE; no UE support. CGA to stabilize.   SLB - 2x30 sec BLEs; today requires frequent intermittent UE support and CGA to steady (harder today than usual) Tandem stance - x30 sec BLE Tandem stance with vertical (10x) horizontal head turns (10x each direction with each LE).   Side-to-side stepping over hurdle - 15x  TherEx: cues for body mechanics and sequencing.  STS 10x  - increased rocking momentum required for final 4 reps    Assessment: Pt arrived to PT session today with low energy. She required increased frequency of seated rest breaks between  exercises. She was able to complete the first set of each exercise with increased stability compared to second set, fatigue noted in BLE via increased shaking and hip drop. She performed lateral stepping over hurdle with improved performance compared to previous sessions, VC for step length. During ambulation exercises, gait speed decreased significantly during dual tasking and horizontal head turns. Close SUP-MIN A was provided for all exercises. Pt will benefit from further skilled PT to improve strength, balance and gait in order to increase ease and safety with all functional mobility.      PT Short Term Goals - 08/23/20 1651       PT SHORT TERM GOAL #1   Title Pt will be independent with HEP in order to improve strength and balance in order to decrease fall risk and improve function at home and work.    Baseline 4/18: to be initiated 7/6: HEP compliant, new HEP given    Time 6    Period Weeks    Status Partially Met    Target Date 10/04/20               PT Long Term Goals - 08/23/20 1652       PT LONG TERM GOAL #1   Title Pt will improve DGI by at least 3 points in order to demonstrate clinically significant improvement in balance and decreased risk for falls.    Baseline 4/18: 18/24; 7/12: unstable BP, defer to next visit    Time 12    Period Weeks    Status New    Target Date 11/08/20      PT LONG TERM GOAL #2   Title Pt will improve ABC scale by at least 13% in order to demonstrate clinically significant improvement in balance confidence.    Baseline 4/18: 63.75% 7/6: 74%    Time 12    Period Weeks    Status On-going    Target Date 11/08/20      PT LONG TERM GOAL #3   Title Pt will demonstrate improved ability to maintain balance on compliant surface with EC for at least 5 seconds to indicate improved ability to navigate uneven surfaces and ambulate in dimly lit rooms.    Baseline 4/18: Pt unable to maintain balance with EC on foam on CTSIB-M 7/6: met    Time 12     Period Weeks    Status Achieved      PT LONG TERM GOAL #4   Title Patient will increase BLE gross strength to 4+/5 as to improve functional strength for independent gait, increased standing tolerance and increased ADL ability.    Baseline 4/18: gross BLE strength currnelty 4/5 7/6: hips 4+/5 ankles 4/5    Time 12    Period Weeks    Status On-going    Target Date 11/08/20      PT  LONG TERM GOAL #5   Title Patient will increase FOTO score to equal to or greater than 80 to demonstrate improvement in mobility and quality of life.    Baseline 4/18: 74% 7/6: 59%    Time 12    Period Weeks    Status On-going    Target Date 11/08/20      PT LONG TERM GOAL #6   Title Patient will reduce dizziness handicap inventory score to <10, for less dizziness with ADLs and increased safety with home and work tasks.    Baseline 4/27: DHI score is 16, indicating mild handicap 7/6: 22%    Time 11    Period Weeks    Status On-going    Target Date 11/08/20                   Plan - 08/23/20 1651     Clinical Impression Statement Pt arrived to PT session today with low energy. She required increased frequency of seated rest breaks between exercises. She was able to complete the first set of each exercise with increased stability compared to second set, fatigue noted in BLE via increased shaking and hip drop. She performed lateral stepping over hurdle with improved performance compared to previous sessions, VC for step length. During ambulation exercises, gait speed decreased significantly during dual tasking and horizontal head turns. Close SUP-MIN A was provided for all exercises. Pt will benefit from further skilled PT to improve strength, balance and gait in order to increase ease and safety with all functional mobility. Goals addressed on 7/6; plesae refer to this note for further detail.    Personal Factors and Comorbidities Age;Comorbidity 1;Comorbidity 2;Past/Current Experience;Sex;Social  Background;Behavior Pattern;Comorbidity 3+    Comorbidities CVA in Jan 2022, anxiety, hypothyroidism, depression, OA, osteoporosis, memory change, migraine, seizures, hearing loss.    Examination-Activity Limitations Bathing;Stairs;Stand;Hygiene/Grooming;Locomotion Level;Carry;Bed Mobility;Other;Reach Overhead;Lift    Examination-Participation Restrictions Community Activity;Yard Work;Shop;Cleaning;Laundry;Medication Management;Meal Prep    Stability/Clinical Decision Making Evolving/Moderate complexity    Rehab Potential Good    PT Frequency 2x / week    PT Duration 12 weeks    PT Treatment/Interventions ADLs/Self Care Home Management;Biofeedback;Canalith Repostioning;Cryotherapy;Electrical Stimulation;Aquatic Therapy;Moist Heat;Ultrasound;DME Instruction;Gait training;Stair training;Functional mobility training;Therapeutic activities;Therapeutic exercise;Balance training;Neuromuscular re-education;Cognitive remediation;Patient/family education;Orthotic Fit/Training;Manual techniques;Passive range of motion;Energy conservation;Taping;Splinting;Vestibular;Visual/perceptual remediation/compensation;Joint Manipulations    PT Next Visit Plan DGI    PT Home Exercise Plan Access Code: [email or web address], added:  Access Code: [email or web address]    Consulted and Agree with Plan of Care Patient             Patient will benefit from skilled therapeutic intervention in order to improve the following deficits and impairments:  Abnormal gait, Decreased balance, Decreased endurance, Difficulty walking, Decreased range of motion, Dizziness, Improper body mechanics, Impaired vision/preception, Decreased coordination, Decreased strength, Decreased mobility, Obesity, Decreased activity tolerance, Decreased knowledge of use of DME, Impaired flexibility, Postural dysfunction  Visit Diagnosis: Unsteadiness on feet  Muscle weakness (generalized)  Other abnormalities of gait and mobility  Cerebrovascular accident (CVA) due to  embolism of left middle cerebral artery (HCC)     Problem List Patient Active Problem List   Diagnosis Date Noted   Thickened endometrium 08/13/2020   Acute ITP (HCC) 05/02/2020   Other intra-abdominal and pelvic swelling, mass and lump 04/26/2020   Pelvic mass in female 04/25/2020   Ovarian cancer screening 04/25/2020   Ovarian mass, left 04/25/2020   Stroke (cerebrum) (HCC) 04/20/2020   Acute CVA (cerebrovascular accident) (HCC) 04/19/2020   CVA (cerebral vascular accident) (  HCC) 04/19/2020   History of ischemic multifocal posterior circulation stroke 02/29/2020   Thrombocytopenia (HCC) 02/28/2020   HPV test positive 07/08/2015   Difficulty hearing 06/28/2015   Arthritis, degenerative 06/28/2015   Obesity, Class I, BMI 30-34.9 06/28/2015   Paresthesia 06/28/2015   Purpura, nonthrombopenic (HCC) 06/28/2015   Other specified hypothyroidism 08/09/2014   Migraine without aura and without status migrainosus, not intractable 09/19/2008   Moderate major depression (HCC) 09/07/2006   OP (osteoporosis) 09/07/2006     PT, DPT   L  08/23/2020, 4:54 PM   [facility] [address] [place], [place], [address] Phone: [phone]   Fax:  [phone]  Name: Natalie [name] MRN: [id number] Date of Birth: 09/01/1950    

## 2020-08-15 NOTE — Progress Notes (Signed)
Name: Natalie [name]   MRN: [id number]    DOB: [birth year]   Date:08/16/2020       Progress Note  Subjective  Chief Complaint  Follow Up  HPI  MDD: she is under the care of the psychiatrist, she takes medications as prescribed. She has been under more stress with her health also worried about possibility of ovarian cancer. She is under the care of Dr. Thurmond Butts but since he is about the retire she asked if I can manage her medications., she brough a letter today with his recommendations to continue prescribing her medications. She is under a lot of stress but states coping well with new diagnosis   B12 deficiency: she is taking B12 supplementation   Hypothyroidism: she has been taking medication as prescribed, she states hair is not falling as much, she has intermittent  dry skin, no constipation . Last TSH was at goal  - done recently at [facility]   Dyslipidemia/history of CVA times two: she is now taking statin therapy, she had two strokes since January 21 and March 21. She was on Plavix and aspirin, currently on Xarelto and Plavix and off aspirin.   HPV positive/High CA 125/ovarian mass : seen by Dr. Alvy Bimler , recent evaluation done by Dr. Jeral Pinch and had repeat pelvic US that showed new nodulation on endometrium , she will have an endometrial biopsy and possibley MRI pelvis , holding off on surgery due to recent strokes   ITP: she is seeing hematologist, she took prednisone her levels are normal now   Obesity  she has lost 30 lbs over the past 6 months and 17 lbs in the psat 3 months , with new diagnosis of ovarian mass and high CA125. She used to be morbid obese but now in the obseity category, due to her medical problems, advised to add protein shakes and try to not lose any more weight   Malnutrition: she has lost 47 lbs int he past 9 months, discussed keepign weight stable  Patient Active Problem List   Diagnosis Date Noted   Thickened endometrium 08/13/2020   Acute ITP ([place])  05/02/2020   Other intra-abdominal and pelvic swelling, mass and lump 04/26/2020   Pelvic mass in female 04/25/2020   Ovarian cancer screening 04/25/2020   Ovarian mass, left 04/25/2020   Stroke (cerebrum) ([place]) 04/20/2020   Acute CVA (cerebrovascular accident) ([place]) 04/19/2020   CVA (cerebral vascular accident) ([place]) 04/19/2020   History of ischemic multifocal posterior circulation stroke 02/29/2020   Thrombocytopenia ([place]) 02/28/2020   HPV test positive 07/08/2015   Difficulty hearing 06/28/2015   Arthritis, degenerative 06/28/2015   Obesity, Class I, BMI 30-34.9 06/28/2015   Paresthesia 06/28/2015   Purpura, nonthrombopenic ([place]) 06/28/2015   Other specified hypothyroidism 08/09/2014   Migraine without aura and without status migrainosus, not intractable 09/19/2008   Moderate major depression ([place]) 09/07/2006   OP (osteoporosis) 09/07/2006    Past Surgical History:  Procedure Laterality Date   ABLATION  2006   BUBBLE STUDY  04/23/2020   Procedure: BUBBLE STUDY;  Surgeon: Larey Dresser, MD;  Location: [facility];  Service: Cardiovascular;;   CATARACT EXTRACTION W/ INTRAOCULAR LENS IMPLANT Bilateral    COLONOSCOPY     COLONOSCOPY WITH PROPOFOL N/A 12/22/2018   Procedure: COLONOSCOPY WITH PROPOFOL;  Surgeon: Virgel Manifold, MD;  Location: [facility] ENDOSCOPY;  Service: Endoscopy;  Laterality: N/A;   EYE SURGERY  Spring 2018   Cataracts both eyes   LOOP RECORDER INSERTION N/A 04/23/2020  Procedure: LOOP RECORDER INSERTION;  Surgeon: Evans Lance, MD;  Location: [facility];  Service: Cardiovascular;  Laterality: N/A;   TEE WITHOUT CARDIOVERSION N/A 04/23/2020   Procedure: TRANSESOPHAGEAL ECHOCARDIOGRAM (TEE);  Surgeon: Larey Dresser, MD;  Location: [facility] ENDOSCOPY;  Service: Cardiovascular;  Laterality: N/A;   TONSILLECTOMY      Family History  Problem Relation Age of Onset   Early death Father        suicide   Depression Father    Diabetes Father    Cancer  Father        prostate   Hearing loss Father        due to war   Alzheimer's disease Mother    Heart disease Brother    Atrial fibrillation Brother    Heart disease Maternal Aunt    Dementia Maternal Aunt    Heart disease Maternal Uncle    Dementia Maternal Grandmother    Heart disease Brother    Atrial fibrillation Brother    Colon cancer Neg Hx    Breast cancer Neg Hx    Ovarian cancer Neg Hx    Pancreatic cancer Neg Hx    Endometrial cancer Neg Hx     Social History   Tobacco Use   Smoking status: Former    Packs/day: 0.50    Years: 6.00    Pack years: 3.00    Types: Cigarettes    Quit date: 02/11/1974    Years since quitting: 46.5   Smokeless tobacco: Never  Substance Use Topics   Alcohol use: Yes    Alcohol/week: 0.0 standard drinks    Comment: 2-3 drinks monthly     Current Outpatient Medications:    atorvastatin (LIPITOR) 40 MG tablet, Take 1 tablet (40 mg total) by mouth daily., Disp: 90 tablet, Rfl: 1   buPROPion (WELLBUTRIN SR) 150 MG 12 hr tablet, Take 150 mg by mouth 2 (two) times daily., Disp: , Rfl:    citalopram (CELEXA) 40 MG tablet, Take 40 mg by mouth daily., Disp: , Rfl:    clindamycin-benzoyl peroxide (BENZACLIN) gel, Apply topically 2 (two) times daily. (Patient taking differently: Apply 1 application topically 2 (two) times daily as needed (facial acne).), Disp: 50 g, Rfl: 0   clopidogrel (PLAVIX) 75 MG tablet, Take 1 tablet (75 mg total) by mouth daily., Disp: 30 tablet, Rfl: 1   Cyanocobalamin (VITAMIN B-12) 1000 MCG SUBL, Place 1,000 mcg under the tongue at bedtime., Disp: , Rfl:    levothyroxine (SYNTHROID) 75 MCG tablet, Take 1 tablet (75 mcg total) by mouth daily before breakfast., Disp: 90 tablet, Rfl: 1   Multiple Vitamin (MULTIVITAMIN WITH MINERALS) TABS tablet, Take 1 tablet by mouth daily., Disp: , Rfl:    polyvinyl alcohol (LIQUIFILM TEARS) 1.4 % ophthalmic solution, Place 1 drop into both eyes daily as needed for dry eyes., Disp: , Rfl:     rivaroxaban (XARELTO) 20 MG TABS tablet, Take 20 mg by mouth daily with supper., Disp: , Rfl:    topiramate (TOPAMAX) 25 MG tablet, Take 25 mg by mouth daily. , Disp: , Rfl:    Vitamin D, Ergocalciferol, (DRISDOL) 1.25 MG (50000 UNIT) CAPS capsule, TAKE 1 CAPSULE BY MOUTH EVERY 7 DAYS (Patient taking differently: Take 50,000 Units by mouth every Tuesday.), Disp: 12 capsule, Rfl: 1  Allergies  Allergen Reactions   Sulfamethoxazole-Trimethoprim Hives    I personally reviewed active problem list, medication list, allergies, family history, social history, health maintenance with the patient/caregiver today.  ROS  Constitutional: Negative for fever or weight change.  Respiratory: Negative for cough and shortness of breath.   Cardiovascular: Negative for chest pain or palpitations.  Gastrointestinal: positive for abdominal /pelvic pain, no bowel changes.  Musculoskeletal: positive for gait problem but no  joint swelling.  Skin: Negative for rash.  Neurological: Negative for dizziness or headache.  No other specific complaints in a complete review of systems (except as listed in HPI above).   Objective  Vitals:   08/16/20 1001  BP: 112/82  Pulse: 88  Resp: 16  Temp: 98.2 F (36.8 C)  SpO2: 96%  Weight: 186 lb (84.4 kg)  Height: 5\' 6"  (1.676 m)    Body mass index is 30.02 kg/m.  Physical Exam  Constitutional: Patient appears well-developed . Malnourished, temporal waisting  No distress.  HEENT: head atraumatic, normocephalic, pupils equal and reactive to light, neck supple Cardiovascular: Normal rate, regular rhythm and normal heart sounds.  No murmur heard. No BLE edema. Pulmonary/Chest: Effort normal and breath sounds normal. No respiratory distress. Abdominal: Soft.  There is no tenderness. Psychiatric: Patient has a normal mood and affect. behavior is normal. Judgment and thought content normal.   Recent Results (from the past 2160 hour(s))  CUP PACEART REMOTE  DEVICE CHECK     Status: None   Collection Time: 05/28/20 10:17 AM  Result Value Ref Range   Date Time Interrogation Session [id number]    Pulse Generator Manufacturer MERM    Pulse Gen Model LNQ22 LINQ II    Pulse Gen Serial Number [id number]    Clinic [facility]    Implantable Pulse Generator Type ICM/ILR    Implantable Pulse Generator Implant Date [id number]   CBC with Differential/Platelet     Status: None   Collection Time: 05/30/20  9:42 AM  Result Value Ref Range   WBC 6.1 4.0 - 10.5 K/uL   RBC 4.24 3.87 - 5.11 MIL/uL   Hemoglobin 13.2 12.0 - 15.0 g/dL   HCT 41.5 36.0 - 46.0 %   MCV 97.9 80.0 - 100.0 fL   MCH 31.1 26.0 - 34.0 pg   MCHC 31.8 30.0 - 36.0 g/dL   RDW 12.5 11.5 - 15.5 %   Platelets 202 150 - 400 K/uL   nRBC 0.0 0.0 - 0.2 %   Neutrophils Relative % 60 %   Neutro Abs 3.7 1.7 - 7.7 K/uL   Lymphocytes Relative 31 %   Lymphs Abs 1.9 0.7 - 4.0 K/uL   Monocytes Relative 5 %   Monocytes Absolute 0.3 0.1 - 1.0 K/uL   Eosinophils Relative 3 %   Eosinophils Absolute 0.2 0.0 - 0.5 K/uL   Basophils Relative 1 %   Basophils Absolute 0.0 0.0 - 0.1 K/uL   Immature Granulocytes 0 %   Abs Immature Granulocytes 0.01 0.00 - 0.07 K/uL    Comment: Performed at [facility], [place] [address], [place], [place] [address]  CBC with Differential/Platelet     Status: None   Collection Time: 06/25/20 11:20 AM  Result Value Ref Range   WBC 5.8 4.0 - 10.5 K/uL   RBC 4.12 3.87 - 5.11 MIL/uL   Hemoglobin 12.8 12.0 - 15.0 g/dL   HCT 39.4 36.0 - 46.0 %   MCV 95.6 80.0 - 100.0 fL   MCH 31.1 26.0 - 34.0 pg   MCHC 32.5 30.0 - 36.0 g/dL   RDW 12.2 11.5 - 15.5 %   Platelets 209 150 - 400 K/uL  nRBC 0.0 0.0 - 0.2 %   Neutrophils Relative % 60 %   Neutro Abs 3.5 1.7 - 7.7 K/uL   Lymphocytes Relative 29 %   Lymphs Abs 1.7 0.7 - 4.0 K/uL   Monocytes Relative 6 %   Monocytes Absolute 0.4 0.1 - 1.0 K/uL   Eosinophils Relative 4 %   Eosinophils Absolute  0.2 0.0 - 0.5 K/uL   Basophils Relative 1 %   Basophils Absolute 0.0 0.0 - 0.1 K/uL   Immature Granulocytes 0 %   Abs Immature Granulocytes 0.01 0.00 - 0.07 K/uL    Comment: Performed at [facility], [place] [address] [place], [place] [address]  CUP PACEART REMOTE DEVICE CHECK     Status: None   Collection Time: 06/30/20 10:15 AM  Result Value Ref Range   Date Time Interrogation Session [id number]    Pulse Generator Manufacturer MERM    Pulse Gen Model LNQ22 LINQ II    Pulse Gen Serial Number [id number]    Clinic Name [facility]    Implantable Pulse Generator Type ICM/ILR    Implantable Pulse Generator Implant Date [id number]   Basic metabolic panel     Status: Abnormal   Collection Time: 07/17/20 11:33 AM  Result Value Ref Range   Sodium 138 135 - 145 mmol/L   Potassium 4.0 3.5 - 5.1 mmol/L   Chloride 104 98 - 111 mmol/L   CO2 27 22 - 32 mmol/L   Glucose, Bld 104 (H) 70 - 99 mg/dL    Comment: Glucose reference range applies only to samples taken after fasting for at least 8 hours.   BUN 15 8 - 23 mg/dL   Creatinine, Ser 0.83 0.44 - 1.00 mg/dL   Calcium 9.0 8.9 - 10.3 mg/dL   GFR, Estimated >60 >60 mL/min    Comment: (NOTE) Calculated using the CKD-EPI Creatinine Equation (2021)    Anion gap 7 5 - 15    Comment: Performed at [facility], [address] [place], [place] [address]  CBC     Status: None   Collection Time: 07/17/20 11:33 AM  Result Value Ref Range   WBC 6.4 4.0 - 10.5 K/uL   RBC 4.10 3.87 - 5.11 MIL/uL   Hemoglobin 12.9 12.0 - 15.0 g/dL   HCT 39.4 36.0 - 46.0 %   MCV 96.1 80.0 - 100.0 fL   MCH 31.5 26.0 - 34.0 pg   MCHC 32.7 30.0 - 36.0 g/dL   RDW 12.3 11.5 - 15.5 %   Platelets 222 150 - 400 K/uL   nRBC 0.0 0.0 - 0.2 %    Comment: Performed at [facility], [address] [place], [place] [address]  Basic metabolic panel     Status: Abnormal   Collection Time: 08/05/20  9:54 AM  Result Value Ref Range    Sodium 137 135 - 145 mmol/L   Potassium 4.6 3.5 - 5.1 mmol/L   Chloride 104 98 - 111 mmol/L   CO2 26 22 - 32 mmol/L   Glucose, Bld 118 (H) 70 - 99 mg/dL    Comment: Glucose reference range applies only to samples taken after fasting for at least 8 hours.   BUN 16 8 - 23 mg/dL   Creatinine, Ser 0.81 0.44 - 1.00 mg/dL   Calcium 8.9 8.9 - 10.3 mg/dL   GFR, Estimated >60 >60 mL/min    Comment: (NOTE) Calculated using the CKD-EPI Creatinine Equation (2021)    Anion gap 7  5 - 15    Comment: Performed at [facility], [address] [place], [place] [address]  CBC     Status: None   Collection Time: 08/05/20  9:54 AM  Result Value Ref Range   WBC 6.4 4.0 - 10.5 K/uL   RBC 3.91 3.87 - 5.11 MIL/uL   Hemoglobin 12.2 12.0 - 15.0 g/dL   HCT 38.0 36.0 - 46.0 %   MCV 97.2 80.0 - 100.0 fL   MCH 31.2 26.0 - 34.0 pg   MCHC 32.1 30.0 - 36.0 g/dL   RDW 12.2 11.5 - 15.5 %   Platelets 242 150 - 400 K/uL   nRBC 0.0 0.0 - 0.2 %    Comment: Performed at [facility], [place], [place] [address]  Troponin I (High Sensitivity)     Status: None   Collection Time: 08/05/20  9:54 AM  Result Value Ref Range   Troponin I (High Sensitivity) 7 <18 ng/L    Comment: (NOTE) Elevated high sensitivity troponin I (hsTnI) values and significant  changes across serial measurements may suggest ACS but many other  chronic and acute conditions are known to elevate hsTnI results.  Refer to the "Links" section for chest pain algorithms and additional  guidance. Performed at [facility], [address] [place], [place] [address]   TSH     Status: None   Collection Time: 08/05/20 12:13 PM  Result Value Ref Range   TSH 1.015 0.350 - 4.500 uIU/mL    Comment: Performed by a 3rd Generation assay with a functional sensitivity of <=0.01 uIU/mL. Performed at [facility], [address] [place], [place] [address]   T4, free     Status: Abnormal   Collection  Time: 08/05/20 12:13 PM  Result Value Ref Range   Free T4 1.14 (H) 0.61 - 1.12 ng/dL    Comment: (NOTE) Biotin ingestion may interfere with free T4 tests. If the results are inconsistent with the TSH level, previous test results, or the clinical presentation, then consider biotin interference. If needed, order repeat testing after stopping biotin. Performed at [facility], [address] [place], [place] [address]   Hepatic function panel     Status: Abnormal   Collection Time: 08/05/20 12:13 PM  Result Value Ref Range   Total Protein 6.6 6.5 - 8.1 g/dL   Albumin 3.8 3.5 - 5.0 g/dL   AST 21 15 - 41 U/L   ALT 16 0 - 44 U/L   Alkaline Phosphatase 130 (H) 38 - 126 U/L   Total Bilirubin 0.7 0.3 - 1.2 mg/dL   Bilirubin, Direct 0.1 0.0 - 0.2 mg/dL   Indirect Bilirubin 0.6 0.3 - 0.9 mg/dL    Comment: Performed at [facility], [place], [place] [address]  Troponin I (High Sensitivity)     Status: None   Collection Time: 08/05/20 12:13 PM  Result Value Ref Range   Troponin I (High Sensitivity) 8 <18 ng/L    Comment: (NOTE) Elevated high sensitivity troponin I (hsTnI) values and significant  changes across serial measurements may suggest ACS but many other  chronic and acute conditions are known to elevate hsTnI results.  Refer to the "Links" section for chest pain algorithms and additional  guidance. Performed at [facility], [address] [place],  [address]   Resp Panel by RT-PCR (Flu A&B, Covid) Nasopharyngeal Swab     Status: None   Collection Time: 08/05/20  12:13 PM   Specimen: Nasopharyngeal Swab; Nasopharyngeal(NP) swabs in vial transport medium  Result Value Ref Range   SARS Coronavirus 2 by RT PCR NEGATIVE NEGATIVE    Comment: (NOTE) SARS-CoV-2 target nucleic acids are NOT DETECTED.  The SARS-CoV-2 RNA is generally detectable in upper respiratory specimens during the acute phase of infection. The  lowest concentration of SARS-CoV-2 viral copies this assay can detect is 138 copies/mL. A negative result does not preclude SARS-Cov-2 infection and should not be used as the sole basis for treatment or other patient management decisions. A negative result may occur with  improper specimen collection/handling, submission of specimen other than nasopharyngeal swab, presence of viral mutation(s) within the areas targeted by this assay, and inadequate number of viral copies(<138 copies/mL). A negative result must be combined with clinical observations, patient history, and epidemiological information. The expected result is Negative.  Fact Sheet for Patients:  EntrepreneurPulse.com.au  Fact Sheet for Healthcare Providers:  IncredibleEmployment.be  This test is no t yet approved or cleared by the [place] FDA and  has been authorized for detection and/or diagnosis of SARS-CoV-2 by FDA under an Emergency Use Authorization (EUA). This EUA will remain  in effect (meaning this test can be used) for the duration of the COVID-19 declaration under Section 564(b)(1) of the Act, 21 U.S.C.section 360bbb-3(b)(1), unless the authorization is terminated  or revoked sooner.       Influenza A by PCR NEGATIVE NEGATIVE   Influenza B by PCR NEGATIVE NEGATIVE    Comment: (NOTE) The Xpert Xpress SARS-CoV-2/FLU/RSV plus assay is intended as an aid in the diagnosis of influenza from Nasopharyngeal swab specimens and should not be used as a sole basis for treatment. Nasal washings and aspirates are unacceptable for Xpert Xpress SARS-CoV-2/FLU/RSV testing.  Fact Sheet for Patients: EntrepreneurPulse.com.au  Fact Sheet for Healthcare Providers: IncredibleEmployment.be  This test is not yet approved or cleared by the [place] FDA and has been authorized for detection and/or diagnosis of SARS-CoV-2 by FDA under an Emergency  Use Authorization (EUA). This EUA will remain in effect (meaning this test can be used) for the duration of the COVID-19 declaration under Section 564(b)(1) of the Act, 21 U.S.C. section 360bbb-3(b)(1), unless the authorization is terminated or revoked.  Performed at [facility], [address] [place], [place] [address]   CUP PACEART REMOTE DEVICE CHECK     Status: None   Collection Time: 08/05/20 11:09 PM  Result Value Ref Range   Date Time Interrogation Session [phone]    Pulse Generator Manufacturer MERM    Pulse Gen Model LNQ22 LINQ II    Pulse Gen Serial Number [id number]    Clinic Name [facility] Pulse Generator Type ICM/ILR    Implantable Pulse Generator Implant Date [id number]      PHQ2/9: Depression screen [facility] 2/9 08/16/2020 05/11/2020 03/08/2020 01/25/2020 12/20/2019  Decreased Interest 0 0 0 0 1  Down, Depressed, Hopeless 0 0 0 0 1  PHQ - 2 Score 0 0 0 0 2  Altered sleeping 0 0 1 0 0  Tired, decreased energy 0 1 1 1  0  Change in appetite 0 0 0 1 0  Feeling bad or failure about yourself  0 0 0 0 0  Trouble concentrating 0 1 0 0 0  Moving slowly or fidgety/restless 0 0 0 0 1  Suicidal thoughts 0 0 0 0 0  PHQ-9 Score 0 2 2 2 3   Difficult doing work/chores - - - -  Not difficult at all  Some recent data might be hidden    phq 9 is negative   Fall Risk: Fall Risk  08/16/2020 05/11/2020 03/08/2020 01/25/2020 12/20/2019  Falls in the past year? 1 0 0 1 1  Number falls in past yr: 0 0 0 0 1  Injury with Fall? 1 0 0 0 0  Comment - - - - -  Risk for fall due to : - - - - History of fall(s)  Risk for fall due to: Comment - - - - -  Follow up - - - - Falls prevention discussed     Functional Status Survey: Is the patient deaf or have difficulty hearing?: Yes Does the patient have difficulty seeing, even when wearing glasses/contacts?: No Does the patient have difficulty concentrating, remembering, or making decisions?: No Does the  patient have difficulty walking or climbing stairs?: Yes Does the patient have difficulty dressing or bathing?: No Does the patient have difficulty doing errands alone such as visiting a doctor's office or shopping?: No    Assessment & Plan  1. Recurrent cerebrovascular accidents (CVAs) ([place])  Advised to hold off on having surgeries right away due to the risk of recurrence  2. Adult hypothyroidism  At goal   3. B12 deficiency   4. Ovarian mass  Keep follow up with gyn and oncologist   5. History of CVA in adulthood  - atorvastatin (LIPITOR) 40 MG tablet; Take 1 tablet (40 mg total) by mouth daily.  Dispense: 90 tablet; Refill: 1  6. Major depression, recurrent, chronic (HCC)  Stable, she has a good support system  7. Vitamin D deficiency   8. Moderate protein-calorie malnutrition ([place])  Discussed protein intake  9. Dyslipidemia  - atorvastatin (LIPITOR) 40 MG tablet; Take 1 tablet (40 mg total) by mouth daily.  Dispense: 90 tablet; Refill: 1

## 2020-08-16 ENCOUNTER — Other Ambulatory Visit: Payer: Self-pay

## 2020-08-16 ENCOUNTER — Ambulatory Visit (INDEPENDENT_AMBULATORY_CARE_PROVIDER_SITE_OTHER): Payer: Medicare Other | Admitting: Family Medicine

## 2020-08-16 ENCOUNTER — Encounter: Payer: Self-pay | Admitting: Family Medicine

## 2020-08-16 VITALS — BP 112/82 | HR 88 | Temp 98.2°F | Resp 16 | Ht 66.0 in | Wt 186.0 lb

## 2020-08-16 DIAGNOSIS — E039 Hypothyroidism, unspecified: Secondary | ICD-10-CM

## 2020-08-16 DIAGNOSIS — F339 Major depressive disorder, recurrent, unspecified: Secondary | ICD-10-CM | POA: Diagnosis not present

## 2020-08-16 DIAGNOSIS — E785 Hyperlipidemia, unspecified: Secondary | ICD-10-CM | POA: Diagnosis not present

## 2020-08-16 DIAGNOSIS — E538 Deficiency of other specified B group vitamins: Secondary | ICD-10-CM

## 2020-08-16 DIAGNOSIS — E559 Vitamin D deficiency, unspecified: Secondary | ICD-10-CM

## 2020-08-16 DIAGNOSIS — I639 Cerebral infarction, unspecified: Secondary | ICD-10-CM

## 2020-08-16 DIAGNOSIS — Z8673 Personal history of transient ischemic attack (TIA), and cerebral infarction without residual deficits: Secondary | ICD-10-CM

## 2020-08-16 DIAGNOSIS — N838 Other noninflammatory disorders of ovary, fallopian tube and broad ligament: Secondary | ICD-10-CM

## 2020-08-16 MED ORDER — ATORVASTATIN CALCIUM 40 MG PO TABS: 40.0000 mg | ORAL_TABLET | Freq: Every day | ORAL | 1 refills | Status: DC

## 2020-08-17 ENCOUNTER — Ambulatory Visit: Payer: Medicare Other | Admitting: Family Medicine

## 2020-08-21 ENCOUNTER — Other Ambulatory Visit: Payer: Self-pay

## 2020-08-21 ENCOUNTER — Ambulatory Visit: Payer: Medicare Other

## 2020-08-21 DIAGNOSIS — M6281 Muscle weakness (generalized): Secondary | ICD-10-CM

## 2020-08-21 DIAGNOSIS — R2681 Unsteadiness on feet: Secondary | ICD-10-CM

## 2020-08-21 DIAGNOSIS — R2689 Other abnormalities of gait and mobility: Secondary | ICD-10-CM

## 2020-08-21 DIAGNOSIS — R278 Other lack of coordination: Secondary | ICD-10-CM | POA: Diagnosis not present

## 2020-08-21 DIAGNOSIS — I63412 Cerebral infarction due to embolism of left middle cerebral artery: Secondary | ICD-10-CM | POA: Diagnosis not present

## 2020-08-21 NOTE — Therapy (Signed)
[place] MAIN [facility] SERVICES [address] [place], [place], [address] Phone: [phone]   Fax:  [phone]  Physical Therapy Treatment  Patient Details  Name: Natalie [name] MRN: [id number] Date of Birth: 28-Oct-1950 Referring Provider (PT): [name], MD   Encounter Date: 08/21/2020   PT End of Session - 08/21/20 1528     Visit Number 12    Number of Visits 25    Date for PT Re-Evaluation 08/13/20    Authorization Type Traditional Medicare with [employer] Supplemental: VL based on certification    Authorization Time Period 05/21/20-08/13/20    PT Start Time 1515    PT Stop Time 1559    PT Time Calculation (min) 44 min    Equipment Utilized During Treatment Gait belt    Activity Tolerance Patient tolerated treatment well;Patient limited by fatigue    Behavior During Therapy WFL for tasks assessed/performed             Past Medical History:  Diagnosis Date   Acute bronchospasm due to viral infection    Anemia    history of   CVA (cerebral vascular accident) ([place]) 02/28/2020   Depressive disorder    Epistaxis    Fatigue    Hearing loss    Hypothyroidism    Memory change    Migraine    Numbness and tingling    Obesity (BMI 30.0-34.9)    Osteoarthritis    Osteoporosis    Polyuria    Premature menopause    Seizures ([place])    history of mini seizures, possible migraine induced    Past Surgical History:  Procedure Laterality Date   ABLATION  2006   BUBBLE STUDY  04/23/2020   Procedure: BUBBLE STUDY;  Surgeon: Larey Dresser, MD;  Location: [place];  Service: Cardiovascular;;   CATARACT EXTRACTION W/ INTRAOCULAR LENS IMPLANT Bilateral    COLONOSCOPY     COLONOSCOPY WITH PROPOFOL N/A 12/22/2018   Procedure: COLONOSCOPY WITH PROPOFOL;  Surgeon: Virgel Manifold, MD;  Location: [facility] ENDOSCOPY;  Service: Endoscopy;  Laterality: N/A;   EYE SURGERY  Spring 2018   Cataracts both eyes   LOOP RECORDER INSERTION N/A  04/23/2020   Procedure: LOOP RECORDER INSERTION;  Surgeon: Evans Lance, MD;  Location: [place] CV LAB;  Service: Cardiovascular;  Laterality: N/A;   TEE WITHOUT CARDIOVERSION N/A 04/23/2020   Procedure: TRANSESOPHAGEAL ECHOCARDIOGRAM (TEE);  Surgeon: Larey Dresser, MD;  Location: [facility] ENDOSCOPY;  Service: Cardiovascular;  Laterality: N/A;   TONSILLECTOMY      There were no vitals filed for this visit.   Subjective Assessment - 08/21/20 1525     Subjective Patient reports no falls, a few trips from not paying attention to where she is going. Went walking prior to PT session.    Pertinent History Pt confirms the following hx is accurate (initially from acute care PT eval): "the pt is a 70 yo female presenting 3/17 with confusion and difficulty word-finding. Imaging revealed L MCA infarcts with nearly occlusive thrombus in proximal M2 and M3." PMH includes the following: CVA in Jan 2022 (for further details see ACUTE PT eval in EMR from 02/29/2020), anxiety, hypothyroidism, depression, OA, osteoporosis, memory change, migraine, seizures, hearing loss.    Limitations Standing;Walking;Lifting;House hold activities;Writing   she says she limits how heavy she lifts due to hesitation   How long can you sit comfortably? not limited    How long can you stand comfortably? Pt reports she doesn't  like to stand still for very long    How long can you walk comfortably? Difficulty with balance when walking over uneven ground, but states she is able to walk a mile. She says sometimes she gets tired walking and will sit down. She feels overall she's been fairly energetic.    Diagnostic tests per chart: "[place] 04/19/20: MR BRAIN IMPRESSION:  1. Patchy small volume acute ischemic nonhemorrhagic posterior left  MCA distribution infarcts, likely reflecting an embolic shower. No  associated hemorrhage or mass effect.  2. No other acute intracranial abnormality.  3. Underlying mild chronic microvascular ischemic  disease.; 3/17 CT ANGIO HEAD NECK:  IMPRESSION:  Nearly occlusive thrombus at the bifurcation of a proximal M2 MCA  branch approximately 6 mm from the MCA bifurcation. Subsequent  occlusion of a proximal left M3 MCA branch. No stenosis in the neck."    Patient Stated Goals Pt says she wants to improve her balance and does not want to fall again    Currently in Pain? No/denies               Patient reports no falls, a few trips from not paying attention to where she is going. Went walking prior to PT session.   BP mid session: seated: 109/68 standing 110/71   Treatment:    Neuro Re-ed: close CGA for all of the following Standing with CGA next to support surface:  Airex pad: yellow dynadisc modified tandem stance 2x 30 seconds each LE placement Airex pad under feet: sit to stand 10x  ambulate with rainbow ball toss 2x 86 ft; close CGA narrow BOS noted, cued for width  ambulate with rainbow ball bounce 2x 86 ft; multiple near LOB.   Seated: Dynadisc clockwise cirlces 10x each LE   TherEx: cues for body mechanics and sequencing.  backwards lunges 10x each LE, BUE support   Seated: Rainbow ball adduction squeeze 10x 5 second holds  dynadisc hamstring isometric 10x 3 second holds each LE   RTB alternating toe taps 2x 30 second sets for coordination, timing of muscl recruitment, and sequencing.  RTB hamstring curl 15x each LE Sit to stand pressing rainbow ball to sky 10x   Patient's right foot excessively internally rotates with prolonged ambulation. She is fatigued throughout session reporting fatigue at 8-9/10 resulting in limitation of interventions performed throughout session. Blood pressure monitored and although it is low it is within therapeutic range for therapy. Pt will benefit from further skilled PT to improve strength, balance and gait in order to increase ease and safety with all functional mobility               PT Education - 08/21/20 1525      Education Details exercise technique, body mechanics    Person(s) Educated Patient    Methods Explanation;Demonstration;Tactile cues;Verbal cues    Comprehension Verbalized understanding;Returned demonstration;Verbal cues required;Tactile cues required              PT Short Term Goals - 08/08/20 1704       PT SHORT TERM GOAL #1   Title Pt will be independent with HEP in order to improve strength and balance in order to decrease fall risk and improve function at home and work.    Baseline 4/18: to be initiated 7/6: HEP compliant, new HEP given    Time 6    Period Weeks    Status Partially Met    Target Date 07/02/20  PT Long Term Goals - 08/08/20 1705       PT LONG TERM GOAL #1   Title Pt will improve DGI by at least 3 points in order to demonstrate clinically significant improvement in balance and decreased risk for falls.    Baseline 4/18: 18/24    Time 12    Period Weeks    Status New    Target Date 08/13/20      PT LONG TERM GOAL #2   Title Pt will improve ABC scale by at least 13% in order to demonstrate clinically significant improvement in balance confidence.    Baseline 4/18: 63.75% 7/6: 74%    Time 12    Period Weeks    Status On-going    Target Date 08/13/20      PT LONG TERM GOAL #3   Title Pt will demonstrate improved ability to maintain balance on compliant surface with EC for at least 5 seconds to indicate improved ability to navigate uneven surfaces and ambulate in dimly lit rooms.    Baseline 4/18: Pt unable to maintain balance with EC on foam on CTSIB-M 7/6: met    Time 12    Period Weeks    Status Achieved      PT LONG TERM GOAL #4   Title Patient will increase BLE gross strength to 4+/5 as to improve functional strength for independent gait, increased standing tolerance and increased ADL ability.    Baseline 4/18: gross BLE strength currnelty 4/5 7/6: hips 4+/5 ankles 4/5    Time 12    Period Weeks    Status On-going     Target Date 08/13/20      PT LONG TERM GOAL #5   Title Patient will increase FOTO score to equal to or greater than 80 to demonstrate improvement in mobility and quality of life.    Baseline 4/18: 74% 7/6: 59%    Time 12    Period Weeks    Status On-going    Target Date 08/13/20      PT LONG TERM GOAL #6   Title Patient will reduce dizziness handicap inventory score to <10, for less dizziness with ADLs and increased safety with home and work tasks.    Baseline 4/27: DHI score is 16, indicating mild handicap 7/6: 22%    Time 11    Period Weeks    Status On-going    Target Date 08/13/20                   Plan - 08/21/20 1546     Clinical Impression Statement Patient's right foot excessively internally rotates with prolonged ambulation. She is fatigued throughout session reporting fatigue at 8-9/10 resulting in limitation of interventions performed throughout session. Blood pressure monitored and although it is low it is within therapeutic range for therapy. Pt will benefit from further skilled PT to improve strength, balance and gait in order to increase ease and safety with all functional mobility    Personal Factors and Comorbidities Age;Comorbidity 1;Comorbidity 2;Past/Current Experience;Sex;Social Background;Behavior Pattern;Comorbidity 3+    Comorbidities CVA in Jan 2022, anxiety, hypothyroidism, depression, OA, osteoporosis, memory change, migraine, seizures, hearing loss.    Examination-Activity Limitations Bathing;Stairs;Stand;Hygiene/Grooming;Locomotion Level;Carry;Bed Mobility;Other;Reach Overhead;Lift    Examination-Participation Restrictions Community Activity;Yard Work;Shop;Cleaning;Laundry;Medication Management;Meal Prep    Stability/Clinical Decision Making Evolving/Moderate complexity    Rehab Potential Good    PT Frequency 2x / week    PT Duration 12 weeks    PT Treatment/Interventions ADLs/Self Care Home Management;Biofeedback;Canalith  Repostioning;Cryotherapy;Electrical Stimulation;Aquatic Therapy;Moist Heat;Ultrasound;DME Instruction;Gait training;Stair training;Functional mobility training;Therapeutic activities;Therapeutic exercise;Balance training;Neuromuscular re-education;Cognitive remediation;Patient/family education;Orthotic Fit/Training;Manual techniques;Passive range of motion;Energy conservation;Taping;Splinting;Vestibular;Visual/perceptual remediation/compensation;Joint Manipulations    PT Next Visit Plan DGI    PT Home Exercise Plan Access Code: [email or web address], added:  Access Code: [email or web address]    Consulted and Agree with Plan of Care Patient             Patient will benefit from skilled therapeutic intervention in order to improve the following deficits and impairments:  Abnormal gait, Decreased balance, Decreased endurance, Difficulty walking, Decreased range of motion, Dizziness, Improper body mechanics, Impaired vision/preception, Decreased coordination, Decreased strength, Decreased mobility, Obesity, Decreased activity tolerance, Decreased knowledge of use of DME, Impaired flexibility, Postural dysfunction  Visit Diagnosis: Unsteadiness on feet  Muscle weakness (generalized)  Other abnormalities of gait and mobility     Problem List Patient Active Problem List   Diagnosis Date Noted   Thickened endometrium 08/13/2020   Acute ITP ([place]) 05/02/2020   Other intra-abdominal and pelvic swelling, mass and lump 04/26/2020   Pelvic mass in female 04/25/2020   Ovarian cancer screening 04/25/2020   Ovarian mass, left 04/25/2020   Stroke (cerebrum) ([place]) 04/20/2020   Acute CVA (cerebrovascular accident) ([place]) 04/19/2020   CVA (cerebral vascular accident) ([place]) 04/19/2020   History of ischemic multifocal posterior circulation stroke 02/29/2020   Thrombocytopenia ([place]) 02/28/2020   HPV test positive 07/08/2015   Difficulty hearing 06/28/2015   Arthritis, degenerative 06/28/2015   Obesity, Class I, BMI 30-34.9  06/28/2015   Paresthesia 06/28/2015   Purpura, nonthrombopenic ([place]) 06/28/2015   Other specified hypothyroidism 08/09/2014   Migraine without aura and without status migrainosus, not intractable 09/19/2008   Moderate major depression ([place]) 09/07/2006   OP (osteoporosis) 09/07/2006   Janna Arch, PT, DPT  08/21/2020, 4:05 PM  [place] MAIN [facility] SERVICES [address] [place], [place], [address] Phone: [phone]   Fax:  [phone]  Name: Natalie [name] MRN: [id number] Date of Birth: 04/11/1950

## 2020-08-23 ENCOUNTER — Other Ambulatory Visit: Payer: Self-pay

## 2020-08-23 ENCOUNTER — Ambulatory Visit: Payer: Medicare Other

## 2020-08-23 DIAGNOSIS — M6281 Muscle weakness (generalized): Secondary | ICD-10-CM | POA: Diagnosis not present

## 2020-08-23 DIAGNOSIS — R2681 Unsteadiness on feet: Secondary | ICD-10-CM | POA: Diagnosis not present

## 2020-08-23 DIAGNOSIS — R278 Other lack of coordination: Secondary | ICD-10-CM | POA: Diagnosis not present

## 2020-08-23 DIAGNOSIS — R2689 Other abnormalities of gait and mobility: Secondary | ICD-10-CM

## 2020-08-23 DIAGNOSIS — I63412 Cerebral infarction due to embolism of left middle cerebral artery: Secondary | ICD-10-CM | POA: Diagnosis not present

## 2020-08-23 NOTE — Addendum Note (Signed)
Addended by: Ramonita Lab on: 08/23/2020 04:57 PM   Modules accepted: Orders

## 2020-08-23 NOTE — Therapy (Signed)
[place] MAIN [facility] SERVICES [address] [place], [place], [address] Phone: [phone]   Fax:  [phone]  Physical Therapy Treatment  Patient Details  Name: Natalie [name] MRN: [id number] Date of Birth: Apr 08, 1950 Referring Provider (PT): [name], MD   Encounter Date: 08/23/2020   PT End of Session - 08/23/20 1708     Visit Number 12    Number of Visits 35    Date for PT Re-Evaluation 11/08/20    Authorization Type Traditional Medicare with [employer] Supplemental: VL based on certification    Authorization Time Period 05/21/20-08/13/20    PT Start Time 1516    PT Stop Time 1604    PT Time Calculation (min) 48 min    Equipment Utilized During Treatment Gait belt    Activity Tolerance Patient tolerated treatment well    Behavior During Therapy [facility] for tasks assessed/performed             Past Medical History:  Diagnosis Date   Acute bronchospasm due to viral infection    Anemia    history of   CVA (cerebral vascular accident) ([place]) 02/28/2020   Depressive disorder    Epistaxis    Fatigue    Hearing loss    Hypothyroidism    Memory change    Migraine    Numbness and tingling    Obesity (BMI 30.0-34.9)    Osteoarthritis    Osteoporosis    Polyuria    Premature menopause    Seizures ([place])    history of mini seizures, possible migraine induced    Past Surgical History:  Procedure Laterality Date   ABLATION  2006   BUBBLE STUDY  04/23/2020   Procedure: BUBBLE STUDY;  Surgeon: Larey Dresser, MD;  Location: [place];  Service: Cardiovascular;;   CATARACT EXTRACTION W/ INTRAOCULAR LENS IMPLANT Bilateral    COLONOSCOPY     COLONOSCOPY WITH PROPOFOL N/A 12/22/2018   Procedure: COLONOSCOPY WITH PROPOFOL;  Surgeon: Virgel Manifold, MD;  Location: [facility] ENDOSCOPY;  Service: Endoscopy;  Laterality: N/A;   EYE SURGERY  Spring 2018   Cataracts both eyes   LOOP RECORDER INSERTION N/A 04/23/2020   Procedure:  LOOP RECORDER INSERTION;  Surgeon: Evans Lance, MD;  Location: [place] CV LAB;  Service: Cardiovascular;  Laterality: N/A;   TEE WITHOUT CARDIOVERSION N/A 04/23/2020   Procedure: TRANSESOPHAGEAL ECHOCARDIOGRAM (TEE);  Surgeon: Larey Dresser, MD;  Location: [facility] ENDOSCOPY;  Service: Cardiovascular;  Laterality: N/A;   TONSILLECTOMY      There were no vitals filed for this visit.   Subjective Assessment - 08/23/20 1516     Subjective Patient reports she is feeling OK today. She reports she has been trying to eat better. She says, "I don't feel hungry." Pt has moved times she takes medications. She reports she has eaten today. Pt reports no other falls. She says she has a biopsy on Monday. Pt reports no pain today but "just a feeling" in her lower abdomen.    Pertinent History Pt confirms the following hx is accurate (initially from acute care PT eval): "the pt is a 70 yo female presenting 3/17 with confusion and difficulty word-finding. Imaging revealed L MCA infarcts with nearly occlusive thrombus in proximal M2 and M3." PMH includes the following: CVA in Jan 2022 (for further details see ACUTE PT eval in EMR from 02/29/2020), anxiety, hypothyroidism, depression, OA, osteoporosis, memory change, migraine, seizures, hearing loss.    Limitations Standing;Walking;Lifting;House hold activities;Writing  she says she limits how heavy she lifts due to hesitation   How long can you sit comfortably? not limited    How long can you stand comfortably? Pt reports she doesn't like to stand still for very long    How long can you walk comfortably? Difficulty with balance when walking over uneven ground, but states she is able to walk a mile. She says sometimes she gets tired walking and will sit down. She feels overall she's been fairly energetic.    Diagnostic tests per chart: "[place] 04/19/20: MR BRAIN IMPRESSION:  1. Patchy small volume acute ischemic nonhemorrhagic posterior left  MCA distribution  infarcts, likely reflecting an embolic shower. No  associated hemorrhage or mass effect.  2. No other acute intracranial abnormality.  3. Underlying mild chronic microvascular ischemic disease.; 3/17 CT ANGIO HEAD NECK:  IMPRESSION:  Nearly occlusive thrombus at the bifurcation of a proximal M2 MCA  branch approximately 6 mm from the MCA bifurcation. Subsequent  occlusion of a proximal left M3 MCA branch. No stenosis in the neck."    Patient Stated Goals Pt says she wants to improve her balance and does not want to fall again    Currently in Pain? No/denies            TREATMENT  FOTO: 65  At support surface Airex pad NBOS- 2x60 sec Airex pad WBOS, with vertical head-turns - x multiple reps Airex pad, WBOS, with horizontal head-turns -  x multiple reps Airex pad, NBOS, EC - 2x60 sec Toe taps onto the green cone - 20x each LE. Seated rest break   Airex pad: yellow dynadisc modified tandem stance 2x60 sec each LE Seated rest break Airex pad under feet: sit to stand 2x10; no LOB  Seated rest break between sets SLB - x multiple minutes per LE, *able to maintain approx 30 sec RLE Seated rest break Tandem stance - 2x30 sec BLEs   Pt requires CGA-min a throughout and intermittent UE support to maintain  balance for majority of exercises unless otherwise noted.    PT Education - 08/23/20 1708     Education Details exercise technique, body mechanics    Person(s) Educated Patient    Methods Explanation;Demonstration;Tactile cues;Verbal cues    Comprehension Verbalized understanding;Returned demonstration;Need further instruction              PT Short Term Goals - 08/23/20 1651       PT SHORT TERM GOAL #1   Title Pt will be independent with HEP in order to improve strength and balance in order to decrease fall risk and improve function at home and work.    Baseline 4/18: to be initiated 7/6: HEP compliant, new HEP given    Time 6    Period Weeks    Status Partially Met     Target Date 10/04/20               PT Long Term Goals - 08/23/20 1709       PT LONG TERM GOAL #1   Title Pt will improve DGI by at least 3 points in order to demonstrate clinically significant improvement in balance and decreased risk for falls.    Baseline 4/18: 18/24; 7/12: unstable BP, defer to next visit    Time 12    Period Weeks    Status New    Target Date 11/08/20      PT LONG TERM GOAL #2   Title Pt will improve ABC scale by  at least 13% in order to demonstrate clinically significant improvement in balance confidence.    Baseline 4/18: 63.75% 7/6: 74%    Time 12    Period Weeks    Status On-going    Target Date 11/08/20      PT LONG TERM GOAL #3   Title Pt will demonstrate improved ability to maintain balance on compliant surface with EC for at least 5 seconds to indicate improved ability to navigate uneven surfaces and ambulate in dimly lit rooms.    Baseline 4/18: Pt unable to maintain balance with EC on foam on CTSIB-M 7/6: met    Time 12    Period Weeks    Status Achieved      PT LONG TERM GOAL #4   Title Patient will increase BLE gross strength to 4+/5 as to improve functional strength for independent gait, increased standing tolerance and increased ADL ability.    Baseline 4/18: gross BLE strength currnelty 4/5 7/6: hips 4+/5 ankles 4/5    Time 12    Period Weeks    Status On-going    Target Date 11/08/20      PT LONG TERM GOAL #5   Title Patient will increase FOTO score to equal to or greater than 80 to demonstrate improvement in mobility and quality of life.    Baseline 4/18: 74% 7/6: 59%; 7/21: 65    Time 12    Period Weeks    Status On-going    Target Date 11/08/20      PT LONG TERM GOAL #6   Title Patient will reduce dizziness handicap inventory score to <10, for less dizziness with ADLs and increased safety with home and work tasks.    Baseline 4/27: DHI score is 16, indicating mild handicap 7/6: 22%    Time 11    Period Weeks    Status  On-going    Target Date 11/08/20                   Plan - 08/23/20 1712     Clinical Impression Statement Pt shows progress today with improved SLB and reporting overall perceived improvement in her balance since previous sessions. Although pt demonstrates progress, she is still most challenged with EC on compliant surface. She requires CGA-min a for majority of exercises and uses intermittent UE support to maintain/regain balance. Pt also saw slight improvement in FOTO score from previous assessment, indicating improved perception of functional mobility and QOL. The pt will continue to benefit from further skilled PT to improve strength, gait and balance to increase safety with all functional mobility.    Personal Factors and Comorbidities Age;Comorbidity 1;Comorbidity 2;Past/Current Experience;Sex;Social Background;Behavior Pattern;Comorbidity 3+    Comorbidities CVA in Jan 2022, anxiety, hypothyroidism, depression, OA, osteoporosis, memory change, migraine, seizures, hearing loss.    Examination-Activity Limitations Bathing;Stairs;Stand;Hygiene/Grooming;Locomotion Level;Carry;Bed Mobility;Other;Reach Overhead;Lift    Examination-Participation Restrictions Community Activity;Yard Work;Shop;Cleaning;Laundry;Medication Management;Meal Prep    Stability/Clinical Decision Making Evolving/Moderate complexity    Rehab Potential Good    PT Frequency 2x / week    PT Duration 12 weeks    PT Treatment/Interventions ADLs/Self Care Home Management;Biofeedback;Canalith Repostioning;Cryotherapy;Electrical Stimulation;Aquatic Therapy;Moist Heat;Ultrasound;DME Instruction;Gait training;Stair training;Functional mobility training;Therapeutic activities;Therapeutic exercise;Balance training;Neuromuscular re-education;Cognitive remediation;Patient/family education;Orthotic Fit/Training;Manual techniques;Passive range of motion;Energy conservation;Taping;Splinting;Vestibular;Visual/perceptual  remediation/compensation;Joint Manipulations    PT Next Visit Plan dynamic balance tasks with head turns    PT Home Exercise Plan Access Code: [email or web address], added:  Access Code: [email or web address]    Consulted and Agree with Plan of Care Patient  Patient will benefit from skilled therapeutic intervention in order to improve the following deficits and impairments:  Abnormal gait, Decreased balance, Decreased endurance, Difficulty walking, Decreased range of motion, Dizziness, Improper body mechanics, Impaired vision/preception, Decreased coordination, Decreased strength, Decreased mobility, Obesity, Decreased activity tolerance, Decreased knowledge of use of DME, Impaired flexibility, Postural dysfunction  Visit Diagnosis: Unsteadiness on feet  Other abnormalities of gait and mobility  Other lack of coordination  Muscle weakness (generalized)     Problem List Patient Active Problem List   Diagnosis Date Noted   Thickened endometrium 08/13/2020   Acute ITP ([place]) 05/02/2020   Other intra-abdominal and pelvic swelling, mass and lump 04/26/2020   Pelvic mass in female 04/25/2020   Ovarian cancer screening 04/25/2020   Ovarian mass, left 04/25/2020   Stroke (cerebrum) ([place]) 04/20/2020   Acute CVA (cerebrovascular accident) ([place]) 04/19/2020   CVA (cerebral vascular accident) ([place]) 04/19/2020   History of ischemic multifocal posterior circulation stroke 02/29/2020   Thrombocytopenia ([place]) 02/28/2020   HPV test positive 07/08/2015   Difficulty hearing 06/28/2015   Arthritis, degenerative 06/28/2015   Obesity, Class I, BMI 30-34.9 06/28/2015   Paresthesia 06/28/2015   Purpura, nonthrombopenic ([place]) 06/28/2015   Other specified hypothyroidism 08/09/2014   Migraine without aura and without status migrainosus, not intractable 09/19/2008   Moderate major depression ([place]) 09/07/2006   OP (osteoporosis) 09/07/2006   Ricard Dillon PT, DPT 08/23/2020, 5:16 PM  [place] MAIN [facility] SERVICES [address] [place], [place], [address] Phone: [phone]   Fax:  [phone]  Name: Natalie [name] MRN: [id number] Date of Birth: 08-Apr-1950

## 2020-08-27 ENCOUNTER — Inpatient Hospital Stay: Payer: Medicare Other

## 2020-08-27 ENCOUNTER — Inpatient Hospital Stay (HOSPITAL_BASED_OUTPATIENT_CLINIC_OR_DEPARTMENT_OTHER): Payer: Medicare Other | Admitting: Gynecologic Oncology

## 2020-08-27 ENCOUNTER — Encounter: Payer: Self-pay | Admitting: Gynecologic Oncology

## 2020-08-27 ENCOUNTER — Other Ambulatory Visit: Payer: Self-pay

## 2020-08-27 VITALS — BP 126/88 | HR 79 | Temp 97.9°F | Resp 18 | Ht 66.0 in | Wt 186.3 lb

## 2020-08-27 DIAGNOSIS — N838 Other noninflammatory disorders of ovary, fallopian tube and broad ligament: Secondary | ICD-10-CM

## 2020-08-27 DIAGNOSIS — N882 Stricture and stenosis of cervix uteri: Secondary | ICD-10-CM | POA: Diagnosis not present

## 2020-08-27 DIAGNOSIS — R971 Elevated cancer antigen 125 [CA 125]: Secondary | ICD-10-CM

## 2020-08-27 DIAGNOSIS — Z6830 Body mass index (BMI) 30.0-30.9, adult: Secondary | ICD-10-CM | POA: Diagnosis not present

## 2020-08-27 DIAGNOSIS — Z8673 Personal history of transient ischemic attack (TIA), and cerebral infarction without residual deficits: Secondary | ICD-10-CM | POA: Diagnosis not present

## 2020-08-27 DIAGNOSIS — R9389 Abnormal findings on diagnostic imaging of other specified body structures: Secondary | ICD-10-CM

## 2020-08-27 DIAGNOSIS — E039 Hypothyroidism, unspecified: Secondary | ICD-10-CM | POA: Diagnosis not present

## 2020-08-27 DIAGNOSIS — F32A Depression, unspecified: Secondary | ICD-10-CM | POA: Diagnosis not present

## 2020-08-27 DIAGNOSIS — Z87891 Personal history of nicotine dependence: Secondary | ICD-10-CM | POA: Diagnosis not present

## 2020-08-27 DIAGNOSIS — M810 Age-related osteoporosis without current pathological fracture: Secondary | ICD-10-CM | POA: Diagnosis not present

## 2020-08-27 DIAGNOSIS — D696 Thrombocytopenia, unspecified: Secondary | ICD-10-CM | POA: Diagnosis present

## 2020-08-27 DIAGNOSIS — Z79899 Other long term (current) drug therapy: Secondary | ICD-10-CM | POA: Diagnosis not present

## 2020-08-27 DIAGNOSIS — Z7901 Long term (current) use of anticoagulants: Secondary | ICD-10-CM | POA: Diagnosis not present

## 2020-08-27 DIAGNOSIS — D398 Neoplasm of uncertain behavior of other specified female genital organs: Secondary | ICD-10-CM

## 2020-08-27 DIAGNOSIS — E669 Obesity, unspecified: Secondary | ICD-10-CM | POA: Diagnosis not present

## 2020-08-27 NOTE — Progress Notes (Signed)
[facility] Summary Report / Loop Recorder 

## 2020-08-27 NOTE — Patient Instructions (Signed)
I will call you once I get your biopsy results and your blood test from today back.  We will discuss next steps based on these results.  I would expect you will have some bleeding of what looks like older blood over the next couple of days after the procedure.

## 2020-08-27 NOTE — Progress Notes (Signed)
Gynecologic Oncology Return Clinic Visit  08/27/20  Reason for Visit: Endometrial biopsy  Treatment History: [name] recent history is significant for a stroke in 02/2020 after presenting with a month of intermittent and progressive diplopia.  MRI at that time showed bilateral posterior ischemic changes and the patient was treated with Plavix and aspirin for an acute ischemic stroke.  She had recurrence of symptoms in mid March and then presented with difficulty speaking and mentating.  She was diagnosed with an acute left MCA infarct and nearly occlusive thrombus.  She was continued on aspirin and Plavix.  Work-up during her most recent hospitalization included negative Dopplers of her lower extremities and a echo showing moderate left atrial enlargement.  She had a loop recorder placed as well.  During that hospitalization, she was noted to have thrombocytopenia which is presumed to be secondary to ITP and the patient was started on steroids.   During more recent hospitalization for stroke, patient underwent CT of the abdomen and pelvis showing an adnexal mass.  Pelvic ultrasound was recommended for follow-up.  Patient had a CA-125 that was elevated at almost 600.     CA-125 05/02/20: 592   05/11/20: pap negative  Interval History: The patient presents today for endometrial biopsy.  She denies any significant changes in how she is been feeling since our phone visit recently.  She denies any vaginal bleeding.  She continues to have intermittent aching in her abdomen.  She reports regular bowel and bladder function.  Past Medical/Surgical History: Past Medical History:  Diagnosis Date   Acute bronchospasm due to viral infection    Anemia    history of   CVA (cerebral vascular accident) ([place]) 02/28/2020   Depressive disorder    Epistaxis    Fatigue    Hearing loss    Hypothyroidism    Memory change    Migraine    Numbness and tingling    Obesity (BMI 30.0-34.9)    Osteoarthritis     Osteoporosis    Polyuria    Premature menopause    Seizures ([place])    history of mini seizures, possible migraine induced    Past Surgical History:  Procedure Laterality Date   ABLATION  2006   BUBBLE STUDY  04/23/2020   Procedure: BUBBLE STUDY;  Surgeon: Larey Dresser, MD;  Location: [facility];  Service: Cardiovascular;;   CATARACT EXTRACTION W/ INTRAOCULAR LENS IMPLANT Bilateral    COLONOSCOPY     COLONOSCOPY WITH PROPOFOL N/A 12/22/2018   Procedure: COLONOSCOPY WITH PROPOFOL;  Surgeon: Virgel Manifold, MD;  Location: [facility] ENDOSCOPY;  Service: Endoscopy;  Laterality: N/A;   EYE SURGERY  Spring 2018   Cataracts both eyes   LOOP RECORDER INSERTION N/A 04/23/2020   Procedure: LOOP RECORDER INSERTION;  Surgeon: Evans Lance, MD;  Location: [facility];  Service: Cardiovascular;  Laterality: N/A;   TEE WITHOUT CARDIOVERSION N/A 04/23/2020   Procedure: TRANSESOPHAGEAL ECHOCARDIOGRAM (TEE);  Surgeon: Larey Dresser, MD;  Location: [facility] ENDOSCOPY;  Service: Cardiovascular;  Laterality: N/A;   TONSILLECTOMY      Family History  Problem Relation Age of Onset   Early death Father        suicide   Depression Father    Diabetes Father    Cancer Father        prostate   Hearing loss Father        due to war   Alzheimer's disease Mother    Heart disease Brother  Atrial fibrillation Brother    Heart disease Maternal Aunt    Dementia Maternal Aunt    Heart disease Maternal Uncle    Dementia Maternal Grandmother    Heart disease Brother    Atrial fibrillation Brother    Colon cancer Neg Hx    Breast cancer Neg Hx    Ovarian cancer Neg Hx    Pancreatic cancer Neg Hx    Endometrial cancer Neg Hx     Social History   Socioeconomic History   Marital status: Single    Spouse name: Not on file   Number of children: 1   Years of education: Not on file   Highest education level: Bachelor's degree (e.g., BA, AB, BS)  Occupational History   Occupation: retired    Tobacco Use   Smoking status: Former    Packs/day: 0.50    Years: 6.00    Pack years: 3.00    Types: Cigarettes    Quit date: 02/11/1974    Years since quitting: 46.5   Smokeless tobacco: Never  Vaping Use   Vaping Use: Never used  Substance and Sexual Activity   Alcohol use: Yes    Alcohol/week: 0.0 standard drinks    Comment: 2-3 drinks monthly   Drug use: Never   Sexual activity: Not Currently    Comment: Don't use not sexually active  Other Topics Concern   Not on file  Social History Narrative   Raised an adopted child on her own   Working part time reviewed documents   Social Determinants of Radio broadcast assistant Strain: Low Risk    Difficulty of Paying Living Expenses: Not hard at all  Food Insecurity: No Food Insecurity   Worried About Charity fundraiser in the Last Year: Never true   Arboriculturist in the Last Year: Never true  Transportation Needs: No Transportation Needs   Lack of Transportation (Medical): No   Lack of Transportation (Non-Medical): No  Physical Activity: Inactive   Days of Exercise per Week: 0 days   Minutes of Exercise per Session: 0 min  Stress: No Stress Concern Present   Feeling of Stress : Not at all  Social Connections: Moderately Integrated   Frequency of Communication with Friends and Family: More than three times a week   Frequency of Social Gatherings with Friends and Family: Twice a week   Attends Religious Services: More than 4 times per year   Active Member of [employer] or Organizations: Yes   Attends Music therapist: More than 4 times per year   Marital Status: Never married    Current Medications:  Current Outpatient Medications:    atorvastatin (LIPITOR) 40 MG tablet, Take 1 tablet (40 mg total) by mouth daily., Disp: 90 tablet, Rfl: 1   buPROPion (WELLBUTRIN SR) 150 MG 12 hr tablet, Take 150 mg by mouth 2 (two) times daily., Disp: , Rfl:    citalopram (CELEXA) 40 MG tablet, Take 40 mg by mouth daily.,  Disp: , Rfl:    clindamycin-benzoyl peroxide (BENZACLIN) gel, Apply topically 2 (two) times daily. (Patient taking differently: Apply 1 application topically 2 (two) times daily as needed (facial acne).), Disp: 50 g, Rfl: 0   clopidogrel (PLAVIX) 75 MG tablet, Take 1 tablet (75 mg total) by mouth daily., Disp: 30 tablet, Rfl: 1   Cyanocobalamin (VITAMIN B-12) 1000 MCG SUBL, Place 1,000 mcg under the tongue at bedtime., Disp: , Rfl:    levothyroxine (SYNTHROID) 75 MCG tablet,  Take 1 tablet (75 mcg total) by mouth daily before breakfast., Disp: 90 tablet, Rfl: 1   Multiple Vitamin (MULTIVITAMIN WITH MINERALS) TABS tablet, Take 1 tablet by mouth daily., Disp: , Rfl:    polyvinyl alcohol (LIQUIFILM TEARS) 1.4 % ophthalmic solution, Place 1 drop into both eyes daily as needed for dry eyes., Disp: , Rfl:    rivaroxaban (XARELTO) 20 MG TABS tablet, Take 20 mg by mouth daily with supper., Disp: , Rfl:    topiramate (TOPAMAX) 25 MG tablet, Take 25 mg by mouth daily. , Disp: , Rfl:    Vitamin D, Ergocalciferol, (DRISDOL) 1.25 MG (50000 UNIT) CAPS capsule, TAKE 1 CAPSULE BY MOUTH EVERY 7 DAYS (Patient taking differently: Take 50,000 Units by mouth every Tuesday.), Disp: 12 capsule, Rfl: 1  Review of Systems: Denies appetite changes, fevers, chills, fatigue, unexplained weight changes. Denies hearing loss, neck lumps or masses, mouth sores, ringing in ears or voice changes. Denies cough or wheezing.  Denies shortness of breath. Denies chest pain or palpitations. Denies leg swelling. Denies abdominal distention, pain, blood in stools, constipation, diarrhea, nausea, vomiting, or early satiety. Denies pain with intercourse, dysuria, frequency, hematuria or incontinence. Denies hot flashes, pelvic pain, vaginal bleeding or vaginal discharge.   Denies joint pain, back pain or muscle pain/cramps. Denies itching, rash, or wounds. Denies dizziness, headaches, numbness or seizures. Denies swollen lymph nodes or  glands, denies easy bruising or bleeding. Denies anxiety, depression, confusion, or decreased concentration.  Physical Exam: BP 126/88 (BP Location: Left Arm, Patient Position: Sitting)   Pulse 79   Temp 97.9 F (36.6 C) (Tympanic)   Resp 18   Ht $R'5\' 6"'MZ$  (1.676 m)   Wt 186 lb 4.8 oz (84.5 kg)   SpO2 98%   BMI 30.07 kg/m  General: Alert, oriented, no acute distress. HEENT: Posterior oropharynx clear, sclera anicteric. Chest: Unlabored breathing on room air. Abdomen: soft, nontender.  Normoactive bowel sounds.  Fullness appreciated in the right lower abdomen, no hepatosplenomegaly appreciated.   Extremities: Grossly normal range of motion.  Warm, well perfused.  No edema bilaterally. Skin: No rashes or lesions noted. GU: Normal appearing external genitalia without erythema, excoriation, or lesions.  Speculum exam reveals mildly atrophic vaginal mucosa, cervix normal-appearing and atrophic.  Bimanual exam reveals 8-10 cm mobile uterus, fullness appreciated in the cul-de-sac, mobile, smooth.    Endometrial Biopsy Preoperative diagnosis: Thickened endometrial lining Postoperative diagnosis: Same as above Procedure: Endometrial biopsy Physician: Berline Lopes MD Specimens: Endometrial biopsy Estimated blood loss: Minimal Procedure: The procedure was explained in detail including the risks and benefits.  Patient gave verbal consent.  The speculum was placed and the cervix was prepped with Betadine x3.  Single-tooth tenaculum was placed on the anterior lip of the cervix.  Given cervical stenosis, and a Cytobrush was used to cannulate the external os.  Some resistance also met at the internal os. The endometrial biopsy was performed with 2 passes of the endometrial biopsy pipelle.  Difficult to tell, tissue was obtained secondary to significant amount of older appearing blood. The uterus sounded to just over 8 cm.  The patient tolerated procedure well.  Laboratory & Radiologic Studies: Pelvic  ultrasound on 08/10/20: IMPRESSION: Thickened heterogeneous endometrial complex with somewhat nodular margins, associated with a 3.8 cm diameter fluid collection within the endometrial canal likely blood products, cannot exclude cervical stenosis or endometrial malignancy; tissue diagnosis recommended.   8.2 cm diameter cyst of the LEFT ovary; consider GYN consult and followup US in 3-6 months, or  pelvis MRI w/o and w/ contrast for improved characterization. Note: This recommendation does not apply to premenarchal patients or to those with increased risk (genetic, family history, elevated tumor markers or other high-risk factors) of ovarian cancer. Reference: Radiology 2019 Nov; 293(2):359-371.   Assessment & Plan: Natalie [name] is a 70 y.o. woman with an enlarging simple appearing adnexal mass and new finding of thickened endometrium in the setting of recent CVA.  Given thickened and nodular appearing endometrium, I had recommended endometrial biopsy.  This was performed today.  Patient noted to have cervical stenosis, likely in the setting of her prior endometrial ablation.  I discussed the risk, given findings today, that we may not have sufficient tissue for diagnosis.  I will call her once I have the results of her biopsy back.  Overall, despite some increase in size of her adnexal mass, imaging features on ultrasound continue to be simple in appearance.  The patient has had some symptoms over the last month or so, possibly related to the increased size of her ovarian mass.  If her endometrial biopsy is diagnostic, we discussed utility of a pelvic MRI to better characterize her adnexa.  She had a CA-125 checked initially on presentation a number of months ago.  On repeat, this value had slightly decreased.  I suggested we repeat a value today to see if this has decreased outside of the setting of acute stroke.  36 minutes of total time was spent for this patient encounter, including  preparation, face-to-face counseling with the patient and coordination of care, and documentation of the encounter.  Jeral Pinch, MD  Division of Gynecologic Oncology  Department of Obstetrics and Gynecology  [facility]

## 2020-08-28 ENCOUNTER — Encounter: Payer: Self-pay | Admitting: Gynecologic Oncology

## 2020-08-28 ENCOUNTER — Telehealth: Payer: Self-pay | Admitting: Gynecologic Oncology

## 2020-08-28 ENCOUNTER — Ambulatory Visit: Payer: Medicare Other

## 2020-08-28 LAB — SURGICAL PATHOLOGY

## 2020-08-28 LAB — CA 125: Cancer Antigen (CA) 125: 731 U/mL — ABNORMAL HIGH (ref 0.0–38.1)

## 2020-08-28 NOTE — Telephone Encounter (Signed)
I called the message to review CA-125 that biopsy from recent clinic visit.  As I suspected, her biopsy shows mostly blood.  There is very scant benign endometrial tissue.  Her CA125 has increased some since her level in March.  Overall, the findings on ultrasound support a benign process of the ovary, but I am concerned without another explanation why her CA-125 is increasing.  Additionally, given the appearance of her endometrial lining on ultrasound with very scant tissue noted on biopsy, I think she needs further sampling.  I let her know that I will reach out to her primary care provider, neurologist, and cardiologist regarding her risk stratification for surgery.  Ideally, we would plan to do a dilation and curettage and send the curettings for frozen section at the time of surgery.  I would then proceed with robotic BSO with frozen section of the cystic mass on her ovary.  If no hyperplasia or malignancy seen in the uterus and no malignancy noted on her ovary, then no additional surgery would need to be performed.  If cancer identified, then I would proceed with total hysterectomy and any other indicated procedures.  If she is not deemed an appropriate candidate for such as surgery, then I recommend we do a D&C under very light sedation and local anesthesia in the outpatient surgical center and get an MRI to better assess her cystic adnexal mass.  I will let her know once I have heard from her various doctors regarding plan moving forward.  Valarie Cones MD

## 2020-08-29 ENCOUNTER — Other Ambulatory Visit: Payer: Self-pay | Admitting: Family Medicine

## 2020-08-29 NOTE — Telephone Encounter (Signed)
Requested medication (s) are due for refill today: Yes  Requested medication (s) are on the active medication list: Yes  Last refill:  08/09/2014  Future visit scheduled: No  Notes to clinic:  Prescriptions expired.    Requested Prescriptions  Pending Prescriptions Disp Refills   buPROPion (WELLBUTRIN SR) 150 MG 12 hr tablet [Pharmacy Med Name: BUPROPION SR '150MG'$  TABLETS (12 H)] 180 tablet     Sig: TAKE 2 TABLETS BY MOUTH EVERY DAY      Psychiatry: Antidepressants - bupropion Passed - 08/29/2020  2:44 PM      Passed - Completed PHQ-2 or PHQ-9 in the last 360 days      Passed - Last BP in normal range    BP Readings from Last 1 Encounters:  08/27/20 126/88          Passed - Valid encounter within last 6 months    Recent Outpatient Visits           1 week ago Recurrent cerebrovascular accidents (CVAs) [facility]   [facility], Drue Stager, MD   3 months ago Recurrent cerebrovascular accidents (CVAs) [facility])   [facility] [name], MD   5 months ago History of CVA in adulthood   [facility], Drue Stager, MD   7 months ago Double vision with both eyes open   [facility] [name], MD   9 months ago Adult hypothyroidism   [facility] [name], MD       Future Appointments             In 3 months Ancil Boozer, Drue Stager, MD [facility], [place]   In 3 months  [facility], [place]               citalopram (CELEXA) 40 MG tablet [Pharmacy Med Name: CITALOPRAM '40MG'$  TABLETS] 90 tablet     Sig: TAKE 1 TABLET BY [place]      Psychiatry:  Antidepressants - SSRI Passed - 08/29/2020  2:44 PM      Passed - Completed PHQ-2 or PHQ-9 in the last 360 days      Passed - Valid encounter within last 6 months    Recent Outpatient Visits           1 week ago Recurrent cerebrovascular accidents (CVAs) [facility])   [facility], Drue Stager, MD   3 months ago Recurrent cerebrovascular accidents (CVAs) [facility])   [facility] [name], MD   5 months ago History of CVA in adulthood   [facility], Drue Stager, MD   7 months ago Double vision with both eyes open   [facility] [name], MD   9 months ago Adult hypothyroidism   [facility] [name], MD       Future Appointments             In 3 months Ancil Boozer, Drue Stager, MD [facility], [place]   In 3 months  [facility], [facility]

## 2020-08-29 NOTE — Telephone Encounter (Signed)
Last seen 7.14.2022 upcoming 11.15.2022

## 2020-08-30 ENCOUNTER — Ambulatory Visit: Payer: Medicare Other

## 2020-09-02 ENCOUNTER — Encounter: Payer: Self-pay | Admitting: Gynecologic Oncology

## 2020-09-03 ENCOUNTER — Telehealth: Payer: Self-pay

## 2020-09-03 NOTE — Telephone Encounter (Signed)
Spoke with Natalie [name] earlier today regarding bleeding. The bleeding got heavier over the weekend. She is saturating 3 pads a day.  She states that it is difficult for her to tell if it is bright red blood or old brown blood. She feels tired and less energetic.  She is not dizzy upon standing. Told her that Dr.Tucker stated that the biopsy on 08-27-20 open the cervical canal causing old blood/drainage to come out. It may take several days for the bleeding to stop.  Offered for her to come in for a cbc tomorrow.  Natalie [name] said she will see if the bleeding slows down in the next day or two and if not she will call with an up date and to set up lab work.

## 2020-09-04 ENCOUNTER — Encounter: Payer: Self-pay | Admitting: Gynecologic Oncology

## 2020-09-04 ENCOUNTER — Encounter: Payer: Self-pay | Admitting: Family Medicine

## 2020-09-04 ENCOUNTER — Ambulatory Visit: Payer: Medicare Other

## 2020-09-06 ENCOUNTER — Encounter: Payer: Self-pay | Admitting: Gynecologic Oncology

## 2020-09-06 ENCOUNTER — Ambulatory Visit: Payer: Medicare Other

## 2020-09-07 ENCOUNTER — Telehealth: Payer: Self-pay

## 2020-09-07 ENCOUNTER — Other Ambulatory Visit: Payer: Self-pay

## 2020-09-07 ENCOUNTER — Inpatient Hospital Stay: Payer: Medicare Other | Attending: Hematology and Oncology

## 2020-09-07 DIAGNOSIS — N939 Abnormal uterine and vaginal bleeding, unspecified: Secondary | ICD-10-CM

## 2020-09-07 DIAGNOSIS — D696 Thrombocytopenia, unspecified: Secondary | ICD-10-CM | POA: Diagnosis not present

## 2020-09-07 DIAGNOSIS — R9389 Abnormal findings on diagnostic imaging of other specified body structures: Secondary | ICD-10-CM

## 2020-09-07 LAB — CBC (CANCER CENTER ONLY)
HCT: 38.1 % (ref 36.0–46.0)
Hemoglobin: 12.5 g/dL (ref 12.0–15.0)
MCH: 31.8 pg (ref 26.0–34.0)
MCHC: 32.8 g/dL (ref 30.0–36.0)
MCV: 96.9 fL (ref 80.0–100.0)
Platelet Count: 194 10*3/uL (ref 150–400)
RBC: 3.93 MIL/uL (ref 3.87–5.11)
RDW: 11.8 % (ref 11.5–15.5)
WBC Count: 5.7 10*3/uL (ref 4.0–10.5)
nRBC: 0 % (ref 0.0–0.2)

## 2020-09-07 NOTE — Telephone Encounter (Signed)
Received phone call from Natalie [name] requesting to make a lab appointment. She has been scheduled for today at 1pm. Patient is in agreement of date and time. She will call with any questions or concerns.

## 2020-09-07 NOTE — Telephone Encounter (Signed)
Told Natalie [name] that her Hgb was good at 12.5. Dr. Berline Lopes states that her counts are higher then a month a go. Pt states that the bleeding is bright red blood but declining.  The pads are changed ~three times a day. There is less blood on the pads the last day or so.

## 2020-09-09 MED ORDER — MEDROXYPROGESTERONE ACETATE 5 MG PO TABS: 5.0000 mg | ORAL_TABLET | Freq: Every day | ORAL | 0 refills | Status: DC

## 2020-09-11 ENCOUNTER — Ambulatory Visit: Payer: Medicare Other | Attending: Family Medicine

## 2020-09-11 ENCOUNTER — Other Ambulatory Visit: Payer: Self-pay

## 2020-09-11 DIAGNOSIS — I63412 Cerebral infarction due to embolism of left middle cerebral artery: Secondary | ICD-10-CM | POA: Diagnosis not present

## 2020-09-11 DIAGNOSIS — R278 Other lack of coordination: Secondary | ICD-10-CM | POA: Insufficient documentation

## 2020-09-11 DIAGNOSIS — R2689 Other abnormalities of gait and mobility: Secondary | ICD-10-CM | POA: Diagnosis not present

## 2020-09-11 DIAGNOSIS — R2681 Unsteadiness on feet: Secondary | ICD-10-CM | POA: Diagnosis not present

## 2020-09-11 DIAGNOSIS — M6281 Muscle weakness (generalized): Secondary | ICD-10-CM | POA: Diagnosis not present

## 2020-09-11 NOTE — Therapy (Signed)
[place] MAIN [facility] SERVICES [address] [place], [place], [address] Phone: [phone]   Fax:  [phone]  Physical Therapy Treatment  Patient Details  Name: Natalie [name] MRN: [id number] Date of Birth: 1950-07-04 Referring Provider (PT): [name], MD   Encounter Date: 09/11/2020   PT End of Session - 09/12/20 1157     Visit Number 13    Number of Visits 35    Date for PT Re-Evaluation 11/08/20    Authorization Type Traditional Medicare with [employer] Supplemental: VL based on certification    Authorization Time Period 05/21/20-08/13/20    PT Start Time 1505    PT Stop Time 1546    PT Time Calculation (min) 41 min    Equipment Utilized During Treatment Gait belt    Activity Tolerance Patient tolerated treatment well    Behavior During Therapy [facility] for tasks assessed/performed             Past Medical History:  Diagnosis Date   Acute bronchospasm due to viral infection    Anemia    history of   CVA (cerebral vascular accident) ([place]) 02/28/2020   Depressive disorder    Epistaxis    Fatigue    Hearing loss    Hypothyroidism    Memory change    Migraine    Numbness and tingling    Obesity (BMI 30.0-34.9)    Osteoarthritis    Osteoporosis    Polyuria    Premature menopause    Seizures ([place])    history of mini seizures, possible migraine induced    Past Surgical History:  Procedure Laterality Date   ABLATION  2006   BUBBLE STUDY  04/23/2020   Procedure: BUBBLE STUDY;  Surgeon: Larey Dresser, MD;  Location: [facility];  Service: Cardiovascular;;   CATARACT EXTRACTION W/ INTRAOCULAR LENS IMPLANT Bilateral    COLONOSCOPY     COLONOSCOPY WITH PROPOFOL N/A 12/22/2018   Procedure: COLONOSCOPY WITH PROPOFOL;  Surgeon: Virgel Manifold, MD;  Location: [facility] ENDOSCOPY;  Service: Endoscopy;  Laterality: N/A;   EYE SURGERY  Spring 2018   Cataracts both eyes   LOOP RECORDER INSERTION N/A 04/23/2020   Procedure:  LOOP RECORDER INSERTION;  Surgeon: Evans Lance, MD;  Location: [place] CV LAB;  Service: Cardiovascular;  Laterality: N/A;   TEE WITHOUT CARDIOVERSION N/A 04/23/2020   Procedure: TRANSESOPHAGEAL ECHOCARDIOGRAM (TEE);  Surgeon: Larey Dresser, MD;  Location: [facility] ENDOSCOPY;  Service: Cardiovascular;  Laterality: N/A;   TONSILLECTOMY      There were no vitals filed for this visit.   Subjective Assessment - 09/12/20 1153     Subjective Pt has been dealing with a lot recently d/t mass in abdomen where her doctors are testing for possible CA. This is why pt was unable to come to some of her appointments. She is still dealing with some bleeding from a recent procedure (her doctor is aware and they are following it). Pt thinks she will liekly be having surgery soon.    Pertinent History Pt confirms the following hx is accurate (initially from acute care PT eval): "the pt is a 70 yo female presenting 3/17 with confusion and difficulty word-finding. Imaging revealed L MCA infarcts with nearly occlusive thrombus in proximal M2 and M3." PMH includes the following: CVA in Jan 2022 (for further details see ACUTE PT eval in EMR from 02/29/2020), anxiety, hypothyroidism, depression, OA, osteoporosis, memory change, migraine, seizures, hearing loss.    Limitations Standing;Walking;Lifting;House hold  activities;Writing   she says she limits how heavy she lifts due to hesitation   How long can you sit comfortably? not limited    How long can you stand comfortably? Pt reports she doesn't like to stand still for very long    How long can you walk comfortably? Difficulty with balance when walking over uneven ground, but states she is able to walk a mile. She says sometimes she gets tired walking and will sit down. She feels overall she's been fairly energetic.    Diagnostic tests per chart: "[place] 04/19/20: MR BRAIN IMPRESSION:  1. Patchy small volume acute ischemic nonhemorrhagic posterior left  MCA distribution  infarcts, likely reflecting an embolic shower. No  associated hemorrhage or mass effect.  2. No other acute intracranial abnormality.  3. Underlying mild chronic microvascular ischemic disease.; 3/17 CT ANGIO HEAD NECK:  IMPRESSION:  Nearly occlusive thrombus at the bifurcation of a proximal M2 MCA  branch approximately 6 mm from the MCA bifurcation. Subsequent  occlusion of a proximal left M3 MCA branch. No stenosis in the neck."    Patient Stated Goals Pt says she wants to improve her balance and does not want to fall again    Currently in Pain? No/denies           TREATMENT  Neuro Re-Ed At support surface with CGA-min a throughout: Airex pad NBOS- 60 sec Airex pad WBOS - 60 sec Airex pad WBOS with vertical head-turns - x multiple reps each direction Airex pad, WBOS, with horizontal head-turns -  x multiple reps each direction Airex pad, NBOS, EC - 2x60 sec, requires up to min a d/t LOB. Seated rest breaks and standing rest breaks throughout  SLB 4x30-45 sec; intermittent UE support, CGA  Toe taps onto cones -  x several reps. Pt rates very difficult. Seated rest break   Stepping over hurdle forward/backward 15x each direction Seated rest break  Tandem stance - 2x30 sec BLEs   Pt requires CGA-min a throughout and intermittent UE support to maintain  balance for majority of exercises.  PT reinforces that pt only perform seated HEP. Pt verbalizes understanding.   PT ambulates with pt to lobby (CGA) where pt is to be picked up by her friend.     PT Short Term Goals - 08/23/20 1651       PT SHORT TERM GOAL #1   Title Pt will be independent with HEP in order to improve strength and balance in order to decrease fall risk and improve function at home and work.    Baseline 4/18: to be initiated 7/6: HEP compliant, new HEP given    Time 6    Period Weeks    Status Partially Met    Target Date 10/04/20               PT Long Term Goals - 08/23/20 1709       PT LONG  TERM GOAL #1   Title Pt will improve DGI by at least 3 points in order to demonstrate clinically significant improvement in balance and decreased risk for falls.    Baseline 4/18: 18/24; 7/12: unstable BP, defer to next visit    Time 12    Period Weeks    Status New    Target Date 11/08/20      PT LONG TERM GOAL #2   Title Pt will improve ABC scale by at least 13% in order to demonstrate clinically significant improvement in balance confidence.  Baseline 4/18: 63.75% 7/6: 74%    Time 12    Period Weeks    Status On-going    Target Date 11/08/20      PT LONG TERM GOAL #3   Title Pt will demonstrate improved ability to maintain balance on compliant surface with EC for at least 5 seconds to indicate improved ability to navigate uneven surfaces and ambulate in dimly lit rooms.    Baseline 4/18: Pt unable to maintain balance with EC on foam on CTSIB-M 7/6: met    Time 12    Period Weeks    Status Achieved      PT LONG TERM GOAL #4   Title Patient will increase BLE gross strength to 4+/5 as to improve functional strength for independent gait, increased standing tolerance and increased ADL ability.    Baseline 4/18: gross BLE strength currnelty 4/5 7/6: hips 4+/5 ankles 4/5    Time 12    Period Weeks    Status On-going    Target Date 11/08/20      PT LONG TERM GOAL #5   Title Patient will increase FOTO score to equal to or greater than 80 to demonstrate improvement in mobility and quality of life.    Baseline 4/18: 74% 7/6: 59%; 7/21: 65    Time 12    Period Weeks    Status On-going    Target Date 11/08/20      PT LONG TERM GOAL #6   Title Patient will reduce dizziness handicap inventory score to <10, for less dizziness with ADLs and increased safety with home and work tasks.    Baseline 4/27: DHI score is 16, indicating mild handicap 7/6: 22%    Time 11    Period Weeks    Status On-going    Target Date 11/08/20                   Plan - 09/12/20 1200      Clinical Impression Statement Session somewhat limited secondary to pt increased fatigue. Pt with increased fatigue d/t recent procedures to identify mass in abdomen (her doctors are aware/following pt sx, per pt). Pt requires frequent rest breaks. She required CGA-min assist for balance exercises. Pt had the most difficutly with toe taps onto cones, but did show overall improvement with postural stability with balance on compliant surfaces. The pt will benefit from further skilled PT to continue to improve strength, gait and balance to decrease fall risk.    Personal Factors and Comorbidities Age;Comorbidity 1;Comorbidity 2;Past/Current Experience;Sex;Social Background;Behavior Pattern;Comorbidity 3+    Comorbidities CVA in Jan 2022, anxiety, hypothyroidism, depression, OA, osteoporosis, memory change, migraine, seizures, hearing loss.    Examination-Activity Limitations Bathing;Stairs;Stand;Hygiene/Grooming;Locomotion Level;Carry;Bed Mobility;Other;Reach Overhead;Lift    Examination-Participation Restrictions Community Activity;Yard Work;Shop;Cleaning;Laundry;Medication Management;Meal Prep    Stability/Clinical Decision Making Evolving/Moderate complexity    Rehab Potential Good    PT Frequency 2x / week    PT Duration 12 weeks    PT Treatment/Interventions ADLs/Self Care Home Management;Biofeedback;Canalith Repostioning;Cryotherapy;Electrical Stimulation;Aquatic Therapy;Moist Heat;Ultrasound;DME Instruction;Gait training;Stair training;Functional mobility training;Therapeutic activities;Therapeutic exercise;Balance training;Neuromuscular re-education;Cognitive remediation;Patient/family education;Orthotic Fit/Training;Manual techniques;Passive range of motion;Energy conservation;Taping;Splinting;Vestibular;Visual/perceptual remediation/compensation;Joint Manipulations    PT Next Visit Plan dynamic balance tasks with head turns, balance with EC    PT Home Exercise Plan Access Code: [email or web address], added:   Access Code: [email or web address], no updates    Consulted and Agree with Plan of Care Patient             Patient will benefit from skilled therapeutic  intervention in order to improve the following deficits and impairments:  Abnormal gait, Decreased balance, Decreased endurance, Difficulty walking, Decreased range of motion, Dizziness, Improper body mechanics, Impaired vision/preception, Decreased coordination, Decreased strength, Decreased mobility, Obesity, Decreased activity tolerance, Decreased knowledge of use of DME, Impaired flexibility, Postural dysfunction  Visit Diagnosis: Unsteadiness on feet  Other abnormalities of gait and mobility  Other lack of coordination     Problem List Patient Active Problem List   Diagnosis Date Noted   Thickened endometrium 08/13/2020   Acute ITP ([place]) 05/02/2020   Other intra-abdominal and pelvic swelling, mass and lump 04/26/2020   Pelvic mass in female 04/25/2020   Ovarian cancer screening 04/25/2020   Ovarian mass, left 04/25/2020   Stroke (cerebrum) ([place]) 04/20/2020   Acute CVA (cerebrovascular accident) ([place]) 04/19/2020   CVA (cerebral vascular accident) ([place]) 04/19/2020   History of ischemic multifocal posterior circulation stroke 02/29/2020   Thrombocytopenia ([place]) 02/28/2020   HPV test positive 07/08/2015   Difficulty hearing 06/28/2015   Arthritis, degenerative 06/28/2015   Obesity, Class I, BMI 30-34.9 06/28/2015   Paresthesia 06/28/2015   Purpura, nonthrombopenic ([place]) 06/28/2015   Other specified hypothyroidism 08/09/2014   Migraine without aura and without status migrainosus, not intractable 09/19/2008   Moderate major depression ([place]) 09/07/2006   OP (osteoporosis) 09/07/2006   Ricard Dillon PT, DPT  09/12/2020, 12:04 PM  [place] MAIN [facility] SERVICES [address] [place], [place], [address] Phone: [phone]   Fax:  [phone]  Name: Natalie [name] MRN: [id number] Date of  Birth: 03/13/1950

## 2020-09-13 ENCOUNTER — Telehealth: Payer: Self-pay

## 2020-09-13 ENCOUNTER — Ambulatory Visit: Payer: Medicare Other

## 2020-09-13 NOTE — Telephone Encounter (Signed)
Spoke with Ms. Clasby, per Dr. Berline Lopes you can go back up to 2 (provera) if bleeding is not decreasing on 1. Patient is in agreement and verbalized understanding.

## 2020-09-13 NOTE — Telephone Encounter (Signed)
Spoke with Natalie [name] about her bleeding. She reports it  has slowed and comes in spurts. Patient states I took 2 pills (provera) Monday, 2 pills Tuesday, 1 yesterday and 1 today. Yesterday I went thru 2 pads with bright red blood. It's better than it was but its still there. She states buying heavier pads to have on hand. Informed patient that I will let Dr. Berline Lopes know.  Patient will call with any questions or concerns.

## 2020-09-17 ENCOUNTER — Encounter: Payer: Self-pay | Admitting: Gynecologic Oncology

## 2020-09-18 ENCOUNTER — Encounter: Payer: Self-pay | Admitting: Gynecologic Oncology

## 2020-09-18 ENCOUNTER — Ambulatory Visit: Payer: Medicare Other | Admitting: Physical Therapy

## 2020-09-18 ENCOUNTER — Other Ambulatory Visit: Payer: Self-pay

## 2020-09-18 DIAGNOSIS — M6281 Muscle weakness (generalized): Secondary | ICD-10-CM | POA: Diagnosis not present

## 2020-09-18 DIAGNOSIS — R2689 Other abnormalities of gait and mobility: Secondary | ICD-10-CM

## 2020-09-18 DIAGNOSIS — R278 Other lack of coordination: Secondary | ICD-10-CM | POA: Diagnosis not present

## 2020-09-18 DIAGNOSIS — I63412 Cerebral infarction due to embolism of left middle cerebral artery: Secondary | ICD-10-CM

## 2020-09-18 DIAGNOSIS — R2681 Unsteadiness on feet: Secondary | ICD-10-CM

## 2020-09-18 NOTE — Therapy (Signed)
[place] MAIN [facility] SERVICES [address] [place], [place], [address] Phone: [phone]   Fax:  [phone]  Physical Therapy Treatment  Patient Details  Name: Natalie [name] MRN: [id number] Date of Birth: 1950/02/24 Referring Provider (PT): [name], MD   Encounter Date: 09/18/2020   PT End of Session - 09/18/20 1749     Visit Number 14    Number of Visits 35    Date for PT Re-Evaluation 11/08/20    Authorization Type Traditional Medicare with [employer] Supplemental: VL based on certification    Authorization Time Period 05/21/20-08/13/20    PT Start Time 1430    PT Stop Time 1515    PT Time Calculation (min) 45 min    Equipment Utilized During Treatment Gait belt    Activity Tolerance Patient tolerated treatment well    Behavior During Therapy [facility] for tasks assessed/performed             Past Medical History:  Diagnosis Date   Acute bronchospasm due to viral infection    Anemia    history of   CVA (cerebral vascular accident) ([place]) 02/28/2020   Depressive disorder    Epistaxis    Fatigue    Hearing loss    Hypothyroidism    Memory change    Migraine    Numbness and tingling    Obesity (BMI 30.0-34.9)    Osteoarthritis    Osteoporosis    Polyuria    Premature menopause    Seizures ([place])    history of mini seizures, possible migraine induced    Past Surgical History:  Procedure Laterality Date   ABLATION  2006   BUBBLE STUDY  04/23/2020   Procedure: BUBBLE STUDY;  Surgeon: Larey Dresser, MD;  Location: [place];  Service: Cardiovascular;;   CATARACT EXTRACTION W/ INTRAOCULAR LENS IMPLANT Bilateral    COLONOSCOPY     COLONOSCOPY WITH PROPOFOL N/A 12/22/2018   Procedure: COLONOSCOPY WITH PROPOFOL;  Surgeon: Virgel Manifold, MD;  Location: [facility] ENDOSCOPY;  Service: Endoscopy;  Laterality: N/A;   EYE SURGERY  Spring 2018   Cataracts both eyes   LOOP RECORDER INSERTION N/A 04/23/2020   Procedure:  LOOP RECORDER INSERTION;  Surgeon: Evans Lance, MD;  Location: [place] CV LAB;  Service: Cardiovascular;  Laterality: N/A;   TEE WITHOUT CARDIOVERSION N/A 04/23/2020   Procedure: TRANSESOPHAGEAL ECHOCARDIOGRAM (TEE);  Surgeon: Larey Dresser, MD;  Location: [facility] ENDOSCOPY;  Service: Cardiovascular;  Laterality: N/A;   TONSILLECTOMY      There were no vitals filed for this visit.   Subjective Assessment - 09/18/20 1434     Subjective Pt reports unsure about surgery at this time; she is waiting to hear back from her medical team. States she is tired however feeling better today. She drove here for the first time in 2 months. No pain. No falls or LOB. No quesions at this time.    Pertinent History Pt confirms the following hx is accurate (initially from acute care PT eval): "the pt is a 70 yo female presenting 3/17 with confusion and difficulty word-finding. Imaging revealed L MCA infarcts with nearly occlusive thrombus in proximal M2 and M3." PMH includes the following: CVA in Jan 2022 (for further details see ACUTE PT eval in EMR from 02/29/2020), anxiety, hypothyroidism, depression, OA, osteoporosis, memory change, migraine, seizures, hearing loss.    Limitations Standing;Walking;Lifting;House hold activities;Writing    How long can you sit comfortably? not limited  How long can you stand comfortably? Pt reports she doesn't like to stand still for very long    How long can you walk comfortably? Difficulty with balance when walking over uneven ground, but states she is able to walk a mile. She says sometimes she gets tired walking and will sit down. She feels overall she's been fairly energetic.    Diagnostic tests per chart: "[place] 04/19/20: MR BRAIN IMPRESSION:  1. Patchy small volume acute ischemic nonhemorrhagic posterior left  MCA distribution infarcts, likely reflecting an embolic shower. No  associated hemorrhage or mass effect.  2. No other acute intracranial abnormality.  3.  Underlying mild chronic microvascular ischemic disease.; 3/17 CT ANGIO HEAD NECK:  IMPRESSION:  Nearly occlusive thrombus at the bifurcation of a proximal M2 MCA  branch approximately 6 mm from the MCA bifurcation. Subsequent  occlusion of a proximal left M3 MCA branch. No stenosis in the neck."    Patient Stated Goals Pt says she wants to improve her balance and does not want to fall again    Currently in Pain? No/denies               TREATMENT  Neuro Re-Ed At support surface with CGA-min a throughout: Airex pad WBOS- 60 sec Airex pad NBOS - 60 sec Airex pad NBOS with vertical head-turns - 2x10 reps each direction Airex pad, NBOS, with horizontal head-turns -  2x10 reps each direction Airex pad, NBOS, EC - 2x60 sec, requires up to min a d/t LOB. Seated rest breaks and standing rest breaks throughout   SLB 4x30-45 sec; intermittent UE support, CGA VC on weight shift, given ball to hold to occupy BUE from reaching for bar.   Toe taps onto cones - alternating x 25 reps. Single leg for SLB x10 reps each. Pt rates moderate Seated rest break    Stepping over hurdle forward/backward 15x each direction, each LE Seated rest break   Tandem stance - 2x30 sec BLEs    Pt requires CGA-min a throughout and intermittent UE support to maintain  balance for majority of exercises.      Clinical Impression  Session somewhat limited by increased number of rest breaks due to fatigue. Pt sat down and closed her eyes multiple times; when PT checked on pt she stated she was "fine" and ready to work again. PT maintained intensity of balance exercises in today's session. She did report feeling more steady throughout today's session compared to last session. Pt was VC on weight shifting, COM and slow deliberate movements during SLB and tandem exercises. Multiple times pt attempted to correct balance using UE support prior to ankle/hip strategies. PT asked pt to hold a lightweight ball to her chest to  occupy hands, increased ankle and hip strategies observed. She continues to require CGA-min assist for balance exercises. She has not been compliant with HEP due to fatigue and dizziness; states she will return to performing HEP consistently now that she is "feeling better." Pt will benefit from further skilled PT to continue to improve strength, gait and balance to decrease fall risk.         PT Short Term Goals - 08/23/20 1651       PT SHORT TERM GOAL #1   Title Pt will be independent with HEP in order to improve strength and balance in order to decrease fall risk and improve function at home and work.    Baseline 4/18: to be initiated 7/6: HEP compliant, new HEP given  Time 6    Period Weeks    Status Partially Met    Target Date 10/04/20               PT Long Term Goals - 08/23/20 1709       PT LONG TERM GOAL #1   Title Pt will improve DGI by at least 3 points in order to demonstrate clinically significant improvement in balance and decreased risk for falls.    Baseline 4/18: 18/24; 7/12: unstable BP, defer to next visit    Time 12    Period Weeks    Status New    Target Date 11/08/20      PT LONG TERM GOAL #2   Title Pt will improve ABC scale by at least 13% in order to demonstrate clinically significant improvement in balance confidence.    Baseline 4/18: 63.75% 7/6: 74%    Time 12    Period Weeks    Status On-going    Target Date 11/08/20      PT LONG TERM GOAL #3   Title Pt will demonstrate improved ability to maintain balance on compliant surface with EC for at least 5 seconds to indicate improved ability to navigate uneven surfaces and ambulate in dimly lit rooms.    Baseline 4/18: Pt unable to maintain balance with EC on foam on CTSIB-M 7/6: met    Time 12    Period Weeks    Status Achieved      PT LONG TERM GOAL #4   Title Patient will increase BLE gross strength to 4+/5 as to improve functional strength for independent gait, increased standing  tolerance and increased ADL ability.    Baseline 4/18: gross BLE strength currnelty 4/5 7/6: hips 4+/5 ankles 4/5    Time 12    Period Weeks    Status On-going    Target Date 11/08/20      PT LONG TERM GOAL #5   Title Patient will increase FOTO score to equal to or greater than 80 to demonstrate improvement in mobility and quality of life.    Baseline 4/18: 74% 7/6: 59%; 7/21: 65    Time 12    Period Weeks    Status On-going    Target Date 11/08/20      PT LONG TERM GOAL #6   Title Patient will reduce dizziness handicap inventory score to <10, for less dizziness with ADLs and increased safety with home and work tasks.    Baseline 4/27: DHI score is 16, indicating mild handicap 7/6: 22%    Time 11    Period Weeks    Status On-going    Target Date 11/08/20                   Plan - 09/18/20 1750     Personal Factors and Comorbidities Age;Comorbidity 1;Comorbidity 2;Past/Current Experience;Sex;Social Background;Behavior Pattern;Comorbidity 3+    Comorbidities CVA in Jan 2022, anxiety, hypothyroidism, depression, OA, osteoporosis, memory change, migraine, seizures, hearing loss.    Examination-Activity Limitations Bathing;Stairs;Stand;Hygiene/Grooming;Locomotion Level;Carry;Bed Mobility;Other;Reach Overhead;Lift    Examination-Participation Restrictions Community Activity;Yard Work;Shop;Cleaning;Laundry;Medication Management;Meal Prep    Stability/Clinical Decision Making Evolving/Moderate complexity    Rehab Potential Good    PT Frequency 2x / week    PT Duration 12 weeks    PT Treatment/Interventions ADLs/Self Care Home Management;Biofeedback;Canalith Repostioning;Cryotherapy;Electrical Stimulation;Aquatic Therapy;Moist Heat;Ultrasound;DME Instruction;Gait training;Stair training;Functional mobility training;Therapeutic activities;Therapeutic exercise;Balance training;Neuromuscular re-education;Cognitive remediation;Patient/family education;Orthotic Fit/Training;Manual  techniques;Passive range of motion;Energy conservation;Taping;Splinting;Vestibular;Visual/perceptual remediation/compensation;Joint Manipulations    PT Next  Visit Plan dynamic balance tasks with head turns, balance with EC    PT Home Exercise Plan Access Code: [email or web address], added:  Access Code: [email or web address], no updates    Consulted and Agree with Plan of Care Patient             Patient will benefit from skilled therapeutic intervention in order to improve the following deficits and impairments:  Abnormal gait, Decreased balance, Decreased endurance, Difficulty walking, Decreased range of motion, Dizziness, Improper body mechanics, Impaired vision/preception, Decreased coordination, Decreased strength, Decreased mobility, Obesity, Decreased activity tolerance, Decreased knowledge of use of DME, Impaired flexibility, Postural dysfunction  Visit Diagnosis: Unsteadiness on feet  Other abnormalities of gait and mobility  Other lack of coordination  Muscle weakness (generalized)  Cerebrovascular accident (CVA) due to embolism of left middle cerebral artery ([place])     Problem List Patient Active Problem List   Diagnosis Date Noted   Thickened endometrium 08/13/2020   Acute ITP ([place]) 05/02/2020   Other intra-abdominal and pelvic swelling, mass and lump 04/26/2020   Pelvic mass in female 04/25/2020   Ovarian cancer screening 04/25/2020   Ovarian mass, left 04/25/2020   Stroke (cerebrum) ([place]) 04/20/2020   Acute CVA (cerebrovascular accident) ([place]) 04/19/2020   CVA (cerebral vascular accident) ([place]) 04/19/2020   History of ischemic multifocal posterior circulation stroke 02/29/2020   Thrombocytopenia ([place]) 02/28/2020   HPV test positive 07/08/2015   Difficulty hearing 06/28/2015   Arthritis, degenerative 06/28/2015   Obesity, Class I, BMI 30-34.9 06/28/2015   Paresthesia 06/28/2015   Purpura, nonthrombopenic ([place]) 06/28/2015   Other specified hypothyroidism 08/09/2014   Migraine without  aura and without status migrainosus, not intractable 09/19/2008   Moderate major depression ([place]) 09/07/2006   OP (osteoporosis) 09/07/2006    Patrina Levering PT, DPT  [facility] 09/18/2020, 5:50 PM  [place] MAIN [facility] SERVICES [address] [place], [place], [address] Phone: [phone]   Fax:  [phone]  Name: Natalie [name] MRN: [id number] Date of Birth: [birth year]

## 2020-09-20 ENCOUNTER — Other Ambulatory Visit: Payer: Self-pay

## 2020-09-20 ENCOUNTER — Encounter: Payer: Self-pay | Admitting: Family Medicine

## 2020-09-20 ENCOUNTER — Ambulatory Visit: Payer: Medicare Other

## 2020-09-20 DIAGNOSIS — I63412 Cerebral infarction due to embolism of left middle cerebral artery: Secondary | ICD-10-CM | POA: Diagnosis not present

## 2020-09-20 DIAGNOSIS — R2681 Unsteadiness on feet: Secondary | ICD-10-CM | POA: Diagnosis not present

## 2020-09-20 DIAGNOSIS — R278 Other lack of coordination: Secondary | ICD-10-CM | POA: Diagnosis not present

## 2020-09-20 DIAGNOSIS — R2689 Other abnormalities of gait and mobility: Secondary | ICD-10-CM | POA: Diagnosis not present

## 2020-09-20 DIAGNOSIS — M6281 Muscle weakness (generalized): Secondary | ICD-10-CM | POA: Diagnosis not present

## 2020-09-20 DIAGNOSIS — Z8673 Personal history of transient ischemic attack (TIA), and cerebral infarction without residual deficits: Secondary | ICD-10-CM | POA: Diagnosis not present

## 2020-09-20 NOTE — Therapy (Signed)
[place] MAIN [facility] SERVICES [address] [place], [place], [address] Phone: [phone]   Fax:  [phone]  Physical Therapy Treatment  Patient Details  Name: ERANDI LEMMA MRN: [id number] Date of Birth: 18-Jun-1950 Referring Provider (PT): [name], MD   Encounter Date: 09/20/2020   PT End of Session - 09/20/20 1522     Visit Number 15    Number of Visits 35    Date for PT Re-Evaluation 11/08/20    Authorization Type Traditional Medicare with [employer] Supplemental: VL based on certification    Authorization Time Period 08/14/20-11/08/20    PT Start Time 1518    PT Stop Time 1558    PT Time Calculation (min) 40 min    Equipment Utilized During Treatment Gait belt    Activity Tolerance Patient tolerated treatment well    Behavior During Therapy [facility] for tasks assessed/performed             Past Medical History:  Diagnosis Date   Acute bronchospasm due to viral infection    Anemia    history of   CVA (cerebral vascular accident) ([place]) 02/28/2020   Depressive disorder    Epistaxis    Fatigue    Hearing loss    Hypothyroidism    Memory change    Migraine    Numbness and tingling    Obesity (BMI 30.0-34.9)    Osteoarthritis    Osteoporosis    Polyuria    Premature menopause    Seizures ([place])    history of mini seizures, possible migraine induced    Past Surgical History:  Procedure Laterality Date   ABLATION  2006   BUBBLE STUDY  04/23/2020   Procedure: BUBBLE STUDY;  Surgeon: Larey Dresser, MD;  Location: [place];  Service: Cardiovascular;;   CATARACT EXTRACTION W/ INTRAOCULAR LENS IMPLANT Bilateral    COLONOSCOPY     COLONOSCOPY WITH PROPOFOL N/A 12/22/2018   Procedure: COLONOSCOPY WITH PROPOFOL;  Surgeon: Virgel Manifold, MD;  Location: [facility] ENDOSCOPY;  Service: Endoscopy;  Laterality: N/A;   EYE SURGERY  Spring 2018   Cataracts both eyes   LOOP RECORDER INSERTION N/A 04/23/2020   Procedure:  LOOP RECORDER INSERTION;  Surgeon: Evans Lance, MD;  Location: [place] CV LAB;  Service: Cardiovascular;  Laterality: N/A;   TEE WITHOUT CARDIOVERSION N/A 04/23/2020   Procedure: TRANSESOPHAGEAL ECHOCARDIOGRAM (TEE);  Surgeon: Larey Dresser, MD;  Location: [facility] ENDOSCOPY;  Service: Cardiovascular;  Laterality: N/A;   TONSILLECTOMY      There were no vitals filed for this visit.   Subjective Assessment - 09/20/20 1520     Subjective Pt denies any updates. Is doing good overall. She still feels limited with upright activity, limitedto ~3-5 minutes prior to onset global fatigue and dizziness.    Pertinent History Pt confirms the following hx is accurate (initially from acute care PT eval): "the pt is a 70 yo female presenting 3/17 with confusion and difficulty word-finding. Imaging revealed L MCA infarcts with nearly occlusive thrombus in proximal M2 and M3." PMH includes the following: CVA in Jan 2022 (for further details see ACUTE PT eval in EMR from 02/29/2020), anxiety, hypothyroidism, depression, OA, osteoporosis, memory change, migraine, seizures, hearing loss.    Currently in Pain? No/denies             Pre-session vitals: -131/25mHg 81bpm seated  -111/677mg 68bpm standing  INTERVENTION: -At support surface with CGA-min a throughout: -Airex pad NBOS- 60 sec 2x -  Airex pad WBOS - horizontal head turns x20, vertical head turns 20x5secH  -6 inch step taps tapping midline, 30x alternating sides  -lateral stepping and retro stepping in // bars 6x each  -180 degree turns in // bars x10 *post standing exercise BP: 102/74mHg  *extensive education on orthostatis signs/symptoms.        PT Short Term Goals - 08/23/20 1651       PT SHORT TERM GOAL #1   Title Pt will be independent with HEP in order to improve strength and balance in order to decrease fall risk and improve function at home and work.    Baseline 4/18: to be initiated 7/6: HEP compliant, new HEP given     Time 6    Period Weeks    Status Partially Met    Target Date 10/04/20               PT Long Term Goals - 08/23/20 1709       PT LONG TERM GOAL #1   Title Pt will improve DGI by at least 3 points in order to demonstrate clinically significant improvement in balance and decreased risk for falls.    Baseline 4/18: 18/24; 7/12: unstable BP, defer to next visit    Time 12    Period Weeks    Status New    Target Date 11/08/20      PT LONG TERM GOAL #2   Title Pt will improve ABC scale by at least 13% in order to demonstrate clinically significant improvement in balance confidence.    Baseline 4/18: 63.75% 7/6: 74%    Time 12    Period Weeks    Status On-going    Target Date 11/08/20      PT LONG TERM GOAL #3   Title Pt will demonstrate improved ability to maintain balance on compliant surface with EC for at least 5 seconds to indicate improved ability to navigate uneven surfaces and ambulate in dimly lit rooms.    Baseline 4/18: Pt unable to maintain balance with EC on foam on CTSIB-M 7/6: met    Time 12    Period Weeks    Status Achieved      PT LONG TERM GOAL #4   Title Patient will increase BLE gross strength to 4+/5 as to improve functional strength for independent gait, increased standing tolerance and increased ADL ability.    Baseline 4/18: gross BLE strength currnelty 4/5 7/6: hips 4+/5 ankles 4/5    Time 12    Period Weeks    Status On-going    Target Date 11/08/20      PT LONG TERM GOAL #5   Title Patient will increase FOTO score to equal to or greater than 80 to demonstrate improvement in mobility and quality of life.    Baseline 4/18: 74% 7/6: 59%; 7/21: 65    Time 12    Period Weeks    Status On-going    Target Date 11/08/20      PT LONG TERM GOAL #6   Title Patient will reduce dizziness handicap inventory score to <10, for less dizziness with ADLs and increased safety with home and work tasks.    Baseline 4/27: DHI score is 16, indicating mild handicap  7/6: 22%    Time 11    Period Weeks    Status On-going    Target Date 11/08/20  Plan - 09/20/20 1530     Clinical Impression Statement Continued with balance training and postural motor control education. Pt orthostatic in session, but not really symptomatic. Author takes additional history regarding orthostasis issues, sounds though this has been an ongoing phenomenon for a long time, however pt reports no formal diagnosis or extensive discussion. Will discuss with primary therapist and decide on POC updates as needed.    Personal Factors and Comorbidities Age;Comorbidity 1;Comorbidity 2;Past/Current Experience;Sex;Social Background;Behavior Pattern;Comorbidity 3+    Comorbidities CVA in Jan 2022, anxiety, hypothyroidism, depression, OA, osteoporosis, memory change, migraine, seizures, hearing loss.    Examination-Activity Limitations Bathing;Stairs;Stand;Hygiene/Grooming;Locomotion Level;Carry;Bed Mobility;Other;Reach Overhead;Lift    Examination-Participation Restrictions [place]    Stability/Clinical Decision Making Evolving/Moderate complexity    Clinical Decision Making Moderate    Rehab Potential Good    PT Frequency 2x / week    PT Duration 12 weeks    PT Treatment/Interventions ADLs/Self Care Home Management;Biofeedback;Canalith Repostioning;Cryotherapy;Electrical Stimulation;Aquatic Therapy;Moist Heat;Ultrasound;DME Instruction;Gait training;Stair training;Functional mobility training;Therapeutic activities;Therapeutic exercise;Balance training;Neuromuscular re-education;Cognitive remediation;Patient/family education;Orthotic Fit/Training;Manual techniques;Passive range of motion;Energy conservation;Taping;Splinting;Vestibular;Visual/perceptual remediation/compensation;Joint Manipulations    PT Next Visit Plan dynamic balance tasks with head turns, balance with EC    PT Home Exercise Plan Access Code: [email or web address], added:  Access Code: [email or web address], no updates     Consulted and Agree with Plan of Care Patient             Patient will benefit from skilled therapeutic intervention in order to improve the following deficits and impairments:  Abnormal gait, Decreased balance, Decreased endurance, Difficulty walking, Decreased range of motion, Dizziness, Improper body mechanics, Impaired vision/preception, Decreased coordination, Decreased strength, Decreased mobility, Obesity, Decreased activity tolerance, Decreased knowledge of use of DME, Impaired flexibility, Postural dysfunction  Visit Diagnosis: Unsteadiness on feet  Other abnormalities of gait and mobility  Other lack of coordination  Muscle weakness (generalized)  Cerebrovascular accident (CVA) due to embolism of left middle cerebral artery ([place])     Problem List Patient Active Problem List   Diagnosis Date Noted   Thickened endometrium 08/13/2020   Acute ITP ([place]) 05/02/2020   Other intra-abdominal and pelvic swelling, mass and lump 04/26/2020   Pelvic mass in female 04/25/2020   Ovarian cancer screening 04/25/2020   Ovarian mass, left 04/25/2020   Stroke (cerebrum) ([place]) 04/20/2020   Acute CVA (cerebrovascular accident) ([place]) 04/19/2020   CVA (cerebral vascular accident) ([place]) 04/19/2020   History of ischemic multifocal posterior circulation stroke 02/29/2020   Thrombocytopenia ([place]) 02/28/2020   HPV test positive 07/08/2015   Difficulty hearing 06/28/2015   Arthritis, degenerative 06/28/2015   Obesity, Class I, BMI 30-34.9 06/28/2015   Paresthesia 06/28/2015   Purpura, nonthrombopenic ([place]) 06/28/2015   Other specified hypothyroidism 08/09/2014   Migraine without aura and without status migrainosus, not intractable 09/19/2008   Moderate major depression ([place]) 09/07/2006   OP (osteoporosis) 09/07/2006   4:05 PM, 09/20/20 Etta Grandchild, PT, DPT Physical Therapist - [place] [phone]      [place] [facility] 09/20/2020, 3:44 PM  [place] [facility] MAIN [facility] SERVICES [address] [place], [place], [address] Phone: [phone]   Fax:  [phone]  Name: ARIELYS WANDERSEE MRN: [id number] Date of Birth: Sep 13, 1950

## 2020-09-21 ENCOUNTER — Telehealth: Payer: Self-pay

## 2020-09-21 NOTE — Telephone Encounter (Signed)
Copied from [place] [id number]. Topic: General - Other >> Sep 21, 2020 12:04 PM Leward Quan A wrote: Reason for [id number] with Dr Lulu Riding office called in to inquire of Dr Ancil Boozer about a medical clearance form That was faxed over a few weeks ago. Was faxed again today asking that this form be completed and returned so patient can be scheduled for her surgery. Any questions please call Bella Kennedy at Ph# [phone]

## 2020-09-24 NOTE — Telephone Encounter (Signed)
Form on your desk, please fill out

## 2020-09-25 ENCOUNTER — Ambulatory Visit: Payer: Medicare Other

## 2020-09-25 ENCOUNTER — Telehealth: Payer: Self-pay

## 2020-09-25 ENCOUNTER — Other Ambulatory Visit: Payer: Self-pay | Admitting: Family Medicine

## 2020-09-25 DIAGNOSIS — E559 Vitamin D deficiency, unspecified: Secondary | ICD-10-CM

## 2020-09-25 NOTE — Telephone Encounter (Signed)
Requested medication (s) are due for refill today:yes   Requested medication (s) are on the active medication list:  yes   Last refill:  06/24/2020  Future visit scheduled: yes  Notes to clinic:  this refill cannot be delegated    Requested Prescriptions  Pending Prescriptions Disp Refills   Vitamin D, Ergocalciferol, (DRISDOL) 1.25 MG (50000 UNIT) CAPS capsule [Pharmacy Med Name: VITAMIN D2 50,000IU (ERGO) CAP RX] 12 capsule 1    Sig: TAKE 1 CAPSULE BY MOUTH EVERY 7 DAYS     Endocrinology:  Vitamins - Vitamin D Supplementation Failed - 09/25/2020 11:24 AM      Failed - 50,000 IU strengths are not delegated      Failed - Vitamin D in normal range and within 360 days    Vit D, 25-Hydroxy  Date Value Ref Range Status  05/10/2019 73 30 - 100 ng/mL Final    Comment:    Vitamin D Status         25-OH Vitamin D: . Deficiency:                    <20 ng/mL Insufficiency:             20 - 29 ng/mL Optimal:                 > or = 30 ng/mL . For 25-OH Vitamin D testing on patients on  D2-supplementation and patients for whom quantitation  of D2 and D3 fractions is required, the Quest[employer]ureD(TM) 25-OH VIT D, (D2,D3), LC/MS/MS is recommended: order  code [id number] (patients >36yr). See Note 1 . Note 1 . For additional information, please refer to  http://education.QuestDiagnostics.com/faq/FAQ199  (This link is being provided for informational/ educational purposes only.)           Passed - Ca in normal range and within 360 days    Calcium  Date Value Ref Range Status  08/05/2020 8.9 8.9 - 10.3 mg/dL Final          Passed - Phosphate in normal range and within 360 days    Phosphorus  Date Value Ref Range Status  04/21/2020 3.0 2.5 - 4.6 mg/dL Final    Comment:    [facility] [address] [place] [address]         Passed - Valid encounter within last 12 months    Recent Outpatient Visits           1 month ago Recurrent cerebrovascular  accidents (CVAs) [facility]   4 months ago Recurrent [facility]vascular accidents (CVAs) [facility]   [facility]   6 months ago History of CVA in adulthood   [facility]   8 months ago Double vision with both eyes open   [facility]   10 months ago [facility] hypothy[facility]       Future Appointments             In 2 months [place] [facility]  [facility] 2 [facility]

## 2020-09-25 NOTE — Telephone Encounter (Signed)
Left message for Natalie [name] letting her know we have received her clearance forms and will review them. Our office will contact her next week as Dr. Berline Lopes is out of the office this week. Instructed to call with any questions or concerns at [phone].

## 2020-09-26 ENCOUNTER — Encounter: Payer: Self-pay | Admitting: Gynecologic Oncology

## 2020-09-27 ENCOUNTER — Other Ambulatory Visit: Payer: Self-pay

## 2020-09-27 ENCOUNTER — Ambulatory Visit: Payer: Medicare Other

## 2020-09-27 DIAGNOSIS — R2681 Unsteadiness on feet: Secondary | ICD-10-CM | POA: Diagnosis not present

## 2020-09-27 DIAGNOSIS — I63412 Cerebral infarction due to embolism of left middle cerebral artery: Secondary | ICD-10-CM | POA: Diagnosis not present

## 2020-09-27 DIAGNOSIS — R278 Other lack of coordination: Secondary | ICD-10-CM

## 2020-09-27 DIAGNOSIS — M6281 Muscle weakness (generalized): Secondary | ICD-10-CM | POA: Diagnosis not present

## 2020-09-27 DIAGNOSIS — R2689 Other abnormalities of gait and mobility: Secondary | ICD-10-CM | POA: Diagnosis not present

## 2020-09-27 NOTE — Therapy (Signed)
[place] MAIN [facility] SERVICES [address] [place], [place], [address] Phone: [phone]   Fax:  [phone]  Physical Therapy Treatment  Patient Details  Name: Natalie [name] MRN: [id number] Date of Birth: 1950-08-17 Referring Provider (PT): [name], MD   Encounter Date: 09/27/2020   PT End of Session - 09/27/20 2024     Visit Number 16    Number of Visits 35    Date for PT Re-Evaluation 11/08/20    Authorization Type Traditional Medicare with [employer] Supplemental: VL based on certification    Authorization Time Period 08/14/20-11/08/20    PT Start Time 1345    PT Stop Time 1429    PT Time Calculation (min) 44 min    Equipment Utilized During Treatment Gait belt    Activity Tolerance Patient tolerated treatment well    Behavior During Therapy [facility] for tasks assessed/performed             Past Medical History:  Diagnosis Date   Acute bronchospasm due to viral infection    Anemia    history of   CVA (cerebral vascular accident) ([place]) 02/28/2020   Depressive disorder    Epistaxis    Fatigue    Hearing loss    Hypothyroidism    Memory change    Migraine    Numbness and tingling    Obesity (BMI 30.0-34.9)    Osteoarthritis    Osteoporosis    Polyuria    Premature menopause    Seizures ([place])    history of mini seizures, possible migraine induced    Past Surgical History:  Procedure Laterality Date   ABLATION  2006   BUBBLE STUDY  04/23/2020   Procedure: BUBBLE STUDY;  Surgeon: Larey Dresser, MD;  Location: [place];  Service: Cardiovascular;;   CATARACT EXTRACTION W/ INTRAOCULAR LENS IMPLANT Bilateral    COLONOSCOPY     COLONOSCOPY WITH PROPOFOL N/A 12/22/2018   Procedure: COLONOSCOPY WITH PROPOFOL;  Surgeon: Virgel Manifold, MD;  Location: [facility] ENDOSCOPY;  Service: Endoscopy;  Laterality: N/A;   EYE SURGERY  Spring 2018   Cataracts both eyes   LOOP RECORDER INSERTION N/A 04/23/2020   Procedure:  LOOP RECORDER INSERTION;  Surgeon: Evans Lance, MD;  Location: [place] CV LAB;  Service: Cardiovascular;  Laterality: N/A;   TEE WITHOUT CARDIOVERSION N/A 04/23/2020   Procedure: TRANSESOPHAGEAL ECHOCARDIOGRAM (TEE);  Surgeon: Larey Dresser, MD;  Location: [facility] ENDOSCOPY;  Service: Cardiovascular;  Laterality: N/A;   TONSILLECTOMY      There were no vitals filed for this visit.   Subjective Assessment - 09/27/20 1436     Subjective Patient reports she was sore after her last session. No falls or LOB since last session.    Pertinent History Pt confirms the following hx is accurate (initially from acute care PT eval): "the pt is a 70 yo female presenting 3/17 with confusion and difficulty word-finding. Imaging revealed L MCA infarcts with nearly occlusive thrombus in proximal M2 and M3." PMH includes the following: CVA in Jan 2022 (for further details see ACUTE PT eval in EMR from 02/29/2020), anxiety, hypothyroidism, depression, OA, osteoporosis, memory change, migraine, seizures, hearing loss.    Limitations Standing;Walking;Lifting;House hold activities;Writing    How long can you stand comfortably? Pt reports she doesn't like to stand still for very long    How long can you walk comfortably? Difficulty with balance when walking over uneven ground, but states she is able to walk a  mile. She says sometimes she gets tired walking and will sit down. She feels overall she's been fairly energetic.    Diagnostic tests per chart: "[place] 04/19/20: MR BRAIN IMPRESSION:  1. Patchy small volume acute ischemic nonhemorrhagic posterior left  MCA distribution infarcts, likely reflecting an embolic shower. No  associated hemorrhage or mass effect.  2. No other acute intracranial abnormality.  3. Underlying mild chronic microvascular ischemic disease.; 3/17 CT ANGIO HEAD NECK:  IMPRESSION:  Nearly occlusive thrombus at the bifurcation of a proximal M2 MCA  branch approximately 6 mm from the MCA bifurcation.  Subsequent  occlusion of a proximal left M3 MCA branch. No stenosis in the neck."    Patient Stated Goals Pt says she wants to improve her balance and does not want to fall again    Currently in Pain? No/denies                   TREATMENT  Neuro Re-Ed Standing with CGA next to support surface:  Airex pad: static stand 30 seconds x 2 trials, noticeable trembling of ankles/LE's with fatigue and challenge to maintain stability Airex pad: horizontal head turns 30 seconds scanning room 10x ; cueing for arc of motion  Airex pad: vertical head turns 30 seconds, cueing for arc of motion, noticeable sway with upward gaze increasing demand on ankle righting reaction musculature Airex pad: one foot on 6" step one foot on airex pad, hold position for 30 seconds, switch legs, 2x each LE; Airex pad: 6" step airex pad lateral stepping up/down 10x each side; UE support when required   ambulate in hallway: -horizontal head turns with cues for reading alphabet from cards 86 ftx 2 sets -horizontal head turns with cues for reading number and symbols from cards 86 ft x 2 sets    6" step  -crane step up 10x each LE; SUE support  -toe taps for fast speed 30 seconds for velocity of recruitment; no UE support  Seated rest breaks and standing rest breaks throughout     6 cones:  Lateral step and toe tap 2x length; close CGA; knocking one cone over x 2 trials;  Forward/backward weaving  2 x length; very challenging coordination.   Modified posterior lunge 10x each LE; BUE support   Seated:   Sit to stand 10x;  RTB around bilateral feet alternating LAQ 15x each LE ; 3 second holds RTB around bilateral LE: alternating ER/IR 12x each LE    Pt requires CGA-min a throughout and intermittent UE support to maintain  balance for majority of exercises.    Patient requires frequent cueing to decrease velocity of intervention to improve muscle recruitment patterning. Patient is highly motivated  throughout session. She has intermittent fatigue of LE's that is only noted in standing tasks, her seated tasks are not challenging per patient report. Occasional rest breaks required with vitals monitored to ensure therapeutic ranges of vitals. Pt will benefit from further skilled PT to continue to improve strength, gait and balance to decrease fall risk.                  PT Education - 09/27/20 2024     Education Details exercise technique, body mechanics    Person(s) Educated Patient    Methods Explanation;Demonstration;Tactile cues;Verbal cues    Comprehension Verbalized understanding;Returned demonstration;Verbal cues required;Tactile cues required              PT Short Term Goals - 08/23/20 1651  PT SHORT TERM GOAL #1   Title Pt will be independent with HEP in order to improve strength and balance in order to decrease fall risk and improve function at home and work.    Baseline 4/18: to be initiated 7/6: HEP compliant, new HEP given    Time 6    Period Weeks    Status Partially Met    Target Date 10/04/20               PT Long Term Goals - 08/23/20 1709       PT LONG TERM GOAL #1   Title Pt will improve DGI by at least 3 points in order to demonstrate clinically significant improvement in balance and decreased risk for falls.    Baseline 4/18: 18/24; 7/12: unstable BP, defer to next visit    Time 12    Period Weeks    Status New    Target Date 11/08/20      PT LONG TERM GOAL #2   Title Pt will improve ABC scale by at least 13% in order to demonstrate clinically significant improvement in balance confidence.    Baseline 4/18: 63.75% 7/6: 74%    Time 12    Period Weeks    Status On-going    Target Date 11/08/20      PT LONG TERM GOAL #3   Title Pt will demonstrate improved ability to maintain balance on compliant surface with EC for at least 5 seconds to indicate improved ability to navigate uneven surfaces and ambulate in dimly lit rooms.     Baseline 4/18: Pt unable to maintain balance with EC on foam on CTSIB-M 7/6: met    Time 12    Period Weeks    Status Achieved      PT LONG TERM GOAL #4   Title Patient will increase BLE gross strength to 4+/5 as to improve functional strength for independent gait, increased standing tolerance and increased ADL ability.    Baseline 4/18: gross BLE strength currnelty 4/5 7/6: hips 4+/5 ankles 4/5    Time 12    Period Weeks    Status On-going    Target Date 11/08/20      PT LONG TERM GOAL #5   Title Patient will increase FOTO score to equal to or greater than 80 to demonstrate improvement in mobility and quality of life.    Baseline 4/18: 74% 7/6: 59%; 7/21: 65    Time 12    Period Weeks    Status On-going    Target Date 11/08/20      PT LONG TERM GOAL #6   Title Patient will reduce dizziness handicap inventory score to <10, for less dizziness with ADLs and increased safety with home and work tasks.    Baseline 4/27: DHI score is 16, indicating mild handicap 7/6: 22%    Time 11    Period Weeks    Status On-going    Target Date 11/08/20                   Plan - 09/27/20 2030     Clinical Impression Statement Patient requires frequent cueing to decrease velocity of intervention to improve muscle recruitment patterning. Patient is highly motivated throughout session. She has intermittent fatigue of LE's that is only noted in standing tasks, her seated tasks are not challenging per patient report. Occasional rest breaks required with vitals monitored to ensure therapeutic ranges of vitals. Pt will benefit from further skilled PT  to continue to improve strength, gait and balance to decrease fall risk.    Personal Factors and Comorbidities Age;Comorbidity 1;Comorbidity 2;Past/Current Experience;Sex;Social Background;Behavior Pattern;Comorbidity 3+    Comorbidities CVA in Jan 2022, anxiety, hypothyroidism, depression, OA, osteoporosis, memory change, migraine, seizures, hearing  loss.    Examination-Activity Limitations Bathing;Stairs;Stand;Hygiene/Grooming;Locomotion Level;Carry;Bed Mobility;Other;Reach Overhead;Lift    Examination-Participation Restrictions Community Activity;Yard Work;Shop;Cleaning;Laundry;Medication Management;Meal Prep    Stability/Clinical Decision Making Evolving/Moderate complexity    Rehab Potential Good    PT Frequency 2x / week    PT Duration 12 weeks    PT Treatment/Interventions ADLs/Self Care Home Management;Biofeedback;Canalith Repostioning;Cryotherapy;Electrical Stimulation;Aquatic Therapy;Moist Heat;Ultrasound;DME Instruction;Gait training;Stair training;Functional mobility training;Therapeutic activities;Therapeutic exercise;Balance training;Neuromuscular re-education;Cognitive remediation;Patient/family education;Orthotic Fit/Training;Manual techniques;Passive range of motion;Energy conservation;Taping;Splinting;Vestibular;Visual/perceptual remediation/compensation;Joint Manipulations    PT Next Visit Plan dynamic balance tasks with head turns, balance with EC    PT Home Exercise Plan Access Code: [email or web address], added:  Access Code: [email or web address], no updates    Consulted and Agree with Plan of Care Patient             Patient will benefit from skilled therapeutic intervention in order to improve the following deficits and impairments:  Abnormal gait, Decreased balance, Decreased endurance, Difficulty walking, Decreased range of motion, Dizziness, Improper body mechanics, Impaired vision/preception, Decreased coordination, Decreased strength, Decreased mobility, Obesity, Decreased activity tolerance, Decreased knowledge of use of DME, Impaired flexibility, Postural dysfunction  Visit Diagnosis: Unsteadiness on feet  Other abnormalities of gait and mobility  Other lack of coordination  Muscle weakness (generalized)     Problem List Patient Active Problem List   Diagnosis Date Noted   Thickened endometrium 08/13/2020   Acute ITP  ([place]) 05/02/2020   Other intra-abdominal and pelvic swelling, mass and lump 04/26/2020   Pelvic mass in female 04/25/2020   Ovarian cancer screening 04/25/2020   Ovarian mass, left 04/25/2020   Stroke (cerebrum) ([place]) 04/20/2020   Acute CVA (cerebrovascular accident) ([place]) 04/19/2020   CVA (cerebral vascular accident) ([place]) 04/19/2020   History of ischemic multifocal posterior circulation stroke 02/29/2020   Thrombocytopenia ([place]) 02/28/2020   HPV test positive 07/08/2015   Difficulty hearing 06/28/2015   Arthritis, degenerative 06/28/2015   Obesity, Class I, BMI 30-34.9 06/28/2015   Paresthesia 06/28/2015   Purpura, nonthrombopenic ([place]) 06/28/2015   Other specified hypothyroidism 08/09/2014   Migraine without aura and without status migrainosus, not intractable 09/19/2008   Moderate major depression ([place]) 09/07/2006   OP (osteoporosis) 09/07/2006    Janna Arch, PT, DPT  09/27/2020, 8:32 PM  [place] [facility] MAIN [facility] SERVICES [address] [place], [place], [address] Phone: [phone]   Fax:  [phone]  Name: Natalie [name] MRN: [id number] Date of Birth: January 25, 1951

## 2020-10-02 ENCOUNTER — Ambulatory Visit: Payer: Medicare Other

## 2020-10-02 ENCOUNTER — Other Ambulatory Visit: Payer: Self-pay

## 2020-10-02 DIAGNOSIS — R2681 Unsteadiness on feet: Secondary | ICD-10-CM

## 2020-10-02 DIAGNOSIS — R278 Other lack of coordination: Secondary | ICD-10-CM | POA: Diagnosis not present

## 2020-10-02 DIAGNOSIS — R2689 Other abnormalities of gait and mobility: Secondary | ICD-10-CM | POA: Diagnosis not present

## 2020-10-02 DIAGNOSIS — I63412 Cerebral infarction due to embolism of left middle cerebral artery: Secondary | ICD-10-CM | POA: Diagnosis not present

## 2020-10-02 DIAGNOSIS — M6281 Muscle weakness (generalized): Secondary | ICD-10-CM

## 2020-10-02 NOTE — Therapy (Signed)
[place] MAIN [facility] SERVICES [address] [place], [place], [address] Phone: [phone]   Fax:  [phone]  Physical Therapy Treatment  Patient Details  Name: Natalie [name] MRN: [id number] Date of Birth: [birth year] Referring Provider (PT): [name], MD   Encounter Date: 10/02/2020   PT End of Session - 10/02/20 1528     Visit Number 17    Number of Visits 35    Date for PT Re-Evaluation 11/08/20    Authorization Type Traditional Medicare with [employer] Supplemental: VL based on certification    Authorization Time Period 08/14/20-11/08/20    PT Start Time 1400    PT Stop Time 1430    PT Time Calculation (min) 30 min    Equipment Utilized During Treatment Gait belt    Activity Tolerance Patient tolerated treatment well    Behavior During Therapy [facility] for tasks assessed/performed             Past Medical History:  Diagnosis Date   Acute bronchospasm due to viral infection    Anemia    history of   CVA (cerebral vascular accident) ([place]) 02/28/2020   Depressive disorder    Epistaxis    Fatigue    Hearing loss    Hypothyroidism    Memory change    Migraine    Numbness and tingling    Obesity (BMI 30.0-34.9)    Osteoarthritis    Osteoporosis    Polyuria    Premature menopause    Seizures ([place])    history of mini seizures, possible migraine induced    Past Surgical History:  Procedure Laterality Date   ABLATION  2006   BUBBLE STUDY  04/23/2020   Procedure: BUBBLE STUDY;  Surgeon: Larey Dresser, MD;  Location: [place];  Service: Cardiovascular;;   CATARACT EXTRACTION W/ INTRAOCULAR LENS IMPLANT Bilateral    COLONOSCOPY     COLONOSCOPY WITH PROPOFOL N/A 12/22/2018   Procedure: COLONOSCOPY WITH PROPOFOL;  Surgeon: Virgel Manifold, MD;  Location: [facility] ENDOSCOPY;  Service: Endoscopy;  Laterality: N/A;   EYE SURGERY  Spring 2018   Cataracts both eyes   LOOP RECORDER INSERTION N/A 04/23/2020   Procedure:  LOOP RECORDER INSERTION;  Surgeon: Evans Lance, MD;  Location: [place] [facility];  Service: Cardiovascular;  Laterality: N/A;   TEE WITHOUT CARDIOVERSION N/A 04/23/2020   Procedure: TRANSESOPHAGEAL ECHOCARDIOGRAM (TEE);  Surgeon: Larey Dresser, MD;  Location: [facility] ENDOSCOPY;  Service: Cardiovascular;  Laterality: N/A;   TONSILLECTOMY      There were no vitals filed for this visit.   Subjective Assessment - 10/02/20 1527     Subjective Patient session limited by late arrival.  Patient is frustrated due to limited parking.  Patient also reports her surgery is not yet scheduled and she is certain since she has not heard yet    Pertinent History Pt confirms the following hx is accurate (initially from acute care PT eval): "the pt is a 70 yo female presenting 3/17 with confusion and difficulty word-finding. Imaging revealed L MCA infarcts with nearly occlusive thrombus in proximal M2 and M3." PMH includes the following: CVA in Jan 2022 (for further details see ACUTE PT eval in EMR from 02/29/2020), anxiety, hypothyroidism, depression, OA, osteoporosis, memory change, migraine, seizures, hearing loss.    Limitations Standing;Walking;Lifting;House hold activities;Writing    How long can you stand comfortably? Pt reports she doesn't like to stand still for very long    How long can you  walk comfortably? Difficulty with balance when walking over uneven ground, but states she is able to walk a mile. She says sometimes she gets tired walking and will sit down. She feels overall she's been fairly energetic.    Diagnostic tests per chart: "[place] 04/19/20: MR BRAIN IMPRESSION:  1. Patchy small volume acute ischemic nonhemorrhagic posterior left  MCA distribution infarcts, likely reflecting an embolic shower. No  associated hemorrhage or mass effect.  2. No other acute intracranial abnormality.  3. Underlying mild chronic microvascular ischemic disease.; 3/17 CT ANGIO HEAD NECK:  IMPRESSION:  Nearly occlusive  thrombus at the bifurcation of a proximal M2 MCA  branch approximately 6 mm from the MCA bifurcation. Subsequent  occlusion of a proximal left M3 MCA branch. No stenosis in the neck."    Patient Stated Goals Pt says she wants to improve her balance and does not want to fall again    Currently in Pain? No/denies                  TREATMENT  Neuro Re-Ed Standing with CGA next to support surface:   Airex pad: one foot on 6" step one foot on airex pad, hold position for 30 seconds, switch legs, 2x each LE; Airex pad: 6" step airex pad lateral stepping up/down 10x each side; UE support when required  Airex pad: hedgehog 3 hedgehogs tap with no UE uspport 8x each LE   Airex pad : weighted ball : chest press 10x, straight arm raise 10x   Three cone bend down and pick up 3x; terminated due to dizziness.   6" step up/down 10x each LE; no UE support, cue for sequencing and muscle activation     Pt requires CGA-min a throughout and intermittent UE support to maintain  balance for majority of exercises.   Pt educated throughout session about proper posture and technique with exercises. Improved exercise technique, movement at target joints, use of target muscles after min to mod verbal, visual, tactile cues.   Patient session severely limited by late arrival.  Patient is frustrated upon arrival and requires occasional cueing for task orientation.  Her ankle righting reactions are improving on stable and unstable surfaces.  Bending down was terminated due to increased dizziness which will continue to be an area of focus in future sessions.Pt will benefit from further skilled PT to continue to improve strength, gait and balance to decrease fall risk    Note: Portions of this document were prepared using Dragon voice recognition software and although reviewed may contain unintentional dictation errors in syntax, grammar, or spelling.                  PT Education - 10/02/20  1528     Education Details Exercise technique body mechanics stability    Person(s) Educated Patient    Methods Explanation;Demonstration;Tactile cues;Verbal cues    Comprehension Verbalized understanding;Returned demonstration;Verbal cues required;Tactile cues required              PT Short Term Goals - 08/23/20 1651       PT SHORT TERM GOAL #1   Title Pt will be independent with HEP in order to improve strength and balance in order to decrease fall risk and improve function at home and work.    Baseline 4/18: to be initiated 7/6: HEP compliant, new HEP given    Time 6    Period Weeks    Status Partially Met    Target Date 10/04/20  PT Long Term Goals - 08/23/20 1709       PT LONG TERM GOAL #1   Title Pt will improve DGI by at least 3 points in order to demonstrate clinically significant improvement in balance and decreased risk for falls.    Baseline 4/18: 18/24; 7/12: unstable BP, defer to next visit    Time 12    Period Weeks    Status New    Target Date 11/08/20      PT LONG TERM GOAL #2   Title Pt will improve ABC scale by at least 13% in order to demonstrate clinically significant improvement in balance confidence.    Baseline 4/18: 63.75% 7/6: 74%    Time 12    Period Weeks    Status On-going    Target Date 11/08/20      PT LONG TERM GOAL #3   Title Pt will demonstrate improved ability to maintain balance on compliant surface with EC for at least 5 seconds to indicate improved ability to navigate uneven surfaces and ambulate in dimly lit rooms.    Baseline 4/18: Pt unable to maintain balance with EC on foam on CTSIB-M 7/6: met    Time 12    Period Weeks    Status Achieved      PT LONG TERM GOAL #4   Title Patient will increase BLE gross strength to 4+/5 as to improve functional strength for independent gait, increased standing tolerance and increased ADL ability.    Baseline 4/18: gross BLE strength currnelty 4/5 7/6: hips 4+/5 ankles  4/5    Time 12    Period Weeks    Status On-going    Target Date 11/08/20      PT LONG TERM GOAL #5   Title Patient will increase FOTO score to equal to or greater than 80 to demonstrate improvement in mobility and quality of life.    Baseline 4/18: 74% 7/6: 59%; 7/21: 65    Time 12    Period Weeks    Status On-going    Target Date 11/08/20      PT LONG TERM GOAL #6   Title Patient will reduce dizziness handicap inventory score to <10, for less dizziness with ADLs and increased safety with home and work tasks.    Baseline 4/27: DHI score is 16, indicating mild handicap 7/6: 22%    Time 11    Period Weeks    Status On-going    Target Date 11/08/20                   Plan - 10/02/20 1530     Clinical Impression Statement Patient session severely limited by late arrival.  Patient is frustrated upon arrival and requires occasional cueing for task orientation.  Her ankle righting reactions are improving on stable and unstable surfaces.  Bending down was terminated due to increased dizziness which will continue to be an area of focus in future sessions.Pt will benefit from further skilled PT to continue to improve strength, gait and balance to decrease fall risk    Personal Factors and Comorbidities Age;Comorbidity 1;Comorbidity 2;Past/Current Experience;Sex;Social Background;Behavior Pattern;Comorbidity 3+    Comorbidities CVA in Jan 2022, anxiety, hypothyroidism, depression, OA, osteoporosis, memory change, migraine, seizures, hearing loss.    Examination-Activity Limitations Bathing;Stairs;Stand;Hygiene/Grooming;Locomotion Level;Carry;Bed Mobility;Other;Reach Overhead;Lift    Examination-Participation Restrictions Community Activity;Yard Work;Shop;Cleaning;Laundry;Medication Management;Meal Prep    Stability/Clinical Decision Making Evolving/Moderate complexity    Rehab Potential Good    PT Frequency 2x / week  PT Duration 12 weeks    PT Treatment/Interventions ADLs/Self  Care Home Management;Biofeedback;Canalith Repostioning;Cryotherapy;Electrical Stimulation;Aquatic Therapy;Moist Heat;Ultrasound;DME Instruction;Gait training;Stair training;Functional mobility training;Therapeutic activities;Therapeutic exercise;Balance training;Neuromuscular re-education;Cognitive remediation;Patient/family education;Orthotic Fit/Training;Manual techniques;Passive range of motion;Energy conservation;Taping;Splinting;Vestibular;Visual/perceptual remediation/compensation;Joint Manipulations    PT Next Visit Plan dynamic balance tasks with head turns, balance with EC    PT Home Exercise Plan Access Code: [email or web address], added:  Access Code: [email or web address], no updates    Consulted and Agree with Plan of Care Patient             Patient will benefit from skilled therapeutic intervention in order to improve the following deficits and impairments:  Abnormal gait, Decreased balance, Decreased endurance, Difficulty walking, Decreased range of motion, Dizziness, Improper body mechanics, Impaired vision/preception, Decreased coordination, Decreased strength, Decreased mobility, Obesity, Decreased activity tolerance, Decreased knowledge of use of DME, Impaired flexibility, Postural dysfunction  Visit Diagnosis: Unsteadiness on feet  Other abnormalities of gait and mobility  Muscle weakness (generalized)     Problem List Patient Active Problem List   Diagnosis Date Noted   Thickened endometrium 08/13/2020   Acute ITP ([place]) 05/02/2020   Other intra-abdominal and pelvic swelling, mass and lump 04/26/2020   Pelvic mass in female 04/25/2020   Ovarian cancer screening 04/25/2020   Ovarian mass, left 04/25/2020   Stroke (cerebrum) ([place]) 04/20/2020   Acute CVA (cerebrovascular accident) ([place]) 04/19/2020   CVA (cerebral vascular accident) ([place]) 04/19/2020   History of ischemic multifocal posterior circulation stroke 02/29/2020   Thrombocytopenia ([place]) 02/28/2020   HPV test positive 07/08/2015    Difficulty hearing 06/28/2015   Arthritis, degenerative 06/28/2015   Obesity, Class I, BMI 30-34.9 06/28/2015   Paresthesia 06/28/2015   Purpura, nonthrombopenic ([place]) 06/28/2015   Other specified hypothyroidism 08/09/2014   Migraine without aura and without status migrainosus, not intractable 09/19/2008   Moderate major depression ([place]) 09/07/2006   OP (osteoporosis) 09/07/2006    Janna Arch, PT, DPT  10/02/2020, 3:31 PM  [place] [facility] MAIN [facility] SERVICES [address] [place], [place], [address] Phone: [phone]   Fax:  [phone]  Name: Natalie [name] MRN: [id number] Date of Birth: May 24, 1950

## 2020-10-03 ENCOUNTER — Telehealth: Payer: Self-pay

## 2020-10-03 NOTE — Telephone Encounter (Signed)
Spoke with Ms. Sisney. Following up on her mychart messages on 09/26/20 regarding surgical clearances. A phone visit with Dr. Berline Lopes has been scheduled for 10/12/20 at 2:45pm. Patient is in agreement of date and time. Patient will call with questions or concerns.

## 2020-10-04 ENCOUNTER — Other Ambulatory Visit: Payer: Self-pay

## 2020-10-04 ENCOUNTER — Ambulatory Visit: Payer: Medicare Other | Attending: Family Medicine

## 2020-10-04 DIAGNOSIS — R262 Difficulty in walking, not elsewhere classified: Secondary | ICD-10-CM | POA: Diagnosis not present

## 2020-10-04 DIAGNOSIS — R269 Unspecified abnormalities of gait and mobility: Secondary | ICD-10-CM | POA: Diagnosis not present

## 2020-10-04 DIAGNOSIS — R2681 Unsteadiness on feet: Secondary | ICD-10-CM | POA: Insufficient documentation

## 2020-10-04 DIAGNOSIS — R2689 Other abnormalities of gait and mobility: Secondary | ICD-10-CM | POA: Diagnosis not present

## 2020-10-04 DIAGNOSIS — M6281 Muscle weakness (generalized): Secondary | ICD-10-CM | POA: Diagnosis not present

## 2020-10-04 DIAGNOSIS — R278 Other lack of coordination: Secondary | ICD-10-CM | POA: Insufficient documentation

## 2020-10-04 NOTE — Therapy (Signed)
[place] MAIN [facility] SERVICES [address] [place], [place], [address] Phone: [phone]   Fax:  [phone]  Physical Therapy Treatment  Patient Details  Name: Natalie [name] MRN: [id number] Date of Birth: 1950-06-14 Referring Provider (PT): [name], MD   Encounter Date: 10/04/2020   PT End of Session - 10/04/20 1639     Visit Number 18    Number of Visits 35    Date for PT Re-Evaluation 11/08/20    Authorization Type Traditional Medicare with [employer] Supplemental: VL based on certification    Authorization Time Period 08/14/20-11/08/20    PT Start Time 1605    PT Stop Time 1645    PT Time Calculation (min) 40 min    Equipment Utilized During Treatment Gait belt    Activity Tolerance Patient tolerated treatment well    Behavior During Therapy WFL for tasks assessed/performed             Past Medical History:  Diagnosis Date   Acute bronchospasm due to viral infection    Anemia    history of   CVA (cerebral vascular accident) ([place]) 02/28/2020   Depressive disorder    Epistaxis    Fatigue    Hearing loss    Hypothyroidism    Memory change    Migraine    Numbness and tingling    Obesity (BMI 30.0-34.9)    Osteoarthritis    Osteoporosis    Polyuria    Premature menopause    Seizures ([place])    history of mini seizures, possible migraine induced    Past Surgical History:  Procedure Laterality Date   ABLATION  2006   BUBBLE STUDY  04/23/2020   Procedure: BUBBLE STUDY;  Surgeon: Larey Dresser, MD;  Location: [place];  Service: Cardiovascular;;   CATARACT EXTRACTION W/ INTRAOCULAR LENS IMPLANT Bilateral    COLONOSCOPY     COLONOSCOPY WITH PROPOFOL N/A 12/22/2018   Procedure: COLONOSCOPY WITH PROPOFOL;  Surgeon: Virgel Manifold, MD;  Location: [facility] ENDOSCOPY;  Service: Endoscopy;  Laterality: N/A;   EYE SURGERY  Spring 2018   Cataracts both eyes   LOOP RECORDER INSERTION N/A 04/23/2020   Procedure:  LOOP RECORDER INSERTION;  Surgeon: Evans Lance, MD;  Location: [place] CV LAB;  Service: Cardiovascular;  Laterality: N/A;   TEE WITHOUT CARDIOVERSION N/A 04/23/2020   Procedure: TRANSESOPHAGEAL ECHOCARDIOGRAM (TEE);  Surgeon: Larey Dresser, MD;  Location: [facility] ENDOSCOPY;  Service: Cardiovascular;  Laterality: N/A;   TONSILLECTOMY      There were no vitals filed for this visit.   Subjective Assessment - 10/04/20 1626     Subjective Patient reports she stopped her medication for her bleeding and now has started bleeding. Feels frustrated.    Pertinent History Pt confirms the following hx is accurate (initially from acute care PT eval): "the pt is a 70 yo female presenting 3/17 with confusion and difficulty word-finding. Imaging revealed L MCA infarcts with nearly occlusive thrombus in proximal M2 and M3." PMH includes the following: CVA in Jan 2022 (for further details see ACUTE PT eval in EMR from 02/29/2020), anxiety, hypothyroidism, depression, OA, osteoporosis, memory change, migraine, seizures, hearing loss.    Limitations Standing;Walking;Lifting;House hold activities;Writing    How long can you stand comfortably? Pt reports she doesn't like to stand still for very long    How long can you walk comfortably? Difficulty with balance when walking over uneven ground, but states she is able to walk a  mile. She says sometimes she gets tired walking and will sit down. She feels overall she's been fairly energetic.    Diagnostic tests per chart: "[facility] 04/19/20: MR BRAIN IMPRESSION:  1. Patchy small volume acute ischemic nonhemorrhagic posterior left  MCA distribution infarcts, likely reflecting an embolic shower. No  associated hemorrhage or mass effect.  2. No other acute intracranial abnormality.  3. Underlying mild chronic microvascular ischemic disease.; 3/17 CT ANGIO HEAD NECK:  IMPRESSION:  Nearly occlusive thrombus at the bifurcation of a proximal M2 MCA  branch approximately 6 mm from the  MCA bifurcation. Subsequent  occlusion of a proximal left M3 MCA branch. No stenosis in the neck."    Patient Stated Goals Pt says she wants to improve her balance and does not want to fall again    Currently in Pain? No/denies                 TREATMENT  Neuro Re-Ed Standing with CGA next to support surface:   Half foam roller df/pf 20x Half foam roller: modified tandem stance 2x30 second holds   Airex pad: horizontal head turns 30 seconds scanning room 10x ; cueing for arc of motion  Airex pad: vertical head turns 30 seconds, cueing for arc of motion, noticeable sway with upward gaze increasing demand on ankle righting reaction musculature Airex pad: one foot on 6" step one foot on airex pad, hold position for 30 seconds, switch legs, 2x each LE; Airex pad: 6" step airex pad lateral stepping up/down 10x each side; UE support when required   Korebalance ; penguin game for weight shift forward/backwards, lateral L and R x 4 minutes.    6" step crane step up 10x each LE; SUE support       TherEx: 10x STS with arms crossed Modified posterior lunge 10x each LE; BUE support ; very challenging for bilateral knee flexion.    Seated:   Sit to stand 10x;  RTB around bilateral feet alternating LAQ 15x each LE ; 3 second holds RTB around bilateral LE: alternating ER/IR 12x each LE    Pt requires CGA-min a throughout and intermittent UE support to maintain  balance for majority of exercises.    Patient presents to physical therapy with good motivation. She is challenged with modified lunges with poor bilateral knee flexion. Ankle righting reactions are improving. Patient reports she is not fatigued by end of session but is sweating.  Pt will benefit from further skilled PT to continue to improve strength, gait and balance to decrease fall risk                  PT Education - 10/04/20 1639     Education Details exercise technique, body mechanics    Person(s)  Educated Patient    Methods Explanation;Tactile cues;Demonstration;Verbal cues    Comprehension Verbalized understanding;Returned demonstration;Verbal cues required;Tactile cues required              PT Short Term Goals - 08/23/20 1651       PT SHORT TERM GOAL #1   Title Pt will be independent with HEP in order to improve strength and balance in order to decrease fall risk and improve function at home and work.    Baseline 4/18: to be initiated 7/6: HEP compliant, new HEP given    Time 6    Period Weeks    Status Partially Met    Target Date 10/04/20  PT Long Term Goals - 08/23/20 1709       PT LONG TERM GOAL #1   Title Pt will improve DGI by at least 3 points in order to demonstrate clinically significant improvement in balance and decreased risk for falls.    Baseline 4/18: 18/24; 7/12: unstable BP, defer to next visit    Time 12    Period Weeks    Status New    Target Date 11/08/20      PT LONG TERM GOAL #2   Title Pt will improve ABC scale by at least 13% in order to demonstrate clinically significant improvement in balance confidence.    Baseline 4/18: 63.75% 7/6: 74%    Time 12    Period Weeks    Status On-going    Target Date 11/08/20      PT LONG TERM GOAL #3   Title Pt will demonstrate improved ability to maintain balance on compliant surface with EC for at least 5 seconds to indicate improved ability to navigate uneven surfaces and ambulate in dimly lit rooms.    Baseline 4/18: Pt unable to maintain balance with EC on foam on CTSIB-M 7/6: met    Time 12    Period Weeks    Status Achieved      PT LONG TERM GOAL #4   Title Patient will increase BLE gross strength to 4+/5 as to improve functional strength for independent gait, increased standing tolerance and increased ADL ability.    Baseline 4/18: gross BLE strength currnelty 4/5 7/6: hips 4+/5 ankles 4/5    Time 12    Period Weeks    Status On-going    Target Date 11/08/20      PT  LONG TERM GOAL #5   Title Patient will increase FOTO score to equal to or greater than 80 to demonstrate improvement in mobility and quality of life.    Baseline 4/18: 74% 7/6: 59%; 7/21: 65    Time 12    Period Weeks    Status On-going    Target Date 11/08/20      PT LONG TERM GOAL #6   Title Patient will reduce dizziness handicap inventory score to <10, for less dizziness with ADLs and increased safety with home and work tasks.    Baseline 4/27: DHI score is 16, indicating mild handicap 7/6: 22%    Time 11    Period Weeks    Status On-going    Target Date 11/08/20                   Plan - 10/04/20 1647     Clinical Impression Statement Patient presents to physical therapy with good motivation. She is challenged with modified lunges with poor bilateral knee flexion. Ankle righting reactions are improving. Patient reports she is not fatigued by end of session but is sweating.  Pt will benefit from further skilled PT to continue to improve strength, gait and balance to decrease fall risk    Personal Factors and Comorbidities Age;Comorbidity 1;Comorbidity 2;Past/Current Experience;Sex;Social Background;Behavior Pattern;Comorbidity 3+    Comorbidities CVA in Jan 2022, anxiety, hypothyroidism, depression, OA, osteoporosis, memory change, migraine, seizures, hearing loss.    Examination-Activity Limitations Bathing;Stairs;Stand;Hygiene/Grooming;Locomotion Level;Carry;Bed Mobility;Other;Reach Overhead;Lift    Examination-Participation Restrictions Community Activity;Yard Work;Shop;Cleaning;Laundry;Medication Management;Meal Prep    Stability/Clinical Decision Making Evolving/Moderate complexity    Rehab Potential Good    PT Frequency 2x / week    PT Duration 12 weeks    PT Treatment/Interventions ADLs/Self Care Home  Management;Biofeedback;Canalith Repostioning;Cryotherapy;Electrical Stimulation;Aquatic Therapy;Moist Heat;Ultrasound;DME Instruction;Gait training;Stair  training;Functional mobility training;Therapeutic activities;Therapeutic exercise;Balance training;Neuromuscular re-education;Cognitive remediation;Patient/family education;Orthotic Fit/Training;Manual techniques;Passive range of motion;Energy conservation;Taping;Splinting;Vestibular;Visual/perceptual remediation/compensation;Joint Manipulations    PT Next Visit Plan dynamic balance tasks with head turns, balance with EC    PT Home Exercise Plan Access Code: [email or web address], added:  Access Code: [email or web address], no updates    Consulted and Agree with Plan of Care Patient             Patient will benefit from skilled therapeutic intervention in order to improve the following deficits and impairments:  Abnormal gait, Decreased balance, Decreased endurance, Difficulty walking, Decreased range of motion, Dizziness, Improper body mechanics, Impaired vision/preception, Decreased coordination, Decreased strength, Decreased mobility, Obesity, Decreased activity tolerance, Decreased knowledge of use of DME, Impaired flexibility, Postural dysfunction  Visit Diagnosis: Unsteadiness on feet  Other abnormalities of gait and mobility  Muscle weakness (generalized)     Problem List Patient Active Problem List   Diagnosis Date Noted   Thickened endometrium 08/13/2020   Acute ITP ([place]) 05/02/2020   Other intra-abdominal and pelvic swelling, mass and lump 04/26/2020   Pelvic mass in female 04/25/2020   Ovarian cancer screening 04/25/2020   Ovarian mass, left 04/25/2020   Stroke (cerebrum) ([place]) 04/20/2020   Acute CVA (cerebrovascular accident) ([place]) 04/19/2020   CVA (cerebral vascular accident) ([place]) 04/19/2020   History of ischemic multifocal posterior circulation stroke 02/29/2020   Thrombocytopenia ([place]) 02/28/2020   HPV test positive 07/08/2015   Difficulty hearing 06/28/2015   Arthritis, degenerative 06/28/2015   Obesity, Class I, BMI 30-34.9 06/28/2015   Paresthesia 06/28/2015   Purpura,  nonthrombopenic ([place]) 06/28/2015   Other specified hypothyroidism 08/09/2014   Migraine without aura and without status migrainosus, not intractable 09/19/2008   Moderate major depression ([place]) 09/07/2006   OP (osteoporosis) 09/07/2006    Janna Arch, PT, DPT  10/04/2020, 4:48 PM  [facility] MAIN [facility] SERVICES [address] [place], [place], [address] Phone: [phone]   Fax:  [phone]  Name: TYAIRA HEWARD MRN: [id number] Date of Birth: 04/17/1950

## 2020-10-05 ENCOUNTER — Telehealth: Payer: Self-pay | Admitting: *Deleted

## 2020-10-05 ENCOUNTER — Inpatient Hospital Stay: Payer: Medicare Other | Attending: Hematology and Oncology | Admitting: Gynecologic Oncology

## 2020-10-05 ENCOUNTER — Encounter: Payer: Self-pay | Admitting: Gynecologic Oncology

## 2020-10-05 DIAGNOSIS — N838 Other noninflammatory disorders of ovary, fallopian tube and broad ligament: Secondary | ICD-10-CM

## 2020-10-05 DIAGNOSIS — R9389 Abnormal findings on diagnostic imaging of other specified body structures: Secondary | ICD-10-CM | POA: Diagnosis not present

## 2020-10-05 DIAGNOSIS — N939 Abnormal uterine and vaginal bleeding, unspecified: Secondary | ICD-10-CM | POA: Diagnosis not present

## 2020-10-05 NOTE — H&P (View-Only) (Signed)
Gynecologic Oncology Telehealth Consult Note: Gyn-Onc  I connected with Natalie [name] on 10/05/20 at  1:45 PM EDT by telephone and verified that I am speaking with the correct person using two identifiers.  I discussed the limitations, risks, security and privacy concerns of performing an evaluation and management service by telemedicine and the availability of in-person appointments. I also discussed with the patient that there may be a patient responsible charge related to this service. The patient expressed understanding and agreed to proceed.  Other persons participating in the visit and their role in the encounter: none.  Patient's location: home  Reason for Visit: follow-up re scheduling surgery  Treatment History: CT A/P in 04/2020 showed enlarged fibroid uterus; complex cystic mass in left ovary measuring up to 5cm. Pelvic ultrasound 05/01/20: Enlarged multi-fibroid uterus, endometrium 3.78m. Left ovary measures up to 7cm with a 5.8 x 4.1 x 4cm dominant septated cyst.  CA-125 04/2020: 592 08/2020: 731  Interval History: Patient had significant bleeding after endometrial biopsy on 7/25 (minute fragments of benign inactive endometrium, mostly blood/fibrin). Bleeding slowed significantly after starting Provera '10mg'$ . She then decreased to '5mg'$  and took her last pill 2 days ago. She describes bleeding as light watery spotting. Since last provera dose, bleeding has become a little heavier. It comes in spurts (she will have a 4 hour period of no bleeding and then feel bleeding start). Yesterday, had passage of a clot.   Past Medical/Surgical History: Past Medical History:  Diagnosis Date   Acute bronchospasm due to viral infection    Anemia    history of   [place]   Depressive disorder    Epistaxis    Fatigue    Hearing loss    Hypothyroidism    Memory change    Migraine    Numbness and tingling    Obesity (BMI 30.0-34.9)     Osteoarthritis    Osteoporosis    Polyuria    Premature menopause    [place]    history of mini seizures, possible migraine induced    Past Surgical History:  Procedure Laterality Date   ABLATION  2006   BUBBLE STUDY  04/23/2020   Procedure: BUBBLE STUDY;  Surgeon: MLarey Dresser MD;  [place]  Service: Cardiovascular;;   CATARACT EXTRACTION W/ INTRAOCULAR LENS IMPLANT Bilateral    COLONOSCOPY     COLONOSCOPY WITH PROPOFOL N/A 12/22/2018   Procedure: COLONOSCOPY WITH PROPOFOL;  Surgeon: TVirgel Manifold MD;  Location: [facility] ENDOSCOPY;  Service: Endoscopy;  Laterality: N/A;   EYE SURGERY  Spring 2018   Cataracts both eyes   LOOP RECORDER INSERTION N/A 04/23/2020   Procedure: LOOP RECORDER INSERTION;  Surgeon: TEvans Lance MD;  [facility];  Service: Cardiovascular;  Laterality: N/A;   TEE WITHOUT CARDIOVERSION N/A 04/23/2020   Procedure: TRANSESOPHAGEAL ECHOCARDIOGRAM (TEE);  Surgeon: MLarey Dresser MD;  [facility];  Service: Cardiovascular;  Laterality: N/A;   TONSILLECTOMY      Family History  Problem Relation Age of Onset   Early death Father        suicide   Depression Father    Diabetes Father    Cancer Father        prostate   Hearing loss Father        due to war   Alzheimer's disease Mother    Heart disease Brother    Atrial fibrillation Brother    Heart disease Maternal  Aunt    Dementia Maternal Aunt    Heart disease Maternal Uncle    Dementia Maternal Grandmother    Heart disease Brother    Atrial fibrillation Brother    Colon cancer Neg Hx    Breast cancer Neg Hx    Ovarian cancer Neg Hx    Pancreatic cancer Neg Hx    Endometrial cancer Neg Hx     Social History   Socioeconomic History   Marital status: Single    Spouse name: Not on file   Number of children: 1   Years of education: Not on file   Highest education level: Bachelor's degree (e.g., BA, AB, BS)  Occupational History    Occupation: retired   Tobacco Use   Smoking status: Former    Packs/day: 0.50    Years: 6.00    Pack years: 3.00    Types: Cigarettes    Quit date: 02/11/1974    Years since quitting: 46.6   Smokeless tobacco: Never  Vaping Use   Vaping Use: Never used  Substance and Sexual Activity   Alcohol use: Yes    Alcohol/week: 0.0 standard drinks    Comment: 2-3 drinks monthly   Drug use: Never   Sexual activity: Not Currently    Comment: Don't use not sexually active  Other Topics Concern   Not on file  Social History Narrative   Raised an adopted child on her own   Working part time reviewed documents   Social Determinants of Radio broadcast assistant Strain: Low Risk    Difficulty of Paying Living Expenses: Not hard at all  Food Insecurity: No Food Insecurity   Worried About Charity fundraiser in the Last Year: Never true   Arboriculturist in the Last Year: Never true  Transportation Needs: No Transportation Needs   Lack of Transportation (Medical): No   Lack of Transportation (Non-Medical): No  Physical Activity: Inactive   Days of Exercise per Week: 0 days   Minutes of Exercise per Session: 0 min  Stress: No Stress Concern Present   Feeling of Stress : Not at all  Social Connections: Moderately Integrated   Frequency of Communication with Friends and Family: More than three times a week   Frequency of Social Gatherings with Friends and Family: Twice a week   Attends Religious Services: More than 4 times per year   Active Member of [employer] or Organizations: Yes   Attends Music therapist: More than 4 times per year   Marital Status: Never married    Current Medications:  Current Outpatient Medications:    atorvastatin (LIPITOR) 40 MG tablet, Take 1 tablet (40 mg total) by mouth daily., Disp: 90 tablet, Rfl: 1   buPROPion (WELLBUTRIN SR) 150 MG 12 hr tablet, TAKE 2 TABLETS BY MOUTH EVERY DAY, Disp: 180 tablet, Rfl: 1   citalopram (CELEXA) 40 MG tablet, TAKE  1 TABLET BY MOUTH EVERY DAY, Disp: 90 tablet, Rfl: 1   clindamycin-benzoyl peroxide (BENZACLIN) gel, Apply topically 2 (two) times daily. (Patient taking differently: Apply 1 application topically 2 (two) times daily as needed (facial acne).), Disp: 50 g, Rfl: 0   clopidogrel (PLAVIX) 75 MG tablet, Take 1 tablet (75 mg total) by mouth daily., Disp: 30 tablet, Rfl: 1   Cyanocobalamin (VITAMIN B-12) 1000 MCG SUBL, Place 1,000 mcg under the tongue at bedtime., Disp: , Rfl:    levothyroxine (SYNTHROID) 75 MCG tablet, Take 1 tablet (75 mcg total) by mouth  daily before breakfast., Disp: 90 tablet, Rfl: 1   medroxyPROGESTERone (PROVERA) 5 MG tablet, Take 1 tablet (5 mg total) by mouth daily. Take two tablets daily until bleeding shows or stops, then decrease to one a day, Disp: 30 tablet, Rfl: 0   Multiple Vitamin (MULTIVITAMIN WITH MINERALS) TABS tablet, Take 1 tablet by mouth daily., Disp: , Rfl:    polyvinyl alcohol (LIQUIFILM TEARS) 1.4 % ophthalmic solution, Place 1 drop into both eyes daily as needed for dry eyes., Disp: , Rfl:    rivaroxaban (XARELTO) 20 MG TABS tablet, Take 20 mg by mouth daily with supper., Disp: , Rfl:    topiramate (TOPAMAX) 25 MG tablet, Take 25 mg by mouth daily. , Disp: , Rfl:    Vitamin D, Ergocalciferol, (DRISDOL) 1.25 MG (50000 UNIT) CAPS capsule, TAKE 1 CAPSULE BY MOUTH EVERY 7 DAYS, Disp: 12 capsule, Rfl: 1  Review of Symptoms: Pertinent positives as per HPI.  Physical Exam: There were no vitals taken for this visit. Deferred given limitations of phone visit  Laboratory & Radiologic Studies: Pelvic ultrasound 08/10/20: Thickened heterogeneous endometrial complex with somewhat nodular margins, associated with a 3.8 cm diameter fluid collection within the endometrial canal likely blood products, cannot exclude cervical stenosis or endometrial malignancy; tissue diagnosis recommended.   8.2 cm diameter cyst of the LEFT ovary; consider GYN consult and followup US in  3-6 months, or pelvis MRI w/o and w/ contrast for improved characterization. Note: This recommendation does not apply to premenarchal patients or to those with increased risk (genetic, family history, elevated tumor markers or other high-risk factors) of ovarian cancer. Reference: Radiology 2019 Nov; 293(2):359-371.  Assessment & Plan: Natalie [name] is a 70 y.o. woman with adnexal cyst and thickened endometrium in the setting of multiple comorbidities.  The patient and I had a long discussion today about possible interventions (both surgical and continued close imaging surveillance). She is well aware of her increased surgical risk in the setting of her comorbidities and recent strokes. While she has been cleared for surgery, she is at moderate-high risk.  In reviewing her most recent imaging, her adnexal cyst, while it has grown since the ultrasound in March, is simple in Scientist, research (physical sciences). It do not appreciate features of the cyst that raise my concern for a malignant process. I do worry, though, that the biopsy she had in the clinic was inadequate (very little tissue in the sample). Given the appearance of her endometrium and the nodular mass within the cavity, I think that further sampling is warranted to rule out hyperplasia or malignancy.   The least invasive, least risky procedure would be to proceed with a hysteroscopy and D&C. If pathology from this returns benign, I think that the adnexal mass can be watched with imaging surveillance until she is further removed from her last stroke and at less risk from a surgical standpoint. If the biopsy were to show hyperplasia or malignancy, then we would decide whether to proceed with definitive surgery or some alternative treatment plan.  We also discussed the option of proceeding now with limited but major surgery to include robotic total hysterectomy and BSO with plan not to send frozen section to limit anesthesia time.   After discussing risks and  benefits of each option, the patient would like to proceed with outpatient surgery with a hysteroscopy and D&C. Will plan to scheduled surgery after mid-September (6 month mark from last stroke). If pathology is benign, we will repeat a pelvic ultrasound after to reassess  the adnexal cyst after surgery.   [name] preference at this time is not to restart provera for vaginal bleeding. She will call if bleeding increases between now and surgery.  I discussed the assessment and treatment plan with the patient. The patient was provided with an opportunity to ask questions and all were answered. The patient agreed with the plan and demonstrated an understanding of the instructions.   The patient was advised to call back or see an in-person evaluation if the symptoms worsen or if the condition fails to improve as anticipated.   50 minutes of total time was spent for this patient encounter, including preparation, face-to-face counseling with the patient and coordination of care, and documentation of the encounter.   Jeral Pinch, MD  Division of Gynecologic Oncology  Department of Obstetrics and Gynecology  [facility]

## 2020-10-05 NOTE — Telephone Encounter (Signed)
Per Melissa APP moved the patient from next week to today

## 2020-10-05 NOTE — Progress Notes (Signed)
Gynecologic Oncology Telehealth Consult Note: Gyn-Onc  I connected with Natalie [name] on 10/05/20 at  1:45 PM EDT by telephone and verified that I am speaking with the correct person using two identifiers.  I discussed the limitations, risks, security and privacy concerns of performing an evaluation and management service by telemedicine and the availability of in-person appointments. I also discussed with the patient that there may be a patient responsible charge related to this service. The patient expressed understanding and agreed to proceed.  Other persons participating in the visit and their role in the encounter: none.  Patient's location: home  Reason for Visit: follow-up re scheduling surgery  Treatment History: CT A/P in 04/2020 showed enlarged fibroid uterus; complex cystic mass in left ovary measuring up to 5cm. Pelvic ultrasound 05/01/20: Enlarged multi-fibroid uterus, endometrium 3.39m. Left ovary measures up to 7cm with a 5.8 x 4.1 x 4cm dominant septated cyst.  CA-125 04/2020: 592 08/2020: 731  Interval History: Patient had significant bleeding after endometrial biopsy on 7/25 (minute fragments of benign inactive endometrium, mostly blood/fibrin). Bleeding slowed significantly after starting Provera '10mg'$ . She then decreased to '5mg'$  and took her last pill 2 days ago. She describes bleeding as light watery spotting. Since last provera dose, bleeding has become a little heavier. It comes in spurts (she will have a 4 hour period of no bleeding and then feel bleeding start). Yesterday, had passage of a clot.   Past Medical/Surgical History: Past Medical History:  Diagnosis Date   Acute bronchospasm due to viral infection    Anemia    history of   [place]   Depressive disorder    Epistaxis    Fatigue    Hearing loss    Hypothyroidism    Memory change    Migraine    Numbness and tingling    Obesity (BMI 30.0-34.9)     Osteoarthritis    Osteoporosis    Polyuria    Premature menopause    [place] [place]    history of mini seizures, possible migraine induced    Past Surgical History:  Procedure Laterality Date   ABLATION  2006   BUBBLE STUDY  04/23/2020   Procedure: BUBBLE STUDY;  Surgeon: MLarey Dresser MD;  [facility]  Service: Cardiovascular;;   CATARACT EXTRACTION W/ INTRAOCULAR LENS IMPLANT Bilateral    COLONOSCOPY     COLONOSCOPY WITH PROPOFOL N/A 12/22/2018   Procedure: COLONOSCOPY WITH PROPOFOL;  Surgeon: TVirgel Manifold MD;  Location: [facility] ENDOSCOPY;  Service: Endoscopy;  Laterality: N/A;   EYE SURGERY  Spring 2018   Cataracts both eyes   LOOP RECORDER INSERTION N/A 04/23/2020   Procedure: LOOP RECORDER INSERTION;  Surgeon: TEvans Lance MD;  [facility];  Service: Cardiovascular;  Laterality: N/A;   TEE WITHOUT CARDIOVERSION N/A 04/23/2020   Procedure: TRANSESOPHAGEAL ECHOCARDIOGRAM (TEE);  Surgeon: MLarey Dresser MD;  [facility];  Service: Cardiovascular;  Laterality: N/A;   TONSILLECTOMY      Family History  Problem Relation Age of Onset   Early death Father        suicide   Depression Father    Diabetes Father    Cancer Father        prostate   Hearing loss Father        due to war   Alzheimer's disease Mother    Heart disease Brother    Atrial fibrillation Brother    Heart disease Maternal  Aunt    Dementia Maternal Aunt    Heart disease Maternal Uncle    Dementia Maternal Grandmother    Heart disease Brother    Atrial fibrillation Brother    Colon cancer Neg Hx    Breast cancer Neg Hx    Ovarian cancer Neg Hx    Pancreatic cancer Neg Hx    Endometrial cancer Neg Hx     Social History   Socioeconomic History   Marital status: Single    Spouse name: Not on file   Number of children: 1   Years of education: Not on file   Highest education level: Bachelor's degree (e.g., BA, AB, BS)  Occupational History    Occupation: retired   Tobacco Use   Smoking status: Former    Packs/day: 0.50    Years: 6.00    Pack years: 3.00    Types: Cigarettes    Quit date: 02/11/1974    Years since quitting: 46.6   Smokeless tobacco: Never  Vaping Use   Vaping Use: Never used  Substance and Sexual Activity   Alcohol use: Yes    Alcohol/week: 0.0 standard drinks    Comment: 2-3 drinks monthly   Drug use: Never   Sexual activity: Not Currently    Comment: Don't use not sexually active  Other Topics Concern   Not on file  Social History Narrative   Raised an adopted child on her own   Working part time reviewed documents   Social Determinants of Radio broadcast assistant Strain: Low Risk    Difficulty of Paying Living Expenses: Not hard at all  Food Insecurity: No Food Insecurity   Worried About Charity fundraiser in the Last Year: Never true   Arboriculturist in the Last Year: Never true  Transportation Needs: No Transportation Needs   Lack of Transportation (Medical): No   Lack of Transportation (Non-Medical): No  Physical Activity: Inactive   Days of Exercise per Week: 0 days   Minutes of Exercise per Session: 0 min  Stress: No Stress Concern Present   Feeling of Stress : Not at all  Social Connections: Moderately Integrated   Frequency of Communication with Friends and Family: More than three times a week   Frequency of Social Gatherings with Friends and Family: Twice a week   Attends Religious Services: More than 4 times per year   Active Member of [employer] or Organizations: Yes   Attends Music therapist: More than 4 times per year   Marital Status: Never married    Current Medications:  Current Outpatient Medications:    atorvastatin (LIPITOR) 40 MG tablet, Take 1 tablet (40 mg total) by mouth daily., Disp: 90 tablet, Rfl: 1   buPROPion (WELLBUTRIN SR) 150 MG 12 hr tablet, TAKE 2 TABLETS BY MOUTH EVERY DAY, Disp: 180 tablet, Rfl: 1   citalopram (CELEXA) 40 MG tablet, TAKE  1 TABLET BY MOUTH EVERY DAY, Disp: 90 tablet, Rfl: 1   clindamycin-benzoyl peroxide (BENZACLIN) gel, Apply topically 2 (two) times daily. (Patient taking differently: Apply 1 application topically 2 (two) times daily as needed (facial acne).), Disp: 50 g, Rfl: 0   clopidogrel (PLAVIX) 75 MG tablet, Take 1 tablet (75 mg total) by mouth daily., Disp: 30 tablet, Rfl: 1   Cyanocobalamin (VITAMIN B-12) 1000 MCG SUBL, Place 1,000 mcg under the tongue at bedtime., Disp: , Rfl:    levothyroxine (SYNTHROID) 75 MCG tablet, Take 1 tablet (75 mcg total) by mouth  daily before breakfast., Disp: 90 tablet, Rfl: 1   medroxyPROGESTERone (PROVERA) 5 MG tablet, Take 1 tablet (5 mg total) by mouth daily. Take two tablets daily until bleeding shows or stops, then decrease to one a day, Disp: 30 tablet, Rfl: 0   Multiple Vitamin (MULTIVITAMIN WITH MINERALS) TABS tablet, Take 1 tablet by mouth daily., Disp: , Rfl:    polyvinyl alcohol (LIQUIFILM TEARS) 1.4 % ophthalmic solution, Place 1 drop into both eyes daily as needed for dry eyes., Disp: , Rfl:    rivaroxaban (XARELTO) 20 MG TABS tablet, Take 20 mg by mouth daily with supper., Disp: , Rfl:    topiramate (TOPAMAX) 25 MG tablet, Take 25 mg by mouth daily. , Disp: , Rfl:    Vitamin D, Ergocalciferol, (DRISDOL) 1.25 MG (50000 UNIT) CAPS capsule, TAKE 1 CAPSULE BY MOUTH EVERY 7 DAYS, Disp: 12 capsule, Rfl: 1  Review of Symptoms: Pertinent positives as per HPI.  Physical Exam: There were no vitals taken for this visit. Deferred given limitations of phone visit  Laboratory & Radiologic Studies: Pelvic ultrasound 08/10/20: Thickened heterogeneous endometrial complex with somewhat nodular margins, associated with a 3.8 cm diameter fluid collection within the endometrial canal likely blood products, cannot exclude cervical stenosis or endometrial malignancy; tissue diagnosis recommended.   8.2 cm diameter cyst of the LEFT ovary; consider GYN consult and followup US in  3-6 months, or pelvis MRI w/o and w/ contrast for improved characterization. Note: This recommendation does not apply to premenarchal patients or to those with increased risk (genetic, family history, elevated tumor markers or other high-risk factors) of ovarian cancer. Reference: Radiology 2019 Nov; 293(2):359-371.  Assessment & Plan: Natalie [name] is a 70 y.o. woman with adnexal cyst and thickened endometrium in the setting of multiple comorbidities.  The patient and I had a long discussion today about possible interventions (both surgical and continued close imaging surveillance). She is well aware of her increased surgical risk in the setting of her comorbidities and recent strokes. While she has been cleared for surgery, she is at moderate-high risk.  In reviewing her most recent imaging, her adnexal cyst, while it has grown since the ultrasound in March, is simple in Scientist, research (physical sciences). It do not appreciate features of the cyst that raise my concern for a malignant process. I do worry, though, that the biopsy she had in the clinic was inadequate (very little tissue in the sample). Given the appearance of her endometrium and the nodular mass within the cavity, I think that further sampling is warranted to rule out hyperplasia or malignancy.   The least invasive, least risky procedure would be to proceed with a hysteroscopy and D&C. If pathology from this returns benign, I think that the adnexal mass can be watched with imaging surveillance until she is further removed from her last stroke and at less risk from a surgical standpoint. If the biopsy were to show hyperplasia or malignancy, then we would decide whether to proceed with definitive surgery or some alternative treatment plan.  We also discussed the option of proceeding now with limited but major surgery to include robotic total hysterectomy and BSO with plan not to send frozen section to limit anesthesia time.   After discussing risks and  benefits of each option, the patient would like to proceed with outpatient surgery with a hysteroscopy and D&C. Will plan to scheduled surgery after mid-September (6 month mark from last stroke). If pathology is benign, we will repeat a pelvic ultrasound after to reassess  the adnexal cyst after surgery.   [name] preference at this time is not to restart provera for vaginal bleeding. She will call if bleeding increases between now and surgery.  I discussed the assessment and treatment plan with the patient. The patient was provided with an opportunity to ask questions and all were answered. The patient agreed with the plan and demonstrated an understanding of the instructions.   The patient was advised to call back or see an in-person evaluation if the symptoms worsen or if the condition fails to improve as anticipated.   50 minutes of total time was spent for this patient encounter, including preparation, face-to-face counseling with the patient and coordination of care, and documentation of the encounter.   Jeral Pinch, MD  Division of Gynecologic Oncology  Department of Obstetrics and Gynecology  [facility] [place]

## 2020-10-10 ENCOUNTER — Other Ambulatory Visit: Payer: Self-pay

## 2020-10-10 ENCOUNTER — Telehealth: Payer: Self-pay | Admitting: *Deleted

## 2020-10-10 ENCOUNTER — Ambulatory Visit: Payer: Medicare Other

## 2020-10-10 DIAGNOSIS — R269 Unspecified abnormalities of gait and mobility: Secondary | ICD-10-CM | POA: Diagnosis not present

## 2020-10-10 DIAGNOSIS — R2681 Unsteadiness on feet: Secondary | ICD-10-CM | POA: Diagnosis not present

## 2020-10-10 DIAGNOSIS — F330 Major depressive disorder, recurrent, mild: Secondary | ICD-10-CM | POA: Diagnosis not present

## 2020-10-10 DIAGNOSIS — R278 Other lack of coordination: Secondary | ICD-10-CM

## 2020-10-10 DIAGNOSIS — R2689 Other abnormalities of gait and mobility: Secondary | ICD-10-CM | POA: Diagnosis not present

## 2020-10-10 DIAGNOSIS — M6281 Muscle weakness (generalized): Secondary | ICD-10-CM | POA: Diagnosis not present

## 2020-10-10 DIAGNOSIS — R262 Difficulty in walking, not elsewhere classified: Secondary | ICD-10-CM

## 2020-10-10 NOTE — Therapy (Signed)
[place] MAIN [facility] SERVICES [address] [place], [place], [address] Phone: [phone]   Fax:  [phone]  Physical Therapy Treatment  Patient Details  Name: Natalie [name] MRN: [id number] Date of Birth: 1950/11/14 Referring Provider (PT): [name], MD   Encounter Date: 10/10/2020   PT End of Session - 10/10/20 1311     Visit Number 19    Number of Visits 35    Date for PT Re-Evaluation 11/08/20    Authorization Type Traditional Medicare with [employer] Supplemental: VL based on certification    Authorization Time Period 08/14/20-11/08/20    PT Start Time 1300    PT Stop Time 1345    PT Time Calculation (min) 45 min    Equipment Utilized During Treatment Gait belt    Activity Tolerance Patient tolerated treatment well    Behavior During Therapy [facility] for tasks assessed/performed             Past Medical History:  Diagnosis Date   Acute bronchospasm due to viral infection    Anemia    history of   CVA (cerebral vascular accident) ([place]) 02/28/2020   Depressive disorder    Epistaxis    Fatigue    Hearing loss    Hypothyroidism    Memory change    Migraine    Numbness and tingling    Obesity (BMI 30.0-34.9)    Osteoarthritis    Osteoporosis    Polyuria    Premature menopause    Seizures ([place])    history of mini seizures, possible migraine induced    Past Surgical History:  Procedure Laterality Date   ABLATION  2006   BUBBLE STUDY  04/23/2020   Procedure: BUBBLE STUDY;  Surgeon: Larey Dresser, MD;  Location: [facility];  Service: Cardiovascular;;   CATARACT EXTRACTION W/ INTRAOCULAR LENS IMPLANT Bilateral    COLONOSCOPY     COLONOSCOPY WITH PROPOFOL N/A 12/22/2018   Procedure: COLONOSCOPY WITH PROPOFOL;  Surgeon: Virgel Manifold, MD;  Location: [facility] ENDOSCOPY;  Service: Endoscopy;  Laterality: N/A;   EYE SURGERY  Spring 2018   Cataracts both eyes   LOOP RECORDER INSERTION N/A 04/23/2020   Procedure:  LOOP RECORDER INSERTION;  Surgeon: Evans Lance, MD;  Location: [facility];  Service: Cardiovascular;  Laterality: N/A;   TEE WITHOUT CARDIOVERSION N/A 04/23/2020   Procedure: TRANSESOPHAGEAL ECHOCARDIOGRAM (TEE);  Surgeon: Larey Dresser, MD;  Location: [facility] ENDOSCOPY;  Service: Cardiovascular;  Laterality: N/A;   TONSILLECTOMY      There were no vitals filed for this visit.   Subjective Assessment - 10/10/20 1305     Subjective Patien treports she is doing well overall- Has surgery planned for later this month. States she has enjoyed working on harder level balance activities.    Pertinent History Pt confirms the following hx is accurate (initially from acute care PT eval): "the pt is a 70 yo female presenting 3/17 with confusion and difficulty word-finding. Imaging revealed L MCA infarcts with nearly occlusive thrombus in proximal M2 and M3." PMH includes the following: CVA in Jan 2022 (for further details see ACUTE PT eval in EMR from 02/29/2020), anxiety, hypothyroidism, depression, OA, osteoporosis, memory change, migraine, seizures, hearing loss.    Limitations Standing;Walking;Lifting;House hold activities;Writing    How long can you stand comfortably? Pt reports she doesn't like to stand still for very long    How long can you walk comfortably? Difficulty with balance when walking over uneven ground,  but states she is able to walk a mile. She says sometimes she gets tired walking and will sit down. She feels overall she's been fairly energetic.    Diagnostic tests per chart: "[place] 04/19/20: MR BRAIN IMPRESSION:  1. Patchy small volume acute ischemic nonhemorrhagic posterior left  MCA distribution infarcts, likely reflecting an embolic shower. No  associated hemorrhage or mass effect.  2. No other acute intracranial abnormality.  3. Underlying mild chronic microvascular ischemic disease.; 3/17 CT ANGIO HEAD NECK:  IMPRESSION:  Nearly occlusive thrombus at the bifurcation of a  proximal M2 MCA  branch approximately 6 mm from the MCA bifurcation. Subsequent  occlusion of a proximal left M3 MCA branch. No stenosis in the neck."    Patient Stated Goals Pt says she wants to improve her balance and does not want to fall again    Currently in Pain? No/denies              INTERVENTIONS:   Neuro-re education: (all exercises performed near support bar station and utilized blue airex pad, 1/2 white foam roll, BOSU ball)     Calf raises on 1/2 foam  Toe raises on 1/2 foam *initially with finger tip touch- Progressed to no UE support x 12 reps x 2 sets. No LOB- mild unsteadiness. Patient reports activity as "Easy."  Static standing on 1/2 foam - intially unsteady- Increased LOB requiring UE support- Progressed to no UE support with practice- able to hold up to 30 sec x 3 trials.   Side stepping on 1/2 foam- Min UE support down and back on long 1/2 foam x 10 reps each direction.   Static stand on BOSU (curved side up) x 3 trials- Initial Unsteadiness and UE support - able to progress to no UE support and hold for 30 sec each trial.   Dynamic Lunge squat (minimal) - starting with Both feet on Blue airex pad and lunge forward onto BOSU ball.  Initially with 1 UE support - progressed to hand just hovering over bar x 12 reps each LE.    BOSU- Static standing (flat side up) hold 30 sec without UE- with feet initially wide - hold 30 sec x 3. As she became acclimated her balance improved and she was able to progress to more narrowed stance on ball.  BOSU- Lateral weight shift (flat side up) x20-15 reps each side.  BOSU- A/P weight shift. (Flat side up) x 20 reps  Leaning forward toward wall- reaching with 1 UE while opp LE into post. Hip ext-(golfers lean) with min assist- Patient with more difficulty coordinating activity and attempted several trials each leg and progressed to being able to perform with only CGA and more control yet still unsteady overall.   TherEx: 10x  STS with arms crossed  Education provided throughout session via VC/TC and demonstration to facilitate movement at target joints and correct muscle activation for all testing and exercises performed.   Clinical Impression: Patient challenged again today with progressive higher level balance exercises and responsive to all VC and visual demonstration. She was initially unsteady with several activities today but improved with each exercise and able to improve with her coordination and confidence with all activities with practice. She was fatigued at end of session stating she enjoyed being challenged including the BOSU ball and felt like she was working hard at end of session. Pt will benefit from further skilled PT to continue to improve strength, gait and balance to decrease fall risk  Upper Extremity Functional Index Score :   /80   PT Education - 10/10/20 1354     Education Details Exercise technique    Person(s) Educated Patient    Methods Explanation;Demonstration;Tactile cues;Verbal cues    Comprehension Verbalized understanding;Returned demonstration;Verbal cues required;Tactile cues required;Need further instruction              PT Short Term Goals - 08/23/20 1651       PT SHORT TERM GOAL #1   Title Pt will be independent with HEP in order to improve strength and balance in order to decrease fall risk and improve function at home and work.    Baseline 4/18: to be initiated 7/6: HEP compliant, new HEP given    Time 6    Period Weeks    Status Partially Met    Target Date 10/04/20               PT Long Term Goals - 08/23/20 1709       PT LONG TERM GOAL #1   Title Pt will improve DGI by at least 3 points in order to demonstrate clinically significant improvement in balance and decreased risk for falls.    Baseline 4/18: 18/24; 7/12: unstable BP, defer to next visit    Time 12    Period Weeks    Status New    Target Date 11/08/20       PT LONG TERM GOAL #2   Title Pt will improve ABC scale by at least 13% in order to demonstrate clinically significant improvement in balance confidence.    Baseline 4/18: 63.75% 7/6: 74%    Time 12    Period Weeks    Status On-going    Target Date 11/08/20      PT LONG TERM GOAL #3   Title Pt will demonstrate improved ability to maintain balance on compliant surface with EC for at least 5 seconds to indicate improved ability to navigate uneven surfaces and ambulate in dimly lit rooms.    Baseline 4/18: Pt unable to maintain balance with EC on foam on CTSIB-M 7/6: met    Time 12    Period Weeks    Status Achieved      PT LONG TERM GOAL #4   Title Patient will increase BLE gross strength to 4+/5 as to improve functional strength for independent gait, increased standing tolerance and increased ADL ability.    Baseline 4/18: gross BLE strength currnelty 4/5 7/6: hips 4+/5 ankles 4/5    Time 12    Period Weeks    Status On-going    Target Date 11/08/20      PT LONG TERM GOAL #5   Title Patient will increase FOTO score to equal to or greater than 80 to demonstrate improvement in mobility and quality of life.    Baseline 4/18: 74% 7/6: 59%; 7/21: 65    Time 12    Period Weeks    Status On-going    Target Date 11/08/20      PT LONG TERM GOAL #6   Title Patient will reduce dizziness handicap inventory score to <10, for less dizziness with ADLs and increased safety with home and work tasks.    Baseline 4/27: DHI score is 16, indicating mild handicap 7/6: 22%    Time 11    Period Weeks    Status On-going    Target Date 11/08/20  Plan - 10/10/20 1311     Clinical Impression Statement Patient challenged again today with progressive higher level balance exercises and responsive to all VC and visual demonstration. She was initially unsteady with several activities today but improved with each exercise and able to improve with her coordination and confidence  with all activities with practice. She was fatigued at end of session stating she enjoyed being challenged including the BOSU ball and felt like she was working hard at end of session. Pt will benefit from further skilled PT to continue to improve strength, gait and balance to decrease fall risk    Personal Factors and Comorbidities Age;Comorbidity 1;Comorbidity 2;Past/Current Experience;Sex;Social Background;Behavior Pattern;Comorbidity 3+    Comorbidities CVA in Jan 2022, anxiety, hypothyroidism, depression, OA, osteoporosis, memory change, migraine, seizures, hearing loss.    Examination-Activity Limitations Bathing;Stairs;Stand;Hygiene/Grooming;Locomotion Level;Carry;Bed Mobility;Other;Reach Overhead;Lift    Examination-Participation Restrictions Community Activity;Yard Work;Shop;Cleaning;Laundry;Medication Management;Meal Prep    Stability/Clinical Decision Making Evolving/Moderate complexity    Rehab Potential Good    PT Frequency 2x / week    PT Duration 12 weeks    PT Treatment/Interventions ADLs/Self Care Home Management;Biofeedback;Canalith Repostioning;Cryotherapy;Electrical Stimulation;Aquatic Therapy;Moist Heat;Ultrasound;DME Instruction;Gait training;Stair training;Functional mobility training;Therapeutic activities;Therapeutic exercise;Balance training;Neuromuscular re-education;Cognitive remediation;Patient/family education;Orthotic Fit/Training;Manual techniques;Passive range of motion;Energy conservation;Taping;Splinting;Vestibular;Visual/perceptual remediation/compensation;Joint Manipulations    PT Next Visit Plan dynamic balance tasks with head turns, balance with EC    PT Home Exercise Plan Access Code: [email or web address], added:  Access Code: [email or web address], no updates    Consulted and Agree with Plan of Care Patient             Patient will benefit from skilled therapeutic intervention in order to improve the following deficits and impairments:  Abnormal gait, Decreased balance, Decreased  endurance, Difficulty walking, Decreased range of motion, Dizziness, Improper body mechanics, Impaired vision/preception, Decreased coordination, Decreased strength, Decreased mobility, Obesity, Decreased activity tolerance, Decreased knowledge of use of DME, Impaired flexibility, Postural dysfunction  Visit Diagnosis: Abnormality of gait and mobility  Difficulty in walking, not elsewhere classified  Muscle weakness (generalized)  Other lack of coordination     Problem List Patient Active Problem List   Diagnosis Date Noted   Thickened endometrium 08/13/2020   Acute ITP ([place]) 05/02/2020   Other intra-abdominal and pelvic swelling, mass and lump 04/26/2020   Pelvic mass in female 04/25/2020   Ovarian cancer screening 04/25/2020   Ovarian mass, left 04/25/2020   Stroke (cerebrum) ([place]) 04/20/2020   Acute CVA (cerebrovascular accident) ([place]) 04/19/2020   CVA (cerebral vascular accident) ([place]) 04/19/2020   History of ischemic multifocal posterior circulation stroke 02/29/2020   Thrombocytopenia ([place]) 02/28/2020   HPV test positive 07/08/2015   Difficulty hearing 06/28/2015   Arthritis, degenerative 06/28/2015   Obesity, Class I, BMI 30-34.9 06/28/2015   Paresthesia 06/28/2015   Purpura, nonthrombopenic ([place]) 06/28/2015   Other specified hypothyroidism 08/09/2014   Migraine without aura and without status migrainosus, not intractable 09/19/2008   Moderate major depression ([place]) 09/07/2006   OP (osteoporosis) 09/07/2006    Lewis Moccasin, PT 10/10/2020, 2:08 PM  [place] MAIN [facility] SERVICES [address] [place], [place], [address] Phone: [phone]   Fax:  [phone]  Name: JACQUETTE CANALES MRN: [id number] Date of Birth: 1950-02-15

## 2020-10-10 NOTE — Telephone Encounter (Signed)
Attempted to reach the patient to schedule a pre op appt with [facility] APP on 9/20, left a message to call the office back

## 2020-10-10 NOTE — Telephone Encounter (Signed)
Returned that patient's call and scheduled her pre op appt

## 2020-10-12 ENCOUNTER — Ambulatory Visit: Payer: Medicare Other | Admitting: Gynecologic Oncology

## 2020-10-12 DIAGNOSIS — I6340 Cerebral infarction due to embolism of unspecified cerebral artery: Secondary | ICD-10-CM | POA: Diagnosis not present

## 2020-10-16 ENCOUNTER — Other Ambulatory Visit: Payer: Self-pay

## 2020-10-16 ENCOUNTER — Ambulatory Visit: Payer: Medicare Other

## 2020-10-16 ENCOUNTER — Telehealth: Payer: Self-pay | Admitting: *Deleted

## 2020-10-16 DIAGNOSIS — M6281 Muscle weakness (generalized): Secondary | ICD-10-CM | POA: Diagnosis not present

## 2020-10-16 DIAGNOSIS — R262 Difficulty in walking, not elsewhere classified: Secondary | ICD-10-CM

## 2020-10-16 DIAGNOSIS — R269 Unspecified abnormalities of gait and mobility: Secondary | ICD-10-CM

## 2020-10-16 DIAGNOSIS — R2681 Unsteadiness on feet: Secondary | ICD-10-CM | POA: Diagnosis not present

## 2020-10-16 DIAGNOSIS — R278 Other lack of coordination: Secondary | ICD-10-CM | POA: Diagnosis not present

## 2020-10-16 DIAGNOSIS — R2689 Other abnormalities of gait and mobility: Secondary | ICD-10-CM | POA: Diagnosis not present

## 2020-10-16 NOTE — Telephone Encounter (Signed)
Called to discuss medications related to upcoming procedure on 9/29. Left message for Ms. Loman to call back

## 2020-10-16 NOTE — Therapy (Signed)
[place] MAIN [facility] SERVICES [address] [place], [place], [address] Phone: [phone]   Fax:  [phone]  Physical Therapy Treatment /Physical Therapy Progress Note   Dates of reporting period  08/08/20   to   10/16/20   Patient Details  Name: Natalie [name] MRN: [id number] Date of Birth: August 06, 1950 Referring Provider (PT): [name], MD   Encounter Date: 10/16/2020   PT End of Session - 10/16/20 1319     Visit Number 20    Number of Visits 35    Date for PT Re-Evaluation 11/08/20    Authorization Type Traditional Medicare with [employer] Supplemental: VL based on certification    Authorization Time Period 08/14/20-11/08/20    PT Start Time 1307    PT Stop Time 1345    PT Time Calculation (min) 38 min    Equipment Utilized During Treatment Gait belt    Activity Tolerance Patient tolerated treatment well    Behavior During Therapy WFL for tasks assessed/performed             Past Medical History:  Diagnosis Date   Acute bronchospasm due to viral infection    Anemia    history of   CVA (cerebral vascular accident) ([place]) 02/28/2020   Depressive disorder    Epistaxis    Fatigue    Hearing loss    Hypothyroidism    Memory change    Migraine    Numbness and tingling    Obesity (BMI 30.0-34.9)    Osteoarthritis    Osteoporosis    Polyuria    Premature menopause    Seizures ([place])    history of mini seizures, possible migraine induced    Past Surgical History:  Procedure Laterality Date   ABLATION  2006   BUBBLE STUDY  04/23/2020   Procedure: BUBBLE STUDY;  Surgeon: Larey Dresser, MD;  Location: [facility];  Service: Cardiovascular;;   CATARACT EXTRACTION W/ INTRAOCULAR LENS IMPLANT Bilateral    COLONOSCOPY     COLONOSCOPY WITH PROPOFOL N/A 12/22/2018   Procedure: COLONOSCOPY WITH PROPOFOL;  Surgeon: Virgel Manifold, MD;  Location: [facility] ENDOSCOPY;  Service: Endoscopy;  Laterality: N/A;   EYE SURGERY   Spring 2018   Cataracts both eyes   LOOP RECORDER INSERTION N/A 04/23/2020   Procedure: LOOP RECORDER INSERTION;  Surgeon: Evans Lance, MD;  Location: [facility] CV LAB;  Service: Cardiovascular;  Laterality: N/A;   TEE WITHOUT CARDIOVERSION N/A 04/23/2020   Procedure: TRANSESOPHAGEAL ECHOCARDIOGRAM (TEE);  Surgeon: Larey Dresser, MD;  Location[facility] ENDOSCOPY;  Service: Cardiovascular;  Laterality: N/A;   TONSILLECTOMY      There were no vitals filed for this visit.   Subjective Assessment - 10/16/20 1318     Subjective Patient presents to PT session late. Has her surgery later this month.    Pertinent History Pt confirms the following hx is accurate (initially from acute care PT eval): "the pt is a 70 yo female presenting 3/17 with confusion and difficulty word-finding. Imaging revealed L MCA infarcts with nearly occlusive thrombus in proximal M2 and M3." PMH includes the following: CVA in Jan 2022 (for further details see ACUTE PT eval in EMR from 02/29/2020), anxiety, hypothyroidism, depression, OA, osteoporosis, memory change, migraine, seizures, hearing loss.    Limitations Standing;Walking;Lifting;House hold activities;Writing    How long can you stand comfortably? Pt reports she doesn't like to stand still for very long    How long can you walk comfortably? Difficulty  with balance when walking over uneven ground, but states she is able to walk a mile. She says sometimes she gets tired walking and will sit down. She feels overall she's been fairly energetic.    Diagnostic tests per chart: "[place] 04/19/20: MR BRAIN IMPRESSION:  1. Patchy small volume acute ischemic nonhemorrhagic posterior left  MCA distribution infarcts, likely reflecting an embolic shower. No  associated hemorrhage or mass effect.  2. No other acute intracranial abnormality.  3. Underlying mild chronic microvascular ischemic disease.; 3/17 CT ANGIO HEAD NECK:  IMPRESSION:  Nearly occlusive thrombus at the bifurcation of  a proximal M2 MCA  branch approximately 6 mm from the MCA bifurcation. Subsequent  occlusion of a proximal left M3 MCA branch. No stenosis in the neck."    Patient Stated Goals Pt says she wants to improve her balance and does not want to fall again    Currently in Pain? No/denies                [facility] PT Assessment - 10/16/20 0001       Standardized Balance Assessment   Standardized Balance Assessment Dynamic Gait Index      Dynamic Gait Index   Level Surface Normal    Change in Gait Speed Normal    Gait with Horizontal Head Turns Mild Impairment    Gait with Vertical Head Turns Moderate Impairment    Gait and Pivot Turn Normal    Step Over Obstacle Mild Impairment    Step Around Obstacles Normal    Steps Moderate Impairment    Total Score 18             Progress note DGI: 18 ABC: 78.1%  BLE strength grossly 4+/5  FOTO: 68%  DHI: 20   Treatment: [place] :  Flat side up: static stand 30 seconds x 2 trials  Flat side up: mini squats x 2 sets ; 10x each set Round side up: crane lunge step up 10x each LE, SUE support Round side up: static stand 30 seconds x 2 trials Round side up: forward/backward weight shift x10 Round side up: lateral L/R weight shift x 10  Static standing on 1/2 foam - intially unsteady- Increased LOB requiring UE support- Progressed to no UE support with practice- able to hold up to 30 sec x 3 trials.    Patient's condition has the potential to improve in response to therapy. Maximum improvement is yet to be obtained. The anticipated improvement is attainable and reasonable in a generally predictable time.  Patient reports she is not yet where she would like to be.      Patient's DGI remained the same, however patient reports she is feeling off today and typically performs better. Her strength is improving as can be seen in strengthening goal.Patient reports she is enjoying the challenging balance interventions. Patient has an upcoming surgery  at the end of the month. Patient's condition has the potential to improve in response to therapy. Maximum improvement is yet to be obtained. The anticipated improvement is attainable and reasonable in a generally predictable time.Pt will benefit from further skilled PT to continue to improve strength, gait and balance to decrease fall risk                   PT Education - 10/16/20 1318     Education Details goals, progres note    Person(s) Educated Patient    Methods Explanation;Demonstration;Tactile cues;Verbal cues    Comprehension Verbalized understanding;Returned demonstration;Verbal cues  required;Tactile cues required              PT Short Term Goals - 10/16/20 1322       PT SHORT TERM GOAL #1   Title Pt will be independent with HEP in order to improve strength and balance in order to decrease fall risk and improve function at home and work.    Baseline 4/18: to be initiated 7/6: HEP compliant, new HEP given 9/13: compliant    Time 6    Period Weeks    Status Achieved    Target Date 10/04/20               PT Long Term Goals - 10/16/20 1323       PT LONG TERM GOAL #1   Title Pt will improve DGI by at least 3 points in order to demonstrate clinically significant improvement in balance and decreased risk for falls.    Baseline 4/18: 18/24; 7/12: unstable BP, defer to next visit 9/13: 18/24    Time 12    Period Weeks    Status On-going    Target Date 11/08/20      PT LONG TERM GOAL #2   Title Pt will improve ABC scale by at least 13% in order to demonstrate clinically significant improvement in balance confidence.    Baseline 4/18: 63.75% 7/6: 74% 9/13: 78%    Time 12    Period Weeks    Status Partially Met    Target Date 11/08/20      PT LONG TERM GOAL #3   Title Pt will demonstrate improved ability to maintain balance on compliant surface with EC for at least 5 seconds to indicate improved ability to navigate uneven surfaces and ambulate in  dimly lit rooms.    Baseline 4/18: Pt unable to maintain balance with EC on foam on CTSIB-M 7/6: met    Time 12    Period Weeks    Status Achieved      PT LONG TERM GOAL #4   Title Patient will increase BLE gross strength to 4+/5 as to improve functional strength for independent gait, increased standing tolerance and increased ADL ability.    Baseline 4/18: gross BLE strength currnelty 4/5 7/6: hips 4+/5 ankles 4/5 9/13: grossly 4+/5    Time 12    Period Weeks    Status Achieved      PT LONG TERM GOAL #5   Title Patient will increase FOTO score to equal to or greater than 80 to demonstrate improvement in mobility and quality of life.    Baseline 4/18: 74% 7/6: 59%; 7/21: 65 9/13: 68%    Time 12    Period Weeks    Status On-going    Target Date 11/08/20      PT LONG TERM GOAL #6   Title Patient will reduce dizziness handicap inventory score to <10, for less dizziness with ADLs and increased safety with home and work tasks.    Baseline 4/27: DHI score is 16, indicating mild handicap 7/6: 22% 9/13: 20%    Time 11    Period Weeks    Status Partially Met    Target Date 11/08/20                   Plan - 10/16/20 1510     Clinical Impression Statement Patient's DGI remained the same, however patient reports she is feeling off today and typically performs better. Her strength is improving as can be seen  in strengthening goal.Patient reports she is enjoying the challenging balance interventions. Patient has an upcoming surgery at the end of the month. Patient's condition has the potential to improve in response to therapy. Maximum improvement is yet to be obtained. The anticipated improvement is attainable and reasonable in a generally predictable time.Pt will benefit from further skilled PT to continue to improve strength, gait and balance to decrease fall risk    Personal Factors and Comorbidities Age;Comorbidity 1;Comorbidity 2;Past/Current Experience;Sex;Social  Background;Behavior Pattern;Comorbidity 3+    Comorbidities CVA in Jan 2022, anxiety, hypothyroidism, depression, OA, osteoporosis, memory change, migraine, seizures, hearing loss.    Examination-Activity Limitations Bathing;Stairs;Stand;Hygiene/Grooming;Locomotion Level;Carry;Bed Mobility;Other;Reach Overhead;Lift    Examination-Participation Restrictions Community Activity;Yard Work;Shop;Cleaning;Laundry;Medication Management;Meal Prep    Stability/Clinical Decision Making Evolving/Moderate complexity    Rehab Potential Good    PT Frequency 2x / week    PT Duration 12 weeks    PT Treatment/Interventions ADLs/Self Care Home Management;Biofeedback;Canalith Repostioning;Cryotherapy;Electrical Stimulation;Aquatic Therapy;Moist Heat;Ultrasound;DME Instruction;Gait training;Stair training;Functional mobility training;Therapeutic activities;Therapeutic exercise;Balance training;Neuromuscular re-education;Cognitive remediation;Patient/family education;Orthotic Fit/Training;Manual techniques;Passive range of motion;Energy conservation;Taping;Splinting;Vestibular;Visual/perceptual remediation/compensation;Joint Manipulations    PT Next Visit Plan dynamic balance tasks with head turns, balance with EC    PT Home Exercise Plan Access Code: [email or web address], added:  Access Code: [email or web address], no updates    Consulted and Agree with Plan of Care Patient             Patient will benefit from skilled therapeutic intervention in order to improve the following deficits and impairments:  Abnormal gait, Decreased balance, Decreased endurance, Difficulty walking, Decreased range of motion, Dizziness, Improper body mechanics, Impaired vision/preception, Decreased coordination, Decreased strength, Decreased mobility, Obesity, Decreased activity tolerance, Decreased knowledge of use of DME, Impaired flexibility, Postural dysfunction  Visit Diagnosis: Abnormality of gait and mobility  Difficulty in walking, not elsewhere  classified  Muscle weakness (generalized)     Problem List Patient Active Problem List   Diagnosis Date Noted   Thickened endometrium 08/13/2020   Acute ITP ([place]) 05/02/2020   Other intra-abdominal and pelvic swelling, mass and lump 04/26/2020   Pelvic mass in female 04/25/2020   Ovarian cancer screening 04/25/2020   Ovarian mass, left 04/25/2020   Stroke (cerebrum) ([place]) 04/20/2020   Acute CVA (cerebrovascular accident) ([place]) 04/19/2020   CVA (cerebral vascular accident) ([place]) 04/19/2020   History of ischemic multifocal posterior circulation stroke 02/29/2020   Thrombocytopenia ([place]) 02/28/2020   HPV test positive 07/08/2015   Difficulty hearing 06/28/2015   Arthritis, degenerative 06/28/2015   Obesity, Class I, BMI 30-34.9 06/28/2015   Paresthesia 06/28/2015   Purpura, nonthrombopenic ([place]) 06/28/2015   Other specified hypothyroidism 08/09/2014   Migraine without aura and without status migrainosus, not intractable 09/19/2008   Moderate major depression ([place]) 09/07/2006   OP (osteoporosis) 09/07/2006    Janna Arch, PT, DPT  10/16/2020, 3:11 PM  [place] [facility] MAIN [facility] SERVICES [address] [place], [place], [address] Phone: [phone]   Fax:  [phone]  Name: NAJAI WASZAK MRN: [id number] Date of Birth: 20-Mar-1950

## 2020-10-17 ENCOUNTER — Telehealth: Payer: Self-pay

## 2020-10-17 NOTE — Telephone Encounter (Signed)
LM for Ms Lovegrove to call back to discuss directions for Plavix and Xarelto.

## 2020-10-17 NOTE — Telephone Encounter (Signed)
When patient calls back she needs to know that she needs to hold the Plavix and Xarelto 5 days prior to her surgery(not to include the surgery date) per Dr. Berline Lopes. Her last dose of both medication would be Friday 10-26-20. She can resume the medications 2 days after surgery 11-01-20. Resume meds on 11-03-20.

## 2020-10-18 NOTE — Telephone Encounter (Signed)
Received call from Natalie [name]. Reviewed surgery recommendations for Plavix and Xarelto. She is to stop them 5 days prior to surgery. Her last dose of both medications would be Friday 10-26-20. She can resume the medications 2 days after surgery 11-01-20. Resume meds on 11-03-20. Patient verbalized understanding. Instructed to call with any questions or concerns.

## 2020-10-19 NOTE — Progress Notes (Signed)
Patient here for a pre-operative appointment prior to her scheduled surgery on November 01, 2020. She is scheduled for dilation and curettage of the uterus with hysteroscopy/Myosure.  She has her pre-admission testing appointment this am at [facility].  The surgery was discussed in detail.  See after visit summary for additional details. Visual aids used to discuss items related to surgery including the sequential compression stockings, IV pump, multi-modal pain regimen including tylenol, female reproductive system to discuss surgery in detail.      We discussed the use of tylenol post-op and to monitor for a maximum of 4,000 mg in a 24 hour period. Discussed bowel regimen as well if needed for constipation. Reviewed instructions for plavix and xarelto before and after her procedure.   Discussed the use of SCDs and measures to take at home to prevent DVT including frequent mobility.  Reportable signs and symptoms of DVT discussed. Post-operative instructions discussed and expectations for after surgery.     5 minutes spent with the patient.  Verbalizing understanding of material discussed. No needs or concerns voiced at the end of the visit.     This appointment is included in the global surgical bundle as pre-operative teaching and has no charge.

## 2020-10-19 NOTE — Patient Instructions (Signed)
Preparing for your Surgery  Plan for surgery on November 01, 2020 with Dr. Jeral Pinch at [facility] will be scheduled for a dilation and curettage of the uterus with hysteroscopy (looking with a camera) with Myosure.   Plan to stop your plavix and xarelto 5 days prior to surgery. Last dose of both medications would be Friday 10-26-20. You can resume the medications 2 days after surgery on 11-03-20.  Pre-operative Testing -You will receive a phone call from presurgical testing at [facility] to discuss surgery instructions and arrange for lab work if needed.  -Bring your insurance card, copy of an advanced directive if applicable, medication list.  -Do not take supplements such as fish oil (omega 3), red yeast rice, turmeric before your surgery. You want to avoid medications with aspirin in them including headache powders such as BC or Goody's), Excedrin migraine.  Day Before Surgery at [facility] will be advised you can have clear liquids up until 3 hours before your surgery.    Your role in recovery Your role is to become active as soon as directed by your doctor, while still giving yourself time to heal.  Rest when you feel tired. You will be asked to do the following in order to speed your recovery:  - Cough and breathe deeply. This helps to clear and expand your lungs and can prevent pneumonia after surgery.  - [place]. Do mild physical activity. Walking or moving your legs help your circulation and body functions return to normal. Do not try to get up or walk alone the first time after surgery.   -If you develop swelling on one leg or the other, pain in the back of your leg, redness/warmth in one of your legs, please call the office or go to the Emergency Room to have a doppler to rule out a blood clot. For shortness of breath, chest pain-seek care in the Emergency Room as soon as possible. - Actively manage your pain. Managing your pain  lets you move in comfort. We will ask you to rate your pain on a scale of zero to 10. It is your responsibility to tell your doctor or nurse where and how much you hurt so your pain can be treated.  Special Considerations -Your final pathology results from surgery should be available around one week after surgery and the results will be relayed to you when available.  -FMLA forms can be faxed to [phone] and please allow 5-7 business days for completion.  Pain Management After Surgery  -Make sure that you have Tylenol at home to use on a regular basis after surgery for pain control.   -Review the attached handout on narcotic use and their risks and side effects.   Bowel Regimen -It is important to prevent constipation and drink adequate amounts of liquids.   Risks of Surgery Risks of surgery are low but include bleeding, infection, damage to surrounding structures, re-operation, blood clots, and very rarely death.  AFTER SURGERY INSTRUCTIONS  Return to work: 1-2 days if applicable  Activity: 1. Be up and out of the bed during the day.  Take a nap if needed.  You may walk up steps but be careful and use the hand rail.  Stair climbing will tire you more than you think, you may need to stop part way and rest.   2. No lifting or straining for 1 week over 10 pounds. No pushing, pulling, straining for 1 week.  3. No driving for minimum 24 hours after surgery.  Make sure that your reaction time has returned.   4. You can shower as soon as the next day after surgery. Shower daily. No tub baths or submerging your body in water until cleared by your surgeon.  5. No sexual activity and nothing in the vagina for 4 weeks.  8. You may experience vaginal spotting and discharge after surgery.  The spotting is normal but if you experience heavy bleeding, call our office.  9. Take Tylenol for pain. Monitor your Tylenol intake to a max of 4,000 mg in a 24 hour period.  Diet: 1. Low sodium  Heart Healthy Diet is recommended but you are cleared to resume your normal (before surgery) diet after your procedure.  2. It is safe to use a laxative, such as Miralax or Colace, if you have difficulty moving your bowels.   Wound Care: 1. Keep clean and dry.  Shower daily.  Reasons to call the Doctor: Fever - Oral temperature greater than 100.4 degrees Fahrenheit Foul-smelling vaginal discharge Difficulty urinating Nausea and vomiting Increased pain at the site of the incision that is unrelieved with pain medicine. Difficulty breathing with or without chest pain New calf pain especially if only on one side Sudden, continuing increased vaginal bleeding with or without clots.   Contacts: For questions or concerns you should contact:  Dr. Jeral Pinch at [phone]  [name], NP at [phone]  After Hours: call [phone] and have the GYN Oncologist paged/contacted (after 5 pm or on the weekends).  Messages sent via mychart are for non-urgent matters and are not responded to after hours so for urgent needs, please call the after hours number.

## 2020-10-22 ENCOUNTER — Ambulatory Visit: Payer: Medicare Other

## 2020-10-22 NOTE — Patient Instructions (Addendum)
DUE TO COVID-19 ONLY ONE VISITOR IS ALLOWED TO COME WITH YOU AND STAY IN THE WAITING ROOM ONLY DURING PRE OP AND PROCEDURE DAY OF SURGERY IF YOU ARE GOING HOME AFTER SURGERY. IF YOU ARE SPENDING THE NIGHT 2 PEOPLE MAY VISIT WITH YOU IN YOUR PRIVATE ROOM AFTER SURGERY UNTIL VISITING  HOURS ARE OVER AT 800 PM AND THE 2 VISITORS CANNOT SPEND THE NIGHT.               Natalie [name]     Your procedure is scheduled on: 11/01/20   Report to [facility] Main  Entrance   Report to short stay at 5:15 AM     Call this number if you have problems the morning of surgery [phone]    Remember: Do not eat food  :After Midnight the night before your surgery,     You may have clear liquids from midnight until 4:30 am    CLEAR LIQUID DIET   Foods Allowed                                                                     Foods Excluded                                                                                            liquids that you cannot  Plain Jell-O any favor except red or purple                                           see through such as: Fruit ices (not with fruit pulp)                                     milk, soups, orange juice  Iced Popsicles                                    All solid food Carbonated beverages, regular and diet                                    Cranberry, grape and apple juices Sports drinks like Gatorade Lightly seasoned clear broth or consume(fat free) Sugar    BRUSH YOUR TEETH MORNING OF SURGERY AND RINSE YOUR MOUTH OUT, NO CHEWING GUM CANDY OR MINTS.     Take these medicines the morning of surgery with A SIP OF WATER: Bupropion, Citalopram, Synthroid  Stop taking _Plavix and Xarelto__________on _9/23/22_________as instructed by ____Dr. tucker_________.  You may not have any metal on your body including hair pins and              piercings  Do not wear jewelry, make-up, lotions, powders or perfumes,  deodorant             Do not wear nail polish on your fingernails.  Do not shave  48 hours prior to surgery.                 Do not bring valuables to the hospital. [place].  Contacts, dentures or bridgework may not be worn into surgery.       Patients discharged the day of surgery will not be allowed to drive home.   IF YOU ARE HAVING SURGERY AND GOING HOME THE SAME DAY, YOU MUST HAVE AN ADULT TO DRIVE YOU HOME AND BE WITH YOU FOR 24 HOURS.  YOU MAY GO HOME BY TAXI OR UBER OR ORTHERWISE, BUT AN ADULT MUST ACCOMPANY YOU HOME AND STAY WITH YOU FOR 24 HOURS.  Name and phone number of your driver:  Special Instructions: N/A              Please read over the following fact sheets you were given: _____________________________________________________________________             [facility] - Preparing for Surgery Before surgery, you can play an important role.  Because skin is not sterile, your skin needs to be as free of germs as possible.  You can reduce the number of germs on your skin by washing with CHG (chlorahexidine gluconate) soap before surgery.  CHG is an antiseptic cleaner which kills germs and bonds with the skin to continue killing germs even after washing. Please DO NOT use if you have an allergy to CHG or antibacterial soaps.  If your skin becomes reddened/irritated stop using the CHG and inform your nurse when you arrive at Short Stay. Do not shave (including legs and underarms) for at least 48 hours prior to the first CHG shower.    Please follow these instructions carefully:  1.  Shower with CHG Soap the night before surgery and the  morning of Surgery.  2.  If you choose to wash your hair, wash your hair first as usual with your  normal  shampoo.  3.  After you shampoo, rinse your hair and body thoroughly to remove the  shampoo.                            4.  Use CHG as you would any other liquid soap.  You can apply chg  directly  to the skin and wash                       Gently with a scrungie or clean washcloth.  5.  Apply the CHG Soap to your body ONLY FROM THE NECK DOWN.   Do not use on face/ open                           Wound or open sores. Avoid contact with eyes, ears mouth and genitals (private parts).                       Wash face,  Genitals (private parts) with your normal soap.             6.  Wash thoroughly, paying special attention to the area where your surgery  will be performed.  7.  Thoroughly rinse your body with warm water from the neck down.  8.  DO NOT shower/wash with your normal soap after using and rinsing off  the CHG Soap.             9.  Pat yourself dry with a clean towel.            10.  Wear clean pajamas.            11.  Place clean sheets on your bed the night of your first shower and do not  sleep with pets. Day of Surgery : Do not apply any lotions/deodorants the morning of surgery.  Please wear clean clothes to the hospital/surgery center.  FAILURE TO FOLLOW THESE INSTRUCTIONS MAY RESULT IN THE CANCELLATION OF YOUR SURGERY PATIENT SIGNATURE_________________________________  NURSE SIGNATURE__________________________________  ________________________________________________________________________

## 2020-10-23 ENCOUNTER — Other Ambulatory Visit: Payer: Self-pay

## 2020-10-23 ENCOUNTER — Encounter (HOSPITAL_COMMUNITY)
Admission: RE | Admit: 2020-10-23 | Discharge: 2020-10-23 | Disposition: A | Discharge status: Home / Self Care | Payer: Medicare Other | Source: Ambulatory Visit | Attending: Gynecologic Oncology | Admitting: Gynecologic Oncology

## 2020-10-23 ENCOUNTER — Inpatient Hospital Stay (HOSPITAL_BASED_OUTPATIENT_CLINIC_OR_DEPARTMENT_OTHER): Payer: Medicare Other | Admitting: Gynecologic Oncology

## 2020-10-23 ENCOUNTER — Encounter (HOSPITAL_COMMUNITY): Payer: Self-pay

## 2020-10-23 ENCOUNTER — Ambulatory Visit: Payer: Medicare Other

## 2020-10-23 VITALS — BP 131/71 | HR 72 | Temp 97.8°F | Resp 18 | Wt 181.6 lb

## 2020-10-23 DIAGNOSIS — N939 Abnormal uterine and vaginal bleeding, unspecified: Secondary | ICD-10-CM

## 2020-10-23 DIAGNOSIS — Z79890 Hormone replacement therapy (postmenopausal): Secondary | ICD-10-CM | POA: Insufficient documentation

## 2020-10-23 DIAGNOSIS — Z8673 Personal history of transient ischemic attack (TIA), and cerebral infarction without residual deficits: Secondary | ICD-10-CM | POA: Insufficient documentation

## 2020-10-23 DIAGNOSIS — Z7902 Long term (current) use of antithrombotics/antiplatelets: Secondary | ICD-10-CM | POA: Diagnosis not present

## 2020-10-23 DIAGNOSIS — Z01812 Encounter for preprocedural laboratory examination: Secondary | ICD-10-CM | POA: Diagnosis not present

## 2020-10-23 DIAGNOSIS — N958 Other specified menopausal and perimenopausal disorders: Secondary | ICD-10-CM | POA: Diagnosis not present

## 2020-10-23 DIAGNOSIS — Z7901 Long term (current) use of anticoagulants: Secondary | ICD-10-CM | POA: Insufficient documentation

## 2020-10-23 DIAGNOSIS — R9389 Abnormal findings on diagnostic imaging of other specified body structures: Secondary | ICD-10-CM

## 2020-10-23 DIAGNOSIS — Z79899 Other long term (current) drug therapy: Secondary | ICD-10-CM | POA: Diagnosis not present

## 2020-10-23 DIAGNOSIS — N950 Postmenopausal bleeding: Secondary | ICD-10-CM | POA: Diagnosis not present

## 2020-10-23 DIAGNOSIS — E039 Hypothyroidism, unspecified: Secondary | ICD-10-CM | POA: Diagnosis not present

## 2020-10-23 HISTORY — DX: Other specified postprocedural states: Z98.890

## 2020-10-23 LAB — CBC
HCT: 38.8 % (ref 36.0–46.0)
Hemoglobin: 12.4 g/dL (ref 12.0–15.0)
MCH: 31.1 pg (ref 26.0–34.0)
MCHC: 32 g/dL (ref 30.0–36.0)
MCV: 97.2 fL (ref 80.0–100.0)
Platelets: 197 10*3/uL (ref 150–400)
RBC: 3.99 MIL/uL (ref 3.87–5.11)
RDW: 12 % (ref 11.5–15.5)
WBC: 5.6 10*3/uL (ref 4.0–10.5)
nRBC: 0 % (ref 0.0–0.2)

## 2020-10-23 LAB — COMPREHENSIVE METABOLIC PANEL
ALT: 21 U/L (ref 0–44)
AST: 20 U/L (ref 15–41)
Albumin: 3.9 g/dL (ref 3.5–5.0)
Alkaline Phosphatase: 98 U/L (ref 38–126)
Anion gap: 8 (ref 5–15)
BUN: 38 mg/dL — ABNORMAL HIGH (ref 8–23)
CO2: 26 mmol/L (ref 22–32)
Calcium: 9.3 mg/dL (ref 8.9–10.3)
Chloride: 111 mmol/L (ref 98–111)
Creatinine, Ser: 0.79 mg/dL (ref 0.44–1.00)
GFR, Estimated: >60 mL/min (ref >60)
Glucose, Bld: 104 mg/dL — ABNORMAL HIGH (ref 70–99)
Potassium: 4.5 mmol/L (ref 3.5–5.1)
Sodium: 145 mmol/L (ref 135–145)
Total Bilirubin: 0.5 mg/dL (ref 0.3–1.2)
Total Protein: 6.9 g/dL (ref 6.5–8.1)

## 2020-10-23 NOTE — Progress Notes (Signed)
COVID test Completed:NA  PCP - Dr. Raliegh Ip. Sowles/  Cardiologist - Dr. Prince Rome LOV 05/08/20  Chest x-ray - no EKG - 08/07/20-epic Stress Test - 03/19/20-epic ECHO - 02/09/20-epic Cardiac Cath - no Pacemaker/ICD device last checked: Loop recorder 10/15/20 per the Pt.  Sleep Study - no CPAP -   Fasting Blood Sugar - NA Checks Blood Sugar _____ times a day  Blood Thinner Instructions:Plavix and Xarelto/ Dr. Clayborn Bigness Aspirin Instructions:Stop 5 days prior to DOS/ Dr. Berline Lopes Last Dose:10/26/20  Anesthesia review: yes  Patient denies shortness of breath, fever, cough and chest pain at PAT appointment Pt reports no SOB but she has fatigue come and go after her stroke in March 2022. Her gait is steady and memory impairment is mild.  Patient verbalized understanding of instructions that were given to them at the PAT appointment. Patient was also instructed that they will need to review over the PAT instructions again at home before surgery. Yes

## 2020-10-24 ENCOUNTER — Encounter: Payer: Self-pay | Admitting: Gynecologic Oncology

## 2020-10-24 ENCOUNTER — Telehealth: Payer: Self-pay

## 2020-10-24 NOTE — Progress Notes (Signed)
Anesthesia Chart Review   Case: [id number] Date/Time: 11/01/20 0715   Procedure: DILATATION AND CURETTAGE /HYSTEROSCOPY WITH MYOSURE   Anesthesia type: Choice   Pre-op diagnosis: POST MENOPAUSAL BLEEDING,THICKENED ENDOMETRIUM   Location: [facility] ROOM 05 / [facility]   Surgeons: Lafonda Mosses, MD       DISCUSSION:70 y.o. former smoker with h/o hypothyroidism, CVA, post menopausal bleeding scheduled for above procedure 11/01/2020 with Dr. Jeral Pinch.   Last seen by cardiology 06/21/2020.  VS: BP 129/85   Pulse 62   Temp 36.7 C (Oral)   Resp 20   Ht 5\' 5"  (1.651 m)   Wt 81.2 kg   SpO2 97%   BMI 29.79 kg/m   PROVIDERS: [name], MD is PCP  Lujean Amel, MD is Cardiologist  LABS: Labs reviewed: Acceptable for surgery. (all labs ordered are listed, but only abnormal results are displayed)  Labs Reviewed  COMPREHENSIVE METABOLIC PANEL - Abnormal; Notable for the following components:      Result Value   Glucose, Bld 104 (*)    BUN 38 (*)    All other components within normal limits  CBC  TYPE AND SCREEN     IMAGES:   EKG: 08/07/2020 Rate 65 bpm  NSR Left anterior fascicular block  Moderate voltage criteria for LVH Septal infarct, age undetermined  CV: Echo 04/23/2020  1. Left ventricular ejection fraction, by estimation, is 55 to 60%. The  left ventricle has normal function. The left ventricle has no regional  wall motion abnormalities.   2. Right ventricular systolic function is normal. The right ventricular  size is normal.   3. Left atrial size was moderately dilated. No left atrial/left atrial  appendage thrombus was detected.   4. The mitral valve is normal in structure. Trivial mitral valve  regurgitation. No evidence of mitral stenosis.   5. The aortic valve is tricuspid. Aortic valve regurgitation is trivial.  No aortic stenosis is present.   6. Normal caliber thoracic aorta with minimal plaque.   7. No PFO or ASD by color doppler or bubble  study. Past Medical History:  Diagnosis Date   Acute bronchospasm due to viral infection 2020   Due to pollen   Anemia    history of   CVA (cerebral vascular accident) ([place]) 02/28/2020   and 04/19/20   Depressive disorder    Epistaxis    Fatigue    Hearing loss    History of loop recorder    Hypothyroidism    Memory change    Migraine    Obesity (BMI 30.0-34.9)    lost 60 lbs   Osteoarthritis    Osteoporosis    Polyuria    Premature menopause    Seizures ([place]) 1994   history of mini seizures, possible migraine induced    Past Surgical History:  Procedure Laterality Date   ABLATION  2006   BUBBLE STUDY  04/23/2020   Procedure: BUBBLE STUDY;  Surgeon: Larey Dresser, MD;  Location: [place];  Service: Cardiovascular;;   CATARACT EXTRACTION W/ INTRAOCULAR LENS IMPLANT Bilateral    COLONOSCOPY     COLONOSCOPY WITH PROPOFOL N/A 12/22/2018   Procedure: COLONOSCOPY WITH PROPOFOL;  Surgeon: Virgel Manifold, MD;  Location: [facility] ENDOSCOPY;  Service: Endoscopy;  Laterality: N/A;   EYE SURGERY  Spring 2018   Cataracts both eyes   LOOP RECORDER INSERTION N/A 04/23/2020   Procedure: LOOP RECORDER INSERTION;  Surgeon: Evans Lance, MD;  Location: [place] CV LAB;  Service:  Cardiovascular;  Laterality: N/A;   TEE WITHOUT CARDIOVERSION N/A 04/23/2020   Procedure: TRANSESOPHAGEAL ECHOCARDIOGRAM (TEE);  Surgeon: Larey Dresser, MD;  Location: [facility] ENDOSCOPY;  Service: Cardiovascular;  Laterality: N/A;   TONSILLECTOMY      MEDICATIONS:  atorvastatin (LIPITOR) 40 MG tablet   buPROPion (WELLBUTRIN SR) 150 MG 12 hr tablet   citalopram (CELEXA) 40 MG tablet   clindamycin-benzoyl peroxide (BENZACLIN) gel   clopidogrel (PLAVIX) 75 MG tablet   Cyanocobalamin (VITAMIN B-12) 1000 MCG SUBL   levothyroxine (SYNTHROID) 75 MCG tablet   Multiple Vitamin (MULTIVITAMIN WITH MINERALS) TABS tablet   polyvinyl alcohol (LIQUIFILM TEARS) 1.4 % ophthalmic solution   rivaroxaban (XARELTO) 20  MG TABS tablet   Vitamin D, Ergocalciferol, (DRISDOL) 1.25 MG (50000 UNIT) CAPS capsule   No current facility-administered medications for this encounter.    Konrad Felix Ward, PA-C [facility] [phone]

## 2020-10-24 NOTE — Telephone Encounter (Signed)
Spoke with Natalie [name], instructed she is to take her last dose of blood thinners prior to surgery on 10/26/20.  Patient verbalized understanding. Per patient request Natalie [name] 100mg  has been added to her medication list. Instructed to call with any questions or concerns.

## 2020-10-25 ENCOUNTER — Other Ambulatory Visit: Payer: Self-pay

## 2020-10-25 ENCOUNTER — Ambulatory Visit: Payer: Medicare Other

## 2020-10-25 ENCOUNTER — Encounter: Payer: Self-pay | Admitting: Gynecologic Oncology

## 2020-10-25 DIAGNOSIS — R262 Difficulty in walking, not elsewhere classified: Secondary | ICD-10-CM | POA: Diagnosis not present

## 2020-10-25 DIAGNOSIS — R269 Unspecified abnormalities of gait and mobility: Secondary | ICD-10-CM

## 2020-10-25 DIAGNOSIS — R2681 Unsteadiness on feet: Secondary | ICD-10-CM | POA: Diagnosis not present

## 2020-10-25 DIAGNOSIS — R2689 Other abnormalities of gait and mobility: Secondary | ICD-10-CM | POA: Diagnosis not present

## 2020-10-25 DIAGNOSIS — R278 Other lack of coordination: Secondary | ICD-10-CM | POA: Diagnosis not present

## 2020-10-25 DIAGNOSIS — M6281 Muscle weakness (generalized): Secondary | ICD-10-CM | POA: Diagnosis not present

## 2020-10-25 NOTE — Therapy (Signed)
[place] MAIN [facility] SERVICES [address] [place], [place], [address] Phone: [phone]   Fax:  [phone]  Physical Therapy Treatment  Patient Details  Name: Natalie [name] MRN: [id number] Date of Birth: 12-10-1950 Referring Provider (PT): [name], MD   Encounter Date: 10/25/2020   PT End of Session - 10/25/20 1340     Visit Number 21    Number of Visits 35    Date for PT Re-Evaluation 11/08/20    Authorization Type Traditional Medicare with [employer] Supplemental: VL based on certification    Authorization Time Period 08/14/20-11/08/20    PT Start Time 1345    PT Stop Time 1429    PT Time Calculation (min) 44 min    Equipment Utilized During Treatment Gait belt    Activity Tolerance Patient tolerated treatment well    Behavior During Therapy [facility] for tasks assessed/performed             Past Medical History:  Diagnosis Date   Acute bronchospasm due to viral infection 2020   Due to pollen   Anemia    history of   CVA (cerebral vascular accident) ([place]) 02/28/2020   and 04/19/20   Depressive disorder    Epistaxis    Fatigue    Hearing loss    History of loop recorder    Hypothyroidism    Memory change    Migraine    Obesity (BMI 30.0-34.9)    lost 60 lbs   Osteoarthritis    Osteoporosis    Polyuria    Premature menopause    Seizures ([place]) 1994   history of mini seizures, possible migraine induced    Past Surgical History:  Procedure Laterality Date   ABLATION  2006   BUBBLE STUDY  04/23/2020   Procedure: BUBBLE STUDY;  Surgeon: Larey Dresser, MD;  Location: [place];  Service: Cardiovascular;;   CATARACT EXTRACTION W/ INTRAOCULAR LENS IMPLANT Bilateral    COLONOSCOPY     COLONOSCOPY WITH PROPOFOL N/A 12/22/2018   Procedure: COLONOSCOPY WITH PROPOFOL;  Surgeon: Virgel Manifold, MD;  Location: [facility] ENDOSCOPY;  Service: Endoscopy;  Laterality: N/A;   EYE SURGERY  Spring 2018   Cataracts both  eyes   LOOP RECORDER INSERTION N/A 04/23/2020   Procedure: LOOP RECORDER INSERTION;  Surgeon: Evans Lance, MD;  Location: [place] CV LAB;  Service: Cardiovascular;  Laterality: N/A;   TEE WITHOUT CARDIOVERSION N/A 04/23/2020   Procedure: TRANSESOPHAGEAL ECHOCARDIOGRAM (TEE);  Surgeon: Larey Dresser, MD;  Location: [facility] ENDOSCOPY;  Service: Cardiovascular;  Laterality: N/A;   TONSILLECTOMY      There were no vitals filed for this visit.   Subjective Assessment - 10/25/20 1407     Subjective ?Patient one has more PT session prior to her surgery next week. Had a migraine yesterday wasn't able to do much because of it.    Pertinent History Pt confirms the following hx is accurate (initially from acute care PT eval): "the pt is a 70 yo female presenting 3/17 with confusion and difficulty word-finding. Imaging revealed L MCA infarcts with nearly occlusive thrombus in proximal M2 and M3." PMH includes the following: CVA in Jan 2022 (for further details see ACUTE PT eval in EMR from 02/29/2020), anxiety, hypothyroidism, depression, OA, osteoporosis, memory change, migraine, seizures, hearing loss.    Limitations Standing;Walking;Lifting;House hold activities;Writing    How long can you stand comfortably? Pt reports she doesn't like to stand still for very long  How long can you walk comfortably? Difficulty with balance when walking over uneven ground, but states she is able to walk a mile. She says sometimes she gets tired walking and will sit down. She feels overall she's been fairly energetic.    Diagnostic tests per chart: "[place] 04/19/20: MR BRAIN IMPRESSION:  1. Patchy small volume acute ischemic nonhemorrhagic posterior left  MCA distribution infarcts, likely reflecting an embolic shower. No  associated hemorrhage or mass effect.  2. No other acute intracranial abnormality.  3. Underlying mild chronic microvascular ischemic disease.; 3/17 CT ANGIO HEAD NECK:  IMPRESSION:  Nearly occlusive  thrombus at the bifurcation of a proximal M2 MCA  branch approximately 6 mm from the MCA bifurcation. Subsequent  occlusion of a proximal left M3 MCA branch. No stenosis in the neck."    Patient Stated Goals Pt says she wants to improve her balance and does not want to fall again    Currently in Pain? No/denies                Patient one has more PT session prior to her surgery next week.     BPmid session: seated 111/82 standing 96/64    Treatment: [place] :  Flat side up: static stand 30 seconds x 2 trials  Flat side up: mini squats x 2 sets ; 10x each set Flat side up: lateral weight shifts 10x each direction Flat side up: anterior/posterior weight shift 10x each direction  Half foam roller: df/pf 10x Half foam roller: modified tandem stance 30 seconds each LE placement  BP taken due to patient reporting fuzzy headedness/lightheadedness    Seated with # 4lb ankle weights  -Seated marches with upright posture, back away from back of chair for abdominal/trunk activation/stabilization, 12x each LE -Seated LAQ with 3 second holds, 12x each LE, cueing for muscle activation and sequencing for neutral alignment -Seated IR/ER with cueing for stabilizing knee placement with lateral foot movement for optimal muscle recruitment, 12x each LE -heel raises 15x   -lateral step over hedgehog 15x each LE   Pt educated throughout session about proper posture and technique with exercises. Improved exercise technique, movement at target joints, use of target muscles after min to mod verbal, visual, tactile cues.    Patient session limited by lowered blood pressure. She will have one more PT session prior to surgery, will be seeking guidance from surgeon about protocol s/p surgery for when she can return to surgery. Rest of session performed in seated position due to low blood pressure. Education on deep breaths in standing prior to movement when blood pressure is as low as it is. Pt will  benefit from further skilled PT to continue to improve strength, gait and balance to decrease fall risk                PT Education - 10/25/20 1340     Education Details exercise technique, body mechanics, POC with surgery    Person(s) Educated Patient    Methods Explanation;Demonstration;Tactile cues;Verbal cues    Comprehension Verbalized understanding;Returned demonstration;Verbal cues required;Tactile cues required              PT Short Term Goals - 10/16/20 1322       PT SHORT TERM GOAL #1   Title Pt will be independent with HEP in order to improve strength and balance in order to decrease fall risk and improve function at home and work.    Baseline 4/18: to be initiated 7/6: HEP compliant,  new HEP given 9/13: compliant    Time 6    Period Weeks    Status Achieved    Target Date 10/04/20               PT Long Term Goals - 10/16/20 1323       PT LONG TERM GOAL #1   Title Pt will improve DGI by at least 3 points in order to demonstrate clinically significant improvement in balance and decreased risk for falls.    Baseline 4/18: 18/24; 7/12: unstable BP, defer to next visit 9/13: 18/24    Time 12    Period Weeks    Status On-going    Target Date 11/08/20      PT LONG TERM GOAL #2   Title Pt will improve ABC scale by at least 13% in order to demonstrate clinically significant improvement in balance confidence.    Baseline 4/18: 63.75% 7/6: 74% 9/13: 78%    Time 12    Period Weeks    Status Partially Met    Target Date 11/08/20      PT LONG TERM GOAL #3   Title Pt will demonstrate improved ability to maintain balance on compliant surface with EC for at least 5 seconds to indicate improved ability to navigate uneven surfaces and ambulate in dimly lit rooms.    Baseline 4/18: Pt unable to maintain balance with EC on foam on CTSIB-M 7/6: met    Time 12    Period Weeks    Status Achieved      PT LONG TERM GOAL #4   Title Patient will increase BLE  gross strength to 4+/5 as to improve functional strength for independent gait, increased standing tolerance and increased ADL ability.    Baseline 4/18: gross BLE strength currnelty 4/5 7/6: hips 4+/5 ankles 4/5 9/13: grossly 4+/5    Time 12    Period Weeks    Status Achieved      PT LONG TERM GOAL #5   Title Patient will increase FOTO score to equal to or greater than 80 to demonstrate improvement in mobility and quality of life.    Baseline 4/18: 74% 7/6: 59%; 7/21: 65 9/13: 68%    Time 12    Period Weeks    Status On-going    Target Date 11/08/20      PT LONG TERM GOAL #6   Title Patient will reduce dizziness handicap inventory score to <10, for less dizziness with ADLs and increased safety with home and work tasks.    Baseline 4/27: DHI score is 16, indicating mild handicap 7/6: 22% 9/13: 20%    Time 11    Period Weeks    Status Partially Met    Target Date 11/08/20                   Plan - 10/25/20 1410     Clinical Impression Statement Patient session limited by lowered blood pressure. She will have one more PT session prior to surgery, will be seeking guidance from surgeon about protocol s/p surgery for when she can return to surgery. Rest of session performed in seated position due to low blood pressure. Education on deep breaths in standing prior to movement when blood pressure is as low as it is. Pt will benefit from further skilled PT to continue to improve strength, gait and balance to decrease fall risk    Personal Factors and Comorbidities Age;Comorbidity 1;Comorbidity 2;Past/Current Experience;Sex;Social Background;Behavior Pattern;Comorbidity 3+  Comorbidities CVA in Jan 2022, anxiety, hypothyroidism, depression, OA, osteoporosis, memory change, migraine, seizures, hearing loss.    Examination-Activity Limitations Bathing;Stairs;Stand;Hygiene/Grooming;Locomotion Level;Carry;Bed Mobility;Other;Reach Overhead;Lift    Examination-Participation Restrictions  Community Activity;Yard Work;Shop;Cleaning;Laundry;Medication Management;Meal Prep    Stability/Clinical Decision Making Evolving/Moderate complexity    Rehab Potential Good    PT Frequency 2x / week    PT Duration 12 weeks    PT Treatment/Interventions ADLs/Self Care Home Management;Biofeedback;Canalith Repostioning;Cryotherapy;Electrical Stimulation;Aquatic Therapy;Moist Heat;Ultrasound;DME Instruction;Gait training;Stair training;Functional mobility training;Therapeutic activities;Therapeutic exercise;Balance training;Neuromuscular re-education;Cognitive remediation;Patient/family education;Orthotic Fit/Training;Manual techniques;Passive range of motion;Energy conservation;Taping;Splinting;Vestibular;Visual/perceptual remediation/compensation;Joint Manipulations    PT Next Visit Plan dynamic balance tasks with head turns, balance with EC    PT Home Exercise Plan Access Code: [email or web address], added:  Access Code: [email or web address], no updates    Consulted and Agree with Plan of Care Patient             Patient will benefit from skilled therapeutic intervention in order to improve the following deficits and impairments:  Abnormal gait, Decreased balance, Decreased endurance, Difficulty walking, Decreased range of motion, Dizziness, Improper body mechanics, Impaired vision/preception, Decreased coordination, Decreased strength, Decreased mobility, Obesity, Decreased activity tolerance, Decreased knowledge of use of DME, Impaired flexibility, Postural dysfunction  Visit Diagnosis: Abnormality of gait and mobility  Difficulty in walking, not elsewhere classified  Muscle weakness (generalized)     Problem List Patient Active Problem List   Diagnosis Date Noted   Thickened endometrium 08/13/2020   Acute ITP ([place]) 05/02/2020   Other intra-abdominal and pelvic swelling, mass and lump 04/26/2020   Pelvic mass in female 04/25/2020   Ovarian cancer screening 04/25/2020   Ovarian mass, left 04/25/2020    Stroke (cerebrum) ([place]) 04/20/2020   Acute CVA (cerebrovascular accident) ([place]) 04/19/2020   CVA (cerebral vascular accident) ([place]) 04/19/2020   History of ischemic multifocal posterior circulation stroke 02/29/2020   Thrombocytopenia ([place]) 02/28/2020   HPV test positive 07/08/2015   Difficulty hearing 06/28/2015   Arthritis, degenerative 06/28/2015   Obesity, Class I, BMI 30-34.9 06/28/2015   Paresthesia 06/28/2015   Purpura, nonthrombopenic ([place]) 06/28/2015   Other specified hypothyroidism 08/09/2014   Migraine without aura and without status migrainosus, not intractable 09/19/2008   Moderate major depression ([place]) 09/07/2006   OP (osteoporosis) 09/07/2006    Janna Arch, PT, DPT  10/25/2020, 2:31 PM  [place] [facility] MAIN [facility] SERVICES [address] [place], [place], [address] Phone: [phone]   Fax:  [phone]  Name: Natalie [name] MRN: [id number] Date of Birth: 13-Mar-1950

## 2020-10-29 ENCOUNTER — Ambulatory Visit: Payer: Medicare Other

## 2020-10-29 ENCOUNTER — Encounter: Payer: Self-pay | Admitting: Gynecologic Oncology

## 2020-10-29 ENCOUNTER — Other Ambulatory Visit: Payer: Self-pay

## 2020-10-29 DIAGNOSIS — R262 Difficulty in walking, not elsewhere classified: Secondary | ICD-10-CM | POA: Diagnosis not present

## 2020-10-29 DIAGNOSIS — M6281 Muscle weakness (generalized): Secondary | ICD-10-CM

## 2020-10-29 DIAGNOSIS — R2689 Other abnormalities of gait and mobility: Secondary | ICD-10-CM | POA: Diagnosis not present

## 2020-10-29 DIAGNOSIS — R2681 Unsteadiness on feet: Secondary | ICD-10-CM | POA: Diagnosis not present

## 2020-10-29 DIAGNOSIS — R269 Unspecified abnormalities of gait and mobility: Secondary | ICD-10-CM

## 2020-10-29 DIAGNOSIS — R278 Other lack of coordination: Secondary | ICD-10-CM | POA: Diagnosis not present

## 2020-10-29 NOTE — Telephone Encounter (Signed)
LM for Natalie [name] stating that she is correct that she is to resume the blood thinners 2 days after her surgery which would be Saturday 11-03-20. She can call the office at [phone] if she has any further questions or concerns.

## 2020-10-29 NOTE — Therapy (Signed)
[place] MAIN [facility] SERVICES [address] [place], [place], [address] Phone: [phone]   Fax:  [phone]  Physical Therapy Treatment  Patient Details  Name: Natalie [name] MRN: [id number] Date of Birth: 04/19/1950 Referring Provider (PT): [name], MD   Encounter Date: 10/29/2020   PT End of Session - 10/29/20 2058     Visit Number 22    Number of Visits 35    Date for PT Re-Evaluation 11/08/20    Authorization Type Traditional Medicare with [employer] Supplemental: VL based on certification    Authorization Time Period 08/14/20-11/08/20    PT Start Time 1300    PT Stop Time 1344    PT Time Calculation (min) 44 min    Equipment Utilized During Treatment Gait belt    Activity Tolerance Patient tolerated treatment well    Behavior During Therapy [facility] for tasks assessed/performed             Past Medical History:  Diagnosis Date   Acute bronchospasm due to viral infection 2020   Due to pollen   Anemia    history of   CVA (cerebral vascular accident) ([place]) 02/28/2020   and 04/19/20   Depressive disorder    Epistaxis    Fatigue    Hearing loss    History of loop recorder    Hypothyroidism    Memory change    Migraine    Obesity (BMI 30.0-34.9)    lost 60 lbs   Osteoarthritis    Osteoporosis    Polyuria    Premature menopause    Seizures ([place]) 1994   history of mini seizures, possible migraine induced    Past Surgical History:  Procedure Laterality Date   ABLATION  2006   BUBBLE STUDY  04/23/2020   Procedure: BUBBLE STUDY;  Surgeon: Larey Dresser, MD;  Location: [place];  Service: Cardiovascular;;   CATARACT EXTRACTION W/ INTRAOCULAR LENS IMPLANT Bilateral    COLONOSCOPY     COLONOSCOPY WITH PROPOFOL N/A 12/22/2018   Procedure: COLONOSCOPY WITH PROPOFOL;  Surgeon: Virgel Manifold, MD;  Location: [facility] ENDOSCOPY;  Service: Endoscopy;  Laterality: N/A;   EYE SURGERY  Spring 2018   Cataracts both  eyes   LOOP RECORDER INSERTION N/A 04/23/2020   Procedure: LOOP RECORDER INSERTION;  Surgeon: Evans Lance, MD;  Location: [facility] CV LAB;  Service: Cardiovascular;  Laterality: N/A;   TEE WITHOUT CARDIOVERSION N/A 04/23/2020   Procedure: TRANSESOPHAGEAL ECHOCARDIOGRAM (TEE);  Surgeon: Larey Dresser, MD;  Location: [facility] ENDOSCOPY;  Service: Cardiovascular;  Laterality: N/A;   TONSILLECTOMY      There were no vitals filed for this visit.   Subjective Assessment - 10/29/20 1259     Subjective Patient reports feeling pretty good and ready for upcoming surgery later this week. Denies any pain or falls since last visit.    Pertinent History Pt confirms the following hx is accurate (initially from acute care PT eval): "the pt is a 70 yo female presenting 3/17 with confusion and difficulty word-finding. Imaging revealed L MCA infarcts with nearly occlusive thrombus in proximal M2 and M3." PMH includes the following: CVA in Jan 2022 (for further details see ACUTE PT eval in EMR from 02/29/2020), anxiety, hypothyroidism, depression, OA, osteoporosis, memory change, migraine, seizures, hearing loss.    Limitations Standing;Walking;Lifting;House hold activities;Writing    How long can you stand comfortably? Pt reports she doesn't like to stand still for very long    How long  can you walk comfortably? Difficulty with balance when walking over uneven ground, but states she is able to walk a mile. She says sometimes she gets tired walking and will sit down. She feels overall she's been fairly energetic.    Diagnostic tests per chart: "[place] 04/19/20: MR BRAIN IMPRESSION:  1. Patchy small volume acute ischemic nonhemorrhagic posterior left  MCA distribution infarcts, likely reflecting an embolic shower. No  associated hemorrhage or mass effect.  2. No other acute intracranial abnormality.  3. Underlying mild chronic microvascular ischemic disease.; 3/17 CT ANGIO HEAD NECK:  IMPRESSION:  Nearly occlusive  thrombus at the bifurcation of a proximal M2 MCA  branch approximately 6 mm from the MCA bifurcation. Subsequent  occlusion of a proximal left M3 MCA branch. No stenosis in the neck."    Patient Stated Goals Pt says she wants to improve her balance and does not want to fall again    Currently in Pain? No/denies             INTERVENTIONS:   Neuromuscular re-education: All activities performed at balance station with railing.  Dynamic Lunge squat (minimal) - starting with Both feet on Blue airex pad and lunge forward onto 6" block.  Initially with 1 UE support - progressed to hand just hovering over bar x 12 reps each LE x 2 sets  Dynamic squat on blue airex pad without UE Support at balance station x 12 reps x 2 sets- Mild difficulty  with intermittent reaching for UE support  Dynamic marching without UE support x 12 reps x 2 sets   Calf raises on 1/2 foam roll without UE support x 12 reps.  Retro lean requiring close CGA. Then 12 reps calf raises from firm surface and No UE support. Able to hold 2 sec and perform well without retro lean.    Education provided throughout session via VC/TC and demonstration to facilitate movement at target joints and correct muscle activation for all testing and exercises performed.   Clinical Impression: Patient required brief rest break to complete balance activities and was challenged today with more dynamic activities. Patient was frustrated at times but worked to improve with each activity exhibiting improved balance with reps and cueing for technique. She is scheduled to have surgery later this week and will not be in for over 1 week. She will need reassessment next visit. Pt will benefit from further skilled PT to continue to improve strength, gait and balance to decrease fall risk                        PT Education - 10/29/20 2058     Education Details Exercise technique: POC with upcoming surgery.    Person(s) Educated  Patient    Methods Explanation;Demonstration;Tactile cues;Verbal cues    Comprehension Verbalized understanding;Returned demonstration;Verbal cues required;Need further instruction              PT Short Term Goals - 10/16/20 1322       PT SHORT TERM GOAL #1   Title Pt will be independent with HEP in order to improve strength and balance in order to decrease fall risk and improve function at home and work.    Baseline 4/18: to be initiated 7/6: HEP compliant, new HEP given 9/13: compliant    Time 6    Period Weeks    Status Achieved    Target Date 10/04/20  PT Long Term Goals - 10/16/20 1323       PT LONG TERM GOAL #1   Title Pt will improve DGI by at least 3 points in order to demonstrate clinically significant improvement in balance and decreased risk for falls.    Baseline 4/18: 18/24; 7/12: unstable BP, defer to next visit 9/13: 18/24    Time 12    Period Weeks    Status On-going    Target Date 11/08/20      PT LONG TERM GOAL #2   Title Pt will improve ABC scale by at least 13% in order to demonstrate clinically significant improvement in balance confidence.    Baseline 4/18: 63.75% 7/6: 74% 9/13: 78%    Time 12    Period Weeks    Status Partially Met    Target Date 11/08/20      PT LONG TERM GOAL #3   Title Pt will demonstrate improved ability to maintain balance on compliant surface with EC for at least 5 seconds to indicate improved ability to navigate uneven surfaces and ambulate in dimly lit rooms.    Baseline 4/18: Pt unable to maintain balance with EC on foam on CTSIB-M 7/6: met    Time 12    Period Weeks    Status Achieved      PT LONG TERM GOAL #4   Title Patient will increase BLE gross strength to 4+/5 as to improve functional strength for independent gait, increased standing tolerance and increased ADL ability.    Baseline 4/18: gross BLE strength currnelty 4/5 7/6: hips 4+/5 ankles 4/5 9/13: grossly 4+/5    Time 12    Period Weeks     Status Achieved      PT LONG TERM GOAL #5   Title Patient will increase FOTO score to equal to or greater than 80 to demonstrate improvement in mobility and quality of life.    Baseline 4/18: 74% 7/6: 59%; 7/21: 65 9/13: 68%    Time 12    Period Weeks    Status On-going    Target Date 11/08/20      PT LONG TERM GOAL #6   Title Patient will reduce dizziness handicap inventory score to <10, for less dizziness with ADLs and increased safety with home and work tasks.    Baseline 4/27: DHI score is 16, indicating mild handicap 7/6: 22% 9/13: 20%    Time 11    Period Weeks    Status Partially Met    Target Date 11/08/20                   Plan - 10/29/20 2059     Clinical Impression Statement Patient required brief rest break to complete balance activities and was challenged today with more dynamic activities. Patient was frustrated at times but worked to improve with each activity exhibiting improved balance with reps and cueing for technique. She is scheduled to have surgery later this week and will not be in for over 1 week. She will need reassessment next visit. Pt will benefit from further skilled PT to continue to improve strength, gait and balance to decrease fall risk    Personal Factors and Comorbidities Age;Comorbidity 1;Comorbidity 2;Past/Current Experience;Sex;Social Background;Behavior Pattern;Comorbidity 3+    Comorbidities CVA in Jan 2022, anxiety, hypothyroidism, depression, OA, osteoporosis, memory change, migraine, seizures, hearing loss.    Examination-Activity Limitations Bathing;Stairs;Stand;Hygiene/Grooming;Locomotion Level;Carry;Bed Mobility;Other;Reach Overhead;Lift    Examination-Participation Restrictions Community Activity;Yard Work;Shop;Cleaning;Laundry;Medication Management;Meal Prep    Stability/Clinical Decision Making  Evolving/Moderate complexity    Rehab Potential Good    PT Frequency 2x / week    PT Duration 12 weeks    PT Treatment/Interventions  ADLs/Self Care Home Management;Biofeedback;Canalith Repostioning;Cryotherapy;Electrical Stimulation;Aquatic Therapy;Moist Heat;Ultrasound;DME Instruction;Gait training;Stair training;Functional mobility training;Therapeutic activities;Therapeutic exercise;Balance training;Neuromuscular re-education;Cognitive remediation;Patient/family education;Orthotic Fit/Training;Manual techniques;Passive range of motion;Energy conservation;Taping;Splinting;Vestibular;Visual/perceptual remediation/compensation;Joint Manipulations    PT Next Visit Plan dynamic balance tasks with head turns, balance with EC    PT Home Exercise Plan Access Code: [email or web address], added:  Access Code: [email or web address], no updates    Consulted and Agree with Plan of Care Patient             Patient will benefit from skilled therapeutic intervention in order to improve the following deficits and impairments:  Abnormal gait, Decreased balance, Decreased endurance, Difficulty walking, Decreased range of motion, Dizziness, Improper body mechanics, Impaired vision/preception, Decreased coordination, Decreased strength, Decreased mobility, Obesity, Decreased activity tolerance, Decreased knowledge of use of DME, Impaired flexibility, Postural dysfunction  Visit Diagnosis: Abnormality of gait and mobility  Difficulty in walking, not elsewhere classified  Muscle weakness (generalized)  Unsteadiness on feet     Problem List Patient Active Problem List   Diagnosis Date Noted   Thickened endometrium 08/13/2020   Acute ITP ([place]) 05/02/2020   Other intra-abdominal and pelvic swelling, mass and lump 04/26/2020   Pelvic mass in female 04/25/2020   Ovarian cancer screening 04/25/2020   Ovarian mass, left 04/25/2020   Stroke (cerebrum) ([place]) 04/20/2020   Acute CVA (cerebrovascular accident) ([place]) 04/19/2020   CVA (cerebral vascular accident) ([place]) 04/19/2020   History of ischemic multifocal posterior circulation stroke 02/29/2020    Thrombocytopenia ([place]) 02/28/2020   HPV test positive 07/08/2015   Difficulty hearing 06/28/2015   Arthritis, degenerative 06/28/2015   Obesity, Class I, BMI 30-34.9 06/28/2015   Paresthesia 06/28/2015   Purpura, nonthrombopenic ([place]) 06/28/2015   Other specified hypothyroidism 08/09/2014   Migraine without aura and without status migrainosus, not intractable 09/19/2008   Moderate major depression ([place]) 09/07/2006   OP (osteoporosis) 09/07/2006    Lewis Moccasin, PT 10/30/2020, 3:49 PM  [place] MAIN [facility] SERVICES [address] [place], [place], [address] Phone: [phone]   Fax:  [phone]  Name: Natalie [name] MRN: [id number] Date of Birth: Nov 24, 1950

## 2020-10-30 ENCOUNTER — Encounter: Payer: Self-pay | Admitting: Gynecologic Oncology

## 2020-10-31 ENCOUNTER — Telehealth: Payer: Self-pay

## 2020-10-31 NOTE — Anesthesia Preprocedure Evaluation (Addendum)
Anesthesia Evaluation  Patient identified by MRN, date of birth, ID band Patient awake    Reviewed: Allergy & Precautions, NPO status , Patient's Chart, lab work & pertinent test results  Airway Mallampati: II  TM Distance: >3 FB Neck ROM: Full    Dental no notable dental hx.    Pulmonary neg pulmonary ROS, former smoker,    Pulmonary exam normal breath sounds clear to auscultation       Cardiovascular negative cardio ROS Normal cardiovascular exam Rhythm:Regular Rate:Normal     Neuro/Psych CVA negative psych ROS   GI/Hepatic negative GI ROS, Neg liver ROS,   Endo/Other  negative endocrine ROS  Renal/GU negative Renal ROS  negative genitourinary   Musculoskeletal negative musculoskeletal ROS (+)   Abdominal   Peds negative pediatric ROS (+)  Hematology negative hematology ROS (+)   Anesthesia Other Findings   Reproductive/Obstetrics negative OB ROS                             Anesthesia Physical Anesthesia Plan  ASA: 3  Anesthesia Plan: General   Post-op Pain Management:    Induction: Intravenous  PONV Risk Score and Plan: 3 and Ondansetron, Dexamethasone and Treatment may vary due to age or medical condition  Airway Management Planned: LMA  Additional Equipment:   Intra-op Plan:   Post-operative Plan: Extubation in OR  Informed Consent: I have reviewed the patients History and Physical, chart, labs and discussed the procedure including the risks, benefits and alternatives for the proposed anesthesia with the patient or authorized representative who has indicated his/her understanding and acceptance.     Dental advisory given  Plan Discussed with: CRNA and Surgeon  Anesthesia Plan Comments:         Anesthesia Quick Evaluation

## 2020-10-31 NOTE — Telephone Encounter (Signed)
Left message for Ms. Coble requesting a return call.

## 2020-11-01 ENCOUNTER — Ambulatory Visit (HOSPITAL_COMMUNITY): Payer: Medicare Other | Admitting: Anesthesiology

## 2020-11-01 ENCOUNTER — Ambulatory Visit (HOSPITAL_COMMUNITY): Payer: Medicare Other | Admitting: Physician Assistant

## 2020-11-01 ENCOUNTER — Encounter (HOSPITAL_COMMUNITY): Payer: Self-pay | Admitting: Gynecologic Oncology

## 2020-11-01 ENCOUNTER — Ambulatory Visit: Payer: Medicare Other

## 2020-11-01 ENCOUNTER — Encounter (HOSPITAL_COMMUNITY)
Admission: RE | Disposition: A | Discharge status: Home / Self Care | Payer: Self-pay | Source: Home / Self Care | Attending: Gynecologic Oncology

## 2020-11-01 ENCOUNTER — Ambulatory Visit (HOSPITAL_COMMUNITY)
Admission: RE | Admit: 2020-11-01 | Discharge: 2020-11-01 | Disposition: A | Discharge status: Home / Self Care | Payer: Medicare Other | Attending: Gynecologic Oncology | Admitting: Gynecologic Oncology

## 2020-11-01 DIAGNOSIS — Z87891 Personal history of nicotine dependence: Secondary | ICD-10-CM | POA: Insufficient documentation

## 2020-11-01 DIAGNOSIS — Z7901 Long term (current) use of anticoagulants: Secondary | ICD-10-CM | POA: Diagnosis not present

## 2020-11-01 DIAGNOSIS — N950 Postmenopausal bleeding: Secondary | ICD-10-CM | POA: Diagnosis not present

## 2020-11-01 DIAGNOSIS — N8502 Endometrial intraepithelial neoplasia [EIN]: Secondary | ICD-10-CM | POA: Diagnosis not present

## 2020-11-01 DIAGNOSIS — R9389 Abnormal findings on diagnostic imaging of other specified body structures: Secondary | ICD-10-CM

## 2020-11-01 DIAGNOSIS — D259 Leiomyoma of uterus, unspecified: Secondary | ICD-10-CM | POA: Insufficient documentation

## 2020-11-01 DIAGNOSIS — Z79890 Hormone replacement therapy (postmenopausal): Secondary | ICD-10-CM | POA: Diagnosis not present

## 2020-11-01 DIAGNOSIS — Z7902 Long term (current) use of antithrombotics/antiplatelets: Secondary | ICD-10-CM | POA: Diagnosis not present

## 2020-11-01 DIAGNOSIS — N8500 Endometrial hyperplasia, unspecified: Secondary | ICD-10-CM | POA: Diagnosis not present

## 2020-11-01 DIAGNOSIS — N736 Female pelvic peritoneal adhesions (postinfective): Secondary | ICD-10-CM | POA: Diagnosis not present

## 2020-11-01 DIAGNOSIS — E038 Other specified hypothyroidism: Secondary | ICD-10-CM | POA: Diagnosis not present

## 2020-11-01 DIAGNOSIS — Z8673 Personal history of transient ischemic attack (TIA), and cerebral infarction without residual deficits: Secondary | ICD-10-CM | POA: Diagnosis not present

## 2020-11-01 DIAGNOSIS — Z79899 Other long term (current) drug therapy: Secondary | ICD-10-CM | POA: Diagnosis not present

## 2020-11-01 DIAGNOSIS — D696 Thrombocytopenia, unspecified: Secondary | ICD-10-CM | POA: Diagnosis not present

## 2020-11-01 HISTORY — PX: HYSTEROSCOPY WITH D & C: SHX1775

## 2020-11-01 LAB — TYPE AND SCREEN
ABO/RH(D): A POS
Antibody Screen: NEGATIVE

## 2020-11-01 SURGERY — DILATATION AND CURETTAGE /HYSTEROSCOPY
Anesthesia: General | Site: Uterus

## 2020-11-01 MED ORDER — FENTANYL CITRATE (PF) 100 MCG/2ML IJ SOLN
INTRAMUSCULAR | Status: DC | PRN
  Administered 2020-11-01: 25 ug via INTRAVENOUS
  Administered 2020-11-01: 50 ug via INTRAVENOUS
  Administered 2020-11-01: 25 ug via INTRAVENOUS

## 2020-11-01 MED ORDER — OXYCODONE HCL 5 MG PO TABS: 5.0000 mg | ORAL_TABLET | Freq: Once | ORAL | Status: DC | PRN

## 2020-11-01 MED ORDER — LIDOCAINE HCL 1 % IJ SOLN: INTRAMUSCULAR | Status: DC | PRN

## 2020-11-01 MED ORDER — EPHEDRINE SULFATE-NACL 50-0.9 MG/10ML-% IV SOSY
PREFILLED_SYRINGE | INTRAVENOUS | Status: DC | PRN
  Administered 2020-11-01: 15 mg via INTRAVENOUS
  Administered 2020-11-01: 10 mg via INTRAVENOUS

## 2020-11-01 MED ORDER — TRAMADOL HCL 50 MG PO TABS: 50.0000 mg | ORAL_TABLET | Freq: Two times a day (BID) | ORAL | 0 refills | Status: DC | PRN

## 2020-11-01 MED ORDER — PROPOFOL 10 MG/ML IV BOLUS
INTRAVENOUS | Status: AC
  Filled 2020-11-01: qty 40

## 2020-11-01 MED ORDER — ONDANSETRON HCL 4 MG/2ML IJ SOLN
INTRAMUSCULAR | Status: DC | PRN
  Administered 2020-11-01: 4 mg via INTRAVENOUS

## 2020-11-01 MED ORDER — FENTANYL CITRATE PF 50 MCG/ML IJ SOSY: 25.0000 ug | PREFILLED_SYRINGE | INTRAMUSCULAR | Status: DC | PRN

## 2020-11-01 MED ORDER — MISOPROSTOL 200 MCG PO TABS
200.0000 ug | ORAL_TABLET | Freq: Once | ORAL | Status: AC
  Administered 2020-11-01: 200 ug via ORAL
  Filled 2020-11-01: qty 1

## 2020-11-01 MED ORDER — LIDOCAINE 2% (20 MG/ML) 5 ML SYRINGE
INTRAMUSCULAR | Status: DC | PRN
  Administered 2020-11-01: 100 mg via INTRAVENOUS

## 2020-11-01 MED ORDER — LIDOCAINE HCL (PF) 1 % IJ SOLN
INTRAMUSCULAR | Status: AC
  Filled 2020-11-01: qty 30

## 2020-11-01 MED ORDER — ONDANSETRON HCL 4 MG/2ML IJ SOLN
INTRAMUSCULAR | Status: AC
  Filled 2020-11-01: qty 2

## 2020-11-01 MED ORDER — OXYCODONE HCL 5 MG/5ML PO SOLN: 5.0000 mg | Freq: Once | ORAL | Status: DC | PRN

## 2020-11-01 MED ORDER — LACTATED RINGERS IV SOLN: INTRAVENOUS | Status: DC

## 2020-11-01 MED ORDER — ONDANSETRON HCL 4 MG/2ML IJ SOLN: 4.0000 mg | Freq: Once | INTRAMUSCULAR | Status: DC | PRN

## 2020-11-01 MED ORDER — DEXAMETHASONE SODIUM PHOSPHATE 10 MG/ML IJ SOLN
INTRAMUSCULAR | Status: AC
  Filled 2020-11-01: qty 1

## 2020-11-01 MED ORDER — DEXAMETHASONE SODIUM PHOSPHATE 4 MG/ML IJ SOLN
4.0000 mg | INTRAMUSCULAR | Status: AC
  Administered 2020-11-01: 4 mg via INTRAVENOUS

## 2020-11-01 MED ORDER — ACETAMINOPHEN 500 MG PO TABS
1000.0000 mg | ORAL_TABLET | ORAL | Status: AC
  Administered 2020-11-01: 1000 mg via ORAL
  Filled 2020-11-01: qty 2

## 2020-11-01 MED ORDER — PROPOFOL 10 MG/ML IV BOLUS
INTRAVENOUS | Status: DC | PRN
  Administered 2020-11-01: 160 mg via INTRAVENOUS

## 2020-11-01 MED ORDER — MIDAZOLAM HCL 2 MG/2ML IJ SOLN
INTRAMUSCULAR | Status: DC | PRN
  Administered 2020-11-01 (×2): 1 mg via INTRAVENOUS

## 2020-11-01 MED ORDER — SODIUM CHLORIDE 0.9 % IR SOLN
Status: DC | PRN
  Administered 2020-11-01: 3000 mL

## 2020-11-01 MED ORDER — FENTANYL CITRATE (PF) 100 MCG/2ML IJ SOLN
INTRAMUSCULAR | Status: AC
  Filled 2020-11-01: qty 2

## 2020-11-01 MED ORDER — ORAL CARE MOUTH RINSE: 15.0000 mL | Freq: Once | OROMUCOSAL | Status: AC

## 2020-11-01 MED ORDER — CHLORHEXIDINE GLUCONATE 0.12 % MT SOLN
15.0000 mL | Freq: Once | OROMUCOSAL | Status: AC
  Administered 2020-11-01: 15 mL via OROMUCOSAL

## 2020-11-01 MED ORDER — MIDAZOLAM HCL 2 MG/2ML IJ SOLN
INTRAMUSCULAR | Status: AC
  Filled 2020-11-01: qty 2

## 2020-11-01 SURGICAL SUPPLY — 26 items
BAG COUNTER SPONGE SURGICOUNT (BAG) ×2 IMPLANT
BAG SPNG CNTER NS LX DISP (BAG) ×1
BIPOLAR CUTTING LOOP 21FR (ELECTRODE)
DEVICE MYOSURE LITE (MISCELLANEOUS) IMPLANT
DEVICE MYOSURE REACH (MISCELLANEOUS) ×1 IMPLANT
DILATOR CANAL MILEX (MISCELLANEOUS) IMPLANT
DRSG TELFA 3X8 NADH (GAUZE/BANDAGES/DRESSINGS) ×2 IMPLANT
GAUZE 4X4 16PLY ~~LOC~~+RFID DBL (SPONGE) ×2 IMPLANT
GLOVE SURG ENC MOIS LTX SZ6 (GLOVE) ×2 IMPLANT
GLOVE SURG ENC MOIS LTX SZ6.5 (GLOVE) IMPLANT
GOWN STRL REUS W/TWL LRG LVL3 (GOWN DISPOSABLE) ×3 IMPLANT
IV NS IRRIG 3000ML ARTHROMATIC (IV SOLUTION) ×2 IMPLANT
KIT PROCEDURE FLUENT (KITS) IMPLANT
KIT TURNOVER KIT A (KITS) ×2 IMPLANT
LOOP CUTTING BIPOLAR 21FR (ELECTRODE) IMPLANT
MYOSURE XL FIBROID (MISCELLANEOUS)
PACK VAGINAL MINOR WOMEN LF (CUSTOM PROCEDURE TRAY) ×2 IMPLANT
PAD DRESSING TELFA 3X8 NADH (GAUZE/BANDAGES/DRESSINGS) ×1 IMPLANT
PAD OB MATERNITY 4.3X12.25 (PERSONAL CARE ITEMS) ×2 IMPLANT
PAD PREP 24X48 CUFFED NSTRL (MISCELLANEOUS) ×2 IMPLANT
SCOPETTES 8  STERILE (MISCELLANEOUS) ×2
SCOPETTES 8 STERILE (MISCELLANEOUS) IMPLANT
SEAL ROD LENS SCOPE MYOSURE (ABLATOR) ×1 IMPLANT
SYSTEM TISS REMOVAL MYOSURE XL (MISCELLANEOUS) IMPLANT
TOWEL OR 17X26 10 PK STRL BLUE (TOWEL DISPOSABLE) ×2 IMPLANT
WATER STERILE IRR 500ML POUR (IV SOLUTION) ×2 IMPLANT

## 2020-11-01 NOTE — Interval H&P Note (Signed)
History and Physical Interval Note:  11/01/2020 7:03 AM  Natalie [name]  has presented today for surgery, with the diagnosis of POST MENOPAUSAL BLEEDING,THICKENED ENDOMETRIUM.  The various methods of treatment have been discussed with the patient and family. After consideration of risks, benefits and other options for treatment, the patient has consented to  Procedure(s): DILATATION AND CURETTAGE /HYSTEROSCOPY WITH MYOSURE (N/A) as a surgical intervention.  [name] history has been reviewed, patient examined, no change in status, stable for surgery.  I have reviewed the patient's chart and labs.  Questions were answered to the patient's satisfaction.     Lafonda Mosses

## 2020-11-01 NOTE — Anesthesia Postprocedure Evaluation (Signed)
Anesthesia Post Note  Patient: Natalie [name]  Procedure(s) Performed: DILATATION AND CURETTAGE /HYSTEROSCOPY WITH MYOSURE (Uterus)     Patient location during evaluation: PACU Anesthesia Type: General Level of consciousness: awake and alert Pain management: pain level controlled Vital Signs Assessment: post-procedure vital signs reviewed and stable Respiratory status: spontaneous breathing, nonlabored ventilation, respiratory function stable and patient connected to nasal cannula oxygen Cardiovascular status: blood pressure returned to baseline and stable Postop Assessment: no apparent nausea or vomiting Anesthetic complications: no   No notable events documented.  Last Vitals:  Vitals:   11/01/20 0845 11/01/20 0900  BP: 133/70   Pulse: 67 67  Resp: 13 16  Temp:    SpO2: 97% 99%    Last Pain:  Vitals:   11/01/20 0900  TempSrc:   PainSc: 0-No pain                 Jaslen Adcox S

## 2020-11-01 NOTE — Transfer of Care (Signed)
Immediate Anesthesia Transfer of Care Note  Patient: Natalie [name]  Procedure(s) Performed: DILATATION AND CURETTAGE /HYSTEROSCOPY WITH MYOSURE (Uterus)  Patient Location: PACU  Anesthesia Type:General  Level of Consciousness: awake, alert  and patient cooperative  Airway & Oxygen Therapy: Patient Spontanous Breathing and Patient connected to face mask oxygen  Post-op Assessment: Report given to RN and Post -op Vital signs reviewed and stable  Post vital signs: Reviewed and stable  Last Vitals:  Vitals Value Taken Time  BP    Temp    Pulse    Resp    SpO2      Last Pain:  Vitals:   11/01/20 0609  TempSrc:   PainSc: 0-No pain         Complications: No notable events documented.

## 2020-11-01 NOTE — Addendum Note (Signed)
Addendum  created 11/01/20 0959 by Myrtie Soman, MD   Order list changed, Order sets accessed, Pharmacy for encounter modified

## 2020-11-01 NOTE — Discharge Instructions (Addendum)
11/01/2020  Plavix and blood thinner can be restarted on 10/1.  Activity: 1. Be up and out of the bed during the day.  Take a nap if needed.  You may walk up steps but be careful and use the hand rail.  Stair climbing will tire you more than you think, you may need to stop part way and rest.   2. No lifting or straining for 2 weeks.  3. No driving until you feel safe to brake.  4. Shower daily.  No baths for 2-3 weeks.  5. No sexual activity and nothing in the vagina for 2-4 weeks.  Medications:  - Take tylenol first line for pain control. Take these regularly (every 6 hours) to decrease the build up of pain.  - If necessary, for severe pain not relieved by ibuprofen, take tramadol.  Diet: 1. Low sodium Heart Healthy Diet is recommended.  2. It is safe to use a laxative if you have difficulty moving your bowels.   Reasons to call the Doctor:  Fever - Oral temperature greater than 100.4 degrees Fahrenheit Foul-smelling vaginal discharge Difficulty urinating Nausea and vomiting Difficulty breathing with or without chest pain New calf pain especially if only on one side Sudden, continuing increased vaginal bleeding with or without clots.   Follow-up: 1. You will have a phone visit with Dr. Berline Lopes once pathology is back next week.  Contacts: For questions or concerns you should contact:  Dr. Jeral Pinch at [phone] After hours and on week-ends call [phone] and ask to speak to the physician on call for Gynecologic Oncology

## 2020-11-01 NOTE — Op Note (Signed)
OPERATIVE NOTE  PATIENT: Natalie [name] DATE: 11/01/20  Preop Diagnosis: PMB, thickened endometrium  Postoperative Diagnosis: Fibrous endometrium with thick adhesions  Surgery: Dilation, hysteroscopy, and hysteroscopic curettage  Surgeons:  Valarie Cones MD  Assistant: none  Anesthesia: LMA  Estimated blood loss: 100 ml  IVF: see I&O flowsheet   Urine output: n/a   Fluid balance: -657QI  Complications: None apparent  Pathology: endometrial curetteings  Operative findings: Somewhat bulbous enlarge uterus, smooth 6-8cm mass appreciated high within the cul de sac. No nodularity. ON hysteroscopy, atrophic and long cervix. Endometrial cavity with significant amount of fibrous tissue and adhesions on all surfaces of the cavity, minimal normal endometrium noted. All tissue atrophic and muscular in appearance. On small area of polypoid appearing tissue. No tubal ostia definitively seen.   Procedure: The patient was identified in the preoperative holding area. Informed consent was signed on the chart. Patient was seen history was reviewed and exam was performed.   The patient was then taken to the operating room and placed in the supine position with SCD hose on. General anesthesia was then induced without difficulty. She was then placed in the dorsolithotomy position. The perineum was prepped with Betadine. The vagina was prepped with Betadine. The patient was then draped after the prep was dried.   Timeout was performed the patient, procedure, antibiotic, allergy, and length of procedure.   The speculum was placed in the vagina. The single tooth tenaculum was placed on the anterior lip of the cervix. The uterine sound was placed in the cervix and advanced to the fundus, approximately 8cm. The cervix was successively dilated using pratts dilators to 78F. The Myosure hysteroscope was then advanced into the endometrial cavity where pictures were taken.   Under direct  visualization, all sides of the cavity were sampled using the Myosure Reach. Endometrial sampling was collected in the Myosure sock and sent for permanent pathology.  The hysteroscope was removed. The tenaculum was removed and a tear in the anterior cervix was noted. This was repaired using a running locked stitch of 2-0 Vicryl.   Given increased uterine bleeding immediately after the procedure, 272mcg of misoprostol was placed rectally. Uterine bleeding quickly decreased. The vagina was irrigated.   All instrument, suture, laparotomy, Ray-Tec, and needle counts were correct x2. The patient tolerated the procedure well and was taken recovery room in stable condition.   Lafonda Mosses, MD

## 2020-11-01 NOTE — Anesthesia Procedure Notes (Signed)
Procedure Name: LMA Insertion Date/Time: 11/01/2020 7:25 AM Performed by: Eben Burow, CRNA Pre-anesthesia Checklist: Patient identified, Emergency Drugs available, Suction available, Patient being monitored and Timeout performed Patient Re-evaluated:Patient Re-evaluated prior to induction Oxygen Delivery Method: Circle system utilized Preoxygenation: Pre-oxygenation with 100% oxygen Induction Type: IV induction Ventilation: Mask ventilation without difficulty LMA: LMA inserted LMA Size: 4.0 Number of attempts: 1 Tube secured with: Tape Dental Injury: Teeth and Oropharynx as per pre-operative assessment

## 2020-11-02 ENCOUNTER — Telehealth: Payer: Self-pay

## 2020-11-02 ENCOUNTER — Encounter: Payer: Self-pay | Admitting: Gynecologic Oncology

## 2020-11-02 ENCOUNTER — Encounter (HOSPITAL_COMMUNITY): Payer: Self-pay | Admitting: Gynecologic Oncology

## 2020-11-02 LAB — SURGICAL PATHOLOGY

## 2020-11-02 NOTE — Telephone Encounter (Signed)
Spoke with Natalie [name] this morning. She states she is eating, drinking and urinating well. She has not had a BM yet,encouraged her to drink plenty of water and take a stool softener or miralax if needed. She denies fever or chills.  She states she has not needed to take anything for pain and did not fill her prescription for tramadol.  Instructed to call office with any fever, chills, purulent drainage, uncontrolled pain or any other questions or concerns. Patient verbalizes understanding.   Pt aware of post op appointments as well as the office number [phone] and after hours number [phone] to call if she has any questions or concerns

## 2020-11-05 ENCOUNTER — Other Ambulatory Visit: Payer: Self-pay | Admitting: Gynecologic Oncology

## 2020-11-05 ENCOUNTER — Encounter: Payer: Self-pay | Admitting: Gynecologic Oncology

## 2020-11-05 ENCOUNTER — Inpatient Hospital Stay: Payer: Medicare Other | Attending: Hematology and Oncology | Admitting: Gynecologic Oncology

## 2020-11-05 DIAGNOSIS — N8502 Endometrial intraepithelial neoplasia [EIN]: Secondary | ICD-10-CM

## 2020-11-05 DIAGNOSIS — N838 Other noninflammatory disorders of ovary, fallopian tube and broad ligament: Secondary | ICD-10-CM

## 2020-11-05 DIAGNOSIS — N950 Postmenopausal bleeding: Secondary | ICD-10-CM

## 2020-11-05 DIAGNOSIS — R9389 Abnormal findings on diagnostic imaging of other specified body structures: Secondary | ICD-10-CM

## 2020-11-05 MED ORDER — MEDROXYPROGESTERONE ACETATE 10 MG PO TABS: 10.0000 mg | ORAL_TABLET | Freq: Every day | ORAL | 3 refills | Status: DC

## 2020-11-05 NOTE — Progress Notes (Signed)
Gynecologic Oncology Telehealth Consult Note: Gyn-Onc  I connected with Natalie [name] on 11/05/20 at  3:45 PM EDT by telephone and verified that I am speaking with the correct person using two identifiers.  I discussed the limitations, risks, security and privacy concerns of performing an evaluation and management service by telemedicine and the availability of in-person appointments. I also discussed with the patient that there may be a patient responsible charge related to this service. The patient expressed understanding and agreed to proceed.  Other persons participating in the visit and their role in the encounter: None.  Patient's location: Home Provider's location: [facility]  Reason for Visit: Follow-up after recent surgery, treatment planning  Interval History: Patient underwent exam under anesthesia and cervical dilation with endometrial sampling using the MyoSure device last week.  Pathology returned showing complex atypical hyperplasia, could not rule out well differentiated endometrial adenocarcinoma.  Patient notes doing well after surgery.  She has had some vaginal bleeding, denies any pelvic or abdominal pain.  Reports baseline bowel and bladder function.  Past Medical/Surgical History: Past Medical History:  Diagnosis Date   Acute bronchospasm due to viral infection 2020   Due to pollen   Anemia    history of   CVA (cerebral vascular accident) ([place]) 02/28/2020   and 04/19/20   Depressive disorder    Epistaxis    Fatigue    Hearing loss    History of loop recorder    Hypothyroidism    Memory change    Migraine    Obesity (BMI 30.0-34.9)    lost 60 lbs   Osteoarthritis    Osteoporosis    Polyuria    Premature menopause    Seizures ([place]) 1994   history of mini seizures, possible migraine induced    Past Surgical History:  Procedure Laterality Date   ABLATION  2006   BUBBLE STUDY  04/23/2020   Procedure: BUBBLE STUDY;  Surgeon: Larey Dresser, MD;  Location: [place];  Service: Cardiovascular;;   CATARACT EXTRACTION W/ INTRAOCULAR LENS IMPLANT Bilateral    COLONOSCOPY     COLONOSCOPY WITH PROPOFOL N/A 12/22/2018   Procedure: COLONOSCOPY WITH PROPOFOL;  Surgeon: Virgel Manifold, MD;  Location: [facility] ENDOSCOPY;  Service: Endoscopy;  Laterality: N/A;   EYE SURGERY  Spring 2018   Cataracts both eyes   HYSTEROSCOPY WITH D & C N/A 11/01/2020   Procedure: DILATATION AND CURETTAGE /HYSTEROSCOPY WITH MYOSURE;  Surgeon: Lafonda Mosses, MD;  Location: [facility];  Service: Gynecology;  Laterality: N/A;   LOOP RECORDER INSERTION N/A 04/23/2020   Procedure: LOOP RECORDER INSERTION;  Surgeon: Evans Lance, MD;  Location: [place] [facility];  Service: Cardiovascular;  Laterality: N/A;   TEE WITHOUT CARDIOVERSION N/A 04/23/2020   Procedure: TRANSESOPHAGEAL ECHOCARDIOGRAM (TEE);  Surgeon: Larey Dresser, MD;  Location: [facility] ENDOSCOPY;  Service: Cardiovascular;  Laterality: N/A;   TONSILLECTOMY      Family History  Problem Relation Age of Onset   Early death Father        suicide   Depression Father    Diabetes Father    Cancer Father        prostate   Hearing loss Father        due to war   Alzheimer's disease Mother    Heart disease Brother    Atrial fibrillation Brother    Heart disease Maternal Aunt    Dementia Maternal Aunt    Heart disease Maternal Uncle  Dementia Maternal Grandmother    Heart disease Brother    Atrial fibrillation Brother    Colon cancer Neg Hx    Breast cancer Neg Hx    Ovarian cancer Neg Hx    Pancreatic cancer Neg Hx    Endometrial cancer Neg Hx     Social History   Socioeconomic History   Marital status: Single    Spouse name: Not on file   Number of children: 1   Years of education: Not on file   Highest education level: Bachelor's degree (e.g., BA, AB, BS)  Occupational History   Occupation: retired   Tobacco Use   Smoking status: Former    Packs/day: 0.50    Years:  6.00    Pack years: 3.00    Types: Cigarettes    Quit date: 02/11/1974    Years since quitting: 46.7   Smokeless tobacco: Never  Vaping Use   Vaping Use: Never used  Substance and Sexual Activity   Alcohol use: Yes    Alcohol/week: 0.0 standard drinks    Comment: 2-3 drinks monthly   Drug use: Never   Sexual activity: Not Currently    Comment: Don't use not sexually active  Other Topics Concern   Not on file  Social History Narrative   Raised an adopted child on her own   Working part time reviewed documents   Social Determinants of Radio broadcast assistant Strain: Low Risk    Difficulty of Paying Living Expenses: Not hard at all  Food Insecurity: No Food Insecurity   Worried About Charity fundraiser in the Last Year: Never true   Arboriculturist in the Last Year: Never true  Transportation Needs: No Transportation Needs   Lack of Transportation (Medical): No   Lack of Transportation (Non-Medical): No  Physical Activity: Inactive   Days of Exercise per Week: 0 days   Minutes of Exercise per Session: 0 min  Stress: No Stress Concern Present   Feeling of Stress : Not at all  Social Connections: Moderately Integrated   Frequency of Communication with Friends and Family: More than three times a week   Frequency of Social Gatherings with Friends and Family: Twice a week   Attends Religious Services: More than 4 times per year   Active Member of [employer] or Organizations: Yes   Attends Music therapist: More than 4 times per year   Marital Status: Never married    Current Medications:  Current Outpatient Medications:    atorvastatin (LIPITOR) 40 MG tablet, Take 1 tablet (40 mg total) by mouth daily., Disp: 90 tablet, Rfl: 1   buPROPion (WELLBUTRIN SR) 150 MG 12 hr tablet, TAKE 2 TABLETS BY MOUTH EVERY DAY (Patient taking differently: Take 150 mg by mouth 2 (two) times daily.), Disp: 180 tablet, Rfl: 1   citalopram (CELEXA) 40 MG tablet, TAKE 1 TABLET BY MOUTH  EVERY DAY, Disp: 90 tablet, Rfl: 1   clindamycin-benzoyl peroxide (BENZACLIN) gel, Apply topically 2 (two) times daily. (Patient taking differently: Apply 1 application topically 2 (two) times daily as needed (facial acne).), Disp: 50 g, Rfl: 0   clopidogrel (PLAVIX) 75 MG tablet, Take 1 tablet (75 mg total) by mouth daily., Disp: 30 tablet, Rfl: 1   Cyanocobalamin (VITAMIN B-12) 1000 MCG SUBL, Place 1,000 mcg under the tongue at bedtime., Disp: , Rfl:    levothyroxine (SYNTHROID) 75 MCG tablet, Take 1 tablet (75 mcg total) by mouth daily before breakfast., Disp: 90  tablet, Rfl: 1   medroxyPROGESTERone (PROVERA) 10 MG tablet, Take 1 tablet (10 mg total) by mouth daily for 120 doses., Disp: 30 tablet, Rfl: 3   Multiple Vitamin (MULTIVITAMIN WITH MINERALS) TABS tablet, Take 1 tablet by mouth daily., Disp: , Rfl:    polyvinyl alcohol (LIQUIFILM TEARS) 1.4 % ophthalmic solution, Place 1 drop into both eyes daily as needed for dry eyes., Disp: , Rfl:    rivaroxaban (XARELTO) 20 MG TABS tablet, Take 20 mg by mouth daily with supper., Disp: , Rfl:    traMADol (ULTRAM) 50 MG tablet, Take 1 tablet (50 mg total) by mouth every 12 (twelve) hours as needed for up to 5 doses., Disp: 5 tablet, Rfl: 0   Ubrogepant (UBRELVY) 100 MG TABS, Take 100 mg by mouth., Disp: , Rfl:    Vitamin D, Ergocalciferol, (DRISDOL) 1.25 MG (50000 UNIT) CAPS capsule, TAKE 1 CAPSULE BY MOUTH EVERY 7 DAYS (Patient taking differently: Take 50,000 Units by mouth every Monday. TAKE 1 CAPSULE BY MOUTH EVERY 7 DAYS), Disp: 12 capsule, Rfl: 1  Review of Symptoms: Pertinent positives as per HPI.  Physical Exam: There were no vitals taken for this visit. Deferred given limitations of phone visit.  Laboratory & Radiologic Studies: A. ENDOMETRIUM, CURETTAGE:  - Atypical hyperplasia, see comment   COMMENT:   Focal well differentiated adenocarcinoma cannot be entirely ruled out.  Dr. Melina Copa reviewed the case and concurs with the  diagnosis.  Assessment & Plan: Natalie [name] is a 70 y.o. woman with complex medical history who has been followed for a adnexal cyst but developed endometrial thickening on last imaging now status post endometrial sampling with pathology revealing at least complex atypical hyperplasia.  Patient is doing well from a postoperative standpoint.  We discussed pathology results.  I was somewhat surprised given appearance of endometrial cavity.  I feel that I got a good amount of sampling of the entire cavity.  We will still need to proceed weighing the risks and benefits of surgical risk in the setting of recent stroke history with cancer risk.  The patient had previously been on progesterone.  The amount of progesterone in the setting of CAH could be continued with the Provera that she had been using.  In the setting of endometrial adenocarcinoma, we would use a higher dose of Provera or Megace.  I worry a little about her increased risk of VTE with high-dose progesterone therapy.  We discussed getting a pelvic ultrasound if our goal is to delay definitive surgery 2-3 months to increase safety from a stroke history standpoint.  We will plan to get this in the next couple of weeks and I will have an in person visit with her after to review and make a final plan regarding progesterone dose, treatment time, and plan for ultimately definitive surgery.  I discussed the assessment and treatment plan with the patient. The patient was provided with an opportunity to ask questions and all were answered. The patient agreed with the plan and demonstrated an understanding of the instructions.   The patient was advised to call back or see an in-person evaluation if the symptoms worsen or if the condition fails to improve as anticipated.   28 minutes of total time was spent for this patient encounter, including preparation, face-to-face counseling with the patient and coordination of care, and documentation of the  encounter.   Jeral Pinch, MD  Division of Gynecologic Oncology  Department of Obstetrics and Gynecology  [facility]

## 2020-11-06 ENCOUNTER — Ambulatory Visit: Payer: Medicare Other

## 2020-11-07 ENCOUNTER — Telehealth: Payer: Self-pay | Admitting: *Deleted

## 2020-11-07 ENCOUNTER — Telehealth: Payer: Medicare Other | Admitting: Gynecologic Oncology

## 2020-11-07 ENCOUNTER — Encounter: Payer: Self-pay | Admitting: Gynecologic Oncology

## 2020-11-07 NOTE — Telephone Encounter (Signed)
Called and scheduled the patient for an [place] scan and MD visit. Called and left the patient a message to call the office back.

## 2020-11-07 NOTE — Telephone Encounter (Signed)
Patient called back and was given the appt date and times

## 2020-11-08 ENCOUNTER — Ambulatory Visit: Payer: Medicare Other

## 2020-11-13 ENCOUNTER — Encounter: Payer: Self-pay | Admitting: Gynecologic Oncology

## 2020-11-13 ENCOUNTER — Ambulatory Visit: Payer: Medicare Other

## 2020-11-14 ENCOUNTER — Telehealth: Payer: Self-pay

## 2020-11-14 NOTE — Telephone Encounter (Signed)
Following up with Natalie [name] regarding her mychart message about vaginal bleeding. Per Dr. Berline Lopes patient is to double the dose of progesterone until next appointment on 11/21/20. Prescription is for 10mg  progesterone daily. Patient will now take 20mg  progesterone daily. Reviewed DVT signs with patient. Instructed to seek care immediately at ER  if she notices pain, swelling or redness on the back of her leg(s) or if she has chest pain or shortness of breath. Patient verbalized understanding and will call with any questions or concerns.

## 2020-11-15 ENCOUNTER — Ambulatory Visit: Payer: Medicare Other

## 2020-11-15 ENCOUNTER — Telehealth: Payer: Self-pay | Admitting: *Deleted

## 2020-11-15 ENCOUNTER — Other Ambulatory Visit: Payer: Self-pay | Admitting: Gynecologic Oncology

## 2020-11-15 DIAGNOSIS — M79604 Pain in right leg: Secondary | ICD-10-CM

## 2020-11-15 DIAGNOSIS — M7989 Other specified soft tissue disorders: Secondary | ICD-10-CM

## 2020-11-15 NOTE — Telephone Encounter (Signed)
Patient called and stated "I talked with the nurse yesterday and Dr Berline Lopes had me double my medicine dose until 10/19. The nurse discuss the DVT concerns. I'm having right calf swelling and soreness. It is red and warm to the touch. It started last night. I can walk and bear wight with no pain." Explained that the message would be given to [facility] APP and Dr Berline Lopes; someone from he office will call her back. Patient stated that she can go to [facility] for scan if needed.

## 2020-11-15 NOTE — Telephone Encounter (Signed)
Per [facility] APP scheduled doppler. Called the patient back and gave the date/time for appt. Patient will call the office back tomorrow about how she is feeling.

## 2020-11-15 NOTE — Progress Notes (Signed)
See phone note from today. Patient called with symptoms of right calf swelling, pain, erythema, and increased warmth. Will plan to order doppler to rule out DVT.

## 2020-11-16 ENCOUNTER — Ambulatory Visit (HOSPITAL_COMMUNITY)
Admission: RE | Admit: 2020-11-16 | Discharge: 2020-11-16 | Disposition: A | Discharge status: Home / Self Care | Payer: Medicare Other | Source: Ambulatory Visit | Attending: Gynecologic Oncology | Admitting: Gynecologic Oncology

## 2020-11-16 ENCOUNTER — Encounter: Payer: Self-pay | Admitting: Gynecologic Oncology

## 2020-11-16 ENCOUNTER — Telehealth: Payer: Self-pay

## 2020-11-16 ENCOUNTER — Other Ambulatory Visit: Payer: Self-pay

## 2020-11-16 DIAGNOSIS — M7989 Other specified soft tissue disorders: Secondary | ICD-10-CM | POA: Diagnosis not present

## 2020-11-16 DIAGNOSIS — M79604 Pain in right leg: Secondary | ICD-10-CM | POA: Diagnosis not present

## 2020-11-16 DIAGNOSIS — N939 Abnormal uterine and vaginal bleeding, unspecified: Secondary | ICD-10-CM

## 2020-11-16 LAB — CBC
HCT: 37.4 % (ref 36.0–46.0)
Hemoglobin: 12.4 g/dL (ref 12.0–15.0)
MCH: 31.4 pg (ref 26.0–34.0)
MCHC: 33.2 g/dL (ref 30.0–36.0)
MCV: 94.7 fL (ref 80.0–100.0)
Platelets: 197 10*3/uL (ref 150–400)
RBC: 3.95 MIL/uL (ref 3.87–5.11)
RDW: 11.9 % (ref 11.5–15.5)
WBC: 7.4 10*3/uL (ref 4.0–10.5)
nRBC: 0 % (ref 0.0–0.2)

## 2020-11-16 NOTE — Progress Notes (Signed)
Lower extremity venous has been completed.   Preliminary results in CV Proc.   Natalie [name] 11/16/2020 4:25 PM

## 2020-11-16 NOTE — Telephone Encounter (Signed)
Received call from Ms. Costello, patient reports having diarrhea this morning and had a gush of bright red blood. "The toilet has a lot of bright red blood and clots in it." She states it is coming from her vagina and not her rectum. She reports bleeding has now slowed down. She states she is going for her doppler today but is concerned about the bleeding. She is taking 20mg  medroxyprogesterone daily. MD notified. Per Dr. Berline Lopes we can check her labs today when she comes for her doppler. Patient verbalizes understanding and will go to the [facility] lab before her doppler appt. Patient states "I'm afraid to get off the toilet. I'm not light headed right now but afraid I might be when I stand up." Advised patient to have help when getting up. She reports being home alone but has 2 close friends that she can call for help. Advised if unable to contact them to call EMS for help. Patient verbalized understanding.

## 2020-11-19 ENCOUNTER — Telehealth: Payer: Self-pay

## 2020-11-19 NOTE — Telephone Encounter (Signed)
Returning call to Ms. Pentland regarding vaginal bleeding over the weekend. Patient reports "It was horrible.  It was worse than before. I sat on the toilet for 20 minutes, it was much heavier than last time. I passed a very large blood clot, about 5 inches." She states the blood was a blackish red color.  Patient states she called the triage line over the weekend who advised her to go to the ER if she has another bleeding episode, has lightheadedness or dizziness. Patient felt she did not need to go to the ER. She reports feeling better today. Bleeding has slowed, she denies being lightheaded or dizzy. MD notified. Dr. Berline Lopes reassured with recent lab work, will see patient on 10/19. Patient verbalized understanding and will call with any questions or concerns.

## 2020-11-20 ENCOUNTER — Other Ambulatory Visit: Payer: Self-pay

## 2020-11-20 ENCOUNTER — Ambulatory Visit: Payer: Medicare Other | Attending: Family Medicine

## 2020-11-20 DIAGNOSIS — R269 Unspecified abnormalities of gait and mobility: Secondary | ICD-10-CM | POA: Insufficient documentation

## 2020-11-20 DIAGNOSIS — M6281 Muscle weakness (generalized): Secondary | ICD-10-CM | POA: Insufficient documentation

## 2020-11-20 DIAGNOSIS — R2681 Unsteadiness on feet: Secondary | ICD-10-CM | POA: Diagnosis not present

## 2020-11-20 DIAGNOSIS — R262 Difficulty in walking, not elsewhere classified: Secondary | ICD-10-CM | POA: Insufficient documentation

## 2020-11-20 DIAGNOSIS — R278 Other lack of coordination: Secondary | ICD-10-CM | POA: Diagnosis not present

## 2020-11-20 DIAGNOSIS — R2689 Other abnormalities of gait and mobility: Secondary | ICD-10-CM | POA: Diagnosis not present

## 2020-11-21 ENCOUNTER — Other Ambulatory Visit: Payer: Self-pay | Admitting: Family Medicine

## 2020-11-21 ENCOUNTER — Ambulatory Visit (HOSPITAL_COMMUNITY)
Admission: RE | Admit: 2020-11-21 | Discharge: 2020-11-21 | Disposition: A | Discharge status: Home / Self Care | Payer: Medicare Other | Source: Ambulatory Visit | Attending: Gynecologic Oncology | Admitting: Gynecologic Oncology

## 2020-11-21 ENCOUNTER — Ambulatory Visit: Payer: Medicare Other

## 2020-11-21 ENCOUNTER — Inpatient Hospital Stay (HOSPITAL_BASED_OUTPATIENT_CLINIC_OR_DEPARTMENT_OTHER): Payer: Medicare Other | Admitting: Gynecologic Oncology

## 2020-11-21 ENCOUNTER — Encounter: Payer: Self-pay | Admitting: Gynecologic Oncology

## 2020-11-21 VITALS — BP 129/80 | HR 75 | Temp 98.1°F | Resp 16 | Ht 65.0 in | Wt 175.0 lb

## 2020-11-21 DIAGNOSIS — Z7189 Other specified counseling: Secondary | ICD-10-CM

## 2020-11-21 DIAGNOSIS — N838 Other noninflammatory disorders of ovary, fallopian tube and broad ligament: Secondary | ICD-10-CM

## 2020-11-21 DIAGNOSIS — R9389 Abnormal findings on diagnostic imaging of other specified body structures: Secondary | ICD-10-CM | POA: Diagnosis not present

## 2020-11-21 DIAGNOSIS — N858 Other specified noninflammatory disorders of uterus: Secondary | ICD-10-CM | POA: Diagnosis not present

## 2020-11-21 DIAGNOSIS — N8502 Endometrial intraepithelial neoplasia [EIN]: Secondary | ICD-10-CM

## 2020-11-21 DIAGNOSIS — Z9889 Other specified postprocedural states: Secondary | ICD-10-CM

## 2020-11-21 DIAGNOSIS — E039 Hypothyroidism, unspecified: Secondary | ICD-10-CM

## 2020-11-21 DIAGNOSIS — N83202 Unspecified ovarian cyst, left side: Secondary | ICD-10-CM | POA: Diagnosis not present

## 2020-11-21 IMAGING — US US PELVIS COMPLETE WITH TRANSVAGINAL
1 series · 13 of 25 positions shown · non-contrast
Comparison: Ultrasound [DATE], [DATE], CT [DATE]

CLINICAL DATA: Ovarian cyst

EXAM:
TRANSABDOMINAL AND TRANSVAGINAL ULTRASOUND OF PELVIS
TECHNIQUE: Both transabdominal and transvaginal ultrasound examinations of the
pelvis were performed. Transabdominal technique was performed for
global imaging of the pelvis including uterus, ovaries, adnexal
regions, and pelvic cul-de-sac. It was necessary to proceed with
endovaginal exam following the transabdominal exam to visualize the
uterus endometrium ovaries.

[Series 1: us pelvis complete with transvaginal · 125 acquisitions, 13 frames shown]
[im 1/125]
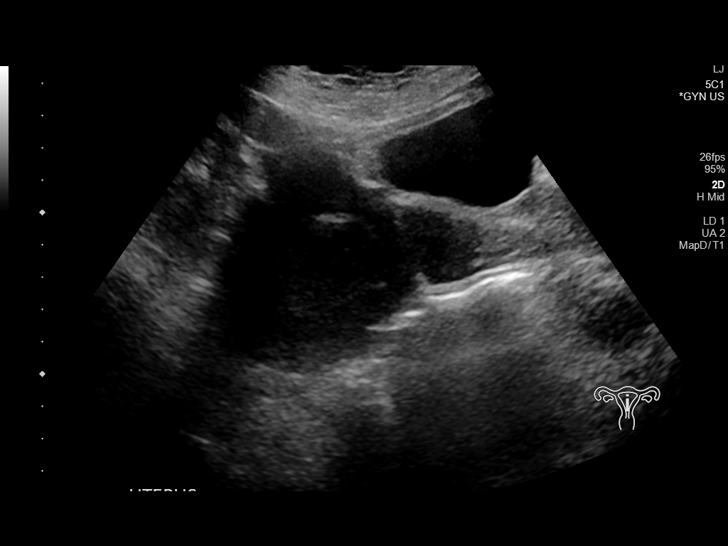
[im 11/125]
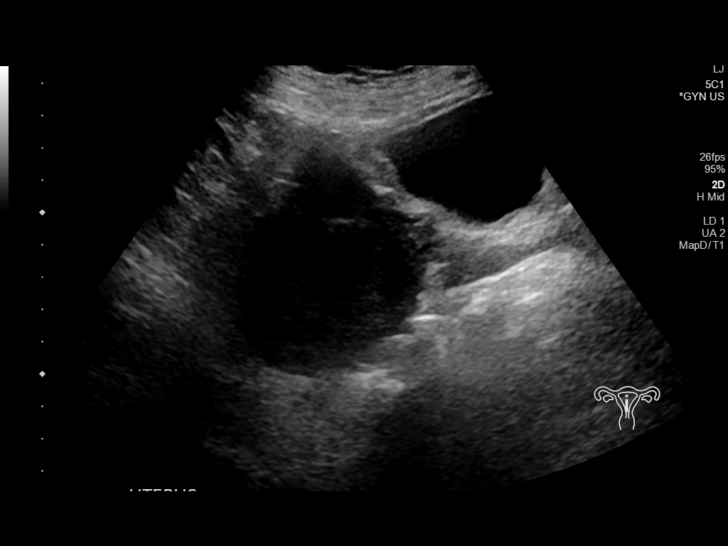
[im 21/125]
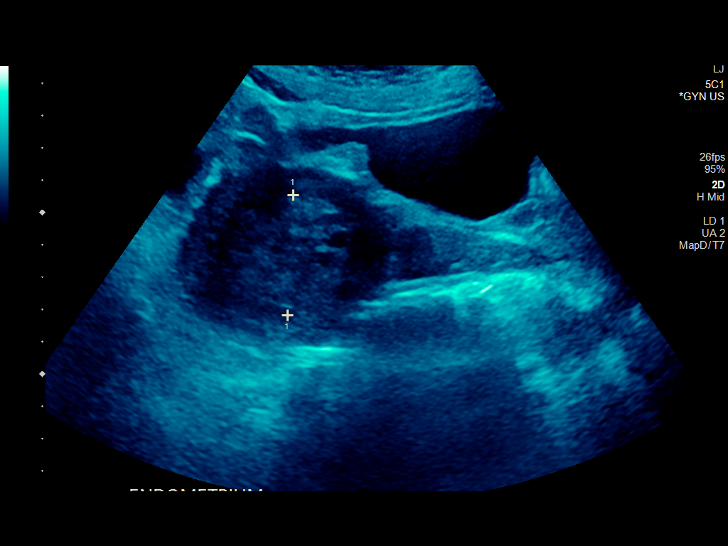
[im 32/125]
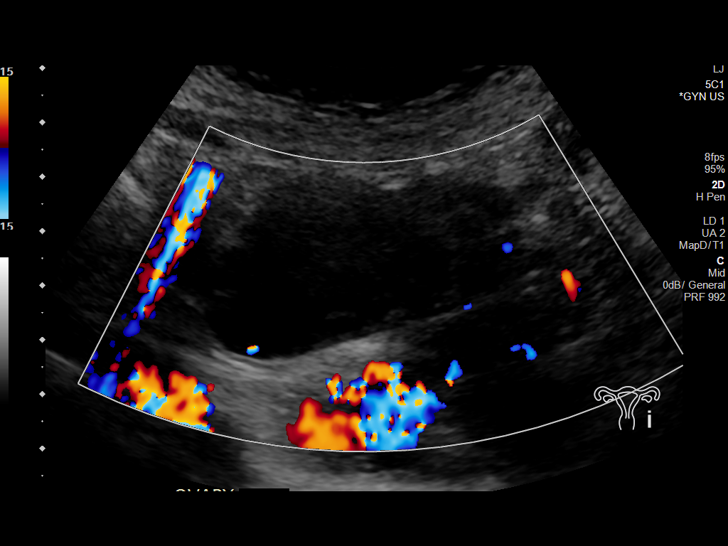
[im 42/125]
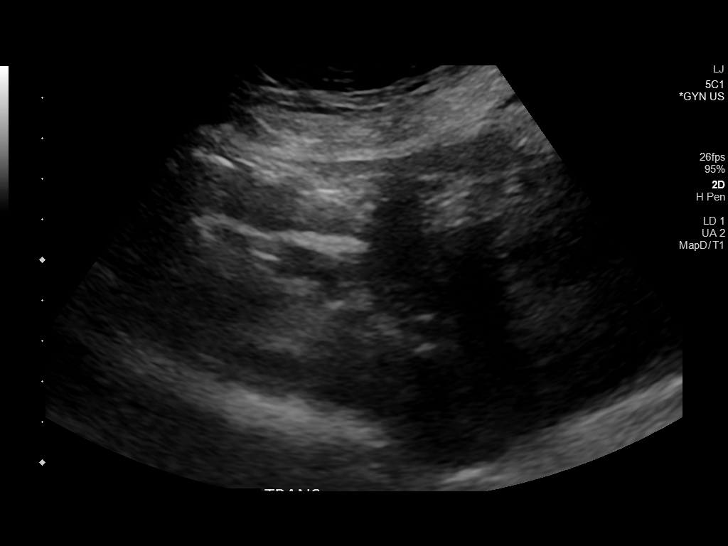
[im 52/125]
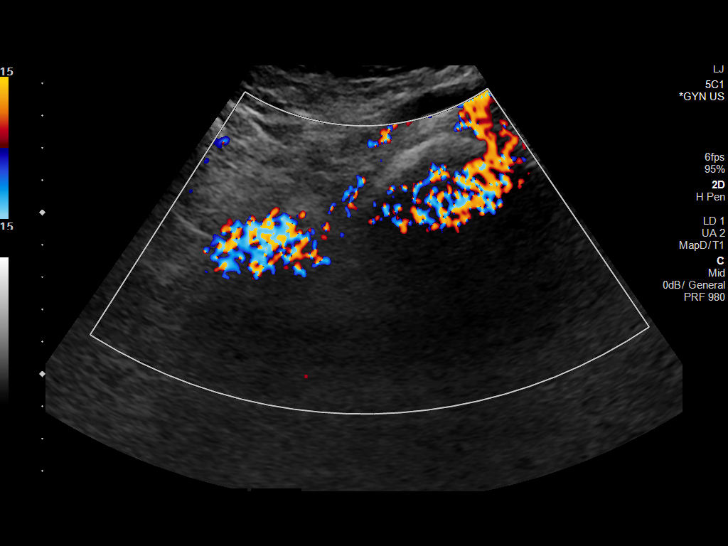
[im 63/125]
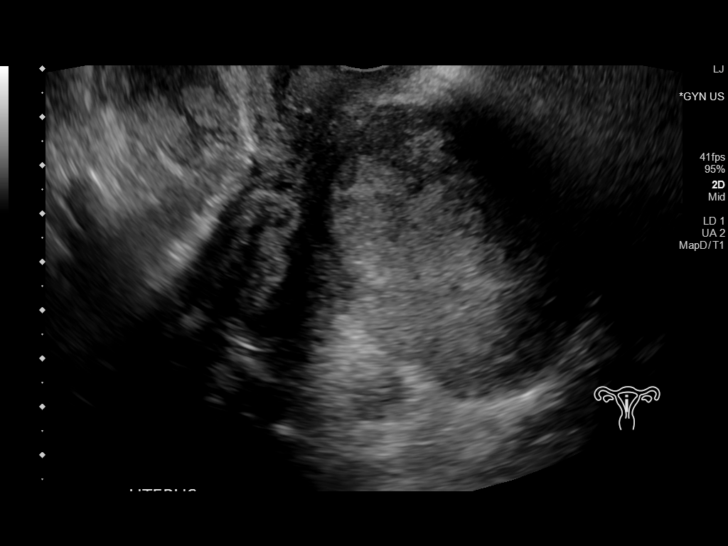
[im 73/125]
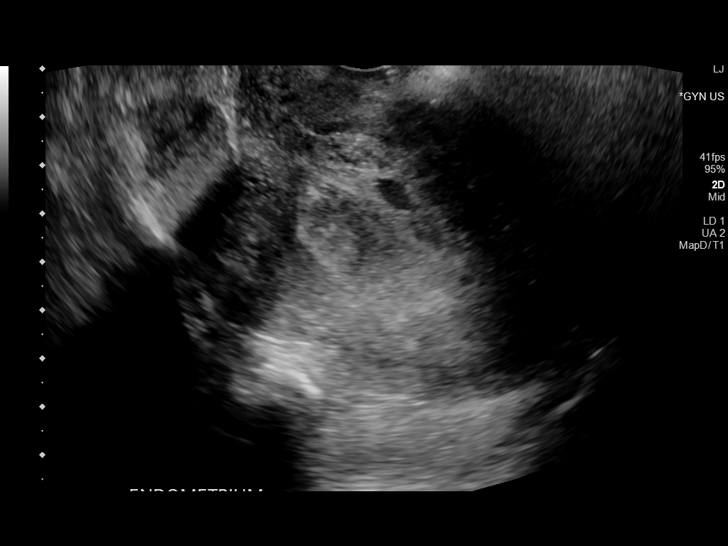
[im 83/125]
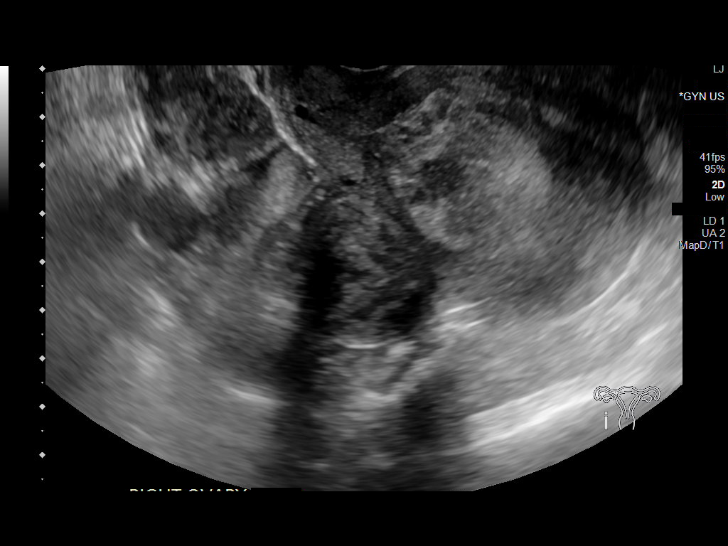
[im 94/125]
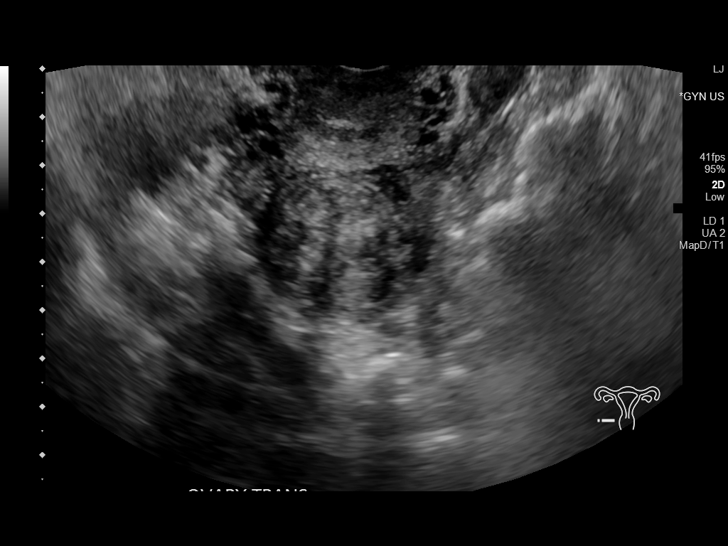
[im 104/125]
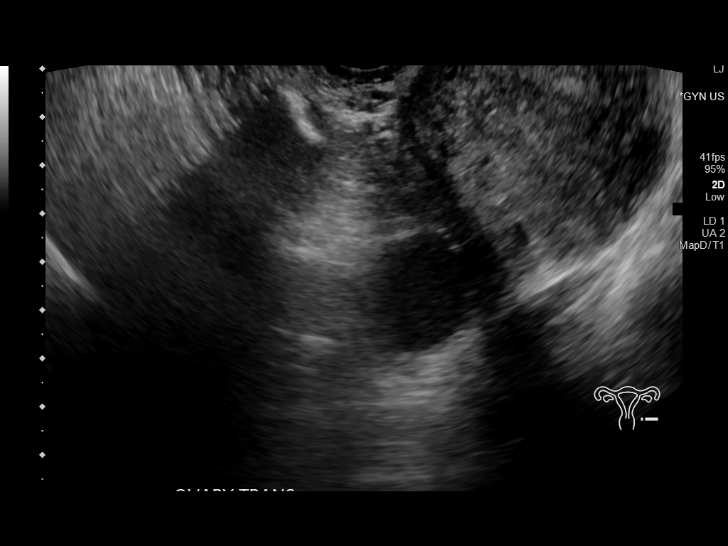
[im 114/125]
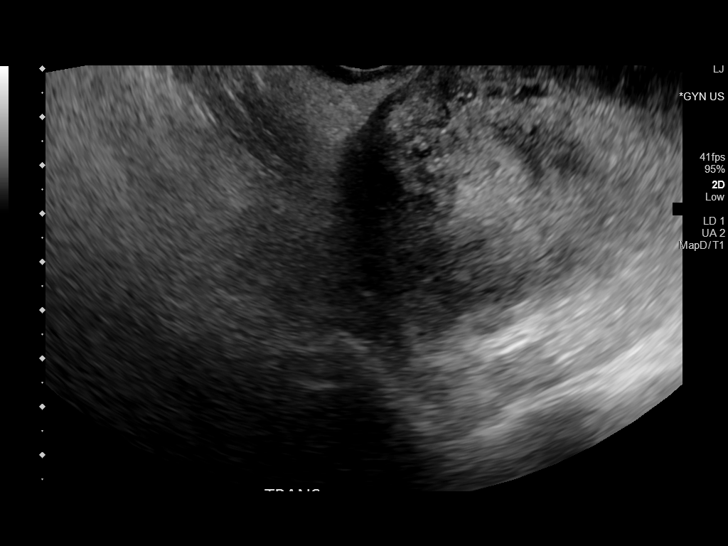
[im 125/125]
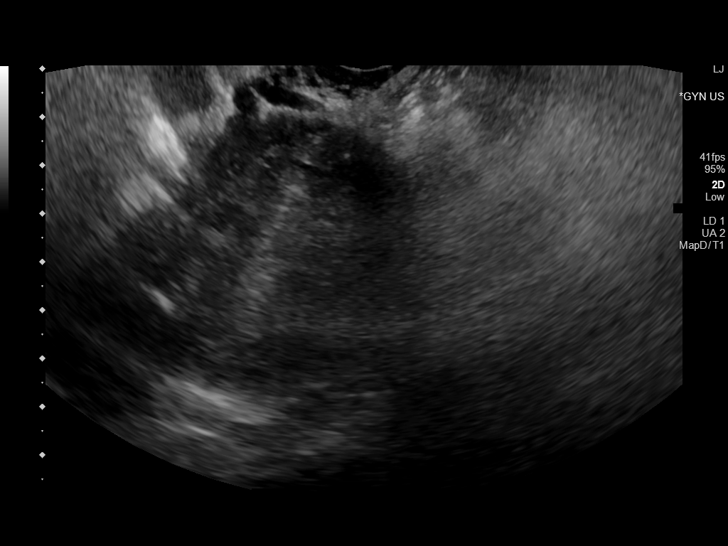

[13 of 25 positions shown; findings below may reference images not displayed]

FINDINGS: Uterus

Measurements: 8.7 x 5.6 by 7.2 cm.  = volume: 183 mL.

Endometrium

Thickness: Heterogenous endometrial thickening with multiple cystic
spaces measuring up to 3.2 cm.

Right ovary

Measurements: 4.1 x 2.9 x 3.7 cm = volume: 23 mL. Solid right
adnexal mass measuring 3.1 x 2.8 x 3 cm.

Left ovary

Measurements: 6.1 x 3.3 x 6.5 cm = volume: 67.9 mL. Mildly
complicated cyst in the left adnexa measuring 3.2 x 2.5 x 2.6 cm
with scattered internal echoes. Previously this measured 8.2 x 5.6 x
7.9 cm.

Other findings

No abnormal free fluid.
IMPRESSION: 1. Heterogenous endometrial thickening up to 3.2 cm, concerning for
endometrial carcinoma. Endometrial thickness is considered abnormal
for an asymptomatic post-menopausal female. Endometrial sampling if
not already performed should be considered to exclude carcinoma.
2. Decreased size of left adnexal cyst, now measuring 2.6 cm
maximum, previously 8.2 cm maximum. No specific further follow-up
imaging recommended
3. 3.1 cm solid mass along the right posterior uterus, indeterminate
for solid adnexal mass versus exophytic uterine mass. Consider
further evaluation with pelvic MRI.
[DATE].

## 2020-11-21 MED ORDER — MEGESTROL ACETATE 40 MG PO TABS: 80.0000 mg | ORAL_TABLET | Freq: Two times a day (BID) | ORAL | 1 refills | Status: DC

## 2020-11-21 NOTE — H&P (View-Only) (Signed)
Gynecologic Oncology Return Clinic Visit  11/21/2020  Reason for Visit: Follow-up recent surgery, treatment planning  Treatment History: CT A/P in 04/2020 showed enlarged fibroid uterus; complex cystic mass in left ovary measuring up to 5cm. Pelvic ultrasound 05/01/20: Enlarged multi-fibroid uterus, endometrium 3.51mm. Left ovary measures up to 7cm with a 5.8 x 4.1 x 4cm dominant septated cyst.  CA-125 04/2020: 592 08/10/2020: Pelvic ultrasound showed thickened heterogenous endometrial complex with somewhat nodular margins associated with a 3.8 cm diameter fluid collection within the endometrial canal.  8.2 cm cyst of the left ovary. 08/2020: 731 08/27/2020: Endometrial biopsy showing minute fragments of benign inactive endometrium, mostly blood and fibrin 11/01/2020: Dilation of the cervix, hysteroscopy, and hysteroscopic sampling of the endometrium.  Final pathology showed at least complex atypical hyperplasia. Ultrasound today pending  Interval History: Patient presents today with her daughter for discussion after surgery.  She notes overall doing well although is continuing to have episodes of bleeding.  She has now had 3 since the surgery where she will lose large volume of blood with associated passage of clots measuring up to 1 cup in size.  She had her most recent episode today with bleeding that started in the shower.  She bled through her jeans, and continued to bleed when she came for her ultrasound.  Bleeding now seems to have slowed down.  Her bleeding has only stopped very briefly, otherwise she has had light bleeding since surgery.  Feels dizzy and lightheaded during these episodes of heavier bleeding, this resolves after bleeding slows down.  Past Medical/Surgical History: Past Medical History:  Diagnosis Date   Acute bronchospasm due to viral infection 2020   Due to pollen   Anemia    history of   CVA (cerebral vascular accident) ([place]) 02/28/2020   and 04/19/20   Depressive  disorder    Epistaxis    Fatigue    Hearing loss    History of loop recorder    Hypothyroidism    Memory change    Migraine    Obesity (BMI 30.0-34.9)    lost 60 lbs   Osteoarthritis    Osteoporosis    Polyuria    Premature menopause    Seizures ([place]) 1994   history of mini seizures, possible migraine induced    Past Surgical History:  Procedure Laterality Date   ABLATION  2006   BUBBLE STUDY  04/23/2020   Procedure: BUBBLE STUDY;  Surgeon: Larey Dresser, MD;  Location: [facility];  Service: Cardiovascular;;   CATARACT EXTRACTION W/ INTRAOCULAR LENS IMPLANT Bilateral    COLONOSCOPY     COLONOSCOPY WITH PROPOFOL N/A 12/22/2018   Procedure: COLONOSCOPY WITH PROPOFOL;  Surgeon: Virgel Manifold, MD;  Location: [facility];  Service: Endoscopy;  Laterality: N/A;   EYE SURGERY  Spring 2018   Cataracts both eyes   HYSTEROSCOPY WITH D & C N/A 11/01/2020   Procedure: DILATATION AND CURETTAGE /HYSTEROSCOPY WITH MYOSURE;  Surgeon: Lafonda Mosses, MD;  Location: [facility];  Service: Gynecology;  Laterality: N/A;   LOOP RECORDER INSERTION N/A 04/23/2020   Procedure: LOOP RECORDER INSERTION;  Surgeon: Evans Lance, MD;  Location: [facility];  Service: Cardiovascular;  Laterality: N/A;   TEE WITHOUT CARDIOVERSION N/A 04/23/2020   Procedure: TRANSESOPHAGEAL ECHOCARDIOGRAM (TEE);  Surgeon: Larey Dresser, MD;  Location: [facility];  Service: Cardiovascular;  Laterality: N/A;   TONSILLECTOMY      Family History  Problem Relation Age of Onset   Early death Father  suicide   Depression Father    Diabetes Father    Cancer Father        prostate   Hearing loss Father        due to war   Alzheimer's disease Mother    Heart disease Brother    Atrial fibrillation Brother    Heart disease Maternal Aunt    Dementia Maternal Aunt    Heart disease Maternal Uncle    Dementia Maternal Grandmother    Heart disease Brother    Atrial fibrillation Brother     Colon cancer Neg Hx    Breast cancer Neg Hx    Ovarian cancer Neg Hx    Pancreatic cancer Neg Hx    Endometrial cancer Neg Hx     Social History   Socioeconomic History   Marital status: Single    Spouse name: Not on file   Number of children: 1   Years of education: Not on file   Highest education level: Bachelor's degree (e.g., BA, AB, BS)  Occupational History   Occupation: retired   Tobacco Use   Smoking status: Former    Packs/day: 0.50    Years: 6.00    Pack years: 3.00    Types: Cigarettes    Quit date: 02/11/1974    Years since quitting: 46.8   Smokeless tobacco: Never  Vaping Use   Vaping Use: Never used  Substance and Sexual Activity   Alcohol use: Yes    Alcohol/week: 0.0 standard drinks    Comment: 2-3 drinks monthly   Drug use: Never   Sexual activity: Not Currently    Comment: Don't use not sexually active  Other Topics Concern   Not on file  Social History Narrative   Raised an adopted child on her own   Working part time reviewed documents   Social Determinants of Radio broadcast assistant Strain: Low Risk    Difficulty of Paying Living Expenses: Not hard at all  Food Insecurity: No Food Insecurity   Worried About Charity fundraiser in the Last Year: Never true   Arboriculturist in the Last Year: Never true  Transportation Needs: No Transportation Needs   Lack of Transportation (Medical): No   Lack of Transportation (Non-Medical): No  Physical Activity: Inactive   Days of Exercise per Week: 0 days   Minutes of Exercise per Session: 0 min  Stress: No Stress Concern Present   Feeling of Stress : Not at all  Social Connections: Moderately Integrated   Frequency of Communication with Friends and Family: More than three times a week   Frequency of Social Gatherings with Friends and Family: Twice a week   Attends Religious Services: More than 4 times per year   Active Member of [employer] or Organizations: Yes   Attends Music therapist:  More than 4 times per year   Marital Status: Never married    Current Medications:  Current Outpatient Medications:    atorvastatin (LIPITOR) 40 MG tablet, Take 1 tablet (40 mg total) by mouth daily., Disp: 90 tablet, Rfl: 1   buPROPion (WELLBUTRIN SR) 150 MG 12 hr tablet, TAKE 2 TABLETS BY MOUTH EVERY DAY (Patient taking differently: Take 150 mg by mouth 2 (two) times daily.), Disp: 180 tablet, Rfl: 1   citalopram (CELEXA) 40 MG tablet, TAKE 1 TABLET BY MOUTH EVERY DAY, Disp: 90 tablet, Rfl: 1   clindamycin-benzoyl peroxide (BENZACLIN) gel, Apply topically 2 (two) times daily. (Patient taking differently:  Apply 1 application topically 2 (two) times daily as needed (facial acne).), Disp: 50 g, Rfl: 0   clopidogrel (PLAVIX) 75 MG tablet, Take 1 tablet (75 mg total) by mouth daily., Disp: 30 tablet, Rfl: 1   Cyanocobalamin (VITAMIN B-12) 1000 MCG SUBL, Place 1,000 mcg under the tongue at bedtime., Disp: , Rfl:    levothyroxine (SYNTHROID) 75 MCG tablet, TAKE 1 TABLET(75 MCG) BY MOUTH DAILY BEFORE BREAKFAST, Disp: 90 tablet, Rfl: 1   megestrol (MEGACE) 40 MG tablet, Take 2 tablets (80 mg total) by mouth 2 (two) times daily., Disp: 120 tablet, Rfl: 1   Multiple Vitamin (MULTIVITAMIN WITH MINERALS) TABS tablet, Take 1 tablet by mouth daily., Disp: , Rfl:    polyvinyl alcohol (LIQUIFILM TEARS) 1.4 % ophthalmic solution, Place 1 drop into both eyes daily as needed for dry eyes., Disp: , Rfl:    rivaroxaban (XARELTO) 20 MG TABS tablet, Take 20 mg by mouth daily with supper., Disp: , Rfl:    Ubrogepant (UBRELVY) 100 MG TABS, Take 100 mg by mouth., Disp: , Rfl:    Vitamin D, Ergocalciferol, (DRISDOL) 1.25 MG (50000 UNIT) CAPS capsule, TAKE 1 CAPSULE BY MOUTH EVERY 7 DAYS (Patient taking differently: Take 50,000 Units by mouth every Monday. TAKE 1 CAPSULE BY MOUTH EVERY 7 DAYS), Disp: 12 capsule, Rfl: 1   traMADol (ULTRAM) 50 MG tablet, Take 1 tablet (50 mg total) by mouth every 12 (twelve) hours as needed  for up to 5 doses., Disp: 5 tablet, Rfl: 0  Review of Systems: Pertinent positives include appetite changes, vaginal bleeding, dizziness, bruising/bleeding easily. Denies fevers, chills, fatigue, unexplained weight changes. Denies hearing loss, neck lumps or masses, mouth sores, ringing in ears or voice changes. Denies cough or wheezing.  Denies shortness of breath. Denies chest pain or palpitations. Denies leg swelling. Denies abdominal distention, pain, blood in stools, constipation, diarrhea, nausea, vomiting, or early satiety. Denies pain with intercourse, dysuria, frequency, hematuria or incontinence. Denies hot flashes, pelvic pain or vaginal discharge.   Denies joint pain, back pain or muscle pain/cramps. Denies itching, rash, or wounds. Denies headaches, numbness or seizures. Denies swollen lymph nodes or glands. Denies anxiety, depression, confusion, or decreased concentration.  Physical Exam: BP 129/80 (BP Location: Right Arm, Patient Position: Sitting)   Pulse 75   Temp 98.1 F (36.7 C) (Oral)   Resp 16   Ht 5\' 5"  (1.651 m)   Wt 175 lb (79.4 kg)   SpO2 100%   BMI 29.12 kg/m  General: Alert, oriented, no acute distress. HEENT: Normocephalic, atraumatic, sclera anicteric.  Laboratory & Radiologic Studies: A. ENDOMETRIUM, CURETTAGE:  - Atypical hyperplasia, see comment   Assessment & Plan: Natalie [name] is a 70 y.o. woman with at least complex atypical hyperplasia as well as ovarian cyst who presents today for follow-up after recent endometrial sampling.  Patient has done well from a postoperative standpoint other than her continued episodes of heavier bleeding.  I suspect that this is in relation to her hyperplasia in combination with being on both Plavix and a blood thinner.  I have suggested today that we increase to treatment dosing of progesterone.  I sent in a new prescription for Megace.  I have discussed with one of our hematologist and the risk of VTE in  the setting of being on both Plavix and anticoagulation is very low even on high-dose progesterone.  We discussed other side effects that can be associated with high-dose progesterone.  In my review of the patient's ultrasound  today, the cyst on her left ovary appears smaller in size.  There is a right solid appearing lesion in the adnexa although I favor this to be associated with the uterus.  I will wait for the radiologist review.  As long as there are not not concerning features in the adnexa on ultrasound, then the patient and I discussed plan to defer definitive surgery until she is 9 months out from her stroke, which would be in mid December.  With regard to the diagnosis of at least complex atypical hyperplasia.  I feel strongly that we should proceed with the most minimal surgery possible when she goes for surgery in December.  I discussed that typically in the setting of complex atypical hyperplasia I would either perform sentinel lymph node biopsy or I would plan to send the uterus for the pathologist to review to help guide whether lymph node removal was necessary if a malignancy was seen.  I recommend that we proceed with total hysterectomy and BSO and plan any adjuvant treatment based on final pathology results, if they show malignancy, based on tissue that we have.  I will asked Melissa to call the patient in the next couple of days to select a surgery date for December.  We will plan to see the patient back in early December for a preoperative visit.  45 minutes of total time was spent for this patient encounter, including preparation, face-to-face counseling with the patient and coordination of care, and documentation of the encounter.  Jeral Pinch, MD  Division of Gynecologic Oncology  Department of Obstetrics and Gynecology  [facility]

## 2020-11-21 NOTE — Patient Instructions (Signed)
It was good to see you today. I sent in the new prescription for the high-dose progesterone. Please keep me posted on your bleeding.  I will release ultrasound results once back  We will plan for visit in early December and tentatively schedule surgery in mid-December. I will have Melissa reach out with the date tomorrow or Friday.

## 2020-11-21 NOTE — Therapy (Signed)
[place] MAIN [facility] SERVICES [address] [place], [place], [address] Phone: [phone]   Fax:  [phone]  Physical Therapy Evaluation  Patient Details  Name: Natalie [name] MRN: [id number] Date of Birth: 1950/10/06 Referring Provider (PT): [name], MD   Encounter Date: 11/20/2020   PT End of Session - 11/21/20 0943     Visit Number 23    Number of Visits 36    Date for PT Re-Evaluation 01/15/21    Authorization Type Traditional Medicare with [employer] Supplemental: VL based on certification    Authorization Time Period 11/20/20-01/15/21    Progress Note Due on Visit 30    PT Start Time 1438    PT Stop Time 1515    PT Time Calculation (min) 37 min    Activity Tolerance Patient tolerated treatment well;Treatment limited secondary to medical complications (Comment)   orthostatsis   Behavior During Therapy [facility] for tasks assessed/performed             Past Medical History:  Diagnosis Date   Acute bronchospasm due to viral infection 2020   Due to pollen   Anemia    history of   CVA (cerebral vascular accident) ([place]) 02/28/2020   and 04/19/20   Depressive disorder    Epistaxis    Fatigue    Hearing loss    History of loop recorder    Hypothyroidism    Memory change    Migraine    Obesity (BMI 30.0-34.9)    lost 60 lbs   Osteoarthritis    Osteoporosis    Polyuria    Premature menopause    Seizures ([place]) 1994   history of mini seizures, possible migraine induced    Past Surgical History:  Procedure Laterality Date   ABLATION  2006   BUBBLE STUDY  04/23/2020   Procedure: BUBBLE STUDY;  Surgeon: Larey Dresser, MD;  Location: [facility];  Service: Cardiovascular;;   CATARACT EXTRACTION W/ INTRAOCULAR LENS IMPLANT Bilateral    COLONOSCOPY     COLONOSCOPY WITH PROPOFOL N/A 12/22/2018   Procedure: COLONOSCOPY WITH PROPOFOL;  Surgeon: Virgel Manifold, MD;  Location: [facility] ENDOSCOPY;  Service: Endoscopy;   Laterality: N/A;   EYE SURGERY  Spring 2018   Cataracts both eyes   HYSTEROSCOPY WITH D & C N/A 11/01/2020   Procedure: DILATATION AND CURETTAGE /HYSTEROSCOPY WITH MYOSURE;  Surgeon: Lafonda Mosses, MD;  Location: [facility];  Service: Gynecology;  Laterality: N/A;   LOOP RECORDER INSERTION N/A 04/23/2020   Procedure: LOOP RECORDER INSERTION;  Surgeon: Evans Lance, MD;  Location: [facility] CV LAB;  Service: Cardiovascular;  Laterality: N/A;   TEE WITHOUT CARDIOVERSION N/A 04/23/2020   Procedure: TRANSESOPHAGEAL ECHOCARDIOGRAM (TEE);  Surgeon: Larey Dresser, MD;  Location: [facility] ENDOSCOPY;  Service: Cardiovascular;  Laterality: N/A;   TONSILLECTOMY      There were no vitals filed for this visit.    Subjective Assessment - 11/20/20 1443     Subjective Pt returns to [facility]PP[facility] for conitnued rehab s/p procedure on 9/29. Pt has had no pain related to precedure, but has had ongoing intermittent vaginal bleeding/clotting. Pt was cleared for return to PT per surgeon. Pt reports since undergoing gewneral anesthesia she has had consistently moe fatigue, sluggishness, and dizziness with upright activty (standing and walking). Pt reports she has begun to experience increased sway when commencing walking, daily at this point whereas this had previously resolved while still doing therapy. Pt has worked on her  HEP intermittently, but has been generally limited by feeling poorly in general (malaise, nausea, fatigue, dizziness).    Pertinent History Natalie [name] is a 19yoF who is referred to OPPT neuro by PCP after acut eonset changes in mentation, activity tolerance, and steadiness of gait. Pt sustained an acute Left MCA CVA on 04/19/20. PMH: CVA in Jan 2022, anxiety, hypothyroidism, depression, OA, osteoporosis, memory change, migraine, seizures, hearing loss, orthostatic hypotension. Pt seen in our clinc from April 2022-Sept 2022, then took a break for dilation, hysteroscopy, and hysteroscopic curettage on 9/29,  given clearance to return to rehab 2 weeks later.    How long can you sit comfortably? not a limitations    How long can you stand comfortably? ~ 5 minutes    How long can you walk comfortably? since procedure, has tolerated all household and limited community distances thus far, has not performed longer.    Patient Stated Goals Pt says she wants to improve her balance and does not want to fall again    Currently in Pain? No/denies                [facility] PT Assessment - 11/21/20 0001       Assessment   Medical Diagnosis unsteadiness in gait s/p Lt MCA infarcts; lightheadedness    Referring Provider (PT) [name], MD    Onset Date/Surgical Date 04/19/20    Hand Dominance Right    Prior Therapy seen here previously, 3 week stop for a procedure      Precautions   Precautions Fall      Restrictions   Weight Bearing Restrictions No      Observation/Other Assessments   Focus on Therapeutic Outcomes (FOTO)  71    Other Surveys  Activities of Balance and Confidence Scale    Activities of Balance Confidence Scale (ABC Scale)  83      Transfers   Five time sit to stand comments  14.67 hands free, green chair             Orthostatic vitals:  -126/54mmHg  74bpm Supine -110/77mmHg 74bpm seated  -95/80mmhg 77bpm standing  -103/78 mmHg 86 standing 2 minutes     Objective measurements completed on examination: See above findings.         PT Education - 11/21/20 [phone]     Education Details Reviewed edcucation reagrding her orthostatis problem and how it may be contributing to her ongoing difficulty.    Person(s) Educated Patient    Methods Explanation;Demonstration    Comprehension Verbalized understanding              PT Short Term Goals - 11/21/20 0948       PT SHORT TERM GOAL #1   Title Pt will report good compliance with HEP in order to improve strength and balance in order to decrease fall risk and improve function at home and work.    Baseline  10/19: coached resuming performance;    Time 2    Period Weeks    Status New    Target Date 12/18/20               PT Long Term Goals - 11/21/20 0948       PT LONG TERM GOAL #1   Title Pt will improve DGI by at least 3 points in order to demonstrate clinically significant improvement in balance and decreased risk for falls.    Baseline 4/18: 18/24; 9/13: 18/24;    Time 8  Period Weeks    Status New    Target Date 01/15/21      PT LONG TERM GOAL #2   Title Pt to demonstrate performance of 6MWT >1254ft to improve AMB tolerance for safe and independent IADL performance.    Baseline 10/19: Performing only household and limited community distances;    Time 8    Period Weeks    Status New    Target Date 01/15/21      PT LONG TERM GOAL #3   Title Pt to demonstrate improved 5xSTS in less than 12sec hands free to indicate improved BLE power for improved step-response righting during LOB events.    Baseline 10/19: 14.67sec (ovious hip extension weakness after 3rd rep)    Time 8    Period Weeks    Status New    Target Date 01/15/21      PT LONG TERM GOAL #4   Title Patient will increase BLE gross strength to > or = 4+/5 as to improve functional strength for independent gait, increased standing tolerance and increased ADL ability.    Baseline 4/18: gross BLE strength currnelty 4/5 7/6: hips 4+/5 ankles 4/5 9/13: grossly 4+/5    Time 8    Period Weeks    Status New    Target Date 01/15/21      PT LONG TERM GOAL #5   Title Patient will increase FOTO score to equal to or greater than 75 to demonstrate improvement in mobility and quality of life.    Baseline 4/18: 74% 7/6: 59%; 7/21: 65 9/13: 68%; 10/18: 71    Time 8    Period Weeks    Status New    Target Date 01/15/21      PT LONG TERM GOAL #6   Title Patient will reduce dizziness handicap inventory score to <10, for less dizziness with ADLs and increased safety with home and work tasks.    Baseline 4/27: DHI score is  16, indicating mild handicap 7/6: 22% 9/13: 20%    Time 8    Period Weeks    Status On-going    Target Date 01/15/21                    Plan - 11/21/20 0945     Clinical Impression Statement Reassessment performed this date after 3 week hiatus for medical procedure, granted clearance for return to PT from surgeon. Pt reports a variety of symptoms during her post-procedure interval, malaise, fatigue, weakness, intermittent nausea and vomiting, dizziness, difficulty with gait control upon rising to standing. Pt educated that these are all symptoms of hypotension, today she demonstrates orthostasis similar to previous episodes, with minimal symptoms early during transition movements. Standing 2 minute assessment shows mild recovery in pressures, however still >72mmHg below supine value. FOTO survey and ABC score fairly consistent with values taken recently prior to her hiatus. Pt still has not been able to return to activities that require sustained time on feet including her fitness walking routine. Pt will continue to benefit from PT intervention to maximize balance, motor control, and activity tolerance in mobility required for ADL, IADL, and leisure activity to promote safe and independence return to baseline.    Personal Factors and Comorbidities Age;Comorbidity 1;Comorbidity 2;Past/Current Experience;Sex;Social Background;Behavior Pattern;Comorbidity 3+    Comorbidities CVA in Jan 2022, anxiety, hypothyroidism, depression, OA, osteoporosis, memory change, migraine, seizures, hearing loss, orthostatic hypotension    Examination-Activity Limitations Bathing;Stairs;Stand;Hygiene/Grooming;Locomotion Level;Carry;Bed Mobility;Other;Reach Overhead;Lift    Examination-Participation Restrictions Community Activity;[employer]  Work;Shop;Cleaning;Laundry;Medication Management;Meal Prep    Stability/Clinical Decision Making Evolving/Moderate complexity    Clinical Decision Making High    Rehab Potential  Fair    PT Frequency 2x / week    PT Duration 8 weeks    PT Treatment/Interventions ADLs/Self Care Home Management;Biofeedback;Canalith Repostioning;Cryotherapy;Electrical Stimulation;Aquatic Therapy;Moist Heat;Ultrasound;DME Instruction;Gait training;Stair training;Functional mobility training;Therapeutic activities;Therapeutic exercise;Balance training;Neuromuscular re-education;Cognitive remediation;Patient/family education;Orthotic Fit/Training;Manual techniques;Passive range of motion;Energy conservation;Taping;Splinting;Vestibular;Visual/perceptual remediation/compensation;Joint Manipulations    PT Next Visit Plan Reassessment orthostatic vitals including a 3 minute standing and standing at end of walking; resume dynamic motor control balance training;    PT Home Exercise Plan Access Code: [email or web address], added:  Access Code: [email or web address], no updates    Consulted and Agree with Plan of Care Patient             Patient will benefit from skilled therapeutic intervention in order to improve the following deficits and impairments:  Abnormal gait, Decreased balance, Decreased endurance, Difficulty walking, Decreased range of motion, Dizziness, Improper body mechanics, Impaired vision/preception, Decreased coordination, Decreased strength, Decreased mobility, Obesity, Decreased activity tolerance, Decreased knowledge of use of DME, Impaired flexibility, Postural dysfunction, Cardiopulmonary status limiting activity, Decreased knowledge of precautions  Visit Diagnosis: Abnormality of gait and mobility  Difficulty in walking, not elsewhere classified  Muscle weakness (generalized)  Unsteadiness on feet  Other lack of coordination  Other abnormalities of gait and mobility     Problem List Patient Active Problem List   Diagnosis Date Noted   Complex atypical endometrial hyperplasia 11/05/2020   Post-menopausal bleeding    Thickened endometrium 08/13/2020   Acute ITP ([place]) 05/02/2020   Other  intra-abdominal and pelvic swelling, mass and lump 04/26/2020   Pelvic mass in female 04/25/2020   Ovarian cancer screening 04/25/2020   Ovarian mass, left 04/25/2020   Stroke (cerebrum) ([place]) 04/20/2020   Acute CVA (cerebrovascular accident) ([place]) 04/19/2020   CVA (cerebral vascular accident) ([place]) 04/19/2020   History of ischemic multifocal posterior circulation stroke 02/29/2020   Thrombocytopenia ([place]) 02/28/2020   HPV test positive 07/08/2015   Difficulty hearing 06/28/2015   Arthritis, degenerative 06/28/2015   Obesity, Class I, BMI 30-34.9 06/28/2015   Paresthesia 06/28/2015   Purpura, nonthrombopenic ([place]) 06/28/2015   Other specified hypothyroidism 08/09/2014   Migraine without aura and without status migrainosus, not intractable 09/19/2008   Moderate major depression ([place]) 09/07/2006   OP (osteoporosis) 09/07/2006   10:01 AM, 11/21/20 Etta Grandchild, PT, DPT Physical Therapist - [facility]  Outpatient Physical [place] [phone]     [place], PT 11/21/2020, 10:00 AM  [facility] [address] [place], [place], [address] Phone: [phone]   Fax:  [phone]  Name: LEONDA CRISTO MRN: [id number] Date of Birth: 10-Mar-1950

## 2020-11-21 NOTE — Telephone Encounter (Signed)
Requested Prescriptions  Pending Prescriptions Disp Refills  . levothyroxine (SYNTHROID) 75 MCG tablet [Pharmacy Med Name: LEVOTHYROXINE 0.075MG  (75MCG) TABS] 90 tablet 1    Sig: TAKE 1 TABLET(75 MCG) BY MOUTH DAILY BEFORE BREAKFAST     Endocrinology:  Hypothyroid Agents Failed - 11/21/2020  6:21 AM      Failed - TSH needs to be rechecked within 3 months after an abnormal result. Refill until TSH is due.      Passed - TSH in normal range and within 360 days    TSH  Date Value Ref Range Status  08/05/2020 1.015 0.350 - 4.500 uIU/mL Final    Comment:    Performed by a 3rd Generation assay with a functional sensitivity of <=0.01 uIU/mL. Performed at [facility], [address] [place], [place] [address]   01/25/2020 2.79 0.40 - 4.50 mIU/L Final         Passed - Valid encounter within last 12 months    Recent Outpatient Visits          3 months ago Recurrent cerebrovascular accidents (CVAs) [facility])   [facility] [name], MD   6 months ago Recurrent cerebrovascular accidents (CVAs) [facility])   [facility] [name], MD   8 months ago History of CVA in adulthood   [facility] [name], MD   10 months ago Double vision with both eyes open   [facility] [name], MD   1 year ago Adult hypothyroidism   [facility] [name], MD      Future Appointments            In 3 weeks [name], MD [facility], [place]   In 4 weeks  [facility]

## 2020-11-21 NOTE — Progress Notes (Signed)
Gynecologic Oncology Return Clinic Visit  11/21/2020  Reason for Visit: Follow-up recent surgery, treatment planning  Treatment History: CT A/P in 04/2020 showed enlarged fibroid uterus; complex cystic mass in left ovary measuring up to 5cm. Pelvic ultrasound 05/01/20: Enlarged multi-fibroid uterus, endometrium 3.37mm. Left ovary measures up to 7cm with a 5.8 x 4.1 x 4cm dominant septated cyst.  CA-125 04/2020: 592 08/10/2020: Pelvic ultrasound showed thickened heterogenous endometrial complex with somewhat nodular margins associated with a 3.8 cm diameter fluid collection within the endometrial canal.  8.2 cm cyst of the left ovary. 08/2020: 731 08/27/2020: Endometrial biopsy showing minute fragments of benign inactive endometrium, mostly blood and fibrin 11/01/2020: Dilation of the cervix, hysteroscopy, and hysteroscopic sampling of the endometrium.  Final pathology showed at least complex atypical hyperplasia. Ultrasound today pending  Interval History: Patient presents today with her daughter for discussion after surgery.  She notes overall doing well although is continuing to have episodes of bleeding.  She has now had 3 since the surgery where she will lose large volume of blood with associated passage of clots measuring up to 1 cup in size.  She had her most recent episode today with bleeding that started in the shower.  She bled through her jeans, and continued to bleed when she came for her ultrasound.  Bleeding now seems to have slowed down.  Her bleeding has only stopped very briefly, otherwise she has had light bleeding since surgery.  Feels dizzy and lightheaded during these episodes of heavier bleeding, this resolves after bleeding slows down.  Past Medical/Surgical History: Past Medical History:  Diagnosis Date   Acute bronchospasm due to viral infection 2020   Due to pollen   Anemia    history of   CVA (cerebral vascular accident) ([place]) 02/28/2020   and 04/19/20   Depressive  disorder    Epistaxis    Fatigue    Hearing loss    History of loop recorder    Hypothyroidism    Memory change    Migraine    Obesity (BMI 30.0-34.9)    lost 60 lbs   Osteoarthritis    Osteoporosis    Polyuria    Premature menopause    Seizures ([place]) 1994   history of mini seizures, possible migraine induced    Past Surgical History:  Procedure Laterality Date   ABLATION  2006   BUBBLE STUDY  04/23/2020   Procedure: BUBBLE STUDY;  Surgeon: Larey Dresser, MD;  Location: [facility];  Service: Cardiovascular;;   CATARACT EXTRACTION W/ INTRAOCULAR LENS IMPLANT Bilateral    COLONOSCOPY     COLONOSCOPY WITH PROPOFOL N/A 12/22/2018   Procedure: COLONOSCOPY WITH PROPOFOL;  Surgeon: Virgel Manifold, MD;  Location: [facility];  Service: Endoscopy;  Laterality: N/A;   EYE SURGERY  Spring 2018   Cataracts both eyes   HYSTEROSCOPY WITH D & C N/A 11/01/2020   Procedure: DILATATION AND CURETTAGE /HYSTEROSCOPY WITH MYOSURE;  Surgeon: Lafonda Mosses, MD;  Location: [facility];  Service: Gynecology;  Laterality: N/A;   LOOP RECORDER INSERTION N/A 04/23/2020   Procedure: LOOP RECORDER INSERTION;  Surgeon: Evans Lance, MD;  Location: [facility];  Service: Cardiovascular;  Laterality: N/A;   TEE WITHOUT CARDIOVERSION N/A 04/23/2020   Procedure: TRANSESOPHAGEAL ECHOCARDIOGRAM (TEE);  Surgeon: Larey Dresser, MD;  Location: [facility];  Service: Cardiovascular;  Laterality: N/A;   TONSILLECTOMY      Family History  Problem Relation Age of Onset   Early death Father  suicide   Depression Father    Diabetes Father    Cancer Father        prostate   Hearing loss Father        due to war   Alzheimer's disease Mother    Heart disease Brother    Atrial fibrillation Brother    Heart disease Maternal Aunt    Dementia Maternal Aunt    Heart disease Maternal Uncle    Dementia Maternal Grandmother    Heart disease Brother    Atrial fibrillation Brother     Colon cancer Neg Hx    Breast cancer Neg Hx    Ovarian cancer Neg Hx    Pancreatic cancer Neg Hx    Endometrial cancer Neg Hx     Social History   Socioeconomic History   Marital status: Single    Spouse name: Not on file   Number of children: 1   Years of education: Not on file   Highest education level: Bachelor's degree (e.g., BA, AB, BS)  Occupational History   Occupation: retired   Tobacco Use   Smoking status: Former    Packs/day: 0.50    Years: 6.00    Pack years: 3.00    Types: Cigarettes    Quit date: 02/11/1974    Years since quitting: 46.8   Smokeless tobacco: Never  Vaping Use   Vaping Use: Never used  Substance and Sexual Activity   Alcohol use: Yes    Alcohol/week: 0.0 standard drinks    Comment: 2-3 drinks monthly   Drug use: Never   Sexual activity: Not Currently    Comment: Don't use not sexually active  Other Topics Concern   Not on file  Social History Narrative   Raised an adopted child on her own   Working part time reviewed documents   Social Determinants of Radio broadcast assistant Strain: Low Risk    Difficulty of Paying Living Expenses: Not hard at all  Food Insecurity: No Food Insecurity   Worried About Charity fundraiser in the Last Year: Never true   Arboriculturist in the Last Year: Never true  Transportation Needs: No Transportation Needs   Lack of Transportation (Medical): No   Lack of Transportation (Non-Medical): No  Physical Activity: Inactive   Days of Exercise per Week: 0 days   Minutes of Exercise per Session: 0 min  Stress: No Stress Concern Present   Feeling of Stress : Not at all  Social Connections: Moderately Integrated   Frequency of Communication with Friends and Family: More than three times a week   Frequency of Social Gatherings with Friends and Family: Twice a week   Attends Religious Services: More than 4 times per year   Active Member of [employer] or Organizations: Yes   Attends Music therapist:  More than 4 times per year   Marital Status: Never married    Current Medications:  Current Outpatient Medications:    atorvastatin (LIPITOR) 40 MG tablet, Take 1 tablet (40 mg total) by mouth daily., Disp: 90 tablet, Rfl: 1   buPROPion (WELLBUTRIN SR) 150 MG 12 hr tablet, TAKE 2 TABLETS BY MOUTH EVERY DAY (Patient taking differently: Take 150 mg by mouth 2 (two) times daily.), Disp: 180 tablet, Rfl: 1   citalopram (CELEXA) 40 MG tablet, TAKE 1 TABLET BY MOUTH EVERY DAY, Disp: 90 tablet, Rfl: 1   clindamycin-benzoyl peroxide (BENZACLIN) gel, Apply topically 2 (two) times daily. (Patient taking differently:  Apply 1 application topically 2 (two) times daily as needed (facial acne).), Disp: 50 g, Rfl: 0   clopidogrel (PLAVIX) 75 MG tablet, Take 1 tablet (75 mg total) by mouth daily., Disp: 30 tablet, Rfl: 1   Cyanocobalamin (VITAMIN B-12) 1000 MCG SUBL, Place 1,000 mcg under the tongue at bedtime., Disp: , Rfl:    levothyroxine (SYNTHROID) 75 MCG tablet, TAKE 1 TABLET(75 MCG) BY MOUTH DAILY BEFORE BREAKFAST, Disp: 90 tablet, Rfl: 1   megestrol (MEGACE) 40 MG tablet, Take 2 tablets (80 mg total) by mouth 2 (two) times daily., Disp: 120 tablet, Rfl: 1   Multiple Vitamin (MULTIVITAMIN WITH MINERALS) TABS tablet, Take 1 tablet by mouth daily., Disp: , Rfl:    polyvinyl alcohol (LIQUIFILM TEARS) 1.4 % ophthalmic solution, Place 1 drop into both eyes daily as needed for dry eyes., Disp: , Rfl:    rivaroxaban (XARELTO) 20 MG TABS tablet, Take 20 mg by mouth daily with supper., Disp: , Rfl:    Ubrogepant (UBRELVY) 100 MG TABS, Take 100 mg by mouth., Disp: , Rfl:    Vitamin D, Ergocalciferol, (DRISDOL) 1.25 MG (50000 UNIT) CAPS capsule, TAKE 1 CAPSULE BY MOUTH EVERY 7 DAYS (Patient taking differently: Take 50,000 Units by mouth every Monday. TAKE 1 CAPSULE BY MOUTH EVERY 7 DAYS), Disp: 12 capsule, Rfl: 1   traMADol (ULTRAM) 50 MG tablet, Take 1 tablet (50 mg total) by mouth every 12 (twelve) hours as needed  for up to 5 doses., Disp: 5 tablet, Rfl: 0  Review of Systems: Pertinent positives include appetite changes, vaginal bleeding, dizziness, bruising/bleeding easily. Denies fevers, chills, fatigue, unexplained weight changes. Denies hearing loss, neck lumps or masses, mouth sores, ringing in ears or voice changes. Denies cough or wheezing.  Denies shortness of breath. Denies chest pain or palpitations. Denies leg swelling. Denies abdominal distention, pain, blood in stools, constipation, diarrhea, nausea, vomiting, or early satiety. Denies pain with intercourse, dysuria, frequency, hematuria or incontinence. Denies hot flashes, pelvic pain or vaginal discharge.   Denies joint pain, back pain or muscle pain/cramps. Denies itching, rash, or wounds. Denies headaches, numbness or seizures. Denies swollen lymph nodes or glands. Denies anxiety, depression, confusion, or decreased concentration.  Physical Exam: BP 129/80 (BP Location: Right Arm, Patient Position: Sitting)   Pulse 75   Temp 98.1 F (36.7 C) (Oral)   Resp 16   Ht 5\' 5"  (1.651 m)   Wt 175 lb (79.4 kg)   SpO2 100%   BMI 29.12 kg/m  General: Alert, oriented, no acute distress. HEENT: Normocephalic, atraumatic, sclera anicteric.  Laboratory & Radiologic Studies: A. ENDOMETRIUM, CURETTAGE:  - Atypical hyperplasia, see comment   Assessment & Plan: CHEE DIMON is a 70 y.o. woman with at least complex atypical hyperplasia as well as ovarian cyst who presents today for follow-up after recent endometrial sampling.  Patient has done well from a postoperative standpoint other than her continued episodes of heavier bleeding.  I suspect that this is in relation to her hyperplasia in combination with being on both Plavix and a blood thinner.  I have suggested today that we increase to treatment dosing of progesterone.  I sent in a new prescription for Megace.  I have discussed with one of our hematologist and the risk of VTE in  the setting of being on both Plavix and anticoagulation is very low even on high-dose progesterone.  We discussed other side effects that can be associated with high-dose progesterone.  In my review of the patient's ultrasound  today, the cyst on her left ovary appears smaller in size.  There is a right solid appearing lesion in the adnexa although I favor this to be associated with the uterus.  I will wait for the radiologist review.  As long as there are not not concerning features in the adnexa on ultrasound, then the patient and I discussed plan to defer definitive surgery until she is 9 months out from her stroke, which would be in mid December.  With regard to the diagnosis of at least complex atypical hyperplasia.  I feel strongly that we should proceed with the most minimal surgery possible when she goes for surgery in December.  I discussed that typically in the setting of complex atypical hyperplasia I would either perform sentinel lymph node biopsy or I would plan to send the uterus for the pathologist to review to help guide whether lymph node removal was necessary if a malignancy was seen.  I recommend that we proceed with total hysterectomy and BSO and plan any adjuvant treatment based on final pathology results, if they show malignancy, based on tissue that we have.  I will asked Melissa to call the patient in the next couple of days to select a surgery date for December.  We will plan to see the patient back in early December for a preoperative visit.  45 minutes of total time was spent for this patient encounter, including preparation, face-to-face counseling with the patient and coordination of care, and documentation of the encounter.  Jeral Pinch, MD  Division of Gynecologic Oncology  Department of Obstetrics and Gynecology  [facility]

## 2020-11-22 ENCOUNTER — Encounter: Payer: Self-pay | Admitting: Family Medicine

## 2020-11-22 ENCOUNTER — Encounter: Payer: Self-pay | Admitting: Gynecologic Oncology

## 2020-11-22 ENCOUNTER — Telehealth: Payer: Self-pay

## 2020-11-22 ENCOUNTER — Telehealth: Payer: Self-pay | Admitting: *Deleted

## 2020-11-22 ENCOUNTER — Ambulatory Visit: Payer: Medicare Other

## 2020-11-22 NOTE — Telephone Encounter (Signed)
Fax records and surgical optimization form to the patient's PCP and cardiology

## 2020-11-22 NOTE — Telephone Encounter (Signed)
Spoke with Natalie [name] regarding surgical dates for December. Patient would like to proceed with surgery on December 15th, 2022. [name], NP notified.  Informed patient that our office received a 90 day prescription request from [facility] for 40mg  megace tablets. Patient would not like this since she will only need a 2 months supply. [facility] will be notified. Instructed patient to call with any questions or concerns.

## 2020-11-27 ENCOUNTER — Ambulatory Visit: Payer: Medicare Other

## 2020-11-29 ENCOUNTER — Ambulatory Visit: Payer: Medicare Other

## 2020-11-29 ENCOUNTER — Other Ambulatory Visit: Payer: Self-pay

## 2020-11-29 ENCOUNTER — Emergency Department: Payer: Medicare Other

## 2020-11-29 ENCOUNTER — Encounter: Payer: Self-pay | Admitting: Gynecologic Oncology

## 2020-11-29 DIAGNOSIS — R9389 Abnormal findings on diagnostic imaging of other specified body structures: Secondary | ICD-10-CM | POA: Diagnosis not present

## 2020-11-29 DIAGNOSIS — Z7901 Long term (current) use of anticoagulants: Secondary | ICD-10-CM | POA: Diagnosis not present

## 2020-11-29 DIAGNOSIS — N939 Abnormal uterine and vaginal bleeding, unspecified: Secondary | ICD-10-CM | POA: Diagnosis not present

## 2020-11-29 DIAGNOSIS — I4891 Unspecified atrial fibrillation: Secondary | ICD-10-CM | POA: Insufficient documentation

## 2020-11-29 DIAGNOSIS — R102 Pelvic and perineal pain: Secondary | ICD-10-CM | POA: Diagnosis not present

## 2020-11-29 DIAGNOSIS — N950 Postmenopausal bleeding: Secondary | ICD-10-CM | POA: Diagnosis not present

## 2020-11-29 DIAGNOSIS — Z79899 Other long term (current) drug therapy: Secondary | ICD-10-CM | POA: Diagnosis not present

## 2020-11-29 DIAGNOSIS — Z87891 Personal history of nicotine dependence: Secondary | ICD-10-CM | POA: Diagnosis not present

## 2020-11-29 DIAGNOSIS — E039 Hypothyroidism, unspecified: Secondary | ICD-10-CM | POA: Insufficient documentation

## 2020-11-29 DIAGNOSIS — N83202 Unspecified ovarian cyst, left side: Secondary | ICD-10-CM | POA: Diagnosis not present

## 2020-11-29 DIAGNOSIS — D259 Leiomyoma of uterus, unspecified: Secondary | ICD-10-CM | POA: Diagnosis not present

## 2020-11-29 LAB — BASIC METABOLIC PANEL
Anion gap: 7 (ref 5–15)
BUN: 19 mg/dL (ref 8–23)
CO2: 22 mmol/L (ref 22–32)
Calcium: 8.8 mg/dL — ABNORMAL LOW (ref 8.9–10.3)
Chloride: 109 mmol/L (ref 98–111)
Creatinine, Ser: 0.83 mg/dL (ref 0.44–1.00)
GFR, Estimated: >60 mL/min (ref >60)
Glucose, Bld: 110 mg/dL — ABNORMAL HIGH (ref 70–99)
Potassium: 3.8 mmol/L (ref 3.5–5.1)
Sodium: 138 mmol/L (ref 135–145)

## 2020-11-29 LAB — TYPE AND SCREEN
ABO/RH(D): A POS
Antibody Screen: NEGATIVE

## 2020-11-29 LAB — CBC
HCT: 30.8 % — ABNORMAL LOW (ref 36.0–46.0)
Hemoglobin: 10.5 g/dL — ABNORMAL LOW (ref 12.0–15.0)
MCH: 32.6 pg (ref 26.0–34.0)
MCHC: 34.1 g/dL (ref 30.0–36.0)
MCV: 95.7 fL (ref 80.0–100.0)
Platelets: 268 10*3/uL (ref 150–400)
RBC: 3.22 MIL/uL — ABNORMAL LOW (ref 3.87–5.11)
RDW: 12.7 % (ref 11.5–15.5)
WBC: 6.3 10*3/uL (ref 4.0–10.5)
nRBC: 0 % (ref 0.0–0.2)

## 2020-11-29 LAB — APTT: aPTT: 30 seconds (ref 24–36)

## 2020-11-29 NOTE — ED Provider Notes (Signed)
Emergency Medicine Provider Triage Evaluation Note  Natalie [name] , a 70 y.o. female  was evaluated in triage.  Pt complains of abnormal vaginal bleeding.  Patient is currently on a course of progesterone, presumably bleeding.  She had an inconclusive endometrial biopsy done in July.  She is scheduled for a TAH in December.  She reports onset of vaginal bleeding today.  She denies any fevers, chills, sweats.  Review of Systems  Positive: Abnormal vaginal bleeding Negative: FCS  Physical Exam  BP 113/75   Pulse 85   Temp 98.9 F (37.2 C) (Oral)   Resp 18   Ht 5\' 5"  (1.651 m)   Wt 79.4 kg   SpO2 100%   BMI 29.12 kg/m  Gen:   Awake, no distress NAD Resp:  Normal effort CTA MSK:   Moves extremities without difficulty  Other:  CVS: RRR  Medical Decision Making  Medically screening exam initiated at 6:27 PM.  Appropriate orders placed.  Natalie [name] was informed that the remainder of the evaluation will be completed by another provider, this initial triage assessment does not replace that evaluation, and the importance of remaining in the ED until their evaluation is complete.  Patient ED evaluation of abnormal vaginal bleeding despite current treatment with progesterone.   Melvenia Needles, PA-C 11/29/20 1829    Vanessa Glidden, MD 11/29/20 [phone]

## 2020-11-29 NOTE — ED Triage Notes (Signed)
Pt to ED via POV from home. Pt stating on the 29th of September had a D&C due to possibility she had cancer because biopsy in July was inconclusive. Pt stating 2wks after D&C she was placed on progesterone. Pt stating started having increased heavy bleeding. Pt stating also having large blood clots. Pt also on blood thinners from previous stroke. Pt states abdominal cramping. Pt statin gon 4th pad today. Pt scheduled for hysterectomy scheduled in December.

## 2020-11-30 ENCOUNTER — Telehealth: Payer: Self-pay

## 2020-11-30 ENCOUNTER — Emergency Department
Admission: EM | Admit: 2020-11-30 | Discharge: 2020-11-30 | Disposition: A | Discharge status: Home / Self Care | Payer: Medicare Other | Attending: Emergency Medicine | Admitting: Emergency Medicine

## 2020-11-30 DIAGNOSIS — N950 Postmenopausal bleeding: Secondary | ICD-10-CM | POA: Diagnosis not present

## 2020-11-30 DIAGNOSIS — N939 Abnormal uterine and vaginal bleeding, unspecified: Secondary | ICD-10-CM

## 2020-11-30 DIAGNOSIS — D259 Leiomyoma of uterus, unspecified: Secondary | ICD-10-CM | POA: Diagnosis not present

## 2020-11-30 DIAGNOSIS — N83202 Unspecified ovarian cyst, left side: Secondary | ICD-10-CM | POA: Diagnosis not present

## 2020-11-30 DIAGNOSIS — R9389 Abnormal findings on diagnostic imaging of other specified body structures: Secondary | ICD-10-CM | POA: Diagnosis not present

## 2020-11-30 IMAGING — US US PELVIS COMPLETE WITH TRANSVAGINAL
1 series · 13 of 25 positions shown · non-contrast
Comparison: [DATE]

CLINICAL DATA: Postmenopausal vaginal bleeding.

EXAM:
TRANSABDOMINAL AND TRANSVAGINAL ULTRASOUND OF PELVIS
TECHNIQUE: Both transabdominal and transvaginal ultrasound examinations of the
pelvis were performed. Transabdominal technique was performed for
global imaging of the pelvis including uterus, ovaries, adnexal
regions, and pelvic cul-de-sac. It was necessary to proceed with
endovaginal exam following the transabdominal exam to visualize the
uterus, endometrium, bilateral ovaries and bilateral adnexa.

[Series 1: us pelvic complete with transvaginal · 13 of 120 slices shown]
[im 1/120]
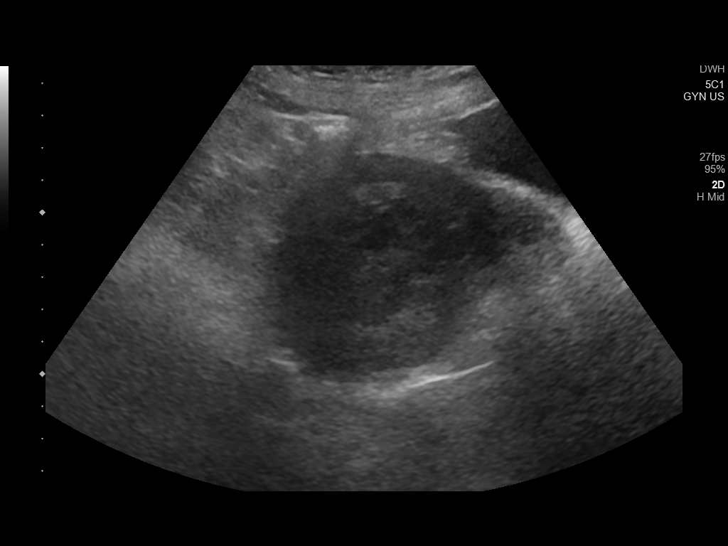
[im 10/120]
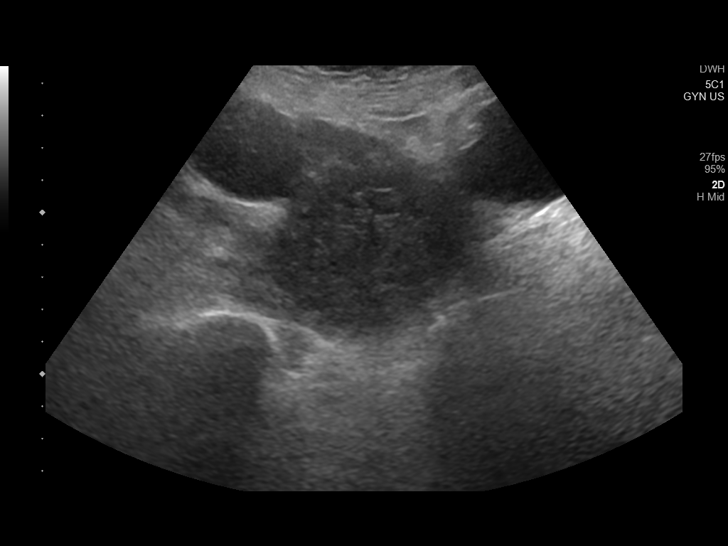
[im 20/120]
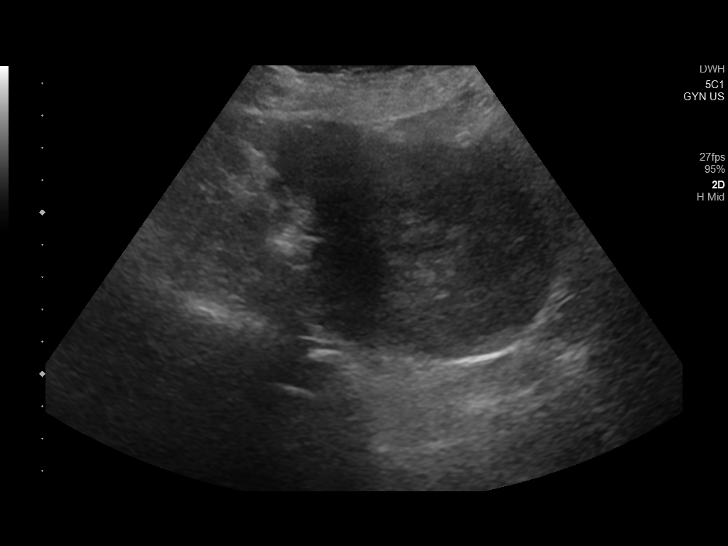
[im 30/120]
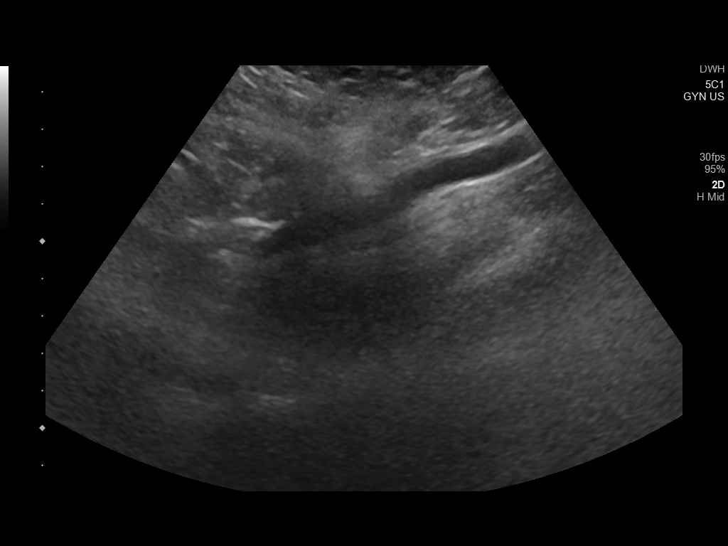
[im 40/120]
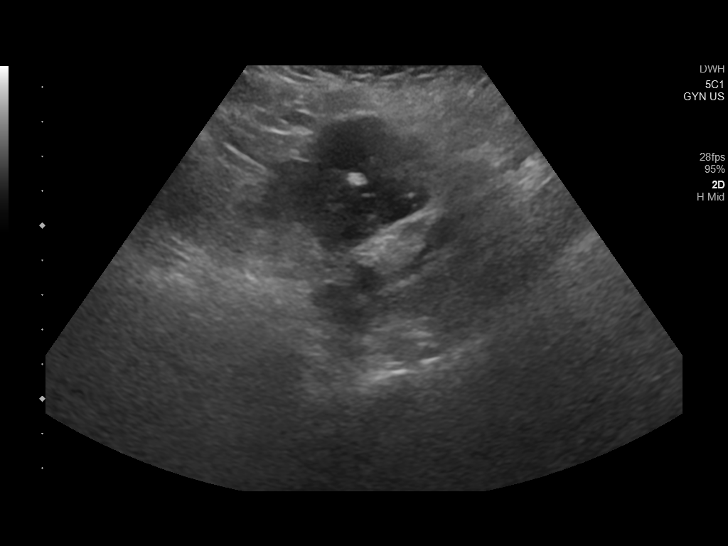
[im 50/120]
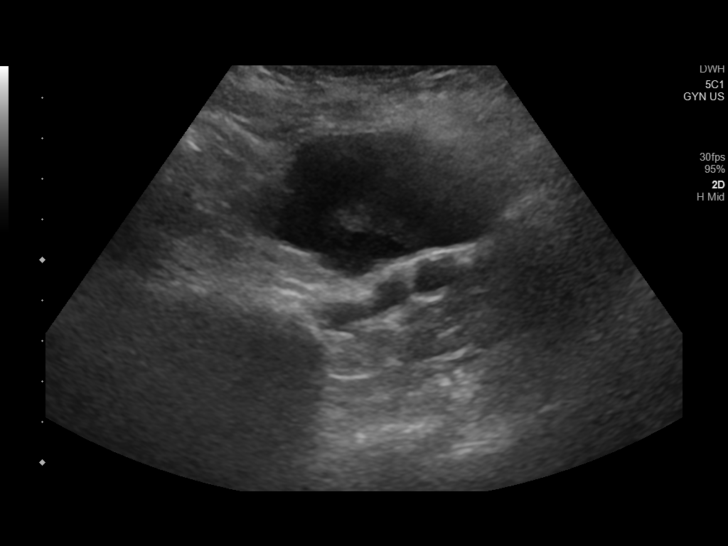
[im 60/120]
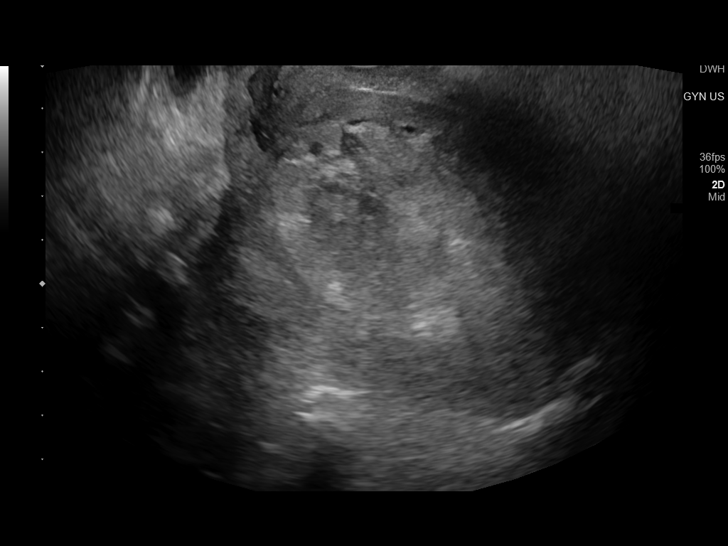
[im 70/120]
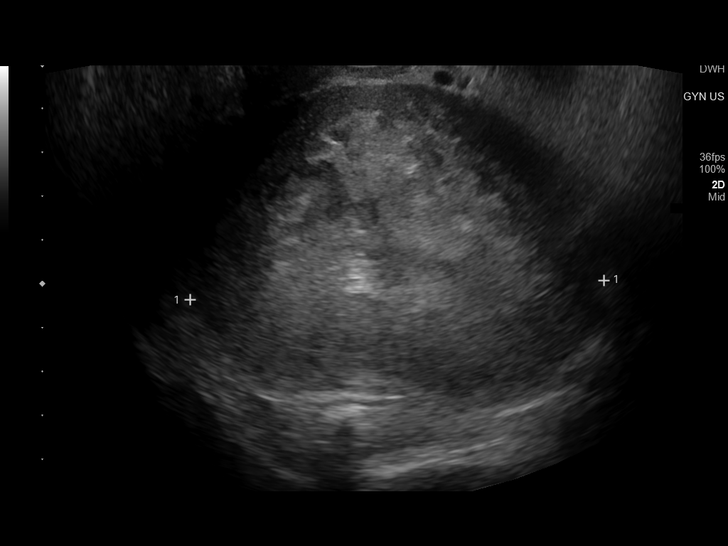
[im 80/120]
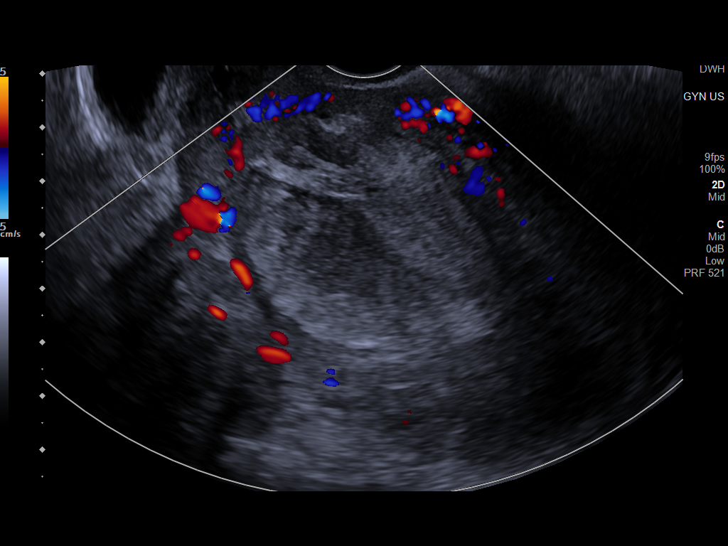
[im 90/120]
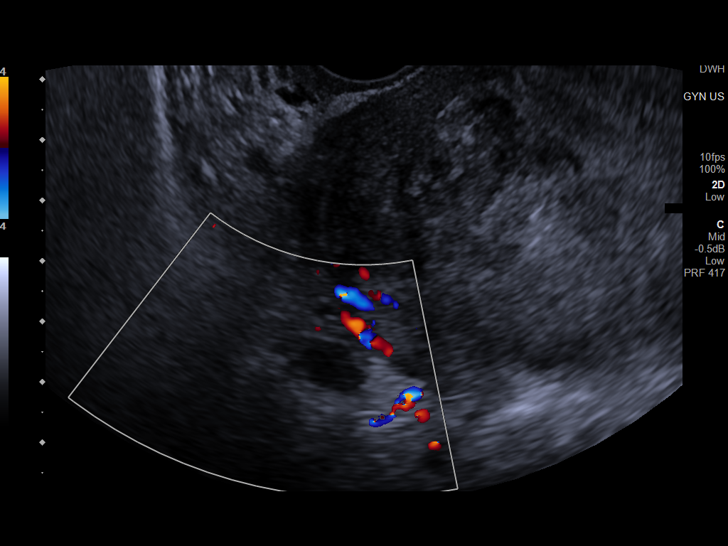
[im 100/120]
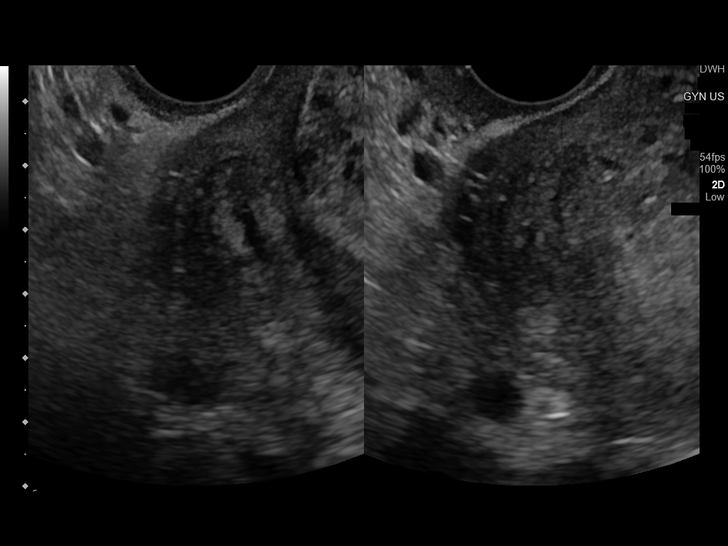
[im 110/120]
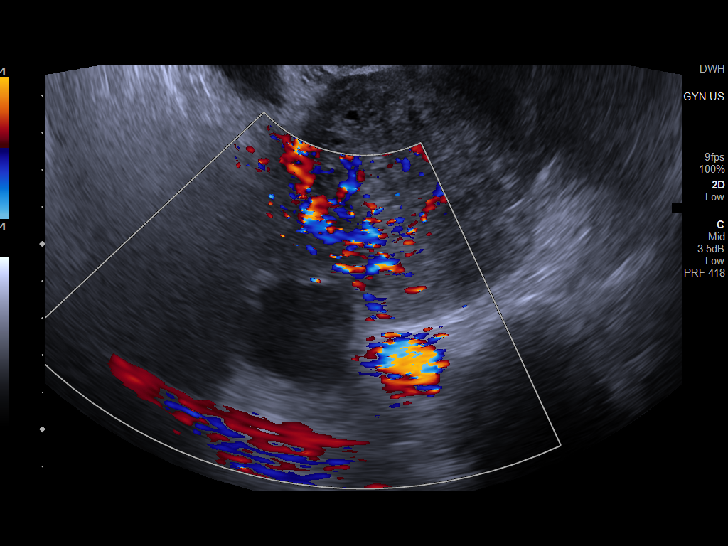
[im 120/120]
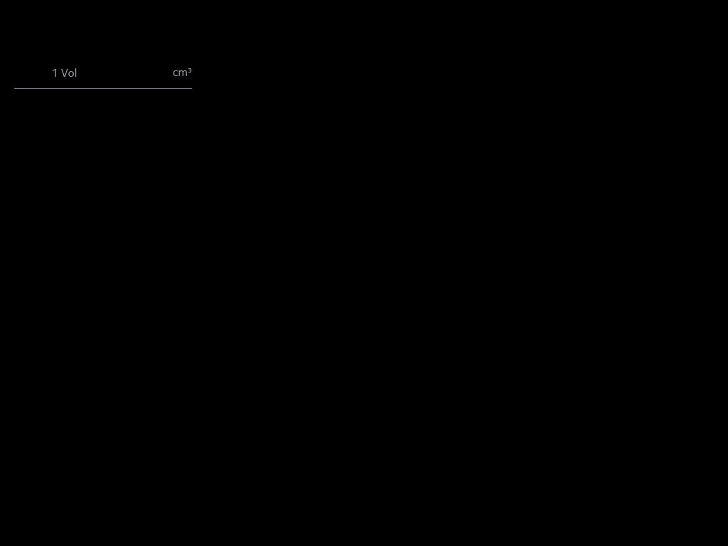

[13 of 25 positions shown; findings below may reference images not displayed]

FINDINGS: Uterus

Measurements: 9.3 cm x 6.6 cm x 9.1 cm = volume: 294.2 mL. A 3.7 cm
x 3.2 cm x 3.6 cm heterogeneous uterine fibroid is seen within the
anterior aspect of the uterine parenchyma on the right.

Endometrium

Thickness: 44 mm. Heterogeneous in appearance. No focal abnormality
visualized.

Right ovary

Measurements: 1.8 cm x 1.4 cm x 1.2 cm = volume: 1.6 mL. Normal
appearance/no adnexal mass.

Left ovary

Measurements: 6.8 cm x 3.8 cm x 6.0 cm = volume: 79.2 mL. A 4.9 cm x
3.5 cm x 5.3 cm complex anechoic structure is seen within the left
ovary. No flow is noted within this region on color Doppler
evaluation.

Other findings

No abnormal free fluid.
IMPRESSION: 1. Heterogeneous, predominant stable uterine fibroid.
2. Thickened, heterogeneous endometrium.
3. Complex left adnexal cyst which likely corresponds to the left
adnexal cyst seen on the prior study.

## 2020-11-30 NOTE — ED Provider Notes (Signed)
[facility] Emergency Department Provider Note   ____________________________________________   Event Date/Time   First MD Initiated Contact with Patient 11/30/20 [phone]     (approximate)  I have reviewed the triage vital signs and the nursing notes.   HISTORY  Chief Complaint Vaginal Bleeding    HPI Natalie [name] is a 70 y.o. female who presents to the ED from home with a chief complaint of vaginal bleeding.  Patient is being seen at [facility], GYN for probable endometrial cancer.  She had a biopsy in July which is inconclusive; D&C in September.  She is currently on 160 mg daily progesterone as well as Xarelto and Plavix for her CVA in March 2022.  States she was preemptively placed on Xarelto because she has a strong family history of atrial fibrillation although her personal work-up was negative.  Reports cycles of waxing/waning heavy bleeding on progesterone.  Scheduled for hysterectomy December 15.  Denies fever, cough, chest pain, shortness of breath, nausea, vomiting or dizziness.  Endorses occasional pelvic cramping.     Past Medical History:  Diagnosis Date   Acute bronchospasm due to viral infection 2020   Due to pollen   Anemia    history of   CVA (cerebral vascular accident) ([place]) 02/28/2020   and 04/19/20   Depressive disorder    Epistaxis    Fatigue    Hearing loss    History of loop recorder    Hypothyroidism    Memory change    Migraine    Obesity (BMI 30.0-34.9)    lost 60 lbs   Osteoarthritis    Osteoporosis    Polyuria    Premature menopause    Seizures ([place]) 1994   history of mini seizures, possible migraine induced    Patient Active Problem List   Diagnosis Date Noted   Complex atypical endometrial hyperplasia 11/05/2020   Post-menopausal bleeding    Thickened endometrium 08/13/2020   Acute ITP ([place]) 05/02/2020   Other intra-abdominal and pelvic swelling, mass and lump 04/26/2020   Pelvic mass in female  04/25/2020   Ovarian cancer screening 04/25/2020   Ovarian mass, left 04/25/2020   Stroke (cerebrum) ([place]) 04/20/2020   Acute CVA (cerebrovascular accident) ([place]) 04/19/2020   CVA (cerebral vascular accident) ([place]) 04/19/2020   History of ischemic multifocal posterior circulation stroke 02/29/2020   Thrombocytopenia ([place]) 02/28/2020   HPV test positive 07/08/2015   Difficulty hearing 06/28/2015   Arthritis, degenerative 06/28/2015   Obesity, Class I, BMI 30-34.9 06/28/2015   Paresthesia 06/28/2015   Purpura, nonthrombopenic ([place]) 06/28/2015   Other specified hypothyroidism 08/09/2014   Migraine without aura and without status migrainosus, not intractable 09/19/2008   Moderate major depression ([place]) 09/07/2006   OP (osteoporosis) 09/07/2006    Past Surgical History:  Procedure Laterality Date   ABLATION  2006   BUBBLE STUDY  04/23/2020   Procedure: BUBBLE STUDY;  Surgeon: Larey Dresser, MD;  Location: [place];  Service: Cardiovascular;;   CATARACT EXTRACTION W/ INTRAOCULAR LENS IMPLANT Bilateral    COLONOSCOPY     COLONOSCOPY WITH PROPOFOL N/A 12/22/2018   Procedure: COLONOSCOPY WITH PROPOFOL;  Surgeon: Virgel Manifold, MD;  Location: [facility] ENDOSCOPY;  Service: Endoscopy;  Laterality: N/A;   EYE SURGERY  Spring 2018   Cataracts both eyes   HYSTEROSCOPY WITH D & C N/A 11/01/2020   Procedure: DILATATION AND CURETTAGE /HYSTEROSCOPY WITH MYOSURE;  Surgeon: Lafonda Mosses, MD;  Location: [facility];  Service: Gynecology;  Laterality:  N/A;   LOOP RECORDER INSERTION N/A 04/23/2020   Procedure: LOOP RECORDER INSERTION;  Surgeon: Evans Lance, MD;  Location: [place] CV LAB;  Service: Cardiovascular;  Laterality: N/A;   TEE WITHOUT CARDIOVERSION N/A 04/23/2020   Procedure: TRANSESOPHAGEAL ECHOCARDIOGRAM (TEE);  Surgeon: Larey Dresser, MD;  Location: [facility] ENDOSCOPY;  Service: Cardiovascular;  Laterality: N/A;   TONSILLECTOMY      Prior to Admission medications    Medication Sig Start Date End Date Taking? Authorizing Provider  atorvastatin (LIPITOR) 40 MG tablet Take 1 tablet (40 mg total) by mouth daily. 08/16/20   [name], MD  buPROPion (WELLBUTRIN SR) 150 MG 12 hr tablet TAKE 2 TABLETS BY MOUTH EVERY DAY Patient taking differently: Take 150 mg by mouth 2 (two) times daily. 08/30/20   [name], MD  citalopram (CELEXA) 40 MG tablet TAKE 1 TABLET BY MOUTH EVERY DAY 08/30/20   [name], MD  clindamycin-benzoyl peroxide (BENZACLIN) gel Apply topically 2 (two) times daily. Patient taking differently: Apply 1 application topically 2 (two) times daily as needed (facial acne). 05/10/19   [name], MD  clopidogrel (PLAVIX) 75 MG tablet Take 1 tablet (75 mg total) by mouth daily. 04/24/20 04/24/21  Caren Griffins, MD  Cyanocobalamin (VITAMIN B-12) 1000 MCG SUBL Place 1,000 mcg under the tongue at bedtime.    [provider]  levothyroxine (SYNTHROID) 75 MCG tablet TAKE 1 TABLET(75 MCG) BY MOUTH DAILY BEFORE BREAKFAST 11/21/20   Ancil Boozer, Drue Stager, MD  megestrol (MEGACE) 40 MG tablet Take 2 tablets (80 mg total) by mouth 2 (two) times daily. 11/21/20   Lafonda Mosses, MD  Multiple Vitamin (MULTIVITAMIN WITH MINERALS) TABS tablet Take 1 tablet by mouth daily.    [provider]  polyvinyl alcohol (LIQUIFILM TEARS) 1.4 % ophthalmic solution Place 1 drop into both eyes daily as needed for dry eyes.    [provider]  rivaroxaban (XARELTO) 20 MG TABS tablet Take 20 mg by mouth daily with supper.    [provider]  Ubrogepant (UBRELVY) 100 MG TABS Take 100 mg by mouth.    [provider]  Vitamin D, Ergocalciferol, (DRISDOL) 1.25 MG (50000 UNIT) CAPS capsule TAKE 1 CAPSULE BY MOUTH EVERY 7 DAYS Patient taking differently: Take 50,000 Units by mouth every Monday. TAKE 1 CAPSULE BY MOUTH EVERY 7 DAYS 09/25/20   [name], MD    Allergies Sulfamethoxazole-trimethoprim  Family History   Problem Relation Age of Onset   Early death Father        suicide   Depression Father    Diabetes Father    Cancer Father        prostate   Hearing loss Father        due to war   Alzheimer's disease Mother    Heart disease Brother    Atrial fibrillation Brother    Heart disease Maternal Aunt    Dementia Maternal Aunt    Heart disease Maternal Uncle    Dementia Maternal Grandmother    Heart disease Brother    Atrial fibrillation Brother    Colon cancer Neg Hx    Breast cancer Neg Hx    Ovarian cancer Neg Hx    Pancreatic cancer Neg Hx    Endometrial cancer Neg Hx     Social History Social History   Tobacco Use   Smoking status: Former    Packs/day: 0.50    Years: 6.00    Pack years: 3.00    Types:  Cigarettes    Quit date: 02/11/1974    Years since quitting: 46.8   Smokeless tobacco: Never  Vaping Use   Vaping Use: Never used  Substance Use Topics   Alcohol use: Yes    Alcohol/week: 0.0 standard drinks    Comment: 2-3 drinks monthly   Drug use: Never    Review of Systems  Constitutional: No fever/chills Eyes: No visual changes. ENT: No sore throat. Cardiovascular: Denies chest pain. Respiratory: Denies shortness of breath. Gastrointestinal: No abdominal pain.  No nausea, no vomiting.  No diarrhea.  No constipation. Genitourinary: Positive for vaginal bleeding.  Negative for dysuria. Musculoskeletal: Negative for back pain. Skin: Negative for rash. Neurological: Negative for headaches, focal weakness or numbness.   ____________________________________________   PHYSICAL EXAM:  VITAL SIGNS: ED Triage Vitals  Enc Vitals Group     BP 11/29/20 1811 113/75     Pulse Rate 11/29/20 1808 85     Resp 11/29/20 1808 18     Temp 11/29/20 1808 98.9 F (37.2 C)     Temp Source 11/29/20 1808 Oral     SpO2 11/29/20 1808 100 %     Weight 11/29/20 1810 175 lb (79.4 kg)     Height 11/29/20 1810 5\' 5"  (1.651 m)     Head Circumference --      Peak Flow --       Pain Score 11/29/20 1809 3     Pain Loc --      Pain Edu? --      Excl. in [place]? --     Constitutional: Alert and oriented. Well appearing and in no acute distress. Eyes: Conjunctivae are normal. PERRL. EOMI. Head: Atraumatic. Nose: No congestion/rhinnorhea. Mouth/Throat: Mucous membranes are moist.   Neck: No stridor.   Cardiovascular: Normal rate, regular rhythm. Grossly normal heart sounds.  Good peripheral circulation. Respiratory: Normal respiratory effort.  No retractions. Lungs CTAB. Gastrointestinal: Soft and nontender to light or deep palpation. No distention. No abdominal bruits. No CVA tenderness. Musculoskeletal: No lower extremity tenderness nor edema.  No joint effusions. Neurologic:  Normal speech and language. No gross focal neurologic deficits are appreciated. No gait instability. Skin:  Skin is warm, dry and intact. No rash noted. Psychiatric: Mood and affect are normal. Speech and behavior are normal.  ____________________________________________   LABS (all labs ordered are listed, but only abnormal results are displayed)  Labs Reviewed  CBC - Abnormal; Notable for the following components:      Result Value   RBC 3.22 (*)    Hemoglobin 10.5 (*)    HCT 30.8 (*)    All other components within normal limits  BASIC METABOLIC PANEL - Abnormal; Notable for the following components:   Glucose, Bld 110 (*)    Calcium 8.8 (*)    All other components within normal limits  APTT  TYPE AND SCREEN   ____________________________________________  EKG  None ____________________________________________  RADIOLOGY I, Joshua Soulier J, personally viewed and evaluated these images (plain radiographs) as part of my medical decision making, as well as reviewing the written report by the radiologist.  ED MD interpretation: Stable ultrasound from 10/19 demonstrating uterine fibroid, thickened, heterogeneous endometrium and complex left adnexal cyst  Official radiology  report(s): US PELVIC COMPLETE WITH TRANSVAGINAL  Result Date: 11/30/2020 CLINICAL DATA:  Postmenopausal vaginal bleeding. EXAM: TRANSABDOMINAL AND TRANSVAGINAL ULTRASOUND OF PELVIS TECHNIQUE: Both transabdominal and transvaginal ultrasound examinations of the pelvis were performed. Transabdominal technique was performed for global imaging of the pelvis including uterus,  ovaries, adnexal regions, and pelvic cul-de-sac. It was necessary to proceed with endovaginal exam following the transabdominal exam to visualize the uterus, endometrium, bilateral ovaries and bilateral adnexa. COMPARISON:  November 21, 2020 FINDINGS: Uterus Measurements: 9.3 cm x 6.6 cm x 9.1 cm = volume: 294.2 mL. A 3.7 cm x 3.2 cm x 3.6 cm heterogeneous uterine fibroid is seen within the anterior aspect of the uterine parenchyma on the right. Endometrium Thickness: 44 mm. Heterogeneous in appearance. No focal abnormality visualized. Right ovary Measurements: 1.8 cm x 1.4 cm x 1.2 cm = volume: 1.6 mL. Normal appearance/no adnexal mass. Left ovary Measurements: 6.8 cm x 3.8 cm x 6.0 cm = volume: 79.2 mL. A 4.9 cm x 3.5 cm x 5.3 cm complex anechoic structure is seen within the left ovary. No flow is noted within this region on color Doppler evaluation. Other findings No abnormal free fluid. IMPRESSION: 1. Heterogeneous, predominant stable uterine fibroid. 2. Thickened, heterogeneous endometrium. 3. Complex left adnexal cyst which likely corresponds to the left adnexal cyst seen on the prior study. Electronically Signed   By: Virgina Norfolk M.D.   On: 11/30/2020 01:44    ____________________________________________   PROCEDURES  Procedure(s) performed (including Critical Care):  Procedures  Pelvic exam: External exam unremarkable without rashes, lesions or vesicles.  Speculum exam reveals mild vaginal bleeding; cervical os is closed.  Bimanual exam unremarkable.  ____________________________________________   INITIAL IMPRESSION /  ASSESSMENT AND PLAN / ED COURSE  As part of my medical decision making, I reviewed the following data within the [place] History obtained from family, Nursing notes reviewed and incorporated, Labs reviewed, Old chart reviewed, Radiograph reviewed, and Notes from prior ED visits     70 year old female presenting with vaginal bleeding, currently followed by GYN/onc for probable endometrial cancer.  Case is complicated by the fact that patient is on dual anticoagulants as well as high dose of progesterone.  Laboratory results demonstrate slight dip in H/H compared to 2 weeks ago but nowhere near transfusion levels.  Have discussed case with on-call GYN Dr. Leafy Ro who does not recommend holding patient's anticoagulants nor adjusting progesterone dosage.  Patient has a phone call into her GYN and expects a phone call tomorrow.  I have offered to message her cardiologist for directions regarding her anticoagulants.  Orthostatic vital signs were normal.  Strict return precautions given.  Patient and her daughter verbalized understanding and agree with plan of care.      ____________________________________________   FINAL CLINICAL IMPRESSION(S) / ED DIAGNOSES  Final diagnoses:  Abnormal uterine bleeding     ED Discharge Orders     None        Note:  This document was prepared using Dragon voice recognition software and may include unintentional dictation errors.    Paulette Blanch, MD 11/30/20 [phone]

## 2020-11-30 NOTE — Telephone Encounter (Signed)
Copied from [place] [id number]. Topic: General - Other >> Nov 30, 2020 12:21 PM Tessa Lerner A wrote: Reason for CRM: Sharyn Lull with GYN Gynecology has called to share that they will be moving patient's surgery up to potentially 12/18/20 or 12/25/20 due to the patient's bleeding  Patient will be having a robotic hysterectomy  Please contact to discuss further

## 2020-11-30 NOTE — ED Notes (Signed)
Orthostatic vital signs- Lying: HR 69, BP 124/74 Sitting: HR 77, BP 129/74 Standing: HR 90, BP 127/86

## 2020-11-30 NOTE — Discharge Instructions (Addendum)
I have messaged Dr. Clayborn Bigness and his office should be in touch with recommendations regarding your blood thinners.  Drink plenty of fluids daily.  Return to the ER for worsening symptoms, persistent vomiting, fainting, soaking more than 1 pad per hour, or other concerns.

## 2020-12-03 ENCOUNTER — Telehealth: Payer: Self-pay | Admitting: *Deleted

## 2020-12-03 ENCOUNTER — Ambulatory Visit: Payer: Medicare Other | Admitting: Gynecologic Oncology

## 2020-12-03 NOTE — Telephone Encounter (Signed)
Return the patient's call and explained that per Dr Berline Lopes cancel appt and bring patient in closer to surgery date. Patient called earlier and stated "I have a cough, hoariness, and I'm tired. No fever. Should I came in today?" Patient verbalized understanding

## 2020-12-03 NOTE — Progress Notes (Unsigned)
Gynecologic Oncology Return Clinic Visit  12/03/20  Reason for Visit: ***  Treatment History: CT A/P in 04/2020 showed enlarged fibroid uterus; complex cystic mass in left ovary measuring up to 5cm. Pelvic ultrasound 05/01/20: Enlarged multi-fibroid uterus, endometrium 3.30mm. Left ovary measures up to 7cm with a 5.8 x 4.1 x 4cm dominant septated cyst.  CA-125 04/2020: 592 08/10/2020: Pelvic ultrasound showed thickened heterogenous endometrial complex with somewhat nodular margins associated with a 3.8 cm diameter fluid collection within the endometrial canal.  8.2 cm cyst of the left ovary. 08/2020: 731 08/27/2020: Endometrial biopsy showing minute fragments of benign inactive endometrium, mostly blood and fibrin 11/01/2020: Dilation of the cervix, hysteroscopy, and hysteroscopic sampling of the endometrium.  Final pathology showed at least complex atypical hyperplasia. 11/29/2020: Patient seen in the emergency department secondary to heavy bleeding. Hgb 10.5 down from 12.4.  11/29/20: Pelvic ultrasound shows thickened endometrial lining measuring 4.4 cm.  Normal-appearing right ovary.  Complex left adnexal cyst measures up to f 5.3 cm.  No flow seen.  Interval History: ***  Past Medical/Surgical History: Past Medical History:  Diagnosis Date   Acute bronchospasm due to viral infection 2020   Due to pollen   Anemia    history of   CVA (cerebral vascular accident) ([place]) 02/28/2020   and 04/19/20   Depressive disorder    Epistaxis    Fatigue    Hearing loss    History of loop recorder    Hypothyroidism    Memory change    Migraine    Obesity (BMI 30.0-34.9)    lost 60 lbs   Osteoarthritis    Osteoporosis    Polyuria    Premature menopause    Seizures ([place]) 1994   history of mini seizures, possible migraine induced    Past Surgical History:  Procedure Laterality Date   ABLATION  2006   BUBBLE STUDY  04/23/2020   Procedure: BUBBLE STUDY;  Surgeon: Larey Dresser, MD;   Location: [place];  Service: Cardiovascular;;   CATARACT EXTRACTION W/ INTRAOCULAR LENS IMPLANT Bilateral    COLONOSCOPY     COLONOSCOPY WITH PROPOFOL N/A 12/22/2018   Procedure: COLONOSCOPY WITH PROPOFOL;  Surgeon: Virgel Manifold, MD;  Location: [facility] ENDOSCOPY;  Service: Endoscopy;  Laterality: N/A;   EYE SURGERY  Spring 2018   Cataracts both eyes   HYSTEROSCOPY WITH D & C N/A 11/01/2020   Procedure: DILATATION AND CURETTAGE /HYSTEROSCOPY WITH MYOSURE;  Surgeon: Lafonda Mosses, MD;  [facility];  Service: Gynecology;  Laterality: N/A;   LOOP RECORDER INSERTION N/A 04/23/2020   Procedure: LOOP RECORDER INSERTION;  Surgeon: Evans Lance, MD;  Location: [place] CV LAB;  Service: Cardiovascular;  Laterality: N/A;   TEE WITHOUT CARDIOVERSION N/A 04/23/2020   Procedure: TRANSESOPHAGEAL ECHOCARDIOGRAM (TEE);  Surgeon: Larey Dresser, MD;  Location: [facility] ENDOSCOPY;  Service: Cardiovascular;  Laterality: N/A;   TONSILLECTOMY      Family History  Problem Relation Age of Onset   Early death Father        suicide   Depression Father    Diabetes Father    Cancer Father        prostate   Hearing loss Father        due to war   Alzheimer's disease Mother    Heart disease Brother    Atrial fibrillation Brother    Heart disease Maternal Aunt    Dementia Maternal Aunt    Heart disease Maternal Uncle  Dementia Maternal Grandmother    Heart disease Brother    Atrial fibrillation Brother    Colon cancer Neg Hx    Breast cancer Neg Hx    Ovarian cancer Neg Hx    Pancreatic cancer Neg Hx    Endometrial cancer Neg Hx     Social History   Socioeconomic History   Marital status: Single    Spouse name: Not on file   Number of children: 1   Years of education: Not on file   Highest education level: Bachelor's degree (e.g., BA, AB, BS)  Occupational History   Occupation: retired   Tobacco Use   Smoking status: Former    Packs/day: 0.50    Years: 6.00    Pack  years: 3.00    Types: Cigarettes    Quit date: 02/11/1974    Years since quitting: 46.8   Smokeless tobacco: Never  Vaping Use   Vaping Use: Never used  Substance and Sexual Activity   Alcohol use: Yes    Alcohol/week: 0.0 standard drinks    Comment: 2-3 drinks monthly   Drug use: Never   Sexual activity: Not Currently    Comment: Don't use not sexually active  Other Topics Concern   Not on file  Social History Narrative   Raised an adopted child on her own   Working part time reviewed documents   Social Determinants of Radio broadcast assistant Strain: Low Risk    Difficulty of Paying Living Expenses: Not hard at all  Food Insecurity: No Food Insecurity   Worried About Charity fundraiser in the Last Year: Never true   Arboriculturist in the Last Year: Never true  Transportation Needs: No Transportation Needs   Lack of Transportation (Medical): No   Lack of Transportation (Non-Medical): No  Physical Activity: Inactive   Days of Exercise per Week: 0 days   Minutes of Exercise per Session: 0 min  Stress: No Stress Concern Present   Feeling of Stress : Not at all  Social Connections: Moderately Integrated   Frequency of Communication with Friends and Family: More than three times a week   Frequency of Social Gatherings with Friends and Family: Twice a week   Attends Religious Services: More than 4 times per year   Active Member of [employer] or Organizations: Yes   Attends Music therapist: More than 4 times per year   Marital Status: Never married    Current Medications:  Current Outpatient Medications:    atorvastatin (LIPITOR) 40 MG tablet, Take 1 tablet (40 mg total) by mouth daily., Disp: 90 tablet, Rfl: 1   buPROPion (WELLBUTRIN SR) 150 MG 12 hr tablet, TAKE 2 TABLETS BY MOUTH EVERY DAY (Patient taking differently: Take 150 mg by mouth 2 (two) times daily.), Disp: 180 tablet, Rfl: 1   citalopram (CELEXA) 40 MG tablet, TAKE 1 TABLET BY MOUTH EVERY DAY,  Disp: 90 tablet, Rfl: 1   clindamycin-benzoyl peroxide (BENZACLIN) gel, Apply topically 2 (two) times daily. (Patient taking differently: Apply 1 application topically 2 (two) times daily as needed (facial acne).), Disp: 50 g, Rfl: 0   clopidogrel (PLAVIX) 75 MG tablet, Take 1 tablet (75 mg total) by mouth daily., Disp: 30 tablet, Rfl: 1   Cyanocobalamin (VITAMIN B-12) 1000 MCG SUBL, Place 1,000 mcg under the tongue at bedtime., Disp: , Rfl:    levothyroxine (SYNTHROID) 75 MCG tablet, TAKE 1 TABLET(75 MCG) BY MOUTH DAILY BEFORE BREAKFAST, Disp: 90 tablet, Rfl:  1   megestrol (MEGACE) 40 MG tablet, Take 2 tablets (80 mg total) by mouth 2 (two) times daily., Disp: 120 tablet, Rfl: 1   Multiple Vitamin (MULTIVITAMIN WITH MINERALS) TABS tablet, Take 1 tablet by mouth daily., Disp: , Rfl:    polyvinyl alcohol (LIQUIFILM TEARS) 1.4 % ophthalmic solution, Place 1 drop into both eyes daily as needed for dry eyes., Disp: , Rfl:    rivaroxaban (XARELTO) 20 MG TABS tablet, Take 20 mg by mouth daily with supper., Disp: , Rfl:    Ubrogepant (UBRELVY) 100 MG TABS, Take 100 mg by mouth., Disp: , Rfl:    Vitamin D, Ergocalciferol, (DRISDOL) 1.25 MG (50000 UNIT) CAPS capsule, TAKE 1 CAPSULE BY MOUTH EVERY 7 DAYS (Patient taking differently: Take 50,000 Units by mouth every Monday. TAKE 1 CAPSULE BY MOUTH EVERY 7 DAYS), Disp: 12 capsule, Rfl: 1  Review of Systems: Denies appetite changes, fevers, chills, fatigue, unexplained weight changes. Denies hearing loss, neck lumps or masses, mouth sores, ringing in ears or voice changes. Denies cough or wheezing.  Denies shortness of breath. Denies chest pain or palpitations. Denies leg swelling. Denies abdominal distention, pain, blood in stools, constipation, diarrhea, nausea, vomiting, or early satiety. Denies pain with intercourse, dysuria, frequency, hematuria or incontinence. Denies hot flashes, pelvic pain, vaginal bleeding or vaginal discharge.   Denies joint pain,  back pain or muscle pain/cramps. Denies itching, rash, or wounds. Denies dizziness, headaches, numbness or seizures. Denies swollen lymph nodes or glands, denies easy bruising or bleeding. Denies anxiety, depression, confusion, or decreased concentration.  Physical Exam: There were no vitals taken for this visit. General: ***Alert, oriented, no acute distress. HEENT: ***Posterior oropharynx clear, sclera anicteric. Chest: ***Clear to auscultation bilaterally.  ***Port site clean. Cardiovascular: ***Regular rate and rhythm, no murmurs. Abdomen: ***Obese, soft, nontender.  Normoactive bowel sounds.  No masses or hepatosplenomegaly appreciated.  ***Well-healed scar. Extremities: ***Grossly normal range of motion.  Warm, well perfused.  No edema bilaterally. Skin: ***No rashes or lesions noted. Lymphatics: ***No cervical, supraclavicular, or inguinal adenopathy. GU: Normal appearing external genitalia without erythema, excoriation, or lesions.  Speculum exam reveals ***.  Bimanual exam reveals ***.  ***Rectovaginal exam  confirms ___.  Laboratory & Radiologic Studies: ***  Assessment & Plan: NISHTHA RAIDER is a 70 y.o. woman with Stage *** who presents for ***.  ***  *** minutes of total time was spent for this patient encounter, including preparation, face-to-face counseling with the patient and coordination of care, and documentation of the encounter.  Jeral Pinch, MD  Division of Gynecologic Oncology  Department of Obstetrics and Gynecology  [facility]

## 2020-12-04 ENCOUNTER — Ambulatory Visit: Payer: Medicare Other

## 2020-12-06 ENCOUNTER — Telehealth: Payer: Self-pay

## 2020-12-06 ENCOUNTER — Ambulatory Visit: Payer: Medicare Other | Attending: Family Medicine

## 2020-12-06 ENCOUNTER — Other Ambulatory Visit: Payer: Self-pay

## 2020-12-06 DIAGNOSIS — R2681 Unsteadiness on feet: Secondary | ICD-10-CM | POA: Insufficient documentation

## 2020-12-06 DIAGNOSIS — M6281 Muscle weakness (generalized): Secondary | ICD-10-CM | POA: Insufficient documentation

## 2020-12-06 DIAGNOSIS — R2689 Other abnormalities of gait and mobility: Secondary | ICD-10-CM | POA: Insufficient documentation

## 2020-12-06 NOTE — Telephone Encounter (Signed)
Returning call to Natalie [name]. Patient calling to follow up on her bleeding and surgery scheduling. She has been experiencing "big gushes" of vaginal bleeding. She states she has been taking 160 mg of progesterone daily. Patient reports her bleeding this week has been "regularly heavy" but has tapered off today. She reports having to wear pull ups and has to change them 3-4 times daily. Patient states she feels run down, has no stamina and tires easily. Patient was unable to stand to make a pot of coffee yesterday. When walking across a room she has to stop to catch her breath. Denies shortness of breath or difficulty breathing.  She is considering not going to PT today due to fatigue and vaginal bleeding.   Informed patient that we are working on moving her surgical date up to mid November possibly 11/15. New surgical optimization forms have been sent to PCP and Cardiologist. Patient states she has an appointment with Natalie [name] on 11/10 and an appointment with Natalie [name] on 11/15. Patient states she will reach out to them and follow up. Advised patient that our office will contact her with any updates. Patient verbalized understanding. Instructed to call for any questions or concerns.

## 2020-12-06 NOTE — Therapy (Signed)
[place] MAIN [facility] SERVICES [address] [place], [place], [address] Phone: [phone]   Fax:  [phone]  Physical Therapy Treatment/DISCHARGE SUMMARY  Patient Details  Name: Natalie [name] MRN: [id number] Date of Birth: Nov 07, 1950 Referring Provider (PT): [name], MD   Encounter Date: 12/06/2020   PT End of Session - 12/07/20 1222     Visit Number 24    Number of Visits 36    Date for PT Re-Evaluation 01/15/21    Authorization Type Traditional Medicare with [employer] Supplemental: VL based on certification    Authorization Time Period 11/20/20-01/15/21    Progress Note Due on Visit 30    PT Start Time 1441    PT Stop Time 1514    PT Time Calculation (min) 33 min    Equipment Utilized During Treatment Gait belt    Activity Tolerance Patient tolerated treatment well;Treatment limited secondary to medical complications (Comment);Patient limited by fatigue   orthostatsis   Behavior During Therapy [facility] for tasks assessed/performed             Past Medical History:  Diagnosis Date   Acute bronchospasm due to viral infection 2020   Due to pollen   Anemia    history of   CVA (cerebral vascular accident) ([place]) 02/28/2020   and 04/19/20   Depressive disorder    Epistaxis    Fatigue    Hearing loss    History of loop recorder    Hypothyroidism    Memory change    Migraine    Obesity (BMI 30.0-34.9)    lost 60 lbs   Osteoarthritis    Osteoporosis    Polyuria    Premature menopause    Seizures ([place]) 1994   history of mini seizures, possible migraine induced    Past Surgical History:  Procedure Laterality Date   ABLATION  2006   BUBBLE STUDY  04/23/2020   Procedure: BUBBLE STUDY;  Surgeon: Larey Dresser, MD;  Location: [place];  Service: Cardiovascular;;   CATARACT EXTRACTION W/ INTRAOCULAR LENS IMPLANT Bilateral    COLONOSCOPY     COLONOSCOPY WITH PROPOFOL N/A 12/22/2018   Procedure: COLONOSCOPY WITH  PROPOFOL;  Surgeon: Virgel Manifold, MD;  Location: [facility] ENDOSCOPY;  Service: Endoscopy;  Laterality: N/A;   EYE SURGERY  Spring 2018   Cataracts both eyes   HYSTEROSCOPY WITH D & C N/A 11/01/2020   Procedure: DILATATION AND CURETTAGE /HYSTEROSCOPY WITH MYOSURE;  Surgeon: Lafonda Mosses, MD;  Location: [facility];  Service: Gynecology;  Laterality: N/A;   LOOP RECORDER INSERTION N/A 04/23/2020   Procedure: LOOP RECORDER INSERTION;  Surgeon: Evans Lance, MD;  Location: [place] CV LAB;  Service: Cardiovascular;  Laterality: N/A;   TEE WITHOUT CARDIOVERSION N/A 04/23/2020   Procedure: TRANSESOPHAGEAL ECHOCARDIOGRAM (TEE);  Surgeon: Larey Dresser, MD;  Location: [facility] ENDOSCOPY;  Service: Cardiovascular;  Laterality: N/A;   TONSILLECTOMY      There were no vitals filed for this visit.   Subjective Assessment - 12/06/20 1442     Subjective Pt presents to PT in transport chair holding QC. The pt reports her activity levels have continued to be significantly limited due to excessive vaginal bleeding (pt being followed by MD for this issue) following D&C procedure. Pt also reports continued changed in dose of progesterone.  Pt reports sometimes it seems as though the bleeding will resolve but then returns again. Pt is wearing pull-ups to address this. She states, "I have no  energy," and that she also has little appetite. Pt reports her surgery has been moved up to November 15th. While pt reports general limitation in activity due to fatigue she reports she has not been stumbling.    Pertinent History Natalie [name] is a 46yoF who is referred to OPPT neuro by PCP after acut eonset changes in mentation, activity tolerance, and steadiness of gait. Pt sustained an acute Left MCA CVA on 04/19/20. PMH: CVA in Jan 2022, anxiety, hypothyroidism, depression, OA, osteoporosis, memory change, migraine, seizures, hearing loss, orthostatic hypotension. Pt seen in our clinc from April 2022-Sept 2022, then took a  break for dilation, hysteroscopy, and hysteroscopic curettage on 9/29, given clearance to return to rehab 2 weeks later.    Limitations Standing;Walking;Lifting;House hold activities;Writing    How long can you sit comfortably? not a limitations    How long can you stand comfortably? ~ 5 minutes    How long can you walk comfortably? since procedure, has tolerated all household and limited community distances thus far, has not performed longer.    Diagnostic tests per chart: "[place] 04/19/20: MR BRAIN IMPRESSION:  1. Patchy small volume acute ischemic nonhemorrhagic posterior left  MCA distribution infarcts, likely reflecting an embolic shower. No  associated hemorrhage or mass effect.  2. No other acute intracranial abnormality.  3. Underlying mild chronic microvascular ischemic disease.; 3/17 CT ANGIO HEAD NECK:  IMPRESSION:  Nearly occlusive thrombus at the bifurcation of a proximal M2 MCA  branch approximately 6 mm from the MCA bifurcation. Subsequent  occlusion of a proximal left M3 MCA branch. No stenosis in the neck."    Patient Stated Goals Pt says she wants to improve her balance and does not want to fall again    Currently in Pain? No/denies           TREATMENT  MMT of BLEs grossly 4+/5 B  Seated marches 15x, pt reports fatigue  LUE BP reading, seated: 104/60 mmHg HR 80 bpm. Pt reports this is low compared to her typical BP.  Pt performed STS to RW with close CGA x2. Pt able to tolerate 35 sec of standing before requesting rest break due to feeling light-headed/fatigue. PT assisted pt back into chair.  PT continued to monitor pt throughout session. Pt remained alert.   PT provided education regarding current discharge recommendations until pt condition stabilized and pt through her surgery and cleared by an MD to return for future therapy. PT did educate pt on inquiring about possible pelvic health PT consult following her surgery and once cleared by her MD.  PT provided pt with  handout with the above recommendations.  Pt verbalized understanding for all.    PT Education - 12/07/20 1220     Education Details Discharge recommendations due to current medical condition and approaching surgery (Nov. 15th). PT instructs pt in continuing seated HEP only as pt is able and not feeling faint prior to surgery. PT recommends perfoming seated HEP in the presence of her daughter for safety. PT also recommends returning to PT at a future date following surgery once cleared by her MD to do so.    Person(s) Educated Patient    Methods Explanation    Comprehension Verbalized understanding              PT Short Term Goals - 11/21/20 0948       PT SHORT TERM GOAL #1   Title Pt will report good compliance with HEP in order to improve strength and  balance in order to decrease fall risk and improve function at home and work.    Baseline 10/19: coached resuming performance;    Time 2    Period Weeks    Status New    Target Date 12/18/20               PT Long Term Goals - 11/21/20 0948       PT LONG TERM GOAL #1   Title Pt will improve DGI by at least 3 points in order to demonstrate clinically significant improvement in balance and decreased risk for falls.    Baseline 4/18: 18/24; 9/13: 18/24;    Time 8    Period Weeks    Status New    Target Date 01/15/21      PT LONG TERM GOAL #2   Title Pt to demonstrate performance of 6MWT >1258ft to improve AMB tolerance for safe and independent IADL performance.    Baseline 10/19: Performing only household and limited community distances;    Time 8    Period Weeks    Status New    Target Date 01/15/21      PT LONG TERM GOAL #3   Title Pt to demonstrate improved 5xSTS in less than 12sec hands free to indicate improved BLE power for improved step-response righting during LOB events.    Baseline 10/19: 14.67sec (ovious hip extension weakness after 3rd rep)    Time 8    Period Weeks    Status New    Target Date  01/15/21      PT LONG TERM GOAL #4   Title Patient will increase BLE gross strength to > or = 4+/5 as to improve functional strength for independent gait, increased standing tolerance and increased ADL ability.    Baseline 4/18: gross BLE strength currnelty 4/5 7/6: hips 4+/5 ankles 4/5 9/13: grossly 4+/5    Time 8    Period Weeks    Status New    Target Date 01/15/21      PT LONG TERM GOAL #5   Title Patient will increase FOTO score to equal to or greater than 75 to demonstrate improvement in mobility and quality of life.    Baseline 4/18: 74% 7/6: 59%; 7/21: 65 9/13: 68%; 10/18: 71    Time 8    Period Weeks    Status New    Target Date 01/15/21      PT LONG TERM GOAL #6   Title Patient will reduce dizziness handicap inventory score to <10, for less dizziness with ADLs and increased safety with home and work tasks.    Baseline 4/27: DHI score is 16, indicating mild handicap 7/6: 22% 9/13: 20%    Time 8    Period Weeks    Status On-going    Target Date 01/15/21                   Plan - 12/07/20 1223     Clinical Impression Statement PT recommends discharge on this date due to pt's ongoing increased vaginal bleeding, as pt significantly limited in activity tolerance by fatigue and feeling faint (pt being followed by MD), and pt has upcoming surgery November 15th. Pt BP assessed and was 104/60 mmHg seated. Pt did attempt standing at RW with CGAx2, but after approximatley 35 seconds requested to sit down due to feeling faint. Pt still with 4+/5 BLE strength, but currently has significantly limited activity tolerance. PT does recommend pt return for future physical therapy  once through surgery and cleared by her MD to do so. The pt is to be discharged on this date. PT assisted pt back to waiting area with pt in tranpsort chair. Other staff notified to assist pt upstairs to be picked up by her daughter.    Personal Factors and Comorbidities Age;Comorbidity 1;Comorbidity  2;Past/Current Experience;Sex;Social Background;Behavior Pattern;Comorbidity 3+    Comorbidities CVA in Jan 2022, anxiety, hypothyroidism, depression, OA, osteoporosis, memory change, migraine, seizures, hearing loss, orthostatic hypotension    Examination-Activity Limitations Bathing;Stairs;Stand;Hygiene/Grooming;Locomotion Level;Carry;Bed Mobility;Other;Reach Overhead;Lift    Examination-Participation Restrictions Community Activity;Yard Work;Shop;Cleaning;Laundry;Medication Management;Meal Prep    Stability/Clinical Decision Making Evolving/Moderate complexity    Rehab Potential Fair    PT Frequency 2x / week    PT Duration 8 weeks    PT Treatment/Interventions ADLs/Self Care Home Management;Biofeedback;Canalith Repostioning;Cryotherapy;Electrical Stimulation;Aquatic Therapy;Moist Heat;Ultrasound;DME Instruction;Gait training;Stair training;Functional mobility training;Therapeutic activities;Therapeutic exercise;Balance training;Neuromuscular re-education;Cognitive remediation;Patient/family education;Orthotic Fit/Training;Manual techniques;Passive range of motion;Energy conservation;Taping;Splinting;Vestibular;Visual/perceptual remediation/compensation;Joint Manipulations    PT Next Visit Plan Reassessment orthostatic vitals including a 3 minute standing and standing at end of walking; resume dynamic motor control balance training;    PT Home Exercise Plan Access Code: [email or web address], added:  Access Code: [email or web address], no updates    Consulted and Agree with Plan of Care Patient             Patient will benefit from skilled therapeutic intervention in order to improve the following deficits and impairments:  Abnormal gait, Decreased balance, Decreased endurance, Difficulty walking, Decreased range of motion, Dizziness, Improper body mechanics, Impaired vision/preception, Decreased coordination, Decreased strength, Decreased mobility, Obesity, Decreased activity tolerance, Decreased knowledge of use of  DME, Impaired flexibility, Postural dysfunction, Cardiopulmonary status limiting activity, Decreased knowledge of precautions  Visit Diagnosis: Muscle weakness (generalized)  Unsteadiness on feet  Other abnormalities of gait and mobility     Problem List Patient Active Problem List   Diagnosis Date Noted   Complex atypical endometrial hyperplasia 11/05/2020   Post-menopausal bleeding    Thickened endometrium 08/13/2020   Acute ITP ([place]) 05/02/2020   Other intra-abdominal and pelvic swelling, mass and lump 04/26/2020   Pelvic mass in female 04/25/2020   Ovarian cancer screening 04/25/2020   Ovarian mass, left 04/25/2020   Stroke (cerebrum) ([place]) 04/20/2020   Acute CVA (cerebrovascular accident) ([place]) 04/19/2020   CVA (cerebral vascular accident) ([place]) 04/19/2020   History of ischemic multifocal posterior circulation stroke 02/29/2020   Thrombocytopenia ([place]) 02/28/2020   HPV test positive 07/08/2015   Difficulty hearing 06/28/2015   Arthritis, degenerative 06/28/2015   Obesity, Class I, BMI 30-34.9 06/28/2015   Paresthesia 06/28/2015   Purpura, nonthrombopenic ([place]) 06/28/2015   Other specified hypothyroidism 08/09/2014   Migraine without aura and without status migrainosus, not intractable 09/19/2008   Moderate major depression ([place]) 09/07/2006   OP (osteoporosis) 09/07/2006    Zollie Pee, PT 12/07/2020, 12:36 PM  [place] MAIN [facility] SERVICES [address] [place], [place], [address] Phone: [phone]   Fax:  [phone]  Name: Natalie [name] MRN: [id number] Date of Birth: 06-24-1950

## 2020-12-07 ENCOUNTER — Encounter: Payer: Self-pay | Admitting: Family Medicine

## 2020-12-07 ENCOUNTER — Encounter: Payer: Self-pay | Admitting: Gynecologic Oncology

## 2020-12-07 NOTE — Progress Notes (Addendum)
COVID swab appointment: n/a  COVID Vaccine Completed: yes x3 Date COVID Vaccine completed: 03/15/19, 04/05/19 Has received booster: 11/28/19 COVID vaccine manufacturer: Pfizer      Date of COVID positive in last 90 days: no  PCP - [name], MD Cardiologist - Dr. Jefm [name]  Chest x-ray - n/a EKG - 08/07/20 Epic Stress Test - 03/19/20 Care ECHO - 04/23/20 Epic Cardiac Cath -  Pacemaker/ICD device last checked: 08/05/20 Epic Spinal Cord Stimulator: n/a  Sleep Study - n/a CPAP -   Fasting Blood Sugar - n/a Checks Blood Sugar _____ times a day  Blood Thinner Instructions: Plavix, Xarelto, no instructions given. Will contact cardiologist for instructions  Aspirin Instructions: Last Dose:  Activity level: Can go up a flight of stairs and perform activities of daily living without symptoms of chest pain. Difficulty with stairs due to dizziness and unsteady. Patient endorses SOB intermittent that she needs to sit down and take a break before she can walk again. Also reports being fatigued very easily.   Anesthesia review: HTN, stroke, SOB, bronchospasm, seizures, anemia, CVA, new dizziness pt is seeing PCP tomorrow, UA positive for Hgb, protein, leukocytes, RBCs and bacteria  Patient denies shortness of breath, fever, cough and chest pain at PAT appointment   Patient verbalized understanding of instructions that were given to them at the PAT appointment. Patient was also instructed that they will need to review over the PAT instructions again at home before surgery.

## 2020-12-10 ENCOUNTER — Other Ambulatory Visit: Payer: Self-pay

## 2020-12-10 ENCOUNTER — Encounter: Payer: Self-pay | Admitting: Gynecologic Oncology

## 2020-12-10 ENCOUNTER — Encounter (HOSPITAL_COMMUNITY): Payer: Self-pay

## 2020-12-10 ENCOUNTER — Encounter: Payer: Self-pay | Admitting: Internal Medicine

## 2020-12-10 ENCOUNTER — Encounter (HOSPITAL_COMMUNITY)
Admission: RE | Admit: 2020-12-10 | Discharge: 2020-12-10 | Disposition: A | Discharge status: Home / Self Care | Payer: Medicare Other | Source: Ambulatory Visit | Attending: Gynecologic Oncology | Admitting: Gynecologic Oncology

## 2020-12-10 ENCOUNTER — Inpatient Hospital Stay: Payer: Medicare Other

## 2020-12-10 ENCOUNTER — Telehealth: Payer: Self-pay

## 2020-12-10 ENCOUNTER — Inpatient Hospital Stay: Payer: Medicare Other | Attending: Hematology and Oncology | Admitting: Gynecologic Oncology

## 2020-12-10 VITALS — BP 116/80 | HR 85 | Temp 98.3°F | Resp 18 | Ht 65.0 in | Wt 167.0 lb

## 2020-12-10 VITALS — BP 110/76 | HR 82 | Temp 98.4°F | Resp 14 | Ht 65.0 in | Wt 168.4 lb

## 2020-12-10 DIAGNOSIS — Z87891 Personal history of nicotine dependence: Secondary | ICD-10-CM | POA: Diagnosis not present

## 2020-12-10 DIAGNOSIS — R82998 Other abnormal findings in urine: Secondary | ICD-10-CM | POA: Insufficient documentation

## 2020-12-10 DIAGNOSIS — N838 Other noninflammatory disorders of ovary, fallopian tube and broad ligament: Secondary | ICD-10-CM | POA: Insufficient documentation

## 2020-12-10 DIAGNOSIS — N8502 Endometrial intraepithelial neoplasia [EIN]: Secondary | ICD-10-CM | POA: Insufficient documentation

## 2020-12-10 DIAGNOSIS — Z01812 Encounter for preprocedural laboratory examination: Secondary | ICD-10-CM | POA: Diagnosis not present

## 2020-12-10 DIAGNOSIS — R531 Weakness: Secondary | ICD-10-CM | POA: Diagnosis not present

## 2020-12-10 DIAGNOSIS — R5383 Other fatigue: Secondary | ICD-10-CM | POA: Diagnosis not present

## 2020-12-10 DIAGNOSIS — Z8673 Personal history of transient ischemic attack (TIA), and cerebral infarction without residual deficits: Secondary | ICD-10-CM | POA: Insufficient documentation

## 2020-12-10 DIAGNOSIS — D696 Thrombocytopenia, unspecified: Secondary | ICD-10-CM | POA: Insufficient documentation

## 2020-12-10 DIAGNOSIS — Z7901 Long term (current) use of anticoagulants: Secondary | ICD-10-CM

## 2020-12-10 DIAGNOSIS — E039 Hypothyroidism, unspecified: Secondary | ICD-10-CM | POA: Diagnosis not present

## 2020-12-10 DIAGNOSIS — Z7902 Long term (current) use of antithrombotics/antiplatelets: Secondary | ICD-10-CM | POA: Insufficient documentation

## 2020-12-10 DIAGNOSIS — I480 Paroxysmal atrial fibrillation: Secondary | ICD-10-CM | POA: Diagnosis not present

## 2020-12-10 HISTORY — DX: Cardiac arrhythmia, unspecified: I49.9

## 2020-12-10 HISTORY — DX: Dyspnea, unspecified: R06.00

## 2020-12-10 LAB — URINALYSIS, COMPLETE (UACMP) WITH MICROSCOPIC
Bilirubin Urine: NEGATIVE
Glucose, UA: NEGATIVE mg/dL
Ketones, ur: NEGATIVE mg/dL
Nitrite: NEGATIVE
Protein, ur: 100 mg/dL — AB
RBC / HPF: >50 RBC/hpf — ABNORMAL HIGH (ref 0–5)
Specific Gravity, Urine: 1.014 (ref 1.005–1.030)
pH: 6 (ref 5.0–8.0)

## 2020-12-10 LAB — COMPREHENSIVE METABOLIC PANEL
ALT: 20 U/L (ref 0–44)
AST: 20 U/L (ref 15–41)
Albumin: 3.9 g/dL (ref 3.5–5.0)
Alkaline Phosphatase: 101 U/L (ref 38–126)
Anion gap: 6 (ref 5–15)
BUN: 25 mg/dL — ABNORMAL HIGH (ref 8–23)
CO2: 23 mmol/L (ref 22–32)
Calcium: 8.8 mg/dL — ABNORMAL LOW (ref 8.9–10.3)
Chloride: 109 mmol/L (ref 98–111)
Creatinine, Ser: 0.88 mg/dL (ref 0.44–1.00)
GFR, Estimated: >60 mL/min (ref >60)
Glucose, Bld: 99 mg/dL (ref 70–99)
Potassium: 4 mmol/L (ref 3.5–5.1)
Sodium: 138 mmol/L (ref 135–145)
Total Bilirubin: 0.6 mg/dL (ref 0.3–1.2)
Total Protein: 6.9 g/dL (ref 6.5–8.1)

## 2020-12-10 MED ORDER — TRAMADOL HCL 50 MG PO TABS
50.0000 mg | ORAL_TABLET | Freq: Four times a day (QID) | ORAL | 0 refills | Status: DC | PRN
Start: 2020-12-10 — End: 2021-01-07

## 2020-12-10 MED ORDER — SENNOSIDES-DOCUSATE SODIUM 8.6-50 MG PO TABS: 2.0000 | ORAL_TABLET | Freq: Every day | ORAL | 0 refills | Status: DC

## 2020-12-10 MED ORDER — POLYETHYLENE GLYCOL 3350 17 G PO PACK: 17.0000 g | PACK | Freq: Every day | ORAL | 0 refills | Status: DC

## 2020-12-10 NOTE — Progress Notes (Signed)
[place] DEVICE PROGRAMMING  Patient Information: Name:  GRAZIELLA CONNERY  DOB:  [birth year]  MRN:  [id number]    Planned Procedure:  total hysterectomy with bilateral salpino oophorectomy  Surgeon:  Dr. Jeral Pinch  Date of Procedure:  12/18/20  Position during surgery:  unknown   Please send documentation back to:  [facility] (Fax # [phone])  Device Information:  Clinic EP Physician:  Cristopher Peru, MD   Device Type:  Loop Recorder Manufacturer and Phone #:  [employer]: [id number] Pacemaker Dependent?:  N/A Date of Last Device Check:  08/06/2020 Normal Device Function?:  Yes  Electrophysiologist's Recommendations:  No magnet required.   Per Device Clinic Standing Orders, MACHAELA CATERINO, RN  5:00 PM 12/10/2020

## 2020-12-10 NOTE — Patient Instructions (Addendum)
DUE TO COVID-19 ONLY ONE VISITOR IS ALLOWED TO COME WITH YOU AND STAY IN THE WAITING ROOM ONLY DURING PRE OP AND PROCEDURE.   **NO VISITORS ARE ALLOWED IN THE SHORT STAY AREA OR RECOVERY ROOM!!**       Your procedure is scheduled on: 12/18/20   Report to [facility] Main Entrance    Report to admitting at 1:00 PM   Call this number if you have problems the morning of surgery [phone]   Do not eat food :After Midnight.   May have liquids until 12:15 PM day of surgery  CLEAR LIQUID DIET  Foods Allowed                                                                     Foods Excluded  Water, Black Coffee and tea (no milk or creamer)           liquids that you cannot  Plain Jell-O in any flavor  (No red)                                    see through such as: Fruit ices (not with fruit pulp)                                            milk, soups, orange juice              Iced Popsicles (No red)                                                All solid food                                   Apple juices Sports drinks like Gatorade (No red) Lightly seasoned clear broth or consume(fat free) Sugar    Oral Hygiene is also important to reduce your risk of infection.                                    Remember - BRUSH YOUR TEETH THE MORNING OF SURGERY WITH YOUR REGULAR TOOTHPASTE   Take these medicines the morning of surgery with A SIP OF WATER: Lipitor, Wellbutrin, Celexa, Synthroid.    Call your cardiologist and ask for instructions regarding Plavix and Xarelto before surgery.                              You may not have any metal on your body including hair pins, jewelry, and body piercing             Do not wear make-up, lotions, powders, perfumes, or deodorant  Do not wear nail polish including gel and S&S, artificial/acrylic nails, or any other type of covering  on natural nails including finger and toenails. If you have artificial nails, gel coating, etc. that needs  to be removed by a nail salon please have this removed prior to surgery or surgery may need to be canceled/ delayed if the surgeon/ anesthesia feels like they are unable to be safely monitored.   Do not shave  48 hours prior to surgery.    Do not bring valuables to the hospital. [place].    Patients discharged on the day of surgery will not be allowed to drive home.  Please read over the following fact sheets you were given: IF YOU HAVE QUESTIONS ABOUT YOUR PRE-OP INSTRUCTIONS PLEASE CALL [facility] - Preparing for Surgery Before surgery, you can play an important role.  Because skin is not sterile, your skin needs to be as free of germs as possible.  You can reduce the number of germs on your skin by washing with CHG (chlorahexidine gluconate) soap before surgery.  CHG is an antiseptic cleaner which kills germs and bonds with the skin to continue killing germs even after washing. Please DO NOT use if you have an allergy to CHG or antibacterial soaps.  If your skin becomes reddened/irritated stop using the CHG and inform your nurse when you arrive at Short Stay. Do not shave (including legs and underarms) for at least 48 hours prior to the first CHG shower.  You may shave your face/neck.  Please follow these instructions carefully:  1.  Shower with CHG Soap the night before surgery and the  morning of surgery.  2.  If you choose to wash your hair, wash your hair first as usual with your normal  shampoo.  3.  After you shampoo, rinse your hair and body thoroughly to remove the shampoo.                             4.  Use CHG as you would any other liquid soap.  You can apply chg directly to the skin and wash.  Gently with a scrungie or clean washcloth.  5.  Apply the CHG Soap to your body ONLY FROM THE NECK DOWN.   Do   not use on face/ open                           Wound or open sores. Avoid contact with eyes, ears  mouth and   genitals (private parts).                       Wash face,  Genitals (private parts) with your normal soap.             6.  Wash thoroughly, paying special attention to the area where your    surgery  will be performed.  7.  Thoroughly rinse your body with warm water from the neck down.  8.  DO NOT shower/wash with your normal soap after using and rinsing off the CHG Soap.                9.  Pat yourself dry with a clean towel.            10.  Wear clean pajamas.  11.  Place clean sheets on your bed the night of your first shower and do not  sleep with pets. Day of Surgery : Do not apply any lotions/deodorants the morning of surgery.  Please wear clean clothes to the hospital/surgery center.  FAILURE TO FOLLOW THESE INSTRUCTIONS MAY RESULT IN THE CANCELLATION OF YOUR SURGERY  PATIENT SIGNATURE_________________________________  NURSE SIGNATURE__________________________________  ________________________________________________________________________  WHAT IS A BLOOD TRANSFUSION? Blood Transfusion Information  A transfusion is the replacement of blood or some of its parts. Blood is made up of multiple cells which provide different functions. Red blood cells carry oxygen and are used for blood loss replacement. White blood cells fight against infection. Platelets control bleeding. Plasma helps clot blood. Other blood products are available for specialized needs, such as hemophilia or other clotting disorders. BEFORE THE TRANSFUSION  Who gives blood for transfusions?  Healthy volunteers who are fully evaluated to make sure their blood is safe. This is blood bank blood. Transfusion therapy is the safest it has ever been in the practice of medicine. Before blood is taken from a donor, a complete history is taken to make sure that person has no history of diseases nor engages in risky social behavior (examples are intravenous drug use or sexual activity with multiple  partners). The donor's travel history is screened to minimize risk of transmitting infections, such as malaria. The donated blood is tested for signs of infectious diseases, such as HIV and hepatitis. The blood is then tested to be sure it is compatible with you in order to minimize the chance of a transfusion reaction. If you or a relative donates blood, this is often done in anticipation of surgery and is not appropriate for emergency situations. It takes many days to process the donated blood. RISKS AND COMPLICATIONS Although transfusion therapy is very safe and saves many lives, the main dangers of transfusion include:  Getting an infectious disease. Developing a transfusion reaction. This is an allergic reaction to something in the blood you were given. Every precaution is taken to prevent this. The decision to have a blood transfusion has been considered carefully by your caregiver before blood is given. Blood is not given unless the benefits outweigh the risks. AFTER THE TRANSFUSION Right after receiving a blood transfusion, you will usually feel much better and more energetic. This is especially true if your red blood cells have gotten low (anemic). The transfusion raises the level of the red blood cells which carry oxygen, and this usually causes an energy increase. The nurse administering the transfusion will monitor you carefully for complications. HOME CARE INSTRUCTIONS  No special instructions are needed after a transfusion. You may find your energy is better. Speak with your caregiver about any limitations on activity for underlying diseases you may have. SEEK MEDICAL CARE IF:  Your condition is not improving after your transfusion. You develop redness or irritation at the intravenous (IV) site. SEEK IMMEDIATE MEDICAL CARE IF:  Any of the following symptoms occur over the next 12 hours: Shaking chills. You have a temperature by mouth above 102 F (38.9 C), not controlled by  medicine. Chest, back, or muscle pain. People around you feel you are not acting correctly or are confused. Shortness of breath or difficulty breathing. Dizziness and fainting. You get a rash or develop hives. You have a decrease in urine output. Your urine turns a dark color or changes to pink, red, or brown. Any of the following symptoms occur over the next 10 days: You have  a temperature by mouth above 102 F (38.9 C), not controlled by medicine. Shortness of breath. Weakness after normal activity. The white part of the eye turns yellow (jaundice). You have a decrease in the amount of urine or are urinating less often. Your urine turns a dark color or changes to pink, red, or brown. Document Released: 01/18/2000 Document Revised: 04/14/2011 Document Reviewed: 09/06/2007 [facility] Patient Information 2014 [place], [place].  _______________________________________________________________________

## 2020-12-10 NOTE — Patient Instructions (Addendum)
Preparing for your Surgery  Plan for surgery on December 18, 2020 with Dr. Jeral Pinch at [facility] will be scheduled for robotic assisted total laparoscopic hysterectomy (removal of the uterus and cervix), bilateral salpingo-oophorectomy (removal of both ovaries and fallopian tubes), sentinel lymph node biopsy.   We are waiting to hear from your PCP about whether they feel you are safe to go home the same day of surgery if doing well or if you would need an overnight stay in the hospital.  We have also reached back out to your cardiologist about the recommendations of when to stop your plavix. The NP had recommended holding your xarelto and plavix for 3 days before surgery but we want to confirm the length for the plavix. We will update you on this once we have received this.  We have sent in Miralax you can use once daily before surgery to assist with constipation. This is a white powder that mixes into any liquid. It can always be increased to twice a day if needed.  Pre-operative Testing -(DONE) You will receive a phone call from presurgical testing at [facility] to arrange for a pre-operative appointment and lab work.  -Bring your insurance card, copy of an advanced directive if applicable, medication list  -At that visit, you will be asked to sign a consent for a possible blood transfusion in case a transfusion becomes necessary during surgery.  The need for a blood transfusion is rare but having consent is a necessary part of your care.     -Do not take supplements such as fish oil (omega 3), red yeast rice, turmeric before your surgery. You want to avoid medications with aspirin in them including headache powders such as BC or Goody's), Excedrin migraine.  Day Before Surgery at [place] will be asked to take in a light diet the day before surgery. You will be advised you can have clear liquids up until 3 hours before your surgery.    Eat a light diet the  day before surgery.  Examples including soups, broths, toast, yogurt, mashed potatoes.  AVOID GAS PRODUCING FOODS. Things to avoid include carbonated beverages (fizzy beverages, sodas), raw fruits and raw vegetables (uncooked), or beans.   If your bowels are filled with gas, your surgeon will have difficulty visualizing your pelvic organs which increases your surgical risks.  Your role in recovery Your role is to become active as soon as directed by your doctor, while still giving yourself time to heal.  Rest when you feel tired. You will be asked to do the following in order to speed your recovery:  - Cough and breathe deeply. This helps to clear and expand your lungs and can prevent pneumonia after surgery.  - [place]. Do mild physical activity. Walking or moving your legs help your circulation and body functions return to normal. Do not try to get up or walk alone the first time after surgery.   -If you develop swelling on one leg or the other, pain in the back of your leg, redness/warmth in one of your legs, please call the office or go to the Emergency Room to have a doppler to rule out a blood clot. For shortness of breath, chest pain-seek care in the Emergency Room as soon as possible. - Actively manage your pain. Managing your pain lets you move in comfort. We will ask you to rate your pain on a scale of zero to 10. It  is your responsibility to tell your doctor or nurse where and how much you hurt so your pain can be treated.  Special Considerations -If you are diabetic, you may be placed on insulin after surgery to have closer control over your blood sugars to promote healing and recovery.  This does not mean that you will be discharged on insulin.  If applicable, your oral antidiabetics will be resumed when you are tolerating a solid diet.  -Your final pathology results from surgery should be available around one week after surgery and the results will be relayed to you  when available.  -Dr. Lahoma Crocker is the surgeon that assists your GYN Oncologist with surgery.  If you end up staying the night, the next day after your surgery you will either see Dr. Denman George, Dr. Berline Lopes, or Dr. Lahoma Crocker.  -FMLA forms can be faxed to [phone] and please allow 5-7 business days for completion.  Pain Management After Surgery -You have been prescribed your pain medication and bowel regimen medications before surgery so that you can have these available when you are discharged from the hospital. The pain medication is for use ONLY AFTER surgery and a new prescription will not be given.   -Make sure that you have Tylenol at home to use on a regular basis after surgery for pain control.   -Review the attached handout on narcotic use and their risks and side effects.   Bowel Regimen -You have been prescribed Sennakot-S to take nightly to prevent constipation especially if you are taking the narcotic pain medication intermittently.  It is important to prevent constipation and drink adequate amounts of liquids. You can stop taking this medication when you are not taking pain medication and you are back on your normal bowel routine.  Risks of Surgery Risks of surgery are low but include bleeding, infection, damage to surrounding structures, re-operation, blood clots, and very rarely death.   Blood Transfusion Information (For the consent to be signed before surgery)  We will be checking your blood type before surgery so in case of emergencies, we will know what type of blood you would need.                                            WHAT IS A BLOOD TRANSFUSION?  A transfusion is the replacement of blood or some of its parts. Blood is made up of multiple cells which provide different functions. Red blood cells carry oxygen and are used for blood loss replacement. White blood cells fight against infection. Platelets control bleeding. Plasma helps clot  blood. Other blood products are available for specialized needs, such as hemophilia or other clotting disorders. BEFORE THE TRANSFUSION  Who gives blood for transfusions?  You may be able to donate blood to be used at a later date on yourself (autologous donation). Relatives can be asked to donate blood. This is generally not any safer than if you have received blood from a stranger. The same precautions are taken to ensure safety when a relative's blood is donated. Healthy volunteers who are fully evaluated to make sure their blood is safe. This is blood bank blood. Transfusion therapy is the safest it has ever been in the practice of medicine. Before blood is taken from a donor, a complete history is taken to make sure that person has no history of diseases nor engages in  risky social behavior (examples are intravenous drug use or sexual activity with multiple partners). The donor's travel history is screened to minimize risk of transmitting infections, such as malaria. The donated blood is tested for signs of infectious diseases, such as HIV and hepatitis. The blood is then tested to be sure it is compatible with you in order to minimize the chance of a transfusion reaction. If you or a relative donates blood, this is often done in anticipation of surgery and is not appropriate for emergency situations. It takes many days to process the donated blood. RISKS AND COMPLICATIONS Although transfusion therapy is very safe and saves many lives, the main dangers of transfusion include:  Getting an infectious disease. Developing a transfusion reaction. This is an allergic reaction to something in the blood you were given. Every precaution is taken to prevent this. The decision to have a blood transfusion has been considered carefully by your caregiver before blood is given. Blood is not given unless the benefits outweigh the risks.  AFTER SURGERY INSTRUCTIONS  Return to work: 4-6 weeks if  applicable  Activity: 1. Be up and out of the bed during the day.  Take a nap if needed.  You may walk up steps but be careful and use the hand rail.  Stair climbing will tire you more than you think, you may need to stop part way and rest.   2. No lifting or straining for 6 weeks over 10 pounds. No pushing, pulling, straining for 6 weeks.  3. No driving for 1 week(s) if you were cleared to drive before surgery.  Do not drive if you are taking narcotic pain medicine and make sure that your reaction time has returned.   4. You can shower as soon as the next day after surgery. Shower daily.  Use your regular soap and water (not directly on the incision) and pat your incision(s) dry afterwards; don't rub.  No tub baths or submerging your body in water until cleared by your surgeon. If you have the soap that was given to you by pre-surgical testing that was used before surgery, you do not need to use it afterwards because this can irritate your incisions.   5. No sexual activity and nothing in the vagina for 8 weeks.  6. You may experience a small amount of clear drainage from your incisions, which is normal.  If the drainage persists, increases, or changes color please call the office.  7. Do not use creams, lotions, or ointments such as neosporin on your incisions after surgery until advised by your surgeon because they can cause removal of the dermabond glue on your incisions.    8. You may experience vaginal spotting after surgery or around the 6-8 week mark from surgery when the stitches at the top of the vagina begin to dissolve.  The spotting is normal but if you experience heavy bleeding, call our office.  9. Take Tylenol first for pain and only use narcotic pain medication for severe pain not relieved by the Tylenol.  Monitor your Tylenol intake to a max of 4,000 mg in a 24 hour period.   Diet: 1. Low sodium Heart Healthy Diet is recommended but you are cleared to resume your normal  (before surgery) diet after your procedure.  2. It is safe to use a laxative, such as Miralax or Colace, if you have difficulty moving your bowels. You have been prescribed Sennakot at bedtime every evening to keep bowel movements regular and to  prevent constipation.    Wound Care: 1. Keep clean and dry.  Shower daily.  Reasons to call the Doctor: Fever - Oral temperature greater than 100.4 degrees Fahrenheit Foul-smelling vaginal discharge Difficulty urinating Nausea and vomiting Increased pain at the site of the incision that is unrelieved with pain medicine. Difficulty breathing with or without chest pain New calf pain especially if only on one side Sudden, continuing increased vaginal bleeding with or without clots.   Contacts: For questions or concerns you should contact:  Dr. Jeral Pinch at [phone]  [name], NP at [phone]  After Hours: call [phone] and have the GYN Oncologist paged/contacted (after 5 pm or on the weekends).  Messages sent via mychart are for non-urgent matters and are not responded to after hours so for urgent needs, please call the after hours number.

## 2020-12-10 NOTE — Progress Notes (Addendum)
Attempted to get UA. Patient missed the hat. Called Dr. Lulu Riding office as patient is going there after PAT appointment. Spoke with Barbaraann Share. She stated they will attempt to get UA. If unsuccessful will get one DOS. Provided patient with a bottle of water.  UA obtained. Came back positive for bacteria, Hgb, protein, leukocytes, and RBCs. Results routed to  Dr. Berline Lopes.

## 2020-12-10 NOTE — Progress Notes (Signed)
Patient here for a pre-operative appointment/discussion prior to her scheduled surgery on December 18, 2020. She is scheduled for robotic assisted total laparoscopic hysterectomy, bilateral salpingo-oophorectomy, sentinel lymph node biopsy. She had her pre-admission testing appointment earlier today at [facility]. The surgery was discussed in detail.  See after visit summary for additional details. Visual aids used to discuss items related to surgery including sequential compression stockings, foley catheter, IV pump, multi-modal pain regimen including tylenol, photo of the surgical robot, female reproductive system to discuss surgery in detail.  She states her abnormal uterine bleeding has slowed some. In the recent past, it has slowed then she experienced moderate bleeding episodes with clots. This has happened around five times for her, eventually prompting evaluation in the ER. She reports weakness and fatigue with intermittent lightheadedness. She has been using a cane at home for assistance. She is also monitoring her fluid intake and protein intake.       Discussed post-op pain management in detail including the aspects of the enhanced recovery pathway.  Advised her that a new prescription would be sent in for tramadol and it is only to be used for after her upcoming surgery.  We discussed the use of tylenol post-op and to monitor for a maximum of 4,000 mg in a 24 hour period.  Also prescribed sennakot to be used after surgery and to hold if having loose stools.  Discussed bowel regimen in detail. Given her constipation currently, miralax has been sent in to begin use prior to surgery. She is advised she can continue this post-op or begin taking the sennakot-S.   Discussed the use of heparin pre-op, SCDs, and measures to take at home to prevent DVT including frequent mobility.  Reportable signs and symptoms of DVT discussed. Post-operative instructions discussed and expectations for after surgery.  Incisional care discussed as well including reportable signs and symptoms including erythema, drainage, wound separation.  We discussed the information needed from her other providers including whether an overnight stay in the hospital would be necessary given her history along with clarification on plavix and xarelto pre-operatively. For her dilation and curettage, recommendations from cardiology included holding plavix and xarelto for 5 days before surgery. On recent forms, it was stated to stop the plavix and xarelto 3 days before for her major robotic surgery. Clarification is needed on this. Advised patient we would update her on this information once it has been received.    15 minutes spent with the patient.  Verbalizing understanding of material discussed. No needs or concerns voiced at the end of the visit.   Advised patient to call for any needs.  Advised that her pre-op Miralax and post-operative medications had been prescribed and could be picked up at any time.  The pre-operative urine sample was obtained in our office and sent to lab.   This appointment is included in the global surgical bundle as pre-operative teaching and has no charge.

## 2020-12-10 NOTE — Telephone Encounter (Signed)
Spoke with receptionist [place]. She is sending a message to D. White, NP to verify the recommendations for holding Plavix and Xarelto. Current recommendations are  holding medications for  3 days prior and 2 days after Surgery on 12-18-20. Stated that Ms Linsley is having major surgery as noted on the optimization form. Recommendations received from physicians  are usually for medications to be held 5-7 days prior to surgery. [place] office phone number of [phone] for her to call back with verification.

## 2020-12-10 NOTE — Progress Notes (Signed)
Name: Natalie [name]   MRN: [id number]    DOB: April 15, 1950   Date:12/11/2020       Progress Note  Subjective  Chief Complaint  Follow Up  HPI  HPV positive/High CA 125/ovarian mass : she is under the care of Dr. Berline Lopes, biopsy was inconclusive and patient will undergo surgery on Nov 15 th. Pre-op forms here today. She is high risk due to anemia, also CVA times two earlier this year but needs to proceed with hysterectomy to control bleedign and to make sure she does not have ovarian cancer. Patient is aware of risk , I recommend keeping her at the hospital overnight.   Anemia unspecified: she went to [facility] for post-menopausal bleeding on 10/27 and found to be anemic, she has noticed sob over the past couple of week, and getting worse since that day. She is still having vaginal bleeding, started July 22 and has been constant but end of October it was very heavy, currently not as heavy   MDD: she  was under Dr. Leonette Most care for many years but he is retiring and she asked me to prescribe her medications from now on. she takes medications as prescribed. She is worried about her health, but states she is more concerned about dying during surgery than about the possibity of cancer.    B12 deficiency: she is taking B12 supplementation, unchanged    Hypothyroidism: she has been taking medication as prescribed last level at goal, continue medication    Dyslipidemia/history of CVA times two: she is now taking statin therapy, she had two strokes since January 21 and March 21. She was on Plavix and aspirin, currently on Xarelto and Plavix and off aspirin. Last LDL was at goal at 45 . She is going to have ovarian mass removed next week and will have to stop blood thinners, discussed risk of recurrence of stroke   ITP: she is seeing hematologist, she took prednisone her levelswent back to normal she has anemia now also, had labs drawn for pre op yesterday but levels not back yet.   Malnutrition: she has lost 69  lbs int he past 13  months, over 10 % of weight loss in the past year. She is trying a high protein diet .   Patient Active Problem List   Diagnosis Date Noted   Complex atypical endometrial hyperplasia 11/05/2020   Post-menopausal bleeding    Thickened endometrium 08/13/2020   Acute ITP ([place]) 05/02/2020   Other intra-abdominal and pelvic swelling, mass and lump 04/26/2020   Pelvic mass in female 04/25/2020   Ovarian cancer screening 04/25/2020   Ovarian mass, left 04/25/2020   Stroke (cerebrum) ([place]) 04/20/2020   Acute CVA (cerebrovascular accident) ([place]) 04/19/2020   CVA (cerebral vascular accident) ([place]) 04/19/2020   History of ischemic multifocal posterior circulation stroke 02/29/2020   Thrombocytopenia ([place]) 02/28/2020   HPV test positive 07/08/2015   Difficulty hearing 06/28/2015   Arthritis, degenerative 06/28/2015   Obesity, Class I, BMI 30-34.9 06/28/2015   Paresthesia 06/28/2015   Purpura, nonthrombopenic ([place]) 06/28/2015   Other specified hypothyroidism 08/09/2014   Migraine without aura and without status migrainosus, not intractable 09/19/2008   Moderate major depression ([place]) 09/07/2006   OP (osteoporosis) 09/07/2006    Past Surgical History:  Procedure Laterality Date   ABLATION  2006   BUBBLE STUDY  04/23/2020   Procedure: BUBBLE STUDY;  Surgeon: Larey Dresser, MD;  Location: [facility] ENDOSCOPY;  Service: Cardiovascular;;   CATARACT EXTRACTION W/ INTRAOCULAR LENS IMPLANT  Bilateral    COLONOSCOPY     COLONOSCOPY WITH PROPOFOL N/A 12/22/2018   Procedure: COLONOSCOPY WITH PROPOFOL;  Surgeon: Virgel Manifold, MD;  Location: [facility] ENDOSCOPY;  Service: Endoscopy;  Laterality: N/A;   EYE SURGERY  Spring 2018   Cataracts both eyes   HYSTEROSCOPY WITH D & C N/A 11/01/2020   Procedure: DILATATION AND CURETTAGE /HYSTEROSCOPY WITH MYOSURE;  Surgeon: Lafonda Mosses, MD;  Location: [facility] ORS;  Service: Gynecology;  Laterality: N/A;   LOOP RECORDER INSERTION N/A 04/23/2020    Procedure: LOOP RECORDER INSERTION;  Surgeon: Evans Lance, MD;  Location: [facility] LAB;  Service: Cardiovascular;  Laterality: N/A;   TEE WITHOUT CARDIOVERSION N/A 04/23/2020   Procedure: TRANSESOPHAGEAL ECHOCARDIOGRAM (TEE);  Surgeon: Larey Dresser, MD;  Location: [facility] ENDOSCOPY;  Service: Cardiovascular;  Laterality: N/A;   TONSILLECTOMY      Family History  Problem Relation Age of Onset   Early death Father        suicide   Depression Father    Diabetes Father    Cancer Father        prostate   Hearing loss Father        due to war   Alzheimer's disease Mother    Heart disease Brother    Atrial fibrillation Brother    Heart disease Maternal Aunt    Dementia Maternal Aunt    Heart disease Maternal Uncle    Dementia Maternal Grandmother    Heart disease Brother    Atrial fibrillation Brother    Colon cancer Neg Hx    Breast cancer Neg Hx    Ovarian cancer Neg Hx    Pancreatic cancer Neg Hx    Endometrial cancer Neg Hx     Social History   Tobacco Use   Smoking status: Former    Packs/day: 0.50    Years: 6.00    Pack years: 3.00    Types: Cigarettes    Quit date: 02/11/1974    Years since quitting: 46.8   Smokeless tobacco: Never  Substance Use Topics   Alcohol use: Yes    Comment: rare     Current Outpatient Medications:    atorvastatin (LIPITOR) 40 MG tablet, Take 1 tablet (40 mg total) by mouth daily., Disp: 90 tablet, Rfl: 1   buPROPion (WELLBUTRIN SR) 150 MG 12 hr tablet, TAKE 2 TABLETS BY MOUTH EVERY DAY (Patient taking differently: Take 150 mg by mouth 2 (two) times daily.), Disp: 180 tablet, Rfl: 1   citalopram (CELEXA) 40 MG tablet, TAKE 1 TABLET BY MOUTH EVERY DAY, Disp: 90 tablet, Rfl: 1   clindamycin-benzoyl peroxide (BENZACLIN) gel, Apply topically 2 (two) times daily. (Patient taking differently: Apply 1 application topically 2 (two) times daily as needed (facial acne).), Disp: 50 g, Rfl: 0   clopidogrel (PLAVIX) 75 MG tablet, Take 1  tablet (75 mg total) by mouth daily., Disp: 30 tablet, Rfl: 1   Cyanocobalamin (VITAMIN B-12) 1000 MCG SUBL, Place 1,000 mcg under the tongue at bedtime., Disp: , Rfl:    levothyroxine (SYNTHROID) 75 MCG tablet, TAKE 1 TABLET(75 MCG) BY MOUTH DAILY BEFORE BREAKFAST, Disp: 90 tablet, Rfl: 1   megestrol (MEGACE) 40 MG tablet, Take 2 tablets (80 mg total) by mouth 2 (two) times daily., Disp: 120 tablet, Rfl: 1   Multiple Vitamin (MULTIVITAMIN WITH MINERALS) TABS tablet, Take 1 tablet by mouth daily., Disp: , Rfl:    polyethylene glycol (MIRALAX / GLYCOLAX) 17 g packet, Take 17 g  by mouth daily. Can use before surgery for constipation, Disp: 14 each, Rfl: 0   polyvinyl alcohol (LIQUIFILM TEARS) 1.4 % ophthalmic solution, Place 1 drop into both eyes daily as needed for dry eyes., Disp: , Rfl:    rivaroxaban (XARELTO) 20 MG TABS tablet, Take 20 mg by mouth daily with supper., Disp: , Rfl:    senna-docusate (SENOKOT-S) 8.6-50 MG tablet, Take 2 tablets by mouth at bedtime. For AFTER surgery, do not take if having diarrhea, Disp: 30 tablet, Rfl: 0   traMADol (ULTRAM) 50 MG tablet, Take 1 tablet (50 mg total) by mouth every 6 (six) hours as needed for severe pain. For AFTER surgery only, do not take and drive, Disp: 10 tablet, Rfl: 0   Ubrogepant (UBRELVY) 100 MG TABS, Take 100 mg by mouth daily as needed (migraines)., Disp: , Rfl:    Vitamin D, Ergocalciferol, (DRISDOL) 1.25 MG (50000 UNIT) CAPS capsule, TAKE 1 CAPSULE BY MOUTH EVERY 7 DAYS (Patient taking differently: Take 50,000 Units by mouth every Monday. TAKE 1 CAPSULE BY MOUTH EVERY 7 DAYS), Disp: 12 capsule, Rfl: 1  Allergies  Allergen Reactions   Sulfamethoxazole-Trimethoprim Hives    I personally reviewed active problem list, medication list, allergies, family history, social history, health maintenance with the patient/caregiver today.   ROS  Constitutional: Negative for fever, positive for  weight change - loss.  Respiratory: Negative for  cough , positive for shortness of breath.   Cardiovascular: Negative for chest pain or palpitations.  Gastrointestinal: Negative for abdominal pain, no bowel changes.  Musculoskeletal: Negative for gait problem or joint swelling.  Skin: Negative for rash.  Neurological: Negative for dizziness or headache.  No other specific complaints in a complete review of systems (except as listed in HPI above).   Objective  Vitals:   12/11/20 1140  BP: 120/80  Pulse: 95  Resp: 16  Temp: 98.1 F (36.7 C)  SpO2: 97%  Weight: 167 lb (75.8 kg)  Height: 5\' 5"  (1.651 m)    Body mass index is 27.79 kg/m.  Physical Exam  Constitutional: Patient appears well-developed and malnourished, with temporal waisting  No distress.  HEENT: head atraumatic, normocephalic, pupils equal and reactive to light,, neck supple Cardiovascular: Normal rate, regular rhythm and normal heart sounds.  No murmur heard. No BLE edema. Pulmonary/Chest: Effort normal and breath sounds normal. No respiratory distress. Abdominal: Soft.  There is no tenderness. Psychiatric: Patient has a normal mood and affect. behavior is normal. Judgment and thought content normal.   Recent Results (from the past 2160 hour(s))  Type and screen     Status: None   Collection Time: 10/23/20 10:54 AM  Result Value Ref Range   ABO/RH(D) A POS    Antibody Screen NEG    Sample Expiration 11/04/2020,2359    Extend sample reason      NO TRANSFUSIONS OR PREGNANCY IN THE PAST 3 MONTHS Performed at [facility], [place] [address] [place], [place] [address]   CBC     Status: None   Collection Time: 10/23/20 10:54 AM  Result Value Ref Range   WBC 5.6 4.0 - 10.5 K/uL   RBC 3.99 3.87 - 5.11 MIL/uL   Hemoglobin 12.4 12.0 - 15.0 g/dL   HCT 38.8 36.0 - 46.0 %   MCV 97.2 80.0 - 100.0 fL   MCH 31.1 26.0 - 34.0 pg   MCHC 32.0 30.0 - 36.0 g/dL   RDW 12.0 11.5 - 15.5 %   Platelets 197 150 -  400 K/uL   nRBC 0.0 0.0 - 0.2 %    Comment:  Performed at [facility], [place] [address], [place], [place] [address]  Comprehensive metabolic panel     Status: Abnormal   Collection Time: 10/23/20 10:54 AM  Result Value Ref Range   Sodium 145 135 - 145 mmol/L   Potassium 4.5 3.5 - 5.1 mmol/L   Chloride 111 98 - 111 mmol/L   CO2 26 22 - 32 mmol/L   Glucose, Bld 104 (H) 70 - 99 mg/dL    Comment: Glucose reference range applies only to samples taken after fasting for at least 8 hours.   BUN 38 (H) 8 - 23 mg/dL   Creatinine, Ser 0.79 0.44 - 1.00 mg/dL   Calcium 9.3 8.9 - 10.3 mg/dL   Total Protein 6.9 6.5 - 8.1 g/dL   Albumin 3.9 3.5 - 5.0 g/dL   AST 20 15 - 41 U/L   ALT 21 0 - 44 U/L   Alkaline Phosphatase 98 38 - 126 U/L   Total Bilirubin 0.5 0.3 - 1.2 mg/dL   GFR, Estimated >60 >60 mL/min    Comment: (NOTE) Calculated using the CKD-EPI Creatinine Equation (2021)    Anion gap 8 5 - 15    Comment: Performed at [facility], [place] [address], [place], [place] [address]  Surgical pathology     Status: None   Collection Time: 11/01/20  7:06 AM  Result Value Ref Range   SURGICAL PATHOLOGY      SURGICAL PATHOLOGY CASE: [id number] PATIENT: Hassan Rowan Surgical Pathology Report     Clinical History: Post menopausal bleeding, thickened endometrium (crm)     FINAL MICROSCOPIC DIAGNOSIS:  A. ENDOMETRIUM, CURETTAGE: - Atypical hyperplasia, see comment      COMMENT:  Focal well differentiated adenocarcinoma cannot be entirely ruled out. Dr. Melina Copa reviewed the case and concurs with the diagnosis.     GROSS DESCRIPTION:  Specimen is received fresh in a mesh bag, and consists of a 2.6 x 2.1 x 0.3 cm aggregate of tan-pink soft tissue fragments and clotted blood. The specimen is entirely submitted in 1 cassette.  Craig Staggers 11/01/2020)    Final Diagnosis performed by Jaquita Folds, MD.   Electronically signed 11/02/2020 Technical and / or Professional components performed at  [facility], [place] [address] [place], [place] [address].  Immunohistochemistry Technical component (if applicable) was performed at [facility] [address], [place], [place],  [address].   IMMUNOHISTOCHEMISTRY DISCLAIMER (if applicable): Some of these immunohistochemical stains may have been developed and the performance characteristics determine by [facility]. Some may not have been cleared or approved by the U.S. Food and Drug Administration. The FDA has determined that such clearance or approval is not necessary. This test is used for clinical purposes. It should not be regarded as investigational or for research. This laboratory is certified under the [place] (CLIA-88) as qualified to perform high complexity clinical laboratory testing.  The controls stained appropriately.   CBC     Status: None   Collection Time: 11/16/20  3:55 PM  Result Value Ref Range   WBC 7.4 4.0 - 10.5 K/uL   RBC 3.95 3.87 - 5.11 MIL/uL   Hemoglobin 12.4 12.0 - 15.0 g/dL   HCT 37.4 36.0 - 46.0 %   MCV 94.7 80.0 - 100.0 fL   MCH 31.4 26.0 - 34.0 pg   MCHC 33.2 30.0 -  36.0 g/dL   RDW 11.9 11.5 - 15.5 %   Platelets 197 150 - 400 K/uL   nRBC 0.0 0.0 - 0.2 %    Comment: Performed at [facility], [place] [address] [place], [place] [address]  Type and screen [place]     Status: None   Collection Time: 11/29/20  6:06 PM  Result Value Ref Range   ABO/RH(D) A POS    Antibody Screen NEG    Sample Expiration      12/02/2020,2359 Performed at [facility], [place] [place], [place] [address]   CBC     Status: Abnormal   Collection Time: 11/29/20  6:16 PM  Result Value Ref Range   WBC 6.3 4.0 - 10.5 K/uL   RBC 3.22 (L) 3.87 - 5.11 MIL/uL   Hemoglobin 10.5 (L) 12.0 - 15.0 g/dL   HCT 30.8 (L) 36.0 - 46.0 %   MCV 95.7 80.0 - 100.0 fL   MCH 32.6 26.0 - 34.0  pg   MCHC 34.1 30.0 - 36.0 g/dL   RDW 12.7 11.5 - 15.5 %   Platelets 268 150 - 400 K/uL   nRBC 0.0 0.0 - 0.2 %    Comment: Performed at [facility], [address], [place], [place] [address]  Basic metabolic panel     Status: Abnormal   Collection Time: 11/29/20  6:16 PM  Result Value Ref Range   Sodium 138 135 - 145 mmol/L   Potassium 3.8 3.5 - 5.1 mmol/L   Chloride 109 98 - 111 mmol/L   CO2 22 22 - 32 mmol/L   Glucose, Bld 110 (H) 70 - 99 mg/dL    Comment: Glucose reference range applies only to samples taken after fasting for at least 8 hours.   BUN 19 8 - 23 mg/dL   Creatinine, Ser 0.83 0.44 - 1.00 mg/dL   Calcium 8.8 (L) 8.9 - 10.3 mg/dL   GFR, Estimated >60 >60 mL/min    Comment: (NOTE) Calculated using the CKD-EPI Creatinine Equation (2021)    Anion gap 7 5 - 15    Comment: Performed at [facility], [place]., [place], [place] [address]  APTT     Status: None   Collection Time: 11/29/20  6:17 PM  Result Value Ref Range   aPTT 30 24 - 36 seconds    Comment: Performed at [facility], [place] [place], [place] [address]  Type and screen     Status: None   Collection Time: 12/10/20  2:47 PM  Result Value Ref Range   ABO/RH(D) A POS    Antibody Screen NEG    Sample Expiration 12/24/2020,2359    Extend sample reason      NO TRANSFUSIONS OR PREGNANCY IN THE PAST 3 MONTHS Performed at [place] [address], [place], [place] [address]   Comprehensive metabolic panel     Status: Abnormal   Collection Time: 12/10/20  2:47 PM  Result Value Ref Range   Sodium 138 135 - 145 mmol/L   Potassium 4.0 3.5 - 5.1 mmol/L   Chloride 109 98 - 111 mmol/L   CO2 23 22 - 32 mmol/L   Glucose, Bld 99 70 - 99 mg/dL    Comment: Glucose reference range applies only to samples taken after fasting for at least 8 hours.   BUN 25 (H) 8 - 23 mg/dL   Creatinine, Ser 0.88 0.44 - 1.00 mg/dL  Calcium 8.8 (L) 8.9 - 10.3 mg/dL    Total Protein 6.9 6.5 - 8.1 g/dL   Albumin 3.9 3.5 - 5.0 g/dL   AST 20 15 - 41 U/L   ALT 20 0 - 44 U/L   Alkaline Phosphatase 101 38 - 126 U/L   Total Bilirubin 0.6 0.3 - 1.2 mg/dL   GFR, Estimated >60 >60 mL/min    Comment: (NOTE) Calculated using the CKD-EPI Creatinine Equation (2021)    Anion gap 6 5 - 15    Comment: Performed at [facility], [place] [address], [place], [place] [address]  Urinalysis, Complete w Microscopic     Status: Abnormal   Collection Time: 12/10/20  4:22 PM  Result Value Ref Range   Color, Urine AMBER (A) YELLOW    Comment: BIOCHEMICALS MAY BE AFFECTED BY COLOR   APPearance CLOUDY (A) CLEAR   Specific Gravity, Urine 1.014 1.005 - 1.030   pH 6.0 5.0 - 8.0   Glucose, UA NEGATIVE NEGATIVE mg/dL   Hgb urine dipstick MODERATE (A) NEGATIVE   Bilirubin Urine NEGATIVE NEGATIVE   Ketones, ur NEGATIVE NEGATIVE mg/dL   Protein, ur 100 (A) NEGATIVE mg/dL   Nitrite NEGATIVE NEGATIVE   Leukocytes,Ua TRACE (A) NEGATIVE   RBC / HPF >50 (H) 0 - 5 RBC/hpf   WBC, UA 21-50 0 - 5 WBC/hpf   Bacteria, UA FEW (A) NONE SEEN    Comment: Performed at [facility], [place] [address] [place], [place] [address]     PHQ2/9: Depression screen [facility] 2/9 12/11/2020 08/16/2020 05/11/2020 03/08/2020 01/25/2020  Decreased Interest 0 0 0 0 0  Down, Depressed, Hopeless 0 0 0 0 0  PHQ - 2 Score 0 0 0 0 0  Altered sleeping 1 0 0 1 0  Tired, decreased energy 1 0 1 1 1   Change in appetite 0 0 0 0 1  Feeling bad or failure about yourself  0 0 0 0 0  Trouble concentrating 0 0 1 0 0  Moving slowly or fidgety/restless 0 0 0 0 0  Suicidal thoughts 0 0 0 0 0  PHQ-9 Score 2 0 2 2 2   Difficult doing work/chores - - - - -  Some recent data might be hidden    phq 9 is positive   Fall Risk: Fall Risk  12/11/2020 08/16/2020 05/11/2020 03/08/2020 01/25/2020  Falls in the past year? 1 1 0 0 1  Number falls in past yr: 1 0 0 0 0  Injury with Fall? 0 1 0 0 0  Comment - -  - - -  Risk for fall due to : Impaired balance/gait - - - -  Risk for fall due to: Comment - - - - -  Follow up Falls prevention discussed - - - -      Functional Status Survey: Is the patient deaf or have difficulty hearing?: Yes Does the patient have difficulty seeing, even when wearing glasses/contacts?: No Does the patient have difficulty concentrating, remembering, or making decisions?: Yes Does the patient have difficulty walking or climbing stairs?: Yes Does the patient have difficulty dressing or bathing?: No Does the patient have difficulty doing errands alone such as visiting a doctor's office or shopping?: No    Assessment & Plan  1. Ovarian mass  Having surgery next week   2. Major depression, recurrent, chronic ([place])  She has enough medication until Dec  3. Idiopathic thrombocytopenic purpura (ITP) (H[facility])  Under the care of hematologist  4. Mild protein-calorie malnutrition ([place])  She is eating a high protein diet, but also discussed calories in general   5. Adult hypothyroidism  Last TSH at goal   6. History of CVA in adulthood  On Xarelto and will stop 5 days before procedure  7. B12 deficiency   8. Dyslipidemia   9. Vitamin D deficiency  Continue supplementation   10. Post-menopausal bleeding  Having hysterectomy next week

## 2020-12-11 ENCOUNTER — Encounter: Payer: Self-pay | Admitting: Family Medicine

## 2020-12-11 ENCOUNTER — Ambulatory Visit (INDEPENDENT_AMBULATORY_CARE_PROVIDER_SITE_OTHER): Payer: Medicare Other | Admitting: Family Medicine

## 2020-12-11 ENCOUNTER — Other Ambulatory Visit: Payer: Self-pay | Admitting: Gynecologic Oncology

## 2020-12-11 ENCOUNTER — Other Ambulatory Visit: Payer: Self-pay

## 2020-12-11 ENCOUNTER — Ambulatory Visit: Payer: Medicare Other | Admitting: Physical Therapy

## 2020-12-11 VITALS — BP 120/80 | HR 95 | Temp 98.1°F | Resp 16 | Ht 65.0 in | Wt 167.0 lb

## 2020-12-11 DIAGNOSIS — N838 Other noninflammatory disorders of ovary, fallopian tube and broad ligament: Secondary | ICD-10-CM

## 2020-12-11 DIAGNOSIS — I639 Cerebral infarction, unspecified: Secondary | ICD-10-CM

## 2020-12-11 DIAGNOSIS — D693 Immune thrombocytopenic purpura: Secondary | ICD-10-CM

## 2020-12-11 DIAGNOSIS — Z8673 Personal history of transient ischemic attack (TIA), and cerebral infarction without residual deficits: Secondary | ICD-10-CM | POA: Diagnosis not present

## 2020-12-11 DIAGNOSIS — F339 Major depressive disorder, recurrent, unspecified: Secondary | ICD-10-CM | POA: Diagnosis not present

## 2020-12-11 DIAGNOSIS — E785 Hyperlipidemia, unspecified: Secondary | ICD-10-CM | POA: Diagnosis not present

## 2020-12-11 DIAGNOSIS — E441 Mild protein-calorie malnutrition: Secondary | ICD-10-CM

## 2020-12-11 DIAGNOSIS — E039 Hypothyroidism, unspecified: Secondary | ICD-10-CM

## 2020-12-11 DIAGNOSIS — N950 Postmenopausal bleeding: Secondary | ICD-10-CM

## 2020-12-11 DIAGNOSIS — E559 Vitamin D deficiency, unspecified: Secondary | ICD-10-CM

## 2020-12-11 DIAGNOSIS — E538 Deficiency of other specified B group vitamins: Secondary | ICD-10-CM | POA: Diagnosis not present

## 2020-12-11 DIAGNOSIS — R8290 Unspecified abnormal findings in urine: Secondary | ICD-10-CM

## 2020-12-11 NOTE — Telephone Encounter (Signed)
Received the recommendations for the patient's Plavix and Xarelto. Fax recommendations to pre surgical testing

## 2020-12-11 NOTE — Anesthesia Preprocedure Evaluation (Addendum)
Anesthesia Evaluation  Patient identified by MRN, date of birth, ID band Patient awake    Reviewed: Allergy & Precautions, NPO status , Patient's Chart, lab work & pertinent test results  Airway Mallampati: II  TM Distance: >3 FB Neck ROM: Full    Dental no notable dental hx. (+) Dental Advisory Given   Pulmonary shortness of breath, former smoker,    Pulmonary exam normal breath sounds clear to auscultation       Cardiovascular Normal cardiovascular exam+ dysrhythmias  Rhythm:Regular Rate:Normal     Neuro/Psych  Headaches, Seizures -,  PSYCHIATRIC DISORDERS Depression CVA    GI/Hepatic negative GI ROS, Neg liver ROS,   Endo/Other  Hypothyroidism   Renal/GU negative Renal ROS     Musculoskeletal  (+) Arthritis ,   Abdominal   Peds  Hematology  (+) Blood dyscrasia, anemia ,   Anesthesia Other Findings   Reproductive/Obstetrics                                                            Anesthesia Evaluation  Patient identified by MRN, date of birth, ID band Patient awake    Reviewed: Allergy & Precautions, NPO status , Patient's Chart, lab work & pertinent test results  Airway Mallampati: II  TM Distance: >3 FB Neck ROM: Full    Dental no notable dental hx.    Pulmonary neg pulmonary ROS, former smoker,    Pulmonary exam normal breath sounds clear to auscultation       Cardiovascular negative cardio ROS Normal cardiovascular exam Rhythm:Regular Rate:Normal     Neuro/Psych CVA negative psych ROS   GI/Hepatic negative GI ROS, Neg liver ROS,   Endo/Other  negative endocrine ROS  Renal/GU negative Renal ROS  negative genitourinary   Musculoskeletal negative musculoskeletal ROS (+)   Abdominal   Peds negative pediatric ROS (+)  Hematology negative hematology ROS (+)   Anesthesia Other Findings   Reproductive/Obstetrics negative OB ROS                              Anesthesia Physical Anesthesia Plan  ASA: 3  Anesthesia Plan: General   Post-op Pain Management:    Induction: Intravenous  PONV Risk Score and Plan: 3 and Ondansetron, Dexamethasone and Treatment may vary due to age or medical condition  Airway Management Planned: LMA  Additional Equipment:   Intra-op Plan:   Post-operative Plan: Extubation in OR  Informed Consent: I have reviewed the patients History and Physical, chart, labs and discussed the procedure including the risks, benefits and alternatives for the proposed anesthesia with the patient or authorized representative who has indicated his/her understanding and acceptance.     Dental advisory given  Plan Discussed with: CRNA and Surgeon  Anesthesia Plan Comments:         Anesthesia Quick Evaluation  Anesthesia Physical Anesthesia Plan  ASA: 3  Anesthesia Plan: General   Post-op Pain Management:    Induction: Intravenous  PONV Risk Score and Plan: 4 or greater and Ondansetron, Dexamethasone, Treatment may vary due to age or medical condition, Midazolam and Diphenhydramine  Airway Management Planned: Oral ETT  Additional Equipment:   Intra-op Plan:   Post-operative Plan: Extubation in OR  Informed Consent: I have reviewed the patients  History and Physical, chart, labs and discussed the procedure including the risks, benefits and alternatives for the proposed anesthesia with the patient or authorized representative who has indicated his/her understanding and acceptance.     Dental advisory given  Plan Discussed with: CRNA  Anesthesia Plan Comments: (2 x PIV  Discussed risks of CVA in detail. Also, discussed COVID + status and perio risks. Dr. Berline Lopes feels both do not outweigh need for surgery today. Patient accepts risks as well. Apparently had a "head cold" about 2 weeks ago. Faint residual cough.   See PAT note 12/10/2020, Konrad Felix  Ward, PA-C)       Anesthesia Quick Evaluation

## 2020-12-11 NOTE — Progress Notes (Signed)
Anesthesia Chart Review   Case: [id number] Date/Time: 12/18/20 1500   Procedures:      XI ROBOTIC ASSISTED TOTAL HYSTERECTOMY WITH BILATERAL SALPINGO OOPHORECTOMY (Bilateral)     SENTINEL NODE BIOPSY   Anesthesia type: General   Pre-op diagnosis: AT LEAST COMPLEX ENDOMETRIAL ATYPICAL HYPERPLASIA   Location: [facility] ROOM 03 / [facility]   Surgeons: Lafonda Mosses, MD       DISCUSSION:70 y.o. former smoker with h/o hypothyroidism, CVA, PAF (on Xarelto), endometrial atypical hyperplasia scheduled for above procedure 12/18/2020 with Dr. Jeral Pinch.   Pt has loop recorder in place.   Pt last seen by cardiology 06/21/2020, stable at this visit.   Per clearance pt can hold Plavix and Xarelto 5 days prior to procedure. Pt is considered medium risk, optimized for surgery.   S/p D&C 11/01/2020 with no anesthesia complications noted.   Anticipate pt can proceed with planned procedure barring acute status change.   VS: BP 110/76   Pulse 82   Temp 36.9 C (Oral)   Resp 14   Ht 5\' 5"  (1.651 m)   Wt 76.4 kg   SpO2 100%   BMI 28.02 kg/m   PROVIDERS: [name], MD is PCP   Lujean Amel, MD is Cardiologist  LABS: Labs reviewed: Acceptable for surgery. (all labs ordered are listed, but only abnormal results are displayed)  Labs Reviewed  COMPREHENSIVE METABOLIC PANEL - Abnormal; Notable for the following components:      Result Value   BUN 25 (*)    Calcium 8.8 (*)    All other components within normal limits  TYPE AND SCREEN     IMAGES:   EKG: 08/07/2020 Rate 65 bpm  NSR LAFB Moderate voltage criteria for LVH, may be normal variant Septal infarct, age undetermined  CV: Echo 04/23/2020 1. Left ventricular ejection fraction, by estimation, is 55 to 60%. The  left ventricle has normal function. The left ventricle has no regional  wall motion abnormalities.   2. Right ventricular systolic function is normal. The right ventricular  size is normal.   3. Left atrial  size was moderately dilated. No left atrial/left atrial  appendage thrombus was detected.   4. The mitral valve is normal in structure. Trivial mitral valve  regurgitation. No evidence of mitral stenosis.   5. The aortic valve is tricuspid. Aortic valve regurgitation is trivial.  No aortic stenosis is present.   6. Normal caliber thoracic aorta with minimal plaque.   7. No PFO or ASD by color doppler or bubble study.  Past Medical History:  Diagnosis Date   Acute bronchospasm due to viral infection 2020   Due to pollen   Anemia    history of   CVA (cerebral vascular accident) ([place]) 02/28/2020   and 04/19/20   Depressive disorder    Dyspnea    Dysrhythmia    Epistaxis    Fatigue    Hearing loss    History of loop recorder    Hypothyroidism    Memory change    Migraine    Obesity (BMI 30.0-34.9)    lost 60 lbs   Osteoarthritis    Osteoporosis    Polyuria    Premature menopause    Seizures ([place]) 1994   history of mini seizures, possible migraine induced    Past Surgical History:  Procedure Laterality Date   ABLATION  2006   BUBBLE STUDY  04/23/2020   Procedure: BUBBLE STUDY;  Surgeon: Larey Dresser, MD;  Location: [facility]  ENDOSCOPY;  Service: Cardiovascular;;   CATARACT EXTRACTION W/ INTRAOCULAR LENS IMPLANT Bilateral    COLONOSCOPY     COLONOSCOPY WITH PROPOFOL N/A 12/22/2018   Procedure: COLONOSCOPY WITH PROPOFOL;  Surgeon: Virgel Manifold, MD;  Location: [facility] ENDOSCOPY;  Service: Endoscopy;  Laterality: N/A;   EYE SURGERY  Spring 2018   Cataracts both eyes   HYSTEROSCOPY WITH D & C N/A 11/01/2020   Procedure: DILATATION AND CURETTAGE /HYSTEROSCOPY WITH MYOSURE;  Surgeon: Lafonda Mosses, MD;  Location: [facility];  Service: Gynecology;  Laterality: N/A;   LOOP RECORDER INSERTION N/A 04/23/2020   Procedure: LOOP RECORDER INSERTION;  Surgeon: Evans Lance, MD;  Location: [place] [facility];  Service: Cardiovascular;  Laterality: N/A;   TEE WITHOUT CARDIOVERSION N/A  04/23/2020   Procedure: TRANSESOPHAGEAL ECHOCARDIOGRAM (TEE);  Surgeon: Larey Dresser, MD;  Location: [facility] ENDOSCOPY;  Service: Cardiovascular;  Laterality: N/A;   TONSILLECTOMY      MEDICATIONS:  atorvastatin (LIPITOR) 40 MG tablet   buPROPion (WELLBUTRIN SR) 150 MG 12 hr tablet   citalopram (CELEXA) 40 MG tablet   clindamycin-benzoyl peroxide (BENZACLIN) gel   clopidogrel (PLAVIX) 75 MG tablet   Cyanocobalamin (VITAMIN B-12) 1000 MCG SUBL   levothyroxine (SYNTHROID) 75 MCG tablet   megestrol (MEGACE) 40 MG tablet   Multiple Vitamin (MULTIVITAMIN WITH MINERALS) TABS tablet   polyethylene glycol (MIRALAX / GLYCOLAX) 17 g packet   polyvinyl alcohol (LIQUIFILM TEARS) 1.4 % ophthalmic solution   rivaroxaban (XARELTO) 20 MG TABS tablet   senna-docusate (SENOKOT-S) 8.6-50 MG tablet   traMADol (ULTRAM) 50 MG tablet   Ubrogepant (UBRELVY) 100 MG TABS   Vitamin D, Ergocalciferol, (DRISDOL) 1.25 MG (50000 UNIT) CAPS capsule   No current facility-administered medications for this encounter.    Konrad Felix Ward, PA-C [facility] [phone]

## 2020-12-11 NOTE — Progress Notes (Signed)
Urine culture added on based on UA results from 12/10/2020. The patient reported being asymptomatic from a urinary standpoint. Will rule out UTI prior to surgery.

## 2020-12-12 ENCOUNTER — Telehealth: Payer: Self-pay

## 2020-12-12 NOTE — Telephone Encounter (Signed)
Reviewed peri-operative medication guidance from cardiology with Natalie [name]. Per Dr. Clayborn Bigness patient is to hold Plavix and Xarelto 5 days prior to surgery and resume 2 days after surgery. The last dose of Plavix and Xarelto will be today 12/12/20 and she will resume taking it on 12/20/20. Patient verbalized understanding.  Patient is aware that Dr. Ancil Boozer is recommending that she stay overnight after her surgery. Instructed patient to call with any questions or concerns.

## 2020-12-13 ENCOUNTER — Ambulatory Visit: Payer: Medicare Other

## 2020-12-13 ENCOUNTER — Encounter: Payer: Self-pay | Admitting: Gynecologic Oncology

## 2020-12-13 DIAGNOSIS — Z8673 Personal history of transient ischemic attack (TIA), and cerebral infarction without residual deficits: Secondary | ICD-10-CM | POA: Diagnosis not present

## 2020-12-14 ENCOUNTER — Encounter: Payer: Self-pay | Admitting: *Deleted

## 2020-12-14 ENCOUNTER — Telehealth: Payer: Self-pay | Admitting: *Deleted

## 2020-12-14 DIAGNOSIS — Z8673 Personal history of transient ischemic attack (TIA), and cerebral infarction without residual deficits: Secondary | ICD-10-CM | POA: Diagnosis not present

## 2020-12-14 LAB — URINE CULTURE

## 2020-12-14 NOTE — Telephone Encounter (Signed)
Received neurology clearance and fax to presurgical testing

## 2020-12-17 ENCOUNTER — Telehealth: Payer: Self-pay

## 2020-12-17 DIAGNOSIS — I6340 Cerebral infarction due to embolism of unspecified cerebral artery: Secondary | ICD-10-CM | POA: Diagnosis not present

## 2020-12-17 NOTE — Telephone Encounter (Signed)
Spoke with Natalie [name] this morning. Patient is doing well. Reviewed Urine culture results, culture suggests contamination during collection. Patient denies urinary symptoms such as urgency, frequency, burning, odor or pain.  Patient compliant with pre-operative instructions.  Reinforced no solids after midnight and clear liquids until 12:15pm. No questions or concerns voiced.  Instructed to call for any needs.

## 2020-12-18 ENCOUNTER — Other Ambulatory Visit: Payer: Self-pay

## 2020-12-18 ENCOUNTER — Ambulatory Visit (HOSPITAL_COMMUNITY): Payer: Medicare Other | Admitting: Anesthesiology

## 2020-12-18 ENCOUNTER — Ambulatory Visit (HOSPITAL_COMMUNITY): Payer: Medicare Other | Admitting: Physician Assistant

## 2020-12-18 ENCOUNTER — Encounter (HOSPITAL_COMMUNITY)
Admission: RE | Disposition: A | Discharge status: Home / Self Care | Payer: Self-pay | Source: Home / Self Care | Attending: Gynecologic Oncology

## 2020-12-18 ENCOUNTER — Ambulatory Visit: Payer: Medicare Other | Admitting: Family Medicine

## 2020-12-18 ENCOUNTER — Inpatient Hospital Stay (HOSPITAL_COMMUNITY)
Admission: RE | Admit: 2020-12-18 | Discharge: 2020-12-20 | DRG: 802 | Disposition: A | Discharge status: Home / Self Care | Payer: Medicare Other | Attending: Gynecologic Oncology | Admitting: Gynecologic Oncology

## 2020-12-18 ENCOUNTER — Encounter (HOSPITAL_COMMUNITY): Payer: Self-pay | Admitting: Gynecologic Oncology

## 2020-12-18 ENCOUNTER — Ambulatory Visit: Payer: Medicare Other | Admitting: Physical Therapy

## 2020-12-18 DIAGNOSIS — D259 Leiomyoma of uterus, unspecified: Secondary | ICD-10-CM | POA: Diagnosis not present

## 2020-12-18 DIAGNOSIS — Z8249 Family history of ischemic heart disease and other diseases of the circulatory system: Secondary | ICD-10-CM | POA: Diagnosis not present

## 2020-12-18 DIAGNOSIS — N8003 Adenomyosis of the uterus: Secondary | ICD-10-CM | POA: Diagnosis not present

## 2020-12-18 DIAGNOSIS — Z820 Family history of epilepsy and other diseases of the nervous system: Secondary | ICD-10-CM

## 2020-12-18 DIAGNOSIS — Z7901 Long term (current) use of anticoagulants: Secondary | ICD-10-CM

## 2020-12-18 DIAGNOSIS — K660 Peritoneal adhesions (postprocedural) (postinfection): Secondary | ICD-10-CM | POA: Diagnosis not present

## 2020-12-18 DIAGNOSIS — N8502 Endometrial intraepithelial neoplasia [EIN]: Secondary | ICD-10-CM | POA: Diagnosis present

## 2020-12-18 DIAGNOSIS — Z8673 Personal history of transient ischemic attack (TIA), and cerebral infarction without residual deficits: Secondary | ICD-10-CM | POA: Diagnosis not present

## 2020-12-18 DIAGNOSIS — Z833 Family history of diabetes mellitus: Secondary | ICD-10-CM

## 2020-12-18 DIAGNOSIS — C541 Malignant neoplasm of endometrium: Secondary | ICD-10-CM

## 2020-12-18 DIAGNOSIS — Z8616 Personal history of COVID-19: Secondary | ICD-10-CM

## 2020-12-18 DIAGNOSIS — D649 Anemia, unspecified: Secondary | ICD-10-CM

## 2020-12-18 DIAGNOSIS — E038 Other specified hypothyroidism: Secondary | ICD-10-CM | POA: Diagnosis not present

## 2020-12-18 DIAGNOSIS — E039 Hypothyroidism, unspecified: Secondary | ICD-10-CM | POA: Diagnosis present

## 2020-12-18 DIAGNOSIS — F32A Depression, unspecified: Secondary | ICD-10-CM | POA: Diagnosis not present

## 2020-12-18 DIAGNOSIS — D251 Intramural leiomyoma of uterus: Secondary | ICD-10-CM | POA: Diagnosis not present

## 2020-12-18 DIAGNOSIS — G43909 Migraine, unspecified, not intractable, without status migrainosus: Secondary | ICD-10-CM | POA: Diagnosis present

## 2020-12-18 DIAGNOSIS — N838 Other noninflammatory disorders of ovary, fallopian tube and broad ligament: Secondary | ICD-10-CM

## 2020-12-18 DIAGNOSIS — U071 COVID-19: Secondary | ICD-10-CM | POA: Diagnosis not present

## 2020-12-18 DIAGNOSIS — I63412 Cerebral infarction due to embolism of left middle cerebral artery: Secondary | ICD-10-CM

## 2020-12-18 DIAGNOSIS — D62 Acute posthemorrhagic anemia: Principal | ICD-10-CM | POA: Diagnosis present

## 2020-12-18 DIAGNOSIS — M810 Age-related osteoporosis without current pathological fracture: Secondary | ICD-10-CM | POA: Diagnosis present

## 2020-12-18 DIAGNOSIS — Z01818 Encounter for other preprocedural examination: Secondary | ICD-10-CM

## 2020-12-18 DIAGNOSIS — N736 Female pelvic peritoneal adhesions (postinfective): Secondary | ICD-10-CM | POA: Diagnosis not present

## 2020-12-18 DIAGNOSIS — D500 Iron deficiency anemia secondary to blood loss (chronic): Secondary | ICD-10-CM

## 2020-12-18 HISTORY — DX: Malignant neoplasm of endometrium: C54.1

## 2020-12-18 HISTORY — PX: SENTINEL NODE BIOPSY: SHX6608

## 2020-12-18 HISTORY — PX: ROBOTIC ASSISTED TOTAL HYSTERECTOMY WITH BILATERAL SALPINGO OOPHERECTOMY: SHX6086

## 2020-12-18 HISTORY — DX: Personal history of COVID-19: Z86.16

## 2020-12-18 HISTORY — PX: LAPAROTOMY: SHX154

## 2020-12-18 LAB — CBC
HCT: 31.6 % — ABNORMAL LOW (ref 36.0–46.0)
Hemoglobin: 10 g/dL — ABNORMAL LOW (ref 12.0–15.0)
MCH: 30.3 pg (ref 26.0–34.0)
MCHC: 31.6 g/dL (ref 30.0–36.0)
MCV: 95.8 fL (ref 80.0–100.0)
Platelets: 410 10*3/uL — ABNORMAL HIGH (ref 150–400)
RBC: 3.3 MIL/uL — ABNORMAL LOW (ref 3.87–5.11)
RDW: 12.5 % (ref 11.5–15.5)
WBC: 6.8 10*3/uL (ref 4.0–10.5)
nRBC: 0 % (ref 0.0–0.2)

## 2020-12-18 LAB — PROTIME-INR
INR: 1.1 (ref 0.8–1.2)
Prothrombin Time: 14 seconds (ref 11.4–15.2)

## 2020-12-18 LAB — APTT: aPTT: 27 seconds (ref 24–36)

## 2020-12-18 LAB — SARS CORONAVIRUS 2 BY RT PCR (HOSPITAL ORDER, PERFORMED IN ~~LOC~~ HOSPITAL LAB): SARS Coronavirus 2: POSITIVE — AB

## 2020-12-18 SURGERY — HYSTERECTOMY, TOTAL, ROBOT-ASSISTED, LAPAROSCOPIC, WITH BILATERAL SALPINGO-OOPHORECTOMY
Anesthesia: General | Site: Abdomen

## 2020-12-18 MED ORDER — BUPIVACAINE HCL 0.25 % IJ SOLN: INTRAMUSCULAR | Status: DC | PRN

## 2020-12-18 MED ORDER — LIDOCAINE HCL (PF) 2 % IJ SOLN
INTRAMUSCULAR | Status: AC
  Filled 2020-12-18: qty 5

## 2020-12-18 MED ORDER — SENNOSIDES-DOCUSATE SODIUM 8.6-50 MG PO TABS: 2.0000 | ORAL_TABLET | Freq: Every day | ORAL | Status: DC

## 2020-12-18 MED ORDER — IBUPROFEN 400 MG PO TABS: 600.0000 mg | ORAL_TABLET | Freq: Four times a day (QID) | ORAL | Status: DC | PRN

## 2020-12-18 MED ORDER — ORAL CARE MOUTH RINSE: 15.0000 mL | Freq: Once | OROMUCOSAL | Status: AC

## 2020-12-18 MED ORDER — LACTATED RINGERS IR SOLN
Status: DC | PRN
  Administered 2020-12-18: 1000 mL

## 2020-12-18 MED ORDER — TRAMADOL HCL 50 MG PO TABS: 100.0000 mg | ORAL_TABLET | Freq: Two times a day (BID) | ORAL | Status: DC | PRN

## 2020-12-18 MED ORDER — HYDROMORPHONE HCL 1 MG/ML IJ SOLN: 0.5000 mg | INTRAMUSCULAR | Status: DC | PRN

## 2020-12-18 MED ORDER — OXYCODONE HCL 5 MG PO TABS
5.0000 mg | ORAL_TABLET | ORAL | Status: DC | PRN
  Administered 2020-12-19 – 2020-12-20 (×2): 5 mg via ORAL
  Filled 2020-12-18 (×2): qty 1

## 2020-12-18 MED ORDER — BUPIVACAINE HCL 0.25 % IJ SOLN
INTRAMUSCULAR | Status: AC
  Filled 2020-12-18: qty 1

## 2020-12-18 MED ORDER — PHENYLEPHRINE HCL-NACL 20-0.9 MG/250ML-% IV SOLN
INTRAVENOUS | Status: DC | PRN
  Administered 2020-12-18: 50 ug/min via INTRAVENOUS

## 2020-12-18 MED ORDER — STERILE WATER FOR IRRIGATION IR SOLN
Status: DC | PRN
  Administered 2020-12-18: 1000 mL

## 2020-12-18 MED ORDER — ATORVASTATIN CALCIUM 40 MG PO TABS
40.0000 mg | ORAL_TABLET | Freq: Every day | ORAL | Status: DC
  Administered 2020-12-19 – 2020-12-20 (×2): 40 mg via ORAL
  Filled 2020-12-18 (×2): qty 1

## 2020-12-18 MED ORDER — PROPOFOL 10 MG/ML IV BOLUS
INTRAVENOUS | Status: DC | PRN
  Administered 2020-12-18: 150 mg via INTRAVENOUS

## 2020-12-18 MED ORDER — SUGAMMADEX SODIUM 200 MG/2ML IV SOLN
INTRAVENOUS | Status: DC | PRN
  Administered 2020-12-18: 200 mg via INTRAVENOUS

## 2020-12-18 MED ORDER — ROCURONIUM BROMIDE 10 MG/ML (PF) SYRINGE
PREFILLED_SYRINGE | INTRAVENOUS | Status: AC
  Filled 2020-12-18: qty 10

## 2020-12-18 MED ORDER — LIDOCAINE HCL (PF) 2 % IJ SOLN
INTRAMUSCULAR | Status: DC | PRN
  Administered 2020-12-18: 60 mg

## 2020-12-18 MED ORDER — HEMOSTATIC AGENTS (NO CHARGE) OPTIME
TOPICAL | Status: DC | PRN
  Administered 2020-12-18: 1 via TOPICAL

## 2020-12-18 MED ORDER — CEFAZOLIN SODIUM-DEXTROSE 2-4 GM/100ML-% IV SOLN
2.0000 g | INTRAVENOUS | Status: AC
  Administered 2020-12-18: 2 g via INTRAVENOUS
  Filled 2020-12-18: qty 100

## 2020-12-18 MED ORDER — ONDANSETRON HCL 4 MG/2ML IJ SOLN: 4.0000 mg | Freq: Four times a day (QID) | INTRAMUSCULAR | Status: DC | PRN

## 2020-12-18 MED ORDER — DIPHENHYDRAMINE HCL 50 MG/ML IJ SOLN
INTRAMUSCULAR | Status: DC | PRN
  Administered 2020-12-18: 12.5 mg via INTRAVENOUS

## 2020-12-18 MED ORDER — ONDANSETRON HCL 4 MG/2ML IJ SOLN
INTRAMUSCULAR | Status: DC | PRN
  Administered 2020-12-18: 4 mg via INTRAVENOUS

## 2020-12-18 MED ORDER — EPHEDRINE SULFATE-NACL 50-0.9 MG/10ML-% IV SOSY
PREFILLED_SYRINGE | INTRAVENOUS | Status: DC | PRN
  Administered 2020-12-18: 5 mg via INTRAVENOUS
  Administered 2020-12-18 (×2): 10 mg via INTRAVENOUS
  Administered 2020-12-18 (×2): 5 mg via INTRAVENOUS

## 2020-12-18 MED ORDER — ONDANSETRON HCL 4 MG/2ML IJ SOLN
INTRAMUSCULAR | Status: AC
  Filled 2020-12-18: qty 2

## 2020-12-18 MED ORDER — BUPROPION HCL ER (SR) 150 MG PO TB12: 300.0000 mg | ORAL_TABLET | Freq: Every day | ORAL | Status: DC

## 2020-12-18 MED ORDER — FENTANYL CITRATE (PF) 100 MCG/2ML IJ SOLN
INTRAMUSCULAR | Status: DC | PRN
  Administered 2020-12-18 (×3): 50 ug via INTRAVENOUS

## 2020-12-18 MED ORDER — BUPIVACAINE LIPOSOME 1.3 % IJ SUSP
INTRAMUSCULAR | Status: AC
  Filled 2020-12-18: qty 20

## 2020-12-18 MED ORDER — SODIUM CHLORIDE 0.9 % IV SOLN
INTRAVENOUS | Status: DC | PRN
  Administered 2020-12-18: 40 mL

## 2020-12-18 MED ORDER — PHENYLEPHRINE HCL (PRESSORS) 10 MG/ML IV SOLN
INTRAVENOUS | Status: AC
  Filled 2020-12-18: qty 2

## 2020-12-18 MED ORDER — KCL IN DEXTROSE-NACL 20-5-0.45 MEQ/L-%-% IV SOLN
INTRAVENOUS | Status: DC
  Filled 2020-12-18 (×3): qty 1000

## 2020-12-18 MED ORDER — SODIUM CHLORIDE (PF) 0.9 % IJ SOLN
INTRAMUSCULAR | Status: AC
  Filled 2020-12-18: qty 20

## 2020-12-18 MED ORDER — HEPARIN SODIUM (PORCINE) 5000 UNIT/ML IJ SOLN
5000.0000 [IU] | INTRAMUSCULAR | Status: AC
  Administered 2020-12-18: 5000 [IU] via SUBCUTANEOUS
  Filled 2020-12-18: qty 1

## 2020-12-18 MED ORDER — ACETAMINOPHEN 500 MG PO TABS
1000.0000 mg | ORAL_TABLET | ORAL | Status: AC
  Administered 2020-12-18: 1000 mg via ORAL
  Filled 2020-12-18: qty 2

## 2020-12-18 MED ORDER — PHENYLEPHRINE 40 MCG/ML (10ML) SYRINGE FOR IV PUSH (FOR BLOOD PRESSURE SUPPORT)
PREFILLED_SYRINGE | INTRAVENOUS | Status: DC | PRN
  Administered 2020-12-18: 160 ug via INTRAVENOUS
  Administered 2020-12-18 (×2): 120 ug via INTRAVENOUS

## 2020-12-18 MED ORDER — FENTANYL CITRATE (PF) 100 MCG/2ML IJ SOLN
INTRAMUSCULAR | Status: AC
  Filled 2020-12-18: qty 2

## 2020-12-18 MED ORDER — EPHEDRINE 5 MG/ML INJ
INTRAVENOUS | Status: AC
  Filled 2020-12-18: qty 20

## 2020-12-18 MED ORDER — DEXAMETHASONE SODIUM PHOSPHATE 10 MG/ML IJ SOLN
INTRAMUSCULAR | Status: DC | PRN
  Administered 2020-12-18: 4 mg via INTRAVENOUS

## 2020-12-18 MED ORDER — STERILE WATER FOR INJECTION IJ SOLN
INTRAMUSCULAR | Status: DC | PRN
  Administered 2020-12-18: 10 mL

## 2020-12-18 MED ORDER — ACETAMINOPHEN 500 MG PO TABS
1000.0000 mg | ORAL_TABLET | Freq: Two times a day (BID) | ORAL | Status: DC
  Administered 2020-12-19: 1000 mg via ORAL
  Filled 2020-12-18: qty 2

## 2020-12-18 MED ORDER — KETAMINE HCL 10 MG/ML IJ SOLN
INTRAMUSCULAR | Status: DC | PRN
  Administered 2020-12-18: 40 mg via INTRAVENOUS

## 2020-12-18 MED ORDER — PHENYLEPHRINE 40 MCG/ML (10ML) SYRINGE FOR IV PUSH (FOR BLOOD PRESSURE SUPPORT)
PREFILLED_SYRINGE | INTRAVENOUS | Status: AC
  Filled 2020-12-18: qty 50

## 2020-12-18 MED ORDER — LEVOTHYROXINE SODIUM 75 MCG PO TABS
75.0000 ug | ORAL_TABLET | Freq: Every day | ORAL | Status: DC
  Administered 2020-12-19 – 2020-12-20 (×2): 75 ug via ORAL
  Filled 2020-12-18 (×2): qty 1

## 2020-12-18 MED ORDER — LACTATED RINGERS IV SOLN: INTRAVENOUS | Status: DC

## 2020-12-18 MED ORDER — DEXAMETHASONE SODIUM PHOSPHATE 4 MG/ML IJ SOLN: 4.0000 mg | INTRAMUSCULAR | Status: DC

## 2020-12-18 MED ORDER — MEPERIDINE HCL 50 MG/ML IJ SOLN: 6.2500 mg | INTRAMUSCULAR | Status: DC | PRN

## 2020-12-18 MED ORDER — PROPOFOL 10 MG/ML IV BOLUS
INTRAVENOUS | Status: AC
  Filled 2020-12-18: qty 20

## 2020-12-18 MED ORDER — ONDANSETRON HCL 4 MG PO TABS: 4.0000 mg | ORAL_TABLET | Freq: Four times a day (QID) | ORAL | Status: DC | PRN

## 2020-12-18 MED ORDER — ROCURONIUM BROMIDE 10 MG/ML (PF) SYRINGE
PREFILLED_SYRINGE | INTRAVENOUS | Status: DC | PRN
Start: 2020-12-18 — End: 2020-12-18
  Administered 2020-12-18: 20 mg via INTRAVENOUS
  Administered 2020-12-18: 50 mg via INTRAVENOUS

## 2020-12-18 MED ORDER — HYDROMORPHONE HCL 1 MG/ML IJ SOLN: 0.2500 mg | INTRAMUSCULAR | Status: DC | PRN

## 2020-12-18 MED ORDER — DEXAMETHASONE SODIUM PHOSPHATE 10 MG/ML IJ SOLN
INTRAMUSCULAR | Status: AC
  Filled 2020-12-18: qty 1

## 2020-12-18 MED ORDER — STERILE WATER FOR INJECTION IJ SOLN
INTRAMUSCULAR | Status: AC
  Filled 2020-12-18: qty 10

## 2020-12-18 MED ORDER — DROPERIDOL 2.5 MG/ML IJ SOLN: 0.6250 mg | Freq: Once | INTRAMUSCULAR | Status: DC | PRN

## 2020-12-18 MED ORDER — CITALOPRAM HYDROBROMIDE 20 MG PO TABS
40.0000 mg | ORAL_TABLET | Freq: Every day | ORAL | Status: DC
  Administered 2020-12-19 – 2020-12-20 (×2): 40 mg via ORAL
  Filled 2020-12-18 (×2): qty 2

## 2020-12-18 MED ORDER — SENNOSIDES-DOCUSATE SODIUM 8.6-50 MG PO TABS
2.0000 | ORAL_TABLET | Freq: Every day | ORAL | Status: DC
  Administered 2020-12-18 – 2020-12-19 (×2): 2 via ORAL
  Filled 2020-12-18 (×2): qty 2

## 2020-12-18 MED ORDER — CHLORHEXIDINE GLUCONATE 0.12 % MT SOLN
15.0000 mL | Freq: Once | OROMUCOSAL | Status: AC
  Administered 2020-12-18: 15 mL via OROMUCOSAL

## 2020-12-18 SURGICAL SUPPLY — 84 items
ADH SKN CLS APL DERMABOND .7 (GAUZE/BANDAGES/DRESSINGS) ×3
AGENT HMST KT MTR STRL THRMB (HEMOSTASIS)
APL ESCP 34 STRL LF DISP (HEMOSTASIS)
APL SRG 38 LTWT LNG FL B (MISCELLANEOUS) ×3
APPLICATOR ARISTA FLEXITIP XL (MISCELLANEOUS) ×2 IMPLANT
APPLICATOR SURGIFLO ENDO (HEMOSTASIS) IMPLANT
BAG COUNTER SPONGE SURGICOUNT (BAG) ×1 IMPLANT
BAG LAPAROSCOPIC 12 15 PORT 16 (BASKET) IMPLANT
BAG RETRIEVAL 12/15 (BASKET) ×4
BAG RETRIEVAL 12/15MM (BASKET) ×1
BAG SPEC RTRVL LRG 6X4 10 (ENDOMECHANICALS)
BAG SPNG CNTER NS LX DISP (BAG) ×3
BAG SURGICOUNT SPONGE COUNTING (BAG) ×1
BLADE SURG SZ10 CARB STEEL (BLADE) IMPLANT
COUNTER NEEDLE 20 DBL MAG RED (NEEDLE) ×2 IMPLANT
COVER BACK TABLE 60X90IN (DRAPES) ×5 IMPLANT
COVER TIP SHEARS 8 DVNC (MISCELLANEOUS) ×3 IMPLANT
COVER TIP SHEARS 8MM DA VINCI (MISCELLANEOUS) ×5
DECANTER SPIKE VIAL GLASS SM (MISCELLANEOUS) IMPLANT
DERMABOND ADVANCED (GAUZE/BANDAGES/DRESSINGS) ×2
DERMABOND ADVANCED .7 DNX12 (GAUZE/BANDAGES/DRESSINGS) ×3 IMPLANT
DRAPE ARM DVNC X/XI (DISPOSABLE) ×12 IMPLANT
DRAPE COLUMN DVNC XI (DISPOSABLE) ×3 IMPLANT
DRAPE DA VINCI XI ARM (DISPOSABLE) ×20
DRAPE DA VINCI XI COLUMN (DISPOSABLE) ×5
DRAPE SHEET LG 3/4 BI-LAMINATE (DRAPES) ×5 IMPLANT
DRAPE SURG IRRIG POUCH 19X23 (DRAPES) ×5 IMPLANT
DRSG OPSITE POSTOP 4X6 (GAUZE/BANDAGES/DRESSINGS) ×2 IMPLANT
DRSG OPSITE POSTOP 4X8 (GAUZE/BANDAGES/DRESSINGS) IMPLANT
ELECT PENCIL ROCKER SW 15FT (MISCELLANEOUS) ×2 IMPLANT
ELECT REM PT RETURN 15FT ADLT (MISCELLANEOUS) ×5 IMPLANT
GAUZE 4X4 16PLY ~~LOC~~+RFID DBL (SPONGE) ×5 IMPLANT
GLOVE SURG ENC MOIS LTX SZ6 (GLOVE) ×20 IMPLANT
GLOVE SURG ENC MOIS LTX SZ6.5 (GLOVE) ×10 IMPLANT
GOWN STRL REUS W/ TWL LRG LVL3 (GOWN DISPOSABLE) ×12 IMPLANT
GOWN STRL REUS W/TWL LRG LVL3 (GOWN DISPOSABLE) ×20
HEMOSTAT ARISTA ABSORB 3G PWDR (HEMOSTASIS) ×2 IMPLANT
HOLDER FOLEY CATH W/STRAP (MISCELLANEOUS) ×5 IMPLANT
IRRIG SUCT STRYKERFLOW 2 WTIP (MISCELLANEOUS) ×5
IRRIGATION SUCT STRKRFLW 2 WTP (MISCELLANEOUS) ×3 IMPLANT
KIT PROCEDURE DA VINCI SI (MISCELLANEOUS)
KIT PROCEDURE DVNC SI (MISCELLANEOUS) IMPLANT
KIT TURNOVER KIT A (KITS) ×2 IMPLANT
MANIPULATOR ADVINCU DEL 3.0 PL (MISCELLANEOUS) IMPLANT
MANIPULATOR UTERINE 4.5 ZUMI (MISCELLANEOUS) ×5 IMPLANT
NDL HYPO 21X1.5 SAFETY (NEEDLE) ×3 IMPLANT
NDL SPNL 18GX3.5 QUINCKE PK (NEEDLE) IMPLANT
NEEDLE HYPO 21X1.5 SAFETY (NEEDLE) ×5 IMPLANT
NEEDLE SPNL 18GX3.5 QUINCKE PK (NEEDLE) ×5 IMPLANT
OBTURATOR OPTICAL STANDARD 8MM (TROCAR) ×5
OBTURATOR OPTICAL STND 8 DVNC (TROCAR) ×3
OBTURATOR OPTICALSTD 8 DVNC (TROCAR) ×3 IMPLANT
PACK ROBOT GYN CUSTOM WL (TRAY / TRAY PROCEDURE) ×5 IMPLANT
PACKING VAGINAL (PACKING) ×2 IMPLANT
PAD POSITIONING PINK XL (MISCELLANEOUS) ×5 IMPLANT
PORT ACCESS TROCAR AIRSEAL 12 (TROCAR) ×3 IMPLANT
PORT ACCESS TROCAR AIRSEAL 5M (TROCAR) ×2
POUCH SPECIMEN RETRIEVAL 10MM (ENDOMECHANICALS) IMPLANT
SCRUB EXIDINE 4% CHG 4OZ (MISCELLANEOUS) ×5 IMPLANT
SEAL CANN UNIV 5-8 DVNC XI (MISCELLANEOUS) ×12 IMPLANT
SEAL XI 5MM-8MM UNIVERSAL (MISCELLANEOUS) ×20
SET TRI-LUMEN FLTR TB AIRSEAL (TUBING) ×5 IMPLANT
SPONGE T-LAP 18X18 ~~LOC~~+RFID (SPONGE) ×4 IMPLANT
SPONGE T-LAP 4X18 ~~LOC~~+RFID (SPONGE) ×2 IMPLANT
SURGIFLO W/THROMBIN 8M KIT (HEMOSTASIS) IMPLANT
SUT MNCRL AB 4-0 PS2 18 (SUTURE) ×4 IMPLANT
SUT PDS AB 1 TP1 96 (SUTURE) ×4 IMPLANT
SUT VIC AB 0 CT1 27 (SUTURE)
SUT VIC AB 0 CT1 27XBRD ANTBC (SUTURE) IMPLANT
SUT VIC AB 2-0 CT1 27 (SUTURE) ×10
SUT VIC AB 2-0 CT1 27XBRD (SUTURE) IMPLANT
SUT VIC AB 2-0 CT1 TAPERPNT 27 (SUTURE) IMPLANT
SUT VIC AB 2-0 SH 27 (SUTURE) ×10
SUT VIC AB 2-0 SH 27X BRD (SUTURE) IMPLANT
SUT VIC AB 4-0 PS2 18 (SUTURE) ×10 IMPLANT
SYR 10ML LL (SYRINGE) ×2 IMPLANT
TOWEL OR NON WOVEN STRL DISP B (DISPOSABLE) ×5 IMPLANT
TRAP SPECIMEN MUCUS 40CC (MISCELLANEOUS) IMPLANT
TRAY FOLEY MTR SLVR 14FR STAT (SET/KITS/TRAYS/PACK) ×2 IMPLANT
TRAY FOLEY MTR SLVR 16FR STAT (SET/KITS/TRAYS/PACK) ×3 IMPLANT
TROCAR XCEL NON-BLD 5MMX100MML (ENDOMECHANICALS) IMPLANT
UNDERPAD 30X36 HEAVY ABSORB (UNDERPADS AND DIAPERS) ×5 IMPLANT
WATER STERILE IRR 1000ML POUR (IV SOLUTION) ×5 IMPLANT
YANKAUER SUCT BULB TIP 10FT TU (MISCELLANEOUS) ×2 IMPLANT

## 2020-12-18 NOTE — Transfer of Care (Signed)
Immediate Anesthesia Transfer of Care Note  Patient: Natalie [name]  Procedure(s) Performed: XI ROBOTIC ASSISTED TOTAL HYSTERECTOMY WITH BILATERAL SALPINGO OOPHORECTOMY (Bilateral: Abdomen) SENTINEL NODE BIOPSY (Abdomen) LAPAROTOMY (Abdomen)  Patient Location: PACU  Anesthesia Type:General  Level of Consciousness: awake, alert  and oriented  Airway & Oxygen Therapy: Patient Spontanous Breathing and Patient connected to face mask  Post-op Assessment: Report given to RN and Post -op Vital signs reviewed and stable  Post vital signs: Reviewed and stable  Last Vitals:  Vitals Value Taken Time  BP 99/51 12/18/20 1806  Temp    Pulse 87 12/18/20 1807  Resp 14 12/18/20 1807  SpO2 100 % 12/18/20 1807  Vitals shown include unvalidated device data.  Last Pain:  Vitals:   12/18/20 1223  TempSrc:   PainSc: 0-No pain         Complications: No notable events documented.

## 2020-12-18 NOTE — Op Note (Signed)
OPERATIVE NOTE  Pre-operative Diagnosis: At least CAH adnexal mass  Post-operative Diagnosis: same, complex left adnexal mass (with cystic component, second component with old blood vs necrotic material)  Operation: Robotic-assisted laparoscopic total hysterectomy with bilateral salpingoophorectomy, SLN injection and SLN biopsy on the left, mini-lap for specimen removal, repair of vaginal lacerations  Surgeon: Jeral Pinch MD  Assistant Surgeon: Lahoma Crocker MD (an MD assistant was necessary for tissue manipulation, management of robotic instrumentation, retraction and positioning due to the complexity of the case and hospital policies).   Anesthesia: GET  Urine Output: 100cc  Operative Findings: On EUA, large globular uterus. On intra-abdominal entry, normal upper abdominal survey.Normal omentum, small and large bowel. Uterus 10cm, very globular and bulbous at the fundus. Multiple fibroids including 3cm lower uterine segment anterior fibroid and posterior 6cm fundal fibroid. Right adnexa normal and atrophic. Mapping to channels along the right broad ligament, no obvious SLN. On the left, mapping successful to external iliac SLN. Left ovary replaced by 6cm cyst adherent to the broad ligament and sigmoid mesentery with old blood versus necrotic tissue within (cyst wall very easily ripped open with minimal manipulation of the adnexa. Second 2cm cystic component. No obvious findings of malignancy but would be stage II if cancer given adherence to surrounding pelvic structures. Some adhesions between the bladder and cervix. No obvious adenopathy. No ascites. Mini-lap required for specimen removal given size.  No frozen sent as it was not going to impact management. Goal was to keep the surgery under or close to 2 hours.  Estimated Blood Loss:  300cc      Total IV Fluids: see I&O flowsheet         Specimens: uterus, cervix, bilateral tubes and ovaries, left sentinel lymph node          Complications:  None apparent; patient tolerated the procedure well.         Disposition: PACU - hemodynamically stable.  Procedure Details  The patient was seen in the Holding Room. The risks, benefits, complications, treatment options, and expected outcomes were discussed with the patient.  The patient concurred with the proposed plan, giving informed consent.  The site of surgery properly noted/marked. The patient was identified as Natalie [name] and the procedure verified as a Robotic-assisted hysterectomy with bilateral salpingo oophorectomy with SLN biopsy.   After induction of anesthesia, the patient was draped and prepped in the usual sterile manner. Patient was placed in supine position after anesthesia and draped and prepped in the usual sterile manner as follows: Her arms were tucked to her side with all appropriate precautions.  The shoulders were stabilized with padded shoulder blocks applied to the acromium processes.  The patient was placed in the semi-lithotomy position in [place].  The perineum and vagina were prepped with CholoraPrep. The patient was draped after the CholoraPrep had been allowed to dry for 3 minutes.  A Time Out was held and the above information confirmed.  The urethra was prepped with Betadine. Foley catheter was placed.  A sterile speculum was placed in the vagina.  The cervix was grasped with a single-tooth tenaculum. 2mg  total of ICG was injected into the cervical stroma at 2 and 9 o'clock with 1cc injected at a 1cm and 62mm depth (concentration 0.5mg /ml) in all locations. The cervix was dilated with Kennon Rounds dilators.  The ZUMI uterine manipulator with a small colpotomizer ring was placed without difficulty.  A pneum occluder balloon was placed over the manipulator.  OG tube placement was  confirmed and to suction.   Next, a 10 mm skin incision was made 1 cm below the subcostal margin in the midclavicular line.  The 5 mm Optiview port and scope was used for  direct entry.  Opening pressure was under 10 mm CO2.  The abdomen was insufflated and the findings were noted as above.   At this point and all points during the procedure, the patient's intra-abdominal pressure did not exceed 15 mmHg. Next, an 8 mm skin incision was made superior to the umbilicus and a right and left port were placed about 8 cm lateral to the robot port on the right and left side.  A fourth arm was placed on the right.  The 5 mm assist trocar was exchanged for a 10-12 mm port. All ports were placed under direct visualization.  The patient was placed in steep Trendelenburg.  Bowel was folded away into the upper abdomen.  The robot was docked in the normal manner.  The right and left peritoneum were opened parallel to the IP ligament to open the retroperitoneal spaces bilaterally. The round ligaments were transected. The SLN mapping was performed in bilateral pelvic basins. After identifying the ureters, the para rectal and paravesical spaces were opened up entirely with careful dissection below the level of the ureters bilaterally and to the depth of the uterine artery origin in order to skeletonize the uterine "web" and ensure visualization of all parametrial channels. The para-aortic basins were carefully exposed and evaluated for isolated para-aortic SLN's. Lymphatic channels were identified travelling to the following visualized sentinel lymph node: left external iliac SLN. On the right, mapping in a channel was noted along the medial leaf of the broad ligament but no SLN identified. This SLN was separated from their surrounding lymphatic tissue, removed and sent for permanent pathology.  On the left, attention was turned toward lysing adhesions of the colon to the side wall. During manipulation of the uterus to access the adnexa, the larger cyst was torn, revealing blood blood versus necrotic tissue. A combination of sharp and blunt dissection was used to free the adnexa from the sigmoid  mesentery. Once freed, the hysterectomy was started.  The ureter was again noted to be on the medial leaf of the broad ligament.  The peritoneum above the ureter was incised and stretched and the infundibulopelvic ligament was skeletonized, cauterized and cut.  The posterior peritoneum was taken down to the level of the KOH ring.  The anterior peritoneum was also taken down; given anterior fibroid and adhesions between the bladder and and cervix.  The bladder flap was created to the level of the KOH ring, this dissection was performed slowly and meticulously.  The uterine artery on the right side was skeletonized, cauterized and cut in the normal manner.  A similar procedure was performed on the left.  The colpotomy was made and the uterus, cervix, bilateral ovaries and tubes were amputated and placed in an Endocatch bag to be delivered through a mini-lap given size of specimen.  Pedicles were inspected and excellent hemostasis was achieved.    The colpotomy at the vaginal cuff was closed with Vicryl on a CT1 needle in a running manner.  Irrigation was used and excellent hemostasis was achieved.  Given need to restart anticoagulation and anti-platelet therapy, [place] was placed on all surgical beds. At this point in the procedure was completed.  Robotic instruments were removed under direct visulaization.  The robot was undocked.   With the abdomen still insufflated,  the supraumbilical trocar was removed and the incision lengthened to 6-8 cm. This was done with the scalpel and carried down to the peritoneum with monopolar electrocautery. The peritoneal incision was extended. The bag containing the uterine specimen was delivered through the incision.   The fascia of the mini-lap was closed with running #1 looped PDS. The subcutaneous tissue was irrigated and Exparel was injected. The subcutaneous tissue was closed with 2-0 Vicryl.  The fascia at the 10-12 mm port was closed with 0 Vicryl on a UR-5 needle.   The subcuticular tissue was closed with 4-0 Vicryl and the skin was closed with 4-0 Monocryl in a subcuticular manner.  Dermabond was applied.    The vagina was swabbed with bleeding noted. The posterior vaginal tear was repaired with 2-0 Vicryl in running manner. Bleeding noted from raw mucosal surfaces along bilateral vaginal sidewalls. Some figure of eight stitches were placed along the right sidewall. The decision was then made to pack the vagina overnight.  Foley catheter was left in place.   All sponge, lap and needle counts were correct x  3.   The patient was transferred to the recovery room in stable condition.  Jeral Pinch, MD

## 2020-12-18 NOTE — Anesthesia Postprocedure Evaluation (Signed)
Anesthesia Post Note  Patient: Natalie [name]  Procedure(s) Performed: XI ROBOTIC ASSISTED TOTAL HYSTERECTOMY WITH BILATERAL SALPINGO OOPHORECTOMY (Bilateral: Abdomen) SENTINEL NODE BIOPSY (Abdomen) LAPAROTOMY (Abdomen)     Patient location during evaluation: PACU Anesthesia Type: General Level of consciousness: sedated and patient cooperative Pain management: pain level controlled Vital Signs Assessment: post-procedure vital signs reviewed and stable Respiratory status: spontaneous breathing Cardiovascular status: stable Anesthetic complications: no   No notable events documented.  Last Vitals:  Vitals:   12/18/20 1830 12/18/20 1845  BP: (!) 106/49 107/60  Pulse: 78 77  Resp: 18 17  Temp:  36.4 C  SpO2: 100% 100%    Last Pain:  Vitals:   12/18/20 1845  TempSrc:   PainSc: 0-No pain                 Nolon Nations

## 2020-12-18 NOTE — Anesthesia Procedure Notes (Signed)
Procedure Name: Intubation Date/Time: 12/18/2020 3:23 PM Performed by: Rosaland Lao, CRNA Pre-anesthesia Checklist: Patient identified, Emergency Drugs available, Suction available and Patient being monitored Patient Re-evaluated:Patient Re-evaluated prior to induction Oxygen Delivery Method: Circle system utilized Preoxygenation: Pre-oxygenation with 100% oxygen Induction Type: IV induction Ventilation: Mask ventilation without difficulty Laryngoscope Size: Miller and 2 Grade View: Grade I Tube type: Oral Tube size: 7.0 mm Number of attempts: 1 Airway Equipment and Method: Stylet and Oral airway Placement Confirmation: ETT inserted through vocal cords under direct vision, positive ETCO2 and breath sounds checked- equal and bilateral Secured at: 21 cm Tube secured with: Tape Dental Injury: Teeth and Oropharynx as per pre-operative assessment

## 2020-12-18 NOTE — Plan of Care (Signed)

## 2020-12-18 NOTE — Interval H&P Note (Signed)
History and Physical Interval Note:  12/18/2020 2:16 PM  [name]  has presented today for surgery, with the diagnosis of AT [place].  The various methods of treatment have been discussed with the patient and family. After consideration of risks, benefits and other options for treatment, the patient has consented to  Procedure(s): XI ROBOTIC ASSISTED TOTAL HYSTERECTOMY WITH BILATERAL SALPINGO OOPHORECTOMY (Bilateral) SENTINEL NODE BIOPSY (N/A) as a surgical intervention.  [name] history has been reviewed, patient examined, no change in status, stable for surgery.  I have reviewed the patient's chart and labs.  Questions were answered to the patient's satisfaction.     Natalie [name]

## 2020-12-18 NOTE — Brief Op Note (Signed)
12/18/2020  6:03 PM  PATIENT:  Natalie [name]  70 y.o. female  PRE-OPERATIVE DIAGNOSIS:  AT LEAST COMPLEX ENDOMETRIAL ATYPICAL HYPERPLASIA  POST-OPERATIVE DIAGNOSIS:  AT LEAST COMPLEX ENDOMETRIAL ATYPICAL HYPERPLASIA  PROCEDURE:  Procedure(s): XI ROBOTIC ASSISTED TOTAL HYSTERECTOMY WITH BILATERAL SALPINGO OOPHORECTOMY (Bilateral) SENTINEL NODE BIOPSY (N/A) LAPAROTOMY  SURGEON:  Surgeon(s) and Role:    Lafonda Mosses, MD - Primary  ASSISTANTS: Lahoma Crocker MD   ANESTHESIA:   local and general  EBL:  300 mL   BLOOD ADMINISTERED:none  DRAINS: none   LOCAL MEDICATIONS USED:  MARCAINE     SPECIMEN:  uterus, cervix, bilateral tubes and ovaries, left SLN   DISPOSITION OF SPECIMEN:  PATHOLOGY  COUNTS:  YES  TOURNIQUET:  * No tourniquets in log *  DICTATION: .Note written in EPIC  PLAN OF CARE: Admit for overnight observation  PATIENT DISPOSITION:  PACU - hemodynamically stable.   Delay start of Pharmacological VTE agent (>24hrs) due to surgical blood loss or risk of bleeding: not applicable

## 2020-12-19 ENCOUNTER — Encounter (HOSPITAL_COMMUNITY): Payer: Self-pay | Admitting: Gynecologic Oncology

## 2020-12-19 DIAGNOSIS — U071 COVID-19: Secondary | ICD-10-CM | POA: Diagnosis present

## 2020-12-19 DIAGNOSIS — E039 Hypothyroidism, unspecified: Secondary | ICD-10-CM | POA: Diagnosis present

## 2020-12-19 DIAGNOSIS — K660 Peritoneal adhesions (postprocedural) (postinfection): Secondary | ICD-10-CM | POA: Diagnosis present

## 2020-12-19 DIAGNOSIS — F32A Depression, unspecified: Secondary | ICD-10-CM | POA: Diagnosis present

## 2020-12-19 DIAGNOSIS — D649 Anemia, unspecified: Secondary | ICD-10-CM

## 2020-12-19 DIAGNOSIS — Z8249 Family history of ischemic heart disease and other diseases of the circulatory system: Secondary | ICD-10-CM | POA: Diagnosis not present

## 2020-12-19 DIAGNOSIS — N8502 Endometrial intraepithelial neoplasia [EIN]: Secondary | ICD-10-CM | POA: Diagnosis not present

## 2020-12-19 DIAGNOSIS — Z820 Family history of epilepsy and other diseases of the nervous system: Secondary | ICD-10-CM | POA: Diagnosis not present

## 2020-12-19 DIAGNOSIS — Z833 Family history of diabetes mellitus: Secondary | ICD-10-CM | POA: Diagnosis not present

## 2020-12-19 DIAGNOSIS — D62 Acute posthemorrhagic anemia: Secondary | ICD-10-CM | POA: Diagnosis present

## 2020-12-19 DIAGNOSIS — C541 Malignant neoplasm of endometrium: Secondary | ICD-10-CM | POA: Diagnosis present

## 2020-12-19 DIAGNOSIS — D259 Leiomyoma of uterus, unspecified: Secondary | ICD-10-CM | POA: Diagnosis present

## 2020-12-19 DIAGNOSIS — Z8673 Personal history of transient ischemic attack (TIA), and cerebral infarction without residual deficits: Secondary | ICD-10-CM | POA: Diagnosis not present

## 2020-12-19 DIAGNOSIS — G43909 Migraine, unspecified, not intractable, without status migrainosus: Secondary | ICD-10-CM | POA: Diagnosis present

## 2020-12-19 DIAGNOSIS — M810 Age-related osteoporosis without current pathological fracture: Secondary | ICD-10-CM | POA: Diagnosis present

## 2020-12-19 LAB — CBC
HCT: 23.5 % — ABNORMAL LOW (ref 36.0–46.0)
Hemoglobin: 7.7 g/dL — ABNORMAL LOW (ref 12.0–15.0)
MCH: 31 pg (ref 26.0–34.0)
MCHC: 32.8 g/dL (ref 30.0–36.0)
MCV: 94.8 fL (ref 80.0–100.0)
Platelets: 282 10*3/uL (ref 150–400)
RBC: 2.48 MIL/uL — ABNORMAL LOW (ref 3.87–5.11)
RDW: 12.4 % (ref 11.5–15.5)
WBC: 7.6 10*3/uL (ref 4.0–10.5)
nRBC: 0 % (ref 0.0–0.2)

## 2020-12-19 LAB — HEMOGLOBIN AND HEMATOCRIT, BLOOD
HCT: 23.5 % — ABNORMAL LOW (ref 36.0–46.0)
HCT: 25.9 % — ABNORMAL LOW (ref 36.0–46.0)
Hemoglobin: 7.7 g/dL — ABNORMAL LOW (ref 12.0–15.0)
Hemoglobin: 8.6 g/dL — ABNORMAL LOW (ref 12.0–15.0)

## 2020-12-19 LAB — BASIC METABOLIC PANEL
Anion gap: 6 (ref 5–15)
BUN: 14 mg/dL (ref 8–23)
CO2: 21 mmol/L — ABNORMAL LOW (ref 22–32)
Calcium: 8.4 mg/dL — ABNORMAL LOW (ref 8.9–10.3)
Chloride: 109 mmol/L (ref 98–111)
Creatinine, Ser: 0.63 mg/dL (ref 0.44–1.00)
GFR, Estimated: >60 mL/min (ref >60)
Glucose, Bld: 165 mg/dL — ABNORMAL HIGH (ref 70–99)
Potassium: 4.5 mmol/L (ref 3.5–5.1)
Sodium: 136 mmol/L (ref 135–145)

## 2020-12-19 LAB — PREPARE RBC (CROSSMATCH)

## 2020-12-19 MED ORDER — ENOXAPARIN SODIUM 40 MG/0.4ML IJ SOSY
40.0000 mg | PREFILLED_SYRINGE | INTRAMUSCULAR | Status: DC
  Administered 2020-12-19 – 2020-12-20 (×2): 40 mg via SUBCUTANEOUS
  Filled 2020-12-19 (×2): qty 0.4

## 2020-12-19 MED ORDER — DIPHENHYDRAMINE HCL 25 MG PO CAPS
25.0000 mg | ORAL_CAPSULE | Freq: Once | ORAL | Status: AC
  Administered 2020-12-19: 25 mg via ORAL
  Filled 2020-12-19: qty 1

## 2020-12-19 MED ORDER — SODIUM CHLORIDE 0.9% IV SOLUTION: Freq: Once | INTRAVENOUS | Status: AC

## 2020-12-19 MED ORDER — CHLORHEXIDINE GLUCONATE CLOTH 2 % EX PADS
6.0000 | MEDICATED_PAD | Freq: Every day | CUTANEOUS | Status: DC
  Administered 2020-12-19 – 2020-12-20 (×2): 6 via TOPICAL

## 2020-12-19 MED ORDER — ACETAMINOPHEN 325 MG PO TABS
650.0000 mg | ORAL_TABLET | Freq: Once | ORAL | Status: AC
  Administered 2020-12-19: 650 mg via ORAL
  Filled 2020-12-19: qty 2

## 2020-12-19 MED ORDER — BUPROPION HCL ER (SR) 150 MG PO TB12
150.0000 mg | ORAL_TABLET | Freq: Two times a day (BID) | ORAL | Status: DC
  Administered 2020-12-19 – 2020-12-20 (×3): 150 mg via ORAL
  Filled 2020-12-19 (×3): qty 1

## 2020-12-19 NOTE — Progress Notes (Signed)
Blood consent obtained for transfusion.  Pt tolerating well. Pt has voided post foley removal. Unable to measure amount.  Pt refuse bed alarm on and is stand by assist. She states she will call for help. Non skid socks intact, Call bell in reach.

## 2020-12-19 NOTE — Progress Notes (Signed)
1 Day Post-Op Procedure(s) (LRB): XI ROBOTIC ASSISTED TOTAL HYSTERECTOMY WITH BILATERAL SALPINGO OOPHORECTOMY (Bilateral) SENTINEL NODE BIOPSY (N/A) LAPAROTOMY  Subjective: Patient reports tolerating a small amount of solid food last pm but nothing this am. Passing flatus. Pain controlled. Had an issue with the catheter earlier this am with the strong feeling of needing to urinate. She had some vaginal bleeding overnight. No nausea, emesis, chest pain, dyspnea. No concerns voiced.  Objective: Vital signs in last 24 hours: Temp:  [97.6 F (36.4 C)-98.5 F (36.9 C)] 97.8 F (36.6 C) (11/16 1300) Pulse Rate:  [68-84] 78 (11/16 1240) Resp:  [15-19] 16 (11/16 1300) BP: (99-133)/(44-70) 103/64 (11/16 1300) SpO2:  [100 %] 100 % (11/16 1300) Last BM Date: 12/18/20  Intake/Output from previous day: 11/15 0701 - 11/16 0700 In: 1671.3 [P.O.:360; I.V.:1311.3] Out: 950 [Urine:650; Blood:300]  Physical Examination: General: alert, cooperative, and no distress Resp: clear to auscultation bilaterally Cardio: regular rate and rhythm, S1, S2 normal, no murmur, click, rub or gallop GI: soft, non-tender; bowel sounds normal; no masses,  no organomegaly and incision: lap sites to the abdomen with dermabond intact without drainage. Mini laparotomy incision with op site dressing intact with no drainage noted underneath. Extremities: extremities normal, atraumatic, no cyanosis or edema Vaginal Bleeding: Vaginal packing removed by Dr. Berline Lopes without difficulty. Old appearing blood on the packing. No significant bleeding noted from the vagina when packing removed.  Foley in place with clear, yellow urine. Foley removed by Dr. Berline Lopes after packing removal without difficulty.  Labs: WBC/Hgb/Hct/Plts:  --/7.7/23.5/-- (11/16 1518) BUN/Cr/glu/ALT/AST/amyl/lip:  14/0.63/--/--/--/--/-- (11/16 3437)  Assessment: 70 y.o. s/p Procedure(s): XI ROBOTIC ASSISTED TOTAL HYSTERECTOMY WITH BILATERAL SALPINGO  OOPHORECTOMY SENTINEL NODE BIOPSY LAPAROTOMY: stable Pain:  Pain is well-controlled on PRN medications.  Heme: Hgb 7.7 and Hct 23.5 this am, same on repeat H&H at 8 am today. Acute on chronic blood loss with anemia-plan for transfusion of 1 unit PRBCs. 300 cc estimated blood loss from surgery. Pre-op values with Hgb 10 and Hct 31.6.   ID: Positive COVID test on 12/18/20-pt asymptomatic at this time. On airborne precautions.  CV: BP and HR stable. Continue to monitor with routine vital sign assessments. Hx of CVA 02/28/2020-on xarelto and plavix pre-operatively.  GI:  Tolerating po: Yes. Antiemetics ordered as needed.  GU: Adequate urine output from foley overnight. Foley removed this am along with vaginal packing. Creatinine 0.63 this am.    FEN: No critical values on am metabolic panel.   Prophylaxis: SCDs ordered. To receive one dose of lovenox prophylactic dose prior to discharge and will need one dose tomorrow then resume xarelto the next day. Plan to resume plavix 5-7 days after surgery per Dr. Berline Lopes based on how patient is doing at home.  Plan: Plan for 1 unit of PRBCs Diet as tolerated Due to void Encourage ambulation, IS use Possible discharge later today if meeting discharge milestones   LOS: 0 days    Natalie [name] 12/19/2020, 1:04 PM

## 2020-12-19 NOTE — Progress Notes (Signed)
GYN Oncology Progress Note  Patient alert, oriented, resting in bed with family at the bedside. She continues to report abdominal soreness with movement. She has only ambulated to the bathroom and states that took most of her energy to do that. Denies lightheadedness and dizziness but states she does not feel she would be able to make it up the stairs at her house due to the weakness. She is voiding since foley removal. Tolerating food with no nausea or emesis. She still has 1/2 bag of blood left to infuse. Given current symptoms, plan for discharge in the am if patient meeting milestones and symptoms improved. Discussed increasing mobility, IS use. No needs voiced.

## 2020-12-20 ENCOUNTER — Ambulatory Visit: Payer: Medicare Other

## 2020-12-20 LAB — BASIC METABOLIC PANEL
Anion gap: 6 (ref 5–15)
BUN: 13 mg/dL (ref 8–23)
CO2: 22 mmol/L (ref 22–32)
Calcium: 8.1 mg/dL — ABNORMAL LOW (ref 8.9–10.3)
Chloride: 110 mmol/L (ref 98–111)
Creatinine, Ser: 0.69 mg/dL (ref 0.44–1.00)
GFR, Estimated: >60 mL/min (ref >60)
Glucose, Bld: 102 mg/dL — ABNORMAL HIGH (ref 70–99)
Potassium: 3.6 mmol/L (ref 3.5–5.1)
Sodium: 138 mmol/L (ref 135–145)

## 2020-12-20 LAB — CBC
HCT: 26.2 % — ABNORMAL LOW (ref 36.0–46.0)
Hemoglobin: 8.6 g/dL — ABNORMAL LOW (ref 12.0–15.0)
MCH: 30.5 pg (ref 26.0–34.0)
MCHC: 32.8 g/dL (ref 30.0–36.0)
MCV: 92.9 fL (ref 80.0–100.0)
Platelets: 236 10*3/uL (ref 150–400)
RBC: 2.82 MIL/uL — ABNORMAL LOW (ref 3.87–5.11)
RDW: 13.6 % (ref 11.5–15.5)
WBC: 6.4 10*3/uL (ref 4.0–10.5)
nRBC: 0 % (ref 0.0–0.2)

## 2020-12-20 LAB — BPAM RBC
Blood Product Expiration Date: 202212082359
ISSUE DATE / TIME: 202211161219
Unit Type and Rh: 6200

## 2020-12-20 LAB — TYPE AND SCREEN
ABO/RH(D): A POS
Antibody Screen: NEGATIVE
Unit division: 0

## 2020-12-20 MED ORDER — RIVAROXABAN 20 MG PO TABS: 20.0000 mg | ORAL_TABLET | Freq: Every day | ORAL | 0 refills | Status: DC

## 2020-12-20 NOTE — Discharge Summary (Signed)
Physician Discharge Summary  Patient ID: Natalie [name] MRN: [id number] DOB/AGE: 1950-05-13 70 y.o.  Admit date: 12/18/2020 Discharge date: 12/20/2020  Admission Diagnoses: Complex endometrial hyperplasia with atypia  Discharge Diagnoses:  Principal Problem:   Complex endometrial hyperplasia with atypia Active Problems:   Acute on chronic anemia   Postoperative anemia due to acute blood loss   Discharged Condition:  The patient is in good condition and stable for discharge.  Hospital Course: On 12/18/2020, the patient underwent the following: Procedure(s): XI ROBOTIC ASSISTED TOTAL HYSTERECTOMY WITH BILATERAL SALPINGO OOPHORECTOMY SENTINEL NODE BIOPSY, MINI LAPAROTOMY FOR SPECIMEN DELIVERY, REPAIR OF VAGINAL LACERATIONS. On POD1, the vaginal packing and foley were removed with minimal bleeding noted vaginally. She was transfused one unit of PRBCs for hgb of 7.7 and Hct 23.5 for acute on chronic anemia. Her hemoglobin responded appropriately on POD 2 labs and her post-op weakness improved. She was discharged to home on postoperative day 2 tolerating a regular diet, ambulating, voiding, pain controlled, passing flatus, no vaginal bleeding reported. She was advised to resume her xarelto on 12/21/2020 and continue to hold plavix for a total of 5-7 days post-operatively per Dr. Berline Lopes.   Consults: None  Significant Diagnostic Studies: Labs  Treatments: surgery: see above, transfusion of 1 unit PRBCs  Discharge Exam: Blood pressure 120/67, pulse 71, temperature 98 F (36.7 C), temperature source Oral, resp. rate 18, height 5\' 5"  (1.651 m), weight 167 lb 1.7 oz (75.8 kg), SpO2 97 %. General appearance: alert, cooperative, and no distress Resp: clear to auscultation bilaterally Cardio: regular rate and rhythm, S1, S2 normal, no murmur, click, rub or gallop GI: soft, non-tender; bowel sounds normal; no masses,  no organomegaly Extremities: extremities normal, atraumatic, no cyanosis or  edema Lap sites to the abdomen with mild ecchymosis intact with dermabond, no drainage. Mini laparotomy incision with op site dressing in place with dermabond underneath with no drainage.  Disposition: Discharge disposition: 01-Home or Self Care      Discharge Instructions     Call MD for:  difficulty breathing, headache or visual disturbances   Complete by: As directed    Call MD for:  extreme fatigue   Complete by: As directed    Call MD for:  hives   Complete by: As directed    Call MD for:  persistant dizziness or light-headedness   Complete by: As directed    Call MD for:  persistant nausea and vomiting   Complete by: As directed    Call MD for:  redness, tenderness, or signs of infection (pain, swelling, redness, odor or green/yellow discharge around incision site)   Complete by: As directed    Call MD for:  severe uncontrolled pain   Complete by: As directed    Call MD for:  temperature >100.4   Complete by: As directed    Diet - low sodium heart healthy   Complete by: As directed    Discharge wound care:   Complete by: As directed    You will have a white honeycomb dressing over your larger incision. This dressing can be removed 5 days after surgery and you do not need to reapply a new dressing. Once you remove the dressing, you will notice that you have the surgical glue (dermabond) on the incision and this will peel off on its own. You can get this dressing wet in the shower the days after surgery prior to removal on the 5th day.   Driving Restrictions   Complete by: As  directed    No driving for 1 week if you were cleared to drive before surgery.  Do not take narcotics and drive.   Increase activity slowly   Complete by: As directed    Lifting restrictions   Complete by: As directed    No lifting greater than 10 lbs, pushing, pulling, straining for 6 weeks   Sexual Activity Restrictions   Complete by: As directed    No sexual activity, nothing in the vagina, for 8  weeks.      Allergies as of 12/20/2020       Reactions   Sulfamethoxazole-trimethoprim Hives        Medication List     STOP taking these medications    clopidogrel 75 MG tablet Commonly known as: Plavix   megestrol 40 MG tablet Commonly known as: MEGACE       TAKE these medications    atorvastatin 40 MG tablet Commonly known as: LIPITOR Take 1 tablet (40 mg total) by mouth daily.   buPROPion 150 MG 12 hr tablet Commonly known as: WELLBUTRIN SR Take 150 mg by mouth 2 (two) times daily.   citalopram 40 MG tablet Commonly known as: CELEXA TAKE 1 TABLET BY MOUTH EVERY DAY   clindamycin-benzoyl peroxide gel Commonly known as: BenzaClin Apply topically 2 (two) times daily. What changed:  how much to take when to take this reasons to take this   levothyroxine 75 MCG tablet Commonly known as: SYNTHROID TAKE 1 TABLET(75 MCG) BY MOUTH DAILY BEFORE BREAKFAST   multivitamin with minerals Tabs tablet Take 1 tablet by mouth daily.   polyethylene glycol 17 g packet Commonly known as: MIRALAX / GLYCOLAX Take 17 g by mouth daily. Can use before surgery for constipation   polyvinyl alcohol 1.4 % ophthalmic solution Commonly known as: LIQUIFILM TEARS Place 1 drop into both eyes daily as needed for dry eyes.   rivaroxaban 20 MG Tabs tablet Commonly known as: XARELTO Take 1 tablet (20 mg total) by mouth daily with supper. Resume on 12/21/2020 Start taking on: December 21, 2020 What changed: additional instructions   senna-docusate 8.6-50 MG tablet Commonly known as: Senokot-S Take 2 tablets by mouth at bedtime. For AFTER surgery, do not take if having diarrhea   traMADol 50 MG tablet Commonly known as: ULTRAM Take 1 tablet (50 mg total) by mouth every 6 (six) hours as needed for severe pain. For AFTER surgery only, do not take and drive   Ubrelvy 081 MG Tabs Generic drug: Ubrogepant Take 100 mg by mouth daily as needed (migraines).   Vitamin B-12 1000 MCG  Subl Place 1,000 mcg under the tongue at bedtime.   Vitamin D (Ergocalciferol) 1.25 MG (50000 UNIT) Caps capsule Commonly known as: DRISDOL TAKE 1 CAPSULE BY MOUTH EVERY 7 DAYS What changed:  when to take this additional instructions               Discharge Care Instructions  (From admission, onward)           Start     Ordered   12/20/20 0000  Discharge wound care:       Comments: You will have a white honeycomb dressing over your larger incision. This dressing can be removed 5 days after surgery and you do not need to reapply a new dressing. Once you remove the dressing, you will notice that you have the surgical glue (dermabond) on the incision and this will peel off on its own. You can get this dressing  wet in the shower the days after surgery prior to removal on the 5th day.   12/20/20 1049            Follow-up Information     Lafonda Mosses, MD Follow up on 12/24/2020.   Specialty: Gynecologic Oncology Why: at 4pm will be a PHONE visit where Dr. Berline Lopes will call you with the results of your pathology. IN PERSON visit will be on 01/07/2021 at 3:15pm at the [facility]. Contact information: [place] [address] [phone]                 Greater than thirty minutes were spend for face to face discharge instructions and discharge orders/summary in EPIC.   Signed: Dorothyann Gibbs 12/20/2020, 11:27 AM

## 2020-12-20 NOTE — Discharge Instructions (Signed)
12/20/2020  You can resume your xarelto tomorrow, 12/21/2020. Do not restart your plavix for 5-7 days from surgery. Our office will reach out to you to check on you after surgery and to assess for any vaginal bleeding etc and let you know when to resume your plavix.  You will have a white honeycomb dressing over your larger incision. This dressing can be removed 5 days after surgery and you do not need to reapply a new dressing. Once you remove the dressing, you will notice that you have the surgical glue (dermabond) on the incision and this will peel off on its own. You can get this dressing wet in the shower the days after surgery prior to removal on the 5th day.   Activity: 1. Be up and out of the bed during the day.  Take a nap if needed.  You may walk up steps but be careful and use the hand rail.  Stair climbing will tire you more than you think, you may need to stop part way and rest.   2. No lifting or straining over 10 lbs, pushing, pulling, straining for 6 weeks.  3. No driving for 1 week(s) if you were cleared to drive before surgery.  Do not drive if you are taking narcotic pain medicine. You need to make sure your reaction time has returned and you can brake safely.  4. Shower daily.  Use your regular soap to bathe and when finished pat your incision dry; don't rub.  No tub baths until cleared by your surgeon.   5. No sexual activity and nothing in the vagina for 8 weeks.  6. You may experience a small amount of clear drainage from your incisions, which is normal.  If the drainage persists or increases, please call the office.  7. You may experience vaginal spotting after surgery or around the 6-8 week mark from surgery when the stitches at the top of the vagina begin to dissolve.  The spotting is normal but if you experience heavy bleeding, call our office.  8. Take Tylenol first for pain and only use narcotic pain medication for severe pain not relieved by the Tylenol.  Monitor  your Tylenol intake to a max of 4,000 mg.   Diet: 1. Low sodium Heart Healthy Diet is recommended.  2. It is safe to use a laxative, such as Miralax or Colace, if you have difficulty moving your bowels. You can take Sennakot at bedtime every evening to keep bowel movements regular and to prevent constipation.    Wound Care: 1. Keep clean and dry.  Shower daily.  Reasons to call the Doctor: Fever - Oral temperature greater than 100.4 degrees Fahrenheit Foul-smelling vaginal discharge Difficulty urinating Nausea and vomiting Increased pain at the site of the incision that is unrelieved with pain medicine. Difficulty breathing with or without chest pain New calf pain especially if only on one side Sudden, continuing increased vaginal bleeding with or without clots.   Contacts: For questions or concerns you should contact:  Dr. Jeral Pinch at [phone]  [name], NP at [phone]  After Hours: call [phone] and have the GYN Oncologist paged/contacted

## 2020-12-20 NOTE — Progress Notes (Signed)
Discharge instructions given to pt and family at bedside. Packet given to pt. All questions answered. IVs removed.

## 2020-12-20 NOTE — Plan of Care (Signed)

## 2020-12-21 ENCOUNTER — Telehealth: Payer: Self-pay

## 2020-12-21 NOTE — Telephone Encounter (Signed)
Attempted to follow up with patient after her procedure. Left message requesting return call.

## 2020-12-21 NOTE — Telephone Encounter (Signed)
Spoke with Natalie [name] this morning. She states she is eating, drinking and urinating well. She has not had a BM yet and is not passing gas. She is taking miralax daily and encouraged her to drink plenty of water. Instructed to take senokot in addition to miralax if needed. She denies fever or chills. Incisions are dry and intact. Instructed her to remove honeycomb dressing 5 days after surgery. Patient reports being more sore than in pain. She has tylenol and tramadol if needed. She states she has some vaginal bleeding. When she went to the bathroom this morning she states there was bright red blood in the toilet. She is wearing a pad now and is having light spotting. Natalie John, NP notified. Instructed patient to monitor vaginal bleeding and notify our office if this increases.  Patient resumed her xarelto today and will wait to take her plavix until speaking with Dr. Berline Lopes on 12/24/20.  Instructed to call office with any fever, chills, purulent drainage, uncontrolled pain or any other questions or concerns. Patient verbalizes understanding.   Pt aware of post op appointments as well as the office number [phone] and after hours number [phone] to call if she has any questions or concerns

## 2020-12-21 NOTE — Telephone Encounter (Signed)
Following up with Natalie [name] regarding her vaginal bleeding. Patient states "its not much of anything, just a little spotting now." She reports some soreness in her abdomen but overall she is doing well. Instructed to call with any increase in bleeding, pain, or any other questions or concerns. Patient verbalized understanding and appreciative of call.

## 2020-12-24 ENCOUNTER — Inpatient Hospital Stay: Payer: Medicare Other | Admitting: Gynecologic Oncology

## 2020-12-24 ENCOUNTER — Telehealth: Payer: Self-pay | Admitting: Oncology

## 2020-12-24 NOTE — Telephone Encounter (Signed)
Called Natalie [name] and rescheduled her telephone visit with Dr. Berline Lopes to 12/26/2020 at 3:45 due to pathology results not available today.  Also advised her to continue to hold Plavix until she talks to Dr. Berline Lopes on 12/26/20. She verbalized understanding and agreement in teach back mode.

## 2020-12-25 ENCOUNTER — Ambulatory Visit: Payer: Medicare Other | Admitting: Physical Therapy

## 2020-12-25 ENCOUNTER — Encounter: Payer: Self-pay | Admitting: Gynecologic Oncology

## 2020-12-26 ENCOUNTER — Telehealth: Payer: Self-pay | Admitting: Family Medicine

## 2020-12-26 ENCOUNTER — Encounter: Payer: Self-pay | Admitting: Gynecologic Oncology

## 2020-12-26 ENCOUNTER — Inpatient Hospital Stay (HOSPITAL_BASED_OUTPATIENT_CLINIC_OR_DEPARTMENT_OTHER): Payer: Medicare Other | Admitting: Gynecologic Oncology

## 2020-12-26 DIAGNOSIS — Z90722 Acquired absence of ovaries, bilateral: Secondary | ICD-10-CM

## 2020-12-26 DIAGNOSIS — Z90710 Acquired absence of both cervix and uterus: Secondary | ICD-10-CM

## 2020-12-26 DIAGNOSIS — C541 Malignant neoplasm of endometrium: Secondary | ICD-10-CM

## 2020-12-26 DIAGNOSIS — N939 Abnormal uterine and vaginal bleeding, unspecified: Secondary | ICD-10-CM

## 2020-12-26 HISTORY — DX: Malignant neoplasm of endometrium: C54.1

## 2020-12-26 NOTE — Progress Notes (Signed)
Gynecologic Oncology Telehealth Consult Note: Gyn-Onc  I connected with Natalie [name] on 12/26/20 at  3:45 PM EST by telephone and verified that I am speaking with the correct person using two identifiers.  I discussed the limitations, risks, security and privacy concerns of performing an evaluation and management service by telemedicine and the availability of in-person appointments. I also discussed with the patient that there may be a patient responsible charge related to this service. The patient expressed understanding and agreed to proceed.  Other persons participating in the visit and their role in the encounter: None.  Patient's location: Home Provider's location: [facility]  Reason for Visit: Follow-up after surgery  Treatment History: 12/18/2020: Robotic hysterectomy with BSO, sentinel lymph node injection and sentinel lymph node biopsy on the left, mini laparotomy for specimen removal, repair of vaginal lacerations.  Interval History: Patient reports overall doing well since surgery and feeling better with each day.  She has some fatigue depending on her activity level.  Reports normal bowel function and has not needed any medications.  Continues to have urinary frequency going every 2-3 hours, similar to surgery.  She also has urgency and may leak if she does not get to the bathroom quickly enough.  She continues to notice that her stream is spraying.  Both of these symptoms were also present before surgery.  She has been on medications previously for urgency, prescribed by her PCP, and notes little or no change on medications.  In terms of vaginal bleeding, the patient continues to have some bleeding.  She changes her pad twice a day and describes the bleeding as similar to a very light period, slightly more than spotting.  She notes some blood in the toilet when she urinates.  She had 1 blood clot that she passed shortly after going home from the hospital, denies  any other blood clots.  Past Medical/Surgical History: Past Medical History:  Diagnosis Date   Acute bronchospasm due to viral infection 2020   Due to pollen   Anemia    history of   CVA (cerebral vascular accident) ([place]) 02/28/2020   and 04/19/20   Depressive disorder    Dyspnea    Dysrhythmia    Endometrial cancer ([place]) 12/26/2020   Epistaxis    Fatigue    Hearing loss    History of loop recorder    Hypothyroidism    Memory change    Migraine    Obesity (BMI 30.0-34.9)    lost 60 lbs   Osteoarthritis    Osteoporosis    Polyuria    Premature menopause    Seizures ([place]) 1994   history of mini seizures, possible migraine induced    Past Surgical History:  Procedure Laterality Date   ABLATION  2006   BUBBLE STUDY  04/23/2020   Procedure: BUBBLE STUDY;  Surgeon: Larey Dresser, MD;  Location: [place];  Service: Cardiovascular;;   CATARACT EXTRACTION W/ INTRAOCULAR LENS IMPLANT Bilateral    COLONOSCOPY     COLONOSCOPY WITH PROPOFOL N/A 12/22/2018   Procedure: COLONOSCOPY WITH PROPOFOL;  Surgeon: Virgel Manifold, MD;  Location: [facility] END[facility];  Service: Endoscopy;  Laterality: N/A;   EYE SURGERY  Spring 2018   Cataracts both eyes   HYSTEROSCOPY WITH D & C N/A 11/01/2020   Procedure: DILATATION AND CURETTAGE /HYSTEROSCOPY WITH MYOSURE;  Surgeon: Lafonda Mosses, MD;  Location: [facility];  Service: Gynecology;  Laterality: N/A;   LAPAROTOMY  12/18/2020   Procedure: LAPAROTOMY;  Surgeon: Lafonda Mosses, MD;  Location: [facility];  Service: Gynecology;;   LOOP RECORDER INSERTION N/A 04/23/2020   Procedure: LOOP RECORDER INSERTION;  Surgeon: Evans Lance, MD;  Location: [place] [facility];  Service: Cardiovascular;  Laterality: N/A;   ROBOTIC ASSISTED TOTAL HYSTERECTOMY WITH BILATERAL SALPINGO OOPHERECTOMY Bilateral 12/18/2020   Procedure: XI ROBOTIC ASSISTED TOTAL HYSTERECTOMY WITH BILATERAL SALPINGO OOPHORECTOMY;  Surgeon: Lafonda Mosses, MD;  Location: [facility];  Service: Gynecology;  Laterality: Bilateral;   SENTINEL NODE BIOPSY N/A 12/18/2020   Procedure: SENTINEL NODE BIOPSY;  Surgeon: Lafonda Mosses, MD;  Location: [facility];  Service: Gynecology;  Laterality: N/A;   TEE WITHOUT CARDIOVERSION N/A 04/23/2020   Procedure: TRANSESOPHAGEAL ECHOCARDIOGRAM (TEE);  Surgeon: Larey Dresser, MD;  Location[facility];  Service: Cardiovascular;  Laterality: N/A;   TONSILLECTOMY      Family History  Problem Relation Age of Onset   Early death Father        suicide   Depression Father    Diabetes Father    Cancer Father        prostate   Hearing loss Father        due to war   Alzheimer's disease Mother    Heart disease Brother    Atrial fibrillation Brother    Heart disease Maternal Aunt    Dementia Maternal Aunt    Heart disease Maternal Uncle    Dementia Maternal Grandmother    Heart disease Brother    Atrial fibrillation Brother    Colon cancer Neg Hx    Breast cancer Neg Hx    Ovarian cancer Neg Hx    Pancreatic cancer Neg Hx    Endometrial cancer Neg Hx     Social History   Socioeconomic History   Marital status: Single    Spouse name: Not on file   Number of children: 1   Years of education: Not on file   Highest education level: Bachelor's degree (e.g., BA, AB, BS)  Occupational History   Occupation: retired   Tobacco Use   Smoking status: Former    Packs/day: 0.50    Years: 6.00    Pack years: 3.00    Types: Cigarettes    Quit date: 02/11/1974    Years since quitting: 46.9   Smokeless tobacco: Never  Vaping Use   Vaping Use: Never used  Substance and Sexual Activity   Alcohol use: Yes    Comment: rare   Drug use: Never   Sexual activity: Not Currently    Comment: Don't use not sexually active  Other Topics Concern   Not on file  Social History Narrative   Raised an adopted child on her own   Working part time reviewed documents   Social Determinants of Radio broadcast assistant Strain: Not on  file  Food Insecurity: Not on file  Transportation Needs: Not on file  Physical Activity: Not on file  Stress: Not on file  Social Connections: Not on file    Current Medications:  Current Outpatient Medications:    atorvastatin (LIPITOR) 40 MG tablet, Take 1 tablet (40 mg total) by mouth daily., Disp: 90 tablet, Rfl: 1   buPROPion (WELLBUTRIN SR) 150 MG 12 hr tablet, Take 150 mg by mouth 2 (two) times daily., Disp: , Rfl:    citalopram (CELEXA) 40 MG tablet, TAKE 1 TABLET BY MOUTH EVERY DAY, Disp: 90 tablet, Rfl: 1   clindamycin-benzoyl peroxide (BENZACLIN) gel, Apply topically 2 (  two) times daily. (Patient taking differently: Apply 1 application topically 2 (two) times daily as needed (facial acne).), Disp: 50 g, Rfl: 0   Cyanocobalamin (VITAMIN B-12) 1000 MCG SUBL, Place 1,000 mcg under the tongue at bedtime., Disp: , Rfl:    levothyroxine (SYNTHROID) 75 MCG tablet, TAKE 1 TABLET(75 MCG) BY MOUTH DAILY BEFORE BREAKFAST, Disp: 90 tablet, Rfl: 1   Multiple Vitamin (MULTIVITAMIN WITH MINERALS) TABS tablet, Take 1 tablet by mouth daily., Disp: , Rfl:    polyethylene glycol (MIRALAX / GLYCOLAX) 17 g packet, Take 17 g by mouth daily. Can use before surgery for constipation, Disp: 14 each, Rfl: 0   polyvinyl alcohol (LIQUIFILM TEARS) 1.4 % ophthalmic solution, Place 1 drop into both eyes daily as needed for dry eyes., Disp: , Rfl:    rivaroxaban (XARELTO) 20 MG TABS tablet, Take 1 tablet (20 mg total) by mouth daily with supper. Resume on 12/21/2020, Disp: 30 tablet, Rfl: 0   senna-docusate (SENOKOT-S) 8.6-50 MG tablet, Take 2 tablets by mouth at bedtime. For AFTER surgery, do not take if having diarrhea, Disp: 30 tablet, Rfl: 0   traMADol (ULTRAM) 50 MG tablet, Take 1 tablet (50 mg total) by mouth every 6 (six) hours as needed for severe pain. For AFTER surgery only, do not take and drive, Disp: 10 tablet, Rfl: 0   Ubrogepant (UBRELVY) 100 MG TABS, Take 100 mg by mouth daily as needed  (migraines)., Disp: , Rfl:    Vitamin D, Ergocalciferol, (DRISDOL) 1.25 MG (50000 UNIT) CAPS capsule, TAKE 1 CAPSULE BY MOUTH EVERY 7 DAYS (Patient taking differently: Take 50,000 Units by mouth every Monday. TAKE 1 CAPSULE BY MOUTH EVERY 7 DAYS), Disp: 12 capsule, Rfl: 1  Review of Symptoms: Pertinent positives as per HPI.  Physical Exam: There were no vitals taken for this visit. Deferred given limitations of phone visit.  Laboratory & Radiologic Studies: A. LYMPH NODE, LEFT EXTERNAL ILIAC SENTINAL, BIOPSY:  - One lymph node, negative for malignancy (0/1).   B. UTERUS, CERVIX,  BILATERAL TUBES, HYSTERECTOMY AND BILATERAL  SALPINGECTOMY:  - Endometrium:       - Endometrioid endometrial adenocarcinoma, low-grade, arising in a  background of endometrioid intraepithelial neoplasia (EIN), with  extensive colonization of adenomyosis.       - See oncology table.  - Myometrium:       - Adenomyosis, involved by endometrioid endometrial adenocarcinoma.       - Leiomyomata uteri.  - Uterine cervix:       - Benign transformation zone.       - Negative for squamous intraepithelial lesion and malignancy.  - Fallopian tubes:       - No significant histopathologic change.  - Ovaries:       - Benign physiologic changes.   UTERUS, CARCINOMA OR CARCINOSARCOMA: Resection   Procedure: Total hysterectomy with bilateral salpingo-oophorectomy  Histologic Type: Endometrioid carcinoma, NOS  Histologic Grade: Low-grade  Myometrial Invasion:       Cannot be determined  Uterine Serosa Involvement: Not identified  Cervical stromal Involvement: Not identified  Extent of involvement of other tissue/organs: Not identified  Peritoneal/Ascitic Fluid: Not submitted/unknown  Lymphovascular Invasion: Not identified  Regional Lymph Nodes: Regional lymph nodes present  All regional lymph nodes negative for tumor cells       Pelvic Lymph Nodes Examined:  Sentinel: 1               Non-sentinel: 0  Total: 1       Pelvic Lymph Nodes with Metastasis: 0                          Macrometastasis: (>2.0 mm): 0                          Micrometastasis: (>0.2 mm and < 2.0 mm): 0                          Isolated Tumor Cells (<0.2 mm): 0                          Laterality of Lymph Node with Tumor: Not  applicable                             Extracapsular Extension: Not applicable   Distant Metastasis:       Distant Site(s) Involved: Not applicable  Pathologic Stage Classification (pTNM, AJCC 8th Edition): pT1a, pN0  Ancillary Studies: MMR/MSI testing will be ordered  Representative Tumor Block: B15  Comment(s): Pancytokeratin was performed on the lymph nodes and is  negative.   Assessment & Plan: Natalie [name] is a 70 y.o. 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  Patient appears to be meeting postoperative milestones and doing well at home.  With regard to her vaginal bleeding, we discussed restarting Plavix but if she were to have any increase in bleeding, that I would like her to discontinue it until I can see her.  I have asked her to call on Monday to give [place] a status update on her bleeding.  If she were to start having more bleeding, we will plan to have her come into the clinic for an exam on Monday or Tuesday.  Leading precautions reviewed.  Reviewed in detail her pathology report.  Depth of invasion is somewhat difficult to discern given the way the uterus was sampled as well as adenomyosis.  Overall, she seems to have less than 50% myometrial invasion.  Lymph node assessment was not done in one hemipelvis.  We will plan to have her case presented at our next tumor board to discuss pathology details including the size of her tumor.  My overall impression from her report is that she has both early stage but also low risk disease and despite no nodal evaluation in 1 hemipelvis, that her recurrence risk is likely low  enough that no adjuvant treatment is recommended.  I discussed the assessment and treatment plan with the patient. The patient was provided with an opportunity to ask questions and all were answered. The patient agreed with the plan and demonstrated an understanding of the instructions.   The patient was advised to call back or see an in-person evaluation if the symptoms worsen or if the condition fails to improve as anticipated.   22 minutes of total time was spent for this patient encounter, including preparation, face-to-face counseling with the patient and coordination of care, and documentation of the encounter.   Jeral Pinch, MD  Division of Gynecologic Oncology  Department of Obstetrics and Gynecology  [facility]

## 2020-12-28 LAB — SURGICAL PATHOLOGY

## 2020-12-30 ENCOUNTER — Other Ambulatory Visit: Payer: Self-pay | Admitting: Family Medicine

## 2020-12-30 DIAGNOSIS — E039 Hypothyroidism, unspecified: Secondary | ICD-10-CM

## 2020-12-31 ENCOUNTER — Encounter: Payer: Self-pay | Admitting: Gynecologic Oncology

## 2020-12-31 NOTE — Telephone Encounter (Signed)
Spoke to patient on Friday about biopsy results

## 2021-01-01 ENCOUNTER — Ambulatory Visit: Payer: Medicare Other | Admitting: Physical Therapy

## 2021-01-02 ENCOUNTER — Encounter: Payer: Self-pay | Admitting: Gynecologic Oncology

## 2021-01-03 ENCOUNTER — Ambulatory Visit: Payer: Medicare Other

## 2021-01-06 NOTE — Progress Notes (Unsigned)
Gynecologic Oncology Return Clinic Visit  01/07/21   Reason for Visit: follow-up after surgery, treatment discussion  Treatment History: Oncology History Overview Note  MMR IHC normal   Endometrial cancer ([place])  04/2020 Imaging   CT A/P in 04/2020 showed enlarged fibroid uterus; complex cystic mass in left ovary measuring up to 5cm. Pelvic ultrasound 05/01/20: Enlarged multi-fibroid uterus, endometrium 3.46m. Left ovary measures up to 7cm with a 5.8 x 4.1 x 4cm dominant septated cyst.  CA-125   04/2020 Tumor Marker   Patient's tumor was tested for the following markers: CA-125. Results of the tumor marker test revealed 592.   08/10/2020 Imaging   Pelvic ultrasound showed thickened heterogenous endometrial complex with somewhat nodular margins associated with a 3.8 cm diameter fluid collection within the endometrial canal.  8.2 cm cyst of the left ovary.   08/2020 Tumor Marker   Patient's tumor was tested for the following markers: CA-125. Results of the tumor marker test revealed 731.   08/27/2020 Initial Biopsy   EMB showing minute fragments of benign inactive endometrium, mostly blood and fibrin   11/01/2020 Surgery   D&C hysteroscopy, and hysteroscopic sampling of the endometrium.    Final pathology showed at least complex atypical hyperplasia.   11/29/2020 Imaging   Pelvic ultrasound shows thickened endometrial lining measuring 4.4 cm.  Normal-appearing right ovary.  Complex left adnexal cyst measures up to f 5.3 cm.  No flow seen   12/18/2020 Surgery   TRH/BSO, SLN injection with biopsy on let (no mapping on right), mini-lap for specimen removal, repair vaginal lacerations  Findings: On EUA, large globular uterus. On intra-abdominal entry, normal upper abdominal survey.Normal omentum, small and large bowel. Uterus 10cm, very globular and bulbous at the fundus. Multiple fibroids including 3cm lower uterine segment anterior fibroid and posterior 6cm fundal fibroid. Right adnexa  normal and atrophic. Mapping to channels along the right broad ligament, no obvious SLN. On the left, mapping successful to external iliac SLN. Left ovary replaced by 6cm cyst adherent to the broad ligament and sigmoid mesentery with old blood versus necrotic tissue within (cyst wall very easily ripped open with minimal manipulation of the adnexa. Second 2cm cystic component. No obvious findings of malignancy but would be stage II if cancer given adherence to surrounding pelvic structures. Some adhesions between the bladder and cervix. No obvious adenopathy. No ascites. Mini-lap required for specimen removal given size.  No frozen sent as it was not going to impact management. Goal was to keep the surgery under or close to 2 hours.   12/18/2020 Pathology Results   A. LYMPH NODE, LEFT EXTERNAL ILIAC SENTINAL, BIOPSY:  - One lymph node, negative for malignancy (0/1).   B. UTERUS, CERVIX,  BILATERAL TUBES, HYSTERECTOMY AND BILATERAL  SALPINGECTOMY:  - Endometrium:       - Endometrioid endometrial adenocarcinoma, low-grade, arising in a  background of endometrioid intraepithelial neoplasia (EIN), with  extensive colonization of adenomyosis.       - See oncology table.  - Myometrium:       - Adenomyosis, involved by endometrioid endometrial adenocarcinoma.       - Leiomyomata uteri.  - Uterine cervix:       - Benign transformation zone.       - Negative for squamous intraepithelial lesion and malignancy.  - Fallopian tubes:       - No significant histopathologic change.  - Ovaries:       - Benign physiologic changes.   COMMENT:   A. An  immunohistochemical study for pancytokeratin is negative. There is  no evidence of metastatic carcinoma.   B. Immunohistochemical studies show the tumor cells to be positive for  PR, and negative for Napsin A.  These findings would favor the above  diagnosis, and suggest against clear cell carcinoma. The definite extent  of myometrial invasion is less than  50%, but cannot be further  characterized, due to initial sampling of the entire myometrial surface,  and extensive colonization of adenomyosis throughout the myometrium  impacting evaluation of the orientation of these fragments. Full  thickness sections are without definite direct myometrial invasion, but  show extensive adenomyosis   UTERUS, CARCINOMA OR CARCINOSARCOMA: Resection   Procedure: Total hysterectomy with bilateral salpingo-oophorectomy  Histologic Type: Endometrioid carcinoma, NOS  Histologic Grade: Low-grade  Myometrial Invasion:       Cannot be determined  Uterine Serosa Involvement: Not identified  Cervical stromal Involvement: Not identified  Extent of involvement of other tissue/organs: Not identified  Peritoneal/Ascitic Fluid: Not submitted/unknown  Lymphovascular Invasion: Not identified  Regional Lymph Nodes: Regional lymph nodes present  All regional lymph nodes negative for tumor cells       Pelvic Lymph Nodes Examined:  Sentinel: 1               Non-sentinel: 0               Total: 1       Pelvic Lymph Nodes with Metastasis: 0                          Macrometastasis: (>2.0 mm): 0                          Micrometastasis: (>0.2 mm and < 2.0 mm): 0                          Isolated Tumor Cells (<0.2 mm): 0                          Laterality of Lymph Node with Tumor: Not  applicable                             Extracapsular Extension: Not applicable    17/00/1749 Initial Diagnosis   Endometrial cancer ([facility])    Interval History: ***  Past Medical/Surgical History: Past Medical History:  Diagnosis Date   Acute bronchospasm due to viral infection 2020   Due to pollen   Anemia    history of   CVA (cerebral vascular accident) ([place]) 02/28/2020   and 04/19/20   Depressive disorder    Dyspnea    Dysrhythmia    Endometrial cancer ([place]) 12/26/2020   Epistaxis    Fatigue    Hearing loss    History of loop recorder    Hypothyroidism    Memory  change    Migraine    Obesity (BMI 30.0-34.9)    lost 60 lbs   Osteoarthritis    Osteoporosis    Polyuria    Premature menopause    Seizures ([place]) 1994   history of mini seizures, possible migraine induced    Past Surgical History:  Procedure Laterality Date   ABLATION  2006   BUBBLE STUDY  04/23/2020   Procedure: BUBBLE STUDY;  Surgeon: Larey Dresser, MD;  Location:  [facility];  Service: Cardiovascular;;   CATARACT EXTRACTION W/ INTRAOCULAR LENS IMPLANT Bilateral    COLONOSCOPY     COLONOSCOPY WITH PROPOFOL N/A 12/22/2018   Procedure: COLONOSCOPY WITH PROPOFOL;  Surgeon: Virgel Manifold, MD;  Location: [facility];  Service: Endoscopy;  Laterality: N/A;   EYE SURGERY  Spring 2018   Cataracts both eyes   HYSTEROSCOPY WITH D & C N/A 11/01/2020   Procedure: DILATATION AND CURETTAGE /HYSTEROSCOPY WITH MYOSURE;  Surgeon: Lafonda Mosses, MD;  Location: [facility];  Service: Gynecology;  Laterality: N/A;   LAPAROTOMY  12/18/2020   Procedure: LAPAROTOMY;  Surgeon: Lafonda Mosses, MD;  Location: [facility];  Service: Gynecology;;   LOOP RECORDER INSERTION N/A 04/23/2020   Procedure: LOOP RECORDER INSERTION;  Surgeon: Evans Lance, MD;  Location: [place] [facility];  Service: Cardiovascular;  Laterality: N/A;   ROBOTIC ASSISTED TOTAL HYSTERECTOMY WITH BILATERAL SALPINGO OOPHERECTOMY Bilateral 12/18/2020   Procedure: XI ROBOTIC ASSISTED TOTAL HYSTERECTOMY WITH BILATERAL SALPINGO OOPHORECTOMY;  Surgeon: Lafonda Mosses, MD;  Location: [facility];  Service: Gynecology;  Laterality: Bilateral;   SENTINEL NODE BIOPSY N/A 12/18/2020   Procedure: SENTINEL NODE BIOPSY;  Surgeon: Lafonda Mosses, MD;  Location: [facility];  Service: Gynecology;  Laterality: N/A;   TEE WITHOUT CARDIOVERSION N/A 04/23/2020   Procedure: TRANSESOPHAGEAL ECHOCARDIOGRAM (TEE);  Surgeon: Larey Dresser, MD;  Location: [facility];  Service: Cardiovascular;  Laterality: N/A;   TONSILLECTOMY      Family  History  Problem Relation Age of Onset   Early death Father        suicide   Depression Father    Diabetes Father    Cancer Father        prostate   Hearing loss Father        due to war   Alzheimer's disease Mother    Heart disease Brother    Atrial fibrillation Brother    Heart disease Maternal Aunt    Dementia Maternal Aunt    Heart disease Maternal Uncle    Dementia Maternal Grandmother    Heart disease Brother    Atrial fibrillation Brother    Colon cancer Neg Hx    Breast cancer Neg Hx    Ovarian cancer Neg Hx    Pancreatic cancer Neg Hx    Endometrial cancer Neg Hx     Social History   Socioeconomic History   Marital status: Single    Spouse name: Not on file   Number of children: 1   Years of education: Not on file   Highest education level: Bachelor's degree (e.g., BA, AB, BS)  Occupational History   Occupation: retired   Tobacco Use   Smoking status: Former    Packs/day: 0.50    Years: 6.00    Pack years: 3.00    Types: Cigarettes    Quit date: 02/11/1974    Years since quitting: 46.9   Smokeless tobacco: Never  Vaping Use   Vaping Use: Never used  Substance and Sexual Activity   Alcohol use: Yes    Comment: rare   Drug use: Never   Sexual activity: Not Currently    Comment: Don't use not sexually active  Other Topics Concern   Not on file  Social History Narrative   Raised an adopted child on her own   Working part time reviewed documents   Social Determinants of Radio broadcast assistant Strain: Not on file  Food Insecurity: Not on file  Transportation Needs: Not on file  Physical Activity: Not on file  Stress: Not on file  Social Connections: Not on file    Current Medications:  Current Outpatient Medications:    atorvastatin (LIPITOR) 40 MG tablet, Take 1 tablet (40 mg total) by mouth daily., Disp: 90 tablet, Rfl: 1   buPROPion (WELLBUTRIN SR) 150 MG 12 hr tablet, Take 150 mg by mouth 2 (two) times daily., Disp: , Rfl:     citalopram (CELEXA) 40 MG tablet, TAKE 1 TABLET BY MOUTH EVERY DAY, Disp: 90 tablet, Rfl: 1   clindamycin-benzoyl peroxide (BENZACLIN) gel, Apply topically 2 (two) times daily. (Patient taking differently: Apply 1 application topically 2 (two) times daily as needed (facial acne).), Disp: 50 g, Rfl: 0   Cyanocobalamin (VITAMIN B-12) 1000 MCG SUBL, Place 1,000 mcg under the tongue at bedtime., Disp: , Rfl:    levothyroxine (SYNTHROID) 75 MCG tablet, TAKE 1 TABLET(75 MCG) BY MOUTH DAILY BEFORE BREAKFAST, Disp: 90 tablet, Rfl: 1   Multiple Vitamin (MULTIVITAMIN WITH MINERALS) TABS tablet, Take 1 tablet by mouth daily., Disp: , Rfl:    polyethylene glycol (MIRALAX / GLYCOLAX) 17 g packet, Take 17 g by mouth daily. Can use before surgery for constipation, Disp: 14 each, Rfl: 0   polyvinyl alcohol (LIQUIFILM TEARS) 1.4 % ophthalmic solution, Place 1 drop into both eyes daily as needed for dry eyes., Disp: , Rfl:    rivaroxaban (XARELTO) 20 MG TABS tablet, Take 1 tablet (20 mg total) by mouth daily with supper. Resume on 12/21/2020, Disp: 30 tablet, Rfl: 0   senna-docusate (SENOKOT-S) 8.6-50 MG tablet, Take 2 tablets by mouth at bedtime. For AFTER surgery, do not take if having diarrhea, Disp: 30 tablet, Rfl: 0   traMADol (ULTRAM) 50 MG tablet, Take 1 tablet (50 mg total) by mouth every 6 (six) hours as needed for severe pain. For AFTER surgery only, do not take and drive, Disp: 10 tablet, Rfl: 0   Ubrogepant (UBRELVY) 100 MG TABS, Take 100 mg by mouth daily as needed (migraines)., Disp: , Rfl:    Vitamin D, Ergocalciferol, (DRISDOL) 1.25 MG (50000 UNIT) CAPS capsule, TAKE 1 CAPSULE BY MOUTH EVERY 7 DAYS (Patient taking differently: Take 50,000 Units by mouth every Monday. TAKE 1 CAPSULE BY MOUTH EVERY 7 DAYS), Disp: 12 capsule, Rfl: 1  Review of Systems: Denies appetite changes, fevers, chills, fatigue, unexplained weight changes. Denies hearing loss, neck lumps or masses, mouth sores, ringing in ears or  voice changes. Denies cough or wheezing.  Denies shortness of breath. Denies chest pain or palpitations. Denies leg swelling. Denies abdominal distention, pain, blood in stools, constipation, diarrhea, nausea, vomiting, or early satiety. Denies pain with intercourse, dysuria, frequency, hematuria or incontinence. Denies hot flashes, pelvic pain, vaginal bleeding or vaginal discharge.   Denies joint pain, back pain or muscle pain/cramps. Denies itching, rash, or wounds. Denies dizziness, headaches, numbness or seizures. Denies swollen lymph nodes or glands, denies easy bruising or bleeding. Denies anxiety, depression, confusion, or decreased concentration.  Physical Exam: There were no vitals taken for this visit. General: ***Alert, oriented, no acute distress. HEENT: ***Posterior oropharynx clear, sclera anicteric. Chest: ***Clear to auscultation bilaterally.  ***Port site clean. Cardiovascular: ***Regular rate and rhythm, no murmurs. Abdomen: ***Obese, soft, nontender.  Normoactive bowel sounds.  No masses or hepatosplenomegaly appreciated.  ***Well-healed scar. Extremities: ***Grossly normal range of motion.  Warm, well perfused.  No edema bilaterally. Skin: ***No rashes or lesions noted. Lymphatics: ***No cervical, supraclavicular, or inguinal adenopathy. GU: Normal  appearing external genitalia without erythema, excoriation, or lesions.  Speculum exam reveals ***.  Bimanual exam reveals ***.  ***Rectovaginal exam  confirms ___.  Laboratory & Radiologic Studies: None new  Assessment & Plan: Natalie [name] is a 70 y.o. 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   Patient appears to be meeting postoperative milestones and doing well at home.  With regard to her vaginal bleeding, we discussed restarting Plavix but if she were to have any increase in bleeding, that I would like her to discontinue it until I can see her.   I have asked her to call on Monday to give [place] a status update on her bleeding.  If she were to start having more bleeding, we will plan to have her come into the clinic for an exam on Monday or Tuesday.  Leading precautions reviewed.   Reviewed in detail her pathology report.  Depth of invasion is somewhat difficult to discern given the way the uterus was sampled as well as adenomyosis.  Overall, she seems to have less than 50% myometrial invasion.  Lymph node assessment was not done in one hemipelvis.  We will plan to have her case presented at our next tumor board to discuss pathology details including the size of her tumor.  My overall impression from her report is that she has both early stage but also low risk disease and despite no nodal evaluation in 1 hemipelvis, that her recurrence risk is likely low enough that no adjuvant treatment is recommended.  *** minutes of total time was spent for this patient encounter, including preparation, face-to-face counseling with the patient and coordination of care, and documentation of the encounter.  Jeral Pinch, MD  Division of Gynecologic Oncology  Department of Obstetrics and Gynecology  [facility]

## 2021-01-07 ENCOUNTER — Telehealth: Payer: Medicare Other | Admitting: Gynecologic Oncology

## 2021-01-07 ENCOUNTER — Other Ambulatory Visit (HOSPITAL_COMMUNITY): Payer: Medicare Other

## 2021-01-07 ENCOUNTER — Encounter: Payer: Medicare Other | Admitting: Gynecologic Oncology

## 2021-01-07 ENCOUNTER — Other Ambulatory Visit: Payer: Self-pay | Admitting: Oncology

## 2021-01-07 ENCOUNTER — Encounter: Payer: Self-pay | Admitting: Gynecologic Oncology

## 2021-01-07 ENCOUNTER — Inpatient Hospital Stay: Payer: Medicare Other | Admitting: Gynecologic Oncology

## 2021-01-07 ENCOUNTER — Ambulatory Visit: Payer: Medicare Other | Admitting: Gynecologic Oncology

## 2021-01-07 ENCOUNTER — Telehealth: Payer: Self-pay | Admitting: *Deleted

## 2021-01-07 DIAGNOSIS — C541 Malignant neoplasm of endometrium: Secondary | ICD-10-CM

## 2021-01-07 NOTE — Progress Notes (Signed)
Gynecologic Oncology Multi-Disciplinary Disposition Conference Note  Date of the Conference: 01/07/2021  Patient Name: [name]  Referring Provider: Dr. Alvy Bimler Primary GYN Oncologist: Dr. Berline Lopes  Stage/Disposition:  Stage IA, grade 1 endometrioid endometrial adenocarcinoma. Disposition is to repeat CT scan followed by adjuvant therapy with vaginal brachytherapy.   This Multidisciplinary conference took place involving physicians from [place], [place], Radiation Oncology, Pathology, Radiology along with the Gynecologic Oncology Nurse Practitioner and RN.  Comprehensive assessment of the patient's malignancy, staging, need for surgery, chemotherapy, radiation therapy, and need for further testing were reviewed. Supportive measures, both inpatient and following discharge were also discussed. The recommended plan of care is documented. Greater than 35 minutes were spent correlating and coordinating this patient's care.

## 2021-01-07 NOTE — Addendum Note (Signed)
Addended by: Elmo Putt R on: 01/07/2021 11:35 AM   Modules accepted: Orders

## 2021-01-07 NOTE — Telephone Encounter (Signed)
Returned the patient's call and explained that per Dr Berline Lopes "If you have fallen and are blacking out, you need to go to the ER. I will call you later today to discuss the recommendations from tumor board, but I still need to see you in the clinic for a post op check." Patient stated "I didn't completely back out and it happens all the time. I don't want to go to the ER." Explained again Per Dr Berline Lopes the above recommendation per Dr Berline Lopes and reschedule her appt to next Monday. Patient verbalized understanding

## 2021-01-08 ENCOUNTER — Encounter: Payer: Self-pay | Admitting: Gynecologic Oncology

## 2021-01-08 ENCOUNTER — Telehealth: Payer: Self-pay | Admitting: Oncology

## 2021-01-08 ENCOUNTER — Ambulatory Visit: Payer: Medicare Other | Admitting: Physical Therapy

## 2021-01-08 ENCOUNTER — Encounter: Payer: Self-pay | Admitting: Family Medicine

## 2021-01-08 ENCOUNTER — Telehealth: Payer: Self-pay | Admitting: Gynecologic Oncology

## 2021-01-08 NOTE — Telephone Encounter (Signed)
Called Natalie [name] and advised her of CT scan appointment on 01/17/21 and consult with Dr. Sondra Come on 01/31/21.  Advised will will provide her with the contrast for her CT at her follow up appointment on Monday.  She verbalized understanding and agreement.

## 2021-01-08 NOTE — Telephone Encounter (Signed)
Called patient to discuss tumor board recs. No answer. Left VM.  Jeral Pinch MD Gynecologic Oncology

## 2021-01-09 ENCOUNTER — Other Ambulatory Visit: Payer: Self-pay | Admitting: Gynecologic Oncology

## 2021-01-09 DIAGNOSIS — R630 Anorexia: Secondary | ICD-10-CM

## 2021-01-09 NOTE — Progress Notes (Signed)
Duplicate

## 2021-01-10 ENCOUNTER — Telehealth: Payer: Self-pay | Admitting: Radiation Oncology

## 2021-01-10 ENCOUNTER — Ambulatory Visit: Payer: Medicare Other | Admitting: Physical Therapy

## 2021-01-10 NOTE — Telephone Encounter (Signed)
Scheduled per sch msg. Called and spoke with patient. Confirmed appt  

## 2021-01-14 ENCOUNTER — Inpatient Hospital Stay: Payer: Medicare Other | Attending: Hematology and Oncology | Admitting: Gynecologic Oncology

## 2021-01-14 ENCOUNTER — Other Ambulatory Visit: Payer: Self-pay

## 2021-01-14 ENCOUNTER — Encounter: Payer: Self-pay | Admitting: Gynecologic Oncology

## 2021-01-14 VITALS — BP 113/67 | HR 88 | Temp 98.1°F | Resp 16 | Ht 65.0 in | Wt 167.7 lb

## 2021-01-14 DIAGNOSIS — Z90710 Acquired absence of both cervix and uterus: Secondary | ICD-10-CM

## 2021-01-14 DIAGNOSIS — Z90722 Acquired absence of ovaries, bilateral: Secondary | ICD-10-CM

## 2021-01-14 DIAGNOSIS — Z7189 Other specified counseling: Secondary | ICD-10-CM

## 2021-01-14 DIAGNOSIS — R630 Anorexia: Secondary | ICD-10-CM

## 2021-01-14 DIAGNOSIS — C541 Malignant neoplasm of endometrium: Secondary | ICD-10-CM

## 2021-01-14 NOTE — Patient Instructions (Addendum)
It was good to see you today. You are healing well from surgery.  Remember, no lifting more than 10 pounds for 6 weeks and nothing in the vagina for at least 8 weeks. I will follow-up with you regarding results of your CT scan, once performed. You are scheduled to see Dr. Sondra Come later this month. Please either call or send a MyChart message after you meet with him. I will see you for a clinic follow-up visit after you complete radiation, what ever that ends up being.

## 2021-01-14 NOTE — Progress Notes (Signed)
Gynecologic Oncology Return Clinic Visit  01/14/21  Reason for Visit: follow-up after surgery, treatment planning  Treatment History: Oncology History Overview Note  MMR IHC normal   Endometrial cancer ([place])  04/2020 Imaging   CT A/P in 04/2020 showed enlarged fibroid uterus; complex cystic mass in left ovary measuring up to 5cm. Pelvic ultrasound 05/01/20: Enlarged multi-fibroid uterus, endometrium 3.78m. Left ovary measures up to 7cm with a 5.8 x 4.1 x 4cm dominant septated cyst.  CA-125   04/2020 Tumor Marker   Patient's tumor was tested for the following markers: CA-125. Results of the tumor marker test revealed 592.   08/10/2020 Imaging   Pelvic ultrasound showed thickened heterogenous endometrial complex with somewhat nodular margins associated with a 3.8 cm diameter fluid collection within the endometrial canal.  8.2 cm cyst of the left ovary.   08/2020 Tumor Marker   Patient's tumor was tested for the following markers: CA-125. Results of the tumor marker test revealed 731.   08/27/2020 Initial Biopsy   EMB showing minute fragments of benign inactive endometrium, mostly blood and fibrin   11/01/2020 Surgery   D&C hysteroscopy, and hysteroscopic sampling of the endometrium.    Final pathology showed at least complex atypical hyperplasia.   11/29/2020 Imaging   Pelvic ultrasound shows thickened endometrial lining measuring 4.4 cm.  Normal-appearing right ovary.  Complex left adnexal cyst measures up to f 5.3 cm.  No flow seen   12/18/2020 Surgery   TRH/BSO, SLN injection with biopsy on let (no mapping on right), mini-lap for specimen removal, repair vaginal lacerations  Findings: On EUA, large globular uterus. On intra-abdominal entry, normal upper abdominal survey.Normal omentum, small and large bowel. Uterus 10cm, very globular and bulbous at the fundus. Multiple fibroids including 3cm lower uterine segment anterior fibroid and posterior 6cm fundal fibroid. Right adnexa normal  and atrophic. Mapping to channels along the right broad ligament, no obvious SLN. On the left, mapping successful to external iliac SLN. Left ovary replaced by 6cm cyst adherent to the broad ligament and sigmoid mesentery with old blood versus necrotic tissue within (cyst wall very easily ripped open with minimal manipulation of the adnexa. Second 2cm cystic component. No obvious findings of malignancy but would be stage II if cancer given adherence to surrounding pelvic structures. Some adhesions between the bladder and cervix. No obvious adenopathy. No ascites. Mini-lap required for specimen removal given size.  No frozen sent as it was not going to impact management. Goal was to keep the surgery under or close to 2 hours.   12/18/2020 Pathology Results   A. LYMPH NODE, LEFT EXTERNAL ILIAC SENTINAL, BIOPSY:  - One lymph node, negative for malignancy (0/1).   B. UTERUS, CERVIX,  BILATERAL TUBES, HYSTERECTOMY AND BILATERAL  SALPINGECTOMY:  - Endometrium:       - Endometrioid endometrial adenocarcinoma, low-grade, arising in a  background of endometrioid intraepithelial neoplasia (EIN), with  extensive colonization of adenomyosis.       - See oncology table.  - Myometrium:       - Adenomyosis, involved by endometrioid endometrial adenocarcinoma.       - Leiomyomata uteri.  - Uterine cervix:       - Benign transformation zone.       - Negative for squamous intraepithelial lesion and malignancy.  - Fallopian tubes:       - No significant histopathologic change.  - Ovaries:       - Benign physiologic changes.   COMMENT:   A. An immunohistochemical  study for pancytokeratin is negative. There is  no evidence of metastatic carcinoma.   B. Immunohistochemical studies show the tumor cells to be positive for  PR, and negative for Napsin A.  These findings would favor the above  diagnosis, and suggest against clear cell carcinoma. The definite extent  of myometrial invasion is less than 50%,  but cannot be further  characterized, due to initial sampling of the entire myometrial surface,  and extensive colonization of adenomyosis throughout the myometrium  impacting evaluation of the orientation of these fragments. Full  thickness sections are without definite direct myometrial invasion, but  show extensive adenomyosis   UTERUS, CARCINOMA OR CARCINOSARCOMA: Resection   Procedure: Total hysterectomy with bilateral salpingo-oophorectomy  Histologic Type: Endometrioid carcinoma, NOS  Histologic Grade: Low-grade  Myometrial Invasion:       Cannot be determined  Uterine Serosa Involvement: Not identified  Cervical stromal Involvement: Not identified  Extent of involvement of other tissue/organs: Not identified  Peritoneal/Ascitic Fluid: Not submitted/unknown  Lymphovascular Invasion: Not identified  Regional Lymph Nodes: Regional lymph nodes present  All regional lymph nodes negative for tumor cells       Pelvic Lymph Nodes Examined:  Sentinel: 1               Non-sentinel: 0               Total: 1       Pelvic Lymph Nodes with Metastasis: 0                          Macrometastasis: (>2.0 mm): 0                          Micrometastasis: (>0.2 mm and < 2.0 mm): 0                          Isolated Tumor Cells (<0.2 mm): 0                          Laterality of Lymph Node with Tumor: Not  applicable                             Extracapsular Extension: Not applicable    78/46/9629 Initial Diagnosis   Endometrial cancer ([facility])     Interval History: Patient had surgery on 12/15.  She notes overall doing well.  Denies any significant abdominal or pelvic pain.  Has not had any vaginal bleeding since we last talked and since restarting Plavix.  Reports bowels and bladder working well.  Denies any fevers or chills.  Since starting Ensure, she denies any further episodes of being close to "blacking out".  CT scheduled 12/15.  Past Medical/Surgical History: Past Medical  History:  Diagnosis Date   Acute bronchospasm due to viral infection 2020   Due to pollen   Anemia    history of   CVA (cerebral vascular accident) ([place]) 02/28/2020   and 04/19/20   Depressive disorder    Dyspnea    Dysrhythmia    Endometrial cancer ([place]) 12/26/2020   Epistaxis    Fatigue    Hearing loss    History of loop recorder    Hypothyroidism    Memory change    Migraine    Obesity (BMI 30.0-34.9)    lost 60 lbs  Osteoarthritis    Osteoporosis    Polyuria    Premature menopause    Seizures ([place]) 1994   history of mini seizures, possible migraine induced    Past Surgical History:  Procedure Laterality Date   ABLATION  2006   BUBBLE STUDY  04/23/2020   Procedure: BUBBLE STUDY;  Surgeon: Larey Dresser, MD;  Location: [facility] ENDOSCOPY;  Service: Cardiovascular;;   CATARACT EXTRACTION W/ INTRAOCULAR LENS IMPLANT Bilateral    COLONOSCOPY     COLONOSCOPY WITH PROPOFOL N/A 12/22/2018   Procedure: COLONOSCOPY WITH PROPOFOL;  Surgeon: Virgel Manifold, MD;  Location: [facility] ENDOSCOPY;  Service: Endoscopy;  Laterality: N/A;   EYE SURGERY  Spring 2018   Cataracts both eyes   HYSTEROSCOPY WITH D & C N/A 11/01/2020   Procedure: DILATATION AND CURETTAGE /HYSTEROSCOPY WITH MYOSURE;  Surgeon: Lafonda Mosses, MD;  Location: [facility] ORS;  Service: Gynecology;  Laterality: N/A;   LAPAROTOMY  12/18/2020   Procedure: LAPAROTOMY;  Surgeon: Lafonda Mosses, MD;  Location: [facility] ORS;  Service: Gynecology;;   LOOP RECORDER INSERTION N/A 04/23/2020   Procedure: LOOP RECORDER INSERTION;  Surgeon: Evans Lance, MD;  Location: [facility];  Service: Cardiovascular;  Laterality: N/A;   ROBOTIC ASSISTED TOTAL HYSTERECTOMY WITH BILATERAL SALPINGO OOPHERECTOMY Bilateral 12/18/2020   Procedure: XI ROBOTIC ASSISTED TOTAL HYSTERECTOMY WITH BILATERAL SALPINGO OOPHORECTOMY;  Surgeon: Lafonda Mosses, MD;  Location: [facility] ORS;  Service: Gynecology;  Laterality: Bilateral;   SENTINEL NODE  BIOPSY N/A 12/18/2020   Procedure: SENTINEL NODE BIOPSY;  Surgeon: Lafonda Mosses, MD;  Location: [facility] ORS;  Service: Gynecology;  Laterality: N/A;   TEE WITHOUT CARDIOVERSION N/A 04/23/2020   Procedure: TRANSESOPHAGEAL ECHOCARDIOGRAM (TEE);  Surgeon: Larey Dresser, MD;  Location: [facility] ENDOSCOPY;  Service: Cardiovascular;  Laterality: N/A;   TONSILLECTOMY      Family History  Problem Relation Age of Onset   Early death Father        suicide   Depression Father    Diabetes Father    Cancer Father        prostate   Hearing loss Father        due to war   Alzheimer's disease Mother    Heart disease Brother    Atrial fibrillation Brother    Heart disease Maternal Aunt    Dementia Maternal Aunt    Heart disease Maternal Uncle    Dementia Maternal Grandmother    Heart disease Brother    Atrial fibrillation Brother    Colon cancer Neg Hx    Breast cancer Neg Hx    Ovarian cancer Neg Hx    Pancreatic cancer Neg Hx    Endometrial cancer Neg Hx     Social History   Socioeconomic History   Marital status: Single    Spouse name: Not on file   Number of children: 1   Years of education: Not on file   Highest education level: Bachelor's degree (e.g., BA, AB, BS)  Occupational History   Occupation: retired   Tobacco Use   Smoking status: Former    Packs/day: 0.50    Years: 6.00    Pack years: 3.00    Types: Cigarettes    Quit date: 02/11/1974    Years since quitting: 46.9   Smokeless tobacco: Never  Vaping Use   Vaping Use: Never used  Substance and Sexual Activity   Alcohol use: Yes    Comment: rare   Drug use: Never  Sexual activity: Not Currently    Comment: Don't use not sexually active  Other Topics Concern   Not on file  Social History Narrative   Raised an adopted child on her own   Working part time reviewed documents   Social Determinants of Radio broadcast assistant Strain: Not on file  Food Insecurity: Not on file  Transportation Needs: Not on  file  Physical Activity: Not on file  Stress: Not on file  Social Connections: Not on file    Current Medications:  Current Outpatient Medications:    atorvastatin (LIPITOR) 40 MG tablet, Take 1 tablet (40 mg total) by mouth daily., Disp: 90 tablet, Rfl: 1   buPROPion (WELLBUTRIN SR) 150 MG 12 hr tablet, Take 150 mg by mouth 2 (two) times daily., Disp: , Rfl:    citalopram (CELEXA) 40 MG tablet, TAKE 1 TABLET BY MOUTH EVERY DAY, Disp: 90 tablet, Rfl: 1   clindamycin-benzoyl peroxide (BENZACLIN) gel, Apply topically 2 (two) times daily. (Patient taking differently: Apply 1 application topically 2 (two) times daily as needed (facial acne).), Disp: 50 g, Rfl: 0   clopidogrel (PLAVIX) 75 MG tablet, Take 75 mg by mouth daily., Disp: , Rfl:    Cyanocobalamin (VITAMIN B-12) 1000 MCG SUBL, Place 1,000 mcg under the tongue at bedtime., Disp: , Rfl:    levothyroxine (SYNTHROID) 75 MCG tablet, TAKE 1 TABLET(75 MCG) BY MOUTH DAILY BEFORE BREAKFAST, Disp: 90 tablet, Rfl: 1   Multiple Vitamin (MULTIVITAMIN WITH MINERALS) TABS tablet, Take 1 tablet by mouth daily., Disp: , Rfl:    polyvinyl alcohol (LIQUIFILM TEARS) 1.4 % ophthalmic solution, Place 1 drop into both eyes daily as needed for dry eyes., Disp: , Rfl:    rivaroxaban (XARELTO) 20 MG TABS tablet, Take 1 tablet (20 mg total) by mouth daily with supper. Resume on 12/21/2020, Disp: 30 tablet, Rfl: 0   Ubrogepant (UBRELVY) 100 MG TABS, Take 100 mg by mouth daily as needed (migraines)., Disp: , Rfl:    Vitamin D, Ergocalciferol, (DRISDOL) 1.25 MG (50000 UNIT) CAPS capsule, TAKE 1 CAPSULE BY MOUTH EVERY 7 DAYS (Patient taking differently: Take 50,000 Units by mouth every Monday. TAKE 1 CAPSULE BY MOUTH EVERY 7 DAYS), Disp: 12 capsule, Rfl: 1  Review of Systems: Denies appetite changes, fevers, chills, fatigue, unexplained weight changes. Denies hearing loss, neck lumps or masses, mouth sores, ringing in ears or voice changes. Denies cough or wheezing.   Denies shortness of breath. Denies chest pain or palpitations. Denies leg swelling. Denies abdominal distention, pain, blood in stools, constipation, diarrhea, nausea, vomiting, or early satiety. Denies pain with intercourse, dysuria, frequency, hematuria or incontinence. Denies hot flashes, pelvic pain, vaginal bleeding or vaginal discharge.   Denies joint pain, back pain or muscle pain/cramps. Denies itching, rash, or wounds. Denies dizziness, headaches, numbness or seizures. Denies swollen lymph nodes or glands, denies easy bruising or bleeding. Denies anxiety, depression, confusion, or decreased concentration.  Physical Exam: BP 113/67 (BP Location: Right Arm, Patient Position: Sitting)   Pulse 88   Temp 98.1 F (36.7 C) (Oral)   Resp 16   Ht 5' 5" (1.651 m)   Wt 167 lb 11.2 oz (76.1 kg)   SpO2 99%   BMI 27.91 kg/m  General: Alert, oriented, no acute distress. HEENT: Normocephalic, atraumatic, sclera anicteric. Chest: Clear to auscultation bilaterally.  No wheezes. Cardiovascular: Regular rate and rhythm, no murmurs. Abdomen: soft, nontender.  Normoactive bowel sounds.  No masses or hepatosplenomegaly appreciated. Well-healed incisions. Extremities: Grossly normal  range of motion.  Warm, well perfused.  No edema bilaterally. Skin: Ecchymosis in an area of approximately 10-12cm along upper right posterior leg and buttock. GU: Normal appearing external genitalia without erythema, excoriation, or lesions.  Speculum exam reveals cuff intact, suture still visible.  Suture along the right sidewall and posterior vagina.  No bleeding or discharge. On bimanual exam, cuff intact, no tenderness or fluctuance.  Laboratory & Radiologic Studies: None new  Assessment & Plan: Natalie [name] is a 70 y.o. woman with Stage 1A grade 1 endometrioid endometrial adenocarcinoma who presents for inperson follow-up after surgery, treatment discussion.   Doing well, meeting milestones. Discussed  continued restrictions and post-operative expectations.   Po intake has improved, no further near syncopal episodes. Sees dietician tomorrow.   Reviewed in detail her pathology report.  Discussed again difficulty in determining invasion (estimated at <50%) and size of tumor given adenomyosis. No LVI. Given large area of myometrium involved, tumor size suspected to be larger. This is important because she did not have lymphatic mapping on one side and we had decided prior to surgery that full LND would be deferred in an effort to keep surgery short and morbidity lower given her comorbidities.   At our tumor board discussion, Dr. Sondra Come and I agreed that she would benefit from some adjuvant therapy. While there is an argument for pelvic radiation (no nodal assessment on the right and larger tumor), I think that given other pathology features (including no LVI) that vaginal brachytherapy would serve purpose of decreasing risk of recurrence without adding a significant amount of toxicity. The patient is meeting with Dr. Sondra Come at the end of the month. She understands that VBT would need to happen here at [facility]. If she opted for pelvic radiation, that could happen in [place] (closer to home).   She will message me after she meets with Dr. Sondra Come.  42 minutes of total time was spent for this patient encounter, including preparation, face-to-face counseling with the patient and coordination of care, and documentation of the encounter.  Jeral Pinch, MD  Division of Gynecologic Oncology  Department of Obstetrics and Gynecology  [facility]

## 2021-01-15 ENCOUNTER — Encounter: Payer: Self-pay | Admitting: Nutrition

## 2021-01-15 ENCOUNTER — Ambulatory Visit: Payer: Medicare Other | Admitting: Physical Therapy

## 2021-01-15 ENCOUNTER — Inpatient Hospital Stay: Payer: Medicare Other | Admitting: Nutrition

## 2021-01-15 NOTE — Progress Notes (Signed)
Patient did not show up for nutrition appointment. 

## 2021-01-17 ENCOUNTER — Ambulatory Visit: Payer: Medicare Other | Admitting: Physical Therapy

## 2021-01-17 ENCOUNTER — Ambulatory Visit (HOSPITAL_COMMUNITY)
Admission: RE | Admit: 2021-01-17 | Discharge: 2021-01-17 | Disposition: A | Discharge status: Home / Self Care | Payer: Medicare Other | Source: Ambulatory Visit | Attending: Gynecologic Oncology | Admitting: Gynecologic Oncology

## 2021-01-17 DIAGNOSIS — C541 Malignant neoplasm of endometrium: Secondary | ICD-10-CM | POA: Insufficient documentation

## 2021-01-17 DIAGNOSIS — N8502 Endometrial intraepithelial neoplasia [EIN]: Secondary | ICD-10-CM

## 2021-01-17 DIAGNOSIS — K8020 Calculus of gallbladder without cholecystitis without obstruction: Secondary | ICD-10-CM | POA: Diagnosis not present

## 2021-01-17 DIAGNOSIS — K5730 Diverticulosis of large intestine without perforation or abscess without bleeding: Secondary | ICD-10-CM | POA: Diagnosis not present

## 2021-01-17 DIAGNOSIS — Z90710 Acquired absence of both cervix and uterus: Secondary | ICD-10-CM | POA: Diagnosis not present

## 2021-01-17 DIAGNOSIS — N838 Other noninflammatory disorders of ovary, fallopian tube and broad ligament: Secondary | ICD-10-CM

## 2021-01-17 IMAGING — CT CT ABD-PELV W/ CM
3 of 6 series · 16 of 46 positions shown, 18 images · IV contrast (APPLIED)
Comparison: [DATE]

CLINICAL DATA: Endometrial carcinoma. Recent hysterectomy. Staging.

EXAM:
CT ABDOMEN AND PELVIS WITH CONTRAST
TECHNIQUE: Multidetector CT imaging of the abdomen and pelvis was performed
using the standard protocol following bolus administration of
intravenous contrast.
CONTRAST:  80mL OMNIPAQUE IOHEXOL 350 MG/ML SOLN

[Series 2: axial st · axial · 0.80mm/px · z∈[-851,-461]mm · 11 of 94 slices shown, 13 images]
[im 8/94  soft-tissue]
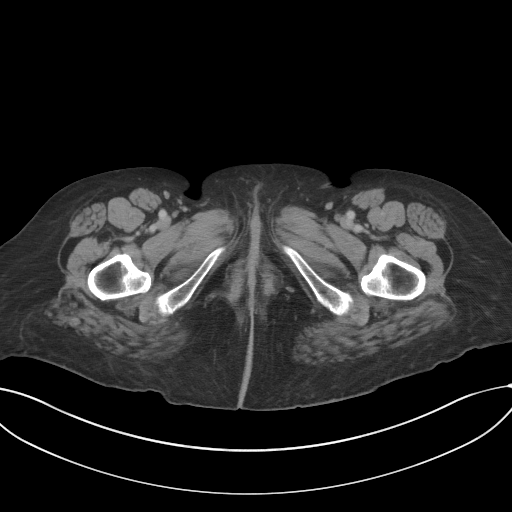
[im 8/94  bone]
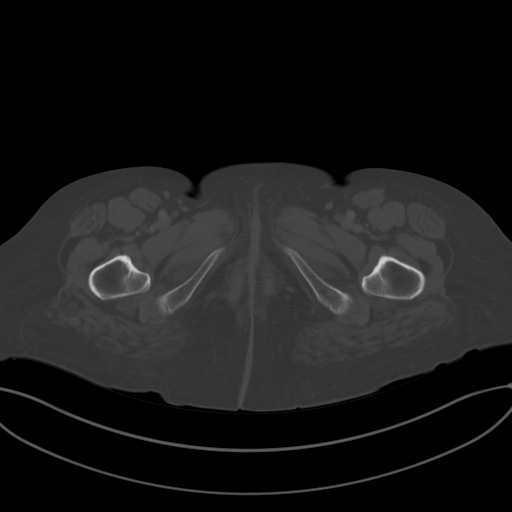
[im 16/94  soft-tissue]
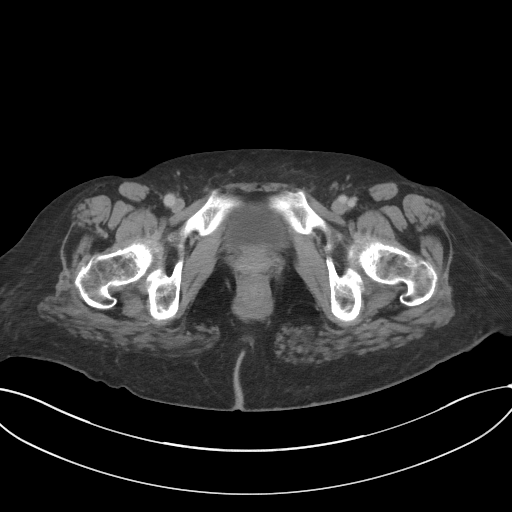
[im 24/94  soft-tissue]
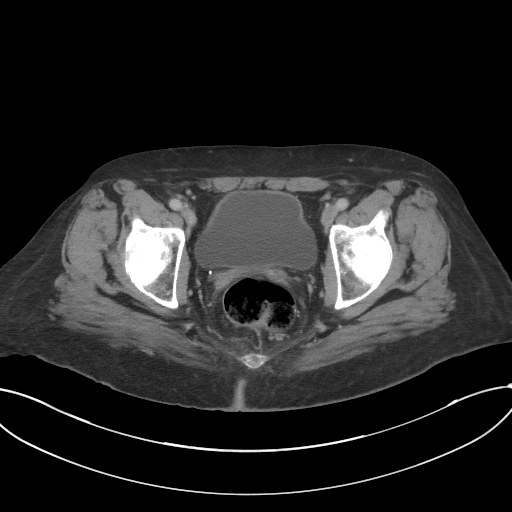
[im 32/94  soft-tissue]
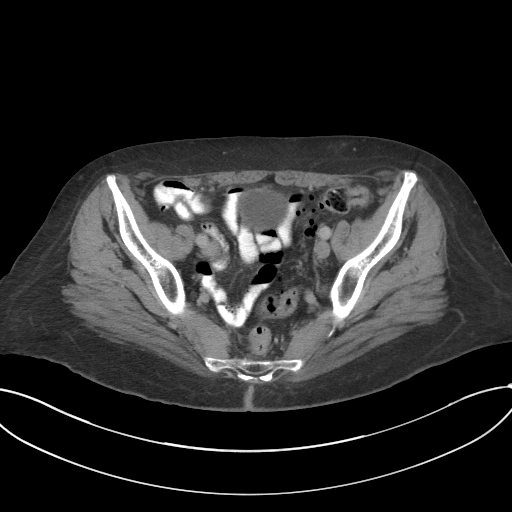
[im 39/94  soft-tissue]
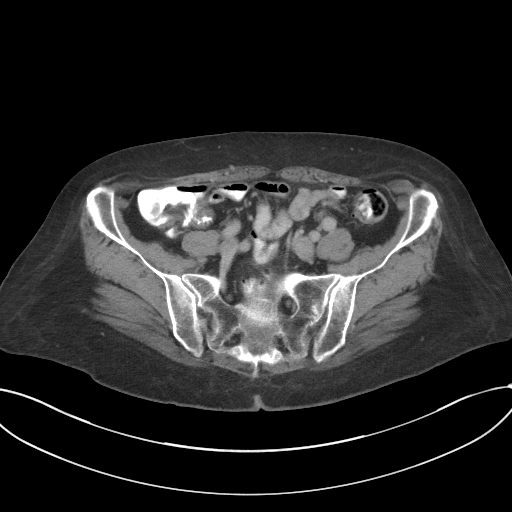
[im 47/94  soft-tissue]
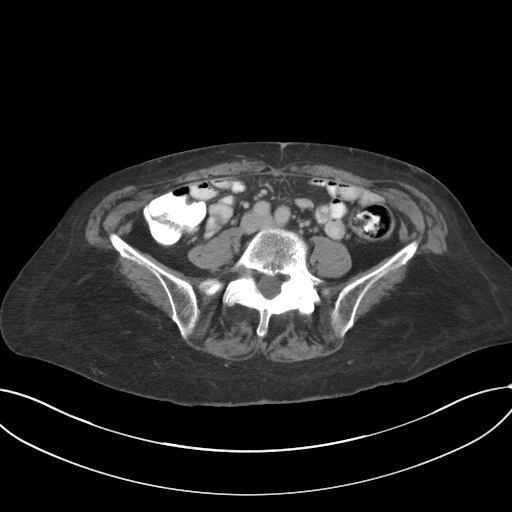
[im 55/94  soft-tissue]
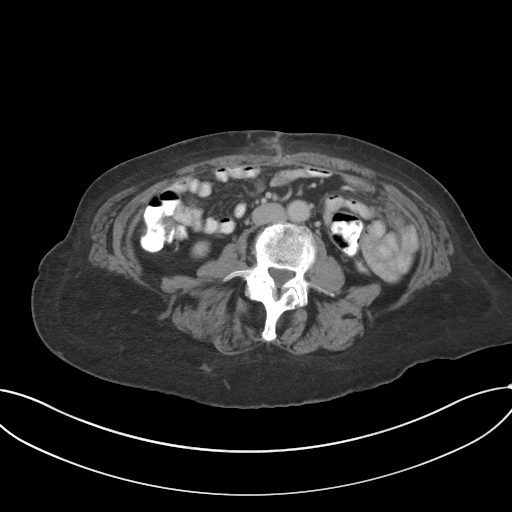
[im 63/94  soft-tissue]
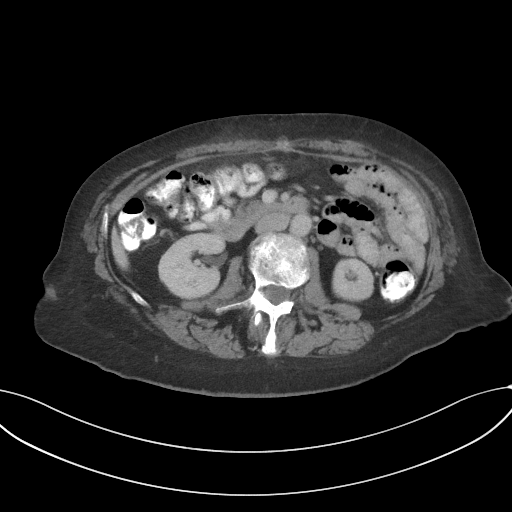
[im 70/94  soft-tissue]
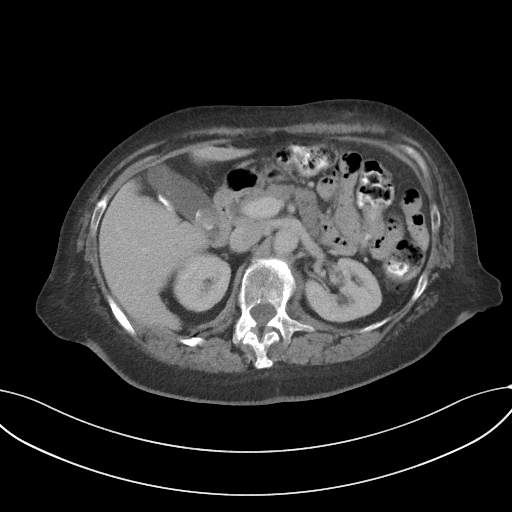
[im 70/94  bone]
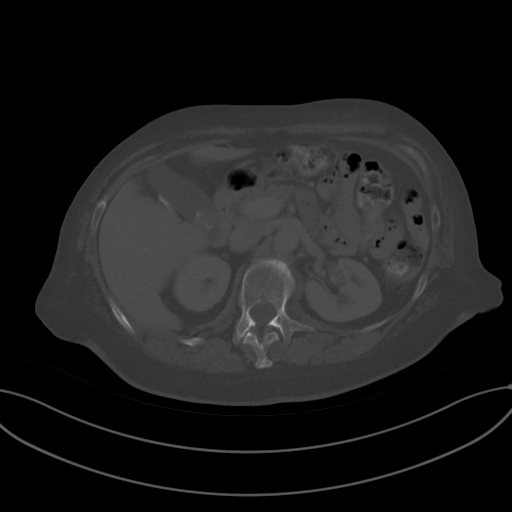
[im 78/94  soft-tissue]
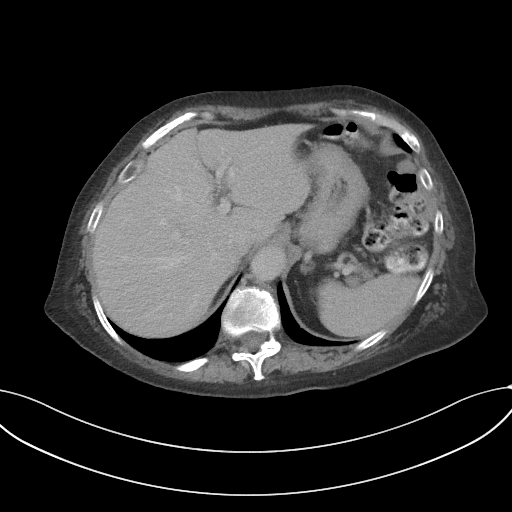
[im 86/94  soft-tissue]
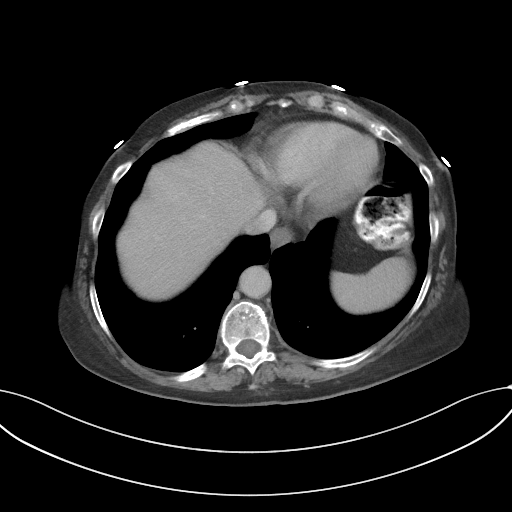

[Series 4: lung bases · axial · 0.64mm/px · z∈[-545,-527]mm · 2 of 71 slices shown]
[im 9/71  bone]
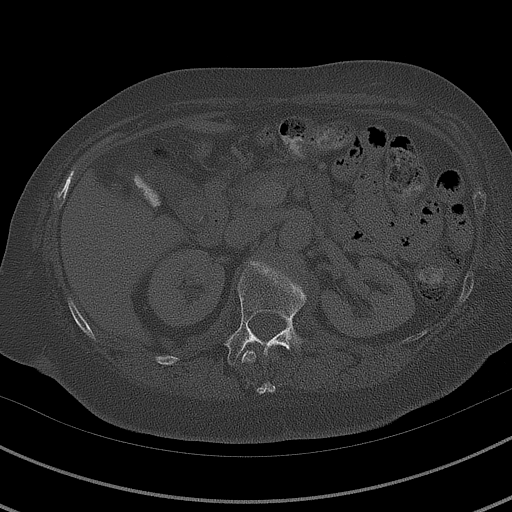
[im 18/71  bone]
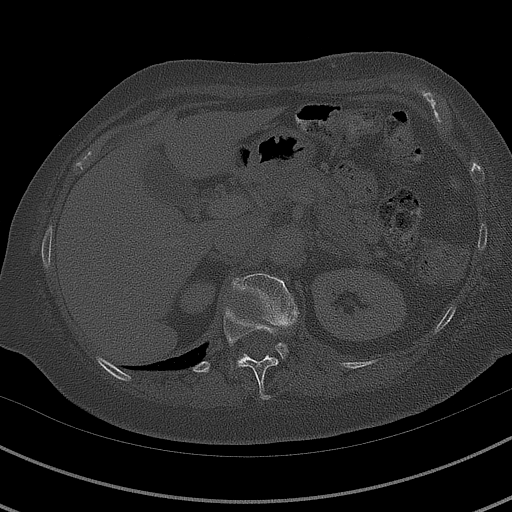

[Series 5: coronal st · coronal · 0.68mm/px · 3 of 87 slices shown]
[im 29/87  soft-tissue]
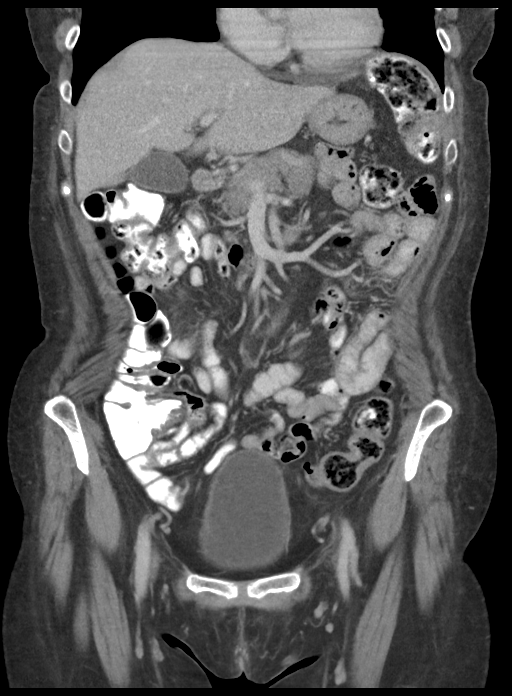
[im 39/87  soft-tissue]
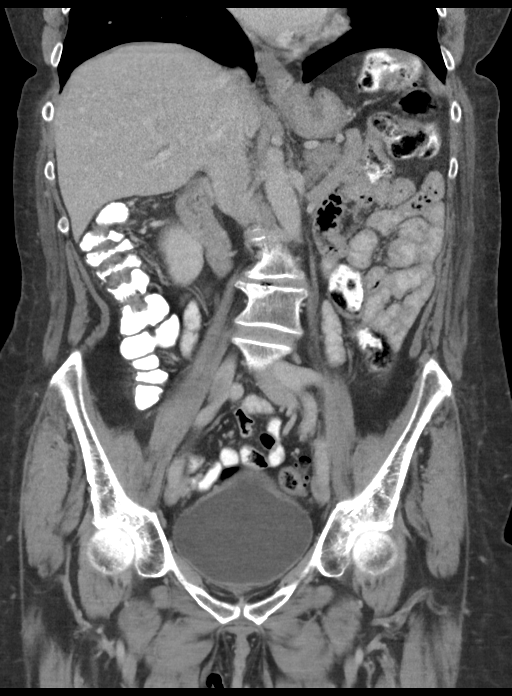
[im 48/87  soft-tissue]
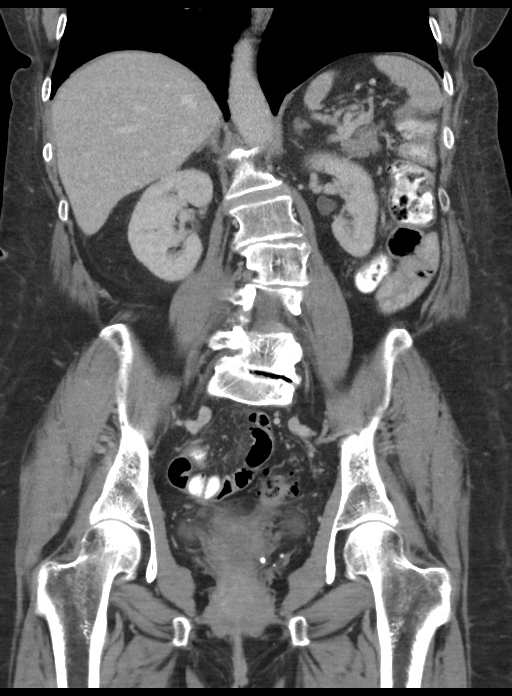

[16 of 46 positions shown; findings below may reference images not displayed]

FINDINGS: Lower Chest: No acute findings.

Hepatobiliary: No hepatic masses identified. 1.8 cm calcified
gallstone again noted, however there is no evidence of cholecystitis
or biliary ductal dilatation.

Pancreas:  No mass or inflammatory changes.

Spleen: Within normal limits in size and appearance.

Adrenals/Urinary Tract: No masses identified. No evidence of
ureteral calculi or hydronephrosis.

Stomach/Bowel: No evidence of obstruction, inflammatory process or
abnormal fluid collections. Normal appendix visualized.
Diverticulosis is seen mainly involving the sigmoid colon, however
there is no evidence of diverticulitis.

Vascular/Lymphatic: No pathologically enlarged lymph nodes. No acute
vascular findings.

Reproductive: Prior hysterectomy noted. Adnexal regions are
unremarkable in appearance.

Other:  None.

Musculoskeletal:  No suspicious bone lesions identified.
IMPRESSION: Prior hysterectomy. No acute findings. No evidence of metastatic
disease within the abdomen or pelvis.

Cholelithiasis. No radiographic evidence of cholecystitis.

Colonic diverticulosis. No radiographic evidence of diverticulitis.

## 2021-01-17 MED ORDER — IOHEXOL 9 MG/ML PO SOLN
ORAL | Status: AC
  Filled 2021-01-17: qty 1000

## 2021-01-17 MED ORDER — IOHEXOL 350 MG/ML SOLN
80.0000 mL | Freq: Once | INTRAVENOUS | Status: AC | PRN
  Administered 2021-01-17: 80 mL via INTRAVENOUS

## 2021-01-17 MED ORDER — SODIUM CHLORIDE (PF) 0.9 % IJ SOLN
INTRAMUSCULAR | Status: AC
  Filled 2021-01-17: qty 50

## 2021-01-17 MED ORDER — IOHEXOL 9 MG/ML PO SOLN: 500.0000 mL | ORAL | Status: AC

## 2021-01-21 NOTE — Progress Notes (Signed)
GYN Location of Tumor / Histology: Endometrial  Natalie [name] presented with symptoms of: During more recent hospitalization for stroke, patient underwent CT of the abdomen and pelvis showing an adnexal mass.  Pelvic ultrasound was recommended for follow-up.  Biopsies revealed:  A. LYMPH NODE, LEFT EXTERNAL ILIAC SENTINAL, BIOPSY:  - One lymph node, negative for malignancy (0/1).   B. UTERUS, CERVIX,  BILATERAL TUBES, HYSTERECTOMY AND BILATERAL  SALPINGECTOMY:  - Endometrium:       - Endometrioid endometrial adenocarcinoma, low-grade, arising in a  background of endometrioid intraepithelial neoplasia (EIN), with  extensive colonization of adenomyosis.       - See oncology table.  - Myometrium:       - Adenomyosis, involved by endometrioid endometrial adenocarcinoma.       - Leiomyomata uteri.  - Uterine cervix:       - Benign transformation zone.       - Negative for squamous intraepithelial lesion and malignancy.  - Fallopian tubes:       - No significant histopathologic change.  - Ovaries:       - Benign physiologic changes.   A. ENDOMETRIUM, CURETTAGE:  - Atypical hyperplasia   Past/Anticipated interventions by Gyn/Onc surgery, if any:  11/01/2020 Surgery: Dilation, hysteroscopy, and hysteroscopic curettage Surgeons:  Valarie Cones MD  12/18/2020 Operation: Robotic-assisted laparoscopic total hysterectomy with bilateral salpingoophorectomy, SLN injection and SLN biopsy on the left, mini-lap for specimen removal, repair of vaginal lacerations Surgeon: Jeral Pinch MD   Past/Anticipated interventions by medical oncology, if any: not at this time  Weight changes, if any: no  Bowel/Bladder complaints, if any: Yes.  , urinary frequency, urgency, incontinence, nocturia x1, incomplete bladder emptying  Nausea/Vomiting, if any: no  Pain issues, if any:  no  SAFETY ISSUES: Prior radiation? no Pacemaker/ICD? Has a loop recorder in left chest Possible current  pregnancy? no, hysterectomy Is the patient on methotrexate? no  Current Complaints / other details:  none  Vitals:   01/31/21 1253  BP: 119/81  Pulse: 91  Resp: 18  Temp: 98 F (36.7 C)  SpO2: 95%  Weight: 168 lb (76.2 kg)  Height: 5\' 5"  (1.651 m)

## 2021-01-22 ENCOUNTER — Ambulatory Visit: Payer: Medicare Other | Admitting: Physical Therapy

## 2021-01-24 ENCOUNTER — Ambulatory Visit: Payer: Medicare Other

## 2021-01-24 ENCOUNTER — Inpatient Hospital Stay: Payer: Medicare Other | Admitting: Nutrition

## 2021-01-24 ENCOUNTER — Other Ambulatory Visit: Payer: Self-pay

## 2021-01-24 NOTE — Progress Notes (Signed)
70 year old female diagnosed with endometrial cancer followed by Dr. Berline Lopes and Dr. Sondra Come.  Plan is for radiation therapy.  Past medical history includes anemia, CVA, depressive disorder, fatigue, hearing loss, and seizures.  Medications include Wellbutrin, vitamin B12, Synthroid, multivitamin, and vitamin D.  Labs include glucose 102 on November 17.  Height: 5 feet 5 inches. Weight: 178.8 pounds however she was in a heavy sweater and heavy boots.  States she weighed 168.6 pounds at home. Patient weighed 200 pounds in April 2022. BMI: 29.75.  Patient feels like she is currently overeating.  She states that at one time she did not have an appetite and forgot to eat.  She is very aware of calories and protein she is consuming.  She generally has eggs or cereal with fruit and milk for breakfast.  For lunch she eats cottage cheese and fruit or soup and a sandwich.  Dinner varies.  Reports drinking a half of original Ensure daily.  She occasionally will consume a ginger beer or Kahla. (One time month)  Nutrition diagnosis: Food and nutrition related knowledge deficit related to endometrial cancer and associated treatments as evidenced by no prior need for nutrition related information.  Intervention: Educated to consume smaller amounts of food more often.  Encouraged healthy plant-based diet with adequate protein. Stressed importance of weight maintenance during treatment. Provided nutrition fact sheets. Recommend change oral nutrition supplements to Ensure max or equivalent to provide 150 cal and 30 g of protein.  Provided samples. Nutrition fact sheets provided.  Questions answered.  Teach back method used.  Contact information given.  Monitoring, evaluation, goals: Patient will tolerate adequate calories and protein for weight maintenance.  No follow-up scheduled.  **Disclaimer: This note was dictated with voice recognition software. Similar sounding words can inadvertently be  transcribed and this note may contain transcription errors which may not have been corrected upon publication of note.**

## 2021-01-28 ENCOUNTER — Institutional Professional Consult (permissible substitution): Payer: Medicare Other | Admitting: Radiation Oncology

## 2021-01-30 NOTE — Progress Notes (Signed)
Radiation Oncology         [phone] ________________________________  Initial Outpatient Consultation  Name: Natalie [name] MRN: [id number]  Date: 01/31/2021  DOB: Jan 09, 1951  XI:HWTUUE, Natalie Stager, MD  Natalie Mosses, MD   REFERRING PHYSICIAN: Lafonda Mosses, MD  DIAGNOSIS: The encounter diagnosis was Endometrial cancer [facility]).   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 female who is accompanied by no one. she is seen as a courtesy of Dr. Berline [name] for an opinion concerning radiation therapy as part of management for her recently diagnosed endometrial cancer.   The patient presented to the ED on 04/19/20 and was admitted to due CVA. CT of the abdomen and pelvis performed during hospital course on 04/23/20 to rule out splenomegaly incidentally revealed an enlarged fibroid uterus, and a complex septated cystic mass in the left ovary measuring up to 5 cm.    Pelvic US on 05/01/20 again demonstrated the enlarged lobulated uterus with multiple fibroids, as well as the 5.8 cm dominant septated cyst in the left adnexa.   The patient then followed up with Dr. Alvy [name] on 05/02/20 who referred the patient to Dr. Berline [name] on 05/10/20 for surgical consultation. During this visit, the patient reported a decrease in appetite, 2 years of urinary frequency, urinary urgency, incontinence, and abdominal pressure when voiding. She denied any vaginal bleeding or discharge since menopause. Following evaluation, Dr. Berline [name] recommended observation for 3 months which Dr. Alvy [name] was in agreement to.   Follow up pelvic US on 08/10/20 revealed: a thickened heterogeneous endometrial complex with somewhat nodular margins, associated with a 3.8 cm diameter fluid collection within the endometrial canal (noted as likely blood products). Cervical stenosis or endometrial malignancy could not be excluded in respects to this. The cyst in  the left ovary was also appreciated, measuring 8.2 cm (previously 5-5.5 cm).   Accordingly, the patient followed up with Dr. Berline [name] on 08/13/20 to review recent imaging. During this visit, the patient reported having an aching sensation in her abdomen for the past 2 weeks. The patient reported this aching to sometimes radiate near her vaginal area, and to the left and right pelvic areas. The patient also noted her pain to be constant initially, and becoming intermittent over time. Given recent increased in size of her cyst and new symptoms, Dr. Berline [name] reccommended tissue biopsy to rule out hyperplasia or malignancy.   Endometrial biopsy on 08/27/20 revealed predominantly blood and fibrin, and minute fragments of benign inactive endometrium.   During follow up with Dr. Berline [name] on 10/05/20, the patient was noted to have had significant bleeding after her endometrial biopsy on 08/27/20. Her bleeding slowed significantly after starting provera. Since her last provera dose, her bleeding became a little heavier and she reported passing clots. Given the patient's recent imaging which raised concern of a malignant process, and her ongoing symptoms, Dr. Berline [name] recommended an additional biopsy.   Endometrial curettage performed on 11/01/20 revealed atypical hyperplasia.   During follow up with Dr. Berline [name] on 11/21/20, the patient reported continued episodes of heavier bleeding, which Dr. Berline [name] attributed to her hyperplasia in combination with being on both Plavix and a blood thinner. Given this, Dr. Berline [name] suggested increasing her prednisone dose. In the setting of atypical hyperplasia, Dr. Berline [name] recommended total hysterectomy and BSO.   Pelvic US on 11/29/20 showed thickened endometrial lining measuring 4.4 cm, and the normal appearance of the right ovary.  The complex left adnexal cyst  was also appreciated to measure up to f 5.3 cm.   The patient proceeded to undergo total hysterectomy, BSO and nodal biopsies  on 12/18/20 under the care of Dr. Berline [name]. Pathology from the procedure revealed:  --Endometrium: low-grade endometrioid endometrial adenocarcinoma, arising in a background of endometrioid intraepithelial neoplasia (EIN), with  extensive colonization of adenomyosis.  --Myometrium: adenomyosis, involved by endometrioid endometrial adenocarcinoma, and leiomyomata uteri. --cervix: negative for squamous intraepithelial lesion and malignancy. --fallopian tubes: no significant histopathologic change  --ovaries: benign changes  --left external iliac sentinel lymph node: negative for malignancy --MMR testing: MSI stable   From a post surgical standpoint, the patient was noted to be doing well during a follow up visit with Dr. Berline [name] on 12/26/20 other than mild fatigue and urinary frequency and urgency. The patient also reported some ongoing light vaginal bleeding for which she was restarted on plavix for.  The patient most recently followed up with Dr. Berline [name] on 01/14/21. During which time, the patient denied any further vaginal bleeding since her last visit, and denied any significant abdominal or pelvic pain  Pertinent imaging thus far includes a CT of the abdomen and pelvis on 01/17/21 which revealed no evidence of metastatic disease within the abdomen or pelvis.   Of note, the patient presented to the ED On 11/30/20 with vaginal bleeding. The patient detailed the bleeding as cycles of waxing/waning heavy bleeding on progesterone. The patient was/is on dual anticoagulants as well as high dose of progesterone which could have attributed some to her heavy bleeding episode. On call GYN, Dr. Trixie [name], recommended not holding the patient's anticoagulants nor adjusting progesterone dosage. The patient was discharged home with strict follow up instructions with her GYN (follow up detailed above).   PREVIOUS RADIATION THERAPY: No  PAST MEDICAL HISTORY:  Past Medical History:  Diagnosis Date   Acute  bronchospasm due to viral infection 2020   Due to pollen   Anemia    history of   CVA (cerebral vascular accident) ([place]) 02/28/2020   and 04/19/20   Depressive disorder    Dyspnea    Dysrhythmia    Endometrial cancer ([place]) 12/26/2020   Epistaxis    Fatigue    Hearing loss    History of loop recorder    Hypothyroidism    Memory change    Migraine    Obesity (BMI 30.0-34.9)    lost 60 lbs   Osteoarthritis    Osteoporosis    Polyuria    Premature menopause    Seizures ([place]) 1994   history of mini seizures, possible migraine induced    PAST SURGICAL HISTORY: Past Surgical History:  Procedure Laterality Date   ABLATION  2006   BUBBLE STUDY  04/23/2020   Procedure: BUBBLE STUDY;  Surgeon: Larey Dresser, MD;  Location: [place];  Service: Cardiovascular;;   CATARACT EXTRACTION W/ INTRAOCULAR LENS IMPLANT Bilateral    COLONOSCOPY     COLONOSCOPY WITH PROPOFOL N/A 12/22/2018   Procedure: COLONOSCOPY WITH PROPOFOL;  Surgeon: Virgel Manifold, MD;  Location: [facility];  Service: Endoscopy;  Laterality: N/A;   EYE SURGERY  Spring 2018   Cataracts both eyes   HYSTEROSCOPY WITH D & C N/A 11/01/2020   Procedure: DILATATION AND CURETTAGE /HYSTEROSCOPY WITH MYOSURE;  Surgeon: Natalie Mosses, MD;  Location: [facility];  Service: Gynecology;  Laterality: N/A;   LAPAROTOMY  12/18/2020   Procedure: LAPAROTOMY;  Surgeon: Natalie Mosses, MD;  Location: [facility];  Service: Gynecology;;   LOOP RECORDER  INSERTION N/A 04/23/2020   Procedure: LOOP RECORDER INSERTION;  Surgeon: Evans Lance, MD;  Location: [place] [facility];  Service: Cardiovascular;  Laterality: N/A;   ROBOTIC ASSISTED TOTAL HYSTERECTOMY WITH BILATERAL SALPINGO OOPHERECTOMY Bilateral 12/18/2020   Procedure: XI ROBOTIC ASSISTED TOTAL HYSTERECTOMY WITH BILATERAL SALPINGO OOPHORECTOMY;  Surgeon: Natalie Mosses, MD;  Location: [facility];  Service: Gynecology;  Laterality: Bilateral;   SENTINEL NODE BIOPSY N/A  12/18/2020   Procedure: SENTINEL NODE BIOPSY;  Surgeon: Natalie Mosses, MD;  Location: [facility];  Service: Gynecology;  Laterality: N/A;   TEE WITHOUT CARDIOVERSION N/A 04/23/2020   Procedure: TRANSESOPHAGEAL ECHOCARDIOGRAM (TEE);  Surgeon: Larey Dresser, MD;  Location[facility];  Service: Cardiovascular;  Laterality: N/A;   TONSILLECTOMY      FAMILY HISTORY:  Family History  Problem Relation Age of Onset   Early death Father        suicide   Depression Father    Diabetes Father    Cancer Father        prostate   Hearing loss Father        due to war   Alzheimer's disease Mother    Heart disease Brother    Atrial fibrillation Brother    Heart disease Maternal Aunt    Dementia Maternal Aunt    Heart disease Maternal Uncle    Dementia Maternal Grandmother    Heart disease Brother    Atrial fibrillation Brother    Colon cancer Neg Hx    Breast cancer Neg Hx    Ovarian cancer Neg Hx    Pancreatic cancer Neg Hx    Endometrial cancer Neg Hx     SOCIAL HISTORY:  Social History   Tobacco Use   Smoking status: Former    Packs/day: 0.50    Years: 6.00    Pack years: 3.00    Types: Cigarettes    Quit date: 02/11/1974    Years since quitting: 47.0   Smokeless tobacco: Never  Vaping Use   Vaping Use: Never used  Substance Use Topics   Alcohol use: Yes    Comment: rare   Drug use: Never    ALLERGIES:  Allergies  Allergen Reactions   Sulfamethoxazole-Trimethoprim Hives    MEDICATIONS:  Current Outpatient Medications  Medication Sig Dispense Refill   atorvastatin (LIPITOR) 40 MG tablet Take 1 tablet (40 mg total) by mouth daily. 90 tablet 1   buPROPion (WELLBUTRIN SR) 150 MG 12 hr tablet Take 150 mg by mouth 2 (two) times daily.     citalopram (CELEXA) 40 MG tablet TAKE 1 TABLET BY MOUTH EVERY DAY 90 tablet 1   clindamycin-benzoyl peroxide (BENZACLIN) gel Apply topically 2 (two) times daily. (Patient taking differently: Apply 1 application topically 2 (two)  times daily as needed (facial acne).) 50 g 0   clopidogrel (PLAVIX) 75 MG tablet Take 75 mg by mouth daily.     Cyanocobalamin (VITAMIN B-12) 1000 MCG SUBL Place 1,000 mcg under the tongue at bedtime.     levothyroxine (SYNTHROID) 75 MCG tablet TAKE 1 TABLET(75 MCG) BY MOUTH DAILY BEFORE BREAKFAST 90 tablet 1   Multiple Vitamin (MULTIVITAMIN WITH MINERALS) TABS tablet Take 1 tablet by mouth daily.     polyvinyl alcohol (LIQUIFILM TEARS) 1.4 % ophthalmic solution Place 1 drop into both eyes daily as needed for dry eyes.     rivaroxaban (XARELTO) 20 MG TABS tablet Take 1 tablet (20 mg total) by mouth daily with supper. Resume on 12/21/2020 30  tablet 0   Ubrogepant (UBRELVY) 100 MG TABS Take 100 mg by mouth daily as needed (migraines).     Vitamin D, Ergocalciferol, (DRISDOL) 1.25 MG (50000 UNIT) CAPS capsule TAKE 1 CAPSULE BY MOUTH EVERY 7 DAYS (Patient taking differently: Take 50,000 Units by mouth every Monday. TAKE 1 CAPSULE BY MOUTH EVERY 7 DAYS) 12 capsule 1   No current facility-administered medications for this encounter.    REVIEW OF SYSTEMS:  A 10+ POINT REVIEW OF SYSTEMS WAS OBTAINED including neurology, dermatology, psychiatry, cardiac, respiratory, lymph, extremities, GI, GU, musculoskeletal, constitutional, reproductive, HEENT.  She reports ongoing speech issues after her 2 strokes.  She reports what sounds like expressive aphasia.  She denies any focal motor weakness in her extremities.  She also reports problems with her memory.  She denies any pelvic pain or vaginal bleeding at this time.  He denies any urination difficulties or bowel complaints.   PHYSICAL EXAM:  height is 5' 5"  (1.651 m) and weight is 168 lb (76.2 kg). Her temperature is 98 F (36.7 C). Her blood pressure is 119/81 and her pulse is 91. Her respiration is 18 and oxygen saturation is 95%.   General: Alert and oriented, in no acute distress HEENT: Head is normocephalic. Extraocular movements are intact. Oropharynx is  clear. Neck: Neck is supple, no palpable cervical or supraclavicular lymphadenopathy. Heart: Regular in rate and rhythm with no murmurs, rubs, or gallops. Chest: Clear to auscultation bilaterally, with no rhonchi, wheezes, or rales. Abdomen: Soft, nontender, nondistended, with no rigidity or guarding. Extremities: No cyanosis or edema. Lymphatics: see Neck Exam Skin: No concerning lesions. Musculoskeletal: symmetric strength and muscle tone throughout. Neurologic: Cranial nerves II through XII are grossly intact. No obvious focalities.  Coordination is intact.  Mild expressive aphasia Psychiatric: Judgment and insight are intact. Affect is appropriate. Pelvic exam deferred in light of recent surgery.  Will be performed at the time of her vaginal brachytherapy planning and first treatment.  ECOG = 1  0 - Asymptomatic (Fully active, able to carry on all predisease activities without restriction)  1 - Symptomatic but completely ambulatory (Restricted in physically strenuous activity but ambulatory and able to carry out work of a light or sedentary nature. For example, light housework, office work)  2 - Symptomatic, <50% in bed during the day (Ambulatory and capable of all self care but unable to carry out any work activities. Up and about more than 50% of waking hours)  3 - Symptomatic, >50% in bed, but not bedbound (Capable of only limited self-care, confined to bed or chair 50% or more of waking hours)  4 - Bedbound (Completely disabled. Cannot carry on any self-care. Totally confined to bed or chair)  5 - Death   Eustace Pen MM, Creech RH, Tormey DC, et al. [phone]). "Toxicity and response criteria of the [facility] Group". [place] Oncol. 5 (6): 649-55  LABORATORY DATA:  Lab Results  Component Value Date   WBC 6.4 12/20/2020   HGB 8.6 (L) 12/20/2020   HCT 26.2 (L) 12/20/2020   MCV 92.9 12/20/2020   PLT 236 12/20/2020   NEUTROABS 3.5 06/25/2020   Lab Results   Component Value Date   NA 138 12/20/2020   K 3.6 12/20/2020   CL 110 12/20/2020   CO2 22 12/20/2020   GLUCOSE 102 (H) 12/20/2020   CREATININE 0.69 12/20/2020   CALCIUM 8.1 (L) 12/20/2020      RADIOGRAPHY: CT Abdomen Pelvis W Contrast  Result Date: 01/18/2021 CLINICAL  DATA:  Endometrial carcinoma. Recent hysterectomy. Staging. EXAM: CT ABDOMEN AND PELVIS WITH CONTRAST TECHNIQUE: Multidetector CT imaging of the abdomen and pelvis was performed using the standard protocol following bolus administration of intravenous contrast. CONTRAST:  56m OMNIPAQUE IOHEXOL 350 MG/ML SOLN COMPARISON:  04/23/2020 FINDINGS: Lower Chest: No acute findings. Hepatobiliary: No hepatic masses identified. 1.8 cm calcified gallstone again noted, however there is no evidence of cholecystitis or biliary ductal dilatation. Pancreas:  No mass or inflammatory changes. Spleen: Within normal limits in size and appearance. Adrenals/Urinary Tract: No masses identified. No evidence of ureteral calculi or hydronephrosis. Stomach/Bowel: No evidence of obstruction, inflammatory process or abnormal fluid collections. Normal appendix visualized. Diverticulosis is seen mainly involving the sigmoid colon, however there is no evidence of diverticulitis. Vascular/Lymphatic: No pathologically enlarged lymph nodes. No acute vascular findings. Reproductive: Prior hysterectomy noted. Adnexal regions are unremarkable in appearance. Other:  None. Musculoskeletal:  No suspicious bone lesions identified. IMPRESSION: Prior hysterectomy. No acute findings. No evidence of metastatic disease within the abdomen or pelvis. Cholelithiasis. No radiographic evidence of cholecystitis. Colonic diverticulosis. No radiographic evidence of diverticulitis. Electronically Signed   By: JMarlaine HindM.D.   On: 01/18/2021 16:52      IMPRESSION: Stage 1A grade 1 endometrioid endometrial adenocarcinoma  Today I discussed with the patient potential adjuvant radiation  therapy options.  We [place]  We discussed that this treatment would likely result in significantly more side effects compared to vaginal brachytherapy.  We also reviewed the difficulty in determining the depth of invasion within the myometrium but pathology estimates myometrial involvement less than 50%.  She did not have lymphatic mapping on both sides that given her comorbidities.  I discussed in detail the procedure involved with vaginal brachytherapy.  We discussed this this type of radiation therapy will address one of the more common areas of recurrence of endometrial cancer.   We also discussed the potential long-term effects of vaginal brachytherapy.  At this time the patient would like to proceed with vaginal brachytherapy alone understanding that if she were to have a recurrence within the pelvis she would still be a candidate for external beam radiation therapy.  PLAN: She will be set up for vaginal brachytherapy planning and her first treatment approximately 6 to 7 weeks out from her surgery.  Anticipate 5 high-dose-rate treatments directed at the vaginal cuff using iridium 192 is the high-dose-rate source.   60 minutes of total time was spent for this patient encounter, including preparation, face-to-face counseling with the patient and coordination of care, physical exam, and documentation of the encounter.   ------------------------------------------------  JBlair Promise PhD, MD  This document serves as a record of services personally performed by JGery Pray MD. It was created on his behalf by ERoney Mans a trained medical scribe. The creation of this record is based on the scribe's personal observations and the provider's statements to them. This document has been checked and approved by the attending provider.

## 2021-01-31 ENCOUNTER — Ambulatory Visit
Admission: RE | Admit: 2021-01-31 | Discharge: 2021-01-31 | Disposition: A | Discharge status: Home / Self Care | Payer: Medicare Other | Source: Ambulatory Visit | Attending: Radiation Oncology | Admitting: Radiation Oncology

## 2021-01-31 ENCOUNTER — Ambulatory Visit: Payer: Medicare Other

## 2021-01-31 ENCOUNTER — Encounter: Payer: Self-pay | Admitting: Radiation Oncology

## 2021-01-31 VITALS — BP 119/81 | HR 91 | Temp 98.0°F | Resp 18 | Ht 65.0 in | Wt 168.0 lb

## 2021-01-31 DIAGNOSIS — Z79899 Other long term (current) drug therapy: Secondary | ICD-10-CM | POA: Insufficient documentation

## 2021-01-31 DIAGNOSIS — M1990 Unspecified osteoarthritis, unspecified site: Secondary | ICD-10-CM | POA: Insufficient documentation

## 2021-01-31 DIAGNOSIS — C541 Malignant neoplasm of endometrium: Secondary | ICD-10-CM | POA: Insufficient documentation

## 2021-01-31 DIAGNOSIS — Z90722 Acquired absence of ovaries, bilateral: Secondary | ICD-10-CM | POA: Insufficient documentation

## 2021-01-31 DIAGNOSIS — K5730 Diverticulosis of large intestine without perforation or abscess without bleeding: Secondary | ICD-10-CM | POA: Diagnosis not present

## 2021-01-31 DIAGNOSIS — Z8673 Personal history of transient ischemic attack (TIA), and cerebral infarction without residual deficits: Secondary | ICD-10-CM | POA: Diagnosis not present

## 2021-01-31 DIAGNOSIS — E039 Hypothyroidism, unspecified: Secondary | ICD-10-CM | POA: Diagnosis not present

## 2021-01-31 DIAGNOSIS — Z90710 Acquired absence of both cervix and uterus: Secondary | ICD-10-CM | POA: Insufficient documentation

## 2021-01-31 DIAGNOSIS — Z7901 Long term (current) use of anticoagulants: Secondary | ICD-10-CM | POA: Diagnosis not present

## 2021-01-31 DIAGNOSIS — Z87891 Personal history of nicotine dependence: Secondary | ICD-10-CM | POA: Diagnosis not present

## 2021-01-31 NOTE — Progress Notes (Signed)
See MD note for nursing evaluation. °

## 2021-02-01 ENCOUNTER — Encounter: Payer: Self-pay | Admitting: Radiation Oncology

## 2021-02-06 NOTE — Progress Notes (Signed)
Natalie [name] is here today for [facility] fitting and treatment.  They did not have external beam treatment.   Does the patient complain of any of the following:  Pain:denies Abdominal bloating: denies Diarrhea/Constipation: denies Nausea/Vomiting: denies Vaginal Discharge: denies Blood in Urine or Stool: denies Urinary Issues (dysuria/incomplete emptying/ incontinence/ increased frequency/urgency/nocturia): urgency, nocturia x1 Post radiation skin changes: not applicable   Additional comments if applicable: irritated whitehead on labia  Vitals:   02/11/21 0806  BP: (!) 141/79  Pulse: 73  Resp: 18  Temp: (!) 97.3 F (36.3 C)  TempSrc: Temporal  SpO2: 100%  Weight: 168 lb 8 oz (76.4 kg)  Height: 5\' 5"  (1.651 m)

## 2021-02-08 ENCOUNTER — Telehealth: Payer: Self-pay | Admitting: *Deleted

## 2021-02-08 NOTE — Telephone Encounter (Signed)
CALLED PATIENT TO REMIND OF NEW [facility] FOR 02-11-21, SPOKE WITH PATIENT AND SHE IS AWARE OF THESE APPTS.

## 2021-02-09 NOTE — Progress Notes (Signed)
Radiation Oncology         [phone] ________________________________  Name: Natalie [name] MRN: [id number]  Date: 02/11/2021  DOB: 1951/01/03  Vaginal Brachytherapy Procedure Note  CC: [name], MD Lafonda Mosses, MD    ICD-10-CM   1. Endometrial cancer (HCC)  C54.1       Diagnosis: The encounter diagnosis was Endometrial cancer ([place]).    Stage 1A grade 1 endometrioid endometrial adenocarcinoma    Narrative: She returns today for vaginal cylinder fitting.  She denies any new medical issues since she was last seen in consultation.  She specifically denies any bladder or rectal issues.  She denies any vaginal bleeding or discharge.  No significant interval history since the patient was seen for her initial consultation on 01/31/21.   ALLERGIES: is allergic to sulfamethoxazole-trimethoprim.  Meds: Current Outpatient Medications  Medication Sig Dispense Refill   atorvastatin (LIPITOR) 40 MG tablet Take 1 tablet (40 mg total) by mouth daily. 90 tablet 1   buPROPion (WELLBUTRIN SR) 150 MG 12 hr tablet Take 150 mg by mouth 2 (two) times daily.     citalopram (CELEXA) 40 MG tablet TAKE 1 TABLET BY MOUTH EVERY DAY 90 tablet 1   clindamycin-benzoyl peroxide (BENZACLIN) gel Apply topically 2 (two) times daily. (Patient taking differently: Apply 1 application topically 2 (two) times daily as needed (facial acne).) 50 g 0   clopidogrel (PLAVIX) 75 MG tablet Take 75 mg by mouth daily.     Cyanocobalamin (VITAMIN B-12) 1000 MCG SUBL Place 1,000 mcg under the tongue at bedtime.     levothyroxine (SYNTHROID) 75 MCG tablet TAKE 1 TABLET(75 MCG) BY MOUTH DAILY BEFORE BREAKFAST 90 tablet 1   Multiple Vitamin (MULTIVITAMIN WITH MINERALS) TABS tablet Take 1 tablet by mouth daily.     polyvinyl alcohol (LIQUIFILM TEARS) 1.4 % ophthalmic solution Place 1 drop into both eyes daily as needed for dry eyes.     rivaroxaban (XARELTO) 20 MG TABS tablet Take 1 tablet (20 mg total) by  mouth daily with supper. Resume on 12/21/2020 30 tablet 0   Ubrogepant (UBRELVY) 100 MG TABS Take 100 mg by mouth daily as needed (migraines).     Vitamin D, Ergocalciferol, (DRISDOL) 1.25 MG (50000 UNIT) CAPS capsule TAKE 1 CAPSULE BY MOUTH EVERY 7 DAYS (Patient taking differently: Take 50,000 Units by mouth every Monday. TAKE 1 CAPSULE BY MOUTH EVERY 7 DAYS) 12 capsule 1   No current facility-administered medications for this encounter.    Physical Findings: The patient is in no acute distress. Patient is alert and oriented.  height is 5\' 5"  (1.651 m) and weight is 168 lb 8 oz (76.4 kg). Her temporal temperature is 97.3 F (36.3 C) (abnormal). Her blood pressure is 141/79 (abnormal) and her pulse is 73. Her respiration is 18 and oxygen saturation is 100%.   No palpable cervical, supraclavicular or axillary lymphoadenopathy. The heart has a regular rate and rhythm. The lungs are clear to auscultation. Abdomen soft and non-tender.  On pelvic examination the external genitalia were unremarkable. A speculum exam was performed. Vaginal cuff intact, no mucosal lesions. On bimanual exam there were no pelvic masses appreciated.  Lab Findings: Lab Results  Component Value Date   WBC 6.4 12/20/2020   HGB 8.6 (L) 12/20/2020   HCT 26.2 (L) 12/20/2020   MCV 92.9 12/20/2020   PLT 236 12/20/2020    Radiographic Findings: CT Abdomen Pelvis W Contrast  Result Date: 01/18/2021 CLINICAL DATA:  Endometrial carcinoma. Recent hysterectomy.  Staging. EXAM: CT ABDOMEN AND PELVIS WITH CONTRAST TECHNIQUE: Multidetector CT imaging of the abdomen and pelvis was performed using the standard protocol following bolus administration of intravenous contrast. CONTRAST:  26mL OMNIPAQUE IOHEXOL 350 MG/ML SOLN COMPARISON:  04/23/2020 FINDINGS: Lower Chest: No acute findings. Hepatobiliary: No hepatic masses identified. 1.8 cm calcified gallstone again noted, however there is no evidence of cholecystitis or biliary ductal  dilatation. Pancreas:  No mass or inflammatory changes. Spleen: Within normal limits in size and appearance. Adrenals/Urinary Tract: No masses identified. No evidence of ureteral calculi or hydronephrosis. Stomach/Bowel: No evidence of obstruction, inflammatory process or abnormal fluid collections. Normal appendix visualized. Diverticulosis is seen mainly involving the sigmoid colon, however there is no evidence of diverticulitis. Vascular/Lymphatic: No pathologically enlarged lymph nodes. No acute vascular findings. Reproductive: Prior hysterectomy noted. Adnexal regions are unremarkable in appearance. Other:  None. Musculoskeletal:  No suspicious bone lesions identified. IMPRESSION: Prior hysterectomy. No acute findings. No evidence of metastatic disease within the abdomen or pelvis. Cholelithiasis. No radiographic evidence of cholecystitis. Colonic diverticulosis. No radiographic evidence of diverticulitis. Electronically Signed   By: Marlaine Hind M.D.   On: 01/18/2021 16:52    Impression: Stage 1A grade 1 endometrioid endometrial adenocarcinoma  Patient was fitted for a vaginal cylinder.  The vaginal vault is significantly narrowed and long but does permit index finger and 2 cm diameter cylinder. the patient will be treated with a 2.0 cm diameter cylinder with a treatment length of 3.0 cm. This distended the vaginal vault without undue discomfort. The patient tolerated the procedure well.  The patient was successfully fitted for a vaginal cylinder. The patient is appropriate to begin vaginal brachytherapy.   Plan: The patient will proceed with CT simulation and vaginal brachytherapy today.    _______________________________   Blair Promise, PhD, MD  This document serves as a record of services personally performed by Gery Pray, MD. It was created on his behalf by Roney Mans, a trained medical scribe. The creation of this record is based on the scribe's personal observations and the  provider's statements to them. This document has been checked and approved by the attending provider.

## 2021-02-09 NOTE — Progress Notes (Signed)
°  Radiation Oncology         [phone] ________________________________  Name: Natalie [name] MRN: [id number]  Date: 02/11/2021  DOB: [birth year]  CC: [name], MD  Lafonda Mosses, MD  HDR BRACHYTHERAPY NOTE  DIAGNOSIS: The encounter diagnosis was Endometrial cancer [facility]).    Stage 1A grade 1 endometrioid endometrial adenocarcinoma   Simple treatment device note: Patient had construction of her custom vaginal cylinder. She will be treated with a 2.0 cm diameter segmented cylinder. This conforms to her anatomy without undue discomfort.  Vaginal brachytherapy procedure node: The patient was brought to the [place] suite. Identity was confirmed. All relevant records and images related to the planned course of therapy were reviewed. The patient freely provided informed written consent to proceed with treatment after reviewing the details related to the planned course of therapy. The consent form was witnessed and verified by the simulation staff. Then, the patient was set-up in a stable reproducible supine position for radiation therapy. Pelvic exam revealed the vaginal cuff to be intact . [name] custom vaginal cylinder was placed in the proximal vagina. This was affixed to the CT/MR stabilization plate to prevent slippage. Patient tolerated the placement well.  Verification simulation note:  A fiducial marker was placed within the vaginal cylinder. An AP and lateral film was then obtained through the pelvis area. This documented accurate position of the vaginal cylinder for treatment.  HDR BRACHYTHERAPY TREATMENT  The remote afterloading device was affixed to the vaginal cylinder by catheter. Patient then proceeded to undergo her first high-dose-rate treatment directed at the proximal vagina. The patient was prescribed a dose of 6.0 gray to be delivered to the mucosal surface. Treatment length was 3.0 cm. Patient was treated with 1 channel using 7 dwell positions. Treatment time  was 159.1 seconds. Iridium 192 was the high-dose-rate source for treatment. The patient tolerated the treatment well. After completion of her therapy, a radiation survey was performed documenting return of the iridium source into the GammaMed safe.   PLAN: The patient will return next week for her second high-dose-rate treatment.  ________________________________  -----------------------------------  Blair Promise, PhD, MD  This document serves as a record of services personally performed by Gery Pray, MD. It was created on his behalf by Roney Mans, a trained medical scribe. The creation of this record is based on the scribe's personal observations and the provider's statements to them. This document has been checked and approved by the attending provider.

## 2021-02-11 ENCOUNTER — Other Ambulatory Visit: Payer: Self-pay

## 2021-02-11 ENCOUNTER — Ambulatory Visit
Admission: RE | Admit: 2021-02-11 | Discharge: 2021-02-11 | Disposition: A | Discharge status: Home / Self Care | Payer: Medicare Other | Source: Ambulatory Visit | Attending: Radiation Oncology | Admitting: Radiation Oncology

## 2021-02-11 ENCOUNTER — Encounter: Payer: Self-pay | Admitting: Radiation Oncology

## 2021-02-11 VITALS — BP 141/79 | HR 73 | Temp 97.3°F | Resp 18 | Ht 65.0 in | Wt 168.5 lb

## 2021-02-11 DIAGNOSIS — Z79899 Other long term (current) drug therapy: Secondary | ICD-10-CM | POA: Insufficient documentation

## 2021-02-11 DIAGNOSIS — C541 Malignant neoplasm of endometrium: Secondary | ICD-10-CM | POA: Diagnosis not present

## 2021-02-11 DIAGNOSIS — Z7901 Long term (current) use of anticoagulants: Secondary | ICD-10-CM | POA: Diagnosis not present

## 2021-02-11 DIAGNOSIS — K5730 Diverticulosis of large intestine without perforation or abscess without bleeding: Secondary | ICD-10-CM | POA: Insufficient documentation

## 2021-02-11 DIAGNOSIS — K8020 Calculus of gallbladder without cholecystitis without obstruction: Secondary | ICD-10-CM | POA: Insufficient documentation

## 2021-02-11 DIAGNOSIS — Z510 Encounter for antineoplastic radiation therapy: Secondary | ICD-10-CM | POA: Insufficient documentation

## 2021-02-11 NOTE — Progress Notes (Signed)
See MD note for nursing evaluation. °

## 2021-02-12 ENCOUNTER — Encounter: Payer: Self-pay | Admitting: Radiation Oncology

## 2021-02-19 ENCOUNTER — Other Ambulatory Visit: Payer: Self-pay | Admitting: Family Medicine

## 2021-02-19 NOTE — Telephone Encounter (Signed)
Requested Prescriptions  Pending Prescriptions Disp Refills   citalopram (CELEXA) 40 MG tablet [Pharmacy Med Name: CITALOPRAM 40MG  TABLETS] 90 tablet 1    Sig: TAKE 1 TABLET BY MOUTH EVERY DAY     Psychiatry:  Antidepressants - SSRI Passed - 02/19/2021  6:20 AM      Passed - Completed PHQ-2 or PHQ-9 in the last 360 days      Passed - Valid encounter within last 6 months    Recent Outpatient Visits          2 months ago Ovarian mass   [facility] [place], Drue Stager, MD   6 months ago Recurrent cerebrovascular accidents (CVAs) [facility])   [facility] [place], Drue Stager, MD   9 months ago Recurrent cerebrovascular accidents (CVAs) [facility])   [facility] [name], MD   11 months ago History of CVA in adulthood   [facility] [name], MD   1 year ago Double vision with both eyes open   [facility] [name], MD      Future Appointments            In 3 weeks [name], MD [facility], [facility]            buPROPion [facility] SR) 150 MG 12 hr tablet [Pharmacy Med Name: BUPROPION SR 150MG  TABLETS (12 H)] 180 tablet     Sig: TAKE 2 TABLETS BY MOUTH EVERY DAY     Psychiatry: Antidepressants - bupropion Failed - 02/19/2021  6:20 AM      Failed - Last BP in normal range    BP Readings from Last 1 Encounters:  02/11/21 (!) 141/79         Passed - Completed PHQ-2 or PHQ-9 in the last 360 days      Passed - Valid encounter within last 6 months    Recent Outpatient Visits          2 months ago Ovarian mass   [facility] [place], Drue Stager, MD   6 months ago Recurrent cerebrovascular accidents (CVAs) [facility])   [facility] [name], MD   9 months ago Recurrent cerebrovascular accidents (CVAs) [facility])   [facility] [name], MD   11 months ago History of CVA in adulthood   [facility] [name], MD   1 year ago Double vision with both eyes open   [facility] [name], MD      Future Appointments            In 3 weeks [name], MD [facility], [facility]

## 2021-02-19 NOTE — Telephone Encounter (Signed)
Requested medications are due for refill today.  yes  Requested medications are on the active medications list.  yes  Last refill. 12/19/2020  Future visit scheduled.   yes  Notes to clinic.  Listed as historical medication and provider.    Requested Prescriptions  Pending Prescriptions Disp Refills   buPROPion (WELLBUTRIN SR) 150 MG 12 hr tablet [Pharmacy Med Name: BUPROPION SR 150MG  TABLETS (12 H)] 180 tablet     Sig: TAKE 2 TABLETS BY MOUTH EVERY DAY     Psychiatry: Antidepressants - bupropion Failed - 02/19/2021  6:20 AM      Failed - Last BP in normal range    BP Readings from Last 1 Encounters:  02/11/21 (!) 141/79          Passed - Completed PHQ-2 or PHQ-9 in the last 360 days      Passed - Valid encounter within last 6 months    Recent Outpatient Visits           2 months ago Ovarian mass   [facility] [place], Drue Stager, MD   6 months ago Recurrent cerebrovascular accidents (CVAs) [facility])   [facility] [place], Drue Stager, MD   9 months ago Recurrent cerebrovascular accidents (CVAs) [facility])   [facility] [name], MD   11 months ago History of CVA in adulthood   [facility] [name], MD   1 year ago Double vision with both eyes open   [facility] [name], MD       Future Appointments             In 3 weeks [name], MD [facility], [facility]            Signed Prescriptions Disp Refills   citalopram (CELEXA) 40 MG tablet 90 tablet 1    Sig: TAKE 1 TABLET BY MOUTH EVERY DAY     Psychiatry:  Antidepressants - SSRI Passed - 02/19/2021  6:20 AM      Passed - Completed PHQ-2 or PHQ-9 in the last 360 days      Passed - Valid encounter within last 6 months    Recent Outpatient Visits           2 months ago Ovarian mass   [facility] [place], Drue Stager, MD   6 months ago Recurrent cerebrovascular  accidents (CVAs) [facility])   [facility] [name], MD   9 months ago Recurrent cerebrovascular accidents ([facility]) [facility])   [name], MD   11 months ago History of CVA in adulthood   [facility] [name], MD   1 year ago Double vision with both eyes open   [facility] [name], MD       Future Appointments             In 3 weeks [name], MD [facility], [facility]

## 2021-02-20 ENCOUNTER — Telehealth: Payer: Self-pay | Admitting: *Deleted

## 2021-02-20 NOTE — Telephone Encounter (Signed)
CALLED PATIENT TO REMIND OF HDR TX. FOR 02-21-21 @ 11 AM, SPOKE WITH PATIENT AND SHE IS AWARE OF THIS [place].

## 2021-02-20 NOTE — Progress Notes (Signed)
°  Radiation Oncology         [phone] ________________________________  Name: Natalie [name] MRN: [id number]  Date: 02/21/2021  DOB: 1951/01/03  CC: [name], MD  Lafonda Mosses, MD  HDR BRACHYTHERAPY NOTE  DIAGNOSIS: The encounter diagnosis was Endometrial cancer [facility]).    Stage 1A grade 1 endometrioid endometrial adenocarcinoma   Simple treatment device note: Patient had construction of her custom vaginal cylinder. She will be treated with a 2.0 cm diameter segmented cylinder. This conforms to her anatomy without undue discomfort.  Vaginal brachytherapy procedure node: The patient was brought to the [place] suite. Identity was confirmed. All relevant records and images related to the planned course of therapy were reviewed. The patient freely provided informed written consent to proceed with treatment after reviewing the details related to the planned course of therapy. The consent form was witnessed and verified by the simulation staff. Then, the patient was set-up in a stable reproducible supine position for radiation therapy. Pelvic exam revealed the vaginal cuff to be intact . [name] custom vaginal cylinder was placed in the proximal vagina. This was affixed to the CT/MR stabilization plate to prevent slippage. Patient tolerated the placement well.  Verification simulation note:  A fiducial marker was placed within the vaginal cylinder. An AP and lateral film was then obtained through the pelvis area. This documented accurate position of the vaginal cylinder for treatment.  HDR BRACHYTHERAPY TREATMENT  The remote afterloading device was affixed to the vaginal cylinder by catheter. Patient then proceeded to undergo her second high-dose-rate treatment directed at the proximal vagina. The patient was prescribed a dose of 6.0 gray to be delivered to the mucosal surface. Treatment length was 3.0 cm. Patient was treated with 1 channel using 7 dwell positions. Treatment  time was 174.8 seconds. Iridium 192 was the high-dose-rate source for treatment. The patient tolerated the treatment well. After completion of her therapy, a radiation survey was performed documenting return of the iridium source into the GammaMed safe.   PLAN: The patient will return next week for her third high-dose-rate treatment.  Patient have reported some dizziness and feeling as if she was going up pass out several days prior to her procedure today.  She was taken to the nursing suite and orthostatic blood pressure and pulses were checked.  She did not appear to show any signs of dehydration.  I recommended she call her neurologist to discuss this issue as she has had CVAs in the past.  She denies missing any of her Xarelto or Plavix.  ________________________________  -----------------------------------  Blair Promise, PhD, MD  This document serves as a record of services personally performed by Gery Pray, MD. It was created on his behalf by Roney Mans, a trained medical scribe. The creation of this record is based on the scribe's personal observations and the provider's statements to them. This document has been checked and approved by the attending provider.

## 2021-02-21 ENCOUNTER — Other Ambulatory Visit: Payer: Self-pay

## 2021-02-21 ENCOUNTER — Ambulatory Visit
Admission: RE | Admit: 2021-02-21 | Discharge: 2021-02-21 | Disposition: A | Discharge status: Home / Self Care | Payer: Medicare Other | Source: Ambulatory Visit | Attending: Radiation Oncology | Admitting: Radiation Oncology

## 2021-02-21 VITALS — BP 101/80

## 2021-02-21 DIAGNOSIS — C541 Malignant neoplasm of endometrium: Secondary | ICD-10-CM

## 2021-02-21 DIAGNOSIS — Z510 Encounter for antineoplastic radiation therapy: Secondary | ICD-10-CM | POA: Diagnosis not present

## 2021-02-22 ENCOUNTER — Telehealth: Payer: Self-pay | Admitting: *Deleted

## 2021-02-22 NOTE — Telephone Encounter (Signed)
CALLED PATIENT TO REMIND OF HDR TX. FOR 02-25-21 @ 10 AM, LVM FOR A RETURN CALL

## 2021-02-24 NOTE — Progress Notes (Signed)
°  Radiation Oncology         [phone] ________________________________  Name: Natalie [name] MRN: [id number]  Date: 02/25/2021  DOB: January 27, 1951  CC: [name], MD  Lafonda Mosses, MD  HDR BRACHYTHERAPY NOTE  DIAGNOSIS: The encounter diagnosis was Endometrial cancer [facility]).    Stage 1A grade 1 endometrioid endometrial adenocarcinoma   Simple treatment device note: Patient had construction of her custom vaginal cylinder. She will be treated with a 2.0 cm diameter segmented cylinder. This conforms to her anatomy without undue discomfort.  Vaginal brachytherapy procedure node: The patient was brought to the [place] suite. Identity was confirmed. All relevant records and images related to the planned course of therapy were reviewed. The patient freely provided informed written consent to proceed with treatment after reviewing the details related to the planned course of therapy. The consent form was witnessed and verified by the simulation staff. Then, the patient was set-up in a stable reproducible supine position for radiation therapy. Pelvic exam revealed the vaginal cuff to be intact . [name] custom vaginal cylinder was placed in the proximal vagina. This was affixed to the CT/MR stabilization plate to prevent slippage. Patient tolerated the placement well.  Verification simulation note:  A fiducial marker was placed within the vaginal cylinder. An AP and lateral film was then obtained through the pelvis area. This documented accurate position of the vaginal cylinder for treatment.  HDR BRACHYTHERAPY TREATMENT  The remote afterloading device was affixed to the vaginal cylinder by catheter. Patient then proceeded to undergo her third high-dose-rate treatment directed at the proximal vagina. The patient was prescribed a dose of 6.0 gray to be delivered to the mucosal surface. Treatment length was 3.0 cm. Patient was treated with 1 channel using 7 dwell positions. Treatment  time was 181.3 seconds. Iridium 192 was the high-dose-rate source for treatment. The patient tolerated the treatment well. After completion of her therapy, a radiation survey was performed documenting return of the iridium source into the GammaMed safe.   PLAN: The patient will return next week for her fourth high-dose-rate treatment.   ________________________________  -----------------------------------  Blair Promise, PhD, MD  This document serves as a record of services personally performed by Gery Pray, MD. It was created on his behalf by Roney Mans, a trained medical scribe. The creation of this record is based on the scribe's personal observations and the provider's statements to them. This document has been checked and approved by the attending provider.

## 2021-02-25 ENCOUNTER — Ambulatory Visit
Admission: RE | Admit: 2021-02-25 | Discharge: 2021-02-25 | Disposition: A | Discharge status: Home / Self Care | Payer: Medicare Other | Source: Ambulatory Visit | Attending: Radiation Oncology | Admitting: Radiation Oncology

## 2021-02-25 ENCOUNTER — Other Ambulatory Visit: Payer: Self-pay

## 2021-02-25 DIAGNOSIS — C541 Malignant neoplasm of endometrium: Secondary | ICD-10-CM | POA: Diagnosis not present

## 2021-02-25 DIAGNOSIS — Z510 Encounter for antineoplastic radiation therapy: Secondary | ICD-10-CM | POA: Diagnosis not present

## 2021-02-28 DIAGNOSIS — Z20822 Contact with and (suspected) exposure to covid-19: Secondary | ICD-10-CM | POA: Diagnosis not present

## 2021-02-28 DIAGNOSIS — Z23 Encounter for immunization: Secondary | ICD-10-CM | POA: Diagnosis not present

## 2021-03-05 ENCOUNTER — Telehealth: Payer: Self-pay | Admitting: *Deleted

## 2021-03-05 NOTE — Telephone Encounter (Signed)
CALLED PATIENT TO REMIND OF HDR [place] 03-06-21 @ 11 AM, SPOKE WITH PATIENT AND SHE IS AWARE OF THIS TX/

## 2021-03-05 NOTE — Progress Notes (Signed)
°  Radiation Oncology         [phone] ________________________________  Name: Natalie [name] MRN: [id number]  Date: 03/06/2021  DOB: September 02, 1950  CC: [name], MD  Lafonda Mosses, MD  HDR BRACHYTHERAPY NOTE  DIAGNOSIS: The encounter diagnosis was Endometrial cancer [facility]).    Stage 1A grade 1 endometrioid endometrial adenocarcinoma   Simple treatment device note: Patient had construction of her custom vaginal cylinder. She will be treated with a 2.0 cm diameter segmented cylinder. This conforms to her anatomy without undue discomfort.  Vaginal brachytherapy procedure node: The patient was brought to the [place] suite. Identity was confirmed. All relevant records and images related to the planned course of therapy were reviewed. The patient freely provided informed written consent to proceed with treatment after reviewing the details related to the planned course of therapy. The consent form was witnessed and verified by the simulation staff. Then, the patient was set-up in a stable reproducible supine position for radiation therapy. Pelvic exam revealed the vaginal cuff to be intact . [name] custom vaginal cylinder was placed in the proximal vagina. This was affixed to the CT/MR stabilization plate to prevent slippage. Patient tolerated the placement well.  Verification simulation note:  A fiducial marker was placed within the vaginal cylinder. An AP and lateral film was then obtained through the pelvis area. This documented accurate position of the vaginal cylinder for treatment.  HDR BRACHYTHERAPY TREATMENT  The remote afterloading device was affixed to the vaginal cylinder by catheter. Patient then proceeded to undergo her fourth high-dose-rate treatment directed at the proximal vagina. The patient was prescribed a dose of 6.0 gray to be delivered to the mucosal surface. Treatment length was 3.0 cm. Patient was treated with 1 channel using 7 dwell positions. Treatment  time was 116.8 seconds. Iridium 192 was the high-dose-rate source for treatment. The patient tolerated the treatment well. After completion of her therapy, a radiation survey was performed documenting return of the iridium source into the GammaMed safe.   PLAN: The patient will return next week for her fifth high-dose-rate treatment.   ________________________________  -----------------------------------  Blair Promise, PhD, MD  This document serves as a record of services personally performed by Gery Pray, MD. It was created on his behalf by Roney Mans, a trained medical scribe. The creation of this record is based on the scribe's personal observations and the provider's statements to them. This document has been checked and approved by the attending provider.

## 2021-03-06 ENCOUNTER — Ambulatory Visit
Admission: RE | Admit: 2021-03-06 | Discharge: 2021-03-06 | Disposition: A | Discharge status: Home / Self Care | Payer: Medicare Other | Source: Ambulatory Visit | Attending: Radiation Oncology | Admitting: Radiation Oncology

## 2021-03-06 ENCOUNTER — Other Ambulatory Visit: Payer: Self-pay

## 2021-03-06 DIAGNOSIS — C541 Malignant neoplasm of endometrium: Secondary | ICD-10-CM | POA: Diagnosis not present

## 2021-03-12 NOTE — Progress Notes (Signed)
Name: Natalie [name]   MRN: [id number]    DOB: 06/22/1950   Date:03/13/2021       Progress Note  Subjective  Chief Complaint  Follow Up  HPI  Endometrial Cancer: stage I, under the care of Dr. Berline Lopes - Gyn/oncologist  and also having Brachytherapy in [place] with Dr. Sondra Come. Last session tomorrow, completing her 5 course treatment . S/p hysterectomy . She had some loose bowels initially, now noticing some Bristol scale 1- advised high fiber diet and stay hydrated, she states every other day has a Bristol 4  Anemia unspecified: she went to [facility] for post-[facility] bleeding on 11/29/20 and found to be anemic, she had blood transfusion Nov 2022 and no labs since. She denies any pica, fatigue not as intense, but still has intermittent mild sob with activity. Occasionally some dizziness. We will recheck labs today   MDD: she  was under Dr. Leonette Most care for many years but he is retiring and she asked me to prescribe her medications from now on. she takes medications as prescribed. She is just finishing her endometrial cancer therapy, still worries about her health but emotionally doing better, getting out of the house to see friends and not feeling down in a while.   B12 deficiency: she ran out of supplements and we will recheck levels today   Hypothyroidism: she has been taking medication as prescribed last level at goal, continue medication and recheck TSH. Weight has been stable , gradual hair loss, mild change in bowel movements since started brachytherapy    Dyslipidemia/history of CVA times two: she is now taking statin therapy, she had two strokes since January 21 and April 23 2021  She was on Plavix and aspirin, currently on Xarelto and Plavix and off aspirin. Last LDL was at goal at 45 . We will recheck labs today   Thrombocytopenia: we will recheck labs today   Malnutrition: she has lost 69 lbs int he past 13  months, over 10 % of weight loss in the past year.She is currently stable  around in the 160 lbs. She does not want to gain it back. She is on 1700 calories per day   Patient Active Problem List   Diagnosis Date Noted   Major depression, recurrent, chronic ([place]) 03/13/2021   Mild protein-calorie malnutrition ([place]) 03/13/2021   Endometrial cancer ([place]) 12/26/2020   Postoperative anemia due to acute blood loss 12/19/2020   History of CVA in adulthood 04/19/2020   History of ischemic multifocal posterior circulation stroke 02/29/2020   Thrombocytopenia ([place]) 02/28/2020   Difficulty hearing 06/28/2015   Arthritis, degenerative 06/28/2015   Paresthesia 06/28/2015   Purpura, nonthrombopenic ([place]) 06/28/2015   Adult hypothyroidism 08/09/2014   Migraine without aura and without status migrainosus, not intractable 09/19/2008   Moderate major depression ([place]) 09/07/2006   OP (osteoporosis) 09/07/2006    Past Surgical History:  Procedure Laterality Date   ABLATION  2006   BUBBLE STUDY  04/23/2020   Procedure: BUBBLE STUDY;  Surgeon: Larey Dresser, MD;  Location: [place];  Service: Cardiovascular;;   CATARACT EXTRACTION W/ INTRAOCULAR LENS IMPLANT Bilateral    COLONOSCOPY     COLONOSCOPY WITH PROPOFOL N/A 12/22/2018   Procedure: COLONOSCOPY WITH PROPOFOL;  Surgeon: Virgel Manifold, MD;  Location: [facility] ENDOSCOPY;  Service: Endoscopy;  Laterality: N/A;   EYE SURGERY  Spring 2018   Cataracts both eyes   HYSTEROSCOPY WITH D & C N/A 11/01/2020   Procedure: DILATATION AND CURETTAGE /HYSTEROSCOPY WITH MYOSURE;  Surgeon: Lafonda Mosses, MD;  Location: [facility];  Service: Gynecology;  Laterality: N/A;   LAPAROTOMY  12/18/2020   Procedure: LAPAROTOMY;  Surgeon: Lafonda Mosses, MD;  Location: [facility];  Service: Gynecology;;   LOOP RECORDER INSERTION N/A 04/23/2020   Procedure: LOOP RECORDER INSERTION;  Surgeon: Evans Lance, MD;  Location: [facility];  Service: Cardiovascular;  Laterality: N/A;   ROBOTIC ASSISTED TOTAL HYSTERECTOMY WITH BILATERAL  SALPINGO OOPHERECTOMY Bilateral 12/18/2020   Procedure: XI ROBOTIC ASSISTED TOTAL HYSTERECTOMY WITH BILATERAL SALPINGO OOPHORECTOMY;  Surgeon: Lafonda Mosses, MD;  Location: [facility];  Service: Gynecology;  Laterality: Bilateral;   SENTINEL NODE BIOPSY N/A 12/18/2020   Procedure: SENTINEL NODE BIOPSY;  Surgeon: Lafonda Mosses, MD;  Location: [facility];  Service: Gynecology;  Laterality: N/A;   TEE WITHOUT CARDIOVERSION N/A 04/23/2020   Procedure: TRANSESOPHAGEAL ECHOCARDIOGRAM (TEE);  Surgeon: Larey Dresser, MD;  Location: [facility] END[facility]COPY;  Service: Cardiovascular;  Laterality: N/A;   TONSILLECTOMY      Family History  Problem Relation Age of Onset   Early death Father        suicide   Depression Father    Diabetes Father    Cancer Father        prostate   Hearing loss Father        due to war   Alzheimer's disease Mother    Heart disease Brother    Atrial fibrillation Brother    Heart disease Maternal Aunt    Dementia Maternal Aunt    Heart disease Maternal Uncle    Dementia Maternal Grandmother    Heart disease Brother    Atrial fibrillation Brother    Colon cancer Neg Hx    Breast cancer Neg Hx    Ovarian cancer Neg Hx    Pancreatic cancer Neg Hx    Endometrial cancer Neg Hx     Social History   Tobacco Use   Smoking status: Former    Packs/day: 0.50    Years: 6.00    Pack years: 3.00    Types: Cigarettes    Quit date: 02/11/1974    Years since quitting: 47.1   Smokeless tobacco: Never  Substance Use Topics   Alcohol use: Yes    Comment: rare     Current Outpatient Medications:    atorvastatin (LIPITOR) 40 MG tablet, Take 1 tablet (40 mg total) by mouth daily., Disp: 90 tablet, Rfl: 1   buPROPion (WELLBUTRIN SR) 150 MG 12 hr tablet, TAKE 2 TABLETS BY MOUTH EVERY DAY, Disp: 180 tablet, Rfl: 0   citalopram (CELEXA) 40 MG tablet, TAKE 1 TABLET BY MOUTH EVERY DAY, Disp: 90 tablet, Rfl: 1   clindamycin-benzoyl peroxide (BENZACLIN) gel, Apply topically 2 (two)  times daily. (Patient taking differently: Apply 1 application topically 2 (two) times daily as needed (facial acne).), Disp: 50 g, Rfl: 0   clopidogrel (PLAVIX) 75 MG tablet, Take 75 mg by mouth daily., Disp: , Rfl:    Cyanocobalamin (VITAMIN B-12) 1000 MCG SUBL, Place 1,000 mcg under the tongue at bedtime., Disp: , Rfl:    levothyroxine (SYNTHROID) 75 MCG tablet, TAKE 1 TABLET(75 MCG) BY MOUTH DAILY BEFORE BREAKFAST, Disp: 90 tablet, Rfl: 1   Multiple Vitamin (MULTIVITAMIN WITH MINERALS) TABS tablet, Take 1 tablet by mouth daily., Disp: , Rfl:    polyvinyl alcohol (LIQUIFILM TEARS) 1.4 % ophthalmic solution, Place 1 drop into both eyes daily as needed for dry eyes., Disp: , Rfl:    rivaroxaban (  XARELTO) 20 MG TABS tablet, Take 1 tablet (20 mg total) by mouth daily with supper. Resume on 12/21/2020, Disp: 30 tablet, Rfl: 0   Ubrogepant (UBRELVY) 100 MG TABS, Take 100 mg by mouth daily as needed (migraines)., Disp: , Rfl:    Vitamin D, Ergocalciferol, (DRISDOL) 1.25 MG (50000 UNIT) CAPS capsule, TAKE 1 CAPSULE BY MOUTH EVERY 7 DAYS, Disp: 12 capsule, Rfl: 1  Allergies  Allergen Reactions   Sulfamethoxazole-Trimethoprim Hives    I personally reviewed active problem list, medication list, allergies, family history, social history, health maintenance with the patient/caregiver today.   ROS  Constitutional: Negative for fever or weight change.  Respiratory: Negative for cough and shortness of breath.   Cardiovascular: Negative for chest pain or palpitations.  Gastrointestinal: Negative for abdominal pain, no bowel changes.  Musculoskeletal: Negative for gait problem or joint swelling.  Skin: Negative for rash.  Neurological: Negative for dizziness or headache.  No other specific complaints in a complete review of systems (except as listed in HPI above).   Objective  Vitals:   03/13/21 1015  BP: 136/70  Pulse: 92  Resp: 16  SpO2: 97%  Weight: 165 lb (74.8 kg)  Height: 5\' 5"  (1.651 m)     Body mass index is 27.46 kg/m.  Physical Exam  Constitutional: Patient appears well-developed and well-nourished. Overweight.  No distress.  HEENT: head atraumatic, normocephalic, pupils equal and reactive to light, neck supple Cardiovascular: Normal rate, regular rhythm and normal heart sounds.  No murmur heard. No BLE edema. Pulmonary/Chest: Effort normal and breath sounds normal. No respiratory distress. Abdominal: Soft.  There is no tenderness. Psychiatric: Patient has a normal mood and affect. behavior is normal. Judgment and thought content normal.    PHQ2/9: Depression screen [facility] 2/9 03/13/2021 12/11/2020 08/16/2020 05/11/2020 03/08/2020  Decreased Interest 0 0 0 0 0  Down, Depressed, Hopeless 0 0 0 0 0  PHQ - 2 Score 0 0 0 0 0  Altered sleeping 0 1 0 0 1  Tired, decreased energy 0 1 0 1 1  Change in appetite 0 0 0 0 0  Feeling bad or failure about yourself  0 0 0 0 0  Trouble concentrating 0 0 0 1 0  Moving slowly or fidgety/restless 0 0 0 0 0  Suicidal thoughts 0 0 0 0 0  PHQ-9 Score 0 2 0 2 2  Difficult doing work/chores - - - - -  Some recent data might be hidden    phq 9 is negative   Fall Risk: Fall Risk  03/13/2021 12/11/2020 08/16/2020 05/11/2020 03/08/2020  Falls in the past year? 1 1 1  0 0  Number falls in past yr: 1 1 0 0 0  Injury with Fall? 1 0 1 0 0  Comment - - - - -  Risk for fall due to : No Fall Risks Impaired balance/gait - - -  Risk for fall due to: Comment - - - - -  Follow up Falls prevention discussed Falls prevention discussed - - -      Functional Status Survey: Is the patient deaf or have difficulty hearing?: Yes Does the patient have difficulty seeing, even when wearing glasses/contacts?: No Does the patient have difficulty concentrating, remembering, or making decisions?: No Does the patient have difficulty walking or climbing stairs?: No Does the patient have difficulty dressing or bathing?: No Does the patient have difficulty doing errands  alone such as visiting a doctor's office or shopping?: No    Assessment &  Plan  1. Major depression, recurrent, chronic (HCC)  - buPROPion (WELLBUTRIN SR) 150 MG 12 hr tablet; Take 2 tablets (300 mg total) by mouth daily.  Dispense: 180 tablet; Refill: 0  2. Mild protein-calorie malnutrition ([place])  - CBC with Differential/Platelet - COMPLETE METABOLIC PANEL WITH GFR  3. History of CVA (cerebrovascular accident)  - atorvastatin (LIPITOR) 40 MG tablet; Take 1 tablet (40 mg total) by mouth daily.  Dispense: 90 tablet; Refill: 1  4. Purpura, nonthrombopenic ([place])  Stable and reassurance given   5. Endometrial cancer [facility])  Finishing therapy tomorrow   6. Adult hypothyroidism  - TSH  7. Age-related osteoporosis without current pathological fracture   8. B12 deficiency  - Vitamin B12  9. Dyslipidemia  - atorvastatin (LIPITOR) 40 MG tablet; Take 1 tablet (40 mg total) by mouth daily.  Dispense: 90 tablet; Refill: 1 - Lipid panel  10. Vitamin D deficiency  - Vitamin D, Ergocalciferol, (DRISDOL) 1.25 MG (50000 UNIT) CAPS capsule; Take 1 capsule (50,000 Units total) by mouth every 7 (seven) days.  Dispense: 12 capsule; Refill: 1 - VITAMIN D 25 Hydroxy (Vit-D Deficiency, Fractures)  11. Primary osteoarthritis involving multiple joints

## 2021-03-13 ENCOUNTER — Other Ambulatory Visit: Payer: Self-pay

## 2021-03-13 ENCOUNTER — Telehealth: Payer: Self-pay | Admitting: *Deleted

## 2021-03-13 ENCOUNTER — Ambulatory Visit (INDEPENDENT_AMBULATORY_CARE_PROVIDER_SITE_OTHER): Payer: Medicare Other | Admitting: Family Medicine

## 2021-03-13 ENCOUNTER — Ambulatory Visit: Payer: Medicare Other | Admitting: Radiation Oncology

## 2021-03-13 ENCOUNTER — Encounter: Payer: Self-pay | Admitting: Family Medicine

## 2021-03-13 VITALS — BP 136/70 | HR 92 | Resp 16 | Ht 65.0 in | Wt 165.0 lb

## 2021-03-13 DIAGNOSIS — R9431 Abnormal electrocardiogram [ECG] [EKG]: Secondary | ICD-10-CM | POA: Diagnosis not present

## 2021-03-13 DIAGNOSIS — E785 Hyperlipidemia, unspecified: Secondary | ICD-10-CM

## 2021-03-13 DIAGNOSIS — D692 Other nonthrombocytopenic purpura: Secondary | ICD-10-CM

## 2021-03-13 DIAGNOSIS — R5382 Chronic fatigue, unspecified: Secondary | ICD-10-CM | POA: Diagnosis not present

## 2021-03-13 DIAGNOSIS — C541 Malignant neoplasm of endometrium: Secondary | ICD-10-CM | POA: Diagnosis not present

## 2021-03-13 DIAGNOSIS — E559 Vitamin D deficiency, unspecified: Secondary | ICD-10-CM | POA: Diagnosis not present

## 2021-03-13 DIAGNOSIS — E039 Hypothyroidism, unspecified: Secondary | ICD-10-CM

## 2021-03-13 DIAGNOSIS — M159 Polyosteoarthritis, unspecified: Secondary | ICD-10-CM | POA: Diagnosis not present

## 2021-03-13 DIAGNOSIS — F339 Major depressive disorder, recurrent, unspecified: Secondary | ICD-10-CM | POA: Insufficient documentation

## 2021-03-13 DIAGNOSIS — M810 Age-related osteoporosis without current pathological fracture: Secondary | ICD-10-CM

## 2021-03-13 DIAGNOSIS — Z90710 Acquired absence of both cervix and uterus: Secondary | ICD-10-CM | POA: Diagnosis not present

## 2021-03-13 DIAGNOSIS — E538 Deficiency of other specified B group vitamins: Secondary | ICD-10-CM | POA: Diagnosis not present

## 2021-03-13 DIAGNOSIS — E441 Mild protein-calorie malnutrition: Secondary | ICD-10-CM | POA: Insufficient documentation

## 2021-03-13 DIAGNOSIS — Z8673 Personal history of transient ischemic attack (TIA), and cerebral infarction without residual deficits: Secondary | ICD-10-CM | POA: Diagnosis not present

## 2021-03-13 DIAGNOSIS — M150 Primary generalized (osteo)arthritis: Secondary | ICD-10-CM

## 2021-03-13 DIAGNOSIS — I10 Essential (primary) hypertension: Secondary | ICD-10-CM | POA: Diagnosis not present

## 2021-03-13 MED ORDER — VITAMIN D (ERGOCALCIFEROL) 1.25 MG (50000 UNIT) PO CAPS: 50000.0000 [IU] | ORAL_CAPSULE | ORAL | 1 refills | Status: DC

## 2021-03-13 MED ORDER — ATORVASTATIN CALCIUM 40 MG PO TABS: 40.0000 mg | ORAL_TABLET | Freq: Every day | ORAL | 1 refills | Status: DC

## 2021-03-13 MED ORDER — BUPROPION HCL ER (SR) 150 MG PO TB12: 300.0000 mg | ORAL_TABLET | Freq: Every day | ORAL | 0 refills | Status: DC

## 2021-03-13 NOTE — Telephone Encounter (Signed)
CALLED PATIENT TO REMIND OF [facility] [place] 03-14-21 @ 9 AM @ [facility], SPOKE WITH PATIENT AND SHE IS AWARE OF THIS [place].

## 2021-03-13 NOTE — Progress Notes (Signed)
°  Radiation Oncology         [phone] ________________________________  Name: Natalie [name] MRN: [id number]  Date: 03/14/2021  DOB: 17-Dec-1950  CC: [name], MD  Lafonda Mosses, MD  HDR BRACHYTHERAPY NOTE  DIAGNOSIS: The encounter diagnosis was Endometrial cancer [facility]).    Stage 1A grade 1 endometrioid endometrial adenocarcinoma   Simple treatment device note: Patient had construction of her custom vaginal cylinder. She will be treated with a 2.0 cm diameter segmented cylinder. This conforms to her anatomy without undue discomfort.  Vaginal brachytherapy procedure node: The patient was brought to the [facility] suite. Identity was confirmed. All relevant records and images related to the planned course of therapy were reviewed. The patient freely provided informed written consent to proceed with treatment after reviewing the details related to the planned course of therapy. The consent form was witnessed and verified by the simulation staff. Then, the patient was set-up in a stable reproducible supine position for radiation therapy. Pelvic exam revealed the vaginal cuff to be intact . [name] custom vaginal cylinder was placed in the proximal vagina. This was affixed to the CT/MR stabilization plate to prevent slippage. Patient tolerated the placement well.  Verification simulation note:  A fiducial marker was placed within the vaginal cylinder. An AP and lateral film was then obtained through the pelvis area. This documented accurate position of the vaginal cylinder for treatment.  HDR BRACHYTHERAPY TREATMENT  The remote afterloading device was affixed to the vaginal cylinder by catheter. Patient then proceeded to undergo her fifth high-dose-rate treatment directed at the proximal vagina. The patient was prescribed a dose of 6.0 gray to be delivered to the mucosal surface. Treatment length was 3.0 cm. Patient was treated with 1 channel using 7 dwell positions. Treatment time  was 125.8 seconds. Iridium 192 was the high-dose-rate source for treatment. The patient tolerated the treatment well. After completion of her therapy, a radiation survey was performed documenting return of the iridium source into the [employer] safe.   PLAN: The patient has completed her final high-dose-rate treatment. She will return in one month for routine follow-up. Overall she tolerated brachytherapy well with minimal side effects.  She mild fatigue is her only issue with her treatments. ________________________________  -----------------------------------  Blair Promise, PhD, MD  This document serves as a record of services personally performed by Gery Pray, MD. It was created on his behalf by Roney Mans, a trained medical scribe. The creation of this record is based on the scribe's personal observations and the provider's statements to them. This document has been checked and approved by the attending provider.

## 2021-03-14 ENCOUNTER — Encounter: Payer: Self-pay | Admitting: Radiation Oncology

## 2021-03-14 ENCOUNTER — Ambulatory Visit
Admission: RE | Admit: 2021-03-14 | Discharge: 2021-03-14 | Disposition: A | Discharge status: Home / Self Care | Payer: Medicare Other | Source: Ambulatory Visit | Attending: Radiation Oncology | Admitting: Radiation Oncology

## 2021-03-14 DIAGNOSIS — C541 Malignant neoplasm of endometrium: Secondary | ICD-10-CM | POA: Diagnosis not present

## 2021-03-14 LAB — COMPLETE METABOLIC PANEL WITH GFR
AG Ratio: 1.7 (calc) (ref 1.0–2.5)
ALT: 17 U/L (ref 6–29)
AST: 18 U/L (ref 10–35)
Albumin: 4 g/dL (ref 3.6–5.1)
Alkaline phosphatase (APISO): 79 U/L (ref 37–153)
BUN: 22 mg/dL (ref 7–25)
CO2: 29 mmol/L (ref 20–32)
Calcium: 9.3 mg/dL (ref 8.6–10.4)
Chloride: 105 mmol/L (ref 98–110)
Creat: 0.74 mg/dL (ref 0.60–1.00)
Globulin: 2.4 g/dL (calc) (ref 1.9–3.7)
Glucose, Bld: 86 mg/dL (ref 65–99)
Potassium: 4.1 mmol/L (ref 3.5–5.3)
Sodium: 139 mmol/L (ref 135–146)
Total Bilirubin: 0.5 mg/dL (ref 0.2–1.2)
Total Protein: 6.4 g/dL (ref 6.1–8.1)
eGFR: 86 mL/min/{1.73_m2} (ref >=60)

## 2021-03-14 LAB — CBC WITH DIFFERENTIAL/PLATELET
Absolute Monocytes: 170 cells/uL — ABNORMAL LOW (ref 200–950)
Basophils Absolute: 20 cells/uL (ref 0–200)
Basophils Relative: 0.6 %
Eosinophils Absolute: 88 cells/uL (ref 15–500)
Eosinophils Relative: 2.6 %
HCT: 36.4 % (ref 35.0–45.0)
Hemoglobin: 11.4 g/dL — ABNORMAL LOW (ref 11.7–15.5)
Lymphs Abs: 1272 cells/uL (ref 850–3900)
MCH: 27.1 pg (ref 27.0–33.0)
MCHC: 31.3 g/dL — ABNORMAL LOW (ref 32.0–36.0)
MCV: 86.5 fL (ref 80.0–100.0)
MPV: 10.1 fL (ref 7.5–12.5)
Monocytes Relative: 5 %
Neutro Abs: 1850 cells/uL (ref 1500–7800)
Neutrophils Relative %: 54.4 %
Platelets: 217 10*3/uL (ref 140–400)
RBC: 4.21 10*6/uL (ref 3.80–5.10)
RDW: 13.1 % (ref 11.0–15.0)
Total Lymphocyte: 37.4 %
WBC: 3.4 10*3/uL — ABNORMAL LOW (ref 3.8–10.8)

## 2021-03-14 LAB — LIPID PANEL
Cholesterol: 100 mg/dL (ref <200)
HDL: 31 mg/dL — ABNORMAL LOW (ref >=50)
LDL Cholesterol (Calc): 56 mg/dL (calc)
Non-HDL Cholesterol (Calc): 69 mg/dL (calc) (ref <130)
Total CHOL/HDL Ratio: 3.2 (calc) (ref <5.0)
Triglycerides: 52 mg/dL (ref <150)

## 2021-03-14 LAB — VITAMIN B12: Vitamin B-12: 1325 pg/mL — ABNORMAL HIGH (ref 200–1100)

## 2021-03-14 LAB — VITAMIN D 25 HYDROXY (VIT D DEFICIENCY, FRACTURES): Vit D, 25-Hydroxy: 128 ng/mL — ABNORMAL HIGH (ref 30–100)

## 2021-03-14 LAB — TSH: TSH: 1.6 mIU/L (ref 0.40–4.50)

## 2021-04-02 ENCOUNTER — Ambulatory Visit (INDEPENDENT_AMBULATORY_CARE_PROVIDER_SITE_OTHER): Payer: Medicare Other

## 2021-04-02 VITALS — BP 108/72 | HR 82 | Temp 98.0°F | Resp 16 | Ht 65.0 in | Wt 160.7 lb

## 2021-04-02 DIAGNOSIS — Z1231 Encounter for screening mammogram for malignant neoplasm of breast: Secondary | ICD-10-CM | POA: Diagnosis not present

## 2021-04-02 DIAGNOSIS — Z0000 Encounter for general adult medical examination without abnormal findings: Secondary | ICD-10-CM

## 2021-04-02 DIAGNOSIS — Z780 Asymptomatic menopausal state: Secondary | ICD-10-CM | POA: Diagnosis not present

## 2021-04-02 NOTE — Patient Instructions (Signed)
Natalie [name] , Thank you for taking time to come for your Medicare Wellness Visit. I appreciate your ongoing commitment to your health goals. Please review the following plan we discussed and let me know if I can assist you in the future.   Screening recommendations/referrals: Colonoscopy: done 12/22/18. Due 12/2021 Mammogram: done 07/24/15. Please call [phone] to schedule your mammogram and bone density screening.  Bone Density: done 07/24/15 Recommended yearly ophthalmology/optometry visit for glaucoma screening and checkup Recommended yearly dental visit for hygiene and checkup  Vaccinations: Influenza vaccine: postponed Pneumococcal vaccine: done 11/03/16 Tdap vaccine: done 12/20/12 Shingles vaccine: Shingrix discussed. Please contact your pharmacy for coverage information.  Covid-19: done 03/15/19, 04/05/19, 11/28/19 & 02/28/21  Conditions/risks identified: Keep up the great work!  Next appointment: Follow up in one year for your annual wellness visit    Preventive Care 65 Years and Older, Female Preventive care refers to lifestyle choices and visits with your health care provider that can promote health and wellness. What does preventive care include? A yearly physical exam. This is also called an annual well check. Dental exams once or twice a year. Routine eye exams. Ask your health care provider how often you should have your eyes checked. Personal lifestyle choices, including: Daily care of your teeth and gums. Regular physical activity. Eating a healthy diet. Avoiding tobacco and drug use. Limiting alcohol use. Practicing safe sex. Taking low-dose aspirin every day. Taking vitamin and mineral supplements as recommended by your health care provider. What happens during an annual well check? The services and screenings done by your health care provider during your annual well check will depend on your age, overall health, lifestyle risk factors, and family history of  disease. Counseling  Your health care provider may ask you questions about your: Alcohol use. Tobacco use. Drug use. Emotional well-being. Home and relationship well-being. Sexual activity. Eating habits. History of falls. Memory and ability to understand (cognition). Work and work Statistician. Reproductive health. Screening  You may have the following tests or measurements: Height, weight, and BMI. Blood pressure. Lipid and cholesterol levels. These may be checked every 5 years, or more frequently if you are over 71 years old. Skin check. Lung cancer screening. You may have this screening every year starting at age 34 if you have a 30-pack-year history of smoking and currently smoke or have quit within the past 15 years. Fecal occult blood test (FOBT) of the stool. You may have this test every year starting at age 42. Flexible sigmoidoscopy or colonoscopy. You may have a sigmoidoscopy every 5 years or a colonoscopy every 10 years starting at age 39. Hepatitis C blood test. Hepatitis B blood test. Sexually transmitted disease (STD) testing. Diabetes screening. This is done by checking your blood sugar (glucose) after you have not eaten for a while (fasting). You may have this done every 1-3 years. Bone density scan. This is done to screen for osteoporosis. You may have this done starting at age 103. Mammogram. This may be done every 1-2 years. Talk to your health care provider about how often you should have regular mammograms. Talk with your health care provider about your test results, treatment options, and if necessary, the need for more tests. Vaccines  Your health care provider may recommend certain vaccines, such as: Influenza vaccine. This is recommended every year. Tetanus, diphtheria, and acellular pertussis (Tdap, Td) vaccine. You may need a Td booster every 10 years. Zoster vaccine. You may need this after age 56. Pneumococcal 13-valent conjugate (  PCV13) vaccine. One  dose is recommended after age 42. Pneumococcal polysaccharide (PPSV23) vaccine. One dose is recommended after age 92. Talk to your health care provider about which screenings and vaccines you need and how often you need them. This information is not intended to replace advice given to you by your health care provider. Make sure you discuss any questions you have with your health care provider. Document Released: 02/16/2015 Document Revised: 10/10/2015 Document Reviewed: 11/21/2014 Elsevier Interactive Patient Education  2017 [place] Prevention in the Home Falls can cause injuries. They can happen to people of all ages. There are many things you can do to make your home safe and to help prevent falls. What can I do on the outside of my home? Regularly fix the edges of walkways and driveways and fix any cracks. Remove anything that might make you trip as you walk through a door, such as a raised step or threshold. Trim any bushes or trees on the path to your home. Use bright outdoor lighting. Clear any walking paths of anything that might make someone trip, such as rocks or tools. Regularly check to see if handrails are loose or broken. Make sure that both sides of any steps have handrails. Any raised decks and porches should have guardrails on the edges. Have any leaves, snow, or ice cleared regularly. Use sand or salt on walking paths during winter. Clean up any spills in your garage right away. This includes oil or grease spills. What can I do in the bathroom? Use night lights. Install grab bars by the toilet and in the tub and shower. Do not use towel bars as grab bars. Use non-skid mats or decals in the tub or shower. If you need to sit down in the shower, use a plastic, non-slip stool. Keep the floor dry. Clean up any water that spills on the floor as soon as it happens. Remove soap buildup in the tub or shower regularly. Attach bath mats securely with double-sided  non-slip rug tape. Do not have throw rugs and other things on the floor that can make you trip. What can I do in the bedroom? Use night lights. Make sure that you have a light by your bed that is easy to reach. Do not use any sheets or blankets that are too big for your bed. They should not hang down onto the floor. Have a firm chair that has side arms. You can use this for support while you get dressed. Do not have throw rugs and other things on the floor that can make you trip. What can I do in the kitchen? Clean up any spills right away. Avoid walking on wet floors. Keep items that you use a lot in easy-to-reach places. If you need to reach something above you, use a strong step stool that has a grab bar. Keep electrical cords out of the way. Do not use floor polish or wax that makes floors slippery. If you must use wax, use non-skid floor wax. Do not have throw rugs and other things on the floor that can make you trip. What can I do with my stairs? Do not leave any items on the stairs. Make sure that there are handrails on both sides of the stairs and use them. Fix handrails that are broken or loose. Make sure that handrails are as long as the stairways. Check any carpeting to make sure that it is firmly attached to the stairs. Fix any carpet that is  loose or worn. Avoid having throw rugs at the top or bottom of the stairs. If you do have throw rugs, attach them to the floor with carpet tape. Make sure that you have a light switch at the top of the stairs and the bottom of the stairs. If you do not have them, ask someone to add them for you. What else can I do to help prevent falls? Wear shoes that: Do not have high heels. Have rubber bottoms. Are comfortable and fit you well. Are closed at the toe. Do not wear sandals. If you use a stepladder: Make sure that it is fully opened. Do not climb a closed stepladder. Make sure that both sides of the stepladder are locked into place. Ask  someone to hold it for you, if possible. Clearly mark and make sure that you can see: Any grab bars or handrails. First and last steps. Where the edge of each step is. Use tools that help you move around (mobility aids) if they are needed. These include: Canes. Walkers. Scooters. Crutches. Turn on the lights when you go into a dark area. Replace any light bulbs as soon as they burn out. Set up your furniture so you have a clear path. Avoid moving your furniture around. If any of your floors are uneven, fix them. If there are any pets around you, be aware of where they are. Review your medicines with your doctor. Some medicines can make you feel dizzy. This can increase your chance of falling. Ask your doctor what other things that you can do to help prevent falls. This information is not intended to replace advice given to you by your health care provider. Make sure you discuss any questions you have with your health care provider. Document Released: 11/16/2008 Document Revised: 06/28/2015 Document Reviewed: 02/24/2014 Elsevier Interactive Patient Education  2017 [employer].

## 2021-04-02 NOTE — Progress Notes (Signed)
Subjective:   Natalie [name] is a 71 y.o. female who presents for Medicare Annual (Subsequent) preventive examination.  Review of Systems      Cardiac Risk Factors include: advanced age (>29men, >22 women);obesity (BMI >30kg/m2);dyslipidemia     Objective:    Today's Vitals   04/02/21 1419  BP: 108/72  Pulse: 82  Resp: 16  Temp: 98 F (36.7 C)  TempSrc: Oral  SpO2: 99%  Weight: 160 lb 11.2 oz (72.9 kg)  Height: 5\' 5"  (1.651 m)   Body mass index is 26.74 kg/m.  Advanced Directives 04/02/2021 02/11/2021 01/31/2021 01/07/2021 12/18/2020 12/10/2020 11/29/2020  Does Patient Have a Medical Advance Directive? Yes Yes Yes Yes Yes Yes Yes  Type of Paramedic of [place];Living will - [place];Living will [place];Living will Living will;Healthcare Power of Attorney Living will;Healthcare Power of Attorney Living will;Healthcare Power of Attorney  Does patient want to make changes to medical advance directive? - No - Patient declined No - Patient declined No - Patient declined No - Guardian declined - -  Copy of Healthcare Power of Attorney in Chart? Yes - validated most recent copy scanned in chart (See row information) - Yes - validated most recent copy scanned in chart (See row information) - - Yes - validated most recent copy scanned in chart (See row information) Yes - validated most recent copy scanned in chart (See row information)  Would patient like information on creating a medical advance directive? - - - - - - -    Current Medications (verified) Outpatient Encounter Medications as of 04/02/2021  Medication Sig   atorvastatin (LIPITOR) 40 MG tablet Take 1 tablet (40 mg total) by mouth daily.   buPROPion (WELLBUTRIN SR) 150 MG 12 hr tablet Take 2 tablets (300 mg total) by mouth daily.   citalopram (CELEXA) 40 MG tablet TAKE 1 TABLET BY MOUTH EVERY DAY   clindamycin-benzoyl peroxide (BENZACLIN) gel Apply  topically 2 (two) times daily. (Patient taking differently: Apply 1 application topically 2 (two) times daily as needed (facial acne).)   clopidogrel (PLAVIX) 75 MG tablet Take 75 mg by mouth daily.   levothyroxine (SYNTHROID) 75 MCG tablet TAKE 1 TABLET(75 MCG) BY MOUTH DAILY BEFORE BREAKFAST   Multiple Vitamin (MULTIVITAMIN WITH MINERALS) TABS tablet Take 1 tablet by mouth daily.   polyvinyl alcohol (LIQUIFILM TEARS) 1.4 % ophthalmic solution Place 1 drop into both eyes daily as needed for dry eyes.   rivaroxaban (XARELTO) 20 MG TABS tablet Take 1 tablet (20 mg total) by mouth daily with supper. Resume on 12/21/2020   Ubrogepant (UBRELVY) 100 MG TABS Take 100 mg by mouth daily as needed (migraines).   Cyanocobalamin (VITAMIN B-12) 1000 MCG SUBL Place 1,000 mcg under the tongue at bedtime. (Patient not taking: Reported on 04/02/2021)   Vitamin D, Ergocalciferol, (DRISDOL) 1.25 MG (50000 UNIT) CAPS capsule Take 1 capsule (50,000 Units total) by mouth every 7 (seven) days. (Patient not taking: Reported on 04/02/2021)   No facility-administered encounter medications on file as of 04/02/2021.    Allergies (verified) Sulfamethoxazole-trimethoprim   History: Past Medical History:  Diagnosis Date   Acute bronchospasm due to viral infection 2020   Due to pollen   Allergy    Anemia    history of   CVA (cerebral vascular accident) ([place]) 02/28/2020   and 04/19/20   Depressive disorder    Dyspnea    Dysrhythmia    Endometrial cancer ([place]) 12/26/2020   Epistaxis  Fatigue    Hearing loss    History of loop recorder    Hypothyroidism    Memory change    Migraine    Obesity (BMI 30.0-34.9)    lost 60 lbs   Osteoarthritis    Osteoporosis    Polyuria    Premature menopause    Seizures ([place]) 1994   history of mini seizures, possible migraine induced   Past Surgical History:  Procedure Laterality Date   ABDOMINAL HYSTERECTOMY  12/18/2020   ABLATION  2006   BUBBLE STUDY  04/23/2020    Procedure: BUBBLE STUDY;  Surgeon: Larey Dresser, MD;  Location: [facility];  Service: Cardiovascular;;   CATARACT EXTRACTION W/ INTRAOCULAR LENS IMPLANT Bilateral    COLONOSCOPY     COLONOSCOPY WITH PROPOFOL N/A 12/22/2018   Procedure: COLONOSCOPY WITH PROPOFOL;  Surgeon: Virgel Manifold, MD;  Location: [facility];  Service: Endoscopy;  Laterality: N/A;   EYE SURGERY  Spring 2018   Cataracts both eyes   HYSTEROSCOPY WITH D & C N/A 11/01/2020   Procedure: DILATATION AND CURETTAGE /HYSTEROSCOPY WITH MYOSURE;  Surgeon: Lafonda Mosses, MD;  Location: [facility];  Service: Gynecology;  Laterality: N/A;   LAPAROTOMY  12/18/2020   Procedure: LAPAROTOMY;  Surgeon: Lafonda Mosses, MD;  Location: [facility];  Service: Gynecology;;   LOOP RECORDER INSERTION N/A 04/23/2020   Procedure: LOOP RECORDER INSERTION;  Surgeon: Evans Lance, MD;  Location: [facility];  Service: Cardiovascular;  Laterality: N/A;   ROBOTIC ASSISTED TOTAL HYSTERECTOMY WITH BILATERAL SALPINGO OOPHERECTOMY Bilateral 12/18/2020   Procedure: XI ROBOTIC ASSISTED TOTAL HYSTERECTOMY WITH BILATERAL SALPINGO OOPHORECTOMY;  Surgeon: Lafonda Mosses, MD;  [facility];  Service: Gynecology;  Laterality: Bilateral;   SENTINEL NODE BIOPSY N/A 12/18/2020   Procedure: SENTINEL NODE BIOPSY;  Surgeon: Lafonda Mosses, MD;  [facility];  Service: Gynecology;  Laterality: N/A;   TEE WITHOUT CARDIOVERSION N/A 04/23/2020   Procedure: TRANSESOPHAGEAL ECHOCARDIOGRAM (TEE);  Surgeon: Larey Dresser, MD;  Location: [facility];  Service: Cardiovascular;  Laterality: N/A;   TONSILLECTOMY     Family History  Problem Relation Age of Onset   Early death Father        suicide   Depression Father    Diabetes Father    Cancer Father        prostate   Hearing loss Father        due to war   Alzheimer's disease Mother    Heart disease Brother    Atrial fibrillation Brother    Heart disease Maternal Aunt     Dementia Maternal Aunt    Heart disease Maternal Uncle    Dementia Maternal Grandmother    Heart disease Brother    Atrial fibrillation Brother    Colon cancer Neg Hx    Breast cancer Neg Hx    Ovarian cancer Neg Hx    Pancreatic cancer Neg Hx    Endometrial cancer Neg Hx    Social History   Socioeconomic History   Marital status: Single    Spouse name: Not on file   Number of children: 1   Years of education: Not on file   Highest education level: Bachelor's degree (e.g., BA, AB, BS)  Occupational History   Occupation: retired   Tobacco Use   Smoking status: Former    Packs/day: 0.50    Years: 6.00    Pack years: 3.00    Types: Cigarettes    Quit date:  02/11/1974    Years since quitting: 47.1   Smokeless tobacco: Never  Vaping Use   Vaping Use: Never used  Substance and Sexual Activity   Alcohol use: Not Currently   Drug use: Never   Sexual activity: Not Currently    Comment: Dont use not sexually active  Other Topics Concern   Not on file  Social History Narrative   Raised an adopted child on her own   Working part time reviewed documents   Social Determinants of Radio broadcast assistant Strain: Low Risk    Difficulty of Paying Living Expenses: Not hard at all  Food Insecurity: No Food Insecurity   Worried About Charity fundraiser in the Last Year: Never true   Arboriculturist in the Last Year: Never true  Transportation Needs: No Transportation Needs   Lack of Transportation (Medical): No   Lack of Transportation (Non-Medical): No  Physical Activity: Insufficiently Active   Days of Exercise per Week: 2 days   Minutes of Exercise per Session: 30 min  Stress: No Stress Concern Present   Feeling of Stress : Not at all  Social Connections: Moderately Integrated   Frequency of Communication with Friends and Family: More than three times a week   Frequency of Social Gatherings with Friends and Family: Twice a week   Attends Religious Services: More than 4  times per year   Active Member of [employer] or Organizations: Yes   Attends Music therapist: More than 4 times per year   Marital Status: Never married    Tobacco Counseling Counseling given: Not Answered   Clinical Intake:  Pre-visit preparation completed: Yes  Pain : No/denies pain     Nutritional Risks: None Diabetes: No CBG done?: No Did pt. bring in CBG monitor from home?: No  How often do you need to have someone help you when you read instructions, pamphlets, or other written materials from your doctor or pharmacy?: 1 - Never    Interpreter Needed?: No  Information entered by :: Clemetine Marker LPN   Activities of Daily Living In your present state of health, do you have any difficulty performing the following activities: 04/02/2021 03/13/2021  Hearing? Tempie Donning  Vision? N N  Difficulty concentrating or making decisions? N N  Comment - -  Walking or climbing stairs? N N  Comment - -  Dressing or bathing? N N  Doing errands, shopping? N N  Preparing Food and eating ? N -  Using the Toilet? N -  In the past six months, have you accidently leaked urine? Y -  Do you have problems with loss of bowel control? N -  Managing your Medications? N -  Managing your Finances? N -  Housekeeping or managing your Housekeeping? N -  Some recent data might be hidden    Patient Care Team: [name], MD as PCP - General (Family Medicine) Gery Pray, MD as Consulting Physician (Radiation Oncology) Vladimir Crofts, MD as Consulting Physician (Neurology) Yolonda Kida, MD as Consulting Physician (Cardiology) Lafonda Mosses, MD as Consulting Physician (Gynecologic Oncology)  Indicate any recent Medical Services you may have received from other than [facility] providers in the past year (date may be approximate).     Assessment:   This is a routine wellness examination for [place].  Hearing/Vision screen Hearing Screening - Comments:: Pt c/o mild hearing  loss with higher ranges but does not need hearing aids yet Vision Screening -  Comments:: Vision screenings done at [place] issues and exercise activities discussed: Current Exercise Habits: Home exercise routine, Type of exercise: walking, Time (Minutes): 30, Frequency (Times/Week): 2, Weekly Exercise (Minutes/Week): 60, Intensity: Mild, Exercise limited by: None identified   Goals Addressed   None    Depression Screen PHQ 2/9 Scores 04/02/2021 03/13/2021 12/11/2020 08/16/2020 05/11/2020 03/08/2020 01/25/2020  PHQ - 2 Score 0 0 0 0 0 0 0  PHQ- 9 Score 2 0 2 0 2 2 2   Exception Documentation - - - - - - -  Not completed - - - - - - -    Fall Risk Fall Risk  04/02/2021 03/13/2021 12/11/2020 08/16/2020 05/11/2020  Falls in the past year? 1 1 1 1  0  Number falls in past yr: 1 1 1  0 0  Injury with Fall? 1 1 0 1 0  Comment - - - - -  Risk for fall due to : No Fall Risks No Fall Risks Impaired balance/gait - -  Risk for fall due to: Comment - - - - -  Follow up Falls prevention discussed Falls prevention discussed Falls prevention discussed - -    FALL RISK PREVENTION PERTAINING TO THE HOME:  Any stairs in or around the home? Yes  If so, are there any without handrails? No  Home free of loose throw rugs in walkways, pet beds, electrical cords, etc? Yes  Adequate lighting in your home to reduce risk of falls? Yes   ASSISTIVE DEVICES UTILIZED TO PREVENT FALLS:  Life alert? No  Use of a cane, walker or w/c? No  Grab bars in the bathroom? No  Shower chair or bench in shower? Yes  Elevated toilet seat or a handicapped toilet? Yes   TIMED UP AND GO:  Was the test performed? Yes .  Length of time to ambulate 10 feet: 5 sec.   Gait steady and fast without use of assistive device  Cognitive Function: Normal cognitive status assessed by direct observation by this Nurse Health Advisor. No abnormalities found.       6CIT Screen 12/20/2019 11/18/2018  What Year? 0 points 0  points  What month? 0 points 0 points  What time? 0 points 0 points  Count back from 20 0 points 0 points  Months in reverse 0 points 0 points  Repeat phrase 0 points 0 points  Total Score 0 0    Immunizations Immunization History  Administered Date(s) Administered   Fluad Quad(high Dose 65+) 11/16/2018, 11/09/2019   Influenza, High Dose Seasonal PF 11/02/2015, 11/03/2016, 11/24/2017   Influenza,inj,Quad PF,6+ Mos 10/26/2014   Moderna Covid-19 Vaccine Bivalent Booster 63yrs & up 02/28/2021   PFIZER(Purple Top)SARS-COV-2 Vaccination 03/15/2019, 04/05/2019, 11/28/2019   Pneumococcal Conjugate-13 11/03/2016   Pneumococcal Polysaccharide-23 06/28/2015   Tdap 12/20/2012    TDAP status: Up to date  Flu Vaccine status: Declined, Education has been provided regarding the importance of this vaccine but patient still declined. Advised may receive this vaccine at local pharmacy or Health Dept. Aware to provide a copy of the vaccination record if obtained from local pharmacy or Health Dept. Verbalized acceptance and understanding.  Pneumococcal vaccine status: Up to date  Covid-19 vaccine status: Completed vaccines  Qualifies for Shingles Vaccine? Yes   Zostavax completed No   Shingrix Completed?: No.    Education has been provided regarding the importance of this vaccine. Patient has been advised to call insurance company to determine out of pocket expense if they have not  yet received this vaccine. Advised may also receive vaccine at local pharmacy or Health Dept. Verbalized acceptance and understanding.  Screening Tests Health Maintenance  Topic Date Due   Zoster Vaccines- Shingrix (1 of 2) Never done   MAMMOGRAM  07/23/2017   INFLUENZA VACCINE  09/03/2020   PAP SMEAR-Modifier  05/11/2021   COLONOSCOPY (Pts 45-32yrs Insurance coverage will need to be confirmed)  12/21/2021   TETANUS/TDAP  12/21/2022   Pneumonia Vaccine 22+ Years old  Completed   DEXA SCAN  Completed   COVID-19  Vaccine  Completed   Hepatitis C Screening  Completed   HPV VACCINES  Aged Out    Health Maintenance  Health Maintenance Due  Topic Date Due   Zoster Vaccines- Shingrix (1 of 2) Never done   MAMMOGRAM  07/23/2017   INFLUENZA VACCINE  09/03/2020   PAP SMEAR-Modifier  05/11/2021    Colorectal cancer screening: Type of screening: Colonoscopy. Completed 12/22/18. Repeat every 3 years  Mammogram status: Completed 07/24/15. Repeat every year. Ordered today.   Bone Density status: Completed 07/24/15. Results reflect: Bone density results: OSTEOPOROSIS. Repeat every 2 years. Ordered today.   Lung Cancer Screening: (Low Dose CT Chest recommended if Age 53-80 years, 30 pack-year currently smoking OR have quit w/in 15years.) does not qualify.   Additional Screening:  Hepatitis C Screening: does qualify; Completed 06/28/15  Vision Screening: Recommended annual ophthalmology exams for early detection of glaucoma and other disorders of the eye. Is the patient up to date with their annual eye exam?  Yes  Who is the provider or what is the name of the office in which the patient attends annual eye exams? [facility].   Dental Screening: Recommended annual dental exams for proper oral hygiene  Community Resource Referral / Chronic Care Management: CRR required this visit?  No   CCM required this visit?  No      Plan:     I have personally reviewed and noted the following in the patients chart:   Medical and social history Use of alcohol, tobacco or illicit drugs  Current medications and supplements including opioid prescriptions.  Functional ability and status Nutritional status Physical activity Advanced directives List of other physicians Hospitalizations, surgeries, and ER visits in previous 12 months Vitals Screenings to include cognitive, depression, and falls Referrals and appointments  In addition, I have reviewed and discussed with patient certain preventive  protocols, quality metrics, and best practice recommendations. A written personalized care plan for preventive services as well as general preventive health recommendations were provided to patient.     Clemetine Marker, LPN   5/00/9381   Nurse Notes: none

## 2021-04-05 ENCOUNTER — Telehealth: Payer: Self-pay | Admitting: Family Medicine

## 2021-04-05 NOTE — Telephone Encounter (Signed)
Pt was informed that we do not have any samples of this medication she was requesting. ?

## 2021-04-05 NOTE — Telephone Encounter (Signed)
Pt was apart of a program that was helping pt with cost assistance for rivaroxaban (XARELTO) 20 MG TABS tablet  that are over $500 a month / pt is usually prescribed this by Dr. Manuella Ghazi but they dont have any samples / pt was advised by the [facility] program to ask her PCP if they have any samples to give her to last until the program reinstates in April/ pt will be out of medication on Monday and needs enough for about 30 days / please advise  ?

## 2021-04-11 ENCOUNTER — Encounter: Payer: Self-pay | Admitting: Radiation Oncology

## 2021-04-12 ENCOUNTER — Telehealth: Payer: Self-pay | Admitting: *Deleted

## 2021-04-12 NOTE — Telephone Encounter (Signed)
CALLED PATIENT TO ASK ABOUT RESCHEDULING FU ON 04-18-21 DUE TO DR. KINARD BEING IN THE OR,  SPOKE WITH PATIENT AND SHE AGREED TO COME ON 04-25-21 @ 11:30 AM ?

## 2021-04-18 ENCOUNTER — Ambulatory Visit: Payer: Self-pay | Admitting: Radiation Oncology

## 2021-04-24 ENCOUNTER — Telehealth: Payer: Self-pay | Admitting: *Deleted

## 2021-04-24 NOTE — Telephone Encounter (Signed)
CALLED PATIENT TO RESCHEDULE FU ON 04-25-21 DUE TO DR. KINARD BEING IN THE OR, RESCHEDULED FOR 05-23-21 @ 9:15 AM, LVM FOR A RETURN CALL ?

## 2021-04-25 ENCOUNTER — Ambulatory Visit: Payer: Medicare Other | Admitting: Radiation Oncology

## 2021-05-14 ENCOUNTER — Encounter: Payer: Self-pay | Admitting: Family Medicine

## 2021-05-14 DIAGNOSIS — Z8673 Personal history of transient ischemic attack (TIA), and cerebral infarction without residual deficits: Secondary | ICD-10-CM | POA: Diagnosis not present

## 2021-05-14 DIAGNOSIS — R9431 Abnormal electrocardiogram [ECG] [EKG]: Secondary | ICD-10-CM | POA: Diagnosis not present

## 2021-05-14 DIAGNOSIS — I10 Essential (primary) hypertension: Secondary | ICD-10-CM | POA: Diagnosis not present

## 2021-05-14 DIAGNOSIS — E785 Hyperlipidemia, unspecified: Secondary | ICD-10-CM | POA: Diagnosis not present

## 2021-05-14 DIAGNOSIS — R5382 Chronic fatigue, unspecified: Secondary | ICD-10-CM | POA: Diagnosis not present

## 2021-05-14 DIAGNOSIS — R2681 Unsteadiness on feet: Secondary | ICD-10-CM | POA: Diagnosis not present

## 2021-05-16 DIAGNOSIS — E785 Hyperlipidemia, unspecified: Secondary | ICD-10-CM | POA: Insufficient documentation

## 2021-05-22 DIAGNOSIS — Z20822 Contact with and (suspected) exposure to covid-19: Secondary | ICD-10-CM | POA: Diagnosis not present

## 2021-05-22 NOTE — Progress Notes (Signed)
?Radiation Oncology         [phone] ?________________________________ ? ?Name: Natalie [name] MRN: [id number]  ?Date: 05/23/2021  DOB: Jun 22, 1950 ? ?Follow-Up Visit Note ? ?CC: [name], MD  Lafonda Mosses, MD ? ?  ICD-10-CM   ?1. Endometrial cancer ([place])  C54.1   ?  ? ? ?Diagnosis:  The encounter diagnosis was Endometrial cancer ([place]). ?  ?Stage 1A grade 1 endometrioid endometrial adenocarcinoma ? ?Interval Since Last Radiation: 2 months and 11 days  ? ?Intent: Curative ? ?Radiation Treatment Dates: 02/11/2021 through 03/14/2021 ?Site Technique Total Dose (Gy) Dose per Fx (Gy) Completed Fx Beam Energies  ?Vagina: Pelvis HDR-brachy 30/30 6 5/5 Ir-192  ? ? ?Narrative:  The patient returns today for routine follow-up. Overall she tolerated brachytherapy well with minimal side effects. On the date of her final treatment, the only symptom she endorsed was mild fatigue.   ? ?Otherwise, no significant oncologic history since the patient was last seen for follow up.      ? ?She reports feeling the best she has for some time.  She is considering big trip to [place] in a few months.                          ? ?Allergies:  is allergic to sulfamethoxazole-trimethoprim. ? ?Meds: ?Current Outpatient Medications  ?Medication Sig Dispense Refill  ? aspirin 81 MG EC tablet Take by mouth.    ? atorvastatin (LIPITOR) 40 MG tablet Take 1 tablet (40 mg total) by mouth daily. 90 tablet 1  ? buPROPion (WELLBUTRIN SR) 150 MG 12 hr tablet Take 2 tablets (300 mg total) by mouth daily. 180 tablet 0  ? citalopram (CELEXA) 40 MG tablet TAKE 1 TABLET BY MOUTH EVERY DAY 90 tablet 1  ? clindamycin-benzoyl peroxide (BENZACLIN) gel Apply topically 2 (two) times daily. (Patient taking differently: Apply 1 application. topically 2 (two) times daily as needed (facial acne).) 50 g 0  ? clopidogrel (PLAVIX) 75 MG tablet Take 75 mg by mouth daily.    ? Cyanocobalamin (VITAMIN B-12) 1000 MCG SUBL Place 1,000 mcg under the tongue at bedtime.     ? levothyroxine (SYNTHROID) 75 MCG tablet TAKE 1 TABLET(75 MCG) BY MOUTH DAILY BEFORE BREAKFAST 90 tablet 1  ? Multiple Vitamin (MULTIVITAMIN WITH MINERALS) TABS tablet Take 1 tablet by mouth daily.    ? polyvinyl alcohol (LIQUIFILM TEARS) 1.4 % ophthalmic solution Place 1 drop into both eyes daily as needed for dry eyes.    ? Ubrogepant (UBRELVY) 100 MG TABS Take 100 mg by mouth daily as needed (migraines).    ? Vitamin D, Ergocalciferol, (DRISDOL) 1.25 MG (50000 UNIT) CAPS capsule Take 1 capsule (50,000 Units total) by mouth every 7 (seven) days. 12 capsule 1  ? rivaroxaban (XARELTO) 20 MG TABS tablet Take 1 tablet (20 mg total) by mouth daily with supper. Resume on 12/21/2020 30 tablet 0  ? ?No current facility-administered medications for this encounter.  ? ? ?Physical Findings: ?The patient is in no acute distress. Patient is alert and oriented. ? height is '5\' 5"'$  (1.651 m) and weight is 156 lb 6.4 oz (70.9 kg). Her temperature is 97.8 ?F (36.6 ?C). Her blood pressure is 112/76 and her pulse is 66. Her respiration is 20 and oxygen saturation is 100%. .  No significant changes. Lungs are clear to auscultation bilaterally. Heart has regular rate and rhythm. No palpable cervical, supraclavicular, or axillary adenopathy. Abdomen soft, non-tender,  normal bowel sounds.  Pelvic exam deferred in light of recent completion of treatment. ? ? ?Lab Findings: ?Lab Results  ?Component Value Date  ? WBC 3.4 (L) 03/13/2021  ? HGB 11.4 (L) 03/13/2021  ? HCT 36.4 03/13/2021  ? MCV 86.5 03/13/2021  ? PLT 217 03/13/2021  ? ? ?Radiographic Findings: ?No results found. ? ?Impression: The encounter diagnosis was Endometrial cancer ([place]). ?  ?Stage 1A grade 1 endometrioid endometrial adenocarcinoma ? ? ?She tolerated her vaginal brachytherapy well without any significant side effects during or after treatment. ? ?Plan: The patient was given a vaginal dilator and instructions on its use.  The patient will follow-up with Dr. Berline Lopes in  3 months and radiation oncology in 6 months. ? ? ?____________________________________ ? ?Blair Promise, PhD, MD ? ?This document serves as a record of services personally performed by Gery Pray, MD. It was created on his behalf by Roney Mans, a trained medical scribe. The creation of this record is based on the scribe's personal observations and the provider's statements to them. This document has been checked and approved by the attending provider. ? ?

## 2021-05-23 ENCOUNTER — Other Ambulatory Visit: Payer: Self-pay

## 2021-05-23 ENCOUNTER — Ambulatory Visit
Admission: RE | Admit: 2021-05-23 | Discharge: 2021-05-23 | Disposition: A | Discharge status: Home / Self Care | Payer: Medicare Other | Source: Ambulatory Visit | Attending: Radiation Oncology | Admitting: Radiation Oncology

## 2021-05-23 ENCOUNTER — Encounter: Payer: Self-pay | Admitting: Radiation Oncology

## 2021-05-23 VITALS — BP 112/76 | HR 66 | Temp 97.8°F | Resp 20 | Ht 65.0 in | Wt 156.4 lb

## 2021-05-23 DIAGNOSIS — C541 Malignant neoplasm of endometrium: Secondary | ICD-10-CM | POA: Diagnosis not present

## 2021-05-23 DIAGNOSIS — Z923 Personal history of irradiation: Secondary | ICD-10-CM | POA: Diagnosis not present

## 2021-05-23 DIAGNOSIS — Z79899 Other long term (current) drug therapy: Secondary | ICD-10-CM | POA: Insufficient documentation

## 2021-05-23 DIAGNOSIS — R5383 Other fatigue: Secondary | ICD-10-CM | POA: Diagnosis not present

## 2021-05-23 NOTE — Progress Notes (Signed)
Natalie [name] is here today for follow up post radiation to the pelvic. ? ?They completed their radiation on: 03/14/2021  ? ?Does the patient complain of any of the following: ? ?Pain:denies ?Abdominal bloating: denies ?Diarrhea/Constipation: constipation, occasional diarrhea ?Nausea/Vomiting: denies ?Vaginal Discharge: denies ?Blood in Urine or Stool: bleeding with irritated hemorrhoids ?Urinary Issues (dysuria/incomplete emptying/ incontinence/ increased frequency/urgency): nocturia x0-1, frequency, occasional urgency ?Does patient report using vaginal dilator 2-3 times a week and/or sexually active 2-3 weeks: vaginal dilator and education provided at this visit ?Post radiation skin changes: denies ? ? ?Additional comments if applicable: nothing of note ? ?Vitals:  ? 05/23/21 0924  ?BP: 112/76  ?Pulse: 66  ?Resp: 20  ?Temp: 97.8 ?F (36.6 ?C)  ?SpO2: 100%  ?Weight: 156 lb 6.4 oz (70.9 kg)  ?Height: '5\' 5"'$  (1.651 m)  ? ? ?

## 2021-06-05 DIAGNOSIS — Z8673 Personal history of transient ischemic attack (TIA), and cerebral infarction without residual deficits: Secondary | ICD-10-CM | POA: Diagnosis not present

## 2021-06-17 NOTE — Progress Notes (Signed)
Name: Natalie [name]   MRN: [id number]    DOB: October 21, 1950   Date:06/18/2021 ? ?     Progress Note ? ?Subjective ? ?Chief Complaint ? ?Mental status consult ? ?HPI ? ?Anemia unspecified: last labs had improved, we will recheck prior to next visit. She lost blood during surgeries last year and post-menopausal bleeding.  ? ?MDD: she  was under Dr. Leonette Most care for many years but he is retiring and she asked me to prescribe her medications from now on. she takes medications as prescribed. She completes endometrial cancer treatment. She is feeling better emotionally, going back to church, walking with a friend. She is going to [place] with a friend this Summer.  ? ?B12 deficiency: last level was high and was advised to stop supplementation. She resumed supplementation on her own, advised to only take supplements twice a week until follow up and we will recheck labs.  ? ?Possible Cognitive dysfunctions: she has a normal MMS test done today, however has been repeating same questions to loved ones. She sees Dr. Manuella Ghazi and she is really worried because of her family history of Alzheimer's , she will re-schedule a visit with Dr. Manuella Ghazi to discuss possibility of medication ? ?Hearing loss: she states getting progressively worse and ready to get hearing aids ? ?Patient Active Problem List  ? Diagnosis Date Noted  ? Dyslipidemia 05/16/2021  ? Major depression, recurrent, chronic ([place]) 03/13/2021  ? Mild protein-calorie malnutrition ([place]) 03/13/2021  ? Endometrial cancer ([place]) 12/26/2020  ? Postoperative anemia due to acute blood loss 12/19/2020  ? History of CVA in adulthood 04/19/2020  ? History of ischemic multifocal posterior circulation stroke 02/29/2020  ? Difficulty hearing 06/28/2015  ? Arthritis, degenerative 06/28/2015  ? Paresthesia 06/28/2015  ? Purpura, nonthrombopenic ([place]) 06/28/2015  ? Adult hypothyroidism 08/09/2014  ? Migraine without aura and without status migrainosus, not intractable 09/19/2008  ? Moderate major  depression ([place]) 09/07/2006  ? OP (osteoporosis) 09/07/2006  ? ? ?Past Surgical History:  ?Procedure Laterality Date  ? ABDOMINAL HYSTERECTOMY  12/18/2020  ? ABLATION  2006  ? BUBBLE STUDY  04/23/2020  ? Procedure: BUBBLE STUDY;  Surgeon: Larey Dresser, MD;  Location: [facility] ENDOSCOPY;  Service: Cardiovascular;;  ? CATARACT EXTRACTION W/ INTRAOCULAR LENS IMPLANT Bilateral   ? COLONOSCOPY    ? COLONOSCOPY WITH PROPOFOL N/A 12/22/2018  ? Procedure: COLONOSCOPY WITH PROPOFOL;  Surgeon: Virgel Manifold, MD;  Location: [facility] ENDOSCOPY;  Service: Endoscopy;  Laterality: N/A;  ? EYE SURGERY  Spring 2018  ? Cataracts both eyes  ? HYSTEROSCOPY WITH D & C N/A 11/01/2020  ? Procedure: DILATATION AND CURETTAGE /HYSTEROSCOPY WITH MYOSURE;  Surgeon: Lafonda Mosses, MD;  Location: [facility] ORS;  Service: Gynecology;  Laterality: N/A;  ? LAPAROTOMY  12/18/2020  ? Procedure: LAPAROTOMY;  Surgeon: Lafonda Mosses, MD;  Location: [facility] OR[facility];  Service: Gynecology;;  ? LOOP RECORDER INSERTION N/A 04/23/2020  ? Procedure: LOOP RECORDER INSERTION;  Surgeon: Evans Lance, MD;  Location: [facility];  Service: Cardiovascular;  Laterality: N/A;  ? ROBOTIC ASSISTED TOTAL HYSTERECTOMY WITH BILATERAL SALPINGO OOPHERECTOMY Bilateral 12/18/2020  ? Procedure: XI ROBOTIC ASSISTED TOTAL HYSTERECTOMY WITH BILATERAL SALPINGO OOPHORECTOMY;  Surgeon: Lafonda Mosses, MD;  Location: [facility];  Service: Gynecology;  Laterality: Bilateral;  ? SENTINEL NODE BIOPSY N/A 12/18/2020  ? Procedure: SENTINEL NODE BIOPSY;  Surgeon: Lafonda Mosses, MD;  Location: [facility];  Service: Gynecology;  Laterality: N/A;  ? TEE WITHOUT CARDIOVERSION N/A 04/23/2020  ?  Procedure: TRANSESOPHAGEAL ECHOCARDIOGRAM (TEE);  Surgeon: Larey Dresser, MD;  Location: [facility] ENDOSCOPY;  Service: Cardiovascular;  Laterality: N/A;  ? TONSILLECTOMY    ? ? ?Family History  ?Problem Relation Age of Onset  ? Early death Father   ?     suicide  ? Depression Father   ? Diabetes  Father   ? Cancer Father   ?     prostate  ? Hearing loss Father   ?     due to war  ? Alzheimer's disease Mother   ? Heart disease Brother   ? Atrial fibrillation Brother   ? Heart disease Maternal Aunt   ? Dementia Maternal Aunt   ? Heart disease Maternal Uncle   ? Dementia Maternal Grandmother   ? Heart disease Brother   ? Atrial fibrillation Brother   ? Colon cancer Neg Hx   ? Breast cancer Neg Hx   ? Ovarian cancer Neg Hx   ? Pancreatic cancer Neg Hx   ? Endometrial cancer Neg Hx   ? ? ?Social History  ? ?Tobacco Use  ? Smoking status: Former  ?  Packs/day: 0.50  ?  Years: 6.00  ?  Pack years: 3.00  ?  Types: Cigarettes  ?  Quit date: 02/11/1974  ?  Years since quitting: 47.3  ? Smokeless tobacco: Never  ?Substance Use Topics  ? Alcohol use: Not Currently  ? ? ? ?Current Outpatient Medications:  ?  aspirin 81 MG EC tablet, Take by mouth., Disp: , Rfl:  ?  atorvastatin (LIPITOR) 40 MG tablet, Take 1 tablet (40 mg total) by mouth daily., Disp: 90 tablet, Rfl: 1 ?  citalopram (CELEXA) 40 MG tablet, TAKE 1 TABLET BY MOUTH EVERY DAY, Disp: 90 tablet, Rfl: 1 ?  clindamycin-benzoyl peroxide (BENZACLIN) gel, Apply topically 2 (two) times daily. (Patient taking differently: Apply 1 application. topically 2 (two) times daily as needed (facial acne).), Disp: 50 g, Rfl: 0 ?  clopidogrel (PLAVIX) 75 MG tablet, Take 75 mg by mouth daily., Disp: , Rfl:  ?  levothyroxine (SYNTHROID) 75 MCG tablet, TAKE 1 TABLET(75 MCG) BY MOUTH DAILY BEFORE BREAKFAST, Disp: 90 tablet, Rfl: 1 ?  Multiple Vitamin (MULTIVITAMIN WITH MINERALS) TABS tablet, Take 1 tablet by mouth daily., Disp: , Rfl:  ?  polyvinyl alcohol (LIQUIFILM TEARS) 1.4 % ophthalmic solution, Place 1 drop into both eyes daily as needed for dry eyes., Disp: , Rfl:  ?  Ubrogepant (UBRELVY) 100 MG TABS, Take 100 mg by mouth daily as needed (migraines)., Disp: , Rfl:  ?  buPROPion (WELLBUTRIN SR) 150 MG 12 hr tablet, Take 2 tablets (300 mg total) by mouth daily., Disp: 180 tablet,  Rfl: 1 ?  [START ON 06/20/2021] Cyanocobalamin (VITAMIN B-12) 1000 MCG SUBL, Place 1 tablet (1,000 mcg total) under the tongue 2 (two) times a week., Disp: 30 tablet, Rfl: 0 ?  Vitamin D, Ergocalciferol, (DRISDOL) 1.25 MG (50000 UNIT) CAPS capsule, Take 1 capsule (50,000 Units total) by mouth every 14 (fourteen) days., Disp: 6 capsule, Rfl: 1 ? ?Allergies  ?Allergen Reactions  ? Sulfamethoxazole-Trimethoprim Hives  ? ? ?I personally reviewed active problem list, medication list, allergies, family history, social history with the patient/caregiver today. ? ? ?ROS ? ?Constitutional: Negative for fever or weight change.  ?Respiratory: Negative for cough and shortness of breath.   ?Cardiovascular: Negative for chest pain or palpitations.  ?Gastrointestinal: Negative for abdominal pain, no bowel changes.  ?Musculoskeletal: Negative for gait problem or joint swelling.  ?Skin:  Negative for rash.  ?Neurological: Negative for dizziness or headache.  ?No other specific complaints in a complete review of systems (except as listed in HPI above).  ? ?Objective ? ?Vitals:  ? 06/18/21 1017  ?BP: 112/72  ?Pulse: 76  ?Resp: 14  ?Temp: 98.3 ?F (36.8 ?C)  ?TempSrc: Oral  ?SpO2: 98%  ?Weight: 160 lb 1.6 oz (72.6 kg)  ?Height: '5\' 5"'$  (1.651 m)  ? ? ?Body mass index is 26.64 kg/m?. ? ?Physical Exam ? ?Constitutional: Patient appears well-developed and well-nourished.  No distress.  ?HEENT: head atraumatic, normocephalic, pupils equal and reactive to light, neck supple ?Cardiovascular: Normal rate, regular rhythm and normal heart sounds.  No murmur heard. No BLE edema. ?Pulmonary/Chest: Effort normal and breath sounds normal. No respiratory distress. ?Abdominal: Soft.  There is no tenderness. ?Psychiatric: Patient has a normal mood and affect. behavior is normal. Judgment and thought content normal.  ? ? ?PHQ2/9: ? ?  06/18/2021  ? 10:22 AM 04/02/2021  ?  2:25 PM 03/13/2021  ? 10:11 AM 12/11/2020  ? 11:39 AM 08/16/2020  ? 10:00 AM  ?Depression  screen PHQ 2/9  ?Decreased Interest 0 0 0 0 0  ?Down, Depressed, Hopeless 0 0 0 0 0  ?PHQ - 2 Score 0 0 0 0 0  ?Altered sleeping 1 0 0 1 0  ?Tired, decreased energy 1 1 0 1 0  ?Change in appetite 1 1 0 0 0

## 2021-06-18 ENCOUNTER — Telehealth: Payer: Self-pay

## 2021-06-18 ENCOUNTER — Ambulatory Visit (INDEPENDENT_AMBULATORY_CARE_PROVIDER_SITE_OTHER): Payer: Medicare Other | Admitting: Family Medicine

## 2021-06-18 ENCOUNTER — Encounter: Payer: Self-pay | Admitting: Family Medicine

## 2021-06-18 VITALS — BP 112/72 | HR 76 | Temp 98.3°F | Resp 14 | Ht 65.0 in | Wt 160.1 lb

## 2021-06-18 DIAGNOSIS — E538 Deficiency of other specified B group vitamins: Secondary | ICD-10-CM | POA: Diagnosis not present

## 2021-06-18 DIAGNOSIS — Z8673 Personal history of transient ischemic attack (TIA), and cerebral infarction without residual deficits: Secondary | ICD-10-CM | POA: Diagnosis not present

## 2021-06-18 DIAGNOSIS — R413 Other amnesia: Secondary | ICD-10-CM

## 2021-06-18 DIAGNOSIS — H9193 Unspecified hearing loss, bilateral: Secondary | ICD-10-CM | POA: Diagnosis not present

## 2021-06-18 DIAGNOSIS — Z820 Family history of epilepsy and other diseases of the nervous system: Secondary | ICD-10-CM

## 2021-06-18 DIAGNOSIS — E559 Vitamin D deficiency, unspecified: Secondary | ICD-10-CM

## 2021-06-18 DIAGNOSIS — F339 Major depressive disorder, recurrent, unspecified: Secondary | ICD-10-CM | POA: Diagnosis not present

## 2021-06-18 DIAGNOSIS — D62 Acute posthemorrhagic anemia: Secondary | ICD-10-CM

## 2021-06-18 MED ORDER — BUPROPION HCL ER (SR) 150 MG PO TB12: 300.0000 mg | ORAL_TABLET | Freq: Every day | ORAL | 1 refills | Status: DC

## 2021-06-18 MED ORDER — VITAMIN B-12 1000 MCG SL SUBL: 1000.0000 ug | SUBLINGUAL_TABLET | SUBLINGUAL | 0 refills | Status: AC

## 2021-06-18 MED ORDER — VITAMIN D (ERGOCALCIFEROL) 1.25 MG (50000 UNIT) PO CAPS: 50000.0000 [IU] | ORAL_CAPSULE | ORAL | 1 refills | Status: DC

## 2021-06-18 NOTE — Telephone Encounter (Signed)
Pt was checking out from her appt and she forgot to tell you to do a referral to the PT that she saw after her stroke at the hospital. She thanks you.  ?

## 2021-06-20 ENCOUNTER — Telehealth: Payer: Self-pay | Admitting: *Deleted

## 2021-06-20 NOTE — Telephone Encounter (Signed)
CALLED PATIENT TO INFORM OF FU APPT. WITH DR. Berline Lopes ON 08-05-21 - ARRIVAL TIME- 2:45 PM, LVM FOR A RETURN CALL

## 2021-07-10 ENCOUNTER — Ambulatory Visit
Admission: RE | Admit: 2021-07-10 | Discharge: 2021-07-10 | Disposition: A | Discharge status: Home / Self Care | Payer: Medicare Other | Source: Ambulatory Visit | Attending: Family Medicine | Admitting: Family Medicine

## 2021-07-10 DIAGNOSIS — Z1231 Encounter for screening mammogram for malignant neoplasm of breast: Secondary | ICD-10-CM | POA: Insufficient documentation

## 2021-07-10 DIAGNOSIS — Z780 Asymptomatic menopausal state: Secondary | ICD-10-CM

## 2021-07-10 DIAGNOSIS — M810 Age-related osteoporosis without current pathological fracture: Secondary | ICD-10-CM | POA: Diagnosis not present

## 2021-07-10 IMAGING — MG MM DIGITAL SCREENING BILAT W/ TOMO AND CAD
8 of 14 series · 8 of 40 positions shown · non-contrast
Comparison: Previous exam(s).

CLINICAL DATA: Screening.

EXAM:
DIGITAL SCREENING BILATERAL MAMMOGRAM WITH TOMOSYNTHESIS AND CAD
TECHNIQUE: Bilateral screening digital craniocaudal and mediolateral oblique
mammograms were obtained. Bilateral screening digital breast
tomosynthesis was performed. The images were evaluated with
computer-aided detection.

[R MLO synth-2D (1 of 2)]
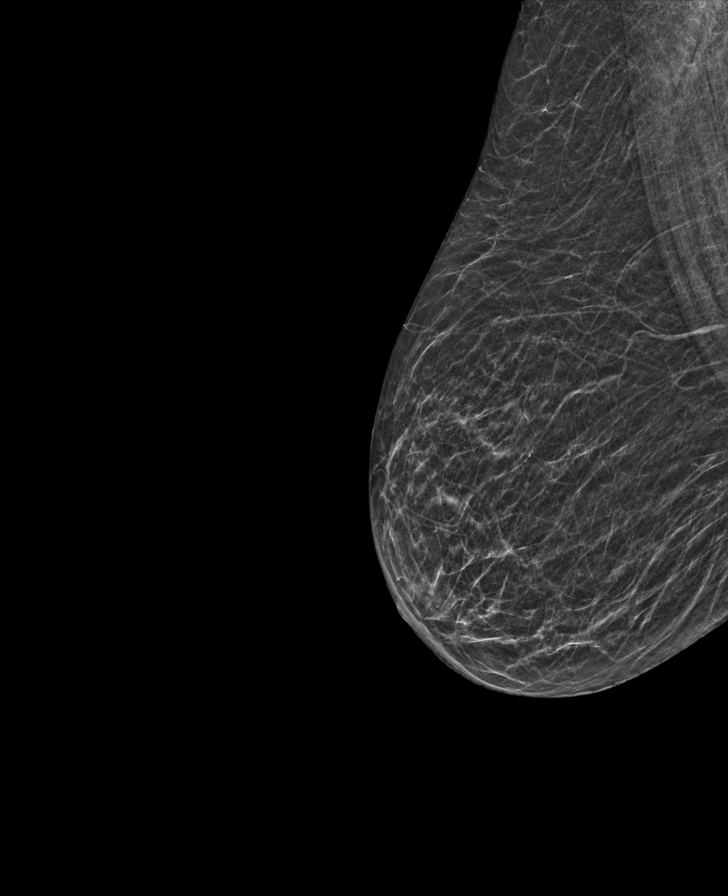

[L MLO synth-2D (1 of 2)]
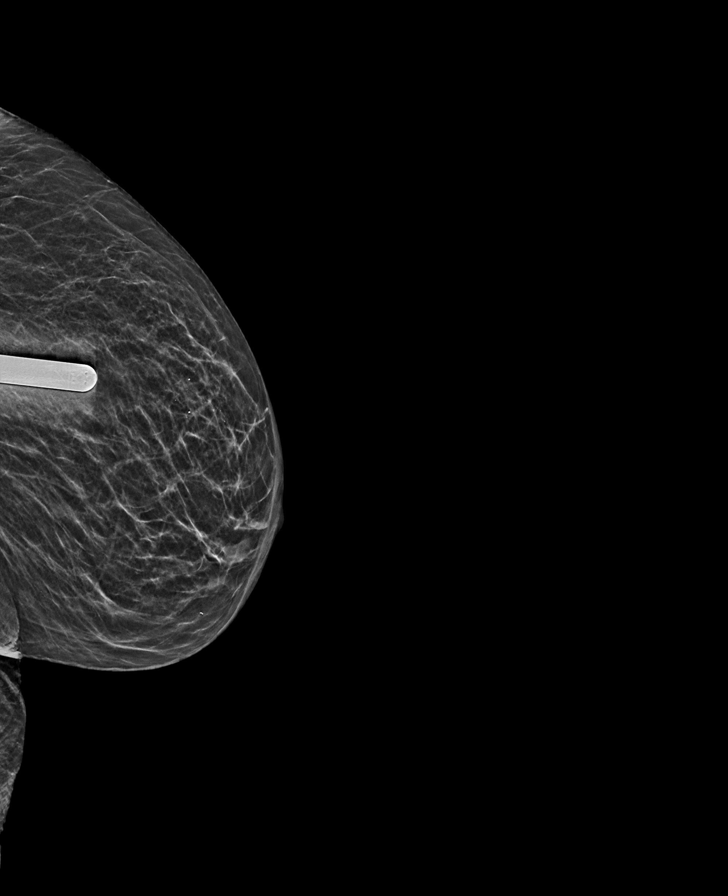

[R CC synth-2D]
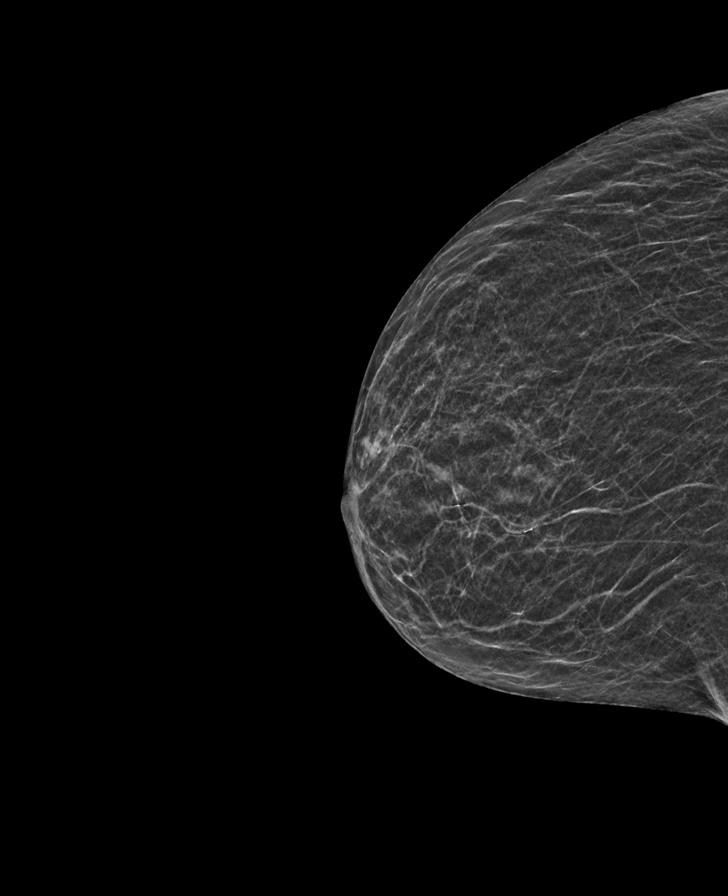

[L CC synth-2D (1 of 2)]
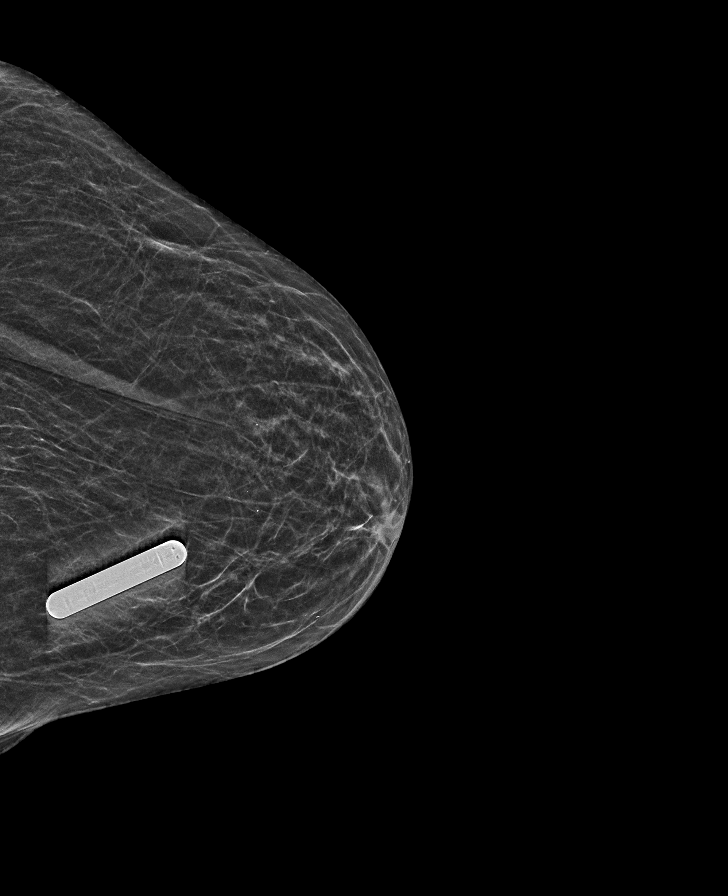

[L CC synth-2D (2 of 2)]
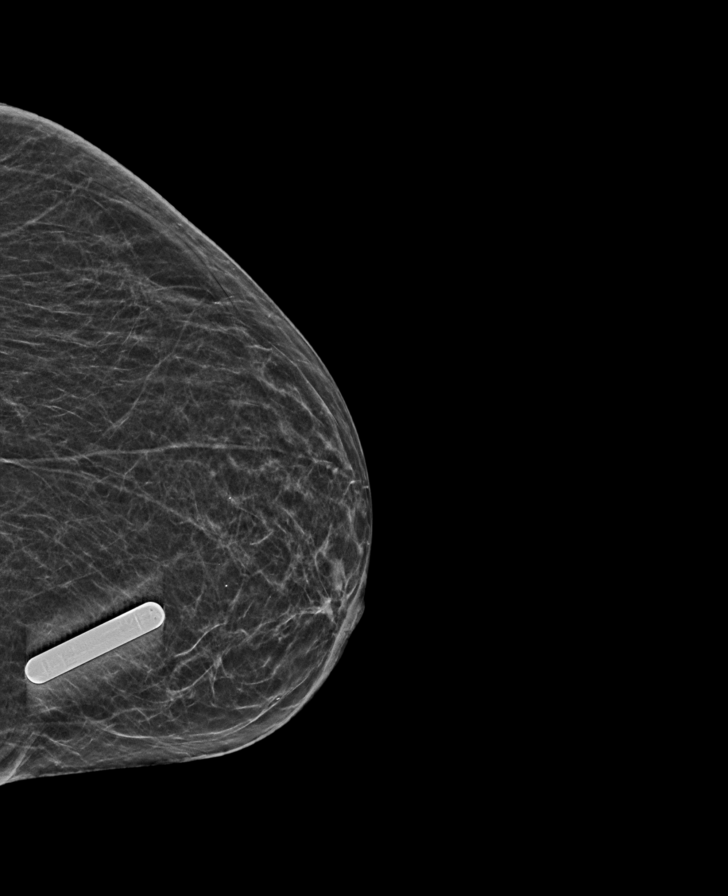

[R MLO synth-2D (2 of 2)]
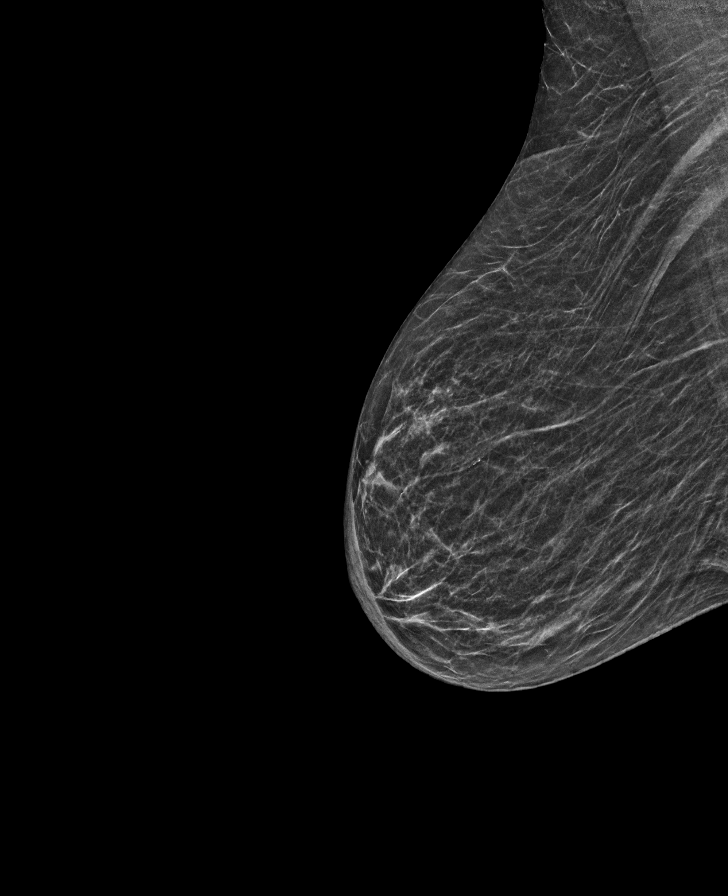

[L MLO synth-2D (2 of 2)]
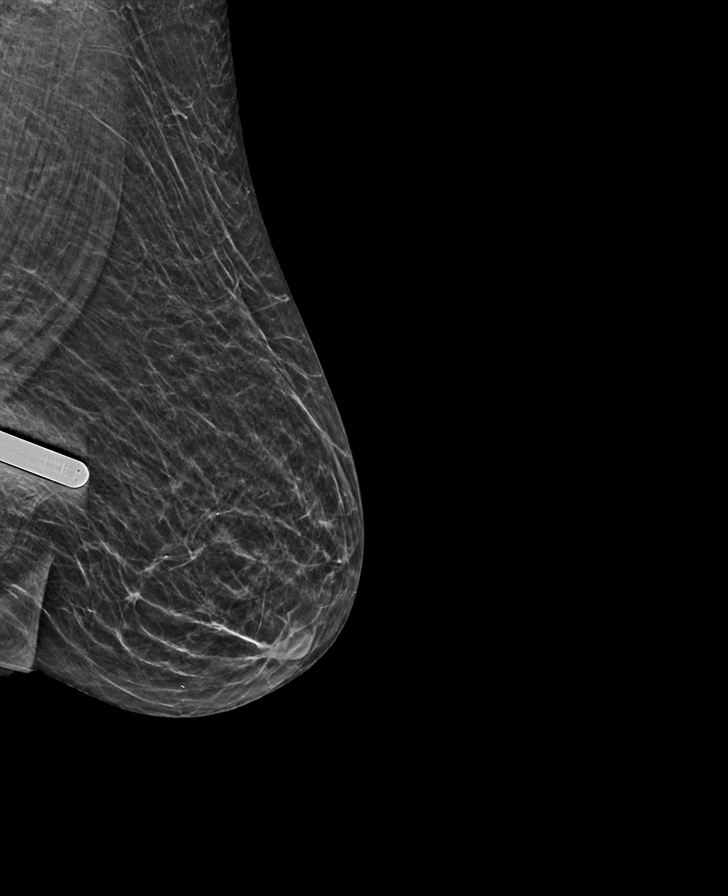

[L CC tomo · tomo slice 20/39.0]
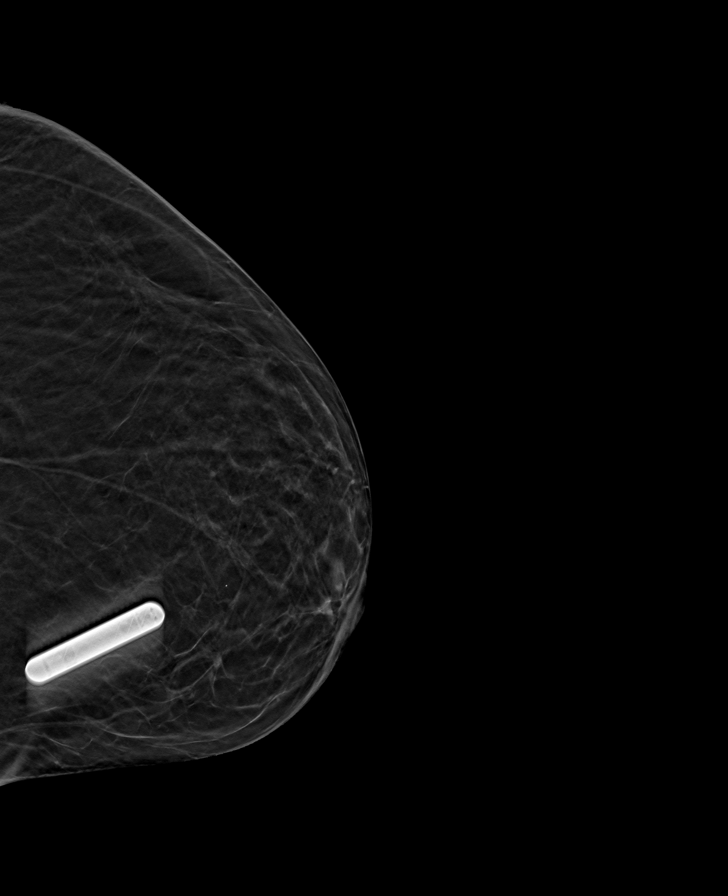

[8 of 40 positions shown; findings below may reference images not displayed]

ACR Breast Density Category b: There are scattered areas of
fibroglandular density.
FINDINGS: In the left breast, a possible asymmetry warrants further
evaluation. In the right breast, no findings suspicious for
malignancy.
IMPRESSION: Further evaluation is suggested for possible asymmetry in the left
breast.

RECOMMENDATION:
Diagnostic mammogram and possibly ultrasound of the left breast.
(Code:[1K])

The patient will be contacted regarding the findings, and additional
imaging will be scheduled.

BI-RADS CATEGORY  0: Incomplete. Need additional imaging evaluation
and/or prior mammograms for comparison.

## 2021-07-11 ENCOUNTER — Other Ambulatory Visit: Payer: Self-pay | Admitting: Internal Medicine

## 2021-07-11 DIAGNOSIS — N6489 Other specified disorders of breast: Secondary | ICD-10-CM

## 2021-07-11 DIAGNOSIS — R928 Other abnormal and inconclusive findings on diagnostic imaging of breast: Secondary | ICD-10-CM

## 2021-07-11 DIAGNOSIS — Z1231 Encounter for screening mammogram for malignant neoplasm of breast: Secondary | ICD-10-CM

## 2021-07-18 ENCOUNTER — Encounter: Payer: Self-pay | Admitting: Family Medicine

## 2021-07-26 ENCOUNTER — Other Ambulatory Visit: Payer: Self-pay | Admitting: Internal Medicine

## 2021-07-26 ENCOUNTER — Ambulatory Visit
Admission: RE | Admit: 2021-07-26 | Discharge: 2021-07-26 | Disposition: A | Discharge status: Home / Self Care | Payer: Medicare Other | Source: Ambulatory Visit | Attending: Internal Medicine | Admitting: Internal Medicine

## 2021-07-26 DIAGNOSIS — Z1231 Encounter for screening mammogram for malignant neoplasm of breast: Secondary | ICD-10-CM | POA: Diagnosis not present

## 2021-07-26 DIAGNOSIS — R928 Other abnormal and inconclusive findings on diagnostic imaging of breast: Secondary | ICD-10-CM | POA: Diagnosis not present

## 2021-07-26 DIAGNOSIS — N6489 Other specified disorders of breast: Secondary | ICD-10-CM | POA: Insufficient documentation

## 2021-07-26 DIAGNOSIS — N6310 Unspecified lump in the right breast, unspecified quadrant: Secondary | ICD-10-CM | POA: Insufficient documentation

## 2021-07-29 ENCOUNTER — Other Ambulatory Visit: Payer: Self-pay | Admitting: Internal Medicine

## 2021-07-29 DIAGNOSIS — N6489 Other specified disorders of breast: Secondary | ICD-10-CM

## 2021-07-29 DIAGNOSIS — R928 Other abnormal and inconclusive findings on diagnostic imaging of breast: Secondary | ICD-10-CM

## 2021-07-29 DIAGNOSIS — N63 Unspecified lump in unspecified breast: Secondary | ICD-10-CM

## 2021-07-30 ENCOUNTER — Other Ambulatory Visit: Payer: Self-pay

## 2021-07-30 DIAGNOSIS — R928 Other abnormal and inconclusive findings on diagnostic imaging of breast: Secondary | ICD-10-CM

## 2021-07-31 ENCOUNTER — Other Ambulatory Visit: Payer: Self-pay | Admitting: Internal Medicine

## 2021-07-31 DIAGNOSIS — R928 Other abnormal and inconclusive findings on diagnostic imaging of breast: Secondary | ICD-10-CM

## 2021-07-31 DIAGNOSIS — N63 Unspecified lump in unspecified breast: Secondary | ICD-10-CM

## 2021-08-01 ENCOUNTER — Encounter: Payer: Self-pay | Admitting: Gynecologic Oncology

## 2021-08-03 DIAGNOSIS — Z8673 Personal history of transient ischemic attack (TIA), and cerebral infarction without residual deficits: Secondary | ICD-10-CM | POA: Diagnosis not present

## 2021-08-05 ENCOUNTER — Inpatient Hospital Stay: Payer: Medicare Other | Attending: Gynecologic Oncology | Admitting: Gynecologic Oncology

## 2021-08-05 ENCOUNTER — Other Ambulatory Visit: Payer: Self-pay

## 2021-08-05 ENCOUNTER — Encounter: Payer: Self-pay | Admitting: Gynecologic Oncology

## 2021-08-05 VITALS — BP 125/83 | HR 70 | Temp 97.7°F | Resp 16 | Ht 65.0 in | Wt 155.1 lb

## 2021-08-05 DIAGNOSIS — Z90722 Acquired absence of ovaries, bilateral: Secondary | ICD-10-CM | POA: Diagnosis not present

## 2021-08-05 DIAGNOSIS — Z87891 Personal history of nicotine dependence: Secondary | ICD-10-CM | POA: Insufficient documentation

## 2021-08-05 DIAGNOSIS — Z7189 Other specified counseling: Secondary | ICD-10-CM | POA: Insufficient documentation

## 2021-08-05 DIAGNOSIS — Z8673 Personal history of transient ischemic attack (TIA), and cerebral infarction without residual deficits: Secondary | ICD-10-CM | POA: Diagnosis not present

## 2021-08-05 DIAGNOSIS — C541 Malignant neoplasm of endometrium: Secondary | ICD-10-CM

## 2021-08-05 DIAGNOSIS — Z90710 Acquired absence of both cervix and uterus: Secondary | ICD-10-CM | POA: Diagnosis not present

## 2021-08-05 DIAGNOSIS — C50212 Malignant neoplasm of upper-inner quadrant of left female breast: Secondary | ICD-10-CM | POA: Insufficient documentation

## 2021-08-05 DIAGNOSIS — Z923 Personal history of irradiation: Secondary | ICD-10-CM | POA: Insufficient documentation

## 2021-08-05 DIAGNOSIS — Z170 Estrogen receptor positive status [ER+]: Secondary | ICD-10-CM | POA: Diagnosis not present

## 2021-08-05 DIAGNOSIS — Z8542 Personal history of malignant neoplasm of other parts of uterus: Secondary | ICD-10-CM | POA: Diagnosis not present

## 2021-08-05 NOTE — Patient Instructions (Signed)
It was good to see you today.  I do not see or feel any evidence of cancer recurrence on your exam.  We will continue with visits every 3 months, alternating between this clinic and radiation oncology. You see Dr. Sondra Come in October.  Please call back sometime in November or December to schedule a visit to see me in January 2024.  If anything changes prior to your next visit or you develop any concerning symptoms for recurrence, please call to see me sooner.

## 2021-08-05 NOTE — Progress Notes (Signed)
Gynecologic Oncology Return Clinic Visit  08/05/21  Reason for Visit: Surveillance in the setting of uterine cancer history  Treatment History: Oncology History Overview Note  MMR IHC normal   Endometrial cancer ([place])  04/2020 Imaging   CT A/P in 04/2020 showed enlarged fibroid uterus; complex cystic mass in left ovary measuring up to 5cm. Pelvic ultrasound 05/01/20: Enlarged multi-fibroid uterus, endometrium 3.70m. Left ovary measures up to 7cm with a 5.8 x 4.1 x 4cm dominant septated cyst.  CA-125   04/2020 Tumor Marker   Patient's tumor was tested for the following markers: CA-125. Results of the tumor marker test revealed 592.   08/10/2020 Imaging   Pelvic ultrasound showed thickened heterogenous endometrial complex with somewhat nodular margins associated with a 3.8 cm diameter fluid collection within the endometrial canal.  8.2 cm cyst of the left ovary.   08/2020 Tumor Marker   Patient's tumor was tested for the following markers: CA-125. Results of the tumor marker test revealed 731.   08/27/2020 Initial Biopsy   EMB showing minute fragments of benign inactive endometrium, mostly blood and fibrin   11/01/2020 Surgery   D&C hysteroscopy, and hysteroscopic sampling of the endometrium.    Final pathology showed at least complex atypical hyperplasia.   11/29/2020 Imaging   Pelvic ultrasound shows thickened endometrial lining measuring 4.4 cm.  Normal-appearing right ovary.  Complex left adnexal cyst measures up to f 5.3 cm.  No flow seen   12/18/2020 Surgery   TRH/BSO, SLN injection with biopsy on let (no mapping on right), mini-lap for specimen removal, repair vaginal lacerations  Findings: On EUA, large globular uterus. On intra-abdominal entry, normal upper abdominal survey.Normal omentum, small and large bowel. Uterus 10cm, very globular and bulbous at the fundus. Multiple fibroids including 3cm lower uterine segment anterior fibroid and posterior 6cm fundal fibroid. Right  adnexa normal and atrophic. Mapping to channels along the right broad ligament, no obvious SLN. On the left, mapping successful to external iliac SLN. Left ovary replaced by 6cm cyst adherent to the broad ligament and sigmoid mesentery with old blood versus necrotic tissue within (cyst wall very easily ripped open with minimal manipulation of the adnexa. Second 2cm cystic component. No obvious findings of malignancy but would be stage II if cancer given adherence to surrounding pelvic structures. Some adhesions between the bladder and cervix. No obvious adenopathy. No ascites. Mini-lap required for specimen removal given size.  No frozen sent as it was not going to impact management. Goal was to keep the surgery under or close to 2 hours.   12/18/2020 Pathology Results   A. LYMPH NODE, LEFT EXTERNAL ILIAC SENTINAL, BIOPSY:  - One lymph node, negative for malignancy (0/1).   B. UTERUS, CERVIX,  BILATERAL TUBES, HYSTERECTOMY AND BILATERAL  SALPINGECTOMY:  - Endometrium:       - Endometrioid endometrial adenocarcinoma, low-grade, arising in a  background of endometrioid intraepithelial neoplasia (EIN), with  extensive colonization of adenomyosis.       - See oncology table.  - Myometrium:       - Adenomyosis, involved by endometrioid endometrial adenocarcinoma.       - Leiomyomata uteri.  - Uterine cervix:       - Benign transformation zone.       - Negative for squamous intraepithelial lesion and malignancy.  - Fallopian tubes:       - No significant histopathologic change.  - Ovaries:       - Benign physiologic changes.   COMMENT:  A. An immunohistochemical study for pancytokeratin is negative. There is  no evidence of metastatic carcinoma.   B. Immunohistochemical studies show the tumor cells to be positive for  PR, and negative for Napsin A.  These findings would favor the above  diagnosis, and suggest against clear cell carcinoma. The definite extent  of myometrial invasion is  less than 50%, but cannot be further  characterized, due to initial sampling of the entire myometrial surface,  and extensive colonization of adenomyosis throughout the myometrium  impacting evaluation of the orientation of these fragments. Full  thickness sections are without definite direct myometrial invasion, but  show extensive adenomyosis   UTERUS, CARCINOMA OR CARCINOSARCOMA: Resection   Procedure: Total hysterectomy with bilateral salpingo-oophorectomy  Histologic Type: Endometrioid carcinoma, NOS  Histologic Grade: Low-grade  Myometrial Invasion:       Cannot be determined  Uterine Serosa Involvement: Not identified  Cervical stromal Involvement: Not identified  Extent of involvement of other tissue/organs: Not identified  Peritoneal/Ascitic Fluid: Not submitted/unknown  Lymphovascular Invasion: Not identified  Regional Lymph Nodes: Regional lymph nodes present  All regional lymph nodes negative for tumor cells       Pelvic Lymph Nodes Examined:  Sentinel: 1               Non-sentinel: 0               Total: 1       Pelvic Lymph Nodes with Metastasis: 0                          Macrometastasis: (>2.0 mm): 0                          Micrometastasis: (>0.2 mm and < 2.0 mm): 0                          Isolated Tumor Cells (<0.2 mm): 0                          Laterality of Lymph Node with Tumor: Not  applicable                             Extracapsular Extension: Not applicable    35/57/3220 Initial Diagnosis   Endometrial cancer ([place])   02/11/2021 - 03/14/2021 Radiation Therapy   02/11/2021 through 03/14/2021 Site Technique Total Dose (Gy) Dose per Fx (Gy) Completed Fx Beam Energies  Vagina: Pelvis HDR-brachy 30/30 6 5/5 Ir-192        Interval History: Patient reports doing well.  Has a migraine today.  Tolerated radiation without any difficulty.  Has been using her vaginal dilator, on average about once a week.  Denies any vaginal bleeding or discharge.  Continues to  have intermittent constipation.  Denies any urinary symptoms.  Has a breast biopsy scheduled for later this month.  Her daughter, Lyndee Leo, is moving back in with her.  Past Medical/Surgical History: Past Medical History:  Diagnosis Date   Acute bronchospasm due to viral infection 2020   Due to pollen   Allergy    Anemia    history of   CVA (cerebral vascular accident) ([place]) 02/28/2020   and 04/19/20   Depressive disorder    Dyspnea    Dysrhythmia    Endometrial cancer ([place]) 12/26/2020  Epistaxis    Fatigue    Hearing loss    History of loop recorder    History of radiation therapy    Pelvis HDR- 02/11/21-03/14/21- Dr. Gery Pray   Hypothyroidism    Memory change    Migraine    Obesity (BMI 30.0-34.9)    lost 60 lbs   Osteoarthritis    Osteoporosis    Polyuria    Premature menopause    Seizures ([place]) 1994   history of mini seizures, possible migraine induced    Past Surgical History:  Procedure Laterality Date   ABDOMINAL HYSTERECTOMY  12/18/2020   ABLATION  2006   BUBBLE STUDY  04/23/2020   Procedure: BUBBLE STUDY;  Surgeon: Larey Dresser, MD;  Location: [facility];  Service: Cardiovascular;;   CATARACT EXTRACTION W/ INTRAOCULAR LENS IMPLANT Bilateral    COLONOSCOPY     COLONOSCOPY WITH PROPOFOL N/A 12/22/2018   Procedure: COLONOSCOPY WITH PROPOFOL;  Surgeon: Virgel Manifold, MD;  Location: [facility];  Service: Endoscopy;  Laterality: N/A;   EYE SURGERY  Spring 2018   Cataracts both eyes   HYSTEROSCOPY WITH D & C N/A 11/01/2020   Procedure: DILATATION AND CURETTAGE /HYSTEROSCOPY WITH MYOSURE;  Surgeon: Lafonda Mosses, MD;  Location: [facility];  Service: Gynecology;  Laterality: N/A;   LAPAROTOMY  12/18/2020   Procedure: LAPAROTOMY;  Surgeon: Lafonda Mosses, MD;  Location: [facility];  Service: Gynecology;;   LOOP RECORDER INSERTION N/A 04/23/2020   Procedure: LOOP RECORDER INSERTION;  Surgeon: Evans Lance, MD;  Location: [facility];  Service: Cardiovascular;  Laterality: N/A;   ROBOTIC ASSISTED TOTAL HYSTERECTOMY WITH BILATERAL SALPINGO OOPHERECTOMY Bilateral 12/18/2020   Procedure: XI ROBOTIC ASSISTED TOTAL HYSTERECTOMY WITH BILATERAL SALPINGO OOPHORECTOMY;  Surgeon: Lafonda Mosses, MD;  Location: [facility];  Service: Gynecology;  Laterality: Bilateral;   SENTINEL NODE BIOPSY N/A 12/18/2020   Procedure: SENTINEL NODE BIOPSY;  Surgeon: Lafonda Mosses, MD;  Location: [facility];  Service: Gynecology;  Laterality: N/A;   TEE WITHOUT CARDIOVERSION N/A 04/23/2020   Procedure: TRANSESOPHAGEAL ECHOCARDIOGRAM (TEE);  Surgeon: Larey Dresser, MD;  Location: [facility];  Service: Cardiovascular;  Laterality: N/A;   TONSILLECTOMY      Family History  Problem Relation Age of Onset   Early death Father        suicide   Depression Father    Diabetes Father    Cancer Father        prostate   Hearing loss Father        due to war   Alzheimer's disease Mother    Heart disease Brother    Atrial fibrillation Brother    Heart disease Maternal Aunt    Dementia Maternal Aunt    Heart disease Maternal Uncle    Dementia Maternal Grandmother    Heart disease Brother    Atrial fibrillation Brother    Colon cancer Neg Hx    Breast cancer Neg Hx    Ovarian cancer Neg Hx    Pancreatic cancer Neg Hx    Endometrial cancer Neg Hx     Social History   Socioeconomic History   Marital status: Single    Spouse name: Not on file   Number of children: 1   Years of education: Not on file   Highest education level: Bachelor's degree (e.g., BA, AB, BS)  Occupational History   Occupation: retired   Tobacco Use   Smoking status: Former    Packs/day: 0.50  Years: 6.00    Total pack years: 3.00    Types: Cigarettes    Quit date: 02/11/1974    Years since quitting: 47.5   Smokeless tobacco: Never  Vaping Use   Vaping Use: Never used  Substance and Sexual Activity   Alcohol use: Not Currently   Drug use: Never   Sexual  activity: Not Currently    Comment: Don't use not sexually active  Other Topics Concern   Not on file  Social History Narrative   Raised an adopted child on her own   Working part time reviewed documents   Social Determinants of Health   Financial Resource Strain: Low Risk  (04/02/2021)   Overall Financial Resource Strain (CARDIA)    Difficulty of Paying Living Expenses: Not hard at all  Food Insecurity: No Food Insecurity (04/02/2021)   Hunger Vital Sign    Worried About Running Out of Food in the Last Year: Never true    [place] in the Last Year: Never true  Transportation Needs: No Transportation Needs (04/02/2021)   PRAPARE - Hydrologist (Medical): No    Lack of Transportation (Non-Medical): No  Physical Activity: Insufficiently Active (04/02/2021)   Exercise Vital Sign    Days of Exercise per Week: 2 days    Minutes of Exercise per Session: 30 min  Stress: No Stress Concern Present (04/02/2021)   [place]    Feeling of Stress : Not at all  Social Connections: Moderately Integrated (04/02/2021)   Social Connection and Isolation Panel [NHANES]    Frequency of Communication with Friends and Family: More than three times a week    Frequency of Social Gatherings with Friends and Family: Twice a week    Attends Religious Services: More than 4 times per year    Active Member of [employer] or Organizations: Yes    Attends Music therapist: More than 4 times per year    Marital Status: Never married    Current Medications:  Current Outpatient Medications:    aspirin 81 MG EC tablet, Take by mouth., Disp: , Rfl:    atorvastatin (LIPITOR) 40 MG tablet, Take 1 tablet (40 mg total) by mouth daily., Disp: 90 tablet, Rfl: 1   buPROPion (WELLBUTRIN SR) 150 MG 12 hr tablet, Take 2 tablets (300 mg total) by mouth daily., Disp: 180 tablet, Rfl: 1   buPROPion (WELLBUTRIN SR) 150 MG  12 hr tablet, Take 2 tablets by mouth daily., Disp: , Rfl:    citalopram (CELEXA) 40 MG tablet, TAKE 1 TABLET BY MOUTH EVERY DAY, Disp: 90 tablet, Rfl: 1   clindamycin-benzoyl peroxide (BENZACLIN) gel, Apply topically 2 (two) times daily. (Patient taking differently: Apply 1 application  topically 2 (two) times daily as needed (facial acne).), Disp: 50 g, Rfl: 0   clopidogrel (PLAVIX) 75 MG tablet, Take 75 mg by mouth daily., Disp: , Rfl:    Cyanocobalamin (VITAMIN B-12) 1000 MCG SUBL, Place 1 tablet (1,000 mcg total) under the tongue 2 (two) times a week., Disp: 30 tablet, Rfl: 0   levothyroxine (SYNTHROID) 75 MCG tablet, TAKE 1 TABLET(75 MCG) BY MOUTH DAILY BEFORE BREAKFAST, Disp: 90 tablet, Rfl: 1   Multiple Vitamin (MULTIVITAMIN WITH MINERALS) TABS tablet, Take 1 tablet by mouth daily., Disp: , Rfl:    polyvinyl alcohol (LIQUIFILM TEARS) 1.4 % ophthalmic solution, Place 1 drop into both eyes daily as needed for dry eyes., Disp: ,  Rfl:    Ubrogepant (UBRELVY) 100 MG TABS, Take 100 mg by mouth daily as needed (migraines)., Disp: , Rfl:    Vitamin D, Ergocalciferol, (DRISDOL) 1.25 MG (50000 UNIT) CAPS capsule, Take 1 capsule (50,000 Units total) by mouth every 14 (fourteen) days., Disp: 6 capsule, Rfl: 1  Review of Systems: + Constipation, headache, decreased concentration Denies appetite changes, fevers, chills, fatigue, unexplained weight changes. Denies hearing loss, neck lumps or masses, mouth sores, ringing in ears or voice changes. Denies cough or wheezing.  Denies shortness of breath. Denies chest pain or palpitations. Denies leg swelling. Denies abdominal distention, pain, blood in stools, diarrhea, nausea, vomiting, or early satiety. Denies pain with intercourse, dysuria, frequency, hematuria or incontinence. Denies hot flashes, pelvic pain, vaginal bleeding or vaginal discharge.   Denies joint pain, back pain or muscle pain/cramps. Denies itching, rash, or wounds. Denies dizziness,  numbness or seizures. Denies swollen lymph nodes or glands, denies easy bruising or bleeding. Denies anxiety, depression, confusion.  Physical Exam: BP 125/83 (BP Location: Left Arm, Patient Position: Sitting)   Pulse 70   Temp 97.7 F (36.5 C) (Tympanic)   Resp 16   Ht 5' 5"  (1.651 m)   Wt 155 lb 1.6 oz (70.4 kg)   SpO2 98%   BMI 25.81 kg/m  General: Alert, oriented, no acute distress. HEENT: Normocephalic, atraumatic, sclera anicteric. Chest: Clear to auscultation bilaterally.  No wheezes or rhonchi. Cardiovascular: Regular rate and rhythm, no murmurs. Abdomen: soft, nontender.  Normoactive bowel sounds.  No masses or hepatosplenomegaly appreciated.  Well-healed incisions. Extremities: Grossly normal range of motion.  Warm, well perfused.  No edema bilaterally. Skin: No rashes or lesions noted. Lymphatics: No cervical, supraclavicular, or inguinal adenopathy. GU: Normal appearing external genitalia without erythema, excoriation, or lesions.  Speculum exam reveals mildly atrophic vaginal mucosa, some radiation changes present.  Minimal agglutination towards the apex of the vagina.  Bimanual exam reveals cuff is smooth with, no nodularity or masses.  Rectovaginal exam confirms findings.  Laboratory & Radiologic Studies: None new  Assessment & Plan: CASON DABNEY is a 71 y.o. woman with Stage 1A grade 1 endometrioid endometrial adenocarcinoma, lymph node assessment deferred.  MMR intact, MSS. Difficulty in determining invasion (estimated at <50%) and size of tumor given adenomyosis. No LVI. Given large area of myometrium involved, tumor size suspected to be larger.  Adjuvant brachytherapy was recommended which was completed in February 2023.   Patient is overall doing well and is NED on exam today.  Tolerated vaginal brachytherapy well, no significant side effects.  Discussed importance of vaginal dilator use to help avoid agglutination.  Patient given a medium size dilator as  she thinks she was given 2 small dilators.  Per NCCN surveillance recommendations, we will plan on visits every 3 months alternating between our clinic and radiation oncology for 2 years and then visits every 6 months.  She sees Dr. Sondra Come in October and was asked to call to schedule an appointment to see me sometime after the new year.    We reviewed signs and symptoms that would be concerning for cancer recurrence, and I encouraged the patient to call if she develops any of these.   22 minutes of total time was spent for this patient encounter, including preparation, face-to-face counseling with the patient and coordination of care, and documentation of the encounter.  Jeral Pinch, MD  Division of Gynecologic Oncology  Department of Obstetrics and Gynecology  [facility]

## 2021-08-14 DIAGNOSIS — H903 Sensorineural hearing loss, bilateral: Secondary | ICD-10-CM | POA: Diagnosis not present

## 2021-08-18 ENCOUNTER — Other Ambulatory Visit: Payer: Self-pay | Admitting: Family Medicine

## 2021-08-18 DIAGNOSIS — E039 Hypothyroidism, unspecified: Secondary | ICD-10-CM

## 2021-08-21 ENCOUNTER — Ambulatory Visit
Admission: RE | Admit: 2021-08-21 | Discharge: 2021-08-21 | Disposition: A | Discharge status: Home / Self Care | Payer: Medicare Other | Source: Ambulatory Visit | Attending: Internal Medicine | Admitting: Internal Medicine

## 2021-08-21 ENCOUNTER — Other Ambulatory Visit: Payer: Self-pay | Admitting: Internal Medicine

## 2021-08-21 DIAGNOSIS — C50912 Malignant neoplasm of unspecified site of left female breast: Secondary | ICD-10-CM

## 2021-08-21 DIAGNOSIS — N6489 Other specified disorders of breast: Secondary | ICD-10-CM

## 2021-08-21 DIAGNOSIS — R928 Other abnormal and inconclusive findings on diagnostic imaging of breast: Secondary | ICD-10-CM | POA: Insufficient documentation

## 2021-08-21 DIAGNOSIS — N63 Unspecified lump in unspecified breast: Secondary | ICD-10-CM

## 2021-08-21 HISTORY — PX: BREAST BIOPSY: SHX20

## 2021-08-21 HISTORY — DX: Malignant neoplasm of unspecified site of left female breast: C50.912

## 2021-08-22 ENCOUNTER — Encounter: Payer: Self-pay | Admitting: Gynecologic Oncology

## 2021-08-22 LAB — SURGICAL PATHOLOGY

## 2021-08-23 ENCOUNTER — Other Ambulatory Visit: Payer: Self-pay | Admitting: Family Medicine

## 2021-08-23 ENCOUNTER — Encounter: Payer: Self-pay | Admitting: Family Medicine

## 2021-08-23 DIAGNOSIS — C50912 Malignant neoplasm of unspecified site of left female breast: Secondary | ICD-10-CM

## 2021-08-23 NOTE — Telephone Encounter (Signed)
Spoke with patient and empathized recent breast cancer diagnosis. We discussed that Dr. Berline Lopes does not perform breast surgeries, she performs gynecologic surgeries.  We recommend patient follow through with the breast center process to determine who would be best to perform her surgery. Patient verbalized understanding. Advised to call with any needs.

## 2021-08-23 NOTE — Telephone Encounter (Signed)
Attempted to contact patient regarding her mychart message. Unable to contact patient, left message requesting return call.

## 2021-08-26 ENCOUNTER — Encounter: Payer: Self-pay | Admitting: *Deleted

## 2021-08-26 DIAGNOSIS — C50919 Malignant neoplasm of unspecified site of unspecified female breast: Secondary | ICD-10-CM

## 2021-08-26 DIAGNOSIS — C50212 Malignant neoplasm of upper-inner quadrant of left female breast: Secondary | ICD-10-CM | POA: Diagnosis not present

## 2021-08-26 DIAGNOSIS — Z170 Estrogen receptor positive status [ER+]: Secondary | ICD-10-CM | POA: Diagnosis not present

## 2021-08-26 NOTE — Progress Notes (Signed)
Received referral for newly diagnosed breast cancer from [facility].  Navigation initiated.  Med Onc  scheduled for 08/30/21 with Dr. Janese Banks.   Patient has already seen Dr. Bary Castilla.

## 2021-08-29 ENCOUNTER — Other Ambulatory Visit: Payer: Self-pay | Admitting: General Surgery

## 2021-08-29 DIAGNOSIS — C50912 Malignant neoplasm of unspecified site of left female breast: Secondary | ICD-10-CM

## 2021-08-29 NOTE — Progress Notes (Signed)
Progress Notes - documented in this encounter Natalie [name], Natalie Boot, MD - 08/26/2021 10:00 AM EDT Formatting of this note is different from the original. Images from the original note were not included. Subjective:   Patient ID: Natalie [name] is a 71 y.o. female.  HPI  The following portions of the patient's history were reviewed and updated as appropriate.  This a new patient is here today for: office visit. The patient has been referred by Dr. Ancil Boozer for evaluation of a newly diagnosed left breast cancer. She had a left breast stereotactic biopsy done on 08-21-21. Patient reports a gap in mammograms due to Covid. She reports she noticed no breast changes prior to her most recent mammogram.   Patient was diagnosed with endometrial cancer in November 2022. She did have radiation at the beginning of this year.   The patient reports her bra size is 36 AA.  The patient had a very tumultuous first half of 2022 when she developed posterior circulation ischemia presenting as diplopia in January 2022. Several months later she had transient symptoms involving the left middle cerebral artery treated with dual antiplatelet therapy because of the posterior circulation stroke in January 2022..  The patient is was noted to have uterine bleeding and subsequently had a hysterectomy with a early stage endometrial cancer. She did receive post surgical radiation therapy.  Chief Complaint  Patient presents with  Treatment Plan Discussion    BP 126/82  Pulse 66  Temp 36.7 C (98.1 F)  Ht 165.1 cm (5' 5" )  Wt 69.4 kg (153 lb)  SpO2 95%  BMI 25.46 kg/m   Past Medical History:  Diagnosis Date  Anemia  B12 deficiency  Breast cancer (CMS-HCC) 08/21/2021  left  CVA (cerebral vascular accident) (CMS-HCC) 02/28/2020  Depression 1994?  See Dr Thurmond Butts  Endometrial cancer ([facility]) 12/26/2020  Hearing loss  History of radiation therapy 2023  Hyperlipidemia 2015?  Not on meds  Memory changes   Migraine headache 1968  Went on meds 1994  Osteoarthritis  Osteoporosis 2014?  Not on meds  Seizures (CMS-HCC) 1994  Thyroid disease early 2000's  on meds, hypothyroidism  Vitamin D deficiency    Past Surgical History:  Procedure Laterality Date  history of ablation 2006  cataract extraction wihtt intraocular lens implant Bilateral 2018  COLONOSCOPY 12/22/2018  loop recorder insertion 04/23/2020  Tee without cardioversion 04/23/2020  Bubble study 04/23/2020  hysteroscopy with d&c 11/01/2020  robotic assisted total hysterectomy with bilateral salpingo ooherectomy Bilateral 12/18/2020  with SLN biopsy and laparotomy  BIOPSY BREAST W/ LOC DEVICE PLACEMENT AND STEREOTACTIC GUIDANCE Left 08/21/2021  TONSILLECTOMY    OB History   Gravida  1  Para  0  Term   Preterm   AB   Living     SAB   IAB   Ectopic   Molar   Multiple   Live Births     Obstetric Comments  Age at first period 65 Age of first pregnancy 45      Social History   Socioeconomic History  Marital status: Single  Tobacco Use  Smoking status: Former  Packs/day: 0.50  Years: 6.00  Pack years: 3.00  Types: Cigarettes  Start date: 02/04/1968  Quit date: 02/11/1974  Years since quitting: 47.5  Smokeless tobacco: Never  Tobacco comments:  This was off and on while in college and couple of years after  Substance and Sexual Activity  Alcohol use: Yes  Alcohol/week: 0.0 standard drinks  Comment: I drink less than 1 drink  a week  Drug use: No  Sexual activity: Not Currently    Allergies  Allergen Reactions  Sulfur (Not Sulfa) Hives   Current Outpatient Medications  Medication Sig Dispense Refill  aspirin 81 MG EC tablet Take 81 mg by mouth once daily  atorvastatin (LIPITOR) 40 MG tablet Take 1 tablet (40 mg total) by mouth once daily 30 tablet 11  buPROPion (WELLBUTRIN SR) 150 MG SR tablet Take 1 tablet (150 mg total) by mouth 2 (two) times daily 5  citalopram (CELEXA) 40 MG  tablet Take 40 mg by mouth once daily. 5  clopidogreL (PLAVIX) 75 mg tablet TAKE 1 TABLET(75 MG) BY MOUTH EVERY DAY 30 tablet 11  cyanocobalamin (VITAMIN B12) 1,000 mcg SL tablet Place under the tongue Place 1,000 mcg under the tongue daily.  ergocalciferol, vitamin D2, 1,250 mcg (50,000 unit) capsule TAKE 1 CAPSULE BY MOUTH EVERY 7 DAYS  levothyroxine (SYNTHROID, LEVOTHROID) 75 MCG tablet TAKE 1 TABLET BY MOUTH EVERY DAY  ubrogepant 100 mg Tab Take 100 mg by mouth once daily as needed 10 tablet 6   No current facility-administered medications for this visit.   Family History  Problem Relation Age of Onset  Alzheimer's disease Mother  died 28  Heart disease Father  Cancer Father  Prostate died 16 of other cause  Diabetes Father  Early death Father  Depression Father  Hearing loss Father  Heart disease Brother  Atrial fibrillation (Abnormal heart rhythm sometimes requiring treatment with blood thinners) Brother  Alzheimer's disease Maternal Grandmother  Never diagnosed--had dementia--died 04/04/1992  Dementia Maternal Grandmother  See above  Migraines Daughter  She is adopted  Heart disease Maternal Aunt  Alzheimer's disease Maternal Aunt  died 04-04-2017  Heart disease Maternal Uncle  Breast cancer Neg Hx  Colon cancer Neg Hx   Labs and Radiology:   August 21, 2021 pathology:  A. BREAST, LEFT, AREA OF ASYMMETRY UPPER INNER QUADRANT; CORE  BIOPSIES:   - INVASIVE MAMMARY CARCINOMA, WITH TUBULAR FEATURES (see comment).   Size of invasive carcinoma: 5 mm in this sample  Histologic grade of invasive carcinoma: Grade 1 (3)  Glandular/tubular differentiation score: 1 (of 3)  Nuclear pleomorphism score: 1 (3)  Mitotic rate score: 1 (of 3)  Total score: 3 (9)  Ductal carcinoma in situ: Present  Lymphovascular invasion: Not identified   ER POSITIVE  Percentage of cells with nuclear positivity: 90%  Average intensity of staining: Strong  PR POSITIVE  Percentage of cells with  nuclear positivity: 10%  Average intensity of staining: Strong  HER2 NEGATIVE (Score 0)   March 13, 2021 laboratory:  WBC 3.8 - 10.8 Thousand/uL 3.4 Low  RBC 3.80 - 5.10 Million/uL 4.21  Hemoglobin 11.7 - 15.5 g/dL 11.4 Low  HCT 35.0 - 45.0 % 36.4  MCV 80.0 - 100.0 fL 86.5  MCH 27.0 - 33.0 pg 27.1  MCHC 32.0 - 36.0 g/dL 31.3 Low  RDW 11.0 - 15.0 % 13.1  Platelets 140 - 400 Thousand/uL 217  MPV 7.5 - 12.5 fL 10.1  Neutro Abs 1,500 - 7,800 cells/uL 1,850  Lymphs Abs 850 - 3,900 cells/uL 1,272  Absolute Monocytes 200 - 950 cells/uL 170 Low  Eosinophils Absolute 15 - 500 cells/uL 88  Basophils Absolute 0 - 200 cells/uL 20  Neutrophils Relative % % 54.4  Total Lymphocyte % 37.4  Monocytes Relative % 5.0  Eosinophils Relative % 2.6  Basophils Relative % 0.6  Glucose, Bld 65 - 99 mg/dL 86  Comment: .  Fasting reference  interval  .  BUN 7 - 25 mg/dL 22  Creat 0.60 - 1.00 mg/dL 0.74  eGFR > OR = 60 mL/min/1.14m 86  Comment: The eGFR is based on the CKD-EPI 2021 equation. To calculate  the new eGFR from a previous Creatinine or Cystatin C  result, go to https://www.kidney.org/professionals/  kdoqi/gfr%5Fcalculator  BUN/Creatinine Ratio 6 - 22 (calc) NOT APPLICABLE  Sodium 1979- 146 mmol/L 139  Potassium 3.5 - 5.3 mmol/L 4.1  Chloride 98 - 110 mmol/L 105  CO2 20 - 32 mmol/L 29  Calcium 8.6 - 10.4 mg/dL 9.3  Total Protein 6.1 - 8.1 g/dL 6.4  Albumin 3.6 - 5.1 g/dL 4.0  Globulin 1.9 - 3.7 g/dL (calc) 2.4  AG Ratio 1.0 - 2.5 (calc) 1.7  Total Bilirubin 0.2 - 1.2 mg/dL 0.5  Alkaline phosphatase (APISO) 37 - 153 U/L 79  AST 10 - 35 U/L 18  ALT 6 - 29 U/L 17   2017 through August 21, 2021 imaging studies reviewed:  New density in the left breast, biopsy results above. "X" marker 3 mm away from area of biopsy. (5 mm distortion).  3 cm distance between the event recorder and the biopsy site on postbiopsy imaging.  Right breast lesion thought to represent a skin cyst. Patient  reports she can no longer identify the area.  March 13, 2021 ECG:  Normal sinus rhythm  Left anterior fascicular block  Septal infarct (cited on or before 14-Oct-2019)  Abnormal ECG  When compared with ECG of 14-Oct-2019 10:46,  No significant change was found  I reviewed and concur with this report. Electronically signed [id number], DWAYNE ([id number]) on 03/20/2021 7:44:04 PM   Review of Systems  Constitutional: Negative for chills and fever.  Respiratory: Negative for cough.    Objective:  Physical Exam Exam conducted with a chaperone present.  Constitutional:  Appearance: Normal appearance.  Cardiovascular:  Rate and Rhythm: Normal rate and regular rhythm.  Pulses: Normal pulses.  Heart sounds: Normal heart sounds.  Pulmonary:  Effort: Pulmonary effort is normal.  Breath sounds: Normal breath sounds.  Chest:  Breasts: Left: Normal.   Comments: 5 cm bruise without hematoma in the area just outside the edge of the areola upper inner quadrant left breast postbiopsy.  Event recorder just cephalad to the biopsy site.  Both breasts are markedly ptotic. Musculoskeletal:  Cervical back: Neck supple.  Lymphadenopathy:  Upper Body:  Right upper body: No supraclavicular or axillary adenopathy.  Left upper body: No supraclavicular or axillary adenopathy.  Skin: General: Skin is warm and dry.  Neurological:  Mental Status: She is alert and oriented to person, place, and time.  Psychiatric:  Mood and Affect: Mood normal.  Behavior: Behavior normal.    Assessment:   Very early, favorable invasive mammary carcinoma of the left breast.  Plan:   Very early stage, T1a, favorable (tubular) carcinoma of the left breast.  The likelihood of MRI to add significantly to the patient's treatment planning.  The patient anticipates speaking with the breast navigator later today.  Opportunity for second surgical opinion reviewed. Emphasis on the low risk for long-term morbidity  with this very favorable cancer.  Message sent to both her cardiologist and neurologist regarding holding Plavix prior to the procedure. Not mandatory from my point of view. Would continue aspirin regardless.  Her adopted daughter lives in [place] and will likely be her transport the day of surgery.  Breast conservation and mastectomy were presented is therapeutically equivalent. Role of sentinel node  biopsy in those over 50 years of age is in evolution. I would recommend that she have this done even for the low risk lesion. At this time, she is interested in breast conservation. Informational brochure provided.  This note is partially prepared by Ledell Noss, CMA acting as a scribe in the presence of Dr. Hervey Ard, MD.   The documentation recorded by the scribe accurately reflects the service I personally performed and the decisions made by me.   Robert Bellow, MD FACS   Electronically signed by Mayer Masker, MD at 08/26/2021 12:00 PM EDT

## 2021-08-30 ENCOUNTER — Encounter: Payer: Self-pay | Admitting: Oncology

## 2021-08-30 ENCOUNTER — Inpatient Hospital Stay: Payer: Medicare Other

## 2021-08-30 ENCOUNTER — Inpatient Hospital Stay (HOSPITAL_BASED_OUTPATIENT_CLINIC_OR_DEPARTMENT_OTHER): Payer: Medicare Other | Admitting: Oncology

## 2021-08-30 VITALS — BP 122/80 | HR 67 | Temp 96.8°F | Resp 16 | Ht 65.0 in | Wt 153.5 lb

## 2021-08-30 DIAGNOSIS — Z90722 Acquired absence of ovaries, bilateral: Secondary | ICD-10-CM | POA: Diagnosis not present

## 2021-08-30 DIAGNOSIS — Z923 Personal history of irradiation: Secondary | ICD-10-CM | POA: Diagnosis not present

## 2021-08-30 DIAGNOSIS — C50212 Malignant neoplasm of upper-inner quadrant of left female breast: Secondary | ICD-10-CM | POA: Diagnosis not present

## 2021-08-30 DIAGNOSIS — Z87891 Personal history of nicotine dependence: Secondary | ICD-10-CM | POA: Diagnosis not present

## 2021-08-30 DIAGNOSIS — Z8673 Personal history of transient ischemic attack (TIA), and cerebral infarction without residual deficits: Secondary | ICD-10-CM

## 2021-08-30 DIAGNOSIS — Z170 Estrogen receptor positive status [ER+]: Secondary | ICD-10-CM | POA: Diagnosis not present

## 2021-08-30 DIAGNOSIS — Z7189 Other specified counseling: Secondary | ICD-10-CM | POA: Diagnosis not present

## 2021-08-30 DIAGNOSIS — Z8542 Personal history of malignant neoplasm of other parts of uterus: Secondary | ICD-10-CM

## 2021-08-30 DIAGNOSIS — Z90710 Acquired absence of both cervix and uterus: Secondary | ICD-10-CM | POA: Diagnosis not present

## 2021-08-30 DIAGNOSIS — C50912 Malignant neoplasm of unspecified site of left female breast: Secondary | ICD-10-CM

## 2021-09-01 DIAGNOSIS — C50912 Malignant neoplasm of unspecified site of left female breast: Secondary | ICD-10-CM | POA: Insufficient documentation

## 2021-09-01 NOTE — Progress Notes (Signed)
Hematology/Oncology Consult note [facility] Telephone:([phone] [phone]  Patient Care Team: [name], MD as PCP - General (Family Medicine) Gery Pray, MD as Consulting Physician (Radiation Oncology) Vladimir Crofts, MD as Consulting Physician (Neurology) Yolonda Kida, MD as Consulting Physician (Cardiology) Lafonda Mosses, MD as Consulting Physician (Gynecologic Oncology)   Name of the patient: Natalie [name]  [id number]  1950/03/21    Reason for referral-new diagnosis of breast cancer   Referring physician-Dr. Ancil Boozer  Date of visit: 09/01/21   History of presenting illness- Patient is a 71 year old female who underwent a bilateral screening mammogram in June 2023 which showed a possible abnormality in her left breast.  Diagnostic mammogram and ultrasound showed indeterminate left breast asymmetry as well as a probable benign right-sided breast mass.  Biopsy of both the masses was recommended.  However the second mass resolved at the time of biopsy likely consistent with fat necrosis from anticoagulate use.  Left breast mass biopsy showed invasive mammary carcinoma grade 1 ER greater than 90% positive, PR negative and HER2 negative.  Personal history of endometrial cancer in November 2022 s/p surgery and adjuvant radiation therapy.  Also had a posterior circulation stroke in January 2022 for which she is on dual antiplatelet therapy.  Age at first. 24.  Age at first pregnancy 82.  She is a G1 P1.  No family history of breast, colon, pancreatic cancer or melanoma.  ECOG PS- 1  Pain scale- 0   Review of systems- Review of Systems  Constitutional:  Negative for chills, fever, malaise/fatigue and weight loss.  HENT:  Negative for congestion, ear discharge and nosebleeds.   Eyes:  Negative for blurred vision.  Respiratory:  Negative for cough, hemoptysis, sputum production, shortness of breath and wheezing.    Cardiovascular:  Negative for chest pain, palpitations, orthopnea and claudication.  Gastrointestinal:  Negative for abdominal pain, blood in stool, constipation, diarrhea, heartburn, melena, nausea and vomiting.  Genitourinary:  Negative for dysuria, flank pain, frequency, hematuria and urgency.  Musculoskeletal:  Negative for back pain, joint pain and myalgias.  Skin:  Negative for rash.  Neurological:  Negative for dizziness, tingling, focal weakness, seizures, weakness and headaches.  Endo/Heme/Allergies:  Does not bruise/bleed easily.  Psychiatric/Behavioral:  Negative for depression and suicidal ideas. The patient does not have insomnia.     Allergies  Allergen Reactions   Sulfamethoxazole-Trimethoprim Hives    Patient Active Problem List   Diagnosis Date Noted   Dyslipidemia 05/16/2021   Major depression, recurrent, chronic ([place]) 03/13/2021   Mild protein-calorie malnutrition ([place]) 03/13/2021   Endometrial cancer ([place]) 12/26/2020   Postoperative anemia due to acute blood loss 12/19/2020   History of CVA in adulthood 04/19/2020   History of ischemic multifocal posterior circulation stroke 02/29/2020   Difficulty hearing 06/28/2015   Arthritis, degenerative 06/28/2015   Paresthesia 06/28/2015   Purpura, nonthrombopenic ([place]) 06/28/2015   Adult hypothyroidism 08/09/2014   Migraine without aura and without status migrainosus, not intractable 09/19/2008   Moderate major depression ([place]) 09/07/2006   OP (osteoporosis) 09/07/2006     Past Medical History:  Diagnosis Date   Acute bronchospasm due to viral infection 2020   Due to pollen   Allergy    Anemia    history of   CVA (cerebral vascular accident) ([place]) 02/28/2020   and 04/19/20   Depressive disorder    Dyspnea    Dysrhythmia    Endometrial cancer ([place]) 12/26/2020   Epistaxis    Fatigue  Hearing loss    History of loop recorder    History of radiation therapy    Pelvis HDR- 02/11/21-03/14/21- Dr. Gery Pray   Hypothyroidism    Memory change    Migraine    Obesity (BMI 30.0-34.9)    lost 60 lbs   Osteoarthritis    Osteoporosis    Polyuria    Premature menopause    Seizures ([place]) 1994   history of mini seizures, possible migraine induced     Past Surgical History:  Procedure Laterality Date   ABDOMINAL HYSTERECTOMY  12/18/2020   ABLATION  2006   BREAST BIOPSY Left 08/21/2021   Stereo Bx, X-clip,path pending   BUBBLE STUDY  04/23/2020   Procedure: BUBBLE STUDY;  Surgeon: Larey Dresser, MD;  Location: [place];  Service: Cardiovascular;;   CATARACT EXTRACTION W/ INTRAOCULAR LENS IMPLANT Bilateral    COLONOSCOPY     COLONOSCOPY WITH PROPOFOL N/A 12/22/2018   Procedure: COLONOSCOPY WITH PROPOFOL;  Surgeon: Virgel Manifold, MD;  Location: [facility];  Service: Endoscopy;  Laterality: N/A;   EYE SURGERY  Spring 2018   Cataracts both eyes   HYSTEROSCOPY WITH D & C N/A 11/01/2020   Procedure: DILATATION AND CURETTAGE /HYSTEROSCOPY WITH MYOSURE;  Surgeon: Lafonda Mosses, MD;  Location: [facility];  Service: Gynecology;  Laterality: N/A;   LAPAROTOMY  12/18/2020   Procedure: LAPAROTOMY;  Surgeon: Lafonda Mosses, MD;  Location: [facility];  Service: Gynecology;;   LOOP RECORDER INSERTION N/A 04/23/2020   Procedure: LOOP RECORDER INSERTION;  Surgeon: Evans Lance, MD;  Location: [place] [facility];  Service: Cardiovascular;  Laterality: N/A;   ROBOTIC ASSISTED TOTAL HYSTERECTOMY WITH BILATERAL SALPINGO OOPHERECTOMY Bilateral 12/18/2020   Procedure: XI ROBOTIC ASSISTED TOTAL HYSTERECTOMY WITH BILATERAL SALPINGO OOPHORECTOMY;  Surgeon: Lafonda Mosses, MD;  Location: [facility];  Service: Gynecology;  Laterality: Bilateral;   SENTINEL NODE BIOPSY N/A 12/18/2020   Procedure: SENTINEL NODE BIOPSY;  Surgeon: Lafonda Mosses, MD;  Location: [facility];  Service: Gynecology;  Laterality: N/A;   TEE WITHOUT CARDIOVERSION N/A 04/23/2020   Procedure: TRANSESOPHAGEAL  ECHOCARDIOGRAM (TEE);  Surgeon: Larey Dresser, MD;  Location: [facility];  Service: Cardiovascular;  Laterality: N/A;   TONSILLECTOMY      Social History   Socioeconomic History   Marital status: Single    Spouse name: Not on file   Number of children: 1   Years of education: Not on file   Highest education level: Bachelor's degree (e.g., BA, AB, BS)  Occupational History   Occupation: retired   Tobacco Use   Smoking status: Former    Packs/day: 0.50    Years: 6.00    Total pack years: 3.00    Types: Cigarettes    Quit date: 02/11/1974    Years since quitting: 47.5   Smokeless tobacco: Never  Vaping Use   Vaping Use: Never used  Substance and Sexual Activity   Alcohol use: Not Currently   Drug use: Never   Sexual activity: Not Currently    Comment: Don't use not sexually active  Other Topics Concern   Not on file  Social History Narrative   Raised an adopted child on her own   Working part time reviewed documents   Social Determinants of Health   Financial Resource Strain: Low Risk  (04/02/2021)   Overall Financial Resource Strain (CARDIA)    Difficulty of Paying Living Expenses: Not hard at all  Food Insecurity: No Food Insecurity (04/02/2021)  Hunger Vital Sign    Worried About Running Out of Food in the Last Year: Never true    Ran Out of Food in the Last Year: Never true  Transportation Needs: No Transportation Needs (04/02/2021)   PRAPARE - Hydrologist (Medical): No    Lack of Transportation (Non-Medical): No  Physical Activity: Insufficiently Active (04/02/2021)   Exercise Vital Sign    Days of Exercise per Week: 2 days    Minutes of Exercise per Session: 30 min  Stress: No Stress Concern Present (04/02/2021)   [place]    Feeling of Stress : Not at all  Social Connections: Moderately Integrated (04/02/2021)   Social Connection and Isolation Panel [NHANES]     Frequency of Communication with Friends and Family: More than three times a week    Frequency of Social Gatherings with Friends and Family: Twice a week    Attends Religious Services: More than 4 times per year    Active Member of [employer] or Organizations: Yes    Attends Music therapist: More than 4 times per year    Marital Status: Never married  Intimate Partner Violence: Not At Risk (04/02/2021)   Humiliation, Afraid, Rape, and Kick questionnaire    Fear of Current or Ex-Partner: No    Emotionally Abused: No    Physically Abused: No    Sexually Abused: No     Family History  Problem Relation Age of Onset   Early death Father        suicide   Depression Father    Diabetes Father    Cancer Father        prostate   Hearing loss Father        due to war   Alzheimer's disease Mother    Heart disease Brother    Atrial fibrillation Brother    Heart disease Maternal Aunt    Dementia Maternal Aunt    Heart disease Maternal Uncle    Dementia Maternal Grandmother    Heart disease Brother    Atrial fibrillation Brother    Colon cancer Neg Hx    Breast cancer Neg Hx    Ovarian cancer Neg Hx    Pancreatic cancer Neg Hx    Endometrial cancer Neg Hx      Current Outpatient Medications:    aspirin 81 MG EC tablet, Take by mouth., Disp: , Rfl:    atorvastatin (LIPITOR) 40 MG tablet, Take 1 tablet (40 mg total) by mouth daily., Disp: 90 tablet, Rfl: 1   buPROPion (WELLBUTRIN SR) 150 MG 12 hr tablet, Take 2 tablets (300 mg total) by mouth daily., Disp: 180 tablet, Rfl: 1   citalopram (CELEXA) 40 MG tablet, TAKE 1 TABLET BY MOUTH EVERY DAY, Disp: 90 tablet, Rfl: 1   clindamycin-benzoyl peroxide (BENZACLIN) gel, Apply topically 2 (two) times daily. (Patient taking differently: Apply 1 application  topically 2 (two) times daily as needed (facial acne).), Disp: 50 g, Rfl: 0   clopidogrel (PLAVIX) 75 MG tablet, Take 75 mg by mouth daily., Disp: , Rfl:    Cyanocobalamin (VITAMIN  B-12) 1000 MCG SUBL, Place 1 tablet (1,000 mcg total) under the tongue 2 (two) times a week., Disp: 30 tablet, Rfl: 0   levothyroxine (SYNTHROID) 75 MCG tablet, TAKE 1 TABLET(75 MCG) BY MOUTH DAILY BEFORE BREAKFAST, Disp: 90 tablet, Rfl: 1   lidocaine-prilocaine (EMLA) cream, Apply to areola one hour prior to arrival  day of procedure. Cover with saran wrap., Disp: , Rfl:    Multiple Vitamin (MULTIVITAMIN WITH MINERALS) TABS tablet, Take 1 tablet by mouth daily., Disp: , Rfl:    polyvinyl alcohol (LIQUIFILM TEARS) 1.4 % ophthalmic solution, Place 1 drop into both eyes daily as needed for dry eyes., Disp: , Rfl:    Ubrogepant (UBRELVY) 100 MG TABS, Take 100 mg by mouth daily as needed (migraines)., Disp: , Rfl:    Vitamin D, Ergocalciferol, (DRISDOL) 1.25 MG (50000 UNIT) CAPS capsule, Take 1 capsule (50,000 Units total) by mouth every 14 (fourteen) days., Disp: 6 capsule, Rfl: 1   Physical exam:  Vitals:   08/30/21 1346  BP: 122/80  Pulse: 67  Resp: 16  Temp: (!) 96.8 F (36 C)  SpO2: 99%  Weight: 153 lb 8 oz (69.6 kg)  Height: 5' 5" (1.651 m)   Physical Exam Constitutional:      General: She is not in acute distress. Cardiovascular:     Rate and Rhythm: Normal rate and regular rhythm.     Heart sounds: Normal heart sounds.  Pulmonary:     Effort: Pulmonary effort is normal.     Breath sounds: Normal breath sounds.  Abdominal:     General: Bowel sounds are normal.     Palpations: Abdomen is soft.  Skin:    General: Skin is warm and dry.  Neurological:     Mental Status: She is alert and oriented to person, place, and time.    Breast exam: No palpable mass in either breast.  No palpable bilateral axillary adenopathy.      Latest Ref Rng & Units 03/13/2021   10:58 AM  CMP  Glucose 65 - 99 mg/dL 86   BUN 7 - 25 mg/dL 22   Creatinine 0.60 - 1.00 mg/dL 0.74   Sodium 135 - 146 mmol/L 139   Potassium 3.5 - 5.3 mmol/L 4.1   Chloride 98 - 110 mmol/L 105   CO2 20 - 32 mmol/L 29    Calcium 8.6 - 10.4 mg/dL 9.3   Total Protein 6.1 - 8.1 g/dL 6.4   Total Bilirubin 0.2 - 1.2 mg/dL 0.5   AST 10 - 35 U/L 18   ALT 6 - 29 U/L 17       Latest Ref Rng & Units 03/13/2021   10:58 AM  CBC  WBC 3.8 - 10.8 Thousand/uL 3.4   Hemoglobin 11.7 - 15.5 g/dL 11.4   Hematocrit 35.0 - 45.0 % 36.4   Platelets 140 - 400 Thousand/uL 217     No images are attached to the encounter.  MM LT BREAST BX W LOC DEV 1ST LESION IMAGE BX SPEC STEREO GUIDE  Addendum Date: 08/28/2021   ADDENDUM REPORT: 08/28/2021 08:07 ADDENDUM: PATHOLOGY revealed: A. BREAST, LEFT, AREA OF ASYMMETRY UPPER INNER QUADRANT; CORE BIOPSIES: - INVASIVE MAMMARY CARCINOMA, WITH TUBULAR FEATURES (see comment). Size of invasive carcinoma: 5 mm. Grade 1 (3). Ductal carcinoma in situ: Present. Lymphovascular invasion: Not identified. Comment: The definitive grade will be assigned on the excisional specimen. In the material present the infiltrating mammary carcinoma has tubular features, calponin stain confirms the lack of a myoepithelial layer. (definitive subtyping best performed on an excisional specimen). Pathology results are CONCORDANT with imaging findings, per Dr. Hassan Rowan. Pathology results and recommendations were discussed with patient via telephone on 08/23/2021. Patient reported biopsy site doing well with no adverse symptoms, and only slight tenderness at the site. Post biopsy care instructions were reviewed, questions were  answered and my direct phone number was provided. Patient was instructed to call [facility] for any additional questions or concerns related to biopsy site. RECOMMENDATIONS: Surgical consultation. Request for surgical consultation relayed to Mariea Clonts RN at [facility] by Electa Sniff RN on 08/23/2021. Pathology results reported by Electa Sniff RN on 08/26/2021. Electronically Signed   By: Margarette Canada M.D.   On: 08/28/2021 08:07   Result Date: 08/28/2021 CLINICAL DATA:   71 year old female for tissue sampling of 0.5 cm UPPER INNER LEFT breast asymmetry. EXAM: LEFT BREAST STEREOTACTIC CORE NEEDLE BIOPSY COMPARISON:  Previous exam(s). FINDINGS: The patient and I discussed the procedure of stereotactic-guided biopsy including benefits and alternatives. We discussed the high likelihood of a successful procedure. We discussed the risks of the procedure including infection, bleeding, tissue injury, clip migration, and inadequate sampling. Informed written consent was given. The usual time out protocol was performed immediately prior to the procedure. Using sterile technique and 1% Lidocaine with and without epinephrine as local anesthetic, under stereotactic guidance, a 9 gauge vacuum assisted device was used to perform core needle biopsies of the 0.5 cm asymmetry within the UPPER INNER LEFT breast using a MEDIAL approach. Specimen radiograph was performed. Lesion quadrant: UPPER INNER LEFT breast At the conclusion of the procedure, an X shaped tissue marker clip was deployed into the biopsy cavity. Follow-up 2-view mammogram was performed and dictated separately. IMPRESSION: Stereotactic-guided biopsy of 0.5 cm UPPER INNER LEFT breast asymmetry. No apparent complications. Electronically Signed: By: Margarette Canada M.D. On: 08/21/2021 09:21   MM CLIP PLACEMENT LEFT  Result Date: 08/21/2021 CLINICAL DATA:  Evaluate X biopsy clip placement following 3D/stereotactic guided LEFT breast biopsy. EXAM: 3D DIAGNOSTIC LEFT MAMMOGRAM POST STEREOTACTIC BIOPSY COMPARISON:  Previous exam(s). FINDINGS: 3D Mammographic images were obtained following 3D/stereotactic guided biopsy of 0.5 cm UPPER INNER LEFT breast asymmetry. The X biopsy marking clip is located 3 mm anterolateral to the biopsy site. IMPRESSION: X biopsy marking clip is located 3 mm anterolateral to the biopsied asymmetry. Final Assessment: Post Procedure Mammograms for Marker Placement Electronically Signed   By: Margarette Canada M.D.   On:  08/21/2021 09:26  US BREAST LTD UNI RIGHT INC AXILLA  Result Date: 08/21/2021 CLINICAL DATA:  71 year old female returns for aspiration of OUTER RIGHT breast cystic structure. EXAM: ULTRASOUND OF THE RIGHT BREAST COMPARISON:  07/26/2021 FINDINGS: Targeted ultrasound is performed, showing resolution of the near anechoic mass/cystic structure at the 9 o'clock position of the RIGHT breast 15 cm from the nipple. As the patient is on blood thinners and described bruising over this area when palpated, this is compatible with resolved fat necrosis/bruising changes. IMPRESSION: 1. Near anechoic mass/cystic structure in the far OUTER RIGHT breast has resolved, compatible with resolved fat necrosis/bruising changes. Aspiration was not performed. RECOMMENDATION: The patient has a LEFT breast stereotactic biopsy that will be performed today. I have discussed the findings and recommendations with the patient. If applicable, a reminder letter will be sent to the patient regarding the next appointment. BI-RADS CATEGORY  1: Negative (RIGHT breast). Electronically Signed   By: Margarette Canada M.D.   On: 08/21/2021 09:04   Assessment and plan- Patient is a 71 y.o. female with newly diagnosed left breast cancer clinical prognostic stage I aT1 N0 M0 ER positive PR negative and HER2 negative  The exact size of the left breast masses unclear at this time.  Overall it is a low-grade 1 invasive mammary carcinoma strongly ER positive PR negative  and HER2 negative.  Axillary lymph nodes appeared normal.  I would recommend upfront lumpectomy and sentinel lymph node biopsy at this time.  Based on the results of final pathology we will see if Oncotype testing needs to be sent to determine if patient would benefit from adjuvant chemotherapy.  Discussed what Oncotype testing is and how the results are interpreted.  I will tentatively see her back in about 2 to 3 weeks time after final pathology results are back along with radiation  oncology.  Patient would benefit from adjuvant radiation therapy.  Given that her tumor is ER positive there would also be role for endocrine therapy for 5 years which I will discuss with her in greater detail at my next visit.  Treatment will be given with a curative intent.   Thank you for this kind referral and the opportunity to participate in the care of this patient   Visit Diagnosis 1. Malignant neoplasm of left breast in female, estrogen receptor positive, unspecified site of breast ([place])   2. Goals of care, counseling/discussion     Dr. Randa Evens, MD, MPH [facility] at [facility] [phone] 09/01/2021

## 2021-09-03 ENCOUNTER — Other Ambulatory Visit: Payer: Self-pay | Admitting: General Surgery

## 2021-09-03 DIAGNOSIS — C50912 Malignant neoplasm of unspecified site of left female breast: Secondary | ICD-10-CM

## 2021-09-04 ENCOUNTER — Inpatient Hospital Stay: Payer: Medicare Other | Attending: Gynecologic Oncology | Admitting: Hospice and Palliative Medicine

## 2021-09-04 DIAGNOSIS — Z170 Estrogen receptor positive status [ER+]: Secondary | ICD-10-CM

## 2021-09-04 DIAGNOSIS — C50912 Malignant neoplasm of unspecified site of left female breast: Secondary | ICD-10-CM

## 2021-09-04 NOTE — Progress Notes (Signed)
Multidisciplinary Oncology Council Documentation  Natalie [name] was presented by our [facility] on 09/04/2021, which included representatives from:  Palliative Care Dietitian  Physical/Occupational Therapist Nurse Navigator Genetics Speech Therapist Social work Survivorship RN Financial Navigator Research RN   Natalie [name] currently presents with history of breast cancer  We reviewed previous medical and familial history, history of present illness, and recent lab results along with all available histopathologic and imaging studies. The [place] considered available treatment options and made the following recommendations/referrals:  Rehab screening  The MOC is a meeting of clinicians from various specialty areas who evaluate and discuss patients for whom a multidisciplinary approach is being considered. Final determinations in the plan of care are those of the provider(s).   Today's extended care, comprehensive team conference, Natalie [name] was not present for the discussion and was not examined.

## 2021-09-06 ENCOUNTER — Telehealth: Payer: Self-pay | Admitting: *Deleted

## 2021-09-06 NOTE — Telephone Encounter (Signed)
Received incoming referral from [facility] for [facility]- OT screen. Mychart msg sent to patient.  Apt held for patient.

## 2021-09-10 ENCOUNTER — Encounter: Payer: Self-pay | Admitting: Family Medicine

## 2021-09-10 ENCOUNTER — Ambulatory Visit (INDEPENDENT_AMBULATORY_CARE_PROVIDER_SITE_OTHER): Payer: Medicare Other | Admitting: Family Medicine

## 2021-09-10 VITALS — BP 112/78 | HR 82 | Resp 16 | Ht 65.0 in | Wt 155.0 lb

## 2021-09-10 DIAGNOSIS — R2681 Unsteadiness on feet: Secondary | ICD-10-CM | POA: Diagnosis not present

## 2021-09-10 DIAGNOSIS — I639 Cerebral infarction, unspecified: Secondary | ICD-10-CM

## 2021-09-10 DIAGNOSIS — C50912 Malignant neoplasm of unspecified site of left female breast: Secondary | ICD-10-CM

## 2021-09-10 DIAGNOSIS — M720 Palmar fascial fibromatosis [Dupuytren]: Secondary | ICD-10-CM

## 2021-09-10 DIAGNOSIS — I208 Other forms of angina pectoris: Secondary | ICD-10-CM | POA: Diagnosis not present

## 2021-09-10 DIAGNOSIS — I6340 Cerebral infarction due to embolism of unspecified cerebral artery: Secondary | ICD-10-CM | POA: Diagnosis not present

## 2021-09-10 DIAGNOSIS — Z8542 Personal history of malignant neoplasm of other parts of uterus: Secondary | ICD-10-CM

## 2021-09-10 DIAGNOSIS — D692 Other nonthrombocytopenic purpura: Secondary | ICD-10-CM

## 2021-09-10 DIAGNOSIS — R0602 Shortness of breath: Secondary | ICD-10-CM | POA: Diagnosis not present

## 2021-09-10 DIAGNOSIS — R5382 Chronic fatigue, unspecified: Secondary | ICD-10-CM | POA: Diagnosis not present

## 2021-09-10 DIAGNOSIS — Z8673 Personal history of transient ischemic attack (TIA), and cerebral infarction without residual deficits: Secondary | ICD-10-CM

## 2021-09-10 DIAGNOSIS — E538 Deficiency of other specified B group vitamins: Secondary | ICD-10-CM

## 2021-09-10 DIAGNOSIS — R002 Palpitations: Secondary | ICD-10-CM | POA: Diagnosis not present

## 2021-09-10 DIAGNOSIS — D62 Acute posthemorrhagic anemia: Secondary | ICD-10-CM | POA: Diagnosis not present

## 2021-09-10 DIAGNOSIS — F339 Major depressive disorder, recurrent, unspecified: Secondary | ICD-10-CM

## 2021-09-10 DIAGNOSIS — I10 Essential (primary) hypertension: Secondary | ICD-10-CM | POA: Diagnosis not present

## 2021-09-10 DIAGNOSIS — E785 Hyperlipidemia, unspecified: Secondary | ICD-10-CM | POA: Diagnosis not present

## 2021-09-10 DIAGNOSIS — Z170 Estrogen receptor positive status [ER+]: Secondary | ICD-10-CM

## 2021-09-10 DIAGNOSIS — E441 Mild protein-calorie malnutrition: Secondary | ICD-10-CM | POA: Diagnosis not present

## 2021-09-10 DIAGNOSIS — M65341 Trigger finger, right ring finger: Secondary | ICD-10-CM | POA: Diagnosis not present

## 2021-09-10 DIAGNOSIS — E039 Hypothyroidism, unspecified: Secondary | ICD-10-CM | POA: Diagnosis not present

## 2021-09-10 DIAGNOSIS — R9431 Abnormal electrocardiogram [ECG] [EKG]: Secondary | ICD-10-CM | POA: Diagnosis not present

## 2021-09-10 DIAGNOSIS — R609 Edema, unspecified: Secondary | ICD-10-CM | POA: Diagnosis not present

## 2021-09-10 DIAGNOSIS — E559 Vitamin D deficiency, unspecified: Secondary | ICD-10-CM

## 2021-09-10 MED ORDER — ATORVASTATIN CALCIUM 40 MG PO TABS: 40.0000 mg | ORAL_TABLET | Freq: Every day | ORAL | 1 refills | Status: DC

## 2021-09-10 NOTE — Progress Notes (Signed)
Name: Natalie [name]   MRN: [id number]    DOB: 17-Mar-1950   Date:09/10/2021       Progress Note  Subjective  Chief Complaint  Follow Up  HPI  Anemia unspecified: last labs had improved, we will recheck prior to next visit. Advised to recheck labs   MDD: she  was under Dr. Leonette Most care for many years but he is retiring and she asked me to prescribe her medications from now on. she takes medications as prescribed. She completes endometrial cancer treatment. She is  doing well with medications   B12 deficiency:we will recheck levels, taking vitamin D every other week, taking B12 twice a week, and we will recheck labs   Possible Cognitive dysfunctions: she has a normal MMS test done today, however has been repeating same questions to loved ones. She sees Dr. Manuella Ghazi and she is really worried because of her family history of Alzheimer's , she has an upcoming appointment at his office this week.   Hearing loss: she went back to ENT but hearing stable since 2014 when she had it checked last time   Hypothyroidism: she has been taking medication as prescribed, she states hair is not falling as much, she has intermittent  dry skin, no constipation . Last TSH was at goal  , continue current regiment    Dyslipidemia/history of CVA times two: she is now taking statin therapy, she had two strokes since January 22 and March 22. She is currently on Plavix and aspirin.    HPV positive/High CA 125/ovarian mass : seen by Dr. Alvy Bimler and after that   Dr. Jeral Pinch , treated for endometrium cancer and is now in remission   Obesity  she has lost another 10 lbs since Feb 23 , total of 58  lbs since Feb 2022 She was treated for endometrial cancer had hysterectomy nov 15 th, currently diagnosed with breast cancer and having lumpectomy this month  Malnutrition: she has lost 68 lbs since Feb 2022  explained she needs to gain some weight prior to surgery scheduled for August 23 rd of this month  Dupuytren's  contractures: she has seen Dr. Peggye Ley in the past and is noticing worsening of symptoms right hand, also trigger finger of right 4th finger right side.   Patient Active Problem List   Diagnosis Date Noted   Malignant neoplasm of left breast in female, estrogen receptor positive ([place]) 09/01/2021   Dyslipidemia 05/16/2021   Major depression, recurrent, chronic ([place]) 03/13/2021   Mild protein-calorie malnutrition ([place]) 03/13/2021   Endometrial cancer ([place]) 12/26/2020   Postoperative anemia due to acute blood loss 12/19/2020   History of CVA in adulthood 04/19/2020   History of ischemic multifocal posterior circulation stroke 02/29/2020   Difficulty hearing 06/28/2015   Arthritis, degenerative 06/28/2015   Paresthesia 06/28/2015   Purpura, nonthrombopenic ([place]) 06/28/2015   Adult hypothyroidism 08/09/2014   Migraine without aura and without status migrainosus, not intractable 09/19/2008   Moderate major depression ([place]) 09/07/2006   OP (osteoporosis) 09/07/2006    Past Surgical History:  Procedure Laterality Date   ABDOMINAL HYSTERECTOMY  12/18/2020   ABLATION  2006   BREAST BIOPSY Left 08/21/2021   Stereo Bx, X-clip,path pending   BUBBLE STUDY  04/23/2020   Procedure: BUBBLE STUDY;  Surgeon: Larey Dresser, MD;  Location: [facility];  Service: Cardiovascular;;   CATARACT EXTRACTION W/ INTRAOCULAR LENS IMPLANT Bilateral    COLONOSCOPY     COLONOSCOPY WITH PROPOFOL N/A 12/22/2018   Procedure: COLONOSCOPY  WITH PROPOFOL;  Surgeon: Virgel Manifold, MD;  Location: [facility];  Service: Endoscopy;  Laterality: N/A;   EYE SURGERY  Spring 2018   Cataracts both eyes   HYSTEROSCOPY WITH D & C N/A 11/01/2020   Procedure: DILATATION AND CURETTAGE /HYSTEROSCOPY WITH MYOSURE;  Surgeon: Lafonda Mosses, MD;  Location: [facility];  Service: Gynecology;  Laterality: N/A;   LAPAROTOMY  12/18/2020   Procedure: LAPAROTOMY;  Surgeon: Lafonda Mosses, MD;  Location: [facility];  Service:  Gynecology;;   LOOP RECORDER INSERTION N/A 04/23/2020   Procedure: LOOP RECORDER INSERTION;  Surgeon: Evans Lance, MD;  Location: [facility];  Service: Cardiovascular;  Laterality: N/A;   ROBOTIC ASSISTED TOTAL HYSTERECTOMY WITH BILATERAL SALPINGO OOPHERECTOMY Bilateral 12/18/2020   Procedure: XI ROBOTIC ASSISTED TOTAL HYSTERECTOMY WITH BILATERAL SALPINGO OOPHORECTOMY;  Surgeon: Lafonda Mosses, MD;  Location: [facility];  Service: Gynecology;  Laterality: Bilateral;   SENTINEL NODE BIOPSY N/A 12/18/2020   Procedure: SENTINEL NODE BIOPSY;  Surgeon: Lafonda Mosses, MD;  Location: [facility];  Service: Gynecology;  Laterality: N/A;   TEE WITHOUT CARDIOVERSION N/A 04/23/2020   Procedure: TRANSESOPHAGEAL ECHOCARDIOGRAM (TEE);  Surgeon: Larey Dresser, MD;  Location: [facility];  Service: Cardiovascular;  Laterality: N/A;   TONSILLECTOMY      Family History  Problem Relation Age of Onset   Early death Father        suicide   Depression Father    Diabetes Father    Cancer Father        prostate   Hearing loss Father        due to war   Alzheimer's disease Mother    Heart disease Brother    Atrial fibrillation Brother    Heart disease Maternal Aunt    Dementia Maternal Aunt    Heart disease Maternal Uncle    Dementia Maternal Grandmother    Heart disease Brother    Atrial fibrillation Brother    Colon cancer Neg Hx    Breast cancer Neg Hx    Ovarian cancer Neg Hx    Pancreatic cancer Neg Hx    Endometrial cancer Neg Hx     Social History   Tobacco Use   Smoking status: Former    Packs/day: 0.50    Years: 6.00    Total pack years: 3.00    Types: Cigarettes    Quit date: 02/11/1974    Years since quitting: 47.6   Smokeless tobacco: Never  Substance Use Topics   Alcohol use: Not Currently     Current Outpatient Medications:    aspirin 81 MG EC tablet, Take by mouth., Disp: , Rfl:    atorvastatin (LIPITOR) 40 MG tablet, Take 1 tablet (40 mg total) by mouth  daily., Disp: 90 tablet, Rfl: 1   buPROPion (WELLBUTRIN SR) 150 MG 12 hr tablet, Take 2 tablets (300 mg total) by mouth daily., Disp: 180 tablet, Rfl: 1   citalopram (CELEXA) 40 MG tablet, TAKE 1 TABLET BY MOUTH EVERY DAY, Disp: 90 tablet, Rfl: 1   clopidogrel (PLAVIX) 75 MG tablet, Take 75 mg by mouth daily., Disp: , Rfl:    Cyanocobalamin (VITAMIN B-12) 1000 MCG SUBL, Place 1 tablet (1,000 mcg total) under the tongue 2 (two) times a week., Disp: 30 tablet, Rfl: 0   levothyroxine (SYNTHROID) 75 MCG tablet, TAKE 1 TABLET(75 MCG) BY MOUTH DAILY BEFORE BREAKFAST, Disp: 90 tablet, Rfl: 1   lidocaine-prilocaine (EMLA) cream, Apply to areola one hour prior  to arrival day of procedure. Cover with saran wrap., Disp: , Rfl:    Multiple Vitamin (MULTIVITAMIN WITH MINERALS) TABS tablet, Take 1 tablet by mouth daily., Disp: , Rfl:    polyvinyl alcohol (LIQUIFILM TEARS) 1.4 % ophthalmic solution, Place 1 drop into both eyes daily as needed for dry eyes., Disp: , Rfl:    Ubrogepant (UBRELVY) 100 MG TABS, Take 100 mg by mouth daily as needed (migraines)., Disp: , Rfl:    Vitamin D, Ergocalciferol, (DRISDOL) 1.25 MG (50000 UNIT) CAPS capsule, Take 1 capsule (50,000 Units total) by mouth every 14 (fourteen) days., Disp: 6 capsule, Rfl: 1   clindamycin-benzoyl peroxide (BENZACLIN) gel, Apply topically 2 (two) times daily. (Patient not taking: Reported on 09/10/2021), Disp: 50 g, Rfl: 0  Allergies  Allergen Reactions   Sulfamethoxazole-Trimethoprim Hives    I personally reviewed active problem list, medication list, allergies, family history, social history, health maintenance with the patient/caregiver today.   ROS  Constitutional: Negative for fever , positive for weight change.  Respiratory: Negative for cough and shortness of breath.   Cardiovascular: Negative for chest pain or palpitations.  Gastrointestinal: Negative for abdominal pain, no bowel changes.  Musculoskeletal: Negative for gait problem or  joint swelling.  Skin: Negative for rash.  Neurological: Negative for dizziness or headache.  No other specific complaints in a complete review of systems (except as listed in HPI above).   Objective  Vitals:   09/10/21 1057  BP: 112/78  Pulse: 82  Resp: 16  SpO2: 97%  Weight: 155 lb (70.3 kg)  Height: '5\' 5"'$  (1.651 m)    Body mass index is 25.79 kg/m.  Physical Exam  Constitutional: Patient appears well-developed and well-nourished.  No distress.  HEENT: head atraumatic, normocephalic, pupils equal and reactive to light, neck supple Cardiovascular: Normal rate, regular rhythm and normal heart sounds.  No murmur heard. No BLE edema. Pulmonary/Chest: Effort normal and breath sounds normal. No respiratory distress. Abdominal: Soft.  There is no tenderness. Psychiatric: Patient has a normal mood and affect. behavior is normal. Judgment and thought content normal.    PHQ2/9:    09/10/2021   10:56 AM 06/18/2021   10:22 AM 04/02/2021    2:25 PM 03/13/2021   10:11 AM 12/11/2020   11:39 AM  Depression screen PHQ 2/9  Decreased Interest 0 0 0 0 0  Down, Depressed, Hopeless 0 0 0 0 0  PHQ - 2 Score 0 0 0 0 0  Altered sleeping 0 1 0 0 1  Tired, decreased energy 0 1 1 0 1  Change in appetite 0 1 1 0 0  Feeling bad or failure about yourself  0 0 0 0 0  Trouble concentrating 0 1 0 0 0  Moving slowly or fidgety/restless 0 0 0 0 0  Suicidal thoughts 0 0 0 0 0  PHQ-9 Score 0 4 2 0 2  Difficult doing work/chores  Not difficult at all       phq 9 is negative   Fall Risk:    09/10/2021   10:56 AM 06/18/2021   10:22 AM 04/02/2021    2:30 PM 03/13/2021   10:11 AM 12/11/2020   11:39 AM  Fall Risk   Falls in the past year? 0 '1 1 1 1  '$ Number falls in past yr: 0 0 '1 1 1  '$ Injury with Fall? 0 '1 1 1 '$ 0  Risk for fall due to : No Fall Risks History of fall(s) No Fall Risks No Fall  Risks Impaired balance/gait  Follow up Falls prevention discussed Falls prevention discussed;Education  provided;Falls evaluation completed Falls prevention discussed Falls prevention discussed Falls prevention discussed      Functional Status Survey: Is the patient deaf or have difficulty hearing?: Yes Does the patient have difficulty seeing, even when wearing glasses/contacts?: No Does the patient have difficulty concentrating, remembering, or making decisions?: Yes Does the patient have difficulty walking or climbing stairs?: No Does the patient have difficulty dressing or bathing?: No Does the patient have difficulty doing errands alone such as visiting a doctor's office or shopping?: No    Assessment & Plan  1. Trigger finger, right ring finger  - Ambulatory referral to Orthopedic Surgery  2. Vitamin D deficiency   3. Dupuytren's contracture of both hands  - Ambulatory referral to Orthopedic Surgery  4. Dyslipidemia  - atorvastatin (LIPITOR) 40 MG tablet; Take 1 tablet (40 mg total) by mouth daily.  Dispense: 90 tablet; Refill: 1  5. History of CVA (cerebrovascular accident)  - atorvastatin (LIPITOR) 40 MG tablet; Take 1 tablet (40 mg total) by mouth daily.  Dispense: 90 tablet; Refill: 1  6. B12 deficiency  We will recheck level   7. History of CVA in adulthood   8. Major depression, recurrent, chronic (HCC)  Doing well on medication   9. Mild protein-calorie malnutrition ([place])  Discussed importance of gaining weight   10. Purpura, nonthrombopenic ([place])   11. Adult hypothyroidism  Continue current dose  12. Recurrent cerebrovascular accidents (CVAs) ([place])  Doing well   13. Malignant neoplasm of left breast in female, estrogen receptor positive, unspecified site of breast [facility])  Surgery scheduled for the end of the mouth  14. History of malignant neoplasm of endometrium

## 2021-09-10 NOTE — Patient Instructions (Signed)
Trigger Finger  Trigger finger, also called stenosing tenosynovitis,  is a condition that causes a finger to get stuck in a bent position. Each finger has a tendon, which is a tough, cord-like tissue that connects muscle to bone, and each tendon passes through a tunnel of tissue called a tendon sheath. To move your finger, your tendon needs to glide freely through the sheath. Trigger finger happens when the tendon or the sheath thickens, making it difficult to move your finger. Trigger finger can affect any finger or a thumb. It may affect more than one finger. Mild cases may clear up with rest and medicine. Severe cases require more treatment. What are the causes? Trigger finger is caused by a thickened finger tendon or tendon sheath. The cause of this thickening is not known. What increases the risk? The following factors may make you more likely to develop this condition: Doing activities that require a strong grip. Having rheumatoid arthritis, gout, or diabetes. Being 40-60 years old. Being female. What are the signs or symptoms? Symptoms of this condition include: Pain when bending or straightening your finger. Tenderness or swelling where your finger attaches to the palm of your hand. A lump in the palm of your hand or on the inside of your finger. Hearing a noise like a pop or a snap when you try to straighten your finger. Feeling a catching or locking sensation when you try to straighten your finger. Being unable to straighten your finger. How is this diagnosed? This condition is diagnosed based on your symptoms and a physical exam. How is this treated? This condition may be treated by: Resting your finger and avoiding activities that make symptoms worse. Wearing a finger splint to keep your finger extended. Taking NSAIDs, such as ibuprofen, to relieve pain and swelling. Doing gentle exercises to stretch the finger as told by your health care provider. Having medicine that reduces  swelling and inflammation (steroids) injected into the tendon sheath. Injections may need to be repeated. Having surgery to open the tendon sheath. This may be done if other treatments do not work and you cannot straighten your finger. You may need physical therapy after surgery. Follow these instructions at home: If you have a splint: Wear the splint as told by your health care provider. Remove it only as told by your health care provider. Loosen it if your fingers tingle, become numb, or turn cold and blue. Keep it clean. If the splint is not waterproof: Do not let it get wet. Cover it with a watertight covering when you take a bath or shower. Managing pain, stiffness, and swelling     If directed, apply heat to the affected area as often as told by your health care provider. Use the heat source that your health care provider recommends, such as a moist heat pack or a heating pad. Place a towel between your skin and the heat source. Leave the heat on for 20-30 minutes. Remove the heat if your skin turns bright red. This is especially important if you are unable to feel pain, heat, or cold. You may have a greater risk of getting burned. If directed, put ice on the painful area. To do this: If you have a removable splint, remove it as told by your health care provider. Put ice in a plastic bag. Place a towel between your skin and the bag or between your splint and the bag. Leave the ice on for 20 minutes, 2-3 times a day.  Activity Rest   your finger as told by your health care provider. Avoid activities that make the pain worse. Return to your normal activities as told by your health care provider. Ask your health care provider what activities are safe for you. Do exercises as told by your health care provider. Ask your health care provider when it is safe to drive if you have a splint on your hand. General instructions Take over-the-counter and prescription medicines only as told by  your health care provider. Keep all follow-up visits as told by your health care provider. This is important. Contact a health care provider if: Your symptoms are not improving with home care. Summary Trigger finger, also called stenosing tenosynovitis, causes your finger to get stuck in a bent position. This can make it difficult and painful to straighten your finger. This condition develops when a finger tendon or tendon sheath thickens. Treatment may include resting your finger, wearing a splint, and taking medicines. In severe cases, surgery to open the tendon sheath may be needed. This information is not intended to replace advice given to you by your health care provider. Make sure you discuss any questions you have with your health care provider. Document Revised: 06/07/2018 Document Reviewed: 06/07/2018 Elsevier Patient Education  2023 [employer]  

## 2021-09-11 LAB — CBC WITH DIFFERENTIAL/PLATELET
Absolute Monocytes: 204 cells/uL (ref 200–950)
Basophils Absolute: 11 cells/uL (ref 0–200)
Basophils Relative: 0.3 %
Eosinophils Absolute: 93 cells/uL (ref 15–500)
Eosinophils Relative: 2.5 %
HCT: 36.7 % (ref 35.0–45.0)
Hemoglobin: 12.3 g/dL (ref 11.7–15.5)
Lymphs Abs: 1573 cells/uL (ref 850–3900)
MCH: 32 pg (ref 27.0–33.0)
MCHC: 33.5 g/dL (ref 32.0–36.0)
MCV: 95.6 fL (ref 80.0–100.0)
MPV: 9.7 fL (ref 7.5–12.5)
Monocytes Relative: 5.5 %
Neutro Abs: 1820 cells/uL (ref 1500–7800)
Neutrophils Relative %: 49.2 %
Platelets: 174 10*3/uL (ref 140–400)
RBC: 3.84 10*6/uL (ref 3.80–5.10)
RDW: 11.8 % (ref 11.0–15.0)
Total Lymphocyte: 42.5 %
WBC: 3.7 10*3/uL — ABNORMAL LOW (ref 3.8–10.8)

## 2021-09-11 LAB — VITAMIN D 25 HYDROXY (VIT D DEFICIENCY, FRACTURES): Vit D, 25-Hydroxy: 106 ng/mL — ABNORMAL HIGH (ref 30–100)

## 2021-09-11 LAB — B12 AND FOLATE PANEL
Folate: >24 ng/mL
Vitamin B-12: 945 pg/mL (ref 200–1100)

## 2021-09-12 DIAGNOSIS — G3184 Mild cognitive impairment, so stated: Secondary | ICD-10-CM | POA: Diagnosis not present

## 2021-09-17 ENCOUNTER — Encounter
Admission: RE | Admit: 2021-09-17 | Discharge: 2021-09-17 | Disposition: A | Discharge status: Home / Self Care | Payer: Medicare Other | Source: Ambulatory Visit | Attending: General Surgery | Admitting: General Surgery

## 2021-09-17 ENCOUNTER — Encounter: Payer: Self-pay | Admitting: General Surgery

## 2021-09-17 ENCOUNTER — Inpatient Hospital Stay: Admission: RE | Admit: 2021-09-17 | Payer: Medicare Other | Source: Ambulatory Visit

## 2021-09-17 NOTE — Patient Instructions (Addendum)
Your procedure is scheduled on: Wednesday September 25, 2021. Report to [facility] as instructed by  your provider.   Remember: Instructions that are not followed completely may result in serious medical risk,  up to and including death, or upon the discretion of your surgeon and anesthesiologist your  surgery may need to be rescheduled.     _X__ 1. Do not eat food after midnight the night before your procedure.                 No chewing gum or hard candies. You may drink clear liquids up to 2 hours                 before you are scheduled to arrive for your surgery- DO not drink clear                 liquids within 2 hours of the start of your surgery.                 Clear Liquids include:  water, apple juice without pulp, clear Gatorade, G2 or                  Gatorade Zero (avoid Red/Purple/Blue), Black Coffee or Tea (Do not add                 anything to coffee or tea).  __X__2.  On the morning of surgery brush your teeth with toothpaste and water, you                may rinse your mouth with mouthwash if you wish.  Do not swallow any toothpaste or mouthwash.     _X__ 3.  No Alcohol for 24 hours before or after surgery.   _X__ 4.  Do Not Smoke or use e-cigarettes For 24 Hours Prior to Your Surgery.                 Do not use any chewable tobacco products for at least 6 hours prior to                 Surgery.  _X__  5.  Do not use any recreational drugs (marijuana, cocaine, heroin, ecstasy, MDMA or other)                For at least one week prior to your surgery.  Combination of these drugs with anesthesia                May have life threatening results.  ____  6.  Bring all medications with you on the day of surgery if instructed.   __X__  7.  Notify your doctor if there is any change in your medical condition      (cold, fever, infections).     Do not wear jewelry, make-up, hairpins, clips or nail polish. Do not wear lotions, powders, or  perfumes. You may wear deodorant. Do not shave 48 hours prior to surgery. Men may shave face and neck. Do not bring valuables to the hospital.    [facility] is not responsible for any belongings or valuables.  Contacts, dentures or bridgework may not be worn into surgery. Leave your suitcase in the car. After surgery it may be brought to your room. For patients admitted to the hospital, discharge time is determined by your treatment team.   Patients discharged the day of surgery will not be allowed to drive home.   Make arrangements for  someone to be with you for the first 24 hours of your Same Day Discharge.   __X__ Take these medicines the morning of surgery with A SIP OF WATER:    1. buPROPion (WELLBUTRIN SR) 150 MG   2. citalopram (CELEXA) 40 MG tablet  3. levothyroxine (SYNTHROID) 75 MCG   4.  5.  6.  ____ Fleet Enema (as directed)   __X__ Use CHG Soap (or wipes) as directed  ____ Use Benzoyl Peroxide Gel as instructed  ____ Use inhalers on the day of surgery  ____ Stop metformin 2 days prior to surgery    ____ Take 1/2 of usual insulin dose the night before surgery. No insulin the morning          of surgery.   __X__ Stop clopidogrel (PLAVIX) 75 MG as instructed by your cardiologist before your surgery.   __X__ One Week prior to surgery- Stop Anti-inflammatories such as Ibuprofen, Aleve, Advil, Motrin, meloxicam (MOBIC), diclofenac, etodolac, ketorolac, Toradol, Daypro, piroxicam, Goody's or BC powders. OK TO USE TYLENOL IF NEEDED   __X__ Stop supplements until after surgery.    ____ Bring C-Pap to the hospital.    If you have any questions regarding your pre-procedure instructions,  Please call Pre-admit Testing at [phone]

## 2021-09-18 ENCOUNTER — Inpatient Hospital Stay: Payer: Medicare Other | Admitting: Occupational Therapy

## 2021-09-18 ENCOUNTER — Encounter: Payer: Self-pay | Admitting: General Surgery

## 2021-09-18 DIAGNOSIS — Z0000 Encounter for general adult medical examination without abnormal findings: Secondary | ICD-10-CM

## 2021-09-18 NOTE — Therapy (Signed)
[facility] at [facility] [place], [place], [place], [address] Phone: [phone]   Fax:  [phone]  Occupational Therapy Screen:  Patient Details  Name: Natalie [name] MRN: [id number] Date of Birth: 03/23/1950 No data recorded  Encounter Date: 09/18/2021   OT End of Session - 09/18/21 1543     Visit Number 0             Past Medical History:  Diagnosis Date   Acute bronchospasm due to viral infection 2020   Due to pollen   Acute ischemic left MCA stroke ([place]) 04/19/2020   a.) CTA head/neck 04/19/2020 --> nearly occlusive thrombus at bifurcation of a proximal M2 MCA branch approximately 6 mm from the MCA bifurcation. Subsequent occlusion of a proximal left M3 MCA branch   Allergy    Anemia    Angina at rest [facility])    Breast cancer, left ([place]) 08/21/2021   a.) stereotactic Bx 08/21/2021 --> IMC (G1, ER/PR +, Her2/neu -)   Cataract    a.) s/p extraction with IOL placement   Depressive disorder    Diverticulosis    Dyspnea    Epistaxis    Fatigue    Hearing loss    History of 2019 novel coronavirus disease (COVID-19) 12/18/2020   History of ischemic multifocal posterior circulation stroke 02/28/2020   a.) presented to ED with increasing episodes of  transient vertical binocular diplopia; MRI 02/27/2021 --> multiple punctate acute to subacute ischemic infarcts involving the cortical/subcortical aspects of both parieto-occipital regions   History of loop recorder    HLD (hyperlipidemia)    Hypothyroidism    LAFB (left anterior fascicular block)    Long term current use of antithrombotics/antiplatelets    a.) on DAPT therapy (ASA + clopidogrel)   Memory change    Migraine    Osteoarthritis    Osteoporosis    Palpitations    Premature menopause    QT prolongation    Seizures ([place]) 1994   history of mini seizures, possible migraine induced   Stage I adenocarcinoma of endometrium ([place]) 12/18/2020   a.) stage  1A; grade 1 (pT1a, pN0); b.) s/p TLH/BSO 12/18/2020 + adjuvant vaginal brachytherapy (30 Gy over 5 fractions between 02/11/2021 - 03/14/2021)   Vitamin D deficiency     Past Surgical History:  Procedure Laterality Date   ABDOMINAL HYSTERECTOMY  12/18/2020   ABLATION  2006   BREAST BIOPSY Left 08/21/2021   Stereo Bx, X-clip; path --> IMC (G1, ER/PR +, Her2/neu -)   BUBBLE STUDY  04/23/2020   Procedure: BUBBLE STUDY;  Surgeon: Larey Dresser, MD;  Location: [place];  Service: Cardiovascular;;   CATARACT EXTRACTION W/ INTRAOCULAR LENS IMPLANT Bilateral    COLONOSCOPY     COLONOSCOPY WITH PROPOFOL N/A 12/22/2018   Procedure: COLONOSCOPY WITH PROPOFOL;  Surgeon: Virgel Manifold, MD;  Location: [facility] ENDOSCOPY;  Service: Endoscopy;  Laterality: N/A;   EYE SURGERY  Spring 2018   Cataracts both eyes   HYSTEROSCOPY WITH D & C N/A 11/01/2020   Procedure: DILATATION AND CURETTAGE /HYSTEROSCOPY WITH MYOSURE;  Surgeon: Lafonda Mosses, MD;  Location: [facility];  Service: Gynecology;  Laterality: N/A;   LAPAROTOMY  12/18/2020   Procedure: LAPAROTOMY;  Surgeon: Lafonda Mosses, MD;  Location: [facility];  Service: Gynecology;;   LOOP RECORDER INSERTION N/A 04/23/2020   Procedure: LOOP RECORDER INSERTION;  Surgeon: Evans Lance, MD;  Location: [place] [facility];  Service: Cardiovascular;  Laterality: N/A;   ROBOTIC ASSISTED TOTAL HYSTERECTOMY WITH BILATERAL SALPINGO OOPHERECTOMY Bilateral 12/18/2020   Procedure: XI ROBOTIC ASSISTED TOTAL HYSTERECTOMY WITH BILATERAL SALPINGO OOPHORECTOMY;  Surgeon: Lafonda Mosses, MD;  Location: [facility];  Service: Gynecology;  Laterality: Bilateral;   SENTINEL NODE BIOPSY N/A 12/18/2020   Procedure: SENTINEL NODE BIOPSY;  Surgeon: Lafonda Mosses, MD;  Location: [facility];  Service: Gynecology;  Laterality: N/A;   TEE WITHOUT CARDIOVERSION N/A 04/23/2020   Procedure: TRANSESOPHAGEAL ECHOCARDIOGRAM (TEE);  Surgeon: Larey Dresser, MD;  Location: [facility]  ENDOSCOPY;  Service: Cardiovascular;  Laterality: N/A;   TONSILLECTOMY      There were no vitals filed for this visit.   Subjective Assessment - 09/18/21 1543     Subjective  I am going to have a lumpectomy on 8/23 by Dr. Bary Castilla.  My left shoulder is a little bit lower normally.  But otherwise I think will be okay.  Maybe would need radiation.  I did had 2 strokes last year so my memory is not as good.    Currently in Pain? No/denies               Assessment and plan Dr Janese Banks 08/30/21- Patient is a 71 y.o. female with newly diagnosed left breast cancer clinical prognostic stage I aT1 N0 M0 ER positive PR negative and HER2 negative   The exact size of the left breast masses unclear at this time.  Overall it is a low-grade 1 invasive mammary carcinoma strongly ER positive PR negative and HER2 negative.  Axillary lymph nodes appeared normal.  I would recommend upfront lumpectomy and sentinel lymph node biopsy at this time.  Based on the results of final pathology we will see if Oncotype testing needs to be sent to determine if patient would benefit from adjuvant chemotherapy.  Discussed what Oncotype testing is and how the results are interpreted.   I will tentatively see her back in about 2 to 3 weeks time after final pathology results are back along with radiation oncology.  Patient would benefit from adjuvant radiation therapy.  Given that her tumor is ER positive there would also be role for endocrine therapy for 5 years which I will discuss with her in greater detail at my next visit.  Treatment will be given with a curative intent.    OT SCREEN 09/18/21: Patient referred to OT by [facility] team meeting.  Patient to have 8/23 L lumpectomy by Dr. Bary Castilla. Patient report her left shoulder is a little bit lower and does not have full motion. Patient to walk once or twice a week for about a mile with her dog.  Patient is independent in ADLs as well as driving and home activities. Has 2 children  lives close by in town. Patient was educated in active assisted range of motion for bilateral shoulder flexion and abduction on the wall after surgery and okay for patient to do overhead range of motion. Or patient can do in supine using a cane-active assisted range of motion for shoulder flexion as well as abduction and external rotation.  10 reps pain-free She should feel a slight pull or stretch in the axilla but not in the shoulder. Patient can follow-up with me if needed as well as for possibly prior to radiation to reassess range of motion as well circumference of bilateral upper extremity.  Handout for lymphedema was provided for patient but patient's risk is low for lymphedema.  Visit Diagnosis: Encounter for screening and preventative care    Problem List Patient Active Problem List   Diagnosis Date Noted   Trigger finger, right ring finger 09/10/2021   Dupuytren's contracture of both hands 09/10/2021   Vitamin D deficiency 09/10/2021   Malignant neoplasm of left breast in female, estrogen receptor positive ([place]) 09/01/2021   Dyslipidemia 05/16/2021   Major depression, recurrent, chronic ([place]) 03/13/2021   Mild protein-calorie malnutrition ([place]) 03/13/2021   Endometrial cancer ([place]) 12/26/2020   Postoperative anemia due to acute blood loss 12/19/2020   History of CVA in adulthood 04/19/2020   History of ischemic multifocal posterior circulation stroke 02/29/2020   Difficulty hearing 06/28/2015   Arthritis, degenerative 06/28/2015   Paresthesia 06/28/2015   Purpura, nonthrombopenic ([place]) 06/28/2015   Adult hypothyroidism 08/09/2014   Migraine without aura and without status migrainosus, not intractable 09/19/2008   Moderate major depression ([place]) 09/07/2006   OP (osteoporosis) 09/07/2006    Rosalyn Gess, OTR/L,CLT 09/18/2021, 3:44 PM  [place] at [facility] [address], [place], [place], [address] Phone: [phone]   Fax:  [phone]  Name: Natalie [name] MRN: [id number] Date of Birth: December 18, 1950

## 2021-09-18 NOTE — Progress Notes (Signed)
Perioperative Services  Pre-Admission/Anesthesia Testing Clinical Review  Date: 09/20/21  Patient Demographics:  Name: Natalie [name] DOB:   February 19, 1950 MRN:   [id number]  Planned Surgical Procedure(s):    Case: [id number] Date/Time: 09/25/21 1145   Procedures:      BREAST LUMPECTOMY WITH NEEDLE LOCALIZATION (Left)     AXILLARY SENTINEL NODE BIOPSY (Left)   Anesthesia type: General   Pre-op diagnosis: left breast cancer   Location: [facility] OR ROOM 05 / [place] [facility]   Surgeons: Robert Bellow, MD   NOTE: Available PAT nursing documentation and vital signs have been reviewed. Clinical nursing staff has updated patient's PMH/PSHx, current medication list, and drug allergies/intolerances to ensure comprehensive history available to assist in medical decision making as it pertains to the aforementioned surgical procedure and anticipated anesthetic course. Extensive review of available clinical information performed. [place] PMH and PSHx updated with any diagnoses/procedures that  may have been inadvertently omitted during her intake with the pre-admission testing department's nursing staff.  Clinical Discussion:  Natalie [name] is a 70 y.o. female who is submitted for pre-surgical anesthesia review and clearance prior to her undergoing the above procedure. Patient is a Former Smoker (3 pack years; quit 02/1974). Pertinent PMH includes: palpitations, angina at rest, LAFB, QT prolongation, CVA x2, HLD, hypothyroidism, dyspnea, anemia, endometrial cancer (s/p vaginal brachytherapy), LEFT breast cancer, seizures, OA, depression.  Patient is followed by cardiology Clayborn Bigness, MD). She was last seen in the cardiology clinic on 09/10/2021; notes reviewed.  At the time of her clinic visit, patient doing well overall from a cardiovascular perspective.  She denied any episodes of chest pain, shortness breath, PND, orthopnea, significant peripheral edema, vertiginous symptoms, or  presyncope/syncope.  Patient with mild intermittent episodes of palpitations.  She has experienced more generalized fatigue that was felt to be multifactorial.  Patient also with periods of unsteady gait, however she reported no recent falls.  Patient with a past medical history significant for cardiovascular diagnoses.  Patient presented to the ED on 02/28/2020 with increasing episodes of transient vertical binocular diplopia.  MRI imaging revealed multiple punctate acute to subacute ischemic infarcts involving the cortical/subcortical aspects of both parieto-occipital regions.  TTE performed on 02/29/2020 revealed a normal left ventricular systolic function with an EF of 60 to 65%.  There were no regional wall motion abnormalities.  RVSF normal.  There was trivial mitral and aortic valve regurgitation.  There were no significant transvalvular gradients to suggest stenosis.  Myocardial perfusion imaging study performed on 03/19/2020 revealing a normal left ventricular systolic function with an EF of 64%.  Wall motion and myocardial thickening was normal.  There was no evidence of stress-induced myocardial ischemia or arrhythmia; no scintigraphic evidence of scar.  The overall quality of the study was good.  The study was determined to be normal and low risk.  CT head/neck performed on 04/19/2020 revealed nearly occlusive thrombus at the bifurcation of the proximal M2 MCA branch approximately 6 mm from the MCA bifurcation.  Additionally, there was a subsequent occlusion of the proximal left M3 MCA branch.  Previous CVA with no clear etiology.  Patient was experiencing intermittent palpitations.  She ultimately underwent ILR placement on 04/23/2020.  TEE at that time revealed a normal left ventricular systolic function with an EF of 55 to 60%.  There were no regional wall motion abnormalities.  Left atrium was moderately dilated.  There was trivial mitral and aortic valve regurgitation.  All gradients were  normal; no  valvular stenosis.  There was no evidence of intra atrial shunting; no PFO or ASD.  Following recent CVAs, patient remains on daily DAPT therapy (ASA + clopidogrel).  She is reportedly compliant with therapy with no evidence or reports of GI bleeding.  Blood pressure well controlled at 124/72 without the use of pharmacological intervention.  Patient is on atorvastatin for her HLD diagnosis and ASCVD prevention.  She is not diabetic.  She does not have an OSAH diagnosis.  Patient maintaining an active lifestyle and has a formal exercise regimen. Functional capacity, as defined by DASI, is documented as being >/= 4 METS.   Natalie [name] underwent stereotactic needle biopsy on 08/21/2021.  Pathology revealed invasive mammary carcinoma.  Subsequent IHC testing revealed that tumor was ER/PR (+) and HER2/neu (-).  Patient referred to general surgery for consultation regarding surgical resection; notes reviewed.  Patient is scheduled to undergo a LEFT BREAST LUMPECTOMY WITH NEEDLE LOCALIZATION; LEFT AXILLARY SENTINEL NODE BIOPSY on 09/25/2021 with Dr. Hervey Ard, MD.  Given patient's past medical history significant for cardiovascular and neurological diagnoses, presurgical clearances were sought from patient's primary cardiology and neurology teams.  Specialty clearances were obtained as follows:  Per neurology (Paich, PA-C), "this patient is optimized for the planned surgical intervention at an overall LOW risk from a neurological perspective".  Per cardiology Dema Severin, NP-C), "this patient is optimized for surgery and may proceed with the planned procedural course with a MODERATE risk of significant perioperative cardiovascular complications".  Again, this patient is on daily DAPT therapy.  She has been instructed on recommendations from her neurologist for holding her clopidogrel for 5 days prior to her procedure with plans to restart her since postoperative bleeding risk of the metabolic  by her primary attending surgeon.  The patient is aware that her last dose of clopidogrel should be on 09/19/2021.  Patient has also been cleared to hold her daily low-dose ASA as needed by the surgical team.  Given patient's cardiovascular and neurological history, the decision was made by surgery to allow patient to remain on daily low-dose ASA throughout the perioperative course.  She patient denies previous perioperative complications with anesthesia in the past. In review of the available records, it is noted that patient underwent a general anesthetic course at [facility] (ASA III) in 12/2018 without documented complications.      09/17/2021    3:15 PM 09/10/2021   10:57 AM 08/30/2021    1:46 PM  Vitals with BMI  Height 5' 5"  5' 5"  5' 5"   Weight 152 lbs 155 lbs 153 lbs 8 oz  BMI 25.29 84.66 59.93  Systolic  570 177  Diastolic  78 80  Pulse  82 67    Providers/Specialists:   NOTE: Primary physician provider listed below. Patient may have been seen by APP or partner within same practice.   PROVIDER ROLE / SPECIALTY LAST OV  Bary Castilla Forest Gleason, MD General Surgery (Surgeon) 06/29/2021  [name], MD Primary Care Provider 09/10/2021  Katrine Coho, MD Cardiology 09/10/2021  Jennings Books, MD Neurology 09/12/2021  Randa Evens, MD Medical Oncology 08/30/2021   Allergies:  Sulfamethoxazole-trimethoprim  Current Home Medications:   No current facility-administered medications for this encounter.    aspirin 81 MG EC tablet   atorvastatin (LIPITOR) 40 MG tablet   buPROPion (WELLBUTRIN SR) 150 MG 12 hr tablet   citalopram (CELEXA) 40 MG tablet   clopidogrel (PLAVIX) 75 MG tablet   Cyanocobalamin (VITAMIN B-12) 1000 MCG SUBL  levothyroxine (SYNTHROID) 75 MCG tablet   lidocaine-prilocaine (EMLA) cream   Multiple Vitamin (MULTIVITAMIN WITH MINERALS) TABS tablet   polyvinyl alcohol (LIQUIFILM TEARS) 1.4 % ophthalmic solution   Ubrogepant (UBRELVY) 100 MG TABS    Vitamin D, Ergocalciferol, (DRISDOL) 1.25 MG (50000 UNIT) CAPS capsule   History:   Past Medical History:  Diagnosis Date   Acute bronchospasm due to viral infection 2020   Due to pollen   Acute ischemic left MCA stroke ([place]) 04/19/2020   a.) CTA head/neck 04/19/2020 --> nearly occlusive thrombus at bifurcation of a proximal M2 MCA branch approximately 6 mm from the MCA bifurcation. Subsequent occlusion of a proximal left M3 MCA branch   Allergy    Anemia    Angina at rest [facility])    Breast cancer, left ([place]) 08/21/2021   a.) stereotactic Bx 08/21/2021 --> IMC (G1, ER/PR +, Her2/neu -)   Cataract    a.) s/p extraction with IOL placement   Depressive disorder    Diverticulosis    Dyspnea    Epistaxis    Fatigue    Hearing loss    History of 2019 novel coronavirus disease (COVID-19) 12/18/2020   History of ischemic multifocal posterior circulation stroke 02/28/2020   a.) presented to ED with increasing episodes of  transient vertical binocular diplopia; MRI 02/27/2021 --> multiple punctate acute to subacute ischemic infarcts involving the cortical/subcortical aspects of both parieto-occipital regions   History of loop recorder    HLD (hyperlipidemia)    Hypothyroidism    LAFB (left anterior fascicular block)    Long term current use of antithrombotics/antiplatelets    a.) on DAPT therapy (ASA + clopidogrel)   Memory change    Migraine    Osteoarthritis    Osteoporosis    Palpitations    Premature menopause    QT prolongation    Seizures ([place]) 1994   history of mini seizures, possible migraine induced   Stage I adenocarcinoma of endometrium ([place]) 12/18/2020   a.) stage 1A; grade 1 (pT1a, pN0); b.) s/p TLH/BSO 12/18/2020 + adjuvant vaginal brachytherapy (30 Gy over 5 fractions between 02/11/2021 - 03/14/2021)   Vitamin D deficiency    Past Surgical History:  Procedure Laterality Date   ABDOMINAL HYSTERECTOMY  12/18/2020   ABLATION  2006   BREAST BIOPSY Left 08/21/2021    Stereo Bx, X-clip; path --> IMC (G1, ER/PR +, Her2/neu -)   BUBBLE STUDY  04/23/2020   Procedure: BUBBLE STUDY;  Surgeon: Larey Dresser, MD;  Location: [place];  Service: Cardiovascular;;   CATARACT EXTRACTION W/ INTRAOCULAR LENS IMPLANT Bilateral    COLONOSCOPY     COLONOSCOPY WITH PROPOFOL N/A 12/22/2018   Procedure: COLONOSCOPY WITH PROPOFOL;  Surgeon: Virgel Manifold, MD;  Location: [facility] ENDOSCOPY;  Service: Endoscopy;  Laterality: N/A;   EYE SURGERY  Spring 2018   Cataracts both eyes   HYSTEROSCOPY WITH D & C N/A 11/01/2020   Procedure: DILATATION AND CURETTAGE /HYSTEROSCOPY WITH MYOSURE;  Surgeon: Lafonda Mosses, MD;  Location: [facility];  Service: Gynecology;  Laterality: N/A;   LAPAROTOMY  12/18/2020   Procedure: LAPAROTOMY;  Surgeon: Lafonda Mosses, MD;  Location: [facility];  Service: Gynecology;;   LOOP RECORDER INSERTION N/A 04/23/2020   Procedure: LOOP RECORDER INSERTION;  Surgeon: Evans Lance, MD;  Location: [place] [facility];  Service: Cardiovascular;  Laterality: N/A;   ROBOTIC ASSISTED TOTAL HYSTERECTOMY WITH BILATERAL SALPINGO OOPHERECTOMY Bilateral 12/18/2020   Procedure: XI ROBOTIC ASSISTED TOTAL HYSTERECTOMY WITH BILATERAL  SALPINGO OOPHORECTOMY;  Surgeon: Lafonda Mosses, MD;  Location: [facility];  Service: Gynecology;  Laterality: Bilateral;   SENTINEL NODE BIOPSY N/A 12/18/2020   Procedure: SENTINEL NODE BIOPSY;  Surgeon: Lafonda Mosses, MD;  Location: [facility];  Service: Gynecology;  Laterality: N/A;   TEE WITHOUT CARDIOVERSION N/A 04/23/2020   Procedure: TRANSESOPHAGEAL ECHOCARDIOGRAM (TEE);  Surgeon: Larey Dresser, MD;  Location: [facility] ENDOSCOPY;  Service: Cardiovascular;  Laterality: N/A;   TONSILLECTOMY     Family History  Problem Relation Age of Onset   Early death Father        suicide   Depression Father    Diabetes Father    Cancer Father        prostate   Hearing loss Father        due to war   Alzheimer's disease Mother     Heart disease Brother    Atrial fibrillation Brother    Heart disease Maternal Aunt    Dementia Maternal Aunt    Heart disease Maternal Uncle    Dementia Maternal Grandmother    Heart disease Brother    Atrial fibrillation Brother    Colon cancer Neg Hx    Breast cancer Neg Hx    Ovarian cancer Neg Hx    Pancreatic cancer Neg Hx    Endometrial cancer Neg Hx    Social History   Tobacco Use   Smoking status: Former    Packs/day: 0.50    Years: 6.00    Total pack years: 3.00    Types: Cigarettes    Quit date: 02/11/1974    Years since quitting: 47.6   Smokeless tobacco: Never  Vaping Use   Vaping Use: Never used  Substance Use Topics   Alcohol use: Not Currently   Drug use: Never    Pertinent Clinical Results:  LABS: Labs reviewed: Acceptable for surgery.  Lab Results  Component Value Date   WBC 3.7 (L) 09/10/2021   HGB 12.3 09/10/2021   HCT 36.7 09/10/2021   MCV 95.6 09/10/2021   PLT 174 09/10/2021   Lab Results  Component Value Date   NA 139 03/13/2021   K 4.1 03/13/2021   CO2 29 03/13/2021   GLUCOSE 86 03/13/2021   BUN 22 03/13/2021   CREATININE 0.74 03/13/2021   CALCIUM 9.3 03/13/2021   EGFR 86 03/13/2021   GFRNONAA >60 12/20/2020    ECG: Date: 03/13/2021 Rate: 69 bpm Rhythm:  Normal sinus rhythm; LAFB Axis (leads I and aVF): Normal Intervals: PR 198 ms. QRS 106 ms. QTc 420 ms. ST segment and T wave changes: No evidence of acute ST segment elevation or depression.  Evidence of an age undetermined septal infarct present. Comparison: Similar to previous tracing obtained on 08/05/2020 NOTE: Tracing obtained at [facility]; unable for review. Above based on cardiologist's interpretation.    IMAGING / PROCEDURES: TRANSESOPHAGEAL ECHOCARDIOGRAM performed on 04/23/2020 Left ventricular ejection fraction, by estimation, is 55 to 60%. The left ventricle has normal function. The left ventricle has no regional wall motion abnormalities.  Right ventricular  systolic function is normal. The right ventricular size is normal.  Left atrial size was moderately dilated. No left atrial/left atrial appendage thrombus was detected.  The mitral valve is normal in structure. Trivial mitral valve regurgitation. No evidence of mitral stenosis.  The aortic valve is tricuspid. Aortic valve regurgitation is trivial. No aortic stenosis is present.  Normal caliber thoracic aorta with minimal plaque.  No PFO or ASD by color doppler  or bubble study.   MYOCARDIAL PERFUSION IMAGING STUDY (LEXISCAN) performed on 03/19/2020 Normal left ventricular systolic function with an EF of 64% Normal myocardial thickening and wall motion Normal left ventricular cavity size No artifact SPECT images demonstrate homogenous tracer distribution throughout the myocardium No evidence of stress-induced myocardial ischemia or arrhythmia Normal low risk study  TRANSTHORACIC ECHOCARDIOGRAM performed on 02/29/2020 Left ventricular ejection fraction, by estimation, is 60 to 65%. The left ventricle has normal function. The left ventricle has no regional wall motion abnormalities. Left ventricular diastolic parameters were normal.  Right ventricular systolic function is normal. The right ventricular size is normal.  The mitral valve is grossly normal. Trivial mitral valve regurgitation.  The aortic valve is grossly normal. Aortic valve regurgitation is trivial.   Impression and Plan:  Natalie [name] has been referred for pre-anesthesia review and clearance prior to her undergoing the planned anesthetic and procedural courses. Available labs, pertinent testing, and imaging results were personally reviewed by me. This patient has been appropriately cleared by cardiology (MODERATE) and by neurology (LOW) with the individually indicated risk of significant perioperative complications.  Based on clinical review performed today (09/20/21), barring any significant acute changes in the patient's  overall condition, it is anticipated that she will be able to proceed with the planned surgical intervention. Any acute changes in clinical condition may necessitate her procedure being postponed and/or cancelled. Patient will meet with anesthesia team (MD and/or CRNA) on the day of her procedure for preoperative evaluation/assessment. Questions regarding anesthetic course will be fielded at that time.   Pre-surgical instructions were reviewed with the patient during her PAT appointment and questions were fielded by PAT clinical staff. Patient was advised that if any questions or concerns arise prior to her procedure then she should return a call to PAT and/or her surgeon's office to discuss.  Honor Loh, MSN, APRN, FNP-C, CEN [facility]  Peri-operative Services Nurse Practitioner Phone: [phone] Fax: [phone] 09/20/21 12:54 PM  NOTE: This note has been prepared using Dragon dictation software. Despite my best ability to proofread, there is always the potential that unintentional transcriptional errors may still occur from this process.

## 2021-09-21 ENCOUNTER — Other Ambulatory Visit: Payer: Self-pay | Admitting: Family Medicine

## 2021-09-21 DIAGNOSIS — E785 Hyperlipidemia, unspecified: Secondary | ICD-10-CM

## 2021-09-21 DIAGNOSIS — Z8673 Personal history of transient ischemic attack (TIA), and cerebral infarction without residual deficits: Secondary | ICD-10-CM

## 2021-09-23 ENCOUNTER — Encounter: Payer: Self-pay | Admitting: Family Medicine

## 2021-09-23 ENCOUNTER — Other Ambulatory Visit: Payer: Medicare Other

## 2021-09-24 DIAGNOSIS — G3184 Mild cognitive impairment, so stated: Secondary | ICD-10-CM | POA: Diagnosis not present

## 2021-09-25 ENCOUNTER — Ambulatory Visit
Admission: RE | Admit: 2021-09-25 | Discharge: 2021-09-25 | Disposition: A | Discharge status: Home / Self Care | Payer: Medicare Other | Source: Ambulatory Visit | Attending: General Surgery | Admitting: General Surgery

## 2021-09-25 ENCOUNTER — Encounter
Admission: RE | Disposition: A | Discharge status: Home / Self Care | Payer: Self-pay | Source: Home / Self Care | Attending: General Surgery

## 2021-09-25 ENCOUNTER — Other Ambulatory Visit: Payer: Self-pay

## 2021-09-25 ENCOUNTER — Ambulatory Visit: Payer: Medicare Other | Admitting: Urgent Care

## 2021-09-25 ENCOUNTER — Ambulatory Visit
Admission: RE | Admit: 2021-09-25 | Discharge: 2021-09-25 | Disposition: A | Discharge status: Home / Self Care | Payer: Medicare Other | Attending: General Surgery | Admitting: General Surgery

## 2021-09-25 ENCOUNTER — Encounter: Payer: Self-pay | Admitting: General Surgery

## 2021-09-25 DIAGNOSIS — E785 Hyperlipidemia, unspecified: Secondary | ICD-10-CM | POA: Insufficient documentation

## 2021-09-25 DIAGNOSIS — I69911 Memory deficit following unspecified cerebrovascular disease: Secondary | ICD-10-CM | POA: Diagnosis not present

## 2021-09-25 DIAGNOSIS — F32A Depression, unspecified: Secondary | ICD-10-CM | POA: Insufficient documentation

## 2021-09-25 DIAGNOSIS — R519 Headache, unspecified: Secondary | ICD-10-CM | POA: Insufficient documentation

## 2021-09-25 DIAGNOSIS — C50912 Malignant neoplasm of unspecified site of left female breast: Secondary | ICD-10-CM

## 2021-09-25 DIAGNOSIS — D759 Disease of blood and blood-forming organs, unspecified: Secondary | ICD-10-CM | POA: Insufficient documentation

## 2021-09-25 DIAGNOSIS — Z170 Estrogen receptor positive status [ER+]: Secondary | ICD-10-CM | POA: Insufficient documentation

## 2021-09-25 DIAGNOSIS — E039 Hypothyroidism, unspecified: Secondary | ICD-10-CM | POA: Insufficient documentation

## 2021-09-25 DIAGNOSIS — D649 Anemia, unspecified: Secondary | ICD-10-CM | POA: Diagnosis not present

## 2021-09-25 DIAGNOSIS — I639 Cerebral infarction, unspecified: Secondary | ICD-10-CM | POA: Diagnosis not present

## 2021-09-25 DIAGNOSIS — Z87891 Personal history of nicotine dependence: Secondary | ICD-10-CM | POA: Diagnosis not present

## 2021-09-25 DIAGNOSIS — C50212 Malignant neoplasm of upper-inner quadrant of left female breast: Secondary | ICD-10-CM | POA: Diagnosis not present

## 2021-09-25 HISTORY — DX: Long term (current) use of antithrombotics/antiplatelets: Z79.02

## 2021-09-25 HISTORY — DX: Other forms of angina pectoris: I20.89

## 2021-09-25 HISTORY — DX: Diverticulosis of intestine, part unspecified, without perforation or abscess without bleeding: K57.90

## 2021-09-25 HISTORY — PX: BREAST LUMPECTOMY WITH NEEDLE LOCALIZATION: SHX5759

## 2021-09-25 HISTORY — DX: Other forms of angina pectoris: I20.8

## 2021-09-25 HISTORY — DX: Palpitations: R00.2

## 2021-09-25 HISTORY — DX: Vitamin D deficiency, unspecified: E55.9

## 2021-09-25 HISTORY — PX: AXILLARY SENTINEL NODE BIOPSY: SHX5738

## 2021-09-25 HISTORY — DX: Abnormal electrocardiogram (ECG) (EKG): R94.31

## 2021-09-25 HISTORY — DX: Left anterior fascicular block: I44.4

## 2021-09-25 HISTORY — DX: Unspecified cataract: H26.9

## 2021-09-25 HISTORY — PX: BREAST LUMPECTOMY: SHX2

## 2021-09-25 HISTORY — DX: Hyperlipidemia, unspecified: E78.5

## 2021-09-25 SURGERY — BREAST LUMPECTOMY WITH NEEDLE LOCALIZATION
Anesthesia: General | Site: Breast | Laterality: Left

## 2021-09-25 MED ORDER — OXYCODONE HCL 5 MG PO TABS: 5.0000 mg | ORAL_TABLET | Freq: Once | ORAL | Status: DC | PRN

## 2021-09-25 MED ORDER — LACTATED RINGERS IV SOLN: INTRAVENOUS | Status: DC

## 2021-09-25 MED ORDER — DEXAMETHASONE SODIUM PHOSPHATE 10 MG/ML IJ SOLN
INTRAMUSCULAR | Status: DC | PRN
  Administered 2021-09-25: 10 mg via INTRAVENOUS

## 2021-09-25 MED ORDER — EPHEDRINE SULFATE (PRESSORS) 50 MG/ML IJ SOLN
INTRAMUSCULAR | Status: DC | PRN
  Administered 2021-09-25: 5 mg via INTRAVENOUS

## 2021-09-25 MED ORDER — PROPOFOL 10 MG/ML IV BOLUS
INTRAVENOUS | Status: DC | PRN
  Administered 2021-09-25: 100 mg via INTRAVENOUS
  Administered 2021-09-25: 30 mg via INTRAVENOUS

## 2021-09-25 MED ORDER — LACTATED RINGERS IV SOLN
INTRAVENOUS | Status: DC
Start: 2021-09-25 — End: 2021-09-25

## 2021-09-25 MED ORDER — ACETAMINOPHEN 10 MG/ML IV SOLN
INTRAVENOUS | Status: AC
  Filled 2021-09-25: qty 100

## 2021-09-25 MED ORDER — CHLORHEXIDINE GLUCONATE 0.12 % MT SOLN: 15.0000 mL | Freq: Once | OROMUCOSAL | Status: AC

## 2021-09-25 MED ORDER — CLOPIDOGREL BISULFATE 75 MG PO TABS: 75.0000 mg | ORAL_TABLET | Freq: Every day | ORAL | 0 refills | Status: DC

## 2021-09-25 MED ORDER — CHLORHEXIDINE GLUCONATE CLOTH 2 % EX PADS
6.0000 | MEDICATED_PAD | Freq: Once | CUTANEOUS | Status: AC
  Administered 2021-09-25: 6 via TOPICAL

## 2021-09-25 MED ORDER — FENTANYL CITRATE (PF) 100 MCG/2ML IJ SOLN: 25.0000 ug | INTRAMUSCULAR | Status: DC | PRN

## 2021-09-25 MED ORDER — HYDROCODONE-ACETAMINOPHEN 5-325 MG PO TABS: 1.0000 | ORAL_TABLET | ORAL | 0 refills | Status: DC | PRN

## 2021-09-25 MED ORDER — GLYCOPYRROLATE 0.2 MG/ML IJ SOLN
INTRAMUSCULAR | Status: DC | PRN
  Administered 2021-09-25: .2 mg via INTRAVENOUS

## 2021-09-25 MED ORDER — STERILE WATER FOR IRRIGATION IR SOLN
Status: DC | PRN
  Administered 2021-09-25: 500 mL

## 2021-09-25 MED ORDER — BUPIVACAINE-EPINEPHRINE (PF) 0.5% -1:200000 IJ SOLN
INTRAMUSCULAR | Status: DC | PRN
  Administered 2021-09-25: 20 mL
  Administered 2021-09-25: 10 mL

## 2021-09-25 MED ORDER — BUPIVACAINE-EPINEPHRINE (PF) 0.5% -1:200000 IJ SOLN
INTRAMUSCULAR | Status: AC
  Filled 2021-09-25: qty 30

## 2021-09-25 MED ORDER — METHYLENE BLUE 1 % INJ SOLN
INTRAVENOUS | Status: AC
Start: 2021-09-25 — End: ?
  Filled 2021-09-25: qty 10

## 2021-09-25 MED ORDER — FENTANYL CITRATE (PF) 100 MCG/2ML IJ SOLN
INTRAMUSCULAR | Status: AC
  Filled 2021-09-25: qty 2

## 2021-09-25 MED ORDER — OXYCODONE HCL 5 MG/5ML PO SOLN: 5.0000 mg | Freq: Once | ORAL | Status: DC | PRN

## 2021-09-25 MED ORDER — PHENYLEPHRINE HCL-NACL 20-0.9 MG/250ML-% IV SOLN
INTRAVENOUS | Status: DC | PRN
  Administered 2021-09-25: 30 ug/min via INTRAVENOUS

## 2021-09-25 MED ORDER — DEXMEDETOMIDINE HCL IN NACL 200 MCG/50ML IV SOLN
INTRAVENOUS | Status: DC | PRN
  Administered 2021-09-25: 8 ug via INTRAVENOUS

## 2021-09-25 MED ORDER — METHYLENE BLUE 1 % INJ SOLN
INTRAVENOUS | Status: DC | PRN
  Administered 2021-09-25: 2 mL

## 2021-09-25 MED ORDER — ONDANSETRON HCL 4 MG/2ML IJ SOLN
INTRAMUSCULAR | Status: DC | PRN
  Administered 2021-09-25: 4 mg via INTRAVENOUS

## 2021-09-25 MED ORDER — ONDANSETRON HCL 4 MG/2ML IJ SOLN: 4.0000 mg | Freq: Once | INTRAMUSCULAR | Status: DC | PRN

## 2021-09-25 MED ORDER — FENTANYL CITRATE (PF) 100 MCG/2ML IJ SOLN
INTRAMUSCULAR | Status: DC | PRN
  Administered 2021-09-25 (×4): 25 ug via INTRAVENOUS

## 2021-09-25 MED ORDER — ACETAMINOPHEN 10 MG/ML IV SOLN
INTRAVENOUS | Status: DC | PRN
  Administered 2021-09-25: 1000 mg via INTRAVENOUS

## 2021-09-25 MED ORDER — FAMOTIDINE 20 MG PO TABS
ORAL_TABLET | ORAL | Status: AC
  Administered 2021-09-25: 20 mg via ORAL
  Filled 2021-09-25: qty 1

## 2021-09-25 MED ORDER — TECHNETIUM TC 99M TILMANOCEPT KIT
1.0000 | PACK | Freq: Once | INTRAVENOUS | Status: AC
  Administered 2021-09-25: 1.05 via INTRADERMAL

## 2021-09-25 MED ORDER — FAMOTIDINE 20 MG PO TABS: 20.0000 mg | ORAL_TABLET | Freq: Once | ORAL | Status: AC

## 2021-09-25 MED ORDER — CHLORHEXIDINE GLUCONATE 0.12 % MT SOLN
OROMUCOSAL | Status: AC
  Administered 2021-09-25: 15 mL via OROMUCOSAL
  Filled 2021-09-25: qty 15

## 2021-09-25 MED ORDER — LIDOCAINE HCL (CARDIAC) PF 100 MG/5ML IV SOSY
PREFILLED_SYRINGE | INTRAVENOUS | Status: DC | PRN
  Administered 2021-09-25: 100 mg via INTRAVENOUS

## 2021-09-25 MED ORDER — ORAL CARE MOUTH RINSE: 15.0000 mL | Freq: Once | OROMUCOSAL | Status: AC

## 2021-09-25 MED ORDER — ACETAMINOPHEN 10 MG/ML IV SOLN: 1000.0000 mg | Freq: Once | INTRAVENOUS | Status: DC | PRN

## 2021-09-25 SURGICAL SUPPLY — 60 items
APL PRP STRL LF DISP 70% ISPRP (MISCELLANEOUS) ×1
BLADE BOVIE TIP EXT 4 (BLADE) IMPLANT
BLADE SURG 15 STRL SS SAFETY (BLADE) ×2 IMPLANT
BULB RESERV EVAC DRAIN JP 100C (MISCELLANEOUS) IMPLANT
CHLORAPREP W/TINT 26 (MISCELLANEOUS) ×1 IMPLANT
CNTNR SPEC 2.5X3XGRAD LEK (MISCELLANEOUS)
CONT SPEC 4OZ STER OR WHT (MISCELLANEOUS)
CONT SPEC 4OZ STRL OR WHT (MISCELLANEOUS)
CONTAINER SPEC 2.5X3XGRAD LEK (MISCELLANEOUS) IMPLANT
COVER PROBE FLX POLY STRL (MISCELLANEOUS) ×1 IMPLANT
COVER PROBE GAMMA FINDER SLV (MISCELLANEOUS) IMPLANT
DEVICE DUBIN SPECIMEN MAMMOGRA (MISCELLANEOUS) ×1 IMPLANT
DRAIN CHANNEL JP 15F RND 16 (MISCELLANEOUS) IMPLANT
DRAPE LAPAROTOMY TRNSV 106X77 (MISCELLANEOUS) ×1 IMPLANT
DRSG GAUZE FLUFF 36X18 (GAUZE/BANDAGES/DRESSINGS) ×2 IMPLANT
DRSG TELFA 3X8 NADH (GAUZE/BANDAGES/DRESSINGS) ×2 IMPLANT
ELECT CAUTERY BLADE TIP 2.5 (TIP) ×1
ELECT REM PT RETURN 9FT ADLT (ELECTROSURGICAL) ×1
ELECTRODE CAUTERY BLDE TIP 2.5 (TIP) ×1 IMPLANT
ELECTRODE REM PT RTRN 9FT ADLT (ELECTROSURGICAL) ×1 IMPLANT
GAUZE 4X4 16PLY ~~LOC~~+RFID DBL (SPONGE) ×1 IMPLANT
GLOVE BIO SURGEON STRL SZ7.5 (GLOVE) ×1 IMPLANT
GLOVE SURG UNDER LTX SZ8 (GLOVE) ×1 IMPLANT
GOWN STRL REUS W/ TWL LRG LVL3 (GOWN DISPOSABLE) ×2 IMPLANT
GOWN STRL REUS W/TWL LRG LVL3 (GOWN DISPOSABLE) ×2
KIT TURNOVER KIT A (KITS) ×1 IMPLANT
LABEL OR SOLS (LABEL) ×1 IMPLANT
MANIFOLD NEPTUNE II (INSTRUMENTS) ×1 IMPLANT
MARGIN MAP 10MM (MISCELLANEOUS) ×1 IMPLANT
NDL HYPO 25X1 1.5 SAFETY (NEEDLE) ×2 IMPLANT
NDL SPNL 20GX3.5 QUINCKE YW (NEEDLE) IMPLANT
NEEDLE HYPO 22GX1.5 SAFETY (NEEDLE) ×1 IMPLANT
NEEDLE HYPO 25X1 1.5 SAFETY (NEEDLE) ×2 IMPLANT
NEEDLE SPNL 20GX3.5 QUINCKE YW (NEEDLE) IMPLANT
PACK BASIN MINOR ARMC (MISCELLANEOUS) ×1 IMPLANT
PAD DRESSING TELFA 3X8 NADH (GAUZE/BANDAGES/DRESSINGS) ×1 IMPLANT
RETRACTOR RING XSMALL (MISCELLANEOUS) IMPLANT
RTRCTR WOUND ALEXIS 13CM XS SH (MISCELLANEOUS)
SHEARS FOC LG CVD HARMONIC 17C (MISCELLANEOUS) IMPLANT
SHEARS HARMONIC 9CM CVD (BLADE) IMPLANT
STRIP CLOSURE SKIN 1/2X4 (GAUZE/BANDAGES/DRESSINGS) ×1 IMPLANT
SUT ETHILON 3-0 FS-10 30 BLK (SUTURE) ×1
SUT SILK 2 0 (SUTURE) ×1
SUT SILK 2-0 18XBRD TIE 12 (SUTURE) ×1 IMPLANT
SUT VIC AB 2-0 CT1 27 (SUTURE) ×3
SUT VIC AB 2-0 CT1 TAPERPNT 27 (SUTURE) ×2 IMPLANT
SUT VIC AB 3-0 54X BRD REEL (SUTURE) ×1 IMPLANT
SUT VIC AB 3-0 BRD 54 (SUTURE) ×1
SUT VIC AB 3-0 SH 27 (SUTURE) ×2
SUT VIC AB 3-0 SH 27X BRD (SUTURE) ×2 IMPLANT
SUT VIC AB 4-0 FS2 27 (SUTURE) ×2 IMPLANT
SUTURE EHLN 3-0 FS-10 30 BLK (SUTURE) ×1 IMPLANT
SWABSTK COMLB BENZOIN TINCTURE (MISCELLANEOUS) ×1 IMPLANT
SYR 10ML LL (SYRINGE) ×1 IMPLANT
SYR BULB IRRIG 60ML STRL (SYRINGE) ×1 IMPLANT
TAPE TRANSPORE STRL 2 31045 (GAUZE/BANDAGES/DRESSINGS) ×1 IMPLANT
TRAP FLUID SMOKE EVACUATOR (MISCELLANEOUS) ×1 IMPLANT
TRAP NEPTUNE SPECIMEN COLLECT (MISCELLANEOUS) ×1 IMPLANT
WATER STERILE IRR 1000ML POUR (IV SOLUTION) ×1 IMPLANT
WATER STERILE IRR 500ML POUR (IV SOLUTION) ×1 IMPLANT

## 2021-09-25 NOTE — Transfer of Care (Signed)
Immediate Anesthesia Transfer of Care Note  Patient: [name]  Procedure(s) Performed: BREAST LUMPECTOMY WITH NEEDLE LOCALIZATION (Left: Breast) AXILLARY SENTINEL NODE BIOPSY (Left: Breast)  Patient Location: PACU  Anesthesia Type:General  Level of Consciousness: awake, alert  and oriented  Airway & Oxygen Therapy: Patient Spontanous Breathing and Patient connected to face mask oxygen  Post-op Assessment: Report given to RN and Post -op Vital signs reviewed and stable  Post vital signs: Reviewed and stable  Last Vitals:  Vitals Value Taken Time  BP 135/61   Temp    Pulse 77 09/25/21 1418  Resp 12   SpO2 100 % 09/25/21 1418  Vitals shown include unvalidated device data.  Last Pain:  Vitals:   09/25/21 1017  TempSrc: Temporal  PainSc: 0-No pain         Complications: No notable events documented.

## 2021-09-25 NOTE — Op Note (Addendum)
Preoperative diagnosis: Left breast cancer.  Postoperative diagnosis: Same.  Operative procedure: Left breast wide excision with wire localization and ultrasound guidance, sentinel node biopsy.  Operating surgeon: Hervey Ard, MD.  Anesthesia: General by LMA, Marcaine 0.5% with 1: 200,000 units of epinephrine.  Estimated blood loss: Less than 3 5 cc.  Clinical note: This 71 year old woman recently had a mammographic abnormality identified and biopsy showed a tubular carcinoma.  She desired breast conservation.  She is felt to be a candidate for wide excision.  Has she is just barely over age 57 she was offered and accepted sentinel node biopsy.  The patient had SCD stockings for DVT prevention.  Wire localization was completed by the radiology service prior to the procedure.  Operative note: The patient underwent general anesthesia and tolerated this well.  2 cc of 1 half-strength methylene blue was injected in subareolar plexus.  The breast and axilla was then cleansed with ChloraPrep and draped.  Ultrasound was used to identify the biopsy cavity and wire location.  Field block anesthesia was established.  A circumareolar incision from the 10 to 2 o'clock position was made.  The skin was incised sharply remaining dissection electrocautery.  The entire wire tip with intact clip was removed.  The clip was towards the medial aspect of the specimen on radiograph.  While the specimen was being processed attention was turned to the axilla.  Making use of the node seeker increased uptake was noted at the inferior to mid aspect of the axilla.  Local anesthesia was infiltrated.  A transverse incision was made.  The axillary envelope was opened and a 1 cm blue hot lymph node with counts of 3200 was removed.  Adjacent to this was a smaller 6 mm node.  Scanning through the axilla after these 2 nodes were removed showed no activity over 200.  The axillary envelope was closed with interrupted 2-0 Vicryl  figure-of-eight sutures.  The adipose layer was approximated similar fashion.  The skin was closed with a running 4-0 Vicryl subcuticular suture.  The pathologist reported the fibrotic area was then near the medial border and requested an additional sample.  The medial border was reexcised with a 5 mm margin in this 2.5 x 2.5 cm of tissue was orientated with the new medial margin on a Telfa pad.  This was sent in formalin for routine histology.  The deep tissue was mobilized circumferentially to allow obliteration of the dead space.  This was completed with 2-0 Vicryl figure-of-eight sutures.  The breast and adipose layer were approximated in similar fashion.  The skin was closed with a running 4-0 Vicryl subcuticular suture.  Benzoin, Steri-Strips, Telfa, fluff gauze and a compressive wrap were applied.  The patient tolerated procedure well and was taken to PACU in stable condition.

## 2021-09-25 NOTE — H&P (Signed)
Natalie [name] [id number] 10-Aug-1950     HPI:  71 y/o woman with early breast cancer. For excision.   Medications Prior to Admission  Medication Sig Dispense Refill Last Dose   aspirin 81 MG EC tablet Take by mouth.   09/24/2021   atorvastatin (LIPITOR) 40 MG tablet Take 1 tablet (40 mg total) by mouth daily. 90 tablet 1 09/24/2021   buPROPion (WELLBUTRIN SR) 150 MG 12 hr tablet Take 2 tablets (300 mg total) by mouth daily. 180 tablet 1 09/25/2021   citalopram (CELEXA) 40 MG tablet TAKE 1 TABLET BY MOUTH EVERY DAY 90 tablet 1 09/24/2021   clopidogrel (PLAVIX) 75 MG tablet Take 75 mg by mouth daily.   09/19/2021   Cyanocobalamin (VITAMIN B-12) 1000 MCG SUBL Place 1 tablet (1,000 mcg total) under the tongue 2 (two) times a week. 30 tablet 0 09/24/2021   levothyroxine (SYNTHROID) 75 MCG tablet TAKE 1 TABLET(75 MCG) BY MOUTH DAILY BEFORE BREAKFAST 90 tablet 1 09/25/2021   lidocaine-prilocaine (EMLA) cream Apply to areola one hour prior to arrival day of procedure. Cover with saran wrap.   09/25/2021   Multiple Vitamin (MULTIVITAMIN WITH MINERALS) TABS tablet Take 1 tablet by mouth daily.   09/24/2021   polyvinyl alcohol (LIQUIFILM TEARS) 1.4 % ophthalmic solution Place 1 drop into both eyes daily as needed for dry eyes.   Past Month   Ubrogepant (UBRELVY) 100 MG TABS Take 100 mg by mouth daily as needed (migraines).   Past Month   Vitamin D, Ergocalciferol, (DRISDOL) 1.25 MG (50000 UNIT) CAPS capsule Take 1 capsule (50,000 Units total) by mouth every 14 (fourteen) days. 6 capsule 1 09/23/2021   Allergies  Allergen Reactions   Sulfamethoxazole-Trimethoprim Hives   Past Medical History:  Diagnosis Date   Acute bronchospasm due to viral infection 2020   Due to pollen   Acute ischemic left MCA stroke ([place]) 04/19/2020   a.) CTA head/neck 04/19/2020 --> nearly occlusive thrombus at bifurcation of a proximal M2 MCA branch approximately 6 mm from the MCA bifurcation. Subsequent occlusion of a proximal  left M3 MCA branch   Allergy    Anemia    Angina at rest [facility])    Breast cancer, left ([place]) 08/21/2021   a.) stereotactic Bx 08/21/2021 --> IMC (G1, ER/PR +, Her2/neu -)   Cataract    a.) s/p extraction with IOL placement   Depressive disorder    Diverticulosis    Dyspnea    Epistaxis    Fatigue    Hearing loss    History of 2019 novel coronavirus disease (COVID-19) 12/18/2020   History of ischemic multifocal posterior circulation stroke 02/28/2020   a.) presented to ED with increasing episodes of  transient vertical binocular diplopia; MRI 02/27/2021 --> multiple punctate acute to subacute ischemic infarcts involving the cortical/subcortical aspects of both parieto-occipital regions   History of loop recorder    HLD (hyperlipidemia)    Hypothyroidism    LAFB (left anterior fascicular block)    Long term current use of antithrombotics/antiplatelets    a.) on DAPT therapy (ASA + clopidogrel)   Memory change    Migraine    Osteoarthritis    Osteoporosis    Palpitations    Premature menopause    QT prolongation    Seizures ([place]) 1994   history of mini seizures, possible migraine induced   Stage I adenocarcinoma of endometrium ([place]) 12/18/2020   a.) stage 1A; grade 1 (pT1a, pN0); b.) s/p TLH/BSO 12/18/2020 + adjuvant vaginal brachytherapy (30  Gy over 5 fractions between 02/11/2021 - 03/14/2021)   Vitamin D deficiency    Past Surgical History:  Procedure Laterality Date   ABDOMINAL HYSTERECTOMY  12/18/2020   ABLATION  2006   BREAST BIOPSY Left 08/21/2021   Stereo Bx, X-clip; path --> IMC (G1, ER/PR +, Her2/neu -)   BUBBLE STUDY  04/23/2020   Procedure: BUBBLE STUDY;  Surgeon: Larey Dresser, MD;  Location: [facility] ENDOSCOPY;  Service: Cardiovascular;;   CATARACT EXTRACTION W/ INTRAOCULAR LENS IMPLANT Bilateral    COLONOSCOPY     COLONOSCOPY WITH PROPOFOL N/A 12/22/2018   Procedure: COLONOSCOPY WITH PROPOFOL;  Surgeon: Virgel Manifold, MD;  Location: [facility] ENDOSCOPY;   Service: Endoscopy;  Laterality: N/A;   EYE SURGERY  Spring 2018   Cataracts both eyes   HYSTEROSCOPY WITH D & C N/A 11/01/2020   Procedure: DILATATION AND CURETTAGE /HYSTEROSCOPY WITH MYOSURE;  Surgeon: Lafonda Mosses, MD;  Location: [facility] ORS;  Service: Gynecology;  Laterality: N/A;   LAPAROTOMY  12/18/2020   Procedure: LAPAROTOMY;  Surgeon: Lafonda Mosses, MD;  Location: [facility] ORS;  Service: Gynecology;;   LOOP RECORDER INSERTION N/A 04/23/2020   Procedure: LOOP RECORDER INSERTION;  Surgeon: Evans Lance, MD;  Location: [place] CV LAB;  Service: Cardiovascular;  Laterality: N/A;   ROBOTIC ASSISTED TOTAL HYSTERECTOMY WITH BILATERAL SALPINGO OOPHERECTOMY Bilateral 12/18/2020   Procedure: XI ROBOTIC ASSISTED TOTAL HYSTERECTOMY WITH BILATERAL SALPINGO OOPHORECTOMY;  Surgeon: Lafonda Mosses, MD;  Location: [facility] ORS;  Service: Gynecology;  Laterality: Bilateral;   SENTINEL NODE BIOPSY N/A 12/18/2020   Procedure: SENTINEL NODE BIOPSY;  Surgeon: Lafonda Mosses, MD;  Location: [facility] ORS;  Service: Gynecology;  Laterality: N/A;   TEE WITHOUT CARDIOVERSION N/A 04/23/2020   Procedure: TRANSESOPHAGEAL ECHOCARDIOGRAM (TEE);  Surgeon: Larey Dresser, MD;  Location: [facility] ENDOSCOPY;  Service: Cardiovascular;  Laterality: N/A;   TONSILLECTOMY     Social History   Socioeconomic History   Marital status: Single    Spouse name: Not on file   Number of children: 1   Years of education: Not on file   Highest education level: Bachelor's degree (e.g., BA, AB, BS)  Occupational History   Occupation: retired   Tobacco Use   Smoking status: Former    Packs/day: 0.50    Years: 6.00    Total pack years: 3.00    Types: Cigarettes    Quit date: 02/11/1974    Years since quitting: 47.6   Smokeless tobacco: Never  Vaping Use   Vaping Use: Never used  Substance and Sexual Activity   Alcohol use: Not Currently   Drug use: Never   Sexual activity: Not Currently    Comment: Don't use not  sexually active  Other Topics Concern   Not on file  Social History Narrative   Raised an adopted child on her own   Working part time reviewed documents   Social Determinants of Health   Financial Resource Strain: Low Risk  (04/02/2021)   Overall Financial Resource Strain (CARDIA)    Difficulty of Paying Living Expenses: Not hard at all  Food Insecurity: No Food Insecurity (04/02/2021)   Hunger Vital Sign    Worried About Running Out of Food in the Last Year: Never true    [place] in the Last Year: Never true  Transportation Needs: No Transportation Needs (04/02/2021)   PRAPARE - Hydrologist (Medical): No    Lack of Transportation (Non-Medical): No  Physical  Activity: Insufficiently Active (04/02/2021)   Exercise Vital Sign    Days of Exercise per Week: 2 days    Minutes of Exercise per Session: 30 min  Stress: No Stress Concern Present (04/02/2021)   [place]    Feeling of Stress : Not at all  Social Connections: Moderately Integrated (04/02/2021)   Social Connection and Isolation Panel [NHANES]    Frequency of Communication with Friends and Family: More than three times a week    Frequency of Social Gatherings with Friends and Family: Twice a week    Attends Religious Services: More than 4 times per year    Active Member of [employer] or Organizations: Yes    Attends Archivist Meetings: More than 4 times per year    Marital Status: Never married  Intimate Partner Violence: Not At Risk (04/02/2021)   Humiliation, Afraid, Rape, and Kick questionnaire    Fear of Current or Ex-Partner: No    Emotionally Abused: No    Physically Abused: No    Sexually Abused: No   Social History   Social History Narrative   Raised an adopted child on her own   Working part time reviewed documents     ROS: Negative.     PE: HEENT: Negative. Lungs: Clear. Cardio:  RR.  Assessment/Plan:  Proceed with planned left breast wide excision.    Forest Gleason [facility] 09/25/2021

## 2021-09-25 NOTE — Anesthesia Preprocedure Evaluation (Signed)
Anesthesia Evaluation  Patient identified by MRN, date of birth, ID band Patient awake    Reviewed: Allergy & Precautions, NPO status , Patient's Chart, lab work & pertinent test results  History of Anesthesia Complications Negative for: history of anesthetic complications  Airway Mallampati: III   Neck ROM: Full    Dental  (+) Missing, Implants   Pulmonary former smoker (quit 1976),    Pulmonary exam normal breath sounds clear to auscultation       Cardiovascular Exercise Tolerance: Good Normal cardiovascular exam Rhythm:Regular Rate:Normal  Palpitations   ECG 03/13/21:  Normal sinus rhythm  Left anterior fascicular block  Septal infarct (cited on or before 14-Oct-2019)  Abnormal ECG  When compared with ECG of 14-Oct-2019 10:46,  No significant change was found  Myocardial perfusion 03/19/20:  Normal myocardial perfusion scan no evidence of stress-induced myocardial ischemia ejection fraction of 64% conclusion negative scan   Neuro/Psych  Headaches, Seizures -,  PSYCHIATRIC DISORDERS Depression CVA (04/2020 with residual memory deficit; on Plavix)    GI/Hepatic negative GI ROS,   Endo/Other  Hypothyroidism   Renal/GU negative Renal ROS     Musculoskeletal  (+) Arthritis ,   Abdominal   Peds  Hematology  (+) Blood dyscrasia, anemia ,   Anesthesia Other Findings Cardiology note 09/10/21:  71 y.o. female with  1. History of CVA (cerebrovascular accident)  2. QT prolongation  3. Primary hypertension  4. Dyslipidemia  5. Unsteady gait  6. Chronic fatigue  7. Angina at rest (CMS-HCC)  8. SOB (shortness of breath)  9. Palpitation  10. Peripheral edema  11. Cerebrovascular accident (CVA) due to embolism of cerebral artery (CMS-HCC)   Plan  1 history of CVA no clear evidence of atrial fibrillation patient just on aspirin and Plavix at the patient follow-up with neurology 2 recent diagnosed with new breast  cancer to undergo radiation treatment no chemo as well as lumpectomy 3 generalized fatigue multifactorial persistent but reasonably stable continue conservative therapy 4 hyperlipidemia continue Lipitor therapy for lipid management 5 palpitations mild intermittent recurrent continue conservative management will consider beta-blocker Cardizem 6 mild ataxia unsteady on feet no recent falls consider physical therapy continue current medical management 7 have the patient follow-up in 1 year  Return in about 6 months (around 03/13/2022).   Reproductive/Obstetrics Endometrial CA s/p hysterectomy                             Anesthesia Physical Anesthesia Plan  ASA: 2  Anesthesia Plan: General   Post-op Pain Management:    Induction: Intravenous  PONV Risk Score and Plan: 3 and Ondansetron, Dexamethasone and Treatment may vary due to age or medical condition  Airway Management Planned: LMA  Additional Equipment:   Intra-op Plan:   Post-operative Plan: Extubation in OR  Informed Consent: I have reviewed the patients History and Physical, chart, labs and discussed the procedure including the risks, benefits and alternatives for the proposed anesthesia with the patient or authorized representative who has indicated his/her understanding and acceptance.     Dental advisory given  Plan Discussed with: CRNA  Anesthesia Plan Comments: (Patient consented for risks of anesthesia including but not limited to:  - adverse reactions to medications - damage to eyes, teeth, lips or other oral mucosa - nerve damage due to positioning  - sore throat or hoarseness - damage to heart, brain, nerves, lungs, other parts of body or loss of life  Informed patient  about role of CRNA in peri- and intra-operative care.  Patient voiced understanding.)        Anesthesia Quick Evaluation

## 2021-09-25 NOTE — Anesthesia Procedure Notes (Signed)
Procedure Name: LMA Insertion Date/Time: 09/25/2021 12:46 PM  Performed by: Lia Foyer, CRNAPre-anesthesia Checklist: Patient identified, Emergency Drugs available, Suction available and Patient being monitored Patient Re-evaluated:Patient Re-evaluated prior to induction Oxygen Delivery Method: Circle system utilized Preoxygenation: Pre-oxygenation with 100% oxygen Induction Type: IV induction Ventilation: Mask ventilation without difficulty LMA: LMA inserted LMA Size: 3.0 Number of attempts: 1 Placement Confirmation: positive ETCO2 and breath sounds checked- equal and bilateral Tube secured with: Tape Dental Injury: Teeth and Oropharynx as per pre-operative assessment

## 2021-09-25 NOTE — Discharge Instructions (Signed)
AMBULATORY SURGERY  DISCHARGE INSTRUCTIONS   The drugs that you were given will stay in your system until tomorrow so for the next 24 hours you should not:  Drive an automobile Make any legal decisions Drink any alcoholic beverage   You may resume regular meals tomorrow.  Today it is better to start with liquids and gradually work up to solid foods.  You may eat anything you prefer, but it is better to start with liquids, then soup and crackers, and gradually work up to solid foods.   Please notify your doctor immediately if you have any unusual bleeding, trouble breathing, redness and pain at the surgery site, drainage, fever, or pain not relieved by medication.       Please contact your physician with any problems or Same Day Surgery at [phone], Monday through Friday 6 am to 4 pm, or [place] at [place] Main number at [phone].  

## 2021-09-26 ENCOUNTER — Encounter: Payer: Self-pay | Admitting: General Surgery

## 2021-09-26 NOTE — Anesthesia Postprocedure Evaluation (Signed)
Anesthesia Post Note  Patient: [name]  Procedure(s) Performed: BREAST LUMPECTOMY WITH NEEDLE LOCALIZATION (Left: Breast) AXILLARY SENTINEL NODE BIOPSY (Left: Breast)  Patient location during evaluation: PACU Anesthesia Type: General Level of consciousness: awake and alert, oriented and patient cooperative Pain management: pain level controlled Vital Signs Assessment: post-procedure vital signs reviewed and stable Respiratory status: spontaneous breathing, nonlabored ventilation and respiratory function stable Cardiovascular status: blood pressure returned to baseline and stable Postop Assessment: adequate PO intake Anesthetic complications: no   No notable events documented.   Last Vitals:  Vitals:   09/25/21 1445 09/25/21 1458  BP: 124/61 136/83  Pulse: 70 69  Resp: 14 16  Temp: 36.7 C 36.7 C  SpO2: 96% 97%    Last Pain:  Vitals:   09/25/21 1458  TempSrc: Temporal  PainSc:                  Darrin Nipper

## 2021-09-30 ENCOUNTER — Other Ambulatory Visit: Payer: Self-pay | Admitting: Pathology

## 2021-09-30 LAB — SURGICAL PATHOLOGY

## 2021-10-02 ENCOUNTER — Encounter: Payer: Self-pay | Admitting: Oncology

## 2021-10-02 ENCOUNTER — Inpatient Hospital Stay (HOSPITAL_BASED_OUTPATIENT_CLINIC_OR_DEPARTMENT_OTHER): Payer: Medicare Other | Admitting: Oncology

## 2021-10-02 ENCOUNTER — Ambulatory Visit
Admission: RE | Admit: 2021-10-02 | Discharge: 2021-10-02 | Disposition: A | Discharge status: Home / Self Care | Payer: Medicare Other | Source: Ambulatory Visit | Attending: Radiation Oncology | Admitting: Radiation Oncology

## 2021-10-02 VITALS — BP 124/50 | HR 66 | Temp 98.1°F | Resp 18 | Wt 154.4 lb

## 2021-10-02 DIAGNOSIS — M1990 Unspecified osteoarthritis, unspecified site: Secondary | ICD-10-CM | POA: Insufficient documentation

## 2021-10-02 DIAGNOSIS — M810 Age-related osteoporosis without current pathological fracture: Secondary | ICD-10-CM | POA: Diagnosis not present

## 2021-10-02 DIAGNOSIS — Z79890 Hormone replacement therapy (postmenopausal): Secondary | ICD-10-CM | POA: Insufficient documentation

## 2021-10-02 DIAGNOSIS — Z7189 Other specified counseling: Secondary | ICD-10-CM

## 2021-10-02 DIAGNOSIS — E559 Vitamin D deficiency, unspecified: Secondary | ICD-10-CM | POA: Insufficient documentation

## 2021-10-02 DIAGNOSIS — E039 Hypothyroidism, unspecified: Secondary | ICD-10-CM | POA: Diagnosis not present

## 2021-10-02 DIAGNOSIS — C50112 Malignant neoplasm of central portion of left female breast: Secondary | ICD-10-CM

## 2021-10-02 DIAGNOSIS — Z809 Family history of malignant neoplasm, unspecified: Secondary | ICD-10-CM | POA: Diagnosis not present

## 2021-10-02 DIAGNOSIS — Z8542 Personal history of malignant neoplasm of other parts of uterus: Secondary | ICD-10-CM | POA: Insufficient documentation

## 2021-10-02 DIAGNOSIS — Z79899 Other long term (current) drug therapy: Secondary | ICD-10-CM | POA: Insufficient documentation

## 2021-10-02 DIAGNOSIS — Z170 Estrogen receptor positive status [ER+]: Secondary | ICD-10-CM | POA: Diagnosis not present

## 2021-10-02 DIAGNOSIS — E785 Hyperlipidemia, unspecified: Secondary | ICD-10-CM | POA: Diagnosis not present

## 2021-10-02 DIAGNOSIS — C50912 Malignant neoplasm of unspecified site of left female breast: Secondary | ICD-10-CM | POA: Diagnosis not present

## 2021-10-02 DIAGNOSIS — Z7902 Long term (current) use of antithrombotics/antiplatelets: Secondary | ICD-10-CM | POA: Diagnosis not present

## 2021-10-02 DIAGNOSIS — Z8673 Personal history of transient ischemic attack (TIA), and cerebral infarction without residual deficits: Secondary | ICD-10-CM | POA: Diagnosis not present

## 2021-10-02 DIAGNOSIS — Z8616 Personal history of COVID-19: Secondary | ICD-10-CM | POA: Diagnosis not present

## 2021-10-02 DIAGNOSIS — Z87891 Personal history of nicotine dependence: Secondary | ICD-10-CM | POA: Insufficient documentation

## 2021-10-02 MED ORDER — ALENDRONATE SODIUM 70 MG PO TABS: 70.0000 mg | ORAL_TABLET | ORAL | 5 refills | Status: DC

## 2021-10-02 NOTE — Consult Note (Signed)
NEW PATIENT EVALUATION  Name: Natalie [name]  MRN: [id number]  Date:   10/02/2021     DOB: 24-Sep-1950   This 71 y.o. 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  REFERRING PHYSICIAN: [name], MD  CHIEF COMPLAINT: No chief complaint on file.   DIAGNOSIS: The encounter diagnosis was Malignant neoplasm of left breast in female, estrogen receptor positive, unspecified site of breast ([place]).   PREVIOUS INVESTIGATIONS:  Mammogram and ultrasound reviewed Pathology reports reviewed Clinical notes reviewed  HPI: Patient is a 71 year old female presented with an abnormal mammogram of her left breast showing indeterminate left breast asymmetry with questionable possible distortion in the central left breast.  Ultrasound of axilla was normal.  She underwent targeted ultrasound biopsy showing invasive mammary carcinoma with tubular features.  She went on to have a wide local excision for a 5 mm grade 1 ER/PR positive invasive mammary carcinoma with margins clear at 4 mm.  There was DCIS present also margin for that was clear at 4 mm.  3 sentinel lymph nodes were examined all negative for metastatic disease.  She is somewhat swollen postoperatively although is doing well.  She is now referred to radiation oncology for consideration of treatment.  PLANNED TREATMENT REGIMEN: Hypofractionated whole breast radiation with breath-hold  PAST MEDICAL HISTORY:  has a past medical history of Acute bronchospasm due to viral infection (2020), Acute ischemic left MCA stroke ([place]) (04/19/2020), Allergy, Anemia, Angina at rest [facility]), Breast cancer, left ([place]) (08/21/2021), Cataract, Depressive disorder, Diverticulosis, Dyspnea, Epistaxis, Fatigue, Hearing loss, History of 2019 novel coronavirus disease (COVID-19) (12/18/2020), History of ischemic multifocal posterior circulation stroke  (02/28/2020), History of loop recorder, HLD (hyperlipidemia), Hypothyroidism, LAFB (left anterior fascicular block), Long term current use of antithrombotics/antiplatelets, Memory change, Migraine, Osteoarthritis, Osteoporosis, Palpitations, Premature menopause, QT prolongation, Seizures ([place]) (1994), Stage I adenocarcinoma of endometrium ([place]) (12/18/2020), and Vitamin D deficiency.    PAST SURGICAL HISTORY:  Past Surgical History:  Procedure Laterality Date   ABDOMINAL HYSTERECTOMY  12/18/2020   ABLATION  2006   AXILLARY SENTINEL NODE BIOPSY Left 09/25/2021   Procedure: AXILLARY SENTINEL NODE BIOPSY;  Surgeon: Robert Bellow, MD;  Location: [facility];  Service: General;  Laterality: Left;   BREAST BIOPSY Left 08/21/2021   Stereo Bx, X-clip; path --> IMC (G1, ER/PR +, Her2/neu -)   BREAST LUMPECTOMY WITH NEEDLE LOCALIZATION Left 09/25/2021   Procedure: BREAST LUMPECTOMY WITH NEEDLE LOCALIZATION;  Surgeon: Robert Bellow, MD;  Location: [facility];  Service: General;  Laterality: Left;   BUBBLE STUDY  04/23/2020   Procedure: BUBBLE STUDY;  Surgeon: Larey Dresser, MD;  Location: [facility];  Service: Cardiovascular;;   CATARACT EXTRACTION W/ INTRAOCULAR LENS IMPLANT Bilateral    COLONOSCOPY     COLONOSCOPY WITH PROPOFOL N/A 12/22/2018   Procedure: COLONOSCOPY WITH PROPOFOL;  Surgeon: Virgel Manifold, MD;  Location: [facility];  Service: Endoscopy;  Laterality: N/A;   EYE SURGERY  Spring 2018   Cataracts both eyes   HYSTEROSCOPY WITH D & C N/A 11/01/2020   Procedure: DILATATION AND CURETTAGE /HYSTEROSCOPY WITH MYOSURE;  Surgeon: Lafonda Mosses, MD;  Location: [facility];  Service: Gynecology;  Laterality: N/A;   LAPAROTOMY  12/18/2020   Procedure: LAPAROTOMY;  Surgeon: Lafonda Mosses, MD;  Location: [facility];  Service: Gynecology;;   LOOP RECORDER INSERTION N/A 04/23/2020   Procedure: LOOP RECORDER INSERTION;  Surgeon: Evans Lance, MD;  Location: [facility];   Service: Cardiovascular;  Laterality: N/A;   ROBOTIC ASSISTED TOTAL HYSTERECTOMY WITH BILATERAL SALPINGO OOPHERECTOMY Bilateral 12/18/2020   Procedure: XI ROBOTIC ASSISTED TOTAL HYSTERECTOMY WITH BILATERAL SALPINGO OOPHORECTOMY;  Surgeon: Lafonda Mosses, MD;  Location: [facility];  Service: Gynecology;  Laterality: Bilateral;   SENTINEL NODE BIOPSY N/A 12/18/2020   Procedure: SENTINEL NODE BIOPSY;  Surgeon: Lafonda Mosses, MD;  Location: [facility];  Service: Gynecology;  Laterality: N/A;   TEE WITHOUT CARDIOVERSION N/A 04/23/2020   Procedure: TRANSESOPHAGEAL ECHOCARDIOGRAM (TEE);  Surgeon: Larey Dresser, MD;  [facility];  Service: Cardiovascular;  Laterality: N/A;   TONSILLECTOMY      FAMILY HISTORY: family history includes Alzheimer's disease in her mother; Atrial fibrillation in her brother and brother; Cancer in her father; Dementia in her maternal aunt and maternal grandmother; Depression in her father; Diabetes in her father; Early death in her father; Hearing loss in her father; Heart disease in her brother, brother, maternal aunt, and maternal uncle.  SOCIAL HISTORY:  reports that she quit smoking about 47 years ago. Her smoking use included cigarettes. She has a 3.00 pack-year smoking history. She has never used smokeless tobacco. She reports that she does not currently use alcohol. She reports that she does not use drugs.  ALLERGIES: Sulfamethoxazole-trimethoprim  MEDICATIONS:  Current Outpatient Medications  Medication Sig Dispense Refill   aspirin 81 MG EC tablet Take by mouth.     atorvastatin (LIPITOR) 40 MG tablet Take 1 tablet (40 mg total) by mouth daily. 90 tablet 1   buPROPion (WELLBUTRIN SR) 150 MG 12 hr tablet Take 2 tablets (300 mg total) by mouth daily. 180 tablet 1   citalopram (CELEXA) 40 MG tablet TAKE 1 TABLET BY MOUTH EVERY DAY 90 tablet 1   clopidogrel (PLAVIX) 75 MG tablet Take 1 tablet (75 mg total) by mouth daily. 1 tablet 0   Cyanocobalamin  (VITAMIN B-12) 1000 MCG SUBL Place 1 tablet (1,000 mcg total) under the tongue 2 (two) times a week. 30 tablet 0   HYDROcodone-acetaminophen (NORCO/VICODIN) 5-325 MG tablet Take 1 tablet by mouth every 4 (four) hours as needed for moderate pain. 12 tablet 0   levothyroxine (SYNTHROID) 75 MCG tablet TAKE 1 TABLET(75 MCG) BY MOUTH DAILY BEFORE BREAKFAST 90 tablet 1   lidocaine-prilocaine (EMLA) cream Apply to areola one hour prior to arrival day of procedure. Cover with saran wrap.     Multiple Vitamin (MULTIVITAMIN WITH MINERALS) TABS tablet Take 1 tablet by mouth daily.     polyvinyl alcohol (LIQUIFILM TEARS) 1.4 % ophthalmic solution Place 1 drop into both eyes daily as needed for dry eyes.     Ubrogepant (UBRELVY) 100 MG TABS Take 100 mg by mouth daily as needed (migraines).     Vitamin D, Ergocalciferol, (DRISDOL) 1.25 MG (50000 UNIT) CAPS capsule Take 1 capsule (50,000 Units total) by mouth every 14 (fourteen) days. 6 capsule 1   No current facility-administered medications for this encounter.    ECOG PERFORMANCE STATUS:  0 - Asymptomatic  REVIEW OF SYSTEMS: Patient denies any weight loss, fatigue, weakness, fever, chills or night sweats. Patient denies any loss of vision, blurred vision. Patient denies any ringing  of the ears or hearing loss. No irregular heartbeat. Patient denies heart murmur or history of fainting. Patient denies any chest pain or pain radiating to her upper extremities. Patient denies any shortness of breath, difficulty breathing at night, cough or hemoptysis.  Patient denies any swelling in the lower legs. Patient denies any nausea vomiting, vomiting of blood, or coffee ground material in the vomitus. Patient denies any stomach pain. Patient states has had normal bowel movements no significant constipation or diarrhea. Patient denies any dysuria, hematuria or significant nocturia. Patient denies any problems walking, swelling in the joints or loss of balance. Patient denies  any skin changes, loss of hair or loss of weight. Patient denies any excessive worrying or anxiety or significant depression. Patient denies any problems with insomnia. Patient denies excessive thirst, polyuria, polydipsia. Patient denies any swollen glands, patient denies easy bruising or easy bleeding. Patient denies any recent infections, allergies or URI. Patient "s visual fields have not changed significantly in recent time.   PHYSICAL EXAM: There were no vitals taken for this visit. Left breast is status post wide local excision still with some seroma present.  There is also ecchymosis noted.  Her left axilla somewhat tender although both incisions appear to be healing well.  Right breast is free of dominant mass no axillary or supraclavicular adenopathy is identified.  Well-developed well-nourished patient in NAD. HEENT reveals PERLA, EOMI, discs not visualized.  Oral cavity is clear. No oral mucosal lesions are identified. Neck is clear without evidence of cervical or supraclavicular adenopathy. Lungs are clear to A&P. Cardiac examination is essentially unremarkable with regular rate and rhythm without murmur rub or thrill. Abdomen is benign with no organomegaly or masses noted. Motor sensory and DTR levels are equal and symmetric in the upper and lower extremities. Cranial nerves II through XII are grossly intact. Proprioception is intact. No peripheral adenopathy or edema is identified. No motor or sensory levels are noted. Crude visual fields are within normal range.  LABORATORY DATA: Pathology reports reviewed    RADIOLOGY RESULTS: Mammogram and ultrasound reviewed compatible with above-stated findings   IMPRESSION: Stage Ia tubular carcinoma of the left breast in 71 year old female status post wide local excision and sentinel node biopsy ER/PR positive.  PLAN: This time I recommended a hypofractionated course of radiation therapy to her left breast using breath-hold technique over 3 weeks.   Would also boost her scar based on 4 mm margin another 1000 cGy using electron beam.  Risks and benefits of treatment including skin reaction fatigue alteration of blood counts possible inclusion of superficial lung all were reviewed in detail with the patient.  I like a little more time for healing of set up simulation for the end of next week.  Patient comprehends my treatment plan well.  She also will benefit from endocrine therapy after completion of radiation.  I would like to take this opportunity to thank you for allowing me to participate in the care of your patient.Noreene Filbert, MD

## 2021-10-02 NOTE — Progress Notes (Signed)
   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  Diagnostic mammogram and ultrasound showed indeterminate left breast asymmetry as well as a probable benign right-sided breast mass.  Biopsy of both the masses was recommended.  However the second mass resolved at the time of biopsy likely consistent with fat necrosis from anticoagulate use.  Left breast mass biopsy showed invasive mammary carcinoma grade 1 ER greater than 90% positive, PR negative and HER2 negative.   Personal history of endometrial cancer in November 2022 s/p surgery and adjuvant radiation therapy.  Also had a posterior circulation stroke in January 2022 for which she is on dual antiplatelet therapy.   Final pathology showed a 5 mm tubular carcinoma grade 1 ER greater than 90% positive PR 10% positive and HER2 negative.  Negative margins.  3 sentinel lymph nodes negative for malignancy.  PT1AN0  Interval history-patient is doing well post lumpectomy.  She does have issues with memory to some extent but is otherwise independent of her ADLs and IADLs.  ECOG PS-  1 Pain scale- 0   Review of systems- Review of Systems  Constitutional:  Negative for chills, fever, malaise/fatigue and weight loss.  HENT:  Negative for congestion, ear discharge and nosebleeds.   Eyes:  Negative for blurred vision.  Respiratory:  Negative for cough, hemoptysis, sputum production, shortness of breath and wheezing.   Cardiovascular:  Negative for chest pain, palpitations, orthopnea and claudication.  Gastrointestinal:  Negative for abdominal pain, blood in stool, constipation, diarrhea, heartburn, melena, nausea and vomiting.  Genitourinary:  Negative for dysuria, flank pain, frequency, hematuria and urgency.  Musculoskeletal:  Negative for back pain, joint pain and myalgias.  Skin:  Negative for rash.  Neurological:  Negative for dizziness, tingling, focal weakness, seizures, weakness and headaches.  Endo/Heme/Allergies:  Does not bruise/bleed easily.  Psychiatric/Behavioral:  Negative for depression and suicidal ideas. The patient does not have insomnia.       Allergies  Allergen Reactions   Sulfamethoxazole-Trimethoprim Hives     Past Medical History:  Diagnosis Date   Acute bronchospasm due to viral infection 2020   Due to pollen   Acute ischemic left MCA stroke (HCC) 04/19/2020   a.) CTA head/neck 04/19/2020 --> nearly occlusive thrombus at bifurcation of a proximal M2 MCA branch approximately 6 mm from the MCA bifurcation. Subsequent occlusion of a proximal left M3 MCA branch   Allergy    Anemia    Angina at rest (HCC)    Breast cancer, left (HCC) 08/21/2021   a.) stereotactic Bx 08/21/2021 --> IMC (G1, ER/PR +, Her2/neu -)   Cataract    a.) s/p extraction with IOL placement   Depressive disorder    Diverticulosis    Dyspnea    Epistaxis    Fatigue      Hearing loss    History of 2019 novel coronavirus disease (COVID-19) 12/18/2020   History of ischemic multifocal posterior circulation stroke 02/28/2020   a.) presented to ED with increasing  episodes of  transient vertical binocular diplopia; MRI 02/27/2021 --> multiple punctate acute to subacute ischemic infarcts involving the cortical/subcortical aspects of both parieto-occipital regions   History of loop recorder    HLD (hyperlipidemia)    Hypothyroidism    LAFB (left anterior fascicular block)    Long term current use of antithrombotics/antiplatelets    a.) on DAPT therapy (ASA + clopidogrel)   Memory change    Migraine    Osteoarthritis    Osteoporosis    Palpitations    Premature menopause    QT prolongation    Seizures ([place]) 1994   history of mini seizures, possible migraine induced   Stage I adenocarcinoma of endometrium ([place]) 12/18/2020   a.) stage 1A; grade 1 (pT1a, pN0); b.) s/p TLH/BSO 12/18/2020 + adjuvant vaginal brachytherapy (30 Gy over 5 fractions between 02/11/2021 - 03/14/2021)   Vitamin D deficiency      Past Surgical History:  Procedure Laterality Date   ABDOMINAL HYSTERECTOMY  12/18/2020   ABLATION  2006   AXILLARY SENTINEL NODE BIOPSY Left 09/25/2021   Procedure: AXILLARY SENTINEL NODE BIOPSY;  Surgeon: Robert Bellow, MD;  Location: [facility] ORS;  Service: General;  Laterality: Left;   BREAST BIOPSY Left 08/21/2021   Stereo Bx, X-clip; path --> IMC (G1, ER/PR +, Her2/neu -)   BREAST LUMPECTOMY WITH NEEDLE LOCALIZATION Left 09/25/2021   Procedure: BREAST LUMPECTOMY WITH NEEDLE LOCALIZATION;  Surgeon: Robert Bellow, MD;  Location: [facility] ORS;  Service: General;  Laterality: Left;   BUBBLE STUDY  04/23/2020   Procedure: BUBBLE STUDY;  Surgeon: Larey Dresser, MD;  Location: [facility] ENDOSCOPY;  Service: Cardiovascular;;   CATARACT EXTRACTION W/ INTRAOCULAR LENS IMPLANT Bilateral    COLONOSCOPY     COLONOSCOPY WITH PROPOFOL N/A 12/22/2018   Procedure: COLONOSCOPY WITH PROPOFOL;  Surgeon: Virgel Manifold, MD;  Location: [facility] ENDOSCOPY;  Service: Endoscopy;  Laterality: N/A;   EYE SURGERY  Spring 2018   Cataracts both eyes   HYSTEROSCOPY WITH D  & C N/A 11/01/2020   Procedure: DILATATION AND CURETTAGE /HYSTEROSCOPY WITH MYOSURE;  Surgeon: Lafonda Mosses, MD;  Location: [facility] ORS;  Service: Gynecology;  Laterality: N/A;   LAPAROTOMY  12/18/2020   Procedure: LAPAROTOMY;  Surgeon: Lafonda Mosses, MD;  Location: [facility] ORS;  Service: Gynecology;;   LOOP RECORDER INSERTION N/A 04/23/2020   Procedure: LOOP RECORDER INSERTION;  Surgeon: Evans Lance, MD;  Location: [place] [facility];  Service: Cardiovascular;  Laterality: N/A;   ROBOTIC ASSISTED TOTAL HYSTERECTOMY WITH BILATERAL SALPINGO OOPHERECTOMY Bilateral 12/18/2020   Procedure: XI ROBOTIC ASSISTED TOTAL HYSTERECTOMY WITH BILATERAL SALPINGO OOPHORECTOMY;  Surgeon: Lafonda Mosses, MD;  Location: [facility];  Service: Gynecology;  Laterality: Bilateral;   SENTINEL NODE BIOPSY N/A 12/18/2020   Procedure: SENTINEL NODE BIOPSY;  Surgeon: Lafonda Mosses, MD;  Location: [facility];  Service: Gynecology;  Laterality: N/A;   TEE WITHOUT CARDIOVERSION N/A 04/23/2020   Procedure: TRANSESOPHAGEAL ECHOCARDIOGRAM (TEE);  Surgeon: Larey Dresser, MD;  Location: [facility] ENDOSCOPY;  Service: Cardiovascular;  Laterality: N/A;   TONSILLECTOMY      Social History   Socioeconomic History   Marital status: Single    Spouse name: Not on file   Number of children: 1   Years of education: Not on file   Highest  education level: Bachelor's degree (e.g., BA, AB, BS)  Occupational History   Occupation: retired   Tobacco Use   Smoking status: Former    Packs/day: 0.50    Years: 6.00    Total pack years: 3.00    Types: Cigarettes    Quit date: 02/11/1974    Years since quitting: 47.6   Smokeless tobacco: Never  Vaping Use   Vaping Use: Never used  Substance and Sexual Activity   Alcohol use: Not Currently   Drug use: Never   Sexual activity: Not Currently    Comment: Don't use not sexually active  Other Topics Concern   Not on file  Social History Narrative   Raised an adopted child on her  own   Working part time reviewed documents   Social Determinants of Health   Financial Resource Strain: Low Risk  (04/02/2021)   Overall Financial Resource Strain (CARDIA)    Difficulty of Paying Living Expenses: Not hard at all  Food Insecurity: No Food Insecurity (04/02/2021)   Hunger Vital Sign    Worried About Running Out of Food in the Last Year: Never true    [place] in the Last Year: Never true  Transportation Needs: No Transportation Needs (04/02/2021)   PRAPARE - Hydrologist (Medical): No    Lack of Transportation (Non-Medical): No  Physical Activity: Insufficiently Active (04/02/2021)   Exercise Vital Sign    Days of Exercise per Week: 2 days    Minutes of Exercise per Session: 30 min  Stress: No Stress Concern Present (04/02/2021)   [place]    Feeling of Stress : Not at all  Social Connections: Moderately Integrated (04/02/2021)   Social Connection and Isolation Panel [NHANES]    Frequency of Communication with Friends and Family: More than three times a week    Frequency of Social Gatherings with Friends and Family: Twice a week    Attends Religious Services: More than 4 times per year    Active Member of [employer] or Organizations: Yes    Attends Music therapist: More than 4 times per year    Marital Status: Never married  Intimate Partner Violence: Not At Risk (04/02/2021)   Humiliation, Afraid, Rape, and Kick questionnaire    Fear of Current or Ex-Partner: No    Emotionally Abused: No    Physically Abused: No    Sexually Abused: No    Family History  Problem Relation Age of Onset   Early death Father        suicide   Depression Father    Diabetes Father    Cancer Father        prostate   Hearing loss Father        due to war   Alzheimer's disease Mother    Heart disease Brother    Atrial fibrillation Brother    Heart disease Maternal Aunt     Dementia Maternal Aunt    Heart disease Maternal Uncle    Dementia Maternal Grandmother    Heart disease Brother    Atrial fibrillation Brother    Colon cancer Neg Hx    Breast cancer Neg Hx    Ovarian cancer Neg Hx    Pancreatic cancer Neg Hx    Endometrial cancer Neg Hx      Current Outpatient Medications:    aspirin 81 MG EC tablet, Take by mouth., Disp: , Rfl:  atorvastatin (LIPITOR) 40 MG tablet, Take 1 tablet (40 mg total) by mouth daily., Disp: 90 tablet, Rfl: 1   buPROPion (WELLBUTRIN SR) 150 MG 12 hr tablet, Take 2 tablets (300 mg total) by mouth daily., Disp: 180 tablet, Rfl: 1   citalopram (CELEXA) 40 MG tablet, TAKE 1 TABLET BY MOUTH EVERY DAY, Disp: 90 tablet, Rfl: 1   clopidogrel (PLAVIX) 75 MG tablet, Take 1 tablet (75 mg total) by mouth daily., Disp: 1 tablet, Rfl: 0   Cyanocobalamin (VITAMIN B-12) 1000 MCG SUBL, Place 1 tablet (1,000 mcg total) under the tongue 2 (two) times a week., Disp: 30 tablet, Rfl: 0   HYDROcodone-acetaminophen (NORCO/VICODIN) 5-325 MG tablet, Take 1 tablet by mouth every 4 (four) hours as needed for moderate pain., Disp: 12 tablet, Rfl: 0   levothyroxine (SYNTHROID) 75 MCG tablet, TAKE 1 TABLET(75 MCG) BY MOUTH DAILY BEFORE BREAKFAST, Disp: 90 tablet, Rfl: 1   lidocaine-prilocaine (EMLA) cream, Apply to areola one hour prior to arrival day of procedure. Cover with saran wrap., Disp: , Rfl:    Multiple Vitamin (MULTIVITAMIN WITH MINERALS) TABS tablet, Take 1 tablet by mouth daily., Disp: , Rfl:    polyvinyl alcohol (LIQUIFILM TEARS) 1.4 % ophthalmic solution, Place 1 drop into both eyes daily as needed for dry eyes., Disp: , Rfl:    Ubrogepant (UBRELVY) 100 MG TABS, Take 100 mg by mouth daily as needed (migraines)., Disp: , Rfl:    Vitamin D, Ergocalciferol, (DRISDOL) 1.25 MG (50000 UNIT) CAPS capsule, Take 1 capsule (50,000 Units total) by mouth every 14 (fourteen) days., Disp: 6 capsule, Rfl: 1  Physical exam:  Vitals:   10/02/21 0928  BP:  (!) 124/50  Pulse: 66  Resp: 18  Temp: 98.1 F (36.7 C)  SpO2: 100%  Weight: 154 lb 6.4 oz (70 kg)   Physical Exam Constitutional:      General: She is not in acute distress. Cardiovascular:     Rate and Rhythm: Normal rate and regular rhythm.     Heart sounds: Normal heart sounds.  Pulmonary:     Effort: Pulmonary effort is normal.     Breath sounds: Normal breath sounds.  Abdominal:     General: Bowel sounds are normal.     Palpations: Abdomen is soft.  Skin:    General: Skin is warm and dry.  Neurological:     Mental Status: She is alert and oriented to person, place, and time.         Latest Ref Rng & Units 03/13/2021   10:58 AM  CMP  Glucose 65 - 99 mg/dL 86   BUN 7 - 25 mg/dL 22   Creatinine 0.60 - 1.00 mg/dL 0.74   Sodium 135 - 146 mmol/L 139   Potassium 3.5 - 5.3 mmol/L 4.1   Chloride 98 - 110 mmol/L 105   CO2 20 - 32 mmol/L 29   Calcium 8.6 - 10.4 mg/dL 9.3   Total Protein 6.1 - 8.1 g/dL 6.4   Total Bilirubin 0.2 - 1.2 mg/dL 0.5   AST 10 - 35 U/L 18   ALT 6 - 29 U/L 17       Latest Ref Rng & Units 09/10/2021   11:54 AM  CBC  WBC 3.8 - 10.8 Thousand/uL 3.7   Hemoglobin 11.7 - 15.5 g/dL 12.3   Hematocrit 35.0 - 45.0 % 36.7   Platelets 140 - 400 Thousand/uL 174     No images are attached to the encounter.  MM Breast Surgical   Specimen  Result Date: 09/25/2021 CLINICAL DATA:  Assess surgical specimen following needle localization of a left breast lesion. EXAM: SPECIMEN RADIOGRAPH OF THE LEFT BREAST COMPARISON:  Previous exam(s). FINDINGS: Status post excision of the left breast. The wire tip and biopsy marker clip are present and are marked for pathology. X shaped marker clip lies closest to grade number C7. IMPRESSION: Specimen radiograph of the left breast. Electronically Signed   By: David  Ormond M.D.   On: 09/25/2021 13:38  NM Sentinel Node Inj-No Rpt (Breast)  Result Date: 09/25/2021 Sulfur Colloid was injected by the Nuclear Medicine Technologist  for sentinel lymph node localization.   MM LT PLC BREAST LOC DEV   1ST LESION  INC MAMMO GUIDE  Result Date: 09/25/2021 CLINICAL DATA:  Patient presents for mammographically guided needle localization of a left breast carcinoma prior to surgical excision. EXAM: NEEDLE LOCALIZATION OF THE LEFT BREAST WITH MAMMO GUIDANCE COMPARISON:  Previous exam(s). PROCEDURE: Patient presents for needle localization prior to surgical excision. I met with the patient and we discussed the procedure of needle localization including benefits and alternatives. We discussed the high likelihood of a successful procedure. We discussed the risks of the procedure, including infection, bleeding, tissue injury, and further surgery. Informed, written consent was given. The usual time-out protocol was performed immediately prior to the procedure. Using mammographic guidance, sterile technique, 1% lidocaine and a 5 modified Kopans needle, the X shaped biopsy clip associated small were localized using medial approach. The images were marked for Dr. Byrnett. IMPRESSION: Needle localization the left breast. No apparent complications. Electronically Signed   By: David  Ormond M.D.   On: 09/25/2021 09:36    Assessment and plan- Patient is a 71 y.o. female with stage Ia invasive mammary carcinoma/tubular carcinoma of the left breast pT1 aN0 M0 ER/PR positive HER2 negative here to discuss final pathology results and further management  Final pathology showed a 5 mm tubular carcinoma with negative margins.  3 sentinel lymph nodes negative for malignancy.  Tumor was more than 90% ER positive, PR weakly positive at 10% and HER2 negative.  Based on the size and grade patient does not require Oncotype testing and as such does not require adjuvant chemotherapy.  She is seeing radiation oncology today to discuss adjuvant radiation.  Given that she has ER positive tumor there would be role for endocrine therapy.  Her baseline bone density scan showed  osteoporosis and therefore I would ideally like to avoid aromatase inhibitors.  Tamoxifen would be a better choice for her.  Patient does have a prior history of stroke and is currently on aspirin and Plavix.  Tamoxifen can increase the risk of venous thromboembolism in about 5% cases and cerebrovascular accident in less than 1% of cases.  I will reach out to Dr. Shah her neurologist as well but given that patient is on aspirin and Plavix I think it would be okay for her to proceed with tamoxifen at this time  I will send her a prescription for tamoxifen after my conversation with Dr. Shah.  Given that she has baseline osteoporosis I do recommend either weekly Fosamax or yearly Reclast for her.  Discussed risks and benefits of Fosamax including all but not limited to possible reflux disease.  Risks of Reclast including all but not limited to hypocalcemia and osteonecrosis of the jaw.  Patient prefers to go with Fosamax and I will send a prescription for the same.  I will tentatively see her back in 4 months postradiation   and see how she is tolerating tamoxifen   Visit Diagnosis 1. Malignant neoplasm of central portion of left breast in female, estrogen receptor positive ([place])   2. Goals of care, counseling/discussion      Dr. Randa Evens, MD, MPH [facility] [phone] 10/02/2021 1:07 PM

## 2021-10-04 ENCOUNTER — Institutional Professional Consult (permissible substitution): Payer: Medicare Other | Admitting: Radiation Oncology

## 2021-10-08 DIAGNOSIS — C50912 Malignant neoplasm of unspecified site of left female breast: Secondary | ICD-10-CM | POA: Diagnosis not present

## 2021-10-08 DIAGNOSIS — Z170 Estrogen receptor positive status [ER+]: Secondary | ICD-10-CM | POA: Diagnosis not present

## 2021-10-10 ENCOUNTER — Ambulatory Visit
Admission: RE | Admit: 2021-10-10 | Discharge: 2021-10-10 | Disposition: A | Discharge status: Home / Self Care | Payer: Medicare Other | Source: Ambulatory Visit | Attending: Radiation Oncology | Admitting: Radiation Oncology

## 2021-10-10 DIAGNOSIS — Z510 Encounter for antineoplastic radiation therapy: Secondary | ICD-10-CM | POA: Insufficient documentation

## 2021-10-10 DIAGNOSIS — C50112 Malignant neoplasm of central portion of left female breast: Secondary | ICD-10-CM | POA: Diagnosis not present

## 2021-10-10 DIAGNOSIS — Z170 Estrogen receptor positive status [ER+]: Secondary | ICD-10-CM | POA: Diagnosis not present

## 2021-10-10 DIAGNOSIS — C50912 Malignant neoplasm of unspecified site of left female breast: Secondary | ICD-10-CM | POA: Diagnosis not present

## 2021-10-10 DIAGNOSIS — C541 Malignant neoplasm of endometrium: Secondary | ICD-10-CM | POA: Insufficient documentation

## 2021-10-11 ENCOUNTER — Other Ambulatory Visit: Payer: Self-pay | Admitting: *Deleted

## 2021-10-11 DIAGNOSIS — C50912 Malignant neoplasm of unspecified site of left female breast: Secondary | ICD-10-CM

## 2021-10-15 DIAGNOSIS — C50912 Malignant neoplasm of unspecified site of left female breast: Secondary | ICD-10-CM | POA: Diagnosis not present

## 2021-10-15 DIAGNOSIS — Z170 Estrogen receptor positive status [ER+]: Secondary | ICD-10-CM | POA: Diagnosis not present

## 2021-10-15 DIAGNOSIS — Z510 Encounter for antineoplastic radiation therapy: Secondary | ICD-10-CM | POA: Diagnosis not present

## 2021-10-15 DIAGNOSIS — C541 Malignant neoplasm of endometrium: Secondary | ICD-10-CM | POA: Diagnosis not present

## 2021-10-15 DIAGNOSIS — C50112 Malignant neoplasm of central portion of left female breast: Secondary | ICD-10-CM | POA: Diagnosis not present

## 2021-10-16 ENCOUNTER — Inpatient Hospital Stay: Payer: Medicare Other | Attending: Gynecologic Oncology | Admitting: Occupational Therapy

## 2021-10-16 DIAGNOSIS — C50112 Malignant neoplasm of central portion of left female breast: Secondary | ICD-10-CM | POA: Insufficient documentation

## 2021-10-16 DIAGNOSIS — L905 Scar conditions and fibrosis of skin: Secondary | ICD-10-CM

## 2021-10-16 NOTE — Therapy (Signed)
[facility] at [facility] [place], [place], [place], [address] Phone: [phone]   Fax:  [phone]  Occupational Therapy Screen:  Patient Details  Name: Natalie [name] MRN: [id number] Date of Birth: May 03, 1950 No data recorded  Encounter Date: 10/16/2021   OT End of Session - 10/16/21 2154     Visit Number 0             Past Medical History:  Diagnosis Date   Acute bronchospasm due to viral infection 2020   Due to pollen   Acute ischemic left MCA stroke ([place]) 04/19/2020   a.) CTA head/neck 04/19/2020 --> nearly occlusive thrombus at bifurcation of a proximal M2 MCA branch approximately 6 mm from the MCA bifurcation. Subsequent occlusion of a proximal left M3 MCA branch   Allergy    Anemia    Angina at rest [facility])    Breast cancer, left ([place]) 08/21/2021   a.) stereotactic Bx 08/21/2021 --> IMC (G1, ER/PR +, Her2/neu -)   Cataract    a.) s/p extraction with IOL placement   Depressive disorder    Diverticulosis    Dyspnea    Epistaxis    Fatigue    Hearing loss    History of 2019 novel coronavirus disease (COVID-19) 12/18/2020   History of ischemic multifocal posterior circulation stroke 02/28/2020   a.) presented to ED with increasing episodes of  transient vertical binocular diplopia; MRI 02/27/2021 --> multiple punctate acute to subacute ischemic infarcts involving the cortical/subcortical aspects of both parieto-occipital regions   History of loop recorder    HLD (hyperlipidemia)    Hypothyroidism    LAFB (left anterior fascicular block)    Long term current use of antithrombotics/antiplatelets    a.) on DAPT therapy (ASA + clopidogrel)   Memory change    Migraine    Osteoarthritis    Osteoporosis    Palpitations    Premature menopause    QT prolongation    Seizures ([place]) 1994   history of mini seizures, possible migraine induced   Stage I adenocarcinoma of endometrium ([place]) 12/18/2020   a.) stage  1A; grade 1 (pT1a, pN0); b.) s/p TLH/BSO 12/18/2020 + adjuvant vaginal brachytherapy (30 Gy over 5 fractions between 02/11/2021 - 03/14/2021)   Vitamin D deficiency     Past Surgical History:  Procedure Laterality Date   ABDOMINAL HYSTERECTOMY  12/18/2020   ABLATION  2006   AXILLARY SENTINEL NODE BIOPSY Left 09/25/2021   Procedure: AXILLARY SENTINEL NODE BIOPSY;  Surgeon: Robert Bellow, MD;  Location: [facility] ORS;  Service: General;  Laterality: Left;   BREAST BIOPSY Left 08/21/2021   Stereo Bx, X-clip; path --> IMC (G1, ER/PR +, Her2/neu -)   BREAST LUMPECTOMY WITH NEEDLE LOCALIZATION Left 09/25/2021   Procedure: BREAST LUMPECTOMY WITH NEEDLE LOCALIZATION;  Surgeon: Robert Bellow, MD;  Location: [facility] ORS;  Service: General;  Laterality: Left;   BUBBLE STUDY  04/23/2020   Procedure: BUBBLE STUDY;  Surgeon: Larey Dresser, MD;  Location: [facility] ENDOSCOPY;  Service: Cardiovascular;;   CATARACT EXTRACTION W/ INTRAOCULAR LENS IMPLANT Bilateral    COLONOSCOPY     COLONOSCOPY WITH PROPOFOL N/A 12/22/2018   Procedure: COLONOSCOPY WITH PROPOFOL;  Surgeon: Virgel Manifold, MD;  Location: [facility] ENDOSCOPY;  Service: Endoscopy;  Laterality: N/A;   EYE SURGERY  Spring 2018   Cataracts both eyes   HYSTEROSCOPY WITH D & C N/A 11/01/2020   Procedure: DILATATION AND CURETTAGE /HYSTEROSCOPY WITH MYOSURE;  Surgeon: Berline Lopes,  Corinna Lines, MD;  Location: [facility];  Service: Gynecology;  Laterality: N/A;   LAPAROTOMY  12/18/2020   Procedure: LAPAROTOMY;  Surgeon: Lafonda Mosses, MD;  Location: [facility];  Service: Gynecology;;   LOOP RECORDER INSERTION N/A 04/23/2020   Procedure: LOOP RECORDER INSERTION;  Surgeon: Evans Lance, MD;  Location: [place] CV LAB;  Service: Cardiovascular;  Laterality: N/A;   ROBOTIC ASSISTED TOTAL HYSTERECTOMY WITH BILATERAL SALPINGO OOPHERECTOMY Bilateral 12/18/2020   Procedure: XI ROBOTIC ASSISTED TOTAL HYSTERECTOMY WITH BILATERAL SALPINGO OOPHORECTOMY;  Surgeon:  Lafonda Mosses, MD;  Location: [facility];  Service: Gynecology;  Laterality: Bilateral;   SENTINEL NODE BIOPSY N/A 12/18/2020   Procedure: SENTINEL NODE BIOPSY;  Surgeon: Lafonda Mosses, MD;  [facility]: [facility];  Service: Gynecology;  Laterality: N/A;   TEE WITHOUT CARDIOVERSION N/A 04/23/2020   Procedure: TRANSESOPHAGEAL ECHOCARDIOGRAM (TEE);  Surgeon: Larey Dresser, MD;  Location: [facility] ENDOSCOPY;  Service: Cardiovascular;  Laterality: N/A;   TONSILLECTOMY      There were no vitals filed for this visit.   Subjective Assessment - 10/16/21 2152     Subjective  I am starting my radiation on Monday.  I have to say I stay tired.  I did have my lumpectomy.  And I told the doctor my left breast is now bigger than my other 1 and heart and does not look the same.  Wanted you to look at that and then look at my motion.    Currently in Pain? No/denies                 LYMPHEDEMA/ONCOLOGY QUESTIONNAIRE - 10/16/21 0001       Right Upper Extremity Lymphedema   15 cm Proximal to Olecranon Process 36.5 cm    10 cm Proximal to Olecranon Process 34.8 cm    Olecranon Process 25.5 cm      Left Upper Extremity Lymphedema   15 cm Proximal to Olecranon Process 37 cm    10 cm Proximal to Olecranon Process 35.5 cm    Olecranon Process 25 cm               OT SCREEN 09/18/21: Patient referred to OT by [facility] team meeting.  Patient to have 8/23 L lumpectomy by Dr. Bary Castilla. Patient report her left shoulder is a little bit lower and does not have full motion. Patient to walk once or twice a week for about a mile with her dog.  Patient is independent in ADLs as well as driving and home activities. Has 2 children lives close by in town. Patient was educated in active assisted range of motion for bilateral shoulder flexion and abduction on the wall after surgery and okay for patient to do overhead range of motion. Or patient can do in supine using a cane-active assisted range of motion for  shoulder flexion as well as abduction and external rotation.  10 reps pain-free She should feel a slight pull or stretch in the axilla but not in the shoulder. Patient can follow-up with me if needed as well as for possibly prior to radiation to reassess range of motion as well circumference of bilateral upper extremity.  Handout for lymphedema was provided for patient but patient's risk is low for lymphedema.  10/03/21 NOTE Dr Bary Castilla visit:    Circumareolar incision from the 10 to 2 o'clock position healing well. Correction of previous left ptosis. With fullness in the upper half of the left breast post mastopexy.  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 On 09/25/21  Plan:   Patient is encouraged to continue to wear support until breast heaviness resolves.  She has been started on Fosamax by Dr. Mayer Camel medical oncology for assistance with her osteoporosis. Investigation underway of whether she can tolerate tamoxifen in light of her previous stroke.  We will plan for follow-up examination in 1 month.  With the very favorable prognosis associated with tubular carcinomas, the small size and node-negative status, consideration could be given to omitting whole breast radiation.   OT SCREEN 10/17/22: Patient arrived for follow-up prior to radiation per patient request to review with patient range of motion exercises.  As well as assessing scar tissue in left breast and bilateral circumference measurements prior to radiation.    Patient do show some fibrotic changes and scar tissue on left breast was able to do some scar mobilization and soft tissue mobilization with great success.  Patient educated to continue until radiation.  Pain-free. Overhead shoulder range of motion is within functional limits and able to get into radiation position.  Patient can continue with range of motion overhead if needed. Did assess bilateral circumference and  upper extremity.  We will continue to assess during and after radiation but patient at low risk for lymphedema. Patient to follow-up with me next week.                               Visit Diagnosis: Scar condition and fibrosis of skin    Problem List Patient Active Problem List   Diagnosis Date Noted   Trigger finger, right ring finger 09/10/2021   Dupuytren's contracture of both hands 09/10/2021   Vitamin D deficiency 09/10/2021   Malignant neoplasm of left breast in female, estrogen receptor positive ([place]) 09/01/2021   Dyslipidemia 05/16/2021   Major depression, recurrent, chronic ([place]) 03/13/2021   Mild protein-calorie malnutrition ([place]) 03/13/2021   Endometrial cancer ([place]) 12/26/2020   Postoperative anemia due to acute blood loss 12/19/2020   History of CVA in adulthood 04/19/2020   History of ischemic multifocal posterior circulation stroke 02/29/2020   Difficulty hearing 06/28/2015   Arthritis, degenerative 06/28/2015   Paresthesia 06/28/2015   Purpura, nonthrombopenic ([place]) 06/28/2015   Adult hypothyroidism 08/09/2014   Migraine without aura and without status migrainosus, not intractable 09/19/2008   Moderate major depression ([place]) 09/07/2006   OP (osteoporosis) 09/07/2006    Rosalyn Gess, OTR/L,CLT 10/16/2021, 9:55 PM  [facility] [facility] at [facility] [address], [place], [place], [address] Phone: [phone]   Fax:  [phone]  Name: Natalie [name] MRN: [id number] Date of Birth: 10/28/1950

## 2021-10-17 ENCOUNTER — Ambulatory Visit: Payer: Medicare Other

## 2021-10-21 ENCOUNTER — Ambulatory Visit: Admission: RE | Admit: 2021-10-21 | Payer: Medicare Other | Source: Ambulatory Visit

## 2021-10-21 ENCOUNTER — Ambulatory Visit: Payer: Medicare Other

## 2021-10-21 DIAGNOSIS — Z170 Estrogen receptor positive status [ER+]: Secondary | ICD-10-CM | POA: Diagnosis not present

## 2021-10-21 DIAGNOSIS — Z510 Encounter for antineoplastic radiation therapy: Secondary | ICD-10-CM | POA: Diagnosis not present

## 2021-10-21 DIAGNOSIS — C541 Malignant neoplasm of endometrium: Secondary | ICD-10-CM | POA: Diagnosis not present

## 2021-10-21 DIAGNOSIS — C50912 Malignant neoplasm of unspecified site of left female breast: Secondary | ICD-10-CM | POA: Diagnosis not present

## 2021-10-21 DIAGNOSIS — C50112 Malignant neoplasm of central portion of left female breast: Secondary | ICD-10-CM | POA: Diagnosis not present

## 2021-10-22 ENCOUNTER — Ambulatory Visit
Admission: RE | Admit: 2021-10-22 | Discharge: 2021-10-22 | Disposition: A | Discharge status: Home / Self Care | Payer: Medicare Other | Source: Ambulatory Visit | Attending: Radiation Oncology | Admitting: Radiation Oncology

## 2021-10-22 ENCOUNTER — Other Ambulatory Visit: Payer: Self-pay

## 2021-10-22 DIAGNOSIS — C541 Malignant neoplasm of endometrium: Secondary | ICD-10-CM | POA: Diagnosis not present

## 2021-10-22 DIAGNOSIS — Z510 Encounter for antineoplastic radiation therapy: Secondary | ICD-10-CM | POA: Diagnosis not present

## 2021-10-22 DIAGNOSIS — C50112 Malignant neoplasm of central portion of left female breast: Secondary | ICD-10-CM | POA: Diagnosis not present

## 2021-10-22 LAB — RAD ONC ARIA SESSION SUMMARY
Course Elapsed Days: 0
Plan Fractions Treated to Date: 1
Plan Prescribed Dose Per Fraction: 2.66 Gy
Plan Total Fractions Prescribed: 16
Plan Total Prescribed Dose: 42.56 Gy
Reference Point Dosage Given to Date: 2.66 Gy
Reference Point Session Dosage Given: 2.66 Gy
Session Number: 1

## 2021-10-23 ENCOUNTER — Other Ambulatory Visit: Payer: Self-pay

## 2021-10-23 ENCOUNTER — Ambulatory Visit
Admission: RE | Admit: 2021-10-23 | Discharge: 2021-10-23 | Disposition: A | Discharge status: Home / Self Care | Payer: Medicare Other | Source: Ambulatory Visit | Attending: Radiation Oncology | Admitting: Radiation Oncology

## 2021-10-23 ENCOUNTER — Inpatient Hospital Stay: Payer: Medicare Other | Admitting: Occupational Therapy

## 2021-10-23 DIAGNOSIS — Z510 Encounter for antineoplastic radiation therapy: Secondary | ICD-10-CM | POA: Diagnosis not present

## 2021-10-23 DIAGNOSIS — C541 Malignant neoplasm of endometrium: Secondary | ICD-10-CM | POA: Diagnosis not present

## 2021-10-23 DIAGNOSIS — L905 Scar conditions and fibrosis of skin: Secondary | ICD-10-CM

## 2021-10-23 DIAGNOSIS — Z170 Estrogen receptor positive status [ER+]: Secondary | ICD-10-CM | POA: Diagnosis not present

## 2021-10-23 DIAGNOSIS — C50112 Malignant neoplasm of central portion of left female breast: Secondary | ICD-10-CM | POA: Diagnosis not present

## 2021-10-23 DIAGNOSIS — C50912 Malignant neoplasm of unspecified site of left female breast: Secondary | ICD-10-CM | POA: Diagnosis not present

## 2021-10-23 LAB — RAD ONC ARIA SESSION SUMMARY
Course Elapsed Days: 1
Plan Fractions Treated to Date: 2
Plan Prescribed Dose Per Fraction: 2.66 Gy
Plan Total Fractions Prescribed: 16
Plan Total Prescribed Dose: 42.56 Gy
Reference Point Dosage Given to Date: 5.32 Gy
Reference Point Session Dosage Given: 2.66 Gy
Session Number: 2

## 2021-10-23 NOTE — Therapy (Signed)
[facility] at [facility], [place], [place], [address] Phone: [phone]   Fax:  [phone]  Occupational Therapy Screen:  Patient Details  Name: Natalie [name] MRN: [id number] Date of Birth: 03-Dec-1950 No data recorded  Encounter Date: 10/23/2021   OT End of Session - 10/23/21 1107     Visit Number 0             Past Medical History:  Diagnosis Date   Acute bronchospasm due to viral infection 2020   Due to pollen   Acute ischemic left MCA stroke ([place]) 04/19/2020   a.) CTA head/neck 04/19/2020 --> nearly occlusive thrombus at bifurcation of a proximal M2 MCA branch approximately 6 mm from the MCA bifurcation. Subsequent occlusion of a proximal left M3 MCA branch   Allergy    Anemia    Angina at rest [facility])    Breast cancer, left ([place]) 08/21/2021   a.) stereotactic Bx 08/21/2021 --> IMC (G1, ER/PR +, Her2/neu -)   Cataract    a.) s/p extraction with IOL placement   Depressive disorder    Diverticulosis    Dyspnea    Epistaxis    Fatigue    Hearing loss    History of 2019 novel coronavirus disease (COVID-19) 12/18/2020   History of ischemic multifocal posterior circulation stroke 02/28/2020   a.) presented to ED with increasing episodes of  transient vertical binocular diplopia; MRI 02/27/2021 --> multiple punctate acute to subacute ischemic infarcts involving the cortical/subcortical aspects of both parieto-occipital regions   History of loop recorder    HLD (hyperlipidemia)    Hypothyroidism    LAFB (left anterior fascicular block)    Long term current use of antithrombotics/antiplatelets    a.) on DAPT therapy (ASA + clopidogrel)   Memory change    Migraine    Osteoarthritis    Osteoporosis    Palpitations    Premature menopause    QT prolongation    Seizures ([place]) 1994   history of mini seizures, possible migraine induced   Stage I adenocarcinoma of endometrium ([place]) 12/18/2020   a.) stage  1A; grade 1 (pT1a, pN0); b.) s/p TLH/BSO 12/18/2020 + adjuvant vaginal brachytherapy (30 Gy over 5 fractions between 02/11/2021 - 03/14/2021)   Vitamin D deficiency     Past Surgical History:  Procedure Laterality Date   ABDOMINAL HYSTERECTOMY  12/18/2020   ABLATION  2006   AXILLARY SENTINEL NODE BIOPSY Left 09/25/2021   Procedure: AXILLARY SENTINEL NODE BIOPSY;  Surgeon: Robert Bellow, MD;  Location: [facility] ORS;  Service: General;  Laterality: Left;   BREAST BIOPSY Left 08/21/2021   Stereo Bx, X-clip; path --> IMC (G1, ER/PR +, Her2/neu -)   BREAST LUMPECTOMY WITH NEEDLE LOCALIZATION Left 09/25/2021   Procedure: BREAST LUMPECTOMY WITH NEEDLE LOCALIZATION;  Surgeon: Robert Bellow, MD;  Location: [facility] ORS;  Service: General;  Laterality: Left;   BUBBLE STUDY  04/23/2020   Procedure: BUBBLE STUDY;  Surgeon: Larey Dresser, MD;  Location: [facility] ENDOSCOPY;  Service: Cardiovascular;;   CATARACT EXTRACTION W/ INTRAOCULAR LENS IMPLANT Bilateral    COLONOSCOPY     COLONOSCOPY WITH PROPOFOL N/A 12/22/2018   Procedure: COLONOSCOPY WITH PROPOFOL;  Surgeon: Virgel Manifold, MD;  Location: [facility] ENDOSCOPY;  Service: Endoscopy;  Laterality: N/A;   EYE SURGERY  Spring 2018   Cataracts both eyes   HYSTEROSCOPY WITH D & C N/A 11/01/2020   Procedure: DILATATION AND CURETTAGE /HYSTEROSCOPY WITH MYOSURE;  Surgeon: Berline Lopes,  Corinna Lines, MD;  [facility];  Service: Gynecology;  Laterality: N/A;   LAPAROTOMY  12/18/2020   Procedure: LAPAROTOMY;  Surgeon: Lafonda Mosses, MD;  [facility];  Service: Gynecology;;   LOOP RECORDER INSERTION N/A 04/23/2020   Procedure: LOOP RECORDER INSERTION;  Surgeon: Evans Lance, MD;  Location: [place] CV LAB;  Service: Cardiovascular;  Laterality: N/A;   ROBOTIC ASSISTED TOTAL HYSTERECTOMY WITH BILATERAL SALPINGO OOPHERECTOMY Bilateral 12/18/2020   Procedure: XI ROBOTIC ASSISTED TOTAL HYSTERECTOMY WITH BILATERAL SALPINGO OOPHORECTOMY;  Surgeon:  Lafonda Mosses, MD;  [facility];  Service: Gynecology;  Laterality: Bilateral;   SENTINEL NODE BIOPSY N/A 12/18/2020   Procedure: SENTINEL NODE BIOPSY;  Surgeon: Lafonda Mosses, MD;  [facility];  Service: Gynecology;  Laterality: N/A;   TEE WITHOUT CARDIOVERSION N/A 04/23/2020   Procedure: TRANSESOPHAGEAL ECHOCARDIOGRAM (TEE);  Surgeon: Larey Dresser, MD;  Location: [facility] ENDOSCOPY;  Service: Cardiovascular;  Laterality: N/A;   TONSILLECTOMY      There were no vitals filed for this visit.   Subjective Assessment - 10/23/21 1106     Subjective  I done some massage - scar tissue not pulling my nipple so in anymore- softer on side and under my nipple - had my first radiation yesterday    Currently in Pain? No/denies               OT SCREEN 09/18/21: Patient referred to OT by [facility] team meeting.  Patient to have 8/23 L lumpectomy by Dr. Bary Castilla. Patient report her left shoulder is a little bit lower and does not have full motion. Patient to walk once or twice a week for about a mile with her dog.  Patient is independent in ADLs as well as driving and home activities. Has 2 children lives close by in town. Patient was educated in active assisted range of motion for bilateral shoulder flexion and abduction on the wall after surgery and okay for patient to do overhead range of motion. Or patient can do in supine using a cane-active assisted range of motion for shoulder flexion as well as abduction and external rotation.  10 reps pain-free She should feel a slight pull or stretch in the axilla but not in the shoulder. Patient can follow-up with me if needed as well as for possibly prior to radiation to reassess range of motion as well circumference of bilateral upper extremity.  Handout for lymphedema was provided for patient but patient's risk is low for lymphedema.   10/03/21 NOTE Dr Bary Castilla visit:     Circumareolar incision from the 10 to 2 o'clock position healing  well. Correction of previous left ptosis. With fullness in the upper half of the left breast post mastopexy.  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 On 09/25/21  Plan:   Patient is encouraged to continue to wear support until breast heaviness resolves.  She has been started on Fosamax by Dr. Mayer Camel medical oncology for assistance with her osteoporosis. Investigation underway of whether she can tolerate tamoxifen in light of her previous stroke.  We will plan for follow-up examination in 1 month.  With the very favorable prognosis associated with tubular carcinomas, the small size and node-negative status, consideration could be given to omitting whole breast radiation.     OT SCREEN 10/17/22: Patient arrived for follow-up prior to radiation per patient request to review with patient range of motion exercises.  As well  as assessing scar tissue in left breast and bilateral circumference measurements prior to radiation.     Patient do show some fibrotic changes and scar tissue on left breast was able to do some scar mobilization and soft tissue mobilization with great success.  Patient educated to continue until radiation.  Pain-free. Overhead shoulder range of motion is within functional limits and able to get into radiation position.  Patient can continue with range of motion overhead if needed. Did assess bilateral circumference and upper extremity.  We will continue to assess during and after radiation but patient at low risk for lymphedema. Patient to follow-up with me next week.  OT SCREEN 10/23/21:  Patient following up after initiating some scar and soft tissue massage to left breast last week postlumpectomy.  Patient responded good to massage last week and this date scar tissue not pulling nipple in as much.  Patient had 1 radiation session yesterday.  Did use mini massager for scar and fibrotic techniques today with great success.   Remind patient again that she can may be due for another few days soft tissue massage and scar massage but then has to hold off until after radiation and follow-up with me. We will fabricate patient a chip bag to wear inside a camisole or soft bra the next few days as she can tolerate it -to break up scar tissue and fibrosis but then hold off when skin gets too tender from radiation. Patient to follow-up with me again the week after she stopped radiation. Patient is active range of motion overhead within normal limits and no pain.                                            Visit Diagnosis: Scar condition and fibrosis of skin    Problem List Patient Active Problem List   Diagnosis Date Noted   Trigger finger, right ring finger 09/10/2021   Dupuytren's contracture of both hands 09/10/2021   Vitamin D deficiency 09/10/2021   Malignant neoplasm of left breast in female, estrogen receptor positive ([place]) 09/01/2021   Dyslipidemia 05/16/2021   Major depression, recurrent, chronic ([place]) 03/13/2021   Mild protein-calorie malnutrition ([place]) 03/13/2021   Endometrial cancer ([place]) 12/26/2020   Postoperative anemia due to acute blood loss 12/19/2020   History of CVA in adulthood 04/19/2020   History of ischemic multifocal posterior circulation stroke 02/29/2020   Difficulty hearing 06/28/2015   Arthritis, degenerative 06/28/2015   Paresthesia 06/28/2015   Purpura, nonthrombopenic ([place]) 06/28/2015   Adult hypothyroidism 08/09/2014   Migraine without aura and without status migrainosus, not intractable 09/19/2008   Moderate major depression ([place]) 09/07/2006   OP (osteoporosis) 09/07/2006    Rosalyn Gess, OTR/L,CLT 10/23/2021, 11:07 AM  [place] at [facility] [address], [place], [place], [address] Phone: [phone]   Fax:  [phone]  Name: Natalie [name] MRN: [id number] Date of Birth:  [birth year]

## 2021-10-24 ENCOUNTER — Other Ambulatory Visit: Payer: Self-pay

## 2021-10-24 ENCOUNTER — Ambulatory Visit
Admission: RE | Admit: 2021-10-24 | Discharge: 2021-10-24 | Disposition: A | Discharge status: Home / Self Care | Payer: Medicare Other | Source: Ambulatory Visit | Attending: Radiation Oncology | Admitting: Radiation Oncology

## 2021-10-24 DIAGNOSIS — C50112 Malignant neoplasm of central portion of left female breast: Secondary | ICD-10-CM | POA: Diagnosis not present

## 2021-10-24 DIAGNOSIS — C50912 Malignant neoplasm of unspecified site of left female breast: Secondary | ICD-10-CM | POA: Diagnosis not present

## 2021-10-24 DIAGNOSIS — Z510 Encounter for antineoplastic radiation therapy: Secondary | ICD-10-CM | POA: Diagnosis not present

## 2021-10-24 DIAGNOSIS — C541 Malignant neoplasm of endometrium: Secondary | ICD-10-CM | POA: Diagnosis not present

## 2021-10-24 DIAGNOSIS — Z170 Estrogen receptor positive status [ER+]: Secondary | ICD-10-CM | POA: Diagnosis not present

## 2021-10-24 LAB — RAD ONC ARIA SESSION SUMMARY
Course Elapsed Days: 2
Plan Fractions Treated to Date: 3
Plan Prescribed Dose Per Fraction: 2.66 Gy
Plan Total Fractions Prescribed: 16
Plan Total Prescribed Dose: 42.56 Gy
Reference Point Dosage Given to Date: 7.98 Gy
Reference Point Session Dosage Given: 2.66 Gy
Session Number: 3

## 2021-10-25 ENCOUNTER — Other Ambulatory Visit: Payer: Self-pay

## 2021-10-25 ENCOUNTER — Ambulatory Visit
Admission: RE | Admit: 2021-10-25 | Discharge: 2021-10-25 | Disposition: A | Discharge status: Home / Self Care | Payer: Medicare Other | Source: Ambulatory Visit | Attending: Radiation Oncology | Admitting: Radiation Oncology

## 2021-10-25 DIAGNOSIS — C50112 Malignant neoplasm of central portion of left female breast: Secondary | ICD-10-CM | POA: Diagnosis not present

## 2021-10-25 DIAGNOSIS — Z510 Encounter for antineoplastic radiation therapy: Secondary | ICD-10-CM | POA: Diagnosis not present

## 2021-10-25 DIAGNOSIS — C50912 Malignant neoplasm of unspecified site of left female breast: Secondary | ICD-10-CM | POA: Diagnosis not present

## 2021-10-25 DIAGNOSIS — Z170 Estrogen receptor positive status [ER+]: Secondary | ICD-10-CM | POA: Diagnosis not present

## 2021-10-25 DIAGNOSIS — C541 Malignant neoplasm of endometrium: Secondary | ICD-10-CM | POA: Diagnosis not present

## 2021-10-25 LAB — RAD ONC ARIA SESSION SUMMARY
Course Elapsed Days: 3
Plan Fractions Treated to Date: 4
Plan Prescribed Dose Per Fraction: 2.66 Gy
Plan Total Fractions Prescribed: 16
Plan Total Prescribed Dose: 42.56 Gy
Reference Point Dosage Given to Date: 10.64 Gy
Reference Point Session Dosage Given: 2.66 Gy
Session Number: 4

## 2021-10-28 ENCOUNTER — Ambulatory Visit
Admission: RE | Admit: 2021-10-28 | Discharge: 2021-10-28 | Disposition: A | Discharge status: Home / Self Care | Payer: Medicare Other | Source: Ambulatory Visit | Attending: Radiation Oncology | Admitting: Radiation Oncology

## 2021-10-28 ENCOUNTER — Other Ambulatory Visit: Payer: Self-pay

## 2021-10-28 DIAGNOSIS — C50112 Malignant neoplasm of central portion of left female breast: Secondary | ICD-10-CM | POA: Diagnosis not present

## 2021-10-28 DIAGNOSIS — C50912 Malignant neoplasm of unspecified site of left female breast: Secondary | ICD-10-CM | POA: Diagnosis not present

## 2021-10-28 DIAGNOSIS — Z510 Encounter for antineoplastic radiation therapy: Secondary | ICD-10-CM | POA: Diagnosis not present

## 2021-10-28 DIAGNOSIS — Z170 Estrogen receptor positive status [ER+]: Secondary | ICD-10-CM | POA: Diagnosis not present

## 2021-10-28 DIAGNOSIS — C541 Malignant neoplasm of endometrium: Secondary | ICD-10-CM | POA: Diagnosis not present

## 2021-10-28 LAB — RAD ONC ARIA SESSION SUMMARY
Course Elapsed Days: 6
Plan Fractions Treated to Date: 5
Plan Prescribed Dose Per Fraction: 2.66 Gy
Plan Total Fractions Prescribed: 16
Plan Total Prescribed Dose: 42.56 Gy
Reference Point Dosage Given to Date: 13.3 Gy
Reference Point Session Dosage Given: 2.66 Gy
Session Number: 5

## 2021-10-29 ENCOUNTER — Other Ambulatory Visit: Payer: Self-pay

## 2021-10-29 ENCOUNTER — Ambulatory Visit
Admission: RE | Admit: 2021-10-29 | Discharge: 2021-10-29 | Disposition: A | Discharge status: Home / Self Care | Payer: Medicare Other | Source: Ambulatory Visit | Attending: Radiation Oncology | Admitting: Radiation Oncology

## 2021-10-29 DIAGNOSIS — C50112 Malignant neoplasm of central portion of left female breast: Secondary | ICD-10-CM | POA: Diagnosis not present

## 2021-10-29 DIAGNOSIS — C50912 Malignant neoplasm of unspecified site of left female breast: Secondary | ICD-10-CM | POA: Diagnosis not present

## 2021-10-29 DIAGNOSIS — C541 Malignant neoplasm of endometrium: Secondary | ICD-10-CM | POA: Diagnosis not present

## 2021-10-29 DIAGNOSIS — Z510 Encounter for antineoplastic radiation therapy: Secondary | ICD-10-CM | POA: Diagnosis not present

## 2021-10-29 DIAGNOSIS — Z170 Estrogen receptor positive status [ER+]: Secondary | ICD-10-CM | POA: Diagnosis not present

## 2021-10-29 LAB — RAD ONC ARIA SESSION SUMMARY
Course Elapsed Days: 7
Plan Fractions Treated to Date: 6
Plan Prescribed Dose Per Fraction: 2.66 Gy
Plan Total Fractions Prescribed: 16
Plan Total Prescribed Dose: 42.56 Gy
Reference Point Dosage Given to Date: 15.96 Gy
Reference Point Session Dosage Given: 2.66 Gy
Session Number: 6

## 2021-10-30 ENCOUNTER — Other Ambulatory Visit: Payer: Self-pay

## 2021-10-30 ENCOUNTER — Ambulatory Visit
Admission: RE | Admit: 2021-10-30 | Discharge: 2021-10-30 | Disposition: A | Discharge status: Home / Self Care | Payer: Medicare Other | Source: Ambulatory Visit | Attending: Radiation Oncology | Admitting: Radiation Oncology

## 2021-10-30 DIAGNOSIS — C541 Malignant neoplasm of endometrium: Secondary | ICD-10-CM | POA: Diagnosis not present

## 2021-10-30 DIAGNOSIS — C50112 Malignant neoplasm of central portion of left female breast: Secondary | ICD-10-CM | POA: Diagnosis not present

## 2021-10-30 DIAGNOSIS — C50912 Malignant neoplasm of unspecified site of left female breast: Secondary | ICD-10-CM | POA: Diagnosis not present

## 2021-10-30 DIAGNOSIS — Z510 Encounter for antineoplastic radiation therapy: Secondary | ICD-10-CM | POA: Diagnosis not present

## 2021-10-30 DIAGNOSIS — Z170 Estrogen receptor positive status [ER+]: Secondary | ICD-10-CM | POA: Diagnosis not present

## 2021-10-30 LAB — RAD ONC ARIA SESSION SUMMARY
Course Elapsed Days: 8
Plan Fractions Treated to Date: 7
Plan Prescribed Dose Per Fraction: 2.66 Gy
Plan Total Fractions Prescribed: 16
Plan Total Prescribed Dose: 42.56 Gy
Reference Point Dosage Given to Date: 18.62 Gy
Reference Point Session Dosage Given: 2.66 Gy
Session Number: 7

## 2021-10-31 ENCOUNTER — Inpatient Hospital Stay: Payer: Medicare Other

## 2021-10-31 ENCOUNTER — Other Ambulatory Visit: Payer: Self-pay

## 2021-10-31 ENCOUNTER — Ambulatory Visit
Admission: RE | Admit: 2021-10-31 | Discharge: 2021-10-31 | Disposition: A | Discharge status: Home / Self Care | Payer: Medicare Other | Source: Ambulatory Visit | Attending: Radiation Oncology | Admitting: Radiation Oncology

## 2021-10-31 ENCOUNTER — Encounter: Payer: Self-pay | Admitting: Family Medicine

## 2021-10-31 DIAGNOSIS — C50912 Malignant neoplasm of unspecified site of left female breast: Secondary | ICD-10-CM

## 2021-10-31 DIAGNOSIS — C50112 Malignant neoplasm of central portion of left female breast: Secondary | ICD-10-CM | POA: Diagnosis not present

## 2021-10-31 DIAGNOSIS — Z170 Estrogen receptor positive status [ER+]: Secondary | ICD-10-CM | POA: Diagnosis not present

## 2021-10-31 DIAGNOSIS — Z510 Encounter for antineoplastic radiation therapy: Secondary | ICD-10-CM | POA: Diagnosis not present

## 2021-10-31 DIAGNOSIS — C541 Malignant neoplasm of endometrium: Secondary | ICD-10-CM | POA: Diagnosis not present

## 2021-10-31 LAB — CBC
HCT: 37.3 % (ref 36.0–46.0)
Hemoglobin: 12.4 g/dL (ref 12.0–15.0)
MCH: 32.1 pg (ref 26.0–34.0)
MCHC: 33.2 g/dL (ref 30.0–36.0)
MCV: 96.6 fL (ref 80.0–100.0)
Platelets: 170 10*3/uL (ref 150–400)
RBC: 3.86 MIL/uL — ABNORMAL LOW (ref 3.87–5.11)
RDW: 12.3 % (ref 11.5–15.5)
WBC: 3.3 10*3/uL — ABNORMAL LOW (ref 4.0–10.5)
nRBC: 0 % (ref 0.0–0.2)

## 2021-10-31 LAB — RAD ONC ARIA SESSION SUMMARY
Course Elapsed Days: 9
Plan Fractions Treated to Date: 8
Plan Prescribed Dose Per Fraction: 2.66 Gy
Plan Total Fractions Prescribed: 16
Plan Total Prescribed Dose: 42.56 Gy
Reference Point Dosage Given to Date: 21.28 Gy
Reference Point Session Dosage Given: 0.3414 Gy
Session Number: 8

## 2021-11-01 ENCOUNTER — Ambulatory Visit (INDEPENDENT_AMBULATORY_CARE_PROVIDER_SITE_OTHER): Payer: Medicare Other

## 2021-11-01 ENCOUNTER — Other Ambulatory Visit: Payer: Self-pay

## 2021-11-01 ENCOUNTER — Ambulatory Visit
Admission: RE | Admit: 2021-11-01 | Discharge: 2021-11-01 | Disposition: A | Discharge status: Home / Self Care | Payer: Medicare Other | Source: Ambulatory Visit | Attending: Radiation Oncology | Admitting: Radiation Oncology

## 2021-11-01 DIAGNOSIS — C541 Malignant neoplasm of endometrium: Secondary | ICD-10-CM | POA: Diagnosis not present

## 2021-11-01 DIAGNOSIS — Z23 Encounter for immunization: Secondary | ICD-10-CM

## 2021-11-01 DIAGNOSIS — C50912 Malignant neoplasm of unspecified site of left female breast: Secondary | ICD-10-CM | POA: Diagnosis not present

## 2021-11-01 DIAGNOSIS — Z170 Estrogen receptor positive status [ER+]: Secondary | ICD-10-CM | POA: Diagnosis not present

## 2021-11-01 DIAGNOSIS — Z510 Encounter for antineoplastic radiation therapy: Secondary | ICD-10-CM | POA: Diagnosis not present

## 2021-11-01 DIAGNOSIS — C50112 Malignant neoplasm of central portion of left female breast: Secondary | ICD-10-CM | POA: Diagnosis not present

## 2021-11-01 LAB — RAD ONC ARIA SESSION SUMMARY
Course Elapsed Days: 10
Plan Fractions Treated to Date: 9
Plan Prescribed Dose Per Fraction: 2.66 Gy
Plan Total Fractions Prescribed: 16
Plan Total Prescribed Dose: 42.56 Gy
Reference Point Dosage Given to Date: 23.94 Gy
Reference Point Session Dosage Given: 2.66 Gy
Session Number: 9

## 2021-11-04 ENCOUNTER — Other Ambulatory Visit: Payer: Self-pay

## 2021-11-04 ENCOUNTER — Ambulatory Visit
Admission: RE | Admit: 2021-11-04 | Discharge: 2021-11-04 | Disposition: A | Discharge status: Home / Self Care | Payer: Medicare Other | Source: Ambulatory Visit | Attending: Radiation Oncology | Admitting: Radiation Oncology

## 2021-11-04 DIAGNOSIS — Z510 Encounter for antineoplastic radiation therapy: Secondary | ICD-10-CM | POA: Insufficient documentation

## 2021-11-04 DIAGNOSIS — Z170 Estrogen receptor positive status [ER+]: Secondary | ICD-10-CM | POA: Insufficient documentation

## 2021-11-04 DIAGNOSIS — C50912 Malignant neoplasm of unspecified site of left female breast: Secondary | ICD-10-CM | POA: Diagnosis not present

## 2021-11-04 DIAGNOSIS — Z23 Encounter for immunization: Secondary | ICD-10-CM | POA: Diagnosis not present

## 2021-11-04 LAB — RAD ONC ARIA SESSION SUMMARY
Course Elapsed Days: 13
Plan Fractions Treated to Date: 10
Plan Prescribed Dose Per Fraction: 2.66 Gy
Plan Total Fractions Prescribed: 16
Plan Total Prescribed Dose: 42.56 Gy
Reference Point Dosage Given to Date: 26.6 Gy
Reference Point Session Dosage Given: 2.66 Gy
Session Number: 10

## 2021-11-05 ENCOUNTER — Other Ambulatory Visit: Payer: Self-pay

## 2021-11-05 ENCOUNTER — Ambulatory Visit
Admission: RE | Admit: 2021-11-05 | Discharge: 2021-11-05 | Disposition: A | Discharge status: Home / Self Care | Payer: Medicare Other | Source: Ambulatory Visit | Attending: Radiation Oncology | Admitting: Radiation Oncology

## 2021-11-05 DIAGNOSIS — Z170 Estrogen receptor positive status [ER+]: Secondary | ICD-10-CM | POA: Diagnosis not present

## 2021-11-05 DIAGNOSIS — Z510 Encounter for antineoplastic radiation therapy: Secondary | ICD-10-CM | POA: Diagnosis not present

## 2021-11-05 DIAGNOSIS — C50912 Malignant neoplasm of unspecified site of left female breast: Secondary | ICD-10-CM | POA: Diagnosis not present

## 2021-11-05 LAB — RAD ONC ARIA SESSION SUMMARY
Course Elapsed Days: 14
Plan Fractions Treated to Date: 11
Plan Prescribed Dose Per Fraction: 2.66 Gy
Plan Total Fractions Prescribed: 16
Plan Total Prescribed Dose: 42.56 Gy
Reference Point Dosage Given to Date: 29.26 Gy
Reference Point Session Dosage Given: 2.66 Gy
Session Number: 11

## 2021-11-06 ENCOUNTER — Other Ambulatory Visit: Payer: Self-pay

## 2021-11-06 ENCOUNTER — Ambulatory Visit
Admission: RE | Admit: 2021-11-06 | Discharge: 2021-11-06 | Disposition: A | Discharge status: Home / Self Care | Payer: Medicare Other | Source: Ambulatory Visit | Attending: Radiation Oncology | Admitting: Radiation Oncology

## 2021-11-06 DIAGNOSIS — C50912 Malignant neoplasm of unspecified site of left female breast: Secondary | ICD-10-CM | POA: Diagnosis not present

## 2021-11-06 DIAGNOSIS — Z170 Estrogen receptor positive status [ER+]: Secondary | ICD-10-CM | POA: Diagnosis not present

## 2021-11-06 DIAGNOSIS — Z510 Encounter for antineoplastic radiation therapy: Secondary | ICD-10-CM | POA: Diagnosis not present

## 2021-11-06 LAB — RAD ONC ARIA SESSION SUMMARY
Course Elapsed Days: 15
Plan Fractions Treated to Date: 12
Plan Prescribed Dose Per Fraction: 2.66 Gy
Plan Total Fractions Prescribed: 16
Plan Total Prescribed Dose: 42.56 Gy
Reference Point Dosage Given to Date: 31.92 Gy
Reference Point Session Dosage Given: 2.66 Gy
Session Number: 12

## 2021-11-07 ENCOUNTER — Ambulatory Visit
Admission: RE | Admit: 2021-11-07 | Discharge: 2021-11-07 | Disposition: A | Discharge status: Home / Self Care | Payer: Medicare Other | Source: Ambulatory Visit | Attending: Radiation Oncology | Admitting: Radiation Oncology

## 2021-11-07 ENCOUNTER — Other Ambulatory Visit: Payer: Self-pay

## 2021-11-07 DIAGNOSIS — Z510 Encounter for antineoplastic radiation therapy: Secondary | ICD-10-CM | POA: Diagnosis not present

## 2021-11-07 DIAGNOSIS — C50912 Malignant neoplasm of unspecified site of left female breast: Secondary | ICD-10-CM | POA: Diagnosis not present

## 2021-11-07 DIAGNOSIS — Z170 Estrogen receptor positive status [ER+]: Secondary | ICD-10-CM | POA: Diagnosis not present

## 2021-11-07 LAB — RAD ONC ARIA SESSION SUMMARY
Course Elapsed Days: 16
Plan Fractions Treated to Date: 13
Plan Prescribed Dose Per Fraction: 2.66 Gy
Plan Total Fractions Prescribed: 16
Plan Total Prescribed Dose: 42.56 Gy
Reference Point Dosage Given to Date: 34.58 Gy
Reference Point Session Dosage Given: 2.66 Gy
Session Number: 13

## 2021-11-08 ENCOUNTER — Other Ambulatory Visit: Payer: Self-pay

## 2021-11-08 ENCOUNTER — Ambulatory Visit
Admission: RE | Admit: 2021-11-08 | Discharge: 2021-11-08 | Disposition: A | Discharge status: Home / Self Care | Payer: Medicare Other | Source: Ambulatory Visit | Attending: Radiation Oncology | Admitting: Radiation Oncology

## 2021-11-08 DIAGNOSIS — Z510 Encounter for antineoplastic radiation therapy: Secondary | ICD-10-CM | POA: Diagnosis not present

## 2021-11-08 DIAGNOSIS — Z170 Estrogen receptor positive status [ER+]: Secondary | ICD-10-CM | POA: Diagnosis not present

## 2021-11-08 DIAGNOSIS — C50912 Malignant neoplasm of unspecified site of left female breast: Secondary | ICD-10-CM | POA: Diagnosis not present

## 2021-11-08 LAB — RAD ONC ARIA SESSION SUMMARY
Course Elapsed Days: 17
Plan Fractions Treated to Date: 14
Plan Prescribed Dose Per Fraction: 2.66 Gy
Plan Total Fractions Prescribed: 16
Plan Total Prescribed Dose: 42.56 Gy
Reference Point Dosage Given to Date: 37.24 Gy
Reference Point Session Dosage Given: 2.66 Gy
Session Number: 14

## 2021-11-11 ENCOUNTER — Ambulatory Visit
Admission: RE | Admit: 2021-11-11 | Discharge: 2021-11-11 | Disposition: A | Discharge status: Home / Self Care | Payer: Medicare Other | Source: Ambulatory Visit | Attending: Radiation Oncology | Admitting: Radiation Oncology

## 2021-11-11 ENCOUNTER — Other Ambulatory Visit: Payer: Self-pay

## 2021-11-11 DIAGNOSIS — Z510 Encounter for antineoplastic radiation therapy: Secondary | ICD-10-CM | POA: Diagnosis not present

## 2021-11-11 DIAGNOSIS — C50912 Malignant neoplasm of unspecified site of left female breast: Secondary | ICD-10-CM | POA: Diagnosis not present

## 2021-11-11 DIAGNOSIS — Z170 Estrogen receptor positive status [ER+]: Secondary | ICD-10-CM | POA: Diagnosis not present

## 2021-11-11 LAB — RAD ONC ARIA SESSION SUMMARY
Course Elapsed Days: 20
Plan Fractions Treated to Date: 15
Plan Prescribed Dose Per Fraction: 2.66 Gy
Plan Total Fractions Prescribed: 16
Plan Total Prescribed Dose: 42.56 Gy
Reference Point Dosage Given to Date: 39.9 Gy
Reference Point Session Dosage Given: 2.66 Gy
Session Number: 15

## 2021-11-12 ENCOUNTER — Ambulatory Visit
Admission: RE | Admit: 2021-11-12 | Discharge: 2021-11-12 | Disposition: A | Discharge status: Home / Self Care | Payer: Medicare Other | Source: Ambulatory Visit | Attending: Radiation Oncology | Admitting: Radiation Oncology

## 2021-11-12 ENCOUNTER — Ambulatory Visit: Payer: Medicare Other

## 2021-11-12 ENCOUNTER — Other Ambulatory Visit: Payer: Self-pay

## 2021-11-12 DIAGNOSIS — Z170 Estrogen receptor positive status [ER+]: Secondary | ICD-10-CM | POA: Diagnosis not present

## 2021-11-12 DIAGNOSIS — Z510 Encounter for antineoplastic radiation therapy: Secondary | ICD-10-CM | POA: Diagnosis not present

## 2021-11-12 DIAGNOSIS — C50912 Malignant neoplasm of unspecified site of left female breast: Secondary | ICD-10-CM | POA: Diagnosis not present

## 2021-11-12 LAB — RAD ONC ARIA SESSION SUMMARY
Course Elapsed Days: 21
Plan Fractions Treated to Date: 16
Plan Prescribed Dose Per Fraction: 2.66 Gy
Plan Total Fractions Prescribed: 16
Plan Total Prescribed Dose: 42.56 Gy
Reference Point Dosage Given to Date: 42.56 Gy
Reference Point Session Dosage Given: 2.66 Gy
Session Number: 16

## 2021-11-13 ENCOUNTER — Other Ambulatory Visit: Payer: Self-pay

## 2021-11-13 ENCOUNTER — Ambulatory Visit
Admission: RE | Admit: 2021-11-13 | Discharge: 2021-11-13 | Disposition: A | Discharge status: Home / Self Care | Payer: Medicare Other | Source: Ambulatory Visit | Attending: Radiation Oncology | Admitting: Radiation Oncology

## 2021-11-13 ENCOUNTER — Ambulatory Visit: Payer: Medicare Other

## 2021-11-13 DIAGNOSIS — Z170 Estrogen receptor positive status [ER+]: Secondary | ICD-10-CM | POA: Diagnosis not present

## 2021-11-13 DIAGNOSIS — C50912 Malignant neoplasm of unspecified site of left female breast: Secondary | ICD-10-CM | POA: Diagnosis not present

## 2021-11-13 DIAGNOSIS — Z510 Encounter for antineoplastic radiation therapy: Secondary | ICD-10-CM | POA: Diagnosis not present

## 2021-11-13 LAB — RAD ONC ARIA SESSION SUMMARY
Course Elapsed Days: 22
Plan Fractions Treated to Date: 1
Plan Prescribed Dose Per Fraction: 2 Gy
Plan Total Fractions Prescribed: 5
Plan Total Prescribed Dose: 10 Gy
Reference Point Dosage Given to Date: 2 Gy
Reference Point Session Dosage Given: 2 Gy
Session Number: 17

## 2021-11-14 ENCOUNTER — Ambulatory Visit
Admission: RE | Admit: 2021-11-14 | Discharge: 2021-11-14 | Disposition: A | Discharge status: Home / Self Care | Payer: Medicare Other | Source: Ambulatory Visit | Attending: Radiation Oncology | Admitting: Radiation Oncology

## 2021-11-14 ENCOUNTER — Inpatient Hospital Stay: Payer: Medicare Other | Attending: Gynecologic Oncology

## 2021-11-14 ENCOUNTER — Other Ambulatory Visit: Payer: Self-pay

## 2021-11-14 DIAGNOSIS — Z170 Estrogen receptor positive status [ER+]: Secondary | ICD-10-CM | POA: Diagnosis not present

## 2021-11-14 DIAGNOSIS — Z510 Encounter for antineoplastic radiation therapy: Secondary | ICD-10-CM | POA: Diagnosis not present

## 2021-11-14 DIAGNOSIS — C50912 Malignant neoplasm of unspecified site of left female breast: Secondary | ICD-10-CM

## 2021-11-14 LAB — RAD ONC ARIA SESSION SUMMARY
Course Elapsed Days: 23
Plan Fractions Treated to Date: 2
Plan Prescribed Dose Per Fraction: 2 Gy
Plan Total Fractions Prescribed: 5
Plan Total Prescribed Dose: 10 Gy
Reference Point Dosage Given to Date: 4 Gy
Reference Point Session Dosage Given: 2 Gy
Session Number: 18

## 2021-11-14 LAB — CBC
HCT: 39.4 % (ref 36.0–46.0)
Hemoglobin: 13.3 g/dL (ref 12.0–15.0)
MCH: 32.4 pg (ref 26.0–34.0)
MCHC: 33.8 g/dL (ref 30.0–36.0)
MCV: 95.9 fL (ref 80.0–100.0)
Platelets: 177 10*3/uL (ref 150–400)
RBC: 4.11 MIL/uL (ref 3.87–5.11)
RDW: 12.1 % (ref 11.5–15.5)
WBC: 3.4 10*3/uL — ABNORMAL LOW (ref 4.0–10.5)
nRBC: 0 % (ref 0.0–0.2)

## 2021-11-15 ENCOUNTER — Other Ambulatory Visit: Payer: Self-pay

## 2021-11-15 ENCOUNTER — Ambulatory Visit
Admission: RE | Admit: 2021-11-15 | Discharge: 2021-11-15 | Disposition: A | Discharge status: Home / Self Care | Payer: Medicare Other | Source: Ambulatory Visit | Attending: Radiation Oncology | Admitting: Radiation Oncology

## 2021-11-15 DIAGNOSIS — C50912 Malignant neoplasm of unspecified site of left female breast: Secondary | ICD-10-CM | POA: Diagnosis not present

## 2021-11-15 DIAGNOSIS — Z170 Estrogen receptor positive status [ER+]: Secondary | ICD-10-CM | POA: Diagnosis not present

## 2021-11-15 DIAGNOSIS — Z510 Encounter for antineoplastic radiation therapy: Secondary | ICD-10-CM | POA: Diagnosis not present

## 2021-11-15 LAB — RAD ONC ARIA SESSION SUMMARY
Course Elapsed Days: 24
Plan Fractions Treated to Date: 3
Plan Prescribed Dose Per Fraction: 2 Gy
Plan Total Fractions Prescribed: 5
Plan Total Prescribed Dose: 10 Gy
Reference Point Dosage Given to Date: 6 Gy
Reference Point Session Dosage Given: 2 Gy
Session Number: 19

## 2021-11-18 ENCOUNTER — Other Ambulatory Visit: Payer: Self-pay

## 2021-11-18 ENCOUNTER — Ambulatory Visit
Admission: RE | Admit: 2021-11-18 | Discharge: 2021-11-18 | Disposition: A | Discharge status: Home / Self Care | Payer: Medicare Other | Source: Ambulatory Visit | Attending: Radiation Oncology | Admitting: Radiation Oncology

## 2021-11-18 ENCOUNTER — Ambulatory Visit: Payer: Medicare Other

## 2021-11-18 DIAGNOSIS — Z170 Estrogen receptor positive status [ER+]: Secondary | ICD-10-CM | POA: Diagnosis not present

## 2021-11-18 DIAGNOSIS — C50912 Malignant neoplasm of unspecified site of left female breast: Secondary | ICD-10-CM | POA: Diagnosis not present

## 2021-11-18 DIAGNOSIS — Z510 Encounter for antineoplastic radiation therapy: Secondary | ICD-10-CM | POA: Diagnosis not present

## 2021-11-18 LAB — RAD ONC ARIA SESSION SUMMARY
Course Elapsed Days: 27
Plan Fractions Treated to Date: 4
Plan Prescribed Dose Per Fraction: 2 Gy
Plan Total Fractions Prescribed: 5
Plan Total Prescribed Dose: 10 Gy
Reference Point Dosage Given to Date: 8 Gy
Reference Point Session Dosage Given: 2 Gy
Session Number: 20

## 2021-11-19 ENCOUNTER — Ambulatory Visit
Admission: RE | Admit: 2021-11-19 | Discharge: 2021-11-19 | Disposition: A | Discharge status: Home / Self Care | Payer: Medicare Other | Source: Ambulatory Visit | Attending: Radiation Oncology | Admitting: Radiation Oncology

## 2021-11-19 ENCOUNTER — Other Ambulatory Visit: Payer: Self-pay

## 2021-11-19 DIAGNOSIS — C50912 Malignant neoplasm of unspecified site of left female breast: Secondary | ICD-10-CM | POA: Diagnosis not present

## 2021-11-19 DIAGNOSIS — Z510 Encounter for antineoplastic radiation therapy: Secondary | ICD-10-CM | POA: Diagnosis not present

## 2021-11-19 DIAGNOSIS — Z170 Estrogen receptor positive status [ER+]: Secondary | ICD-10-CM | POA: Diagnosis not present

## 2021-11-19 LAB — RAD ONC ARIA SESSION SUMMARY
Course Elapsed Days: 28
Plan Fractions Treated to Date: 5
Plan Prescribed Dose Per Fraction: 2 Gy
Plan Total Fractions Prescribed: 5
Plan Total Prescribed Dose: 10 Gy
Reference Point Dosage Given to Date: 10 Gy
Reference Point Session Dosage Given: 2 Gy
Session Number: 21

## 2021-11-25 ENCOUNTER — Encounter: Payer: Self-pay | Admitting: *Deleted

## 2021-11-25 ENCOUNTER — Encounter: Payer: Self-pay | Admitting: Radiation Oncology

## 2021-11-26 DIAGNOSIS — M720 Palmar fascial fibromatosis [Dupuytren]: Secondary | ICD-10-CM | POA: Diagnosis not present

## 2021-11-26 DIAGNOSIS — M65341 Trigger finger, right ring finger: Secondary | ICD-10-CM | POA: Diagnosis not present

## 2021-11-27 ENCOUNTER — Inpatient Hospital Stay: Payer: Medicare Other | Admitting: Occupational Therapy

## 2021-11-27 DIAGNOSIS — L905 Scar conditions and fibrosis of skin: Secondary | ICD-10-CM

## 2021-11-27 NOTE — Therapy (Signed)
[facility] at [facility], [place], [place], [address] Phone: [phone]   Fax:  [phone]  Occupational Therapy Screen  Patient Details  Name: Natalie [name] MRN: [id number] Date of Birth: 25-Jul-1950 No data recorded  Encounter Date: 11/27/2021   OT End of Session - 11/27/21 1345     Visit Number 0             Past Medical History:  Diagnosis Date   Acute bronchospasm due to viral infection 2020   Due to pollen   Acute ischemic left MCA stroke ([place]) 04/19/2020   a.) CTA head/neck 04/19/2020 --> nearly occlusive thrombus at bifurcation of a proximal M2 MCA branch approximately 6 mm from the MCA bifurcation. Subsequent occlusion of a proximal left M3 MCA branch   Allergy    Anemia    Angina at rest    Breast cancer, left ([place]) 08/21/2021   a.) stereotactic Bx 08/21/2021 --> IMC (G1, ER/PR +, Her2/neu -)   Cataract    a.) s/p extraction with IOL placement   Depressive disorder    Diverticulosis    Dyspnea    Epistaxis    Fatigue    Hearing loss    History of 2019 novel coronavirus disease (COVID-19) 12/18/2020   History of ischemic multifocal posterior circulation stroke 02/28/2020   a.) presented to ED with increasing episodes of  transient vertical binocular diplopia; MRI 02/27/2021 --> multiple punctate acute to subacute ischemic infarcts involving the cortical/subcortical aspects of both parieto-occipital regions   History of loop recorder    History of radiation therapy    Vagina-02/11/21-03/14/21- Dr. Gery Pray   HLD (hyperlipidemia)    Hypothyroidism    LAFB (left anterior fascicular block)    Long term current use of antithrombotics/antiplatelets    a.) on DAPT therapy (ASA + clopidogrel)   Memory change    Migraine    Osteoarthritis    Osteoporosis    Palpitations    Premature menopause    QT prolongation    Seizures ([place]) 1994   history of mini seizures, possible migraine induced    Stage I adenocarcinoma of endometrium ([place]) 12/18/2020   a.) stage 1A; grade 1 (pT1a, pN0); b.) s/p TLH/BSO 12/18/2020 + adjuvant vaginal brachytherapy (30 Gy over 5 fractions between 02/11/2021 - 03/14/2021)   Vitamin D deficiency     Past Surgical History:  Procedure Laterality Date   ABDOMINAL HYSTERECTOMY  12/18/2020   ABLATION  2006   AXILLARY SENTINEL NODE BIOPSY Left 09/25/2021   Procedure: AXILLARY SENTINEL NODE BIOPSY;  Surgeon: Robert Bellow, MD;  Location: [facility];  Service: General;  Laterality: Left;   BREAST BIOPSY Left 08/21/2021   Stereo Bx, X-clip; path --> IMC (G1, ER/PR +, Her2/neu -)   BREAST LUMPECTOMY WITH NEEDLE LOCALIZATION Left 09/25/2021   Procedure: BREAST LUMPECTOMY WITH NEEDLE LOCALIZATION;  Surgeon: Robert Bellow, MD;  Location: [facility];  Service: General;  Laterality: Left;   BUBBLE STUDY  04/23/2020   Procedure: BUBBLE STUDY;  Surgeon: Larey Dresser, MD;  Location: [facility];  Service: Cardiovascular;;   CATARACT EXTRACTION W/ INTRAOCULAR LENS IMPLANT Bilateral    COLONOSCOPY     COLONOSCOPY WITH PROPOFOL N/A 12/22/2018   Procedure: COLONOSCOPY WITH PROPOFOL;  Surgeon: Virgel Manifold, MD;  Location: [facility];  Service: Endoscopy;  Laterality: N/A;   EYE SURGERY  Spring 2018   Cataracts both eyes   HYSTEROSCOPY WITH D & C N/A 11/01/2020  Procedure: DILATATION AND CURETTAGE /HYSTEROSCOPY WITH MYOSURE;  Surgeon: Lafonda Mosses, MD;  Location: [facility];  Service: Gynecology;  Laterality: N/A;   LAPAROTOMY  12/18/2020   Procedure: LAPAROTOMY;  Surgeon: Lafonda Mosses, MD;  Location: [facility];  Service: Gynecology;;   LOOP RECORDER INSERTION N/A 04/23/2020   Procedure: LOOP RECORDER INSERTION;  Surgeon: Evans Lance, MD;  Location: [facility];  Service: Cardiovascular;  Laterality: N/A;   ROBOTIC ASSISTED TOTAL HYSTERECTOMY WITH BILATERAL SALPINGO OOPHERECTOMY Bilateral 12/18/2020   Procedure: XI ROBOTIC ASSISTED  TOTAL HYSTERECTOMY WITH BILATERAL SALPINGO OOPHORECTOMY;  Surgeon: Lafonda Mosses, MD;  Location: [facility];  Service: Gynecology;  Laterality: Bilateral;   SENTINEL NODE BIOPSY N/A 12/18/2020   Procedure: SENTINEL NODE BIOPSY;  Surgeon: Lafonda Mosses, MD;  Location: [facility];  Service: Gynecology;  Laterality: N/A;   TEE WITHOUT CARDIOVERSION N/A 04/23/2020   Procedure: TRANSESOPHAGEAL ECHOCARDIOGRAM (TEE);  Surgeon: Larey Dresser, MD;  Location: [facility];  Service: Cardiovascular;  Laterality: N/A;   TONSILLECTOMY      There were no vitals filed for this visit.   Subjective Assessment - 11/27/21 1344     Subjective  I finished up radiation 11/19/2021.  I am doing really well with my motion.  I do feel actually good.  Still little hard area around my nipple but radiation was good-but I love that little chip bag that I wear that helps.    Currently in Pain? No/denies                 LYMPHEDEMA/ONCOLOGY QUESTIONNAIRE - 11/27/21 0001       Right Upper Extremity Lymphedema   15 cm Proximal to Olecranon Process 36 cm    10 cm Proximal to Olecranon Process 35 cm    Olecranon Process 25.6 cm      Left Upper Extremity Lymphedema   15 cm Proximal to Olecranon Process 36 cm    10 cm Proximal to Olecranon Process 34.5 cm    Olecranon Process 25.5 cm               OT SCREEN 09/18/21: Patient referred to OT by [facility] team meeting.  Patient to have 8/23 L lumpectomy by Dr. Bary Castilla. Patient report her left shoulder is a little bit lower and does not have full motion. Patient to walk once or twice a week for about a mile with her dog.  Patient is independent in ADLs as well as driving and home activities. Has 2 children lives close by in town. Patient was educated in active assisted range of motion for bilateral shoulder flexion and abduction on the wall after surgery and okay for patient to do overhead range of motion. Or patient can do in supine using a cane-active  assisted range of motion for shoulder flexion as well as abduction and external rotation.  10 reps pain-free She should feel a slight pull or stretch in the axilla but not in the shoulder. Patient can follow-up with me if needed as well as for possibly prior to radiation to reassess range of motion as well circumference of bilateral upper extremity.  Handout for lymphedema was provided for patient but patient's risk is low for lymphedema.   10/03/21 NOTE Dr Bary Castilla visit:     Circumareolar incision from the 10 to 2 o'clock position healing well. Correction of previous left ptosis. With fullness in the upper half of the left breast post mastopexy.  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 On 09/25/21  Plan:   Patient is encouraged to continue to wear support until breast heaviness resolves.  She has been started on Fosamax by Dr. Mayer Camel medical oncology for assistance with her osteoporosis. Investigation underway of whether she can tolerate tamoxifen in light of her previous stroke.  We will plan for follow-up examination in 1 month.  With the very favorable prognosis associated with tubular carcinomas, the small size and node-negative status, consideration could be given to omitting whole breast radiation.     OT SCREEN 10/17/22: Patient arrived for follow-up prior to radiation per patient request to review with patient range of motion exercises.  As well as assessing scar tissue in left breast and bilateral circumference measurements prior to radiation.     Patient do show some fibrotic changes and scar tissue on left breast was able to do some scar mobilization and soft tissue mobilization with great success.  Patient educated to continue until radiation.  Pain-free. Overhead shoulder range of motion is within functional limits and able to get into radiation position.  Patient can continue with range of motion overhead if needed. Did  assess bilateral circumference and upper extremity.  We will continue to assess during and after radiation but patient at low risk for lymphedema. Patient to follow-up with me next week.   OT SCREEN 10/23/21:   Patient following up after initiating some scar and soft tissue massage to left breast last week postlumpectomy.  Patient responded good to massage last week and this date scar tissue not pulling nipple in as much.  Patient had 1 radiation session yesterday.  Did use mini massager for scar and fibrotic techniques today with great success.  Remind patient again that she can may be due for another few days soft tissue massage and scar massage but then has to hold off until after radiation and follow-up with me. We will fabricate patient a chip bag to wear inside a camisole or soft bra the next few days as she can tolerate it -to break up scar tissue and fibrosis but then hold off when skin gets too tender from radiation. Patient to follow-up with me again the week after she stopped radiation. Patient is active range of motion overhead within normal limits and no pain.   OT SCREEN 11/27/21: Patient following up today after finishing radiation last week.  Patient's active range of motion in bilateral upper extremity within normal limits.  No pain or pull.  Patient to use a chip bag that she wore a few times on left breast to decrease fibrosis and scar tissue around the nipple.  With great success.  Patient still small area of fibrosis this date did mini massager and responded greatly. Recommended for patient to continue with the chip back and soft tissue massage may be for another 4 weeks until 12 weeks postop.  Circumference of bilateral upper extremity within normal limits compared to each other now.  Lymphedema risk very low. Patient to call me if needs follow-up.                                        Visit Diagnosis: Scar condition and fibrosis of  skin    Problem List Patient Active Problem List   Diagnosis Date Noted   Trigger finger, right ring finger 09/10/2021   Dupuytren's contracture of both hands  09/10/2021   Vitamin D deficiency 09/10/2021   Malignant neoplasm of left breast in female, estrogen receptor positive ([place]) 09/01/2021   Dyslipidemia 05/16/2021   Major depression, recurrent, chronic ([place]) 03/13/2021   Mild protein-calorie malnutrition ([place]) 03/13/2021   Endometrial cancer ([place]) 12/26/2020   Postoperative anemia due to acute blood loss 12/19/2020   History of CVA in adulthood 04/19/2020   History of ischemic multifocal posterior circulation stroke 02/29/2020   Difficulty hearing 06/28/2015   Arthritis, degenerative 06/28/2015   Paresthesia 06/28/2015   Purpura, nonthrombopenic ([place]) 06/28/2015   Adult hypothyroidism 08/09/2014   Migraine without aura and without status migrainosus, not intractable 09/19/2008   Moderate major depression ([place]) 09/07/2006   OP (osteoporosis) 09/07/2006    Rosalyn Gess, OTR/L,CLT 11/27/2021, 1:46 PM  [facility] [place] [place] at [facility] [address], [place], [place], [address] Phone: [phone]   Fax:  [phone]  Name: Natalie [name] MRN: [id number] Date of Birth: 09-18-1950

## 2021-11-27 NOTE — Progress Notes (Signed)
Radiation Oncology         ([phone]) [phone] ________________________________  Name: Natalie [name] MRN: [id number]  Date: 11/28/2021  DOB: April 22, 1950  Follow-Up Visit Note  CC: [name], MD  Lafonda Mosses, MD  No diagnosis found.  Diagnosis:   The encounter diagnosis was Endometrial cancer ([place]).   Stage 1A grade 1 endometrioid endometrial adenocarcinoma  Recently diagnosed Stage Ia (T1 aN0 M0) Left Breast invasive mammary carcinoma tubular type with low grade DCIS, ER/PR positive, Her2 negative, Grade 1, s/p lumpectomy and radiation therapy with Dr. Baruch Gouty   Interval Since Last Radiation: 15 days   Intent: Curative  Radiation treatment dates: 10/22/21 through 11/12/21 Site: Left breast Dose: 2.66 Gy/Fx delivered in 16 fractions for a total of 42.56 GY Energy: 10X-FFF/6X-FFF  Treatment Dates: 11/13/21  Site: Left Breast Boost to incision site  Energy: 9E Dose: 2 Gy/Fx delivered in 5 fractions for a total of 10 Gy  Radiation Treatment Dates: 02/11/2021 through 03/14/2021 Site Technique Total Dose (Gy) Dose per Fx (Gy) Completed Fx Beam Energies  Vagina: Pelvis HDR-brachy 30/30 6 5/5 Ir-192    Narrative:  The patient returns today for routine 6 month follow-up, she was last seen here for follow-up on 05/23/21. In the interval since her last visit, the patient was diagnosed with invasive mammary carcinoma of the left breast. Her recent history pertaining to her breast cancer diagnosis is detailed as follows.   Left breast Cancer:  The patient presented for a routine screening mammogram on 07/10/21 which showed a possible abnormality in the left breast. Bilateral diagnostic mammogram and bilateral breast ultrasound on 07/26/21 showed an indeterminate left breast asymmetry with a possible distortion in the central left breast. Prior to diagnostic imaging, the patient also reported a new right breast lump. Ultrasound showed a seemingly benign right breast mass  corresponding with the patient's new palpable lump.   Biopsy of the asymmetry in the upper inner left breast on 08/21/21 showed invasive mammary carcinoma with tubular features and DCIS. Prognostic indicators significant for: estrogen receptor 90% positive and progesterone receptor 10% positive, both with strong staining intensity ; Her2 status negative; Grade 1.   The patient opted to proceed with left breast lumpectomy with nodal biopsies on 09/25/21 under the care of Dr. Bary Castilla. Pathology from the procedure revealed grade 1 invasive tubular carcinoma with low-grade DCIS measuring 5 mm. All margins negative for both invasive and in-situ carcinoma. Nodal status of 3/3 left axillary sentinel lymph nodes negative for carcinoma. Prognostic indicators significant for: estrogen receptor 90% positive and progesterone receptor 10% positive, both with strong staining intensity ; Her2 status negative; Grade 1.   The patient was then referred to Dr. Baruch Gouty at [facility] and proceeded with radiation to the left breast. She tolerated treatment very well with mild skin reactions.   Endometrial Cancer:   During her most recent follow-up visit with Dr. Berline Lopes on 08/05/21, the patient denied any symptoms concerning for disease recurrence and was noted as NED on examination. She also reported using her vaginal dilator once a week.                                Allergies:  is allergic to sulfamethoxazole-trimethoprim.  Meds: Current Outpatient Medications  Medication Sig Dispense Refill   alendronate (FOSAMAX) 70 MG tablet Take 1 tablet (70 mg total) by mouth once a week. Take with a full glass of water on  an empty stomach. 4 tablet 5   aspirin 81 MG EC tablet Take by mouth.     atorvastatin (LIPITOR) 40 MG tablet Take 1 tablet (40 mg total) by mouth daily. 90 tablet 1   buPROPion (WELLBUTRIN SR) 150 MG 12 hr tablet Take 2 tablets (300 mg total) by mouth daily. 180 tablet 1   citalopram (CELEXA)  40 MG tablet TAKE 1 TABLET BY MOUTH EVERY DAY 90 tablet 1   clopidogrel (PLAVIX) 75 MG tablet Take 1 tablet (75 mg total) by mouth daily. 1 tablet 0   Cyanocobalamin (VITAMIN B-12) 1000 MCG SUBL Place 1 tablet (1,000 mcg total) under the tongue 2 (two) times a week. 30 tablet 0   HYDROcodone-acetaminophen (NORCO/VICODIN) 5-325 MG tablet Take 1 tablet by mouth every 4 (four) hours as needed for moderate pain. 12 tablet 0   levothyroxine (SYNTHROID) 75 MCG tablet TAKE 1 TABLET(75 MCG) BY MOUTH DAILY BEFORE BREAKFAST 90 tablet 1   lidocaine-prilocaine (EMLA) cream Apply to areola one hour prior to arrival day of procedure. Cover with saran wrap.     Multiple Vitamin (MULTIVITAMIN WITH MINERALS) TABS tablet Take 1 tablet by mouth daily.     polyvinyl alcohol (LIQUIFILM TEARS) 1.4 % ophthalmic solution Place 1 drop into both eyes daily as needed for dry eyes.     Ubrogepant (UBRELVY) 100 MG TABS Take 100 mg by mouth daily as needed (migraines).     Vitamin D, Ergocalciferol, (DRISDOL) 1.25 MG (50000 UNIT) CAPS capsule Take 1 capsule (50,000 Units total) by mouth every 14 (fourteen) days. 6 capsule 1   No current facility-administered medications for this encounter.    Physical Findings: The patient is in no acute distress. Patient is alert and oriented.  vitals were not taken for this visit. .  No significant changes. Lungs are clear to auscultation bilaterally. Heart has regular rate and rhythm. No palpable cervical, supraclavicular, or axillary adenopathy. Abdomen soft, non-tender, normal bowel sounds.  On pelvic examination the external genitalia were unremarkable. A speculum exam was performed. There are no mucosal lesions noted in the vaginal vault. A Pap smear was obtained of the proximal vagina. On bimanual and rectovaginal examination there were no pelvic masses appreciated. ***  Right Breast: no palpable mass, nipple discharge or bleeding. Left Breast: ***   Lab Findings: Lab Results   Component Value Date   WBC 3.4 (L) 11/14/2021   HGB 13.3 11/14/2021   HCT 39.4 11/14/2021   MCV 95.9 11/14/2021   PLT 177 11/14/2021    Radiographic Findings: No results found.  Impression:  Stage 1A grade 1 endometrioid endometrial adenocarcinoma  Recently diagnosed Stage Ia (T1 aN0 M0) Left Breast invasive mammary carcinoma tubular type with low grade DCIS, ER/PR positive, Her2 negative, Grade 1, s/p lumpectomy and radiation therapy with Dr. Baruch Gouty   The patient is recovering from the effects of radiation.  ***  Plan:  ***   *** minutes of total time was spent for this patient encounter, including preparation, face-to-face counseling with the patient and coordination of care, physical exam, and documentation of the encounter. ____________________________________  Blair Promise, PhD, MD  This document serves as a record of services personally performed by Gery Pray, MD. It was created on his behalf by Roney Mans, a trained medical scribe. The creation of this record is based on the scribe's personal observations and the provider's statements to them. This document has been checked and approved by the attending provider.

## 2021-11-28 ENCOUNTER — Ambulatory Visit
Admission: RE | Admit: 2021-11-28 | Discharge: 2021-11-28 | Disposition: A | Discharge status: Home / Self Care | Payer: Medicare Other | Source: Ambulatory Visit | Attending: Radiation Oncology | Admitting: Radiation Oncology

## 2021-11-28 ENCOUNTER — Inpatient Hospital Stay: Payer: Medicare Other

## 2021-11-28 VITALS — BP 128/69 | HR 66 | Temp 97.4°F | Resp 18 | Ht 65.0 in | Wt 151.5 lb

## 2021-11-28 DIAGNOSIS — Z170 Estrogen receptor positive status [ER+]: Secondary | ICD-10-CM | POA: Diagnosis not present

## 2021-11-28 DIAGNOSIS — C541 Malignant neoplasm of endometrium: Secondary | ICD-10-CM

## 2021-11-28 DIAGNOSIS — Z923 Personal history of irradiation: Secondary | ICD-10-CM | POA: Insufficient documentation

## 2021-11-28 DIAGNOSIS — Z8542 Personal history of malignant neoplasm of other parts of uterus: Secondary | ICD-10-CM | POA: Insufficient documentation

## 2021-11-28 DIAGNOSIS — C50912 Malignant neoplasm of unspecified site of left female breast: Secondary | ICD-10-CM | POA: Diagnosis not present

## 2021-11-28 NOTE — Progress Notes (Addendum)
Natalie [name] is here today for follow up post radiation to the pelvic.  They completed their radiation on: 03/24/21   Does the patient complain of any of the following:  Pain:no Abdominal bloating: no Diarrhea/Constipation: yes occasional constipation/diarrhea Nausea/Vomiting: no Vaginal Discharge: no Blood in Urine or Stool: no Urinary Issues (dysuria/incomplete emptying/ incontinence/ increased frequency/urgency): reports frequency due to drinking a lot fluids. Does patient report using vaginal dilator 2-3 times a week and/or sexually active 2-3 weeks: yes but not regularly. Reports she has an XS dialator that is too small and a large dilator that she is not able to use. Post radiation skin changes: no   Additional comments if applicable: She just completed radiation to her left breast at [facility]. She was also given a size XS+ vaginal dilator.  BP 128/69 (BP Location: Right Arm, Patient Position: Sitting)   Pulse 66   Temp (!) 97.4 F (36.3 C) (Temporal)   Resp 18   Ht '5\' 5"'$  (1.651 m)   Wt 151 lb 8 oz (68.7 kg)   SpO2 98%   BMI 25.21 kg/m

## 2021-11-29 ENCOUNTER — Encounter: Payer: Self-pay | Admitting: Gynecologic Oncology

## 2021-12-04 ENCOUNTER — Emergency Department: Payer: Medicare Other

## 2021-12-04 ENCOUNTER — Inpatient Hospital Stay
Admission: EM | Admit: 2021-12-04 | Discharge: 2021-12-06 | DRG: 103 | Disposition: A | Discharge status: Home / Self Care | Payer: Medicare Other | Attending: Internal Medicine | Admitting: Internal Medicine

## 2021-12-04 ENCOUNTER — Other Ambulatory Visit: Payer: Self-pay

## 2021-12-04 DIAGNOSIS — R29818 Other symptoms and signs involving the nervous system: Secondary | ICD-10-CM | POA: Diagnosis not present

## 2021-12-04 DIAGNOSIS — F331 Major depressive disorder, recurrent, moderate: Secondary | ICD-10-CM | POA: Diagnosis present

## 2021-12-04 DIAGNOSIS — Z853 Personal history of malignant neoplasm of breast: Secondary | ICD-10-CM | POA: Diagnosis not present

## 2021-12-04 DIAGNOSIS — Z87891 Personal history of nicotine dependence: Secondary | ICD-10-CM | POA: Diagnosis not present

## 2021-12-04 DIAGNOSIS — F339 Major depressive disorder, recurrent, unspecified: Secondary | ICD-10-CM | POA: Diagnosis present

## 2021-12-04 DIAGNOSIS — G43009 Migraine without aura, not intractable, without status migrainosus: Secondary | ICD-10-CM | POA: Diagnosis not present

## 2021-12-04 DIAGNOSIS — Z8673 Personal history of transient ischemic attack (TIA), and cerebral infarction without residual deficits: Secondary | ICD-10-CM | POA: Diagnosis not present

## 2021-12-04 DIAGNOSIS — R29702 NIHSS score 2: Secondary | ICD-10-CM | POA: Diagnosis present

## 2021-12-04 DIAGNOSIS — C541 Malignant neoplasm of endometrium: Secondary | ICD-10-CM | POA: Diagnosis present

## 2021-12-04 DIAGNOSIS — R569 Unspecified convulsions: Secondary | ICD-10-CM | POA: Diagnosis present

## 2021-12-04 DIAGNOSIS — Z8249 Family history of ischemic heart disease and other diseases of the circulatory system: Secondary | ICD-10-CM | POA: Diagnosis not present

## 2021-12-04 DIAGNOSIS — I6340 Cerebral infarction due to embolism of unspecified cerebral artery: Secondary | ICD-10-CM

## 2021-12-04 DIAGNOSIS — F449 Dissociative and conversion disorder, unspecified: Secondary | ICD-10-CM | POA: Diagnosis not present

## 2021-12-04 DIAGNOSIS — Z90710 Acquired absence of both cervix and uterus: Secondary | ICD-10-CM

## 2021-12-04 DIAGNOSIS — F321 Major depressive disorder, single episode, moderate: Secondary | ICD-10-CM | POA: Diagnosis present

## 2021-12-04 DIAGNOSIS — Z7983 Long term (current) use of bisphosphonates: Secondary | ICD-10-CM

## 2021-12-04 DIAGNOSIS — Z79890 Hormone replacement therapy (postmenopausal): Secondary | ICD-10-CM

## 2021-12-04 DIAGNOSIS — E785 Hyperlipidemia, unspecified: Secondary | ICD-10-CM | POA: Diagnosis not present

## 2021-12-04 DIAGNOSIS — Z7982 Long term (current) use of aspirin: Secondary | ICD-10-CM | POA: Diagnosis not present

## 2021-12-04 DIAGNOSIS — M1990 Unspecified osteoarthritis, unspecified site: Secondary | ICD-10-CM | POA: Diagnosis present

## 2021-12-04 DIAGNOSIS — Z820 Family history of epilepsy and other diseases of the nervous system: Secondary | ICD-10-CM

## 2021-12-04 DIAGNOSIS — Z170 Estrogen receptor positive status [ER+]: Secondary | ICD-10-CM | POA: Diagnosis not present

## 2021-12-04 DIAGNOSIS — Z8616 Personal history of COVID-19: Secondary | ICD-10-CM | POA: Diagnosis not present

## 2021-12-04 DIAGNOSIS — Z818 Family history of other mental and behavioral disorders: Secondary | ICD-10-CM | POA: Diagnosis not present

## 2021-12-04 DIAGNOSIS — Z961 Presence of intraocular lens: Secondary | ICD-10-CM | POA: Diagnosis not present

## 2021-12-04 DIAGNOSIS — Z7902 Long term (current) use of antithrombotics/antiplatelets: Secondary | ICD-10-CM

## 2021-12-04 DIAGNOSIS — C50912 Malignant neoplasm of unspecified site of left female breast: Secondary | ICD-10-CM

## 2021-12-04 DIAGNOSIS — Z822 Family history of deafness and hearing loss: Secondary | ICD-10-CM | POA: Diagnosis not present

## 2021-12-04 DIAGNOSIS — R002 Palpitations: Secondary | ICD-10-CM | POA: Diagnosis not present

## 2021-12-04 DIAGNOSIS — R2990 Unspecified symptoms and signs involving the nervous system: Secondary | ICD-10-CM | POA: Diagnosis not present

## 2021-12-04 DIAGNOSIS — Z923 Personal history of irradiation: Secondary | ICD-10-CM

## 2021-12-04 DIAGNOSIS — T783XXA Angioneurotic edema, initial encounter: Secondary | ICD-10-CM | POA: Diagnosis not present

## 2021-12-04 DIAGNOSIS — Z882 Allergy status to sulfonamides status: Secondary | ICD-10-CM

## 2021-12-04 DIAGNOSIS — I639 Cerebral infarction, unspecified: Secondary | ICD-10-CM | POA: Diagnosis not present

## 2021-12-04 DIAGNOSIS — Z79899 Other long term (current) drug therapy: Secondary | ICD-10-CM

## 2021-12-04 DIAGNOSIS — M810 Age-related osteoporosis without current pathological fracture: Secondary | ICD-10-CM | POA: Diagnosis not present

## 2021-12-04 DIAGNOSIS — E039 Hypothyroidism, unspecified: Secondary | ICD-10-CM | POA: Diagnosis present

## 2021-12-04 DIAGNOSIS — I209 Angina pectoris, unspecified: Secondary | ICD-10-CM | POA: Diagnosis not present

## 2021-12-04 DIAGNOSIS — Z9842 Cataract extraction status, left eye: Secondary | ICD-10-CM

## 2021-12-04 DIAGNOSIS — Z981 Arthrodesis status: Secondary | ICD-10-CM

## 2021-12-04 LAB — DIFFERENTIAL
Abs Immature Granulocytes: 0.01 10*3/uL (ref 0.00–0.07)
Basophils Absolute: 0 10*3/uL (ref 0.0–0.1)
Basophils Relative: 0 %
Eosinophils Absolute: 0.1 10*3/uL (ref 0.0–0.5)
Eosinophils Relative: 2 %
Immature Granulocytes: 0 %
Lymphocytes Relative: 25 %
Lymphs Abs: 1.3 10*3/uL (ref 0.7–4.0)
Monocytes Absolute: 0.4 10*3/uL (ref 0.1–1.0)
Monocytes Relative: 8 %
Neutro Abs: 3.3 10*3/uL (ref 1.7–7.7)
Neutrophils Relative %: 65 %

## 2021-12-04 LAB — CBC
HCT: 38.8 % (ref 36.0–46.0)
Hemoglobin: 12.8 g/dL (ref 12.0–15.0)
MCH: 31.6 pg (ref 26.0–34.0)
MCHC: 33 g/dL (ref 30.0–36.0)
MCV: 95.8 fL (ref 80.0–100.0)
Platelets: 180 10*3/uL (ref 150–400)
RBC: 4.05 MIL/uL (ref 3.87–5.11)
RDW: 12.4 % (ref 11.5–15.5)
WBC: 5.1 10*3/uL (ref 4.0–10.5)
nRBC: 0 % (ref 0.0–0.2)

## 2021-12-04 LAB — COMPREHENSIVE METABOLIC PANEL
ALT: 22 U/L (ref 0–44)
AST: 25 U/L (ref 15–41)
Albumin: 4 g/dL (ref 3.5–5.0)
Alkaline Phosphatase: 61 U/L (ref 38–126)
Anion gap: 9 (ref 5–15)
BUN: 18 mg/dL (ref 8–23)
CO2: 23 mmol/L (ref 22–32)
Calcium: 8.7 mg/dL — ABNORMAL LOW (ref 8.9–10.3)
Chloride: 102 mmol/L (ref 98–111)
Creatinine, Ser: 0.86 mg/dL (ref 0.44–1.00)
GFR, Estimated: >60 mL/min (ref >60)
Glucose, Bld: 86 mg/dL (ref 70–99)
Potassium: 3.7 mmol/L (ref 3.5–5.1)
Sodium: 134 mmol/L — ABNORMAL LOW (ref 135–145)
Total Bilirubin: 1 mg/dL (ref 0.3–1.2)
Total Protein: 6.8 g/dL (ref 6.5–8.1)

## 2021-12-04 LAB — GLUCOSE, CAPILLARY: Glucose-Capillary: 163 mg/dL — ABNORMAL HIGH (ref 70–99)

## 2021-12-04 LAB — PROTIME-INR
INR: 1 (ref 0.8–1.2)
Prothrombin Time: 13.5 seconds (ref 11.4–15.2)

## 2021-12-04 LAB — CBG MONITORING, ED: Glucose-Capillary: 97 mg/dL (ref 70–99)

## 2021-12-04 LAB — ETHANOL: Alcohol, Ethyl (B): <10 mg/dL (ref <10)

## 2021-12-04 LAB — APTT: aPTT: 27 seconds (ref 24–36)

## 2021-12-04 MED ORDER — PANTOPRAZOLE SODIUM 40 MG IV SOLR
40.0000 mg | Freq: Every day | INTRAVENOUS | Status: DC
  Administered 2021-12-05: 40 mg via INTRAVENOUS
  Filled 2021-12-04: qty 10

## 2021-12-04 MED ORDER — LABETALOL HCL 5 MG/ML IV SOLN: 10.0000 mg | INTRAVENOUS | Status: DC | PRN

## 2021-12-04 MED ORDER — ACETAMINOPHEN 650 MG RE SUPP: 650.0000 mg | RECTAL | Status: DC | PRN

## 2021-12-04 MED ORDER — FAMOTIDINE IN NACL 20-0.9 MG/50ML-% IV SOLN
20.0000 mg | Freq: Two times a day (BID) | INTRAVENOUS | Status: DC
  Administered 2021-12-05 (×2): 20 mg via INTRAVENOUS
  Filled 2021-12-04 (×4): qty 50

## 2021-12-04 MED ORDER — SODIUM CHLORIDE 0.9 % IV SOLN: INTRAVENOUS | Status: AC

## 2021-12-04 MED ORDER — DIPHENHYDRAMINE HCL 50 MG/ML IJ SOLN
50.0000 mg | Freq: Three times a day (TID) | INTRAMUSCULAR | Status: DC
  Administered 2021-12-05: 50 mg via INTRAVENOUS
  Filled 2021-12-04: qty 1

## 2021-12-04 MED ORDER — ACETAMINOPHEN 325 MG PO TABS
650.0000 mg | ORAL_TABLET | ORAL | Status: DC | PRN
  Administered 2021-12-06 (×2): 650 mg via ORAL
  Filled 2021-12-04 (×2): qty 2

## 2021-12-04 MED ORDER — DOCUSATE SODIUM 100 MG PO CAPS: 100.0000 mg | ORAL_CAPSULE | Freq: Two times a day (BID) | ORAL | Status: DC | PRN

## 2021-12-04 MED ORDER — TENECTEPLASE FOR STROKE
0.2500 mg/kg | PACK | Freq: Once | INTRAVENOUS | Status: AC
  Administered 2021-12-04: 17 mg via INTRAVENOUS

## 2021-12-04 MED ORDER — SODIUM CHLORIDE 0.9% FLUSH: 3.0000 mL | Freq: Once | INTRAVENOUS | Status: DC

## 2021-12-04 MED ORDER — POLYETHYLENE GLYCOL 3350 17 G PO PACK: 17.0000 g | PACK | Freq: Every day | ORAL | Status: DC | PRN

## 2021-12-04 MED ORDER — ACETAMINOPHEN 160 MG/5ML PO SOLN: 650.0000 mg | ORAL | Status: DC | PRN

## 2021-12-04 MED ORDER — METHYLPREDNISOLONE SODIUM SUCC 125 MG IJ SOLR
125.0000 mg | INTRAMUSCULAR | Status: AC
  Administered 2021-12-04: 125 mg via INTRAVENOUS
  Filled 2021-12-04: qty 2

## 2021-12-04 MED ORDER — DIPHENHYDRAMINE HCL 50 MG/ML IJ SOLN
50.0000 mg | INTRAMUSCULAR | Status: AC
  Administered 2021-12-04: 50 mg via INTRAVENOUS
  Filled 2021-12-04: qty 1

## 2021-12-04 MED ORDER — IOHEXOL 350 MG/ML SOLN
75.0000 mL | Freq: Once | INTRAVENOUS | Status: AC | PRN
  Administered 2021-12-04: 75 mL via INTRAVENOUS

## 2021-12-04 MED ORDER — FAMOTIDINE IN NACL 20-0.9 MG/50ML-% IV SOLN
20.0000 mg | INTRAVENOUS | Status: AC
  Administered 2021-12-04: 20 mg via INTRAVENOUS
  Filled 2021-12-04: qty 50

## 2021-12-04 MED ORDER — METHYLPREDNISOLONE SODIUM SUCC 40 MG IJ SOLR
40.0000 mg | Freq: Two times a day (BID) | INTRAMUSCULAR | Status: DC
  Administered 2021-12-05: 40 mg via INTRAVENOUS
  Filled 2021-12-04: qty 1

## 2021-12-04 MED ORDER — STROKE: EARLY STAGES OF RECOVERY BOOK: Freq: Once | Status: AC

## 2021-12-04 NOTE — ED Triage Notes (Addendum)
Pt sts that she has been really dizzy today. Pt sts that she has lost her balance today also. Pt sts that she had has previous strokes. RN did NIH exam which is negative. LKW was at 1800 today.

## 2021-12-04 NOTE — H&P (Signed)
NAME:  Natalie [name], MRN:  [id number], DOB:  [birth year], LOS: 0 ADMISSION DATE:  12/04/2021, CONSULTATION DATE:  12/04/21 REFERRING MD:  Dr. Leida Lauth, CHIEF COMPLAINT:  CODE STROKE   History of Present Illness:  71 yo F presenting to [facility] ED on 12/04/21 from home where she was in her normal state of health until 18:00. History provided by patient bedside with daughter's assistance. She reports sudden "dizziness" which she describes as feeling unsteady, drifting to one side, leading to an inability to walk. She denies falling, only because her daughter caught her in time. She reports some forgetfulness around following events but reports extreme fatigue, without syncope. She reports feeling as though she was breathing "heavy" and had some word-finding difficulty. She also states these symptoms felt similar to her previous TIA. She denies headache, blurred vision, nausea/vomiting, chest pain.  She has a history of TIA in 02/2020 followed by a stroke in 04/2020. She denies any significant residual symptoms after these events besides "dizzy spells". However, it is unclear if these "dizzy spells" were due to her stroke as they were occurring while she was being worked up for endometrial cancer and was profoundly anemic. She is post chemo and radiation treatment for endometrial cancer and recently completed radiation treatment on her Left breast. Both cancers were discovered at Stage I and there are no future plans for any additional treatment at this time. *** ED course: CODE STROKE initiated and neurology assessed the patient bedside, initial NIHSS: 2. She was determined to be a candidate for TNKase, which was administered at 19:24. She developed focal left upper lip swelling post TNKase. The patient and EDP felt this reaction was due to [facility] administration and NOT IV contrast administration, but per the [facility] these doses were given only minutes apart. She received anti-histamines & steroids with some  resolution of swelling- there was no intra-oral involvement at any point. Lab work unremarkable & Imaging negative for ICH, LVO or critical carotid stenosis.  Medications given: benadryl 50 mg, Pepcid IVPB, solumedrol 125 mg, TNKase, IV contrast Initial Vitals: 97.5, 20, 73, 155/79 & 100% on RA Significant labs: (Labs/ Imaging personally reviewed) I, Domingo Pulse [place], AGACNP-BC, personally viewed and interpreted this ECG. EKG Interpretation: Date: 12/04/21, EKG Time: 18:33, Rate: 69, Rhythm: NSR, QRS Axis:  LAD, Intervals: LAFB, ST/T Wave abnormalities: none, Narrative Interpretation: NSR with LAFB Chemistry: Na+: 134, K+: 3.7, BUN/Cr.: 18/0.86, Serum CO2/ AG: 23/9 Hematology: WBC: 5.1, Hgb: 12.8, plt: 180  CT head wo contrast 12/04/21: negative CTH, ASPECTS 10 CT angio head/neck w/wo contrast 12/04/21: Negative CTA of the head and neck. No LVO or emergent finding. No hemodynamically significant or correctable stenosis.  PCCM consulted for admission due to [place] post TNKase administration.  Pertinent  Medical History  Acute Ischemic LEFT MCA (04/2020) Ischemic Multifocal posterior circulation stroke Breast Cancer (left- 08/2021) Cataract Diverticulosis COVID-19 (11/22) HLD Hypothyroidism Seizures Adenocarcinoma of endometrium Stage I (11/22) Osteoarthritis  Significant Hospital Events: Including procedures, antibiotic start and stop dates in addition to other pertinent events   12/04/21: Admit to ICU for CODE STROKE post TNKase administration  Interim History / Subjective:  Patient alert and oriented with daughter bedside. No current complaints at this time. We discussed plan of care, all questions and concerns answered at this time.  Objective   Blood pressure (!) 154/83, pulse 76, temperature 97.6 F (36.4 C), resp. rate 16, weight 66.7 kg, SpO2 100 %.       No intake or output  data in the 24 hours ending 12/04/21 2032 Filed Weights   12/04/21 1829  Weight: 66.7 kg     Examination: General: Adult female, acutely ill, lying in bed, NAD HEENT: MM pink/moist, anicteric, atraumatic, neck supple Neurological Exam A&O x 4, able to follow commands, PERRL +3, MAE CN: II - Visual field intact OU III, IV, VI - Extraocular movements intact. V - Facial sensation intact bilaterally. VII - Facial movement intact bilaterally VIII - Hearing & vestibular intact bilaterally X - Palate elevates symmetrically XI - Chin turning & shoulder shrug intact bilaterally. XII - Tongue protrusion intact Motor Strength - strength 5/5 all extremities and pronator drift was absent.  Sensory - Light touch was assessed and patient describing mild difference on RIGHT side, describing that touch as "firm" / "heavier" Coordination - The patient had normal movements in the hands and feet with no ataxia or dysmetria.  Tremor was absent Gait and Station - deferred.  NIHSS Level of  Consciousness: 0 = Alert, keenly responsive; 1 = Not alert, but arousable by minor stimulation to obey, answer, or respond; 2 = Not alert, requires repeated stimulation to attend, or is obtunded and requires strong or painful stimulation to make movements (not stereotyped); 3 = Responds only with reflex motor or autonomic effects or totally unresponsive, flaccid, and areflexic   0  LOC Questions 0 = Answers both questions correctly; 1 = Answers one question correctly; 2 = Answers neither question correctly.  0  LOC Commands 0 = Performs both tasks correctly; 1 = Performs one task correctly; 2 = Performs neither task correctly  0  Best Gaze  0 = Normal; 1 = Partial gaze palsy; gaze is abnormal in one or both eyes, but forced deviation or total gaze paresis is not present; 2 = Forced deviation, or total gaze paresis not overcome by the oculocephalic maneuver   0  Visual 0 = No visual loss; 1 = Partial hemianopia; 2 = Complete hemianopia; 3 = Bilateral hemianopia (blind including cortical blindness).   0  Facial  Palsy 0 = Normal symmetrical movements; 1 = Minor paralysis (flattened nasolabial fold, asymmetry on smiling); 2 = Partial paralysis (total or near-total paralysis of lower face); 3 = Complete paralysis of one or both sides (absence of facial movement in the upper and lower face).  0  Motor Arm LEFT 0 = No drift; limb holds 90 (or 45) degrees for full 10 secs; 1 = Drift; limb holds 90 (or 45) degrees, but drifts down before full 10 seconds; does not hit bed or other support; 2 = Some effort against gravity; limb cannot get to or maintain (if cued) 90 (or 45) degrees, drifts down to bed, but has some effort against gravity; 3 = No effort against gravity; limb falls; 4 = No movement; UN = unable to test (Amputation or joint fusion).   0  Motor Arm RIGHT 0 = No drift; limb holds 90 (or 45) degrees for full 10 secs; 1 = Drift; limb holds 90 (or 45) degrees, but drifts down before full 10 seconds; does not hit bed or other support; 2 = Some effort against gravity; limb cannot get to or maintain (if cued) 90 (or 45) degrees, drifts down to bed, but has some effort against gravity; 3 = No effort against gravity; limb falls; 4 = No movement; UN = unable to test (Amputation or joint fusion).   0  Motor Leg LEFT 0 = No drift; leg holds 30 degree position for  full 5 secs; 1 = Drift; leg falls by the end of the 5-sec period but does not hit bed; 2 = Some effort against gravity; leg falls to bed by 5 secs, but has some effort against gravity; 3 = No effort against gravity; leg falls to bed immediately; 4 = No movement; UN = unable to test (Amputation or joint fusion).   0  Motor Leg RIGHT 0 = No drift; leg holds 30 degree position for full 5 secs; 1 = Drift; leg falls by the end of the 5-sec period but does not hit bed; 2 = Some effort against gravity; leg falls to bed by 5 secs, but has some effort against gravity; 3 = No effort against gravity; leg falls to bed immediately; 4 = No movement; UN = unable to test (Amputation  or joint fusion).   0  Limb Ataxia 0 = Absent; 1 = Present in one limb; 2 = Present in two limbs; UN = Amputation or joint fusion   0  Sensory  0 = Normal, no sensory loss; 1 = Mild-to-moderate sensory loss, patient feels pinprick is less sharp or is dull on the affected side, or there is a loss of superficial pain with pinprick, but patient is aware of being touched; 2 = Severe to total sensory loss, patient is not aware of being touched in the face, arm, and leg.   1  Best Language 0 = No aphasia; normal; 1 = Mild-to-moderate aphasia; some obvious loss of fluency or facility of comprehension, w/o significant limitation on ideas expressed or form of expression. Reduction of speech and/or comprehension, however, makes conversation about provided materials difficult or impossible. For example, in conversation about provided materials, examiner can identify picture or naming card content from patient's response; 2 = Severe aphasia; all communication is through fragmentary expression; great need for inference, questioning, and guessing by the listener. Range of information that can be exchanged is limited; listener carries burden of communication. Examiner cannot identify materials provided from patient response; 3 = Mute, global aphasia; no usable speech or auditory comprehension  0  Dysarthria 0 = Normal; 1 = Mild-to-moderate dysarthria, patient slurs at least some words and, at worst, can be understood with some difficulty; 2 = Severe dysarthria, patient's speech is so slurred as to be unintelligible in the absence of or out of proportion to any dysphasia, or is mute/anarthric; UN = Intubated or other physical barrier  0  Extinction/Inattention 0 = No abnormality 1 = Visual, tactile, auditory, spatial, or personal extinction/inattention to bilateral simultaneous stimulation in one of the sensory modalities. 2 = Profound hemi-inattention or extinction to more than one modality; does not recognize own hand or  orients to only one side of space.  0  Total   1   CV: s1s2 RRR, NSR on monitor, no r/m/g Pulm: Regular, non labored on RA , breath sounds clear-BUL & clear/diminished-BLL GI: soft, rounded, non tender, bs x 4 Skin: ecchymosis on right hip, small abrasion to Right 3rd toe Extremities: warm/dry, pulses + 2 R/P, no edema noted  Resolved Hospital Problem list     Assessment & Plan:  Suspected CVA s/p TNKase - Neuro checks Q 15 min x 2 h, Q 30 m x 6 h, Q 1 h x 16 h - NIHSS Q shift or any change in mentation - perform bedside swallow > SLP eval if needed - PT/OT consult - Neurology consulted, appreciate input - f/u MRI wo contrast - Echocardiogram ordered - f/u  Lipid panel & Hgb A1c  -SCD's for VTE prophylaxis - aspirin 81 mg daily and clopidogrel 75 mg daily prior to admission, plan to restart 24 hours post TNKase - labetalol PRN to maintain BP < 180/105 - neurology following, appreciate input  Mild Angioedema s/p TNKase administration - continue pepcid, benadryl & solu medrol  - Q 4 CBG monitoring - SSI - follow ICU hypo/hyper-glycemia protocol  Hypertension Permissive hypertension (OK if <220/120) for 24-48 hours post stroke and then gradually normalized within 5-7 days.  Hyperlipidemia LDL ***, goal < 70 - continue home regimen: ***  Best Practice (right click and "Reselect all SmartList Selections" daily)  Diet/type: NPO DVT prophylaxis: {anticoagulation (Optional):25687} GI prophylaxis: {EP:32951} Lines: {Central Venous Access:25771} Foley:  {Central Venous Access:25691} Code Status:  {Code Status:26939} Last date of multidisciplinary goals of care discussion [***]  Labs   CBC: Recent Labs  Lab 12/04/21 1850  WBC 5.1  NEUTROABS 3.3  HGB 12.8  HCT 38.8  MCV 95.8  PLT 884    Basic Metabolic Panel: Recent Labs  Lab 12/04/21 1850  NA 134*  K 3.7  CL 102  CO2 23  GLUCOSE 86  BUN 18  CREATININE 0.86  CALCIUM 8.7*   GFR: Estimated Creatinine  Clearance: 54 mL/min (by C-G formula based on SCr of 0.86 mg/dL). Recent Labs  Lab 12/04/21 1850  WBC 5.1    Liver Function Tests: Recent Labs  Lab 12/04/21 1850  AST 25  ALT 22  ALKPHOS 61  BILITOT 1.0  PROT 6.8  ALBUMIN 4.0   No results for input(s): "LIPASE", "AMYLASE" in the last 168 hours. No results for input(s): "AMMONIA" in the last 168 hours.  ABG No results found for: "PHART", "PCO2ART", "PO2ART", "HCO3", "TCO2", "ACIDBASEDEF", "O2SAT"   Coagulation Profile: Recent Labs  Lab 12/04/21 1850  INR 1.0    Cardiac Enzymes: No results for input(s): "CKTOTAL", "CKMB", "CKMBINDEX", "TROPONINI" in the last 168 hours.  HbA1C: Hgb A1c MFr Bld  Date/Time Value Ref Range Status  04/20/2020 03:55 AM 5.0 4.8 - 5.6 % Final    Comment:    (NOTE) Pre diabetes:          5.7%-6.4%  Diabetes:              >6.4%  Glycemic control for   <7.0% adults with diabetes   02/29/2020 05:47 AM 5.0 4.8 - 5.6 % Final    Comment:    (NOTE) Pre diabetes:          5.7%-6.4%  Diabetes:              >6.4%  Glycemic control for   <7.0% adults with diabetes     CBG: Recent Labs  Lab 12/04/21 1848  GLUCAP 97    Review of Systems:   ***  Past Medical History:  She,  has a past medical history of Acute bronchospasm due to viral infection (2020), Acute ischemic left MCA stroke ([place]) (04/19/2020), Allergy, Anemia, Angina at rest, Breast cancer, left ([place]) (08/21/2021), Cataract, Depressive disorder, Diverticulosis, Dyspnea, Epistaxis, Fatigue, Hearing loss, History of 2019 novel coronavirus disease (COVID-19) (12/18/2020), History of ischemic multifocal posterior circulation stroke (02/28/2020), History of loop recorder, History of radiation therapy, HLD (hyperlipidemia), Hypothyroidism, LAFB (left anterior fascicular block), Long term current use of antithrombotics/antiplatelets, Memory change, Migraine, Osteoarthritis, Osteoporosis, Palpitations, Premature menopause, QT  prolongation, Seizures ([place]) (1994), Stage I adenocarcinoma of endometrium ([place]) (12/18/2020), and Vitamin D deficiency.   Surgical History:   Past Surgical History:  Procedure Laterality Date   ABDOMINAL HYSTERECTOMY  12/18/2020   ABLATION  2006   AXILLARY SENTINEL NODE BIOPSY Left 09/25/2021   Procedure: AXILLARY SENTINEL NODE BIOPSY;  Surgeon: Robert Bellow, MD;  Location: [facility] ORS;  Service: General;  Laterality: Left;   BREAST BIOPSY Left 08/21/2021   Stereo Bx, X-clip; path --> IMC (G1, ER/PR +, Her2/neu -)   BREAST LUMPECTOMY WITH NEEDLE LOCALIZATION Left 09/25/2021   Procedure: BREAST LUMPECTOMY WITH NEEDLE LOCALIZATION;  Surgeon: Robert Bellow, MD;  Location: [facility] ORS;  Service: General;  Laterality: Left;   BUBBLE STUDY  04/23/2020   Procedure: BUBBLE STUDY;  Surgeon: Larey Dresser, MD;  Location: [place];  Service: Cardiovascular;;   CATARACT EXTRACTION W/ INTRAOCULAR LENS IMPLANT Bilateral    COLONOSCOPY     COLONOSCOPY WITH PROPOFOL N/A 12/22/2018   Procedure: COLONOSCOPY WITH PROPOFOL;  Surgeon: Virgel Manifold, MD;  Location: [facility] ENDOSCOPY;  Service: Endoscopy;  Laterality: N/A;   EYE SURGERY  Spring 2018   Cataracts both eyes   HYSTEROSCOPY WITH D & C N/A 11/01/2020   Procedure: DILATATION AND CURETTAGE /HYSTEROSCOPY WITH MYOSURE;  Surgeon: Lafonda Mosses, MD;  Location: [facility];  Service: Gynecology;  Laterality: N/A;   LAPAROTOMY  12/18/2020   Procedure: LAPAROTOMY;  Surgeon: Lafonda Mosses, MD;  Location: [facility];  Service: Gynecology;;   LOOP RECORDER INSERTION N/A 04/23/2020   Procedure: LOOP RECORDER INSERTION;  Surgeon: Evans Lance, MD;  Location: [place] [facility];  Service: Cardiovascular;  Laterality: N/A;   ROBOTIC ASSISTED TOTAL HYSTERECTOMY WITH BILATERAL SALPINGO OOPHERECTOMY Bilateral 12/18/2020   Procedure: XI ROBOTIC ASSISTED TOTAL HYSTERECTOMY WITH BILATERAL SALPINGO OOPHORECTOMY;  Surgeon: Lafonda Mosses, MD;   Location: [facility];  Service: Gynecology;  Laterality: Bilateral;   SENTINEL NODE BIOPSY N/A 12/18/2020   Procedure: SENTINEL NODE BIOPSY;  Surgeon: Lafonda Mosses, MD;  Location: [facility];  Service: Gynecology;  Laterality: N/A;   TEE WITHOUT CARDIOVERSION N/A 04/23/2020   Procedure: TRANSESOPHAGEAL ECHOCARDIOGRAM (TEE);  Surgeon: Larey Dresser, MD;  Location: [facility] END[facility];  Service: Cardiovascular;  Laterality: N/A;   TONSILLECTOMY       Social History:   reports that she quit smoking about 47 years ago. Her smoking use included cigarettes. She has a 3.00 pack-year smoking history. She has never used smokeless tobacco. She reports that she does not currently use alcohol. She reports that she does not use drugs.   Family History:  Her family history includes Alzheimer's disease in her mother; Atrial fibrillation in her brother and brother; Cancer in her father; Dementia in her maternal aunt and maternal grandmother; Depression in her father; Diabetes in her father; Early death in her father; Hearing loss in her father; Heart disease in her brother, brother, maternal aunt, and maternal uncle. There is no history of Colon cancer, Breast cancer, Ovarian cancer, Pancreatic cancer, or Endometrial cancer.   Allergies Allergies  Allergen Reactions   Sulfamethoxazole-Trimethoprim Hives     Home Medications  Prior to Admission medications   Medication Sig Start Date End Date Taking? Authorizing Provider  alendronate (FOSAMAX) 70 MG tablet Take 1 tablet (70 mg total) by mouth once a week. Take with a full glass of water on an empty stomach. 10/02/21   Sindy Guadeloupe, MD  aspirin 81 MG EC tablet Take by mouth.    [provider]  atorvastatin (LIPITOR) 40 MG tablet Take 1 tablet (40 mg total) by mouth daily. 09/10/21   Sowles,  Drue Stager, MD  buPROPion [facility] SR) 150 MG 12 hr tablet Take 2 tablets (300 mg total) by mouth daily. 06/18/21   [name], MD  citalopram (CELEXA) 40 MG  tablet TAKE 1 TABLET BY MOUTH EVERY DAY 08/18/21   [name], MD  clopidogrel (PLAVIX) 75 MG tablet Take 1 tablet (75 mg total) by mouth daily. 09/30/21   Robert Bellow, MD  Cyanocobalamin (VITAMIN B-12) 1000 MCG SUBL Place 1 tablet (1,000 mcg total) under the tongue 2 (two) times a week. 06/20/21   [name], MD  HYDROcodone-acetaminophen (NORCO/VICODIN) 5-325 MG tablet Take 1 tablet by mouth every 4 (four) hours as needed for moderate pain. 09/25/21 09/25/22  Robert Bellow, MD  levothyroxine (SYNTHROID) 75 MCG tablet TAKE 1 TABLET(75 MCG) BY MOUTH DAILY BEFORE BREAKFAST 08/18/21   [name], MD  lidocaine-prilocaine (EMLA) cream Apply to areola one hour prior to arrival day of procedure. Cover with saran wrap. 08/26/21   [provider]  Multiple Vitamin (MULTIVITAMIN WITH MINERALS) TABS tablet Take 1 tablet by mouth daily.    [provider]  polyvinyl alcohol (LIQUIFILM TEARS) 1.4 % ophthalmic solution Place 1 drop into both eyes daily as needed for dry eyes.    [provider]  Ubrogepant (UBRELVY) 100 MG TABS Take 100 mg by mouth daily as needed (migraines).    [provider]  Vitamin D, Ergocalciferol, (DRISDOL) 1.25 MG (50000 UNIT) CAPS capsule Take 1 capsule (50,000 Units total) by mouth every 14 (fourteen) days. 06/18/21   [name], MD     Critical care time: ***

## 2021-12-04 NOTE — ED Provider Notes (Signed)
[facility] Provider Note    Event Date/Time   First MD Initiated Contact with Patient 12/04/21 1851     (approximate)   History   Dizziness   HPI  Natalie [name] is a 71 y.o. female with a history of CVA, breast cancer who presents with dizziness and extremity weakness.  Code stroke activated from triage, last known well 6 PM     Physical Exam   Triage Vital Signs: ED Triage Vitals  Enc Vitals Group     BP 12/04/21 1828 137/80     Pulse Rate 12/04/21 1828 71     Resp 12/04/21 1828 19     Temp 12/04/21 1828 (!) 97.5 F (36.4 C)     Temp Source 12/04/21 1828 Oral     SpO2 12/04/21 1828 98 %     Weight 12/04/21 1829 66.7 kg (147 lb)     Height --      Head Circumference --      Peak Flow --      Pain Score --      Pain Loc --      Pain Edu? --      Excl. in [place]? --     Most recent vital signs: Vitals:   12/04/21 2040 12/04/21 2054  BP: 138/84 137/76  Pulse: 72 70  Resp: 18 20  Temp: 97.8 F (36.6 C) 97.6 F (36.4 C)  SpO2: 100% 100%     General: Awake, no distress.  CV:  Good peripheral perfusion.  Resp:  Normal effort.  Abd:  No distention.  Other:  Left lower extremity weakness, difficulty raising against gravity's, primary neurological symptom   ED Results / Procedures / Treatments   Labs (all labs ordered are listed, but only abnormal results are displayed) Labs Reviewed  COMPREHENSIVE METABOLIC PANEL - Abnormal; Notable for the following components:      Result Value   Sodium 134 (*)    Calcium 8.7 (*)    All other components within normal limits  PROTIME-INR  APTT  CBC  DIFFERENTIAL  ETHANOL  CBC  BASIC METABOLIC PANEL  MAGNESIUM  PHOSPHORUS  LIPID PANEL  HEMOGLOBIN A1C  I-STAT CREATININE, ED  CBG MONITORING, ED     EKG  ED ECG REPORT I, Lavonia Drafts, the attending physician, personally viewed and interpreted this ECG.  Date: 12/04/2021  Rhythm: normal sinus rhythm QRS Axis:  normal Intervals: Abnormal ST/T Wave abnormalities: normal Narrative Interpretation: no evidence of acute ischemia    RADIOLOGY CT head viewed interpret by me, no acute abnormality, confirmed by radiologist    PROCEDURES:  Critical Care performed: yes  CRITICAL CARE Performed by: Lavonia Drafts   Total critical care time: 30 minutes  Critical care time was exclusive of separately billable procedures and treating other patients.  Critical care was necessary to treat or prevent imminent or life-threatening deterioration.  Critical care was time spent personally by me on the following activities: development of treatment plan with patient and/or surrogate as well as nursing, discussions with consultants, evaluation of patient's response to treatment, examination of patient, obtaining history from patient or surrogate, ordering and performing treatments and interventions, ordering and review of laboratory studies, ordering and review of radiographic studies, pulse oximetry and re-evaluation of patient's condition.   Procedures   MEDICATIONS ORDERED IN ED: Medications  sodium chloride flush (NS) 0.9 % injection 3 mL (3 mLs Intravenous Not Given 12/04/21 1938)  docusate sodium (COLACE) capsule 100 mg (has  no administration in time range)  polyethylene glycol (MIRALAX / GLYCOLAX) packet 17 g (has no administration in time range)   stroke: early stages of recovery book (has no administration in time range)  0.9 %  sodium chloride infusion (has no administration in time range)  acetaminophen (TYLENOL) tablet 650 mg (has no administration in time range)    Or  acetaminophen (TYLENOL) 160 MG/5ML solution 650 mg (has no administration in time range)    Or  acetaminophen (TYLENOL) suppository 650 mg (has no administration in time range)  pantoprazole (PROTONIX) injection 40 mg (has no administration in time range)  labetalol (NORMODYNE) injection 10 mg (has no administration in time  range)  tenecteplase (TNKASE) injection for Stroke 17 mg (17 mg Intravenous Given 12/04/21 1924)  iohexol (OMNIPAQUE) 350 MG/ML injection 75 mL (75 mLs Intravenous Contrast Given 12/04/21 1928)  methylPREDNISolone sodium succinate (SOLU-MEDROL) 125 mg/2 mL injection 125 mg (125 mg Intravenous Given 12/04/21 1950)  diphenhydrAMINE (BENADRYL) injection 50 mg (50 mg Intravenous Given 12/04/21 1949)  famotidine (PEPCID) IVPB 20 mg premix (0 mg Intravenous Stopped 12/04/21 2020)     IMPRESSION / MDM / ASSESSMENT AND PLAN / ED COURSE  I reviewed the triage vital signs and the nursing notes. Patient's presentation is most consistent with acute presentation with potential threat to life or bodily function.   Patient presents with dizziness, lower extremity weakness, concerning for CVA.  She is within the window for TNK.  Code stroke activated.  Lab work is reassuring, CBC unremarkable, BMP unremarkable  Patient seen promptly by neurology and evaluated, Dr. Leonel Ramsay has recommended and ordered TNK which was given in the emergency department.  Status post TNK patient developed swelling of the left upper lip, Benadryl and Solu-Medrol given and Pepcid.  Monitored closely over 1 hour with very minimal increase in swelling.  No intraoral swelling.  Discussed with ICU for admission,       FINAL CLINICAL IMPRESSION(S) / ED DIAGNOSES   Final diagnoses:  Cerebrovascular accident (CVA), unspecified mechanism ([place])     Rx / DC Orders   ED Discharge Orders     None        Note:  This document was prepared using Dragon voice recognition software and may include unintentional dictation errors.   Lavonia Drafts, MD 12/04/21 2108

## 2021-12-04 NOTE — Progress Notes (Signed)
1820 arrival via [place]- cart activated, patient already got ncct 1844 paged [facility] neuro 1847-[place] on screen 1907 order for tnk in 1924 tnk pushed (BP 149-75) 1925 back down to scan for advanced 1934 back in room from advanced, neuro on screen. Patient left lip is swelling slightly. Neuro aware as well as EDP who came bedside.Patient in no distress at time of cart logoff

## 2021-12-04 NOTE — Consult Note (Addendum)
[facility] Telemedicine Consult   Requesting Provider: Corky Downs, R Consult Participants: Patient, telestroke, bedside nurse.   This consult was provided via telemedicine with 2-way video and audio communication. The patient/family was informed that care would be provided in this way and agreed to receive care in this manner.    Chief Complaint: Dizziness  HPI: 71 year old female with a history of multiple embolic appearing strokes currently managed on aspirin and Plavix as well as endometrial that adenocarcinoma early grade, early stage who has recently been undergoing radiation.  She was in her normal state of health until 6 PM at which point she began having "dizziness" by which she primarily means unsteadiness.  She states that she was unable to walk.  This is present both at rest and with movement.  She presented to the emergency department and a code stroke was activated.  On evaluation, it was found that she was having significant difficulty lifting her left leg, though when lifted she was able to keep it aloft though it did drift.  With performing heel-knee-shin, though there was no ataxia, she had significant difficulty performing it with the left leg.  Based on this assessment, and the fact that she endorses being unable to walk, this is felt to be a disabling deficit and therefore I offered tenecteplase and discussed the risks, benefits, and alternatives.  He agreed with proceeding with tenecteplase.    LKW: 6 PM tnk given?:  Yes IR Thrombectomy? No, out of window Modified Rankin Scale: 0-Completely asymptomatic and back to baseline post- stroke Time of teleneurologist evaluation: 18:47 Time of TNK order: 19:07  Exam: Vitals:   12/04/21 1845 12/04/21 1900  BP: (!) 149/83 (!) 155/79  Pulse:  73  Resp: (!) 28 20  Temp:    SpO2:  100%    General: In bed, NAD  1A: Level of Consciousness - 0 1B: Ask Month and Age - 0 1C: 'Blink Eyes' & 'Squeeze Hands' - 0 2: Test  Horizontal Extraocular Movements - 0 3: Test Visual Fields - 0 4: Test Facial Palsy - 0 5A: Test Left Arm Motor Drift - 0 5B: Test Right Arm Motor Drift - 0 6A: Test Left Leg Motor Drift - 1 6B: Test Right Leg Motor Drift - 0 7: Test Limb Ataxia - 0 8: Test Sensation - 1 9: Test Language/Aphasia- 0 10: Test Dysarthria - 0 11: Test Extinction/Inattention - 0 NIHSS score: 2 She was unable to walk and left leg dysfunction was out of proportion to NIH score, and it appears to be a disabling deficit.    Imaging Reviewed: CT head - negative  Labs reviewed in epic and pertinent values follow: Glucose 86 Cr 0.86   Assessment: 71 yo F with acute dizziness and left leg weakness. Given that she is unable to walk, this is a disabling deficit and therefore TNK was offered. She has a history of multiple embolic appearing strokes. She has been on eliquis in the past, but is currently on anti-platelet therapy.  She will need to be admitted for close monitoring in the post TNK setting.  She did develop some lip swelling which is concerning for developing angioedema which can be seen with thrombolytic therapy.  She will need to be closely monitored for further progression of this.  Recommendations:  - HgbA1c, fasting lipid panel - MRI of the brain without contrast - Frequent neuro checks - Echocardiogram - Maintain BP < 180/105 - Prophylactic therapy-none for 24 hours - Risk factor modification -  Telemetry monitoring -Solu-Medrol, Benadryl, Pepcid for angioedema - PT consult, OT consult, Speech consult - Stroke team to follow     This patient is receiving care for possible acute neurological changes. There was 60 minutes of care by this provider at the time of service, including time for direct evaluation via telemedicine, review of medical records, imaging studies and discussion of findings with providers, the patient and/or family.  Roland Rack, MD [facility] [phone]  If 7pm- 7am, please page neurology on call as listed in [place].

## 2021-12-04 NOTE — ED Notes (Signed)
Tele-Neuro exam in progress

## 2021-12-04 NOTE — ED Notes (Signed)
TNK to be given, discussed with pt via tele neuro Dr. Leonel Ramsay, pharm D notified. Pending arrival.

## 2021-12-04 NOTE — ED Notes (Signed)
Called Code Stroke to [place]

## 2021-12-05 ENCOUNTER — Encounter: Payer: Self-pay | Admitting: Family Medicine

## 2021-12-05 ENCOUNTER — Inpatient Hospital Stay: Payer: Medicare Other

## 2021-12-05 ENCOUNTER — Inpatient Hospital Stay
Admit: 2021-12-05 | Discharge: 2021-12-05 | Disposition: A | Discharge status: Home / Self Care | Payer: Medicare Other | Attending: Pulmonary Disease | Admitting: Pulmonary Disease

## 2021-12-05 ENCOUNTER — Other Ambulatory Visit: Payer: Self-pay

## 2021-12-05 ENCOUNTER — Encounter: Payer: Self-pay | Admitting: Internal Medicine

## 2021-12-05 DIAGNOSIS — I639 Cerebral infarction, unspecified: Secondary | ICD-10-CM

## 2021-12-05 LAB — CBC
HCT: 35.2 % — ABNORMAL LOW (ref 36.0–46.0)
Hemoglobin: 11.7 g/dL — ABNORMAL LOW (ref 12.0–15.0)
MCH: 31.7 pg (ref 26.0–34.0)
MCHC: 33.2 g/dL (ref 30.0–36.0)
MCV: 95.4 fL (ref 80.0–100.0)
Platelets: 156 10*3/uL (ref 150–400)
RBC: 3.69 MIL/uL — ABNORMAL LOW (ref 3.87–5.11)
RDW: 12.3 % (ref 11.5–15.5)
WBC: 2.1 10*3/uL — ABNORMAL LOW (ref 4.0–10.5)
nRBC: 0 % (ref 0.0–0.2)

## 2021-12-05 LAB — MAGNESIUM: Magnesium: 2.2 mg/dL (ref 1.7–2.4)

## 2021-12-05 LAB — GLUCOSE, CAPILLARY
Glucose-Capillary: 170 mg/dL — ABNORMAL HIGH (ref 70–99)
Glucose-Capillary: 108 mg/dL — ABNORMAL HIGH (ref 70–99)
Glucose-Capillary: 225 mg/dL — ABNORMAL HIGH (ref 70–99)
Glucose-Capillary: 108 mg/dL — ABNORMAL HIGH (ref 70–99)
Glucose-Capillary: 168 mg/dL — ABNORMAL HIGH (ref 70–99)

## 2021-12-05 LAB — LIPID PANEL
Cholesterol: 83 mg/dL (ref 0–200)
HDL: 34 mg/dL — ABNORMAL LOW (ref >40)
Total CHOL/HDL Ratio: 2.4 RATIO
Triglycerides: <10 mg/dL (ref <150)

## 2021-12-05 LAB — BASIC METABOLIC PANEL
Anion gap: 6 (ref 5–15)
BUN: 17 mg/dL (ref 8–23)
CO2: 25 mmol/L (ref 22–32)
Calcium: 8.1 mg/dL — ABNORMAL LOW (ref 8.9–10.3)
Chloride: 107 mmol/L (ref 98–111)
Creatinine, Ser: 0.81 mg/dL (ref 0.44–1.00)
GFR, Estimated: >60 mL/min (ref >60)
Glucose, Bld: 145 mg/dL — ABNORMAL HIGH (ref 70–99)
Potassium: 4.4 mmol/L (ref 3.5–5.1)
Sodium: 138 mmol/L (ref 135–145)

## 2021-12-05 LAB — HEMOGLOBIN A1C
Hgb A1c MFr Bld: 4.9 % (ref 4.8–5.6)
Mean Plasma Glucose: 93.93 mg/dL

## 2021-12-05 LAB — MRSA NEXT GEN BY PCR, NASAL: MRSA by PCR Next Gen: NOT DETECTED

## 2021-12-05 LAB — PHOSPHORUS: Phosphorus: 3.2 mg/dL (ref 2.5–4.6)

## 2021-12-05 MED ORDER — BUPROPION HCL ER (SR) 150 MG PO TB12
150.0000 mg | ORAL_TABLET | Freq: Two times a day (BID) | ORAL | Status: DC
  Administered 2021-12-05 – 2021-12-06 (×3): 150 mg via ORAL
  Filled 2021-12-05 (×4): qty 1

## 2021-12-05 MED ORDER — CHLORHEXIDINE GLUCONATE CLOTH 2 % EX PADS
6.0000 | MEDICATED_PAD | Freq: Every day | CUTANEOUS | Status: DC
  Administered 2021-12-05 – 2021-12-06 (×2): 6 via TOPICAL

## 2021-12-05 MED ORDER — LACTATED RINGERS IV BOLUS
250.0000 mL | Freq: Once | INTRAVENOUS | Status: AC
  Administered 2021-12-05: 250 mL via INTRAVENOUS

## 2021-12-05 MED ORDER — LACTATED RINGERS IV SOLN: INTRAVENOUS | Status: DC

## 2021-12-05 MED ORDER — METHYLPREDNISOLONE SODIUM SUCC 40 MG IJ SOLR: 20.0000 mg | Freq: Every day | INTRAMUSCULAR | Status: DC

## 2021-12-05 MED ORDER — INSULIN ASPART 100 UNIT/ML IJ SOLN
0.0000 [IU] | INTRAMUSCULAR | Status: DC
  Administered 2021-12-05: 5 [IU] via SUBCUTANEOUS
  Administered 2021-12-05 (×3): 3 [IU] via SUBCUTANEOUS
  Administered 2021-12-06: 2 [IU] via SUBCUTANEOUS
  Filled 2021-12-05 (×5): qty 1

## 2021-12-05 MED ORDER — ORAL CARE MOUTH RINSE: 15.0000 mL | OROMUCOSAL | Status: DC | PRN

## 2021-12-05 MED ORDER — CITALOPRAM HYDROBROMIDE 10 MG PO TABS
40.0000 mg | ORAL_TABLET | Freq: Every day | ORAL | Status: DC
  Administered 2021-12-05 – 2021-12-06 (×2): 40 mg via ORAL
  Filled 2021-12-05 (×2): qty 2

## 2021-12-05 MED ORDER — ATORVASTATIN CALCIUM 20 MG PO TABS
40.0000 mg | ORAL_TABLET | Freq: Every day | ORAL | Status: DC
  Administered 2021-12-05 – 2021-12-06 (×2): 40 mg via ORAL
  Filled 2021-12-05 (×2): qty 2

## 2021-12-05 MED ORDER — LEVOTHYROXINE SODIUM 50 MCG PO TABS
75.0000 ug | ORAL_TABLET | Freq: Every day | ORAL | Status: DC
  Administered 2021-12-05 – 2021-12-06 (×2): 75 ug via ORAL
  Filled 2021-12-05 (×2): qty 2

## 2021-12-05 NOTE — Progress Notes (Signed)
Subjective: She feels much better  Exam: Vitals:   12/05/21 0925 12/05/21 1000  BP: 109/66 117/71  Pulse: 68 71  Resp: 16 17  Temp:    SpO2: 100% 100%   Gen: In bed, NAD Resp: non-labored breathing, no acute distress Abd: soft, nt  Neuro: MS: Awake, alert, oriented and appropriate CN: Pupils equal round and reactive to light Motor: 5/5 throughout including the left leg Sensory: She has very mild decreased to light touch in the left leg, much improved from yesterday  Pertinent Labs: Total cholesterol is 83 with HDL of 34 so LDL is clearly less than 70 even though it was not calculated A1c of 4.9  Impression: 71 year old female who presented with cute onset of disequilibrium and left leg weakness status post IV tenecteplase.  She has markedly improved status post tenecteplase, with options at this point being stroke aborted by thrombolytic therapy versus stroke with improvement.  MRI is currently pending and stroke work-up is underway.  She has a history of multiple episodes of embolic phenomena, I wonder if she is hypercoagulable due to her adenocarcinoma, though it is an early stage early grade cancer.  Recommendations: 1) continue home statin 2) antithrombotic therapy pending MRI. 3) echo, telemetry 4) PT, OT, ST once more than 24 hours from thrombolytic therapy.   Roland Rack, MD [facility] [phone]  If 7pm- 7am, please page neurology on call as listed in [place].

## 2021-12-05 NOTE — Progress Notes (Signed)
CHIEF COMPLAINT:   Chief Complaint  Patient presents with   Dizziness    Subjective  Acute CVA S/p TPA Symptoms revolved Neurology following Alert and awake follows commands       Objective     Review of Systems: Gen:  Denies  fever, sweats, chills weight loss  HEENT: Denies blurred vision, double vision, ear pain, eye pain, hearing loss, nose bleeds, sore throat Cardiac:  No dizziness, chest pain or heaviness, chest tightness,edema, No JVD Resp:   No cough, -sputum production, -shortness of breath,-wheezing, -hemoptysis,  Other:  All other systems negative  BP 103/64 (BP Location: Right Arm)   Pulse 69   Temp 98.2 F (36.8 C) (Oral)   Resp 17   Wt 67 kg   SpO2 97%   BMI 24.58 kg/m    Physical Examination:   General Appearance: No distress  EYES PERRLA, EOM intact.   NECK Supple, No JVD Pulmonary: normal breath sounds, No wheezing.  CardiovascularNormal S1,S2.  No m/r/g.   Abdomen: Benign, Soft, non-tender. ALL OTHER ROS ARE NEGATIVE  I personally reviewed Labs under Results section.  Radiology Reports CT ANGIO HEAD NECK W WO CM (CODE STROKE)  Result Date: 12/04/2021 CLINICAL DATA:  Initial evaluation for neuro deficit, stroke suspected, left lower extremity weakness. EXAM: CT ANGIOGRAPHY HEAD AND NECK TECHNIQUE: Multidetector CT imaging of the head and neck was performed using the standard protocol during bolus administration of intravenous contrast. Multiplanar CT image reconstructions and MIPs were obtained to evaluate the vascular anatomy. Carotid stenosis measurements (when applicable) are obtained utilizing NASCET criteria, using the distal internal carotid diameter as the denominator. RADIATION DOSE REDUCTION: This exam was performed according to the departmental dose-optimization program which includes automated exposure control, adjustment of the mA and/or kV according to patient size and/or use of iterative reconstruction technique. CONTRAST:  2m  OMNIPAQUE IOHEXOL 350 MG/ML SOLN COMPARISON:  Head CT from earlier the same day. FINDINGS: CTA NECK FINDINGS Aortic arch: Visualized aortic arch normal caliber with normal branch pattern. No stenosis about the origin of the great vessels. Right carotid system: Right common and internal carotid arteries widely patent without stenosis, dissection or occlusion. Left carotid system: Left common and internal carotid arteries widely patent without stenosis, dissection or occlusion. Vertebral arteries: Both vertebral arteries arise from the subclavian arteries. No proximal subclavian artery stenosis. Right vertebral artery dominant. Vertebral arteries patent without stenosis or dissection. Skeleton: Mild-to-moderate spondylosis noted at C3-4 through C6-7. Benign hemangioma noted at T2. No worrisome osseous lesions. Other neck: No other acute soft tissue abnormality within the neck. Upper chest: Visualized upper chest demonstrates no acute finding. Review of the MIP images confirms the above findings CTA HEAD FINDINGS Anterior circulation: Both internal carotid arteries widely patent to the termini without stenosis. A1 segments widely patent. Normal anterior communicating artery complex. Both anterior cerebral arteries widely patent to their distal aspects without stenosis. No M1 stenosis or occlusion. Normal MCA bifurcations. Distal MCA branches well perfused and symmetric. Posterior circulation: Both V4 segments patent to the vertebrobasilar junction without stenosis. Both PICA patent. Basilar patent to its distal aspect without stenosis. Superior cerebral arteries patent bilaterally. Right PCA primarily supplied via the basilar. Predominant fetal type origin of the left PCA. Both PCAs widely patent to their distal aspects. Venous sinuses: Patent allowing for timing the contrast bolus. Anatomic variants: As above.  No aneurysm. Review of the MIP images confirms the above findings IMPRESSION: Negative CTA of the head and  neck. No large  vessel occlusion or other emergent finding. No hemodynamically significant or correctable stenosis. Electronically Signed   By: Jeannine Boga M.D.   On: 12/04/2021 20:12   CT HEAD CODE STROKE WO CONTRAST  Result Date: 12/04/2021 CLINICAL DATA:  Code stroke.  Acute neuro deficit.  Rule out stroke. EXAM: CT HEAD WITHOUT CONTRAST TECHNIQUE: Contiguous axial images were obtained from the base of the skull through the vertex without intravenous contrast. RADIATION DOSE REDUCTION: This exam was performed according to the departmental dose-optimization program which includes automated exposure control, adjustment of the mA and/or kV according to patient size and/or use of iterative reconstruction technique. COMPARISON:  CT head 08/05/2020 FINDINGS: Brain: No evidence of acute infarction, hemorrhage, hydrocephalus, extra-axial collection or mass lesion/mass effect. Vascular: Negative for hyperdense vessel Skull: Negative Sinuses/Orbits: Paranasal sinuses clear. Bilateral cataract extraction Other: None ASPECTS ([place] Stroke Program Early CT Score) - Ganglionic level infarction (caudate, lentiform nuclei, internal capsule, insula, M1-M3 cortex): 7 - Supraganglionic infarction (M4-M6 cortex): 3 Total score (0-10 with 10 being normal): 10 IMPRESSION: 1. Negative CT head. 2. Aspects is 10. 3. These results were called by telephone at the time of interpretation on 12/04/2021 at 6:45 pm to provider Corky Downs, who verbally acknowledged these results. Electronically Signed   By: Franchot Gallo M.D.   On: 12/04/2021 18:46       Assessment/Plan:  ACUTE CVA S/P TPA FOLLOW UP NEURO RECS-STROKE ORDER SET IN PLACE PT/OT   ENDO - ICU hypoglycemic\Hyperglycemia protocol -check FSBS per protocol   GI GI PROPHYLAXIS as indicated  NUTRITIONAL STATUS DIET-->as tolerated Constipation protocol as indicated   ELECTROLYTES -follow labs as needed -replace as needed -pharmacy consultation and  following   SD STATUS  TRANSFER TO TRH   Maretta Bees Patricia Pesa, M.D.  [facility]  Medical Director [place] Director [place] Department

## 2021-12-05 NOTE — Progress Notes (Signed)
OT Cancellation Note  Patient Details Name: Natalie [name] MRN: [id number] DOB: 1950-08-22   Cancelled Treatment:    Reason Eval/Treat Not Completed: Patient not medically ready. Consult received, chart reviewed. Consult received and chart reviewed.  Patient admitted for CVA work up with TNKase infusion (ending at 1924, 12/04/21).  Per guidelines, to be on strict bedrest x24 hours post infusion and follow up imaging required to be completed prior to initiation of therapy.  Will continue to follow and initiate as medically appropriate.  Ardeth Perfect., MPH, MS, OTR/L ascom [phone] 12/05/21, 7:47 AM

## 2021-12-05 NOTE — Plan of Care (Signed)
  Problem: Education: Goal: Knowledge of disease or condition will improve Outcome: Progressing Goal: Knowledge of secondary prevention will improve (MUST DOCUMENT ALL) Outcome: Progressing Goal: Knowledge of patient specific risk factors will improve Elta [place] N/A or DELETE if not current risk factor) Outcome: Progressing   Problem: Ischemic Stroke/TIA Tissue Perfusion: Goal: Complications of ischemic stroke/TIA will be minimized Outcome: Progressing

## 2021-12-05 NOTE — Progress Notes (Signed)
[place] Progress Note Patient Name: Natalie [name] DOB: 06/21/1950 MRN: [id number]   Date of Service  12/05/2021  HPI/Events of Note  61 F history of CVA (Left MCA 2022), breast Can hypothyroidism, presented with dizziness and weakness with drifting to one side. Code stroke activated NIHSS 2 given TNKase with lip swelling post. Unclear if this is due to the [facility] or contrast given for head CT. Resolved with antihistamines and steroids.  Seen awake and not in distress  eICU Interventions   ICU admission for post TNK monitoring as well as monitoring for recurrence of what seems like angioedema     Intervention Category Evaluation Type: New Patient Evaluation  Judd Lien 12/05/2021, 12:15 AM

## 2021-12-05 NOTE — Progress Notes (Signed)
*  PRELIMINARY RESULTS* Echocardiogram 2D Echocardiogram has been performed.  Natalie [name] 12/05/2021, 9:23 AM

## 2021-12-05 NOTE — Progress Notes (Addendum)
SLP Cancellation Note  Patient Details Name: Natalie [name] MRN: [id number] DOB: [birth year]   Cancelled treatment:       Reason Eval/Treat Not Completed: SLP screened, no needs identified, will sign off (chart reviewed; consulted NSG then met w/ pt). Neurology notes read.  Pt denied any difficulty swallowing and is currently on a regular diet; tolerates swallowing pills w/ water per NSG.  Pt conversed in conversation w/out overt expressive/receptive deficits noted; pt denied any new/current speech-language deficits. Speech clear and 100% intelligible. Pt endorsed intermittent episodes of decreased articulation or "not finding a word when I am talking" but that this occurred "after my major stroke I had about a year ago" -- NO new changes to this, per pt. Reviewed and encouraged general strategies including slowing down/pausing during conversation to allow time for organizing her thought(s)/speech and reducing distractions in the environment during conversation. Also encouraged pt to talk w/ her PCP for a referral to Outpatient ST services for further education, post discharge home if she desires. Pt agreed.  No further skilled ST services indicated as pt appears at her Baseline w/ No new changes in status. Pt agreed. NSG to reconsult if any change in status while admitted.      Orinda Kenner, [place], CCC-SLP Speech Language Pathologist Rehab Services; [place] [phone] (ascom) Vanesha [place] 12/05/2021, 11:04 AM

## 2021-12-05 NOTE — Progress Notes (Signed)
PT Cancellation Note  Patient Details Name: Natalie [name] MRN: [id number] DOB: 08/04/1950   Cancelled Treatment:    Reason Eval/Treat Not Completed: Medical issues which prohibited therapy (Consult received and chart reviewed. Per chart, patient s/p TNK administration for acute-stroke symptoms (1924, 12/04/21). Per guidelines, bedrest x24 hours.  Will continue to follow and initiate as medically appropriate.)   [name] H. Owens Shark, PT, DPT, NCS 12/05/21, 7:52 AM [phone]

## 2021-12-05 NOTE — Progress Notes (Signed)
Patient transported to MRI with CN RN Holli Humbles at 1330.

## 2021-12-05 NOTE — Progress Notes (Signed)
Patient returned to ICU with Cn RN Sarah at [phone]. Vitals checked upon return and NIH assessment for 1400 completed at 1420 and documented. Patient purewick and mesh panties replaced. Resting comfortably in bed with daughter as bedside. Will continue to assess closely.

## 2021-12-06 ENCOUNTER — Telehealth: Payer: Self-pay | Admitting: *Deleted

## 2021-12-06 DIAGNOSIS — R2990 Unspecified symptoms and signs involving the nervous system: Secondary | ICD-10-CM

## 2021-12-06 DIAGNOSIS — I639 Cerebral infarction, unspecified: Secondary | ICD-10-CM | POA: Diagnosis not present

## 2021-12-06 LAB — ECHOCARDIOGRAM COMPLETE
AR max vel: 2.6 cm2
AV Area VTI: 2.67 cm2
AV Area mean vel: 2.55 cm2
AV Mean grad: 4 mmHg
AV Peak grad: 8 mmHg
Ao pk vel: 1.41 m/s
Area-P 1/2: 3.39 cm2
S' Lateral: 2.6 cm
Weight: 2363.33 oz

## 2021-12-06 LAB — BASIC METABOLIC PANEL
Anion gap: 6 (ref 5–15)
BUN: 23 mg/dL (ref 8–23)
CO2: 26 mmol/L (ref 22–32)
Calcium: 8 mg/dL — ABNORMAL LOW (ref 8.9–10.3)
Chloride: 107 mmol/L (ref 98–111)
Creatinine, Ser: 0.72 mg/dL (ref 0.44–1.00)
GFR, Estimated: >60 mL/min (ref >60)
Glucose, Bld: 119 mg/dL — ABNORMAL HIGH (ref 70–99)
Potassium: 3.6 mmol/L (ref 3.5–5.1)
Sodium: 139 mmol/L (ref 135–145)

## 2021-12-06 LAB — GLUCOSE, CAPILLARY
Glucose-Capillary: 114 mg/dL — ABNORMAL HIGH (ref 70–99)
Glucose-Capillary: 118 mg/dL — ABNORMAL HIGH (ref 70–99)
Glucose-Capillary: 147 mg/dL — ABNORMAL HIGH (ref 70–99)

## 2021-12-06 MED ORDER — BUPROPION HCL ER (SR) 150 MG PO TB12: 150.0000 mg | ORAL_TABLET | Freq: Two times a day (BID) | ORAL | Status: DC

## 2021-12-06 MED ORDER — SUMATRIPTAN SUCCINATE 50 MG PO TABS
50.0000 mg | ORAL_TABLET | Freq: Once | ORAL | Status: DC
  Filled 2021-12-06: qty 1

## 2021-12-06 MED ORDER — UBROGEPANT 100 MG PO TABS: 100.0000 mg | ORAL_TABLET | Freq: Every day | ORAL | Status: DC | PRN

## 2021-12-06 MED ORDER — OXYCODONE HCL 5 MG PO TABS: 5.0000 mg | ORAL_TABLET | ORAL | Status: DC | PRN

## 2021-12-06 NOTE — Progress Notes (Signed)
Subjective: No changes  Exam: Vitals:   12/06/21 0600 12/06/21 0800  BP: 98/68 93/75  Pulse: 61 62  Resp: 15 (!) 25  Temp:  98.6 F (37 C)  SpO2: 100% 98%   Gen: In bed, NAD Resp: non-labored breathing, no acute distress Abd: soft, nt  Neuro: MS: Awake, alert, oriented and appropriate CN: Pupils equal round and reactive to light Motor: 5/5 throughout including the left leg Sensory: intact to LT  Pertinent Labs: Total cholesterol is 83 with HDL of 34 so LDL is clearly less than 70 even though it was not calculated A1c of 4.9  Impression: 71 year old female who presented with cute onset of disequilibrium and left leg weakness status post IV tenecteplase.  She markedly improved status post tenecteplase. I think at this point, I would consider this stroke aborted with thrombolytic therapy.  It is unclear whether this was a thrombotic or embolic event, or even possibly not an ischemic event, therefore I would not favor changing to stronger anticoagulation unless there was a clear indication for doing so.  Recommendations: 1) continue home statin 2) continue home ASA and plavix 3) f/u echo 4) unless there is a finding on echo suggestive of the need for anticoagulation, no changes to her antithrombotic therapy. 5) she should follow-up with the physician managing her implanted loop recorder, to have it interrogated. 6) she can follow-up with her regular outpatient neurologist.   Roland Rack, MD [facility] [phone]  If 7pm- 7am, please page neurology on call as listed in [place].

## 2021-12-06 NOTE — Progress Notes (Signed)
Report called to [place] on 1A. All questions answered. Patient belongings gathered together and packed by daughter at bedside. All belongings sent with patient to room 133. [place] NT transported patient to 20. Vitals WDL at time of transfer. [place] RN added to secure chat with MD Priscella Mann regarding patient migraine medication to in patient treatment.

## 2021-12-06 NOTE — Evaluation (Signed)
Occupational Therapy Evaluation Patient Details Name: KINGSTON GUILES MRN: [id number] DOB: 03/09/1950 Today's Date: 12/06/2021   History of Present Illness 71 year old female who presented with cute onset of disequilibrium and left leg weakness status post IV tenecteplase. She has a history of TIA in 02/2020 followed by a stroke in 04/2020 and endometrial cancer during which was profoundly anemic.She is post chemo and radiation treatment for endometrial cancer and recently completed radiation treatment on her Left breast.   Clinical Impression   Ms Saur was seen for OT evaluation this date. Prior to hospital admission, pt was IND for mobility and I/ADLs. Pt lives alone, daughter available PRN. Pt presents to acute OT demonstrating impaired ADL performance and functional mobility 2/2 decreased activity tolerance and functional balance deficits. Pt currently requires SUPERVISION for functional mobility, tolerates >500 ft however fatigues on RA requiring standing rest break, SpO2 86% at end of mobility. Pt reports having 2 large dogs at home, withstand pertubations with good safetya wareness and use of small stagger steps. MOD I don/doff socks in sitting and opening packages for breakfast. Educated on energy conservation strategies. all education complete, will sign off. Upon hospital discharge, recommend no OT follow up.   Recommendations for follow up therapy are one component of a multi-disciplinary discharge planning process, led by the attending physician.  Recommendations may be updated based on patient status, additional functional criteria and insurance authorization.   Follow Up Recommendations  No OT follow up    Assistance Recommended at Discharge Set up Supervision/Assistance  Patient can return home with the following Help with stairs or ramp for entrance    Functional Status Assessment  Patient has had a recent decline in their functional status and demonstrates the ability to make  significant improvements in function in a reasonable and predictable amount of time.  Equipment Recommendations  None recommended by OT    Recommendations for Other Services       Precautions / Restrictions Precautions Precautions: None Restrictions Weight Bearing Restrictions: No      Mobility Bed Mobility Overal bed mobility: Modified Independent                  Transfers Overall transfer level: Needs assistance Equipment used: None Transfers: Sit to/from Stand Sit to Stand: Supervision                  Balance Overall balance assessment: Mild deficits observed, not formally tested                                         ADL either performed or assessed with clinical judgement   ADL Overall ADL's : Needs assistance/impaired                                       General ADL Comments: SUPERVISION for functional mobility, tolerates >500 ft however fatigues on RA requiring standing rest break SpO2 86% at end of mobility. MOD I don/doff socks in sitting and opening packages for breakfast      Pertinent Vitals/Pain Pain Assessment Pain Assessment: Faces Faces Pain Scale: Hurts little more Pain Location: headache Pain Descriptors / Indicators: Headache Pain Intervention(s): Limited activity within patient's tolerance, Repositioned, Patient requesting pain meds-RN notified     Hand Dominance Right   Extremity/Trunk Assessment Upper Extremity Assessment  Upper Extremity Assessment: Overall WFL for tasks assessed   Lower Extremity Assessment Lower Extremity Assessment: Generalized weakness       Communication Communication Communication: No difficulties   Cognition Arousal/Alertness: Awake/alert Behavior During Therapy: WFL for tasks assessed/performed Overall Cognitive Status: Within Functional Limits for tasks assessed                                        Home Living Family/patient expects  to be discharged to:: Private residence Living Arrangements: Children Available Help at Discharge: Family;Available PRN/intermittently Type of Home: House Home Access: Stairs to enter [employer] of Steps: 5 Entrance Stairs-Rails: Can reach both Home Layout: Two level;Able to live on main level with bedroom/bathroom                          Prior Functioning/Environment Prior Level of Function : Independent/Modified Independent;Driving             Mobility Comments: no AD ADLs Comments: walks 2 dogs daily        OT Problem List: Impaired balance (sitting and/or standing);Decreased activity tolerance         OT Goals(Current goals can be found in the care plan section) Acute Rehab OT Goals Patient Stated Goal: to go home OT Goal Formulation: With patient Time For Goal Achievement: 12/20/21 Potential to Achieve Goals: Good   AM-PAC OT "6 Clicks" Daily Activity     Outcome Measure Help from another person eating meals?: None Help from another person taking care of personal grooming?: A Little Help from another person toileting, which includes using toliet, bedpan, or urinal?: None Help from another person bathing (including washing, rinsing, drying)?: A Little Help from another person to put on and taking off regular upper body clothing?: None Help from another person to put on and taking off regular lower body clothing?: None 6 Click Score: 22   End of Session Nurse Communication: Mobility status  Activity Tolerance: Patient tolerated treatment well Patient left: in bed;with call bell/phone within reach;with nursing/sitter in room  OT Visit Diagnosis: Other abnormalities of gait and mobility (R26.89)                Time: 5498-2641 OT Time Calculation (min): 24 min Charges:  OT General Charges $OT Visit: 1 Visit OT Evaluation $OT Eval Moderate Complexity: 1 Mod OT Treatments $Therapeutic Activity: 8-22 mins  Dessie Coma, M.S. OTR/L   12/06/21, 11:12 AM  ascom [phone]

## 2021-12-06 NOTE — Evaluation (Signed)
Physical Therapy Evaluation Patient Details Name: Natalie [name] MRN: [id number] DOB: [birth year] Today's Date: 12/06/2021  History of Present Illness  71 year old female who presented with acute onset of disequilibrium and left leg weakness; status post IV tenecteplase. She has a history of TIA in 02/2020 followed by a stroke in 04/2020 and endometrial cancer during which was profoundly anemic.  She is post chemo and radiation treatment for endometrial cancer and recently completed radiation treatment on her Left breast.  Clinical Impression  Prior to hospital admission, pt was independent with functional mobility; lives on main floor of home with 5 STE B railing; pt's daughter can assist PRN.  1/10 frontal headache during sessions activities.  Currently pt is modified independent with bed mobility; independent with transfers; and SBA ambulating 100 feet (no AD use).  Pt steady on her feet during functional mobility but does demonstrate higher level balance impairments--pt interested in OP PT.  Pt would benefit from skilled PT to address noted impairments and functional limitations (see below for any additional details).  Upon hospital discharge, pt would benefit from OP PT (MD notified).    Recommendations for follow up therapy are one component of a multi-disciplinary discharge planning process, led by the attending physician.  Recommendations may be updated based on patient status, additional functional criteria and insurance authorization.  Follow Up Recommendations Outpatient PT      Assistance Recommended at Discharge PRN  Patient can return home with the following  Assistance with cooking/housework;Assist for transportation;Help with stairs or ramp for entrance    Equipment Recommendations None recommended by PT  Recommendations for Other Services       Functional Status Assessment Patient has had a recent decline in their functional status and demonstrates the ability to make  significant improvements in function in a reasonable and predictable amount of time.     Precautions / Restrictions Precautions Precautions: None Precaution Comments: Protective Restrictions Weight Bearing Restrictions: No      Mobility  Bed Mobility Overal bed mobility: Modified Independent             General bed mobility comments: HOB elevated; no difficulties noted    Transfers Overall transfer level: Independent Equipment used: None Transfers: Sit to/from Stand Sit to Stand: Independent           General transfer comment: steady safe transfers    Ambulation/Gait Ambulation/Gait assistance: Supervision Gait Distance (Feet): 100 Feet Assistive device: None Gait Pattern/deviations: Step-through pattern Gait velocity: mildly decreased     General Gait Details: steady ambulation  Stairs            Wheelchair Mobility    Modified Rankin (Stroke Patients Only)       Balance Overall balance assessment: Needs assistance Sitting-balance support: No upper extremity supported, Feet supported Sitting balance-Leahy Scale: Normal Sitting balance - Comments: steady sitting reaching outside BOS   Standing balance support: No upper extremity supported Standing balance-Leahy Scale: Good Standing balance comment: pt hesitant to reach outside BOS d/t feeling like she would fall                             Pertinent Vitals/Pain Pain Assessment Pain Assessment: 0-10 Pain Score: 1  Pain Location: headache (frontal) Pain Descriptors / Indicators: Headache Pain Intervention(s): Limited activity within patient's tolerance, Monitored during session, Premedicated before session, Repositioned Prior to ambulation: BP 130/78 with HR 58 bpm; post ambulation BP 133/79 with HR 65 bpm  Home Living Family/patient expects to be discharged to:: Private residence Living Arrangements: Children (Lives alone but daughter comes intermittently and can stay and  assist as needed) Available Help at Discharge: Family;Available PRN/intermittently Type of Home: House Home Access: Stairs to enter Entrance Stairs-Rails: Right;Left;Can reach both Entrance Stairs-Number of Steps: 5   Home Layout: Two level;Able to live on main level with bedroom/bathroom Home Equipment: Cane - quad      Prior Function Prior Level of Function : Independent/Modified Independent;Driving             Mobility Comments: No recent falls.  Independent with ambulation. ADLs Comments: Walks 2 dogs daily     Hand Dominance        Extremity/Trunk Assessment   Upper Extremity Assessment Upper Extremity Assessment: Overall WFL for tasks assessed    Lower Extremity Assessment Lower Extremity Assessment: LLE deficits/detail (R LE WFL) LLE Deficits / Details: 4+/5 hip flexion; 5/5 knee flexion/extension, and DF AROM LLE Sensation:  (decreased light touch lateral L upper and lower leg and great toe) LLE Coordination: WNL    Cervical / Trunk Assessment Cervical / Trunk Assessment: Normal  Communication   Communication: No difficulties  Cognition Arousal/Alertness: Awake/alert Behavior During Therapy: WFL for tasks assessed/performed Overall Cognitive Status: Within Functional Limits for tasks assessed                                          General Comments  Nursing cleared pt for participation in physical therapy.  Pt agreeable to PT session.  Pt's daughter present during session.    Exercises     Assessment/Plan    PT Assessment Patient needs continued PT services  PT Problem List Decreased strength;Decreased activity tolerance;Decreased balance;Decreased mobility       PT Treatment Interventions DME instruction;Gait training;Stair training;Functional mobility training;Therapeutic activities;Therapeutic exercise;Balance training;Patient/family education    PT Goals (Current goals can be found in the Care Plan section)  Acute Rehab PT  Goals Patient Stated Goal: to go home; improve balance PT Goal Formulation: With patient Time For Goal Achievement: 12/20/21 Potential to Achieve Goals: Good    Frequency 7X/week     Co-evaluation               AM-PAC PT "6 Clicks" Mobility  Outcome Measure Help needed turning from your back to your side while in a flat bed without using bedrails?: None Help needed moving from lying on your back to sitting on the side of a flat bed without using bedrails?: None Help needed moving to and from a bed to a chair (including a wheelchair)?: A Little Help needed standing up from a chair using your arms (e.g., wheelchair or bedside chair)?: A Little Help needed to walk in hospital room?: A Little Help needed climbing 3-5 steps with a railing? : A Little 6 Click Score: 20    End of Session Equipment Utilized During Treatment: Gait belt Activity Tolerance: Patient tolerated treatment well Patient left: in bed;with call bell/phone within reach;with family/visitor present;Other (comment) (MD present) Nurse Communication: Mobility status;Precautions (Nurse cleared pt to ambulate in room on own (pt has been walking to bathroom on own)--nurse reported she would update fall risk score to reflect this) PT Visit Diagnosis: Other abnormalities of gait and mobility (R26.89)    Time: 2542-7062 PT Time Calculation (min) (ACUTE ONLY): 42 min   Charges:   PT Evaluation $PT  Eval Low Complexity: 1 Low PT Treatments $Therapeutic Activity: 8-22 mins       Leitha Bleak, PT 12/06/21, 3:49 PM

## 2021-12-06 NOTE — Telephone Encounter (Signed)
Called and left a message to call the office back to schedule her follow up in February

## 2021-12-06 NOTE — Progress Notes (Signed)
Nutrition Brief Note  Patient identified on the Malnutrition Screening Tool (MST) Report  Wt Readings from Last 15 Encounters:  12/06/21 66.7 kg  11/28/21 68.7 kg  10/02/21 70 kg  09/17/21 68.9 kg  09/10/21 70.3 kg  08/30/21 69.6 kg  08/05/21 70.4 kg  06/18/21 72.6 kg  05/23/21 70.9 kg  04/02/21 72.9 kg  03/13/21 74.8 kg  02/11/21 76.4 kg  01/31/21 76.2 kg  01/24/21 81.1 kg  01/14/21 76.1 kg   Pt admitted with acute CVA s/p TPA.   Spoke with pt and daughter at bedside. Pt reports feeling a little better today. She denies any difficulty chewing or swallowing. Per pt, her appetite typically comes and goes. Pt shares that she is very mindful and keeps track of her calorie intake- usually averages about 1600 kcals per day, but admits she has "good days and bad days" (good day consumes 1700-2000 kcals and bad day consumes between 1200-1400 kcals).   Pt reports that she experienced significant wt loss over the past few years after undergoing cancer treatments, but this has stabilized over the past year. She shares her wt unusually ranges within 6# (154-160#). She keeps Ensure supplements at her home for when she needs them, but does not drink them on a regular basis. Pt declines offer of Ensure.   Nutrition-Focused physical exam completed. Findings are no fat depletion, no muscle depletion, and no edema.    Current diet order is regular, patient is consuming approximately 100% of meals at this time. Labs and medications reviewed.   No nutrition interventions warranted at this time. If nutrition issues arise, please consult RD.   Loistine Chance, RD, LDN, [place] Registered Dietitian II Certified Diabetes Care and Education Specialist Please refer to [facility] for RD and/or RD on-call/weekend/after hours pager

## 2021-12-06 NOTE — Discharge Summary (Signed)
Physician Discharge Summary  JALESA THIEN SJG:[id number] DOB: 23-Oct-1950 DOA: 12/04/2021  PCP: [name], MD  Admit date: 12/04/2021 Discharge date: 12/06/2021  Admitted From: Home Disposition:  Home  Recommendations for Outpatient Follow-up:  Follow up with PCP in 1-2 weeks Follow up with neurology as directed Follow up with cardiology as directed  Home Health:No  Equipment/Devices:None   Discharge Condition:Stable  CODE STATUS:FULL  Diet recommendation: Heart healthy  Brief/Interim Summary: 71 yo F presenting to [facility] ED on 12/04/21 from home where she was in her normal state of health until 18:00. History provided by patient bedside with daughter's assistance. She reports sudden "dizziness" which she describes as feeling unsteady, drifting to one side, leading to an inability to walk. She denies falling, only because her daughter caught her in time. She reports some forgetfulness around following events but reports extreme fatigue, without syncope. She reports feeling as though she was breathing "heavy" and had some word-finding difficulty. She also states these symptoms felt similar to her previous TIA. She denies headache, blurred vision, nausea/vomiting, chest pain.  She has a history of TIA in 02/2020 followed by a stroke in 04/2020. She denies any significant residual symptoms after these events besides "dizzy spells". However, it is unclear if these "dizzy spells" were due to her stroke as they were occurring while she was being worked up for endometrial cancer and was profoundly anemic. She is post chemo and radiation treatment for endometrial cancer and recently completed radiation treatment on her Left breast. Both cancers were discovered at Stage I and there are no future plans for any additional treatment at this time.  Code stroke activated.  Patient received TNKase.  Neuro function returned to normal MRI neg for acute CVA.  Unclear nature of event.  ?complicated  migraine vs conversion Seen by inpatient neuro, cleared for DC.  No changes in home medications Echo reviewed by cardiology.  No indication for therapeutic anticoagulation Patient seen by PT, outpatient PT recommended.  Referral placed Stable for dc home   Discharge Diagnoses:  Active Problems:   Adult hypothyroidism   Moderate major depression ([place])   Migraine without aura and without status migrainosus, not intractable   Endometrial cancer (HCC)   Major depression, recurrent, chronic ([place])   Malignant neoplasm of left breast in female, estrogen receptor positive ([place])   Acute CVA (cerebrovascular accident) [facility])   Cerebrovascular accident (CVA) [facility])    Discharge Instructions  Discharge Instructions     Ambulatory referral to Physical Therapy   Complete by: As directed    Diet - low sodium heart healthy   Complete by: As directed    Increase activity slowly   Complete by: As directed       Allergies as of 12/06/2021       Reactions   Sulfamethoxazole-trimethoprim Hives   Tnkase [tenecteplase] Swelling   Mild focal left upper lip swelling        Medication List     STOP taking these medications    HYDROcodone-acetaminophen 5-325 MG tablet Commonly known as: NORCO/VICODIN       TAKE these medications    alendronate 70 MG tablet Commonly known as: Fosamax Take 1 tablet (70 mg total) by mouth once a week. Take with a full glass of water on an empty stomach.   aspirin EC 81 MG tablet Take by mouth.   aspirin-acetaminophen-caffeine 654-650-35 MG tablet Commonly known as: EXCEDRIN MIGRAINE Take by mouth every 6 (six) hours as needed for headache or migraine.  atorvastatin 40 MG tablet Commonly known as: LIPITOR Take 1 tablet (40 mg total) by mouth daily.   buPROPion 150 MG 12 hr tablet Commonly known as: WELLBUTRIN SR Take 1 tablet (150 mg total) by mouth 2 (two) times daily.   citalopram 40 MG tablet Commonly known as: CELEXA TAKE 1 TABLET BY  MOUTH EVERY DAY   clopidogrel 75 MG tablet Commonly known as: PLAVIX Take 1 tablet (75 mg total) by mouth daily.   levothyroxine 75 MCG tablet Commonly known as: SYNTHROID TAKE 1 TABLET(75 MCG) BY MOUTH DAILY BEFORE BREAKFAST   multivitamin with minerals Tabs tablet Take 1 tablet by mouth daily.   polyvinyl alcohol 1.4 % ophthalmic solution Commonly known as: LIQUIFILM TEARS Place 1 drop into both eyes daily as needed for dry eyes.   Ubrelvy 100 MG Tabs Generic drug: Ubrogepant Take 100 mg by mouth daily as needed (migraines).   Vitamin B-12 1000 MCG Subl Place 1 tablet (1,000 mcg total) under the tongue 2 (two) times a week.   Vitamin D (Ergocalciferol) 1.25 MG (50000 UNIT) Caps capsule Commonly known as: DRISDOL Take 1 capsule (50,000 Units total) by mouth every 14 (fourteen) days.        Follow-up Information     [name], MD. Schedule an appointment as soon as possible for a visit in 1 week(s).   Specialty: Family Medicine Contact information: [address] [address] [phone]         Vladimir Crofts, MD Follow up.   Specialty: Neurology Why: Follow up as directed Contact information: [facility] [place] [address] [phone]         Yolonda Kida, MD Follow up.   Specialties: Cardiology, Internal Medicine Why: Follow up as directed Contact information: [place] [address] [phone]                Allergies  Allergen Reactions   Sulfamethoxazole-Trimethoprim Hives   Tnkase [Tenecteplase] Swelling    Mild focal left upper lip swelling    Consultations: Neurology   Procedures/Studies: ECHOCARDIOGRAM COMPLETE  Result Date: 12/06/2021    ECHOCARDIOGRAM REPORT   Patient Name:   [name] Date of Exam: 12/05/2021 Medical Rec #:  [id number]          Height:       65.0 in Accession #:    [id number]         Weight:        147.7 lb Date of Birth:  08/21/1950           BSA:          1.739 m Patient Age:    68 years           BP:           103/64 mmHg Patient Gender: F                  HR:           69 bpm. Exam Location:  [facility] Procedure: 2D Echo, Cardiac Doppler and Color Doppler Indications:     Stroke I63.9  History:         Patient has prior history of Echocardiogram examinations, most                  recent 04/23/2020. Angina, palpitations.  Sonographer:     Sherrie Sport Referring Phys:  [id number] [place] Diagnosing Phys: Karma Greaser  Prince Rome MD IMPRESSIONS  1. Left ventricular ejection fraction, by estimation, is 55 to 60%. Left ventricular ejection fraction by PLAX is 60 %. The left ventricle has normal function. The left ventricle has no regional wall motion abnormalities. Left ventricular diastolic parameters are consistent with Grade I diastolic dysfunction (impaired relaxation).  2. Right ventricular systolic function is normal. The right ventricular size is normal.  3. The mitral valve is normal in structure. No evidence of mitral valve regurgitation.  4. The aortic valve is normal in structure. Aortic valve regurgitation is not visualized. FINDINGS  Left Ventricle: Left ventricular ejection fraction, by estimation, is 55 to 60%. Left ventricular ejection fraction by PLAX is 60 %. The left ventricle has normal function. The left ventricle has no regional wall motion abnormalities. The left ventricular internal cavity size was normal in size. There is no left ventricular hypertrophy. Left ventricular diastolic parameters are consistent with Grade I diastolic dysfunction (impaired relaxation). Right Ventricle: The right ventricular size is normal. No increase in right ventricular wall thickness. Right ventricular systolic function is normal. Left Atrium: Left atrial size was normal in size. Right Atrium: Right atrial size was normal in size. Pericardium: There is no evidence of pericardial effusion. Mitral Valve: The  mitral valve is normal in structure. No evidence of mitral valve regurgitation. Tricuspid Valve: The tricuspid valve is normal in structure. Tricuspid valve regurgitation is not demonstrated. Aortic Valve: The aortic valve is normal in structure. Aortic valve regurgitation is not visualized. Aortic valve mean gradient measures 4.0 mmHg. Aortic valve peak gradient measures 8.0 mmHg. Aortic valve area, by VTI measures 2.67 cm. Pulmonic Valve: The pulmonic valve was normal in structure. Pulmonic valve regurgitation is not visualized. Aorta: The ascending aorta was not well visualized. IAS/Shunts: No atrial level shunt detected by color flow Doppler.  LEFT VENTRICLE PLAX 2D LV EF:         Left            Diastology                ventricular     LV e' medial:    7.07 cm/s                ejection        LV E/e' medial:  10.6                fraction by     LV e' lateral:   9.90 cm/s                PLAX is 60      LV E/e' lateral: 7.6                %. LVIDd:         3.80 cm LVIDs:         2.60 cm LV PW:         0.90 cm LV IVS:        1.10 cm LVOT diam:     2.10 cm LV SV:         81 LV SV Index:   47 LVOT Area:     3.46 cm  RIGHT VENTRICLE RV Basal diam:  3.10 cm RV Mid diam:    2.70 cm RV S prime:     16.60 cm/s TAPSE (M-mode): 3.0 cm LEFT ATRIUM             Index        RIGHT ATRIUM  Index LA diam:        3.50 cm 2.01 cm/m   RA Area:     18.00 cm LA Vol (A2C):   25.8 ml 14.84 ml/m  RA Volume:   43.40 ml  24.96 ml/m LA Vol (A4C):   37.1 ml 21.33 ml/m LA Biplane Vol: 30.7 ml 17.65 ml/m  AORTIC VALVE AV Area (Vmax):    2.60 cm AV Area (Vmean):   2.55 cm AV Area (VTI):     2.67 cm AV Vmax:           141.00 cm/s AV Vmean:          94.600 cm/s AV VTI:            0.305 m AV Peak Grad:      8.0 mmHg AV Mean Grad:      4.0 mmHg LVOT Vmax:         106.00 cm/s LVOT Vmean:        69.700 cm/s LVOT VTI:          0.235 m LVOT/AV VTI ratio: 0.77  AORTA Ao Root diam: 3.50 cm MITRAL VALVE               TRICUSPID VALVE  MV Area (PHT): 3.39 cm    TR Peak grad:   9.1 mmHg MV Decel Time: 224 msec    TR Vmax:        151.00 cm/s MV E velocity: 74.90 cm/s MV A velocity: 87.00 cm/s  SHUNTS MV E/A ratio:  0.86        Systemic VTI:  0.24 m                            Systemic Diam: 2.10 cm Yolonda Kida MD Electronically signed by Yolonda Kida MD Signature Date/Time: 12/06/2021/6:10:28 PM    Final    MR BRAIN WO CONTRAST  Result Date: 12/05/2021 CLINICAL DATA:  Stroke, follow-up. EXAM: MRI HEAD WITHOUT CONTRAST TECHNIQUE: Multiplanar, multiecho pulse sequences of the brain and surrounding structures were obtained without intravenous contrast. COMPARISON:  Head CT December 04, 2021. FINDINGS: Brain: No acute infarction, hemorrhage, hydrocephalus, extra-axial collection or mass lesion. Small amount of scattered foci of T2 hyperintensity are seen within the white matter of the cerebral hemispheres and within the pons, nonspecific, most likely related to chronic small vessel ischemia. Remote cortical infarcts the left parietal and occipital lobes. Tiny remote infarct in the left thalamus. Vascular: Normal flow voids. Skull and upper cervical spine: Normal marrow signal. Sinuses/Orbits: Small left mastoid effusion. Paranasal sinuses are clear. Bilateral lens surgery. Other: None. IMPRESSION: 1. No acute intracranial abnormality. 2. Mild chronic microvascular ischemic changes of the white matter. 3. Remote infarcts in the left parietal and occipital lobes and left thalamus. Electronically Signed   By: Pedro Earls M.D.   On: 12/05/2021 14:55   CT ANGIO HEAD NECK W WO CM (CODE STROKE)  Result Date: 12/04/2021 CLINICAL DATA:  Initial evaluation for neuro deficit, stroke suspected, left lower extremity weakness. EXAM: CT ANGIOGRAPHY HEAD AND NECK TECHNIQUE: Multidetector CT imaging of the head and neck was performed using the standard protocol during bolus administration of intravenous contrast. Multiplanar CT image  reconstructions and MIPs were obtained to evaluate the vascular anatomy. Carotid stenosis measurements (when applicable) are obtained utilizing NASCET criteria, using the distal internal carotid diameter as the denominator. RADIATION DOSE REDUCTION: This exam was performed according to the  departmental dose-optimization program which includes automated exposure control, adjustment of the mA and/or kV according to patient size and/or use of iterative reconstruction technique. CONTRAST:  10m OMNIPAQUE IOHEXOL 350 MG/ML SOLN COMPARISON:  Head CT from earlier the same day. FINDINGS: CTA NECK FINDINGS Aortic arch: Visualized aortic arch normal caliber with normal branch pattern. No stenosis about the origin of the great vessels. Right carotid system: Right common and internal carotid arteries widely patent without stenosis, dissection or occlusion. Left carotid system: Left common and internal carotid arteries widely patent without stenosis, dissection or occlusion. Vertebral arteries: Both vertebral arteries arise from the subclavian arteries. No proximal subclavian artery stenosis. Right vertebral artery dominant. Vertebral arteries patent without stenosis or dissection. Skeleton: Mild-to-moderate spondylosis noted at C3-4 through C6-7. Benign hemangioma noted at T2. No worrisome osseous lesions. Other neck: No other acute soft tissue abnormality within the neck. Upper chest: Visualized upper chest demonstrates no acute finding. Review of the MIP images confirms the above findings CTA HEAD FINDINGS Anterior circulation: Both internal carotid arteries widely patent to the termini without stenosis. A1 segments widely patent. Normal anterior communicating artery complex. Both anterior cerebral arteries widely patent to their distal aspects without stenosis. No M1 stenosis or occlusion. Normal MCA bifurcations. Distal MCA branches well perfused and symmetric. Posterior circulation: Both V4 segments patent to the  vertebrobasilar junction without stenosis. Both PICA patent. Basilar patent to its distal aspect without stenosis. Superior cerebral arteries patent bilaterally. Right PCA primarily supplied via the basilar. Predominant fetal type origin of the left PCA. Both PCAs widely patent to their distal aspects. Venous sinuses: Patent allowing for timing the contrast bolus. Anatomic variants: As above.  No aneurysm. Review of the MIP images confirms the above findings IMPRESSION: Negative CTA of the head and neck. No large vessel occlusion or other emergent finding. No hemodynamically significant or correctable stenosis. Electronically Signed   By: BJeannine BogaM.D.   On: 12/04/2021 20:12   CT HEAD CODE STROKE WO CONTRAST  Result Date: 12/04/2021 CLINICAL DATA:  Code stroke.  Acute neuro deficit.  Rule out stroke. EXAM: CT HEAD WITHOUT CONTRAST TECHNIQUE: Contiguous axial images were obtained from the base of the skull through the vertex without intravenous contrast. RADIATION DOSE REDUCTION: This exam was performed according to the departmental dose-optimization program which includes automated exposure control, adjustment of the mA and/or kV according to patient size and/or use of iterative reconstruction technique. COMPARISON:  CT head 08/05/2020 FINDINGS: Brain: No evidence of acute infarction, hemorrhage, hydrocephalus, extra-axial collection or mass lesion/mass effect. Vascular: Negative for hyperdense vessel Skull: Negative Sinuses/Orbits: Paranasal sinuses clear. Bilateral cataract extraction Other: None ASPECTS ([place] Early CT Score) - Ganglionic level infarction (caudate, lentiform nuclei, internal capsule, insula, M1-M3 cortex): 7 - Supraganglionic infarction (M4-M6 cortex): 3 Total score (0-10 with 10 being normal): 10 IMPRESSION: 1. Negative CT head. 2. Aspects is 10. 3. These results were called by telephone at the time of interpretation on 12/04/2021 at 6:45 pm to provider KCorky Downs  who verbally acknowledged these results. Electronically Signed   By: CFranchot GalloM.D.   On: 12/04/2021 18:46      Subjective: Seen and examined.  No acute distress.  Neurologic function at baseline.  Stable for dc home.  Discharge Exam: Vitals:   12/06/21 1306 12/06/21 1624  BP: 112/63 106/62  Pulse: 63 65  Resp: 18 16  Temp: 97.6 F (36.4 C) (!) 97.4 F (36.3 C)  SpO2: 99% 99%   Vitals:  12/06/21 1000 12/06/21 1100 12/06/21 1306 12/06/21 1624  BP: 129/70 122/80 112/63 106/62  Pulse: (!) 58 76 63 65  Resp: '13 16 18 16  '$ Temp:  98.3 F (36.8 C) 97.6 F (36.4 C) (!) 97.4 F (36.3 C)  TempSrc:  Oral    SpO2: 100% 96% 99% 99%  Weight:      Height:        General: Pt is alert, awake, not in acute distress Cardiovascular: RRR, S1/S2 +, no rubs, no gallops Respiratory: CTA bilaterally, no wheezing, no rhonchi Abdominal: Soft, NT, ND, bowel sounds + Extremities: no edema, no cyanosis    The results of significant diagnostics from this hospitalization (including imaging, microbiology, ancillary and laboratory) are listed below for reference.     Microbiology: Recent Results (from the past 240 hour(s))  MRSA Next Gen by PCR, Nasal     Status: None   Collection Time: 12/05/21  3:44 AM   Specimen: Nasal Mucosa; Nasal Swab  Result Value Ref Range Status   MRSA by PCR Next Gen NOT DETECTED NOT DETECTED Final    Comment: (NOTE) The GeneXpert MRSA Assay (FDA approved for NASAL specimens only), is one component of a comprehensive MRSA colonization surveillance program. It is not intended to diagnose MRSA infection nor to guide or monitor treatment for MRSA infections. Test performance is not FDA approved in patients less than 20 years old. Performed at [facility], [address] [place],  [address]      Labs: BNP (last 3 results) No results for input(s): "BNP" in the last 8760 hours. Basic Metabolic Panel: Recent Labs  Lab 12/04/21 1850  12/05/21 0501 12/06/21 0453  NA 134* 138 139  K 3.7 4.4 3.6  CL 102 107 107  CO2 '23 25 26  '$ GLUCOSE 86 145* 119*  BUN '18 17 23  '$ CREATININE 0.86 0.81 0.72  CALCIUM 8.7* 8.1* 8.0*  MG  --  2.2  --   PHOS  --  3.2  --    Liver Function Tests: Recent Labs  Lab 12/04/21 1850  AST 25  ALT 22  ALKPHOS 61  BILITOT 1.0  PROT 6.8  ALBUMIN 4.0   No results for input(s): "LIPASE", "AMYLASE" in the last 168 hours. No results for input(s): "AMMONIA" in the last 168 hours. CBC: Recent Labs  Lab 12/04/21 1850 12/05/21 0501  WBC 5.1 2.1*  NEUTROABS 3.3  --   HGB 12.8 11.7*  HCT 38.8 35.2*  MCV 95.8 95.4  PLT 180 156   Cardiac Enzymes: No results for input(s): "CKTOTAL", "CKMB", "CKMBINDEX", "TROPONINI" in the last 168 hours. BNP: Invalid input(s): "POCBNP" CBG: Recent Labs  Lab 12/05/21 1645 12/05/21 1922 12/06/21 0003 12/06/21 0419 12/06/21 0736  GLUCAP 108* 168* 147* 118* 114*   D-Dimer No results for input(s): "DDIMER" in the last 72 hours. Hgb A1c Recent Labs    12/04/21 1850  HGBA1C 4.9   Lipid Profile Recent Labs    12/05/21 0501  CHOL 83  HDL 34*  LDLCALC NOT CALCULATED  TRIG <10  CHOLHDL 2.4   Thyroid function studies No results for input(s): "TSH", "T4TOTAL", "T3FREE", "THYROIDAB" in the last 72 hours.  Invalid input(s): "FREET3" Anemia work up No results for input(s): "VITAMINB12", "FOLATE", "FERRITIN", "TIBC", "IRON", "RETICCTPCT" in the last 72 hours. Urinalysis    Component Value Date/Time   COLORURINE AMBER (A) 12/10/2020 1622   APPEARANCEUR CLOUDY (A) 12/10/2020 1622   LABSPEC 1.014 12/10/2020 1622   PHURINE 6.0 12/10/2020 1622  GLUCOSEU NEGATIVE 12/10/2020 1622   HGBUR MODERATE (A) 12/10/2020 1622   BILIRUBINUR NEGATIVE 12/10/2020 1622   KETONESUR NEGATIVE 12/10/2020 1622   PROTEINUR 100 (A) 12/10/2020 1622   NITRITE NEGATIVE 12/10/2020 1622   LEUKOCYTESUR TRACE (A) 12/10/2020 1622   Sepsis Labs Recent Labs  Lab  12/04/21 1850 12/05/21 0501  WBC 5.1 2.1*   Microbiology Recent Results (from the past 240 hour(s))  MRSA Next Gen by PCR, Nasal     Status: None   Collection Time: 12/05/21  3:44 AM   Specimen: Nasal Mucosa; Nasal Swab  Result Value Ref Range Status   MRSA by PCR Next Gen NOT DETECTED NOT DETECTED Final    Comment: (NOTE) The GeneXpert MRSA Assay (FDA approved for NASAL specimens only), is one component of a comprehensive MRSA colonization surveillance program. It is not intended to diagnose MRSA infection nor to guide or monitor treatment for MRSA infections. Test performance is not FDA approved in patients less than 53 years old. Performed at [facility], [address], [place], [place] [address]      Time coordinating discharge: Over 30 minutes  SIGNED:   Sidney Ace, MD  [facility] 12/06/2021, 6:27 PM Pager   If 7PM-7AM, please contact night-coverage

## 2021-12-07 ENCOUNTER — Encounter: Payer: Self-pay | Admitting: Family Medicine

## 2021-12-09 ENCOUNTER — Telehealth: Payer: Self-pay

## 2021-12-09 NOTE — Telephone Encounter (Signed)
Transition Care Management Follow-up Telephone Call Date of discharge and from where: [place] 12/06/2021 How have you been since you were released from the hospital? better Any questions or concerns? No  Items Reviewed: Did the pt receive and understand the discharge instructions provided? Yes  Medications obtained and verified? Yes  Other? No  Any new allergies since your discharge? No  Dietary orders reviewed? Yes Do you have support at home? Yes   Home Care and Equipment/Supplies: Were home health services ordered? no If so, what is the name of the agency? N/A  Has the agency set up a time to come to the patient's home? no Were any new equipment or medical supplies ordered?  No What is the name of the medical supply agency? N/A Were you able to get the supplies/equipment? not applicable Do you have any questions related to the use of the equipment or supplies? No  Functional Questionnaire: (I = Independent and D = Dependent) ADLs: I  Bathing/Dressing- I  Meal Prep- I  Eating- I  Maintaining continence- I  Transferring/Ambulation- I  Managing Meds- I  Follow up appointments reviewed:  PCP Hospital f/u appt confirmed? No  Patient to call back to schedule Specialist Hospital f/u appt confirmed? Yes  Scheduled to see neurololgy Are transportation arrangements needed? No  If their condition worsens, is the pt aware to call PCP or go to the Emergency Dept.? Yes Was the patient provided with contact information for the PCP's office or ED? Yes Was to pt encouraged to call back with questions or concerns? Yes

## 2021-12-10 NOTE — Progress Notes (Unsigned)
Name: Natalie [name]   MRN: [id number]    DOB: May 27, 1950   Date:12/11/2021       Progress Note  [facility] Follow Up  HPI  Admitted: 12/04/21 Discharged: 12/06/21  Discharge Diagnosis: complicated migraine versus conversion  She has a history of TIA 02/2020 and stroke 04/2020, she felt dizzy and started drifting to one side , almost fell but daughter caught her on 12/04/2021 , symptoms started around 18:00  Reviewed labs and studies. Abnormal findings includes leucopenia and anemia ( under the care of hematologist) low calcium  MRI : no acute abnormalities CTAngio head and neck: negative   Medication reconciliation was done   Patient was given TNKase since code stroke was activated and was sent home with home PT and same medications   Transition of care call was done on 11/06   Explained she likely had another TIA, she has a follow up with neurologist Nov 28 th 2023  We will change PT  at home since she has not been driving due to symptoms of TIA   Patient Active Problem List   Diagnosis Date Noted   Cerebrovascular accident (CVA) ([place]) 12/05/2021   Acute CVA (cerebrovascular accident) ([place]) 12/04/2021   Trigger finger, right ring finger 09/10/2021   Dupuytren's contracture of both hands 09/10/2021   Vitamin D deficiency 09/10/2021   Malignant neoplasm of left breast in female, estrogen receptor positive ([place]) 09/01/2021   Dyslipidemia 05/16/2021   Major depression, recurrent, chronic ([place]) 03/13/2021   Mild protein-calorie malnutrition ([place]) 03/13/2021   Endometrial cancer ([place]) 12/26/2020   Postoperative anemia due to acute blood loss 12/19/2020   History of CVA in adulthood 04/19/2020   History of ischemic multifocal posterior circulation stroke 02/29/2020   Difficulty hearing 06/28/2015   Arthritis, degenerative 06/28/2015   Paresthesia 06/28/2015   Purpura, nonthrombopenic ([place]) 06/28/2015   Adult hypothyroidism 08/09/2014   Migraine  without aura and without status migrainosus, not intractable 09/19/2008   Moderate major depression ([place]) 09/07/2006   OP (osteoporosis) 09/07/2006    Past Surgical History:  Procedure Laterality Date   ABDOMINAL HYSTERECTOMY  12/18/2020   ABLATION  2006   AXILLARY SENTINEL NODE BIOPSY Left 09/25/2021   Procedure: AXILLARY SENTINEL NODE BIOPSY;  Surgeon: Robert Bellow, MD;  Location: [facility];  Service: General;  Laterality: Left;   BREAST BIOPSY Left 08/21/2021   Stereo Bx, X-clip; path --> IMC (G1, ER/PR +, Her2/neu -)   BREAST LUMPECTOMY WITH NEEDLE LOCALIZATION Left 09/25/2021   Procedure: BREAST LUMPECTOMY WITH NEEDLE LOCALIZATION;  Surgeon: Robert Bellow, MD;  Location: [facility];  Service: General;  Laterality: Left;   BUBBLE STUDY  04/23/2020   Procedure: BUBBLE STUDY;  Surgeon: Larey Dresser, MD;  Location: [place];  Service: Cardiovascular;;   CATARACT EXTRACTION W/ INTRAOCULAR LENS IMPLANT Bilateral    COLONOSCOPY     COLONOSCOPY WITH PROPOFOL N/A 12/22/2018   Procedure: COLONOSCOPY WITH PROPOFOL;  Surgeon: Virgel Manifold, MD;  Location: [facility];  Service: Endoscopy;  Laterality: N/A;   EYE SURGERY  Spring 2018   Cataracts both eyes   HYSTEROSCOPY WITH D & C N/A 11/01/2020   Procedure: DILATATION AND CURETTAGE /HYSTEROSCOPY WITH MYOSURE;  Surgeon: Lafonda Mosses, MD;  Location: [facility];  Service: Gynecology;  Laterality: N/A;   LAPAROTOMY  12/18/2020   Procedure: LAPAROTOMY;  Surgeon: Lafonda Mosses, MD;  Location: [facility];  Service: Gynecology;;   LOOP RECORDER INSERTION N/A 04/23/2020  Procedure: LOOP RECORDER INSERTION;  Surgeon: Evans Lance, MD;  Location: [facility];  Service: Cardiovascular;  Laterality: N/A;   ROBOTIC ASSISTED TOTAL HYSTERECTOMY WITH BILATERAL SALPINGO OOPHERECTOMY Bilateral 12/18/2020   Procedure: XI ROBOTIC ASSISTED TOTAL HYSTERECTOMY WITH BILATERAL SALPINGO OOPHORECTOMY;  Surgeon: Lafonda Mosses,  MD;  Location: [facility];  Service: Gynecology;  Laterality: Bilateral;   SENTINEL NODE BIOPSY N/A 12/18/2020   Procedure: SENTINEL NODE BIOPSY;  Surgeon: Lafonda Mosses, MD;  Location: [facility];  Service: Gynecology;  Laterality: N/A;   TEE WITHOUT CARDIOVERSION N/A 04/23/2020   Procedure: TRANSESOPHAGEAL ECHOCARDIOGRAM (TEE);  Surgeon: Larey Dresser, MD;  Location[facility];  Service: Cardiovascular;  Laterality: N/A;   TONSILLECTOMY      Family History  Problem Relation Age of Onset   Early death Father        suicide   Depression Father    Diabetes Father    Cancer Father        prostate   Hearing loss Father        due to war   Alzheimer's disease Mother    Heart disease Brother    Atrial fibrillation Brother    Heart disease Maternal Aunt    Dementia Maternal Aunt    Heart disease Maternal Uncle    Dementia Maternal Grandmother    Heart disease Brother    Atrial fibrillation Brother    Colon cancer Neg Hx    Breast cancer Neg Hx    Ovarian cancer Neg Hx    Pancreatic cancer Neg Hx    Endometrial cancer Neg Hx     Social History   Tobacco Use   Smoking status: Former    Packs/day: 0.50    Years: 6.00    Total pack years: 3.00    Types: Cigarettes    Quit date: 02/11/1974    Years since quitting: 47.8   Smokeless tobacco: Never  Substance Use Topics   Alcohol use: Not Currently     Current Outpatient Medications:    alendronate (FOSAMAX) 70 MG tablet, Take 1 tablet (70 mg total) by mouth once a week. Take with a full glass of water on an empty stomach., Disp: 4 tablet, Rfl: 5   aspirin 81 MG EC tablet, Take by mouth., Disp: , Rfl:    aspirin-acetaminophen-caffeine (EXCEDRIN MIGRAINE) 250-250-65 MG tablet, Take by mouth every 6 (six) hours as needed for headache or migraine., Disp: , Rfl:    atorvastatin (LIPITOR) 40 MG tablet, Take 1 tablet (40 mg total) by mouth daily., Disp: 90 tablet, Rfl: 1   buPROPion (WELLBUTRIN SR) 150 MG 12 hr tablet, Take 1  tablet (150 mg total) by mouth 2 (two) times daily., Disp: , Rfl:    citalopram (CELEXA) 40 MG tablet, TAKE 1 TABLET BY MOUTH EVERY DAY, Disp: 90 tablet, Rfl: 1   clopidogrel (PLAVIX) 75 MG tablet, Take 1 tablet (75 mg total) by mouth daily., Disp: 1 tablet, Rfl: 0   Cyanocobalamin (VITAMIN B-12) 1000 MCG SUBL, Place 1 tablet (1,000 mcg total) under the tongue 2 (two) times a week., Disp: 30 tablet, Rfl: 0   levothyroxine (SYNTHROID) 75 MCG tablet, TAKE 1 TABLET(75 MCG) BY MOUTH DAILY BEFORE BREAKFAST, Disp: 90 tablet, Rfl: 1   Multiple Vitamin (MULTIVITAMIN WITH MINERALS) TABS tablet, Take 1 tablet by mouth daily., Disp: , Rfl:    polyvinyl alcohol (LIQUIFILM TEARS) 1.4 % ophthalmic solution, Place 1 drop into both eyes daily as needed for dry eyes.,  Disp: , Rfl:    Ubrogepant (UBRELVY) 100 MG TABS, Take 100 mg by mouth daily as needed (migraines)., Disp: , Rfl:    Vitamin D, Ergocalciferol, (DRISDOL) 1.25 MG (50000 UNIT) CAPS capsule, Take 1 capsule (50,000 Units total) by mouth every 14 (fourteen) days., Disp: 6 capsule, Rfl: 1  Allergies  Allergen Reactions   Sulfamethoxazole-Trimethoprim Hives   Tnkase [Tenecteplase] Swelling    Mild focal left upper lip swelling    I personally reviewed active problem list, medication list, allergies, family history, social history, health maintenance with the patient/caregiver today.   ROS  Ten systems reviewed and is negative except as mentioned in HPI   Objective  Vitals:   12/11/21 1127  BP: 126/68  Pulse: 77  Resp: 16  SpO2: 99%  Weight: 149 lb (67.6 kg)  Height: _0  (1.651 m)    Body mass index is 24.79 kg/m.  Physical Exam  Constitutional: Patient appears well-developed and well-nourished. No distress.  HEENT: head atraumatic, normocephalic, pupils equal and reactive to light, ears normal TM, neck supple, throat within normal limits Cardiovascular: Normal rate, regular rhythm and normal heart sounds.  No murmur heard. No BLE  edema. Pulmonary/Chest: Effort normal and breath sounds normal. No respiratory distress. Abdominal: Soft.  There is no tenderness. Neurologist: no focal deficits Skin: more bruises including forehead , but not due to fall, likely from [facility]  Psychiatric: Patient has a normal mood and affect. behavior is normal. Judgment and thought content normal.    PHQ2/9:    12/11/2021   11:27 AM 09/10/2021   10:56 AM 06/18/2021   10:22 AM 04/02/2021    2:25 PM 03/13/2021   10:11 AM  Depression screen PHQ 2/9  Decreased Interest 0 0 0 0 0  Down, Depressed, Hopeless 0 0 0 0 0  PHQ - 2 Score 0 0 0 0 0  Altered sleeping 0 0 1 0 0  Tired, decreased energy 0 0 1 1 0  Change in appetite 0 0 1 1 0  Feeling bad or failure about yourself  0 0 0 0 0  Trouble concentrating 0 0 1 0 0  Moving slowly or fidgety/restless 0 0 0 0 0  Suicidal thoughts 0 0 0 0 0  PHQ-9 Score 0 0 4 2 0  Difficult doing work/chores   Not difficult at all      phq 9 is negative   Fall Risk:    12/11/2021   11:26 AM 09/10/2021   10:56 AM 06/18/2021   10:22 AM 04/02/2021    2:30 PM 03/13/2021   10:11 AM  Fall Risk   Falls in the past year? 0 0 _1 Number falls in past yr: 0 0 0 1 1  Injury with Fall? 0 0 _2 Risk for fall due to : No Fall Risks No Fall Risks History of fall(s) No Fall Risks No Fall Risks  Follow up Falls prevention discussed Falls prevention discussed Falls prevention discussed;Education provided;Falls evaluation completed Falls prevention discussed Falls prevention discussed     Functional Status Survey: Is the patient deaf or have difficulty hearing?: Yes Does the patient have difficulty seeing, even when wearing glasses/contacts?: No Does the patient have difficulty concentrating, remembering, or making decisions?: Yes Does the patient have difficulty walking or climbing stairs?: No Does the patient have difficulty dressing or bathing?: No Does the patient have difficulty doing errands alone such as  visiting a doctor's office or shopping?: No  Assessment & Plan   1. Hospital discharge follow-up  - Ambulatory referral to [facility]  2. History of CVA (cerebrovascular accident)  On plavix, statin therapy   3. TIA on medication   Continue medication , keep follow up with neurologist this month

## 2021-12-10 NOTE — Telephone Encounter (Signed)
Spoke with the patient and scheduled a follow up appt

## 2021-12-11 ENCOUNTER — Encounter: Payer: Self-pay | Admitting: Family Medicine

## 2021-12-11 ENCOUNTER — Ambulatory Visit (INDEPENDENT_AMBULATORY_CARE_PROVIDER_SITE_OTHER): Payer: Medicare Other | Admitting: Family Medicine

## 2021-12-11 VITALS — BP 126/68 | HR 77 | Resp 16 | Ht 65.0 in | Wt 149.0 lb

## 2021-12-11 DIAGNOSIS — Z8673 Personal history of transient ischemic attack (TIA), and cerebral infarction without residual deficits: Secondary | ICD-10-CM | POA: Diagnosis not present

## 2021-12-11 DIAGNOSIS — Z09 Encounter for follow-up examination after completed treatment for conditions other than malignant neoplasm: Secondary | ICD-10-CM | POA: Diagnosis not present

## 2021-12-11 DIAGNOSIS — G459 Transient cerebral ischemic attack, unspecified: Secondary | ICD-10-CM | POA: Diagnosis not present

## 2021-12-30 ENCOUNTER — Encounter: Payer: Self-pay | Admitting: Radiation Oncology

## 2021-12-30 ENCOUNTER — Ambulatory Visit
Admission: RE | Admit: 2021-12-30 | Discharge: 2021-12-30 | Disposition: A | Discharge status: Home / Self Care | Payer: Medicare Other | Source: Ambulatory Visit | Attending: Radiation Oncology | Admitting: Radiation Oncology

## 2021-12-30 VITALS — BP 130/89 | HR 66 | Temp 97.4°F | Resp 16 | Ht 65.0 in | Wt 148.4 lb

## 2021-12-30 DIAGNOSIS — Z170 Estrogen receptor positive status [ER+]: Secondary | ICD-10-CM | POA: Diagnosis not present

## 2021-12-30 DIAGNOSIS — C50912 Malignant neoplasm of unspecified site of left female breast: Secondary | ICD-10-CM | POA: Diagnosis not present

## 2021-12-30 DIAGNOSIS — Z8673 Personal history of transient ischemic attack (TIA), and cerebral infarction without residual deficits: Secondary | ICD-10-CM | POA: Insufficient documentation

## 2021-12-30 DIAGNOSIS — Z923 Personal history of irradiation: Secondary | ICD-10-CM | POA: Diagnosis not present

## 2021-12-30 NOTE — Progress Notes (Signed)
Radiation Oncology Follow up Note  Name: Natalie [name]   Date:   12/30/2021 MRN:  [id number] DOB: 1950/07/06    This 71 y.o. female presents to the clinic today for 1 month follow-up status post whole breast radiation to her left breast for stage Ia invasive mammary carcinoma ER/PR positive.  REFERRING PROVIDER: [name], MD  HPI: Patient is a 71 year old female now at 1 month having completed whole breast radiation to her left breast for invasive mammary carcinoma tubular type ER/PR positive the left breast.  In the interim since completing radiation she has had a CVA which she has been appropriately treated for with residual side effects.  She specifically denies breast tenderness cough or bone pain.  She has not yet started endocrine therapy.  COMPLICATIONS OF TREATMENT: none  FOLLOW UP COMPLIANCE: keeps appointments   PHYSICAL EXAM:  BP 130/89 (BP Location: Left Arm, Patient Position: Sitting, Cuff Size: Normal)   Pulse 66   Temp (!) 97.4 F (36.3 C) (Tympanic)   Resp 16   Ht '5\' 5"'$  (1.651 m)   Wt 148 lb 6.4 oz (67.3 kg)   BMI 24.70 kg/m  Lungs are clear to A&P cardiac examination essentially unremarkable with regular rate and rhythm. No dominant mass or nodularity is noted in either breast in 2 positions examined. Incision is well-healed. No axillary or supraclavicular adenopathy is appreciated. Cosmetic result is excellent.  Well-developed well-nourished patient in NAD. HEENT reveals PERLA, EOMI, discs not visualized.  Oral cavity is clear. No oral mucosal lesions are identified. Neck is clear without evidence of cervical or supraclavicular adenopathy. Lungs are clear to A&P. Cardiac examination is essentially unremarkable with regular rate and rhythm without murmur rub or thrill. Abdomen is benign with no organomegaly or masses noted. Motor sensory and DTR levels are equal and symmetric in the upper and lower extremities. Cranial nerves II through XII are grossly intact.  Proprioception is intact. No peripheral adenopathy or edema is identified. No motor or sensory levels are noted. Crude visual fields are within normal range.  RADIOLOGY RESULTS: No current films for review  PLAN: Present time patient is doing well from a breast standpoint.  She is seeing medical oncology for discussion of endocrine therapy.  I have asked to see her back in 6 months for follow-up.  She also I have suggested return visit with her cardiologist for consideration of reasons why she continues to develop CVAs.  I would like to take this opportunity to thank you for allowing me to participate in the care of your patient.Noreene Filbert, MD

## 2021-12-31 DIAGNOSIS — G4733 Obstructive sleep apnea (adult) (pediatric): Secondary | ICD-10-CM | POA: Diagnosis not present

## 2021-12-31 DIAGNOSIS — G3184 Mild cognitive impairment, so stated: Secondary | ICD-10-CM | POA: Diagnosis not present

## 2021-12-31 DIAGNOSIS — R0683 Snoring: Secondary | ICD-10-CM | POA: Diagnosis not present

## 2021-12-31 DIAGNOSIS — Z8673 Personal history of transient ischemic attack (TIA), and cerebral infarction without residual deficits: Secondary | ICD-10-CM | POA: Diagnosis not present

## 2022-01-02 DIAGNOSIS — I639 Cerebral infarction, unspecified: Secondary | ICD-10-CM | POA: Diagnosis not present

## 2022-01-02 DIAGNOSIS — R55 Syncope and collapse: Secondary | ICD-10-CM | POA: Diagnosis not present

## 2022-01-02 DIAGNOSIS — Z8673 Personal history of transient ischemic attack (TIA), and cerebral infarction without residual deficits: Secondary | ICD-10-CM | POA: Diagnosis not present

## 2022-01-02 DIAGNOSIS — R0602 Shortness of breath: Secondary | ICD-10-CM | POA: Diagnosis not present

## 2022-01-02 DIAGNOSIS — R5382 Chronic fatigue, unspecified: Secondary | ICD-10-CM | POA: Diagnosis not present

## 2022-01-02 DIAGNOSIS — E785 Hyperlipidemia, unspecified: Secondary | ICD-10-CM | POA: Diagnosis not present

## 2022-01-02 DIAGNOSIS — I2089 Other forms of angina pectoris: Secondary | ICD-10-CM | POA: Diagnosis not present

## 2022-01-02 DIAGNOSIS — R609 Edema, unspecified: Secondary | ICD-10-CM | POA: Diagnosis not present

## 2022-01-02 DIAGNOSIS — I6389 Other cerebral infarction: Secondary | ICD-10-CM | POA: Diagnosis not present

## 2022-01-02 DIAGNOSIS — I10 Essential (primary) hypertension: Secondary | ICD-10-CM | POA: Diagnosis not present

## 2022-01-04 ENCOUNTER — Encounter: Payer: Self-pay | Admitting: Family Medicine

## 2022-01-04 ENCOUNTER — Encounter: Payer: Self-pay | Admitting: Oncology

## 2022-01-13 ENCOUNTER — Encounter: Payer: Self-pay | Admitting: Oncology

## 2022-01-13 ENCOUNTER — Inpatient Hospital Stay: Payer: Medicare Other | Attending: Gynecologic Oncology | Admitting: Oncology

## 2022-01-13 ENCOUNTER — Inpatient Hospital Stay: Payer: Medicare Other

## 2022-01-13 VITALS — BP 128/87 | HR 67 | Temp 97.9°F | Resp 18 | Ht 65.0 in | Wt 148.0 lb

## 2022-01-13 DIAGNOSIS — Z8673 Personal history of transient ischemic attack (TIA), and cerebral infarction without residual deficits: Secondary | ICD-10-CM | POA: Insufficient documentation

## 2022-01-13 DIAGNOSIS — Z85118 Personal history of other malignant neoplasm of bronchus and lung: Secondary | ICD-10-CM | POA: Diagnosis not present

## 2022-01-13 DIAGNOSIS — C50212 Malignant neoplasm of upper-inner quadrant of left female breast: Secondary | ICD-10-CM | POA: Diagnosis not present

## 2022-01-13 DIAGNOSIS — M8580 Other specified disorders of bone density and structure, unspecified site: Secondary | ICD-10-CM | POA: Diagnosis not present

## 2022-01-13 DIAGNOSIS — Z8616 Personal history of COVID-19: Secondary | ICD-10-CM | POA: Diagnosis not present

## 2022-01-13 DIAGNOSIS — I639 Cerebral infarction, unspecified: Secondary | ICD-10-CM | POA: Diagnosis not present

## 2022-01-13 DIAGNOSIS — Z87891 Personal history of nicotine dependence: Secondary | ICD-10-CM | POA: Insufficient documentation

## 2022-01-13 DIAGNOSIS — Z08 Encounter for follow-up examination after completed treatment for malignant neoplasm: Secondary | ICD-10-CM

## 2022-01-13 DIAGNOSIS — Z170 Estrogen receptor positive status [ER+]: Secondary | ICD-10-CM | POA: Insufficient documentation

## 2022-01-13 DIAGNOSIS — Z8542 Personal history of malignant neoplasm of other parts of uterus: Secondary | ICD-10-CM | POA: Diagnosis not present

## 2022-01-13 NOTE — Progress Notes (Signed)
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  Diagnostic mammogram and ultrasound showed indeterminate left breast asymmetry as well as a probable benign right-sided breast mass.  Biopsy of both the masses was recommended.  However the second mass resolved at the time of biopsy likely consistent with fat necrosis from anticoagulate use.  Left breast mass biopsy showed invasive mammary carcinoma grade 1 ER greater than 90% positive, PR negative and HER2 negative.   Personal history of endometrial cancer in November 2022 s/p surgery and adjuvant radiation therapy.  Also had a posterior circulation stroke in January 2022 for which she is on dual antiplatelet therapy.   Final pathology showed a 5 mm tubular carcinoma grade 1 ER greater than 90% positive PR 10% positive and HER2 negative.  Negative margins.  3 sentinel lymph nodes negative for malignancy.  PT1AN0.  Patient has baseline osteoporosis.  Tamoxifen was therefore referred but patient has not started taking it yet.    Interval history-patient had a stroke in November 2022 and then had another  recurrent event in November 2023.  Symptoms were similar to her TIA and resolved with tPA.  She is concerned about possible repercussions with her recent stroke and having to start endocrine therapy.  ECOG PS- 1 Pain scale- 0   Review of systems- Review of Systems  Constitutional:  Negative for chills, fever, malaise/fatigue and weight loss.  HENT:  Negative for congestion, ear discharge and nosebleeds.   Eyes:  Negative for blurred vision.  Respiratory:  Negative for cough, hemoptysis, sputum production, shortness of breath and wheezing.   Cardiovascular:  Negative for chest pain, palpitations, orthopnea and claudication.  Gastrointestinal:  Negative for abdominal pain, blood in stool, constipation, diarrhea, heartburn, melena, nausea and vomiting.  Genitourinary:  Negative for dysuria, flank pain, frequency, hematuria and urgency.  Musculoskeletal:  Negative for back pain, joint pain and myalgias.  Skin:  Negative for rash.  Neurological:  Negative for dizziness, tingling, focal weakness, seizures, weakness and headaches.  Endo/Heme/Allergies:  Does not bruise/bleed easily.  Psychiatric/Behavioral:  Negative for depression and suicidal ideas. The patient does not have insomnia.       Allergies  Allergen Reactions   Sulfamethoxazole-Trimethoprim Hives   Tnkase [Tenecteplase] Swelling    Mild focal left upper lip swelling     Past Medical History:  Diagnosis Date   Acute bronchospasm due to viral infection 2020   Due to pollen   Acute ischemic left MCA stroke ([place]) 04/19/2020   a.) CTA head/neck 04/19/2020 --> nearly occlusive thrombus at bifurcation of a proximal M2 MCA branch approximately 6 mm from the MCA bifurcation. Subsequent occlusion of a proximal left M3 MCA branch   Allergy    Anemia    Angina at rest  Breast cancer, left ([place]) 08/21/2021   a.) stereotactic Bx 08/21/2021 --> IMC (G1, ER/PR +, Her2/neu -)   Cataract    a.) s/p extraction with IOL placement    Depressive disorder    Diverticulosis    Dyspnea    Epistaxis    Fatigue    Hearing loss    History of 2019 novel coronavirus disease (COVID-19) 12/18/2020   History of ischemic multifocal posterior circulation stroke 02/28/2020   a.) presented to ED with increasing episodes of  transient vertical binocular diplopia; MRI 02/27/2021 --> multiple punctate acute to subacute ischemic infarcts involving the cortical/subcortical aspects of both parieto-occipital regions   History of loop recorder    History of radiation therapy    Vagina-02/11/21-03/14/21- Dr. Gery Pray   HLD (hyperlipidemia)    Hypothyroidism    LAFB (left anterior fascicular block)    Long term current use of antithrombotics/antiplatelets    a.) on DAPT therapy (ASA + clopidogrel)   Memory change    Migraine    Osteoarthritis    Osteoporosis    Palpitations    Premature menopause    QT prolongation    Seizures ([place]) 1994   history of mini seizures, possible migraine induced   Stage I adenocarcinoma of endometrium ([place]) 12/18/2020   a.) stage 1A; grade 1 (pT1a, pN0); b.) s/p TLH/BSO 12/18/2020 + adjuvant vaginal brachytherapy (30 Gy over 5 fractions between 02/11/2021 - 03/14/2021)   Vitamin D deficiency      Past Surgical History:  Procedure Laterality Date   ABDOMINAL HYSTERECTOMY  12/18/2020   ABLATION  2006   AXILLARY SENTINEL NODE BIOPSY Left 09/25/2021   Procedure: AXILLARY SENTINEL NODE BIOPSY;  Surgeon: Robert Bellow, MD;  Location: [facility];  Service: General;  Laterality: Left;   BREAST BIOPSY Left 08/21/2021   Stereo Bx, X-clip; path --> IMC (G1, ER/PR +, Her2/neu -)   BREAST LUMPECTOMY WITH NEEDLE LOCALIZATION Left 09/25/2021   Procedure: BREAST LUMPECTOMY WITH NEEDLE LOCALIZATION;  Surgeon: Robert Bellow, MD;  Location: [facility];  Service: General;  Laterality: Left;   BUBBLE STUDY  04/23/2020   Procedure: BUBBLE STUDY;  Surgeon: Larey Dresser, MD;  Location: [facility];  Service:  Cardiovascular;;   CATARACT EXTRACTION W/ INTRAOCULAR LENS IMPLANT Bilateral    COLONOSCOPY     COLONOSCOPY WITH PROPOFOL N/A 12/22/2018   Procedure: COLONOSCOPY WITH PROPOFOL;  Surgeon: Virgel Manifold, MD;  Location: [facility];  Service: Endoscopy;  Laterality: N/A;   EYE SURGERY  Spring 2018   Cataracts both eyes   HYSTEROSCOPY WITH D & C N/A 11/01/2020   Procedure: DILATATION AND CURETTAGE /HYSTEROSCOPY WITH MYOSURE;  Surgeon: Lafonda Mosses, MD;  Location: [facility];  Service: Gynecology;  Laterality: N/A;   LAPAROTOMY  12/18/2020   Procedure: LAPAROTOMY;  Surgeon: Lafonda Mosses, MD;  Location: [facility];  Service: Gynecology;;   LOOP RECORDER INSERTION N/A 04/23/2020   Procedure: LOOP RECORDER INSERTION;  Surgeon: Evans Lance, MD;  Location: [facility];  Service: Cardiovascular;  Laterality: N/A;   ROBOTIC ASSISTED TOTAL HYSTERECTOMY WITH BILATERAL SALPINGO OOPHERECTOMY Bilateral 12/18/2020   Procedure: XI ROBOTIC ASSISTED TOTAL HYSTERECTOMY WITH BILATERAL SALPINGO OOPHORECTOMY;  Surgeon: Lafonda Mosses, MD;  Location: [facility];  Service: Gynecology;  Laterality: Bilateral;   SENTINEL NODE BIOPSY N/A 12/18/2020   Procedure: SENTINEL NODE BIOPSY;  Surgeon: Lafonda Mosses, MD;  Location: [facility];  Service: Gynecology;  Laterality: N/A;   TEE WITHOUT CARDIOVERSION N/A 04/23/2020  Procedure: TRANSESOPHAGEAL ECHOCARDIOGRAM (TEE);  Surgeon: Larey Dresser, MD;  Location: [facility];  Service: Cardiovascular;  Laterality: N/A;   TONSILLECTOMY      Social History   Socioeconomic History   Marital status: Single    Spouse name: Not on file   Number of children: 1   Years of education: Not on file   Highest education level: Bachelor's degree (e.g., BA, AB, BS)  Occupational History   Occupation: retired   Tobacco Use   Smoking status: Former    Packs/day: 0.50    Years: 6.00    Total pack years: 3.00    Types: Cigarettes    Quit date: 02/11/1974     Years since quitting: 47.9   Smokeless tobacco: Never  Vaping Use   Vaping Use: Never used  Substance and Sexual Activity   Alcohol use: Not Currently   Drug use: Never   Sexual activity: Not Currently    Comment: Don't use not sexually active  Other Topics Concern   Not on file  Social History Narrative   Raised an adopted child on her own   Working part time reviewed documents   Social Determinants of Health   Financial Resource Strain: [place]  (04/02/2021)   Overall Financial Resource Strain (CARDIA)    Difficulty of Paying Living Expenses: Not hard at all  Food Insecurity: No [place] (12/05/2021)   Hunger Vital Sign    Worried About Running Out of Food in the Last Year: Never true    [place] in the Last Year: Never true  Transportation Needs: No Transportation Needs (12/05/2021)   PRAPARE - Hydrologist (Medical): No    Lack of Transportation (Non-Medical): No  Physical Activity: Insufficiently Active (04/02/2021)   Exercise Vital Sign    Days of Exercise per Week: 2 days    Minutes of Exercise per Session: 30 min  Stress: No Stress Concern Present (04/02/2021)   [place]    Feeling of Stress : Not at all  Social Connections: Moderately Integrated (04/02/2021)   Social Connection and Isolation Panel [NHANES]    Frequency of Communication with Friends and Family: More than three times a week    Frequency of Social Gatherings with Friends and Family: Twice a week    Attends Religious Services: More than 4 times per year    Active Member of [employer] or Organizations: Yes    Attends Music therapist: More than 4 times per year    Marital Status: Never married  Intimate Partner Violence: Not At Risk (12/05/2021)   Humiliation, Afraid, Rape, and Kick questionnaire    Fear of Current or Ex-Partner: No    Emotionally Abused: No    Physically Abused: No     Sexually Abused: No    Family History  Problem Relation Age of Onset   Early death Father        suicide   Depression Father    Diabetes Father    Cancer Father        prostate   Hearing loss Father        due to war   Alzheimer's disease Mother    Heart disease Brother    Atrial fibrillation Brother    Heart disease Maternal Aunt    Dementia Maternal Aunt    Heart disease Maternal Uncle    Dementia Maternal Grandmother    Heart disease Brother  Atrial fibrillation Brother    Colon cancer Neg Hx    Breast cancer Neg Hx    Ovarian cancer Neg Hx    Pancreatic cancer Neg Hx    Endometrial cancer Neg Hx      Current Outpatient Medications:    alendronate (FOSAMAX) 70 MG tablet, Take 1 tablet (70 mg total) by mouth once a week. Take with a full glass of water on an empty stomach., Disp: 4 tablet, Rfl: 5   aspirin 81 MG EC tablet, Take by mouth., Disp: , Rfl:    aspirin-acetaminophen-caffeine (EXCEDRIN MIGRAINE) 250-250-65 MG tablet, Take by mouth every 6 (six) hours as needed for headache or migraine., Disp: , Rfl:    atorvastatin (LIPITOR) 40 MG tablet, Take 1 tablet (40 mg total) by mouth daily., Disp: 90 tablet, Rfl: 1   buPROPion (WELLBUTRIN SR) 150 MG 12 hr tablet, Take 1 tablet (150 mg total) by mouth 2 (two) times daily., Disp: , Rfl:    citalopram (CELEXA) 40 MG tablet, TAKE 1 TABLET BY MOUTH EVERY DAY, Disp: 90 tablet, Rfl: 1   clopidogrel (PLAVIX) 75 MG tablet, Take 1 tablet (75 mg total) by mouth daily., Disp: 1 tablet, Rfl: 0   Cyanocobalamin (VITAMIN B-12) 1000 MCG SUBL, Place 1 tablet (1,000 mcg total) under the tongue 2 (two) times a week., Disp: 30 tablet, Rfl: 0   levothyroxine (SYNTHROID) 75 MCG tablet, TAKE 1 TABLET(75 MCG) BY MOUTH DAILY BEFORE BREAKFAST, Disp: 90 tablet, Rfl: 1   Multiple Vitamin (MULTIVITAMIN WITH MINERALS) TABS tablet, Take 1 tablet by mouth daily., Disp: , Rfl:    polyvinyl alcohol (LIQUIFILM TEARS) 1.4 % ophthalmic solution, Place 1  drop into both eyes daily as needed for dry eyes., Disp: , Rfl:    Ubrogepant (UBRELVY) 100 MG TABS, Take 100 mg by mouth daily as needed (migraines)., Disp: , Rfl:    Vitamin D, Ergocalciferol, (DRISDOL) 1.25 MG (50000 UNIT) CAPS capsule, Take 1 capsule (50,000 Units total) by mouth every 14 (fourteen) days., Disp: 6 capsule, Rfl: 1  Physical exam:  Vitals:   01/13/22 1115 01/13/22 1116  BP:  128/87  Pulse:  67  Resp: 18   Temp: 97.9 F (36.6 C)   TempSrc: Tympanic   SpO2: 99%   Weight: 148 lb (67.1 kg)   Height: _0  (1.651 m)    Physical Exam Cardiovascular:     Rate and Rhythm: Normal rate and regular rhythm.     Heart sounds: Normal heart sounds.  Pulmonary:     Effort: Pulmonary effort is normal.     Breath sounds: Normal breath sounds.  Abdominal:     General: Bowel sounds are normal.     Palpations: Abdomen is soft.  Skin:    General: Skin is warm and dry.  Neurological:     Mental Status: She is alert and oriented to person, place, and time.         Latest Ref Rng & Units 12/06/2021    4:53 AM  CMP  Glucose 70 - 99 mg/dL 119   BUN 8 - 23 mg/dL 23   Creatinine 0.44 - 1.00 mg/dL 0.72   Sodium 135 - 145 mmol/L 139   Potassium 3.5 - 5.1 mmol/L 3.6   Chloride 98 - 111 mmol/L 107   CO2 22 - 32 mmol/L 26   Calcium 8.9 - 10.3 mg/dL 8.0       Latest Ref Rng & Units 12/05/2021    5:01 AM  CBC  WBC 4.0 - 10.5 K/uL 2.1   Hemoglobin 12.0 - 15.0 g/dL 11.7   Hematocrit 36.0 - 46.0 % 35.2   Platelets 150 - 400 K/uL 156     No images are attached to the encounter.  No results found.   Assessment and plan- Patient is a 71 y.o. female with stage Ia invasive mammary carcinoma/tubular carcinoma of the left breast pT1 aN0 M0 ER/PR positive HER2 negative.  She is here to discuss endocrine therapy options  Patient had a small 5 mm tubular grade 1 invasive mammary carcinoma of the left breast s/p lumpectomy and adjuvant radiation therapy.  She has baseline osteoporosis  and therefore aromatase inhibitors was not my first choice.  I was contemplating starting her on tamoxifen and I did reach out to neurology Dr. Manuella Ghazi if it would be okay for her to go on tamoxifen as long as she was on aspirin and Plavix for her stroke.  That was deemed to be a possible option but before she could start on tamoxifen she had another TIA in November 2023.  She is currently being worked up for the same.  She has a Holter monitor in place and also will be undergoing sleep apnea workup.  I will get antiphospholipid antibody testing done today.  Options at this time for her breast cancer include attempting tamoxifen although she has an increased risk of thromboembolic events associated with tamoxifen versus attempting AI with close monitoring of her bone health versus no endocrine therapy and observation alone.  Patient would like to hold off on starting any endocrine therapy at this time and I will see her back in 3 months after her stroke workup is completed.   Visit Diagnosis 1. Recurrent strokes ([place])   2. Encounter for follow-up surveillance of lung cancer      Dr. Randa Evens, MD, MPH [facility] [phone] 01/13/2022 3:45 PM

## 2022-01-14 ENCOUNTER — Encounter: Payer: Self-pay | Admitting: *Deleted

## 2022-01-14 LAB — BETA-2-GLYCOPROTEIN I ABS, IGG/M/A
Beta-2 Glyco I IgG: <9 GPI IgG units (ref 0–20)
Beta-2-Glycoprotein I IgA: <9 GPI IgA units (ref 0–25)
Beta-2-Glycoprotein I IgM: <9 GPI IgM units (ref 0–32)

## 2022-01-15 LAB — LUPUS ANTICOAGULANT
DRVVT: 32.5 s (ref 0.0–47.0)
PTT Lupus Anticoagulant: 37.6 s (ref 0.0–43.5)
Thrombin Time: 15.8 s (ref 0.0–23.0)
dPT Confirm Ratio: 0.95 Ratio (ref 0.00–1.34)
dPT: 37.9 s (ref 0.0–47.6)

## 2022-01-15 LAB — CARDIOLIPIN ANTIBODIES, IGM+IGG
Anticardiolipin IgG: <9 GPL U/mL (ref 0–14)
Anticardiolipin IgM: <9 MPL U/mL (ref 0–12)

## 2022-01-15 LAB — HEXAGONAL PHASE PHOSPHOLIPID: Hex Phosph Neut Test: 6 s (ref 0–11)

## 2022-01-15 LAB — HEX PHASE PHOSPHOLIPID REFLEX

## 2022-02-08 DIAGNOSIS — G4733 Obstructive sleep apnea (adult) (pediatric): Secondary | ICD-10-CM | POA: Diagnosis not present

## 2022-02-10 DIAGNOSIS — I6389 Other cerebral infarction: Secondary | ICD-10-CM | POA: Diagnosis not present

## 2022-02-14 ENCOUNTER — Other Ambulatory Visit: Payer: Self-pay | Admitting: Family Medicine

## 2022-02-14 DIAGNOSIS — E039 Hypothyroidism, unspecified: Secondary | ICD-10-CM

## 2022-02-22 DIAGNOSIS — G4733 Obstructive sleep apnea (adult) (pediatric): Secondary | ICD-10-CM | POA: Diagnosis not present

## 2022-03-11 DIAGNOSIS — R2681 Unsteadiness on feet: Secondary | ICD-10-CM | POA: Diagnosis not present

## 2022-03-11 DIAGNOSIS — R002 Palpitations: Secondary | ICD-10-CM | POA: Diagnosis not present

## 2022-03-11 DIAGNOSIS — E785 Hyperlipidemia, unspecified: Secondary | ICD-10-CM | POA: Diagnosis not present

## 2022-03-11 DIAGNOSIS — Z8669 Personal history of other diseases of the nervous system and sense organs: Secondary | ICD-10-CM | POA: Diagnosis not present

## 2022-03-11 DIAGNOSIS — G3184 Mild cognitive impairment, so stated: Secondary | ICD-10-CM | POA: Diagnosis not present

## 2022-03-11 DIAGNOSIS — I10 Essential (primary) hypertension: Secondary | ICD-10-CM | POA: Diagnosis not present

## 2022-03-11 DIAGNOSIS — R5382 Chronic fatigue, unspecified: Secondary | ICD-10-CM | POA: Diagnosis not present

## 2022-03-11 DIAGNOSIS — G4733 Obstructive sleep apnea (adult) (pediatric): Secondary | ICD-10-CM | POA: Diagnosis not present

## 2022-03-11 DIAGNOSIS — Z8673 Personal history of transient ischemic attack (TIA), and cerebral infarction without residual deficits: Secondary | ICD-10-CM | POA: Diagnosis not present

## 2022-03-13 ENCOUNTER — Ambulatory Visit: Payer: Medicare Other | Attending: Neurology | Admitting: Speech Pathology

## 2022-03-13 DIAGNOSIS — R41841 Cognitive communication deficit: Secondary | ICD-10-CM | POA: Diagnosis not present

## 2022-03-13 DIAGNOSIS — Z8673 Personal history of transient ischemic attack (TIA), and cerebral infarction without residual deficits: Secondary | ICD-10-CM | POA: Insufficient documentation

## 2022-03-13 DIAGNOSIS — R2681 Unsteadiness on feet: Secondary | ICD-10-CM | POA: Insufficient documentation

## 2022-03-13 DIAGNOSIS — M6281 Muscle weakness (generalized): Secondary | ICD-10-CM | POA: Diagnosis not present

## 2022-03-13 DIAGNOSIS — R262 Difficulty in walking, not elsewhere classified: Secondary | ICD-10-CM | POA: Insufficient documentation

## 2022-03-13 DIAGNOSIS — R269 Unspecified abnormalities of gait and mobility: Secondary | ICD-10-CM | POA: Insufficient documentation

## 2022-03-13 DIAGNOSIS — R2689 Other abnormalities of gait and mobility: Secondary | ICD-10-CM | POA: Diagnosis not present

## 2022-03-13 DIAGNOSIS — R4701 Aphasia: Secondary | ICD-10-CM | POA: Insufficient documentation

## 2022-03-13 DIAGNOSIS — I63412 Cerebral infarction due to embolism of left middle cerebral artery: Secondary | ICD-10-CM | POA: Diagnosis not present

## 2022-03-13 NOTE — Progress Notes (Signed)
Name: Natalie [name]   MRN: [id number]    DOB: [birth year]   Date:03/14/2022       Progress Note  Subjective  Chief Complaint  Follow Up  HPI  Recurrent strokes: first one TIA 01/22, stroke 04/2020 and another one 12/2021. She is on Plavix and aspirin, under the care of neurologist . She has OSA but does not want to use CPAP, waiting to see dentist for mandibular advancement device   Mild Cognitive Dysfunction: difficulty with word finding. She is seeing Astronomer , she was also referred to a neuropsychologist   MDD: she  was under Dr. Leonette Most care for many years but he is retiring and she asked me to prescribe her medications from now on. she takes medications as prescribed. No side effects, she is upset about not being able to travel due to multiple strokes but appreciates her life   B12 deficiency/history of vitamin D deficiency: reviewed last labs.   Hearing loss: she went back to ENT but hearing stable since 2014 when she had it checked last time   Hypothyroidism: she has been taking medication as prescribed, she states hair is not falling as much, she has intermittent  dry skin, no constipation . Last TSH was at goal, but she is due for labs today   HPV positive/High CA 125/ovarian mass diagnosed 2022  : she alternates visit with  Dr. Alvy Bimler and after that   Dr. Jeral Pinch , treated for endometrium cancer and is now in remission   Breast cancer left : lumpectomy done 09/2021 , s/p radiation but no chemotherapy, seeing Dr. Donella Stade every year She also follows up with surgeon, unable to take Tamoxifen due to strokes   Malnutrition: she lost a lot of weight since 2022, started with diet, followed by endometrial and breast cancer, weight is finely stable.   Osteoporosis: taking Alendronate   Dupuytren's contractures: she has seen Dr. Peggye Ley in the past and is noticing worsening of symptoms right hand, also trigger finger of right 4th finger right side. She had some steroid  injections   Patient Active Problem List   Diagnosis Date Noted   Cerebrovascular accident (CVA) ([place]) 12/05/2021   Acute CVA (cerebrovascular accident) ([place]) 12/04/2021   Trigger finger, right ring finger 09/10/2021   Dupuytren's contracture of both hands 09/10/2021   Vitamin D deficiency 09/10/2021   Malignant neoplasm of left breast in female, estrogen receptor positive ([place]) 09/01/2021   Dyslipidemia 05/16/2021   Major depression, recurrent, chronic ([place]) 03/13/2021   Mild protein-calorie malnutrition ([place]) 03/13/2021   Endometrial cancer ([place]) 12/26/2020   Postoperative anemia due to acute blood loss 12/19/2020   History of CVA in adulthood 04/19/2020   History of ischemic multifocal posterior circulation stroke 02/29/2020   Difficulty hearing 06/28/2015   Arthritis, degenerative 06/28/2015   Paresthesia 06/28/2015   Purpura, nonthrombopenic ([place]) 06/28/2015   Adult hypothyroidism 08/09/2014   Migraine without aura and without status migrainosus, not intractable 09/19/2008   Moderate major depression ([place]) 09/07/2006   OP (osteoporosis) 09/07/2006    Past Surgical History:  Procedure Laterality Date   ABDOMINAL HYSTERECTOMY  12/18/2020   ABLATION  2006   AXILLARY SENTINEL NODE BIOPSY Left 09/25/2021   Procedure: AXILLARY SENTINEL NODE BIOPSY;  Surgeon: Robert Bellow, MD;  Location: [facility] ORS;  Service: General;  Laterality: Left;   BREAST BIOPSY Left 08/21/2021   Stereo Bx, X-clip; path --> IMC (G1, ER/PR +, Her2/neu -)   BREAST LUMPECTOMY WITH NEEDLE LOCALIZATION Left 09/25/2021  Procedure: BREAST LUMPECTOMY WITH NEEDLE LOCALIZATION;  Surgeon: Robert Bellow, MD;  Location: [place] [facility];  Service: General;  Laterality: Left;   BUBBLE STUDY  04/23/2020   Procedure: BUBBLE STUDY;  Surgeon: Larey Dresser, MD;  Location: [facility] ENDOSCOPY;  Service: Cardiovascular;;   CATARACT EXTRACTION W/ INTRAOCULAR LENS IMPLANT Bilateral    COLONOSCOPY     COLONOSCOPY WITH PROPOFOL N/A  12/22/2018   Procedure: COLONOSCOPY WITH PROPOFOL;  Surgeon: Virgel Manifold, MD;  Location: [facility] ENDOSCOPY;  Service: Endoscopy;  Laterality: N/A;   EYE SURGERY  Spring 2018   Cataracts both eyes   HYSTEROSCOPY WITH D & C N/A 11/01/2020   Procedure: DILATATION AND CURETTAGE /HYSTEROSCOPY WITH MYOSURE;  Surgeon: Lafonda Mosses, MD;  Location: [facility];  Service: Gynecology;  Laterality: N/A;   LAPAROTOMY  12/18/2020   Procedure: LAPAROTOMY;  Surgeon: Lafonda Mosses, MD;  Location: [facility];  Service: Gynecology;;   LOOP RECORDER INSERTION N/A 04/23/2020   Procedure: LOOP RECORDER INSERTION;  Surgeon: Evans Lance, MD;  Location: [place] [facility];  Service: Cardiovascular;  Laterality: N/A;   ROBOTIC ASSISTED TOTAL HYSTERECTOMY WITH BILATERAL SALPINGO OOPHERECTOMY Bilateral 12/18/2020   Procedure: XI ROBOTIC ASSISTED TOTAL HYSTERECTOMY WITH BILATERAL SALPINGO OOPHORECTOMY;  Surgeon: Lafonda Mosses, MD;  [facility];  Service: Gynecology;  Laterality: Bilateral;   SENTINEL NODE BIOPSY N/A 12/18/2020   Procedure: SENTINEL NODE BIOPSY;  Surgeon: Lafonda Mosses, MD;  [facility];  Service: Gynecology;  Laterality: N/A;   TEE WITHOUT CARDIOVERSION N/A 04/23/2020   Procedure: TRANSESOPHAGEAL ECHOCARDIOGRAM (TEE);  Surgeon: Larey Dresser, MD;  Location: [facility] ENDOSCOPY;  Service: Cardiovascular;  Laterality: N/A;   TONSILLECTOMY      Family History  Problem Relation Age of Onset   Early death Father        suicide   Depression Father    Diabetes Father    Cancer Father        prostate   Hearing loss Father        due to war   Alzheimer's disease Mother    Heart disease Brother    Atrial fibrillation Brother    Heart disease Maternal Aunt    Dementia Maternal Aunt    Heart disease Maternal Uncle    Dementia Maternal Grandmother    Heart disease Brother    Atrial fibrillation Brother    Colon cancer Neg Hx    Breast cancer Neg Hx    Ovarian cancer Neg  Hx    Pancreatic cancer Neg Hx    Endometrial cancer Neg Hx     Social History   Tobacco Use   Smoking status: Former    Packs/day: 0.50    Years: 6.00    Total pack years: 3.00    Types: Cigarettes    Quit date: 02/11/1974    Years since quitting: 48.1   Smokeless tobacco: Never  Substance Use Topics   Alcohol use: Not Currently     Current Outpatient Medications:    alendronate (FOSAMAX) 70 MG tablet, TAKE 1 TABLET(70 MG) BY MOUTH 1 TIME A WEEK WITH A FULL GLASS OF WATER AND ON AN EMPTY STOMACH, Disp: 4 tablet, Rfl: 5   aspirin 81 MG EC tablet, Take by mouth., Disp: , Rfl:    aspirin-acetaminophen-caffeine (EXCEDRIN MIGRAINE) 250-250-65 MG tablet, Take by mouth every 6 (six) hours as needed for headache or migraine., Disp: , Rfl:    atorvastatin (LIPITOR) 40 MG tablet, Take 1 tablet (  40 mg total) by mouth daily., Disp: 90 tablet, Rfl: 1   buPROPion (WELLBUTRIN SR) 150 MG 12 hr tablet, Take 1 tablet (150 mg total) by mouth 2 (two) times daily., Disp: , Rfl:    citalopram (CELEXA) 40 MG tablet, TAKE 1 TABLET BY MOUTH EVERY DAY, Disp: 90 tablet, Rfl: 1   clopidogrel (PLAVIX) 75 MG tablet, Take 1 tablet (75 mg total) by mouth daily., Disp: 1 tablet, Rfl: 0   Cyanocobalamin (VITAMIN B-12) 1000 MCG SUBL, Place 1 tablet (1,000 mcg total) under the tongue 2 (two) times a week., Disp: 30 tablet, Rfl: 0   levothyroxine (SYNTHROID) 75 MCG tablet, TAKE 1 TABLET(75 MCG) BY MOUTH DAILY BEFORE BREAKFAST, Disp: 90 tablet, Rfl: 0   Multiple Vitamin (MULTIVITAMIN WITH MINERALS) TABS tablet, Take 1 tablet by mouth daily., Disp: , Rfl:    polyvinyl alcohol (LIQUIFILM TEARS) 1.4 % ophthalmic solution, Place 1 drop into both eyes daily as needed for dry eyes., Disp: , Rfl:    Ubrogepant (UBRELVY) 100 MG TABS, Take 100 mg by mouth daily as needed (migraines)., Disp: , Rfl:    Vitamin D, Ergocalciferol, (DRISDOL) 1.25 MG (50000 UNIT) CAPS capsule, Take 1 capsule (50,000 Units total) by mouth every 14  (fourteen) days., Disp: 6 capsule, Rfl: 1  Allergies  Allergen Reactions   Sulfamethoxazole-Trimethoprim Hives   Tnkase [Tenecteplase] Swelling    Mild focal left upper lip swelling    I personally reviewed active problem list, medication list, allergies, family history, social history, health maintenance with the patient/caregiver today.   ROS  Constitutional: Negative for fever or weight change.  Respiratory: Negative for cough and shortness of breath.   Cardiovascular: Negative for chest pain or palpitations.  Gastrointestinal: Negative for abdominal pain, no bowel changes.  Musculoskeletal: Negative for gait problem or joint swelling.  Skin: Negative for rash.  Neurological: Negative for dizziness or headache.  No other specific complaints in a complete review of systems (except as listed in HPI above).   Objective  Vitals:   03/14/22 0937  BP: 124/82  Pulse: 69  Resp: 14  Temp: 97.6 F (36.4 C)  TempSrc: Oral  SpO2: 98%  Weight: 150 lb 9.6 oz (68.3 kg)  Height: 5' 5"$  (1.651 m)    Body mass index is 25.06 kg/m.  Physical Exam  Constitutional: Patient appears well-developed and well-nourished. Obese  No distress.  HEENT: head atraumatic, normocephalic, pupils equal and reactive to light, neck supple Cardiovascular: Normal rate, regular rhythm and normal heart sounds.  No murmur heard. No BLE edema. Pulmonary/Chest: Effort normal and breath sounds normal. No respiratory distress. Abdominal: Soft.  There is no tenderness. Psychiatric: Patient has a normal mood and affect. behavior is normal. Judgment and thought content normal.    PHQ2/9:    03/14/2022    9:38 AM 12/11/2021   11:27 AM 09/10/2021   10:56 AM 06/18/2021   10:22 AM 04/02/2021    2:25 PM  Depression screen PHQ 2/9  Decreased Interest 0 0 0 0 0  Down, Depressed, Hopeless 1 0 0 0 0  PHQ - 2 Score 1 0 0 0 0  Altered sleeping 0 0 0 1 0  Tired, decreased energy 1 0 0 1 1  Change in appetite 1 0 0 1 1   Feeling bad or failure about yourself  0 0 0 0 0  Trouble concentrating 0 0 0 1 0  Moving slowly or fidgety/restless 0 0 0 0 0  Suicidal thoughts 0 0 0  0 0  PHQ-9 Score 3 0 0 4 2  Difficult doing work/chores Not difficult at all   Not difficult at all     phq 9 is negative   Fall Risk:    03/14/2022    9:38 AM 12/11/2021   11:26 AM 09/10/2021   10:56 AM 06/18/2021   10:22 AM 04/02/2021    2:30 PM  Fall Risk   Falls in the past year? 0 0 0 1 1  Number falls in past yr:  0 0 0 1  Injury with Fall?  0 0 1 1  Risk for fall due to : No Fall Risks No Fall Risks No Fall Risks History of fall(s) No Fall Risks  Follow up Falls prevention discussed Falls prevention discussed Falls prevention discussed Falls prevention discussed;Education provided;Falls evaluation completed Falls prevention discussed     Assessment & Plan  1. Major depression, recurrent, chronic (HCC)  - COMPLETE METABOLIC PANEL WITH GFR - buPROPion (WELLBUTRIN SR) 150 MG 12 hr tablet; Take 1 tablet (150 mg total) by mouth 2 (two) times daily.  Dispense: 180 tablet; Refill: 1  2. TIA on medication  - COMPLETE METABOLIC PANEL WITH GFR  3. Vitamin D deficiency  - VITAMIN D 25 Hydroxy (Vit-D Deficiency, Fractures)  4. Dyslipidemia  - atorvastatin (LIPITOR) 40 MG tablet; Take 1 tablet (40 mg total) by mouth daily.  Dispense: 90 tablet; Refill: 1  5. B12 deficiency  - Vitamin B12  6. Dupuytren's contracture of both hands   7. History of CVA (cerebrovascular accident)  - atorvastatin (LIPITOR) 40 MG tablet; Take 1 tablet (40 mg total) by mouth daily.  Dispense: 90 tablet; Refill: 1  8. Adult hypothyroidism  - TSH  9. History of malignant neoplasm of endometrium   10. Colon cancer screening  Cannot have it due to recent stroke

## 2022-03-14 ENCOUNTER — Ambulatory Visit (INDEPENDENT_AMBULATORY_CARE_PROVIDER_SITE_OTHER): Payer: Medicare Other | Admitting: Family Medicine

## 2022-03-14 ENCOUNTER — Encounter: Payer: Self-pay | Admitting: Family Medicine

## 2022-03-14 ENCOUNTER — Other Ambulatory Visit: Payer: Self-pay | Admitting: Oncology

## 2022-03-14 VITALS — BP 124/82 | HR 69 | Temp 97.6°F | Resp 14 | Ht 65.0 in | Wt 150.6 lb

## 2022-03-14 DIAGNOSIS — Z8673 Personal history of transient ischemic attack (TIA), and cerebral infarction without residual deficits: Secondary | ICD-10-CM | POA: Diagnosis not present

## 2022-03-14 DIAGNOSIS — Z1211 Encounter for screening for malignant neoplasm of colon: Secondary | ICD-10-CM

## 2022-03-14 DIAGNOSIS — E785 Hyperlipidemia, unspecified: Secondary | ICD-10-CM | POA: Diagnosis not present

## 2022-03-14 DIAGNOSIS — M720 Palmar fascial fibromatosis [Dupuytren]: Secondary | ICD-10-CM | POA: Diagnosis not present

## 2022-03-14 DIAGNOSIS — G459 Transient cerebral ischemic attack, unspecified: Secondary | ICD-10-CM

## 2022-03-14 DIAGNOSIS — E039 Hypothyroidism, unspecified: Secondary | ICD-10-CM | POA: Diagnosis not present

## 2022-03-14 DIAGNOSIS — F339 Major depressive disorder, recurrent, unspecified: Secondary | ICD-10-CM | POA: Diagnosis not present

## 2022-03-14 DIAGNOSIS — E559 Vitamin D deficiency, unspecified: Secondary | ICD-10-CM

## 2022-03-14 DIAGNOSIS — E538 Deficiency of other specified B group vitamins: Secondary | ICD-10-CM | POA: Diagnosis not present

## 2022-03-14 DIAGNOSIS — Z8542 Personal history of malignant neoplasm of other parts of uterus: Secondary | ICD-10-CM

## 2022-03-14 MED ORDER — ATORVASTATIN CALCIUM 40 MG PO TABS: 40.0000 mg | ORAL_TABLET | Freq: Every day | ORAL | 1 refills | Status: DC

## 2022-03-14 MED ORDER — CLOPIDOGREL BISULFATE 75 MG PO TABS: 75.0000 mg | ORAL_TABLET | Freq: Every day | ORAL | 0 refills | Status: DC

## 2022-03-14 MED ORDER — BUPROPION HCL ER (SR) 150 MG PO TB12: 150.0000 mg | ORAL_TABLET | Freq: Two times a day (BID) | ORAL | 1 refills | Status: DC

## 2022-03-15 LAB — COMPLETE METABOLIC PANEL WITH GFR
AG Ratio: 2.1 (calc) (ref 1.0–2.5)
ALT: 16 U/L (ref 6–29)
AST: 16 U/L (ref 10–35)
Albumin: 4.1 g/dL (ref 3.6–5.1)
Alkaline phosphatase (APISO): 56 U/L (ref 37–153)
BUN: 20 mg/dL (ref 7–25)
CO2: 29 mmol/L (ref 20–32)
Calcium: 8.8 mg/dL (ref 8.6–10.4)
Chloride: 104 mmol/L (ref 98–110)
Creat: 0.8 mg/dL (ref 0.60–1.00)
Globulin: 2 g/dL (calc) (ref 1.9–3.7)
Glucose, Bld: 90 mg/dL (ref 65–99)
Potassium: 3.8 mmol/L (ref 3.5–5.3)
Sodium: 141 mmol/L (ref 135–146)
Total Bilirubin: 0.6 mg/dL (ref 0.2–1.2)
Total Protein: 6.1 g/dL (ref 6.1–8.1)
eGFR: 78 mL/min/{1.73_m2} (ref >=60)

## 2022-03-15 LAB — TSH: TSH: 1.81 mIU/L (ref 0.40–4.50)

## 2022-03-15 LAB — VITAMIN B12: Vitamin B-12: 639 pg/mL (ref 200–1100)

## 2022-03-15 LAB — VITAMIN D 25 HYDROXY (VIT D DEFICIENCY, FRACTURES): Vit D, 25-Hydroxy: 97 ng/mL (ref 30–100)

## 2022-03-16 NOTE — Therapy (Unsigned)
OUTPATIENT SPEECH LANGUAGE PATHOLOGY  COGNITION EVALUATION   Patient Name: Natalie [name] MRN: [id number] DOB:03/31/1950, 72 y.o., female Today's Date: 03/13/2022  PCP: [name], MD REFERRING PROVIDER: Jennings Books, MD   End of Session - 03/13/22 1934     Visit Number 1    Number of Visits 25    Date for SLP Re-Evaluation 06/01/22    Authorization Type Medicare A/Medicare B    Progress Note Due on Visit 10    SLP Start Time 1400    SLP Stop Time  1500    SLP Time Calculation (min) 60 min    Activity Tolerance Patient tolerated treatment well             Past Medical History:  Diagnosis Date   Acute bronchospasm due to viral infection 2020   Due to pollen   Acute ischemic left MCA stroke ([place]) 04/19/2020   a.) CTA head/neck 04/19/2020 --> nearly occlusive thrombus at bifurcation of a proximal M2 MCA branch approximately 6 mm from the MCA bifurcation. Subsequent occlusion of a proximal left M3 MCA branch   Allergy    Anemia    Angina at rest    Breast cancer, left ([place]) 08/21/2021   a.) stereotactic Bx 08/21/2021 --> IMC (G1, ER/PR +, Her2/neu -)   Cataract    a.) s/p extraction with IOL placement   Depressive disorder    Diverticulosis    Dyspnea    Epistaxis    Fatigue    Hearing loss    History of 2019 novel coronavirus disease (COVID-19) 12/18/2020   History of ischemic multifocal posterior circulation stroke 02/28/2020   a.) presented to ED with increasing episodes of  transient vertical binocular diplopia; MRI 02/27/2021 --> multiple punctate acute to subacute ischemic infarcts involving the cortical/subcortical aspects of both parieto-occipital regions   History of loop recorder    History of radiation therapy    Vagina-02/11/21-03/14/21- Dr. Gery Pray   HLD (hyperlipidemia)    Hypothyroidism    LAFB (left anterior fascicular block)    Long term current use of antithrombotics/antiplatelets    a.) on DAPT therapy (ASA + clopidogrel)   Memory  change    Migraine    Osteoarthritis    Osteoporosis    Palpitations    Premature menopause    QT prolongation    Seizures ([place]) 1994   history of mini seizures, possible migraine induced   Stage I adenocarcinoma of endometrium ([place]) 12/18/2020   a.) stage 1A; grade 1 (pT1a, pN0); b.) s/p TLH/BSO 12/18/2020 + adjuvant vaginal brachytherapy (30 Gy over 5 fractions between 02/11/2021 - 03/14/2021)   Vitamin D deficiency    Past Surgical History:  Procedure Laterality Date   ABDOMINAL HYSTERECTOMY  12/18/2020   ABLATION  2006   AXILLARY SENTINEL NODE BIOPSY Left 09/25/2021   Procedure: AXILLARY SENTINEL NODE BIOPSY;  Surgeon: Robert Bellow, MD;  Location: [facility] ORS;  Service: General;  Laterality: Left;   BREAST BIOPSY Left 08/21/2021   Stereo Bx, X-clip; path --> IMC (G1, ER/PR +, Her2/neu -)   BREAST LUMPECTOMY WITH NEEDLE LOCALIZATION Left 09/25/2021   Procedure: BREAST LUMPECTOMY WITH NEEDLE LOCALIZATION;  Surgeon: Robert Bellow, MD;  Location: [facility] ORS;  Service: General;  Laterality: Left;   BUBBLE STUDY  04/23/2020   Procedure: BUBBLE STUDY;  Surgeon: Larey Dresser, MD;  Location[facility] ENDOSCOPY;  Service: Cardiovascular;;   CATARACT EXTRACTION W/ INTRAOCULAR LENS IMPLANT Bilateral    COLONOSCOPY     COLONOSCOPY  WITH PROPOFOL N/A 12/22/2018   Procedure: COLONOSCOPY WITH PROPOFOL;  Surgeon: Virgel Manifold, MD;  Location: [facility] ENDOSCOPY;  Service: Endoscopy;  Laterality: N/A;   EYE SURGERY  Spring 2018   Cataracts both eyes   HYSTEROSCOPY WITH D & C N/A 11/01/2020   Procedure: DILATATION AND CURETTAGE /HYSTEROSCOPY WITH MYOSURE;  Surgeon: Lafonda Mosses, MD;  [facility]: [facility];  Service: Gynecology;  Laterality: N/A;   LAPAROTOMY  12/18/2020   Procedure: LAPAROTOMY;  Surgeon: Lafonda Mosses, MD;  [facility]: [facility];  Service: Gynecology;;   LOOP RECORDER INSERTION N/A 04/23/2020   Procedure: LOOP RECORDER INSERTION;  Surgeon: Evans Lance, MD;  Location:  [place] CV LAB;  Service: Cardiovascular;  Laterality: N/A;   ROBOTIC ASSISTED TOTAL HYSTERECTOMY WITH BILATERAL SALPINGO OOPHERECTOMY Bilateral 12/18/2020   Procedure: XI ROBOTIC ASSISTED TOTAL HYSTERECTOMY WITH BILATERAL SALPINGO OOPHORECTOMY;  Surgeon: Lafonda Mosses, MD;  [facility]: [facility];  Service: Gynecology;  Laterality: Bilateral;   SENTINEL NODE BIOPSY N/A 12/18/2020   Procedure: SENTINEL NODE BIOPSY;  Surgeon: Lafonda Mosses, MD;  [facility];  Service: Gynecology;  Laterality: N/A;   TEE WITHOUT CARDIOVERSION N/A 04/23/2020   Procedure: TRANSESOPHAGEAL ECHOCARDIOGRAM (TEE);  Surgeon: Larey Dresser, MD;  Location: [facility] ENDOSCOPY;  Service: Cardiovascular;  Laterality: N/A;   TONSILLECTOMY     Patient Active Problem List   Diagnosis Date Noted   Cerebrovascular accident (CVA) ([place]) 12/05/2021   Acute CVA (cerebrovascular accident) ([place]) 12/04/2021   Trigger finger, right ring finger 09/10/2021   Dupuytren's contracture of both hands 09/10/2021   Vitamin D deficiency 09/10/2021   Malignant neoplasm of left breast in female, estrogen receptor positive ([place]) 09/01/2021   Dyslipidemia 05/16/2021   Major depression, recurrent, chronic ([place]) 03/13/2021   Mild protein-calorie malnutrition ([place]) 03/13/2021   Endometrial cancer ([place]) 12/26/2020   Postoperative anemia due to acute blood loss 12/19/2020   History of CVA in adulthood 04/19/2020   History of ischemic multifocal posterior circulation stroke 02/29/2020   Difficulty hearing 06/28/2015   Arthritis, degenerative 06/28/2015   Paresthesia 06/28/2015   Purpura, nonthrombopenic ([place]) 06/28/2015   Adult hypothyroidism 08/09/2014   Migraine without aura and without status migrainosus, not intractable 09/19/2008   Moderate major depression ([place]) 09/07/2006   OP (osteoporosis) 09/07/2006    ONSET DATE: date of referral 03/11/2022   REFERRING DIAG: Z86.73 (ICD-10-CM) - Personal history of transient ischemic  attack (TIA), and cerebral infarction without residual deficits   THERAPY DIAG:  Cognitive communication deficit  Cerebrovascular accident (CVA) due to embolism of left middle cerebral artery ([place])  Aphasia  Personal history of transient ischemic attack (TIA), and cerebral infarction without residual deficits   Rationale for Evaluation and Treatment Rehabilitation  SUBJECTIVE:   SUBJECTIVE STATEMENT: Pt pleasant, good historian, eager Pt accompanied by: self  PERTINENT HISTORY:  Pt is a 72 year old female with history of multiple strokes, migraine, "Subjective versus Mild Cognitive Impairment (word finding difficulty, short-term recall), in patient with positive family history of Alzheimer's Disease, elevated p-tau, unremarkable SNFL," moderate non-positional obstructive sleep apnea, subjective upper extremity weakness, slight ataxia, imbalance, decreased sensatio to bilateral lower extremity, hyperreflexia as well as chemo radiation for endometrial cancer and recent radiation to her left breast. .   DIAGNOSTIC FINDINGS: ***  PAIN:  Are you having pain? No   FALLS: Has patient fallen in last 6 months?  No  LIVING ENVIRONMENT: Lives with: lives alone; has a roommate  Lives in: House/apartment  PLOF:  Level of assistance: Independent with ADLs, Independent with IADLs Employment: Retired   PATIENT GOALS to improve memory  OBJECTIVE:   COGNITIVE COMMUNICATION Overall cognitive status: {cognition:24006} Areas of impairment:  {ARMCcognitiveimpairment:28858} Functional Impairments: ***  AUDITORY COMPREHENSION  Overall auditory comprehension: Appears intact YES/NO questions: Appears intact Following directions: Appears intact Conversation: Complex Interfering components:  N/A Effective technique:  N/A  READING COMPREHENSION: Intact  EXPRESSION: verbal  VERBAL EXPRESSION:   Overall verbal expression: {IMPAIRED:25374} Level of generative/spontaneous verbalization:  {SLP level of generative/spontaneious verbalization:25435} Automatic speech: {SLP ATOMIC SPEECH:25434}  Repetition: {SLPrepetion:27212} Naming: {SLPnaming:27214} Pragmatics: {slppragmatics:27216} Comments: *** Interfering components: {SLP INTERFERING COMPONENTS:25436} Effective technique: {SLP EFFECTIVE TECHNIQUE:25437} Non-verbal means of communication: {SLP non verbal means of communication:25438}  WRITTEN EXPRESSION: Dominant hand: {RIGHT/LEFT:20294} Written expression: {slpwrittenexp:27209}  ORAL MOTOR EXAMINATION Facial : WFL Lingual: WFL Velum: WFL Mandible: WFL Cough: WFL Voice: WFL  MOTOR SPEECH: Overall motor speech: Appears intact Level of impairment:  N/A Respiration: diaphragmatic/abdominal breathing Phonation: normal Resonance: WFL Articulation: Appears intact Intelligibility: Intelligible Motor planning: Appears intact Motor speech errors:  N/A Interfering components:  N/A Effective technique:  N/A   STANDARDIZED ASSESSMENTS:  Cognitive Linguistic Quick Test  AGE - 70-89   The Cognitive Linguistic Quick Test (CLQT) was administered to assess the relative status of five cognitive domains: attention, memory, language, executive functioning, and visuospatial skills. Scores from 10 tasks were used to estimate severity ratings (standardized for age groups 18-69 years and 70-89 years) for each domain, a clock drawing task, as well as an overall composite severity rating of cognition.       Task Score Criterion Cut Scores  Personal Facts ***/8 8  Symbol Cancellation ***/12 10  Confrontation Naming ***/10 10  Clock Drawing  ***/13 11  Story Retelling ***/10 5  Symbol Trails ***/10 6  Generative Naming ***/9 4  Design Memory ***/6 4  Mazes  ***/8 4  Design Generation ***/13 5    Cognitive Domain Composite Score Severity Rating  Attention ***/215 {SLPseverity:25491}  Memory ***/185 {SLPseverity:25491}  Executive Function ***/40 {SLPseverity:25491}   Language ***/37 {SLPseverity:25491}  Visuospatial Skills ***/105 {SLPseverity:25491}  Clock Drawing  ***/13 {SLPseverity:25491}  Composite Severity Rating  {SLPseverity:25491}     PATIENT REPORTED OUTCOME MEASURES (PROM): {SLPPROM:27785}   TODAY'S TREATMENT:  N/A   PATIENT EDUCATION: Education details: results of this assessment and ST POC Person educated: Patient Education method: Explanation Education comprehension: verbalized understanding and needs further education     GOALS: Goals reviewed with patient? Yes  SHORT TERM GOALS: Target date: 10 sessions  *** Baseline: Goal status: {GOALSTATUS:25110}  2.  *** Baseline:  Goal status: {GOALSTATUS:25110}  3.  *** Baseline:  Goal status: {GOALSTATUS:25110}  4.  *** Baseline:  Goal status: {GOALSTATUS:25110}  5.  *** Baseline:  Goal status: {GOALSTATUS:25110}  6.  *** Baseline:  Goal status: {GOALSTATUS:25110}  LONG TERM GOALS: Target date:  *** Baseline:  Goal status: {GOALSTATUS:25110}  2.  *** Baseline:  Goal status: {GOALSTATUS:25110}  3.  *** Baseline:  Goal status: {GOALSTATUS:25110}  4.  *** Baseline:  Goal status: {GOALSTATUS:25110}  5.  *** Baseline:  Goal status: {GOALSTATUS:25110}  6.  *** Baseline:  Goal status: {GOALSTATUS:25110}  ASSESSMENT:  CLINICAL IMPRESSION: Patient is a *** y.o. *** who was seen today for ***.   OBJECTIVE IMPAIRMENTS include {SLPOBJIMP:27107}. These impairments are limiting patient from {SLPLIMIT:27108}. Factors affecting potential to achieve goals and functional outcome are {SLP factors:25450}.. Patient will benefit from skilled SLP services to address above impairments and improve overall function.  REHAB POTENTIAL: {rehabpotential:25112}  PLAN: SLP FREQUENCY: {rehab frequency:25116}  SLP DURATION: {rehab duration:25117}  PLANNED INTERVENTIONS: {SLP treatment/interventions:25449}   Teresita Fanton B. Rutherford Nail, M.S., CCC-SLP, Product manager Certified Brain Injury [place]  [place] Office [phone] Ascom [phone] Fax [phone]

## 2022-03-18 ENCOUNTER — Ambulatory Visit: Payer: Medicare Other | Admitting: Speech Pathology

## 2022-03-18 DIAGNOSIS — R41841 Cognitive communication deficit: Secondary | ICD-10-CM

## 2022-03-18 DIAGNOSIS — R2681 Unsteadiness on feet: Secondary | ICD-10-CM | POA: Diagnosis not present

## 2022-03-18 DIAGNOSIS — I63412 Cerebral infarction due to embolism of left middle cerebral artery: Secondary | ICD-10-CM | POA: Diagnosis not present

## 2022-03-18 DIAGNOSIS — M6281 Muscle weakness (generalized): Secondary | ICD-10-CM | POA: Diagnosis not present

## 2022-03-18 DIAGNOSIS — Z8673 Personal history of transient ischemic attack (TIA), and cerebral infarction without residual deficits: Secondary | ICD-10-CM | POA: Diagnosis not present

## 2022-03-18 DIAGNOSIS — R4701 Aphasia: Secondary | ICD-10-CM | POA: Diagnosis not present

## 2022-03-19 ENCOUNTER — Encounter: Payer: Self-pay | Admitting: Family Medicine

## 2022-03-19 NOTE — Therapy (Addendum)
OUTPATIENT SPEECH LANGUAGE PATHOLOGY TREATMENT NOTE   Patient Name: Natalie [name] MRN: [id number] DOB:[birth year], 72 y.o., female Today's Date: 03/19/2022  PCP: [name], MD REFERRING PROVIDER: Jennings Books, MD  END OF SESSION:   End of Session - 03/19/22 0835     Visit Number 2    Number of Visits 25    Date for SLP Re-Evaluation 06/01/22    Authorization Type Medicare A/Medicare B    Progress Note Due on Visit 10    SLP Start Time 1300    SLP Stop Time  1400    SLP Time Calculation (min) 60 min    Activity Tolerance Patient tolerated treatment well             Past Medical History:  Diagnosis Date   Acute bronchospasm due to viral infection 2020   Due to pollen   Acute ischemic left MCA stroke ([place]) 04/19/2020   a.) CTA head/neck 04/19/2020 --> nearly occlusive thrombus at bifurcation of a proximal M2 MCA branch approximately 6 mm from the MCA bifurcation. Subsequent occlusion of a proximal left M3 MCA branch   Allergy    Anemia    Angina at rest    Breast cancer, left ([place]) 08/21/2021   a.) stereotactic Bx 08/21/2021 --> IMC (G1, ER/PR +, Her2/neu -)   Cataract    a.) s/p extraction with IOL placement   Depressive disorder    Diverticulosis    Dyspnea    Epistaxis    Fatigue    Hearing loss    History of 2019 novel coronavirus disease (COVID-19) 12/18/2020   History of ischemic multifocal posterior circulation stroke 02/28/2020   a.) presented to ED with increasing episodes of  transient vertical binocular diplopia; MRI 02/27/2021 --> multiple punctate acute to subacute ischemic infarcts involving the cortical/subcortical aspects of both parieto-occipital regions   History of loop recorder    History of radiation therapy    Vagina-02/11/21-03/14/21- Dr. Gery Pray   HLD (hyperlipidemia)    Hypothyroidism    LAFB (left anterior fascicular block)    Long term current use of antithrombotics/antiplatelets    a.) on DAPT therapy (ASA + clopidogrel)    Memory change    Migraine    Osteoarthritis    Osteoporosis    Palpitations    Premature menopause    QT prolongation    Seizures ([place]) 1994   history of mini seizures, possible migraine induced   Stage I adenocarcinoma of endometrium ([place]) 12/18/2020   a.) stage 1A; grade 1 (pT1a, pN0); b.) s/p TLH/BSO 12/18/2020 + adjuvant vaginal brachytherapy (30 Gy over 5 fractions between 02/11/2021 - 03/14/2021)   Vitamin D deficiency    Past Surgical History:  Procedure Laterality Date   ABDOMINAL HYSTERECTOMY  12/18/2020   ABLATION  2006   AXILLARY SENTINEL NODE BIOPSY Left 09/25/2021   Procedure: AXILLARY SENTINEL NODE BIOPSY;  Surgeon: Robert Bellow, MD;  Location: [facility] ORS;  Service: General;  Laterality: Left;   BREAST BIOPSY Left 08/21/2021   Stereo Bx, X-clip; path --> IMC (G1, ER/PR +, Her2/neu -)   BREAST LUMPECTOMY WITH NEEDLE LOCALIZATION Left 09/25/2021   Procedure: BREAST LUMPECTOMY WITH NEEDLE LOCALIZATION;  Surgeon: Robert Bellow, MD;  Location: [facility];  Service: General;  Laterality: Left;   BUBBLE STUDY  04/23/2020   Procedure: BUBBLE STUDY;  Surgeon: Larey Dresser, MD;  Location: [facility] ENDOSCOPY;  Service: Cardiovascular;;   CATARACT EXTRACTION W/ INTRAOCULAR LENS IMPLANT Bilateral    COLONOSCOPY  COLONOSCOPY WITH PROPOFOL N/A 12/22/2018   Procedure: COLONOSCOPY WITH PROPOFOL;  Surgeon: Virgel Manifold, MD;  Location: [facility] ENDOSCOPY;  Service: Endoscopy;  Laterality: N/A;   EYE SURGERY  Spring 2018   Cataracts both eyes   HYSTEROSCOPY WITH D & C N/A 11/01/2020   Procedure: DILATATION AND CURETTAGE /HYSTEROSCOPY WITH MYOSURE;  Surgeon: Lafonda Mosses, MD;  Location: [facility]S;  Service: Gynecology;  Laterality: N/A;   LAPAROTOMY  12/18/2020   Procedure: LAPAROTOMY;  Surgeon: Lafonda Mosses, MD;  Location: [facility];  Service: Gynecology;;   LOOP RECORDER INSERTION N/A 04/23/2020   Procedure: LOOP RECORDER INSERTION;  Surgeon: Evans Lance, MD;   Location: [facility];  Service: Cardiovascular;  Laterality: N/A;   ROBOTIC ASSISTED TOTAL HYSTERECTOMY WITH BILATERAL SALPINGO OOPHERECTOMY Bilateral 12/18/2020   Procedure: XI ROBOTIC ASSISTED TOTAL HYSTERECTOMY WITH BILATERAL SALPINGO OOPHORECTOMY;  Surgeon: Lafonda Mosses, MD;  Location: [facility];  Service: Gynecology;  Laterality: Bilateral;   SENTINEL NODE BIOPSY N/A 12/18/2020   Procedure: SENTINEL NODE BIOPSY;  Surgeon: Lafonda Mosses, MD;  Location: [facility];  Service: Gynecology;  Laterality: N/A;   TEE WITHOUT CARDIOVERSION N/A 04/23/2020   Procedure: TRANSESOPHAGEAL ECHOCARDIOGRAM (TEE);  Surgeon: Larey Dresser, MD;  Location: [facility] ENDOSCOPY;  Service: Cardiovascular;  Laterality: N/A;   TONSILLECTOMY     Patient Active Problem List   Diagnosis Date Noted   Cerebrovascular accident (CVA) ([place]) 12/05/2021   Acute CVA (cerebrovascular accident) ([place]) 12/04/2021   Trigger finger, right ring finger 09/10/2021   Dupuytren's contracture of both hands 09/10/2021   Vitamin D deficiency 09/10/2021   Malignant neoplasm of left breast in female, estrogen receptor positive ([place]) 09/01/2021   Dyslipidemia 05/16/2021   Major depression, recurrent, chronic ([place]) 03/13/2021   Mild protein-calorie malnutrition ([place]) 03/13/2021   Endometrial cancer ([place]) 12/26/2020   Postoperative anemia due to acute blood loss 12/19/2020   History of CVA in adulthood 04/19/2020   History of ischemic multifocal posterior circulation stroke 02/29/2020   Difficulty hearing 06/28/2015   Arthritis, degenerative 06/28/2015   Paresthesia 06/28/2015   Purpura, nonthrombopenic ([place]) 06/28/2015   Adult hypothyroidism 08/09/2014   Migraine without aura and without status migrainosus, not intractable 09/19/2008   Moderate major depression ([place]) 09/07/2006   OP (osteoporosis) 09/07/2006    ONSET DATE: date of referral 03/11/2022    REFERRING DIAG: Z86.73 (ICD-10-CM) - Personal history of transient  ischemic attack (TIA), and cerebral infarction without residual deficits     PERTINENT HISTORY:  Pt is a 72 year old female with history of multiple strokes, migraine, "Subjective versus Mild Cognitive Impairment (word finding difficulty, short-term recall), in patient with positive family history of Alzheimer's Disease, elevated p-tau, unremarkable SNFL," moderate non-positional obstructive sleep apnea, subjective upper extremity weakness, slight ataxia, imbalance, decreased sensatio to bilateral lower extremity, hyperreflexia as well as chemo radiation for endometrial cancer and recent radiation to her left breast. .    DIAGNOSTIC FINDINGS:  12/05/2021 MRI   No acute infarction, hemorrhage, hydrocephalus, extra-axial collection or mass lesion. Small amount of scattered foci of T2 hyperintensity are seen within the white matter of the cerebral hemispheres and within the pons, nonspecific, most likely related to chronic small vessel ischemia. Remote cortical infarcts the left parietal and occipital lobes. Tiny remote infarct in the left thalamus.   07/17/2020 MRI There is no evidence of an acute infarct, mass, midline shift, or extra-axial fluid collection. Scattered small T2 hyperintensities in the cerebral white matter bilaterally are  nonspecific but compatible with mild chronic small vessel ischemic disease. There is mild encephalomalacia corresponding to the small posterior left MCA territory infarcts which were acute on the prior MRI. Chronic microhemorrhages are again noted posteriorly in both cerebral hemispheres. A chronic lacunar infarct in the left thalamus is unchanged from the prior MRI.   04/19/2020 MRI Generalized age appropriate cerebral volume. Mild chronic microvascular ischemic disease again noted.   Patchy small volume foci of restricted diffusion seen involving the cortical and subcortical aspect of the posterior left frontoparietal and temporal region,  consistent with posterior left MCA distribution infarcts. For reference purposes, the largest of these foci seen at the posterior left temporal region and measures 8 mm. Findings are embolic in distribution. No associated hemorrhage or mass effect.   Additional apparent subcentimeter focus of mild diffusion abnormality at the posterior right frontoparietal centrum semi ovale favored to reflect T2 shine through (series 2, image 34). No other evidence for acute or subacute ischemia. Gray-white matter differentiation otherwise maintained. No acute intracranial hemorrhage. Small focus of susceptibility artifact at the posterior right temporal region noted, consistent with a small chronic microhemorrhage. Additional minimal residual chronic blood products noted about the occipital regions related to recently identified ischemic infarcts.   02/28/2020 MRI Volume cerebral volume within normal limits for age. Minimal scattered patchy T2/FLAIR hyperintensity within the periventricular white matter and pons, most like related chronic microvascular ischemic disease, mild for age.   There are a few scattered punctate foci of diffusion abnormality seen involving the cortical/subcortical aspects of both parieto-occipital regions (series 5, images 29, 24, 20, 18), consistent with tiny acute to subacute ischemic infarcts. Associated minimal petechial hemorrhage (series 13, image 25). Subtle associated postcontrast enhancement consistent with subacute ischemia (series 32, images 113, 81). No malignant hemorrhagic transformation or significant regional mass effect. No evidence for subarachnoid hemorrhage within this region on prior head CT.   02/28/2020  Few scattered punctate acute to subacute ischemic infarcts involving the cortical/subcortical aspects of both parieto-occipital Regions consistent with tiny acute to subacute ischemic infarcts.  2. No other acute intracranial abnormality. 3.  Underlying mild chronic microvascular ischemic disease.  THERAPY DIAG:  Cognitive communication deficit  Rationale for Evaluation and Treatment Rehabilitation  SUBJECTIVE: Pt stated she thought her appt was at 2 instead of 1 so she was a few minutes late.   Pt accompanied by: self  PAIN:  Are you having pain? No  PATIENT GOALS: to improve memory   OBJECTIVE:   TODAY'S TREATMENT: Skilled treatment session focused on pt's language goals, specifically memory. SLP facilitated session by providing the following interventions:   Pt was administered the [employer] Learning Test:  The HVLT-R is a list learning test, which consists of 12 nouns within three semantic groups. The test has three learning trials in which the administrator reads the words aloud and then asks the patient to repeat as many as he/she can remember in any order. The three learning trials are used to calculate a Total Recall Score and Recognition Discrimination Index (RDI).  Immediate Recall: Trial 1: 7 (mean 5.8 / SD: 1.9)   Trial 2: 10 (mean 8.3 / SD: 2) Trial 3: 7 5 (mean 9.4 / SD: 2) Total Recall Score:  24 (mean 23.4/ SD 5.2)  Delayed Recognition True positive responses: 12  False-positive errors: N/A  Recognition Discrimination Index (RDI) - 12-0=12 (mean 11)  Pt's performance was within the average range   Pt was administer the Trail Making Test:  TRAIL MAKING  TEST (TMT) Parts A & B Both parts of the Trail Making Test consist of 25 circles distributed over a sheet of paper. In Part A, the circles are numbered 1 - 25, and the patient should draw lines to connect the numbers in ascending order. In Part B, the circles include both numbers (1 - 13) and letters (A - L); as in Part A, the patient draws lines to connect the circles in an ascending pattern, but with the added task of alternating between the numbers and letters (i.e., 1-A-2-B-3-C, etc.).   Results for both TMT A and B are reported as the number  of seconds required to complete the task; therefore, higher scores reveal greater impairment.  Results:  Trail A - WNL - 48 seconds (n=29 seconds; Deficient > 78 seconds) Trail B - WNL - 92 seconds (n= 75 seconds; Deficient > 273 seconds)  "TMTs have been shown to be significantly correlated with impaired driving on road tests by older drivers." "TMT-A provided the best utility for determining a range of scores (68-90 sec) for which additional road testing would be indicated in general practice settings."(Papandonatos GD, Deedra Ehrich, [address] [place]co PP, Carr DB. Clinical Utility of the Trail-Making Test as a Predictor of Driving Performance in Older Adults. [place] 2015 Nov;63(11):2358-64. doi: 10.1111/jgs.13776. Epub 2015 Oct 27. PMID: [id number]; [id number].)   SLP provided written and verbal education on the location of the pt's multiple strokes. Educating pt on where the strokes occured in the brain, signs and symptoms of a stroke, how location correlates to symptoms after a stroke, how deficits now seen could be due to strokes in various parts of the brain and how each lobe of the brain functions/what it is responsible for.    PATIENT EDUCATION: Education details: Pt educated on stroke location and potential deficits due to location of strokes.  Person educated: Patient Education method: Explanation, Verbal cues, and Handouts Education comprehension: verbalized understanding  HOME EXERCISE PROGRAM: N/A   GOALS: Goals reviewed with patient? Yes  SHORT TERM GOALS: Target date: 10 sessions  Pt will demonstrate comprehension of verbal information related to health conditions with 75% accuracy and moderate cues.  Baseline: Goal status: INITIAL  2.  Pt will use external aids to manage appointments, chores, shopping in 4 out 6 opportunities with moderate cues.  Baseline:  Goal status: INITIAL  3.  Pt will complete moderately complex reasoning tasks with 75% accuracy in a  reasonable amount of time with double checking, given moderate cues.  Baseline:  Goal status: INITIAL   LONG TERM GOALS: Target date: 06/01/2022  Pt will use strategies to improve memory for important information with 75% acc. with Mod I (ie., white board, daily planner/calendar, Apps on phone).  Baseline:  Goal status: INITIAL  2.   With Mod I, Patient will demonstrate anticipatory awareness by identifying cognitive-communication barriers and using appropriate compensatory strategies in 75% of opportunities.  Baseline:  Goal status: INITIAL  3.  Pt will complete moderately complex reasoning tasks with > 90% accuracy in a reasonable amount of time with double checking, with use of strategies.  Baseline:  Goal status: INITIAL   ASSESSMENT:  CLINICAL IMPRESSION: While patient presents with normal cognitive on the above formal assessments, she presents with functional memory deficits as evidenced inability to recall accurate time of today's appointment resulting in pt arriving late. Continue to recommend skilled intervention for training in compensatory cognitive communication strategies.   OBJECTIVE IMPAIRMENTS  include attention, memory,  and executive functioning. These impairments are limiting patient from managing medications, managing appointments, managing finances, household responsibilities, ADLs/IADLs, and effectively communicating at home and in community. Factors affecting potential to achieve goals and functional outcome are  needs further assessment .Marland Kitchen Patient will benefit from skilled SLP services to address above impairments and improve overall function.   REHAB POTENTIAL: Good  PLAN: SLP FREQUENCY: 1-2x/week  SLP DURATION: 12 weeks  PLANNED INTERVENTIONS: Cueing hierachy, Cognitive reorganization, Internal/external aids, Functional tasks, SLP instruction and feedback, Compensatory strategies, and Patient/family education   Alphonzo Grieve, SLP Graduate Clinician     Happi B. Rutherford Nail, M.S., CCC-SLP, Mining engineer Certified Brain Injury [place]  [place] Office [phone] Ascom [phone] Fax [phone]

## 2022-03-20 ENCOUNTER — Ambulatory Visit: Payer: Medicare Other | Admitting: Speech Pathology

## 2022-03-20 DIAGNOSIS — Z8673 Personal history of transient ischemic attack (TIA), and cerebral infarction without residual deficits: Secondary | ICD-10-CM | POA: Diagnosis not present

## 2022-03-20 DIAGNOSIS — R2681 Unsteadiness on feet: Secondary | ICD-10-CM | POA: Diagnosis not present

## 2022-03-20 DIAGNOSIS — I63412 Cerebral infarction due to embolism of left middle cerebral artery: Secondary | ICD-10-CM | POA: Diagnosis not present

## 2022-03-20 DIAGNOSIS — R4701 Aphasia: Secondary | ICD-10-CM | POA: Diagnosis not present

## 2022-03-20 DIAGNOSIS — R41841 Cognitive communication deficit: Secondary | ICD-10-CM | POA: Diagnosis not present

## 2022-03-20 DIAGNOSIS — M6281 Muscle weakness (generalized): Secondary | ICD-10-CM | POA: Diagnosis not present

## 2022-03-20 NOTE — Therapy (Addendum)
OUTPATIENT SPEECH LANGUAGE PATHOLOGY TREATMENT NOTE   Patient Name: Natalie [name] MRN: [id number] DOB:10-10-1950, 72 y.o., female Today's Date: 03/20/2022  PCP: [name], MD REFERRING PROVIDER: Jennings Books, MD  END OF SESSION:   End of Session - 03/20/22 1540     Visit Number 3    Number of Visits 25    Date for SLP Re-Evaluation 06/01/22    Authorization Type Medicare A/Medicare B    Progress Note Due on Visit 10    SLP Start Time 1400    SLP Stop Time  1500    SLP Time Calculation (min) 60 min    Activity Tolerance Patient tolerated treatment well             Past Medical History:  Diagnosis Date   Acute bronchospasm due to viral infection 2020   Due to pollen   Acute ischemic left MCA stroke ([place]) 04/19/2020   a.) CTA head/neck 04/19/2020 --> nearly occlusive thrombus at bifurcation of a proximal M2 MCA branch approximately 6 mm from the MCA bifurcation. Subsequent occlusion of a proximal left M3 MCA branch   Allergy    Anemia    Angina at rest    Breast cancer, left ([place]) 08/21/2021   a.) stereotactic Bx 08/21/2021 --> IMC (G1, ER/PR +, Her2/neu -)   Cataract    a.) s/p extraction with IOL placement   Depressive disorder    Diverticulosis    Dyspnea    Epistaxis    Fatigue    Hearing loss    History of 2019 novel coronavirus disease (COVID-19) 12/18/2020   History of ischemic multifocal posterior circulation stroke 02/28/2020   a.) presented to ED with increasing episodes of  transient vertical binocular diplopia; MRI 02/27/2021 --> multiple punctate acute to subacute ischemic infarcts involving the cortical/subcortical aspects of both parieto-occipital regions   History of loop recorder    History of radiation therapy    Vagina-02/11/21-03/14/21- Dr. Gery Pray   HLD (hyperlipidemia)    Hypothyroidism    LAFB (left anterior fascicular block)    Long term current use of antithrombotics/antiplatelets    a.) on DAPT therapy (ASA + clopidogrel)    Memory change    Migraine    Osteoarthritis    Osteoporosis    Palpitations    Premature menopause    QT prolongation    Seizures ([place]) 1994   history of mini seizures, possible migraine induced   Stage I adenocarcinoma of endometrium ([place]) 12/18/2020   a.) stage 1A; grade 1 (pT1a, pN0); b.) s/p TLH/BSO 12/18/2020 + adjuvant vaginal brachytherapy (30 Gy over 5 fractions between 02/11/2021 - 03/14/2021)   Vitamin D deficiency    Past Surgical History:  Procedure Laterality Date   ABDOMINAL HYSTERECTOMY  12/18/2020   ABLATION  2006   AXILLARY SENTINEL NODE BIOPSY Left 09/25/2021   Procedure: AXILLARY SENTINEL NODE BIOPSY;  Surgeon: Robert Bellow, MD;  Location: [facility] OR[facility];  Service: General;  Laterality: Left;   BREAST BIOPSY Left 08/21/2021   Stereo Bx, X-clip; path --> IMC (G1, ER/PR +, Her2/neu -)   BREAST LUMPECTOMY WITH NEEDLE LOCALIZATION Left 09/25/2021   Procedure: BREAST LUMPECTOMY WITH NEEDLE LOCALIZATION;  Surgeon: Robert Bellow, MD;  Location: [facility];  Service: General;  Laterality: Left;   BUBBLE STUDY  04/23/2020   Procedure: BUBBLE STUDY;  Surgeon: Larey Dresser, MD;  Location: [facility];  Service: Cardiovascular;;   CATARACT EXTRACTION W/ INTRAOCULAR LENS IMPLANT Bilateral    COLONOSCOPY  COLONOSCOPY WITH PROPOFOL N/A 12/22/2018   Procedure: COLONOSCOPY WITH PROPOFOL;  Surgeon: Virgel Manifold, MD;  Location: [facility] ENDOSCOPY;  Service: Endoscopy;  Laterality: N/A;   EYE SURGERY  Spring 2018   Cataracts both eyes   HYSTEROSCOPY WITH D & C N/A 11/01/2020   Procedure: DILATATION AND CURETTAGE /HYSTEROSCOPY WITH MYOSURE;  Surgeon: Lafonda Mosses, MD;  Location: [facility];  Service: Gynecology;  Laterality: N/A;   LAPAROTOMY  12/18/2020   Procedure: LAPAROTOMY;  Surgeon: Lafonda Mosses, MD;  Location: [facility];  Service: Gynecology;;   LOOP RECORDER INSERTION N/A 04/23/2020   Procedure: LOOP RECORDER INSERTION;  Surgeon: Evans Lance, MD;   Location: [facility];  Service: Cardiovascular;  Laterality: N/A;   ROBOTIC ASSISTED TOTAL HYSTERECTOMY WITH BILATERAL SALPINGO OOPHERECTOMY Bilateral 12/18/2020   Procedure: XI ROBOTIC ASSISTED TOTAL HYSTERECTOMY WITH BILATERAL SALPINGO OOPHORECTOMY;  Surgeon: Lafonda Mosses, MD;  Location: [facility];  Service: Gynecology;  Laterality: Bilateral;   SENTINEL NODE BIOPSY N/A 12/18/2020   Procedure: SENTINEL NODE BIOPSY;  Surgeon: Lafonda Mosses, MD;  Location: [facility];  Service: Gynecology;  Laterality: N/A;   TEE WITHOUT CARDIOVERSION N/A 04/23/2020   Procedure: TRANSESOPHAGEAL ECHOCARDIOGRAM (TEE);  Surgeon: Larey Dresser, MD;  Location: [facility] ENDOSCOPY;  Service: Cardiovascular;  Laterality: N/A;   TONSILLECTOMY     Patient Active Problem List   Diagnosis Date Noted   Cerebrovascular accident (CVA) ([place]) 12/05/2021   Acute CVA (cerebrovascular accident) ([place]) 12/04/2021   Trigger finger, right ring finger 09/10/2021   Dupuytren's contracture of both hands 09/10/2021   Vitamin D deficiency 09/10/2021   Malignant neoplasm of left breast in female, estrogen receptor positive ([place]) 09/01/2021   Dyslipidemia 05/16/2021   Major depression, recurrent, chronic ([place]) 03/13/2021   Mild protein-calorie malnutrition ([place]) 03/13/2021   Endometrial cancer ([place]) 12/26/2020   Postoperative anemia due to acute blood loss 12/19/2020   History of CVA in adulthood 04/19/2020   History of ischemic multifocal posterior circulation stroke 02/29/2020   Difficulty hearing 06/28/2015   Arthritis, degenerative 06/28/2015   Paresthesia 06/28/2015   Purpura, nonthrombopenic ([place]) 06/28/2015   Adult hypothyroidism 08/09/2014   Migraine without aura and without status migrainosus, not intractable 09/19/2008   Moderate major depression ([place]) 09/07/2006   OP (osteoporosis) 09/07/2006    ONSET DATE: date of referral 03/11/2022    REFERRING DIAG: Z86.73 (ICD-10-CM) - Personal history of transient  ischemic attack (TIA), and cerebral infarction without residual deficits     PERTINENT HISTORY:  Pt is a 72 year old female with history of multiple strokes, migraine, "Subjective versus Mild Cognitive Impairment (word finding difficulty, short-term recall), in patient with positive family history of Alzheimer's Disease, elevated p-tau, unremarkable SNFL," moderate non-positional obstructive sleep apnea, subjective upper extremity weakness, slight ataxia, imbalance, decreased sensatio to bilateral lower extremity, hyperreflexia as well as chemo radiation for endometrial cancer and recent radiation to her left breast. .    DIAGNOSTIC FINDINGS:  12/05/2021 MRI   No acute infarction, hemorrhage, hydrocephalus, extra-axial collection or mass lesion. Small amount of scattered foci of T2 hyperintensity are seen within the white matter of the cerebral hemispheres and within the pons, nonspecific, most likely related to chronic small vessel ischemia. Remote cortical infarcts the left parietal and occipital lobes. Tiny remote infarct in the left thalamus.   07/17/2020 MRI There is no evidence of an acute infarct, mass, midline shift, or extra-axial fluid collection. Scattered small T2 hyperintensities in the cerebral white matter bilaterally are  nonspecific but compatible with mild chronic small vessel ischemic disease. There is mild encephalomalacia corresponding to the small posterior left MCA territory infarcts which were acute on the prior MRI. Chronic microhemorrhages are again noted posteriorly in both cerebral hemispheres. A chronic lacunar infarct in the left thalamus is unchanged from the prior MRI.   04/19/2020 MRI Generalized age appropriate cerebral volume. Mild chronic microvascular ischemic disease again noted.   Patchy small volume foci of restricted diffusion seen involving the cortical and subcortical aspect of the posterior left frontoparietal and temporal region,  consistent with posterior left MCA distribution infarcts. For reference purposes, the largest of these foci seen at the posterior left temporal region and measures 8 mm. Findings are embolic in distribution. No associated hemorrhage or mass effect.   Additional apparent subcentimeter focus of mild diffusion abnormality at the posterior right frontoparietal centrum semi ovale favored to reflect T2 shine through (series 2, image 34). No other evidence for acute or subacute ischemia. Gray-white matter differentiation otherwise maintained. No acute intracranial hemorrhage. Small focus of susceptibility artifact at the posterior right temporal region noted, consistent with a small chronic microhemorrhage. Additional minimal residual chronic blood products noted about the occipital regions related to recently identified ischemic infarcts.   02/28/2020 MRI Volume cerebral volume within normal limits for age. Minimal scattered patchy T2/FLAIR hyperintensity within the periventricular white matter and pons, most like related chronic microvascular ischemic disease, mild for age.   There are a few scattered punctate foci of diffusion abnormality seen involving the cortical/subcortical aspects of both parieto-occipital regions (series 5, images 29, 24, 20, 18), consistent with tiny acute to subacute ischemic infarcts. Associated minimal petechial hemorrhage (series 13, image 25). Subtle associated postcontrast enhancement consistent with subacute ischemia (series 32, images 113, 81). No malignant hemorrhagic transformation or significant regional mass effect. No evidence for subarachnoid hemorrhage within this region on prior head CT.   02/28/2020  Few scattered punctate acute to subacute ischemic infarcts involving the cortical/subcortical aspects of both parieto-occipital Regions consistent with tiny acute to subacute ischemic infarcts.  2. No other acute intracranial abnormality. 3.  Underlying mild chronic microvascular ischemic disease.  THERAPY DIAG:  Cognitive communication deficit  Rationale for Evaluation and Treatment Rehabilitation  SUBJECTIVE: Pt in a pleasant mood.   Pt accompanied by: self  PAIN:  Are you having pain? No  PATIENT GOALS: to improve memory   OBJECTIVE:   TODAY'S TREATMENT: Skilled treatment session focused on pt's language goals, specifically memory. SLP facilitated session by providing the following interventions:   SLP provided education on the relationship between attention and cognition/memory regarding pts concerns with a recent scheduling of a neuro psych appointment.  Pt stated she utilized memory strategies on her phone (reminders, calendars, Alexa). Pt using strategies inefficiently. SLP provided assistance in adjusting her calendar settings to provide a better alert sound. Pt stated she "must commit things to memory or she forgets them," causing her calendar to occasionally be ineffective.  SLP provided recommendations on apps for pts phone to target memory, attention, executive functioning.    PATIENT EDUCATION: Education details: Pt educated on stroke location and potential deficits due to location of strokes.  Person educated: Patient Education method: Explanation, Verbal cues, and Handouts Education comprehension: verbalized understanding  HOME EXERCISE PROGRAM: N/A   GOALS: Goals reviewed with patient? Yes  SHORT TERM GOALS: Target date: 10 sessions  Pt will demonstrate comprehension of verbal information related to health conditions with 75% accuracy and moderate cues.  Baseline: Goal status:  INITIAL  2.  Pt will use external aids to manage appointments, chores, shopping in 4 out 6 opportunities with moderate cues.  Baseline:  Goal status: INITIAL  3.  Pt will complete moderately complex reasoning tasks with 75% accuracy in a reasonable amount of time with double checking, given moderate cues.  Baseline:   Goal status: INITIAL   LONG TERM GOALS: Target date: 06/01/2022  Pt will use strategies to improve memory for important information with 75% acc. with Mod I (ie., white board, daily planner/calendar, Apps on phone).  Baseline:  Goal status: INITIAL  2.   With Mod I, Patient will demonstrate anticipatory awareness by identifying cognitive-communication barriers and using appropriate compensatory strategies in 75% of opportunities.  Baseline:  Goal status: INITIAL  3.  Pt will complete moderately complex reasoning tasks with > 90% accuracy in a reasonable amount of time with double checking, with use of strategies.  Baseline:  Goal status: INITIAL   ASSESSMENT:  CLINICAL IMPRESSION: While pt reports use of compensatory strategies, she also reports these strategies are ineffective as external memory aids.   OBJECTIVE IMPAIRMENTS  include attention, memory, and executive functioning. These impairments are limiting patient from managing medications, managing appointments, managing finances, household responsibilities, ADLs/IADLs, and effectively communicating at home and in community. Factors affecting potential to achieve goals and functional outcome are  needs further assessment .Marland Kitchen Patient will benefit from skilled SLP services to address above impairments and improve overall function.   REHAB POTENTIAL: Good  PLAN: SLP FREQUENCY: 1-2x/week  SLP DURATION: 12 weeks  PLANNED INTERVENTIONS: Cueing hierachy, Cognitive reorganization, Internal/external aids, Functional tasks, SLP instruction and feedback, Compensatory strategies, and Patient/family education   Alphonzo Grieve, SLP Graduate Clinician     Happi B. Rutherford Nail, M.S., CCC-SLP, Mining engineer Certified Brain Injury [place]  [place] Office [phone] Ascom [phone] Fax [phone]

## 2022-03-21 ENCOUNTER — Encounter: Payer: Self-pay | Admitting: Gynecologic Oncology

## 2022-03-21 ENCOUNTER — Inpatient Hospital Stay: Payer: Medicare Other | Attending: Gynecologic Oncology | Admitting: Gynecologic Oncology

## 2022-03-21 VITALS — BP 129/75 | HR 77 | Temp 98.3°F | Resp 14 | Wt 149.6 lb

## 2022-03-21 DIAGNOSIS — Z923 Personal history of irradiation: Secondary | ICD-10-CM | POA: Insufficient documentation

## 2022-03-21 DIAGNOSIS — Z90710 Acquired absence of both cervix and uterus: Secondary | ICD-10-CM | POA: Insufficient documentation

## 2022-03-21 DIAGNOSIS — Z8542 Personal history of malignant neoplasm of other parts of uterus: Secondary | ICD-10-CM | POA: Diagnosis not present

## 2022-03-21 DIAGNOSIS — Z90722 Acquired absence of ovaries, bilateral: Secondary | ICD-10-CM | POA: Diagnosis not present

## 2022-03-21 DIAGNOSIS — C541 Malignant neoplasm of endometrium: Secondary | ICD-10-CM

## 2022-03-21 NOTE — Patient Instructions (Signed)
It was good to see you today.  I do not see or feel any evidence of cancer recurrence on your exam.  I will see you for follow-up in 6 months.  Please call back in June or July to schedule visit to see me in mid August.  As always, if you develop any new and concerning symptoms before your next visit, please call to see me sooner.

## 2022-03-21 NOTE — Progress Notes (Signed)
Gynecologic Oncology Return Clinic Visit  03/21/22  Reason for Visit: Surveillance in the setting of uterine cancer history   Treatment History: Oncology History Overview Note  MMR IHC normal   Endometrial cancer ([place])  04/2020 Imaging   CT A/P in 04/2020 showed enlarged fibroid uterus; complex cystic mass in left ovary measuring up to 5cm. Pelvic ultrasound 05/01/20: Enlarged multi-fibroid uterus, endometrium 3.59m. Left ovary measures up to 7cm with a 5.8 x 4.1 x 4cm dominant septated cyst.  CA-125   04/2020 Tumor Marker   Patient's tumor was tested for the following markers: CA-125. Results of the tumor marker test revealed 592.   08/10/2020 Imaging   Pelvic ultrasound showed thickened heterogenous endometrial complex with somewhat nodular margins associated with a 3.8 cm diameter fluid collection within the endometrial canal.  8.2 cm cyst of the left ovary.   08/2020 Tumor Marker   Patient's tumor was tested for the following markers: CA-125. Results of the tumor marker test revealed 731.   08/27/2020 Initial Biopsy   EMB showing minute fragments of benign inactive endometrium, mostly blood and fibrin   11/01/2020 Surgery   D&C hysteroscopy, and hysteroscopic sampling of the endometrium.    Final pathology showed at least complex atypical hyperplasia.   11/29/2020 Imaging   Pelvic ultrasound shows thickened endometrial lining measuring 4.4 cm.  Normal-appearing right ovary.  Complex left adnexal cyst measures up to f 5.3 cm.  No flow seen   12/18/2020 Surgery   TRH/BSO, SLN injection with biopsy on let (no mapping on right), mini-lap for specimen removal, repair vaginal lacerations  Findings: On EUA, large globular uterus. On intra-abdominal entry, normal upper abdominal survey.Normal omentum, small and large bowel. Uterus 10cm, very globular and bulbous at the fundus. Multiple fibroids including 3cm lower uterine segment anterior fibroid and posterior 6cm fundal fibroid. Right  adnexa normal and atrophic. Mapping to channels along the right broad ligament, no obvious SLN. On the left, mapping successful to external iliac SLN. Left ovary replaced by 6cm cyst adherent to the broad ligament and sigmoid mesentery with old blood versus necrotic tissue within (cyst wall very easily ripped open with minimal manipulation of the adnexa. Second 2cm cystic component. No obvious findings of malignancy but would be stage II if cancer given adherence to surrounding pelvic structures. Some adhesions between the bladder and cervix. No obvious adenopathy. No ascites. Mini-lap required for specimen removal given size.  No frozen sent as it was not going to impact management. Goal was to keep the surgery under or close to 2 hours.   12/18/2020 Pathology Results   A. LYMPH NODE, LEFT EXTERNAL ILIAC SENTINAL, BIOPSY:  - One lymph node, negative for malignancy (0/1).   B. UTERUS, CERVIX,  BILATERAL TUBES, HYSTERECTOMY AND BILATERAL  SALPINGECTOMY:  - Endometrium:       - Endometrioid endometrial adenocarcinoma, low-grade, arising in a  background of endometrioid intraepithelial neoplasia (EIN), with  extensive colonization of adenomyosis.       - See oncology table.  - Myometrium:       - Adenomyosis, involved by endometrioid endometrial adenocarcinoma.       - Leiomyomata uteri.  - Uterine cervix:       - Benign transformation zone.       - Negative for squamous intraepithelial lesion and malignancy.  - Fallopian tubes:       - No significant histopathologic change.  - Ovaries:       - Benign physiologic changes.   COMMENT:  A. An immunohistochemical study for pancytokeratin is negative. There is  no evidence of metastatic carcinoma.   B. Immunohistochemical studies show the tumor cells to be positive for  PR, and negative for Napsin A.  These findings would favor the above  diagnosis, and suggest against clear cell carcinoma. The definite extent  of myometrial invasion is  less than 50%, but cannot be further  characterized, due to initial sampling of the entire myometrial surface,  and extensive colonization of adenomyosis throughout the myometrium  impacting evaluation of the orientation of these fragments. Full  thickness sections are without definite direct myometrial invasion, but  show extensive adenomyosis   UTERUS, CARCINOMA OR CARCINOSARCOMA: Resection   Procedure: Total hysterectomy with bilateral salpingo-oophorectomy  Histologic Type: Endometrioid carcinoma, NOS  Histologic Grade: Low-grade  Myometrial Invasion:       Cannot be determined  Uterine Serosa Involvement: Not identified  Cervical stromal Involvement: Not identified  Extent of involvement of other tissue/organs: Not identified  Peritoneal/Ascitic Fluid: Not submitted/unknown  Lymphovascular Invasion: Not identified  Regional Lymph Nodes: Regional lymph nodes present  All regional lymph nodes negative for tumor cells       Pelvic Lymph Nodes Examined:  Sentinel: 1               Non-sentinel: 0               Total: 1       Pelvic Lymph Nodes with Metastasis: 0                          Macrometastasis: (>2.0 mm): 0                          Micrometastasis: (>0.2 mm and < 2.0 mm): 0                          Isolated Tumor Cells (<0.2 mm): 0                          Laterality of Lymph Node with Tumor: Not  applicable                             Extracapsular Extension: Not applicable    [id number] Initial Diagnosis   Endometrial cancer ([place])   02/11/2021 - 03/14/2021 Radiation Therapy   02/11/2021 through 03/14/2021 Site Technique Total Dose (Gy) Dose per Fx (Gy) Completed Fx Beam Energies  Vagina: Pelvis HDR-brachy 30/30 6 5/5 Ir-192       Malignant neoplasm of left breast in female, estrogen receptor positive ([place])  09/01/2021 Initial Diagnosis   Malignant neoplasm of left breast in female, estrogen receptor positive ([place])   10/02/2021 Cancer Staging   Staging form: Breast,  AJCC 8th Edition - Pathologic stage from 10/02/2021: Stage IA (pT1a, pN0, cM0, G1, ER+, PR+, HER2-) - Signed by Sindy Guadeloupe, MD on 10/02/2021 Multigene prognostic tests performed: None Histologic grading system: 3 grade system     Interval History: She had a stroke on November 2023.  She saw Dr. Sondra Come last in late October.  Overall, doing well.  Has some minor memory deficits that she still notes since her stroke.  Is working with speech therapy.  Has overall recovered very well.  Denies any vaginal bleeding or discharge.  Has occasional abdominal  twinges, no significant change.  Reports intermittent constipation, has diarrhea today secondary to what she ate yesterday.  Past Medical/Surgical History: Past Medical History:  Diagnosis Date   Acute bronchospasm due to viral infection 2020   Due to pollen   Acute ischemic left MCA stroke ([place]) 04/19/2020   a.) CTA head/neck 04/19/2020 --> nearly occlusive thrombus at bifurcation of a proximal M2 MCA branch approximately 6 mm from the MCA bifurcation. Subsequent occlusion of a proximal left M3 MCA branch   Allergy    Anemia    Angina at rest    Breast cancer, left ([place]) 08/21/2021   a.) stereotactic Bx 08/21/2021 --> IMC (G1, ER/PR +, Her2/neu -)   Cataract    a.) s/p extraction with IOL placement   Depressive disorder    Diverticulosis    Dyspnea    Epistaxis    Fatigue    Hearing loss    History of 2019 novel coronavirus disease (COVID-19) 12/18/2020   History of ischemic multifocal posterior circulation stroke 02/28/2020   a.) presented to ED with increasing episodes of  transient vertical binocular diplopia; MRI 02/27/2021 --> multiple punctate acute to subacute ischemic infarcts involving the cortical/subcortical aspects of both parieto-occipital regions   History of loop recorder    History of radiation therapy    Vagina-02/11/21-03/14/21- Dr. Gery Pray   HLD (hyperlipidemia)    Hypothyroidism    LAFB (left anterior  fascicular block)    Long term current use of antithrombotics/antiplatelets    a.) on DAPT therapy (ASA + clopidogrel)   Memory change    Migraine    Osteoarthritis    Osteoporosis    Palpitations    Premature menopause    QT prolongation    Seizures ([place]) 1994   history of mini seizures, possible migraine induced   Stage I adenocarcinoma of endometrium ([place]) 12/18/2020   a.) stage 1A; grade 1 (pT1a, pN0); b.) s/p TLH/BSO 12/18/2020 + adjuvant vaginal brachytherapy (30 Gy over 5 fractions between 02/11/2021 - 03/14/2021)   Vitamin D deficiency     Past Surgical History:  Procedure Laterality Date   ABDOMINAL HYSTERECTOMY  12/18/2020   ABLATION  2006   AXILLARY SENTINEL NODE BIOPSY Left 09/25/2021   Procedure: AXILLARY SENTINEL NODE BIOPSY;  Surgeon: Robert Bellow, MD;  Location: [facility];  Service: General;  Laterality: Left;   BREAST BIOPSY Left 08/21/2021   Stereo Bx, X-clip; path --> IMC (G1, ER/PR +, Her2/neu -)   BREAST LUMPECTOMY WITH NEEDLE LOCALIZATION Left 09/25/2021   Procedure: BREAST LUMPECTOMY WITH NEEDLE LOCALIZATION;  Surgeon: Robert Bellow, MD;  Location: [facility];  Service: General;  Laterality: Left;   BUBBLE STUDY  04/23/2020   Procedure: BUBBLE STUDY;  Surgeon: Larey Dresser, MD;  Location: [facility];  Service: Cardiovascular;;   CATARACT EXTRACTION W/ INTRAOCULAR LENS IMPLANT Bilateral    COLONOSCOPY     COLONOSCOPY WITH PROPOFOL N/A 12/22/2018   Procedure: COLONOSCOPY WITH PROPOFOL;  Surgeon: Virgel Manifold, MD;  Location: [facility];  Service: Endoscopy;  Laterality: N/A;   EYE SURGERY  Spring 2018   Cataracts both eyes   HYSTEROSCOPY WITH D & C N/A 11/01/2020   Procedure: DILATATION AND CURETTAGE /HYSTEROSCOPY WITH MYOSURE;  Surgeon: Lafonda Mosses, MD;  Location: [facility];  Service: Gynecology;  Laterality: N/A;   LAPAROTOMY  12/18/2020   Procedure: LAPAROTOMY;  Surgeon: Lafonda Mosses, MD;  Location: [facility];  Service:  Gynecology;;   LOOP RECORDER INSERTION N/A 04/23/2020  Procedure: LOOP RECORDER INSERTION;  Surgeon: Evans Lance, MD;  Location: [facility];  Service: Cardiovascular;  Laterality: N/A;   ROBOTIC ASSISTED TOTAL HYSTERECTOMY WITH BILATERAL SALPINGO OOPHERECTOMY Bilateral 12/18/2020   Procedure: XI ROBOTIC ASSISTED TOTAL HYSTERECTOMY WITH BILATERAL SALPINGO OOPHORECTOMY;  Surgeon: Lafonda Mosses, MD;  Location: [facility];  Service: Gynecology;  Laterality: Bilateral;   SENTINEL NODE BIOPSY N/A 12/18/2020   Procedure: SENTINEL NODE BIOPSY;  Surgeon: Lafonda Mosses, MD;  Location: [facility];  Service: Gynecology;  Laterality: N/A;   TEE WITHOUT CARDIOVERSION N/A 04/23/2020   Procedure: TRANSESOPHAGEAL ECHOCARDIOGRAM (TEE);  Surgeon: Larey Dresser, MD;  Location: [facility];  Service: Cardiovascular;  Laterality: N/A;   TONSILLECTOMY      Family History  Problem Relation Age of Onset   Early death Father        suicide   Depression Father    Diabetes Father    Cancer Father        prostate   Hearing loss Father        due to war   Alzheimer's disease Mother    Heart disease Brother    Atrial fibrillation Brother    Heart disease Maternal Aunt    Dementia Maternal Aunt    Heart disease Maternal Uncle    Dementia Maternal Grandmother    Heart disease Brother    Atrial fibrillation Brother    Colon cancer Neg Hx    Breast cancer Neg Hx    Ovarian cancer Neg Hx    Pancreatic cancer Neg Hx    Endometrial cancer Neg Hx     Social History   Socioeconomic History   Marital status: Single    Spouse name: Not on file   Number of children: 1   Years of education: Not on file   Highest education level: Bachelor's degree (e.g., BA, AB, BS)  Occupational History   Occupation: retired   Tobacco Use   Smoking status: Former    Packs/day: 0.50    Years: 6.00    Total pack years: 3.00    Types: Cigarettes    Quit date: 02/11/1974    Years since quitting: 48.1    Smokeless tobacco: Never  Vaping Use   Vaping Use: Never used  Substance and Sexual Activity   Alcohol use: Not Currently   Drug use: Never   Sexual activity: Not Currently    Comment: Don't use not sexually active  Other Topics Concern   Not on file  Social History Narrative   Raised an adopted child on her own   Working part time reviewed documents   Social Determinants of Health   Financial Resource Strain: [place]  (04/02/2021)   Overall Financial Resource Strain (CARDIA)    Difficulty of Paying Living Expenses: Not hard at all  Food Insecurity: No [place] (12/05/2021)   Hunger Vital Sign    Worried About Running Out of Food in the Last Year: Never true    [place] in the Last Year: Never true  Transportation Needs: No Transportation Needs (12/05/2021)   PRAPARE - Hydrologist (Medical): No    Lack of Transportation (Non-Medical): No  Physical Activity: Insufficiently Active (04/02/2021)   Exercise Vital Sign    Days of Exercise per Week: 2 days    Minutes of Exercise per Session: 30 min  Stress: No Stress Concern Present (04/02/2021)   [place]  Questionnaire    Feeling of Stress : Not at all  Social Connections: Moderately Integrated (04/02/2021)   Social Connection and Isolation Panel [NHANES]    Frequency of Communication with Friends and Family: More than three times a week    Frequency of Social Gatherings with Friends and Family: Twice a week    Attends Religious Services: More than 4 times per year    Active Member of [employer] or Organizations: Yes    Attends Music therapist: More than 4 times per year    Marital Status: Never married    Current Medications:  Current Outpatient Medications:    alendronate (FOSAMAX) 70 MG tablet, TAKE 1 TABLET(70 MG) BY MOUTH 1 TIME A WEEK WITH A FULL GLASS OF WATER AND ON AN EMPTY STOMACH, Disp: 4 tablet, Rfl: 5   aspirin  81 MG EC tablet, Take by mouth., Disp: , Rfl:    aspirin-acetaminophen-caffeine (EXCEDRIN MIGRAINE) [id number] MG tablet, Take by mouth every 6 (six) hours as needed for headache or migraine., Disp: , Rfl:    atorvastatin (LIPITOR) 40 MG tablet, Take 1 tablet (40 mg total) by mouth daily., Disp: 90 tablet, Rfl: 1   buPROPion (WELLBUTRIN SR) 150 MG 12 hr tablet, Take 1 tablet (150 mg total) by mouth 2 (two) times daily., Disp: 180 tablet, Rfl: 1   citalopram (CELEXA) 40 MG tablet, TAKE 1 TABLET BY MOUTH EVERY DAY, Disp: 90 tablet, Rfl: 1   clopidogrel (PLAVIX) 75 MG tablet, Take 1 tablet (75 mg total) by mouth daily., Disp: 1 tablet, Rfl: 0   Cyanocobalamin (VITAMIN B-12) 1000 MCG SUBL, Place 1 tablet (1,000 mcg total) under the tongue 2 (two) times a week., Disp: 30 tablet, Rfl: 0   levothyroxine (SYNTHROID) 75 MCG tablet, TAKE 1 TABLET(75 MCG) BY MOUTH DAILY BEFORE BREAKFAST, Disp: 90 tablet, Rfl: 0   Multiple Vitamin (MULTIVITAMIN WITH MINERALS) TABS tablet, Take 1 tablet by mouth daily., Disp: , Rfl:    polyvinyl alcohol (LIQUIFILM TEARS) 1.4 % ophthalmic solution, Place 1 drop into both eyes daily as needed for dry eyes., Disp: , Rfl:    Ubrogepant (UBRELVY) 100 MG TABS, Take 100 mg by mouth daily as needed (migraines)., Disp: , Rfl:    Vitamin D, Ergocalciferol, (DRISDOL) 1.25 MG (50000 UNIT) CAPS capsule, Take 1 capsule (50,000 Units total) by mouth every 14 (fourteen) days., Disp: 6 capsule, Rfl: 1  Review of Systems: + fatigue, diarrhea Denies appetite changes, fevers, chills, unexplained weight changes. Denies hearing loss, neck lumps or masses, mouth sores, ringing in ears or voice changes. Denies cough or wheezing.  Denies shortness of breath. Denies chest pain or palpitations. Denies leg swelling. Denies abdominal distention, pain, blood in stools, constipation, nausea, vomiting, or early satiety. Denies pain with intercourse, dysuria, frequency, hematuria or incontinence. Denies hot  flashes, pelvic pain, vaginal bleeding or vaginal discharge.   Denies joint pain, back pain or muscle pain/cramps. Denies itching, rash, or wounds. Denies dizziness, headaches, numbness or seizures. Denies swollen lymph nodes or glands, denies easy bruising or bleeding. Denies anxiety, depression, confusion, or decreased concentration.  Physical Exam: BP 129/75 (BP Location: Left Arm, Patient Position: Sitting)   Pulse 77   Temp 98.3 F (36.8 C) (Oral)   Resp 14   Wt 149 lb 9.6 oz (67.9 kg)   SpO2 (!) 77%   BMI 24.89 kg/m  General: Alert, oriented, no acute distress. HEENT: Normocephalic, atraumatic, sclera anicteric. Chest: Clear to auscultation bilaterally.  No wheezes or rhonchi. Cardiovascular:  Regular rate and rhythm, no murmurs. Abdomen: soft, nontender.  Normoactive bowel sounds.  No masses or hepatosplenomegaly appreciated.  Well-healed incisions. Extremities: Grossly normal range of motion.  Warm, well perfused.  No edema bilaterally. Skin: No rashes or lesions noted. Lymphatics: No cervical, supraclavicular, or inguinal adenopathy. GU: Normal appearing external genitalia without erythema, excoriation, or lesions.  Speculum exam reveals mildly atrophic vaginal mucosa, some radiation changes present.  Minimal agglutination towards the apex of the vagina.  Bimanual exam reveals cuff is smooth with, no nodularity or masses.  Rectovaginal exam confirms findings.  Laboratory & Radiologic Studies: None new  Assessment & Plan: Natalie [name] is a 72 y.o. woman with Stage 1A grade 1 endometrioid endometrial adenocarcinoma, lymph node assessment deferred.  MMR intact, MSS. Difficulty in determining invasion (estimated at <50%) and size of tumor given adenomyosis. No LVI. Given large area of myometrium involved, tumor size suspected to be larger.  Adjuvant brachytherapy was recommended which was completed in February 2023.  Patient is overall doing well and is NED on exam today.  Discussed importance of vaginal dilator use to help avoid agglutination.  Patient has not been using her dilator recently secondary to other medical issues.  Patient is overdue for colonoscopy.  Notes that with her cardiac issues and recent stroke, she cannot have a colonoscopy.  I encouraged her to talk to her primary care provider about Cologuard.   Per NCCN surveillance recommendations, we will plan on visits every 3 months alternating between our clinic and radiation oncology for 2 years and then visits every 6 months.  She sees Dr. Sondra Come in October and was asked to call to schedule an appointment to see me sometime after the new year.     We reviewed signs and symptoms that would be concerning for cancer recurrence, and I encouraged the patient to call if she develops any of these.  20 minutes of total time was spent for this patient encounter, including preparation, face-to-face counseling with the patient and coordination of care, and documentation of the encounter.  Jeral Pinch, MD  Division of Gynecologic Oncology  Department of Obstetrics and Gynecology  [facility]

## 2022-03-24 ENCOUNTER — Ambulatory Visit: Payer: Medicare Other | Admitting: Physical Therapy

## 2022-03-24 ENCOUNTER — Encounter: Payer: Self-pay | Admitting: Physical Therapy

## 2022-03-24 DIAGNOSIS — R2681 Unsteadiness on feet: Secondary | ICD-10-CM

## 2022-03-24 DIAGNOSIS — I63412 Cerebral infarction due to embolism of left middle cerebral artery: Secondary | ICD-10-CM | POA: Diagnosis not present

## 2022-03-24 DIAGNOSIS — R4701 Aphasia: Secondary | ICD-10-CM | POA: Diagnosis not present

## 2022-03-24 DIAGNOSIS — R269 Unspecified abnormalities of gait and mobility: Secondary | ICD-10-CM

## 2022-03-24 DIAGNOSIS — R2689 Other abnormalities of gait and mobility: Secondary | ICD-10-CM

## 2022-03-24 DIAGNOSIS — M6281 Muscle weakness (generalized): Secondary | ICD-10-CM

## 2022-03-24 DIAGNOSIS — R41841 Cognitive communication deficit: Secondary | ICD-10-CM | POA: Diagnosis not present

## 2022-03-24 DIAGNOSIS — Z8673 Personal history of transient ischemic attack (TIA), and cerebral infarction without residual deficits: Secondary | ICD-10-CM | POA: Diagnosis not present

## 2022-03-24 DIAGNOSIS — R262 Difficulty in walking, not elsewhere classified: Secondary | ICD-10-CM

## 2022-03-24 NOTE — Therapy (Unsigned)
OUTPATIENT PHYSICAL THERAPY NEURO EVALUATION   Patient Name: Natalie [name] MRN: [id number] DOB:Jan 06, 1951, 72 y.o., female Today's Date: 03/24/2022   PCP: [name], MD REFERRING PROVIDER: Vladimir Crofts, MD   END OF SESSION:  PT End of Session - 03/24/22 1722     Visit Number 1    Number of Visits 24    Date for PT Re-Evaluation 06/16/22    Authorization Type Traditional Medicare with [employer] Supplemental: VL based on certification    Progress Note Due on Visit 10    PT Start Time 1604    PT Stop Time 1658    PT Time Calculation (min) 54 min    Equipment Utilized During Treatment Gait belt    Activity Tolerance Patient tolerated treatment well    Behavior During Therapy WFL for tasks assessed/performed             Past Medical History:  Diagnosis Date   Acute bronchospasm due to viral infection 2020   Due to pollen   Acute ischemic left MCA stroke ([place]) 04/19/2020   a.) CTA head/neck 04/19/2020 --> nearly occlusive thrombus at bifurcation of a proximal M2 MCA branch approximately 6 mm from the MCA bifurcation. Subsequent occlusion of a proximal left M3 MCA branch   Allergy    Anemia    Angina at rest    Breast cancer, left ([place]) 08/21/2021   a.) stereotactic Bx 08/21/2021 --> IMC (G1, ER/PR +, Her2/neu -)   Cataract    a.) s/p extraction with IOL placement   Depressive disorder    Diverticulosis    Dyspnea    Epistaxis    Fatigue    Hearing loss    History of 2019 novel coronavirus disease (COVID-19) 12/18/2020   History of ischemic multifocal posterior circulation stroke 02/28/2020   a.) presented to ED with increasing episodes of  transient vertical binocular diplopia; MRI 02/27/2021 --> multiple punctate acute to subacute ischemic infarcts involving the cortical/subcortical aspects of both parieto-occipital regions   History of loop recorder    History of radiation therapy    Vagina-02/11/21-03/14/21- Dr. Gery Pray   HLD (hyperlipidemia)     Hypothyroidism    LAFB (left anterior fascicular block)    Long term current use of antithrombotics/antiplatelets    a.) on DAPT therapy (ASA + clopidogrel)   Memory change    Migraine    Osteoarthritis    Osteoporosis    Palpitations    Premature menopause    QT prolongation    Seizures ([place]) 1994   history of mini seizures, possible migraine induced   Stage I adenocarcinoma of endometrium ([place]) 12/18/2020   a.) stage 1A; grade 1 (pT1a, pN0); b.) s/p TLH/BSO 12/18/2020 + adjuvant vaginal brachytherapy (30 Gy over 5 fractions between 02/11/2021 - 03/14/2021)   Vitamin D deficiency    Past Surgical History:  Procedure Laterality Date   ABDOMINAL HYSTERECTOMY  12/18/2020   ABLATION  2006   AXILLARY SENTINEL NODE BIOPSY Left 09/25/2021   Procedure: AXILLARY SENTINEL NODE BIOPSY;  Surgeon: Robert Bellow, MD;  Location: [facility] ORS;  Service: General;  Laterality: Left;   BREAST BIOPSY Left 08/21/2021   Stereo Bx, X-clip; path --> IMC (G1, ER/PR +, Her2/neu -)   BREAST LUMPECTOMY WITH NEEDLE LOCALIZATION Left 09/25/2021   Procedure: BREAST LUMPECTOMY WITH NEEDLE LOCALIZATION;  Surgeon: Robert Bellow, MD;  Location: [facility];  Service: General;  Laterality: Left;   BUBBLE STUDY  04/23/2020   Procedure: BUBBLE STUDY;  Surgeon:  Larey Dresser, MD;  Location: [facility] ENDOSCOPY;  Service: Cardiovascular;;   CATARACT EXTRACTION W/ INTRAOCULAR LENS IMPLANT Bilateral    COLONOSCOPY     COLONOSCOPY WITH PROPOFOL N/A 12/22/2018   Procedure: COLONOSCOPY WITH PROPOFOL;  Surgeon: Virgel Manifold, MD;  Location: [facility] ENDOSCOPY;  Service: Endoscopy;  Laterality: N/A;   EYE SURGERY  Spring 2018   Cataracts both eyes   HYSTEROSCOPY WITH D & C N/A 11/01/2020   Procedure: DILATATION AND CURETTAGE /HYSTEROSCOPY WITH MYOSURE;  Surgeon: Lafonda Mosses, MD;  Location: [facility];  Service: Gynecology;  Laterality: N/A;   LAPAROTOMY  12/18/2020   Procedure: LAPAROTOMY;  Surgeon: Lafonda Mosses,  MD;  Location: [facility];  Service: Gynecology;;   LOOP RECORDER INSERTION N/A 04/23/2020   Procedure: LOOP RECORDER INSERTION;  Surgeon: Evans Lance, MD;  Location: [facility];  Service: Cardiovascular;  Laterality: N/A;   ROBOTIC ASSISTED TOTAL HYSTERECTOMY WITH BILATERAL SALPINGO OOPHERECTOMY Bilateral 12/18/2020   Procedure: XI ROBOTIC ASSISTED TOTAL HYSTERECTOMY WITH BILATERAL SALPINGO OOPHORECTOMY;  Surgeon: Lafonda Mosses, MD;  Location: [facility];  Service: Gynecology;  Laterality: Bilateral;   SENTINEL NODE BIOPSY N/A 12/18/2020   Procedure: SENTINEL NODE BIOPSY;  Surgeon: Lafonda Mosses, MD;  Location: [facility];  Service: Gynecology;  Laterality: N/A;   TEE WITHOUT CARDIOVERSION N/A 04/23/2020   Procedure: TRANSESOPHAGEAL ECHOCARDIOGRAM (TEE);  Surgeon: Larey Dresser, MD;  Location: [facility] ENDOSCOPY;  Service: Cardiovascular;  Laterality: N/A;   TONSILLECTOMY     Patient Active Problem List   Diagnosis Date Noted   Cerebrovascular accident (CVA) ([place]) 12/05/2021   Acute CVA (cerebrovascular accident) ([place]) 12/04/2021   Trigger finger, right ring finger 09/10/2021   Dupuytren's contracture of both hands 09/10/2021   Vitamin D deficiency 09/10/2021   Malignant neoplasm of left breast in female, estrogen receptor positive ([place]) 09/01/2021   Dyslipidemia 05/16/2021   Major depression, recurrent, chronic ([place]) 03/13/2021   Mild protein-calorie malnutrition ([place]) 03/13/2021   Endometrial cancer ([place]) 12/26/2020   Postoperative anemia due to acute blood loss 12/19/2020   History of CVA in adulthood 04/19/2020   History of ischemic multifocal posterior circulation stroke 02/29/2020   Difficulty hearing 06/28/2015   Arthritis, degenerative 06/28/2015   Paresthesia 06/28/2015   Purpura, nonthrombopenic ([place]) 06/28/2015   Adult hypothyroidism 08/09/2014   Migraine without aura and without status migrainosus, not intractable 09/19/2008   Moderate major depression ([place])  09/07/2006   OP (osteoporosis) 09/07/2006    ONSET DATE: 04/19/20  REFERRING DIAG: [id number] (ICD-10-CM) - Personal history of transient ischemic attack (TIA), and cerebral infarction without residual deficits   THERAPY DIAG:  Muscle weakness (generalized)  Unsteadiness on feet  Other abnormalities of gait and mobility  Abnormality of gait and mobility  Difficulty in walking, not elsewhere classified  Rationale for Evaluation and Treatment: Rehabilitation  SUBJECTIVE:  SUBJECTIVE STATEMENT:  Pt reports she has previously been into PT before but was unable to complete due to complications with uterine cancer.  Pt was barely walking the last time she was here. Pt still has problems with walking in a straight line feels her gait is off. Pt gets dizzy at times and has to lean against a wall. Pt has this dizziness 1-2 times per week and it is short in duration. If pt is up and walking around the house and doing a lot this is when this happens. Pt reports she walks about 2 miles per week. Pt has 2 dogs that are " cute but a handful" and one dog walks between her legs which creates potential for a falls..  Pt reports cancer is in remission, she had a histerectomy and lumpectomy and is still being closely watched by both doctors.  Patient has a part in her house where she walks.  Patient feels when she is walking she has difficulty maintaining straight line and feels like she has to have a constant concentration on this in order to walk safely. Pt accompanied by: self  PERTINENT HISTORY: Pt is a 72 year old female with history of multiple strokes, migraine, "Subjective versus Mild Cognitive Impairment (word finding difficulty, short-term recall), in patient with positive family history of Alzheimer's Disease,  elevated p-tau, unremarkable SNFL," moderate non-positional obstructive sleep apnea, subjective upper extremity weakness, slight ataxia, imbalance, decreased sensation to bilateral lower extremity, hyperreflexia as well as chemo radiation for endometrial cancer and recent radiation to her left breast.   PAIN:  Are you having pain? No  PRECAUTIONS: None  WEIGHT BEARING RESTRICTIONS: No  FALLS: Has patient fallen in last 6 months? Yes. Number of falls 1 fall   LIVING ENVIRONMENT:  Lives with:  Lives with DTR who drops in and drops in once in a week Lives in: House/apartment Stairs: Yes: Internal: 15 steps; on left going up and External: 5 steps; can reach both Has following equipment at home: Single point cane  PLOF: Independent  PATIENT GOALS: walking straight ( without veering), feel more comfortable walking out and about in the park, improve walking speed.   OBJECTIVE:   DIAGNOSTIC FINDINGS:   IMPRESSION: 1. No acute intracranial abnormality. 2. Mild chronic microvascular ischemic changes of the white matter. 3. Remote infarcts in the left parietal and occipital lobes and left thalamus.  COGNITION: Overall cognitive status: Within functional limits for tasks assessed   SENSATION: WFL  COORDINATION: Not tested  LOWER EXTREMITY ROM:   WNL, no gross abnormalities    LOWER EXTREMITY MMT:  WNL   BED MOBILITY:  No complaints or issues    TRANSFERS: Assistive device utilized: None  Sit to stand: Complete Independence Stand to sit: Complete Independence Chair to chair: Complete Independence Floor: No assessed    CURB:  Level of Assistance: Complete Independence  STAIRS: Not tested, reports she is comfortable as long as she uses rail    GAIT: Gait pattern:  Step through gait pattern with fair gait speed Distance walked: 10 m Assistive device utilized: None Level of assistance: Complete Independence Comments: Patient does have signs of imbalance and unequal  step length with narrow base of support at times patient reports her gait is improved this date but sometimes with fatigue it does change.  This will be assessed with 6-minute walk test and visit two  FUNCTIONAL TESTS:  5 times sit to stand: 15.1 sec  Timed up and go (TUG): 14.6 sec 6  minute walk test: Assessed visit two 10 meter walk test: .89 m/s Mini Best:   [facility] PT Assessment - 03/24/22 0001       Standardized Balance Assessment   Standardized Balance Assessment Mini-BESTest      Mini-BESTest   Sit To Stand Normal: Comes to stand without use of hands and stabilizes independently.    Rise to Toes Moderate: Heels up, but not full range (smaller than when holding hands), OR noticeable instability for 3 s.    Stand on one leg (left) Moderate: < 20 s    Stand on one leg (right) Moderate: < 20 s    Stand on one leg - lowest score 1    Compensatory Stepping Correction - Forward Normal: Recovers independently with a single, large step (second realignement is allowed).    Compensatory Stepping Correction - Backward Moderate: More than one step is required to recover equilibrium    Compensatory Stepping Correction - Left Lateral Moderate: Several steps to recover equilibrium    Compensatory Stepping Correction - Right Lateral Normal: Recovers independently with 1 step (crossover or lateral OK)    Stepping Corredtion Lateral - lowest score 1    Stance - Feet together, eyes open, firm surface  Normal: 30s    Stance - Feet together, eyes closed, foam surface  Moderate: < 30s    Incline - Eyes Closed Moderate: Stands independently < 30s OR aligns with surface    Change in Gait Speed Normal: Significantly changes walkling speed without imbalance    Walk with head turns - Horizontal Moderate: performs head turns with reduction in gait speed.    Walk with pivot turns Normal: Turns with feet close FAST (< 3 steps) with good balance.    Step over obstacles Moderate: Steps over box but touches box OR  displays cautious behavior by slowing gait.    Timed UP & GO with Dual Task Moderate: Dual Task affects either counting OR walking (>10%) when compared to the TUG without Dual Task.    Mini-BEST total score 19              PATIENT SURVEYS:  FOTO 50 Goal of 52  TODAY'S TREATMENT:                                                                                                                              DATE:03/24/22   Treatment Only     PATIENT EDUCATION: Education details: POC Person educated: Patient Education method: Explanation Education comprehension: verbalized understanding  HOME EXERCISE PROGRAM: To begin visit 2   GOALS: Goals reviewed with patient? Yes  SHORT TERM GOALS: Target date: 04/22/2022       Patient will be independent in home exercise program to improve strength/mobility for better functional independence with ADLs. Baseline: No HEP currently  Goal status: INITIAL   LONG TERM GOALS: Target date: 06/17/2022      1.  Patient will increase FOTO score to equal  to or greater than  55   to demonstrate statistically significant improvement in mobility and quality of life.  Baseline: 52 Goal status: INITIAL   2.  Patient will increase Mini Best Balance score by > 4 points to demonstrate decreased fall risk during functional activities. Baseline: 19 Goal status: INITIAL   3.   Patient will reduce timed up and go to <11 seconds to reduce fall risk and demonstrate improved transfer/gait ability. Baseline: 14.6 sec Goal status: INITIAL  4.   Patient will increase 10 meter walk test to >1.59ms as to improve gait speed for better community ambulation and to reduce fall risk. Baseline: .89 m/s Goal status: INITIAL  5.   Patient will increase six minute walk test distance to >1200 feet and with no signs of imbalance for progression to community ambulator and improve gait ability Baseline: Test visit 2 Goal status:  INITIAL     ASSESSMENT:  CLINICAL IMPRESSION: Patient is a 72y.o. Female who was seen today for physical therapy evaluation and treatment for balance and mobility concerns following chemotherapy for uterine cancer as well as radiation for breast cancer.  Patient feels her endurance is not up to par and her balance with walking causes concern.  Patient does demonstrate impairments in balance as evidenced by mini best balance test showing impairments in anticipatory postural control as well as reactive postural control and dual task.  Patient will benefit from skilled physical therapy interventions to improve her balance, mobility, and endurance.  OBJECTIVE IMPAIRMENTS: Abnormal gait, decreased activity tolerance, decreased balance, decreased endurance, decreased mobility, difficulty walking, decreased ROM, decreased strength, and impaired perceived functional ability.   ACTIVITY LIMITATIONS: standing, squatting, stairs, and locomotion level  PARTICIPATION LIMITATIONS: cleaning, shopping, community activity, and yard work  PERSONAL FACTORS: Time since onset of injury/illness/exacerbation and 3+ comorbidities: ischemic stroke, cancer in remission, arthritis,   are also affecting patient's functional outcome.   REHAB POTENTIAL: Good  CLINICAL DECISION MAKING: Stable/uncomplicated  EVALUATION COMPLEXITY: Low  PLAN:  PT FREQUENCY: 1-2x/week  PT DURATION: 12 weeks  PLANNED INTERVENTIONS: Therapeutic exercises, Therapeutic activity, Neuromuscular re-education, Balance training, Gait training, Patient/Family education, Self Care, Joint mobilization, and Stair training  PLAN FOR NEXT SESSION: HEP, high level balance activities, ambulation with dual task and with head turns.    CParticia Lather PT 03/24/2022, 5:22 PM

## 2022-03-25 ENCOUNTER — Encounter: Payer: Self-pay | Admitting: Physical Therapy

## 2022-03-27 ENCOUNTER — Ambulatory Visit: Payer: Medicare Other

## 2022-03-27 ENCOUNTER — Ambulatory Visit: Payer: Medicare Other | Admitting: Speech Pathology

## 2022-03-27 DIAGNOSIS — R41841 Cognitive communication deficit: Secondary | ICD-10-CM | POA: Diagnosis not present

## 2022-03-27 DIAGNOSIS — Z8673 Personal history of transient ischemic attack (TIA), and cerebral infarction without residual deficits: Secondary | ICD-10-CM | POA: Diagnosis not present

## 2022-03-27 DIAGNOSIS — M6281 Muscle weakness (generalized): Secondary | ICD-10-CM

## 2022-03-27 DIAGNOSIS — R2681 Unsteadiness on feet: Secondary | ICD-10-CM

## 2022-03-27 DIAGNOSIS — R2689 Other abnormalities of gait and mobility: Secondary | ICD-10-CM

## 2022-03-27 DIAGNOSIS — I63412 Cerebral infarction due to embolism of left middle cerebral artery: Secondary | ICD-10-CM | POA: Diagnosis not present

## 2022-03-27 DIAGNOSIS — R269 Unspecified abnormalities of gait and mobility: Secondary | ICD-10-CM

## 2022-03-27 DIAGNOSIS — R4701 Aphasia: Secondary | ICD-10-CM | POA: Diagnosis not present

## 2022-03-27 DIAGNOSIS — R262 Difficulty in walking, not elsewhere classified: Secondary | ICD-10-CM

## 2022-03-27 NOTE — Therapy (Signed)
OUTPATIENT PHYSICAL THERAPY NEURO TREATMENT   Patient Name: Natalie [name] MRN: [id number] DOB:Dec 14, 1950, 72 y.o., female Today's Date: 03/27/2022   PCP: [name], MD REFERRING PROVIDER: Vladimir Crofts, MD   END OF SESSION:  PT End of Session - 03/27/22 1632     Visit Number 2    Number of Visits 24    Date for PT Re-Evaluation 06/16/22    Authorization Type Traditional Medicare with [employer] Supplemental: VL based on certification    Authorization Time Period 03/24/22-06/16/22    Progress Note Due on Visit 10    PT Start Time 1600    PT Stop Time 1645    PT Time Calculation (min) 45 min    Activity Tolerance Patient tolerated treatment well;No increased pain    Behavior During Therapy WFL for tasks assessed/performed             Past Medical History:  Diagnosis Date   Acute bronchospasm due to viral infection 2020   Due to pollen   Acute ischemic left MCA stroke ([place]) 04/19/2020   a.) CTA head/neck 04/19/2020 --> nearly occlusive thrombus at bifurcation of a proximal M2 MCA branch approximately 6 mm from the MCA bifurcation. Subsequent occlusion of a proximal left M3 MCA branch   Allergy    Anemia    Angina at rest    Breast cancer, left ([place]) 08/21/2021   a.) stereotactic Bx 08/21/2021 --> IMC (G1, ER/PR +, Her2/neu -)   Cataract    a.) s/p extraction with IOL placement   Depressive disorder    Diverticulosis    Dyspnea    Epistaxis    Fatigue    Hearing loss    History of 2019 novel coronavirus disease (COVID-19) 12/18/2020   History of ischemic multifocal posterior circulation stroke 02/28/2020   a.) presented to ED with increasing episodes of  transient vertical binocular diplopia; MRI 02/27/2021 --> multiple punctate acute to subacute ischemic infarcts involving the cortical/subcortical aspects of both parieto-occipital regions   History of loop recorder    History of radiation therapy    Vagina-02/11/21-03/14/21- Dr. Gery Pray   HLD  (hyperlipidemia)    Hypothyroidism    LAFB (left anterior fascicular block)    Long term current use of antithrombotics/antiplatelets    a.) on DAPT therapy (ASA + clopidogrel)   Memory change    Migraine    Osteoarthritis    Osteoporosis    Palpitations    Premature menopause    QT prolongation    Seizures ([place]) 1994   history of mini seizures, possible migraine induced   Stage I adenocarcinoma of endometrium ([place]) 12/18/2020   a.) stage 1A; grade 1 (pT1a, pN0); b.) s/p TLH/BSO 12/18/2020 + adjuvant vaginal brachytherapy (30 Gy over 5 fractions between 02/11/2021 - 03/14/2021)   Vitamin D deficiency    Past Surgical History:  Procedure Laterality Date   ABDOMINAL HYSTERECTOMY  12/18/2020   ABLATION  2006   AXILLARY SENTINEL NODE BIOPSY Left 09/25/2021   Procedure: AXILLARY SENTINEL NODE BIOPSY;  Surgeon: Robert Bellow, MD;  Location: [facility] ORS;  Service: General;  Laterality: Left;   BREAST BIOPSY Left 08/21/2021   Stereo Bx, X-clip; path --> IMC (G1, ER/PR +, Her2/neu -)   BREAST LUMPECTOMY WITH NEEDLE LOCALIZATION Left 09/25/2021   Procedure: BREAST LUMPECTOMY WITH NEEDLE LOCALIZATION;  Surgeon: Robert Bellow, MD;  Location: [facility] ORS;  Service: General;  Laterality: Left;   BUBBLE STUDY  04/23/2020   Procedure: BUBBLE STUDY;  Surgeon:  Larey Dresser, MD;  Location[facility] ENDOSCOPY;  Service: Cardiovascular;;   CATARACT EXTRACTION W/ INTRAOCULAR LENS IMPLANT Bilateral    COLONOSCOPY     COLONOSCOPY WITH PROPOFOL N/A 12/22/2018   Procedure: COLONOSCOPY WITH PROPOFOL;  Surgeon: Virgel Manifold, MD;  Location: [facility] ENDOSCOPY;  Service: Endoscopy;  Laterality: N/A;   EYE SURGERY  Spring 2018   Cataracts both eyes   HYSTEROSCOPY WITH D & C N/A 11/01/2020   Procedure: DILATATION AND CURETTAGE /HYSTEROSCOPY WITH MYOSURE;  Surgeon: Lafonda Mosses, MD;  Location: [facility];  Service: Gynecology;  Laterality: N/A;   LAPAROTOMY  12/18/2020   Procedure: LAPAROTOMY;  Surgeon:  Lafonda Mosses, MD;  Location: [facility];  Service: Gynecology;;   LOOP RECORDER INSERTION N/A 04/23/2020   Procedure: LOOP RECORDER INSERTION;  Surgeon: Evans Lance, MD;  Location: [place] [facility];  Service: Cardiovascular;  Laterality: N/A;   ROBOTIC ASSISTED TOTAL HYSTERECTOMY WITH BILATERAL SALPINGO OOPHERECTOMY Bilateral 12/18/2020   Procedure: XI ROBOTIC ASSISTED TOTAL HYSTERECTOMY WITH BILATERAL SALPINGO OOPHORECTOMY;  Surgeon: Lafonda Mosses, MD;  Location: [facility];  Service: Gynecology;  Laterality: Bilateral;   SENTINEL NODE BIOPSY N/A 12/18/2020   Procedure: SENTINEL NODE BIOPSY;  Surgeon: Lafonda Mosses, MD;  Location: [facility];  Service: Gynecology;  Laterality: N/A;   TEE WITHOUT CARDIOVERSION N/A 04/23/2020   Procedure: TRANSESOPHAGEAL ECHOCARDIOGRAM (TEE);  Surgeon: Larey Dresser, MD;  Location: [facility] ENDOSCOPY;  Service: Cardiovascular;  Laterality: N/A;   TONSILLECTOMY     Patient Active Problem List   Diagnosis Date Noted   Cerebrovascular accident (CVA) ([place]) 12/05/2021   Acute CVA (cerebrovascular accident) ([place]) 12/04/2021   Trigger finger, right ring finger 09/10/2021   Dupuytren's contracture of both hands 09/10/2021   Vitamin D deficiency 09/10/2021   Malignant neoplasm of left breast in female, estrogen receptor positive ([place]) 09/01/2021   Dyslipidemia 05/16/2021   Major depression, recurrent, chronic ([place]) 03/13/2021   Mild protein-calorie malnutrition ([place]) 03/13/2021   Endometrial cancer ([place]) 12/26/2020   Postoperative anemia due to acute blood loss 12/19/2020   History of CVA in adulthood 04/19/2020   History of ischemic multifocal posterior circulation stroke 02/29/2020   Difficulty hearing 06/28/2015   Arthritis, degenerative 06/28/2015   Paresthesia 06/28/2015   Purpura, nonthrombopenic ([place]) 06/28/2015   Adult hypothyroidism 08/09/2014   Migraine without aura and without status migrainosus, not intractable 09/19/2008   Moderate major  depression ([place]) 09/07/2006   OP (osteoporosis) 09/07/2006    ONSET DATE: 04/19/20  REFERRING DIAG: [id number] (ICD-10-CM) - Personal history of transient ischemic attack (TIA), and cerebral infarction without residual deficits   THERAPY DIAG:  Muscle weakness (generalized)  Unsteadiness on feet  Other abnormalities of gait and mobility  Abnormality of gait and mobility  Difficulty in walking, not elsewhere classified  Rationale for Evaluation and Treatment: Rehabilitation  SUBJECTIVE:  SUBJECTIVE STATEMENT: Pt doing well today, has not had any falls since eval.    PERTINENT HISTORY: Pt is a 72 year old female with history of multiple strokes, migraine, "Subjective versus Mild Cognitive Impairment (word finding difficulty, short-term recall), in patient with positive family history of Alzheimer's Disease, elevated p-tau, unremarkable SNFL," moderate non-positional obstructive sleep apnea, subjective upper extremity weakness, slight ataxia, imbalance, decreased sensation to bilateral lower extremity, hyperreflexia as well as chemo radiation for endometrial cancer and recent radiation to her left breast.   PAIN:  Are you having pain? No  PRECAUTIONS: None  WEIGHT BEARING RESTRICTIONS: No  FALLS: Has patient fallen in last 6 months? Yes. Number of falls 1 fall   LIVING ENVIRONMENT:  Lives with:  Lives with DTR who drops in and drops in once in a week Lives in: House/apartment Stairs: Yes: Internal: 15 steps; on left going up and External: 5 steps; can reach both Has following equipment at home: Single point cane  PLOF: Independent  PATIENT GOALS: walking straight ( without veering), feel more comfortable walking out and about in the park, improve walking speed.   OBJECTIVE:   TODAY'S  TREATMENT:                                                                                                                              DATE:03/27/22  Orthostatic vital signs: no symptoms during testing (pt historically not symptomatic), no dizziness issues today Supine: 128/71 mmHg 62bpm  Seated: 125/72 mmHg 61bpm Standing: 120/79 mmHg 64bpm (0 minutes)  Standing: 124/82 85mHg (1 minutes)  Standing: 120/134mg 67bpm   6MWT: no device, [id number] sway, drifting, crossover; feels to be essentially at baseline.   Post AMB vtials, standing (6MWT)  100% SpO2, 126/105m32m 72bpm  HEP reincarnation: Access Code: [email or web address]  URL: [email or web address]  Date: 03/27/2022  Prepared by: JoshRaleigh Nationxercises -  Sit to Stand - 1 x daily - 4 x weekly - 3 sets - 10 reps -  Standing Hip Abduction with Counter Support - 1 x daily - 4 x weekly - 3 sets - 15 reps -  Seated Heel Raise - 1 x daily - 4 x weekly - 3 sets - 15 reps -  Heel rises with counter support - 1 x daily - 4 x weekly - 3 sets - 15 reps -  Standing March with Counter Support - 1 x daily - 4 x weekly - 3 sets - 15 reps   -Performed 1 set of each in session  -SLS balance training 15x 5 sec bilat, alternating sides, BUE righting on support bar when step righting insufficient *progressive decline in performance, I suspect related to fatigue, still has Left hemi weakness; pt takes a break due to being tired.    PATIENT EDUCATION: Allow for adequate rest breaks when motor fatigue intereferes with quality work of balance training.   HOME EXERCISE PROGRAM: Access Code: [email or web address]  URL: [email or web address]  Date: 03/27/2022  Prepared by: [place] Nation   Exercises -  Sit to Stand - 1 x daily - 4 x weekly - 3 sets - 10 reps -  Standing Hip Abduction with Counter Support - 1 x daily - 4 x weekly - 3 sets - 15 reps -  Seated Heel Raise - 1 x daily - 4 x weekly - 3 sets - 15 reps -  Heel rises  with counter support - 1 x daily - 4 x weekly - 3 sets - 15 reps -  Standing March with Counter Support - 1 x daily - 4 x weekly - 3 sets - 15 reps   GOALS: Goals reviewed with patient? Yes  SHORT TERM GOALS: Target date: 04/22/2022       Patient will be independent in home exercise program to improve strength/mobility for better functional independence with ADLs. Baseline: No HEP currently  Goal status: INITIAL   LONG TERM GOALS: Target date: 06/17/2022      1.  Patient will increase FOTO score to equal to or greater than  55   to demonstrate statistically significant improvement in mobility and quality of life.  Baseline: 52 Goal status: INITIAL   2.  Patient will increase Mini Best Balance score by > 4 points to demonstrate decreased fall risk during functional activities. Baseline: 19 Goal status: INITIAL   3.   Patient will reduce timed up and go to <11 seconds to reduce fall risk and demonstrate improved transfer/gait ability. Baseline: 14.6 sec Goal status: INITIAL  4.   Patient will increase 10 meter walk test to >1.30ms as to improve gait speed for better community ambulation and to reduce fall risk. Baseline: .89 m/s Goal status: INITIAL  5.   Patient will increase six minute walk test distance to >1630 feet and with no signs of imbalance for progression to super community ambulator. Baseline: 03/27/22: 14671f no device, intermitent drifting/sway/crossover Goal status: REVISED     ASSESSMENT:  CLINICAL IMPRESSION: Finished tests and measures remaining from evaluation visit. 6MWT coming in at [id number] close to presumed baseline, however pt has struggled with sustained mobility for >2 years, hence this may be an up-trending performance comparably. AMB although well tolerated is not without sway, occasional crossover, and/or lateral drifting. Pt has frequent LOB during SLS practice, but her step righting strategy is suboptimal, where pt remains reliant on UE  use to recover LOB. Pt is educated on her HEP for basic strengthening, however will need specific balance interventions added in once deemed appropriate. Ortho vitals not symptomatic nor demonstrative of hypotension, however flat response to activity warrants continued monitoring given >12 month history of orthostatic intolerance as documented here in this clinic.   OBJECTIVE IMPAIRMENTS: Abnormal gait, decreased activity tolerance, decreased balance, decreased endurance, decreased mobility, difficulty walking, decreased ROM, decreased strength, and impaired perceived functional ability.   ACTIVITY LIMITATIONS: standing, squatting, stairs, and locomotion level  PARTICIPATION LIMITATIONS: cleaning, shopping, community activity, and yard work  PERSONAL FACTORS: Time since onset of injury/illness/exacerbation and 3+ comorbidities: ischemic stroke, cancer in remission, arthritis,   are also affecting patient's functional outcome.   REHAB POTENTIAL: Good  CLINICAL DECISION MAKING: Stable/uncomplicated  EVALUATION COMPLEXITY: Low  PLAN:  PT FREQUENCY: 1-2x/week  PT DURATION: 12 weeks  PLANNED INTERVENTIONS: Therapeutic exercises, Therapeutic activity, Neuromuscular re-education, Balance training, Gait training, Patient/Family education, Self Care, Joint mobilization, and Stair training  PLAN FOR NEXT SESSION: Continue to monitor for orthostatic intolerance (pressures if not tolerating prolonged standing),  advance HEP with balance exercises, gait based balance;   Anndrea Mihelich C, PT 03/27/2022, 5:04 PM  5:05 PM, 03/27/22 Etta Grandchild, PT, DPT Physical Therapist - [place] [phone]

## 2022-03-27 NOTE — Therapy (Addendum)
OUTPATIENT SPEECH LANGUAGE PATHOLOGY TREATMENT NOTE   Patient Name: Natalie [name] MRN: [id number] DOB:1950/04/10, 72 y.o., female Today's Date: 03/27/2022  PCP: [name], MD REFERRING PROVIDER: Jennings Books, MD  END OF SESSION:   End of Session - 03/27/22 1603     Visit Number 4    Number of Visits 25    Date for SLP Re-Evaluation 06/01/22    Authorization Type Medicare A/Medicare B    Progress Note Due on Visit 10    SLP Start Time 1500    SLP Stop Time  1600    SLP Time Calculation (min) 60 min    Activity Tolerance Patient tolerated treatment well             Past Medical History:  Diagnosis Date   Acute bronchospasm due to viral infection 2020   Due to pollen   Acute ischemic left MCA stroke ([place]) 04/19/2020   a.) CTA head/neck 04/19/2020 --> nearly occlusive thrombus at bifurcation of a proximal M2 MCA branch approximately 6 mm from the MCA bifurcation. Subsequent occlusion of a proximal left M3 MCA branch   Allergy    Anemia    Angina at rest    Breast cancer, left ([place]) 08/21/2021   a.) stereotactic Bx 08/21/2021 --> IMC (G1, ER/PR +, Her2/neu -)   Cataract    a.) s/p extraction with IOL placement   Depressive disorder    Diverticulosis    Dyspnea    Epistaxis    Fatigue    Hearing loss    History of 2019 novel coronavirus disease (COVID-19) 12/18/2020   History of ischemic multifocal posterior circulation stroke 02/28/2020   a.) presented to ED with increasing episodes of  transient vertical binocular diplopia; MRI 02/27/2021 --> multiple punctate acute to subacute ischemic infarcts involving the cortical/subcortical aspects of both parieto-occipital regions   History of loop recorder    History of radiation therapy    Vagina-02/11/21-03/14/21- Dr. Gery Pray   HLD (hyperlipidemia)    Hypothyroidism    LAFB (left anterior fascicular block)    Long term current use of antithrombotics/antiplatelets    a.) on DAPT therapy (ASA + clopidogrel)    Memory change    Migraine    Osteoarthritis    Osteoporosis    Palpitations    Premature menopause    QT prolongation    Seizures ([place]) 1994   history of mini seizures, possible migraine induced   Stage I adenocarcinoma of endometrium ([place]) 12/18/2020   a.) stage 1A; grade 1 (pT1a, pN0); b.) s/p TLH/BSO 12/18/2020 + adjuvant vaginal brachytherapy (30 Gy over 5 fractions between 02/11/2021 - 03/14/2021)   Vitamin D deficiency    Past Surgical History:  Procedure Laterality Date   ABDOMINAL HYSTERECTOMY  12/18/2020   ABLATION  2006   AXILLARY SENTINEL NODE BIOPSY Left 09/25/2021   Procedure: AXILLARY SENTINEL NODE BIOPSY;  Surgeon: Robert Bellow, MD;  Location: [facility] ORS;  Service: General;  Laterality: Left;   BREAST BIOPSY Left 08/21/2021   Stereo Bx, X-clip; path --> IMC (G1, ER/PR +, Her2/neu -)   BREAST LUMPECTOMY WITH NEEDLE LOCALIZATION Left 09/25/2021   Procedure: BREAST LUMPECTOMY WITH NEEDLE LOCALIZATION;  Surgeon: Robert Bellow, MD;  Location: [facility];  Service: General;  Laterality: Left;   BUBBLE STUDY  04/23/2020   Procedure: BUBBLE STUDY;  Surgeon: Larey Dresser, MD;  Location: [facility] ENDOSCOPY;  Service: Cardiovascular;;   CATARACT EXTRACTION W/ INTRAOCULAR LENS IMPLANT Bilateral    COLONOSCOPY  COLONOSCOPY WITH PROPOFOL N/A 12/22/2018   Procedure: COLONOSCOPY WITH PROPOFOL;  Surgeon: Virgel Manifold, MD;  Location: [facility] ENDOSCOPY;  Service: Endoscopy;  Laterality: N/A;   EYE SURGERY  Spring 2018   Cataracts both eyes   HYSTEROSCOPY WITH D & C N/A 11/01/2020   Procedure: DILATATION AND CURETTAGE /HYSTEROSCOPY WITH MYOSURE;  Surgeon: Lafonda Mosses, MD;  Location: [facility];  Service: Gynecology;  Laterality: N/A;   LAPAROTOMY  12/18/2020   Procedure: LAPAROTOMY;  Surgeon: Lafonda Mosses, MD;  Location: [facility];  Service: Gynecology;;   LOOP RECORDER INSERTION N/A 04/23/2020   Procedure: LOOP RECORDER INSERTION;  Surgeon: Evans Lance, MD;   Location: [facility];  Service: Cardiovascular;  Laterality: N/A;   ROBOTIC ASSISTED TOTAL HYSTERECTOMY WITH BILATERAL SALPINGO OOPHERECTOMY Bilateral 12/18/2020   Procedure: XI ROBOTIC ASSISTED TOTAL HYSTERECTOMY WITH BILATERAL SALPINGO OOPHORECTOMY;  Surgeon: Lafonda Mosses, MD;  Location: [facility];  Service: Gynecology;  Laterality: Bilateral;   SENTINEL NODE BIOPSY N/A 12/18/2020   Procedure: SENTINEL NODE BIOPSY;  Surgeon: Lafonda Mosses, MD;  Location: [facility];  Service: Gynecology;  Laterality: N/A;   TEE WITHOUT CARDIOVERSION N/A 04/23/2020   Procedure: TRANSESOPHAGEAL ECHOCARDIOGRAM (TEE);  Surgeon: Larey Dresser, MD;  Location: [facility] ENDOSCOPY;  Service: Cardiovascular;  Laterality: N/A;   TONSILLECTOMY     Patient Active Problem List   Diagnosis Date Noted   Cerebrovascular accident (CVA) ([place]) 12/05/2021   Acute CVA (cerebrovascular accident) ([place]) 12/04/2021   Trigger finger, right ring finger 09/10/2021   Dupuytren's contracture of both hands 09/10/2021   Vitamin D deficiency 09/10/2021   Malignant neoplasm of left breast in female, estrogen receptor positive ([place]) 09/01/2021   Dyslipidemia 05/16/2021   Major depression, recurrent, chronic ([place]) 03/13/2021   Mild protein-calorie malnutrition ([place]) 03/13/2021   Endometrial cancer ([place]) 12/26/2020   Postoperative anemia due to acute blood loss 12/19/2020   History of CVA in adulthood 04/19/2020   History of ischemic multifocal posterior circulation stroke 02/29/2020   Difficulty hearing 06/28/2015   Arthritis, degenerative 06/28/2015   Paresthesia 06/28/2015   Purpura, nonthrombopenic ([place]) 06/28/2015   Adult hypothyroidism 08/09/2014   Migraine without aura and without status migrainosus, not intractable 09/19/2008   Moderate major depression ([place]) 09/07/2006   OP (osteoporosis) 09/07/2006    ONSET DATE: date of referral 03/11/2022    REFERRING DIAG: Z86.73 (ICD-10-CM) - Personal history of transient  ischemic attack (TIA), and cerebral infarction without residual deficits     PERTINENT HISTORY:  Pt is a 72 year old female with history of multiple strokes, migraine, "Subjective versus Mild Cognitive Impairment (word finding difficulty, short-term recall), in patient with positive family history of Alzheimer's Disease, elevated p-tau, unremarkable SNFL," moderate non-positional obstructive sleep apnea, subjective upper extremity weakness, slight ataxia, imbalance, decreased sensatio to bilateral lower extremity, hyperreflexia as well as chemo radiation for endometrial cancer and recent radiation to her left breast. .    DIAGNOSTIC FINDINGS:  12/05/2021 MRI   No acute infarction, hemorrhage, hydrocephalus, extra-axial collection or mass lesion. Small amount of scattered foci of T2 hyperintensity are seen within the white matter of the cerebral hemispheres and within the pons, nonspecific, most likely related to chronic small vessel ischemia. Remote cortical infarcts the left parietal and occipital lobes. Tiny remote infarct in the left thalamus.   07/17/2020 MRI There is no evidence of an acute infarct, mass, midline shift, or extra-axial fluid collection. Scattered small T2 hyperintensities in the cerebral white matter bilaterally are  nonspecific but compatible with mild chronic small vessel ischemic disease. There is mild encephalomalacia corresponding to the small posterior left MCA territory infarcts which were acute on the prior MRI. Chronic microhemorrhages are again noted posteriorly in both cerebral hemispheres. A chronic lacunar infarct in the left thalamus is unchanged from the prior MRI.   04/19/2020 MRI Generalized age appropriate cerebral volume. Mild chronic microvascular ischemic disease again noted.   Patchy small volume foci of restricted diffusion seen involving the cortical and subcortical aspect of the posterior left frontoparietal and temporal region,  consistent with posterior left MCA distribution infarcts. For reference purposes, the largest of these foci seen at the posterior left temporal region and measures 8 mm. Findings are embolic in distribution. No associated hemorrhage or mass effect.   Additional apparent subcentimeter focus of mild diffusion abnormality at the posterior right frontoparietal centrum semi ovale favored to reflect T2 shine through (series 2, image 34). No other evidence for acute or subacute ischemia. Gray-white matter differentiation otherwise maintained. No acute intracranial hemorrhage. Small focus of susceptibility artifact at the posterior right temporal region noted, consistent with a small chronic microhemorrhage. Additional minimal residual chronic blood products noted about the occipital regions related to recently identified ischemic infarcts.   02/28/2020 MRI Volume cerebral volume within normal limits for age. Minimal scattered patchy T2/FLAIR hyperintensity within the periventricular white matter and pons, most like related chronic microvascular ischemic disease, mild for age.   There are a few scattered punctate foci of diffusion abnormality seen involving the cortical/subcortical aspects of both parieto-occipital regions (series 5, images 29, 24, 20, 18), consistent with tiny acute to subacute ischemic infarcts. Associated minimal petechial hemorrhage (series 13, image 25). Subtle associated postcontrast enhancement consistent with subacute ischemia (series 32, images 113, 81). No malignant hemorrhagic transformation or significant regional mass effect. No evidence for subarachnoid hemorrhage within this region on prior head CT.   02/28/2020  Few scattered punctate acute to subacute ischemic infarcts involving the cortical/subcortical aspects of both parieto-occipital Regions consistent with tiny acute to subacute ischemic infarcts.  2. No other acute intracranial abnormality. 3.  Underlying mild chronic microvascular ischemic disease.  THERAPY DIAG:  Cognitive communication deficit  Rationale for Evaluation and Treatment Rehabilitation  SUBJECTIVE: Pt in a good mood, motivated for therapy.   Pt accompanied by: self  PAIN:  Are you having pain? No  PATIENT GOALS: to improve memory   OBJECTIVE:   TODAY'S TREATMENT: Skilled treatment session focused on pt's language goals, specifically memory. SLP facilitated session by providing the following interventions:    Pt reported continued struggles of misuse of memory strategies, missing social events and not hearing alarms. SLP provided instruction in setting and labeling alarms on her phone to better aid in recalling events/important information. Pt stated she utilizes various strategies (alarm, alexa, calendar) "every once in a while." SLP provided education on the importance in consistency in utilizing memory aids/strategies in seeing a difference in improved ability to recall important information.   SLP recommended louder alarms to aid in pts report that, due to her erratic sleep schedule she frequently sleeps through alarms/alerts.   PATIENT EDUCATION: Education details: Pt educated on stroke location and potential deficits due to location of strokes.  Person educated: Patient Education method: Explanation, Verbal cues, and Handouts Education comprehension: verbalized understanding  HOME EXERCISE PROGRAM: N/A   GOALS: Goals reviewed with patient? Yes  SHORT TERM GOALS: Target date: 10 sessions  Pt will demonstrate comprehension of verbal information related to health conditions  with 75% accuracy and moderate cues.  Baseline: Goal status: INITIAL  2.  Pt will use external aids to manage appointments, chores, shopping in 4 out 6 opportunities with moderate cues.  Baseline:  Goal status: INITIAL  3.  Pt will complete moderately complex reasoning tasks with 75% accuracy in a reasonable amount of  time with double checking, given moderate cues.  Baseline:  Goal status: INITIAL   LONG TERM GOALS: Target date: 06/01/2022  Pt will use strategies to improve memory for important information with 75% acc. with Mod I (ie., white board, daily planner/calendar, Apps on phone).  Baseline:  Goal status: INITIAL  2.   With Mod I, Patient will demonstrate anticipatory awareness by identifying cognitive-communication barriers and using appropriate compensatory strategies in 75% of opportunities.  Baseline:  Goal status: INITIAL  3.  Pt will complete moderately complex reasoning tasks with > 90% accuracy in a reasonable amount of time with double checking, with use of strategies.  Baseline:  Goal status: INITIAL   ASSESSMENT:  CLINICAL IMPRESSION: Pt continues to misuse memory strategies and appears to need frequent encouragement/instruction to be consistent in building habits that will aid in memory.   OBJECTIVE IMPAIRMENTS  include attention, memory, and executive functioning. These impairments are limiting patient from managing medications, managing appointments, managing finances, household responsibilities, ADLs/IADLs, and effectively communicating at home and in community. Factors affecting potential to achieve goals and functional outcome are  needs further assessment .Marland Kitchen Patient will benefit from skilled SLP services to address above impairments and improve overall function.   REHAB POTENTIAL: Good  PLAN: SLP FREQUENCY: 1-2x/week  SLP DURATION: 12 weeks  PLANNED INTERVENTIONS: Cueing hierachy, Cognitive reorganization, Internal/external aids, Functional tasks, SLP instruction and feedback, Compensatory strategies, and Patient/family education   Alphonzo Grieve, SLP Graduate Clinician     Happi B. Rutherford Nail, M.S., CCC-SLP, Mining engineer Certified Brain Injury [place]  [place] Office  [phone] Ascom [phone] Fax [phone]

## 2022-03-31 ENCOUNTER — Ambulatory Visit: Payer: Medicare Other | Admitting: Speech Pathology

## 2022-03-31 DIAGNOSIS — R2681 Unsteadiness on feet: Secondary | ICD-10-CM | POA: Diagnosis not present

## 2022-03-31 DIAGNOSIS — I63412 Cerebral infarction due to embolism of left middle cerebral artery: Secondary | ICD-10-CM

## 2022-03-31 DIAGNOSIS — R41841 Cognitive communication deficit: Secondary | ICD-10-CM | POA: Diagnosis not present

## 2022-03-31 DIAGNOSIS — M6281 Muscle weakness (generalized): Secondary | ICD-10-CM | POA: Diagnosis not present

## 2022-03-31 DIAGNOSIS — R4701 Aphasia: Secondary | ICD-10-CM | POA: Diagnosis not present

## 2022-03-31 DIAGNOSIS — Z8673 Personal history of transient ischemic attack (TIA), and cerebral infarction without residual deficits: Secondary | ICD-10-CM | POA: Diagnosis not present

## 2022-04-01 DIAGNOSIS — Z170 Estrogen receptor positive status [ER+]: Secondary | ICD-10-CM | POA: Diagnosis not present

## 2022-04-01 DIAGNOSIS — C50212 Malignant neoplasm of upper-inner quadrant of left female breast: Secondary | ICD-10-CM | POA: Diagnosis not present

## 2022-04-02 ENCOUNTER — Ambulatory Visit: Payer: Medicare Other | Admitting: Speech Pathology

## 2022-04-02 ENCOUNTER — Ambulatory Visit: Payer: Medicare Other

## 2022-04-02 DIAGNOSIS — R4701 Aphasia: Secondary | ICD-10-CM | POA: Diagnosis not present

## 2022-04-02 DIAGNOSIS — I63412 Cerebral infarction due to embolism of left middle cerebral artery: Secondary | ICD-10-CM | POA: Diagnosis not present

## 2022-04-02 DIAGNOSIS — R41841 Cognitive communication deficit: Secondary | ICD-10-CM

## 2022-04-02 DIAGNOSIS — R2689 Other abnormalities of gait and mobility: Secondary | ICD-10-CM

## 2022-04-02 DIAGNOSIS — R2681 Unsteadiness on feet: Secondary | ICD-10-CM

## 2022-04-02 DIAGNOSIS — Z8673 Personal history of transient ischemic attack (TIA), and cerebral infarction without residual deficits: Secondary | ICD-10-CM | POA: Diagnosis not present

## 2022-04-02 DIAGNOSIS — R262 Difficulty in walking, not elsewhere classified: Secondary | ICD-10-CM

## 2022-04-02 DIAGNOSIS — M6281 Muscle weakness (generalized): Secondary | ICD-10-CM | POA: Diagnosis not present

## 2022-04-02 DIAGNOSIS — R269 Unspecified abnormalities of gait and mobility: Secondary | ICD-10-CM

## 2022-04-02 NOTE — Therapy (Signed)
OUTPATIENT PHYSICAL THERAPY NEURO TREATMENT   Patient Name: Natalie [name] MRN: [id number] DOB:July 27, 1950, 72 y.o., female Today's Date: 04/03/2022   PCP: [name], MD REFERRING PROVIDER: Vladimir Crofts, MD   END OF SESSION:  PT End of Session - 04/02/22 1301     Visit Number 3    Number of Visits 24    Date for PT Re-Evaluation 06/16/22    Authorization Type Traditional Medicare with [employer] Supplemental: VL based on certification    Authorization Time Period 03/24/22-06/16/22    Progress Note Due on Visit 10    PT Start Time 1300    PT Stop Time 1344    PT Time Calculation (min) 44 min    Activity Tolerance Patient tolerated treatment well;No increased pain    Behavior During Therapy WFL for tasks assessed/performed              Past Medical History:  Diagnosis Date   Acute bronchospasm due to viral infection 2020   Due to pollen   Acute ischemic left MCA stroke ([place]) 04/19/2020   a.) CTA head/neck 04/19/2020 --> nearly occlusive thrombus at bifurcation of a proximal M2 MCA branch approximately 6 mm from the MCA bifurcation. Subsequent occlusion of a proximal left M3 MCA branch   Allergy    Anemia    Angina at rest    Breast cancer, left ([place]) 08/21/2021   a.) stereotactic Bx 08/21/2021 --> IMC (G1, ER/PR +, Her2/neu -)   Cataract    a.) s/p extraction with IOL placement   Depressive disorder    Diverticulosis    Dyspnea    Epistaxis    Fatigue    Hearing loss    History of 2019 novel coronavirus disease (COVID-19) 12/18/2020   History of ischemic multifocal posterior circulation stroke 02/28/2020   a.) presented to ED with increasing episodes of  transient vertical binocular diplopia; MRI 02/27/2021 --> multiple punctate acute to subacute ischemic infarcts involving the cortical/subcortical aspects of both parieto-occipital regions   History of loop recorder    History of radiation therapy    Vagina-02/11/21-03/14/21- Dr. Gery Pray   HLD  (hyperlipidemia)    Hypothyroidism    LAFB (left anterior fascicular block)    Long term current use of antithrombotics/antiplatelets    a.) on DAPT therapy (ASA + clopidogrel)   Memory change    Migraine    Osteoarthritis    Osteoporosis    Palpitations    Premature menopause    QT prolongation    Seizures ([place]) 1994   history of mini seizures, possible migraine induced   Stage I adenocarcinoma of endometrium ([place]) 12/18/2020   a.) stage 1A; grade 1 (pT1a, pN0); b.) s/p TLH/BSO 12/18/2020 + adjuvant vaginal brachytherapy (30 Gy over 5 fractions between 02/11/2021 - 03/14/2021)   Vitamin D deficiency    Past Surgical History:  Procedure Laterality Date   ABDOMINAL HYSTERECTOMY  12/18/2020   ABLATION  2006   AXILLARY SENTINEL NODE BIOPSY Left 09/25/2021   Procedure: AXILLARY SENTINEL NODE BIOPSY;  Surgeon: Robert Bellow, MD;  Location: [facility] ORS;  Service: General;  Laterality: Left;   BREAST BIOPSY Left 08/21/2021   Stereo Bx, X-clip; path --> IMC (G1, ER/PR +, Her2/neu -)   BREAST LUMPECTOMY WITH NEEDLE LOCALIZATION Left 09/25/2021   Procedure: BREAST LUMPECTOMY WITH NEEDLE LOCALIZATION;  Surgeon: Robert Bellow, MD;  Location: [facility];  Service: General;  Laterality: Left;   BUBBLE STUDY  04/23/2020   Procedure: BUBBLE STUDY;  Surgeon: Larey Dresser, MD;  Location: [facility] ENDOSCOPY;  Service: Cardiovascular;;   CATARACT EXTRACTION W/ INTRAOCULAR LENS IMPLANT Bilateral    COLONOSCOPY     COLONOSCOPY WITH PROPOFOL N/A 12/22/2018   Procedure: COLONOSCOPY WITH PROPOFOL;  Surgeon: Virgel Manifold, MD;  Location: [facility] ENDOSCOPY;  Service: Endoscopy;  Laterality: N/A;   EYE SURGERY  Spring 2018   Cataracts both eyes   HYSTEROSCOPY WITH D & C N/A 11/01/2020   Procedure: DILATATION AND CURETTAGE /HYSTEROSCOPY WITH MYOSURE;  Surgeon: Lafonda Mosses, MD;  Location: [facility];  Service: Gynecology;  Laterality: N/A;   LAPAROTOMY  12/18/2020   Procedure: LAPAROTOMY;  Surgeon:  Lafonda Mosses, MD;  Location: [facility];  Service: Gynecology;;   LOOP RECORDER INSERTION N/A 04/23/2020   Procedure: LOOP RECORDER INSERTION;  Surgeon: Evans Lance, MD;  Location: [facility];  Service: Cardiovascular;  Laterality: N/A;   ROBOTIC ASSISTED TOTAL HYSTERECTOMY WITH BILATERAL SALPINGO OOPHERECTOMY Bilateral 12/18/2020   Procedure: XI ROBOTIC ASSISTED TOTAL HYSTERECTOMY WITH BILATERAL SALPINGO OOPHORECTOMY;  Surgeon: Lafonda Mosses, MD;  Location: [facility];  Service: Gynecology;  Laterality: Bilateral;   SENTINEL NODE BIOPSY N/A 12/18/2020   Procedure: SENTINEL NODE BIOPSY;  Surgeon: Lafonda Mosses, MD;  Location: [facility];  Service: Gynecology;  Laterality: N/A;   TEE WITHOUT CARDIOVERSION N/A 04/23/2020   Procedure: TRANSESOPHAGEAL ECHOCARDIOGRAM (TEE);  Surgeon: Larey Dresser, MD;  Location: [facility] ENDOSCOPY;  Service: Cardiovascular;  Laterality: N/A;   TONSILLECTOMY     Patient Active Problem List   Diagnosis Date Noted   Cerebrovascular accident (CVA) ([place]) 12/05/2021   Acute CVA (cerebrovascular accident) ([place]) 12/04/2021   Trigger finger, right ring finger 09/10/2021   Dupuytren's contracture of both hands 09/10/2021   Vitamin D deficiency 09/10/2021   Malignant neoplasm of left breast in female, estrogen receptor positive ([place]) 09/01/2021   Dyslipidemia 05/16/2021   Major depression, recurrent, chronic ([place]) 03/13/2021   Mild protein-calorie malnutrition ([place]) 03/13/2021   Endometrial cancer ([place]) 12/26/2020   Postoperative anemia due to acute blood loss 12/19/2020   History of CVA in adulthood 04/19/2020   History of ischemic multifocal posterior circulation stroke 02/29/2020   Difficulty hearing 06/28/2015   Arthritis, degenerative 06/28/2015   Paresthesia 06/28/2015   Purpura, nonthrombopenic ([place]) 06/28/2015   Adult hypothyroidism 08/09/2014   Migraine without aura and without status migrainosus, not intractable 09/19/2008   Moderate major  depression ([place]) 09/07/2006   OP (osteoporosis) 09/07/2006    ONSET DATE: 04/19/20  REFERRING DIAG: [id number] (ICD-10-CM) - Personal history of transient ischemic attack (TIA), and cerebral infarction without residual deficits   THERAPY DIAG:  Muscle weakness (generalized)  Unsteadiness on feet  Other abnormalities of gait and mobility  Abnormality of gait and mobility  Difficulty in walking, not elsewhere classified  Rationale for Evaluation and Treatment: Rehabilitation  SUBJECTIVE:  SUBJECTIVE STATEMENT: Pt reports having one episode of dizziness standing at [employer] setting up tables for dinner this evening. Reports feeling fine since then.   PERTINENT HISTORY: Pt is a 72 year old female with history of multiple strokes, migraine, "Subjective versus Mild Cognitive Impairment (word finding difficulty, short-term recall), in patient with positive family history of Alzheimer's Disease, elevated p-tau, unremarkable SNFL," moderate non-positional obstructive sleep apnea, subjective upper extremity weakness, slight ataxia, imbalance, decreased sensation to bilateral lower extremity, hyperreflexia as well as chemo radiation for endometrial cancer and recent radiation to her left breast.   PAIN:  Are you having pain? No  PRECAUTIONS: None  WEIGHT BEARING RESTRICTIONS: No  FALLS: Has patient fallen in last 6 months? Yes. Number of falls 1 fall   LIVING ENVIRONMENT:  Lives with:  Lives with DTR who drops in and drops in once in a week Lives in: House/apartment Stairs: Yes: Internal: 15 steps; on left going up and External: 5 steps; can reach both Has following equipment at home: Single point cane  PLOF: Independent  PATIENT GOALS: walking straight ( without veering), feel more comfortable walking  out and about in the park, improve walking speed.   OBJECTIVE:   TODAY'S TREATMENT:                                                                                                                              DATE:04/02/2022  vital signs: no symptoms during testing    Seated: 116/72 mmHg 61bpm Standing: 104/75 mmHg 68 bpm (0 minutes)  Standing: 118/82 mmHg  74 bpm (after 5 min)  Standing: 120/47mHg 67bpm   NMR:  Staggered stand on firm surface- hold 30 sec x 3 each side (more unsteady with left LE in rear) Step tap alt LE without UE support x 15 (more unsteady with standing on right LE)  Side Step up alt LE without UE Support x 15 reps Step tap on 3 colored cones- (PT would call out color and patient would tap appropriate cone) - more unsteady with placing left LE on top of cone Tandem standing x 20 sec x 2 each LE SLS - hold 5-10sec each LE x 3 Side step over 1/2 foam x 15 reps  Therex  Sit to stand x 10 reps without UE Support    PATIENT EDUCATION: Allow for adequate rest breaks when motor fatigue intereferes with quality work of balance training.   HOME EXERCISE PROGRAM: Access Code: [email or web address]: [email or web address] Date: 04/02/2022 Prepared by: JSande Brothers Exercises - Tandem Stance  - 3 x weekly - 3 sets - 20-30 sec hold - Single Leg Stance  - 3 x weekly - 3-4 sets - up to 20 sec hold    Access Code: [email or web address]  URL: [email or web address]  Date: 03/27/2022  Prepared by: JRaleigh Nation  Exercises -  Sit to Stand - 1 x daily - 4 x weekly - 3 sets - 10 reps -  Standing  Hip Abduction with Counter Support - 1 x daily - 4 x weekly - 3 sets - 15 reps -  Seated Heel Raise - 1 x daily - 4 x weekly - 3 sets - 15 reps -  Heel rises with counter support - 1 x daily - 4 x weekly - 3 sets - 15 reps -  Standing March with Counter Support - 1 x daily - 4 x weekly - 3 sets - 15 reps   GOALS: Goals reviewed with patient? Yes  SHORT TERM  GOALS: Target date: 04/22/2022       Patient will be independent in home exercise program to improve strength/mobility for better functional independence with ADLs. Baseline: No HEP currently  Goal status: INITIAL   LONG TERM GOALS: Target date: 06/17/2022      1.  Patient will increase FOTO score to equal to or greater than  55   to demonstrate statistically significant improvement in mobility and quality of life.  Baseline: 52 Goal status: INITIAL   2.  Patient will increase Mini Best Balance score by > 4 points to demonstrate decreased fall risk during functional activities. Baseline: 19 Goal status: INITIAL   3.   Patient will reduce timed up and go to <11 seconds to reduce fall risk and demonstrate improved transfer/gait ability. Baseline: 14.6 sec Goal status: INITIAL  4.   Patient will increase 10 meter walk test to >1.22ms as to improve gait speed for better community ambulation and to reduce fall risk. Baseline: .89 m/s Goal status: INITIAL  5.   Patient will increase six minute walk test distance to >1630 feet and with no signs of imbalance for progression to super community ambulator. Baseline: 03/27/22: [id number] no device, intermitent drifting/sway/crossover Goal status: REVISED     ASSESSMENT:  CLINICAL IMPRESSION:Patient presents with good motivation and responsive to all VC and visual demo for balance techniques. She was challenged with any narrowed base of support or SLS activity yet did improve with reps/practice today. Still significant unsteadiness requiring some UE Support and will benefit from continued balance training and review of todays exercises to be transitioned into her HEP. Patient will benefit from continued PT services to address impairments with balance, LE muscle weakness, and functional mobility to maximize her functional independence in home/community and decrease risk of falling.    OBJECTIVE IMPAIRMENTS: Abnormal gait, decreased  activity tolerance, decreased balance, decreased endurance, decreased mobility, difficulty walking, decreased ROM, decreased strength, and impaired perceived functional ability.   ACTIVITY LIMITATIONS: standing, squatting, stairs, and locomotion level  PARTICIPATION LIMITATIONS: cleaning, shopping, community activity, and yard work  PERSONAL FACTORS: Time since onset of injury/illness/exacerbation and 3+ comorbidities: ischemic stroke, cancer in remission, arthritis,   are also affecting patient's functional outcome.   REHAB POTENTIAL: Good  CLINICAL DECISION MAKING: Stable/uncomplicated  EVALUATION COMPLEXITY: Low  PLAN:  PT FREQUENCY: 1-2x/week  PT DURATION: 12 weeks  PLANNED INTERVENTIONS: Therapeutic exercises, Therapeutic activity, Neuromuscular re-education, Balance training, Gait training, Patient/Family education, Self Care, Joint mobilization, and Stair training  PLAN FOR NEXT SESSION: Continue to monitor vitals as appropriate, advance HEP with balance exercises, gait based balance  JeLewis MoccasinPT 04/03/2022, 8:16 AM  8:16 AM, 04/03/22 Physical Therapist - [phone]

## 2022-04-03 ENCOUNTER — Ambulatory Visit (INDEPENDENT_AMBULATORY_CARE_PROVIDER_SITE_OTHER): Payer: Medicare Other

## 2022-04-03 VITALS — BP 128/74 | Ht 65.0 in | Wt 151.2 lb

## 2022-04-03 DIAGNOSIS — Z0000 Encounter for general adult medical examination without abnormal findings: Secondary | ICD-10-CM | POA: Diagnosis not present

## 2022-04-03 NOTE — Therapy (Addendum)
OUTPATIENT SPEECH LANGUAGE PATHOLOGY TREATMENT NOTE   Patient Name: Natalie [name] MRN: [id number] DOB:1950/12/01, 72 y.o., female Today's Date: 03/31/2022  PCP: [name], MD REFERRING PROVIDER: Jennings Books, MD  END OF SESSION:   End of Session - 03/31/22 1257     Visit Number 5    Number of Visits 25    Date for SLP Re-Evaluation 06/01/22    Authorization Type Medicare A/Medicare B    Progress Note Due on Visit 10    SLP Start Time 1500    SLP Stop Time  1600    SLP Time Calculation (min) 60 min    Activity Tolerance Patient tolerated treatment well             Past Medical History:  Diagnosis Date   Acute bronchospasm due to viral infection 2020   Due to pollen   Acute ischemic left MCA stroke ([place]) 04/19/2020   a.) CTA head/neck 04/19/2020 --> nearly occlusive thrombus at bifurcation of a proximal M2 MCA branch approximately 6 mm from the MCA bifurcation. Subsequent occlusion of a proximal left M3 MCA branch   Allergy    Anemia    Angina at rest    Breast cancer, left ([place]) 08/21/2021   a.) stereotactic Bx 08/21/2021 --> IMC (G1, ER/PR +, Her2/neu -)   Cataract    a.) s/p extraction with IOL placement   Depressive disorder    Diverticulosis    Dyspnea    Epistaxis    Fatigue    Hearing loss    History of 2019 novel coronavirus disease (COVID-19) 12/18/2020   History of ischemic multifocal posterior circulation stroke 02/28/2020   a.) presented to ED with increasing episodes of  transient vertical binocular diplopia; MRI 02/27/2021 --> multiple punctate acute to subacute ischemic infarcts involving the cortical/subcortical aspects of both parieto-occipital regions   History of loop recorder    History of radiation therapy    Vagina-02/11/21-03/14/21- Dr. Gery Pray   HLD (hyperlipidemia)    Hypothyroidism    LAFB (left anterior fascicular block)    Long term current use of antithrombotics/antiplatelets    a.) on DAPT therapy (ASA + clopidogrel)    Memory change    Migraine    Osteoarthritis    Osteoporosis    Palpitations    Premature menopause    QT prolongation    Seizures ([place]) 1994   history of mini seizures, possible migraine induced   Stage I adenocarcinoma of endometrium ([place]) 12/18/2020   a.) stage 1A; grade 1 (pT1a, pN0); b.) s/p TLH/BSO 12/18/2020 + adjuvant vaginal brachytherapy (30 Gy over 5 fractions between 02/11/2021 - 03/14/2021)   Vitamin D deficiency    Past Surgical History:  Procedure Laterality Date   ABDOMINAL HYSTERECTOMY  12/18/2020   ABLATION  2006   AXILLARY SENTINEL NODE BIOPSY Left 09/25/2021   Procedure: AXILLARY SENTINEL NODE BIOPSY;  Surgeon: Robert Bellow, MD;  Location: [facility] ORS;  Service: General;  Laterality: Left;   BREAST BIOPSY Left 08/21/2021   Stereo Bx, X-clip; path --> IMC (G1, ER/PR +, Her2/neu -)   BREAST LUMPECTOMY WITH NEEDLE LOCALIZATION Left 09/25/2021   Procedure: BREAST LUMPECTOMY WITH NEEDLE LOCALIZATION;  Surgeon: Robert Bellow, MD;  Location: [facility] OR[facility];  Service: General;  Laterality: Left;   BUBBLE STUDY  04/23/2020   Procedure: BUBBLE STUDY;  Surgeon: Larey Dresser, MD;  Location: [facility] ENDOSCOPY;  Service: Cardiovascular;;   CATARACT EXTRACTION W/ INTRAOCULAR LENS IMPLANT Bilateral    COLONOSCOPY  COLONOSCOPY WITH PROPOFOL N/A 12/22/2018   Procedure: COLONOSCOPY WITH PROPOFOL;  Surgeon: Virgel Manifold, MD;  Location: [facility] ENDOSCOPY;  Service: Endoscopy;  Laterality: N/A;   EYE SURGERY  Spring 2018   Cataracts both eyes   HYSTEROSCOPY WITH D & C N/A 11/01/2020   Procedure: DILATATION AND CURETTAGE /HYSTEROSCOPY WITH MYOSURE;  Surgeon: Lafonda Mosses, MD;  Location: [facility]S;  Service: Gynecology;  Laterality: N/A;   LAPAROTOMY  12/18/2020   Procedure: LAPAROTOMY;  Surgeon: Lafonda Mosses, MD;  Location: [facility];  Service: Gynecology;;   LOOP RECORDER INSERTION N/A 04/23/2020   Procedure: LOOP RECORDER INSERTION;  Surgeon: Evans Lance, MD;   Location: [place] [facility];  Service: Cardiovascular;  Laterality: N/A;   ROBOTIC ASSISTED TOTAL HYSTERECTOMY WITH BILATERAL SALPINGO OOPHERECTOMY Bilateral 12/18/2020   Procedure: XI ROBOTIC ASSISTED TOTAL HYSTERECTOMY WITH BILATERAL SALPINGO OOPHORECTOMY;  Surgeon: Lafonda Mosses, MD;  Location: [facility];  Service: Gynecology;  Laterality: Bilateral;   SENTINEL NODE BIOPSY N/A 12/18/2020   Procedure: SENTINEL NODE BIOPSY;  Surgeon: Lafonda Mosses, MD;  Location: [facility];  Service: Gynecology;  Laterality: N/A;   TEE WITHOUT CARDIOVERSION N/A 04/23/2020   Procedure: TRANSESOPHAGEAL ECHOCARDIOGRAM (TEE);  Surgeon: Larey Dresser, MD;  Location[facility];  Service: Cardiovascular;  Laterality: N/A;   TONSILLECTOMY     Patient Active Problem List   Diagnosis Date Noted   Cerebrovascular accident (CVA) ([place]) 12/05/2021   Acute CVA (cerebrovascular accident) ([place]) 12/04/2021   Trigger finger, right ring finger 09/10/2021   Dupuytren's contracture of both hands 09/10/2021   Vitamin D deficiency 09/10/2021   Malignant neoplasm of left breast in female, estrogen receptor positive ([place]) 09/01/2021   Dyslipidemia 05/16/2021   Major depression, recurrent, chronic ([place]) 03/13/2021   Mild protein-calorie malnutrition ([place]) 03/13/2021   Endometrial cancer ([place]) 12/26/2020   Postoperative anemia due to acute blood loss 12/19/2020   History of CVA in adulthood 04/19/2020   History of ischemic multifocal posterior circulation stroke 02/29/2020   Difficulty hearing 06/28/2015   Arthritis, degenerative 06/28/2015   Paresthesia 06/28/2015   Purpura, nonthrombopenic ([place]) 06/28/2015   Adult hypothyroidism 08/09/2014   Migraine without aura and without status migrainosus, not intractable 09/19/2008   Moderate major depression ([place]) 09/07/2006   OP (osteoporosis) 09/07/2006    ONSET DATE: date of referral 03/11/2022    REFERRING DIAG: Z86.73 (ICD-10-CM) - Personal history of transient  ischemic attack (TIA), and cerebral infarction without residual deficits     PERTINENT HISTORY:  Pt is a 72 year old female with history of multiple strokes, migraine, "Subjective versus Mild Cognitive Impairment (word finding difficulty, short-term recall), in patient with positive family history of Alzheimer's Disease, elevated p-tau, unremarkable SNFL," moderate non-positional obstructive sleep apnea, subjective upper extremity weakness, slight ataxia, imbalance, decreased sensatio to bilateral lower extremity, hyperreflexia as well as chemo radiation for endometrial cancer and recent radiation to her left breast. .    DIAGNOSTIC FINDINGS:  12/05/2021 MRI   No acute infarction, hemorrhage, hydrocephalus, extra-axial collection or mass lesion. Small amount of scattered foci of T2 hyperintensity are seen within the white matter of the cerebral hemispheres and within the pons, nonspecific, most likely related to chronic small vessel ischemia. Remote cortical infarcts the left parietal and occipital lobes. Tiny remote infarct in the left thalamus.   07/17/2020 MRI There is no evidence of an acute infarct, mass, midline shift, or extra-axial fluid collection. Scattered small T2 hyperintensities in the cerebral white matter bilaterally are  nonspecific but compatible with mild chronic small vessel ischemic disease. There is mild encephalomalacia corresponding to the small posterior left MCA territory infarcts which were acute on the prior MRI. Chronic microhemorrhages are again noted posteriorly in both cerebral hemispheres. A chronic lacunar infarct in the left thalamus is unchanged from the prior MRI.   04/19/2020 MRI Generalized age appropriate cerebral volume. Mild chronic microvascular ischemic disease again noted.   Patchy small volume foci of restricted diffusion seen involving the cortical and subcortical aspect of the posterior left frontoparietal and temporal region,  consistent with posterior left MCA distribution infarcts. For reference purposes, the largest of these foci seen at the posterior left temporal region and measures 8 mm. Findings are embolic in distribution. No associated hemorrhage or mass effect.   Additional apparent subcentimeter focus of mild diffusion abnormality at the posterior right frontoparietal centrum semi ovale favored to reflect T2 shine through (series 2, image 34). No other evidence for acute or subacute ischemia. Gray-white matter differentiation otherwise maintained. No acute intracranial hemorrhage. Small focus of susceptibility artifact at the posterior right temporal region noted, consistent with a small chronic microhemorrhage. Additional minimal residual chronic blood products noted about the occipital regions related to recently identified ischemic infarcts.   02/28/2020 MRI Volume cerebral volume within normal limits for age. Minimal scattered patchy T2/FLAIR hyperintensity within the periventricular white matter and pons, most like related chronic microvascular ischemic disease, mild for age.   There are a few scattered punctate foci of diffusion abnormality seen involving the cortical/subcortical aspects of both parieto-occipital regions (series 5, images 29, 24, 20, 18), consistent with tiny acute to subacute ischemic infarcts. Associated minimal petechial hemorrhage (series 13, image 25). Subtle associated postcontrast enhancement consistent with subacute ischemia (series 32, images 113, 81). No malignant hemorrhagic transformation or significant regional mass effect. No evidence for subarachnoid hemorrhage within this region on prior head CT.   02/28/2020  Few scattered punctate acute to subacute ischemic infarcts involving the cortical/subcortical aspects of both parieto-occipital Regions consistent with tiny acute to subacute ischemic infarcts.  2. No other acute intracranial abnormality. 3.  Underlying mild chronic microvascular ischemic disease.  THERAPY DIAG:  Cognitive communication deficit  Personal history of transient ischemic attack (TIA), and cerebral infarction without residual deficits  Cerebrovascular accident (CVA) due to embolism of left middle cerebral artery ([place])  Rationale for Evaluation and Treatment Rehabilitation  SUBJECTIVE: Pt in a good mood, motivated for therapy.   Pt accompanied by: self  PAIN:  Are you having pain? No  PATIENT GOALS: to improve memory   OBJECTIVE:   TODAY'S TREATMENT: Skilled treatment session focused on pt's language goals, specifically memory. SLP facilitated session by providing the following interventions:   Extensive time spent and information shared with pt on the importance of creating a habit of using external/internal memory aids - CONSISTENT vs OCCASIONAL  SLP assisted pt with merging calendar on phone with Alexa for reminders by both as well as instruction    PATIENT EDUCATION: Education details: Pt educated on stroke location and potential deficits due to location of strokes.  Person educated: Patient Education method: Explanation, Verbal cues, and Handouts Education comprehension: verbalized understanding  HOME EXERCISE PROGRAM: Use Alexa for timer throughout the day   GOALS: Goals reviewed with patient? Yes  SHORT TERM GOALS: Target date: 10 sessions  Pt will demonstrate comprehension of verbal information related to health conditions with 75% accuracy and moderate cues.  Baseline: Goal status: INITIAL  2.  Pt will use external  aids to manage appointments, chores, shopping in 4 out 6 opportunities with moderate cues.  Baseline:  Goal status: INITIAL  3.  Pt will complete moderately complex reasoning tasks with 75% accuracy in a reasonable amount of time with double checking, given moderate cues.  Baseline:  Goal status: INITIAL   LONG TERM GOALS: Target date: 06/01/2022  Pt will use  strategies to improve memory for important information with 75% acc. with Mod I (ie., white board, daily planner/calendar, Apps on phone).  Baseline:  Goal status: INITIAL  2.   With Mod I, Patient will demonstrate anticipatory awareness by identifying cognitive-communication barriers and using appropriate compensatory strategies in 75% of opportunities.  Baseline:  Goal status: INITIAL  3.  Pt will complete moderately complex reasoning tasks with > 90% accuracy in a reasonable amount of time with double checking, with use of strategies.  Baseline:  Goal status: INITIAL   ASSESSMENT:  CLINICAL IMPRESSION: Pt with increased desire to create habitual use of memory aids.   OBJECTIVE IMPAIRMENTS  include attention, memory, and executive functioning. These impairments are limiting patient from managing medications, managing appointments, managing finances, household responsibilities, ADLs/IADLs, and effectively communicating at home and in community. Factors affecting potential to achieve goals and functional outcome are  needs further assessment .Marland Kitchen Patient will benefit from skilled SLP services to address above impairments and improve overall function.   REHAB POTENTIAL: Good  PLAN: SLP FREQUENCY: 1-2x/week  SLP DURATION: 12 weeks  PLANNED INTERVENTIONS: Cueing hierachy, Cognitive reorganization, Internal/external aids, Functional tasks, SLP instruction and feedback, Compensatory strategies, and Patient/family education   Dearia Wilmouth B. Rutherford Nail, M.S., CCC-SLP, Mining engineer Certified Brain Injury [place]  [place] Office [phone] Ascom [phone] Fax [phone]

## 2022-04-03 NOTE — Progress Notes (Signed)
Subjective:   Natalie [name] is a 72 y.o. female who presents for Medicare Annual (Subsequent) preventive examination.  Review of Systems    Cardiac Risk Factors include: advanced age (>50mn, >>29women);dyslipidemia    Objective:    Today's Vitals   04/03/22 1434  Weight: 151 lb 3.2 oz (68.6 kg)  Height: '5\' 5"'$  (1.651 m)   Body mass index is 25.16 kg/m.     04/03/2022    2:57 PM 12/30/2021    9:46 AM 12/05/2021   12:19 PM 12/05/2021   12:00 PM 11/28/2021    9:05 AM 10/02/2021    9:22 AM 09/25/2021   10:15 AM  Advanced Directives  Does Patient Have a Medical Advance Directive? Yes No  No No;Yes Yes Yes  Type of ACorporate treasurerof ABogotaLiving will   [place]anaLiving will HCamp HillLiving will [place]Living will  Does patient want to make changes to medical advance directive?  No - Patient declined     No - Patient declined  Copy of [place] Chart?  No - copy requested     Yes - validated most recent copy scanned in chart (See row information)  Would patient like information on creating a medical advance directive?  No - Patient declined No - Patient declined        Current Medications (verified) Outpatient Encounter Medications as of 04/03/2022  Medication Sig   alendronate (FOSAMAX) 70 MG tablet TAKE 1 TABLET(70 MG) BY MOUTH 1 TIME A WEEK WITH A FULL GLASS OF WATER AND ON AN EMPTY STOMACH   aspirin 81 MG EC tablet Take by mouth.   aspirin-acetaminophen-caffeine (EXCEDRIN MIGRAINE) 250-250-65 MG tablet Take by mouth every 6 (six) hours as needed for headache or migraine.   atorvastatin (LIPITOR) 40 MG tablet Take 1 tablet (40 mg total) by mouth daily.   buPROPion (WELLBUTRIN SR) 150 MG 12 hr tablet Take 1 tablet (150 mg total) by mouth 2 (two) times daily.   citalopram (CELEXA) 40 MG tablet TAKE 1 TABLET BY MOUTH EVERY DAY   clopidogrel (PLAVIX) 75 MG tablet Take 1  tablet (75 mg total) by mouth daily.   Cyanocobalamin (VITAMIN B-12) 1000 MCG SUBL Place 1 tablet (1,000 mcg total) under the tongue 2 (two) times a week.   levothyroxine (SYNTHROID) 75 MCG tablet TAKE 1 TABLET(75 MCG) BY MOUTH DAILY BEFORE BREAKFAST   Multiple Vitamin (MULTIVITAMIN WITH MINERALS) TABS tablet Take 1 tablet by mouth daily.   polyvinyl alcohol (LIQUIFILM TEARS) 1.4 % ophthalmic solution Place 1 drop into both eyes daily as needed for dry eyes.   Ubrogepant (UBRELVY) 100 MG TABS Take 100 mg by mouth daily as needed (migraines).   Vitamin D, Ergocalciferol, (DRISDOL) 1.25 MG (50000 UNIT) CAPS capsule Take 1 capsule (50,000 Units total) by mouth every 14 (fourteen) days.   No facility-administered encounter medications on file as of 04/03/2022.    Allergies (verified) Sulfamethoxazole-trimethoprim and Tnkase [tenecteplase]   History: Past Medical History:  Diagnosis Date   Acute bronchospasm due to viral infection 2020   Due to pollen   Acute ischemic left MCA stroke (HCrown 04/19/2020   a.) CTA head/neck 04/19/2020 --> nearly occlusive thrombus at bifurcation of a proximal M2 MCA branch approximately 6 mm from the MCA bifurcation. Subsequent occlusion of a proximal left M3 MCA branch   Allergy    Anemia    Angina at rest    Breast cancer,  left ([place]) 08/21/2021   a.) stereotactic Bx 08/21/2021 --> IMC (G1, ER/PR +, Her2/neu -)   Cataract    a.) s/p extraction with IOL placement   Depressive disorder    Diverticulosis    Dyspnea    Epistaxis    Fatigue    Hearing loss    History of 2019 novel coronavirus disease (COVID-19) 12/18/2020   History of ischemic multifocal posterior circulation stroke 02/28/2020   a.) presented to ED with increasing episodes of  transient vertical binocular diplopia; MRI 02/27/2021 --> multiple punctate acute to subacute ischemic infarcts involving the cortical/subcortical aspects of both parieto-occipital regions   History of loop recorder     History of radiation therapy    Vagina-02/11/21-03/14/21- Dr. Gery Pray   HLD (hyperlipidemia)    Hypothyroidism    LAFB (left anterior fascicular block)    Long term current use of antithrombotics/antiplatelets    a.) on DAPT therapy (ASA + clopidogrel)   Memory change    Migraine    Osteoarthritis    Osteoporosis    Palpitations    Premature menopause    QT prolongation    Seizures ([place]) 1994   history of mini seizures, possible migraine induced   Stage I adenocarcinoma of endometrium ([place]) 12/18/2020   a.) stage 1A; grade 1 (pT1a, pN0); b.) s/p TLH/BSO 12/18/2020 + adjuvant vaginal brachytherapy (30 Gy over 5 fractions between 02/11/2021 - 03/14/2021)   Vitamin D deficiency    Past Surgical History:  Procedure Laterality Date   ABDOMINAL HYSTERECTOMY  12/18/2020   ABLATION  2006   AXILLARY SENTINEL NODE BIOPSY Left 09/25/2021   Procedure: AXILLARY SENTINEL NODE BIOPSY;  Surgeon: Robert Bellow, MD;  Location: [facility];  Service: General;  Laterality: Left;   BREAST BIOPSY Left 08/21/2021   Stereo Bx, X-clip; path --> IMC (G1, ER/PR +, Her2/neu -)   BREAST LUMPECTOMY WITH NEEDLE LOCALIZATION Left 09/25/2021   Procedure: BREAST LUMPECTOMY WITH NEEDLE LOCALIZATION;  Surgeon: Robert Bellow, MD;  Location: [facility];  Service: General;  Laterality: Left;   BUBBLE STUDY  04/23/2020   Procedure: BUBBLE STUDY;  Surgeon: Larey Dresser, MD;  Location: [place];  Service: Cardiovascular;;   CATARACT EXTRACTION W/ INTRAOCULAR LENS IMPLANT Bilateral    COLONOSCOPY     COLONOSCOPY WITH PROPOFOL N/A 12/22/2018   Procedure: COLONOSCOPY WITH PROPOFOL;  Surgeon: Virgel Manifold, MD;  Location: [facility];  Service: Endoscopy;  Laterality: N/A;   EYE SURGERY  Spring 2018   Cataracts both eyes   HYSTEROSCOPY WITH D & C N/A 11/01/2020   Procedure: DILATATION AND CURETTAGE /HYSTEROSCOPY WITH MYOSURE;  Surgeon: Lafonda Mosses, MD;  Location: [facility];  Service:  Gynecology;  Laterality: N/A;   LAPAROTOMY  12/18/2020   Procedure: LAPAROTOMY;  Surgeon: Lafonda Mosses, MD;  Location: [facility];  Service: Gynecology;;   LOOP RECORDER INSERTION N/A 04/23/2020   Procedure: LOOP RECORDER INSERTION;  Surgeon: Evans Lance, MD;  Location: [facility];  Service: Cardiovascular;  Laterality: N/A;   ROBOTIC ASSISTED TOTAL HYSTERECTOMY WITH BILATERAL SALPINGO OOPHERECTOMY Bilateral 12/18/2020   Procedure: XI ROBOTIC ASSISTED TOTAL HYSTERECTOMY WITH BILATERAL SALPINGO OOPHORECTOMY;  Surgeon: Lafonda Mosses, MD;  Location: [facility];  Service: Gynecology;  Laterality: Bilateral;   SENTINEL NODE BIOPSY N/A 12/18/2020   Procedure: SENTINEL NODE BIOPSY;  Surgeon: Lafonda Mosses, MD;  Location: [facility];  Service: Gynecology;  Laterality: N/A;   TEE WITHOUT CARDIOVERSION N/A 04/23/2020   Procedure: TRANSESOPHAGEAL  ECHOCARDIOGRAM (TEE);  Surgeon: Larey Dresser, MD;  Location[facility];  Service: Cardiovascular;  Laterality: N/A;   TONSILLECTOMY     Family History  Problem Relation Age of Onset   Early death Father        suicide   Depression Father    Diabetes Father    Cancer Father        prostate   Hearing loss Father        due to war   Alzheimer's disease Mother    Heart disease Brother    Atrial fibrillation Brother    Heart disease Maternal Aunt    Dementia Maternal Aunt    Heart disease Maternal Uncle    Dementia Maternal Grandmother    Heart disease Brother    Atrial fibrillation Brother    Colon cancer Neg Hx    Breast cancer Neg Hx    Ovarian cancer Neg Hx    Pancreatic cancer Neg Hx    Endometrial cancer Neg Hx    Social History   Socioeconomic History   Marital status: Single    Spouse name: Not on file   Number of children: 1   Years of education: Not on file   Highest education level: Bachelor's degree (e.g., BA, AB, BS)  Occupational History   Occupation: retired   Tobacco Use   Smoking status: Former     Packs/day: 0.50    Years: 6.00    Total pack years: 3.00    Types: Cigarettes    Quit date: 02/11/1974    Years since quitting: 48.1   Smokeless tobacco: Never  Vaping Use   Vaping Use: Never used  Substance and Sexual Activity   Alcohol use: Not Currently   Drug use: Never   Sexual activity: Not Currently    Comment: Don't use not sexually active  Other Topics Concern   Not on file  Social History Narrative   Raised an adopted child on her own   Working part time reviewed documents   Social Determinants of Health   Financial Resource Strain: [place]  (04/03/2022)   Overall Financial Resource Strain (CARDIA)    Difficulty of Paying Living Expenses: Not very hard  Food Insecurity: No Food Insecurity (04/03/2022)   Hunger Vital Sign    Worried About Running Out of Food in the Last Year: Never true    [place] in the Last Year: Never true  Transportation Needs: No Transportation Needs (04/03/2022)   PRAPARE - Hydrologist (Medical): No    Lack of Transportation (Non-Medical): No  Physical Activity: Insufficiently Active (04/03/2022)   Exercise Vital Sign    Days of Exercise per Week: 2 days    Minutes of Exercise per Session: 30 min  Stress: No Stress Concern Present (04/03/2022)   [place]    Feeling of Stress : Not at all  Social Connections: Moderately Integrated (04/03/2022)   Social Connection and Isolation Panel [NHANES]    Frequency of Communication with Friends and Family: Three times a week    Frequency of Social Gatherings with Friends and Family: More than three times a week    Attends Religious Services: More than 4 times per year    Active Member of [employer] or Organizations: Yes    Attends Music therapist: More than 4 times per year    Marital Status: Never married    Tobacco Counseling Counseling  given: Not Answered   Clinical  Intake:  Pre-visit preparation completed: Yes  Pain : No/denies pain     BMI - recorded: 25.16 Nutritional Status: BMI 25 -29 Overweight Nutritional Risks: None Diabetes: No  How often do you need to have someone help you when you read instructions, pamphlets, or other written materials from your doctor or pharmacy?: 1 - Never  Diabetic?no  Interpreter Needed?: No  Information entered by :: B.Lennox Leikam,LPN   Activities of Daily Living    04/03/2022    2:57 PM 03/14/2022    9:38 AM  In your present state of health, do you have any difficulty performing the following activities:  Hearing? 1 1  Vision? 0 0  Difficulty concentrating or making decisions? 1 1  Walking or climbing stairs? 0 0  Dressing or bathing? 0 0  Doing errands, shopping? 0 0  Preparing Food and eating ? N   Using the Toilet? N   In the past six months, have you accidently leaked urine? N   Do you have problems with loss of bowel control? N   Managing your Medications? N   Managing your Finances? N   Housekeeping or managing your Housekeeping? N     Patient Care Team: [name], MD as PCP - General (Family Medicine) Gery Pray, MD as Consulting Physician (Radiation Oncology) Vladimir Crofts, MD as Consulting Physician (Neurology) Yolonda Kida, MD as Consulting Physician (Cardiology) Lafonda Mosses, MD as Consulting Physician (Gynecologic Oncology)  Indicate any recent Medical Services you may have received from other than [facility] providers in the past year (date may be approximate).     Assessment:   This is a routine wellness examination for [place].  Hearing/Vision screen Hearing Screening - Comments:: Inadequate hearing in crowds;has had hearing aid testing (not needed) Vision Screening - Comments:: Adequate vision after cataract surgery Dr Joya San  Dietary issues and exercise activities discussed: Exercise limited by: cardiac condition(s);orthopedic condition(s)   Goals  Addressed   None    Depression Screen    04/03/2022    2:51 PM 03/14/2022    9:38 AM 12/11/2021   11:27 AM 09/10/2021   10:56 AM 06/18/2021   10:22 AM 04/02/2021    2:25 PM 03/13/2021   10:11 AM  PHQ 2/9 Scores  PHQ - 2 Score 1 1 0 0 0 0 0  PHQ- 9 Score 3 3 0 0 4 2 0    Fall Risk    04/03/2022    2:47 PM 03/14/2022    9:38 AM 12/11/2021   11:26 AM 09/10/2021   10:56 AM 06/18/2021   10:22 AM  Fall Risk   Falls in the past year? 0 0 0 0 1  Number falls in past yr: 0  0 0 0  Injury with Fall? 0  0 0 1  Risk for fall due to : No Fall Risks No Fall Risks No Fall Risks No Fall Risks History of fall(s)  Follow up Education provided;Falls prevention discussed Falls prevention discussed Falls prevention discussed Falls prevention discussed Falls prevention discussed;Education provided;Falls evaluation completed    FALL RISK PREVENTION PERTAINING TO THE HOME:  Any stairs in or around the home? Yes  If so, are there any without handrails? Yes  Home free of loose throw rugs in walkways, pet beds, electrical cords, etc? Yes  Adequate lighting in your home to reduce risk of falls? Yes   ASSISTIVE DEVICES UTILIZED TO PREVENT FALLS:  Life alert? No apple watch Use  of a cane, walker or w/c? No  has cane if needed Grab bars in the bathroom? Yes  Shower chair or bench in shower? Yes  Elevated toilet seat or a handicapped toilet? Yes   TIMED UP AND GO:  Was the test performed? Yes .  Length of time to ambulate 10 feet: 8 sec.   Gait steady and fast without use of assistive device  Cognitive Function:    06/18/2021   10:00 AM  MMSE - Mini Mental State Exam  Orientation to time 5  Orientation to Place 5  Registration 3  Attention/ Calculation 5  Recall 3  Language- name 2 objects 2  Language- repeat 1  Language- follow 3 step command 3  Language- read & follow direction 1  Write a sentence 1  Copy design 1  Total score 30        04/03/2022    3:01 PM 12/20/2019    2:30 PM  11/18/2018   11:10 AM  6CIT Screen  What Year? 0 points 0 points 0 points  What month? 0 points 0 points 0 points  What time? 0 points 0 points 0 points  Count back from 20 0 points 0 points 0 points  Months in reverse 0 points 0 points 0 points  Repeat phrase 0 points 0 points 0 points  Total Score 0 points 0 points 0 points    Immunizations Immunization History  Administered Date(s) Administered   Fluad Quad(high Dose 65+) 11/16/2018, 11/09/2019, 11/01/2021   Influenza, High Dose Seasonal PF 11/02/2015, 11/03/2016, 11/24/2017   Influenza,inj,Quad PF,6+ Mos 10/26/2014   Moderna Covid-19 Vaccine Bivalent Booster 5yr & up 02/28/2021   Moderna SARS-COV2 Booster Vaccination 11/04/2021   PFIZER(Purple Top)SARS-COV-2 Vaccination 03/15/2019, 04/05/2019, 11/28/2019   Pneumococcal Conjugate-13 09/18/2016, 11/03/2016   Pneumococcal Polysaccharide-23 06/28/2015   RSV,unspecified 11/04/2021   Tdap 12/20/2012   Zoster Recombinat (Shingrix) 07/08/2017    TDAP status: Up to date  Flu Vaccine status: Up to date  Pneumococcal vaccine status: Up to date  Covid-19 vaccine status: Completed vaccines  Qualifies for Shingles Vaccine? Yes   Zostavax completed No   Shingrix Completed?: No.    Education has been provided regarding the importance of this vaccine. Patient has been advised to call insurance company to determine out of pocket expense if they have not yet received this vaccine. Advised may also receive vaccine at local pharmacy or Health Dept. Verbalized acceptance and understanding.  Screening Tests Health Maintenance  Topic Date Due   PAP SMEAR-Modifier  05/11/2021   COLONOSCOPY (Pts [phone] coverage will need to be confirmed)  12/21/2021   COVID-19 Vaccine (5 - 2023-24 season) 12/30/2021   Zoster Vaccines- Shingrix (2 of 2) 06/11/2022 (Originally 09/02/2017)   DTaP/Tdap/Td (2 - Td or Tdap) 12/21/2022   Medicare Annual Wellness (AWV)  04/03/2023   MAMMOGRAM   07/27/2023   Pneumonia Vaccine 6574Years old  Completed   INFLUENZA VACCINE  Completed   DEXA SCAN  Completed   Hepatitis C Screening  Completed   HPV VACCINES  Aged Out    Health Maintenance  Health Maintenance Due  Topic Date Due   PAP SMEAR-Modifier  05/11/2021   COLONOSCOPY (Pts [phone] coverage will need to be confirmed)  12/21/2021   COVID-19 Vaccine (5 - 2023-24 season) 12/30/2021    Colorectal cancer screening: Type of screening: Colonoscopy. Completed yes. Repeat every 5 years 11/23  Mammogram status: Completed yes. Repeat every year 09/2021  Bone Density status: Completed yes. Results  reflect: Bone density results: OSTEOPOROSIS. Repeat every 3 years.  Lung Cancer Screening: (Low Dose CT Chest recommended if Age 24-80 years, 30 pack-year currently smoking OR have quit w/in 15years.) does not qualify.   Lung Cancer Screening Referral: no  Additional Screening:  Hepatitis C Screening: does not qualify; Completed no  Vision Screening: Recommended annual ophthalmology exams for early detection of glaucoma and other disorders of the eye. Is the patient up to date with their annual eye exam?  Yes  Who is the provider or what is the name of the office in which the patient attends annual eye exams? Dr Joya San If pt is not established with a provider, would they like to be referred to a provider to establish care? No .   Dental Screening: Recommended annual dental exams for proper oral hygiene  Community Resource Referral / Chronic Care Management: CRR required this visit?  No   CCM required this visit?  No      Plan:     I have personally reviewed and noted the following in the patient's chart:   Medical and social history Use of alcohol, tobacco or illicit drugs  Current medications and supplements including opioid prescriptions. Patient is not currently taking opioid prescriptions. Functional ability and status Nutritional status Physical  activity Advanced directives List of other physicians Hospitalizations, surgeries, and ER visits in previous 12 months Vitals Screenings to include cognitive, depression, and falls Referrals and appointments  In addition, I have reviewed and discussed with patient certain preventive protocols, quality metrics, and best practice recommendations. A written personalized care plan for preventive services as well as general preventive health recommendations were provided to patient.     Roger Shelter, LPN   [id number]   Nurse Notes: pt is doing well:continues PT weekly. She has no concerns or questions at this time.

## 2022-04-03 NOTE — Therapy (Addendum)
OUTPATIENT SPEECH LANGUAGE PATHOLOGY TREATMENT NOTE   Patient Name: Natalie [name] MRN: [id number] DOB:28-Mar-1950, 72 y.o., female Today's Date: 04/03/2022   PCP: [name], MD REFERRING PROVIDER: Jennings Books, MD  END OF SESSION:   End of Session - 04/03/22 1945     Visit Number 6    Number of Visits 25    Date for SLP Re-Evaluation 06/01/22    Authorization Type Medicare A/Medicare B    Progress Note Due on Visit 10    SLP Start Time 1500    SLP Stop Time  1600    SLP Time Calculation (min) 60 min    Activity Tolerance Patient tolerated treatment well                  Past Medical History:  Diagnosis Date   Acute bronchospasm due to viral infection 2020   Due to pollen   Acute ischemic left MCA stroke ([place]) 04/19/2020   a.) CTA head/neck 04/19/2020 --> nearly occlusive thrombus at bifurcation of a proximal M2 MCA branch approximately 6 mm from the MCA bifurcation. Subsequent occlusion of a proximal left M3 MCA branch   Allergy    Anemia    Angina at rest    Breast cancer, left ([place]) 08/21/2021   a.) stereotactic Bx 08/21/2021 --> IMC (G1, ER/PR +, Her2/neu -)   Cataract    a.) s/p extraction with IOL placement   Depressive disorder    Diverticulosis    Dyspnea    Epistaxis    Fatigue    Hearing loss    History of 2019 novel coronavirus disease (COVID-19) 12/18/2020   History of ischemic multifocal posterior circulation stroke 02/28/2020   a.) presented to ED with increasing episodes of  transient vertical binocular diplopia; MRI 02/27/2021 --> multiple punctate acute to subacute ischemic infarcts involving the cortical/subcortical aspects of both parieto-occipital regions   History of loop recorder    History of radiation therapy    Vagina-02/11/21-03/14/21- Dr. Gery Pray   HLD (hyperlipidemia)    Hypothyroidism    LAFB (left anterior fascicular block)    Long term current use of antithrombotics/antiplatelets    a.) on DAPT therapy (ASA +  clopidogrel)   Memory change    Migraine    Osteoarthritis    Osteoporosis    Palpitations    Premature menopause    QT prolongation    Seizures ([place]) 1994   history of mini seizures, possible migraine induced   Stage I adenocarcinoma of endometrium ([place]) 12/18/2020   a.) stage 1A; grade 1 (pT1a, pN0); b.) s/p TLH/BSO 12/18/2020 + adjuvant vaginal brachytherapy (30 Gy over 5 fractions between 02/11/2021 - 03/14/2021)   Vitamin D deficiency    Past Surgical History:  Procedure Laterality Date   ABDOMINAL HYSTERECTOMY  12/18/2020   ABLATION  2006   AXILLARY SENTINEL NODE BIOPSY Left 09/25/2021   Procedure: AXILLARY SENTINEL NODE BIOPSY;  Surgeon: Robert Bellow, MD;  Location: [facility]S;  Service: General;  Laterality: Left;   BREAST BIOPSY Left 08/21/2021   Stereo Bx, X-clip; path --> IMC (G1, ER/PR +, Her2/neu -)   BREAST LUMPECTOMY WITH NEEDLE LOCALIZATION Left 09/25/2021   Procedure: BREAST LUMPECTOMY WITH NEEDLE LOCALIZATION;  Surgeon: Robert Bellow, MD;  Location: [facility];  Service: General;  Laterality: Left;   BUBBLE STUDY  04/23/2020   Procedure: BUBBLE STUDY;  Surgeon: Larey Dresser, MD;  Location: [facility] ENDOSCOPY;  Service: Cardiovascular;;   CATARACT EXTRACTION W/ INTRAOCULAR LENS IMPLANT Bilateral  COLONOSCOPY     COLONOSCOPY WITH PROPOFOL N/A 12/22/2018   Procedure: COLONOSCOPY WITH PROPOFOL;  Surgeon: Virgel Manifold, MD;  Location: [facility] ENDOSCOPY;  Service: Endoscopy;  Laterality: N/A;   EYE SURGERY  Spring 2018   Cataracts both eyes   HYSTEROSCOPY WITH D & C N/A 11/01/2020   Procedure: DILATATION AND CURETTAGE /HYSTEROSCOPY WITH MYOSURE;  Surgeon: Lafonda Mosses, MD;  Location: [facility];  Service: Gynecology;  Laterality: N/A;   LAPAROTOMY  12/18/2020   Procedure: LAPAROTOMY;  Surgeon: Lafonda Mosses, MD;  Location: [facility];  Service: Gynecology;;   LOOP RECORDER INSERTION N/A 04/23/2020   Procedure: LOOP RECORDER INSERTION;  Surgeon: Evans Lance, MD;  Location: [facility];  Service: Cardiovascular;  Laterality: N/A;   ROBOTIC ASSISTED TOTAL HYSTERECTOMY WITH BILATERAL SALPINGO OOPHERECTOMY Bilateral 12/18/2020   Procedure: XI ROBOTIC ASSISTED TOTAL HYSTERECTOMY WITH BILATERAL SALPINGO OOPHORECTOMY;  Surgeon: Lafonda Mosses, MD;  Location: [facility];  Service: Gynecology;  Laterality: Bilateral;   SENTINEL NODE BIOPSY N/A 12/18/2020   Procedure: SENTINEL NODE BIOPSY;  Surgeon: Lafonda Mosses, MD;  Location: [facility];  Service: Gynecology;  Laterality: N/A;   TEE WITHOUT CARDIOVERSION N/A 04/23/2020   Procedure: TRANSESOPHAGEAL ECHOCARDIOGRAM (TEE);  Surgeon: Larey Dresser, MD;  Location: [facility] ENDOSCOPY;  Service: Cardiovascular;  Laterality: N/A;   TONSILLECTOMY     Patient Active Problem List   Diagnosis Date Noted   Cerebrovascular accident (CVA) ([place]) 12/05/2021   Acute CVA (cerebrovascular accident) ([place]) 12/04/2021   Trigger finger, right ring finger 09/10/2021   Dupuytren's contracture of both hands 09/10/2021   Vitamin D deficiency 09/10/2021   Malignant neoplasm of left breast in female, estrogen receptor positive ([place]) 09/01/2021   Dyslipidemia 05/16/2021   Major depression, recurrent, chronic ([place]) 03/13/2021   Mild protein-calorie malnutrition ([place]) 03/13/2021   Endometrial cancer ([place]) 12/26/2020   Postoperative anemia due to acute blood loss 12/19/2020   History of CVA in adulthood 04/19/2020   History of ischemic multifocal posterior circulation stroke 02/29/2020   Difficulty hearing 06/28/2015   Arthritis, degenerative 06/28/2015   Paresthesia 06/28/2015   Purpura, nonthrombopenic ([place]) 06/28/2015   Adult hypothyroidism 08/09/2014   Migraine without aura and without status migrainosus, not intractable 09/19/2008   Moderate major depression ([place]) 09/07/2006   OP (osteoporosis) 09/07/2006    ONSET DATE: date of referral 03/11/2022    REFERRING DIAG: Z86.73 (ICD-10-CM) - Personal history  of transient ischemic attack (TIA), and cerebral infarction without residual deficits     PERTINENT HISTORY:  Pt is a 72 year old female with history of multiple strokes, migraine, "Subjective versus Mild Cognitive Impairment (word finding difficulty, short-term recall), in patient with positive family history of Alzheimer's Disease, elevated p-tau, unremarkable SNFL," moderate non-positional obstructive sleep apnea, subjective upper extremity weakness, slight ataxia, imbalance, decreased sensatio to bilateral lower extremity, hyperreflexia as well as chemo radiation for endometrial cancer and recent radiation to her left breast. .    DIAGNOSTIC FINDINGS:  12/05/2021 MRI   No acute infarction, hemorrhage, hydrocephalus, extra-axial collection or mass lesion. Small amount of scattered foci of T2 hyperintensity are seen within the white matter of the cerebral hemispheres and within the pons, nonspecific, most likely related to chronic small vessel ischemia. Remote cortical infarcts the left parietal and occipital lobes. Tiny remote infarct in the left thalamus.   07/17/2020 MRI There is no evidence of an acute infarct, mass, midline shift, or extra-axial fluid collection. Scattered small T2 hyperintensities in the  cerebral white matter bilaterally are nonspecific but compatible with mild chronic small vessel ischemic disease. There is mild encephalomalacia corresponding to the small posterior left MCA territory infarcts which were acute on the prior MRI. Chronic microhemorrhages are again noted posteriorly in both cerebral hemispheres. A chronic lacunar infarct in the left thalamus is unchanged from the prior MRI.   04/19/2020 MRI Generalized age appropriate cerebral volume. Mild chronic microvascular ischemic disease again noted.   Patchy small volume foci of restricted diffusion seen involving the cortical and subcortical aspect of the posterior left frontoparietal and temporal  region, consistent with posterior left MCA distribution infarcts. For reference purposes, the largest of these foci seen at the posterior left temporal region and measures 8 mm. Findings are embolic in distribution. No associated hemorrhage or mass effect.   Additional apparent subcentimeter focus of mild diffusion abnormality at the posterior right frontoparietal centrum semi ovale favored to reflect T2 shine through (series 2, image 34). No other evidence for acute or subacute ischemia. Gray-white matter differentiation otherwise maintained. No acute intracranial hemorrhage. Small focus of susceptibility artifact at the posterior right temporal region noted, consistent with a small chronic microhemorrhage. Additional minimal residual chronic blood products noted about the occipital regions related to recently identified ischemic infarcts.   02/28/2020 MRI Volume cerebral volume within normal limits for age. Minimal scattered patchy T2/FLAIR hyperintensity within the periventricular white matter and pons, most like related chronic microvascular ischemic disease, mild for age.   There are a few scattered punctate foci of diffusion abnormality seen involving the cortical/subcortical aspects of both parieto-occipital regions (series 5, images 29, 24, 20, 18), consistent with tiny acute to subacute ischemic infarcts. Associated minimal petechial hemorrhage (series 13, image 25). Subtle associated postcontrast enhancement consistent with subacute ischemia (series 32, images 113, 81). No malignant hemorrhagic transformation or significant regional mass effect. No evidence for subarachnoid hemorrhage within this region on prior head CT.   02/28/2020  Few scattered punctate acute to subacute ischemic infarcts involving the cortical/subcortical aspects of both parieto-occipital Regions consistent with tiny acute to subacute ischemic infarcts.  2. No other acute intracranial  abnormality. 3. Underlying mild chronic microvascular ischemic disease.  THERAPY DIAG:  Cognitive communication deficit  Personal history of transient ischemic attack (TIA), and cerebral infarction without residual deficits  Cerebrovascular accident (CVA) due to embolism of left middle cerebral artery ([place])  Rationale for Evaluation and Treatment Rehabilitation  SUBJECTIVE: "I HAVE ALEXA WORKING THROUGHOUT MY ENTIRE DAY"  Pt accompanied by: self  PAIN:  Are you having pain? No  PATIENT GOALS: to improve memory   OBJECTIVE:   TODAY'S TREATMENT: Skilled treatment session focused on pt's language goals, specifically memory. SLP facilitated session by providing the following interventions:     Pt arrived to session reporting that she was able to make [place] work throughout her entire day. She also reports consistent use of phone and provides list of cognitive stimulating activities that she participated in throughout her week.   Pt voiced concern over her lay reading of passages within her church services. Specifically, remembering which passages are needed and where to find them in [employer] [place]. She was able to find the recent bulletin on her phone and successfully emailed it to me. She will also bring in [employer] to next session.    Pt was Mod I with verbal description of church service as well as the order of service and her parts within that.   PATIENT EDUCATION: Education details:  Pt educated on stroke location and potential deficits due to location of strokes.  Person educated: Patient Education method: Explanation, Verbal cues, and Handouts Education comprehension: verbalized understanding  HOME EXERCISE PROGRAM: Continue using [place] in [place]: Goals reviewed with patient? Yes  SHORT TERM GOALS: Target date: 10 sessions  Pt will demonstrate comprehension of verbal information related to  health conditions with 75% accuracy and moderate cues.  Baseline: Goal status: INITIAL  2.  Pt will use external aids to manage appointments, chores, shopping in 4 out 6 opportunities with moderate cues.  Baseline:  Goal status: INITIAL  3.  Pt will complete moderately complex reasoning tasks with 75% accuracy in a reasonable amount of time with double checking, given moderate cues.  Baseline:  Goal status: INITIAL   LONG TERM GOALS: Target date: 06/01/2022  Pt will use strategies to improve memory for important information with 75% acc. with Mod I (ie., white board, daily planner/calendar, Apps on phone).  Baseline:  Goal status: INITIAL  2.   With Mod I, Patient will demonstrate anticipatory awareness by identifying cognitive-communication barriers and using appropriate compensatory strategies in 75% of opportunities.  Baseline:  Goal status: INITIAL  3.  Pt will complete moderately complex reasoning tasks with > 90% accuracy in a reasonable amount of time with double checking, with use of strategies.  Baseline:  Goal status: INITIAL   ASSESSMENT:  CLINICAL IMPRESSION: Pt presents with improving use of memory strategies and verbal description of events as well as improved clarity of struggles.   OBJECTIVE IMPAIRMENTS  include attention, memory, and executive functioning. These impairments are limiting patient from managing medications, managing appointments, managing finances, household responsibilities, ADLs/IADLs, and effectively communicating at home and in community. Factors affecting potential to achieve goals and functional outcome are  needs further assessment .Marland Kitchen Patient will benefit from skilled SLP services to address above impairments and improve overall function.   REHAB POTENTIAL: Good  PLAN: SLP FREQUENCY: 1-2x/week  SLP DURATION: 12 weeks  PLANNED INTERVENTIONS: Cueing hierachy, Cognitive reorganization, Internal/external aids, Functional tasks, SLP  instruction and feedback, Compensatory strategies, and Patient/family education  Skylen Danielsen B. Rutherford Nail, M.S., CCC-SLP, Mining engineer Certified Brain Injury [place]  [place] Office [phone] Ascom [phone] Fax [phone]

## 2022-04-03 NOTE — Patient Instructions (Addendum)
Natalie [name] , Thank you for taking time to come for your Medicare Wellness Visit. I appreciate your ongoing commitment to your health goals. Please review the following plan we discussed and let me know if I can assist you in the future.   These are the goals we discussed:  Goals      Chronic Care Management      Current Barriers:  Chronic Disease Management support and education needs related to elevated blood glucose, hypothyroidism and degenerative arthritis.  Case Manager Clinical Goal(s):  Over the next 90 days, patient will take all medications as prescribed. Over the next 90 days, patient will attend all scheduled medical appointments. Over the next 90 days, patient will continue adherence with recommended cardiac prudent diet. Over the next 90 days, patient will follow recommended safety measures to prevent falls and injuries.  Interventions:  Reviewed medications. Denies concerns regarding ability to self-manage medications. Willing to start medication for urinary urgency. Denies concerns regarding prescription costs.  Discussed dietary intake and heart healthy nutritional options. Discussed activity tolerance and safety measures. Reports recent fall due to accidentally stepping into a hole in the yard. Reports she was not injured and did not require medical evaluation. Otherwise doing well. No difficulties ambulating. No decline in activity tolerance. Reviewed pending medical appointments. Denies concerns regarding transportation. Discussed plans for ongoing care management follow up. Will follow-up regarding medication request.   Patient Self Care Activities:  Self administers medications Attends scheduled provider appointments Calls pharmacy for medication refills Performs ADL's independently Performs IADL's independently   Please see past updates related to this goal by clicking on the "Past Updates" button in the selected goal           This is a list of the  screening recommended for you and due dates:  Health Maintenance  Topic Date Due   Pap Smear  05/11/2021   Colon Cancer Screening  12/21/2021   COVID-19 Vaccine (5 - 2023-24 season) 12/30/2021   Zoster (Shingles) Vaccine (2 of 2) 06/11/2022*   DTaP/Tdap/Td vaccine (2 - Td or Tdap) 12/21/2022   Medicare Annual Wellness Visit  04/03/2023   Mammogram  07/27/2023   Pneumonia Vaccine  Completed   Flu Shot  Completed   DEXA scan (bone density measurement)  Completed   Hepatitis C Screening: USPSTF Recommendation to screen - Ages 103-79 yo.  Completed   HPV Vaccine  Aged Out  *Topic was postponed. The date shown is not the original due date.    Advanced directives: yes  Conditions/risks identified: none  Next appointment: Follow up in one year for your annual wellness visit 04/09/2023 '@1'$ :30pm In person   Preventive Care 65 Years and Older, Female Preventive care refers to lifestyle choices and visits with your health care provider that can promote health and wellness. What does preventive care include? A yearly physical exam. This is also called an annual well check. Dental exams once or twice a year. Routine eye exams. Ask your health care provider how often you should have your eyes checked. Personal lifestyle choices, including: Daily care of your teeth and gums. Regular physical activity. Eating a healthy diet. Avoiding tobacco and drug use. Limiting alcohol use. Practicing safe sex. Taking low-dose aspirin every day. Taking vitamin and mineral supplements as recommended by your health care provider. What happens during an annual well check? The services and screenings done by your health care provider during your annual well check will depend on your age, overall health, lifestyle risk factors,  and family history of disease. Counseling  Your health care provider may ask you questions about your: Alcohol use. Tobacco use. Drug use. Emotional well-being. Home and  relationship well-being. Sexual activity. Eating habits. History of falls. Memory and ability to understand (cognition). Work and work Statistician. Reproductive health. Screening  You may have the following tests or measurements: Height, weight, and BMI. Blood pressure. Lipid and cholesterol levels. These may be checked every 5 years, or more frequently if you are over 74 years old. Skin check. Lung cancer screening. You may have this screening every year starting at age 58 if you have a 30-pack-year history of smoking and currently smoke or have quit within the past 15 years. Fecal occult blood test (FOBT) of the stool. You may have this test every year starting at age 3. Flexible sigmoidoscopy or colonoscopy. You may have a sigmoidoscopy every 5 years or a colonoscopy every 10 years starting at age 30. Hepatitis C blood test. Hepatitis B blood test. Sexually transmitted disease (STD) testing. Diabetes screening. This is done by checking your blood sugar (glucose) after you have not eaten for a while (fasting). You may have this done every 1-3 years. Bone density scan. This is done to screen for osteoporosis. You may have this done starting at age 56. Mammogram. This may be done every 1-2 years. Talk to your health care provider about how often you should have regular mammograms. Talk with your health care provider about your test results, treatment options, and if necessary, the need for more tests. Vaccines  Your health care provider may recommend certain vaccines, such as: Influenza vaccine. This is recommended every year. Tetanus, diphtheria, and acellular pertussis (Tdap, Td) vaccine. You may need a Td booster every 10 years. Zoster vaccine. You may need this after age 41. Pneumococcal 13-valent conjugate (PCV13) vaccine. One dose is recommended after age 79. Pneumococcal polysaccharide (PPSV23) vaccine. One dose is recommended after age 40. Talk to your health care provider  about which screenings and vaccines you need and how often you need them. This information is not intended to replace advice given to you by your health care provider. Make sure you discuss any questions you have with your health care provider. Document Released: 02/16/2015 Document Revised: 10/10/2015 Document Reviewed: 11/21/2014 Elsevier Interactive Patient Education  2017 [employer] Prevention in the Home Falls can cause injuries. They can happen to people of all ages. There are many things you can do to make your home safe and to help prevent falls. What can I do on the outside of my home? Regularly fix the edges of walkways and driveways and fix any cracks. Remove anything that might make you trip as you walk through a door, such as a raised step or threshold. Trim any bushes or trees on the path to your home. Use bright outdoor lighting. Clear any walking paths of anything that might make someone trip, such as rocks or tools. Regularly check to see if handrails are loose or broken. Make sure that both sides of any steps have handrails. Any raised decks and porches should have guardrails on the edges. Have any leaves, snow, or ice cleared regularly. Use sand or salt on walking paths during winter. Clean up any spills in your garage right away. This includes oil or grease spills. What can I do in the bathroom? Use night lights. Install grab bars by the toilet and in the tub and shower. Do not use towel bars as grab bars. Use  non-skid mats or decals in the tub or shower. If you need to sit down in the shower, use a plastic, non-slip stool. Keep the floor dry. Clean up any water that spills on the floor as soon as it happens. Remove soap buildup in the tub or shower regularly. Attach bath mats securely with double-sided non-slip rug tape. Do not have throw rugs and other things on the floor that can make you trip. What can I do in the bedroom? Use night lights. Make sure  that you have a light by your bed that is easy to reach. Do not use any sheets or blankets that are too big for your bed. They should not hang down onto the floor. Have a firm chair that has side arms. You can use this for support while you get dressed. Do not have throw rugs and other things on the floor that can make you trip. What can I do in the kitchen? Clean up any spills right away. Avoid walking on wet floors. Keep items that you use a lot in easy-to-reach places. If you need to reach something above you, use a strong step stool that has a grab bar. Keep electrical cords out of the way. Do not use floor polish or wax that makes floors slippery. If you must use wax, use non-skid floor wax. Do not have throw rugs and other things on the floor that can make you trip. What can I do with my stairs? Do not leave any items on the stairs. Make sure that there are handrails on both sides of the stairs and use them. Fix handrails that are broken or loose. Make sure that handrails are as long as the stairways. Check any carpeting to make sure that it is firmly attached to the stairs. Fix any carpet that is loose or worn. Avoid having throw rugs at the top or bottom of the stairs. If you do have throw rugs, attach them to the floor with carpet tape. Make sure that you have a light switch at the top of the stairs and the bottom of the stairs. If you do not have them, ask someone to add them for you. What else can I do to help prevent falls? Wear shoes that: Do not have high heels. Have rubber bottoms. Are comfortable and fit you well. Are closed at the toe. Do not wear sandals. If you use a stepladder: Make sure that it is fully opened. Do not climb a closed stepladder. Make sure that both sides of the stepladder are locked into place. Ask someone to hold it for you, if possible. Clearly mark and make sure that you can see: Any grab bars or handrails. First and last steps. Where the edge of  each step is. Use tools that help you move around (mobility aids) if they are needed. These include: Canes. Walkers. Scooters. Crutches. Turn on the lights when you go into a dark area. Replace any light bulbs as soon as they burn out. Set up your furniture so you have a clear path. Avoid moving your furniture around. If any of your floors are uneven, fix them. If there are any pets around you, be aware of where they are. Review your medicines with your doctor. Some medicines can make you feel dizzy. This can increase your chance of falling. Ask your doctor what other things that you can do to help prevent falls. This information is not intended to replace advice given to you by your health care  provider. Make sure you discuss any questions you have with your health care provider. Document Released: 11/16/2008 Document Revised: 06/28/2015 Document Reviewed: 02/24/2014 Elsevier Interactive Patient Education  2017 [employer].

## 2022-04-07 DIAGNOSIS — R202 Paresthesia of skin: Secondary | ICD-10-CM | POA: Diagnosis not present

## 2022-04-07 DIAGNOSIS — R200 Anesthesia of skin: Secondary | ICD-10-CM | POA: Diagnosis not present

## 2022-04-07 DIAGNOSIS — G4733 Obstructive sleep apnea (adult) (pediatric): Secondary | ICD-10-CM | POA: Diagnosis not present

## 2022-04-08 ENCOUNTER — Ambulatory Visit: Payer: Medicare Other | Admitting: Speech Pathology

## 2022-04-08 ENCOUNTER — Ambulatory Visit: Payer: Medicare Other

## 2022-04-10 ENCOUNTER — Telehealth: Payer: Self-pay | Admitting: Physical Therapy

## 2022-04-10 ENCOUNTER — Other Ambulatory Visit: Payer: Self-pay

## 2022-04-10 ENCOUNTER — Emergency Department: Payer: Medicare Other

## 2022-04-10 ENCOUNTER — Ambulatory Visit: Payer: Medicare Other | Attending: Neurology | Admitting: Speech Pathology

## 2022-04-10 ENCOUNTER — Ambulatory Visit: Payer: Medicare Other | Admitting: Physical Therapy

## 2022-04-10 ENCOUNTER — Emergency Department
Admission: EM | Admit: 2022-04-10 | Discharge: 2022-04-10 | Disposition: A | Discharge status: Home / Self Care | Payer: Medicare Other | Attending: Emergency Medicine | Admitting: Emergency Medicine

## 2022-04-10 DIAGNOSIS — Z8673 Personal history of transient ischemic attack (TIA), and cerebral infarction without residual deficits: Secondary | ICD-10-CM

## 2022-04-10 DIAGNOSIS — R269 Unspecified abnormalities of gait and mobility: Secondary | ICD-10-CM | POA: Insufficient documentation

## 2022-04-10 DIAGNOSIS — H538 Other visual disturbances: Secondary | ICD-10-CM | POA: Insufficient documentation

## 2022-04-10 DIAGNOSIS — Z7982 Long term (current) use of aspirin: Secondary | ICD-10-CM | POA: Insufficient documentation

## 2022-04-10 DIAGNOSIS — R9431 Abnormal electrocardiogram [ECG] [EKG]: Secondary | ICD-10-CM | POA: Diagnosis not present

## 2022-04-10 DIAGNOSIS — R2681 Unsteadiness on feet: Secondary | ICD-10-CM | POA: Insufficient documentation

## 2022-04-10 DIAGNOSIS — Z91148 Patient's other noncompliance with medication regimen for other reason: Secondary | ICD-10-CM | POA: Insufficient documentation

## 2022-04-10 DIAGNOSIS — Z7902 Long term (current) use of antithrombotics/antiplatelets: Secondary | ICD-10-CM | POA: Diagnosis not present

## 2022-04-10 DIAGNOSIS — R42 Dizziness and giddiness: Secondary | ICD-10-CM | POA: Diagnosis not present

## 2022-04-10 DIAGNOSIS — G459 Transient cerebral ischemic attack, unspecified: Secondary | ICD-10-CM | POA: Insufficient documentation

## 2022-04-10 DIAGNOSIS — R2689 Other abnormalities of gait and mobility: Secondary | ICD-10-CM | POA: Insufficient documentation

## 2022-04-10 DIAGNOSIS — I63412 Cerebral infarction due to embolism of left middle cerebral artery: Secondary | ICD-10-CM | POA: Insufficient documentation

## 2022-04-10 DIAGNOSIS — E039 Hypothyroidism, unspecified: Secondary | ICD-10-CM | POA: Diagnosis not present

## 2022-04-10 DIAGNOSIS — R41841 Cognitive communication deficit: Secondary | ICD-10-CM

## 2022-04-10 DIAGNOSIS — R262 Difficulty in walking, not elsewhere classified: Secondary | ICD-10-CM | POA: Insufficient documentation

## 2022-04-10 DIAGNOSIS — M6281 Muscle weakness (generalized): Secondary | ICD-10-CM | POA: Insufficient documentation

## 2022-04-10 LAB — DIFFERENTIAL
Abs Immature Granulocytes: 0.01 10*3/uL (ref 0.00–0.07)
Basophils Absolute: 0 10*3/uL (ref 0.0–0.1)
Basophils Relative: 1 %
Eosinophils Absolute: 0.1 10*3/uL (ref 0.0–0.5)
Eosinophils Relative: 2 %
Immature Granulocytes: 0 %
Lymphocytes Relative: 29 %
Lymphs Abs: 1.2 10*3/uL (ref 0.7–4.0)
Monocytes Absolute: 0.3 10*3/uL (ref 0.1–1.0)
Monocytes Relative: 8 %
Neutro Abs: 2.5 10*3/uL (ref 1.7–7.7)
Neutrophils Relative %: 60 %

## 2022-04-10 LAB — CBC
HCT: 39.1 % (ref 36.0–46.0)
Hemoglobin: 12.9 g/dL (ref 12.0–15.0)
MCH: 31.9 pg (ref 26.0–34.0)
MCHC: 33 g/dL (ref 30.0–36.0)
MCV: 96.5 fL (ref 80.0–100.0)
Platelets: 196 10*3/uL (ref 150–400)
RBC: 4.05 MIL/uL (ref 3.87–5.11)
RDW: 11.5 % (ref 11.5–15.5)
WBC: 4.1 10*3/uL (ref 4.0–10.5)
nRBC: 0 % (ref 0.0–0.2)

## 2022-04-10 LAB — APTT: aPTT: 28 seconds (ref 24–36)

## 2022-04-10 LAB — COMPREHENSIVE METABOLIC PANEL
ALT: 27 U/L (ref 0–44)
AST: 28 U/L (ref 15–41)
Albumin: 4 g/dL (ref 3.5–5.0)
Alkaline Phosphatase: 57 U/L (ref 38–126)
Anion gap: 6 (ref 5–15)
BUN: 20 mg/dL (ref 8–23)
CO2: 25 mmol/L (ref 22–32)
Calcium: 8.9 mg/dL (ref 8.9–10.3)
Chloride: 105 mmol/L (ref 98–111)
Creatinine, Ser: 0.74 mg/dL (ref 0.44–1.00)
GFR, Estimated: >60 mL/min (ref >60)
Glucose, Bld: 83 mg/dL (ref 70–99)
Potassium: 4.1 mmol/L (ref 3.5–5.1)
Sodium: 136 mmol/L (ref 135–145)
Total Bilirubin: 1.1 mg/dL (ref 0.3–1.2)
Total Protein: 6.9 g/dL (ref 6.5–8.1)

## 2022-04-10 LAB — ETHANOL: Alcohol, Ethyl (B): <10 mg/dL (ref <10)

## 2022-04-10 LAB — PROTIME-INR
INR: 1.1 (ref 0.8–1.2)
Prothrombin Time: 13.9 seconds (ref 11.4–15.2)

## 2022-04-10 NOTE — ED Triage Notes (Signed)
First nurse note: Pt to ED after rapid response called from rehab center. Pt reports intermittent dizzy spells. Pt denies dizziness at this time. Pt reports hx CVA in November. Pt is on plavix. BP 153/84 and temp 97.8. Staff states pt seemed to be staring off into space briefly.

## 2022-04-10 NOTE — ED Notes (Signed)
Pt returned from MRI. Monitor applied. Daughter at the bedside. CB in reach.

## 2022-04-10 NOTE — Telephone Encounter (Signed)
Contacted patient regarding missed appointment on 04/09/21 and reminded her of appointment later this afternoon. Instructed pt to call clinic back at [phone] to follow up. All of the proceeding was left in voicemail.   Particia Lather  ,DPT Physical Therapist- [facility]

## 2022-04-10 NOTE — ED Notes (Signed)
Patient to MRI via rad staff.

## 2022-04-10 NOTE — ED Provider Notes (Signed)
[facility] Provider Note    Event Date/Time   First MD Initiated Contact with Patient 04/10/22 1726     (approximate)   History   Chief Complaint: Dizziness   HPI  Natalie [name] is a 72 y.o. female with a history of hypothyroidism, depression, stroke and migraine who is brought to the ED for evaluation due to visual disturbance while in speech therapy today.  Patient reports that she had a sudden onset of blurry vision, feeling like her vision was turning sideways, which lasted a few seconds and then resolved, but then happened a second time briefly.  Denies any headache vision changes or motor weakness.  No change in her balance or coordination.  No recent falls.  She is on aspirin and Plavix.      Physical Exam   Triage Vital Signs: ED Triage Vitals [04/10/22 1626]  Enc Vitals Group     BP (!) 162/84     Pulse Rate 65     Resp 17     Temp 97.8 F (36.6 C)     Temp Source Oral     SpO2 100 %     Weight 149 lb (67.6 kg)     Height      Head Circumference      Peak Flow      Pain Score 0     Pain Loc      Pain Edu?      Excl. in [place]?     Most recent vital signs: Vitals:   04/10/22 2106 04/10/22 2207  BP: (!) 142/86 (!) 112/48  Pulse: 64 72  Resp: 18 20  Temp: 98.1 F (36.7 C)   SpO2: 100% 100%    General: Awake, no distress.  CV:  Good peripheral perfusion.  Regular rate rhythm Resp:  Normal effort.  Clear to auscultation Abd:  No distention.  Other:  Clear manage 3 through 12 intact.  Neuro intact   ED Results / Procedures / Treatments   Labs (all labs ordered are listed, but only abnormal results are displayed) Labs Reviewed  PROTIME-INR  APTT  CBC  DIFFERENTIAL  COMPREHENSIVE METABOLIC PANEL  ETHANOL  I-STAT CREATININE, ED  CBG MONITORING, ED     EKG Interpreted by me Normal sinus rhythm rate of 67.  Normal axis, procedure AV block of 204 ms.  Poor R wave progression.  Normal ST segments and T  waves.   RADIOLOGY CT head interpreted by me, appears unremarkable.  Radiology report reviewed.   PROCEDURES:  Procedures   MEDICATIONS ORDERED IN ED: Medications - No data to display   IMPRESSION / MDM / [place] / ED COURSE  I reviewed the triage vital signs and the nursing notes.  DDx: Ocular migraine, intracranial hemorrhage, ischemic stroke dehydration, anemia, electrolyte abnormality  Patient's presentation is most consistent with acute presentation with potential threat to life or bodily function.  Patient presents with visual disturbance, brought to the ED due to her recent stroke history.  Currently she is feeling asymptomatic and at normal.  She is noncompliant with her medications.  Had a recent stroke 4 months ago for which she received tenecteplase.  CT head unremarkable.  Will obtain MRI.   ----------------------------------------- 10:56 PM on 04/10/2022 ----------------------------------------- MRI unremarkable.  Remains asymptomatic in the ED, stable for discharge.      FINAL CLINICAL IMPRESSION(S) / ED DIAGNOSES   Final diagnoses:  Dizziness     Rx / DC Orders  ED Discharge Orders     None        Note:  This document was prepared using Dragon voice recognition software and may include unintentional dictation errors.   Carrie Mew, MD 04/10/22 2256

## 2022-04-10 NOTE — ED Provider Notes (Signed)
-----------------------------------------   4:57 PM on 04/10/2022 -----------------------------------------   I received verbal report from radiology that the patient's CT may show an acute right temporal lobe infarct.  The patient is currently in the waiting room.  I contacted the charge RN Unk Lightning and instructed her to have the patient brought back to a room for evaluation by an MD.   Arta Silence, MD 04/10/22 [phone]

## 2022-04-10 NOTE — Discharge Instructions (Signed)
Your MRI of the brain was unremarkable today.  We do not see any signs of new stroke or other acute issues.

## 2022-04-10 NOTE — Therapy (Deleted)
OUTPATIENT PHYSICAL THERAPY NEURO TREATMENT   Patient Name: Natalie [name] MRN: [id number] DOB:May 03, 1950, 72 y.o., female Today's Date: 04/10/2022   PCP: [name], MD REFERRING PROVIDER: Vladimir Crofts, MD   END OF SESSION:     Past Medical History:  Diagnosis Date   Acute bronchospasm due to viral infection 2020   Due to pollen   Acute ischemic left MCA stroke ([place]) 04/19/2020   a.) CTA head/neck 04/19/2020 --> nearly occlusive thrombus at bifurcation of a proximal M2 MCA branch approximately 6 mm from the MCA bifurcation. Subsequent occlusion of a proximal left M3 MCA branch   Allergy    Anemia    Angina at rest    Breast cancer, left ([place]) 08/21/2021   a.) stereotactic Bx 08/21/2021 --> IMC (G1, ER/PR +, Her2/neu -)   Cataract    a.) s/p extraction with IOL placement   Depressive disorder    Diverticulosis    Dyspnea    Epistaxis    Fatigue    Hearing loss    History of 2019 novel coronavirus disease (COVID-19) 12/18/2020   History of ischemic multifocal posterior circulation stroke 02/28/2020   a.) presented to ED with increasing episodes of  transient vertical binocular diplopia; MRI 02/27/2021 --> multiple punctate acute to subacute ischemic infarcts involving the cortical/subcortical aspects of both parieto-occipital regions   History of loop recorder    History of radiation therapy    Vagina-02/11/21-03/14/21- Dr. Gery Pray   HLD (hyperlipidemia)    Hypothyroidism    LAFB (left anterior fascicular block)    Long term current use of antithrombotics/antiplatelets    a.) on DAPT therapy (ASA + clopidogrel)   Memory change    Migraine    Osteoarthritis    Osteoporosis    Palpitations    Premature menopause    QT prolongation    Seizures ([place]) 1994   history of mini seizures, possible migraine induced   Stage I adenocarcinoma of endometrium ([place]) 12/18/2020   a.) stage 1A; grade 1 (pT1a, pN0); b.) s/p TLH/BSO 12/18/2020 + adjuvant vaginal  brachytherapy (30 Gy over 5 fractions between 02/11/2021 - 03/14/2021)   Vitamin D deficiency    Past Surgical History:  Procedure Laterality Date   ABDOMINAL HYSTERECTOMY  12/18/2020   ABLATION  2006   AXILLARY SENTINEL NODE BIOPSY Left 09/25/2021   Procedure: AXILLARY SENTINEL NODE BIOPSY;  Surgeon: Robert Bellow, MD;  Location: [facility];  Service: General;  Laterality: Left;   BREAST BIOPSY Left 08/21/2021   Stereo Bx, X-clip; path --> IMC (G1, ER/PR +, Her2/neu -)   BREAST LUMPECTOMY WITH NEEDLE LOCALIZATION Left 09/25/2021   Procedure: BREAST LUMPECTOMY WITH NEEDLE LOCALIZATION;  Surgeon: Robert Bellow, MD;  Location: [facility];  Service: General;  Laterality: Left;   BUBBLE STUDY  04/23/2020   Procedure: BUBBLE STUDY;  Surgeon: Larey Dresser, MD;  Location: [facility];  Service: Cardiovascular;;   CATARACT EXTRACTION W/ INTRAOCULAR LENS IMPLANT Bilateral    COLONOSCOPY     COLONOSCOPY WITH PROPOFOL N/A 12/22/2018   Procedure: COLONOSCOPY WITH PROPOFOL;  Surgeon: Virgel Manifold, MD;  Location: [facility];  Service: Endoscopy;  Laterality: N/A;   EYE SURGERY  Spring 2018   Cataracts both eyes   HYSTEROSCOPY WITH D & C N/A 11/01/2020   Procedure: DILATATION AND CURETTAGE /HYSTEROSCOPY WITH MYOSURE;  Surgeon: Lafonda Mosses, MD;  Location: [facility];  Service: Gynecology;  Laterality: N/A;   LAPAROTOMY  12/18/2020   Procedure: LAPAROTOMY;  Surgeon:  Lafonda Mosses, MD;  Location: [facility];  Service: Gynecology;;   LOOP RECORDER INSERTION N/A 04/23/2020   Procedure: LOOP RECORDER INSERTION;  Surgeon: Evans Lance, MD;  Location: [facility];  Service: Cardiovascular;  Laterality: N/A;   ROBOTIC ASSISTED TOTAL HYSTERECTOMY WITH BILATERAL SALPINGO OOPHERECTOMY Bilateral 12/18/2020   Procedure: XI ROBOTIC ASSISTED TOTAL HYSTERECTOMY WITH BILATERAL SALPINGO OOPHORECTOMY;  Surgeon: Lafonda Mosses, MD;  Location: [facility];  Service: Gynecology;   Laterality: Bilateral;   SENTINEL NODE BIOPSY N/A 12/18/2020   Procedure: SENTINEL NODE BIOPSY;  Surgeon: Lafonda Mosses, MD;  Location: [facility];  Service: Gynecology;  Laterality: N/A;   TEE WITHOUT CARDIOVERSION N/A 04/23/2020   Procedure: TRANSESOPHAGEAL ECHOCARDIOGRAM (TEE);  Surgeon: Larey Dresser, MD;  Location: [facility];  Service: Cardiovascular;  Laterality: N/A;   TONSILLECTOMY     Patient Active Problem List   Diagnosis Date Noted   Cerebrovascular accident (CVA) ([place]) 12/05/2021   Acute CVA (cerebrovascular accident) ([place]) 12/04/2021   Trigger finger, right ring finger 09/10/2021   Dupuytren's contracture of both hands 09/10/2021   Vitamin D deficiency 09/10/2021   Malignant neoplasm of left breast in female, estrogen receptor positive ([place]) 09/01/2021   Dyslipidemia 05/16/2021   Major depression, recurrent, chronic ([place]) 03/13/2021   Mild protein-calorie malnutrition ([place]) 03/13/2021   Endometrial cancer ([place]) 12/26/2020   Postoperative anemia due to acute blood loss 12/19/2020   History of CVA in adulthood 04/19/2020   History of ischemic multifocal posterior circulation stroke 02/29/2020   Difficulty hearing 06/28/2015   Arthritis, degenerative 06/28/2015   Paresthesia 06/28/2015   Purpura, nonthrombopenic ([place]) 06/28/2015   Adult hypothyroidism 08/09/2014   Migraine without aura and without status migrainosus, not intractable 09/19/2008   Moderate major depression ([place]) 09/07/2006   OP (osteoporosis) 09/07/2006    ONSET DATE: 04/19/20  REFERRING DIAG: [id number] (ICD-10-CM) - Personal history of transient ischemic attack (TIA), and cerebral infarction without residual deficits   THERAPY DIAG:  Muscle weakness (generalized)  Unsteadiness on feet  Other abnormalities of gait and mobility  Abnormality of gait and mobility  Difficulty in walking, not elsewhere classified  Rationale for Evaluation and Treatment: Rehabilitation  SUBJECTIVE:                                                                                                                                                                                              SUBJECTIVE STATEMENT: Pt reports having one episode of dizziness standing at church setting up tables for dinner this evening. Reports feeling fine since then.   PERTINENT HISTORY: Pt is a 72 year old female  with history of multiple strokes, migraine, "Subjective versus Mild Cognitive Impairment (word finding difficulty, short-term recall), in patient with positive family history of Alzheimer's Disease, elevated p-tau, unremarkable SNFL," moderate non-positional obstructive sleep apnea, subjective upper extremity weakness, slight ataxia, imbalance, decreased sensation to bilateral lower extremity, hyperreflexia as well as chemo radiation for endometrial cancer and recent radiation to her left breast.   PAIN:  Are you having pain? No  PRECAUTIONS: None  WEIGHT BEARING RESTRICTIONS: No  FALLS: Has patient fallen in last 6 months? Yes. Number of falls 1 fall   LIVING ENVIRONMENT:  Lives with:  Lives with DTR who drops in and drops in once in a week Lives in: House/apartment Stairs: Yes: Internal: 15 steps; on left going up and External: 5 steps; can reach both Has following equipment at home: Single point cane  PLOF: Independent  PATIENT GOALS: walking straight ( without veering), feel more comfortable walking out and about in the park, improve walking speed.   OBJECTIVE:   TODAY'S TREATMENT:                                                                                                                              DATE:04/02/2022  vital signs: no symptoms during testing    Seated: 116/72 mmHg 61bpm Standing: 104/75 mmHg 68 bpm (0 minutes)  Standing: 118/82 mmHg  74 bpm (after 5 min)  Standing: 120/4mHg 67bpm   NMR:  Staggered stand on firm surface- hold 30 sec x 3 each side (more unsteady with left LE in  rear) Step tap alt LE without UE support x 15 (more unsteady with standing on right LE)  Side Step up alt LE without UE Support x 15 reps Step tap on 3 colored cones- (PT would call out color and patient would tap appropriate cone) - more unsteady with placing left LE on top of cone Tandem standing x 20 sec x 2 each LE SLS - hold 5-10sec each LE x 3 Side step over 1/2 foam x 15 reps  Therex  Sit to stand x 10 reps without UE Support    PATIENT EDUCATION: Allow for adequate rest breaks when motor fatigue intereferes with quality work of balance training.   HOME EXERCISE PROGRAM: Access Code: [email or web address]: [email or web address] Date: 04/02/2022 Prepared by: JSande Brothers Exercises - Tandem Stance  - 3 x weekly - 3 sets - 20-30 sec hold - Single Leg Stance  - 3 x weekly - 3-4 sets - up to 20 sec hold    Access Code: [email or web address]  URL: [email or web address]  Date: 03/27/2022  Prepared by: JRaleigh Nation  Exercises -  Sit to Stand - 1 x daily - 4 x weekly - 3 sets - 10 reps -  Standing Hip Abduction with Counter Support - 1 x daily - 4 x weekly - 3 sets - 15 reps -  Seated Heel Raise - 1 x daily - 4 x weekly - 3 sets -  15 reps -  Heel rises with counter support - 1 x daily - 4 x weekly - 3 sets - 15 reps -  Standing March with Counter Support - 1 x daily - 4 x weekly - 3 sets - 15 reps   GOALS: Goals reviewed with patient? Yes  SHORT TERM GOALS: Target date: 04/22/2022       Patient will be independent in home exercise program to improve strength/mobility for better functional independence with ADLs. Baseline: No HEP currently  Goal status: INITIAL   LONG TERM GOALS: Target date: 06/17/2022      1.  Patient will increase FOTO score to equal to or greater than  55   to demonstrate statistically significant improvement in mobility and quality of life.  Baseline: 52 Goal status: INITIAL   2.  Patient will increase Mini Best Balance  score by > 4 points to demonstrate decreased fall risk during functional activities. Baseline: 19 Goal status: INITIAL   3.   Patient will reduce timed up and go to <11 seconds to reduce fall risk and demonstrate improved transfer/gait ability. Baseline: 14.6 sec Goal status: INITIAL  4.   Patient will increase 10 meter walk test to >1.48ms as to improve gait speed for better community ambulation and to reduce fall risk. Baseline: .89 m/s Goal status: INITIAL  5.   Patient will increase six minute walk test distance to >1630 feet and with no signs of imbalance for progression to super community ambulator. Baseline: 03/27/22: [id number] no device, intermitent drifting/sway/crossover Goal status: REVISED     ASSESSMENT:  CLINICAL IMPRESSION:Patient presents with good motivation and responsive to all VC and visual demo for balance techniques. She was challenged with any narrowed base of support or SLS activity yet did improve with reps/practice today. Still significant unsteadiness requiring some UE Support and will benefit from continued balance training and review of todays exercises to be transitioned into her HEP. Patient will benefit from continued PT services to address impairments with balance, LE muscle weakness, and functional mobility to maximize her functional independence in home/community and decrease risk of falling.    OBJECTIVE IMPAIRMENTS: Abnormal gait, decreased activity tolerance, decreased balance, decreased endurance, decreased mobility, difficulty walking, decreased ROM, decreased strength, and impaired perceived functional ability.   ACTIVITY LIMITATIONS: standing, squatting, stairs, and locomotion level  PARTICIPATION LIMITATIONS: cleaning, shopping, community activity, and yard work  PERSONAL FACTORS: Time since onset of injury/illness/exacerbation and 3+ comorbidities: ischemic stroke, cancer in remission, arthritis,   are also affecting patient's functional  outcome.   REHAB POTENTIAL: Good  CLINICAL DECISION MAKING: Stable/uncomplicated  EVALUATION COMPLEXITY: Low  PLAN:  PT FREQUENCY: 1-2x/week  PT DURATION: 12 weeks  PLANNED INTERVENTIONS: Therapeutic exercises, Therapeutic activity, Neuromuscular re-education, Balance training, Gait training, Patient/Family education, Self Care, Joint mobilization, and Stair training  PLAN FOR NEXT SESSION: Continue to monitor vitals as appropriate, advance HEP with balance exercises, gait based balance  ChParticia LatherPT 04/10/2022, 10:50 AM  10:50 AM, 04/10/22 Physical Therapist - [phone]

## 2022-04-10 NOTE — ED Triage Notes (Signed)
Pt was a rapid response down in speech therapy. Per therapist pt sts " O no, o no" Pt sts that her vision was blurry and she was really dizzy. Pt is at baseline at this time. Pt takes Plavix daily with a baby aspirin.

## 2022-04-13 NOTE — Therapy (Signed)
OUTPATIENT SPEECH LANGUAGE PATHOLOGY TREATMENT NOTE   Patient Name: Natalie [name] MRN: [id number] DOB:[birth year], 72 y.o., female Today's Date: 04/10/2022   PCP: [name], MD REFERRING PROVIDER: Jennings Books, MD  END OF SESSION:   End of Session - 04/10/22 2010     Visit Number 7    Number of Visits 25    Date for SLP Re-Evaluation 06/01/22    Authorization Type Medicare A/Medicare B    Progress Note Due on Visit 10    SLP Start Time 1500    SLP Stop Time  1600    SLP Time Calculation (min) 60 min    Activity Tolerance Other (comment)   pt tolerated session well, however at end of session, pt began exhibiting s/s of potential stroke, CODE stroke called         Past Medical History:  Diagnosis Date   Acute bronchospasm due to viral infection 2020   Due to pollen   Acute ischemic left MCA stroke ([place]) 04/19/2020   a.) CTA head/neck 04/19/2020 --> nearly occlusive thrombus at bifurcation of a proximal M2 MCA branch approximately 6 mm from the MCA bifurcation. Subsequent occlusion of a proximal left M3 MCA branch   Allergy    Anemia    Angina at rest    Breast cancer, left ([place]) 08/21/2021   a.) stereotactic Bx 08/21/2021 --> IMC (G1, ER/PR +, Her2/neu -)   Cataract    a.) s/p extraction with IOL placement   Depressive disorder    Diverticulosis    Dyspnea    Epistaxis    Fatigue    Hearing loss    History of 2019 novel coronavirus disease (COVID-19) 12/18/2020   History of ischemic multifocal posterior circulation stroke 02/28/2020   a.) presented to ED with increasing episodes of  transient vertical binocular diplopia; MRI 02/27/2021 --> multiple punctate acute to subacute ischemic infarcts involving the cortical/subcortical aspects of both parieto-occipital regions   History of loop recorder    History of radiation therapy    Vagina-02/11/21-03/14/21- Dr. Gery Pray   HLD (hyperlipidemia)    Hypothyroidism    LAFB (left anterior fascicular block)     Long term current use of antithrombotics/antiplatelets    a.) on DAPT therapy (ASA + clopidogrel)   Memory change    Migraine    Osteoarthritis    Osteoporosis    Palpitations    Premature menopause    QT prolongation    Seizures ([place]) 1994   history of mini seizures, possible migraine induced   Stage I adenocarcinoma of endometrium ([place]) 12/18/2020   a.) stage 1A; grade 1 (pT1a, pN0); b.) s/p TLH/BSO 12/18/2020 + adjuvant vaginal brachytherapy (30 Gy over 5 fractions between 02/11/2021 - 03/14/2021)   Vitamin D deficiency    Past Surgical History:  Procedure Laterality Date   ABDOMINAL HYSTERECTOMY  12/18/2020   ABLATION  2006   AXILLARY SENTINEL NODE BIOPSY Left 09/25/2021   Procedure: AXILLARY SENTINEL NODE BIOPSY;  Surgeon: Robert Bellow, MD;  Location: [facility] ORS;  Service: General;  Laterality: Left;   BREAST BIOPSY Left 08/21/2021   Stereo Bx, X-clip; path --> IMC (G1, ER/PR +, Her2/neu -)   BREAST LUMPECTOMY WITH NEEDLE LOCALIZATION Left 09/25/2021   Procedure: BREAST LUMPECTOMY WITH NEEDLE LOCALIZATION;  Surgeon: Robert Bellow, MD;  Location: [facility];  Service: General;  Laterality: Left;   BUBBLE STUDY  04/23/2020   Procedure: BUBBLE STUDY;  Surgeon: Larey Dresser, MD;  Location: [facility] ENDOSCOPY;  Service:  Cardiovascular;;   CATARACT EXTRACTION W/ INTRAOCULAR LENS IMPLANT Bilateral    COLONOSCOPY     COLONOSCOPY WITH PROPOFOL N/A 12/22/2018   Procedure: COLONOSCOPY WITH PROPOFOL;  Surgeon: Virgel Manifold, MD;  Location: [facility] ENDOSCOPY;  Service: Endoscopy;  Laterality: N/A;   EYE SURGERY  Spring 2018   Cataracts both eyes   HYSTEROSCOPY WITH D & C N/A 11/01/2020   Procedure: DILATATION AND CURETTAGE /HYSTEROSCOPY WITH MYOSURE;  Surgeon: Lafonda Mosses, MD;  Location: [facility];  Service: Gynecology;  Laterality: N/A;   LAPAROTOMY  12/18/2020   Procedure: LAPAROTOMY;  Surgeon: Lafonda Mosses, MD;  Location: [facility];  Service: Gynecology;;   LOOP  RECORDER INSERTION N/A 04/23/2020   Procedure: LOOP RECORDER INSERTION;  Surgeon: Evans Lance, MD;  Location: [facility];  Service: Cardiovascular;  Laterality: N/A;   ROBOTIC ASSISTED TOTAL HYSTERECTOMY WITH BILATERAL SALPINGO OOPHERECTOMY Bilateral 12/18/2020   Procedure: XI ROBOTIC ASSISTED TOTAL HYSTERECTOMY WITH BILATERAL SALPINGO OOPHORECTOMY;  Surgeon: Lafonda Mosses, MD;  Location: [facility];  Service: Gynecology;  Laterality: Bilateral;   SENTINEL NODE BIOPSY N/A 12/18/2020   Procedure: SENTINEL NODE BIOPSY;  Surgeon: Lafonda Mosses, MD;  Location: [facility];  Service: Gynecology;  Laterality: N/A;   TEE WITHOUT CARDIOVERSION N/A 04/23/2020   Procedure: TRANSESOPHAGEAL ECHOCARDIOGRAM (TEE);  Surgeon: Larey Dresser, MD;  Location: [facility] ENDOSCOPY;  Service: Cardiovascular;  Laterality: N/A;   TONSILLECTOMY     Patient Active Problem List   Diagnosis Date Noted   Cerebrovascular accident (CVA) ([place]) 12/05/2021   Acute CVA (cerebrovascular accident) ([place]) 12/04/2021   Trigger finger, right ring finger 09/10/2021   Dupuytren's contracture of both hands 09/10/2021   Vitamin D deficiency 09/10/2021   Malignant neoplasm of left breast in female, estrogen receptor positive ([place]) 09/01/2021   Dyslipidemia 05/16/2021   Major depression, recurrent, chronic ([place]) 03/13/2021   Mild protein-calorie malnutrition ([place]) 03/13/2021   Endometrial cancer ([place]) 12/26/2020   Postoperative anemia due to acute blood loss 12/19/2020   History of CVA in adulthood 04/19/2020   History of ischemic multifocal posterior circulation stroke 02/29/2020   Difficulty hearing 06/28/2015   Arthritis, degenerative 06/28/2015   Paresthesia 06/28/2015   Purpura, nonthrombopenic ([place]) 06/28/2015   Adult hypothyroidism 08/09/2014   Migraine without aura and without status migrainosus, not intractable 09/19/2008   Moderate major depression ([place]) 09/07/2006   OP (osteoporosis) 09/07/2006    ONSET  DATE: date of referral 03/11/2022    REFERRING DIAG: Z86.73 (ICD-10-CM) - Personal history of transient ischemic attack (TIA), and cerebral infarction without residual deficits     PERTINENT HISTORY:  Pt is a 72 year old female with history of multiple strokes, migraine, "Subjective versus Mild Cognitive Impairment (word finding difficulty, short-term recall), in patient with positive family history of Alzheimer's Disease, elevated p-tau, unremarkable SNFL," moderate non-positional obstructive sleep apnea, subjective upper extremity weakness, slight ataxia, imbalance, decreased sensatio to bilateral lower extremity, hyperreflexia as well as chemo radiation for endometrial cancer and recent radiation to her left breast. .    DIAGNOSTIC FINDINGS:  12/05/2021 MRI   No acute infarction, hemorrhage, hydrocephalus, extra-axial collection or mass lesion. Small amount of scattered foci of T2 hyperintensity are seen within the white matter of the cerebral hemispheres and within the pons, nonspecific, most likely related to chronic small vessel ischemia. Remote cortical infarcts the left parietal and occipital lobes. Tiny remote infarct in the left thalamus.   07/17/2020 MRI There is no evidence of an acute infarct,  mass, midline shift, or extra-axial fluid collection. Scattered small T2 hyperintensities in the cerebral white matter bilaterally are nonspecific but compatible with mild chronic small vessel ischemic disease. There is mild encephalomalacia corresponding to the small posterior left MCA territory infarcts which were acute on the prior MRI. Chronic microhemorrhages are again noted posteriorly in both cerebral hemispheres. A chronic lacunar infarct in the left thalamus is unchanged from the prior MRI.   04/19/2020 MRI Generalized age appropriate cerebral volume. Mild chronic microvascular ischemic disease again noted.   Patchy small volume foci of restricted diffusion seen involving  the cortical and subcortical aspect of the posterior left frontoparietal and temporal region, consistent with posterior left MCA distribution infarcts. For reference purposes, the largest of these foci seen at the posterior left temporal region and measures 8 mm. Findings are embolic in distribution. No associated hemorrhage or mass effect.   Additional apparent subcentimeter focus of mild diffusion abnormality at the posterior right frontoparietal centrum semi ovale favored to reflect T2 shine through (series 2, image 34). No other evidence for acute or subacute ischemia. Gray-white matter differentiation otherwise maintained. No acute intracranial hemorrhage. Small focus of susceptibility artifact at the posterior right temporal region noted, consistent with a small chronic microhemorrhage. Additional minimal residual chronic blood products noted about the occipital regions related to recently identified ischemic infarcts.   02/28/2020 MRI Volume cerebral volume within normal limits for age. Minimal scattered patchy T2/FLAIR hyperintensity within the periventricular white matter and pons, most like related chronic microvascular ischemic disease, mild for age.   There are a few scattered punctate foci of diffusion abnormality seen involving the cortical/subcortical aspects of both parieto-occipital regions (series 5, images 29, 24, 20, 18), consistent with tiny acute to subacute ischemic infarcts. Associated minimal petechial hemorrhage (series 13, image 25). Subtle associated postcontrast enhancement consistent with subacute ischemia (series 32, images 113, 81). No malignant hemorrhagic transformation or significant regional mass effect. No evidence for subarachnoid hemorrhage within this region on prior head CT.   02/28/2020  Few scattered punctate acute to subacute ischemic infarcts involving the cortical/subcortical aspects of both parieto-occipital Regions consistent with  tiny acute to subacute ischemic infarcts.  2. No other acute intracranial abnormality. 3. Underlying mild chronic microvascular ischemic disease.  THERAPY DIAG:  Cognitive communication deficit  Personal history of transient ischemic attack (TIA), and cerebral infarction without residual deficits  Cerebrovascular accident (CVA) due to embolism of left middle cerebral artery (HCC)  History of ischemic multifocal posterior circulation stroke  Rationale for Evaluation and Treatment Rehabilitation  SUBJECTIVE: Pt brought in books for this writer to borrow. Also brought in her prayer book.   Pt accompanied by: self  PAIN:  Are you having pain? No  PATIENT GOALS: to improve memory   OBJECTIVE:   TODAY'S TREATMENT: Skilled treatment session focused on pt's language goals, specifically memory. SLP facilitated session by providing the following interventions:     Pt brought in her prayer book from church. With SLP assistance, pt able to read through several paragraph passages using slower rate to allow for precise use of articulation.   Instructed pt in reading/reciting paragraph level at slow rate to insure precise articulatory motor movements - focus on motor movements only, not presentation quality of readings   PATIENT EDUCATION: Education details: see above Person educated: Patient Education method: Explanation, Verbal cues, and Handouts Education comprehension: verbalized understanding  HOME EXERCISE PROGRAM: Continue using [place]; paragraph level readings found within [place]  GOALS: Goals reviewed with patient? Yes  SHORT TERM GOALS: Target date: 10 sessions  Pt will demonstrate comprehension of verbal information related to health conditions with 75% accuracy and moderate cues.  Baseline: Goal status: INITIAL  2.  Pt will use external aids to manage appointments, chores, shopping in 4 out 6 opportunities with moderate  cues.  Baseline:  Goal status: INITIAL  3.  Pt will complete moderately complex reasoning tasks with 75% accuracy in a reasonable amount of time with double checking, given moderate cues.  Baseline:  Goal status: INITIAL   LONG TERM GOALS: Target date: 06/01/2022  Pt will use strategies to improve memory for important information with 75% acc. with Mod I (ie., white board, daily planner/calendar, Apps on phone).  Baseline:  Goal status: INITIAL  2.   With Mod I, Patient will demonstrate anticipatory awareness by identifying cognitive-communication barriers and using appropriate compensatory strategies in 75% of opportunities.  Baseline:  Goal status: INITIAL  3.  Pt will complete moderately complex reasoning tasks with > 90% accuracy in a reasonable amount of time with double checking, with use of strategies.  Baseline:  Goal status: INITIAL   ASSESSMENT:  CLINICAL IMPRESSION: At the end of session, pt began to exhibit s/s of potential stroke c/b dizziness and visual disturbances. Pt stated these were symptoms she experienced during previous strokes. SLP activated CODE STROKE. Pt taken to ED for team. SLP accompanied and called pt's daughter to inform of current situation.   OBJECTIVE IMPAIRMENTS  include attention, memory, and executive functioning. These impairments are limiting patient from managing medications, managing appointments, managing finances, household responsibilities, ADLs/IADLs, and effectively communicating at home and in community. Factors affecting potential to achieve goals and functional outcome are  needs further assessment .Marland Kitchen Patient will benefit from skilled SLP services to address above impairments and improve overall function.   REHAB POTENTIAL: Good  PLAN: SLP FREQUENCY: 1-2x/week  SLP DURATION: 12 weeks  PLANNED INTERVENTIONS: Cueing hierachy, Cognitive reorganization, Internal/external aids, Functional tasks, SLP instruction and feedback,  Compensatory strategies, and Patient/family education  Sanjiv Castorena B. Rutherford Nail, M.S., CCC-SLP, Mining engineer Certified Brain Injury [place]  [place] Office [phone] Ascom [phone] Fax [phone]

## 2022-04-14 ENCOUNTER — Ambulatory Visit: Payer: Medicare Other | Admitting: Physical Therapy

## 2022-04-14 ENCOUNTER — Encounter: Payer: Self-pay | Admitting: Physical Therapy

## 2022-04-14 ENCOUNTER — Ambulatory Visit: Payer: Medicare Other | Admitting: Speech Pathology

## 2022-04-14 DIAGNOSIS — R2681 Unsteadiness on feet: Secondary | ICD-10-CM

## 2022-04-14 DIAGNOSIS — I63412 Cerebral infarction due to embolism of left middle cerebral artery: Secondary | ICD-10-CM | POA: Diagnosis not present

## 2022-04-14 DIAGNOSIS — M6281 Muscle weakness (generalized): Secondary | ICD-10-CM

## 2022-04-14 DIAGNOSIS — R262 Difficulty in walking, not elsewhere classified: Secondary | ICD-10-CM

## 2022-04-14 DIAGNOSIS — R2689 Other abnormalities of gait and mobility: Secondary | ICD-10-CM | POA: Diagnosis not present

## 2022-04-14 DIAGNOSIS — R41841 Cognitive communication deficit: Secondary | ICD-10-CM

## 2022-04-14 DIAGNOSIS — R269 Unspecified abnormalities of gait and mobility: Secondary | ICD-10-CM

## 2022-04-14 DIAGNOSIS — Z8673 Personal history of transient ischemic attack (TIA), and cerebral infarction without residual deficits: Secondary | ICD-10-CM | POA: Diagnosis not present

## 2022-04-14 NOTE — Therapy (Signed)
OUTPATIENT PHYSICAL THERAPY NEURO TREATMENT   Patient Name: Natalie [name] MRN: [id number] DOB:04/08/1950, 71 y.o., female Today's Date: 04/14/2022   PCP: [name], MD REFERRING PROVIDER: Vladimir Crofts, MD   END OF SESSION:  PT End of Session - 04/14/22 1603     Visit Number 4    Number of Visits 24    Date for PT Re-Evaluation 06/16/22    Authorization Type Traditional Medicare with [employer] Supplemental: VL based on certification    Authorization Time Period 03/24/22-06/16/22    Progress Note Due on Visit 10    PT Start Time 1603    PT Stop Time 1645    PT Time Calculation (min) 42 min    Equipment Utilized During Treatment Gait belt    Activity Tolerance Patient tolerated treatment well;No increased pain    Behavior During Therapy WFL for tasks assessed/performed               Past Medical History:  Diagnosis Date   Acute bronchospasm due to viral infection 2020   Due to pollen   Acute ischemic left MCA stroke ([place]) 04/19/2020   a.) CTA head/neck 04/19/2020 --> nearly occlusive thrombus at bifurcation of a proximal M2 MCA branch approximately 6 mm from the MCA bifurcation. Subsequent occlusion of a proximal left M3 MCA branch   Allergy    Anemia    Angina at rest    Breast cancer, left ([place]) 08/21/2021   a.) stereotactic Bx 08/21/2021 --> IMC (G1, ER/PR +, Her2/neu -)   Cataract    a.) s/p extraction with IOL placement   Depressive disorder    Diverticulosis    Dyspnea    Epistaxis    Fatigue    Hearing loss    History of 2019 novel coronavirus disease (COVID-19) 12/18/2020   History of ischemic multifocal posterior circulation stroke 02/28/2020   a.) presented to ED with increasing episodes of  transient vertical binocular diplopia; MRI 02/27/2021 --> multiple punctate acute to subacute ischemic infarcts involving the cortical/subcortical aspects of both parieto-occipital regions   History of loop recorder    History of radiation therapy     Vagina-02/11/21-03/14/21- Dr. Gery Pray   HLD (hyperlipidemia)    Hypothyroidism    LAFB (left anterior fascicular block)    Long term current use of antithrombotics/antiplatelets    a.) on DAPT therapy (ASA + clopidogrel)   Memory change    Migraine    Osteoarthritis    Osteoporosis    Palpitations    Premature menopause    QT prolongation    Seizures ([place]) 1994   history of mini seizures, possible migraine induced   Stage I adenocarcinoma of endometrium ([place]) 12/18/2020   a.) stage 1A; grade 1 (pT1a, pN0); b.) s/p TLH/BSO 12/18/2020 + adjuvant vaginal brachytherapy (30 Gy over 5 fractions between 02/11/2021 - 03/14/2021)   Vitamin D deficiency    Past Surgical History:  Procedure Laterality Date   ABDOMINAL HYSTERECTOMY  12/18/2020   ABLATION  2006   AXILLARY SENTINEL NODE BIOPSY Left 09/25/2021   Procedure: AXILLARY SENTINEL NODE BIOPSY;  Surgeon: Robert Bellow, MD;  Location: [facility] ORS;  Service: General;  Laterality: Left;   BREAST BIOPSY Left 08/21/2021   Stereo Bx, X-clip; path --> IMC (G1, ER/PR +, Her2/neu -)   BREAST LUMPECTOMY WITH NEEDLE LOCALIZATION Left 09/25/2021   Procedure: BREAST LUMPECTOMY WITH NEEDLE LOCALIZATION;  Surgeon: Robert Bellow, MD;  Location: [facility];  Service: General;  Laterality: Left;  BUBBLE STUDY  04/23/2020   Procedure: BUBBLE STUDY;  Surgeon: Larey Dresser, MD;  Location: [facility] ENDOSCOPY;  Service: Cardiovascular;;   CATARACT EXTRACTION W/ INTRAOCULAR LENS IMPLANT Bilateral    COLONOSCOPY     COLONOSCOPY WITH PROPOFOL N/A 12/22/2018   Procedure: COLONOSCOPY WITH PROPOFOL;  Surgeon: Virgel Manifold, MD;  Location: [facility] ENDOSCOPY;  Service: Endoscopy;  Laterality: N/A;   EYE SURGERY  Spring 2018   Cataracts both eyes   HYSTEROSCOPY WITH D & C N/A 11/01/2020   Procedure: DILATATION AND CURETTAGE /HYSTEROSCOPY WITH MYOSURE;  Surgeon: Lafonda Mosses, MD;  Location: [facility];  Service: Gynecology;  Laterality: N/A;    LAPAROTOMY  12/18/2020   Procedure: LAPAROTOMY;  Surgeon: Lafonda Mosses, MD;  Location: [facility];  Service: Gynecology;;   LOOP RECORDER INSERTION N/A 04/23/2020   Procedure: LOOP RECORDER INSERTION;  Surgeon: Evans Lance, MD;  Location: [place] CV LAB;  Service: Cardiovascular;  Laterality: N/A;   ROBOTIC ASSISTED TOTAL HYSTERECTOMY WITH BILATERAL SALPINGO OOPHERECTOMY Bilateral 12/18/2020   Procedure: XI ROBOTIC ASSISTED TOTAL HYSTERECTOMY WITH BILATERAL SALPINGO OOPHORECTOMY;  Surgeon: Lafonda Mosses, MD;  [facility];  Service: Gynecology;  Laterality: Bilateral;   SENTINEL NODE BIOPSY N/A 12/18/2020   Procedure: SENTINEL NODE BIOPSY;  Surgeon: Lafonda Mosses, MD;  [facility];  Service: Gynecology;  Laterality: N/A;   TEE WITHOUT CARDIOVERSION N/A 04/23/2020   Procedure: TRANSESOPHAGEAL ECHOCARDIOGRAM (TEE);  Surgeon: Larey Dresser, MD;  Location: [facility];  Service: Cardiovascular;  Laterality: N/A;   TONSILLECTOMY     Patient Active Problem List   Diagnosis Date Noted   Cerebrovascular accident (CVA) ([place]) 12/05/2021   Acute CVA (cerebrovascular accident) ([place]) 12/04/2021   Trigger finger, right ring finger 09/10/2021   Dupuytren's contracture of both hands 09/10/2021   Vitamin D deficiency 09/10/2021   Malignant neoplasm of left breast in female, estrogen receptor positive ([place]) 09/01/2021   Dyslipidemia 05/16/2021   Major depression, recurrent, chronic ([place]) 03/13/2021   Mild protein-calorie malnutrition ([place]) 03/13/2021   Endometrial cancer ([place]) 12/26/2020   Postoperative anemia due to acute blood loss 12/19/2020   History of CVA in adulthood 04/19/2020   History of ischemic multifocal posterior circulation stroke 02/29/2020   Difficulty hearing 06/28/2015   Arthritis, degenerative 06/28/2015   Paresthesia 06/28/2015   Purpura, nonthrombopenic ([place]) 06/28/2015   Adult hypothyroidism 08/09/2014   Migraine without aura and without status  migrainosus, not intractable 09/19/2008   Moderate major depression ([place]) 09/07/2006   OP (osteoporosis) 09/07/2006    ONSET DATE: 04/19/20  REFERRING DIAG: [id number] (ICD-10-CM) - Personal history of transient ischemic attack (TIA), and cerebral infarction without residual deficits   THERAPY DIAG:  Unsteadiness on feet  Other abnormalities of gait and mobility  Difficulty in walking, not elsewhere classified  Abnormality of gait and mobility  Muscle weakness (generalized)  Rationale for Evaluation and Treatment: Rehabilitation  SUBJECTIVE:  SUBJECTIVE STATEMENT: Patient reports she had to go to the emergency department last week secondary to an alteration in her perception.  Patient reports no significant findings as a result and went home with improved feelings of this but still feels like it continue to be an issue in the future.   PERTINENT HISTORY: Pt is a 72 year old female with history of multiple strokes, migraine, "Subjective versus Mild Cognitive Impairment (word finding difficulty, short-term recall), in patient with positive family history of Alzheimer's Disease, elevated p-tau, unremarkable SNFL," moderate non-positional obstructive sleep apnea, subjective upper extremity weakness, slight ataxia, imbalance, decreased sensation to bilateral lower extremity, hyperreflexia as well as chemo radiation for endometrial cancer and recent radiation to her left breast.   PAIN:  Are you having pain? No  PRECAUTIONS: None  WEIGHT BEARING RESTRICTIONS: No  FALLS: Has patient fallen in last 6 months? Yes. Number of falls 1 fall   LIVING ENVIRONMENT:  Lives with:  Lives with DTR who drops in and drops in once in a week Lives in: House/apartment Stairs: Yes: Internal: 15 steps; on left going up  and External: 5 steps; can reach both Has following equipment at home: Single point cane  PLOF: Independent  PATIENT GOALS: walking straight ( without veering), feel more comfortable walking out and about in the park, improve walking speed.   OBJECTIVE:   TODAY'S TREATMENT:                                                                                                                              DATE:04/02/2022  NMR:  1 foot on 6 in step, on floor with head turns x 10 slow head turns ea LE x 2 sets,   Step tap alt LE without UE support x 15 (more unsteady with standing on right LE)    Airex stance with adducted stance x 45 seconds -Added basketball toss x 25 tosses for added perturbation, patient reports was not too difficult.  Tandem standing x 20 sec x 2 each LE  TE Precore Leg press X 12 reps at 55 #  2 x 10 reps at 70#   Walked patient to well zone introduced her to staff and patient signs up for Silver sneakers program as she is eligible.  May use as well as out in future sessions to teach patient independent exercise program on equipment she will be using.     PATIENT EDUCATION: Allow for adequate rest breaks when motor fatigue intereferes with quality work of balance training.   HOME EXERCISE PROGRAM: Access Code: [email or web address] URL: [email or web address] Date: 04/02/2022 Prepared by: Sande Brothers  Exercises - Tandem Stance  - 3 x weekly - 3 sets - 20-30 sec hold - Single Leg Stance  - 3 x weekly - 3-4 sets - up to 20 sec hold    Access Code: [email or web address]  URL: [email or web address]  Date: 03/27/2022  Prepared by: [place]   Exercises -  Sit to  Stand - 1 x daily - 4 x weekly - 3 sets - 10 reps -  Standing Hip Abduction with Counter Support - 1 x daily - 4 x weekly - 3 sets - 15 reps -  Seated Heel Raise - 1 x daily - 4 x weekly - 3 sets - 15 reps -  Heel rises with counter support - 1 x daily - 4 x weekly - 3 sets - 15 reps -   Standing March with Counter Support - 1 x daily - 4 x weekly - 3 sets - 15 reps   GOALS: Goals reviewed with patient? Yes  SHORT TERM GOALS: Target date: 04/22/2022       Patient will be independent in home exercise program to improve strength/mobility for better functional independence with ADLs. Baseline: No HEP currently  Goal status: INITIAL   LONG TERM GOALS: Target date: 06/17/2022      1.  Patient will increase FOTO score to equal to or greater than  55   to demonstrate statistically significant improvement in mobility and quality of life.  Baseline: 52 Goal status: INITIAL   2.  Patient will increase Mini Best Balance score by > 4 points to demonstrate decreased fall risk during functional activities. Baseline: 19 Goal status: INITIAL   3.   Patient will reduce timed up and go to <11 seconds to reduce fall risk and demonstrate improved transfer/gait ability. Baseline: 14.6 sec Goal status: INITIAL  4.   Patient will increase 10 meter walk test to >1.17ms as to improve gait speed for better community ambulation and to reduce fall risk. Baseline: .89 m/s Goal status: INITIAL  5.   Patient will increase six minute walk test distance to >1630 feet and with no signs of imbalance for progression to super community ambulator. Baseline: 03/27/22: 14617f no device, intermitent drifting/sway/crossover Goal status: REVISED     ASSESSMENT:  CLINICAL IMPRESSION: Patient presents with good motivation for completion of physical therapy activities.  Patient not having dizziness at this time but did have feelings of alteration in her perception since her last session resulting in trip to the ED with no significant findings.  Patient continues with primarily static balance as well as lower extremity strengthening.Pt will continue to benefit from skilled physical therapy intervention to address impairments, improve QOL, and attain therapy goals.    OBJECTIVE IMPAIRMENTS:  Abnormal gait, decreased activity tolerance, decreased balance, decreased endurance, decreased mobility, difficulty walking, decreased ROM, decreased strength, and impaired perceived functional ability.   ACTIVITY LIMITATIONS: standing, squatting, stairs, and locomotion level  PARTICIPATION LIMITATIONS: cleaning, shopping, community activity, and yard work  PERSONAL FACTORS: Time since onset of injury/illness/exacerbation and 3+ comorbidities: ischemic stroke, cancer in remission, arthritis,   are also affecting patient's functional outcome.   REHAB POTENTIAL: Good  CLINICAL DECISION MAKING: Stable/uncomplicated  EVALUATION COMPLEXITY: Low  PLAN:  PT FREQUENCY: 1-2x/week  PT DURATION: 12 weeks  PLANNED INTERVENTIONS: Therapeutic exercises, Therapeutic activity, Neuromuscular re-education, Balance training, Gait training, Patient/Family education, Self Care, Joint mobilization, and Stair training  PLAN FOR NEXT SESSION: Continue to monitor vitals as appropriate, advance HEP with balance exercises, gait based balance, introduced to activities she can perform in the well zone prior to discharge  ChParticia LatherPT 04/14/2022, 4:06 PM  4:06 PM, 04/14/22 Physical Therapist - [phone]

## 2022-04-14 NOTE — Progress Notes (Unsigned)
Name: Natalie [name]   MRN: [id number]    DOB: Sep 08, 1950   Date:04/15/2022       Progress Note  Subjective  Chief Complaint  ER Follow-Up  HPI  Visual disturbance: 04/10/2022 she was at [facility], she was looking at a lamp and all the sudden change in her vision - she states the lamp looked different for seconds , it resolved and it happened again. She went to [facility] , CT showed a change but possible artifact and MRI was normal - done about 5 hours after the onset of symptoms. She was discharged home  She states it was similar to previous episode of CVA Nov 2023 but not as intense , she has a history of migraines but states no headaches with the episode of visual change She is compliant with her medications    Patient Active Problem List   Diagnosis Date Noted   Cerebrovascular accident (CVA) ([place]) 12/05/2021   Acute CVA (cerebrovascular accident) ([place]) 12/04/2021   Trigger finger, right ring finger 09/10/2021   Dupuytren's contracture of both hands 09/10/2021   Vitamin D deficiency 09/10/2021   Malignant neoplasm of left breast in female, estrogen receptor positive ([place]) 09/01/2021   Dyslipidemia 05/16/2021   Major depression, recurrent, chronic ([place]) 03/13/2021   Mild protein-calorie malnutrition ([place]) 03/13/2021   Endometrial cancer ([place]) 12/26/2020   Postoperative anemia due to acute blood loss 12/19/2020   History of CVA in adulthood 04/19/2020   History of ischemic multifocal posterior circulation stroke 02/29/2020   Difficulty hearing 06/28/2015   Arthritis, degenerative 06/28/2015   Paresthesia 06/28/2015   Purpura, nonthrombopenic ([place]) 06/28/2015   Adult hypothyroidism 08/09/2014   Migraine without aura and without status migrainosus, not intractable 09/19/2008   Moderate major depression ([place]) 09/07/2006   OP (osteoporosis) 09/07/2006    Past Surgical History:  Procedure Laterality Date   ABDOMINAL HYSTERECTOMY  12/18/2020   ABLATION  2006   AXILLARY SENTINEL NODE BIOPSY  Left 09/25/2021   Procedure: AXILLARY SENTINEL NODE BIOPSY;  Surgeon: Robert Bellow, MD;  Location: [facility] ORS;  Service: General;  Laterality: Left;   BREAST BIOPSY Left 08/21/2021   Stereo Bx, X-clip; path --> IMC (G1, ER/PR +, Her2/neu -)   BREAST LUMPECTOMY WITH NEEDLE LOCALIZATION Left 09/25/2021   Procedure: BREAST LUMPECTOMY WITH NEEDLE LOCALIZATION;  Surgeon: Robert Bellow, MD;  Location: [facility] OR[facility];  Service: General;  Laterality: Left;   BUBBLE STUDY  04/23/2020   Procedure: BUBBLE STUDY;  Surgeon: Larey Dresser, MD;  Location: [place];  Service: Cardiovascular;;   CATARACT EXTRACTION W/ INTRAOCULAR LENS IMPLANT Bilateral    COLONOSCOPY     COLONOSCOPY WITH PROPOFOL N/A 12/22/2018   Procedure: COLONOSCOPY WITH PROPOFOL;  Surgeon: Virgel Manifold, MD;  Location: [facility] ENDOSCOPY;  Service: Endoscopy;  Laterality: N/A;   EYE SURGERY  Spring 2018   Cataracts both eyes   HYSTEROSCOPY WITH D & C N/A 11/01/2020   Procedure: DILATATION AND CURETTAGE /HYSTEROSCOPY WITH MYOSURE;  Surgeon: Lafonda Mosses, MD;  [facility];  Service: Gynecology;  Laterality: N/A;   LAPAROTOMY  12/18/2020   Procedure: LAPAROTOMY;  Surgeon: Lafonda Mosses, MD;  [facility]: [facility];  Service: Gynecology;;   LOOP RECORDER INSERTION N/A 04/23/2020   Procedure: LOOP RECORDER INSERTION;  Surgeon: Evans Lance, MD;  Location: [place] CV LAB;  Service: Cardiovascular;  Laterality: N/A;   ROBOTIC ASSISTED TOTAL HYSTERECTOMY WITH BILATERAL SALPINGO OOPHERECTOMY Bilateral 12/18/2020   Procedure: XI ROBOTIC ASSISTED TOTAL HYSTERECTOMY WITH  BILATERAL SALPINGO OOPHORECTOMY;  Surgeon: Lafonda Mosses, MD;  [facility];  Service: Gynecology;  Laterality: Bilateral;   SENTINEL NODE BIOPSY N/A 12/18/2020   Procedure: SENTINEL NODE BIOPSY;  Surgeon: Lafonda Mosses, MD;  [facility];  Service: Gynecology;  Laterality: N/A;   TEE WITHOUT CARDIOVERSION N/A 04/23/2020   Procedure:  TRANSESOPHAGEAL ECHOCARDIOGRAM (TEE);  Surgeon: Larey Dresser, MD;  Location: [facility] ENDOSCOPY;  Service: Cardiovascular;  Laterality: N/A;   TONSILLECTOMY      Family History  Problem Relation Age of Onset   Early death Father        suicide   Depression Father    Diabetes Father    Cancer Father        prostate   Hearing loss Father        due to war   Alzheimer's disease Mother    Heart disease Brother    Atrial fibrillation Brother    Heart disease Maternal Aunt    Dementia Maternal Aunt    Heart disease Maternal Uncle    Dementia Maternal Grandmother    Heart disease Brother    Atrial fibrillation Brother    Colon cancer Neg Hx    Breast cancer Neg Hx    Ovarian cancer Neg Hx    Pancreatic cancer Neg Hx    Endometrial cancer Neg Hx     Social History   Tobacco Use   Smoking status: Former    Packs/day: 0.50    Years: 6.00    Total pack years: 3.00    Types: Cigarettes    Quit date: 02/11/1974    Years since quitting: 48.2   Smokeless tobacco: Never  Substance Use Topics   Alcohol use: Not Currently     Current Outpatient Medications:    alendronate (FOSAMAX) 70 MG tablet, TAKE 1 TABLET(70 MG) BY MOUTH 1 TIME A WEEK WITH A FULL GLASS OF WATER AND ON AN EMPTY STOMACH, Disp: 4 tablet, Rfl: 5   aspirin 81 MG EC tablet, Take by mouth., Disp: , Rfl:    aspirin-acetaminophen-caffeine (EXCEDRIN MIGRAINE) 250-250-65 MG tablet, Take by mouth every 6 (six) hours as needed for headache or migraine., Disp: , Rfl:    atorvastatin (LIPITOR) 40 MG tablet, Take 1 tablet (40 mg total) by mouth daily., Disp: 90 tablet, Rfl: 1   buPROPion (WELLBUTRIN SR) 150 MG 12 hr tablet, Take 1 tablet (150 mg total) by mouth 2 (two) times daily., Disp: 180 tablet, Rfl: 1   citalopram (CELEXA) 40 MG tablet, TAKE 1 TABLET BY MOUTH EVERY DAY, Disp: 90 tablet, Rfl: 1   clopidogrel (PLAVIX) 75 MG tablet, Take 1 tablet (75 mg total) by mouth daily., Disp: 1 tablet, Rfl: 0   Cyanocobalamin (VITAMIN  B-12) 1000 MCG SUBL, Place 1 tablet (1,000 mcg total) under the tongue 2 (two) times a week., Disp: 30 tablet, Rfl: 0   levothyroxine (SYNTHROID) 75 MCG tablet, TAKE 1 TABLET(75 MCG) BY MOUTH DAILY BEFORE BREAKFAST, Disp: 90 tablet, Rfl: 0   Multiple Vitamin (MULTIVITAMIN WITH MINERALS) TABS tablet, Take 1 tablet by mouth daily., Disp: , Rfl:    polyvinyl alcohol (LIQUIFILM TEARS) 1.4 % ophthalmic solution, Place 1 drop into both eyes daily as needed for dry eyes., Disp: , Rfl:    Ubrogepant (UBRELVY) 100 MG TABS, Take 100 mg by mouth daily as needed (migraines)., Disp: , Rfl:    Vitamin D, Ergocalciferol, (DRISDOL) 1.25 MG (50000 UNIT) CAPS capsule, Take 1 capsule (50,000 Units total) by  mouth every 14 (fourteen) days., Disp: 6 capsule, Rfl: 1  Allergies  Allergen Reactions   Sulfamethoxazole-Trimethoprim Hives   Tnkase [Tenecteplase] Swelling    Mild focal left upper lip swelling    I personally reviewed active problem list, medication list, allergies, family history, social history, health maintenance with the patient/caregiver today.   ROS  Ten systems reviewed and is negative except as mentioned in HPI   Objective  Vitals:   04/15/22 1411  BP: 126/78  Pulse: 72  Resp: 14  Temp: 97.8 F (36.6 C)  TempSrc: Oral  SpO2: 98%  Weight: 153 lb 3.2 oz (69.5 kg)  Height: '5\' 5"'$  (1.651 m)    Body mass index is 25.49 kg/m.  Physical Exam  Constitutional: Patient appears well-developed and well-nourished.  No distress.  HEENT: head atraumatic, normocephalic, pupils equal and reactive to light, neck supple Cardiovascular: Normal rate, regular rhythm and normal heart sounds.  No murmur heard. No BLE edema. Pulmonary/Chest: Effort normal and breath sounds normal. No respiratory distress. Abdominal: Soft.  There is no tenderness. Psychiatric: Patient has a normal mood and affect. behavior is normal. Judgment and thought content normal.  Neuro: no focal deficit   PHQ2/9:     04/15/2022    2:13 PM 04/03/2022    2:51 PM 03/14/2022    9:38 AM 12/11/2021   11:27 AM 09/10/2021   10:56 AM  Depression screen PHQ 2/9  Decreased Interest 0 0 0 0 0  Down, Depressed, Hopeless '1 1 1 '$ 0 0  PHQ - 2 Score '1 1 1 '$ 0 0  Altered sleeping 1 0 0 0 0  Tired, decreased energy '1 1 1 '$ 0 0  Change in appetite '1 1 1 '$ 0 0  Feeling bad or failure about yourself  0 0 0 0 0  Trouble concentrating 0 0 0 0 0  Moving slowly or fidgety/restless 0 0 0 0 0  Suicidal thoughts 0 0 0 0 0  PHQ-9 Score '4 3 3 '$ 0 0  Difficult doing work/chores Not difficult at all Somewhat difficult Not difficult at all      phq 9 is positive   Fall Risk:    04/15/2022    2:13 PM 04/03/2022    2:47 PM 03/14/2022    9:38 AM 12/11/2021   11:26 AM 09/10/2021   10:56 AM  Fall Risk   Falls in the past year? 0 0 0 0 0  Number falls in past yr:  0  0 0  Injury with Fall?  0  0 0  Risk for fall due to : No Fall Risks No Fall Risks No Fall Risks No Fall Risks No Fall Risks  Follow up Falls prevention discussed Education provided;Falls prevention discussed Falls prevention discussed Falls prevention discussed Falls prevention discussed     Assessment & Plan  1. Episode of visual disturbance  Keep follow ups with neurologist, no other episodes since [facility] visit   Reviewed [facility] records

## 2022-04-15 ENCOUNTER — Encounter: Payer: Self-pay | Admitting: Family Medicine

## 2022-04-15 ENCOUNTER — Ambulatory Visit (INDEPENDENT_AMBULATORY_CARE_PROVIDER_SITE_OTHER): Payer: Medicare Other | Admitting: Family Medicine

## 2022-04-15 VITALS — BP 126/78 | HR 72 | Temp 97.8°F | Resp 14 | Ht 65.0 in | Wt 153.2 lb

## 2022-04-15 DIAGNOSIS — H539 Unspecified visual disturbance: Secondary | ICD-10-CM | POA: Diagnosis not present

## 2022-04-15 NOTE — Therapy (Signed)
OUTPATIENT SPEECH LANGUAGE PATHOLOGY TREATMENT NOTE   Patient Name: Natalie [name] MRN: [id number] DOB:1950-06-26, 72 y.o., female 04/15/2022    PCP: [name], MD REFERRING PROVIDER: Jennings Books, MD  END OF SESSION:   End of Session - 04/15/22 0807     Visit Number 8    Number of Visits 25    Date for SLP Re-Evaluation 06/01/22    Authorization Type Medicare A/Medicare B    Progress Note Due on Visit 10    SLP Start Time 1500    SLP Stop Time  1600    SLP Time Calculation (min) 60 min    Activity Tolerance Patient tolerated treatment well;No increased pain              Past Medical History:  Diagnosis Date   Acute bronchospasm due to viral infection 2020   Due to pollen   Acute ischemic left MCA stroke ([place]) 04/19/2020   a.) CTA head/neck 04/19/2020 --> nearly occlusive thrombus at bifurcation of a proximal M2 MCA branch approximately 6 mm from the MCA bifurcation. Subsequent occlusion of a proximal left M3 MCA branch   Allergy    Anemia    Angina at rest    Breast cancer, left ([place]) 08/21/2021   a.) stereotactic Bx 08/21/2021 --> IMC (G1, ER/PR +, Her2/neu -)   Cataract    a.) s/p extraction with IOL placement   Depressive disorder    Diverticulosis    Dyspnea    Epistaxis    Fatigue    Hearing loss    History of 2019 novel coronavirus disease (COVID-19) 12/18/2020   History of ischemic multifocal posterior circulation stroke 02/28/2020   a.) presented to ED with increasing episodes of  transient vertical binocular diplopia; MRI 02/27/2021 --> multiple punctate acute to subacute ischemic infarcts involving the cortical/subcortical aspects of both parieto-occipital regions   History of loop recorder    History of radiation therapy    Vagina-02/11/21-03/14/21- Dr. Gery Pray   HLD (hyperlipidemia)    Hypothyroidism    LAFB (left anterior fascicular block)    Long term current use of antithrombotics/antiplatelets    a.) on DAPT therapy (ASA +  clopidogrel)   Memory change    Migraine    Osteoarthritis    Osteoporosis    Palpitations    Premature menopause    QT prolongation    Seizures ([place]) 1994   history of mini seizures, possible migraine induced   Stage I adenocarcinoma of endometrium ([place]) 12/18/2020   a.) stage 1A; grade 1 (pT1a, pN0); b.) s/p TLH/BSO 12/18/2020 + adjuvant vaginal brachytherapy (30 Gy over 5 fractions between 02/11/2021 - 03/14/2021)   Vitamin D deficiency    Past Surgical History:  Procedure Laterality Date   ABDOMINAL HYSTERECTOMY  12/18/2020   ABLATION  2006   AXILLARY SENTINEL NODE BIOPSY Left 09/25/2021   Procedure: AXILLARY SENTINEL NODE BIOPSY;  Surgeon: Robert Bellow, MD;  Location: [facility];  Service: General;  Laterality: Left;   BREAST BIOPSY Left 08/21/2021   Stereo Bx, X-clip; path --> IMC (G1, ER/PR +, Her2/neu -)   BREAST LUMPECTOMY WITH NEEDLE LOCALIZATION Left 09/25/2021   Procedure: BREAST LUMPECTOMY WITH NEEDLE LOCALIZATION;  Surgeon: Robert Bellow, MD;  Location: [facility];  Service: General;  Laterality: Left;   BUBBLE STUDY  04/23/2020   Procedure: BUBBLE STUDY;  Surgeon: Larey Dresser, MD;  Location: [facility];  Service: Cardiovascular;;   CATARACT EXTRACTION W/ INTRAOCULAR LENS IMPLANT Bilateral  COLONOSCOPY     COLONOSCOPY WITH PROPOFOL N/A 12/22/2018   Procedure: COLONOSCOPY WITH PROPOFOL;  Surgeon: Virgel Manifold, MD;  Location: [facility] ENDOSCOPY;  Service: Endoscopy;  Laterality: N/A;   EYE SURGERY  Spring 2018   Cataracts both eyes   HYSTEROSCOPY WITH D & C N/A 11/01/2020   Procedure: DILATATION AND CURETTAGE /HYSTEROSCOPY WITH MYOSURE;  Surgeon: Lafonda Mosses, MD;  Location: [facility];  Service: Gynecology;  Laterality: N/A;   LAPAROTOMY  12/18/2020   Procedure: LAPAROTOMY;  Surgeon: Lafonda Mosses, MD;  Location: [facility];  Service: Gynecology;;   LOOP RECORDER INSERTION N/A 04/23/2020   Procedure: LOOP RECORDER INSERTION;  Surgeon: Evans Lance, MD;  Location: [facility];  Service: Cardiovascular;  Laterality: N/A;   ROBOTIC ASSISTED TOTAL HYSTERECTOMY WITH BILATERAL SALPINGO OOPHERECTOMY Bilateral 12/18/2020   Procedure: XI ROBOTIC ASSISTED TOTAL HYSTERECTOMY WITH BILATERAL SALPINGO OOPHORECTOMY;  Surgeon: Lafonda Mosses, MD;  Location: [facility];  Service: Gynecology;  Laterality: Bilateral;   SENTINEL NODE BIOPSY N/A 12/18/2020   Procedure: SENTINEL NODE BIOPSY;  Surgeon: Lafonda Mosses, MD;  Location: [facility];  Service: Gynecology;  Laterality: N/A;   TEE WITHOUT CARDIOVERSION N/A 04/23/2020   Procedure: TRANSESOPHAGEAL ECHOCARDIOGRAM (TEE);  Surgeon: Larey Dresser, MD;  Location: [facility];  Service: Cardiovascular;  Laterality: N/A;   TONSILLECTOMY     Patient Active Problem List   Diagnosis Date Noted   Cerebrovascular accident (CVA) ([place]) 12/05/2021   Acute CVA (cerebrovascular accident) ([place]) 12/04/2021   Trigger finger, right ring finger 09/10/2021   Dupuytren's contracture of both hands 09/10/2021   Vitamin D deficiency 09/10/2021   Malignant neoplasm of left breast in female, estrogen receptor positive ([place]) 09/01/2021   Dyslipidemia 05/16/2021   Major depression, recurrent, chronic ([place]) 03/13/2021   Mild protein-calorie malnutrition ([place]) 03/13/2021   Endometrial cancer ([place]) 12/26/2020   Postoperative anemia due to acute blood loss 12/19/2020   History of CVA in adulthood 04/19/2020   History of ischemic multifocal posterior circulation stroke 02/29/2020   Difficulty hearing 06/28/2015   Arthritis, degenerative 06/28/2015   Paresthesia 06/28/2015   Purpura, nonthrombopenic ([place]) 06/28/2015   Adult hypothyroidism 08/09/2014   Migraine without aura and without status migrainosus, not intractable 09/19/2008   Moderate major depression ([place]) 09/07/2006   OP (osteoporosis) 09/07/2006    ONSET DATE: date of referral 03/11/2022    REFERRING DIAG: Z86.73 (ICD-10-CM) - Personal history  of transient ischemic attack (TIA), and cerebral infarction without residual deficits     PERTINENT HISTORY:  Pt is a 72 year old female with history of multiple strokes, migraine, "Subjective versus Mild Cognitive Impairment (word finding difficulty, short-term recall), in patient with positive family history of Alzheimer's Disease, elevated p-tau, unremarkable SNFL," moderate non-positional obstructive sleep apnea, subjective upper extremity weakness, slight ataxia, imbalance, decreased sensatio to bilateral lower extremity, hyperreflexia as well as chemo radiation for endometrial cancer and recent radiation to her left breast. .    DIAGNOSTIC FINDINGS:  12/05/2021 MRI   No acute infarction, hemorrhage, hydrocephalus, extra-axial collection or mass lesion. Small amount of scattered foci of T2 hyperintensity are seen within the white matter of the cerebral hemispheres and within the pons, nonspecific, most likely related to chronic small vessel ischemia. Remote cortical infarcts the left parietal and occipital lobes. Tiny remote infarct in the left thalamus.   07/17/2020 MRI There is no evidence of an acute infarct, mass, midline shift, or extra-axial fluid collection. Scattered small T2 hyperintensities in the  cerebral white matter bilaterally are nonspecific but compatible with mild chronic small vessel ischemic disease. There is mild encephalomalacia corresponding to the small posterior left MCA territory infarcts which were acute on the prior MRI. Chronic microhemorrhages are again noted posteriorly in both cerebral hemispheres. A chronic lacunar infarct in the left thalamus is unchanged from the prior MRI.   04/19/2020 MRI Generalized age appropriate cerebral volume. Mild chronic microvascular ischemic disease again noted.   Patchy small volume foci of restricted diffusion seen involving the cortical and subcortical aspect of the posterior left frontoparietal and temporal  region, consistent with posterior left MCA distribution infarcts. For reference purposes, the largest of these foci seen at the posterior left temporal region and measures 8 mm. Findings are embolic in distribution. No associated hemorrhage or mass effect.   Additional apparent subcentimeter focus of mild diffusion abnormality at the posterior right frontoparietal centrum semi ovale favored to reflect T2 shine through (series 2, image 34). No other evidence for acute or subacute ischemia. Gray-white matter differentiation otherwise maintained. No acute intracranial hemorrhage. Small focus of susceptibility artifact at the posterior right temporal region noted, consistent with a small chronic microhemorrhage. Additional minimal residual chronic blood products noted about the occipital regions related to recently identified ischemic infarcts.   02/28/2020 MRI Volume cerebral volume within normal limits for age. Minimal scattered patchy T2/FLAIR hyperintensity within the periventricular white matter and pons, most like related chronic microvascular ischemic disease, mild for age.   There are a few scattered punctate foci of diffusion abnormality seen involving the cortical/subcortical aspects of both parieto-occipital regions (series 5, images 29, 24, 20, 18), consistent with tiny acute to subacute ischemic infarcts. Associated minimal petechial hemorrhage (series 13, image 25). Subtle associated postcontrast enhancement consistent with subacute ischemia (series 32, images 113, 81). No malignant hemorrhagic transformation or significant regional mass effect. No evidence for subarachnoid hemorrhage within this region on prior head CT.   02/28/2020  Few scattered punctate acute to subacute ischemic infarcts involving the cortical/subcortical aspects of both parieto-occipital Regions consistent with tiny acute to subacute ischemic infarcts.  2. No other acute intracranial  abnormality. 3. Underlying mild chronic microvascular ischemic disease.  THERAPY DIAG:  Cognitive communication deficit  Rationale for Evaluation and Treatment Rehabilitation  SUBJECTIVE: Pt states she is feeling better after having stroke like symptoms during last weeks session.   Pt accompanied by: self  PAIN:  Are you having pain? No  PATIENT GOALS: to improve memory   OBJECTIVE:   TODAY'S TREATMENT: Skilled treatment session focused on pt's language goals, specifically memory. SLP facilitated session by providing the following interventions:     Pt reports a lack of organization in everyday life. With SLP assistance, pt able to sequence steps needed to create a plan to organize rooms in her house. Pt required moderate cueing to stay on task.               PATIENT EDUCATION: Education details: see above Person educated: Patient Education method: Explanation, Verbal cues, and Handouts Education comprehension: verbalized understanding  HOME EXERCISE PROGRAM: Continue using [place]; paragraph level readings found within Prayer Book    GOALS: Goals reviewed with patient? Yes  SHORT TERM GOALS: Target date: 10 sessions  Pt will demonstrate comprehension of verbal information related to health conditions with 75% accuracy and moderate cues.  Baseline: Goal status: INITIAL  2.  Pt will use external aids to manage appointments, chores, shopping in 4 out 6 opportunities  with moderate cues.  Baseline:  Goal status: INITIAL  3.  Pt will complete moderately complex reasoning tasks with 75% accuracy in a reasonable amount of time with double checking, given moderate cues.  Baseline:  Goal status: INITIAL   LONG TERM GOALS: Target date: 06/01/2022  Pt will use strategies to improve memory for important information with 75% acc. with Mod I (ie., white board, daily planner/calendar, Apps on phone).  Baseline:  Goal status: INITIAL  2.    With Mod I, Patient will demonstrate anticipatory awareness by identifying cognitive-communication barriers and using appropriate compensatory strategies in 75% of opportunities.  Baseline:  Goal status: INITIAL  3.  Pt will complete moderately complex reasoning tasks with > 90% accuracy in a reasonable amount of time with double checking, with use of strategies.  Baseline:  Goal status: INITIAL   ASSESSMENT:  CLINICAL IMPRESSION: Pt reported feeling better after having stroke like symptoms during previous sessions. Pt reports use of various memory strategies discussed during previous sessions ([place], calendar, reminders) at varying rates of success. While pt memory function remains consistent, need for SLP services is declining due to lack of pt carryover and commitment to use.   OBJECTIVE IMPAIRMENTS  include attention, memory, and executive functioning. These impairments are limiting patient from managing medications, managing appointments, managing finances, household responsibilities, ADLs/IADLs, and effectively communicating at home and in community. Factors affecting potential to achieve goals and functional outcome are  needs further assessment .Marland Kitchen Patient will benefit from skilled SLP services to address above impairments and improve overall function.   REHAB POTENTIAL: Good  PLAN: SLP FREQUENCY: 1-2x/week  SLP DURATION: 12 weeks  PLANNED INTERVENTIONS: Cueing hierachy, Cognitive reorganization, Internal/external aids, Functional tasks, SLP instruction and feedback, Compensatory strategies, and Patient/family education  Alphonzo Grieve, SLP Graduate Clinician    Happi B. Rutherford Nail, M.S., CCC-SLP, Mining engineer Certified Brain Injury [place]  [place] Office [phone] Ascom [phone] Fax [phone]

## 2022-04-17 ENCOUNTER — Ambulatory Visit: Payer: Medicare Other | Admitting: Physical Therapy

## 2022-04-17 ENCOUNTER — Ambulatory Visit: Payer: Medicare Other | Admitting: Speech Pathology

## 2022-04-17 NOTE — Therapy (Deleted)
OUTPATIENT PHYSICAL THERAPY NEURO TREATMENT   Patient Name: Natalie [name] MRN: [id number] DOB:[birth year], 72 y.o., female Today's Date: 04/17/2022   PCP: [name], MD REFERRING PROVIDER: Vladimir Crofts, MD   END OF SESSION:      Past Medical History:  Diagnosis Date   Acute bronchospasm due to viral infection 2020   Due to pollen   Acute ischemic left MCA stroke ([place]) 04/19/2020   a.) CTA head/neck 04/19/2020 --> nearly occlusive thrombus at bifurcation of a proximal M2 MCA branch approximately 6 mm from the MCA bifurcation. Subsequent occlusion of a proximal left M3 MCA branch   Allergy    Anemia    Angina at rest    Breast cancer, left ([place]) 08/21/2021   a.) stereotactic Bx 08/21/2021 --> IMC (G1, ER/PR +, Her2/neu -)   Cataract    a.) s/p extraction with IOL placement   Depressive disorder    Diverticulosis    Dyspnea    Epistaxis    Fatigue    Hearing loss    History of 2019 novel coronavirus disease (COVID-19) 12/18/2020   History of ischemic multifocal posterior circulation stroke 02/28/2020   a.) presented to ED with increasing episodes of  transient vertical binocular diplopia; MRI 02/27/2021 --> multiple punctate acute to subacute ischemic infarcts involving the cortical/subcortical aspects of both parieto-occipital regions   History of loop recorder    History of radiation therapy    Vagina-02/11/21-03/14/21- Dr. Gery Pray   HLD (hyperlipidemia)    Hypothyroidism    LAFB (left anterior fascicular block)    Long term current use of antithrombotics/antiplatelets    a.) on DAPT therapy (ASA + clopidogrel)   Memory change    Migraine    Osteoarthritis    Osteoporosis    Palpitations    Premature menopause    QT prolongation    Seizures ([place]) 1994   history of mini seizures, possible migraine induced   Stage I adenocarcinoma of endometrium ([place]) 12/18/2020   a.) stage 1A; grade 1 (pT1a, pN0); b.) s/p TLH/BSO 12/18/2020 + adjuvant vaginal  brachytherapy (30 Gy over 5 fractions between 02/11/2021 - 03/14/2021)   Vitamin D deficiency    Past Surgical History:  Procedure Laterality Date   ABDOMINAL HYSTERECTOMY  12/18/2020   ABLATION  2006   AXILLARY SENTINEL NODE BIOPSY Left 09/25/2021   Procedure: AXILLARY SENTINEL NODE BIOPSY;  Surgeon: Robert Bellow, MD;  Location: [facility];  Service: General;  Laterality: Left;   BREAST BIOPSY Left 08/21/2021   Stereo Bx, X-clip; path --> IMC (G1, ER/PR +, Her2/neu -)   BREAST LUMPECTOMY WITH NEEDLE LOCALIZATION Left 09/25/2021   Procedure: BREAST LUMPECTOMY WITH NEEDLE LOCALIZATION;  Surgeon: Robert Bellow, MD;  Location: [facility];  Service: General;  Laterality: Left;   BUBBLE STUDY  04/23/2020   Procedure: BUBBLE STUDY;  Surgeon: Larey Dresser, MD;  Location: [place] [facility];  Service: Cardiovascular;;   CATARACT EXTRACTION W/ INTRAOCULAR LENS IMPLANT Bilateral    COLONOSCOPY     COLONOSCOPY WITH PROPOFOL N/A 12/22/2018   Procedure: COLONOSCOPY WITH PROPOFOL;  Surgeon: Virgel Manifold, MD;  Location: [facility];  Service: Endoscopy;  Laterality: N/A;   EYE SURGERY  Spring 2018   Cataracts both eyes   HYSTEROSCOPY WITH D & C N/A 11/01/2020   Procedure: DILATATION AND CURETTAGE /HYSTEROSCOPY WITH MYOSURE;  Surgeon: Lafonda Mosses, MD;  Location: [facility];  Service: Gynecology;  Laterality: N/A;   LAPAROTOMY  12/18/2020   Procedure: LAPAROTOMY;  Surgeon: Lafonda Mosses, MD;  Location: [facility];  Service: Gynecology;;   LOOP RECORDER INSERTION N/A 04/23/2020   Procedure: LOOP RECORDER INSERTION;  Surgeon: Evans Lance, MD;  Location: [facility];  Service: Cardiovascular;  Laterality: N/A;   ROBOTIC ASSISTED TOTAL HYSTERECTOMY WITH BILATERAL SALPINGO OOPHERECTOMY Bilateral 12/18/2020   Procedure: XI ROBOTIC ASSISTED TOTAL HYSTERECTOMY WITH BILATERAL SALPINGO OOPHORECTOMY;  Surgeon: Lafonda Mosses, MD;  Location: [facility];  Service: Gynecology;   Laterality: Bilateral;   SENTINEL NODE BIOPSY N/A 12/18/2020   Procedure: SENTINEL NODE BIOPSY;  Surgeon: Lafonda Mosses, MD;  Location: [facility];  Service: Gynecology;  Laterality: N/A;   TEE WITHOUT CARDIOVERSION N/A 04/23/2020   Procedure: TRANSESOPHAGEAL ECHOCARDIOGRAM (TEE);  Surgeon: Larey Dresser, MD;  Location: [facility];  Service: Cardiovascular;  Laterality: N/A;   TONSILLECTOMY     Patient Active Problem List   Diagnosis Date Noted   Cerebrovascular accident (CVA) ([place]) 12/05/2021   Acute CVA (cerebrovascular accident) ([place]) 12/04/2021   Trigger finger, right ring finger 09/10/2021   Dupuytren's contracture of both hands 09/10/2021   Vitamin D deficiency 09/10/2021   Malignant neoplasm of left breast in female, estrogen receptor positive ([place]) 09/01/2021   Dyslipidemia 05/16/2021   Major depression, recurrent, chronic ([place]) 03/13/2021   Mild protein-calorie malnutrition ([place]) 03/13/2021   Endometrial cancer ([place]) 12/26/2020   Postoperative anemia due to acute blood loss 12/19/2020   History of CVA in adulthood 04/19/2020   History of ischemic multifocal posterior circulation stroke 02/29/2020   Difficulty hearing 06/28/2015   Arthritis, degenerative 06/28/2015   Paresthesia 06/28/2015   Purpura, nonthrombopenic ([place]) 06/28/2015   Adult hypothyroidism 08/09/2014   Migraine without aura and without status migrainosus, not intractable 09/19/2008   Moderate major depression ([place]) 09/07/2006   OP (osteoporosis) 09/07/2006    ONSET DATE: 04/19/20  REFERRING DIAG: [id number] (ICD-10-CM) - Personal history of transient ischemic attack (TIA), and cerebral infarction without residual deficits   THERAPY DIAG:  No diagnosis found.  Rationale for Evaluation and Treatment: Rehabilitation  SUBJECTIVE:                                                                                                                                                                                              SUBJECTIVE STATEMENT: Patient reports she had to go to the emergency department last week secondary to an alteration in her perception.  Patient reports no significant findings as a result and went home with improved feelings of this but still feels like it continue to be an issue in the future.   PERTINENT HISTORY: Pt is a  72 year old female with history of multiple strokes, migraine, "Subjective versus Mild Cognitive Impairment (word finding difficulty, short-term recall), in patient with positive family history of Alzheimer's Disease, elevated p-tau, unremarkable SNFL," moderate non-positional obstructive sleep apnea, subjective upper extremity weakness, slight ataxia, imbalance, decreased sensation to bilateral lower extremity, hyperreflexia as well as chemo radiation for endometrial cancer and recent radiation to her left breast.   PAIN:  Are you having pain? No  PRECAUTIONS: None  WEIGHT BEARING RESTRICTIONS: No  FALLS: Has patient fallen in last 6 months? Yes. Number of falls 1 fall   LIVING ENVIRONMENT:  Lives with:  Lives with DTR who drops in and drops in once in a week Lives in: House/apartment Stairs: Yes: Internal: 15 steps; on left going up and External: 5 steps; can reach both Has following equipment at home: Single point cane  PLOF: Independent  PATIENT GOALS: walking straight ( without veering), feel more comfortable walking out and about in the park, improve walking speed.   OBJECTIVE:   TODAY'S TREATMENT:                                                                                                                                NMR:  1 foot on 6 in step, on floor with head turns x 10 slow head turns ea LE x 2 sets,   Step tap alt LE without UE support x 15 (more unsteady with standing on right LE)    Airex stance with adducted stance x 45 seconds -Added basketball toss x 25 tosses for added perturbation, patient reports was not too  difficult.  Tandem standing x 20 sec x 2 each LE  TE Precore Leg press X 12 reps at 55 #  2 x 10 reps at 70#   Walked patient to well zone introduced her to staff and patient signs up for Silver sneakers program as she is eligible.  May use as well as out in future sessions to teach patient independent exercise program on equipment she will be using.     PATIENT EDUCATION: Allow for adequate rest breaks when motor fatigue intereferes with quality work of balance training.   HOME EXERCISE PROGRAM: Access Code: [email or web address] URL: [email or web address] Date: 04/02/2022 Prepared by: Sande Brothers  Exercises - Tandem Stance  - 3 x weekly - 3 sets - 20-30 sec hold - Single Leg Stance  - 3 x weekly - 3-4 sets - up to 20 sec hold    Access Code: [email or web address]  URL: [email or web address]  Date: 03/27/2022  Prepared by: [place]   Exercises -  Sit to Stand - 1 x daily - 4 x weekly - 3 sets - 10 reps -  Standing Hip Abduction with Counter Support - 1 x daily - 4 x weekly - 3 sets - 15 reps -  Seated Heel Raise - 1 x daily - 4 x weekly - 3 sets - 15 reps -  Heel rises with counter support - 1 x daily - 4 x weekly - 3 sets - 15 reps -  Standing March with Counter Support - 1 x daily - 4 x weekly - 3 sets - 15 reps   GOALS: Goals reviewed with patient? Yes  SHORT TERM GOALS: Target date: 04/22/2022       Patient will be independent in home exercise program to improve strength/mobility for better functional independence with ADLs. Baseline: No HEP currently  Goal status: INITIAL   LONG TERM GOALS: Target date: 06/17/2022      1.  Patient will increase FOTO score to equal to or greater than  55   to demonstrate statistically significant improvement in mobility and quality of life.  Baseline: 52 Goal status: INITIAL   2.  Patient will increase Mini Best Balance score by > 4 points to demonstrate decreased fall risk during functional  activities. Baseline: 19 Goal status: INITIAL   3.   Patient will reduce timed up and go to <11 seconds to reduce fall risk and demonstrate improved transfer/gait ability. Baseline: 14.6 sec Goal status: INITIAL  4.   Patient will increase 10 meter walk test to >1.60ms as to improve gait speed for better community ambulation and to reduce fall risk. Baseline: .89 m/s Goal status: INITIAL  5.   Patient will increase six minute walk test distance to >1630 feet and with no signs of imbalance for progression to super community ambulator. Baseline: 03/27/22: [id number] no device, intermitent drifting/sway/crossover Goal status: REVISED     ASSESSMENT:  CLINICAL IMPRESSION: Patient presents with good motivation for completion of physical therapy activities. Patient continues with primarily static balance as well as lower extremity strengthening.Pt will continue to benefit from skilled physical therapy intervention to address impairments, improve QOL, and attain therapy goals.    OBJECTIVE IMPAIRMENTS: Abnormal gait, decreased activity tolerance, decreased balance, decreased endurance, decreased mobility, difficulty walking, decreased ROM, decreased strength, and impaired perceived functional ability.   ACTIVITY LIMITATIONS: standing, squatting, stairs, and locomotion level  PARTICIPATION LIMITATIONS: cleaning, shopping, community activity, and yard work  PERSONAL FACTORS: Time since onset of injury/illness/exacerbation and 3+ comorbidities: ischemic stroke, cancer in remission, arthritis,   are also affecting patient's functional outcome.   REHAB POTENTIAL: Good  CLINICAL DECISION MAKING: Stable/uncomplicated  EVALUATION COMPLEXITY: Low  PLAN:  PT FREQUENCY: 1-2x/week  PT DURATION: 12 weeks  PLANNED INTERVENTIONS: Therapeutic exercises, Therapeutic activity, Neuromuscular re-education, Balance training, Gait training, Patient/Family education, Self Care, Joint mobilization, and  Stair training  PLAN FOR NEXT SESSION: Continue to monitor vitals as appropriate, advance HEP with balance exercises, gait based balance, introduced to activities she can perform in the well zone prior to discharge  ChParticia LatherPT 04/17/2022, 10:46 AM  10:46 AM, 04/17/22 Physical Therapist - [phone]

## 2022-04-18 ENCOUNTER — Telehealth: Payer: Self-pay | Admitting: *Deleted

## 2022-04-18 NOTE — Telephone Encounter (Signed)
     Patient  visit on 04/10/2022  at [facility] was for treatment   Have you been able to follow up with your primary care physician? yes The patient was or was not able to obtain any needed medicine or equipment.  Are there diet recommendations that you are having difficulty following?  Patient expresses understanding of discharge instructions and education provided has no other needs at this time.    Marland Kitchenj

## 2022-04-22 ENCOUNTER — Inpatient Hospital Stay: Payer: Medicare Other | Attending: Gynecologic Oncology | Admitting: Nurse Practitioner

## 2022-04-22 ENCOUNTER — Encounter: Payer: Self-pay | Admitting: Nurse Practitioner

## 2022-04-22 ENCOUNTER — Other Ambulatory Visit: Payer: Self-pay | Admitting: *Deleted

## 2022-04-22 ENCOUNTER — Ambulatory Visit: Payer: Medicare Other | Admitting: Speech Pathology

## 2022-04-22 VITALS — BP 133/77 | HR 62 | Temp 97.8°F | Resp 20 | Wt 146.9 lb

## 2022-04-22 DIAGNOSIS — Z8542 Personal history of malignant neoplasm of other parts of uterus: Secondary | ICD-10-CM | POA: Diagnosis not present

## 2022-04-22 DIAGNOSIS — Z8673 Personal history of transient ischemic attack (TIA), and cerebral infarction without residual deficits: Secondary | ICD-10-CM | POA: Insufficient documentation

## 2022-04-22 DIAGNOSIS — C50112 Malignant neoplasm of central portion of left female breast: Secondary | ICD-10-CM

## 2022-04-22 DIAGNOSIS — C50919 Malignant neoplasm of unspecified site of unspecified female breast: Secondary | ICD-10-CM

## 2022-04-22 DIAGNOSIS — Z90722 Acquired absence of ovaries, bilateral: Secondary | ICD-10-CM | POA: Diagnosis not present

## 2022-04-22 DIAGNOSIS — Z87891 Personal history of nicotine dependence: Secondary | ICD-10-CM | POA: Diagnosis not present

## 2022-04-22 DIAGNOSIS — M810 Age-related osteoporosis without current pathological fracture: Secondary | ICD-10-CM | POA: Diagnosis not present

## 2022-04-22 DIAGNOSIS — Z853 Personal history of malignant neoplasm of breast: Secondary | ICD-10-CM | POA: Diagnosis not present

## 2022-04-22 DIAGNOSIS — Z9079 Acquired absence of other genital organ(s): Secondary | ICD-10-CM | POA: Diagnosis not present

## 2022-04-22 DIAGNOSIS — C50212 Malignant neoplasm of upper-inner quadrant of left female breast: Secondary | ICD-10-CM | POA: Insufficient documentation

## 2022-04-22 DIAGNOSIS — Z08 Encounter for follow-up examination after completed treatment for malignant neoplasm: Secondary | ICD-10-CM | POA: Diagnosis not present

## 2022-04-22 DIAGNOSIS — R262 Difficulty in walking, not elsewhere classified: Secondary | ICD-10-CM | POA: Diagnosis not present

## 2022-04-22 DIAGNOSIS — R41841 Cognitive communication deficit: Secondary | ICD-10-CM

## 2022-04-22 DIAGNOSIS — Z90710 Acquired absence of both cervix and uterus: Secondary | ICD-10-CM | POA: Insufficient documentation

## 2022-04-22 DIAGNOSIS — I63412 Cerebral infarction due to embolism of left middle cerebral artery: Secondary | ICD-10-CM | POA: Diagnosis not present

## 2022-04-22 DIAGNOSIS — Z170 Estrogen receptor positive status [ER+]: Secondary | ICD-10-CM | POA: Diagnosis not present

## 2022-04-22 DIAGNOSIS — R2681 Unsteadiness on feet: Secondary | ICD-10-CM | POA: Diagnosis not present

## 2022-04-22 DIAGNOSIS — R2689 Other abnormalities of gait and mobility: Secondary | ICD-10-CM | POA: Diagnosis not present

## 2022-04-22 MED ORDER — LETROZOLE 2.5 MG PO TABS: 2.5000 mg | ORAL_TABLET | Freq: Every day | ORAL | 1 refills | Status: DC

## 2022-04-22 NOTE — Therapy (Signed)
OUTPATIENT SPEECH LANGUAGE PATHOLOGY TREATMENT NOTE   Patient Name: Natalie [name] MRN: [id number] DOB:01/25/1951, 72 y.o., female 04/22/2022    PCP: [name], MD REFERRING PROVIDER: Jennings Books, MD  END OF SESSION:   End of Session - 04/22/22 1256     Visit Number 9    Number of Visits 25    Date for SLP Re-Evaluation 06/01/22    Authorization Type Medicare A/Medicare B    Progress Note Due on Visit 10    SLP Start Time 1300    SLP Stop Time  1400    SLP Time Calculation (min) 60 min    Activity Tolerance Patient tolerated treatment well              Past Medical History:  Diagnosis Date   Acute bronchospasm due to viral infection 2020   Due to pollen   Acute ischemic left MCA stroke ([place]) 04/19/2020   a.) CTA head/neck 04/19/2020 --> nearly occlusive thrombus at bifurcation of a proximal M2 MCA branch approximately 6 mm from the MCA bifurcation. Subsequent occlusion of a proximal left M3 MCA branch   Allergy    Anemia    Angina at rest    Breast cancer, left ([place]) 08/21/2021   a.) stereotactic Bx 08/21/2021 --> IMC (G1, ER/PR +, Her2/neu -)   Cataract    a.) s/p extraction with IOL placement   Depressive disorder    Diverticulosis    Dyspnea    Epistaxis    Fatigue    Hearing loss    History of 2019 novel coronavirus disease (COVID-19) 12/18/2020   History of ischemic multifocal posterior circulation stroke 02/28/2020   a.) presented to ED with increasing episodes of  transient vertical binocular diplopia; MRI 02/27/2021 --> multiple punctate acute to subacute ischemic infarcts involving the cortical/subcortical aspects of both parieto-occipital regions   History of loop recorder    History of radiation therapy    Vagina-02/11/21-03/14/21- Dr. Gery Pray   HLD (hyperlipidemia)    Hypothyroidism    LAFB (left anterior fascicular block)    Long term current use of antithrombotics/antiplatelets    a.) on DAPT therapy (ASA + clopidogrel)   Memory  change    Migraine    Osteoarthritis    Osteoporosis    Palpitations    Premature menopause    QT prolongation    Seizures ([place]) 1994   history of mini seizures, possible migraine induced   Stage I adenocarcinoma of endometrium ([place]) 12/18/2020   a.) stage 1A; grade 1 (pT1a, pN0); b.) s/p TLH/BSO 12/18/2020 + adjuvant vaginal brachytherapy (30 Gy over 5 fractions between 02/11/2021 - 03/14/2021)   Vitamin D deficiency    Past Surgical History:  Procedure Laterality Date   ABDOMINAL HYSTERECTOMY  12/18/2020   ABLATION  2006   AXILLARY SENTINEL NODE BIOPSY Left 09/25/2021   Procedure: AXILLARY SENTINEL NODE BIOPSY;  Surgeon: Robert Bellow, MD;  Location: [facility] ORS;  Service: General;  Laterality: Left;   BREAST BIOPSY Left 08/21/2021   Stereo Bx, X-clip; path --> IMC (G1, ER/PR +, Her2/neu -)   BREAST LUMPECTOMY WITH NEEDLE LOCALIZATION Left 09/25/2021   Procedure: BREAST LUMPECTOMY WITH NEEDLE LOCALIZATION;  Surgeon: Robert Bellow, MD;  Location: [facility];  Service: General;  Laterality: Left;   BUBBLE STUDY  04/23/2020   Procedure: BUBBLE STUDY;  Surgeon: Larey Dresser, MD;  Location: [facility] ENDOSCOPY;  Service: Cardiovascular;;   CATARACT EXTRACTION W/ INTRAOCULAR LENS IMPLANT Bilateral    COLONOSCOPY  COLONOSCOPY WITH PROPOFOL N/A 12/22/2018   Procedure: COLONOSCOPY WITH PROPOFOL;  Surgeon: Virgel Manifold, MD;  Location: [facility] ENDOSCOPY;  Service: Endoscopy;  Laterality: N/A;   EYE SURGERY  Spring 2018   Cataracts both eyes   HYSTEROSCOPY WITH D & C N/A 11/01/2020   Procedure: DILATATION AND CURETTAGE /HYSTEROSCOPY WITH MYOSURE;  Surgeon: Lafonda Mosses, MD;  Location: [facility];  Service: Gynecology;  Laterality: N/A;   LAPAROTOMY  12/18/2020   Procedure: LAPAROTOMY;  Surgeon: Lafonda Mosses, MD;  Location: [facility];  Service: Gynecology;;   LOOP RECORDER INSERTION N/A 04/23/2020   Procedure: LOOP RECORDER INSERTION;  Surgeon: Evans Lance, MD;  Location:  [place] [facility];  Service: Cardiovascular;  Laterality: N/A;   ROBOTIC ASSISTED TOTAL HYSTERECTOMY WITH BILATERAL SALPINGO OOPHERECTOMY Bilateral 12/18/2020   Procedure: XI ROBOTIC ASSISTED TOTAL HYSTERECTOMY WITH BILATERAL SALPINGO OOPHORECTOMY;  Surgeon: Lafonda Mosses, MD;  Location: [facility];  Service: Gynecology;  Laterality: Bilateral;   SENTINEL NODE BIOPSY N/A 12/18/2020   Procedure: SENTINEL NODE BIOPSY;  Surgeon: Lafonda Mosses, MD;  Location: [facility];  Service: Gynecology;  Laterality: N/A;   TEE WITHOUT CARDIOVERSION N/A 04/23/2020   Procedure: TRANSESOPHAGEAL ECHOCARDIOGRAM (TEE);  Surgeon: Larey Dresser, MD;  Location: [facility];  Service: Cardiovascular;  Laterality: N/A;   TONSILLECTOMY     Patient Active Problem List   Diagnosis Date Noted   Cerebrovascular accident (CVA) ([place]) 12/05/2021   Acute CVA (cerebrovascular accident) ([place]) 12/04/2021   Trigger finger, right ring finger 09/10/2021   Dupuytren's contracture of both hands 09/10/2021   Vitamin D deficiency 09/10/2021   Malignant neoplasm of left breast in female, estrogen receptor positive ([place]) 09/01/2021   Dyslipidemia 05/16/2021   Major depression, recurrent, chronic ([place]) 03/13/2021   Mild protein-calorie malnutrition ([place]) 03/13/2021   Endometrial cancer ([place]) 12/26/2020   Postoperative anemia due to acute blood loss 12/19/2020   History of CVA in adulthood 04/19/2020   History of ischemic multifocal posterior circulation stroke 02/29/2020   Difficulty hearing 06/28/2015   Arthritis, degenerative 06/28/2015   Paresthesia 06/28/2015   Purpura, nonthrombopenic ([place]) 06/28/2015   Adult hypothyroidism 08/09/2014   Migraine without aura and without status migrainosus, not intractable 09/19/2008   Moderate major depression ([place]) 09/07/2006   OP (osteoporosis) 09/07/2006    ONSET DATE: date of referral 03/11/2022    REFERRING DIAG: Z86.73 (ICD-10-CM) - Personal history of transient ischemic  attack (TIA), and cerebral infarction without residual deficits     PERTINENT HISTORY:  Pt is a 72 year old female with history of multiple strokes, migraine, "Subjective versus Mild Cognitive Impairment (word finding difficulty, short-term recall), in patient with positive family history of Alzheimer's Disease, elevated p-tau, unremarkable SNFL," moderate non-positional obstructive sleep apnea, subjective upper extremity weakness, slight ataxia, imbalance, decreased sensatio to bilateral lower extremity, hyperreflexia as well as chemo radiation for endometrial cancer and recent radiation to her left breast. .    DIAGNOSTIC FINDINGS:  12/05/2021 MRI   No acute infarction, hemorrhage, hydrocephalus, extra-axial collection or mass lesion. Small amount of scattered foci of T2 hyperintensity are seen within the white matter of the cerebral hemispheres and within the pons, nonspecific, most likely related to chronic small vessel ischemia. Remote cortical infarcts the left parietal and occipital lobes. Tiny remote infarct in the left thalamus.   07/17/2020 MRI There is no evidence of an acute infarct, mass, midline shift, or extra-axial fluid collection. Scattered small T2 hyperintensities in the cerebral white matter bilaterally are  nonspecific but compatible with mild chronic small vessel ischemic disease. There is mild encephalomalacia corresponding to the small posterior left MCA territory infarcts which were acute on the prior MRI. Chronic microhemorrhages are again noted posteriorly in both cerebral hemispheres. A chronic lacunar infarct in the left thalamus is unchanged from the prior MRI.   04/19/2020 MRI Generalized age appropriate cerebral volume. Mild chronic microvascular ischemic disease again noted.   Patchy small volume foci of restricted diffusion seen involving the cortical and subcortical aspect of the posterior left frontoparietal and temporal region, consistent with  posterior left MCA distribution infarcts. For reference purposes, the largest of these foci seen at the posterior left temporal region and measures 8 mm. Findings are embolic in distribution. No associated hemorrhage or mass effect.   Additional apparent subcentimeter focus of mild diffusion abnormality at the posterior right frontoparietal centrum semi ovale favored to reflect T2 shine through (series 2, image 34). No other evidence for acute or subacute ischemia. Gray-white matter differentiation otherwise maintained. No acute intracranial hemorrhage. Small focus of susceptibility artifact at the posterior right temporal region noted, consistent with a small chronic microhemorrhage. Additional minimal residual chronic blood products noted about the occipital regions related to recently identified ischemic infarcts.   02/28/2020 MRI Volume cerebral volume within normal limits for age. Minimal scattered patchy T2/FLAIR hyperintensity within the periventricular white matter and pons, most like related chronic microvascular ischemic disease, mild for age.   There are a few scattered punctate foci of diffusion abnormality seen involving the cortical/subcortical aspects of both parieto-occipital regions (series 5, images 29, 24, 20, 18), consistent with tiny acute to subacute ischemic infarcts. Associated minimal petechial hemorrhage (series 13, image 25). Subtle associated postcontrast enhancement consistent with subacute ischemia (series 32, images 113, 81). No malignant hemorrhagic transformation or significant regional mass effect. No evidence for subarachnoid hemorrhage within this region on prior head CT.   02/28/2020  Few scattered punctate acute to subacute ischemic infarcts involving the cortical/subcortical aspects of both parieto-occipital Regions consistent with tiny acute to subacute ischemic infarcts.  2. No other acute intracranial abnormality. 3. Underlying mild  chronic microvascular ischemic disease.  THERAPY DIAG:  Cognitive communication deficit  Rationale for Evaluation and Treatment Rehabilitation  SUBJECTIVE: Pt missed last week's session d/t vet emergency with her pet; arrived following appt at cancer center   Pt accompanied by: self  PAIN:  Are you having pain? No  PATIENT GOALS: to improve memory   OBJECTIVE:   TODAY'S TREATMENT: Skilled treatment session focused on pt's language goals, specifically memory. SLP facilitated session by providing the following interventions:     Pt arrives to session "having a pity party." Pt alternating between crying and frustration "It just seems like my life is being chirped away from me over the last 2.5 years"       Pt demonstrates understanding and initial use of using external memory aids, rehearsal of information for increased fluent reading.   Pt reports consistent use of strategies, "everything goes in there" (pointing to her phone).    Recommend counseling to help with age related medical conditions.  PATIENT EDUCATION: Education details: see above Person educated: Patient Education method: Explanation, Verbal cues, and Handouts Education comprehension: verbalized understanding  HOME EXERCISE PROGRAM: Continue using [place]; paragraph level readings found within Prayer Book    GOALS: Goals reviewed with patient? Yes  SHORT TERM GOALS: Target date: 10 sessions  Pt will demonstrate comprehension of verbal information related to  health conditions with 75% accuracy and moderate cues.  Baseline: Goal status: MET  2.  Pt will use external aids to manage appointments, chores, shopping in 4 out 6 opportunities with moderate cues.  Baseline:  Goal status: MET  3.  Pt will complete moderately complex reasoning tasks with 75% accuracy in a reasonable amount of time with double checking, given moderate cues.  Baseline:  Goal status: MET   LONG  TERM GOALS: Target date: 06/01/2022  Pt will use strategies to improve memory for important information with 75% acc. with Mod I (ie., white board, daily planner/calendar, Apps on phone).  Baseline:  Goal status: MET  2.   With Mod I, Patient will demonstrate anticipatory awareness by identifying cognitive-communication barriers and using appropriate compensatory strategies in 75% of opportunities.  Baseline:  Goal status: MET  3.  Pt will complete moderately complex reasoning tasks with > 90% accuracy in a reasonable amount of time with double checking, with use of strategies.  Baseline:  Goal status: MET   ASSESSMENT:  CLINICAL IMPRESSION: Pt has made progress over the course of skilled ST sessions. As a result she has met all of her goals and is appropriate for discharge from services.    Alphonzo Grieve, SLP Graduate Clinician    Hani Patnode B. Rutherford Nail, M.S., CCC-SLP, Mining engineer Certified Brain Injury [place]  [place] Office [phone] Ascom [phone] Fax [phone]

## 2022-04-22 NOTE — Progress Notes (Signed)
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  Diagnostic mammogram and ultrasound showed indeterminate left breast asymmetry as well as a probable benign right-sided breast mass.  Biopsy of both the masses was recommended.  However the second mass resolved at the time of biopsy likely consistent with fat necrosis from anticoagulate use.  Left breast mass biopsy showed invasive mammary carcinoma grade 1 ER greater than 90% positive, PR negative and HER2 negative.   Personal history of endometrial cancer in November 2022 s/p surgery and adjuvant radiation therapy.   Posterior circulation stroke in January 2022 for which she is on dual antiplatelet therapy.   Final pathology showed a 5 mm tubular carcinoma grade 1 ER greater than 90% positive PR 10% positive and HER2 negative.  Negative margins.  3 sentinel lymph nodes negative for malignancy.  PT1AN0.  Patient has baseline osteoporosis.  Tamoxifen was therefore referred but patient has not started taking it yet.   Hx of TIA and tPA.   Interval history- Patient is 72 year old female who returns to  clinic for breast cancer surveillance and possible initiation of endocrine therapy. She is nervous about possible side effects of medications and ongoing health issues. She had to put her elderly dog to sleep last week and continues to grieve. She denies falls or fractures. No breast lumps, pain, or skin changes. No vaginal bleeding.   ECOG PS- 0 Pain scale- 0  Review of systems- Review of Systems  Constitutional:  Negative for chills, fever, malaise/fatigue and weight loss.  HENT:  Negative for congestion, ear discharge and nosebleeds.   Eyes:  Negative for blurred vision.  Respiratory:  Negative for cough, hemoptysis, sputum production, shortness of breath and wheezing.   Cardiovascular:  Negative for chest pain, palpitations, orthopnea and claudication.  Gastrointestinal:  Negative for abdominal pain, blood in stool, constipation, diarrhea, heartburn, melena, nausea and vomiting.  Genitourinary:  Negative for dysuria, flank pain, frequency, hematuria and urgency.  Musculoskeletal:  Negative for back pain, joint pain and myalgias.  Skin:  Negative for rash.  Neurological:  Negative for dizziness, tingling, focal weakness, seizures, weakness and headaches.  Endo/Heme/Allergies:  Does not bruise/bleed easily.  Psychiatric/Behavioral:  Negative for depression and suicidal ideas. The patient does not have insomnia.     Allergies  Allergen Reactions   Sulfamethoxazole-Trimethoprim Hives   Tnkase [Tenecteplase] Swelling    Mild focal left upper lip swelling   Past Medical History:  Diagnosis Date   Acute bronchospasm due to viral infection 2020   Due to pollen   Acute ischemic left MCA stroke ([place]) 04/19/2020   a.) CTA head/neck 04/19/2020 --> nearly occlusive thrombus at bifurcation of a proximal M2 MCA branch approximately 6 mm from the MCA bifurcation. Subsequent occlusion of  a proximal left M3 MCA branch   Allergy    Anemia    Angina at rest    Breast cancer, left ([place]) 08/21/2021    a.) stereotactic Bx 08/21/2021 --> IMC (G1, ER/PR +, Her2/neu -)   Cataract    a.) s/p extraction with IOL placement   Depressive disorder    Diverticulosis    Dyspnea    Epistaxis    Fatigue    Hearing loss    History of 2019 novel coronavirus disease (COVID-19) 12/18/2020   History of ischemic multifocal posterior circulation stroke 02/28/2020   a.) presented to ED with increasing episodes of  transient vertical binocular diplopia; MRI 02/27/2021 --> multiple punctate acute to subacute ischemic infarcts involving the cortical/subcortical aspects of both parieto-occipital regions   History of loop recorder    History of radiation therapy    Vagina-02/11/21-03/14/21- Dr. Gery Pray   HLD (hyperlipidemia)    Hypothyroidism    LAFB (left anterior fascicular block)    Long term current use of antithrombotics/antiplatelets    a.) on DAPT therapy (ASA + clopidogrel)   Memory change    Migraine    Osteoarthritis    Osteoporosis    Palpitations    Premature menopause    QT prolongation    Seizures ([place]) 1994   history of mini seizures, possible migraine induced   Stage I adenocarcinoma of endometrium ([place]) 12/18/2020   a.) stage 1A; grade 1 (pT1a, pN0); b.) s/p TLH/BSO 12/18/2020 + adjuvant vaginal brachytherapy (30 Gy over 5 fractions between 02/11/2021 - 03/14/2021)   Vitamin D deficiency    Past Surgical History:  Procedure Laterality Date   ABDOMINAL HYSTERECTOMY  12/18/2020   ABLATION  2006   AXILLARY SENTINEL NODE BIOPSY Left 09/25/2021   Procedure: AXILLARY SENTINEL NODE BIOPSY;  Surgeon: Robert Bellow, MD;  Location: [facility];  Service: General;  Laterality: Left;   BREAST BIOPSY Left 08/21/2021   Stereo Bx, X-clip; path --> IMC (G1, ER/PR +, Her2/neu -)   BREAST LUMPECTOMY WITH NEEDLE LOCALIZATION Left 09/25/2021   Procedure: BREAST LUMPECTOMY WITH NEEDLE LOCALIZATION;  Surgeon: Robert Bellow, MD;  Location: [facility];  Service: General;  Laterality: Left;    BUBBLE STUDY  04/23/2020   Procedure: BUBBLE STUDY;  Surgeon: Larey Dresser, MD;  Location: [place];  Service: Cardiovascular;;   CATARACT EXTRACTION W/ INTRAOCULAR LENS IMPLANT Bilateral    COLONOSCOPY     COLONOSCOPY WITH PROPOFOL N/A 12/22/2018   Procedure: COLONOSCOPY WITH PROPOFOL;  Surgeon: Virgel Manifold, MD;  Location: [facility];  Service: Endoscopy;  Laterality: N/A;   EYE SURGERY  Spring 2018   Cataracts both eyes   HYSTEROSCOPY WITH D & C N/A 11/01/2020   Procedure: DILATATION AND CURETTAGE /HYSTEROSCOPY WITH MYOSURE;  Surgeon: Lafonda Mosses, MD;  Location: [facility];  Service: Gynecology;  Laterality: N/A;   LAPAROTOMY  12/18/2020   Procedure: LAPAROTOMY;  Surgeon: Lafonda Mosses, MD;  Location: [facility];  Service: Gynecology;;   LOOP RECORDER INSERTION N/A 04/23/2020   Procedure: LOOP RECORDER INSERTION;  Surgeon: Evans Lance, MD;  Location: [place] [facility];  Service: Cardiovascular;  Laterality: N/A;   ROBOTIC ASSISTED TOTAL HYSTERECTOMY WITH BILATERAL SALPINGO OOPHERECTOMY Bilateral 12/18/2020   Procedure: XI ROBOTIC ASSISTED TOTAL HYSTERECTOMY WITH BILATERAL SALPINGO OOPHORECTOMY;  Surgeon: Lafonda Mosses, MD;  Location: [facility];  Service: Gynecology;  Laterality: Bilateral;   SENTINEL NODE BIOPSY N/A 12/18/2020   Procedure: SENTINEL NODE BIOPSY;  Surgeon: Berline Lopes,  Corinna Lines, MD;  Location: [facility];  Service: Gynecology;  Laterality: N/A;   TEE WITHOUT CARDIOVERSION N/A 04/23/2020   Procedure: TRANSESOPHAGEAL ECHOCARDIOGRAM (TEE);  Surgeon: Larey Dresser, MD;  Location: [facility];  Service: Cardiovascular;  Laterality: N/A;   TONSILLECTOMY     Social History   Socioeconomic History   Marital status: Single    Spouse name: Not on file   Number of children: 1   Years of education: Not on file   Highest education level: Bachelor's degree (e.g., BA, AB, BS)  Occupational History   Occupation: retired   Tobacco Use   Smoking  status: Former    Packs/day: 0.50    Years: 6.00    Additional pack years: 0.00    Total pack years: 3.00    Types: Cigarettes    Quit date: 02/11/1974    Years since quitting: 48.2   Smokeless tobacco: Never  Vaping Use   Vaping Use: Never used  Substance and Sexual Activity   Alcohol use: Not Currently   Drug use: Never   Sexual activity: Not Currently    Comment: Don't use not sexually active  Other Topics Concern   Not on file  Social History Narrative   Raised an adopted child on her own   Working part time reviewed documents   Social Determinants of Health   Financial Resource Strain: [place]  (04/03/2022)   Overall Financial Resource Strain (CARDIA)    Difficulty of Paying Living Expenses: Not very hard  Food Insecurity: No Food Insecurity (04/03/2022)   Hunger Vital Sign    Worried About Running Out of Food in the Last Year: Never true    [place] in the Last Year: Never true  Transportation Needs: No Transportation Needs (04/03/2022)   PRAPARE - Hydrologist (Medical): No    Lack of Transportation (Non-Medical): No  Physical Activity: Insufficiently Active (04/03/2022)   Exercise Vital Sign    Days of Exercise per Week: 2 days    Minutes of Exercise per Session: 30 min  Stress: No Stress Concern Present (04/03/2022)   Cut Off    Feeling of Stress : Not at all  Social Connections: Moderately Integrated (04/03/2022)   Social Connection and Isolation Panel [NHANES]    Frequency of Communication with Friends and Family: Three times a week    Frequency of Social Gatherings with Friends and Family: More than three times a week    Attends Religious Services: More than 4 times per year    Active Member of [employer] or Organizations: Yes    Attends Archivist Meetings: More than 4 times per year    Marital Status: Never married  Intimate Partner Violence: Not At  Risk (04/03/2022)   Humiliation, Afraid, Rape, and Kick questionnaire    Fear of Current or Ex-Partner: No    Emotionally Abused: No    Physically Abused: No    Sexually Abused: No   Family History  Problem Relation Age of Onset   Early death Father        suicide   Depression Father    Diabetes Father    Cancer Father        prostate   Hearing loss Father        due to war   Alzheimer's disease Mother    Heart disease Brother    Atrial fibrillation Brother    Heart disease  Maternal Aunt    Dementia Maternal Aunt    Heart disease Maternal Uncle    Dementia Maternal Grandmother    Heart disease Brother    Atrial fibrillation Brother    Colon cancer Neg Hx    Breast cancer Neg Hx    Ovarian cancer Neg Hx    Pancreatic cancer Neg Hx    Endometrial cancer Neg Hx    Current Outpatient Medications:    alendronate (FOSAMAX) 70 MG tablet, TAKE 1 TABLET(70 MG) BY MOUTH 1 TIME A WEEK WITH A FULL GLASS OF WATER AND ON AN EMPTY STOMACH, Disp: 4 tablet, Rfl: 5   aspirin 81 MG EC tablet, Take by mouth., Disp: , Rfl:    aspirin-acetaminophen-caffeine (EXCEDRIN MIGRAINE) 250-250-65 MG tablet, Take by mouth every 6 (six) hours as needed for headache or migraine., Disp: , Rfl:    atorvastatin (LIPITOR) 40 MG tablet, Take 1 tablet (40 mg total) by mouth daily., Disp: 90 tablet, Rfl: 1   buPROPion (WELLBUTRIN SR) 150 MG 12 hr tablet, Take 1 tablet (150 mg total) by mouth 2 (two) times daily., Disp: 180 tablet, Rfl: 1   citalopram (CELEXA) 40 MG tablet, TAKE 1 TABLET BY MOUTH EVERY DAY, Disp: 90 tablet, Rfl: 1   clopidogrel (PLAVIX) 75 MG tablet, Take 1 tablet (75 mg total) by mouth daily., Disp: 1 tablet, Rfl: 0   Cyanocobalamin (VITAMIN B-12) 1000 MCG SUBL, Place 1 tablet (1,000 mcg total) under the tongue 2 (two) times a week., Disp: 30 tablet, Rfl: 0   levothyroxine (SYNTHROID) 75 MCG tablet, TAKE 1 TABLET(75 MCG) BY MOUTH DAILY BEFORE BREAKFAST, Disp: 90 tablet, Rfl: 0   Multiple Vitamin  (MULTIVITAMIN WITH MINERALS) TABS tablet, Take 1 tablet by mouth daily., Disp: , Rfl:    polyvinyl alcohol (LIQUIFILM TEARS) 1.4 % ophthalmic solution, Place 1 drop into both eyes daily as needed for dry eyes., Disp: , Rfl:    Ubrogepant (UBRELVY) 100 MG TABS, Take 100 mg by mouth daily as needed (migraines)., Disp: , Rfl:    Vitamin D, Ergocalciferol, (DRISDOL) 1.25 MG (50000 UNIT) CAPS capsule, Take 1 capsule (50,000 Units total) by mouth every 14 (fourteen) days., Disp: 6 capsule, Rfl: 1  Physical exam:  Vitals:   04/22/22 1042  BP: 133/77  Pulse: 62  Resp: 20  Temp: 97.8 F (36.6 C)  SpO2: 100%  Weight: 146 lb 14.4 oz (66.6 kg)   Physical Exam Vitals reviewed.  Constitutional:      Appearance: She is not ill-appearing.  Cardiovascular:     Rate and Rhythm: Normal rate and regular rhythm.  Pulmonary:     Effort: Pulmonary effort is normal.  Abdominal:     General: Bowel sounds are normal. There is no distension.     Palpations: Abdomen is soft.     Tenderness: There is no abdominal tenderness.  Musculoskeletal:        General: No deformity.     Comments: Ambulates w/o aids  Skin:    General: Skin is warm and dry.     Coloration: Skin is not pale.  Neurological:     Mental Status: She is alert and oriented to person, place, and time.  Psychiatric:        Mood and Affect: Mood normal.        Behavior: Behavior normal.         Latest Ref Rng & Units 04/10/2022    4:30 PM  CMP  Glucose 70 - 99 mg/dL 83  BUN 8 - 23 mg/dL 20   Creatinine 0.44 - 1.00 mg/dL 0.74   Sodium 135 - 145 mmol/L 136   Potassium 3.5 - 5.1 mmol/L 4.1   Chloride 98 - 111 mmol/L 105   CO2 22 - 32 mmol/L 25   Calcium 8.9 - 10.3 mg/dL 8.9   Total Protein 6.5 - 8.1 g/dL 6.9   Total Bilirubin 0.3 - 1.2 mg/dL 1.1   Alkaline Phos 38 - 126 U/L 57   AST 15 - 41 U/L 28   ALT 0 - 44 U/L 27       Latest Ref Rng & Units 04/10/2022    4:30 PM  CBC  WBC 4.0 - 10.5 K/uL 4.1   Hemoglobin 12.0 - 15.0 g/dL  12.9   Hematocrit 36.0 - 46.0 % 39.1   Platelets 150 - 400 K/uL 196     No images are attached to the encounter.  MR BRAIN WO CONTRAST  Result Date: 04/10/2022 CLINICAL DATA:  Intermittent dizzy spells EXAM: MRI HEAD WITHOUT CONTRAST TECHNIQUE: Multiplanar, multiecho pulse sequences of the brain and surrounding structures were obtained without intravenous contrast. COMPARISON:  12/05/2021 MRI head, correlation is also made with 04/10/2022 CT head FINDINGS: Brain: No restricted diffusion to suggest acute or subacute infarct. No acute hemorrhage, mass, mass effect, or midline shift. No hydrocephalus or extra-axial collection. Normal craniocervical junction. No hemosiderin deposition to suggest remote hemorrhage. Cerebral volume is within normal limits for age. Remote left parietal and occipital cortical infarcts and remote left thalamic lacunar infarct. Scattered T2 hyperintense signal in the periventricular white matter and pons, likely the sequela of chronic small vessel ischemic disease. Vascular: Normal arterial flow voids. Skull and upper cervical spine: Normal marrow signal. Sinuses/Orbits: Clear paranasal sinuses. No acute finding in the orbits. Periventricular white matter changes, likely the sequela of chronic small vessel ischemic disease. Other: The mastoid air cells are well aerated. IMPRESSION: No acute intracranial process. No evidence of acute or subacute infarct. Electronically Signed   By: Merilyn Baba M.D.   On: 04/10/2022 22:20   CT HEAD WO CONTRAST  Result Date: 04/10/2022 CLINICAL DATA:  Blurry vision.  Dizziness. EXAM: CT HEAD WITHOUT CONTRAST TECHNIQUE: Contiguous axial images were obtained from the base of the skull through the vertex without intravenous contrast. RADIATION DOSE REDUCTION: This exam was performed according to the departmental dose-optimization program which includes automated exposure control, adjustment of the mA and/or kV according to patient size and/or use of  iterative reconstruction technique. COMPARISON:  CT Brain 12/04/21 FINDINGS: Brain: There is asymmetric hypodensity in the medial aspect of the right temporal lobe, which likely involves the cortex (series 4, image 27). This is not previously visualized on 12/04/2021. No evidence of hemorrhage, hydrocephalus, extra-axial collection or mass lesion/mass effect. Vascular: No hyperdense vessel or unexpected calcification. Skull: Normal. Negative for fracture or focal lesion. Sinuses/Orbits: No middle ear or mastoid effusion. Paranasal sinuses are clear. Bilateral lens replacement. Orbits are otherwise unremarkable. Other: None. IMPRESSION: Asymmetric hypodensity in the medial aspect of the right temporal lobe, which likely involves the cortex. This is not previously visualized on 12/04/2021, and is concerning for an acute infarct. Whild this may be artifactual, further evaluation with a brain MRI is recommend to exclude the possibility of an acute infarct. Electronically Signed   By: Marin Roberts M.D.   On: 04/10/2022 16:52     Assessment and plan- Patient is a 72 y.o. female with   Stage Ia invasive mammary carcinoma/tubular carcinoma of  the left breast - pT1 aN0 M0 ER/PR positive HER2 negative. She had a small 5 mm tubular, grade 1 invasive mammary carcinoma of the left breast, s/p lumpectomy followed by radiation therapy. Given ER/PR positivity, adjuvant endocrine therapy was recommended. She has some baseline osteoporosis so initially tamoxifen was considered. However, given recurrent TIAs, AI is safer option with monitoring of her bone health. Sh eis in agreement. Start letrozole 2.5 mg daily. Reviewed side effects and rationale for medication. She will be due for routine mammogram in late June 2024. Ordered today.  Endometrial Cancer- s/p TRH-BSO followed by vaginal brachytherapy, completed February 2023, followed by Dr Berline Lopes. Clinically, asymptomatic of recurrence. Continue surveillance per gyn onc.   Recurrent strokes & TIA- she is on aspirin and plavix. Tamoxifen was thought to be an option for her but she had another TIA. Antiphospholipid workup was negative 01/2022. Feel given her history, tamoxifen is too  high risk. Dr Janese Banks agrees.  Osteoporosis- Last bone density 07/10/21 showed t score of -3.2. We reviewed that medications such as AIs can increase bone loss and worsen osteoporosis. We can consider bisophosphonates in the future but hold for now d/t frequent appointments and mandibular implant for OSA upcoming. If bone density worsens, can reconsider IV formulations. For now, continue oral fosamax. She has been taking fosamax since 09/2021. Continue calcium and vitamin d. Exercise as tolerated.   Disposition:  3 mo- see Dr Janese Banks Late June- mammogram- [place]   Visit Diagnosis 1. Encounter for follow-up surveillance of breast cancer   2. Osteoporosis without current pathological fracture, unspecified osteoporosis type    Beckey Rutter, DNP, AGNP-C, [facility] [phone] (clinic) 04/22/2022

## 2022-04-24 ENCOUNTER — Ambulatory Visit: Payer: Medicare Other | Admitting: Physical Therapy

## 2022-04-24 ENCOUNTER — Ambulatory Visit: Payer: Medicare Other | Admitting: Speech Pathology

## 2022-04-24 DIAGNOSIS — R41841 Cognitive communication deficit: Secondary | ICD-10-CM | POA: Diagnosis not present

## 2022-04-24 DIAGNOSIS — R2689 Other abnormalities of gait and mobility: Secondary | ICD-10-CM | POA: Diagnosis not present

## 2022-04-24 DIAGNOSIS — R2681 Unsteadiness on feet: Secondary | ICD-10-CM | POA: Diagnosis not present

## 2022-04-24 DIAGNOSIS — R262 Difficulty in walking, not elsewhere classified: Secondary | ICD-10-CM

## 2022-04-24 DIAGNOSIS — Z8673 Personal history of transient ischemic attack (TIA), and cerebral infarction without residual deficits: Secondary | ICD-10-CM | POA: Diagnosis not present

## 2022-04-24 DIAGNOSIS — I63412 Cerebral infarction due to embolism of left middle cerebral artery: Secondary | ICD-10-CM | POA: Diagnosis not present

## 2022-04-24 DIAGNOSIS — R269 Unspecified abnormalities of gait and mobility: Secondary | ICD-10-CM

## 2022-04-24 NOTE — Therapy (Signed)
OUTPATIENT PHYSICAL THERAPY NEURO TREATMENT   Patient Name: Natalie [name] MRN: [id number] DOB:Apr 12, 1950, 72 y.o., female Today's Date: 04/24/2022   PCP: [name], MD REFERRING PROVIDER: Vladimir Crofts, MD   END OF SESSION:  PT End of Session - 04/24/22 1433     Visit Number 5    Number of Visits 24    Date for PT Re-Evaluation 06/16/22    Authorization Type Traditional Medicare with [employer] Supplemental: VL based on certification    Authorization Time Period 03/24/22-06/16/22    Progress Note Due on Visit 10    PT Start Time 1433    PT Stop Time 1514    PT Time Calculation (min) 41 min    Equipment Utilized During Treatment Gait belt    Activity Tolerance Patient tolerated treatment well;No increased pain    Behavior During Therapy WFL for tasks assessed/performed                Past Medical History:  Diagnosis Date   Acute bronchospasm due to viral infection 2020   Due to pollen   Acute ischemic left MCA stroke ([place]) 04/19/2020   a.) CTA head/neck 04/19/2020 --> nearly occlusive thrombus at bifurcation of a proximal M2 MCA branch approximately 6 mm from the MCA bifurcation. Subsequent occlusion of a proximal left M3 MCA branch   Allergy    Anemia    Angina at rest    Breast cancer, left ([place]) 08/21/2021   a.) stereotactic Bx 08/21/2021 --> IMC (G1, ER/PR +, Her2/neu -)   Cataract    a.) s/p extraction with IOL placement   Depressive disorder    Diverticulosis    Dyspnea    Epistaxis    Fatigue    Hearing loss    History of 2019 novel coronavirus disease (COVID-19) 12/18/2020   History of ischemic multifocal posterior circulation stroke 02/28/2020   a.) presented to ED with increasing episodes of  transient vertical binocular diplopia; MRI 02/27/2021 --> multiple punctate acute to subacute ischemic infarcts involving the cortical/subcortical aspects of both parieto-occipital regions   History of loop recorder    History of radiation therapy     Vagina-02/11/21-03/14/21- Dr. Gery Pray   HLD (hyperlipidemia)    Hypothyroidism    LAFB (left anterior fascicular block)    Long term current use of antithrombotics/antiplatelets    a.) on DAPT therapy (ASA + clopidogrel)   Memory change    Migraine    Osteoarthritis    Osteoporosis    Palpitations    Premature menopause    QT prolongation    Seizures ([place]) 1994   history of mini seizures, possible migraine induced   Stage I adenocarcinoma of endometrium ([place]) 12/18/2020   a.) stage 1A; grade 1 (pT1a, pN0); b.) s/p TLH/BSO 12/18/2020 + adjuvant vaginal brachytherapy (30 Gy over 5 fractions between 02/11/2021 - 03/14/2021)   Vitamin D deficiency    Past Surgical History:  Procedure Laterality Date   ABDOMINAL HYSTERECTOMY  12/18/2020   ABLATION  2006   AXILLARY SENTINEL NODE BIOPSY Left 09/25/2021   Procedure: AXILLARY SENTINEL NODE BIOPSY;  Surgeon: Robert Bellow, MD;  Location: [facility] ORS;  Service: General;  Laterality: Left;   BREAST BIOPSY Left 08/21/2021   Stereo Bx, X-clip; path --> IMC (G1, ER/PR +, Her2/neu -)   BREAST LUMPECTOMY WITH NEEDLE LOCALIZATION Left 09/25/2021   Procedure: BREAST LUMPECTOMY WITH NEEDLE LOCALIZATION;  Surgeon: Robert Bellow, MD;  Location: [facility] OR[facility];  Service: General;  Laterality: Left;  BUBBLE STUDY  04/23/2020   Procedure: BUBBLE STUDY;  Surgeon: Larey Dresser, MD;  Location: [facility] ENDOSCOPY;  Service: Cardiovascular;;   CATARACT EXTRACTION W/ INTRAOCULAR LENS IMPLANT Bilateral    COLONOSCOPY     COLONOSCOPY WITH PROPOFOL N/A 12/22/2018   Procedure: COLONOSCOPY WITH PROPOFOL;  Surgeon: Virgel Manifold, MD;  Location: [facility] ENDOSCOPY;  Service: Endoscopy;  Laterality: N/A;   EYE SURGERY  Spring 2018   Cataracts both eyes   HYSTEROSCOPY WITH D & C N/A 11/01/2020   Procedure: DILATATION AND CURETTAGE /HYSTEROSCOPY WITH MYOSURE;  Surgeon: Lafonda Mosses, MD;  Location: [facility];  Service: Gynecology;  Laterality: N/A;    LAPAROTOMY  12/18/2020   Procedure: LAPAROTOMY;  Surgeon: Lafonda Mosses, MD;  Location: [facility];  Service: Gynecology;;   LOOP RECORDER INSERTION N/A 04/23/2020   Procedure: LOOP RECORDER INSERTION;  Surgeon: Evans Lance, MD;  Location: [place] [facility];  Service: Cardiovascular;  Laterality: N/A;   ROBOTIC ASSISTED TOTAL HYSTERECTOMY WITH BILATERAL SALPINGO OOPHERECTOMY Bilateral 12/18/2020   Procedure: XI ROBOTIC ASSISTED TOTAL HYSTERECTOMY WITH BILATERAL SALPINGO OOPHORECTOMY;  Surgeon: Lafonda Mosses, MD;  Location: [facility];  Service: Gynecology;  Laterality: Bilateral;   SENTINEL NODE BIOPSY N/A 12/18/2020   Procedure: SENTINEL NODE BIOPSY;  Surgeon: Lafonda Mosses, MD;  Location: [facility];  Service: Gynecology;  Laterality: N/A;   TEE WITHOUT CARDIOVERSION N/A 04/23/2020   Procedure: TRANSESOPHAGEAL ECHOCARDIOGRAM (TEE);  Surgeon: Larey Dresser, MD;  Location: [facility] ENDOSCOPY;  Service: Cardiovascular;  Laterality: N/A;   TONSILLECTOMY     Patient Active Problem List   Diagnosis Date Noted   Cerebrovascular accident (CVA) ([place]) 12/05/2021   Acute CVA (cerebrovascular accident) ([place]) 12/04/2021   Trigger finger, right ring finger 09/10/2021   Dupuytren's contracture of both hands 09/10/2021   Vitamin D deficiency 09/10/2021   Malignant neoplasm of left breast in female, estrogen receptor positive ([place]) 09/01/2021   Dyslipidemia 05/16/2021   Major depression, recurrent, chronic ([place]) 03/13/2021   Mild protein-calorie malnutrition ([place]) 03/13/2021   Endometrial cancer ([place]) 12/26/2020   Postoperative anemia due to acute blood loss 12/19/2020   History of CVA in adulthood 04/19/2020   History of ischemic multifocal posterior circulation stroke 02/29/2020   Difficulty hearing 06/28/2015   Arthritis, degenerative 06/28/2015   Paresthesia 06/28/2015   Purpura, nonthrombopenic ([place]) 06/28/2015   Adult hypothyroidism 08/09/2014   Migraine without aura and without status  migrainosus, not intractable 09/19/2008   Moderate major depression ([place]) 09/07/2006   OP (osteoporosis) 09/07/2006    ONSET DATE: 04/19/20  REFERRING DIAG: [id number] (ICD-10-CM) - Personal history of transient ischemic attack (TIA), and cerebral infarction without residual deficits   THERAPY DIAG:  No diagnosis found.  Rationale for Evaluation and Treatment: Rehabilitation  SUBJECTIVE:  SUBJECTIVE STATEMENT: Patient reports she is still sad from her dog passing away and discharging this appointment but does not think she would have done well around other people due to the recency of her dog passing.   PERTINENT HISTORY: Pt is a 72 year old female with history of multiple strokes, migraine, "Subjective versus Mild Cognitive Impairment (word finding difficulty, short-term recall), in patient with positive family history of Alzheimer's Disease, elevated p-tau, unremarkable SNFL," moderate non-positional obstructive sleep apnea, subjective upper extremity weakness, slight ataxia, imbalance, decreased sensation to bilateral lower extremity, hyperreflexia as well as chemo radiation for endometrial cancer and recent radiation to her left breast.   PAIN:  Are you having pain? No  PRECAUTIONS: None  WEIGHT BEARING RESTRICTIONS: No  FALLS: Has patient fallen in last 6 months? Yes. Number of falls 1 fall   LIVING ENVIRONMENT:  Lives with:  Lives with DTR who drops in and drops in once in a week Lives in: House/apartment Stairs: Yes: Internal: 15 steps; on left going up and External: 5 steps; can reach both Has following equipment at home: Single point cane  PLOF: Independent  PATIENT GOALS: walking straight ( without veering), feel more comfortable walking out and about in the park, improve walking speed.    OBJECTIVE:   TODAY'S TREATMENT:                                                                                                                                NMR:   1 foot on Airex 1 foot on 6 inch step holding 30- 45 seconds on each side x 3 sets ea   Airex balance playing word game  X 4 min   Ambulation in the hallway with various balance tasks ( retro ambulation, object finding with head turns, eyes closed)  -Good balance overall with no stumbles but patient did have occasional list to the right particularly with eyes closed  TE  Well zone leg press X 15 reps at L 4  3 x 10 reps at L5  Well zone heel raises on leg press at level four 3 x 15 reps   PATIENT EDUCATION: Allow for adequate rest breaks when motor fatigue intereferes with quality work of balance training.   HOME EXERCISE PROGRAM: Access Code: [email or web address] URL: [email or web address] Date: 04/02/2022 Prepared by: Sande Brothers  Exercises - Tandem Stance  - 3 x weekly - 3 sets - 20-30 sec hold - Single Leg Stance  - 3 x weekly - 3-4 sets - up to 20 sec hold    Access Code: [email or web address]  URL: [email or web address]  Date: 03/27/2022  Prepared by: [place]   Exercises -  Sit to Stand - 1 x daily - 4 x weekly - 3 sets - 10 reps -  Standing Hip Abduction with Counter Support - 1 x daily - 4 x weekly - 3 sets - 15 reps -  Seated Heel Raise - 1 x daily -  4 x weekly - 3 sets - 15 reps -  Heel rises with counter support - 1 x daily - 4 x weekly - 3 sets - 15 reps -  Standing March with Counter Support - 1 x daily - 4 x weekly - 3 sets - 15 reps   GOALS: Goals reviewed with patient? Yes  SHORT TERM GOALS: Target date: 04/22/2022       Patient will be independent in home exercise program to improve strength/mobility for better functional independence with ADLs. Baseline: No HEP currently  Goal status: INITIAL   LONG TERM GOALS: Target date: 06/17/2022      1.   Patient will increase FOTO score to equal to or greater than  55   to demonstrate statistically significant improvement in mobility and quality of life.  Baseline: 52 Goal status: INITIAL   2.  Patient will increase Mini Best Balance score by > 4 points to demonstrate decreased fall risk during functional activities. Baseline: 19 Goal status: INITIAL   3.   Patient will reduce timed up and go to <11 seconds to reduce fall risk and demonstrate improved transfer/gait ability. Baseline: 14.6 sec Goal status: INITIAL  4.   Patient will increase 10 meter walk test to >1.58m/s as to improve gait speed for better community ambulation and to reduce fall risk. Baseline: .89 m/s Goal status: INITIAL  5.   Patient will increase six minute walk test distance to >1630 feet and with no signs of imbalance for progression to super community ambulator. Baseline: 03/27/22: 1484ft, no device, intermitent drifting/sway/crossover Goal status: REVISED     ASSESSMENT:  CLINICAL IMPRESSION: Patient presents with good motivation for completion of physical therapy activities.she continues to progress with static balance interventions but also progresses to more dynamic balance interventions involving head turns and various difficulties to task with overall good results.  Patient also introduced to well zone based therapeutic exercise and strengthening so she can work on this more independently when she goes to the gym and for improved carryover.  Pt will continue to benefit from skilled physical therapy intervention to address impairments, improve QOL, and attain therapy goals.    OBJECTIVE IMPAIRMENTS: Abnormal gait, decreased activity tolerance, decreased balance, decreased endurance, decreased mobility, difficulty walking, decreased ROM, decreased strength, and impaired perceived functional ability.   ACTIVITY LIMITATIONS: standing, squatting, stairs, and locomotion level  PARTICIPATION LIMITATIONS:  cleaning, shopping, community activity, and yard work  PERSONAL FACTORS: Time since onset of injury/illness/exacerbation and 3+ comorbidities: ischemic stroke, cancer in remission, arthritis,   are also affecting patient's functional outcome.   REHAB POTENTIAL: Good  CLINICAL DECISION MAKING: Stable/uncomplicated  EVALUATION COMPLEXITY: Low  PLAN:  PT FREQUENCY: 1-2x/week  PT DURATION: 12 weeks  PLANNED INTERVENTIONS: Therapeutic exercises, Therapeutic activity, Neuromuscular re-education, Balance training, Gait training, Patient/Family education, Self Care, Joint mobilization, and Stair training  PLAN FOR NEXT SESSION: Continue to monitor vitals as appropriate, advance HEP with balance exercises, gait based balance, introduced to activities she can perform in the well zone prior to discharge  Particia Lather, PT 04/24/2022, 5:00 PM  5:00 PM, 04/24/22 Physical Therapist - [place] [phone]

## 2022-04-29 ENCOUNTER — Ambulatory Visit: Payer: Medicare Other

## 2022-04-29 DIAGNOSIS — R41841 Cognitive communication deficit: Secondary | ICD-10-CM | POA: Diagnosis not present

## 2022-04-29 DIAGNOSIS — I63412 Cerebral infarction due to embolism of left middle cerebral artery: Secondary | ICD-10-CM | POA: Diagnosis not present

## 2022-04-29 DIAGNOSIS — R262 Difficulty in walking, not elsewhere classified: Secondary | ICD-10-CM

## 2022-04-29 DIAGNOSIS — R2681 Unsteadiness on feet: Secondary | ICD-10-CM | POA: Diagnosis not present

## 2022-04-29 DIAGNOSIS — Z8673 Personal history of transient ischemic attack (TIA), and cerebral infarction without residual deficits: Secondary | ICD-10-CM

## 2022-04-29 DIAGNOSIS — R2689 Other abnormalities of gait and mobility: Secondary | ICD-10-CM

## 2022-04-29 DIAGNOSIS — M6281 Muscle weakness (generalized): Secondary | ICD-10-CM

## 2022-04-29 DIAGNOSIS — R269 Unspecified abnormalities of gait and mobility: Secondary | ICD-10-CM

## 2022-04-29 NOTE — Therapy (Signed)
OUTPATIENT PHYSICAL THERAPY NEURO TREATMENT   Patient Name: Natalie [name] MRN: [id number] DOB:1950/08/03, 72 y.o., female Today's Date: 04/30/2022   PCP: [name], MD REFERRING PROVIDER: Vladimir Crofts, MD   END OF SESSION:  PT End of Session - 04/29/22 1604     Visit Number 6    Number of Visits 24    Date for PT Re-Evaluation 06/16/22    Authorization Type Traditional Medicare with [employer] Supplemental: VL based on certification    Authorization Time Period 03/24/22-06/16/22    Progress Note Due on Visit 10    PT Start Time 1602    PT Stop Time 1644    PT Time Calculation (min) 42 min    Equipment Utilized During Treatment Gait belt    Activity Tolerance Patient tolerated treatment well;No increased pain    Behavior During Therapy WFL for tasks assessed/performed                 Past Medical History:  Diagnosis Date   Acute bronchospasm due to viral infection 2020   Due to pollen   Acute ischemic left MCA stroke ([place]) 04/19/2020   a.) CTA head/neck 04/19/2020 --> nearly occlusive thrombus at bifurcation of a proximal M2 MCA branch approximately 6 mm from the MCA bifurcation. Subsequent occlusion of a proximal left M3 MCA branch   Allergy    Anemia    Angina at rest    Breast cancer, left ([place]) 08/21/2021   a.) stereotactic Bx 08/21/2021 --> IMC (G1, ER/PR +, Her2/neu -)   Cataract    a.) s/p extraction with IOL placement   Depressive disorder    Diverticulosis    Dyspnea    Epistaxis    Fatigue    Hearing loss    History of 2019 novel coronavirus disease (COVID-19) 12/18/2020   History of ischemic multifocal posterior circulation stroke 02/28/2020   a.) presented to ED with increasing episodes of  transient vertical binocular diplopia; MRI 02/27/2021 --> multiple punctate acute to subacute ischemic infarcts involving the cortical/subcortical aspects of both parieto-occipital regions   History of loop recorder    History of radiation therapy     Vagina-02/11/21-03/14/21- Dr. Gery Pray   HLD (hyperlipidemia)    Hypothyroidism    LAFB (left anterior fascicular block)    Long term current use of antithrombotics/antiplatelets    a.) on DAPT therapy (ASA + clopidogrel)   Memory change    Migraine    Osteoarthritis    Osteoporosis    Palpitations    Premature menopause    QT prolongation    Seizures ([place]) 1994   history of mini seizures, possible migraine induced   Stage I adenocarcinoma of endometrium ([place]) 12/18/2020   a.) stage 1A; grade 1 (pT1a, pN0); b.) s/p TLH/BSO 12/18/2020 + adjuvant vaginal brachytherapy (30 Gy over 5 fractions between 02/11/2021 - 03/14/2021)   Vitamin D deficiency    Past Surgical History:  Procedure Laterality Date   ABDOMINAL HYSTERECTOMY  12/18/2020   ABLATION  2006   AXILLARY SENTINEL NODE BIOPSY Left 09/25/2021   Procedure: AXILLARY SENTINEL NODE BIOPSY;  Surgeon: Robert Bellow, MD;  Location: [facility] ORS;  Service: General;  Laterality: Left;   BREAST BIOPSY Left 08/21/2021   Stereo Bx, X-clip; path --> IMC (G1, ER/PR +, Her2/neu -)   BREAST LUMPECTOMY WITH NEEDLE LOCALIZATION Left 09/25/2021   Procedure: BREAST LUMPECTOMY WITH NEEDLE LOCALIZATION;  Surgeon: Robert Bellow, MD;  Location: [facility];  Service: General;  Laterality: Left;  BUBBLE STUDY  04/23/2020   Procedure: BUBBLE STUDY;  Surgeon: Larey Dresser, MD;  Location: [facility] ENDOSCOPY;  Service: Cardiovascular;;   CATARACT EXTRACTION W/ INTRAOCULAR LENS IMPLANT Bilateral    COLONOSCOPY     COLONOSCOPY WITH PROPOFOL N/A 12/22/2018   Procedure: COLONOSCOPY WITH PROPOFOL;  Surgeon: Virgel Manifold, MD;  Location: [facility] ENDOSCOPY;  Service: Endoscopy;  Laterality: N/A;   EYE SURGERY  Spring 2018   Cataracts both eyes   HYSTEROSCOPY WITH D & C N/A 11/01/2020   Procedure: DILATATION AND CURETTAGE /HYSTEROSCOPY WITH MYOSURE;  Surgeon: Lafonda Mosses, MD;  Location: [facility];  Service: Gynecology;  Laterality: N/A;    LAPAROTOMY  12/18/2020   Procedure: LAPAROTOMY;  Surgeon: Lafonda Mosses, MD;  Location: [facility];  Service: Gynecology;;   LOOP RECORDER INSERTION N/A 04/23/2020   Procedure: LOOP RECORDER INSERTION;  Surgeon: Evans Lance, MD;  Location: [place] [facility];  Service: Cardiovascular;  Laterality: N/A;   ROBOTIC ASSISTED TOTAL HYSTERECTOMY WITH BILATERAL SALPINGO OOPHERECTOMY Bilateral 12/18/2020   Procedure: XI ROBOTIC ASSISTED TOTAL HYSTERECTOMY WITH BILATERAL SALPINGO OOPHORECTOMY;  Surgeon: Lafonda Mosses, MD;  Location: [facility];  Service: Gynecology;  Laterality: Bilateral;   SENTINEL NODE BIOPSY N/A 12/18/2020   Procedure: SENTINEL NODE BIOPSY;  Surgeon: Lafonda Mosses, MD;  Location: [facility];  Service: Gynecology;  Laterality: N/A;   TEE WITHOUT CARDIOVERSION N/A 04/23/2020   Procedure: TRANSESOPHAGEAL ECHOCARDIOGRAM (TEE);  Surgeon: Larey Dresser, MD;  Location: [facility] ENDOSCOPY;  Service: Cardiovascular;  Laterality: N/A;   TONSILLECTOMY     Patient Active Problem List   Diagnosis Date Noted   Cerebrovascular accident (CVA) ([place]) 12/05/2021   Acute CVA (cerebrovascular accident) ([place]) 12/04/2021   Trigger finger, right ring finger 09/10/2021   Dupuytren's contracture of both hands 09/10/2021   Vitamin D deficiency 09/10/2021   Malignant neoplasm of left breast in female, estrogen receptor positive ([place]) 09/01/2021   Dyslipidemia 05/16/2021   Major depression, recurrent, chronic ([place]) 03/13/2021   Mild protein-calorie malnutrition ([place]) 03/13/2021   Endometrial cancer ([place]) 12/26/2020   Postoperative anemia due to acute blood loss 12/19/2020   History of CVA in adulthood 04/19/2020   History of ischemic multifocal posterior circulation stroke 02/29/2020   Difficulty hearing 06/28/2015   Arthritis, degenerative 06/28/2015   Paresthesia 06/28/2015   Purpura, nonthrombopenic ([place]) 06/28/2015   Adult hypothyroidism 08/09/2014   Migraine without aura and without status  migrainosus, not intractable 09/19/2008   Moderate major depression ([place]) 09/07/2006   OP (osteoporosis) 09/07/2006    ONSET DATE: 04/19/20  REFERRING DIAG: [id number] (ICD-10-CM) - Personal history of transient ischemic attack (TIA), and cerebral infarction without residual deficits   THERAPY DIAG:  Unsteadiness on feet  Other abnormalities of gait and mobility  Difficulty in walking, not elsewhere classified  Abnormality of gait and mobility  Muscle weakness (generalized)  Cognitive communication deficit  Personal history of transient ischemic attack (TIA), and cerebral infarction without residual deficits  Cerebrovascular accident (CVA) due to embolism of left middle cerebral artery ([place])  Rationale for Evaluation and Treatment: Rehabilitation  SUBJECTIVE:  SUBJECTIVE STATEMENT: Patient reports feeling better- more energized today.    PERTINENT HISTORY: Pt is a 72 year old female with history of multiple strokes, migraine, "Subjective versus Mild Cognitive Impairment (word finding difficulty, short-term recall), in patient with positive family history of Alzheimer's Disease, elevated p-tau, unremarkable SNFL," moderate non-positional obstructive sleep apnea, subjective upper extremity weakness, slight ataxia, imbalance, decreased sensation to bilateral lower extremity, hyperreflexia as well as chemo radiation for endometrial cancer and recent radiation to her left breast.   PAIN:  Are you having pain? No  PRECAUTIONS: None  WEIGHT BEARING RESTRICTIONS: No  FALLS: Has patient fallen in last 6 months? Yes. Number of falls 1 fall   LIVING ENVIRONMENT:  Lives with:  Lives with DTR who drops in and drops in once in a week Lives in: House/apartment Stairs: Yes: Internal: 15 steps; on left  going up and External: 5 steps; can reach both Has following equipment at home: Single point cane  PLOF: Independent  PATIENT GOALS: walking straight ( without veering), feel more comfortable walking out and about in the park, improve walking speed.   OBJECTIVE:   TODAY'S TREATMENT:                                                                                                                                NMR:    Dynamic  high knee march- on airex pad x 15 reps  without UE support   1 foot on Airex 1 foot on purple pad (tandem) holding 30 seconds on each side x 3 sets ea     Airex balance beam walk in // bars x 5 down and back (intermittent reaching with UE earlier - did improve with practice)    Therex- Wellzone  Leg press (VC and physical assist for set up - Seat level 5 today)  1 set x15 reps at L 4 3 sets x 10 reps at L5   Calf raises on leg press at level  3 x 15 reps  Knee ext (seat level 4) (leg length = 4)  1 set of 10 = level 3 (feels heavy) - dropped  to level for 2 sets of 10 reps  Knee flex (seat level 4) (leg length= 14)  2 sets of 10 reps     PATIENT EDUCATION: Allow for adequate rest breaks when motor fatigue intereferes with quality work of balance training.   HOME EXERCISE PROGRAM: Access Code: [email or web address] URL: [email or web address] Date: 04/02/2022 Prepared by: Sande Brothers  Exercises - Tandem Stance  - 3 x weekly - 3 sets - 20-30 sec hold - Single Leg Stance  - 3 x weekly - 3-4 sets - up to 20 sec hold    Access Code: [email or web address]  URL: [email or web address]  Date: 03/27/2022  Prepared by: Sharon Nation   Exercises -  Sit to Stand - 1 x daily - 4 x weekly - 3 sets - 10 reps -  Standing Hip  Abduction with Counter Support - 1 x daily - 4 x weekly - 3 sets - 15 reps -  Seated Heel Raise - 1 x daily - 4 x weekly - 3 sets - 15 reps -  Heel rises with counter support - 1 x daily - 4 x weekly - 3 sets - 15 reps -   Standing March with Counter Support - 1 x daily - 4 x weekly - 3 sets - 15 reps   GOALS: Goals reviewed with patient? Yes  SHORT TERM GOALS: Target date: 04/22/2022       Patient will be independent in home exercise program to improve strength/mobility for better functional independence with ADLs. Baseline: No HEP currently  Goal status: INITIAL   LONG TERM GOALS: Target date: 06/17/2022      1.  Patient will increase FOTO score to equal to or greater than  55   to demonstrate statistically significant improvement in mobility and quality of life.  Baseline: 52 Goal status: INITIAL   2.  Patient will increase Mini Best Balance score by > 4 points to demonstrate decreased fall risk during functional activities. Baseline: 19 Goal status: INITIAL   3.   Patient will reduce timed up and go to <11 seconds to reduce fall risk and demonstrate improved transfer/gait ability. Baseline: 14.6 sec Goal status: INITIAL  4.   Patient will increase 10 meter walk test to >1.74m/s as to improve gait speed for better community ambulation and to reduce fall risk. Baseline: .89 m/s Goal status: INITIAL  5.   Patient will increase six minute walk test distance to >1630 feet and with no signs of imbalance for progression to super community ambulator. Baseline: 03/27/22: 1434ft, no device, intermitent drifting/sway/crossover Goal status: REVISED     ASSESSMENT:  CLINICAL IMPRESSION: Patient presented with excellent motivation today. She initially was challenged with balance activities but improved with confidence and ability very quickly. She continues to require explanation and VC/physical assist to perform newer well zone LE strengthening but also exhibited good return of demonstration with safe technique.    Pt will continue to benefit from skilled physical therapy intervention to address impairments, improve QOL, and attain therapy goals.    OBJECTIVE IMPAIRMENTS: Abnormal gait,  decreased activity tolerance, decreased balance, decreased endurance, decreased mobility, difficulty walking, decreased ROM, decreased strength, and impaired perceived functional ability.   ACTIVITY LIMITATIONS: standing, squatting, stairs, and locomotion level  PARTICIPATION LIMITATIONS: cleaning, shopping, community activity, and yard work  PERSONAL FACTORS: Time since onset of injury/illness/exacerbation and 3+ comorbidities: ischemic stroke, cancer in remission, arthritis,   are also affecting patient's functional outcome.   REHAB POTENTIAL: Good  CLINICAL DECISION MAKING: Stable/uncomplicated  EVALUATION COMPLEXITY: Low  PLAN:  PT FREQUENCY: 1-2x/week  PT DURATION: 12 weeks  PLANNED INTERVENTIONS: Therapeutic exercises, Therapeutic activity, Neuromuscular re-education, Balance training, Gait training, Patient/Family education, Self Care, Joint mobilization, and Stair training  PLAN FOR NEXT SESSION: Continue to monitor vitals as appropriate, advance HEP with balance exercises, gait based balance, continue with instructing in  activities she can perform in the well zone prior to discharge  Lewis Moccasin, PT 04/30/2022, 8:14 AM  8:14 AM, 04/30/22 Physical Therapist - [place] [phone]

## 2022-05-01 ENCOUNTER — Ambulatory Visit: Payer: Medicare Other | Admitting: Speech Pathology

## 2022-05-06 ENCOUNTER — Encounter: Payer: Medicare Other | Admitting: Speech Pathology

## 2022-05-06 ENCOUNTER — Ambulatory Visit: Payer: Medicare Other | Attending: Neurology | Admitting: Physical Therapy

## 2022-05-06 DIAGNOSIS — R262 Difficulty in walking, not elsewhere classified: Secondary | ICD-10-CM | POA: Diagnosis not present

## 2022-05-06 DIAGNOSIS — R269 Unspecified abnormalities of gait and mobility: Secondary | ICD-10-CM | POA: Diagnosis not present

## 2022-05-06 DIAGNOSIS — M6281 Muscle weakness (generalized): Secondary | ICD-10-CM | POA: Diagnosis not present

## 2022-05-06 DIAGNOSIS — R2689 Other abnormalities of gait and mobility: Secondary | ICD-10-CM

## 2022-05-06 DIAGNOSIS — R2681 Unsteadiness on feet: Secondary | ICD-10-CM | POA: Diagnosis not present

## 2022-05-06 NOTE — Therapy (Signed)
OUTPATIENT PHYSICAL THERAPY NEURO TREATMENT   Patient Name: Natalie [name] MRN: [id number] DOB:[birth year], 72 y.o., female Today's Date: 05/06/2022   PCP: [name], MD REFERRING PROVIDER: Vladimir Crofts, MD   END OF SESSION:  PT End of Session - 05/06/22 1519     Visit Number 7    Number of Visits 24    Date for PT Re-Evaluation 06/16/22    Authorization Type Traditional Medicare with [employer] Supplemental: VL based on certification    Authorization Time Period 03/24/22-06/16/22    Progress Note Due on Visit 10    PT Start Time 1519    PT Stop Time 1600    PT Time Calculation (min) 41 min    Equipment Utilized During Treatment Gait belt    Activity Tolerance Patient tolerated treatment well;No increased pain    Behavior During Therapy WFL for tasks assessed/performed                 Past Medical History:  Diagnosis Date   Acute bronchospasm due to viral infection 2020   Due to pollen   Acute ischemic left MCA stroke ([place]) 04/19/2020   a.) CTA head/neck 04/19/2020 --> nearly occlusive thrombus at bifurcation of a proximal M2 MCA branch approximately 6 mm from the MCA bifurcation. Subsequent occlusion of a proximal left M3 MCA branch   Allergy    Anemia    Angina at rest    Breast cancer, left ([place]) 08/21/2021   a.) stereotactic Bx 08/21/2021 --> IMC (G1, ER/PR +, Her2/neu -)   Cataract    a.) s/p extraction with IOL placement   Depressive disorder    Diverticulosis    Dyspnea    Epistaxis    Fatigue    Hearing loss    History of 2019 novel coronavirus disease (COVID-19) 12/18/2020   History of ischemic multifocal posterior circulation stroke 02/28/2020   a.) presented to ED with increasing episodes of  transient vertical binocular diplopia; MRI 02/27/2021 --> multiple punctate acute to subacute ischemic infarcts involving the cortical/subcortical aspects of both parieto-occipital regions   History of loop recorder    History of radiation therapy     Vagina-02/11/21-03/14/21- Dr. Gery Pray   HLD (hyperlipidemia)    Hypothyroidism    LAFB (left anterior fascicular block)    Long term current use of antithrombotics/antiplatelets    a.) on DAPT therapy (ASA + clopidogrel)   Memory change    Migraine    Osteoarthritis    Osteoporosis    Palpitations    Premature menopause    QT prolongation    Seizures ([place]) 1994   history of mini seizures, possible migraine induced   Stage I adenocarcinoma of endometrium ([place]) 12/18/2020   a.) stage 1A; grade 1 (pT1a, pN0); b.) s/p TLH/BSO 12/18/2020 + adjuvant vaginal brachytherapy (30 Gy over 5 fractions between 02/11/2021 - 03/14/2021)   Vitamin D deficiency    Past Surgical History:  Procedure Laterality Date   ABDOMINAL HYSTERECTOMY  12/18/2020   ABLATION  2006   AXILLARY SENTINEL NODE BIOPSY Left 09/25/2021   Procedure: AXILLARY SENTINEL NODE BIOPSY;  Surgeon: Robert Bellow, MD;  Location: [facility] ORS;  Service: General;  Laterality: Left;   BREAST BIOPSY Left 08/21/2021   Stereo Bx, X-clip; path --> IMC (G1, ER/PR +, Her2/neu -)   BREAST LUMPECTOMY WITH NEEDLE LOCALIZATION Left 09/25/2021   Procedure: BREAST LUMPECTOMY WITH NEEDLE LOCALIZATION;  Surgeon: Robert Bellow, MD;  Location: [facility];  Service: General;  Laterality: Left;  BUBBLE STUDY  04/23/2020   Procedure: BUBBLE STUDY;  Surgeon: Larey Dresser, MD;  Location: [facility] ENDOSCOPY;  Service: Cardiovascular;;   CATARACT EXTRACTION W/ INTRAOCULAR LENS IMPLANT Bilateral    COLONOSCOPY     COLONOSCOPY WITH PROPOFOL N/A 12/22/2018   Procedure: COLONOSCOPY WITH PROPOFOL;  Surgeon: Virgel Manifold, MD;  Location: [facility] ENDOSCOPY;  Service: Endoscopy;  Laterality: N/A;   EYE SURGERY  Spring 2018   Cataracts both eyes   HYSTEROSCOPY WITH D & C N/A 11/01/2020   Procedure: DILATATION AND CURETTAGE /HYSTEROSCOPY WITH MYOSURE;  Surgeon: Lafonda Mosses, MD;  Location: [facility];  Service: Gynecology;  Laterality: N/A;    LAPAROTOMY  12/18/2020   Procedure: LAPAROTOMY;  Surgeon: Lafonda Mosses, MD;  Location: [facility];  Service: Gynecology;;   LOOP RECORDER INSERTION N/A 04/23/2020   Procedure: LOOP RECORDER INSERTION;  Surgeon: Evans Lance, MD;  Location: [facility];  Service: Cardiovascular;  Laterality: N/A;   ROBOTIC ASSISTED TOTAL HYSTERECTOMY WITH BILATERAL SALPINGO OOPHERECTOMY Bilateral 12/18/2020   Procedure: XI ROBOTIC ASSISTED TOTAL HYSTERECTOMY WITH BILATERAL SALPINGO OOPHORECTOMY;  Surgeon: Lafonda Mosses, MD;  Location: [facility];  Service: Gynecology;  Laterality: Bilateral;   SENTINEL NODE BIOPSY N/A 12/18/2020   Procedure: SENTINEL NODE BIOPSY;  Surgeon: Lafonda Mosses, MD;  Location: [facility];  Service: Gynecology;  Laterality: N/A;   TEE WITHOUT CARDIOVERSION N/A 04/23/2020   Procedure: TRANSESOPHAGEAL ECHOCARDIOGRAM (TEE);  Surgeon: Larey Dresser, MD;  Location: [facility] ENDOSCOPY;  Service: Cardiovascular;  Laterality: N/A;   TONSILLECTOMY     Patient Active Problem List   Diagnosis Date Noted   Cerebrovascular accident (CVA) 12/05/2021   Acute CVA (cerebrovascular accident) 12/04/2021   Trigger finger, right ring finger 09/10/2021   Dupuytren's contracture of both hands 09/10/2021   Vitamin D deficiency 09/10/2021   Malignant neoplasm of left breast in female, estrogen receptor positive 09/01/2021   Dyslipidemia 05/16/2021   Major depression, recurrent, chronic 03/13/2021   Mild protein-calorie malnutrition 03/13/2021   Endometrial cancer 12/26/2020   Postoperative anemia due to acute blood loss 12/19/2020   History of CVA in adulthood 04/19/2020   History of ischemic multifocal posterior circulation stroke 02/29/2020   Difficulty hearing 06/28/2015   Arthritis, degenerative 06/28/2015   Paresthesia 06/28/2015   Purpura, nonthrombopenic 06/28/2015   Adult hypothyroidism 08/09/2014   Migraine without aura and without status migrainosus, not intractable 09/19/2008    Moderate major depression 09/07/2006   OP (osteoporosis) 09/07/2006    ONSET DATE: 04/19/20  REFERRING DIAG: [id number] (ICD-10-CM) - Personal history of transient ischemic attack (TIA), and cerebral infarction without residual deficits   THERAPY DIAG:  Unsteadiness on feet  Other abnormalities of gait and mobility  Difficulty in walking, not elsewhere classified  Abnormality of gait and mobility  Muscle weakness (generalized)  Rationale for Evaluation and Treatment: Rehabilitation  SUBJECTIVE:  SUBJECTIVE STATEMENT: Reports that she is more tired today, no reason given, but knows that it will be beneficial to continue to come to therapy, even if she is tired.      PERTINENT HISTORY: Pt is a 72 year old female with history of multiple strokes, migraine, "Subjective versus Mild Cognitive Impairment (word finding difficulty, short-term recall), in patient with positive family history of Alzheimer's Disease, elevated p-tau, unremarkable SNFL," moderate non-positional obstructive sleep apnea, subjective upper extremity weakness, slight ataxia, imbalance, decreased sensation to bilateral lower extremity, hyperreflexia as well as chemo radiation for endometrial cancer and recent radiation to her left breast.   PAIN:  Are you having pain? No  PRECAUTIONS: None  WEIGHT BEARING RESTRICTIONS: No  FALLS: Has patient fallen in last 6 months? Yes. Number of falls 1 fall   LIVING ENVIRONMENT:  Lives with:  Lives with DTR who drops in and drops in once in a week Lives in: House/apartment Stairs: Yes: Internal: 15 steps; on left going up and External: 5 steps; can reach both Has following equipment at home: Single point cane  PLOF: Independent  PATIENT GOALS: walking straight ( without veering), feel  more comfortable walking out and about in the park, improve walking speed.   OBJECTIVE:   TODAY'S TREATMENT:                                                                                                                                NMR:   Nustep BLE/BUE reciprocal movement training. X 5 min instruction to keep SPM >70. Performed 2 min level 2, 3 min level 4. HR 96bpm   Orthostatic BP assessment.  Sitting: 142/78 HR 69  Standing: 118/82 HR 74 mild orthostatic s/s, but resolved in <45 sec Standing 2 min 118/86, HR 72.  Standing BLE strengthening. Hip abduction x 12 bil GTB  Hip adduction x12 bil GTB Hip extension x 12 Bil GTB Hip flexion x 12 bil GTB  Dynamic gait training in parallel bars with 3# ankle weight Side stepping R and L x 5 bil  Forward/reverse x 5 bil  Stepping over canes on floor in reciprocal pattern 70ft x 10 with CGA for safety.   Min cues for posture and improved step height on the LLE throughout intervention from PT.      PATIENT EDUCATION: Allow for adequate rest breaks when motor fatigue intereferes with quality work of balance training.   HOME EXERCISE PROGRAM: Access Code: [email or web address] URL: [email or web address] Date: 04/02/2022 Prepared by: Sande Brothers  Exercises - Tandem Stance  - 3 x weekly - 3 sets - 20-30 sec hold - Single Leg Stance  - 3 x weekly - 3-4 sets - up to 20 sec hold    Access Code: [email or web address]  URL: [email or web address]  Date: 03/27/2022  Prepared by: [place]   Exercises -  Sit to Stand - 1 x daily - 4 x weekly - 3 sets - 10 reps -  Standing Hip Abduction with Counter Support - 1 x daily - 4 x weekly - 3 sets - 15 reps -  Seated Heel Raise - 1 x daily - 4 x weekly - 3 sets - 15 reps -  Heel rises with counter support - 1 x daily - 4 x weekly - 3 sets - 15 reps -  Standing March with Counter Support - 1 x daily - 4 x weekly - 3 sets - 15 reps   GOALS: Goals reviewed with patient?  Yes  SHORT TERM GOALS: Target date: 04/22/2022       Patient will be independent in home exercise program to improve strength/mobility for better functional independence with ADLs. Baseline: No HEP currently  Goal status: INITIAL   LONG TERM GOALS: Target date: 06/17/2022      1.  Patient will increase FOTO score to equal to or greater than  55   to demonstrate statistically significant improvement in mobility and quality of life.  Baseline: 52 Goal status: INITIAL   2.  Patient will increase Mini Best Balance score by > 4 points to demonstrate decreased fall risk during functional activities. Baseline: 19 Goal status: INITIAL   3.   Patient will reduce timed up and go to <11 seconds to reduce fall risk and demonstrate improved transfer/gait ability. Baseline: 14.6 sec Goal status: INITIAL  4.   Patient will increase 10 meter walk test to >1.34m/s as to improve gait speed for better community ambulation and to reduce fall risk. Baseline: .89 m/s Goal status: INITIAL  5.   Patient will increase six minute walk test distance to >1630 feet and with no signs of imbalance for progression to super community ambulator. Baseline: 03/27/22: 1468ft, no device, intermitent drifting/sway/crossover Goal status: REVISED     ASSESSMENT:  CLINICAL IMPRESSION: Patient demonstrated good effort throughout session to PT interventions, despite reports of feeling "off" today. Mild orthostatic s/s but BP WNL with assessment. Continues to feel unsteady on the LLE, but able to adapt to instruction for all exercise with no overt LOB with dynamic movements.   Pt will continue to benefit from skilled physical therapy intervention to address impairments, improve QOL, and attain therapy goals.    OBJECTIVE IMPAIRMENTS: Abnormal gait, decreased activity tolerance, decreased balance, decreased endurance, decreased mobility, difficulty walking, decreased ROM, decreased strength, and impaired perceived  functional ability.   ACTIVITY LIMITATIONS: standing, squatting, stairs, and locomotion level  PARTICIPATION LIMITATIONS: cleaning, shopping, community activity, and yard work  PERSONAL FACTORS: Time since onset of injury/illness/exacerbation and 3+ comorbidities: ischemic stroke, cancer in remission, arthritis,   are also affecting patient's functional outcome.   REHAB POTENTIAL: Good  CLINICAL DECISION MAKING: Stable/uncomplicated  EVALUATION COMPLEXITY: Low  PLAN:  PT FREQUENCY: 1-2x/week  PT DURATION: 12 weeks  PLANNED INTERVENTIONS: Therapeutic exercises, Therapeutic activity, Neuromuscular re-education, Balance training, Gait training, Patient/Family education, Self Care, Joint mobilization, and Stair training  PLAN FOR NEXT SESSION: Continue to monitor vitals as appropriate, advance HEP with balance exercises, gait based balance, continue with instructing in  activities she can perform in the well zone prior to discharge  Lorie Phenix, PT 05/06/2022, 3:19 PM  3:19 PM, 05/06/22 Physical Therapist - [place] [phone]

## 2022-05-08 ENCOUNTER — Encounter: Payer: Medicare Other | Admitting: Speech Pathology

## 2022-05-08 ENCOUNTER — Other Ambulatory Visit: Payer: Self-pay | Admitting: Family Medicine

## 2022-05-08 ENCOUNTER — Ambulatory Visit: Payer: Medicare Other

## 2022-05-08 ENCOUNTER — Telehealth: Payer: Self-pay | Admitting: Family Medicine

## 2022-05-08 DIAGNOSIS — F339 Major depressive disorder, recurrent, unspecified: Secondary | ICD-10-CM

## 2022-05-08 NOTE — Telephone Encounter (Signed)
Pt stated that she was informed that her Rx for buPROPion (WELLBUTRIN SR) 150 MG 12 hr tablet was refused. Per chart, Request refused: Patient has requested refill too soon. Please contact pt to advise. Cb# [phone]

## 2022-05-08 NOTE — Telephone Encounter (Signed)
Martha called and spoke to [facility] about the refill(s) Wellbutrin requested. Advised it was sent on 03/14/22 #180/1 refill(s), advised it was refused due to too early to refill. She says the patient must have called on an old prescription, but they have this on file and will refill for her.

## 2022-05-12 ENCOUNTER — Encounter: Payer: Medicare Other | Admitting: Speech Pathology

## 2022-05-12 ENCOUNTER — Ambulatory Visit: Payer: Medicare Other | Admitting: Physical Therapy

## 2022-05-12 ENCOUNTER — Encounter: Payer: Self-pay | Admitting: Physical Therapy

## 2022-05-12 DIAGNOSIS — R2689 Other abnormalities of gait and mobility: Secondary | ICD-10-CM | POA: Diagnosis not present

## 2022-05-12 DIAGNOSIS — R269 Unspecified abnormalities of gait and mobility: Secondary | ICD-10-CM | POA: Diagnosis not present

## 2022-05-12 DIAGNOSIS — R2681 Unsteadiness on feet: Secondary | ICD-10-CM

## 2022-05-12 DIAGNOSIS — M6281 Muscle weakness (generalized): Secondary | ICD-10-CM

## 2022-05-12 DIAGNOSIS — R262 Difficulty in walking, not elsewhere classified: Secondary | ICD-10-CM | POA: Diagnosis not present

## 2022-05-12 NOTE — Therapy (Signed)
OUTPATIENT PHYSICAL THERAPY NEURO TREATMENT   Patient Name: Natalie [name] MRN: [id number] DOB:03/06/1950, 72 y.o., female Today's Date: 05/12/2022   PCP: Alba CorySowles, Krichna, MD REFERRING PROVIDER: Lonell FaceShah, Hemang K, MD   END OF SESSION:  PT End of Session - 05/12/22 1602     Visit Number 8    Number of Visits 24    Date for PT Re-Evaluation 06/16/22    Authorization Type Traditional Medicare with [employer] Supplemental: VL based on certification    Authorization Time Period 03/24/22-06/16/22    Progress Note Due on Visit 10    PT Start Time 1302    PT Stop Time 1344    PT Time Calculation (min) 42 min    Equipment Utilized During Treatment Gait belt    Activity Tolerance Patient tolerated treatment well;No increased pain    Behavior During Therapy WFL for tasks assessed/performed                  Past Medical History:  Diagnosis Date   Acute bronchospasm due to viral infection 2020   Due to pollen   Acute ischemic left MCA stroke 04/19/2020   a.) CTA head/neck 04/19/2020 --> nearly occlusive thrombus at bifurcation of a proximal M2 MCA branch approximately 6 mm from the MCA bifurcation. Subsequent occlusion of a proximal left M3 MCA branch   Allergy    Anemia    Angina at rest    Breast cancer, left 08/21/2021   a.) stereotactic Bx 08/21/2021 --> IMC (G1, ER/PR +, Her2/neu -)   Cataract    a.) s/p extraction with IOL placement   Depressive disorder    Diverticulosis    Dyspnea    Epistaxis    Fatigue    Hearing loss    History of 2019 novel coronavirus disease (COVID-19) 12/18/2020   History of ischemic multifocal posterior circulation stroke 02/28/2020   a.) presented to ED with increasing episodes of  transient vertical binocular diplopia; MRI 02/27/2021 --> multiple punctate acute to subacute ischemic infarcts involving the cortical/subcortical aspects of both parieto-occipital regions   History of loop recorder    History of radiation therapy     Vagina-02/11/21-03/14/21- Dr. Antony BlackbirdJames Kinard   HLD (hyperlipidemia)    Hypothyroidism    LAFB (left anterior fascicular block)    Long term current use of antithrombotics/antiplatelets    a.) on DAPT therapy (ASA + clopidogrel)   Memory change    Migraine    Osteoarthritis    Osteoporosis    Palpitations    Premature menopause    QT prolongation    Seizures 1994   history of mini seizures, possible migraine induced   Stage I adenocarcinoma of endometrium 12/18/2020   a.) stage 1A; grade 1 (pT1a, pN0); b.) s/p TLH/BSO 12/18/2020 + adjuvant vaginal brachytherapy (30 Gy over 5 fractions between 02/11/2021 - 03/14/2021)   Vitamin D deficiency    Past Surgical History:  Procedure Laterality Date   ABDOMINAL HYSTERECTOMY  12/18/2020   ABLATION  2006   AXILLARY SENTINEL NODE BIOPSY Left 09/25/2021   Procedure: AXILLARY SENTINEL NODE BIOPSY;  Surgeon: Earline MayotteByrnett, Jeffrey W, MD;  Location: [facility] ORS;  Service: General;  Laterality: Left;   BREAST BIOPSY Left 08/21/2021   Stereo Bx, X-clip; path --> IMC (G1, ER/PR +, Her2/neu -)   BREAST LUMPECTOMY WITH NEEDLE LOCALIZATION Left 09/25/2021   Procedure: BREAST LUMPECTOMY WITH NEEDLE LOCALIZATION;  Surgeon: Earline MayotteByrnett, Jeffrey W, MD;  Location: [facility];  Service: General;  Laterality: Left;   BUBBLE  STUDY  04/23/2020   Procedure: BUBBLE STUDY;  Surgeon: Laurey Morale, MD;  Location: [facility] ENDOSCOPY;  Service: Cardiovascular;;   CATARACT EXTRACTION W/ INTRAOCULAR LENS IMPLANT Bilateral    COLONOSCOPY     COLONOSCOPY WITH PROPOFOL N/A 12/22/2018   Procedure: COLONOSCOPY WITH PROPOFOL;  Surgeon: Pasty Spillers, MD;  Location: [facility] ENDOSCOPY;  Service: Endoscopy;  Laterality: N/A;   EYE SURGERY  Spring 2018   Cataracts both eyes   HYSTEROSCOPY WITH D & C N/A 11/01/2020   Procedure: DILATATION AND CURETTAGE /HYSTEROSCOPY WITH MYOSURE;  Surgeon: Carver Fila, MD;  Location: [facility];  Service: Gynecology;  Laterality: N/A;   LAPAROTOMY   12/18/2020   Procedure: LAPAROTOMY;  Surgeon: Carver Fila, MD;  Location: [facility];  Service: Gynecology;;   LOOP RECORDER INSERTION N/A 04/23/2020   Procedure: LOOP RECORDER INSERTION;  Surgeon: Marinus Maw, MD;  Location: [facility] INVASIVE CV LAB;  Service: Cardiovascular;  Laterality: N/A;   ROBOTIC ASSISTED TOTAL HYSTERECTOMY WITH BILATERAL SALPINGO OOPHERECTOMY Bilateral 12/18/2020   Procedure: XI ROBOTIC ASSISTED TOTAL HYSTERECTOMY WITH BILATERAL SALPINGO OOPHORECTOMY;  Surgeon: Carver Fila, MD;  Location: [facility];  Service: Gynecology;  Laterality: Bilateral;   SENTINEL NODE BIOPSY N/A 12/18/2020   Procedure: SENTINEL NODE BIOPSY;  Surgeon: Carver Fila, MD;  Location: [facility];  Service: Gynecology;  Laterality: N/A;   TEE WITHOUT CARDIOVERSION N/A 04/23/2020   Procedure: TRANSESOPHAGEAL ECHOCARDIOGRAM (TEE);  Surgeon: Laurey Morale, MD;  Location: [facility] ENDOSCOPY;  Service: Cardiovascular;  Laterality: N/A;   TONSILLECTOMY     Patient Active Problem List   Diagnosis Date Noted   Cerebrovascular accident (CVA) 12/05/2021   Acute CVA (cerebrovascular accident) 12/04/2021   Trigger finger, right ring finger 09/10/2021   Dupuytren's contracture of both hands 09/10/2021   Vitamin D deficiency 09/10/2021   Malignant neoplasm of left breast in female, estrogen receptor positive 09/01/2021   Dyslipidemia 05/16/2021   Major depression, recurrent, chronic 03/13/2021   Mild protein-calorie malnutrition 03/13/2021   Endometrial cancer 12/26/2020   Postoperative anemia due to acute blood loss 12/19/2020   History of CVA in adulthood 04/19/2020   History of ischemic multifocal posterior circulation stroke 02/29/2020   Difficulty hearing 06/28/2015   Arthritis, degenerative 06/28/2015   Paresthesia 06/28/2015   Purpura, nonthrombopenic 06/28/2015   Adult hypothyroidism 08/09/2014   Migraine without aura and without status migrainosus, not intractable 09/19/2008   Moderate  major depression 09/07/2006   OP (osteoporosis) 09/07/2006    ONSET DATE: 04/19/20  REFERRING DIAG: Y92.44 (ICD-10-CM) - Personal history of transient ischemic attack (TIA), and cerebral infarction without residual deficits   THERAPY DIAG:  Unsteadiness on feet  Other abnormalities of gait and mobility  Difficulty in walking, not elsewhere classified  Abnormality of gait and mobility  Muscle weakness (generalized)  Rationale for Evaluation and Treatment: Rehabilitation  SUBJECTIVE:  SUBJECTIVE STATEMENT:  Pt reports she is doing well, no significant changes. Has been feeling well.    PERTINENT HISTORY: Pt is a 72 year old female with history of multiple strokes, migraine, "Subjective versus Mild Cognitive Impairment (word finding difficulty, short-term recall), in patient with positive family history of Alzheimer's Disease, elevated p-tau, unremarkable SNFL," moderate non-positional obstructive sleep apnea, subjective upper extremity weakness, slight ataxia, imbalance, decreased sensation to bilateral lower extremity, hyperreflexia as well as chemo radiation for endometrial cancer and recent radiation to her left breast.   PAIN:  Are you having pain? No  PRECAUTIONS: None  WEIGHT BEARING RESTRICTIONS: No  FALLS: Has patient fallen in last 6 months? Yes. Number of falls 1 fall   LIVING ENVIRONMENT:  Lives with:  Lives with DTR who drops in and drops in once in a week Lives in: House/apartment Stairs: Yes: Internal: 15 steps; on left going up and External: 5 steps; can reach both Has following equipment at home: Single point cane  PLOF: Independent  PATIENT GOALS: walking straight ( without veering), feel more comfortable walking out and about in the park, improve walking speed.    OBJECTIVE:   TODAY'S TREATMENT:                                                                                                                                NMR:   Ambulation with AW with interval training with seated step over x 4# Aw 310 feet ambulation  Step overs x 10 ea LE   Standing BLE strengthening. Hip abduction x 12 bil 4# AW  Knee flexion x 12 ea LE 4# AW  Hip extension x 12 Bil 4# AW - some back pain moved to seated  Hip flexion x 12 bil 4# AW   Walking on hedgehogs in // bars x several attempts. -improved balance throughout.    Obstacle course with 3 x airex in a row and multiple mats for simulation of unsteady surface and with step up to treadmill. X 3 rounds, good balance and goes from obstacle to obstacle with good confidence       PATIENT EDUCATION: Allow for adequate rest breaks when motor fatigue intereferes with quality work of balance training.   HOME EXERCISE PROGRAM: Access Code: [email or web address] URL: [email or web address] Date: 04/02/2022 Prepared by: Maureen Ralphs  Exercises - Tandem Stance  - 3 x weekly - 3 sets - 20-30 sec hold - Single Leg Stance  - 3 x weekly - 3-4 sets - up to 20 sec hold    Access Code: [email or web address]  URL: [email or web address]  Date: 03/27/2022  Prepared by: Tomasa Hose   Exercises -  Sit to Stand - 1 x daily - 4 x weekly - 3 sets - 10 reps -  Standing Hip Abduction with Counter Support - 1 x daily - 4 x weekly - 3 sets - 15 reps -  Seated Heel Raise - 1 x daily - 4  x weekly - 3 sets - 15 reps -  Heel rises with counter support - 1 x daily - 4 x weekly - 3 sets - 15 reps -  Standing March with Counter Support - 1 x daily - 4 x weekly - 3 sets - 15 reps   GOALS: Goals reviewed with patient? Yes  SHORT TERM GOALS: Target date: 04/22/2022       Patient will be independent in home exercise program to improve strength/mobility for better functional independence with ADLs. Baseline: No  HEP currently  Goal status: INITIAL   LONG TERM GOALS: Target date: 06/17/2022      1.  Patient will increase FOTO score to equal to or greater than  55   to demonstrate statistically significant improvement in mobility and quality of life.  Baseline: 52 Goal status: INITIAL   2.  Patient will increase Mini Best Balance score by > 4 points to demonstrate decreased fall risk during functional activities. Baseline: 19 Goal status: INITIAL   3.   Patient will reduce timed up and go to <11 seconds to reduce fall risk and demonstrate improved transfer/gait ability. Baseline: 14.6 sec Goal status: INITIAL  4.   Patient will increase 10 meter walk test to >1.45m/s as to improve gait speed for better community ambulation and to reduce fall risk. Baseline: .89 m/s Goal status: INITIAL  5.   Patient will increase six minute walk test distance to >1630 feet and with no signs of imbalance for progression to super community ambulator. Baseline: 03/27/22: 1491ft, no device, intermitent drifting/sway/crossover Goal status: REVISED     ASSESSMENT:  CLINICAL IMPRESSION: Pt presents with good motivation for completion of PT activities. Entire session performed with gait belt donned and minGuard assist of patient. Pt shows good balance reactions with high difficulty balance challenges this date. Will continue to encourage to to utilize wellzone gym for carryover of progress following discharge from PT. Pt will continue to benefit from skilled physical therapy intervention to address impairments, improve QOL, and attain therapy goals.   .    OBJECTIVE IMPAIRMENTS: Abnormal gait, decreased activity tolerance, decreased balance, decreased endurance, decreased mobility, difficulty walking, decreased ROM, decreased strength, and impaired perceived functional ability.   ACTIVITY LIMITATIONS: standing, squatting, stairs, and locomotion level  PARTICIPATION LIMITATIONS: cleaning, shopping, community  activity, and yard work  PERSONAL FACTORS: Time since onset of injury/illness/exacerbation and 3+ comorbidities: ischemic stroke, cancer in remission, arthritis,   are also affecting patient's functional outcome.   REHAB POTENTIAL: Good  CLINICAL DECISION MAKING: Stable/uncomplicated  EVALUATION COMPLEXITY: Low  PLAN:  PT FREQUENCY: 1-2x/week  PT DURATION: 12 weeks  PLANNED INTERVENTIONS: Therapeutic exercises, Therapeutic activity, Neuromuscular re-education, Balance training, Gait training, Patient/Family education, Self Care, Joint mobilization, and Stair training  PLAN FOR NEXT SESSION: Continue to monitor vitals as appropriate, advance HEP with balance exercises, gait based balance, continue with instructing in  activities she can perform in the well zone prior to discharge  Norman Herrlich, PT 05/12/2022, 4:03 PM  4:03 PM, 05/12/22 Physical Therapist - [facility]  Outpatient Physical Therapy- Main Campus [phone]

## 2022-05-13 DIAGNOSIS — R413 Other amnesia: Secondary | ICD-10-CM | POA: Diagnosis not present

## 2022-05-15 ENCOUNTER — Ambulatory Visit: Payer: Medicare Other

## 2022-05-15 ENCOUNTER — Encounter: Payer: Medicare Other | Admitting: Speech Pathology

## 2022-05-15 DIAGNOSIS — R2681 Unsteadiness on feet: Secondary | ICD-10-CM

## 2022-05-15 DIAGNOSIS — M6281 Muscle weakness (generalized): Secondary | ICD-10-CM

## 2022-05-15 DIAGNOSIS — R262 Difficulty in walking, not elsewhere classified: Secondary | ICD-10-CM | POA: Diagnosis not present

## 2022-05-15 DIAGNOSIS — R2689 Other abnormalities of gait and mobility: Secondary | ICD-10-CM

## 2022-05-15 DIAGNOSIS — R269 Unspecified abnormalities of gait and mobility: Secondary | ICD-10-CM | POA: Diagnosis not present

## 2022-05-15 NOTE — Therapy (Signed)
OUTPATIENT PHYSICAL THERAPY NEURO TREATMENT   Patient Name: Natalie [name] MRN: [id number] DOB:08/14/1950, 72 y.o., female Today's Date: 05/15/2022   PCP: Alba CorySowles, Krichna, MD REFERRING PROVIDER: Lonell FaceShah, Hemang K, MD   END OF SESSION:  PT End of Session - 05/15/22 1307     Visit Number 9    Number of Visits 24    Date for PT Re-Evaluation 06/16/22    Authorization Type Traditional Medicare with [employer] Supplemental: VL based on certification    Authorization Time Period 03/24/22-06/16/22    Progress Note Due on Visit 10    PT Start Time 1305    PT Stop Time 1344    PT Time Calculation (min) 39 min    Equipment Utilized During Treatment Gait belt    Activity Tolerance Patient tolerated treatment well;No increased pain    Behavior During Therapy WFL for tasks assessed/performed                   Past Medical History:  Diagnosis Date   Acute bronchospasm due to viral infection 2020   Due to pollen   Acute ischemic left MCA stroke 04/19/2020   a.) CTA head/neck 04/19/2020 --> nearly occlusive thrombus at bifurcation of a proximal M2 MCA branch approximately 6 mm from the MCA bifurcation. Subsequent occlusion of a proximal left M3 MCA branch   Allergy    Anemia    Angina at rest    Breast cancer, left 08/21/2021   a.) stereotactic Bx 08/21/2021 --> IMC (G1, ER/PR +, Her2/neu -)   Cataract    a.) s/p extraction with IOL placement   Depressive disorder    Diverticulosis    Dyspnea    Epistaxis    Fatigue    Hearing loss    History of 2019 novel coronavirus disease (COVID-19) 12/18/2020   History of ischemic multifocal posterior circulation stroke 02/28/2020   a.) presented to ED with increasing episodes of  transient vertical binocular diplopia; MRI 02/27/2021 --> multiple punctate acute to subacute ischemic infarcts involving the cortical/subcortical aspects of both parieto-occipital regions   History of loop recorder    History of radiation therapy     Vagina-02/11/21-03/14/21- Dr. Antony BlackbirdJames Kinard   HLD (hyperlipidemia)    Hypothyroidism    LAFB (left anterior fascicular block)    Long term current use of antithrombotics/antiplatelets    a.) on DAPT therapy (ASA + clopidogrel)   Memory change    Migraine    Osteoarthritis    Osteoporosis    Palpitations    Premature menopause    QT prolongation    Seizures 1994   history of mini seizures, possible migraine induced   Stage I adenocarcinoma of endometrium 12/18/2020   a.) stage 1A; grade 1 (pT1a, pN0); b.) s/p TLH/BSO 12/18/2020 + adjuvant vaginal brachytherapy (30 Gy over 5 fractions between 02/11/2021 - 03/14/2021)   Vitamin D deficiency    Past Surgical History:  Procedure Laterality Date   ABDOMINAL HYSTERECTOMY  12/18/2020   ABLATION  2006   AXILLARY SENTINEL NODE BIOPSY Left 09/25/2021   Procedure: AXILLARY SENTINEL NODE BIOPSY;  Surgeon: Earline MayotteByrnett, Gearld Kerstein W, MD;  Location: [facility];  Service: General;  Laterality: Left;   BREAST BIOPSY Left 08/21/2021   Stereo Bx, X-clip; path --> IMC (G1, ER/PR +, Her2/neu -)   BREAST LUMPECTOMY WITH NEEDLE LOCALIZATION Left 09/25/2021   Procedure: BREAST LUMPECTOMY WITH NEEDLE LOCALIZATION;  Surgeon: Earline MayotteByrnett, Abagale Boulos W, MD;  Location: [facility];  Service: General;  Laterality: Left;  BUBBLE STUDY  04/23/2020   Procedure: BUBBLE STUDY;  Surgeon: Laurey Morale, MD;  Location: [facility] ENDOSCOPY;  Service: Cardiovascular;;   CATARACT EXTRACTION W/ INTRAOCULAR LENS IMPLANT Bilateral    COLONOSCOPY     COLONOSCOPY WITH PROPOFOL N/A 12/22/2018   Procedure: COLONOSCOPY WITH PROPOFOL;  Surgeon: Pasty Spillers, MD;  Location: [facility] ENDOSCOPY;  Service: Endoscopy;  Laterality: N/A;   EYE SURGERY  Spring 2018   Cataracts both eyes   HYSTEROSCOPY WITH D & C N/A 11/01/2020   Procedure: DILATATION AND CURETTAGE /HYSTEROSCOPY WITH MYOSURE;  Surgeon: Carver Fila, MD;  Location: [facility];  Service: Gynecology;  Laterality: N/A;   LAPAROTOMY   12/18/2020   Procedure: LAPAROTOMY;  Surgeon: Carver Fila, MD;  Location: [facility];  Service: Gynecology;;   LOOP RECORDER INSERTION N/A 04/23/2020   Procedure: LOOP RECORDER INSERTION;  Surgeon: Marinus Maw, MD;  Location: [facility] INVAS[facility] CV LAB;  Service: Cardiovascular;  Laterality: N/A;   ROBOTIC ASSISTED TOTAL HYSTERECTOMY WITH BILATERAL SALPINGO OOPHERECTOMY Bilateral 12/18/2020   Procedure: XI ROBOTIC ASSISTED TOTAL HYSTERECTOMY WITH BILATERAL SALPINGO OOPHORECTOMY;  Surgeon: Carver Fila, MD;  Location: [facility];  Service: Gynecology;  Laterality: Bilateral;   SENTINEL NODE BIOPSY N/A 12/18/2020   Procedure: SENTINEL NODE BIOPSY;  Surgeon: Carver Fila, MD;  Location: [facility];  Service: Gynecology;  Laterality: N/A;   TEE WITHOUT CARDIOVERSION N/A 04/23/2020   Procedure: TRANSESOPHAGEAL ECHOCARDIOGRAM (TEE);  Surgeon: Laurey Morale, MD;  Location: [facility] ENDOSCOPY;  Service: Cardiovascular;  Laterality: N/A;   TONSILLECTOMY     Patient Active Problem List   Diagnosis Date Noted   Cerebrovascular accident (CVA) 12/05/2021   Acute CVA (cerebrovascular accident) 12/04/2021   Trigger finger, right ring finger 09/10/2021   Dupuytren's contracture of both hands 09/10/2021   Vitamin D deficiency 09/10/2021   Malignant neoplasm of left breast in female, estrogen receptor positive 09/01/2021   Dyslipidemia 05/16/2021   Major depression, recurrent, chronic 03/13/2021   Mild protein-calorie malnutrition 03/13/2021   Endometrial cancer 12/26/2020   Postoperative anemia due to acute blood loss 12/19/2020   History of CVA in adulthood 04/19/2020   History of ischemic multifocal posterior circulation stroke 02/29/2020   Difficulty hearing 06/28/2015   Arthritis, degenerative 06/28/2015   Paresthesia 06/28/2015   Purpura, nonthrombopenic 06/28/2015   Adult hypothyroidism 08/09/2014   Migraine without aura and without status migrainosus, not intractable 09/19/2008   Moderate  major depression 09/07/2006   OP (osteoporosis) 09/07/2006    ONSET DATE: 04/19/20  REFERRING DIAG: S34.19 (ICD-10-CM) - Personal history of transient ischemic attack (TIA), and cerebral infarction without residual deficits   THERAPY DIAG:  Unsteadiness on feet  Other abnormalities of gait and mobility  Difficulty in walking, not elsewhere classified  Abnormality of gait and mobility  Muscle weakness (generalized)  Rationale for Evaluation and Treatment: Rehabilitation  SUBJECTIVE:  SUBJECTIVE STATEMENT:  Patient reports sleepy but doing okay otherwise.     PERTINENT HISTORY: Pt is a 72 year old female with history of multiple strokes, migraine, "Subjective versus Mild Cognitive Impairment (word finding difficulty, short-term recall), in patient with positive family history of Alzheimer's Disease, elevated p-tau, unremarkable SNFL," moderate non-positional obstructive sleep apnea, subjective upper extremity weakness, slight ataxia, imbalance, decreased sensation to bilateral lower extremity, hyperreflexia as well as chemo radiation for endometrial cancer and recent radiation to her left breast.   PAIN:  Are you having pain? No  PRECAUTIONS: None  WEIGHT BEARING RESTRICTIONS: No  FALLS: Has patient fallen in last 6 months? Yes. Number of falls 1 fall   LIVING ENVIRONMENT:  Lives with:  Lives with DTR who drops in and drops in once in a week Lives in: House/apartment Stairs: Yes: Internal: 15 steps; on left going up and External: 5 steps; can reach both Has following equipment at home: Single point cane  PLOF: Independent  PATIENT GOALS: walking straight ( without veering), feel more comfortable walking out and about in the park, improve walking speed.   OBJECTIVE:   TODAY'S  TREATMENT:                                                                                                                                   Therex:   Circuit style workout- (Seated LE strengthening +Standing LE strengthening + gait/walk)  1st rd:   Seated LAQ with 3 sec hold using 3# AW  Standing Hip march with 3# AW (VC for slow controlled motion)  Gait in clinic around 200 feet with 3# AW  2nd rd: Seated Hip march with 3 sec hold and 3# AW Standing hip (side step up/over 1/2 foam roll) with 3# AW  Gait in clinic around 200 feet with 3# AW  3rd rd:   Seated calf raises 3# AW x 12 reps with 3 sec hold Standing donkey kick 3#  Gait in clinic around 200 feet with 3# AW  4th rd:   Sit to stand 2 sets of 10 reps (1 min rest in between)  Gait in clinic around 200 feet with 3# AW  HR= 80 bpm  5th round Seated hip flex/abd up over 1/2 foam with 3# AW Standing hip ext with 3# AW  Gait in clinic around 200 feet with 3# AW      PATIENT EDUCATION: Allow for adequate rest breaks when motor fatigue intereferes with quality work of balance training.   HOME EXERCISE PROGRAM: Access Code: [email or web address] URL: [email or web address] Date: 04/02/2022 Prepared by: Maureen Ralphs  Exercises - Tandem Stance  - 3 x weekly - 3 sets - 20-30 sec hold - Single Leg Stance  - 3 x weekly - 3-4 sets - up to 20 sec hold    Access Code: [email or web address]  URL: [email or web address]  Date: 03/27/2022  Prepared by: Tomasa Hose   Exercises -  Sit  to Stand - 1 x daily - 4 x weekly - 3 sets - 10 reps -  Standing Hip Abduction with Counter Support - 1 x daily - 4 x weekly - 3 sets - 15 reps -  Seated Heel Raise - 1 x daily - 4 x weekly - 3 sets - 15 reps -  Heel rises with counter support - 1 x daily - 4 x weekly - 3 sets - 15 reps -  Standing March with Counter Support - 1 x daily - 4 x weekly - 3 sets - 15 reps   GOALS: Goals reviewed with patient? Yes  SHORT TERM  GOALS: Target date: 04/22/2022       Patient will be independent in home exercise program to improve strength/mobility for better functional independence with ADLs. Baseline: No HEP currently  Goal status: INITIAL   LONG TERM GOALS: Target date: 06/17/2022      1.  Patient will increase FOTO score to equal to or greater than  55   to demonstrate statistically significant improvement in mobility and quality of life.  Baseline: 52 Goal status: INITIAL   2.  Patient will increase Mini Best Balance score by > 4 points to demonstrate decreased fall risk during functional activities. Baseline: 19 Goal status: INITIAL   3.   Patient will reduce timed up and go to <11 seconds to reduce fall risk and demonstrate improved transfer/gait ability. Baseline: 14.6 sec Goal status: INITIAL  4.   Patient will increase 10 meter walk test to >1.84m/s as to improve gait speed for better community ambulation and to reduce fall risk. Baseline: .89 m/s Goal status: INITIAL  5.   Patient will increase six minute walk test distance to >1630 feet and with no signs of imbalance for progression to super community ambulator. Baseline: 03/27/22: 1458ft, no device, intermitent drifting/sway/crossover Goal status: REVISED     ASSESSMENT:  CLINICAL IMPRESSION: Pt. Performed in circuit style workout program and challenged her stamina with exercises. She was able to complete 5 rounds of seated/standing activities and no specific LOB with walking with resistance today. She was ultimately fatigued yet able to complete all prescribed exercises. Pt will continue to benefit from skilled physical therapy intervention to address impairments, improve QOL, and attain therapy goals.   .    OBJECTIVE IMPAIRMENTS: Abnormal gait, decreased activity tolerance, decreased balance, decreased endurance, decreased mobility, difficulty walking, decreased ROM, decreased strength, and impaired perceived functional ability.    ACTIVITY LIMITATIONS: standing, squatting, stairs, and locomotion level  PARTICIPATION LIMITATIONS: cleaning, shopping, community activity, and yard work  PERSONAL FACTORS: Time since onset of injury/illness/exacerbation and 3+ comorbidities: ischemic stroke, cancer in remission, arthritis,   are also affecting patient's functional outcome.   REHAB POTENTIAL: Good  CLINICAL DECISION MAKING: Stable/uncomplicated  EVALUATION COMPLEXITY: Low  PLAN:  PT FREQUENCY: 1-2x/week  PT DURATION: 12 weeks  PLANNED INTERVENTIONS: Therapeutic exercises, Therapeutic activity, Neuromuscular re-education, Balance training, Gait training, Patient/Family education, Self Care, Joint mobilization, and Stair training  PLAN FOR NEXT SESSION: Continue to monitor vitals as appropriate, advance HEP with balance exercises, gait based balance, continue with instructing in  activities she can perform in the well zone prior to discharge  Lenda Kelp, PT 05/15/2022, 4:38 PM  4:38 PM, 05/15/22 Physical Therapist - [facility]  Outpatient Physical Therapy- Main Campus [phone]

## 2022-05-19 ENCOUNTER — Encounter: Payer: Medicare Other | Admitting: Speech Pathology

## 2022-05-19 ENCOUNTER — Ambulatory Visit: Payer: Medicare Other | Admitting: Physical Therapy

## 2022-05-19 DIAGNOSIS — R2681 Unsteadiness on feet: Secondary | ICD-10-CM

## 2022-05-19 DIAGNOSIS — R2689 Other abnormalities of gait and mobility: Secondary | ICD-10-CM

## 2022-05-19 DIAGNOSIS — R269 Unspecified abnormalities of gait and mobility: Secondary | ICD-10-CM | POA: Diagnosis not present

## 2022-05-19 DIAGNOSIS — R262 Difficulty in walking, not elsewhere classified: Secondary | ICD-10-CM

## 2022-05-19 DIAGNOSIS — M6281 Muscle weakness (generalized): Secondary | ICD-10-CM | POA: Diagnosis not present

## 2022-05-19 NOTE — Therapy (Signed)
OUTPATIENT PHYSICAL THERAPY NEURO TREATMENT/ Physical Therapy Progress Note   Dates of reporting period  03/24/22   to   05/19/22     Patient Name: Natalie [name] MRN: [id number] DOB:[birth year], 72 y.o., female Today's Date: 05/19/2022   PCP: Alba Cory, MD REFERRING PROVIDER: Lonell Face, MD   END OF SESSION:  PT End of Session - 05/19/22 1602     Visit Number 10    Number of Visits 24    Date for PT Re-Evaluation 06/16/22    Authorization Type Traditional Medicare with [employer] Supplemental: VL based on certification    Authorization Time Period 03/24/22-06/16/22    Progress Note Due on Visit 10    PT Start Time 1602    PT Stop Time 1644    PT Time Calculation (min) 42 min    Equipment Utilized During Treatment Gait belt    Activity Tolerance Patient tolerated treatment well;No increased pain    Behavior During Therapy WFL for tasks assessed/performed                    Past Medical History:  Diagnosis Date   Acute bronchospasm due to viral infection 2020   Due to pollen   Acute ischemic left MCA stroke 04/19/2020   a.) CTA head/neck 04/19/2020 --> nearly occlusive thrombus at bifurcation of a proximal M2 MCA branch approximately 6 mm from the MCA bifurcation. Subsequent occlusion of a proximal left M3 MCA branch   Allergy    Anemia    Angina at rest    Breast cancer, left 08/21/2021   a.) stereotactic Bx 08/21/2021 --> IMC (G1, ER/PR +, Her2/neu -)   Cataract    a.) s/p extraction with IOL placement   Depressive disorder    Diverticulosis    Dyspnea    Epistaxis    Fatigue    Hearing loss    History of 2019 novel coronavirus disease (COVID-19) 12/18/2020   History of ischemic multifocal posterior circulation stroke 02/28/2020   a.) presented to ED with increasing episodes of  transient vertical binocular diplopia; MRI 02/27/2021 --> multiple punctate acute to subacute ischemic infarcts involving the cortical/subcortical aspects of both  parieto-occipital regions   History of loop recorder    History of radiation therapy    Vagina-02/11/21-03/14/21- Dr. 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   a.) stage 1A; grade 1 (pT1a, pN0); b.) s/p TLH/BSO 12/18/2020 + adjuvant vaginal brachytherapy (30 Gy over 5 fractions between 02/11/2021 - 03/14/2021)   Vitamin D deficiency    Past Surgical History:  Procedure Laterality Date   ABDOMINAL HYSTERECTOMY  12/18/2020   ABLATION  2006   AXILLARY SENTINEL NODE BIOPSY Left 09/25/2021   Procedure: AXILLARY SENTINEL NODE BIOPSY;  Surgeon: Earline Mayotte, MD;  Location: [facility] ORS;  Service: General;  Laterality: Left;   BREAST BIOPSY Left 08/21/2021   Stereo Bx, X-clip; path --> IMC (G1, ER/PR +, Her2/neu -)   BREAST LUMPECTOMY WITH NEEDLE LOCALIZATION Left 09/25/2021   Procedure: BREAST LUMPECTOMY  WITH NEEDLE LOCALIZATION;  Surgeon: Earline Mayotte, MD;  Location: [facility];  Service: General;  Laterality: Left;   BUBBLE STUDY  04/23/2020   Procedure: BUBBLE STUDY;  Surgeon: Laurey Morale, MD;  Location: [facility] ENDOSCOPY;  Service: Cardiovascular;;   CATARACT EXTRACTION W/ INTRAOCULAR LENS IMPLANT Bilateral    COLONOSCOPY     COLONOSCOPY WITH PROPOFOL N/A 12/22/2018   Procedure: COLONOSCOPY WITH PROPOFOL;  Surgeon: Pasty Spillers, MD;  Location: [facility] ENDOSCOPY;  Service: Endoscopy;  Laterality: N/A;   EYE SURGERY  Spring 2018   Cataracts both eyes   HYSTEROSCOPY WITH D & C N/A 11/01/2020   Procedure: DILATATION AND CURETTAGE /HYSTEROSCOPY WITH MYOSURE;  Surgeon: Carver Fila, MD;  Location: [facility];  Service: Gynecology;  Laterality: N/A;   LAPAROTOMY  12/18/2020   Procedure: LAPAROTOMY;  Surgeon: Carver Fila, MD;  Location: [facility];  Service: Gynecology;;   LOOP RECORDER INSERTION N/A 04/23/2020   Procedure: LOOP RECORDER INSERTION;  Surgeon: Marinus Maw, MD;  Location: [facility] INVASIVE CV LAB;  Service: Cardiovascular;  Laterality: N/A;   ROBOTIC ASSISTED TOTAL HYSTERECTOMY WITH BILATERAL SALPINGO OOPHERECTOMY Bilateral 12/18/2020   Procedure: XI ROBOTIC ASSISTED TOTAL HYSTERECTOMY WITH BILATERAL SALPINGO OOPHORECTOMY;  Surgeon: Carver Fila, MD;  Location: [facility];  Service: Gynecology;  Laterality: Bilateral;   SENTINEL NODE BIOPSY N/A 12/18/2020   Procedure: SENTINEL NODE BIOPSY;  Surgeon: Carver Fila, MD;  Location: [facility];  Service: Gynecology;  Laterality: N/A;   TEE WITHOUT CARDIOVERSION N/A 04/23/2020   Procedure: TRANSESOPHAGEAL ECHOCARDIOGRAM (TEE);  Surgeon: Laurey Morale, MD;  Location: [facility] ENDOSCOPY;  Service: Cardiovascular;  Laterality: N/A;   TONSILLECTOMY     Patient Active Problem List   Diagnosis Date Noted   Cerebrovascular accident (CVA) 12/05/2021   Acute CVA (cerebrovascular accident) 12/04/2021   Trigger finger, right ring finger 09/10/2021   Dupuytren's contracture of both hands 09/10/2021   Vitamin D deficiency 09/10/2021   Malignant neoplasm of left breast in female, estrogen receptor positive 09/01/2021   Dyslipidemia 05/16/2021   Major depression, recurrent, chronic 03/13/2021   Mild protein-calorie malnutrition 03/13/2021   Endometrial cancer 12/26/2020   Postoperative anemia due to acute blood loss 12/19/2020   History of CVA in adulthood 04/19/2020   History of ischemic multifocal posterior circulation stroke 02/29/2020   Difficulty hearing 06/28/2015   Arthritis, degenerative 06/28/2015   Paresthesia 06/28/2015   Purpura, nonthrombopenic 06/28/2015   Adult hypothyroidism 08/09/2014    Migraine without aura and without status migrainosus, not intractable 09/19/2008   Moderate major depression 09/07/2006   OP (osteoporosis) 09/07/2006    ONSET DATE: 04/19/20  REFERRING DIAG: O11.57 (ICD-10-CM) - Personal history of transient ischemic attack (TIA), and cerebral infarction without residual deficits   THERAPY DIAG:  No diagnosis found.  Rationale for Evaluation and Treatment: Rehabilitation  SUBJECTIVE:  SUBJECTIVE STATEMENT:  Patient reports she is doing well with no significant changes since previous session   PERTINENT HISTORY: Pt is a 72 year old female with history of multiple strokes, migraine, "Subjective versus Mild Cognitive Impairment (word finding difficulty, short-term recall), in patient with positive family history of Alzheimer's Disease, elevated p-tau, unremarkable SNFL," moderate non-positional obstructive sleep apnea, subjective upper extremity weakness, slight ataxia, imbalance, decreased sensation to bilateral lower extremity, hyperreflexia as well as chemo radiation for endometrial cancer and recent radiation to her left breast.   PAIN:  Are you having pain? No  PRECAUTIONS: None  WEIGHT BEARING RESTRICTIONS: No  FALLS: Has patient fallen in last 6 months? Yes. Number of falls 1 fall   LIVING ENVIRONMENT:  Lives with:  Lives with DTR who drops in and drops in once in a week Lives in: House/apartment Stairs: Yes: Internal: 15 steps; on left going up and External: 5 steps; can reach both Has following equipment at home: Single point cane  PLOF: Independent  PATIENT GOALS: walking straight ( without veering), feel more comfortable walking out and about in the park, improve walking speed.   OBJECTIVE:   TODAY'S TREATMENT:      05/19/22                                                                                                                           Physical therapy treatment session today consisted of completing assessment of goals and administration of testing as demonstrated and documented in flow sheet, treatment, and goals section of this note. Addition treatments may be found below.      [facility] PT Assessment - 05/19/22 0001       Mini-BESTest   Sit To Stand Normal: Comes to stand without use of hands and stabilizes independently.    Rise to Toes Normal: Stable for 3 s with maximum height.    Stand on one leg (left) Moderate: < 20 s    Stand on one leg (right) Normal: 20 s.    Stand on one leg - lowest score 1    Compensatory Stepping Correction - Forward Normal: Recovers independently with a single, large step (second realignement is allowed).    Compensatory Stepping Correction - Backward Normal: Recovers independently with a single, large step    Compensatory Stepping Correction - Left Lateral Normal: Recovers independently with 1 step (crossover or lateral OK)    Compensatory Stepping Correction - Right Lateral Normal: Recovers independently with 1 step (crossover or lateral OK)    Stepping Corredtion Lateral - lowest score 2    Stance - Feet together, eyes open, firm surface  Normal: 30s    Stance - Feet together, eyes closed, foam surface  Normal: 30s    Incline - Eyes Closed Moderate: Stands independently < 30s OR aligns with surface    Change in Gait Speed Normal: Significantly changes walkling speed without imbalance    Walk with head turns - Horizontal Moderate: performs head  turns with reduction in gait speed.    Walk with pivot turns Normal: Turns with feet close FAST (< 3 steps) with good balance.    Step over obstacles Moderate: Steps over box but touches box OR displays cautious behavior by slowing gait.    Timed UP & GO with Dual Task Moderate: Dual Task affects either counting OR walking (>10%) when compared to the  TUG without Dual Task.    Mini-BEST total score 23                 PATIENT EDUCATION: Allow for adequate rest breaks when motor fatigue intereferes with quality work of balance training.   HOME EXERCISE PROGRAM: Access Code: [email or web address] URL: [email or web address] Date: 04/02/2022 Prepared by: Maureen Ralphs  Exercises - Tandem Stance  - 3 x weekly - 3 sets - 20-30 sec hold - Single Leg Stance  - 3 x weekly - 3-4 sets - up to 20 sec hold    Access Code: [email or web address]  URL: [email or web address]  Date: 03/27/2022  Prepared by: Tomasa Hose   Exercises -  Sit to Stand - 1 x daily - 4 x weekly - 3 sets - 10 reps -  Standing Hip Abduction with Counter Support - 1 x daily - 4 x weekly - 3 sets - 15 reps -  Seated Heel Raise - 1 x daily - 4 x weekly - 3 sets - 15 reps -  Heel rises with counter support - 1 x daily - 4 x weekly - 3 sets - 15 reps -  Standing March with Counter Support - 1 x daily - 4 x weekly - 3 sets - 15 reps   GOALS: Goals reviewed with patient? Yes  SHORT TERM GOALS: Target date: 04/22/2022       Patient will be independent in home exercise program to improve strength/mobility for better functional independence with ADLs. Baseline: No HEP currently 1/15:Pt performing every other day or every 2 days, pt still progressing with gym based program for discharge and is not comfortable with program at this point  Goal status: Partially MET   LONG TERM GOALS: Target date: 06/17/2022      1.  Patient will increase FOTO score to equal to or greater than  55   to demonstrate statistically significant improvement in mobility and quality of life.  Baseline: 52 4/15: 58%  Goal status: MET   2.  Patient will increase Mini Best Balance score by > 4 points to demonstrate decreased fall risk during functional activities. Baseline: 19 05/19/22: 23 Goal status: MET   3.   Patient will reduce timed up and go to <11 seconds to reduce  fall risk and demonstrate improved transfer/gait ability. Baseline: 14.6 sec 05/19/22: 10.6 Goal status: MET  4.   Patient will increase 10 meter walk test to >1.61m/s as to improve gait speed for better community ambulation and to reduce fall risk. Baseline: .89 m/s 05/19/22: 1.21 m/s Goal status: MET  5.   Patient will increase six minute walk test distance to >1630 feet and with no signs of imbalance for progression to super community ambulator. Baseline: 03/27/22: 1473ft, no device, intermitent drifting/sway/crossover 05/19/22:1360 ft Goal status: ONGOING     ASSESSMENT:  CLINICAL IMPRESSION: Patient presents to physical therapy for progress note this date.  Patient has met nearly all of her therapy goals at this time showing significant improvement in her balance, her reaction to losses of balance, and her ability to navigate various environments.  Patient  also shows improved testing on various fall screens showing further decrease in risk of falls.  In addition patient makes clinically significant improvement in many best balance test showing improvement in overall balance.  Patient still has impairments with prolonged ambulation which she would like to improve including the 6-minute walk test but patient is ambulating a speed that is safe for community ambulator's at this time.  Patient is still progressing toward her home exercise program goal and will continue to work toward this in the well zone in future sessions.  In addition patient continues to have difficulty with small but important balance tasks such as ambulating standing on inclines which will be important to target in future sessions. Patient's condition has the potential to improve in response to therapy. Maximum improvement is yet to be obtained. The anticipated improvement is attainable and reasonable in a generally predictable time.  Pt will continue to benefit from skilled physical therapy intervention to address impairments,  improve QOL, and attain therapy goals.   .    OBJECTIVE IMPAIRMENTS: Abnormal gait, decreased activity tolerance, decreased balance, decreased endurance, decreased mobility, difficulty walking, decreased ROM, decreased strength, and impaired perceived functional ability.   ACTIVITY LIMITATIONS: standing, squatting, stairs, and locomotion level  PARTICIPATION LIMITATIONS: cleaning, shopping, community activity, and yard work  PERSONAL FACTORS: Time since onset of injury/illness/exacerbation and 3+ comorbidities: ischemic stroke, cancer in remission, arthritis,   are also affecting patient's functional outcome.   REHAB POTENTIAL: Good  CLINICAL DECISION MAKING: Stable/uncomplicated  EVALUATION COMPLEXITY: Low  PLAN:  PT FREQUENCY: 1-2x/week  PT DURATION: 12 weeks  PLANNED INTERVENTIONS: Therapeutic exercises, Therapeutic activity, Neuromuscular re-education, Balance training, Gait training, Patient/Family education, Self Care, Joint mobilization, and Stair training  PLAN FOR NEXT SESSION: Continue to monitor vitals as appropriate, advance HEP with balance exercises, gait based balance, continue with instructing in  activities she can perform in the well zone prior to discharge  Norman Herrlich, PT 05/19/2022, 5:15 PM  5:15 PM, 05/19/22 Physical Therapist - [facility]  Outpatient Physical Therapy- Main Campus [phone]

## 2022-05-22 ENCOUNTER — Ambulatory Visit: Payer: Medicare Other

## 2022-05-22 ENCOUNTER — Encounter: Payer: Medicare Other | Admitting: Speech Pathology

## 2022-05-22 DIAGNOSIS — M6281 Muscle weakness (generalized): Secondary | ICD-10-CM

## 2022-05-22 DIAGNOSIS — R262 Difficulty in walking, not elsewhere classified: Secondary | ICD-10-CM | POA: Diagnosis not present

## 2022-05-22 DIAGNOSIS — R2689 Other abnormalities of gait and mobility: Secondary | ICD-10-CM | POA: Diagnosis not present

## 2022-05-22 DIAGNOSIS — R2681 Unsteadiness on feet: Secondary | ICD-10-CM

## 2022-05-22 DIAGNOSIS — R269 Unspecified abnormalities of gait and mobility: Secondary | ICD-10-CM | POA: Diagnosis not present

## 2022-05-22 NOTE — Therapy (Signed)
OUTPATIENT PHYSICAL THERAPY NEURO TREATMENT     Patient Name: Natalie [name] MRN: [id number] DOB:1950/05/08, 72 y.o., female Today's Date: 05/22/2022   PCP: Alba Cory, MD REFERRING PROVIDER: Lonell Face, MD   END OF SESSION:  PT End of Session - 05/22/22 1528     Visit Number 11    Number of Visits 24    Date for PT Re-Evaluation 06/16/22    Authorization Type Traditional Medicare with [employer] Supplemental: VL based on certification    Authorization Time Period 03/24/22-06/16/22    Progress Note Due on Visit 10    PT Start Time 1444    PT Stop Time 1528    PT Time Calculation (min) 44 min    Equipment Utilized During Treatment Gait belt    Activity Tolerance Patient tolerated treatment well;No increased pain    Behavior During Therapy WFL for tasks assessed/performed                     Past Medical History:  Diagnosis Date   Acute bronchospasm due to viral infection 2020   Due to pollen   Acute ischemic left MCA stroke 04/19/2020   a.) CTA head/neck 04/19/2020 --> nearly occlusive thrombus at bifurcation of a proximal M2 MCA branch approximately 6 mm from the MCA bifurcation. Subsequent occlusion of a proximal left M3 MCA branch   Allergy    Anemia    Angina at rest    Breast cancer, left 08/21/2021   a.) stereotactic Bx 08/21/2021 --> IMC (G1, ER/PR +, Her2/neu -)   Cataract    a.) s/p extraction with IOL placement   Depressive disorder    Diverticulosis    Dyspnea    Epistaxis    Fatigue    Hearing loss    History of 2019 novel coronavirus disease (COVID-19) 12/18/2020   History of ischemic multifocal posterior circulation stroke 02/28/2020   a.) presented to ED with increasing episodes of  transient vertical binocular diplopia; MRI 02/27/2021 --> multiple punctate acute to subacute ischemic infarcts involving the cortical/subcortical aspects of both parieto-occipital regions   History of loop recorder    History of radiation therapy     Vagina-02/11/21-03/14/21- Dr. 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   a.) stage 1A; grade 1 (pT1a, pN0); b.) s/p TLH/BSO 12/18/2020 + adjuvant vaginal brachytherapy (30 Gy over 5 fractions between 02/11/2021 - 03/14/2021)   Vitamin D deficiency    Past Surgical History:  Procedure Laterality Date   ABDOMINAL HYSTERECTOMY  12/18/2020   ABLATION  2006   AXILLARY SENTINEL NODE BIOPSY Left 09/25/2021   Procedure: AXILLARY SENTINEL NODE BIOPSY;  Surgeon: Earline Mayotte, MD;  Location: [facility]S;  Service: General;  Laterality: Left;   BREAST BIOPSY Left 08/21/2021   Stereo Bx, X-clip; path --> IMC (G1, ER/PR +, Her2/neu -)   BREAST LUMPECTOMY WITH NEEDLE LOCALIZATION Left 09/25/2021   Procedure: BREAST LUMPECTOMY WITH NEEDLE LOCALIZATION;  Surgeon: Earline Mayotte, MD;  Location: [facility];  Service: General;  Laterality: Left;   BUBBLE STUDY  04/23/2020   Procedure: BUBBLE STUDY;  Surgeon: Laurey Morale, MD;  Location: [facility] ENDOSCOPY;  Service: Cardiovascular;;   CATARACT EXTRACTION W/ INTRAOCULAR LENS IMPLANT Bilateral    COLONOSCOPY     COLONOSCOPY WITH PROPOFOL N/A 12/22/2018   Procedure: COLONOSCOPY WITH PROPOFOL;  Surgeon: Pasty Spillers, MD;  Location: [facility] ENDOSCOPY;  Service: Endoscopy;  Laterality: N/A;   EYE SURGERY  Spring 2018   Cataracts both eyes   HYSTEROSCOPY WITH D & C N/A 11/01/2020   Procedure: DILATATION AND CURETTAGE /HYSTEROSCOPY WITH MYOSURE;  Surgeon: Carver Fila, MD;  Location: [facility];  Service: Gynecology;  Laterality: N/A;   LAPAROTOMY   12/18/2020   Procedure: LAPAROTOMY;  Surgeon: Carver Fila, MD;  Location: [facility];  Service: Gynecology;;   LOOP RECORDER INSERTION N/A 04/23/2020   Procedure: LOOP RECORDER INSERTION;  Surgeon: Marinus Maw, MD;  Location: [facility] INVASIVE CV LAB;  Service: Cardiovascular;  Laterality: N/A;   ROBOTIC ASSISTED TOTAL HYSTERECTOMY WITH BILATERAL SALPINGO OOPHERECTOMY Bilateral 12/18/2020   Procedure: XI ROBOTIC ASSISTED TOTAL HYSTERECTOMY WITH BILATERAL SALPINGO OOPHORECTOMY;  Surgeon: Carver Fila, MD;  Location: [facility];  Service: Gynecology;  Laterality: Bilateral;   SENTINEL NODE BIOPSY N/A 12/18/2020   Procedure: SENTINEL NODE BIOPSY;  Surgeon: Carver Fila, MD;  Location: [facility];  Service: Gynecology;  Laterality: N/A;   TEE WITHOUT CARDIOVERSION N/A 04/23/2020   Procedure: TRANSESOPHAGEAL ECHOCARDIOGRAM (TEE);  Surgeon: Laurey Morale, MD;  Location: [facility] ENDOSCOPY;  Service: Cardiovascular;  Laterality: N/A;   TONSILLECTOMY     Patient Active Problem List   Diagnosis Date Noted   Cerebrovascular accident (CVA) 12/05/2021   Acute CVA (cerebrovascular accident) 12/04/2021   Trigger finger, right ring finger 09/10/2021   Dupuytren's contracture of both hands 09/10/2021   Vitamin D deficiency 09/10/2021   Malignant neoplasm of left breast in female, estrogen receptor positive 09/01/2021   Dyslipidemia 05/16/2021   Major depression, recurrent, chronic 03/13/2021   Mild protein-calorie malnutrition 03/13/2021   Endometrial cancer 12/26/2020   Postoperative anemia due to acute blood loss 12/19/2020   History of CVA in adulthood 04/19/2020   History of ischemic multifocal posterior circulation stroke 02/29/2020   Difficulty hearing 06/28/2015   Arthritis, degenerative 06/28/2015   Paresthesia 06/28/2015   Purpura, nonthrombopenic 06/28/2015   Adult hypothyroidism 08/09/2014   Migraine without aura and without status migrainosus, not intractable 09/19/2008   Moderate  major depression 09/07/2006   OP (osteoporosis) 09/07/2006    ONSET DATE: 04/19/20  REFERRING DIAG: S81.10 (ICD-10-CM) - Personal history of transient ischemic attack (TIA), and cerebral infarction without residual deficits   THERAPY DIAG:  Muscle weakness (generalized)  Unsteadiness on feet  Rationale for Evaluation and Treatment: Rehabilitation  SUBJECTIVE:  SUBJECTIVE STATEMENT:  Pt doing well today, and is tired from an active day. Pt reports she exercised 15 minutes at gym walking.    PERTINENT HISTORY: Pt is a 72 year old female with history of multiple strokes, migraine, "Subjective versus Mild Cognitive Impairment (word finding difficulty, short-term recall), in patient with positive family history of Alzheimer's Disease, elevated p-tau, unremarkable SNFL," moderate non-positional obstructive sleep apnea, subjective upper extremity weakness, slight ataxia, imbalance, decreased sensation to bilateral lower extremity, hyperreflexia as well as chemo radiation for endometrial cancer and recent radiation to her left breast.   PAIN:  Are you having pain? No  PRECAUTIONS: None  WEIGHT BEARING RESTRICTIONS: No  FALLS: Has patient fallen in last 6 months? Yes. Number of falls 1 fall   LIVING ENVIRONMENT:  Lives with:  Lives with DTR who drops in and drops in once in a week Lives in: House/apartment Stairs: Yes: Internal: 15 steps; on left going up and External: 5 steps; can reach both Has following equipment at home: Single point cane  PLOF: Independent  PATIENT GOALS: walking straight ( without veering), feel more comfortable walking out and about in the park, improve walking speed.   OBJECTIVE:   TODAY'S TREATMENT:      05/22/22                                                                         TE: Instruction in use of [employer] exercise machines- Leg press @ levels 3-4, 2x10 bilat LE -reviewed single leg press Leg curls @ levels 2-3 1x10 bilat LE at each level. Leg extensive at level 1 2x10 bilat LE --Pt then performed single leg LLE leg ext 10x @ level 1                                                   NMR: Gait belt donned and CGA provided unless specified otherwise  [employer]- NBOS EO and EC 60 sec of each NBOS EO with vertical, horizontal and diagonal head turns  (R>L, L>) 10x for each condition Static stand on small incline EO x 30 sec, EC x 60 sec. Challenging, pt requires intermittent UE support Static stand in DF x multiple bouts 5-10 sec with intermittent UE support DF/penguin walks 3x length of // bars. Intermittent UE support Sustained heel raise 60 sec bilat Cross over stepping 4x length of // bars. Intermittent UE support  PATIENT EDUCATION: Allow for adequate rest breaks when motor fatigue intereferes with quality work of balance training.   HOME EXERCISE PROGRAM: Access Code: [email or web address] URL: [email or web address] Date: 04/02/2022 Prepared by: Maureen Ralphs  Exercises - Tandem Stance  - 3 x weekly - 3 sets - 20-30 sec hold - Single Leg Stance  - 3 x weekly - 3-4 sets - up to 20 sec hold    Access Code: [email or web address]  URL: [email or web address]  Date: 03/27/2022  Prepared by: Tomasa Hose   Exercises -  Sit to Stand - 1 x daily - 4 x weekly - 3 sets - 10 reps -  Standing Hip Abduction with Counter Support -  1 x daily - 4 x weekly - 3 sets - 15 reps -  Seated Heel Raise - 1 x daily - 4 x weekly - 3 sets - 15 reps -  Heel rises with counter support - 1 x daily - 4 x weekly - 3 sets - 15 reps -  Standing March with Counter Support - 1 x daily - 4 x weekly - 3 sets - 15 reps   GOALS: Goals reviewed with patient? Yes  SHORT TERM GOALS: Target date: 04/22/2022       Patient will be independent in home exercise  program to improve strength/mobility for better functional independence with ADLs. Baseline: No HEP currently 1/15:Pt performing every other day or every 2 days, pt still progressing with gym based program for discharge and is not comfortable with program at this point  Goal status: Partially MET   LONG TERM GOALS: Target date: 06/17/2022      1.  Patient will increase FOTO score to equal to or greater than  55   to demonstrate statistically significant improvement in mobility and quality of life.  Baseline: 52 4/15: 58%  Goal status: MET   2.  Patient will increase Mini Best Balance score by > 4 points to demonstrate decreased fall risk during functional activities. Baseline: 19 05/19/22: 23 Goal status: MET   3.   Patient will reduce timed up and go to <11 seconds to reduce fall risk and demonstrate improved transfer/gait ability. Baseline: 14.6 sec 05/19/22: 10.6 Goal status: MET  4.   Patient will increase 10 meter walk test to >1.54m/s as to improve gait speed for better community ambulation and to reduce fall risk. Baseline: .89 m/s 05/19/22: 1.21 m/s Goal status: MET  5.   Patient will increase six minute walk test distance to >1630 feet and with no signs of imbalance for progression to super community ambulator. Baseline: 03/27/22: 1477ft, no device, intermitent drifting/sway/crossover 05/19/22:1360 ft Goal status: ONGOING     ASSESSMENT:  CLINICAL IMPRESSION: PT reviewed [employer] LE strengthening machines to further promote independence with HEP and return to activity. Pt tolerated these interventions well without pain, significant fatigue and demonstrated correct technique throughout. The pt had the most difficulty with balance exercises that placed her in a dorsiflexed position and with cross-over stepping.  Pt will continue to benefit from skilled physical therapy intervention to address impairments, improve QOL, and attain therapy goals.      OBJECTIVE IMPAIRMENTS:  Abnormal gait, decreased activity tolerance, decreased balance, decreased endurance, decreased mobility, difficulty walking, decreased ROM, decreased strength, and impaired perceived functional ability.   ACTIVITY LIMITATIONS: standing, squatting, stairs, and locomotion level  PARTICIPATION LIMITATIONS: cleaning, shopping, community activity, and yard work  PERSONAL FACTORS: Time since onset of injury/illness/exacerbation and 3+ comorbidities: ischemic stroke, cancer in remission, arthritis,   are also affecting patient's functional outcome.   REHAB POTENTIAL: Good  CLINICAL DECISION MAKING: Stable/uncomplicated  EVALUATION COMPLEXITY: Low  PLAN:  PT FREQUENCY: 1-2x/week  PT DURATION: 12 weeks  PLANNED INTERVENTIONS: Therapeutic exercises, Therapeutic activity, Neuromuscular re-education, Balance training, Gait training, Patient/Family education, Self Care, Joint mobilization, and Stair training  PLAN FOR NEXT SESSION: Continue to monitor vitals as appropriate, advance HEP with balance exercises, gait based balance, continue with instructing in  activities she can perform in the well zone prior to discharge  Baird Kay, PT 05/22/2022, 3:45 PM  3:45 PM, 05/22/22 Physical Therapist - [facility]  Outpatient Physical Therapy- Main [facility] [phone]

## 2022-05-26 ENCOUNTER — Ambulatory Visit: Payer: Medicare Other | Admitting: Physical Therapy

## 2022-05-26 ENCOUNTER — Encounter: Payer: Medicare Other | Admitting: Speech Pathology

## 2022-05-26 DIAGNOSIS — R413 Other amnesia: Secondary | ICD-10-CM | POA: Diagnosis not present

## 2022-05-27 ENCOUNTER — Ambulatory Visit: Payer: Medicare Other | Admitting: Physical Therapy

## 2022-05-27 NOTE — Therapy (Deleted)
OUTPATIENT PHYSICAL THERAPY NEURO TREATMENT     Patient Name: Natalie [name] MRN: [id number] DOB:15-Dec-1950, 72 y.o., female Today's Date: 05/27/2022   PCP: Alba Cory, MD REFERRING PROVIDER: Lonell Face, MD   END OF SESSION:            Past Medical History:  Diagnosis Date   Acute bronchospasm due to viral infection 2020   Due to pollen   Acute ischemic left MCA stroke 04/19/2020   a.) CTA head/neck 04/19/2020 --> nearly occlusive thrombus at bifurcation of a proximal M2 MCA branch approximately 6 mm from the MCA bifurcation. Subsequent occlusion of a proximal left M3 MCA branch   Allergy    Anemia    Angina at rest    Breast cancer, left 08/21/2021   a.) stereotactic Bx 08/21/2021 --> IMC (G1, ER/PR +, Her2/neu -)   Cataract    a.) s/p extraction with IOL placement   Depressive disorder    Diverticulosis    Dyspnea    Epistaxis    Fatigue    Hearing loss    History of 2019 novel coronavirus disease (COVID-19) 12/18/2020   History of ischemic multifocal posterior circulation stroke 02/28/2020   a.) presented to ED with increasing episodes of  transient vertical binocular diplopia; MRI 02/27/2021 --> multiple punctate acute to subacute ischemic infarcts involving the cortical/subcortical aspects of both parieto-occipital regions   History of loop recorder    History of radiation therapy    Vagina-02/11/21-03/14/21- Dr. 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   a.) stage 1A; grade 1 (pT1a, pN0); b.) s/p TLH/BSO 12/18/2020 + adjuvant vaginal  brachytherapy (30 Gy over 5 fractions between 02/11/2021 - 03/14/2021)   Vitamin D deficiency    Past Surgical History:  Procedure Laterality Date   ABDOMINAL HYSTERECTOMY  12/18/2020   ABLATION  2006   AXILLARY SENTINEL NODE BIOPSY Left 09/25/2021   Procedure: AXILLARY SENTINEL NODE BIOPSY;  Surgeon: Earline Mayotte, MD;  Location: [facility] ORS;  Service: General;  Laterality: Left;   BREAST BIOPSY Left 08/21/2021   Stereo Bx, X-clip; path --> IMC (G1, ER/PR +, Her2/neu -)   BREAST LUMPECTOMY WITH NEEDLE LOCALIZATION Left 09/25/2021   Procedure: BREAST LUMPECTOMY WITH NEEDLE LOCALIZATION;  Surgeon: Earline Mayotte, MD;  Location: [facility];  Service: General;  Laterality: Left;   BUBBLE STUDY  04/23/2020   Procedure: BUBBLE STUDY;  Surgeon: Laurey Morale, MD;  Location: [facility] ENDOSCOPY;  Service: Cardiovascular;;   CATARACT EXTRACTION W/ INTRAOCULAR LENS IMPLANT Bilateral    COLONOSCOPY     COLONOSCOPY WITH PROPOFOL N/A 12/22/2018   Procedure: COLONOSCOPY WITH PROPOFOL;  Surgeon: Pasty Spillers, MD;  Location: [facility] ENDOSCOPY;  Service: Endoscopy;  Laterality: N/A;   EYE SURGERY  Spring 2018   Cataracts both eyes   HYSTEROSCOPY WITH D & C N/A 11/01/2020   Procedure: DILATATION AND CURETTAGE /HYSTEROSCOPY WITH MYOSURE;  Surgeon: Carver Fila, MD;  Location: [facility];  Service: Gynecology;  Laterality: N/A;   LAPAROTOMY  12/18/2020  Procedure: LAPAROTOMY;  Surgeon: Carver Fila, MD;  Location: [facility];  Service: Gynecology;;   LOOP RECORDER INSERTION N/A 04/23/2020   Procedure: LOOP RECORDER INSERTION;  Surgeon: Marinus Maw, MD;  Location: [facility] INVASIVE CV LAB;  Service: Cardiovascular;  Laterality: N/A;   ROBOTIC ASSISTED TOTAL HYSTERECTOMY WITH BILATERAL SALPINGO OOPHERECTOMY Bilateral 12/18/2020   Procedure: XI ROBOTIC ASSISTED TOTAL HYSTERECTOMY WITH BILATERAL SALPINGO OOPHORECTOMY;  Surgeon: Carver Fila, MD;  Location: [facility];  Service: Gynecology;   Laterality: Bilateral;   SENTINEL NODE BIOPSY N/A 12/18/2020   Procedure: SENTINEL NODE BIOPSY;  Surgeon: Carver Fila, MD;  Location: [facility];  Service: Gynecology;  Laterality: N/A;   TEE WITHOUT CARDIOVERSION N/A 04/23/2020   Procedure: TRANSESOPHAGEAL ECHOCARDIOGRAM (TEE);  Surgeon: Laurey Morale, MD;  Location: [facility] ENDOSCOPY;  Service: Cardiovascular;  Laterality: N/A;   TONSILLECTOMY     Patient Active Problem List   Diagnosis Date Noted   Cerebrovascular accident (CVA) 12/05/2021   Acute CVA (cerebrovascular accident) 12/04/2021   Trigger finger, right ring finger 09/10/2021   Dupuytren's contracture of both hands 09/10/2021   Vitamin D deficiency 09/10/2021   Malignant neoplasm of left breast in female, estrogen receptor positive 09/01/2021   Dyslipidemia 05/16/2021   Major depression, recurrent, chronic 03/13/2021   Mild protein-calorie malnutrition 03/13/2021   Endometrial cancer 12/26/2020   Postoperative anemia due to acute blood loss 12/19/2020   History of CVA in adulthood 04/19/2020   History of ischemic multifocal posterior circulation stroke 02/29/2020   Difficulty hearing 06/28/2015   Arthritis, degenerative 06/28/2015   Paresthesia 06/28/2015   Purpura, nonthrombopenic 06/28/2015   Adult hypothyroidism 08/09/2014   Migraine without aura and without status migrainosus, not intractable 09/19/2008   Moderate major depression 09/07/2006   OP (osteoporosis) 09/07/2006    ONSET DATE: 04/19/20  REFERRING DIAG: Z61.09 (ICD-10-CM) - Personal history of transient ischemic attack (TIA), and cerebral infarction without residual deficits   THERAPY DIAG:  Muscle weakness (generalized)  Unsteadiness on feet  Other abnormalities of gait and mobility  Difficulty in walking, not elsewhere classified  Abnormality of gait and mobility  Rationale for Evaluation and Treatment: Rehabilitation  SUBJECTIVE:                                                                                                                                                                                              SUBJECTIVE STATEMENT:  Pt doing well today, and is tired from an active day. Pt reports she exercised 15 minutes at gym walking.    PERTINENT HISTORY: Pt is a 72 year old female with history of multiple  strokes, migraine, "Subjective versus Mild Cognitive Impairment (word finding difficulty, short-term recall), in patient with positive family history of Alzheimer's Disease, elevated p-tau, unremarkable SNFL," moderate non-positional obstructive sleep apnea, subjective upper extremity weakness, slight ataxia, imbalance, decreased sensation to bilateral lower extremity, hyperreflexia as well as chemo radiation for endometrial cancer and recent radiation to her left breast.   PAIN:  Are you having pain? No  PRECAUTIONS: None  WEIGHT BEARING RESTRICTIONS: No  FALLS: Has patient fallen in last 6 months? Yes. Number of falls 1 fall   LIVING ENVIRONMENT:  Lives with:  Lives with DTR who drops in and drops in once in a week Lives in: House/apartment Stairs: Yes: Internal: 15 steps; on left going up and External: 5 steps; can reach both Has following equipment at home: Single point cane  PLOF: Independent  PATIENT GOALS: walking straight ( without veering), feel more comfortable walking out and about in the park, improve walking speed.   OBJECTIVE:   TODAY'S TREATMENT:      05/27/22                                                                        TE: Instruction in use of [employer] exercise machines- Leg press @ levels 3-4, 2x10 bilat LE -reviewed single leg press Leg curls @ levels 2-3 1x10 bilat LE at each level. Leg extensive at level 1 2x10 bilat LE --Pt then performed single leg LLE leg ext 10x @ level 1                                                   NMR: Gait belt donned and CGA provided unless specified otherwise  [employer]- NBOS EO and EC 60 sec  of each NBOS EO with vertical, horizontal and diagonal head turns  (R>L, L>) 10x for each condition Static stand on small incline EO x 30 sec, EC x 60 sec. Challenging, pt requires intermittent UE support Static stand in DF x multiple bouts 5-10 sec with intermittent UE support DF/penguin walks 3x length of // bars. Intermittent UE support Sustained heel raise 60 sec bilat Cross over stepping 4x length of // bars. Intermittent UE support  PATIENT EDUCATION: Allow for adequate rest breaks when motor fatigue intereferes with quality work of balance training.   HOME EXERCISE PROGRAM: Access Code: [email or web address] URL: [email or web address] Date: 04/02/2022 Prepared by: Maureen Ralphs  Exercises - Tandem Stance  - 3 x weekly - 3 sets - 20-30 sec hold - Single Leg Stance  - 3 x weekly - 3-4 sets - up to 20 sec hold    Access Code: [email or web address]  URL: [email or web address]  Date: 03/27/2022  Prepared by: Tomasa Hose   Exercises -  Sit to Stand - 1 x daily - 4 x weekly - 3 sets - 10 reps -  Standing Hip Abduction with Counter Support - 1 x daily - 4 x weekly - 3 sets - 15 reps -  Seated Heel Raise - 1 x daily - 4 x weekly - 3 sets - 15 reps -  Heel rises with counter support -  1 x daily - 4 x weekly - 3 sets - 15 reps -  Standing March with Counter Support - 1 x daily - 4 x weekly - 3 sets - 15 reps   GOALS: Goals reviewed with patient? Yes  SHORT TERM GOALS: Target date: 04/22/2022       Patient will be independent in home exercise program to improve strength/mobility for better functional independence with ADLs. Baseline: No HEP currently 1/15:Pt performing every other day or every 2 days, pt still progressing with gym based program for discharge and is not comfortable with program at this point  Goal status: Partially MET   LONG TERM GOALS: Target date: 06/17/2022      1.  Patient will increase FOTO score to equal to or greater than  55   to  demonstrate statistically significant improvement in mobility and quality of life.  Baseline: 52 4/15: 58%  Goal status: MET   2.  Patient will increase Mini Best Balance score by > 4 points to demonstrate decreased fall risk during functional activities. Baseline: 19 05/19/22: 23 Goal status: MET   3.   Patient will reduce timed up and go to <11 seconds to reduce fall risk and demonstrate improved transfer/gait ability. Baseline: 14.6 sec 05/19/22: 10.6 Goal status: MET  4.   Patient will increase 10 meter walk test to >1.79m/s as to improve gait speed for better community ambulation and to reduce fall risk. Baseline: .89 m/s 05/19/22: 1.21 m/s Goal status: MET  5.   Patient will increase six minute walk test distance to >1630 feet and with no signs of imbalance for progression to super community ambulator. Baseline: 03/27/22: 1440ft, no device, intermitent drifting/sway/crossover 05/19/22:1360 ft Goal status: ONGOING     ASSESSMENT:  CLINICAL IMPRESSION: PT reviewed [employer] LE strengthening machines to further promote independence with HEP and return to activity. Pt tolerated these interventions well without pain, significant fatigue and demonstrated correct technique throughout. The pt had the most difficulty with balance exercises that placed her in a dorsiflexed position and with cross-over stepping.  Pt will continue to benefit from skilled physical therapy intervention to address impairments, improve QOL, and attain therapy goals.      OBJECTIVE IMPAIRMENTS: Abnormal gait, decreased activity tolerance, decreased balance, decreased endurance, decreased mobility, difficulty walking, decreased ROM, decreased strength, and impaired perceived functional ability.   ACTIVITY LIMITATIONS: standing, squatting, stairs, and locomotion level  PARTICIPATION LIMITATIONS: cleaning, shopping, community activity, and yard work  PERSONAL FACTORS: Time since onset of injury/illness/exacerbation  and 3+ comorbidities: ischemic stroke, cancer in remission, arthritis,   are also affecting patient's functional outcome.   REHAB POTENTIAL: Good  CLINICAL DECISION MAKING: Stable/uncomplicated  EVALUATION COMPLEXITY: Low  PLAN:  PT FREQUENCY: 1-2x/week  PT DURATION: 12 weeks  PLANNED INTERVENTIONS: Therapeutic exercises, Therapeutic activity, Neuromuscular re-education, Balance training, Gait training, Patient/Family education, Self Care, Joint mobilization, and Stair training  PLAN FOR NEXT SESSION: Continue to monitor vitals as appropriate, advance HEP with balance exercises, gait based balance, continue with instructing in  activities she can perform in the well zone prior to discharge  Norman Herrlich, PT 05/27/2022, 9:20 AM  9:20 AM, 05/27/22 Physical Therapist - [facility]  Outpatient Physical Therapy- Main Campus [phone]

## 2022-05-29 ENCOUNTER — Ambulatory Visit: Payer: Medicare Other

## 2022-05-29 ENCOUNTER — Encounter: Payer: Medicare Other | Admitting: Speech Pathology

## 2022-05-29 DIAGNOSIS — M6281 Muscle weakness (generalized): Secondary | ICD-10-CM

## 2022-05-29 DIAGNOSIS — R2681 Unsteadiness on feet: Secondary | ICD-10-CM | POA: Diagnosis not present

## 2022-05-29 DIAGNOSIS — R269 Unspecified abnormalities of gait and mobility: Secondary | ICD-10-CM | POA: Diagnosis not present

## 2022-05-29 DIAGNOSIS — R262 Difficulty in walking, not elsewhere classified: Secondary | ICD-10-CM | POA: Diagnosis not present

## 2022-05-29 DIAGNOSIS — R2689 Other abnormalities of gait and mobility: Secondary | ICD-10-CM | POA: Diagnosis not present

## 2022-05-29 NOTE — Therapy (Signed)
OUTPATIENT PHYSICAL THERAPY NEURO TREATMENT     Patient Name: Natalie [name] MRN: [id number] DOB:Jun 26, 1950, 72 y.o., female Today's Date: 05/29/2022   PCP: Alba Cory, MD REFERRING PROVIDER: Lonell Face, MD   END OF SESSION:  PT End of Session - 05/29/22 1438     Visit Number 12    Number of Visits 24    Date for PT Re-Evaluation 06/16/22    Authorization Type Traditional Medicare with [employer] Supplemental: VL based on certification    Authorization Time Period 03/24/22-06/16/22    Progress Note Due on Visit 10    PT Start Time 1436    PT Stop Time 1511    PT Time Calculation (min) 35 min    Equipment Utilized During Treatment Gait belt    Activity Tolerance Patient tolerated treatment well;No increased pain    Behavior During Therapy WFL for tasks assessed/performed                     Past Medical History:  Diagnosis Date   Acute bronchospasm due to viral infection 2020   Due to pollen   Acute ischemic left MCA stroke 04/19/2020   a.) CTA head/neck 04/19/2020 --> nearly occlusive thrombus at bifurcation of a proximal M2 MCA branch approximately 6 mm from the MCA bifurcation. Subsequent occlusion of a proximal left M3 MCA branch   Allergy    Anemia    Angina at rest    Breast cancer, left 08/21/2021   a.) stereotactic Bx 08/21/2021 --> IMC (G1, ER/PR +, Her2/neu -)   Cataract    a.) s/p extraction with IOL placement   Depressive disorder    Diverticulosis    Dyspnea    Epistaxis    Fatigue    Hearing loss    History of 2019 novel coronavirus disease (COVID-19) 12/18/2020   History of ischemic multifocal posterior circulation stroke 02/28/2020   a.) presented to ED with increasing episodes of  transient vertical binocular diplopia; MRI 02/27/2021 --> multiple punctate acute to subacute ischemic infarcts involving the cortical/subcortical aspects of both parieto-occipital regions   History of loop recorder    History of radiation therapy     Vagina-02/11/21-03/14/21- Dr. 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   a.) stage 1A; grade 1 (pT1a, pN0); b.) s/p TLH/BSO 12/18/2020 + adjuvant vaginal brachytherapy (30 Gy over 5 fractions between 02/11/2021 - 03/14/2021)   Vitamin D deficiency    Past Surgical History:  Procedure Laterality Date   ABDOMINAL HYSTERECTOMY  12/18/2020   ABLATION  2006   AXILLARY SENTINEL NODE BIOPSY Left 09/25/2021   Procedure: AXILLARY SENTINEL NODE BIOPSY;  Surgeon: Earline Mayotte, MD;  Location: [facility];  Service: General;  Laterality: Left;   BREAST BIOPSY Left 08/21/2021   Stereo Bx, X-clip; path --> IMC (G1, ER/PR +, Her2/neu -)   BREAST LUMPECTOMY WITH NEEDLE LOCALIZATION Left 09/25/2021   Procedure: BREAST LUMPECTOMY WITH NEEDLE LOCALIZATION;  Surgeon: Earline Mayotte, MD;  Location: [facility];  Service: General;  Laterality: Left;   BUBBLE STUDY  04/23/2020   Procedure: BUBBLE STUDY;  Surgeon: Laurey Morale, MD;  Location: [facility];  Service: Cardiovascular;;   CATARACT EXTRACTION W/ INTRAOCULAR LENS IMPLANT Bilateral    COLONOSCOPY     COLONOSCOPY WITH PROPOFOL N/A 12/22/2018   Procedure: COLONOSCOPY WITH PROPOFOL;  Surgeon: Pasty Spillers, MD;  Location: [facility];  Service: Endoscopy;  Laterality: N/A;   EYE SURGERY  Spring 2018   Cataracts both eyes   HYSTEROSCOPY WITH D & C N/A 11/01/2020   Procedure: DILATATION AND CURETTAGE /HYSTEROSCOPY WITH MYOSURE;  Surgeon: Carver Fila, MD;  Location: [facility];  Service: Gynecology;  Laterality: N/A;   LAPAROTOMY   12/18/2020   Procedure: LAPAROTOMY;  Surgeon: Carver Fila, MD;  Location: [facility];  Service: Gynecology;;   LOOP RECORDER INSERTION N/A 04/23/2020   Procedure: LOOP RECORDER INSERTION;  Surgeon: Marinus Maw, MD;  Location: [facility] IN[facility]ASIVE CV LAB;  Service: Cardiovascular;  Laterality: N/A;   ROBOTIC ASSISTED TOTAL HYSTERECTOMY WITH BILATERAL SALPINGO OOPHERECTOMY Bilateral 12/18/2020   Procedure: XI ROBOTIC ASSISTED TOTAL HYSTERECTOMY WITH BILATERAL SALPINGO OOPHORECTOMY;  Surgeon: Carver Fila, MD;  Location: [facility];  Service: Gynecology;  Laterality: Bilateral;   SENTINEL NODE BIOPSY N/A 12/18/2020   Procedure: SENTINEL NODE BIOPSY;  Surgeon: Carver Fila, MD;  Location: [facility];  Service: Gynecology;  Laterality: N/A;   TEE WITHOUT CARDIOVERSION N/A 04/23/2020   Procedure: TRANSESOPHAGEAL ECHOCARDIOGRAM (TEE);  Surgeon: Laurey Morale, MD;  Location: [facility] END[facility];  Service: Cardiovascular;  Laterality: N/A;   TONSILLECTOMY     Patient Active Problem List   Diagnosis Date Noted   Cerebrovascular accident (CVA) 12/05/2021   Acute CVA (cerebrovascular accident) 12/04/2021   Trigger finger, right ring finger 09/10/2021   Dupuytren's contracture of both hands 09/10/2021   Vitamin D deficiency 09/10/2021   Malignant neoplasm of left breast in female, estrogen receptor positive 09/01/2021   Dyslipidemia 05/16/2021   Major depression, recurrent, chronic 03/13/2021   Mild protein-calorie malnutrition 03/13/2021   Endometrial cancer 12/26/2020   Postoperative anemia due to acute blood loss 12/19/2020   History of CVA in adulthood 04/19/2020   History of ischemic multifocal posterior circulation stroke 02/29/2020   Difficulty hearing 06/28/2015   Arthritis, degenerative 06/28/2015   Paresthesia 06/28/2015   Purpura, nonthrombopenic 06/28/2015   Adult hypothyroidism 08/09/2014   Migraine without aura and without status migrainosus, not intractable 09/19/2008   Moderate  major depression 09/07/2006   OP (osteoporosis) 09/07/2006    ONSET DATE: 04/19/20  REFERRING DIAG: X32.44 (ICD-10-CM) - Personal history of transient ischemic attack (TIA), and cerebral infarction without residual deficits   THERAPY DIAG:  Muscle weakness (generalized)  Rationale for Evaluation and Treatment: Rehabilitation  SUBJECTIVE:  SUBJECTIVE STATEMENT:  Pt reports this is third appointment time she missed this week, is having trouble finding a way to remember correct appointment time.    PERTINENT HISTORY: Pt is a 72 year old female with history of multiple strokes, migraine, "Subjective versus Mild Cognitive Impairment (word finding difficulty, short-term recall), in patient with positive family history of Alzheimer's Disease, elevated p-tau, unremarkable SNFL," moderate non-positional obstructive sleep apnea, subjective upper extremity weakness, slight ataxia, imbalance, decreased sensation to bilateral lower extremity, hyperreflexia as well as chemo radiation for endometrial cancer and recent radiation to her left breast.   PAIN:  Are you having pain? No  PRECAUTIONS: None  WEIGHT BEARING RESTRICTIONS: No  FALLS: Has patient fallen in last 6 months? Yes. Number of falls 1 fall   LIVING ENVIRONMENT:  Lives with:  Lives with DTR who drops in and drops in once in a week Lives in: House/apartment Stairs: Yes: Internal: 15 steps; on left going up and External: 5 steps; can reach both Has following equipment at home: Single point cane  PLOF: Independent  PATIENT GOALS: walking straight ( without veering), feel more comfortable walking out and about in the park, improve walking speed.   OBJECTIVE:   TODAY'S TREATMENT:      05/29/22                                                                         TE: Leg press - bilateral 20# 10x 40# 10x 45# 10x Rates medium  Standing hip abduction 15x, 10x each LE. Rates more difficult on LLE   Single leg press LLE 25# 10x, 30# 10x  Single leg heel raise 2x10 each LE. Reports feeling more in dorsum of L foot   Assessment of bilat LE digits - noted hypomobility IP joints of Left 2nd 3rd toes. Assessed as pt reported feeling pressure in L foot with heel raise.  Alternating step up onto 6" step x multiple reps each LE  Staggered STS 2x10 each LE   PATIENT EDUCATION: Allow for adequate rest breaks when motor fatigue intereferes with quality work of balance training.   HOME EXERCISE PROGRAM: Access Code: [email or web address] URL: [email or web address] Date: 04/02/2022 Prepared by: Maureen Ralphs  Exercises - Tandem Stance  - 3 x weekly - 3 sets - 20-30 sec hold - Single Leg Stance  - 3 x weekly - 3-4 sets - up to 20 sec hold    Access Code: [email or web address]  URL: [email or web address]  Date: 03/27/2022  Prepared by: Tomasa Hose   Exercises -  Sit to Stand - 1 x daily - 4 x weekly - 3 sets - 10 reps -  Standing Hip Abduction with Counter Support - 1 x daily - 4 x weekly - 3 sets - 15 reps -  Seated Heel Raise - 1 x daily - 4 x weekly - 3 sets - 15 reps -  Heel rises with counter support - 1 x daily - 4 x weekly - 3 sets - 15 reps -  Standing March with Counter Support - 1 x daily - 4 x weekly - 3 sets - 15 reps   GOALS: Goals reviewed with patient? Yes  SHORT TERM GOALS: Target date: 04/22/2022  Patient will be independent in home exercise program to improve strength/mobility for better functional independence with ADLs. Baseline: No HEP currently 1/15:Pt performing every other day or every 2 days, pt still progressing with gym based program for discharge and is not comfortable with program at this point  Goal status: Partially MET   LONG TERM GOALS: Target date: 06/17/2022      1.   Patient will increase FOTO score to equal to or greater than  55   to demonstrate statistically significant improvement in mobility and quality of life.  Baseline: 52 4/15: 58%  Goal status: MET   2.  Patient will increase Mini Best Balance score by > 4 points to demonstrate decreased fall risk during functional activities. Baseline: 19 05/19/22: 23 Goal status: MET   3.   Patient will reduce timed up and go to <11 seconds to reduce fall risk and demonstrate improved transfer/gait ability. Baseline: 14.6 sec 05/19/22: 10.6 Goal status: MET  4.   Patient will increase 10 meter walk test to >1.59m/s as to improve gait speed for better community ambulation and to reduce fall risk. Baseline: .89 m/s 05/19/22: 1.21 m/s Goal status: MET  5.   Patient will increase six minute walk test distance to >1630 feet and with no signs of imbalance for progression to super community ambulator. Baseline: 03/27/22: 1427ft, no device, intermitent drifting/sway/crossover 05/19/22:1360 ft Goal status: ONGOING     ASSESSMENT:  CLINICAL IMPRESSION: Pt presents to appointment at incorrect time, however, is extremely motivated to participate in session. Pt able to perform higher lever strengthening activities on leg press and with more challenging variations of STS, but does still report increased LLE fatigue compared to R. The pt will continue to benefit from skilled physical therapy intervention to address impairments, improve QOL, and attain therapy goals.      OBJECTIVE IMPAIRMENTS: Abnormal gait, decreased activity tolerance, decreased balance, decreased endurance, decreased mobility, difficulty walking, decreased ROM, decreased strength, and impaired perceived functional ability.   ACTIVITY LIMITATIONS: standing, squatting, stairs, and locomotion level  PARTICIPATION LIMITATIONS: cleaning, shopping, community activity, and yard work  PERSONAL FACTORS: Time since onset of injury/illness/exacerbation and  3+ comorbidities: ischemic stroke, cancer in remission, arthritis,   are also affecting patient's functional outcome.   REHAB POTENTIAL: Good  CLINICAL DECISION MAKING: Stable/uncomplicated  EVALUATION COMPLEXITY: Low  PLAN:  PT FREQUENCY: 1-2x/week  PT DURATION: 12 weeks  PLANNED INTERVENTIONS: Therapeutic exercises, Therapeutic activity, Neuromuscular re-education, Balance training, Gait training, Patient/Family education, Self Care, Joint mobilization, and Stair training  PLAN FOR NEXT SESSION: Continue to monitor vitals as appropriate, advance HEP with balance exercises, gait based balance, continue with instructing in  activities she can perform in the well zone prior to discharge  Baird Kay, PT 05/29/2022, 5:03 PM  5:03 PM, 05/29/22 Physical Therapist - [facility]  Outpatient Physical Therapy- [facility] [phone]

## 2022-06-02 ENCOUNTER — Ambulatory Visit: Payer: Self-pay | Admitting: Radiation Oncology

## 2022-06-03 ENCOUNTER — Ambulatory Visit: Payer: Medicare Other

## 2022-06-03 ENCOUNTER — Encounter: Payer: Medicare Other | Admitting: Speech Pathology

## 2022-06-03 DIAGNOSIS — M6281 Muscle weakness (generalized): Secondary | ICD-10-CM

## 2022-06-03 DIAGNOSIS — R269 Unspecified abnormalities of gait and mobility: Secondary | ICD-10-CM

## 2022-06-03 DIAGNOSIS — R2689 Other abnormalities of gait and mobility: Secondary | ICD-10-CM

## 2022-06-03 DIAGNOSIS — R262 Difficulty in walking, not elsewhere classified: Secondary | ICD-10-CM | POA: Diagnosis not present

## 2022-06-03 DIAGNOSIS — R2681 Unsteadiness on feet: Secondary | ICD-10-CM | POA: Diagnosis not present

## 2022-06-03 NOTE — Therapy (Signed)
OUTPATIENT PHYSICAL THERAPY NEURO TREATMENT     Patient Name: Natalie [name] MRN: [id number] DOB:08-16-1950, 72 y.o., female Today's Date: 06/03/2022   PCP: Alba Cory, MD REFERRING PROVIDER: Lonell Face, MD   END OF SESSION:  PT End of Session - 06/03/22 1308     Visit Number 13    Number of Visits 24    Date for PT Re-Evaluation 06/16/22    Authorization Type Traditional Medicare with [employer] Supplemental: VL based on certification    Authorization Time Period 03/24/22-06/16/22    Progress Note Due on Visit 20    PT Start Time 1301    PT Stop Time 1341    PT Time Calculation (min) 40 min    Equipment Utilized During Treatment Gait belt    Activity Tolerance Patient tolerated treatment well;No increased pain    Behavior During Therapy WFL for tasks assessed/performed                Past Medical History:  Diagnosis Date   Acute bronchospasm due to viral infection 2020   Due to pollen   Acute ischemic left MCA stroke (HCC) 04/19/2020   a.) CTA head/neck 04/19/2020 --> nearly occlusive thrombus at bifurcation of a proximal M2 MCA branch approximately 6 mm from the MCA bifurcation. Subsequent occlusion of a proximal left M3 MCA branch   Allergy    Anemia    Angina at rest    Breast cancer, left (HCC) 08/21/2021   a.) stereotactic Bx 08/21/2021 --> IMC (G1, ER/PR +, Her2/neu -)   Cataract    a.) s/p extraction with IOL placement   Depressive disorder    Diverticulosis    Dyspnea    Epistaxis    Fatigue    Hearing loss    History of 2019 novel coronavirus disease (COVID-19) 12/18/2020   History of ischemic multifocal posterior circulation stroke 02/28/2020   a.) presented to ED with increasing episodes of  transient vertical binocular diplopia; MRI 02/27/2021 --> multiple punctate acute to subacute ischemic infarcts involving the cortical/subcortical aspects of both parieto-occipital regions   History of loop recorder    History of radiation therapy     Vagina-02/11/21-03/14/21- Dr. Antony Blackbird   HLD (hyperlipidemia)    Hypothyroidism    LAFB (left anterior fascicular block)    Long term current use of antithrombotics/antiplatelets    a.) on DAPT therapy (ASA + clopidogrel)   Memory change    Migraine    Osteoarthritis    Osteoporosis    Palpitations    Premature menopause    QT prolongation    Seizures (HCC) 1994   history of mini seizures, possible migraine induced   Stage I adenocarcinoma of endometrium (HCC) 12/18/2020   a.) stage 1A; grade 1 (pT1a, pN0); b.) s/p TLH/BSO 12/18/2020 + adjuvant vaginal brachytherapy (30 Gy over 5 fractions between 02/11/2021 - 03/14/2021)   Vitamin D deficiency    Past Surgical History:  Procedure Laterality Date   ABDOMINAL HYSTERECTOMY  12/18/2020   ABLATION  2006   AXILLARY SENTINEL NODE BIOPSY Left 09/25/2021   Procedure: AXILLARY SENTINEL NODE BIOPSY;  Surgeon: Earline Mayotte, MD;  Location: [facility];  Service: General;  Laterality: Left;   BREAST BIOPSY Left 08/21/2021   Stereo Bx, X-clip; path --> IMC (G1, ER/PR +, Her2/neu -)   BREAST LUMPECTOMY WITH NEEDLE LOCALIZATION Left 09/25/2021   Procedure: BREAST LUMPECTOMY WITH NEEDLE LOCALIZATION;  Surgeon: Earline Mayotte, MD;  Location: [facility];  Service: General;  Laterality:  Left;   BUBBLE STUDY  04/23/2020   Procedure: BUBBLE STUDY;  Surgeon: Laurey Morale, MD;  Location: [facility] ENDOSCOPY;  Service: Cardiovascular;;   CATARACT EXTRACTION W/ INTRAOCULAR LENS IMPLANT Bilateral    COLONOSCOPY     COLONOSCOPY WITH PROPOFOL N/A 12/22/2018   Procedure: COLONOSCOPY WITH PROPOFOL;  Surgeon: Pasty Spillers, MD;  Location: [facility] ENDOSCOPY;  Service: Endoscopy;  Laterality: N/A;   EYE SURGERY  Spring 2018   Cataracts both eyes   HYSTEROSCOPY WITH D & C N/A 11/01/2020   Procedure: DILATATION AND CURETTAGE /HYSTEROSCOPY WITH MYOSURE;  Surgeon: Carver Fila, MD;  Location: [facility];  Service: Gynecology;  Laterality: N/A;    LAPAROTOMY  12/18/2020   Procedure: LAPAROTOMY;  Surgeon: Carver Fila, MD;  Location: [facility];  Service: Gynecology;;   LOOP RECORDER INSERTION N/A 04/23/2020   Procedure: LOOP RECORDER INSERTION;  Surgeon: Marinus Maw, MD;  Location: [facility] INVASIVE CV LAB;  Service: Cardiovascular;  Laterality: N/A;   ROBOTIC ASSISTED TOTAL HYSTERECTOMY WITH BILATERAL SALPINGO OOPHERECTOMY Bilateral 12/18/2020   Procedure: XI ROBOTIC ASSISTED TOTAL HYSTERECTOMY WITH BILATERAL SALPINGO OOPHORECTOMY;  Surgeon: Carver Fila, MD;  Location: [facility];  Service: Gynecology;  Laterality: Bilateral;   SENTINEL NODE BIOPSY N/A 12/18/2020   Procedure: SENTINEL NODE BIOPSY;  Surgeon: Carver Fila, MD;  Location: [facility];  Service: Gynecology;  Laterality: N/A;   TEE WITHOUT CARDIOVERSION N/A 04/23/2020   Procedure: TRANSESOPHAGEAL ECHOCARDIOGRAM (TEE);  Surgeon: Laurey Morale, MD;  Location: [facility] ENDOSCOPY;  Service: Cardiovascular;  Laterality: N/A;   TONSILLECTOMY     Patient Active Problem List   Diagnosis Date Noted   Cerebrovascular accident (CVA) (HCC) 12/05/2021   Acute CVA (cerebrovascular accident) (HCC) 12/04/2021   Trigger finger, right ring finger 09/10/2021   Dupuytren's contracture of both hands 09/10/2021   Vitamin D deficiency 09/10/2021   Malignant neoplasm of left breast in female, estrogen receptor positive (HCC) 09/01/2021   Dyslipidemia 05/16/2021   Major depression, recurrent, chronic (HCC) 03/13/2021   Mild protein-calorie malnutrition (HCC) 03/13/2021   Endometrial cancer (HCC) 12/26/2020   Postoperative anemia due to acute blood loss 12/19/2020   History of CVA in adulthood 04/19/2020   History of ischemic multifocal posterior circulation stroke 02/29/2020   Difficulty hearing 06/28/2015   Arthritis, degenerative 06/28/2015   Paresthesia 06/28/2015   Purpura, nonthrombopenic (HCC) 06/28/2015   Adult hypothyroidism 08/09/2014   Migraine without aura and without status  migrainosus, not intractable 09/19/2008   Moderate major depression (HCC) 09/07/2006   OP (osteoporosis) 09/07/2006    ONSET DATE: 04/19/20  REFERRING DIAG: Z61.09 (ICD-10-CM) - Personal history of transient ischemic attack (TIA), and cerebral infarction without residual deficits   THERAPY DIAG:  Muscle weakness (generalized)  Unsteadiness on feet  Other abnormalities of gait and mobility  Difficulty in walking, not elsewhere classified  Abnormality of gait and mobility  Rationale for Evaluation and Treatment: Rehabilitation  SUBJECTIVE:  SUBJECTIVE STATEMENT: Pt doing well today, has been working in the yard trimming bushes. She has had a lot of energy lately. No other updates. Pt does not have any recollection of previously working with [employer].   PERTINENT HISTORY: Pt is a 72 year old female with history of multiple strokes, migraine, "Subjective versus Mild Cognitive Impairment (word finding difficulty, short-term recall), in patient with positive family history of Alzheimer's Disease, elevated p-tau, unremarkable SNFL," moderate non-positional obstructive sleep apnea, subjective upper extremity weakness, slight ataxia, imbalance, decreased sensation to bilateral lower extremity, hyperreflexia as well as chemo radiation for endometrial cancer and recent radiation to her left breast.   PAIN:  Are you having pain? No  PRECAUTIONS: None  WEIGHT BEARING RESTRICTIONS: No  FALLS: Has patient fallen in last 6 months? Yes. Number of falls 1 fall    PATIENT GOALS: walking straight (without veering), feel more comfortable walking out and about in the park, improve walking speed.   OBJECTIVE:   TODAY'S TREATMENT:      06/03/22                                                                         -overground AMB 3 laps LL, no device, no LOB; 1338ft -AMB over to education center -overground AMB HIIT: 183ft, 4lb AW bilat, 4x (47sec longest and 43sec shortest) best speed: 1.105m/s *pt puts forth excellent effort, however will need to modify in future to achieve targeted level of intensity.   -seated 60sec recovery in chair  -AMB back to gym -discussed continuing with [employer] in future   PATIENT EDUCATION: Allow for adequate rest breaks when motor fatigue intereferes with quality work of balance training.   HOME EXERCISE PROGRAM: Access Code: [email or web address] URL: [email or web address] Date: 04/02/2022 Prepared by: Maureen Ralphs  Exercises - Tandem Stance  - 3 x weekly - 3 sets - 20-30 sec hold - Single Leg Stance  - 3 x weekly - 3-4 sets - up to 20 sec hold    Access Code: [email or web address]  URL: [email or web address]  Date: 03/27/2022  Prepared by: Tomasa Hose   Exercises -  Sit to Stand - 1 x daily - 4 x weekly - 3 sets - 10 reps -  Standing Hip Abduction with Counter Support - 1 x daily - 4 x weekly - 3 sets - 15 reps -  Seated Heel Raise - 1 x daily - 4 x weekly - 3 sets - 15 reps -  Heel rises with counter support - 1 x daily - 4 x weekly - 3 sets - 15 reps -  Standing March with Counter Support - 1 x daily - 4 x weekly - 3 sets - 15 reps   GOALS: Goals reviewed with patient? Yes  SHORT TERM GOALS: Target date: 04/22/2022       Patient will be independent in home exercise program to improve strength/mobility for better functional independence with ADLs. Baseline: No HEP currently 1/15:Pt performing every other day or every 2 days, pt still progressing with gym based program for discharge and is not comfortable with program at this point  Goal status: Partially MET   LONG TERM GOALS: Target date: 06/17/2022      1.  Patient will increase  FOTO score to equal to or greater than  55   to demonstrate statistically significant improvement  in mobility and quality of life.  Baseline: 52 4/15: 58%  Goal status: MET   2.  Patient will increase Mini Best Balance score by > 4 points to demonstrate decreased fall risk during functional activities. Baseline: 19 05/19/22: 23 Goal status: MET   3.   Patient will reduce timed up and go to <11 seconds to reduce fall risk and demonstrate improved transfer/gait ability. Baseline: 14.6 sec 05/19/22: 10.6 Goal status: MET  4.   Patient will increase 10 meter walk test to >1.95m/s as to improve gait speed for better community ambulation and to reduce fall risk. Baseline: .89 m/s 05/19/22: 1.21 m/s Goal status: MET  5.   Patient will increase six minute walk test distance to >1630 feet and with no signs of imbalance for progression to super community ambulator. Baseline: 03/27/22: 1461ft, no device, intermitent drifting/sway/crossover 05/19/22:1360 ft Goal status: ONGOING     ASSESSMENT:  CLINICAL IMPRESSION: Pt presents to appointment at incorrect time, however, is extremely motivated to participate in session. Pt able to perform higher lever strengthening activities on leg press and with more challenging variations of STS, but does still report increased LLE fatigue compared to R. The pt will continue to benefit from skilled physical therapy intervention to address impairments, improve QOL, and attain therapy goals.      OBJECTIVE IMPAIRMENTS: Abnormal gait, decreased activity tolerance, decreased balance, decreased endurance, decreased mobility, difficulty walking, decreased ROM, decreased strength, and impaired perceived functional ability.   ACTIVITY LIMITATIONS: standing, squatting, stairs, and locomotion level  PARTICIPATION LIMITATIONS: cleaning, shopping, community activity, and yard work  PERSONAL FACTORS: Time since onset of injury/illness/exacerbation and 3+ comorbidities: ischemic stroke, cancer in remission, arthritis,   are also affecting patient's functional outcome.    REHAB POTENTIAL: Good  CLINICAL DECISION MAKING: Stable/uncomplicated  EVALUATION COMPLEXITY: Low  PLAN:  PT FREQUENCY: 1-2x/week  PT DURATION: 12 weeks  PLANNED INTERVENTIONS: Therapeutic exercises, Therapeutic activity, Neuromuscular re-education, Balance training, Gait training, Patient/Family education, Self Care, Joint mobilization, and Stair training  PLAN FOR NEXT SESSION: Based on most recent reassessment and review of goals, focused on advancing AMB performance, speed, and tolerance- particularly with longer distance. Trial of high intensity intervals, excellent effort and response, but stimulus falls short of desire target range per author. Continue to monitor vitals as appropriate, advance HEP with balance exercises, gait based balance, continue with instructing in activities she can perform in the well zone prior to discharge.  Tabia Landowski C, PT 06/03/2022, 1:19 PM  1:19 PM, 06/03/22 Physical Therapist - [facility]  Outpatient Physical Therapy- Main Campus [phone]

## 2022-06-05 ENCOUNTER — Encounter: Payer: Medicare Other | Admitting: Speech Pathology

## 2022-06-05 ENCOUNTER — Ambulatory Visit: Payer: Medicare Other | Attending: Neurology | Admitting: Physical Therapy

## 2022-06-05 ENCOUNTER — Encounter: Payer: Self-pay | Admitting: Physical Therapy

## 2022-06-05 DIAGNOSIS — R2681 Unsteadiness on feet: Secondary | ICD-10-CM

## 2022-06-05 DIAGNOSIS — M6281 Muscle weakness (generalized): Secondary | ICD-10-CM

## 2022-06-05 DIAGNOSIS — R269 Unspecified abnormalities of gait and mobility: Secondary | ICD-10-CM | POA: Diagnosis not present

## 2022-06-05 DIAGNOSIS — R2689 Other abnormalities of gait and mobility: Secondary | ICD-10-CM | POA: Diagnosis not present

## 2022-06-05 DIAGNOSIS — R262 Difficulty in walking, not elsewhere classified: Secondary | ICD-10-CM | POA: Diagnosis not present

## 2022-06-05 NOTE — Therapy (Signed)
OUTPATIENT PHYSICAL THERAPY NEURO TREATMENT     Patient Name: Natalie [name] MRN: [id number] DOB:09-24-1950, 72 y.o., female Today's Date: 06/05/2022   PCP: Alba Cory, MD REFERRING PROVIDER: Lonell Face, MD   END OF SESSION:  PT End of Session - 06/05/22 1301     Visit Number 14    Number of Visits 24    Date for PT Re-Evaluation 06/16/22    Authorization Type Traditional Medicare with [employer] Supplemental: VL based on certification    Authorization Time Period 03/24/22-06/16/22    Progress Note Due on Visit 20    PT Start Time 1301    PT Stop Time 1344    PT Time Calculation (min) 43 min    Equipment Utilized During Treatment Gait belt    Activity Tolerance Patient tolerated treatment well;No increased pain    Behavior During Therapy WFL for tasks assessed/performed                      Past Medical History:  Diagnosis Date   Acute bronchospasm due to viral infection 2020   Due to pollen   Acute ischemic left MCA stroke (HCC) 04/19/2020   a.) CTA head/neck 04/19/2020 --> nearly occlusive thrombus at bifurcation of a proximal M2 MCA branch approximately 6 mm from the MCA bifurcation. Subsequent occlusion of a proximal left M3 MCA branch   Allergy    Anemia    Angina at rest    Breast cancer, left (HCC) 08/21/2021   a.) stereotactic Bx 08/21/2021 --> IMC (G1, ER/PR +, Her2/neu -)   Cataract    a.) s/p extraction with IOL placement   Depressive disorder    Diverticulosis    Dyspnea    Epistaxis    Fatigue    Hearing loss    History of 2019 novel coronavirus disease (COVID-19) 12/18/2020   History of ischemic multifocal posterior circulation stroke 02/28/2020   a.) presented to ED with increasing episodes of  transient vertical binocular diplopia; MRI 02/27/2021 --> multiple punctate acute to subacute ischemic infarcts involving the cortical/subcortical aspects of both parieto-occipital regions   History of loop recorder    History of radiation  therapy    Vagina-02/11/21-03/14/21- Dr. Antony Blackbird   HLD (hyperlipidemia)    Hypothyroidism    LAFB (left anterior fascicular block)    Long term current use of antithrombotics/antiplatelets    a.) on DAPT therapy (ASA + clopidogrel)   Memory change    Migraine    Osteoarthritis    Osteoporosis    Palpitations    Premature menopause    QT prolongation    Seizures (HCC) 1994   history of mini seizures, possible migraine induced   Stage I adenocarcinoma of endometrium (HCC) 12/18/2020   a.) stage 1A; grade 1 (pT1a, pN0); b.) s/p TLH/BSO 12/18/2020 + adjuvant vaginal brachytherapy (30 Gy over 5 fractions between 02/11/2021 - 03/14/2021)   Vitamin D deficiency    Past Surgical History:  Procedure Laterality Date   ABDOMINAL HYSTERECTOMY  12/18/2020   ABLATION  2006   AXILLARY SENTINEL NODE BIOPSY Left 09/25/2021   Procedure: AXILLARY SENTINEL NODE BIOPSY;  Surgeon: Earline Mayotte, MD;  Location: [facility] ORS;  Service: General;  Laterality: Left;   BREAST BIOPSY Left 08/21/2021   Stereo Bx, X-clip; path --> IMC (G1, ER/PR +, Her2/neu -)   BREAST LUMPECTOMY WITH NEEDLE LOCALIZATION Left 09/25/2021   Procedure: BREAST LUMPECTOMY WITH NEEDLE LOCALIZATION;  Surgeon: Earline Mayotte, MD;  Location: [facility]  ORS;  Service: General;  Laterality: Left;   BUBBLE STUDY  04/23/2020   Procedure: BUBBLE STUDY;  Surgeon: Laurey Morale, MD;  Location: [facility] ENDOSCOPY;  Service: Cardiovascular;;   CATARACT EXTRACTION W/ INTRAOCULAR LENS IMPLANT Bilateral    COLONOSCOPY     COLONOSCOPY WITH PROPOFOL N/A 12/22/2018   Procedure: COLONOSCOPY WITH PROPOFOL;  Surgeon: Pasty Spillers, MD;  Location: [facility] ENDOSCOPY;  Service: Endoscopy;  Laterality: N/A;   EYE SURGERY  Spring 2018   Cataracts both eyes   HYSTEROSCOPY WITH D & C N/A 11/01/2020   Procedure: DILATATION AND CURETTAGE /HYSTEROSCOPY WITH MYOSURE;  Surgeon: Carver Fila, MD;  Location: [facility] ORS;  Service: Gynecology;  Laterality: N/A;    LAPAROTOMY  12/18/2020   Procedure: LAPAROTOMY;  Surgeon: Carver Fila, MD;  Location: [facility] ORS;  Service: Gynecology;;   LOOP RECORDER INSERTION N/A 04/23/2020   Procedure: LOOP RECORDER INSERTION;  Surgeon: Marinus Maw, MD;  Location: [facility] INVASIVE CV LAB;  Service: Cardiovascular;  Laterality: N/A;   ROBOTIC ASSISTED TOTAL HYSTERECTOMY WITH BILATERAL SALPINGO OOPHERECTOMY Bilateral 12/18/2020   Procedure: XI ROBOTIC ASSISTED TOTAL HYSTERECTOMY WITH BILATERAL SALPINGO OOPHORECTOMY;  Surgeon: Carver Fila, MD;  Location: [facility] ORS;  Service: Gynecology;  Laterality: Bilateral;   SENTINEL NODE BIOPSY N/A 12/18/2020   Procedure: SENTINEL NODE BIOPSY;  Surgeon: Carver Fila, MD;  Location: [facility] ORS;  Service: Gynecology;  Laterality: N/A;   TEE WITHOUT CARDIOVERSION N/A 04/23/2020   Procedure: TRANSESOPHAGEAL ECHOCARDIOGRAM (TEE);  Surgeon: Laurey Morale, MD;  Location: [facility] ENDOSCOPY;  Service: Cardiovascular;  Laterality: N/A;   TONSILLECTOMY     Patient Active Problem List   Diagnosis Date Noted   Cerebrovascular accident (CVA) (HCC) 12/05/2021   Acute CVA (cerebrovascular accident) (HCC) 12/04/2021   Trigger finger, right ring finger 09/10/2021   Dupuytren's contracture of both hands 09/10/2021   Vitamin D deficiency 09/10/2021   Malignant neoplasm of left breast in female, estrogen receptor positive (HCC) 09/01/2021   Dyslipidemia 05/16/2021   Major depression, recurrent, chronic (HCC) 03/13/2021   Mild protein-calorie malnutrition (HCC) 03/13/2021   Endometrial cancer (HCC) 12/26/2020   Postoperative anemia due to acute blood loss 12/19/2020   History of CVA in adulthood 04/19/2020   History of ischemic multifocal posterior circulation stroke 02/29/2020   Difficulty hearing 06/28/2015   Arthritis, degenerative 06/28/2015   Paresthesia 06/28/2015   Purpura, nonthrombopenic (HCC) 06/28/2015   Adult hypothyroidism 08/09/2014   Migraine without aura and without  status migrainosus, not intractable 09/19/2008   Moderate major depression (HCC) 09/07/2006   OP (osteoporosis) 09/07/2006    ONSET DATE: 04/19/20  REFERRING DIAG: W29.56 (ICD-10-CM) - Personal history of transient ischemic attack (TIA), and cerebral infarction without residual deficits   THERAPY DIAG:  Muscle weakness (generalized)  Unsteadiness on feet  Rationale for Evaluation and Treatment: Rehabilitation  SUBJECTIVE:  SUBJECTIVE STATEMENT:  Patient reporting feeling good. Has been busy shopping for a dresser for her daughter and planning to drive to [place] to look at a puppy for her daughter.    PERTINENT HISTORY: Pt is a 72 year old female with history of multiple strokes, migraine, "Subjective versus Mild Cognitive Impairment (word finding difficulty, short-term recall), in patient with positive family history of Alzheimer's Disease, elevated p-tau, unremarkable SNFL," moderate non-positional obstructive sleep apnea, subjective upper extremity weakness, slight ataxia, imbalance, decreased sensation to bilateral lower extremity, hyperreflexia as well as chemo radiation for endometrial cancer and recent radiation to her left breast.   PAIN:  Are you having pain? No  PRECAUTIONS: None  WEIGHT BEARING RESTRICTIONS: No  FALLS: Has patient fallen in last 6 months? Yes. Number of falls 1 fall   LIVING ENVIRONMENT:  Lives with:  Lives with DTR who drops in and drops in once in a week Lives in: House/apartment Stairs: Yes: Internal: 15 steps; on left going up and External: 5 steps; can reach both Has following equipment at home: Single point cane  PLOF: Independent  PATIENT GOALS: walking straight ( without veering), feel more comfortable walking out and about in the park, improve walking  speed.   OBJECTIVE:   TODAY'S TREATMENT:      06/05/22  TE: Standing marching 4# AW 2 x 10 each LE  Standing hip abduction 4# AW 2 x 10 each LE Standing hamstring curls 4# AW 2 x 10 each LE Seated heel/toe raise 4# AW x 20 Sit to stand with staggered feet x 10 leading with each LE Step up 6 in step x 15 each LE   NMR: Ambulation around gym x 300' - normal speed Ambulation x 86' with vertical and horizontal head turns, x1 LOB with vertical head turns Gait with handing ball posterolateral to therapist 86 feet x 2  Step over dynadisc x 10 leading with each LE, patient reports hard Airex balance beam forward x 4 laps Airex balance beam side steps x 4 laps   PATIENT EDUCATION: Allow for adequate rest breaks when motor fatigue intereferes with quality work of balance training.   HOME EXERCISE PROGRAM: Access Code: [email or web address] URL: [email or web address] Date: 04/02/2022 Prepared by: Maureen Ralphs  Exercises - Tandem Stance  - 3 x weekly - 3 sets - 20-30 sec hold - Single Leg Stance  - 3 x weekly - 3-4 sets - up to 20 sec hold    Access Code: [email or web address]  URL: [email or web address]  Date: 03/27/2022  Prepared by: Tomasa Hose   Exercises -  Sit to Stand - 1 x daily - 4 x weekly - 3 sets - 10 reps -  Standing Hip Abduction with Counter Support - 1 x daily - 4 x weekly - 3 sets - 15 reps -  Seated Heel Raise - 1 x daily - 4 x weekly - 3 sets - 15 reps -  Heel rises with counter support - 1 x daily - 4 x weekly - 3 sets - 15 reps -  Standing March with Counter Support - 1 x daily - 4 x weekly - 3 sets - 15 reps   GOALS: Goals reviewed with patient? Yes  SHORT TERM GOALS: Target date: 04/22/2022       Patient will be independent in home exercise program to improve strength/mobility for better functional independence with ADLs. Baseline: No HEP currently 1/15:Pt performing every other day or every 2 days, pt still progressing with gym based  program  for discharge and is not comfortable with program at this point  Goal status: Partially MET   LONG TERM GOALS: Target date: 06/17/2022      1.  Patient will increase FOTO score to equal to or greater than  55   to demonstrate statistically significant improvement in mobility and quality of life.  Baseline: 52 4/15: 58%  Goal status: MET   2.  Patient will increase Mini Best Balance score by > 4 points to demonstrate decreased fall risk during functional activities. Baseline: 19 05/19/22: 23 Goal status: MET   3.   Patient will reduce timed up and go to <11 seconds to reduce fall risk and demonstrate improved transfer/gait ability. Baseline: 14.6 sec 05/19/22: 10.6 Goal status: MET  4.   Patient will increase 10 meter walk test to >1.60m/s as to improve gait speed for better community ambulation and to reduce fall risk. Baseline: .89 m/s 05/19/22: 1.21 m/s Goal status: MET  5.   Patient will increase six minute walk test distance to >1630 feet and with no signs of imbalance for progression to super community ambulator. Baseline: 03/27/22: 1422ft, no device, intermitent drifting/sway/crossover 05/19/22:1360 ft Goal status: ONGOING     ASSESSMENT:  CLINICAL IMPRESSION:  Patient presents to physical therapy treatment session motivated and eager to make progress towards goals. Session focused on dynamic balance activities and BLE strengthening exercises. Increased difficulty noted with unstable surface with minor LOBs throughout but able to self correct without therapist assist. Patient will continue to benefit from skilled therapy to address remaining deficits in order to improve quality of life and return to PLOF.   OBJECTIVE IMPAIRMENTS: Abnormal gait, decreased activity tolerance, decreased balance, decreased endurance, decreased mobility, difficulty walking, decreased ROM, decreased strength, and impaired perceived functional ability.   ACTIVITY LIMITATIONS: standing,  squatting, stairs, and locomotion level  PARTICIPATION LIMITATIONS: cleaning, shopping, community activity, and yard work  PERSONAL FACTORS: Time since onset of injury/illness/exacerbation and 3+ comorbidities: ischemic stroke, cancer in remission, arthritis,   are also affecting patient's functional outcome.   REHAB POTENTIAL: Good  CLINICAL DECISION MAKING: Stable/uncomplicated  EVALUATION COMPLEXITY: Low  PLAN:  PT FREQUENCY: 1-2x/week  PT DURATION: 12 weeks  PLANNED INTERVENTIONS: Therapeutic exercises, Therapeutic activity, Neuromuscular re-education, Balance training, Gait training, Patient/Family education, Self Care, Joint mobilization, and Stair training  PLAN FOR NEXT SESSION: Continue to monitor vitals as appropriate, advance HEP with balance exercises, gait based balance, continue with instructing in  activities she can perform in the well zone prior to discharge  Viviann Spare, PT 06/05/2022, 1:55 PM  1:55 PM, 06/05/22 Physical Therapist - [facility]  Outpatient Physical Therapy- Main Campus [phone]

## 2022-06-09 ENCOUNTER — Encounter: Payer: Medicare Other | Admitting: Speech Pathology

## 2022-06-09 ENCOUNTER — Ambulatory Visit: Payer: Medicare Other | Admitting: Physical Therapy

## 2022-06-09 DIAGNOSIS — R413 Other amnesia: Secondary | ICD-10-CM | POA: Diagnosis not present

## 2022-06-09 DIAGNOSIS — R262 Difficulty in walking, not elsewhere classified: Secondary | ICD-10-CM

## 2022-06-09 DIAGNOSIS — R2681 Unsteadiness on feet: Secondary | ICD-10-CM

## 2022-06-09 DIAGNOSIS — R2689 Other abnormalities of gait and mobility: Secondary | ICD-10-CM | POA: Diagnosis not present

## 2022-06-09 DIAGNOSIS — R269 Unspecified abnormalities of gait and mobility: Secondary | ICD-10-CM | POA: Diagnosis not present

## 2022-06-09 DIAGNOSIS — M6281 Muscle weakness (generalized): Secondary | ICD-10-CM | POA: Diagnosis not present

## 2022-06-09 NOTE — Therapy (Signed)
OUTPATIENT PHYSICAL THERAPY NEURO TREATMENT     Patient Name: Natalie [name] MRN: [id number] DOB:[birth year], 72 y.o., female Today's Date: 06/09/2022   PCP: Alba Cory, MD REFERRING PROVIDER: Lonell Face, MD   END OF SESSION:  PT End of Session - 06/09/22 1558     Visit Number 15    Number of Visits 24    Date for PT Re-Evaluation 06/16/22    Authorization Type Traditional Medicare with [employer] Supplemental: VL based on certification    Authorization Time Period 03/24/22-06/16/22    Progress Note Due on Visit 20    PT Start Time 1602    PT Stop Time 1645    PT Time Calculation (min) 43 min    Equipment Utilized During Treatment Gait belt    Activity Tolerance Patient tolerated treatment well;No increased pain    Behavior During Therapy WFL for tasks assessed/performed                      Past Medical History:  Diagnosis Date   Acute bronchospasm due to viral infection 2020   Due to pollen   Acute ischemic left MCA stroke (HCC) 04/19/2020   a.) CTA head/neck 04/19/2020 --> nearly occlusive thrombus at bifurcation of a proximal M2 MCA branch approximately 6 mm from the MCA bifurcation. Subsequent occlusion of a proximal left M3 MCA branch   Allergy    Anemia    Angina at rest    Breast cancer, left (HCC) 08/21/2021   a.) stereotactic Bx 08/21/2021 --> IMC (G1, ER/PR +, Her2/neu -)   Cataract    a.) s/p extraction with IOL placement   Depressive disorder    Diverticulosis    Dyspnea    Epistaxis    Fatigue    Hearing loss    History of 2019 novel coronavirus disease (COVID-19) 12/18/2020   History of ischemic multifocal posterior circulation stroke 02/28/2020   a.) presented to ED with increasing episodes of  transient vertical binocular diplopia; MRI 02/27/2021 --> multiple punctate acute to subacute ischemic infarcts involving the cortical/subcortical aspects of both parieto-occipital regions   History of loop recorder    History of radiation  therapy    Vagina-02/11/21-03/14/21- Dr. Antony Blackbird   HLD (hyperlipidemia)    Hypothyroidism    LAFB (left anterior fascicular block)    Long term current use of antithrombotics/antiplatelets    a.) on DAPT therapy (ASA + clopidogrel)   Memory change    Migraine    Osteoarthritis    Osteoporosis    Palpitations    Premature menopause    QT prolongation    Seizures (HCC) 1994   history of mini seizures, possible migraine induced   Stage I adenocarcinoma of endometrium (HCC) 12/18/2020   a.) stage 1A; grade 1 (pT1a, pN0); b.) s/p TLH/BSO 12/18/2020 + adjuvant vaginal brachytherapy (30 Gy over 5 fractions between 02/11/2021 - 03/14/2021)   Vitamin D deficiency    Past Surgical History:  Procedure Laterality Date   ABDOMINAL HYSTERECTOMY  12/18/2020   ABLATION  2006   AXILLARY SENTINEL NODE BIOPSY Left 09/25/2021   Procedure: AXILLARY SENTINEL NODE BIOPSY;  Surgeon: Earline Mayotte, MD;  Location: [facility] ORS;  Service: General;  Laterality: Left;   BREAST BIOPSY Left 08/21/2021   Stereo Bx, X-clip; path --> IMC (G1, ER/PR +, Her2/neu -)   BREAST LUMPECTOMY WITH NEEDLE LOCALIZATION Left 09/25/2021   Procedure: BREAST LUMPECTOMY WITH NEEDLE LOCALIZATION;  Surgeon: Earline Mayotte, MD;  Location: [facility]  ORS;  Service: General;  Laterality: Left;   BUBBLE STUDY  04/23/2020   Procedure: BUBBLE STUDY;  Surgeon: Laurey Morale, MD;  Location: [facility] ENDOSCOPY;  Service: Cardiovascular;;   CATARACT EXTRACTION W/ INTRAOCULAR LENS IMPLANT Bilateral    COLONOSCOPY     COLONOSCOPY WITH PROPOFOL N/A 12/22/2018   Procedure: COLONOSCOPY WITH PROPOFOL;  Surgeon: Pasty Spillers, MD;  Location: [facility] ENDOSCOPY;  Service: Endoscopy;  Laterality: N/A;   EYE SURGERY  Spring 2018   Cataracts both eyes   HYSTEROSCOPY WITH D & C N/A 11/01/2020   Procedure: DILATATION AND CURETTAGE /HYSTEROSCOPY WITH MYOSURE;  Surgeon: Carver Fila, MD;  Location: [facility] ORS;  Service: Gynecology;  Laterality: N/A;    LAPAROTOMY  12/18/2020   Procedure: LAPAROTOMY;  Surgeon: Carver Fila, MD;  Location: [facility] ORS;  Service: Gynecology;;   LOOP RECORDER INSERTION N/A 04/23/2020   Procedure: LOOP RECORDER INSERTION;  Surgeon: Marinus Maw, MD;  Location: [facility] INVASIVE CV LAB;  Service: Cardiovascular;  Laterality: N/A;   ROBOTIC ASSISTED TOTAL HYSTERECTOMY WITH BILATERAL SALPINGO OOPHERECTOMY Bilateral 12/18/2020   Procedure: XI ROBOTIC ASSISTED TOTAL HYSTERECTOMY WITH BILATERAL SALPINGO OOPHORECTOMY;  Surgeon: Carver Fila, MD;  Location: [facility] ORS;  Service: Gynecology;  Laterality: Bilateral;   SENTINEL NODE BIOPSY N/A 12/18/2020   Procedure: SENTINEL NODE BIOPSY;  Surgeon: Carver Fila, MD;  Location: [facility] ORS;  Service: Gynecology;  Laterality: N/A;   TEE WITHOUT CARDIOVERSION N/A 04/23/2020   Procedure: TRANSESOPHAGEAL ECHOCARDIOGRAM (TEE);  Surgeon: Laurey Morale, MD;  Location: [facility] ENDOSCOPY;  Service: Cardiovascular;  Laterality: N/A;   TONSILLECTOMY     Patient Active Problem List   Diagnosis Date Noted   Cerebrovascular accident (CVA) (HCC) 12/05/2021   Acute CVA (cerebrovascular accident) (HCC) 12/04/2021   Trigger finger, right ring finger 09/10/2021   Dupuytren's contracture of both hands 09/10/2021   Vitamin D deficiency 09/10/2021   Malignant neoplasm of left breast in female, estrogen receptor positive (HCC) 09/01/2021   Dyslipidemia 05/16/2021   Major depression, recurrent, chronic (HCC) 03/13/2021   Mild protein-calorie malnutrition (HCC) 03/13/2021   Endometrial cancer (HCC) 12/26/2020   Postoperative anemia due to acute blood loss 12/19/2020   History of CVA in adulthood 04/19/2020   History of ischemic multifocal posterior circulation stroke 02/29/2020   Difficulty hearing 06/28/2015   Arthritis, degenerative 06/28/2015   Paresthesia 06/28/2015   Purpura, nonthrombopenic (HCC) 06/28/2015   Adult hypothyroidism 08/09/2014   Migraine without aura and without  status migrainosus, not intractable 09/19/2008   Moderate major depression (HCC) 09/07/2006   OP (osteoporosis) 09/07/2006    ONSET DATE: 04/19/20  REFERRING DIAG: N82.95 (ICD-10-CM) - Personal history of transient ischemic attack (TIA), and cerebral infarction without residual deficits   THERAPY DIAG:  Unsteadiness on feet  Other abnormalities of gait and mobility  Difficulty in walking, not elsewhere classified  Abnormality of gait and mobility  Rationale for Evaluation and Treatment: Rehabilitation  SUBJECTIVE:  SUBJECTIVE STATEMENT:  Patient reporting feeling good. Wants to go further in depth in well zone activities.    PERTINENT HISTORY: Pt is a 72 year old female with history of multiple strokes, migraine, "Subjective versus Mild Cognitive Impairment (word finding difficulty, short-term recall), in patient with positive family history of Alzheimer's Disease, elevated p-tau, unremarkable SNFL," moderate non-positional obstructive sleep apnea, subjective upper extremity weakness, slight ataxia, imbalance, decreased sensation to bilateral lower extremity, hyperreflexia as well as chemo radiation for endometrial cancer and recent radiation to her left breast.   PAIN:  Are you having pain? No  PRECAUTIONS: None  WEIGHT BEARING RESTRICTIONS: No  FALLS: Has patient fallen in last 6 months? Yes. Number of falls 1 fall   LIVING ENVIRONMENT:  Lives with:  Lives with DTR who drops in and drops in once in a week Lives in: House/apartment Stairs: Yes: Internal: 15 steps; on left going up and External: 5 steps; can reach both Has following equipment at home: Single point cane  PLOF: Independent  PATIENT GOALS: walking straight ( without veering), feel more comfortable walking out and about in  the park, improve walking speed.   OBJECTIVE:   TODAY'S TREATMENT:      06/09/22 TE Exercises - Nustep level 4-5 x 15 minutes   - 1 x daily - 7 x weekly - 3 sets - 10 reps - Full Leg Press  - 1 x daily - 7 x weekly - 3 sets - 10 reps - Single Leg Press  - 1 x daily - 7 x weekly - 2 sets - 10 reps - Hamstring Curl with Weight Machine  - 1 x daily - 7 x weekly - 2 sets - 10 reps - Knee Extension with Weight Machine  - 1 x daily - 7 x weekly - 2 sets - 10 reps - Heel Toe Raises with Counter Support  - 1 x daily - 7 x weekly - 3 sets - 15 reps - Squat with TRX  - 1 x daily - 7 x weekly - 1 sets - 10 reps - Seated Row Cable Machine  - 1 x daily - 7 x weekly - 2 sets - 10 reps PATIENT EDUCATION: Allow for adequate rest breaks when motor fatigue intereferes with quality work of Location manager.   HOME EXERCISE PROGRAM: Access Code: [email or web address] URL: [email or web address] Date: 06/09/2022 Prepared by: Thresa Ross  Exercises - Nustep level 4-5 x 15 minutes   - 1 x daily - 7 x weekly - 3 sets - 10 reps - Full Leg Press  - 1 x daily - 7 x weekly - 3 sets - 10 reps - Single Leg Press  - 1 x daily - 7 x weekly - 2 sets - 10 reps - Hamstring Curl with Weight Machine  - 1 x daily - 7 x weekly - 2 sets - 10 reps - Knee Extension with Weight Machine  - 1 x daily - 7 x weekly - 2 sets - 10 reps - Heel Toe Raises with Counter Support  - 1 x daily - 7 x weekly - 3 sets - 15 reps - Squat with TRX  - 1 x daily - 7 x weekly - 1 sets - 10 reps - Seated Row Cable Machine  - 1 x daily - 7 x weekly - 2 sets - 10 reps GOALS: Goals reviewed with patient? Yes  SHORT TERM GOALS: Target date: 04/22/2022       Patient will be independent in home  exercise program to improve strength/mobility for better functional independence with ADLs. Baseline: No HEP currently 1/15:Pt performing every other day or every 2 days, pt still progressing with gym based program for discharge and is not  comfortable with program at this point  Goal status: Partially MET   LONG TERM GOALS: Target date: 06/17/2022      1.  Patient will increase FOTO score to equal to or greater than  55   to demonstrate statistically significant improvement in mobility and quality of life.  Baseline: 52 4/15: 58%  Goal status: MET   2.  Patient will increase Mini Best Balance score by > 4 points to demonstrate decreased fall risk during functional activities. Baseline: 19 05/19/22: 23 Goal status: MET   3.   Patient will reduce timed up and go to <11 seconds to reduce fall risk and demonstrate improved transfer/gait ability. Baseline: 14.6 sec 05/19/22: 10.6 Goal status: MET  4.   Patient will increase 10 meter walk test to >1.11m/s as to improve gait speed for better community ambulation and to reduce fall risk. Baseline: .89 m/s 05/19/22: 1.21 m/s Goal status: MET  5.   Patient will increase six minute walk test distance to >1630 feet and with no signs of imbalance for progression to super community ambulator. Baseline: 03/27/22: 1424ft, no device, intermitent drifting/sway/crossover 05/19/22:1360 ft Goal status: ONGOING     ASSESSMENT:  CLINICAL IMPRESSION:  Patient presents with good motivation for completion of physical therapy activities.  Patient introduced to various activities in the well zone she can continue to further improve her lower extremity strength and balance following discharge from physical therapy.  Patient walked through each exercise and provided with a handout listing sets, reps, as well as resistance for each exercise.  Patient still had some difficulty with performing activities and may benefit from an additional session in the well zone to ensure she is able to perform exercises properly and safely. Pt will continue to benefit from skilled physical therapy intervention to address impairments, improve QOL, and attain therapy goals.   OBJECTIVE IMPAIRMENTS: Abnormal gait,  decreased activity tolerance, decreased balance, decreased endurance, decreased mobility, difficulty walking, decreased ROM, decreased strength, and impaired perceived functional ability.   ACTIVITY LIMITATIONS: standing, squatting, stairs, and locomotion level  PARTICIPATION LIMITATIONS: cleaning, shopping, community activity, and yard work  PERSONAL FACTORS: Time since onset of injury/illness/exacerbation and 3+ comorbidities: ischemic stroke, cancer in remission, arthritis,   are also affecting patient's functional outcome.   REHAB POTENTIAL: Good  CLINICAL DECISION MAKING: Stable/uncomplicated  EVALUATION COMPLEXITY: Low  PLAN:  PT FREQUENCY: 1-2x/week  PT DURATION: 12 weeks  PLANNED INTERVENTIONS: Therapeutic exercises, Therapeutic activity, Neuromuscular re-education, Balance training, Gait training, Patient/Family education, Self Care, Joint mobilization, and Stair training  PLAN FOR NEXT SESSION: Continue to monitor vitals as appropriate, advance HEP with balance exercises, gait based balance, continue with instructing in  activities she can perform in the well zone prior to discharge  Norman Herrlich, PT 06/09/2022, 5:06 PM  5:06 PM, 06/09/22 Physical Therapist - [facility]  Outpatient Physical Therapy- Main Campus [phone]

## 2022-06-10 DIAGNOSIS — G3184 Mild cognitive impairment, so stated: Secondary | ICD-10-CM | POA: Diagnosis not present

## 2022-06-10 DIAGNOSIS — Z8669 Personal history of other diseases of the nervous system and sense organs: Secondary | ICD-10-CM | POA: Diagnosis not present

## 2022-06-12 ENCOUNTER — Ambulatory Visit: Payer: Medicare Other | Admitting: Physical Therapy

## 2022-06-12 ENCOUNTER — Encounter: Payer: Medicare Other | Admitting: Speech Pathology

## 2022-06-12 DIAGNOSIS — R2681 Unsteadiness on feet: Secondary | ICD-10-CM

## 2022-06-12 DIAGNOSIS — R269 Unspecified abnormalities of gait and mobility: Secondary | ICD-10-CM | POA: Diagnosis not present

## 2022-06-12 DIAGNOSIS — R2689 Other abnormalities of gait and mobility: Secondary | ICD-10-CM

## 2022-06-12 DIAGNOSIS — R262 Difficulty in walking, not elsewhere classified: Secondary | ICD-10-CM

## 2022-06-12 DIAGNOSIS — M6281 Muscle weakness (generalized): Secondary | ICD-10-CM

## 2022-06-12 NOTE — Therapy (Signed)
OUTPATIENT PHYSICAL THERAPY NEURO TREATMENT     Patient Name: Natalie [name] MRN: [id number] DOB:Sep 08, 1950, 72 y.o., female Today's Date: 06/12/2022   PCP: Alba Cory, MD REFERRING PROVIDER: Lonell Face, MD   END OF SESSION:  PT End of Session - 06/12/22 1351     Visit Number 16    Number of Visits 24    Date for PT Re-Evaluation 06/16/22    Authorization Type Traditional Medicare with [employer] Supplemental: VL based on certification    Authorization Time Period 03/24/22-06/16/22    Progress Note Due on Visit 20    PT Start Time 1349    PT Stop Time 1430    PT Time Calculation (min) 41 min    Equipment Utilized During Treatment Gait belt    Activity Tolerance Patient tolerated treatment well;No increased pain    Behavior During Therapy WFL for tasks assessed/performed                      Past Medical History:  Diagnosis Date   Acute bronchospasm due to viral infection 2020   Due to pollen   Acute ischemic left MCA stroke (HCC) 04/19/2020   a.) CTA head/neck 04/19/2020 --> nearly occlusive thrombus at bifurcation of a proximal M2 MCA branch approximately 6 mm from the MCA bifurcation. Subsequent occlusion of a proximal left M3 MCA branch   Allergy    Anemia    Angina at rest    Breast cancer, left (HCC) 08/21/2021   a.) stereotactic Bx 08/21/2021 --> IMC (G1, ER/PR +, Her2/neu -)   Cataract    a.) s/p extraction with IOL placement   Depressive disorder    Diverticulosis    Dyspnea    Epistaxis    Fatigue    Hearing loss    History of 2019 novel coronavirus disease (COVID-19) 12/18/2020   History of ischemic multifocal posterior circulation stroke 02/28/2020   a.) presented to ED with increasing episodes of  transient vertical binocular diplopia; MRI 02/27/2021 --> multiple punctate acute to subacute ischemic infarcts involving the cortical/subcortical aspects of both parieto-occipital regions   History of loop recorder    History of radiation  therapy    Vagina-02/11/21-03/14/21- Dr. Antony Blackbird   HLD (hyperlipidemia)    Hypothyroidism    LAFB (left anterior fascicular block)    Long term current use of antithrombotics/antiplatelets    a.) on DAPT therapy (ASA + clopidogrel)   Memory change    Migraine    Osteoarthritis    Osteoporosis    Palpitations    Premature menopause    QT prolongation    Seizures (HCC) 1994   history of mini seizures, possible migraine induced   Stage I adenocarcinoma of endometrium (HCC) 12/18/2020   a.) stage 1A; grade 1 (pT1a, pN0); b.) s/p TLH/BSO 12/18/2020 + adjuvant vaginal brachytherapy (30 Gy over 5 fractions between 02/11/2021 - 03/14/2021)   Vitamin D deficiency    Past Surgical History:  Procedure Laterality Date   ABDOMINAL HYSTERECTOMY  12/18/2020   ABLATION  2006   AXILLARY SENTINEL NODE BIOPSY Left 09/25/2021   Procedure: AXILLARY SENTINEL NODE BIOPSY;  Surgeon: Earline Mayotte, MD;  Location: [facility] ORS;  Service: General;  Laterality: Left;   BREAST BIOPSY Left 08/21/2021   Stereo Bx, X-clip; path --> IMC (G1, ER/PR +, Her2/neu -)   BREAST LUMPECTOMY WITH NEEDLE LOCALIZATION Left 09/25/2021   Procedure: BREAST LUMPECTOMY WITH NEEDLE LOCALIZATION;  Surgeon: Earline Mayotte, MD;  Location: [facility]  ORS;  Service: General;  Laterality: Left;   BUBBLE STUDY  04/23/2020   Procedure: BUBBLE STUDY;  Surgeon: Laurey Morale, MD;  Location: [facility] ENDOSCOPY;  Service: Cardiovascular;;   CATARACT EXTRACTION W/ INTRAOCULAR LENS IMPLANT Bilateral    COLONOSCOPY     COLONOSCOPY WITH PROPOFOL N/A 12/22/2018   Procedure: COLONOSCOPY WITH PROPOFOL;  Surgeon: Pasty Spillers, MD;  Location: [facility] ENDOSCOPY;  Service: Endoscopy;  Laterality: N/A;   EYE SURGERY  Spring 2018   Cataracts both eyes   HYSTEROSCOPY WITH D & C N/A 11/01/2020   Procedure: DILATATION AND CURETTAGE /HYSTEROSCOPY WITH MYOSURE;  Surgeon: Carver Fila, MD;  Location: [facility] ORS;  Service: Gynecology;  Laterality: N/A;    LAPAROTOMY  12/18/2020   Procedure: LAPAROTOMY;  Surgeon: Carver Fila, MD;  Location: [facility] ORS;  Service: Gynecology;;   LOOP RECORDER INSERTION N/A 04/23/2020   Procedure: LOOP RECORDER INSERTION;  Surgeon: Marinus Maw, MD;  Location: [facility] INVASIVE CV LAB;  Service: Cardiovascular;  Laterality: N/A;   ROBOTIC ASSISTED TOTAL HYSTERECTOMY WITH BILATERAL SALPINGO OOPHERECTOMY Bilateral 12/18/2020   Procedure: XI ROBOTIC ASSISTED TOTAL HYSTERECTOMY WITH BILATERAL SALPINGO OOPHORECTOMY;  Surgeon: Carver Fila, MD;  Location: [facility] ORS;  Service: Gynecology;  Laterality: Bilateral;   SENTINEL NODE BIOPSY N/A 12/18/2020   Procedure: SENTINEL NODE BIOPSY;  Surgeon: Carver Fila, MD;  Location: [facility] ORS;  Service: Gynecology;  Laterality: N/A;   TEE WITHOUT CARDIOVERSION N/A 04/23/2020   Procedure: TRANSESOPHAGEAL ECHOCARDIOGRAM (TEE);  Surgeon: Laurey Morale, MD;  Location: [facility] ENDOSCOPY;  Service: Cardiovascular;  Laterality: N/A;   TONSILLECTOMY     Patient Active Problem List   Diagnosis Date Noted   Cerebrovascular accident (CVA) (HCC) 12/05/2021   Acute CVA (cerebrovascular accident) (HCC) 12/04/2021   Trigger finger, right ring finger 09/10/2021   Dupuytren's contracture of both hands 09/10/2021   Vitamin D deficiency 09/10/2021   Malignant neoplasm of left breast in female, estrogen receptor positive (HCC) 09/01/2021   Dyslipidemia 05/16/2021   Major depression, recurrent, chronic (HCC) 03/13/2021   Mild protein-calorie malnutrition (HCC) 03/13/2021   Endometrial cancer (HCC) 12/26/2020   Postoperative anemia due to acute blood loss 12/19/2020   History of CVA in adulthood 04/19/2020   History of ischemic multifocal posterior circulation stroke 02/29/2020   Difficulty hearing 06/28/2015   Arthritis, degenerative 06/28/2015   Paresthesia 06/28/2015   Purpura, nonthrombopenic (HCC) 06/28/2015   Adult hypothyroidism 08/09/2014   Migraine without aura and without  status migrainosus, not intractable 09/19/2008   Moderate major depression (HCC) 09/07/2006   OP (osteoporosis) 09/07/2006    ONSET DATE: 04/19/20  REFERRING DIAG: Z61.09 (ICD-10-CM) - Personal history of transient ischemic attack (TIA), and cerebral infarction without residual deficits   THERAPY DIAG:  Unsteadiness on feet  Other abnormalities of gait and mobility  Difficulty in walking, not elsewhere classified  Abnormality of gait and mobility  Muscle weakness (generalized)  Rationale for Evaluation and Treatment: Rehabilitation  SUBJECTIVE:  SUBJECTIVE STATEMENT:  Patient reporting feeling good; no pain. Would prefer to focus on dynamic balance training on this day, since she left her HEP at home.    PERTINENT HISTORY: Pt is a 72 year old female with history of multiple strokes, migraine, "Subjective versus Mild Cognitive Impairment (word finding difficulty, short-term recall), in patient with positive family history of Alzheimer's Disease, elevated p-tau, unremarkable SNFL," moderate non-positional obstructive sleep apnea, subjective upper extremity weakness, slight ataxia, imbalance, decreased sensation to bilateral lower extremity, hyperreflexia as well as chemo radiation for endometrial cancer and recent radiation to her left breast.   PAIN:  Are you having pain? No  PRECAUTIONS: None  WEIGHT BEARING RESTRICTIONS: No  FALLS: Has patient fallen in last 6 months? Yes. Number of falls 1 fall   LIVING ENVIRONMENT:  Lives with:  Lives with DTR who drops in and drops in once in a week Lives in: House/apartment Stairs: Yes: Internal: 15 steps; on left going up and External: 5 steps; can reach both Has following equipment at home: Single point cane  PLOF: Independent  PATIENT GOALS:  walking straight ( without veering), feel more comfortable walking out and about in the park, improve walking speed.   OBJECTIVE:   TODAY'S TREATMENT:      06/12/22  PT treatment focused on dynamic balance training.   SLS 3 x 10 sec bil  Tandem stance 3 x 10 sec bil   Standing on airex: Semitandem with 1 foot on 6 inch step 3 x 10 sec bil  Foot tap on hedgehog (1 of 3 targets) 2 x 12 bil  Vertical ball toss x 15  Lateral ball toss off mirror x 15 bil   Cues for ankle strategy to prevent lateral LOB with balance tasks on airex pad CGA provided by PT for safety throughout all dynamic balance training in parallel bars.   Resisted walking in matrix cable machine:  Forward/reverse 12.5#.  x 2  Side stepping R 12.5# x 2 Side stepping L 12.5# then 7.5#  Min assist from PT for safety with cues for improved weight shift R and L and decreased speed of eccentric movement.    PATIENT EDUCATION: Allow for adequate rest breaks when motor fatigue intereferes with quality work of balance training.   HOME EXERCISE PROGRAM: Access Code: [email or web address] URL: [email or web address] Date: 06/09/2022 Prepared by: Thresa Ross  Exercises - Nustep level 4-5 x 15 minutes   - 1 x daily - 7 x weekly - 3 sets - 10 reps - Full Leg Press  - 1 x daily - 7 x weekly - 3 sets - 10 reps - Single Leg Press  - 1 x daily - 7 x weekly - 2 sets - 10 reps - Hamstring Curl with Weight Machine  - 1 x daily - 7 x weekly - 2 sets - 10 reps - Knee Extension with Weight Machine  - 1 x daily - 7 x weekly - 2 sets - 10 reps - Heel Toe Raises with Counter Support  - 1 x daily - 7 x weekly - 3 sets - 15 reps - Squat with TRX  - 1 x daily - 7 x weekly - 1 sets - 10 reps - Seated Row Cable Machine  - 1 x daily - 7 x weekly - 2 sets - 10 reps GOALS: Goals reviewed with patient? Yes  SHORT TERM GOALS: Target date: 04/22/2022       Patient will be independent in home exercise program to improve  strength/mobility for better functional independence with ADLs. Baseline: No HEP currently 1/15:Pt performing every other day or every 2 days, pt still progressing with gym based program for discharge and is not comfortable with program at this point  Goal status: Partially MET   LONG TERM GOALS: Target date: 06/17/2022      1.  Patient will increase FOTO score to equal to or greater than  55   to demonstrate statistically significant improvement in mobility and quality of life.  Baseline: 52 4/15: 58%  Goal status: MET   2.  Patient will increase Mini Best Balance score by > 4 points to demonstrate decreased fall risk during functional activities. Baseline: 19 05/19/22: 23 Goal status: MET   3.   Patient will reduce timed up and go to <11 seconds to reduce fall risk and demonstrate improved transfer/gait ability. Baseline: 14.6 sec 05/19/22: 10.6 Goal status: MET  4.   Patient will increase 10 meter walk test to >1.35m/s as to improve gait speed for better community ambulation and to reduce fall risk. Baseline: .89 m/s 05/19/22: 1.21 m/s Goal status: MET  5.   Patient will increase six minute walk test distance to >1630 feet and with no signs of imbalance for progression to super community ambulator. Baseline: 03/27/22: 1474ft, no device, intermitent drifting/sway/crossover 05/19/22:1360 ft Goal status: ONGOING     ASSESSMENT:  CLINICAL IMPRESSION:  Patient presents with good motivation for completion of physical therapy activities. Pt requesting to focus on balance and coordination on this day rather than go to [facility] for generalized strengthening because she did not bring her HEP with her to therapy today for reminders on proper completion of therex. Pt demonstrates mild weakness and instability in hight level balance tasks on unlevel surface in LLE on this day, requiring CGA and mutlimodal instruction for use of ankle strategy to correct LOB.  Pt will continue to benefit from  skilled physical therapy intervention to address impairments, improve QOL, and attain therapy goals.   OBJECTIVE IMPAIRMENTS: Abnormal gait, decreased activity tolerance, decreased balance, decreased endurance, decreased mobility, difficulty walking, decreased ROM, decreased strength, and impaired perceived functional ability.   ACTIVITY LIMITATIONS: standing, squatting, stairs, and locomotion level  PARTICIPATION LIMITATIONS: cleaning, shopping, community activity, and yard work  PERSONAL FACTORS: Time since onset of injury/illness/exacerbation and 3+ comorbidities: ischemic stroke, cancer in remission, arthritis,   are also affecting patient's functional outcome.   REHAB POTENTIAL: Good  CLINICAL DECISION MAKING: Stable/uncomplicated  EVALUATION COMPLEXITY: Low  PLAN:  PT FREQUENCY: 1-2x/week  PT DURATION: 12 weeks  PLANNED INTERVENTIONS: Therapeutic exercises, Therapeutic activity, Neuromuscular re-education, Balance training, Gait training, Patient/Family education, Self Care, Joint mobilization, and Stair training  PLAN FOR NEXT SESSION: Continue to monitor vitals as appropriate, advance HEP with balance exercises, gait based balance, continue with instructing in  activities she can perform in the well zone prior to discharge  Golden Pop, PT   2:43 PM, 06/12/22 Physical Therapist - [facility]  Outpatient Physical Therapy- Main Campus [phone]

## 2022-06-16 ENCOUNTER — Encounter: Payer: Medicare Other | Admitting: Speech Pathology

## 2022-06-16 ENCOUNTER — Ambulatory Visit: Payer: Medicare Other | Admitting: Physical Therapy

## 2022-06-16 DIAGNOSIS — R269 Unspecified abnormalities of gait and mobility: Secondary | ICD-10-CM

## 2022-06-16 DIAGNOSIS — R2681 Unsteadiness on feet: Secondary | ICD-10-CM

## 2022-06-16 DIAGNOSIS — R2689 Other abnormalities of gait and mobility: Secondary | ICD-10-CM | POA: Diagnosis not present

## 2022-06-16 DIAGNOSIS — M6281 Muscle weakness (generalized): Secondary | ICD-10-CM

## 2022-06-16 DIAGNOSIS — R262 Difficulty in walking, not elsewhere classified: Secondary | ICD-10-CM | POA: Diagnosis not present

## 2022-06-16 NOTE — Therapy (Signed)
OUTPATIENT PHYSICAL THERAPY NEURO TREATMENT/ Discharge      Patient Name: Natalie [name] MRN: [id number] DOB:11/12/1950, 72 y.o., female Today's Date: 06/16/2022   PCP: Alba Cory, MD REFERRING PROVIDER: Lonell Face, MD   END OF SESSION:  PT End of Session - 06/16/22 1610     Visit Number 17    Number of Visits 24    Date for PT Re-Evaluation 06/16/22    Authorization Type Traditional Medicare with [employer] Supplemental: VL based on certification    Authorization Time Period 03/24/22-06/16/22    Progress Note Due on Visit 20    Equipment Utilized During Treatment Gait belt    Activity Tolerance Patient tolerated treatment well;No increased pain    Behavior During Therapy WFL for tasks assessed/performed                      Past Medical History:  Diagnosis Date   Acute bronchospasm due to viral infection 2020   Due to pollen   Acute ischemic left MCA stroke (HCC) 04/19/2020   a.) CTA head/neck 04/19/2020 --> nearly occlusive thrombus at bifurcation of a proximal M2 MCA branch approximately 6 mm from the MCA bifurcation. Subsequent occlusion of a proximal left M3 MCA branch   Allergy    Anemia    Angina at rest    Breast cancer, left (HCC) 08/21/2021   a.) stereotactic Bx 08/21/2021 --> IMC (G1, ER/PR +, Her2/neu -)   Cataract    a.) s/p extraction with IOL placement   Depressive disorder    Diverticulosis    Dyspnea    Epistaxis    Fatigue    Hearing loss    History of 2019 novel coronavirus disease (COVID-19) 12/18/2020   History of ischemic multifocal posterior circulation stroke 02/28/2020   a.) presented to ED with increasing episodes of  transient vertical binocular diplopia; MRI 02/27/2021 --> multiple punctate acute to subacute ischemic infarcts involving the cortical/subcortical aspects of both parieto-occipital regions   History of loop recorder    History of radiation therapy    Vagina-02/11/21-03/14/21- Dr. Antony Blackbird   HLD  (hyperlipidemia)    Hypothyroidism    LAFB (left anterior fascicular block)    Long term current use of antithrombotics/antiplatelets    a.) on DAPT therapy (ASA + clopidogrel)   Memory change    Migraine    Osteoarthritis    Osteoporosis    Palpitations    Premature menopause    QT prolongation    Seizures (HCC) 1994   history of mini seizures, possible migraine induced   Stage I adenocarcinoma of endometrium (HCC) 12/18/2020   a.) stage 1A; grade 1 (pT1a, pN0); b.) s/p TLH/BSO 12/18/2020 + adjuvant vaginal brachytherapy (30 Gy over 5 fractions between 02/11/2021 - 03/14/2021)   Vitamin D deficiency    Past Surgical History:  Procedure Laterality Date   ABDOMINAL HYSTERECTOMY  12/18/2020   ABLATION  2006   AXILLARY SENTINEL NODE BIOPSY Left 09/25/2021   Procedure: AXILLARY SENTINEL NODE BIOPSY;  Surgeon: Earline Mayotte, MD;  Location: [facility];  Service: General;  Laterality: Left;   BREAST BIOPSY Left 08/21/2021   Stereo Bx, X-clip; path --> IMC (G1, ER/PR +, Her2/neu -)   BREAST LUMPECTOMY WITH NEEDLE LOCALIZATION Left 09/25/2021   Procedure: BREAST LUMPECTOMY WITH NEEDLE LOCALIZATION;  Surgeon: Earline Mayotte, MD;  Location: [facility];  Service: General;  Laterality: Left;   BUBBLE STUDY  04/23/2020   Procedure: BUBBLE STUDY;  Surgeon: Shirlee Latch,  Eliot Ford, MD;  Location: [facility];  Service: Cardiovascular;;   CATARACT EXTRACTION W/ INTRAOCULAR LENS IMPLANT Bilateral    COLONOSCOPY     COLONOSCOPY WITH PROPOFOL N/A 12/22/2018   Procedure: COLONOSCOPY WITH PROPOFOL;  Surgeon: Pasty Spillers, MD;  Location: [facility];  Service: Endoscopy;  Laterality: N/A;   EYE SURGERY  Spring 2018   Cataracts both eyes   HYSTEROSCOPY WITH D & C N/A 11/01/2020   Procedure: DILATATION AND CURETTAGE /HYSTEROSCOPY WITH MYOSURE;  Surgeon: Carver Fila, MD;  Location: [facility];  Service: Gynecology;  Laterality: N/A;   LAPAROTOMY  12/18/2020   Procedure: LAPAROTOMY;  Surgeon:  Carver Fila, MD;  Location: [facility];  Service: Gynecology;;   LOOP RECORDER INSERTION N/A 04/23/2020   Procedure: LOOP RECORDER INSERTION;  Surgeon: Marinus Maw, MD;  Location: [facility]ASIVE CV LAB;  Service: Cardiovascular;  Laterality: N/A;   ROBOTIC ASSISTED TOTAL HYSTERECTOMY WITH BILATERAL SALPINGO OOPHERECTOMY Bilateral 12/18/2020   Procedure: XI ROBOTIC ASSISTED TOTAL HYSTERECTOMY WITH BILATERAL SALPINGO OOPHORECTOMY;  Surgeon: Carver Fila, MD;  Location: [facility];  Service: Gynecology;  Laterality: Bilateral;   SENTINEL NODE BIOPSY N/A 12/18/2020   Procedure: SENTINEL NODE BIOPSY;  Surgeon: Carver Fila, MD;  Location: [facility];  Service: Gynecology;  Laterality: N/A;   TEE WITHOUT CARDIOVERSION N/A 04/23/2020   Procedure: TRANSESOPHAGEAL ECHOCARDIOGRAM (TEE);  Surgeon: Laurey Morale, MD;  Location: [facility] ENDOSCOPY;  Service: Cardiovascular;  Laterality: N/A;   TONSILLECTOMY     Patient Active Problem List   Diagnosis Date Noted   Cerebrovascular accident (CVA) (HCC) 12/05/2021   Acute CVA (cerebrovascular accident) (HCC) 12/04/2021   Trigger finger, right ring finger 09/10/2021   Dupuytren's contracture of both hands 09/10/2021   Vitamin D deficiency 09/10/2021   Malignant neoplasm of left breast in female, estrogen receptor positive (HCC) 09/01/2021   Dyslipidemia 05/16/2021   Major depression, recurrent, chronic (HCC) 03/13/2021   Mild protein-calorie malnutrition (HCC) 03/13/2021   Endometrial cancer (HCC) 12/26/2020   Postoperative anemia due to acute blood loss 12/19/2020   History of CVA in adulthood 04/19/2020   History of ischemic multifocal posterior circulation stroke 02/29/2020   Difficulty hearing 06/28/2015   Arthritis, degenerative 06/28/2015   Paresthesia 06/28/2015   Purpura, nonthrombopenic (HCC) 06/28/2015   Adult hypothyroidism 08/09/2014   Migraine without aura and without status migrainosus, not intractable 09/19/2008   Moderate major  depression (HCC) 09/07/2006   OP (osteoporosis) 09/07/2006    ONSET DATE: 04/19/20  REFERRING DIAG: Z61.09 (ICD-10-CM) - Personal history of transient ischemic attack (TIA), and cerebral infarction without residual deficits   THERAPY DIAG:  Unsteadiness on feet  Other abnormalities of gait and mobility  Difficulty in walking, not elsewhere classified  Abnormality of gait and mobility  Muscle weakness (generalized)  Rationale for Evaluation and Treatment: Rehabilitation  SUBJECTIVE:  SUBJECTIVE STATEMENT:  Patient reporting feeling good; no pain.  Reports she is happy with her progress with PT and happy with her ability to ambulate on various surfaces as well as with her ambulatory endurance, speed, and balance.  Patient prepared for discharge at this time and request 1 final review of gym-based progressive exercise program.  PERTINENT HISTORY: Pt is a 72 year old female with history of multiple strokes, migraine, "Subjective versus Mild Cognitive Impairment (word finding difficulty, short-term recall), in patient with positive family history of Alzheimer's Disease, elevated p-tau, unremarkable SNFL," moderate non-positional obstructive sleep apnea, subjective upper extremity weakness, slight ataxia, imbalance, decreased sensation to bilateral lower extremity, hyperreflexia as well as chemo radiation for endometrial cancer and recent radiation to her left breast.   PAIN:  Are you having pain? No  PRECAUTIONS: None  WEIGHT BEARING RESTRICTIONS: No  FALLS: Has patient fallen in last 6 months? Yes. Number of falls 1 fall   LIVING ENVIRONMENT:  Lives with:  Lives with DTR who drops in and drops in once in a week Lives in: House/apartment Stairs: Yes: Internal: 15 steps; on left going up and  External: 5 steps; can reach both Has following equipment at home: Single point cane  PLOF: Independent  PATIENT GOALS: walking straight ( without veering), feel more comfortable walking out and about in the park, improve walking speed.   OBJECTIVE:   TODAY'S TREATMENT:      06/16/22  Reviewed each exercise and patient's home exercise program but only completed 1 set of 10 in order to ensure patient's proper form and ability to navigate machines with confidence.  Completed double leg leg press, single-leg leg press, hamstring curls with weight machine, knee extension with weight machine, squat with TRX, and seated row machine in the [facility] gym.  Completed 10 reps of each of the above and provided cues for patient for proper set up as well as instruction in proper form and proper resistance for adequate muscular training. PATIENT EDUCATION: Allow for adequate rest breaks when motor fatigue intereferes with quality work of balance training.   HOME EXERCISE PROGRAM: Access Code: [email or web address] URL: [email or web address] Date: 06/09/2022 Prepared by: Thresa Ross  Exercises - Nustep level 4-5 x 15 minutes   - 1 x daily - 7 x weekly - 3 sets - 10 reps - Full Leg Press  - 1 x daily - 7 x weekly - 3 sets - 10 reps - Single Leg Press  - 1 x daily - 7 x weekly - 2 sets - 10 reps - Hamstring Curl with Weight Machine  - 1 x daily - 7 x weekly - 2 sets - 10 reps - Knee Extension with Weight Machine  - 1 x daily - 7 x weekly - 2 sets - 10 reps - Heel Toe Raises with Counter Support  - 1 x daily - 7 x weekly - 3 sets - 15 reps - Squat with TRX  - 1 x daily - 7 x weekly - 1 sets - 10 reps - Seated Row Cable Machine  - 1 x daily - 7 x weekly - 2 sets - 10 reps GOALS: Goals reviewed with patient? Yes  SHORT TERM GOALS: Target date: 04/22/2022       Patient will be independent in home exercise program to improve strength/mobility for better functional independence with  ADLs. Baseline: No HEP currently 1/15:Pt performing every other day or every 2 days, pt still progressing with gym based program for discharge and  is not comfortable with program at this point  Goal status: MET   LONG TERM GOALS: Target date: 06/17/2022      1.  Patient will increase FOTO score to equal to or greater than  55   to demonstrate statistically significant improvement in mobility and quality of life.  Baseline: 52 4/15: 58%  Goal status: MET   2.  Patient will increase Mini Best Balance score by > 4 points to demonstrate decreased fall risk during functional activities. Baseline: 19 05/19/22: 23 Goal status: MET   3.   Patient will reduce timed up and go to <11 seconds to reduce fall risk and demonstrate improved transfer/gait ability. Baseline: 14.6 sec 05/19/22: 10.6 Goal status: MET  4.   Patient will increase 10 meter walk test to >1.81m/s as to improve gait speed for better community ambulation and to reduce fall risk. Baseline: .89 m/s 05/19/22: 1.21 m/s Goal status: MET  5.   Patient will increase six minute walk test distance to >1630 feet and with no signs of imbalance for progression to super community ambulator. Baseline: 03/27/22: 1459ft, no device, intermitent drifting/sway/crossover 05/19/22:1360 ft 5/13: 1500 ft Goal status: NOT MET      ASSESSMENT:  CLINICAL IMPRESSION:   Patient presents to physical therapy for discharge note this date.  Patient has made great progress with therapy thus far and will continue to work independently with a gym-based exercise program as well as a home-based exercise program for days when she does not go to the gym.  Patient has met or nearly met all of her goals with exception of 6-minute walk test goal, with 6-minute walk test goal patient is significantly above community ambulation level and her current level of function with this test is adequate for discharge.  Patient has made great progress with therapy thus far  reducing her risk for falls as well as improving her lower extremity strength and independence with home and community based exercise.  Patient will be discharged at this time with home exercise program.  OBJECTIVE IMPAIRMENTS: Abnormal gait, decreased activity tolerance, decreased balance, decreased endurance, decreased mobility, difficulty walking, decreased ROM, decreased strength, and impaired perceived functional ability.   ACTIVITY LIMITATIONS: standing, squatting, stairs, and locomotion level  PARTICIPATION LIMITATIONS: cleaning, shopping, community activity, and yard work  PERSONAL FACTORS: Time since onset of injury/illness/exacerbation and 3+ comorbidities: ischemic stroke, cancer in remission, arthritis,   are also affecting patient's functional outcome.   REHAB POTENTIAL: Good  CLINICAL DECISION MAKING: Stable/uncomplicated  EVALUATION COMPLEXITY: Low  PLAN:  PT FREQUENCY: 1-2x/week  PT DURATION: 12 weeks  PLANNED INTERVENTIONS: Therapeutic exercises, Therapeutic activity, Neuromuscular re-education, Balance training, Gait training, Patient/Family education, Self Care, Joint mobilization, and Stair training  PLAN FOR NEXT SESSION: D/C  Norman Herrlich, PT   4:11 PM, 06/16/22 Physical Therapist - [facility]  Outpatient Physical Therapy- Main Campus [phone]

## 2022-06-19 ENCOUNTER — Encounter: Payer: Medicare Other | Admitting: Speech Pathology

## 2022-06-19 ENCOUNTER — Ambulatory Visit: Payer: Medicare Other

## 2022-06-23 ENCOUNTER — Ambulatory Visit: Payer: Self-pay | Admitting: Radiation Oncology

## 2022-06-23 ENCOUNTER — Ambulatory Visit
Admission: RE | Admit: 2022-06-23 | Discharge: 2022-06-23 | Disposition: A | Discharge status: Home / Self Care | Payer: Medicare Other | Source: Ambulatory Visit | Attending: Radiation Oncology | Admitting: Radiation Oncology

## 2022-06-23 ENCOUNTER — Telehealth: Payer: Self-pay | Admitting: *Deleted

## 2022-06-23 ENCOUNTER — Ambulatory Visit: Payer: Medicare Other | Admitting: Radiation Oncology

## 2022-06-23 ENCOUNTER — Ambulatory Visit: Payer: Medicare Other | Admitting: Physical Therapy

## 2022-06-23 ENCOUNTER — Encounter: Payer: Medicare Other | Admitting: Speech Pathology

## 2022-06-23 NOTE — Telephone Encounter (Signed)
CALLED PATIENT TO ASK ABOUT RESCHEDULING MISSED FU, PATIENT AGREED TO COME ON 06-26-22 @ 10:15 AM, APPT. HAS BEEN BOOKED

## 2022-06-23 NOTE — Progress Notes (Signed)
Patient states that she needs to reschedule her appointment due to going to the wrong location or her appointment . Will notify provider.

## 2022-06-25 NOTE — Progress Notes (Signed)
Radiation Oncology         [phone] ________________________________  Name: Natalie [name] MRN: [id number]  Date: 06/26/2022  DOB: 30-Oct-1950  Follow-Up Visit Note  CC: Alba Cory, MD  Carver Fila, MD  No diagnosis found.  Diagnosis: The encounter diagnosis was Endometrial cancer (HCC).   Stage 1A grade 1 endometrioid endometrial adenocarcinoma  Recently diagnosed Stage Ia (T1 aN0 M0) Left Breast invasive mammary carcinoma tubular type with low grade DCIS, ER/PR positive, Her2 negative, Grade 1, s/p lumpectomy and radiation therapy with Dr. Rushie Chestnut   Interval Since Last Radiation: 7 months and 12 days   Intent: Curative 2) Radiation treatment dates: 10/22/21 through 11/12/21 Site: Left breast Dose: 2.66 Gy/Fx delivered in 16 fractions for a total of 42.56 GY Energy: 10X-FFF/6X-FFF   Treatment Dates: 11/13/21  Site: Left Breast Boost to incision site  Energy: 9E Dose: 2 Gy/Fx delivered in 5 fractions for a total of 10 Gy   1) Radiation Treatment Dates: 02/11/2021 through 03/14/2021 Site Technique Total Dose (Gy) Dose per Fx (Gy) Completed Fx Beam Energies  Vagina: Pelvis HDR-brachy 30/30 6 5/5 Ir-192   Narrative:  The patient returns today for routine follow-up. She was last seen here for follow-up on 11/28/21. Since her last visit, the patient also met with Dr. Rushie Chestnut [facility] Rad-Onc) on 12/30/21 for follow up of radiation to the left breast. She was noted to doing well at that time overall.         In the interval since her last visit, the patient was hospitalized from 12/04/21 through 12/06/21 following a stroke. The etiology for this event remains unclear. (MRI and CT imaging of the head/brain were negative for acute CVA).          She continues to follow with [facility] medical oncology. The role of endocrine therapy was discussed during her most recent follow-up with them on 01/13/22. She ultimately opted to hold off on starting any endocrine  therapy given her recent history of stroke and ongoing work-up at the time.   During her most recent follow-up visit with Dr. Pricilla Holm on 03/21/22, the patient did report some minor memory changes since her stroke. She otherwise denied any symptoms concerning for gynecologic disease recurrence and she was noted as NED on examination. Some agglutination towards the vaginal apex was noted on exam. Dr. Pricilla Holm has counseled her of the importance of vaginal dilator use to avoid this.       The patient also presented to the ED on 04/10/22 with c/o dizziness and blurry vision. CT of the head performed demonstrated an asymmetric hypodensity in the medial aspect of the right temporal lobe likely involving the cortex, concerning for an acute infarct. This was not previously visualized on work-up for her recent stroke in November. MRI of the brain however showed no acute intracranial process and no evidence of acute or subacute infarct.   Persistent neurologic deficits from her recent stroke include speech finding difficulties and mobility limitations/unsteadiness. She began meeting with speech pathology and rehab this past February and was recently discharged from neuro rehab on 06/16/22. She will continue to meet with speech pathology.   ***  Allergies:  is allergic to sulfamethoxazole-trimethoprim and tnkase [tenecteplase].  Meds: Current Outpatient Medications  Medication Sig Dispense Refill   alendronate (FOSAMAX) 70 MG tablet TAKE 1 TABLET(70 MG) BY MOUTH 1 TIME A WEEK WITH A FULL GLASS OF WATER AND ON AN EMPTY STOMACH 4 tablet 5   aspirin 81 MG EC  tablet Take by mouth.     aspirin-acetaminophen-caffeine (EXCEDRIN MIGRAINE) 250-250-65 MG tablet Take by mouth every 6 (six) hours as needed for headache or migraine.     atorvastatin (LIPITOR) 40 MG tablet Take 1 tablet (40 mg total) by mouth daily. 90 tablet 1   buPROPion (WELLBUTRIN SR) 150 MG 12 hr tablet Take 1 tablet (150 mg total) by mouth 2 (two)  times daily. 180 tablet 1   citalopram (CELEXA) 40 MG tablet TAKE 1 TABLET BY MOUTH EVERY DAY 90 tablet 1   clopidogrel (PLAVIX) 75 MG tablet Take 1 tablet (75 mg total) by mouth daily. 1 tablet 0   Cyanocobalamin (VITAMIN B-12) 1000 MCG SUBL Place 1 tablet (1,000 mcg total) under the tongue 2 (two) times a week. 30 tablet 0   letrozole (FEMARA) 2.5 MG tablet Take 1 tablet (2.5 mg total) by mouth daily. 90 tablet 1   levothyroxine (SYNTHROID) 75 MCG tablet TAKE 1 TABLET(75 MCG) BY MOUTH DAILY BEFORE BREAKFAST 90 tablet 0   Multiple Vitamin (MULTIVITAMIN WITH MINERALS) TABS tablet Take 1 tablet by mouth daily.     polyvinyl alcohol (LIQUIFILM TEARS) 1.4 % ophthalmic solution Place 1 drop into both eyes daily as needed for dry eyes.     Ubrogepant (UBRELVY) 100 MG TABS Take 100 mg by mouth daily as needed (migraines).     Vitamin D, Ergocalciferol, (DRISDOL) 1.25 MG (50000 UNIT) CAPS capsule Take 1 capsule (50,000 Units total) by mouth every 14 (fourteen) days. 6 capsule 1   No current facility-administered medications for this encounter.    Physical Findings: The patient is in no acute distress. Patient is alert and oriented.  vitals were not taken for this visit. .  No significant changes. Lungs are clear to auscultation bilaterally. Heart has regular rate and rhythm. No palpable cervical, supraclavicular, or axillary adenopathy. Abdomen soft, non-tender, normal bowel sounds.  On pelvic examination the external genitalia were unremarkable. A speculum exam was performed. There are no mucosal lesions noted in the vaginal vault. A Pap smear was obtained of the proximal vagina. On bimanual and rectovaginal examination there were no pelvic masses appreciated. ***   Lab Findings: Lab Results  Component Value Date   WBC 4.1 04/10/2022   HGB 12.9 04/10/2022   HCT 39.1 04/10/2022   MCV 96.5 04/10/2022   PLT 196 04/10/2022    Radiographic Findings: No results found.  Impression: The encounter  diagnosis was Endometrial cancer (HCC).   Stage 1A grade 1 endometrioid endometrial adenocarcinoma  The patient is recovering from the effects of radiation.  ***  Plan:  ***   *** minutes of total time was spent for this patient encounter, including preparation, face-to-face counseling with the patient and coordination of care, physical exam, and documentation of the encounter. ____________________________________  Billie Lade, PhD, MD  This document serves as a record of services personally performed by Antony Blackbird, MD. It was created on his behalf by Neena Rhymes, a trained medical scribe. The creation of this record is based on the scribe's personal observations and the provider's statements to them. This document has been checked and approved by the attending provider.

## 2022-06-26 ENCOUNTER — Ambulatory Visit: Payer: Medicare Other | Admitting: Physical Therapy

## 2022-06-26 ENCOUNTER — Encounter: Payer: Medicare Other | Admitting: Speech Pathology

## 2022-06-26 ENCOUNTER — Encounter: Payer: Self-pay | Admitting: Radiation Oncology

## 2022-06-26 ENCOUNTER — Ambulatory Visit
Admission: RE | Admit: 2022-06-26 | Discharge: 2022-06-26 | Disposition: A | Discharge status: Home / Self Care | Payer: Medicare Other | Source: Ambulatory Visit | Attending: Radiation Oncology | Admitting: Radiation Oncology

## 2022-06-26 ENCOUNTER — Other Ambulatory Visit: Payer: Self-pay | Admitting: Family Medicine

## 2022-06-26 VITALS — BP 114/75 | HR 65 | Resp 18 | Wt 154.2 lb

## 2022-06-26 DIAGNOSIS — Z923 Personal history of irradiation: Secondary | ICD-10-CM | POA: Insufficient documentation

## 2022-06-26 DIAGNOSIS — Z79899 Other long term (current) drug therapy: Secondary | ICD-10-CM | POA: Diagnosis not present

## 2022-06-26 DIAGNOSIS — Z8673 Personal history of transient ischemic attack (TIA), and cerebral infarction without residual deficits: Secondary | ICD-10-CM | POA: Diagnosis not present

## 2022-06-26 DIAGNOSIS — E039 Hypothyroidism, unspecified: Secondary | ICD-10-CM

## 2022-06-26 DIAGNOSIS — Z853 Personal history of malignant neoplasm of breast: Secondary | ICD-10-CM | POA: Diagnosis not present

## 2022-06-26 DIAGNOSIS — R3915 Urgency of urination: Secondary | ICD-10-CM | POA: Insufficient documentation

## 2022-06-26 DIAGNOSIS — Z79890 Hormone replacement therapy (postmenopausal): Secondary | ICD-10-CM | POA: Insufficient documentation

## 2022-06-26 DIAGNOSIS — Z8542 Personal history of malignant neoplasm of other parts of uterus: Secondary | ICD-10-CM | POA: Diagnosis not present

## 2022-06-26 DIAGNOSIS — Z7902 Long term (current) use of antithrombotics/antiplatelets: Secondary | ICD-10-CM | POA: Diagnosis not present

## 2022-06-26 DIAGNOSIS — C541 Malignant neoplasm of endometrium: Secondary | ICD-10-CM

## 2022-06-26 NOTE — Progress Notes (Signed)
[name] is here today for follow up post radiation to the pelvic.  They completed their radiation on: 11/12/21  Does the patient complain of any of the following:  Pain: No Abdominal bloating: No Diarrhea/Constipation: No Nausea/Vomiting: No Vaginal Discharge: No Blood in Urine or Stool: No Urinary Issues (dysuria/incomplete emptying/ incontinence/ increased frequency/urgency):  Urinary urgency  and frequency at times.  Does patient report using vaginal dilator 2-3 times a week and/or sexually active 2-3 weeks: Yes, uses dilators at times.  Post radiation skin changes: No   Additional comments if applicable:    BP 114/75   Pulse 65   Resp 18   Wt 154 lb 3.2 oz (69.9 kg)   SpO2 99%   BMI 25.66 kg/m

## 2022-07-01 ENCOUNTER — Encounter: Payer: Medicare Other | Admitting: Speech Pathology

## 2022-07-01 ENCOUNTER — Ambulatory Visit: Payer: Medicare Other | Admitting: Physical Therapy

## 2022-07-02 ENCOUNTER — Encounter: Payer: Self-pay | Admitting: Radiation Oncology

## 2022-07-02 ENCOUNTER — Ambulatory Visit
Admission: RE | Admit: 2022-07-02 | Discharge: 2022-07-02 | Disposition: A | Discharge status: Home / Self Care | Payer: Medicare Other | Source: Ambulatory Visit | Attending: Radiation Oncology | Admitting: Radiation Oncology

## 2022-07-02 VITALS — BP 134/83 | HR 70 | Temp 97.5°F | Resp 16 | Wt 149.0 lb

## 2022-07-02 DIAGNOSIS — Z170 Estrogen receptor positive status [ER+]: Secondary | ICD-10-CM | POA: Diagnosis not present

## 2022-07-02 DIAGNOSIS — Z8542 Personal history of malignant neoplasm of other parts of uterus: Secondary | ICD-10-CM | POA: Diagnosis not present

## 2022-07-02 DIAGNOSIS — Z923 Personal history of irradiation: Secondary | ICD-10-CM | POA: Diagnosis not present

## 2022-07-02 DIAGNOSIS — Z8673 Personal history of transient ischemic attack (TIA), and cerebral infarction without residual deficits: Secondary | ICD-10-CM | POA: Diagnosis not present

## 2022-07-02 DIAGNOSIS — C50912 Malignant neoplasm of unspecified site of left female breast: Secondary | ICD-10-CM

## 2022-07-02 NOTE — Progress Notes (Signed)
Radiation Oncology Follow up Note  Name: Natalie [name]   Date:   07/02/2022 MRN:  [id number] DOB: 10-Jan-1951    This 72 y.o. female presents to the clinic today for 12-month follow-up status post whole breast radiation to her left breast for stage Ia invasive mammary carcinoma ER positive.  REFERRING PROVIDER: Alba Cory, MD  HPI: Patient is a 72 year old female now out 7 months having completed whole breast radiation to her left breast for stage Ia invasive mammary carcinoma.  Seen today in routine follow-up she is doing well.  Specifically denies breast tenderness cough or bone pain.  She has not yet had a mammogram..  Patient has had vaginal brachytherapy performed at [facility] is also status post a CVA.  She has declined endocrine therapy.  COMPLICATIONS OF TREATMENT: none  FOLLOW UP COMPLIANCE: keeps appointments   PHYSICAL EXAM:  BP 134/83   Pulse 70   Temp (!) 97.5 F (36.4 C) (Tympanic)   Resp 16   Wt 149 lb (67.6 kg)   BMI 24.79 kg/m  Lungs are clear to A&P cardiac examination essentially unremarkable with regular rate and rhythm. No dominant mass or nodularity is noted in either breast in 2 positions examined. Incision is well-healed. No axillary or supraclavicular adenopathy is appreciated. Cosmetic result is excellent.  Well-developed well-nourished patient in NAD. HEENT reveals PERLA, EOMI, discs not visualized.  Oral cavity is clear. No oral mucosal lesions are identified. Neck is clear without evidence of cervical or supraclavicular adenopathy. Lungs are clear to A&P. Cardiac examination is essentially unremarkable with regular rate and rhythm without murmur rub or thrill. Abdomen is benign with no organomegaly or masses noted. Motor sensory and DTR levels are equal and symmetric in the upper and lower extremities. Cranial nerves II through XII are grossly intact. Proprioception is intact. No peripheral adenopathy or edema is identified. No motor or sensory levels are  noted. Crude visual fields are within normal range.  RADIOLOGY RESULTS: No current films for review.     PLAN: At the present time patient is doing well with no evidence of disease as far as breast cancer is concerned.  She continues follow-up with medical oncology.  I have asked to see her back in 1 year for follow-up.  Patient is to call with any concerns.  I would like to take this opportunity to thank you for allowing me to participate in the care of your patient.Natalie Miller, MD

## 2022-07-03 ENCOUNTER — Encounter: Payer: Medicare Other | Admitting: Speech Pathology

## 2022-07-03 ENCOUNTER — Ambulatory Visit: Payer: Medicare Other

## 2022-07-08 ENCOUNTER — Encounter: Payer: Medicare Other | Admitting: Speech Pathology

## 2022-07-08 ENCOUNTER — Ambulatory Visit: Payer: Medicare Other | Admitting: Physical Therapy

## 2022-07-10 ENCOUNTER — Encounter: Payer: Medicare Other | Admitting: Speech Pathology

## 2022-07-10 ENCOUNTER — Ambulatory Visit: Payer: Medicare Other | Admitting: Physical Therapy

## 2022-07-14 ENCOUNTER — Ambulatory Visit
Admission: RE | Admit: 2022-07-14 | Discharge: 2022-07-14 | Disposition: A | Discharge status: Home / Self Care | Payer: Medicare Other | Source: Ambulatory Visit | Attending: Nurse Practitioner | Admitting: Nurse Practitioner

## 2022-07-14 DIAGNOSIS — C50112 Malignant neoplasm of central portion of left female breast: Secondary | ICD-10-CM

## 2022-07-14 DIAGNOSIS — R92323 Mammographic fibroglandular density, bilateral breasts: Secondary | ICD-10-CM | POA: Diagnosis not present

## 2022-07-14 DIAGNOSIS — Z170 Estrogen receptor positive status [ER+]: Secondary | ICD-10-CM | POA: Diagnosis not present

## 2022-07-14 DIAGNOSIS — Z853 Personal history of malignant neoplasm of breast: Secondary | ICD-10-CM | POA: Diagnosis not present

## 2022-07-14 HISTORY — DX: Personal history of irradiation: Z92.3

## 2022-07-15 ENCOUNTER — Telehealth: Payer: Self-pay | Admitting: *Deleted

## 2022-07-15 ENCOUNTER — Encounter: Payer: Medicare Other | Admitting: Speech Pathology

## 2022-07-15 ENCOUNTER — Ambulatory Visit: Payer: Medicare Other | Admitting: Physical Therapy

## 2022-07-15 NOTE — Telephone Encounter (Signed)
Shirley from radiation called and scheduled the patient for a follow up appt with  Dr Pricilla Holm on 8/2 at 2:45 pm.  Appt scheduled and Talbert Forest will contact the patient with the appt date/time.

## 2022-07-15 NOTE — Telephone Encounter (Signed)
Called patient to inform of fu appt. with Dr. Pricilla Holm on 09-05-22- arrival time- 2:30 pm, spoke with patient and she is aware of this appt.

## 2022-07-17 ENCOUNTER — Encounter: Payer: Medicare Other | Admitting: Speech Pathology

## 2022-07-17 ENCOUNTER — Ambulatory Visit: Payer: Medicare Other | Admitting: Physical Therapy

## 2022-07-21 ENCOUNTER — Encounter: Payer: Self-pay | Admitting: Oncology

## 2022-07-21 ENCOUNTER — Inpatient Hospital Stay: Payer: Medicare Other | Attending: Gynecologic Oncology | Admitting: Oncology

## 2022-07-21 VITALS — BP 125/82 | HR 62 | Temp 98.1°F | Resp 16 | Ht 65.0 in | Wt 149.0 lb

## 2022-07-21 DIAGNOSIS — C50212 Malignant neoplasm of upper-inner quadrant of left female breast: Secondary | ICD-10-CM | POA: Diagnosis not present

## 2022-07-21 DIAGNOSIS — Z7982 Long term (current) use of aspirin: Secondary | ICD-10-CM | POA: Diagnosis not present

## 2022-07-21 DIAGNOSIS — Z7983 Long term (current) use of bisphosphonates: Secondary | ICD-10-CM | POA: Insufficient documentation

## 2022-07-21 DIAGNOSIS — M810 Age-related osteoporosis without current pathological fracture: Secondary | ICD-10-CM | POA: Diagnosis not present

## 2022-07-21 DIAGNOSIS — Z08 Encounter for follow-up examination after completed treatment for malignant neoplasm: Secondary | ICD-10-CM | POA: Diagnosis not present

## 2022-07-21 DIAGNOSIS — Z8542 Personal history of malignant neoplasm of other parts of uterus: Secondary | ICD-10-CM | POA: Insufficient documentation

## 2022-07-21 DIAGNOSIS — Z8673 Personal history of transient ischemic attack (TIA), and cerebral infarction without residual deficits: Secondary | ICD-10-CM | POA: Diagnosis not present

## 2022-07-21 DIAGNOSIS — Z7902 Long term (current) use of antithrombotics/antiplatelets: Secondary | ICD-10-CM | POA: Diagnosis not present

## 2022-07-21 DIAGNOSIS — Z853 Personal history of malignant neoplasm of breast: Secondary | ICD-10-CM

## 2022-07-21 DIAGNOSIS — Z170 Estrogen receptor positive status [ER+]: Secondary | ICD-10-CM | POA: Diagnosis not present

## 2022-07-21 DIAGNOSIS — Z79811 Long term (current) use of aromatase inhibitors: Secondary | ICD-10-CM

## 2022-07-22 ENCOUNTER — Encounter: Payer: Self-pay | Admitting: *Deleted

## 2022-07-22 ENCOUNTER — Ambulatory Visit: Payer: Medicare Other | Admitting: Physical Therapy

## 2022-07-22 ENCOUNTER — Encounter: Payer: Medicare Other | Admitting: Speech Pathology

## 2022-07-22 NOTE — Progress Notes (Signed)
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  Diagnostic mammogram and ultrasound showed indeterminate left breast asymmetry as well as a probable benign right-sided breast mass.  Biopsy of both the masses was recommended.  However the second mass resolved at the time of biopsy likely consistent with fat necrosis from anticoagulate use.  Left breast mass biopsy showed invasive mammary carcinoma grade 1 ER greater than 90% positive, PR negative and HER2 negative.   Personal history of endometrial cancer in November 2022 s/p surgery and adjuvant radiation therapy.  Also had a posterior circulation stroke in January 2022 for which she is on dual antiplatelet therapy.   Final pathology showed a 5 mm tubular carcinoma grade 1 ER greater than 90% positive PR 10% positive and HER2 negative.  Negative margins.  3 sentinel lymph nodes negative for malignancy.  PT1AN0.  Patient has baseline osteoporosis.  Tamoxifen was not started given her history of TIAs and patient started taking letrozole in March 2024.   Interval history-patient is tolerating letrozole  along with calcium and vitamin D well without any significant side effects.  She has not had any recurrent TIAs.  She is on aspirin and Plavix for her stroke.  ECOG PS- 1 Pain scale- 0   Review of systems- Review of Systems  Constitutional:  Negative for chills, fever, malaise/fatigue and weight loss.  HENT:  Negative for congestion, ear discharge and nosebleeds.   Eyes:  Negative for blurred vision.  Respiratory:  Negative for cough, hemoptysis, sputum production, shortness of breath and wheezing.   Cardiovascular:  Negative for chest pain, palpitations, orthopnea and claudication.  Gastrointestinal:  Negative for abdominal pain, blood in stool, constipation, diarrhea, heartburn, melena, nausea and vomiting.  Genitourinary:  Negative for dysuria, flank pain, frequency, hematuria and urgency.  Musculoskeletal:  Negative for back pain, joint pain and myalgias.  Skin:  Negative for rash.  Neurological:  Negative for dizziness, tingling, focal weakness, seizures, weakness and headaches.  Endo/Heme/Allergies:  Does not bruise/bleed easily.  Psychiatric/Behavioral:  Negative for depression and suicidal ideas. The patient does not have insomnia.       Allergies  Allergen Reactions   Sulfamethoxazole-Trimethoprim Hives   Tnkase [Tenecteplase] Swelling    Mild focal left upper lip swelling     Past Medical History:  Diagnosis Date   Acute bronchospasm due to viral infection 2020   Due to pollen   Acute ischemic left MCA stroke (HCC) 04/19/2020   a.) CTA head/neck 04/19/2020 --> nearly occlusive thrombus at bifurcation of a proximal M2 MCA branch approximately 6 mm from the MCA bifurcation. Subsequent occlusion of a proximal left M3 MCA branch   Allergy    Anemia    Angina at rest    Breast cancer, left (HCC)  08/21/2021   a.) stereotactic Bx 08/21/2021 --> IMC (G1, ER/PR +, Her2/neu -)   Cataract    a.) s/p extraction with IOL placement   Depressive disorder    Diverticulosis     Dyspnea    Epistaxis    Fatigue    Hearing loss    History of 2019 novel coronavirus disease (COVID-19) 12/18/2020   History of ischemic multifocal posterior circulation stroke 02/28/2020   a.) presented to ED with increasing episodes of  transient vertical binocular diplopia; MRI 02/27/2021 --> multiple punctate acute to subacute ischemic infarcts involving the cortical/subcortical aspects of both parieto-occipital regions   History of loop recorder    History of radiation therapy    Vagina-02/11/21-03/14/21- Dr. 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/18/2020   a.) stage 1A; grade 1 (pT1a, pN0); b.) s/p TLH/BSO 12/18/2020 + adjuvant vaginal brachytherapy (30 Gy over 5 fractions between 02/11/2021 - 03/14/2021)   Vitamin D deficiency      Past Surgical History:  Procedure Laterality Date   ABDOMINAL HYSTERECTOMY  12/18/2020   ABLATION  2006   AXILLARY SENTINEL NODE BIOPSY Left 09/25/2021   Procedure: AXILLARY SENTINEL NODE BIOPSY;  Surgeon: Earline Mayotte, MD;  Location: [facility] ORS;  Service: General;  Laterality: Left;   BREAST BIOPSY Left 08/21/2021   Stereo Bx, X-clip; path --> IMC (G1, ER/PR +, Her2/neu -)   BREAST LUMPECTOMY Left 09/25/2021   BREAST LUMPECTOMY WITH NEEDLE LOCALIZATION Left 09/25/2021   Procedure: BREAST LUMPECTOMY WITH NEEDLE LOCALIZATION;  Surgeon: Earline Mayotte, MD;  Location: [facility];  Service: General;  Laterality: Left;   BUBBLE STUDY  04/23/2020   Procedure: BUBBLE STUDY;  Surgeon: Laurey Morale, MD;   Location: [facility] ENDOSCOPY;  Service: Cardiovascular;;   CATARACT EXTRACTION W/ INTRAOCULAR LENS IMPLANT Bilateral    COLONOSCOPY     COLONOSCOPY WITH PROPOFOL N/A 12/22/2018   Procedure: COLONOSCOPY WITH PROPOFOL;  Surgeon: Pasty Spillers, MD;  Location: [facility] ENDOSCOPY;  Service: Endoscopy;  Laterality: N/A;   EYE SURGERY  Spring 2018   Cataracts both eyes   HYSTEROSCOPY WITH D & C N/A 11/01/2020   Procedure: DILATATION AND CURETTAGE /HYSTEROSCOPY WITH MYOSURE;  Surgeon: Carver Fila, MD;  Location: [facility];  Service: Gynecology;  Laterality: N/A;   LAPAROTOMY  12/18/2020   Procedure: LAPAROTOMY;  Surgeon: Carver Fila, MD;  Location: [facility];  Service: Gynecology;;   LOOP RECORDER INSERTION N/A 04/23/2020   Procedure: LOOP RECORDER INSERTION;  Surgeon: Marinus Maw, MD;  Location: [facility] INVASIVE CV LAB;  Service: Cardiovascular;  Laterality: N/A;   ROBOTIC ASSISTED TOTAL HYSTERECTOMY WITH BILATERAL SALPINGO OOPHERECTOMY Bilateral 12/18/2020   Procedure: XI ROBOTIC ASSISTED TOTAL HYSTERECTOMY WITH BILATERAL SALPINGO OOPHORECTOMY;  Surgeon: Carver Fila, MD;  Location: [facility];  Service: Gynecology;  Laterality: Bilateral;   SENTINEL NODE BIOPSY N/A 12/18/2020   Procedure: SENTINEL NODE BIOPSY;  Surgeon: Carver Fila, MD;  Location: [facility];  Service: Gynecology;  Laterality: N/A;   TEE WITHOUT CARDIOVERSION N/A 04/23/2020   Procedure: TRANSESOPHAGEAL ECHOCARDIOGRAM (TEE);  Surgeon: Laurey Morale, MD;  Location: [facility] ENDOSCOPY;  Service: Cardiovascular;  Laterality: N/A;   TONSILLECTOMY      Social History   Socioeconomic History   Marital status: Single    Spouse name: Not on file   Number of children: 1   Years of education: Not on file   Highest education level: Bachelor's degree (e.g., BA, AB, BS)  Occupational History   Occupation: retired   Tobacco Use   Smoking status: Former    Packs/day: 0.50    Years: 6.00    Additional pack years: 0.00     Total pack years: 3.00    Types: Cigarettes    Quit date: 02/11/1974    Years since quitting: 48.4   Smokeless tobacco: Never  Vaping Use   Vaping Use: Never used  Substance and Sexual Activity   Alcohol use: Not Currently   Drug use: Never   Sexual activity: Not Currently    Comment: Don't use not sexually active  Other Topics Concern   Not on file  Social History Narrative   Raised an adopted child on her own   Working part time reviewed documents   Social Determinants of Health   Financial Resource Strain: Low Risk  (04/03/2022)   Overall Financial Resource Strain (CARDIA)    Difficulty of Paying Living Expenses: Not very hard  Food Insecurity: No Food Insecurity (04/03/2022)   Hunger Vital Sign    Worried About Running Out of Food in the Last Year: Never true    Ran Out of Food in the Last Year: Never true  Transportation Needs: No Transportation Needs (04/03/2022)   PRAPARE - Administrator, Civil Service (Medical): No    Lack of Transportation (Non-Medical): No  Physical Activity: Insufficiently Active (04/03/2022)   Exercise Vital Sign    Days of Exercise per Week: 2 days    Minutes of Exercise per Session: 30 min  Stress: No Stress Concern Present (04/03/2022)   [employer] of Occupational Health - Occupational Stress Questionnaire    Feeling of Stress : Not at all  Social Connections: Moderately Integrated (04/03/2022)   Social Connection and Isolation Panel [NHANES]    Frequency of Communication with Friends and Family: Three times a week    Frequency of Social Gatherings with Friends and Family: More than three times a week    Attends Religious Services: More than 4 times per year    Active Member of [employer] or Organizations: Yes    Attends Engineer, structural: More than 4 times per year    Marital Status: Never married  Intimate Partner Violence: Not At Risk (04/03/2022)   Humiliation, Afraid, Rape, and Kick questionnaire    Fear of Current  or Ex-Partner: No    Emotionally Abused: No    Physically Abused: No    Sexually Abused: No    Family History  Problem Relation Age of Onset   Early death Father        suicide   Depression Father    Diabetes Father    Cancer Father        prostate   Hearing loss Father        due to war   Alzheimer's disease Mother    Heart disease Brother    Atrial fibrillation Brother    Heart disease Maternal Aunt    Dementia Maternal Aunt  Heart disease Maternal Uncle    Dementia Maternal Grandmother    Heart disease Brother    Atrial fibrillation Brother    Colon cancer Neg Hx    Breast cancer Neg Hx    Ovarian cancer Neg Hx    Pancreatic cancer Neg Hx    Endometrial cancer Neg Hx      Current Outpatient Medications:    alendronate (FOSAMAX) 70 MG tablet, TAKE 1 TABLET(70 MG) BY MOUTH 1 TIME A WEEK WITH A FULL GLASS OF WATER AND ON AN EMPTY STOMACH, Disp: 4 tablet, Rfl: 5   aspirin 81 MG EC tablet, Take by mouth., Disp: , Rfl:    aspirin-acetaminophen-caffeine (EXCEDRIN MIGRAINE) 250-250-65 MG tablet, Take by mouth every 6 (six) hours as needed for headache or migraine., Disp: , Rfl:    atorvastatin (LIPITOR) 40 MG tablet, Take 1 tablet (40 mg total) by mouth daily., Disp: 90 tablet, Rfl: 1   buPROPion (WELLBUTRIN SR) 150 MG 12 hr tablet, Take 1 tablet (150 mg total) by mouth 2 (two) times daily., Disp: 180 tablet, Rfl: 1   citalopram (CELEXA) 40 MG tablet, TAKE 1 TABLET BY MOUTH EVERY DAY, Disp: 90 tablet, Rfl: 1   clopidogrel (PLAVIX) 75 MG tablet, Take 1 tablet (75 mg total) by mouth daily., Disp: 1 tablet, Rfl: 0   Cyanocobalamin (VITAMIN B-12) 1000 MCG SUBL, Place 1 tablet (1,000 mcg total) under the tongue 2 (two) times a week., Disp: 30 tablet, Rfl: 0   letrozole (FEMARA) 2.5 MG tablet, Take 1 tablet (2.5 mg total) by mouth daily., Disp: 90 tablet, Rfl: 1   levothyroxine (SYNTHROID) 75 MCG tablet, TAKE 1 TABLET(75 MCG) BY MOUTH DAILY BEFORE BREAKFAST, Disp: 90 tablet, Rfl:  0   Multiple Vitamin (MULTIVITAMIN WITH MINERALS) TABS tablet, Take 1 tablet by mouth daily., Disp: , Rfl:    polyvinyl alcohol (LIQUIFILM TEARS) 1.4 % ophthalmic solution, Place 1 drop into both eyes daily as needed for dry eyes., Disp: , Rfl:    Ubrogepant (UBRELVY) 100 MG TABS, Take 100 mg by mouth daily as needed (migraines)., Disp: , Rfl:    Vitamin D, Ergocalciferol, (DRISDOL) 1.25 MG (50000 UNIT) CAPS capsule, Take 1 capsule (50,000 Units total) by mouth every 14 (fourteen) days., Disp: 6 capsule, Rfl: 1  Physical exam:  Vitals:   07/21/22 1313  BP: 125/82  Pulse: 62  Resp: 16  Temp: 98.1 F (36.7 C)  TempSrc: Tympanic  SpO2: 99%  Weight: 149 lb (67.6 kg)  Height: 5\' 5"  (1.651 m)   Physical Exam Cardiovascular:     Rate and Rhythm: Normal rate and regular rhythm.     Heart sounds: Normal heart sounds.  Pulmonary:     Effort: Pulmonary effort is normal.     Breath sounds: Normal breath sounds.  Abdominal:     General: Bowel sounds are normal.     Palpations: Abdomen is soft.  Skin:    General: Skin is warm and dry.  Neurological:     Mental Status: She is alert and oriented to person, place, and time.    Breast exam deferred today     Latest Ref Rng & Units 04/10/2022    4:30 PM  CMP  Glucose 70 - 99 mg/dL 83   BUN 8 - 23 mg/dL 20   Creatinine 1.19 - 1.00 mg/dL 1.47   Sodium 829 - 562 mmol/L 136   Potassium 3.5 - 5.1 mmol/L 4.1   Chloride 98 - 111 mmol/L 105   CO2  22 - 32 mmol/L 25   Calcium 8.9 - 10.3 mg/dL 8.9   Total Protein 6.5 - 8.1 g/dL 6.9   Total Bilirubin 0.3 - 1.2 mg/dL 1.1   Alkaline Phos 38 - 126 U/L 57   AST 15 - 41 U/L 28   ALT 0 - 44 U/L 27       Latest Ref Rng & Units 04/10/2022    4:30 PM  CBC  WBC 4.0 - 10.5 K/uL 4.1   Hemoglobin 12.0 - 15.0 g/dL 16.1   Hematocrit 09.6 - 46.0 % 39.1   Platelets 150 - 400 K/uL 196     No images are attached to the encounter.  MM DIAG BREAST TOMO BILATERAL  Result Date: 07/14/2022 CLINICAL DATA:   Status post LEFT lumpectomy August 2023 with radiation. Patient is on letrozole. EXAM: DIGITAL DIAGNOSTIC BILATERAL MAMMOGRAM WITH TOMOSYNTHESIS TECHNIQUE: Bilateral digital diagnostic mammography and breast tomosynthesis was performed. COMPARISON:  Previous exam(s). ACR Breast Density Category b: There are scattered areas of fibroglandular density. FINDINGS: There is density and architectural distortion within the LEFT breast, consistent with postsurgical changes. These are new in comparison to prior. No suspicious mass, distortion, or microcalcifications are identified to suggest presence of malignancy. IMPRESSION: No mammographic evidence of malignancy bilaterally. RECOMMENDATION: Recommend bilateral diagnostic mammogram (with RIGHT and LEFT breast ultrasound if deemed necessary) in 1 year I have discussed the findings and recommendations with the patient. If applicable, a reminder letter will be sent to the patient regarding the next appointment. BI-RADS CATEGORY  2: Benign. Electronically Signed   By: Meda Klinefelter M.D.   On: 07/14/2022 10:21    Assessment and plan- Patient is a 72 y.o. female with stage Ia invasive mammary carcinoma/tubular carcinoma of the left breast pT1 aN0 M0 ER/PR positive HER2 negative.  She is here for routine follow-up of breast cancer  Clinically patient is doing well with no concerning signs and symptoms of recurrence based on today's exam.  She is tolerating letrozole along with calcium and vitamin D well without any significant side effects.  She does have baseline osteoporosis and takes weekly Fosamax.  I have reiterated that Fosamax should be taken on an empty stomach first thing in the morning and should not be combined with any other medications for an hour and patient should take it in an upright position without laying down for 1 hour after taking the medication.  Patient had a mammogram this month which was unremarkable as well as breast exam by Dr. Tonna Boehringer today  and therefore I am deferring breast exam today.  I will see her back in 6 months no labs   Visit Diagnosis 1. Encounter for follow-up surveillance of breast cancer   2. Use of letrozole (Femara)   3. Age-related osteoporosis without current pathological fracture      Dr. Owens Shark, MD, MPH [facility] [phone] 07/22/2022 8:11 AM

## 2022-07-23 ENCOUNTER — Ambulatory Visit: Payer: Medicare Other | Admitting: Oncology

## 2022-07-24 ENCOUNTER — Encounter: Payer: Medicare Other | Admitting: Speech Pathology

## 2022-07-24 ENCOUNTER — Ambulatory Visit: Payer: Medicare Other

## 2022-07-29 ENCOUNTER — Ambulatory Visit: Payer: Medicare Other | Admitting: Physical Therapy

## 2022-07-29 ENCOUNTER — Encounter: Payer: Medicare Other | Admitting: Speech Pathology

## 2022-07-31 ENCOUNTER — Encounter: Payer: Medicare Other | Admitting: Speech Pathology

## 2022-07-31 ENCOUNTER — Ambulatory Visit: Payer: Medicare Other

## 2022-08-05 ENCOUNTER — Encounter: Payer: Medicare Other | Admitting: Speech Pathology

## 2022-08-05 ENCOUNTER — Ambulatory Visit: Payer: Medicare Other | Admitting: Physical Therapy

## 2022-08-12 ENCOUNTER — Ambulatory Visit: Payer: Medicare Other

## 2022-08-12 ENCOUNTER — Encounter: Payer: Medicare Other | Admitting: Speech Pathology

## 2022-08-14 ENCOUNTER — Ambulatory Visit: Payer: Medicare Other

## 2022-08-14 ENCOUNTER — Encounter: Payer: Medicare Other | Admitting: Speech Pathology

## 2022-08-19 ENCOUNTER — Ambulatory Visit: Payer: Medicare Other | Admitting: Physical Therapy

## 2022-08-19 ENCOUNTER — Encounter: Payer: Medicare Other | Admitting: Speech Pathology

## 2022-08-21 ENCOUNTER — Ambulatory Visit: Payer: Medicare Other | Admitting: Physical Therapy

## 2022-08-21 ENCOUNTER — Encounter: Payer: Medicare Other | Admitting: Speech Pathology

## 2022-08-24 ENCOUNTER — Other Ambulatory Visit: Payer: Self-pay | Admitting: Oncology

## 2022-08-26 ENCOUNTER — Encounter: Payer: Medicare Other | Admitting: Speech Pathology

## 2022-08-26 ENCOUNTER — Ambulatory Visit: Payer: Medicare Other | Admitting: Physical Therapy

## 2022-08-28 ENCOUNTER — Encounter: Payer: Medicare Other | Admitting: Speech Pathology

## 2022-08-28 ENCOUNTER — Ambulatory Visit: Payer: Medicare Other

## 2022-09-02 ENCOUNTER — Encounter: Payer: Medicare Other | Admitting: Speech Pathology

## 2022-09-02 ENCOUNTER — Ambulatory Visit: Payer: Medicare Other | Admitting: Physical Therapy

## 2022-09-02 ENCOUNTER — Encounter: Payer: Self-pay | Admitting: Gynecologic Oncology

## 2022-09-04 ENCOUNTER — Encounter: Payer: Medicare Other | Admitting: Speech Pathology

## 2022-09-04 ENCOUNTER — Ambulatory Visit: Payer: Medicare Other

## 2022-09-05 ENCOUNTER — Encounter: Payer: Self-pay | Admitting: Gynecologic Oncology

## 2022-09-05 ENCOUNTER — Other Ambulatory Visit: Payer: Self-pay

## 2022-09-05 ENCOUNTER — Inpatient Hospital Stay: Payer: Medicare Other | Attending: Gynecologic Oncology | Admitting: Gynecologic Oncology

## 2022-09-05 VITALS — BP 98/62 | HR 72 | Temp 98.2°F | Resp 18 | Ht 65.0 in | Wt 148.8 lb

## 2022-09-05 DIAGNOSIS — Z8542 Personal history of malignant neoplasm of other parts of uterus: Secondary | ICD-10-CM | POA: Diagnosis not present

## 2022-09-05 DIAGNOSIS — Z8673 Personal history of transient ischemic attack (TIA), and cerebral infarction without residual deficits: Secondary | ICD-10-CM | POA: Insufficient documentation

## 2022-09-05 DIAGNOSIS — Z90710 Acquired absence of both cervix and uterus: Secondary | ICD-10-CM | POA: Diagnosis not present

## 2022-09-05 DIAGNOSIS — C541 Malignant neoplasm of endometrium: Secondary | ICD-10-CM

## 2022-09-05 DIAGNOSIS — Z90722 Acquired absence of ovaries, bilateral: Secondary | ICD-10-CM | POA: Insufficient documentation

## 2022-09-05 DIAGNOSIS — Z08 Encounter for follow-up examination after completed treatment for malignant neoplasm: Secondary | ICD-10-CM | POA: Diagnosis not present

## 2022-09-05 DIAGNOSIS — Z923 Personal history of irradiation: Secondary | ICD-10-CM | POA: Insufficient documentation

## 2022-09-05 NOTE — Patient Instructions (Signed)
It was good to see you today.  I do not see or feel any evidence of cancer recurrence on your exam.  I will see you for follow-up in 6 months.  As always, if you develop any new and concerning symptoms before your next visit, please call to see me sooner.   

## 2022-09-05 NOTE — Progress Notes (Signed)
Gynecologic Oncology Return Clinic Visit  09/05/22  Reason for Visit: Surveillance in the setting of uterine cancer history   Treatment History: Oncology History Overview Note  MMR IHC normal   Endometrial cancer (HCC)  04/2020 Imaging   CT A/P in 04/2020 showed enlarged fibroid uterus; complex cystic mass in left ovary measuring up to 5cm. Pelvic ultrasound 05/01/20: Enlarged multi-fibroid uterus, endometrium 3.71mm. Left ovary measures up to 7cm with a 5.8 x 4.1 x 4cm dominant septated cyst.  CA-125   04/2020 Tumor Marker   Patient's tumor was tested for the following markers: CA-125. Results of the tumor marker test revealed 592.   08/10/2020 Imaging   Pelvic ultrasound showed thickened heterogenous endometrial complex with somewhat nodular margins associated with a 3.8 cm diameter fluid collection within the endometrial canal.  8.2 cm cyst of the left ovary.   08/2020 Tumor Marker   Patient's tumor was tested for the following markers: CA-125. Results of the tumor marker test revealed 731.   08/27/2020 Initial Biopsy   EMB showing minute fragments of benign inactive endometrium, mostly blood and fibrin   11/01/2020 Surgery   D&C hysteroscopy, and hysteroscopic sampling of the endometrium.    Final pathology showed at least complex atypical hyperplasia.   11/29/2020 Imaging   Pelvic ultrasound shows thickened endometrial lining measuring 4.4 cm.  Normal-appearing right ovary.  Complex left adnexal cyst measures up to f 5.3 cm.  No flow seen   12/18/2020 Surgery   TRH/BSO, SLN injection with biopsy on let (no mapping on right), mini-lap for specimen removal, repair vaginal lacerations  Findings: On EUA, large globular uterus. On intra-abdominal entry, normal upper abdominal survey.Normal omentum, small and large bowel. Uterus 10cm, very globular and bulbous at the fundus. Multiple fibroids including 3cm lower uterine segment anterior fibroid and posterior 6cm fundal fibroid. Right  adnexa normal and atrophic. Mapping to channels along the right broad ligament, no obvious SLN. On the left, mapping successful to external iliac SLN. Left ovary replaced by 6cm cyst adherent to the broad ligament and sigmoid mesentery with old blood versus necrotic tissue within (cyst wall very easily ripped open with minimal manipulation of the adnexa. Second 2cm cystic component. No obvious findings of malignancy but would be stage II if cancer given adherence to surrounding pelvic structures. Some adhesions between the bladder and cervix. No obvious adenopathy. No ascites. Mini-lap required for specimen removal given size.  No frozen sent as it was not going to impact management. Goal was to keep the surgery under or close to 2 hours.   12/18/2020 Pathology Results   A. LYMPH NODE, LEFT EXTERNAL ILIAC SENTINAL, BIOPSY:  - One lymph node, negative for malignancy (0/1).   B. UTERUS, CERVIX,  BILATERAL TUBES, HYSTERECTOMY AND BILATERAL  SALPINGECTOMY:  - Endometrium:       - Endometrioid endometrial adenocarcinoma, low-grade, arising in a  background of endometrioid intraepithelial neoplasia (EIN), with  extensive colonization of adenomyosis.       - See oncology table.  - Myometrium:       - Adenomyosis, involved by endometrioid endometrial adenocarcinoma.       - Leiomyomata uteri.  - Uterine cervix:       - Benign transformation zone.       - Negative for squamous intraepithelial lesion and malignancy.  - Fallopian tubes:       - No significant histopathologic change.  - Ovaries:       - Benign physiologic changes.   COMMENT:  A. An immunohistochemical study for pancytokeratin is negative. There is  no evidence of metastatic carcinoma.   B. Immunohistochemical studies show the tumor cells to be positive for  PR, and negative for Napsin A.  These findings would favor the above  diagnosis, and suggest against clear cell carcinoma. The definite extent  of myometrial invasion is  less than 50%, but cannot be further  characterized, due to initial sampling of the entire myometrial surface,  and extensive colonization of adenomyosis throughout the myometrium  impacting evaluation of the orientation of these fragments. Full  thickness sections are without definite direct myometrial invasion, but  show extensive adenomyosis   UTERUS, CARCINOMA OR CARCINOSARCOMA: Resection   Procedure: Total hysterectomy with bilateral salpingo-oophorectomy  Histologic Type: Endometrioid carcinoma, NOS  Histologic Grade: Low-grade  Myometrial Invasion:       Cannot be determined  Uterine Serosa Involvement: Not identified  Cervical stromal Involvement: Not identified  Extent of involvement of other tissue/organs: Not identified  Peritoneal/Ascitic Fluid: Not submitted/unknown  Lymphovascular Invasion: Not identified  Regional Lymph Nodes: Regional lymph nodes present  All regional lymph nodes negative for tumor cells       Pelvic Lymph Nodes Examined:  Sentinel: 1               Non-sentinel: 0               Total: 1       Pelvic Lymph Nodes with Metastasis: 0                          Macrometastasis: (>2.0 mm): 0                          Micrometastasis: (>0.2 mm and < 2.0 mm): 0                          Isolated Tumor Cells (<0.2 mm): 0                          Laterality of Lymph Node with Tumor: Not  applicable                             Extracapsular Extension: Not applicable    12/26/2020 Initial Diagnosis   Endometrial cancer (HCC)   02/11/2021 - 03/14/2021 Radiation Therapy   02/11/2021 through 03/14/2021 Site Technique Total Dose (Gy) Dose per Fx (Gy) Completed Fx Beam Energies  Vagina: Pelvis HDR-brachy 30/30 6 5/5 Ir-192       Malignant neoplasm of left breast in female, estrogen receptor positive (HCC)  09/01/2021 Initial Diagnosis   Malignant neoplasm of left breast in female, estrogen receptor positive (HCC)   10/02/2021 Cancer Staging   Staging form: Breast,  AJCC 8th Edition - Pathologic stage from 10/02/2021: Stage IA (pT1a, pN0, cM0, G1, ER+, PR+, HER2-) - Signed by Creig Hines, MD on 10/02/2021 Multigene prognostic tests performed: None Histologic grading system: 3 grade system     Interval History: Patient reports overall doing well.  Denies any vaginal bleeding or discharge.  Is using her vaginal dilator and once or twice since her last visit with me has noted a very small amount of blood on the dilator after using it.  Reports baseline bowel bladder function.  Denies any abdominal or pelvic pain.  She is looking forward to traveling to [place] later this month on the train for her first trip out of state in over a year.  Past Medical/Surgical History: Past Medical History:  Diagnosis Date   Acute bronchospasm due to viral infection 2020   Due to pollen   Acute ischemic left MCA stroke (HCC) 04/19/2020   a.) CTA head/neck 04/19/2020 --> nearly occlusive thrombus at bifurcation of a proximal M2 MCA branch approximately 6 mm from the MCA bifurcation. Subsequent occlusion of a proximal left M3 MCA branch   Allergy    Anemia    Angina at rest    Breast cancer, left (HCC) 08/21/2021   a.) stereotactic Bx 08/21/2021 --> IMC (G1, ER/PR +, Her2/neu -)   Cataract    a.) s/p extraction with IOL placement   Depressive disorder    Diverticulosis    Dyspnea    Epistaxis    Fatigue    Hearing loss    History of 2019 novel coronavirus disease (COVID-19) 12/18/2020   History of ischemic multifocal posterior circulation stroke 02/28/2020   a.) presented to ED with increasing episodes of  transient vertical binocular diplopia; MRI 02/27/2021 --> multiple punctate acute to subacute ischemic infarcts involving the cortical/subcortical aspects of both parieto-occipital regions   History of loop recorder    History of radiation therapy    Vagina-02/11/21-03/14/21- Dr. Antony Blackbird   HLD (hyperlipidemia)    Hypothyroidism    LAFB (left anterior  fascicular block)    Long term current use of antithrombotics/antiplatelets    a.) on DAPT therapy (ASA + clopidogrel)   Memory change    Migraine    Osteoarthritis    Osteoporosis    Palpitations    Personal history of radiation therapy    Premature menopause    QT prolongation    Seizures (HCC) 1994   history of mini seizures, possible migraine induced   Stage I adenocarcinoma of endometrium (HCC) 12/18/2020   a.) stage 1A; grade 1 (pT1a, pN0); b.) s/p TLH/BSO 12/18/2020 + adjuvant vaginal brachytherapy (30 Gy over 5 fractions between 02/11/2021 - 03/14/2021)   Vitamin D deficiency     Past Surgical History:  Procedure Laterality Date   ABDOMINAL HYSTERECTOMY  12/18/2020   ABLATION  2006   AXILLARY SENTINEL NODE BIOPSY Left 09/25/2021   Procedure: AXILLARY SENTINEL NODE BIOPSY;  Surgeon: Earline Mayotte, MD;  Location: [facility] ORS;  Service: General;  Laterality: Left;   BREAST BIOPSY Left 08/21/2021   Stereo Bx, X-clip; path --> IMC (G1, ER/PR +, Her2/neu -)   BREAST LUMPECTOMY Left 09/25/2021   BREAST LUMPECTOMY WITH NEEDLE LOCALIZATION Left 09/25/2021   Procedure: BREAST LUMPECTOMY WITH NEEDLE LOCALIZATION;  Surgeon: Earline Mayotte, MD;  Location: [facility]S;  Service: General;  Laterality: Left;   BUBBLE STUDY  04/23/2020   Procedure: BUBBLE STUDY;  Surgeon: Laurey Morale, MD;  Location: [facility] ENDOSCOPY;  Service: Cardiovascular;;   CATARACT EXTRACTION W/ INTRAOCULAR LENS IMPLANT Bilateral    COLONOSCOPY     COLONOSCOPY WITH PROPOFOL N/A 12/22/2018   Procedure: COLONOSCOPY WITH PROPOFOL;  Surgeon: Pasty Spillers, MD;  Location: [facility] ENDOSCOPY;  Service: Endoscopy;  Laterality: N/A;   EYE SURGERY  Spring 2018   Cataracts both eyes   HYSTEROSCOPY WITH D & C N/A 11/01/2020   Procedure: DILATATION AND CURETTAGE /HYSTEROSCOPY WITH MYOSURE;  Surgeon: Carver Fila, MD;  Location: [facility]S;  Service: Gynecology;  Laterality: N/A;   LAPAROTOMY  12/18/2020    Procedure:  LAPAROTOMY;  Surgeon: Carver Fila, MD;  Location: [facility];  Service: Gynecology;;   LOOP RECORDER INSERTION N/A 04/23/2020   Procedure: LOOP RECORDER INSERTION;  Surgeon: Marinus Maw, MD;  Location: [facility] INVASIVE CV LAB;  Service: Cardiovascular;  Laterality: N/A;   ROBOTIC ASSISTED TOTAL HYSTERECTOMY WITH BILATERAL SALPINGO OOPHERECTOMY Bilateral 12/18/2020   Procedure: XI ROBOTIC ASSISTED TOTAL HYSTERECTOMY WITH BILATERAL SALPINGO OOPHORECTOMY;  Surgeon: Carver Fila, MD;  Location: [facility];  Service: Gynecology;  Laterality: Bilateral;   SENTINEL NODE BIOPSY N/A 12/18/2020   Procedure: SENTINEL NODE BIOPSY;  Surgeon: Carver Fila, MD;  Location: [facility];  Service: Gynecology;  Laterality: N/A;   TEE WITHOUT CARDIOVERSION N/A 04/23/2020   Procedure: TRANSESOPHAGEAL ECHOCARDIOGRAM (TEE);  Surgeon: Laurey Morale, MD;  Location: [facility] ENDOSCOPY;  Service: Cardiovascular;  Laterality: N/A;   TONSILLECTOMY      Family History  Problem Relation Age of Onset   Early death Father        suicide   Depression Father    Diabetes Father    Cancer Father        prostate   Hearing loss Father        due to war   Alzheimer's disease Mother    Heart disease Brother    Atrial fibrillation Brother    Heart disease Maternal Aunt    Dementia Maternal Aunt    Heart disease Maternal Uncle    Dementia Maternal Grandmother    Heart disease Brother    Atrial fibrillation Brother    Colon cancer Neg Hx    Breast cancer Neg Hx    Ovarian cancer Neg Hx    Pancreatic cancer Neg Hx    Endometrial cancer Neg Hx     Social History   Socioeconomic History   Marital status: Single    Spouse name: Not on file   Number of children: 1   Years of education: Not on file   Highest education level: Bachelor's degree (e.g., BA, AB, BS)  Occupational History   Occupation: retired   Tobacco Use   Smoking status: Former    Current packs/day: 0.00    Average packs/day: 0.5  packs/day for 6.0 years (3.0 ttl pk-yrs)    Types: Cigarettes    Start date: 02/12/1968    Quit date: 02/11/1974    Years since quitting: 48.5   Smokeless tobacco: Never  Vaping Use   Vaping status: Never Used  Substance and Sexual Activity   Alcohol use: Not Currently   Drug use: Never   Sexual activity: Not Currently    Comment: Don't use not sexually active  Other Topics Concern   Not on file  Social History Narrative   Raised an adopted child on her own   Working part time reviewed documents   Social Determinants of Health   Financial Resource Strain: Low Risk  (04/03/2022)   Overall Financial Resource Strain (CARDIA)    Difficulty of Paying Living Expenses: Not very hard  Food Insecurity: No Food Insecurity (04/03/2022)   Hunger Vital Sign    Worried About Running Out of Food in the Last Year: Never true    Ran Out of Food in the Last Year: Never true  Transportation Needs: No Transportation Needs (04/03/2022)   PRAPARE - Administrator, Civil Service (Medical): No    Lack of Transportation (Non-Medical): No  Physical Activity: Insufficiently Active (04/03/2022)   Exercise Vital Sign    Days of Exercise per  Week: 2 days    Minutes of Exercise per Session: 30 min  Stress: No Stress Concern Present (04/03/2022)   [employer] of Occupational Health - Occupational Stress Questionnaire    Feeling of Stress : Not at all  Social Connections: Moderately Integrated (04/03/2022)   Social Connection and Isolation Panel [NHANES]    Frequency of Communication with Friends and Family: Three times a week    Frequency of Social Gatherings with Friends and Family: More than three times a week    Attends Religious Services: More than 4 times per year    Active Member of Clubs or Organizations: Yes    Attends Engineer, structural: More than 4 times per year    Marital Status: Never married    Current Medications:  Current Outpatient Medications:    alendronate  (FOSAMAX) 70 MG tablet, TAKE 1 TABLET(70 MG) BY MOUTH 1 TIME A WEEK WITH A FULL GLASS OF WATER AND ON AN EMPTY STOMACH, Disp: 4 tablet, Rfl: 5   aspirin 81 MG EC tablet, Take by mouth., Disp: , Rfl:    aspirin-acetaminophen-caffeine (EXCEDRIN MIGRAINE) 250-250-65 MG tablet, Take by mouth every 6 (six) hours as needed for headache or migraine., Disp: , Rfl:    atorvastatin (LIPITOR) 40 MG tablet, Take 1 tablet (40 mg total) by mouth daily., Disp: 90 tablet, Rfl: 1   buPROPion (WELLBUTRIN SR) 150 MG 12 hr tablet, Take 1 tablet (150 mg total) by mouth 2 (two) times daily., Disp: 180 tablet, Rfl: 1   calcium carbonate (OSCAL) 1500 (600 Ca) MG TABS tablet, Take 600 mg of elemental calcium by mouth 1 day or 1 dose., Disp: , Rfl:    citalopram (CELEXA) 40 MG tablet, TAKE 1 TABLET BY MOUTH EVERY DAY, Disp: 90 tablet, Rfl: 1   clopidogrel (PLAVIX) 75 MG tablet, Take 1 tablet (75 mg total) by mouth daily., Disp: 1 tablet, Rfl: 0   Cyanocobalamin (VITAMIN B-12) 1000 MCG SUBL, Place 1 tablet (1,000 mcg total) under the tongue 2 (two) times a week., Disp: 30 tablet, Rfl: 0   letrozole (FEMARA) 2.5 MG tablet, Take 1 tablet (2.5 mg total) by mouth daily., Disp: 90 tablet, Rfl: 1   levothyroxine (SYNTHROID) 75 MCG tablet, TAKE 1 TABLET(75 MCG) BY MOUTH DAILY BEFORE BREAKFAST, Disp: 90 tablet, Rfl: 0   Multiple Vitamin (MULTIVITAMIN WITH MINERALS) TABS tablet, Take 1 tablet by mouth daily., Disp: , Rfl:    polyvinyl alcohol (LIQUIFILM TEARS) 1.4 % ophthalmic solution, Place 1 drop into both eyes daily as needed for dry eyes., Disp: , Rfl:    Ubrogepant (UBRELVY) 100 MG TABS, Take 100 mg by mouth daily as needed (migraines)., Disp: , Rfl:    Vitamin D, Ergocalciferol, (DRISDOL) 1.25 MG (50000 UNIT) CAPS capsule, Take 1 capsule (50,000 Units total) by mouth every 14 (fourteen) days., Disp: 6 capsule, Rfl: 1  Review of Systems: + Fatigue, ringing in ears, dizziness. Denies appetite changes, fevers, chills, unexplained  weight changes. Denies hearing loss, neck lumps or masses, mouth sores or voice changes. Denies cough or wheezing.  Denies shortness of breath. Denies chest pain or palpitations. Denies leg swelling. Denies abdominal distention, pain, blood in stools, constipation, diarrhea, nausea, vomiting, or early satiety. Denies pain with intercourse, dysuria, frequency, hematuria or incontinence. Denies hot flashes, pelvic pain, vaginal bleeding or vaginal discharge.   Denies joint pain, back pain or muscle pain/cramps. Denies itching, rash, or wounds. Denies headaches, numbness or seizures. Denies swollen lymph nodes or glands, denies easy  bruising or bleeding. Denies anxiety, depression, confusion, or decreased concentration.  Physical Exam: BP 98/62 (BP Location: Right Arm, Patient Position: Sitting)   Pulse 72   Temp 98.2 F (36.8 C)   Resp 18   Ht 5\' 5"  (1.651 m)   Wt 148 lb 12.8 oz (67.5 kg)   SpO2 98%   BMI 24.76 kg/m  General: Alert, oriented, no acute distress. HEENT: Normocephalic, atraumatic, sclera anicteric. Chest: Clear to auscultation bilaterally.  No wheezes or rhonchi. Cardiovascular: Regular rate and rhythm, no murmurs. Abdomen: soft, nontender.  Normoactive bowel sounds.  No masses or hepatosplenomegaly appreciated.  Well-healed incisions. Extremities: Grossly normal range of motion.  Warm, well perfused.  No edema bilaterally. Skin: No rashes or lesions noted. Lymphatics: No cervical, supraclavicular, or inguinal adenopathy. GU: Normal appearing external genitalia without erythema, excoriation, or lesions.  Speculum exam reveals mildly atrophic vaginal mucosa, some radiation changes present.  Introitus itself was quite narrow and requires small speculum.  Minimal agglutination towards the apex of the vagina.  Bimanual exam reveals cuff is smooth with, no nodularity or masses.  Rectovaginal exam confirms findings.  Laboratory & Radiologic Studies: None new  Assessment &  Plan: Natalie [name] is a 72 y.o. woman with Stage 1A grade 1 endometrioid endometrial adenocarcinoma, lymph node assessment deferred.  MMR intact, MSS. Difficulty in determining invasion (estimated at <50%) and size of tumor given adenomyosis. No LVI. Given large area of myometrium involved, tumor size suspected to be larger.  Adjuvant brachytherapy was recommended which was completed in February 2023.   Patient is overall doing well and is NED on exam today. Discussed importance of vaginal dilator use to help avoid agglutination.     Per NCCN surveillance recommendations, we will plan on visits every 3 months alternating between our clinic and radiation oncology for 2 years and then visits every 6 months.  She sees Dr. Roselind Messier in November and was asked to call to schedule an appointment to see me sometime after the new year.     We reviewed signs and symptoms that would be concerning for cancer recurrence, and I encouraged the patient to call if she develops any of these.  20 minutes of total time was spent for this patient encounter, including preparation, face-to-face counseling with the patient and coordination of care, and documentation of the encounter.  Eugene Garnet, MD  Division of Gynecologic Oncology  Department of Obstetrics and Gynecology  [facility]

## 2022-09-09 ENCOUNTER — Ambulatory Visit: Payer: Medicare Other | Admitting: Physical Therapy

## 2022-09-09 ENCOUNTER — Encounter: Payer: Medicare Other | Admitting: Speech Pathology

## 2022-09-10 DIAGNOSIS — R002 Palpitations: Secondary | ICD-10-CM | POA: Diagnosis not present

## 2022-09-10 DIAGNOSIS — R270 Ataxia, unspecified: Secondary | ICD-10-CM | POA: Diagnosis not present

## 2022-09-10 DIAGNOSIS — E782 Mixed hyperlipidemia: Secondary | ICD-10-CM | POA: Diagnosis not present

## 2022-09-10 DIAGNOSIS — I10 Essential (primary) hypertension: Secondary | ICD-10-CM | POA: Diagnosis not present

## 2022-09-10 DIAGNOSIS — G4733 Obstructive sleep apnea (adult) (pediatric): Secondary | ICD-10-CM | POA: Diagnosis not present

## 2022-09-10 DIAGNOSIS — I2089 Other forms of angina pectoris: Secondary | ICD-10-CM | POA: Diagnosis not present

## 2022-09-10 DIAGNOSIS — R2681 Unsteadiness on feet: Secondary | ICD-10-CM | POA: Diagnosis not present

## 2022-09-10 DIAGNOSIS — I639 Cerebral infarction, unspecified: Secondary | ICD-10-CM | POA: Diagnosis not present

## 2022-09-10 DIAGNOSIS — R55 Syncope and collapse: Secondary | ICD-10-CM | POA: Diagnosis not present

## 2022-09-10 DIAGNOSIS — E785 Hyperlipidemia, unspecified: Secondary | ICD-10-CM | POA: Diagnosis not present

## 2022-09-10 DIAGNOSIS — R5382 Chronic fatigue, unspecified: Secondary | ICD-10-CM | POA: Diagnosis not present

## 2022-09-11 ENCOUNTER — Encounter: Payer: Medicare Other | Admitting: Speech Pathology

## 2022-09-11 ENCOUNTER — Ambulatory Visit: Payer: Medicare Other

## 2022-09-11 NOTE — Progress Notes (Signed)
Name: Natalie [name]   MRN: [id number]    DOB: 01/25/1951   Date:09/12/2022       Progress Note  Subjective  Chief Complaint  Follow Up  HPI  Recurrent strokes: first one TIA 01/22, stroke 04/2020 and another one 12/2021. She is on statin ,  Plavix and aspirin, under the care of neurologist, Dr. Sherryll Burger  . She has OSA but does not want to use CPAP, she saw dentist and is wearing a mouth piece .   Mild Cognitive Dysfunction: difficulty with word finding. She is seeing Human resources officer , she was also referred to a neuropsychologist She states when she was tired during her test she made more mistakes. She will bring me a copy   MDD: she  was under Dr. Verdie Mosher care for many years but he is retiring and she asked me to prescribe her medications from now on. she takes medications as prescribed. She is doing well , phq 9 is negative   B12 deficiency/history of vitamin D deficiency: reviewed last labs, she is only taking otc supplements now .   Hypothyroidism: she has been taking medication as prescribed, she states hair  loss resolved , she has intermittent  dry skin, no constipation . Last TSH was at goal. Continue current dose   HPV positive/High CA 125/ovarian mass diagnosed 2022  : she alternates visit with  Dr. Bertis Ruddy and after that   Dr. Eugene Garnet , treated for endometrium cancer and is now in remission . Unchanged   History of Breast cancer left : lumpectomy done 09/2021 , s/p radiation but no chemotherapy, seeing Dr. Aggie Cosier every year She also follows up with surgeon, unable to take Tamoxifen due to strokes , she is on Femara and denies side effects   History of malnutrition: she lost a lot of weight since 2022, started with diet, followed by endometrial and breast cancer, weight is finely stable.   Osteoporosis: taking Alendronate , taking calcium and vitamin D   Dupuytren's contractures: she has seen Dr. Stephenie Acres in the past and is noticing worsening of symptoms right hand, also  trigger finger of right 4th finger right side. She had some steroid injections and is stable . No progression   Patient Active Problem List   Diagnosis Date Noted   TIA on medication 09/12/2022   B12 deficiency 09/12/2022   History of malignant neoplasm of endometrium 09/12/2022   Trigger finger, right ring finger 09/10/2021   Dupuytren's contracture of both hands 09/10/2021   Vitamin D deficiency 09/10/2021   Malignant neoplasm of left breast in female, estrogen receptor positive (HCC) 09/01/2021   Dyslipidemia 05/16/2021   Major depression, recurrent, chronic (HCC) 03/13/2021   Endometrial cancer (HCC) 12/26/2020   Postoperative anemia due to acute blood loss 12/19/2020   History of CVA (cerebrovascular accident) 04/19/2020   History of ischemic multifocal posterior circulation stroke 02/29/2020   Difficulty hearing 06/28/2015   Arthritis, degenerative 06/28/2015   Purpura, nonthrombopenic (HCC) 06/28/2015   Adult hypothyroidism 08/09/2014   Migraine without aura and without status migrainosus, not intractable 09/19/2008   OP (osteoporosis) 09/07/2006    Past Surgical History:  Procedure Laterality Date   ABDOMINAL HYSTERECTOMY  12/18/2020   ABLATION  2006   AXILLARY SENTINEL NODE BIOPSY Left 09/25/2021   Procedure: AXILLARY SENTINEL NODE BIOPSY;  Surgeon: Earline Mayotte, MD;  Location: [facility] ORS;  Service: General;  Laterality: Left;   BREAST BIOPSY Left 08/21/2021   Stereo Bx, X-clip; path --> IMC (G1, ER/PR +,  Her2/neu -)   BREAST LUMPECTOMY Left 09/25/2021   BREAST LUMPECTOMY WITH NEEDLE LOCALIZATION Left 09/25/2021   Procedure: BREAST LUMPECTOMY WITH NEEDLE LOCALIZATION;  Surgeon: Earline Mayotte, MD;  Location: [facility];  Service: General;  Laterality: Left;   BUBBLE STUDY  04/23/2020   Procedure: BUBBLE STUDY;  Surgeon: Laurey Morale, MD;  Location: [facility] ENDOSCOPY;  Service: Cardiovascular;;   CATARACT EXTRACTION W/ INTRAOCULAR LENS IMPLANT Bilateral     COLONOSCOPY     COLONOSCOPY WITH PROPOFOL N/A 12/22/2018   Procedure: COLONOSCOPY WITH PROPOFOL;  Surgeon: Pasty Spillers, MD;  Location: [facility] ENDOSCOPY;  Service: Endoscopy;  Laterality: N/A;   EYE SURGERY  Spring 2018   Cataracts both eyes   HYSTEROSCOPY WITH D & C N/A 11/01/2020   Procedure: DILATATION AND CURETTAGE /HYSTEROSCOPY WITH MYOSURE;  Surgeon: Carver Fila, MD;  Location: [facility];  Service: Gynecology;  Laterality: N/A;   LAPAROTOMY  12/18/2020   Procedure: LAPAROTOMY;  Surgeon: Carver Fila, MD;  Location: [facility];  Service: Gynecology;;   LOOP RECORDER INSERTION N/A 04/23/2020   Procedure: LOOP RECORDER INSERTION;  Surgeon: Marinus Maw, MD;  Location: [facility] INVASIVE CV LAB;  Service: Cardiovascular;  Laterality: N/A;   ROBOTIC ASSISTED TOTAL HYSTERECTOMY WITH BILATERAL SALPINGO OOPHERECTOMY Bilateral 12/18/2020   Procedure: XI ROBOTIC ASSISTED TOTAL HYSTERECTOMY WITH BILATERAL SALPINGO OOPHORECTOMY;  Surgeon: Carver Fila, MD;  Location: [facility];  Service: Gynecology;  Laterality: Bilateral;   SENTINEL NODE BIOPSY N/A 12/18/2020   Procedure: SENTINEL NODE BIOPSY;  Surgeon: Carver Fila, MD;  Location: [facility];  Service: Gynecology;  Laterality: N/A;   TEE WITHOUT CARDIOVERSION N/A 04/23/2020   Procedure: TRANSESOPHAGEAL ECHOCARDIOGRAM (TEE);  Surgeon: Laurey Morale, MD;  Location: [facility] ENDOSCOPY;  Service: Cardiovascular;  Laterality: N/A;   TONSILLECTOMY      Family History  Problem Relation Age of Onset   Early death Father        suicide   Depression Father    Diabetes Father    Cancer Father        prostate   Hearing loss Father        due to war   Alzheimer's disease Mother    Heart disease Brother    Atrial fibrillation Brother    Heart disease Maternal Aunt    Dementia Maternal Aunt    Heart disease Maternal Uncle    Dementia Maternal Grandmother    Heart disease Brother    Atrial fibrillation Brother    Colon cancer Neg Hx     Breast cancer Neg Hx    Ovarian cancer Neg Hx    Pancreatic cancer Neg Hx    Endometrial cancer Neg Hx     Social History   Tobacco Use   Smoking status: Former    Current packs/day: 0.00    Average packs/day: 0.5 packs/day for 6.0 years (3.0 ttl pk-yrs)    Types: Cigarettes    Start date: 02/12/1968    Quit date: 02/11/1974    Years since quitting: 48.6   Smokeless tobacco: Never  Substance Use Topics   Alcohol use: Not Currently     Current Outpatient Medications:    alendronate (FOSAMAX) 70 MG tablet, TAKE 1 TABLET(70 MG) BY MOUTH 1 TIME A WEEK WITH A FULL GLASS OF WATER AND ON AN EMPTY STOMACH, Disp: 4 tablet, Rfl: 5   aspirin 81 MG EC tablet, Take by mouth., Disp: , Rfl:    aspirin-acetaminophen-caffeine (EXCEDRIN MIGRAINE) 250-250-65 MG tablet,  Take by mouth every 6 (six) hours as needed for headache or migraine., Disp: , Rfl:    atorvastatin (LIPITOR) 40 MG tablet, Take 1 tablet (40 mg total) by mouth daily., Disp: 90 tablet, Rfl: 1   buPROPion (WELLBUTRIN SR) 150 MG 12 hr tablet, Take 1 tablet (150 mg total) by mouth 2 (two) times daily., Disp: 180 tablet, Rfl: 1   calcium carbonate (OSCAL) 1500 (600 Ca) MG TABS tablet, Take 600 mg of elemental calcium by mouth 1 day or 1 dose., Disp: , Rfl:    cholecalciferol (VITAMIN D3) 25 MCG (1000 UNIT) tablet, Take 1,000 Units by mouth daily., Disp: , Rfl:    citalopram (CELEXA) 40 MG tablet, TAKE 1 TABLET BY MOUTH EVERY DAY, Disp: 90 tablet, Rfl: 1   clopidogrel (PLAVIX) 75 MG tablet, Take 1 tablet (75 mg total) by mouth daily., Disp: 1 tablet, Rfl: 0   Cyanocobalamin (VITAMIN B-12) 1000 MCG SUBL, Place 1 tablet (1,000 mcg total) under the tongue 2 (two) times a week., Disp: 30 tablet, Rfl: 0   letrozole (FEMARA) 2.5 MG tablet, Take 1 tablet (2.5 mg total) by mouth daily., Disp: 90 tablet, Rfl: 1   levothyroxine (SYNTHROID) 75 MCG tablet, TAKE 1 TABLET(75 MCG) BY MOUTH DAILY BEFORE BREAKFAST, Disp: 90 tablet, Rfl: 0   Multiple Vitamin  (MULTIVITAMIN WITH MINERALS) TABS tablet, Take 1 tablet by mouth daily., Disp: , Rfl:    polyvinyl alcohol (LIQUIFILM TEARS) 1.4 % ophthalmic solution, Place 1 drop into both eyes daily as needed for dry eyes., Disp: , Rfl:    Ubrogepant (UBRELVY) 100 MG TABS, Take 100 mg by mouth daily as needed (migraines)., Disp: , Rfl:   Allergies  Allergen Reactions   Sulfamethoxazole-Trimethoprim Hives   Tnkase [Tenecteplase] Swelling    Mild focal left upper lip swelling    I personally reviewed active problem list, medication list, allergies, family history, social history, health maintenance with the patient/caregiver today.   ROS  Ten systems reviewed and is negative except as mentioned in HPI    Objective  Vitals:   09/12/22 1122  BP: 116/68  Pulse: 81  Resp: 16  SpO2: 98%  Weight: 149 lb (67.6 kg)  Height: 5\' 5"  (1.651 m)    Body mass index is 24.79 kg/m.  Physical Exam  Constitutional: Patient appears well-developed and well-nourished No distress.  HEENT: head atraumatic, normocephalic, pupils equal and reactive to light, neck supple Cardiovascular: Normal rate, regular rhythm and normal heart sounds.  No murmur heard. No BLE edema. Pulmonary/Chest: Effort normal and breath sounds normal. No respiratory distress. Abdominal: Soft.  There is no tenderness. Psychiatric: Patient has a normal mood and affect. behavior is normal. Judgment and thought content normal.   PHQ2/9:    09/12/2022   11:16 AM 04/15/2022    2:13 PM 04/03/2022    2:51 PM 03/14/2022    9:38 AM 12/11/2021   11:27 AM  Depression screen PHQ 2/9  Decreased Interest 0 0 0 0 0  Down, Depressed, Hopeless 0 1 1 1  0  PHQ - 2 Score 0 1 1 1  0  Altered sleeping 0 1 0 0 0  Tired, decreased energy 1 1 1 1  0  Change in appetite 0 1 1 1  0  Feeling bad or failure about yourself  0 0 0 0 0  Trouble concentrating 0 0 0 0 0  Moving slowly or fidgety/restless 0 0 0 0 0  Suicidal thoughts 0 0 0 0 0  PHQ-9 Score 1  4 3 3  0   Difficult doing work/chores  Not difficult at all Somewhat difficult Not difficult at all     phq 9 is negative   Fall Risk:    09/12/2022   11:16 AM 04/15/2022    2:13 PM 04/03/2022    2:47 PM 03/14/2022    9:38 AM 12/11/2021   11:26 AM  Fall Risk   Falls in the past year? 1 0 0 0 0  Number falls in past yr: 0  0  0  Injury with Fall? 0  0  0  Risk for fall due to : No Fall Risks No Fall Risks No Fall Risks No Fall Risks No Fall Risks  Follow up Falls prevention discussed Falls prevention discussed Education provided;Falls prevention discussed Falls prevention discussed Falls prevention discussed      Functional Status Survey: Is the patient deaf or have difficulty hearing?: Yes Does the patient have difficulty seeing, even when wearing glasses/contacts?: No Does the patient have difficulty concentrating, remembering, or making decisions?: Yes Does the patient have difficulty walking or climbing stairs?: No Does the patient have difficulty dressing or bathing?: No Does the patient have difficulty doing errands alone such as visiting a doctor's office or shopping?: Yes    Assessment & Plan  1. History of CVA (cerebrovascular accident)  - atorvastatin (LIPITOR) 40 MG tablet; Take 1 tablet (40 mg total) by mouth daily.  Dispense: 90 tablet; Refill: 2  2. Purpura, nonthrombopenic (HCC)  Reassurance given   3. Major depression, recurrent, chronic (HCC)  - buPROPion (WELLBUTRIN SR) 150 MG 12 hr tablet; Take 1 tablet (150 mg total) by mouth 2 (two) times daily.  Dispense: 180 tablet; Refill: 2 - citalopram (CELEXA) 40 MG tablet; Take 1 tablet (40 mg total) by mouth daily.  Dispense: 90 tablet; Refill: 2  4. B12 deficiency  She is taking otc supplementation   5. Adult hypothyroidism  - levothyroxine (SYNTHROID) 75 MCG tablet; Take 1 tablet (75 mcg total) by mouth daily before breakfast.  Dispense: 90 tablet; Refill: 2  6. Dyslipidemia  - atorvastatin (LIPITOR) 40 MG  tablet; Take 1 tablet (40 mg total) by mouth daily.  Dispense: 90 tablet; Refill: 2  7. Vitamin D deficiency  Taking otc supplementation now   8. History of malignant neoplasm of endometrium  Under the care of oncologist   9. Age-related osteoporosis without current pathological fracture  Managed by Dr. Smith Robert   10. History of left breast cancer

## 2022-09-12 ENCOUNTER — Ambulatory Visit (INDEPENDENT_AMBULATORY_CARE_PROVIDER_SITE_OTHER): Payer: Medicare Other | Admitting: Family Medicine

## 2022-09-12 ENCOUNTER — Encounter: Payer: Self-pay | Admitting: Family Medicine

## 2022-09-12 VITALS — BP 116/68 | HR 81 | Resp 16 | Ht 65.0 in | Wt 149.0 lb

## 2022-09-12 DIAGNOSIS — E785 Hyperlipidemia, unspecified: Secondary | ICD-10-CM | POA: Diagnosis not present

## 2022-09-12 DIAGNOSIS — E538 Deficiency of other specified B group vitamins: Secondary | ICD-10-CM

## 2022-09-12 DIAGNOSIS — D692 Other nonthrombocytopenic purpura: Secondary | ICD-10-CM | POA: Diagnosis not present

## 2022-09-12 DIAGNOSIS — E559 Vitamin D deficiency, unspecified: Secondary | ICD-10-CM

## 2022-09-12 DIAGNOSIS — Z8673 Personal history of transient ischemic attack (TIA), and cerebral infarction without residual deficits: Secondary | ICD-10-CM | POA: Diagnosis not present

## 2022-09-12 DIAGNOSIS — M810 Age-related osteoporosis without current pathological fracture: Secondary | ICD-10-CM

## 2022-09-12 DIAGNOSIS — E039 Hypothyroidism, unspecified: Secondary | ICD-10-CM

## 2022-09-12 DIAGNOSIS — Z853 Personal history of malignant neoplasm of breast: Secondary | ICD-10-CM | POA: Diagnosis not present

## 2022-09-12 DIAGNOSIS — Z8542 Personal history of malignant neoplasm of other parts of uterus: Secondary | ICD-10-CM

## 2022-09-12 DIAGNOSIS — F339 Major depressive disorder, recurrent, unspecified: Secondary | ICD-10-CM | POA: Diagnosis not present

## 2022-09-12 DIAGNOSIS — G459 Transient cerebral ischemic attack, unspecified: Secondary | ICD-10-CM | POA: Insufficient documentation

## 2022-09-12 MED ORDER — LEVOTHYROXINE SODIUM 75 MCG PO TABS: 75.0000 ug | ORAL_TABLET | Freq: Every day | ORAL | 2 refills | Status: DC

## 2022-09-12 MED ORDER — CITALOPRAM HYDROBROMIDE 40 MG PO TABS: 40.0000 mg | ORAL_TABLET | Freq: Every day | ORAL | 2 refills | Status: DC

## 2022-09-12 MED ORDER — BUPROPION HCL ER (SR) 150 MG PO TB12: 150.0000 mg | ORAL_TABLET | Freq: Two times a day (BID) | ORAL | 2 refills | Status: DC

## 2022-09-12 MED ORDER — ATORVASTATIN CALCIUM 40 MG PO TABS: 40.0000 mg | ORAL_TABLET | Freq: Every day | ORAL | 2 refills | Status: DC

## 2022-09-16 ENCOUNTER — Ambulatory Visit: Payer: Medicare Other | Admitting: Physical Therapy

## 2022-09-16 ENCOUNTER — Encounter: Payer: Medicare Other | Admitting: Speech Pathology

## 2022-09-18 ENCOUNTER — Ambulatory Visit: Payer: Medicare Other

## 2022-09-18 ENCOUNTER — Encounter: Payer: Medicare Other | Admitting: Speech Pathology

## 2022-09-23 ENCOUNTER — Encounter: Payer: Medicare Other | Admitting: Speech Pathology

## 2022-09-23 ENCOUNTER — Ambulatory Visit: Payer: Medicare Other | Admitting: Physical Therapy

## 2022-09-25 ENCOUNTER — Other Ambulatory Visit: Payer: Self-pay

## 2022-09-25 ENCOUNTER — Emergency Department
Admission: EM | Admit: 2022-09-25 | Discharge: 2022-09-25 | Disposition: A | Discharge status: Home / Self Care | Payer: Medicare Other | Attending: Emergency Medicine | Admitting: Emergency Medicine

## 2022-09-25 ENCOUNTER — Ambulatory Visit: Payer: Medicare Other

## 2022-09-25 ENCOUNTER — Encounter: Payer: Medicare Other | Admitting: Speech Pathology

## 2022-09-25 ENCOUNTER — Emergency Department: Payer: Medicare Other

## 2022-09-25 ENCOUNTER — Encounter: Payer: Self-pay | Admitting: Intensive Care

## 2022-09-25 DIAGNOSIS — I6782 Cerebral ischemia: Secondary | ICD-10-CM | POA: Diagnosis not present

## 2022-09-25 DIAGNOSIS — R42 Dizziness and giddiness: Secondary | ICD-10-CM | POA: Insufficient documentation

## 2022-09-25 DIAGNOSIS — S0990XD Unspecified injury of head, subsequent encounter: Secondary | ICD-10-CM | POA: Diagnosis not present

## 2022-09-25 LAB — BASIC METABOLIC PANEL
Anion gap: 10 (ref 5–15)
BUN: 30 mg/dL — ABNORMAL HIGH (ref 8–23)
CO2: 25 mmol/L (ref 22–32)
Calcium: 8.9 mg/dL (ref 8.9–10.3)
Chloride: 103 mmol/L (ref 98–111)
Creatinine, Ser: 0.81 mg/dL (ref 0.44–1.00)
GFR, Estimated: >60 mL/min (ref >60)
Glucose, Bld: 84 mg/dL (ref 70–99)
Potassium: 3.9 mmol/L (ref 3.5–5.1)
Sodium: 138 mmol/L (ref 135–145)

## 2022-09-25 LAB — CBC
HCT: 38.6 % (ref 36.0–46.0)
Hemoglobin: 12.7 g/dL (ref 12.0–15.0)
MCH: 32.1 pg (ref 26.0–34.0)
MCHC: 32.9 g/dL (ref 30.0–36.0)
MCV: 97.5 fL (ref 80.0–100.0)
Platelets: 188 10*3/uL (ref 150–400)
RBC: 3.96 MIL/uL (ref 3.87–5.11)
RDW: 11.9 % (ref 11.5–15.5)
WBC: 5 10*3/uL (ref 4.0–10.5)
nRBC: 0 % (ref 0.0–0.2)

## 2022-09-25 LAB — CBG MONITORING, ED: Glucose-Capillary: 76 mg/dL (ref 70–99)

## 2022-09-25 NOTE — ED Triage Notes (Addendum)
Patient reports having "dizzy spell" while driving and pulled over. Reports episode lasted 5 minutes. Now reports feeling exhausted.   Husband reports patient is at her baseline in triage.   Patient reports falling X2 weeks ago and hitting the back of her head on driveway

## 2022-09-25 NOTE — ED Provider Notes (Signed)
[facility] Provider Note    Event Date/Time   First MD Initiated Contact with Patient 09/25/22 1941     (approximate)   History   Dizziness   HPI  Natalie [name] is a 72 y.o. female who presents to the emergency department today because of concerns for an episode of dizziness.  Patient states she was driving a car when all of a sudden she felt dizzy.  She did pull off to the side of the road.  Episode lasted maybe 5 to 10 minutes.  She then was able to drive home.  Patient denies any chest pain or palpitations during this episode.  She has had episodes of dizziness before although they have only happen when she has been standing up.  She does have a history of CVAs and states that as a part of her workup she has worn monitors which have not shown any concerning arrhythmias.     Physical Exam   Triage Vital Signs: ED Triage Vitals  Encounter Vitals Group     BP 09/25/22 1754 126/83     Systolic BP Percentile --      Diastolic BP Percentile --      Pulse Rate 09/25/22 1754 65     Resp 09/25/22 1754 16     Temp 09/25/22 1754 98.4 F (36.9 C)     Temp Source 09/25/22 1754 Oral     SpO2 09/25/22 1754 96 %     Weight 09/25/22 1757 150 lb (68 kg)     Height 09/25/22 1757 5\' 5"  (1.651 m)     Head Circumference --      Peak Flow --      Pain Score 09/25/22 1757 0     Pain Loc --      Pain Education --      Exclude from Growth Chart --     Most recent vital signs: Vitals:   09/25/22 1754  BP: 126/83  Pulse: 65  Resp: 16  Temp: 98.4 F (36.9 C)  SpO2: 96%   General: Awake, alert, oriented. CV:  Good peripheral perfusion. Regular rate and rhythm. Resp:  Normal effort. Lungs clear. Abd:  No distention.  Neuro:  PERRL. EOMI. Face symmetric. Strength 5/5 in all extremities. Sensation grossly intact.    ED Results / Procedures / Treatments   Labs (all labs ordered are listed, but only abnormal results are displayed) Labs Reviewed  BASIC  METABOLIC PANEL - Abnormal; Notable for the following components:      Result Value   BUN 30 (*)    All other components within normal limits  CBC  CBG MONITORING, ED     EKG  I, Phineas Semen, attending physician, personally viewed and interpreted this EKG  EKG Time: 1801 Rate: 67 Rhythm: normal sinus rhythm Axis: left axis deviation Intervals: qtc 426 QRS: LAFB ST changes: no st elevation Impression: abnormal ekg   RADIOLOGY I independently interpreted and visualized the CT head. My interpretation: No bleed Radiology interpretation:  IMPRESSION:  1. Generalized cerebral atrophy with mild, chronic white matter  small vessel ischemic changes.  2. No acute intracranial abnormality.      PROCEDURES:  Critical Care performed: No  MEDICATIONS ORDERED IN ED: Medications - No data to display   IMPRESSION / MDM / ASSESSMENT AND PLAN / ED COURSE  I reviewed the triage vital signs and the nursing notes.  Differential diagnosis includes, but is not limited to, anemia, electrolyte abnormality, arrhythmia  Patient's presentation is most consistent with acute presentation with potential threat to life or bodily function.   The patient is on the cardiac monitor to evaluate for evidence of arrhythmia and/or significant heart rate changes.  Presented to the emergency department today after an episode of dizziness that occurred while she was driving.  The time my exam patient is back to her baseline.  No concerning neurofindings on exam.  Blood work without concerning anemia or electrolyte abnormality.  CT scan without concerning abnormality.  This time somewhat unclear etiology however given reassuring workup I do think is reasonable for patient be discharged. Encouraged patient to follow up with primary care.      FINAL CLINICAL IMPRESSION(S) / ED DIAGNOSES   Final diagnoses:  Dizziness     Note:  This document was prepared using Dragon  voice recognition software and may include unintentional dictation errors.    Phineas Semen, MD 09/25/22 2100

## 2022-09-25 NOTE — Discharge Instructions (Signed)
Please seek medical attention for any high fevers, chest pain, shortness of breath, change in behavior, persistent vomiting, bloody stool or any other new or concerning symptoms.  

## 2022-09-26 ENCOUNTER — Encounter: Payer: Self-pay | Admitting: Family Medicine

## 2022-09-30 ENCOUNTER — Ambulatory Visit: Payer: Medicare Other | Admitting: Physical Therapy

## 2022-10-02 ENCOUNTER — Ambulatory Visit: Payer: Medicare Other

## 2022-10-03 ENCOUNTER — Telehealth: Payer: Self-pay

## 2022-10-03 NOTE — Telephone Encounter (Signed)
Transition Care Management Unsuccessful Follow-up Telephone Call  Date of discharge and from where:  09/25/2022 [facility]  Attempts:  1st Attempt  Reason for unsuccessful TCM follow-up call:  Left voice message  Kaylaann Mountz [facility]  [facility] Population Health Community Resource Care Guide   [email or web address]  ?? [phone]   Website: [email or web address]  [email or web address]

## 2022-10-07 ENCOUNTER — Ambulatory Visit: Payer: Medicare Other | Admitting: Physical Therapy

## 2022-10-07 ENCOUNTER — Other Ambulatory Visit: Payer: Self-pay | Admitting: Nurse Practitioner

## 2022-10-07 ENCOUNTER — Telehealth: Payer: Self-pay

## 2022-10-07 NOTE — Telephone Encounter (Signed)
Transition Care Management Unsuccessful Follow-up Telephone Call  Date of discharge and from where:  09/25/2022 [facility]  Attempts:  2nd Attempt  Reason for unsuccessful TCM follow-up call:  Left voice message  Digna Countess [facility]  [facility] Population Health Community Resource Care Guide   [email or web address]  ?? [phone]   Website: [email or web address]  [email or web address]

## 2022-10-09 ENCOUNTER — Ambulatory Visit: Payer: Medicare Other

## 2022-10-10 ENCOUNTER — Other Ambulatory Visit: Payer: Self-pay | Admitting: Family Medicine

## 2022-10-10 ENCOUNTER — Telehealth: Payer: Self-pay | Admitting: Family Medicine

## 2022-10-10 DIAGNOSIS — F339 Major depressive disorder, recurrent, unspecified: Secondary | ICD-10-CM

## 2022-10-10 NOTE — Telephone Encounter (Signed)
Medication Refill - Medication:  citalopram (CELEXA) 40 MG tablet  1 tablet left for Monday, patient stated she can not go without this medication    Has the patient contacted their pharmacy? Yes.  Advised to contact PCP, received an email the medication refill was denied  Preferred Pharmacy (with phone number or street name):  [id number] [place], [place] - [address] AT [facility] [address]  Phone: [phone] Fax: [phone]   Has the patient been seen for an appointment in the last year OR does the patient have an upcoming appointment? Yes.

## 2022-10-10 NOTE — Telephone Encounter (Signed)
[facility] called and spoke to [employer] consultant about the refill(s) citalopram requested. Advised it was sent on 09/12/22 #90/2 refill(s. She says the medication is ready for pickup. Patient called, left detailed VM per DPR of the above, call office back if any questions.

## 2022-10-13 DIAGNOSIS — R42 Dizziness and giddiness: Secondary | ICD-10-CM | POA: Diagnosis not present

## 2022-10-13 DIAGNOSIS — G4733 Obstructive sleep apnea (adult) (pediatric): Secondary | ICD-10-CM | POA: Diagnosis not present

## 2022-10-13 DIAGNOSIS — E785 Hyperlipidemia, unspecified: Secondary | ICD-10-CM | POA: Diagnosis not present

## 2022-10-13 DIAGNOSIS — R002 Palpitations: Secondary | ICD-10-CM | POA: Diagnosis not present

## 2022-10-13 DIAGNOSIS — R270 Ataxia, unspecified: Secondary | ICD-10-CM | POA: Diagnosis not present

## 2022-10-13 DIAGNOSIS — G3184 Mild cognitive impairment, so stated: Secondary | ICD-10-CM | POA: Diagnosis not present

## 2022-10-13 DIAGNOSIS — R5382 Chronic fatigue, unspecified: Secondary | ICD-10-CM | POA: Diagnosis not present

## 2022-10-13 DIAGNOSIS — R55 Syncope and collapse: Secondary | ICD-10-CM | POA: Diagnosis not present

## 2022-10-13 DIAGNOSIS — I639 Cerebral infarction, unspecified: Secondary | ICD-10-CM | POA: Diagnosis not present

## 2022-10-13 DIAGNOSIS — I10 Essential (primary) hypertension: Secondary | ICD-10-CM | POA: Diagnosis not present

## 2022-10-13 DIAGNOSIS — E782 Mixed hyperlipidemia: Secondary | ICD-10-CM | POA: Diagnosis not present

## 2022-10-13 DIAGNOSIS — Z8673 Personal history of transient ischemic attack (TIA), and cerebral infarction without residual deficits: Secondary | ICD-10-CM | POA: Diagnosis not present

## 2022-10-14 ENCOUNTER — Ambulatory Visit: Payer: Medicare Other | Admitting: Physical Therapy

## 2022-10-16 ENCOUNTER — Ambulatory Visit: Payer: Medicare Other | Admitting: Physical Therapy

## 2022-10-21 ENCOUNTER — Ambulatory Visit: Payer: Medicare Other | Admitting: Physical Therapy

## 2022-10-23 ENCOUNTER — Ambulatory Visit: Payer: Medicare Other

## 2022-10-24 ENCOUNTER — Other Ambulatory Visit: Payer: Self-pay | Admitting: Family Medicine

## 2022-10-24 DIAGNOSIS — E785 Hyperlipidemia, unspecified: Secondary | ICD-10-CM

## 2022-10-24 DIAGNOSIS — Z8673 Personal history of transient ischemic attack (TIA), and cerebral infarction without residual deficits: Secondary | ICD-10-CM

## 2022-10-28 ENCOUNTER — Ambulatory Visit: Payer: Medicare Other | Admitting: Physical Therapy

## 2022-10-28 DIAGNOSIS — Z23 Encounter for immunization: Secondary | ICD-10-CM | POA: Diagnosis not present

## 2022-10-30 ENCOUNTER — Ambulatory Visit: Payer: Medicare Other | Admitting: Physical Therapy

## 2022-11-04 ENCOUNTER — Ambulatory Visit: Payer: Medicare Other

## 2022-11-06 ENCOUNTER — Ambulatory Visit: Payer: Medicare Other

## 2022-11-11 ENCOUNTER — Ambulatory Visit: Payer: Medicare Other

## 2022-11-13 ENCOUNTER — Ambulatory Visit: Payer: Medicare Other

## 2022-11-18 ENCOUNTER — Ambulatory Visit: Payer: Medicare Other

## 2022-11-20 ENCOUNTER — Ambulatory Visit: Payer: Medicare Other

## 2022-11-25 ENCOUNTER — Ambulatory Visit: Payer: Medicare Other

## 2022-11-27 ENCOUNTER — Ambulatory Visit: Payer: Medicare Other

## 2022-12-02 ENCOUNTER — Ambulatory Visit: Payer: Medicare Other

## 2022-12-04 ENCOUNTER — Ambulatory Visit: Payer: Medicare Other

## 2022-12-09 ENCOUNTER — Ambulatory Visit: Payer: Medicare Other

## 2022-12-11 ENCOUNTER — Ambulatory Visit: Payer: Medicare Other

## 2022-12-16 ENCOUNTER — Ambulatory Visit: Payer: Medicare Other

## 2022-12-18 ENCOUNTER — Ambulatory Visit: Payer: Medicare Other

## 2022-12-23 ENCOUNTER — Ambulatory Visit: Payer: Medicare Other

## 2022-12-25 ENCOUNTER — Ambulatory Visit: Payer: Medicare Other

## 2022-12-27 NOTE — Progress Notes (Signed)
Radiation Oncology         [phone] ________________________________  Name: Natalie [name] MRN: [id number]  Date: 12/29/2022  DOB: [birth year]  Follow-Up Visit Note  CC: Alba Cory, MD  Carver Fila, MD  No diagnosis found.  Diagnosis: The encounter diagnosis was Endometrial cancer (HCC).   Stage 1A grade 1 endometrioid endometrial adenocarcinoma   Stage Ia (T1 aN0 M0) Left Breast invasive mammary carcinoma tubular type with low grade DCIS, ER/PR positive, Her2 negative, Grade 1, s/p lumpectomy and radiation therapy with Dr. Rushie Chestnut   Interval Since Last Radiation: 1 year, 1 month, and 15 days   Intent: Curative  2) Radiation treatment dates: 10/22/21 through 11/12/21 Site: Left breast (Dr. Rushie Chestnut) Dose: 2.66 Gy/Fx delivered in 16 fractions for a total of 42.56 GY Energy: 10X-FFF/6X-FFF   Treatment Dates: 11/13/21  Site: Left Breast Boost to incision site  Energy: 9E Dose: 2 Gy/Fx delivered in 5 fractions for a total of 10 Gy   1) Radiation Treatment Dates: 02/11/2021 through 03/14/2021 Site Technique Total Dose (Gy) Dose per Fx (Gy) Completed Fx Beam Energies  Vagina: Pelvis HDR-brachy 30/30 6 5/5 Ir-192   Narrative:  The patient returns today for routine 6 month follow-up. She was last seen here for follow-up on 06/26/22.  Since her last visit, she met with Dr. Rushie Chestnut at the [facility] for follow-up of her left breast cancer. She was noted to be doing well at that time and NED with regards to her breast cancer.    She was also noted to be doing well and NED on examination during her most recent visit with Dr. Pricilla Holm on 09/05/22.    Pertinent imaging performed in the interval includes a follow-up bilateral diagnostic mammogram on 07/14/22 which showed no evidence of malignancy in either breast.   Of note: She did present to the ED on 09/25/22 following an episode of dizziness while driving. ED work-up including a CT of the head was  unremarkable overall and she was discharged in stable condition. Etiology for this episode remains unclear.                 Allergies:  is allergic to sulfamethoxazole-trimethoprim and tnkase [tenecteplase].  Meds: Current Outpatient Medications  Medication Sig Dispense Refill   alendronate (FOSAMAX) 70 MG tablet TAKE 1 TABLET(70 MG) BY MOUTH 1 TIME A WEEK WITH A FULL GLASS OF WATER AND ON AN EMPTY STOMACH 4 tablet 5   aspirin 81 MG EC tablet Take by mouth.     aspirin-acetaminophen-caffeine (EXCEDRIN MIGRAINE) 250-250-65 MG tablet Take by mouth every 6 (six) hours as needed for headache or migraine.     atorvastatin (LIPITOR) 40 MG tablet Take 1 tablet (40 mg total) by mouth daily. 90 tablet 2   buPROPion (WELLBUTRIN SR) 150 MG 12 hr tablet Take 1 tablet (150 mg total) by mouth 2 (two) times daily. 180 tablet 2   calcium carbonate (OSCAL) 1500 (600 Ca) MG TABS tablet Take 600 mg of elemental calcium by mouth 1 day or 1 dose.     cholecalciferol (VITAMIN D3) 25 MCG (1000 UNIT) tablet Take 1,000 Units by mouth daily.     citalopram (CELEXA) 40 MG tablet Take 1 tablet (40 mg total) by mouth daily. 90 tablet 2   clopidogrel (PLAVIX) 75 MG tablet Take 1 tablet (75 mg total) by mouth daily. 1 tablet 0   Cyanocobalamin (VITAMIN B-12) 1000 MCG SUBL Place 1 tablet (1,000 mcg total) under the tongue 2 (  two) times a week. 30 tablet 0   letrozole (FEMARA) 2.5 MG tablet TAKE 1 TABLET(2.5 MG) BY MOUTH DAILY 90 tablet 1   levothyroxine (SYNTHROID) 75 MCG tablet Take 1 tablet (75 mcg total) by mouth daily before breakfast. 90 tablet 2   Multiple Vitamin (MULTIVITAMIN WITH MINERALS) TABS tablet Take 1 tablet by mouth daily.     polyvinyl alcohol (LIQUIFILM TEARS) 1.4 % ophthalmic solution Place 1 drop into both eyes daily as needed for dry eyes.     Ubrogepant (UBRELVY) 100 MG TABS Take 100 mg by mouth daily as needed (migraines).     No current facility-administered medications for this encounter.     Physical Findings: The patient is in no acute distress. Patient is alert and oriented.  vitals were not taken for this visit. .  No significant changes. Lungs are clear to auscultation bilaterally. Heart has regular rate and rhythm. No palpable cervical, supraclavicular, or axillary adenopathy. Abdomen soft, non-tender, normal bowel sounds.  On pelvic examination the external genitalia were unremarkable. A speculum exam was performed. There are no mucosal lesions noted in the vaginal vault. A Pap smear was obtained of the proximal vagina. On bimanual and rectovaginal examination there were no pelvic masses appreciated. ***  Right Breast: no palpable mass, nipple discharge or bleeding. Left Breast: ***   Lab Findings: Lab Results  Component Value Date   WBC 5.0 09/25/2022   HGB 12.7 09/25/2022   HCT 38.6 09/25/2022   MCV 97.5 09/25/2022   PLT 188 09/25/2022    Radiographic Findings: No results found.  Impression:  Stage 1A grade 1 endometrioid endometrial adenocarcinoma   Stage Ia (T1 aN0 M0) Left Breast invasive mammary carcinoma tubular type with low grade DCIS, ER/PR positive, Her2 negative, Grade 1, s/p lumpectomy and radiation therapy with Dr. Rushie Chestnut   The patient is recovering from the effects of radiation.  ***  Plan:  ***   *** minutes of total time was spent for this patient encounter, including preparation, face-to-face counseling with the patient and coordination of care, physical exam, and documentation of the encounter. ____________________________________  Billie Lade, PhD, MD  This document serves as a record of services personally performed by Antony Blackbird, MD. It was created on his behalf by Neena Rhymes, a trained medical scribe. The creation of this record is based on the scribe's personal observations and the provider's statements to them. This document has been checked and approved by the attending provider.

## 2022-12-29 ENCOUNTER — Encounter: Payer: Self-pay | Admitting: Radiation Oncology

## 2022-12-29 ENCOUNTER — Ambulatory Visit
Admission: RE | Admit: 2022-12-29 | Discharge: 2022-12-29 | Disposition: A | Discharge status: Home / Self Care | Payer: Medicare Other | Source: Ambulatory Visit | Attending: Radiation Oncology | Admitting: Radiation Oncology

## 2022-12-29 VITALS — BP 119/83 | HR 61 | Temp 97.7°F | Resp 18 | Wt 157.2 lb

## 2022-12-29 DIAGNOSIS — Z923 Personal history of irradiation: Secondary | ICD-10-CM | POA: Insufficient documentation

## 2022-12-29 DIAGNOSIS — Z8542 Personal history of malignant neoplasm of other parts of uterus: Secondary | ICD-10-CM | POA: Diagnosis not present

## 2022-12-29 DIAGNOSIS — Z79899 Other long term (current) drug therapy: Secondary | ICD-10-CM | POA: Diagnosis not present

## 2022-12-29 DIAGNOSIS — Z853 Personal history of malignant neoplasm of breast: Secondary | ICD-10-CM | POA: Insufficient documentation

## 2022-12-29 DIAGNOSIS — C541 Malignant neoplasm of endometrium: Secondary | ICD-10-CM | POA: Diagnosis not present

## 2022-12-29 DIAGNOSIS — Z7902 Long term (current) use of antithrombotics/antiplatelets: Secondary | ICD-10-CM | POA: Insufficient documentation

## 2022-12-29 DIAGNOSIS — Z79890 Hormone replacement therapy (postmenopausal): Secondary | ICD-10-CM | POA: Diagnosis not present

## 2022-12-29 NOTE — Progress Notes (Addendum)
Natalie [name] is here today for follow up post radiation to the pelvic.  They completed their radiation on: 03/14/2021  Does the patient complain of any of the following:  Pain:No Abdominal bloating: No Diarrhea/Constipation: No Nausea/Vomiting: No Vaginal Discharge: No Blood in Urine or Stool: No Urinary Issues (dysuria/incomplete emptying/ incontinence/ increased frequency/urgency): No Does patient report using vaginal dilator 2-3 times a week and/or sexually active 2-3 weeks: She reports using dilators. Post radiation skin changes: No    BP 119/83   Pulse 61   Temp 97.7 F (36.5 C)   Resp 18   Wt 157 lb 3.2 oz (71.3 kg)   SpO2 100%   BMI 26.16 kg/m

## 2023-01-14 ENCOUNTER — Telehealth: Payer: Self-pay | Admitting: *Deleted

## 2023-01-14 NOTE — Telephone Encounter (Signed)
 Per provider moved appt from 2/20 to 2/14, patient aware

## 2023-01-20 ENCOUNTER — Encounter: Payer: Self-pay | Admitting: Oncology

## 2023-01-20 ENCOUNTER — Inpatient Hospital Stay: Payer: Medicare Other | Attending: Oncology | Admitting: Oncology

## 2023-01-20 VITALS — BP 119/77 | HR 67 | Temp 98.9°F | Resp 18 | Wt 155.6 lb

## 2023-01-20 DIAGNOSIS — C50912 Malignant neoplasm of unspecified site of left female breast: Secondary | ICD-10-CM | POA: Insufficient documentation

## 2023-01-20 DIAGNOSIS — Z170 Estrogen receptor positive status [ER+]: Secondary | ICD-10-CM | POA: Diagnosis not present

## 2023-01-20 DIAGNOSIS — Z923 Personal history of irradiation: Secondary | ICD-10-CM | POA: Insufficient documentation

## 2023-01-20 DIAGNOSIS — M810 Age-related osteoporosis without current pathological fracture: Secondary | ICD-10-CM

## 2023-01-20 DIAGNOSIS — Z08 Encounter for follow-up examination after completed treatment for malignant neoplasm: Secondary | ICD-10-CM | POA: Diagnosis not present

## 2023-01-20 DIAGNOSIS — Z87891 Personal history of nicotine dependence: Secondary | ICD-10-CM | POA: Insufficient documentation

## 2023-01-20 DIAGNOSIS — Z79811 Long term (current) use of aromatase inhibitors: Secondary | ICD-10-CM | POA: Diagnosis not present

## 2023-01-20 DIAGNOSIS — Z79899 Other long term (current) drug therapy: Secondary | ICD-10-CM | POA: Diagnosis not present

## 2023-01-20 DIAGNOSIS — Z853 Personal history of malignant neoplasm of breast: Secondary | ICD-10-CM | POA: Diagnosis not present

## 2023-01-20 NOTE — Progress Notes (Signed)
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  Diagnostic mammogram and ultrasound showed indeterminate left breast asymmetry as well as a probable benign right-sided breast mass.  Biopsy of both the masses was recommended.  However the second mass resolved at the time of biopsy likely consistent with fat necrosis from anticoagulate use.  Left breast mass biopsy showed invasive mammary carcinoma grade 1 ER greater than 90% positive, PR negative and HER2 negative.   Personal history of endometrial cancer in November 2022 s/p surgery and adjuvant radiation therapy.  Also had a posterior circulation stroke in January 2022 for which she is on dual antiplatelet therapy.   Final pathology showed a 5 mm tubular carcinoma grade 1 ER greater than 90% positive PR 10% positive and HER2 negative.  Negative margins.  3 sentinel lymph nodes negative for malignancy.  PT1AN0.  Patient has baseline osteoporosis.  Tamoxifen was not started given her history of TIAs and patient started taking letrozole in March  2024.      Interval history-patient was previously on Plavix which was stopped and she has not had any further dizzy spells which she experienced while on Plavix.  No strokelike events.  Tolerating letrozole along with weekly Fosamax well without any significant side effects.  Denies any breast concerns today.  ECOG PS- 1 Pain scale- 0   Review of systems- Review of Systems  Constitutional:  Negative for chills, fever, malaise/fatigue and weight loss.  HENT:  Negative for congestion, ear discharge and nosebleeds.   Eyes:  Negative for blurred vision.  Respiratory:  Negative for cough, hemoptysis, sputum production, shortness of breath and wheezing.   Cardiovascular:  Negative for chest pain, palpitations, orthopnea and claudication.  Gastrointestinal:  Negative for abdominal pain, blood in stool, constipation, diarrhea, heartburn, melena, nausea and vomiting.  Genitourinary:  Negative for dysuria, flank pain, frequency, hematuria and urgency.  Musculoskeletal:  Negative for back pain, joint pain and myalgias.  Skin:  Negative for rash.  Neurological:  Negative for dizziness, tingling, focal weakness, seizures, weakness and headaches.  Endo/Heme/Allergies:  Does not bruise/bleed easily.  Psychiatric/Behavioral:  Negative for depression and suicidal ideas. The patient does not have insomnia.       Allergies  Allergen Reactions   Sulfamethoxazole-Trimethoprim Hives   Tnkase [Tenecteplase] Swelling    Mild focal left upper lip swelling     Past Medical History:  Diagnosis Date   Acute bronchospasm due to viral infection 2020   Due to pollen   Acute ischemic left MCA stroke (HCC) 04/19/2020   a.) CTA head/neck 04/19/2020 --> nearly occlusive thrombus at bifurcation of a proximal M2 MCA branch approximately 6 mm from the MCA bifurcation. Subsequent occlusion of a proximal left  M3 MCA branch   Allergy    Anemia    Angina at rest [facility])    Breast cancer, left (HCC) 08/21/2021   a.)  stereotactic Bx 08/21/2021 --> IMC (G1, ER/PR +, Her2/neu -)   Cataract    a.) s/p extraction with IOL placement   Depressive disorder    Diverticulosis    Dyspnea    Epistaxis    Fatigue    Hearing loss    History of 2019 novel coronavirus disease (COVID-19) 12/18/2020   History of ischemic multifocal posterior circulation stroke 02/28/2020   a.) presented to ED with increasing episodes of  transient vertical binocular diplopia; MRI 02/27/2021 --> multiple punctate acute to subacute ischemic infarcts involving the cortical/subcortical aspects of both parieto-occipital regions   History of loop recorder    History of radiation therapy    Vagina-02/11/21-03/14/21- Dr. 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/18/2020   a.) stage 1A; grade 1 (pT1a, pN0); b.) s/p TLH/BSO 12/18/2020 + adjuvant vaginal brachytherapy (30 Gy over 5 fractions between 02/11/2021 - 03/14/2021)   Vitamin D deficiency      Past Surgical History:  Procedure Laterality Date   ABDOMINAL HYSTERECTOMY  12/18/2020   ABLATION  2006   AXILLARY SENTINEL NODE BIOPSY Left 09/25/2021   Procedure: AXILLARY SENTINEL NODE BIOPSY;  Surgeon: Earline Mayotte, MD;  Location: [facility] ORS;  Service: General;  Laterality: Left;   BREAST BIOPSY Left 08/21/2021   Stereo Bx, X-clip; path --> IMC (G1, ER/PR +, Her2/neu -)   BREAST LUMPECTOMY Left 09/25/2021   BREAST LUMPECTOMY WITH NEEDLE LOCALIZATION Left 09/25/2021   Procedure: BREAST LUMPECTOMY WITH NEEDLE LOCALIZATION;  Surgeon: Earline Mayotte, MD;  Location: [facility] ORS;  Service: General;  Laterality: Left;   BUBBLE STUDY  04/23/2020   Procedure: BUBBLE STUDY;  Surgeon: Laurey Morale, MD;  Location: [facility] ENDOSCOPY;  Service: Cardiovascular;;   CATARACT EXTRACTION W/ INTRAOCULAR LENS IMPLANT Bilateral    COLONOSCOPY     COLONOSCOPY WITH PROPOFOL N/A 12/22/2018   Procedure: COLONOSCOPY WITH PROPOFOL;  Surgeon: Pasty Spillers, MD;  Location: [facility] ENDOSCOPY;  Service: Endoscopy;  Laterality: N/A;   EYE SURGERY  Spring 2018   Cataracts both eyes   HYSTEROSCOPY WITH D & C N/A 11/01/2020   Procedure: DILATATION AND CURETTAGE /HYSTEROSCOPY WITH MYOSURE;  Surgeon: Carver Fila, MD;  Location: [facility] ORS;  Service: Gynecology;  Laterality: N/A;   LAPAROTOMY  12/18/2020   Procedure: LAPAROTOMY;  Surgeon: Carver Fila, MD;  Location: [facility] ORS;  Service: Gynecology;;   LOOP RECORDER INSERTION N/A 04/23/2020   Procedure: LOOP RECORDER INSERTION;  Surgeon: Marinus Maw, MD;  Location: [facility] INVASIVE CV LAB;  Service: Cardiovascular;  Laterality: N/A;   ROBOTIC ASSISTED TOTAL HYSTERECTOMY WITH BILATERAL SALPINGO OOPHERECTOMY Bilateral 12/18/2020   Procedure: XI ROBOTIC ASSISTED TOTAL HYSTERECTOMY WITH BILATERAL SALPINGO OOPHORECTOMY;  Surgeon: Carver Fila, MD;  Location: [facility] ORS;  Service: Gynecology;  Laterality: Bilateral;  SENTINEL NODE BIOPSY N/A 12/18/2020   Procedure: SENTINEL NODE BIOPSY;  Surgeon: Carver Fila, MD;  Location: [facility] ORS;  Service: Gynecology;  Laterality: N/A;   TEE WITHOUT CARDIOVERSION N/A 04/23/2020   Procedure: TRANSESOPHAGEAL ECHOCARDIOGRAM (TEE);  Surgeon: Laurey Morale, MD;  Location: [facility] ENDOSCOPY;  Service: Cardiovascular;  Laterality: N/A;   TONSILLECTOMY      Social History   Socioeconomic History   Marital status: Single    Spouse name: Not on file   Number of children: 1   Years of education: Not on file   Highest education level: Bachelor's degree (e.g., BA, AB, BS)   Occupational History   Occupation: retired   Tobacco Use   Smoking status: Former    Current packs/day: 0.00    Average packs/day: 0.5 packs/day for 6.0 years (3.0 ttl pk-yrs)    Types: Cigarettes    Start date: 02/12/1968    Quit date: 02/11/1974    Years since quitting: 48.9   Smokeless tobacco: Never  Vaping Use   Vaping status: Never Used  Substance and Sexual Activity   Alcohol use: Yes    Comment: rare   Drug use: Never   Sexual activity: Not Currently    Comment: Don't use not sexually active  Other Topics Concern   Not on file  Social History Narrative   Raised an adopted child on her own   Working part time reviewed documents   Social Drivers of Corporate investment banker Strain: Low Risk  (04/03/2022)   Overall Financial Resource Strain (CARDIA)    Difficulty of Paying Living Expenses: Not very hard  Food Insecurity: No Food Insecurity (04/03/2022)   Hunger Vital Sign    Worried About Running Out of Food in the Last Year: Never true    Ran Out of Food in the Last Year: Never true  Transportation Needs: No Transportation Needs (04/03/2022)   PRAPARE - Administrator, Civil Service (Medical): No    Lack of Transportation (Non-Medical): No  Physical Activity: Insufficiently Active (04/03/2022)   Exercise Vital Sign    Days of Exercise per Week: 2 days    Minutes of Exercise per Session: 30 min  Stress: No Stress Concern Present (04/03/2022)   [employer] of Occupational Health - Occupational Stress Questionnaire    Feeling of Stress : Not at all  Social Connections: Moderately Integrated (04/03/2022)   Social Connection and Isolation Panel [NHANES]    Frequency of Communication with Friends and Family: Three times a week    Frequency of Social Gatherings with Friends and Family: More than three times a week    Attends Religious Services: More than 4 times per year    Active Member of [employer] or Organizations: Yes    Attends Banker  Meetings: More than 4 times per year    Marital Status: Never married  Intimate Partner Violence: Not At Risk (04/03/2022)   Humiliation, Afraid, Rape, and Kick questionnaire    Fear of Current or Ex-Partner: No    Emotionally Abused: No    Physically Abused: No    Sexually Abused: No    Family History  Problem Relation Age of Onset   Early death Father        suicide   Depression Father    Diabetes Father    Cancer Father        prostate   Hearing loss Father        due to war  Alzheimer's disease Mother    Heart disease Brother    Atrial fibrillation Brother    Heart disease Maternal Aunt    Dementia Maternal Aunt    Heart disease Maternal Uncle    Dementia Maternal Grandmother    Heart disease Brother    Atrial fibrillation Brother    Colon cancer Neg Hx    Breast cancer Neg Hx    Ovarian cancer Neg Hx    Pancreatic cancer Neg Hx    Endometrial cancer Neg Hx      Current Outpatient Medications:    alendronate (FOSAMAX) 70 MG tablet, TAKE 1 TABLET(70 MG) BY MOUTH 1 TIME A WEEK WITH A FULL GLASS OF WATER AND ON AN EMPTY STOMACH, Disp: 4 tablet, Rfl: 5   aspirin 81 MG EC tablet, Take by mouth., Disp: , Rfl:    aspirin-acetaminophen-caffeine (EXCEDRIN MIGRAINE) 250-250-65 MG tablet, Take by mouth every 6 (six) hours as needed for headache or migraine., Disp: , Rfl:    atorvastatin (LIPITOR) 40 MG tablet, Take 1 tablet (40 mg total) by mouth daily., Disp: 90 tablet, Rfl: 2   buPROPion (WELLBUTRIN SR) 150 MG 12 hr tablet, Take 1 tablet (150 mg total) by mouth 2 (two) times daily., Disp: 180 tablet, Rfl: 2   calcium carbonate (OSCAL) 1500 (600 Ca) MG TABS tablet, Take 600 mg of elemental calcium by mouth 1 day or 1 dose., Disp: , Rfl:    cholecalciferol (VITAMIN D3) 25 MCG (1000 UNIT) tablet, Take 1,000 Units by mouth daily., Disp: , Rfl:    citalopram (CELEXA) 40 MG tablet, Take 1 tablet (40 mg total) by mouth daily., Disp: 90 tablet, Rfl: 2   clopidogrel (PLAVIX) 75 MG  tablet, Take 1 tablet (75 mg total) by mouth daily., Disp: 1 tablet, Rfl: 0   Cyanocobalamin (VITAMIN B-12) 1000 MCG SUBL, Place 1 tablet (1,000 mcg total) under the tongue 2 (two) times a week., Disp: 30 tablet, Rfl: 0   letrozole (FEMARA) 2.5 MG tablet, TAKE 1 TABLET(2.5 MG) BY MOUTH DAILY, Disp: 90 tablet, Rfl: 1   levothyroxine (SYNTHROID) 75 MCG tablet, Take 1 tablet (75 mcg total) by mouth daily before breakfast., Disp: 90 tablet, Rfl: 2   Multiple Vitamin (MULTIVITAMIN WITH MINERALS) TABS tablet, Take 1 tablet by mouth daily., Disp: , Rfl:    polyvinyl alcohol (LIQUIFILM TEARS) 1.4 % ophthalmic solution, Place 1 drop into both eyes daily as needed for dry eyes. (Patient not taking: Reported on 12/29/2022), Disp: , Rfl:    Ubrogepant (UBRELVY) 100 MG TABS, Take 100 mg by mouth daily as needed (migraines)., Disp: , Rfl:   Physical exam:  Vitals:   01/20/23 1306  BP: 119/77  Pulse: 67  Resp: 18  Temp: 98.9 F (37.2 C)  TempSrc: Tympanic  SpO2: 98%  Weight: 155 lb 9.6 oz (70.6 kg)   Physical Exam Cardiovascular:     Rate and Rhythm: Normal rate and regular rhythm.     Heart sounds: Normal heart sounds.  Pulmonary:     Effort: Pulmonary effort is normal.     Breath sounds: Normal breath sounds.  Skin:    General: Skin is warm and dry.  Neurological:     Mental Status: She is alert and oriented to person, place, and time.    Breast exam was performed in seated and lying down position. Patient is status post left lumpectomy with a well-healed surgical scar. No evidence of any palpable masses. No evidence of axillary adenopathy. No evidence of any  palpable masses or lumps in the right breast. No evidence of right axillary adenopathy      Latest Ref Rng & Units 09/25/2022    6:00 PM  CMP  Glucose 70 - 99 mg/dL 84   BUN 8 - 23 mg/dL 30   Creatinine 2.72 - 1.00 mg/dL 5.36   Sodium 644 - 034 mmol/L 138   Potassium 3.5 - 5.1 mmol/L 3.9   Chloride 98 - 111 mmol/L 103   CO2 22 -  32 mmol/L 25   Calcium 8.9 - 10.3 mg/dL 8.9       Latest Ref Rng & Units 09/25/2022    6:00 PM  CBC  WBC 4.0 - 10.5 K/uL 5.0   Hemoglobin 12.0 - 15.0 g/dL 74.2   Hematocrit 59.5 - 46.0 % 38.6   Platelets 150 - 400 K/uL 188     Assessment and plan- Patient is a 72 y.o. female with history of pathological prognostic stage Ia invasive mammary tubular carcinoma of the left breast ER positive PR negative HER2 negative currently on letrozole here for routine follow-up  Patient is tolerating letrozole well without any significant side effects.  Clinically she is doing well with no concerning signs and symptoms of recurrence based on today's exam.  She is also taking weekly Fosamax along with calcium and vitamin D.  I will see her back in 6 months.  She would be due for a mammogram and bone density scan in June 2025 which we will schedule   Visit Diagnosis 1. Encounter for follow-up surveillance of breast cancer   2. Age-related osteoporosis without current pathological fracture   3. Use of letrozole (Femara)   4. High risk medication use      Dr. Owens Shark, MD, MPH [facility] [phone] 01/20/2023 5:09 PM

## 2023-02-05 ENCOUNTER — Other Ambulatory Visit: Payer: Self-pay | Admitting: Oncology

## 2023-02-17 ENCOUNTER — Other Ambulatory Visit: Payer: Self-pay | Admitting: Oncology

## 2023-02-17 DIAGNOSIS — M810 Age-related osteoporosis without current pathological fracture: Secondary | ICD-10-CM

## 2023-02-17 DIAGNOSIS — Z79811 Long term (current) use of aromatase inhibitors: Secondary | ICD-10-CM

## 2023-02-17 DIAGNOSIS — Z08 Encounter for follow-up examination after completed treatment for malignant neoplasm: Secondary | ICD-10-CM

## 2023-02-17 DIAGNOSIS — Z853 Personal history of malignant neoplasm of breast: Secondary | ICD-10-CM

## 2023-03-04 ENCOUNTER — Encounter: Payer: Self-pay | Admitting: Family Medicine

## 2023-03-04 ENCOUNTER — Inpatient Hospital Stay: Payer: Medicare Other | Attending: Oncology | Admitting: Occupational Therapy

## 2023-03-04 ENCOUNTER — Telehealth: Payer: Self-pay | Admitting: *Deleted

## 2023-03-04 DIAGNOSIS — I639 Cerebral infarction, unspecified: Secondary | ICD-10-CM

## 2023-03-04 NOTE — Therapy (Signed)
[facility] - A Dept Of [place]. [facility] [address], [address] [place], [place], [address] Phone: [phone]   Fax:  [phone]  Occupational Therapy Screen  Patient Details  Name: Natalie [name] MRN: [id number] Date of Birth: 1950/08/19 No data recorded  Encounter Date: 03/04/2023   OT End of Session - 03/04/23 1810     Visit Number 0             Past Medical History:  Diagnosis Date   Acute bronchospasm due to viral infection 2020   Due to pollen   Acute ischemic left MCA stroke (HCC) 04/19/2020   a.) CTA head/neck 04/19/2020 --> nearly occlusive thrombus at bifurcation of a proximal M2 MCA branch approximately 6 mm from the MCA bifurcation. Subsequent occlusion of a proximal left M3 MCA branch   Allergy    Anemia    Angina at rest [facility])    Breast cancer, left (HCC) 08/21/2021   a.) stereotactic Bx 08/21/2021 --> IMC (G1, ER/PR +, Her2/neu -)   Cataract    a.) s/p extraction with IOL placement   Depressive disorder    Diverticulosis    Dyspnea    Epistaxis    Fatigue    Hearing loss    History of 2019 novel coronavirus disease (COVID-19) 12/18/2020   History of ischemic multifocal posterior circulation stroke 02/28/2020   a.) presented to ED with increasing episodes of  transient vertical binocular diplopia; MRI 02/27/2021 --> multiple punctate acute to subacute ischemic infarcts involving the cortical/subcortical aspects of both parieto-occipital regions   History of loop recorder    History of radiation therapy    Vagina-02/11/21-03/14/21- Dr. Antony Blackbird   HLD (hyperlipidemia)    Hypothyroidism    LAFB (left anterior fascicular block)    Long term current use of antithrombotics/antiplatelets    a.) on DAPT therapy (ASA + clopidogrel)   Memory change    Migraine    Osteoarthritis    Osteoporosis    Palpitations    Personal history of radiation therapy    Premature menopause    QT prolongation     Seizures (HCC) 1994   history of mini seizures, possible migraine induced   Stage I adenocarcinoma of endometrium (HCC) 12/18/2020   a.) stage 1A; grade 1 (pT1a, pN0); b.) s/p TLH/BSO 12/18/2020 + adjuvant vaginal brachytherapy (30 Gy over 5 fractions between 02/11/2021 - 03/14/2021)   Vitamin D deficiency     Past Surgical History:  Procedure Laterality Date   ABDOMINAL HYSTERECTOMY  12/18/2020   ABLATION  2006   AXILLARY SENTINEL NODE BIOPSY Left 09/25/2021   Procedure: AXILLARY SENTINEL NODE BIOPSY;  Surgeon: Earline Mayotte, MD;  Location: [facility] ORS;  Service: General;  Laterality: Left;   BREAST BIOPSY Left 08/21/2021   Stereo Bx, X-clip; path --> IMC (G1, ER/PR +, Her2/neu -)   BREAST LUMPECTOMY Left 09/25/2021   BREAST LUMPECTOMY WITH NEEDLE LOCALIZATION Left 09/25/2021   Procedure: BREAST LUMPECTOMY WITH NEEDLE LOCALIZATION;  Surgeon: Earline Mayotte, MD;  Location: [facility] ORS;  Service: General;  Laterality: Left;   BUBBLE STUDY  04/23/2020   Procedure: BUBBLE STUDY;  Surgeon: Laurey Morale, MD;  Location: [facility] ENDOSCOPY;  Service: Cardiovascular;;   CATARACT EXTRACTION W/ INTRAOCULAR LENS IMPLANT Bilateral    COLONOSCOPY     COLONOSCOPY WITH PROPOFOL N/A 12/22/2018   Procedure: COLONOSCOPY WITH PROPOFOL;  Surgeon: Pasty Spillers, MD;  Location: [facility] ENDOSCOPY;  Service: Endoscopy;  Laterality: N/A;   EYE SURGERY  Spring 2018   Cataracts both eyes   HYSTEROSCOPY WITH D & C N/A 11/01/2020   Procedure: DILATATION AND CURETTAGE /HYSTEROSCOPY WITH MYOSURE;  Surgeon: Carver Fila, MD;  Location: [facility] ORS;  Service: Gynecology;  Laterality: N/A;   LAPAROTOMY  12/18/2020   Procedure: LAPAROTOMY;  Surgeon: Carver Fila, MD;  Location: [facility] ORS;  Service: Gynecology;;   LOOP RECORDER INSERTION N/A 04/23/2020   Procedure: LOOP RECORDER INSERTION;  Surgeon: Marinus Maw, MD;  Location: [facility] INVASIVE CV LAB;  Service: Cardiovascular;  Laterality: N/A;   ROBOTIC  ASSISTED TOTAL HYSTERECTOMY WITH BILATERAL SALPINGO OOPHERECTOMY Bilateral 12/18/2020   Procedure: XI ROBOTIC ASSISTED TOTAL HYSTERECTOMY WITH BILATERAL SALPINGO OOPHORECTOMY;  Surgeon: Carver Fila, MD;  Location: [facility] ORS;  Service: Gynecology;  Laterality: Bilateral;   SENTINEL NODE BIOPSY N/A 12/18/2020   Procedure: SENTINEL NODE BIOPSY;  Surgeon: Carver Fila, MD;  Location: [facility] ORS;  Service: Gynecology;  Laterality: N/A;   TEE WITHOUT CARDIOVERSION N/A 04/23/2020   Procedure: TRANSESOPHAGEAL ECHOCARDIOGRAM (TEE);  Surgeon: Laurey Morale, MD;  Location: [facility] ENDOSCOPY;  Service: Cardiovascular;  Laterality: N/A;   TONSILLECTOMY      There were no vitals filed for this visit.   Subjective Assessment - 03/04/23 1808     Subjective  Since I have seen you in the past my breast cancer had 3 light strokes in had physical therapy.  I did not keep up with my exercises going to the gym.  Since September.  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 No/denies             10/13/22 [facility] note; Intermittent episodes of dizziness; recently seen in emergency department due to dizziness (09/25/2022) while driving in patient with history of migraine, history of multiple strokes, and history of moderate obstructive sleep apnea. Seen in Emergency Department on 09/25/2022 due to dizziness. Per report, no "concerning neurofindings"   OT SCREEN 03/04/23: Patient was seen by this OT in the past after her left lumpectomy on 09/25/2021. Since then patient had 3 light strokes.  Was seen by physical therapy earlier last year finishing therapy 5/24. Referred to community gym. Patient self-referred to me after participating in tai chi class at the cancer center Patient reports she stopped going to the gym around September last year.  Did see Dr. Clelia Croft the neurologist in September. Patient feels like her balance is not as  good. She did not had any falls.  She is walking her dog. And active in community. BERG balance test score 45/56 - low risk for falling -but report and show decreased strength in left lower extremity.  Difficulty with standing on 1 leg on the left, standing tandem needs assistance to step to hold it, fatigue with alternating foot on stepstool needing some support and has to strain little bit with back to stand up out of chair without arms. 3.86 up and go 3 yards Discussed with patient getting back in the routine for exercise.  Patient interested in [facility] -that is a supervised exercise cancer therapy program that will also help her to be more accountable and get her stronger for the spring and summer.  Visit Diagnosis: Recurrent strokes [facility])    Problem List Patient Active Problem List   Diagnosis Date Noted   TIA on medication 09/12/2022   B12 deficiency 09/12/2022   History of malignant neoplasm of endometrium 09/12/2022   Trigger finger, right ring finger 09/10/2021   Dupuytren's contracture of both hands 09/10/2021   Vitamin D deficiency 09/10/2021   Malignant neoplasm of left breast in female, estrogen receptor positive (HCC) 09/01/2021   Dyslipidemia 05/16/2021   Major depression, recurrent, chronic (HCC) 03/13/2021   Endometrial cancer (HCC) 12/26/2020   Postoperative anemia due to acute blood loss 12/19/2020   History of CVA (cerebrovascular accident) 04/19/2020   History of ischemic multifocal posterior circulation stroke 02/29/2020   Difficulty hearing 06/28/2015   Arthritis, degenerative 06/28/2015   Purpura, nonthrombopenic (HCC) 06/28/2015   Adult hypothyroidism 08/09/2014   Migraine without aura and without status migrainosus, not intractable 09/19/2008   OP (osteoporosis) 09/07/2006    Oletta Cohn, OTR/L,CLT 03/04/2023, 6:14 PM  [facility] - A Dept Of  [place]. [facility] [address] [place], [place], [address] Phone: [phone]   Fax:  [phone]  Name: Natalie [name] MRN: [id number] Date of Birth: 12-Nov-1950

## 2023-03-04 NOTE — Telephone Encounter (Signed)
Moved the patient's appt from 2/14 to 2/13 at 3:45 pm. Patient aware

## 2023-03-05 ENCOUNTER — Other Ambulatory Visit: Payer: Self-pay

## 2023-03-05 ENCOUNTER — Other Ambulatory Visit: Payer: Self-pay | Admitting: Family Medicine

## 2023-03-05 DIAGNOSIS — Z08 Encounter for follow-up examination after completed treatment for malignant neoplasm: Secondary | ICD-10-CM

## 2023-03-05 DIAGNOSIS — I639 Cerebral infarction, unspecified: Secondary | ICD-10-CM

## 2023-03-05 DIAGNOSIS — Z1211 Encounter for screening for malignant neoplasm of colon: Secondary | ICD-10-CM

## 2023-03-05 DIAGNOSIS — C50919 Malignant neoplasm of unspecified site of unspecified female breast: Secondary | ICD-10-CM

## 2023-03-06 ENCOUNTER — Telehealth: Payer: Self-pay

## 2023-03-06 DIAGNOSIS — Z860100 Personal history of colon polyps, unspecified: Secondary | ICD-10-CM

## 2023-03-06 NOTE — Telephone Encounter (Signed)
Colonoscopy referral received.  Pt has cardiac history.  Will get cardiac clearance from Dr. Juliann Pares prior to contacting to schedule her colonoscopy.    Dr.Sowles has already advised her to hold aspirin 1 week prior to procedure.  Noted on 03/04/23 patient message.  Will follow up with [facility] next week to check status of clearance.  Thanks,  [place], [place]

## 2023-03-19 ENCOUNTER — Inpatient Hospital Stay: Payer: Medicare Other | Attending: Oncology | Admitting: Gynecologic Oncology

## 2023-03-19 ENCOUNTER — Encounter: Payer: Self-pay | Admitting: Gynecologic Oncology

## 2023-03-19 ENCOUNTER — Telehealth: Payer: Self-pay

## 2023-03-19 VITALS — BP 124/73 | HR 74 | Temp 98.9°F | Resp 19 | Wt 154.8 lb

## 2023-03-19 DIAGNOSIS — Z08 Encounter for follow-up examination after completed treatment for malignant neoplasm: Secondary | ICD-10-CM | POA: Diagnosis not present

## 2023-03-19 DIAGNOSIS — Z79811 Long term (current) use of aromatase inhibitors: Secondary | ICD-10-CM | POA: Diagnosis not present

## 2023-03-19 DIAGNOSIS — R3915 Urgency of urination: Secondary | ICD-10-CM

## 2023-03-19 DIAGNOSIS — Z90722 Acquired absence of ovaries, bilateral: Secondary | ICD-10-CM | POA: Diagnosis not present

## 2023-03-19 DIAGNOSIS — C50912 Malignant neoplasm of unspecified site of left female breast: Secondary | ICD-10-CM | POA: Diagnosis not present

## 2023-03-19 DIAGNOSIS — Z1732 Human epidermal growth factor receptor 2 negative status: Secondary | ICD-10-CM | POA: Diagnosis not present

## 2023-03-19 DIAGNOSIS — Z923 Personal history of irradiation: Secondary | ICD-10-CM | POA: Diagnosis not present

## 2023-03-19 DIAGNOSIS — N3941 Urge incontinence: Secondary | ICD-10-CM | POA: Diagnosis not present

## 2023-03-19 DIAGNOSIS — Z90710 Acquired absence of both cervix and uterus: Secondary | ICD-10-CM | POA: Insufficient documentation

## 2023-03-19 DIAGNOSIS — Z9079 Acquired absence of other genital organ(s): Secondary | ICD-10-CM | POA: Diagnosis not present

## 2023-03-19 DIAGNOSIS — C541 Malignant neoplasm of endometrium: Secondary | ICD-10-CM

## 2023-03-19 DIAGNOSIS — Z170 Estrogen receptor positive status [ER+]: Secondary | ICD-10-CM | POA: Diagnosis not present

## 2023-03-19 DIAGNOSIS — Z8542 Personal history of malignant neoplasm of other parts of uterus: Secondary | ICD-10-CM | POA: Insufficient documentation

## 2023-03-19 NOTE — Progress Notes (Signed)
Gynecologic Oncology Return Clinic Visit  03/19/23  Reason for Visit: follow-up  Treatment History: Oncology History Overview Note  MMR IHC normal   Endometrial cancer (HCC)  04/2020 Imaging   CT A/P in 04/2020 showed enlarged fibroid uterus; complex cystic mass in left ovary measuring up to 5cm. Pelvic ultrasound 05/01/20: Enlarged multi-fibroid uterus, endometrium 3.67mm. Left ovary measures up to 7cm with a 5.8 x 4.1 x 4cm dominant septated cyst.  CA-125   04/2020 Tumor Marker   Patient's tumor was tested for the following markers: CA-125. Results of the tumor marker test revealed 592.   08/10/2020 Imaging   Pelvic ultrasound showed thickened heterogenous endometrial complex with somewhat nodular margins associated with a 3.8 cm diameter fluid collection within the endometrial canal.  8.2 cm cyst of the left ovary.   08/2020 Tumor Marker   Patient's tumor was tested for the following markers: CA-125. Results of the tumor marker test revealed 731.   08/27/2020 Initial Biopsy   EMB showing minute fragments of benign inactive endometrium, mostly blood and fibrin   11/01/2020 Surgery   D&C hysteroscopy, and hysteroscopic sampling of the endometrium.    Final pathology showed at least complex atypical hyperplasia.   11/29/2020 Imaging   Pelvic ultrasound shows thickened endometrial lining measuring 4.4 cm.  Normal-appearing right ovary.  Complex left adnexal cyst measures up to f 5.3 cm.  No flow seen   12/18/2020 Surgery   TRH/BSO, SLN injection with biopsy on let (no mapping on right), mini-lap for specimen removal, repair vaginal lacerations  Findings: On EUA, large globular uterus. On intra-abdominal entry, normal upper abdominal survey.Normal omentum, small and large bowel. Uterus 10cm, very globular and bulbous at the fundus. Multiple fibroids including 3cm lower uterine segment anterior fibroid and posterior 6cm fundal fibroid. Right adnexa normal and atrophic. Mapping to channels  along the right broad ligament, no obvious SLN. On the left, mapping successful to external iliac SLN. Left ovary replaced by 6cm cyst adherent to the broad ligament and sigmoid mesentery with old blood versus necrotic tissue within (cyst wall very easily ripped open with minimal manipulation of the adnexa. Second 2cm cystic component. No obvious findings of malignancy but would be stage II if cancer given adherence to surrounding pelvic structures. Some adhesions between the bladder and cervix. No obvious adenopathy. No ascites. Mini-lap required for specimen removal given size.  No frozen sent as it was not going to impact management. Goal was to keep the surgery under or close to 2 hours.   12/18/2020 Pathology Results   A. LYMPH NODE, LEFT EXTERNAL ILIAC SENTINAL, BIOPSY:  - One lymph node, negative for malignancy (0/1).   B. UTERUS, CERVIX,  BILATERAL TUBES, HYSTERECTOMY AND BILATERAL  SALPINGECTOMY:  - Endometrium:       - Endometrioid endometrial adenocarcinoma, low-grade, arising in a  background of endometrioid intraepithelial neoplasia (EIN), with  extensive colonization of adenomyosis.       - See oncology table.  - Myometrium:       - Adenomyosis, involved by endometrioid endometrial adenocarcinoma.       - Leiomyomata uteri.  - Uterine cervix:       - Benign transformation zone.       - Negative for squamous intraepithelial lesion and malignancy.  - Fallopian tubes:       - No significant histopathologic change.  - Ovaries:       - Benign physiologic changes.   COMMENT:   A. An immunohistochemical study for pancytokeratin is  negative. There is  no evidence of metastatic carcinoma.   B. Immunohistochemical studies show the tumor cells to be positive for  PR, and negative for Napsin A.  These findings would favor the above  diagnosis, and suggest against clear cell carcinoma. The definite extent  of myometrial invasion is less than 50%, but cannot be further   characterized, due to initial sampling of the entire myometrial surface,  and extensive colonization of adenomyosis throughout the myometrium  impacting evaluation of the orientation of these fragments. Full  thickness sections are without definite direct myometrial invasion, but  show extensive adenomyosis   UTERUS, CARCINOMA OR CARCINOSARCOMA: Resection   Procedure: Total hysterectomy with bilateral salpingo-oophorectomy  Histologic Type: Endometrioid carcinoma, NOS  Histologic Grade: Low-grade  Myometrial Invasion:       Cannot be determined  Uterine Serosa Involvement: Not identified  Cervical stromal Involvement: Not identified  Extent of involvement of other tissue/organs: Not identified  Peritoneal/Ascitic Fluid: Not submitted/unknown  Lymphovascular Invasion: Not identified  Regional Lymph Nodes: Regional lymph nodes present  All regional lymph nodes negative for tumor cells       Pelvic Lymph Nodes Examined:  Sentinel: 1               Non-sentinel: 0               Total: 1       Pelvic Lymph Nodes with Metastasis: 0                          Macrometastasis: (>2.0 mm): 0                          Micrometastasis: (>0.2 mm and < 2.0 mm): 0                          Isolated Tumor Cells (<0.2 mm): 0                          Laterality of Lymph Node with Tumor: Not  applicable                             Extracapsular Extension: Not applicable    12/26/2020 Initial Diagnosis   Endometrial cancer (HCC)   02/11/2021 - 03/14/2021 Radiation Therapy   02/11/2021 through 03/14/2021 Site Technique Total Dose (Gy) Dose per Fx (Gy) Completed Fx Beam Energies  Vagina: Pelvis HDR-brachy 30/30 6 5/5 Ir-192       Malignant neoplasm of left breast in female, estrogen receptor positive (HCC)  09/01/2021 Initial Diagnosis   Malignant neoplasm of left breast in female, estrogen receptor positive (HCC)   10/02/2021 Cancer Staging   Staging form: Breast, AJCC 8th Edition - Pathologic stage  from 10/02/2021: Stage IA (pT1a, pN0, cM0, G1, ER+, PR+, HER2-) - Signed by Creig Hines, MD on 10/02/2021 Multigene prognostic tests performed: None Histologic grading system: 3 grade system     Interval History: Doing well. Denies vaginal bleeding, discharge. Having some frequency, urgency and incontinence. Denies any feelings of UTI. Reports baseline bowel function. Denies abdominal pain.   Past Medical/Surgical History: Past Medical History:  Diagnosis Date   Acute bronchospasm due to viral infection 2020   Due to pollen   Acute ischemic left MCA stroke (HCC) 04/19/2020   a.) CTA head/neck  04/19/2020 --> nearly occlusive thrombus at bifurcation of a proximal M2 MCA branch approximately 6 mm from the MCA bifurcation. Subsequent occlusion of a proximal left M3 MCA branch   Allergy    Anemia    Angina at rest [facility])    Breast cancer, left (HCC) 08/21/2021   a.) stereotactic Bx 08/21/2021 --> IMC (G1, ER/PR +, Her2/neu -)   Cataract    a.) s/p extraction with IOL placement   Depressive disorder    Diverticulosis    Dyspnea    Epistaxis    Fatigue    Hearing loss    History of 2019 novel coronavirus disease (COVID-19) 12/18/2020   History of ischemic multifocal posterior circulation stroke 02/28/2020   a.) presented to ED with increasing episodes of  transient vertical binocular diplopia; MRI 02/27/2021 --> multiple punctate acute to subacute ischemic infarcts involving the cortical/subcortical aspects of both parieto-occipital regions   History of loop recorder    History of radiation therapy    Vagina-02/11/21-03/14/21- Dr. Antony Blackbird   HLD (hyperlipidemia)    Hypothyroidism    LAFB (left anterior fascicular block)    Long term current use of antithrombotics/antiplatelets    a.) on DAPT therapy (ASA + clopidogrel)   Memory change    Migraine    Osteoarthritis    Osteoporosis    Palpitations    Personal history of radiation therapy    Premature menopause    QT  prolongation    Seizures (HCC) 1994   history of mini seizures, possible migraine induced   Stage I adenocarcinoma of endometrium (HCC) 12/18/2020   a.) stage 1A; grade 1 (pT1a, pN0); b.) s/p TLH/BSO 12/18/2020 + adjuvant vaginal brachytherapy (30 Gy over 5 fractions between 02/11/2021 - 03/14/2021)   Vitamin D deficiency     Past Surgical History:  Procedure Laterality Date   ABDOMINAL HYSTERECTOMY  12/18/2020   ABLATION  2006   AXILLARY SENTINEL NODE BIOPSY Left 09/25/2021   Procedure: AXILLARY SENTINEL NODE BIOPSY;  Surgeon: Earline Mayotte, MD;  Location: [facility] ORS;  Service: General;  Laterality: Left;   BREAST BIOPSY Left 08/21/2021   Stereo Bx, X-clip; path --> IMC (G1, ER/PR +, Her2/neu -)   BREAST LUMPECTOMY Left 09/25/2021   BREAST LUMPECTOMY WITH NEEDLE LOCALIZATION Left 09/25/2021   Procedure: BREAST LUMPECTOMY WITH NEEDLE LOCALIZATION;  Surgeon: Earline Mayotte, MD;  Location: [facility] ORS;  Service: General;  Laterality: Left;   BUBBLE STUDY  04/23/2020   Procedure: BUBBLE STUDY;  Surgeon: Laurey Morale, MD;  Location: [facility] ENDOSCOPY;  Service: Cardiovascular;;   CATARACT EXTRACTION W/ INTRAOCULAR LENS IMPLANT Bilateral    COLONOSCOPY     COLONOSCOPY WITH PROPOFOL N/A 12/22/2018   Procedure: COLONOSCOPY WITH PROPOFOL;  Surgeon: Pasty Spillers, MD;  Location: [facility] ENDOSCOPY;  Service: Endoscopy;  Laterality: N/A;   EYE SURGERY  Spring 2018   Cataracts both eyes   HYSTEROSCOPY WITH D & C N/A 11/01/2020   Procedure: DILATATION AND CURETTAGE /HYSTEROSCOPY WITH MYOSURE;  Surgeon: Carver Fila, MD;  Location: [facility] ORS;  Service: Gynecology;  Laterality: N/A;   LAPAROTOMY  12/18/2020   Procedure: LAPAROTOMY;  Surgeon: Carver Fila, MD;  Location: [facility] ORS;  Service: Gynecology;;   LOOP RECORDER INSERTION N/A 04/23/2020   Procedure: LOOP RECORDER INSERTION;  Surgeon: Marinus Maw, MD;  Location: [facility] CV LAB;  Service: Cardiovascular;  Laterality: N/A;    ROBOTIC ASSISTED TOTAL HYSTERECTOMY WITH BILATERAL SALPINGO OOPHERECTOMY Bilateral 12/18/2020   Procedure: XI ROBOTIC  ASSISTED TOTAL HYSTERECTOMY WITH BILATERAL SALPINGO OOPHORECTOMY;  Surgeon: Carver Fila, MD;  Location: [facility] ORS;  Service: Gynecology;  Laterality: Bilateral;   SENTINEL NODE BIOPSY N/A 12/18/2020   Procedure: SENTINEL NODE BIOPSY;  Surgeon: Carver Fila, MD;  Location: [facility] ORS;  Service: Gynecology;  Laterality: N/A;   TEE WITHOUT CARDIOVERSION N/A 04/23/2020   Procedure: TRANSESOPHAGEAL ECHOCARDIOGRAM (TEE);  Surgeon: Laurey Morale, MD;  Location: [facility] ENDOSCOPY;  Service: Cardiovascular;  Laterality: N/A;   TONSILLECTOMY      Family History  Problem Relation Age of Onset   Early death Father        suicide   Depression Father    Diabetes Father    Cancer Father        prostate   Hearing loss Father        due to war   Alzheimer's disease Mother    Heart disease Brother    Atrial fibrillation Brother    Heart disease Maternal Aunt    Dementia Maternal Aunt    Heart disease Maternal Uncle    Dementia Maternal Grandmother    Heart disease Brother    Atrial fibrillation Brother    Colon cancer Neg Hx    Breast cancer Neg Hx    Ovarian cancer Neg Hx    Pancreatic cancer Neg Hx    Endometrial cancer Neg Hx     Social History   Socioeconomic History   Marital status: Single    Spouse name: Not on file   Number of children: 1   Years of education: Not on file   Highest education level: Bachelor's degree (e.g., BA, AB, BS)  Occupational History   Occupation: retired   Tobacco Use   Smoking status: Former    Current packs/day: 0.00    Average packs/day: 0.5 packs/day for 6.0 years (3.0 ttl pk-yrs)    Types: Cigarettes    Start date: 02/12/1968    Quit date: 02/11/1974    Years since quitting: 49.1   Smokeless tobacco: Never  Vaping Use   Vaping status: Never Used  Substance and Sexual Activity   Alcohol use: Yes    Comment: rare   Drug  use: Never   Sexual activity: Not Currently    Comment: Don't use not sexually active  Other Topics Concern   Not on file  Social History Narrative   Raised an adopted child on her own   Working part time reviewed documents   Social Drivers of Corporate investment banker Strain: Low Risk  (04/03/2022)   Overall Financial Resource Strain (CARDIA)    Difficulty of Paying Living Expenses: Not very hard  Food Insecurity: No Food Insecurity (04/03/2022)   Hunger Vital Sign    Worried About Running Out of Food in the Last Year: Never true    Ran Out of Food in the Last Year: Never true  Transportation Needs: No Transportation Needs (04/03/2022)   PRAPARE - Administrator, Civil Service (Medical): No    Lack of Transportation (Non-Medical): No  Physical Activity: Insufficiently Active (04/03/2022)   Exercise Vital Sign    Days of Exercise per Week: 2 days    Minutes of Exercise per Session: 30 min  Stress: No Stress Concern Present (04/03/2022)   [employer] of Occupational Health - Occupational Stress Questionnaire    Feeling of Stress : Not at all  Social Connections: Moderately Integrated (04/03/2022)   Social Connection and Isolation Panel [NHANES]  Frequency of Communication with Friends and Family: Three times a week    Frequency of Social Gatherings with Friends and Family: More than three times a week    Attends Religious Services: More than 4 times per year    Active Member of Clubs or Organizations: Yes    Attends Engineer, structural: More than 4 times per year    Marital Status: Never married    Current Medications:  Current Outpatient Medications:    alendronate (FOSAMAX) 70 MG tablet, TAKE 1 TABLET(70 MG) BY MOUTH 1 TIME A WEEK WITH A FULL GLASS OF WATER AND ON AN EMPTY STOMACH, Disp: 12 tablet, Rfl: 0   aspirin 81 MG EC tablet, Take by mouth., Disp: , Rfl:    aspirin-acetaminophen-caffeine (EXCEDRIN MIGRAINE) 250-250-65 MG tablet, Take by  mouth every 6 (six) hours as needed for headache or migraine., Disp: , Rfl:    atorvastatin (LIPITOR) 40 MG tablet, Take 1 tablet (40 mg total) by mouth daily., Disp: 90 tablet, Rfl: 2   buPROPion (WELLBUTRIN SR) 150 MG 12 hr tablet, Take 1 tablet (150 mg total) by mouth 2 (two) times daily., Disp: 180 tablet, Rfl: 2   calcium carbonate (OSCAL) 1500 (600 Ca) MG TABS tablet, Take 600 mg of elemental calcium by mouth 1 day or 1 dose., Disp: , Rfl:    cholecalciferol (VITAMIN D3) 25 MCG (1000 UNIT) tablet, Take 1,000 Units by mouth daily., Disp: , Rfl:    citalopram (CELEXA) 40 MG tablet, Take 1 tablet (40 mg total) by mouth daily., Disp: 90 tablet, Rfl: 2   Cyanocobalamin (VITAMIN B-12) 1000 MCG SUBL, Place 1 tablet (1,000 mcg total) under the tongue 2 (two) times a week., Disp: 30 tablet, Rfl: 0   letrozole (FEMARA) 2.5 MG tablet, TAKE 1 TABLET(2.5 MG) BY MOUTH DAILY, Disp: 90 tablet, Rfl: 1   levothyroxine (SYNTHROID) 75 MCG tablet, Take 1 tablet (75 mcg total) by mouth daily before breakfast., Disp: 90 tablet, Rfl: 2   Multiple Vitamin (MULTIVITAMIN WITH MINERALS) TABS tablet, Take 1 tablet by mouth daily., Disp: , Rfl:    Ubrogepant (UBRELVY) 100 MG TABS, Take 100 mg by mouth daily as needed (migraines)., Disp: , Rfl:   Review of Systems: + frequency, incontinence, ringing in ears Denies appetite changes, fevers, chills, fatigue, unexplained weight changes. Denies hearing loss, neck lumps or masses, mouth sores or voice changes. Denies cough or wheezing.  Denies shortness of breath. Denies chest pain or palpitations. Denies leg swelling. Denies abdominal distention, pain, blood in stools, constipation, diarrhea, nausea, vomiting, or early satiety. Denies pain with intercourse, dysuria, hematuria. Denies hot flashes, pelvic pain, vaginal bleeding or vaginal discharge.   Denies joint pain, back pain or muscle pain/cramps. Denies itching, rash, or wounds. Denies dizziness, headaches, numbness  or seizures. Denies swollen lymph nodes or glands, denies easy bruising or bleeding. Denies anxiety, depression, confusion, or decreased concentration.  Physical Exam: BP 124/73 (BP Location: Left Arm, Patient Position: Sitting)   Pulse 74   Temp 98.9 F (37.2 C) (Oral)   Resp 19   Wt 154 lb 12.8 oz (70.2 kg)   SpO2 98%   BMI 25.76 kg/m  General: Alert, oriented, no acute distress. HEENT: Normocephalic, atraumatic, sclera anicteric. Chest: Clear to auscultation bilaterally.  No wheezes or rhonchi. Cardiovascular: Regular rate and rhythm, no murmurs. Abdomen: soft, nontender.  Normoactive bowel sounds.  No masses or hepatosplenomegaly appreciated.  Well-healed incisions. Extremities: Grossly normal range of motion.  Warm, well perfused.  No  edema bilaterally. Skin: No rashes or lesions noted. Lymphatics: No cervical, supraclavicular, or inguinal adenopathy. GU: Normal appearing external genitalia without erythema, excoriation, or lesions.  Speculum exam reveals moderately atrophic vaginal mucosa, some radiation changes present.  Introitus itself was quite narrow and requires small speculum.  Minimal agglutination towards the apex of the vagina.  Bimanual exam reveals cuff is smooth with, no nodularity or masses.  Rectovaginal exam confirms findings.  Laboratory & Radiologic Studies: None new  Assessment & Plan: Natalie [name] is a 73 y.o. woman with Stage 1A grade 1 endometrioid endometrial adenocarcinoma, lymph node assessment deferred.  MMR intact, MSS. Difficulty in determining invasion (estimated at <50%) and size of tumor given adenomyosis. No LVI. Given large area of myometrium involved, tumor size suspected to be larger.  Adjuvant brachytherapy was recommended which was completed in February 2023.   Patient is overall doing well and is NED on exam today. Vaginal dilator use encouraged.   Per NCCN surveillance recommendations, we will plan on visits every 3 months  alternating between our clinic and radiation oncology for 2 years and then visits every 6 months.  After her next appointment with radiation oncology, we will transition to visits every 6 months.  I will see her next in October.  Discussed treatment for urgency and urge incontinence. She is not very bothered by her symptoms and would not be interested in medication at this time. Offered to send urine for UA and culture; she does not feel that symptoms represent a UTI.  She will call me if symptoms become more bothersome.   We reviewed signs and symptoms that would be concerning for cancer recurrence, and I encouraged the patient to call if she develops any of these.  20 minutes of total time was spent for this patient encounter, including preparation, face-to-face counseling with the patient and coordination of care, and documentation of the encounter.  Eugene Garnet, MD  Division of Gynecologic Oncology  Department of Obstetrics and Gynecology  [place] [facility]

## 2023-03-19 NOTE — Telephone Encounter (Signed)
Patient called and stated she didn't know whether she was going to get here or not today.  Advised I could move her to Dr Jean Rosenthal Moore's schedule but she said no that she wanted to stay and she was trying to get here.York Spaniel she would call around 3 if she needs to cancel.

## 2023-03-20 ENCOUNTER — Other Ambulatory Visit: Payer: Self-pay

## 2023-03-20 ENCOUNTER — Ambulatory Visit: Payer: Medicare Other | Admitting: Gynecologic Oncology

## 2023-03-20 DIAGNOSIS — Z860100 Personal history of colon polyps, unspecified: Secondary | ICD-10-CM

## 2023-03-20 MED ORDER — NA SULFATE-K SULFATE-MG SULF 17.5-3.13-1.6 GM/177ML PO SOLN: 1.0000 | Freq: Once | ORAL | 0 refills | Status: AC

## 2023-03-20 NOTE — Addendum Note (Signed)
Addended by: Avie Arenas on: 03/20/2023 10:03 AM   Modules accepted: Orders

## 2023-03-20 NOTE — Telephone Encounter (Signed)
Gastroenterology Pre-Procedure Review  Request Date: 04/16/23 Requesting Physician: Dr. Allegra Lai  PATIENT REVIEW QUESTIONS: The patient responded to the following health history questions as indicated:    1. Are you having any GI issues? no 2. Do you have a personal history of Polyps? Yes last colonoscopy performed by Dr. Raenette Rover 12/22/2018 3. Do you have a family history of Colon Cancer or Polyps? no 4. Diabetes Mellitus? no 5. Joint replacements in the past 12 months?no 6. Major health problems in the past 3 months?no 7. Any artificial heart valves, MVP, or defibrillator?no    MEDICATIONS & ALLERGIES:    Patient reports the following regarding taking any anticoagulation/antiplatelet therapy:   Plavix, Coumadin, Eliquis, Xarelto, Lovenox, Pradaxa, Brilinta, or Effient? no Aspirin? 81 mg daily  Patient confirms/reports the following medications:  Current Outpatient Medications  Medication Sig Dispense Refill   alendronate (FOSAMAX) 70 MG tablet TAKE 1 TABLET(70 MG) BY MOUTH 1 TIME A WEEK WITH A FULL GLASS OF WATER AND ON AN EMPTY STOMACH 12 tablet 0   aspirin 81 MG EC tablet Take by mouth.     aspirin-acetaminophen-caffeine (EXCEDRIN MIGRAINE) 250-250-65 MG tablet Take by mouth every 6 (six) hours as needed for headache or migraine.     atorvastatin (LIPITOR) 40 MG tablet Take 1 tablet (40 mg total) by mouth daily. 90 tablet 2   buPROPion (WELLBUTRIN SR) 150 MG 12 hr tablet Take 1 tablet (150 mg total) by mouth 2 (two) times daily. 180 tablet 2   calcium carbonate (OSCAL) 1500 (600 Ca) MG TABS tablet Take 600 mg of elemental calcium by mouth 1 day or 1 dose.     cholecalciferol (VITAMIN D3) 25 MCG (1000 UNIT) tablet Take 1,000 Units by mouth daily.     citalopram (CELEXA) 40 MG tablet Take 1 tablet (40 mg total) by mouth daily. 90 tablet 2   Cyanocobalamin (VITAMIN B-12) 1000 MCG SUBL Place 1 tablet (1,000 mcg total) under the tongue 2 (two) times a week. 30 tablet 0   letrozole  (FEMARA) 2.5 MG tablet TAKE 1 TABLET(2.5 MG) BY MOUTH DAILY 90 tablet 1   levothyroxine (SYNTHROID) 75 MCG tablet Take 1 tablet (75 mcg total) by mouth daily before breakfast. 90 tablet 2   Multiple Vitamin (MULTIVITAMIN WITH MINERALS) TABS tablet Take 1 tablet by mouth daily.     Ubrogepant (UBRELVY) 100 MG TABS Take 100 mg by mouth daily as needed (migraines).     No current facility-administered medications for this visit.    Patient confirms/reports the following allergies:  Allergies  Allergen Reactions   Sulfamethoxazole-Trimethoprim Hives   Tnkase [Tenecteplase] Swelling    Mild focal left upper lip swelling    No orders of the defined types were placed in this encounter.   AUTHORIZATION INFORMATION Primary Insurance: 1D#: Group #:  Secondary Insurance: 1D#: Group #:  SCHEDULE INFORMATION: Date: 04/16/23 Time: Location: [facility]

## 2023-03-20 NOTE — Telephone Encounter (Signed)
Cardiac clearance has been received from Dr. Roslynn Amble office.  Pt is cleared to have procedure and mya hold aspirin 5 days before.  LVM for patient call office to schedule.  Thanks, [place], [place]

## 2023-03-26 ENCOUNTER — Ambulatory Visit: Payer: Medicare Other | Admitting: Gynecologic Oncology

## 2023-03-31 ENCOUNTER — Encounter: Payer: Medicare Other | Attending: Oncology

## 2023-03-31 VITALS — Ht 66.1 in | Wt 155.6 lb

## 2023-03-31 DIAGNOSIS — C50919 Malignant neoplasm of unspecified site of unspecified female breast: Secondary | ICD-10-CM

## 2023-03-31 NOTE — Patient Instructions (Signed)
 Patient Instructions  Patient Details  Name: Natalie [name] MRN: [id number] Date of Birth: 1950-11-01 Referring Provider:  Creig Hines, MD  Below are your personal goals for exercise, nutrition, and risk factors. Our goal is to help you stay on track towards obtaining and maintaining these goals. We will be discussing your progress on these goals with you throughout the program.  Initial Exercise Prescription:  Initial Exercise Prescription - 03/31/23 1400       Date of Initial Exercise RX and Referring Provider   Date 03/31/23    Referring Provider Owens Shark, MD      Oxygen   Maintain Oxygen Saturation 88% or higher      Treadmill   MPH 2.5   try   Grade 0    Minutes 15    METs 2.91      Recumbant Bike   Level 2    RPM 50    Watts 22    Minutes 15    METs 3.01      NuStep   Level 3    SPM 80    Minutes 15    METs 3.01      REL-XR   Level 2    Speed 50    Minutes 15    METs 3.01      Track   Laps 37    Minutes 15    METs 3.01      Prescription Details   Frequency (times per week) 2    Duration Progress to 30 minutes of continuous aerobic without signs/symptoms of physical distress      Intensity   THRR 40-80% of Max Heartrate 103-132    Ratings of Perceived Exertion 11-13    Perceived Dyspnea 0-4      Progression   Progression Continue to progress workloads to maintain intensity without signs/symptoms of physical distress.      Resistance Training   Training Prescription Yes    Weight 3 lb    Reps 10-15             Exercise Goals: Frequency: Be able to perform aerobic exercise two to three times per week in program working toward 2-5 days per week of home exercise.  Intensity: Work with a perceived exertion of 11 (fairly light) - 15 (hard) while following your exercise prescription.  We will make changes to your prescription with you as you progress through the program.   Duration: Be able to do 30 to 45 minutes of continuous  aerobic exercise in addition to a 5 minute warm-up and a 5 minute cool-down routine.   Nutrition Goals: Your personal nutrition goals will be established when you do your nutrition analysis with the dietician.  The following are general nutrition guidelines to follow: Cholesterol < 200mg /day Sodium < 1500mg /day Fiber: Women over 50 yrs - 21 grams per day  Exercise Goals and Review:  Exercise Goals     Row Name 03/31/23 1439             Exercise Goals   Increase Physical Activity Yes       Intervention Provide advice, education, support and counseling about physical activity/exercise needs.;Develop an individualized exercise prescription for aerobic and resistive training based on initial evaluation findings, risk stratification, comorbidities and participant's personal goals.       Expected Outcomes Short Term: Attend rehab on a regular basis to increase amount of physical activity.;Long Term: Add in home exercise to make exercise part of routine and  to increase amount of physical activity.;Long Term: Exercising regularly at least 3-5 days a week.       Increase Strength and Stamina Yes       Intervention Provide advice, education, support and counseling about physical activity/exercise needs.;Develop an individualized exercise prescription for aerobic and resistive training based on initial evaluation findings, risk stratification, comorbidities and participant's personal goals.       Expected Outcomes Short Term: Increase workloads from initial exercise prescription for resistance, speed, and METs.;Short Term: Perform resistance training exercises routinely during rehab and add in resistance training at home;Long Term: Improve cardiorespiratory fitness, muscular endurance and strength as measured by increased METs and functional capacity ( )       Able to understand and use rate of perceived exertion (RPE) scale Yes       Intervention Provide education and explanation on how to use  RPE scale       Expected Outcomes Short Term: Able to use RPE daily in rehab to express subjective intensity level;Long Term:  Able to use RPE to guide intensity level when exercising independently       Able to understand and use Dyspnea scale Yes       Intervention Provide education and explanation on how to use Dyspnea scale       Expected Outcomes Short Term: Able to use Dyspnea scale daily in rehab to express subjective sense of shortness of breath during exertion;Long Term: Able to use Dyspnea scale to guide intensity level when exercising independently       Knowledge and understanding of Target Heart Rate Range (THRR) Yes       Intervention Provide education and explanation of THRR including how the numbers were predicted and where they are located for reference       Expected Outcomes Short Term: Able to state/look up THRR;Short Term: Able to use daily as guideline for intensity in rehab;Long Term: Able to use THRR to govern intensity when exercising independently       Able to check pulse independently Yes       Intervention Provide education and demonstration on how to check pulse in carotid and radial arteries.;Review the importance of being able to check your own pulse for safety during independent exercise       Expected Outcomes Short Term: Able to explain why pulse checking is important during independent exercise;Long Term: Able to check pulse independently and accurately       Understanding of Exercise Prescription Yes       Intervention Provide education, explanation, and written materials on patient's individual exercise prescription       Expected Outcomes Short Term: Able to explain program exercise prescription;Long Term: Able to explain home exercise prescription to exercise independently

## 2023-03-31 NOTE — Progress Notes (Signed)
 Daily Session Note  Patient Details  Name: Natalie [name] MRN: [id number] Date of Birth: [birth year] Referring Provider:   Dor[facility] & Exercise from 03/31/2023 in [facility] Cardiac and Pulmonary Rehab  Referring Provider Owens Shark, MD       Encounter Date: 03/31/2023  Check In:  Session Check In - 03/31/23 1429       Check-In   Supervising physician immediately available to respond to emergencies See telemetry face sheet for immediately available ER MD    Location [facility]-Cardiac & Pulmonary Rehab    Staff Present Rory Percy, MS, Exercise Physiologist;Maxon Conetta BS, Exercise Physiologist    Virtual Visit No    Medication changes reported     No    Fall or balance concerns reported    No    Warm-up and Cool-down Performed on first and last piece of equipment   and   Resistance Training Performed Yes    VAD Patient? No    PAD/SET Patient? No      Pain Assessment   Currently in Pain? No/denies    Multiple Pain Sites No               Exercise Prescription Changes - 03/31/23 1400       Response to Exercise   Blood Pressure (Admit) 126/84    Blood Pressure (Exercise) 130/80    Blood Pressure (Exit) 114/78    Heart Rate (Admit) 75 bpm    Heart Rate (Exercise) 106 bpm    Heart Rate (Exit) 80 bpm    Oxygen Saturation (Admit) 92 %    Oxygen Saturation (Exercise) 97 %    Oxygen Saturation (Exit) 96 %    Rating of Perceived Exertion (Exercise) 12    Perceived Dyspnea (Exercise) 0    Symptoms none    Comments results    Duration Progress to 30 minutes of  aerobic without signs/symptoms of physical distress    Intensity THRR New      Progression   Progression Continue to progress workloads to maintain intensity without signs/symptoms of physical distress.    Average METs 3.01             Social History   Tobacco Use  Smoking Status Former   Current packs/day: 0.00   Average packs/day: 0.5 packs/day for 6.0  years (3.0 ttl pk-yrs)   Types: Cigarettes   Start date: 02/12/1968   Quit date: 02/11/1974   Years since quitting: 49.1  Smokeless Tobacco Never    Goals Met:  Independence with exercise equipment Exercise tolerated well No report of concerns or symptoms today  Goals Unmet:  Not Applicable  Comments: First day of exercise!  Patient was oriented to gym and equipment including functions, settings, policies, and procedures.  Patient's individual exercise prescription and treatment plan were reviewed.  All starting workloads were established based on the results of the 6 minute walk test done today. The plan for exercise progression was also introduced and progression will be customized based on patient's performance and goals. Results of the 6 minute walk test are listed below.   6 Minute Walk     Row Name 03/31/23 1431         6 Minute Walk   Phase Initial     Distance 1360 feet     Walk Time 6 minutes     # of Rest Breaks 0     MPH 2.58     METS 3.01  RPE 12     Perceived Dyspnea  0     VO2 Peak 10.54     Symptoms No     Resting HR 75 bpm     Resting BP 126/84     Resting Oxygen Saturation  92 %     Exercise Oxygen Saturation  during 6 min walk 97 %     Max Ex. HR 106 bpm     Max Ex. BP 130/80     2 Minute Post BP 114/78                Dr. Bethann Punches is Medical Director for [facility] Cardiac Rehabilitation.  Dr. Vida Rigger is Medical Director for [facility] Pulmonary Rehabilitation.

## 2023-04-02 DIAGNOSIS — C50919 Malignant neoplasm of unspecified site of unspecified female breast: Secondary | ICD-10-CM

## 2023-04-02 NOTE — Progress Notes (Signed)
 Daily Session Note  Patient Details  Name: Natalie [name] MRN: [id number] Date of Birth: 03/26/1950 Referring Provider:   Dor[facility]oid [facility] & Exercise from 03/31/2023 in [facility] Cardiac and Pulmonary Rehab  Referring Provider Owens Shark, MD       Encounter Date: 04/02/2023  Check In:  Session Check In - 04/02/23 1314       Check-In   Supervising physician immediately available to respond to emergencies See telemetry face sheet for immediately available ER MD    Location [facility]-Cardiac & Pulmonary Rehab    Staff Present Rory Percy, MS, Exercise Physiologist;Mariaeduarda Defranco, BS, Exercise Physiologist    Virtual Visit No    Fall or balance concerns reported    No    Warm-up and Cool-down Performed on first and last piece of equipment    Resistance Training Performed Yes    VAD Patient? No    PAD/SET Patient? No      Pain Assessment   Currently in Pain? No/denies    Multiple Pain Sites No                Social History   Tobacco Use  Smoking Status Former   Current packs/day: 0.00   Average packs/day: 0.5 packs/day for 6.0 years (3.0 ttl pk-yrs)   Types: Cigarettes   Start date: 02/12/1968   Quit date: 02/11/1974   Years since quitting: 49.1  Smokeless Tobacco Never    Goals Met:  Independence with exercise equipment Exercise tolerated well No report of concerns or symptoms today  Goals Unmet:  Not Applicable  Comments: First full day of exercise!  Patient was oriented to gym and equipment including functions, settings, policies, and procedures.  Patient's individual exercise prescription and treatment plan were reviewed.  All starting workloads were established based on the results of the 6 minute walk test done at initial orientation visit.  The plan for exercise progression was also introduced and progression will be customized based on patient's performance and goals.    Dr. Bethann Punches is Medical Director for [facility] Cardiac  Rehabilitation.  Dr. Vida Rigger is Medical Director for [facility] Pulmonary Rehabilitation.

## 2023-04-06 ENCOUNTER — Other Ambulatory Visit: Payer: Self-pay | Admitting: Nurse Practitioner

## 2023-04-09 ENCOUNTER — Ambulatory Visit

## 2023-04-09 DIAGNOSIS — Z0000 Encounter for general adult medical examination without abnormal findings: Secondary | ICD-10-CM

## 2023-04-09 NOTE — Progress Notes (Signed)
 Subjective:   Natalie [name] is a 73 y.o. who presents for a Medicare Wellness preventive visit.  Visit Complete: Virtual I connected with  [name] on 04/09/23 by a audio enabled telemedicine application and verified that I am speaking with the correct person using two identifiers.  Patient Location: Home  Provider Location: Office/Clinic  I discussed the limitations of evaluation and management by telemedicine. The patient expressed understanding and agreed to proceed.  Vital Signs: Because this visit was a virtual/telehealth visit, some criteria may be missing or patient reported. Any vitals not documented were not able to be obtained and vitals that have been documented are patient reported.  VideoDeclined- This patient declined Librarian, academic. Therefore the visit was completed with audio only.  AWV Questionnaire: No: Patient Medicare AWV questionnaire was not completed prior to this visit.  Cardiac Risk Factors include: advanced age (>45men, >4 women);dyslipidemia     Objective:    Today's Vitals   04/09/23 1351  PainSc: 0-No pain   There is no height or weight on file to calculate BMI.     04/09/2023    1:55 PM 01/20/2023    1:07 PM 12/29/2022   11:44 AM 09/25/2022    6:01 PM 07/21/2022    1:15 PM 07/02/2022   11:26 AM 06/26/2022   10:27 AM  Advanced Directives  Does Patient Have a Medical Advance Directive? Yes No Yes No Yes Yes Yes  Type of Estate agent of [place];Living will  Healthcare Power of [place];Living will  Healthcare Power of [place];Living will Healthcare Power of [place];Living will   Does patient want to make changes to medical advance directive? No - Patient declined No - Patient declined    No - Patient declined No - Patient declined  Copy of Healthcare Power of Attorney in Chart? Yes - validated most recent copy scanned in chart (See row information)     No - copy requested    Would patient like information on creating a medical advance directive?  No - Patient declined  No - Patient declined  No - Patient declined     Current Medications (verified) Outpatient Encounter Medications as of 04/09/2023  Medication Sig   alendronate (FOSAMAX) 70 MG tablet TAKE 1 TABLET(70 MG) BY MOUTH 1 TIME A WEEK WITH A FULL GLASS OF WATER AND ON AN EMPTY STOMACH   aspirin 81 MG EC tablet Take by mouth.   aspirin-acetaminophen-caffeine (EXCEDRIN MIGRAINE) 250-250-65 MG tablet Take by mouth every 6 (six) hours as needed for headache or migraine.   atorvastatin (LIPITOR) 40 MG tablet Take 1 tablet (40 mg total) by mouth daily.   buPROPion (WELLBUTRIN SR) 150 MG 12 hr tablet Take 1 tablet (150 mg total) by mouth 2 (two) times daily.   calcium carbonate (OSCAL) 1500 (600 Ca) MG TABS tablet Take 600 mg of elemental calcium by mouth 1 day or 1 dose.   cholecalciferol (VITAMIN D3) 25 MCG (1000 UNIT) tablet Take 1,000 Units by mouth daily.   citalopram (CELEXA) 40 MG tablet Take 1 tablet (40 mg total) by mouth daily.   Cyanocobalamin (VITAMIN B-12) 1000 MCG SUBL Place 1 tablet (1,000 mcg total) under the tongue 2 (two) times a week.   letrozole (FEMARA) 2.5 MG tablet TAKE 1 TABLET(2.5 MG) BY MOUTH DAILY   levothyroxine (SYNTHROID) 75 MCG tablet Take 1 tablet (75 mcg total) by mouth daily before breakfast.   Multiple Vitamin (MULTIVITAMIN WITH MINERALS) TABS tablet Take 1 tablet  by mouth daily.   Ubrogepant (UBRELVY) 100 MG TABS Take 100 mg by mouth daily as needed (migraines).   No facility-administered encounter medications on file as of 04/09/2023.    Allergies (verified) Sulfamethoxazole-trimethoprim and Tnkase [tenecteplase]   History: Past Medical History:  Diagnosis Date   Acute bronchospasm due to viral infection 2020   Due to pollen   Acute ischemic left MCA stroke (HCC) 04/19/2020   a.) CTA head/neck 04/19/2020 --> nearly occlusive thrombus at bifurcation of a proximal M2 MCA  branch approximately 6 mm from the MCA bifurcation. Subsequent occlusion of a proximal left M3 MCA branch   Allergy    Anemia    Angina at rest [facility])    Breast cancer, left (HCC) 08/21/2021   a.) stereotactic Bx 08/21/2021 --> IMC (G1, ER/PR +, Her2/neu -)   Cataract    a.) s/p extraction with IOL placement   Depressive disorder    Diverticulosis    Dyspnea    Epistaxis    Fatigue    Hearing loss    History of 2019 novel coronavirus disease (COVID-19) 12/18/2020   History of ischemic multifocal posterior circulation stroke 02/28/2020   a.) presented to ED with increasing episodes of  transient vertical binocular diplopia; MRI 02/27/2021 --> multiple punctate acute to subacute ischemic infarcts involving the cortical/subcortical aspects of both parieto-occipital regions   History of loop recorder    History of radiation therapy    Vagina-02/11/21-03/14/21- Dr. 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/18/2020   a.) stage 1A; grade 1 (pT1a, pN0); b.) s/p TLH/BSO 12/18/2020 + adjuvant vaginal brachytherapy (30 Gy over 5 fractions between 02/11/2021 - 03/14/2021)   Vitamin D deficiency    Past Surgical History:  Procedure Laterality Date   ABDOMINAL HYSTERECTOMY  12/18/2020   ABLATION  2006   AXILLARY SENTINEL NODE BIOPSY Left 09/25/2021   Procedure: AXILLARY SENTINEL NODE BIOPSY;  Surgeon: Earline Mayotte, MD;  Location: [facility] ORS;  Service: General;  Laterality: Left;   BREAST BIOPSY Left 08/21/2021   Stereo Bx, X-clip; path --> IMC (G1,  ER/PR +, Her2/neu -)   BREAST LUMPECTOMY Left 09/25/2021   BREAST LUMPECTOMY WITH NEEDLE LOCALIZATION Left 09/25/2021   Procedure: BREAST LUMPECTOMY WITH NEEDLE LOCALIZATION;  Surgeon: Earline Mayotte, MD;  Location: [facility] ORS;  Service: General;  Laterality: Left;   BUBBLE STUDY  04/23/2020   Procedure: BUBBLE STUDY;  Surgeon: Laurey Morale, MD;  Location: [facility] ENDOSCOPY;  Service: Cardiovascular;;   CATARACT EXTRACTION W/ INTRAOCULAR LENS IMPLANT Bilateral    COLONOSCOPY     COLONOSCOPY WITH PROPOFOL N/A 12/22/2018   Procedure: COLONOSCOPY WITH PROPOFOL;  Surgeon: Pasty Spillers, MD;  Location: [facility] ENDOSCOPY;  Service: Endoscopy;  Laterality: N/A;   EYE SURGERY  Spring 2018   Cataracts both eyes   HYSTEROSCOPY WITH D & C N/A 11/01/2020   Procedure: DILATATION AND CURETTAGE /HYSTEROSCOPY WITH MYOSURE;  Surgeon: Carver Fila, MD;  Location: [facility] ORS;  Service: Gynecology;  Laterality: N/A;   LAPAROTOMY  12/18/2020   Procedure: LAPAROTOMY;  Surgeon: Carver Fila, MD;  Location: [facility] ORS;  Service: Gynecology;;   LOOP RECORDER INSERTION N/A 04/23/2020   Procedure: LOOP RECORDER INSERTION;  Surgeon: Marinus Maw, MD;  Location: [facility] INVASIVE CV LAB;  Service: Cardiovascular;  Laterality: N/A;   ROBOTIC ASSISTED TOTAL HYSTERECTOMY WITH BILATERAL SALPINGO OOPHERECTOMY Bilateral 12/18/2020   Procedure: XI ROBOTIC ASSISTED TOTAL HYSTERECTOMY WITH BILATERAL SALPINGO OOPHORECTOMY;  Surgeon: Carver Fila, MD;  Location: [facility] ORS;  Service: Gynecology;  Laterality: Bilateral;   SENTINEL NODE BIOPSY N/A 12/18/2020   Procedure: SENTINEL NODE BIOPSY;  Surgeon: Carver Fila, MD;  Location: [facility] ORS;  Service: Gynecology;  Laterality: N/A;   TEE WITHOUT CARDIOVERSION N/A 04/23/2020   Procedure: TRANSESOPHAGEAL ECHOCARDIOGRAM (TEE);  Surgeon: Laurey Morale, MD;  Location[facility] ENDOSCOPY;  Service: Cardiovascular;  Laterality: N/A;   TONSILLECTOMY     Family History  Problem  Relation Age of Onset   Early death Father        suicide   Depression Father    Diabetes Father    Cancer Father        prostate   Hearing loss Father        due to war   Alzheimer's disease Mother    Heart disease Brother    Atrial fibrillation Brother    Heart disease Maternal Aunt    Dementia Maternal Aunt    Heart disease Maternal Uncle    Dementia Maternal Grandmother    Heart disease Brother    Atrial fibrillation Brother    Colon cancer Neg Hx    Breast cancer Neg Hx    Ovarian cancer Neg Hx    Pancreatic cancer Neg Hx    Endometrial cancer Neg Hx    Social History   Socioeconomic History   Marital status: Single    Spouse name: Not on file   Number of children: 1   Years of education: Not on file   Highest education level: Professional school degree (e.g., MD, DDS, DVM, JD)  Occupational History   Occupation: retired   Tobacco Use   Smoking status: Former    Current packs/day: 0.00    Average packs/day: 0.5 packs/day for 6.0 years (3.0 ttl pk-yrs)    Types: Cigarettes    Start date: 02/12/1968    Quit date: 02/11/1974    Years since quitting: 49.1   Smokeless tobacco: Never  Vaping Use   Vaping status: Never Used  Substance and Sexual Activity   Alcohol use: Yes    Comment: rare   Drug use: Never   Sexual activity: Not Currently    Comment: Don't use not sexually active  Other Topics Concern   Not on file  Social History Narrative   Raised an adopted child on her own   Working part time reviewed documents   Social Drivers of Corporate investment banker Strain: Low Risk  (04/09/2023)   Overall Financial Resource Strain (CARDIA)    Difficulty of Paying Living Expenses: Not hard at all  Food Insecurity: No Food Insecurity (04/09/2023)   Hunger Vital Sign    Worried About Running Out of Food in the Last Year: Never true    Ran Out of Food in the Last Year: Never true  Transportation Needs: No Transportation Needs (04/09/2023)   PRAPARE - Therapist, art (Medical): No    Lack of Transportation (Non-Medical):  No  Physical Activity: Sufficiently Active (04/09/2023)   Exercise Vital Sign    Days of Exercise per Week: 3 days    Minutes of Exercise per Session: 50 min  Stress: No Stress Concern Present (04/09/2023)   [employer] of Occupational Health - Occupational Stress Questionnaire    Feeling of Stress : Not at all  Social Connections: Moderately Integrated (04/09/2023)   Social Connection and Isolation Panel [NHANES]    Frequency of Communication with Friends and Family: More than three times a week    Frequency of Social Gatherings with Friends and Family: More than three times a week    Attends Religious Services: More than 4 times per year    Active Member of [employer] or Organizations: Yes    Attends Engineer, structural: More than 4 times per year    Marital Status: Never married    Tobacco Counseling Counseling given: Not Answered    Clinical Intake:  Pre-visit preparation completed: Yes  Pain : No/denies pain Pain Score: 0-No pain     BMI - recorded: 24.8 Nutritional Status: BMI of 19-24  Normal Nutritional Risks: None Diabetes: No  How often do you need to have someone help you when you read instructions, pamphlets, or other written materials from your doctor or pharmacy?: 1 - Never  Interpreter Needed?: No  Information entered by :: Kennedy Bucker, LPN   Activities of Daily Living     04/09/2023    1:59 PM 04/09/2023    8:48 AM  In your present state of health, do you have any difficulty performing the following activities:  Hearing? 0 0  Vision? 0 0  Difficulty concentrating or making decisions? 0 0  Walking or climbing stairs? 0 0  Dressing or bathing? 0 0  Doing errands, shopping? 0 0  Preparing Food and eating ? N N  Using the Toilet? N N  In the past six months, have you accidently leaked urine? N N  Do you have problems with loss of bowel control? N N  Managing your  Medications? N N  Managing your Finances? N N  Housekeeping or managing your Housekeeping? N N    Patient Care Team: Alba Cory, MD as PCP - General (Family Medicine) Antony Blackbird, MD as Consulting Physician (Radiation Oncology) Lonell Face, MD as Consulting Physician (Neurology) Alwyn Pea, MD as Consulting Physician (Cardiology) Carver Fila, MD as Consulting Physician (Gynecologic Oncology) Creig Hines, MD as Consulting Physician (Oncology)  Indicate any recent Medical Services you may have received from other than [facility] providers in the past year (date may be approximate).     Assessment:   This is a routine wellness examination for [place].  Hearing/Vision screen Hearing Screening - Comments:: NO AIDS Vision Screening - Comments:: READERS-    Goals Addressed             This Visit's Progress    DIET - EAT MORE FRUITS AND VEGETABLES         Depression Screen     04/09/2023    1:54 PM 09/12/2022   11:16 AM 04/15/2022    2:13 PM 04/03/2022    2:51 PM 03/14/2022    9:38 AM 12/11/2021   11:27 AM 09/10/2021   10:56 AM  PHQ 2/9 Scores  PHQ - 2 Score 0 0 1 1 1  0 0  PHQ- 9 Score 0 1 4 3 3  0 0    Fall Risk     04/09/2023  1:57 PM 04/09/2023    8:48 AM 04/05/2023    7:52 PM 09/12/2022   11:16 AM 04/15/2022    2:13 PM  Fall Risk   Falls in the past year? 1 0 0 1 0  Number falls in past yr: 0 0 0 0   Injury with Fall? 0 0 0 0   Risk for fall due to : History of fall(s)   No Fall Risks No Fall Risks  Follow up Falls prevention discussed;Falls evaluation completed   Falls prevention discussed Falls prevention discussed    MEDICARE RISK AT HOME:  Medicare Risk at Home Any stairs in or around the home?: Yes If so, are there any without handrails?: No Home free of loose throw rugs in walkways, pet beds, electrical cords, etc?: Yes Adequate lighting in your home to reduce risk of falls?: Yes Life alert?: No Use of a cane, walker or w/c?: No Grab  bars in the bathroom?: No Shower chair or bench in shower?: Yes Elevated toilet seat or a handicapped toilet?: Yes  TIMED UP AND GO:  Was the test performed?  No  Cognitive Function: 6CIT completed    06/18/2021   10:00 AM  MMSE - Mini Mental State Exam  Orientation to time 5  Orientation to Place 5  Registration 3  Attention/ Calculation 5  Recall 3  Language- name 2 objects 2  Language- repeat 1  Language- follow 3 step command 3  Language- read & follow direction 1  Write a sentence 1  Copy design 1  Total score 30        04/09/2023    2:00 PM 04/03/2022    3:01 PM 12/20/2019    2:30 PM 11/18/2018   11:10 AM  6CIT Screen  What Year? 0 points 0 points 0 points 0 points  What month? 0 points 0 points 0 points 0 points  What time? 0 points 0 points 0 points 0 points  Count back from 20 0 points 0 points 0 points 0 points  Months in reverse 0 points 0 points 0 points 0 points  Repeat phrase 0 points 0 points 0 points 0 points  Total Score 0 points 0 points 0 points 0 points    Immunizations Immunization History  Administered Date(s) Administered   Fluad Quad(high Dose 65+) 11/16/2018, 11/09/2019, 11/01/2021   Influenza, High Dose Seasonal PF 11/02/2015, 11/03/2016, 11/24/2017   Influenza,inj,Quad PF,6+ Mos 10/26/2014   Influenza-Unspecified 11/18/2022   Moderna Covid-19 Vaccine Bivalent Booster 65yrs & up 02/28/2021   Moderna SARS-COV2 Booster Vaccination 11/04/2021   PFIZER(Purple Top)SARS-COV-2 Vaccination 03/15/2019, 04/05/2019, 11/28/2019   Pneumococcal Conjugate-13 09/18/2016, 11/03/2016   Pneumococcal Polysaccharide-23 06/28/2015   RSV,unspecified 11/04/2021   Tdap 12/20/2012   Zoster Recombinant(Shingrix) 07/08/2017    Screening Tests Health Maintenance  Topic Date Due   Zoster Vaccines- Shingrix (2 of 2) 09/02/2017   Cervical Cancer Screening (Pap smear)  05/11/2021   COVID-19 Vaccine (5 - 2024-25 season) 10/05/2022   DTaP/Tdap/Td (2 - Td or Tdap)  12/21/2022   Colonoscopy  04/15/2023 (Originally 12/21/2021)   MAMMOGRAM  07/14/2023   Medicare Annual Wellness (AWV)  04/08/2024   Pneumonia Vaccine 59+ Years old  Completed   INFLUENZA VACCINE  Completed   DEXA SCAN  Completed   Hepatitis C Screening  Completed   HPV VACCINES  Aged Out    Health Maintenance  Health Maintenance Due  Topic Date Due   Zoster Vaccines- Shingrix (2 of 2) 09/02/2017   Cervical Cancer  Screening (Pap smear)  05/11/2021   COVID-19 Vaccine (5 - 2024-25 season) 10/05/2022   DTaP/Tdap/Td (2 - Td or Tdap) 12/21/2022   Health Maintenance Items Addressed: HAS COLONOSCOPY IN A WEEK MAMMOGRAM & BONE DENSITY SCAN SET FOR JUNE  Additional Screening:  Vision Screening: Recommended annual ophthalmology exams for early detection of glaucoma and other disorders of the eye.  Dental Screening: Recommended annual dental exams for proper oral hygiene  Community Resource Referral / Chronic Care Management: CRR required this visit?  No   CCM required this visit?  No     Plan:     I have personally reviewed and noted the following in the patient's chart:   Medical and social history Use of alcohol, tobacco or illicit drugs  Current medications and supplements including opioid prescriptions. Patient is not currently taking opioid prescriptions. Functional ability and status Nutritional status Physical activity Advanced directives List of other physicians Hospitalizations, surgeries, and ER visits in previous 12 months Vitals Screenings to include cognitive, depression, and falls Referrals and appointments  In addition, I have reviewed and discussed with patient certain preventive protocols, quality metrics, and best practice recommendations. A written personalized care plan for preventive services as well as general preventive health recommendations were provided to patient.     Hal Hope, LPN   07/12/6293   After Visit Summary: (MyChart) Due to  this being a telephonic visit, the after visit summary with patients personalized plan was offered to patient via MyChart   Notes: Nothing significant to report at this time.

## 2023-04-09 NOTE — Patient Instructions (Addendum)
 Natalie [name] , Thank you for taking time to come for your Medicare Wellness Visit. I appreciate your ongoing commitment to your health goals. Please review the following plan we discussed and let me know if I can assist you in the future.   Referrals/Orders/Follow-Ups/Clinician Recommendations: NONE  This is a list of the screening recommended for you and due dates:  Health Maintenance  Topic Date Due   Zoster (Shingles) Vaccine (2 of 2) 09/02/2017   Pap Smear  05/11/2021   COVID-19 Vaccine (5 - 2024-25 season) 10/05/2022   DTaP/Tdap/Td vaccine (2 - Td or Tdap) 12/21/2022   Colon Cancer Screening  04/15/2023*   Mammogram  07/14/2023   Medicare Annual Wellness Visit  04/08/2024   Pneumonia Vaccine  Completed   Flu Shot  Completed   DEXA scan (bone density measurement)  Completed   Hepatitis C Screening  Completed   HPV Vaccine  Aged Out  *Topic was postponed. The date shown is not the original due date.    Advanced directives: (In Chart) A copy of your advanced directives are scanned into your chart should your provider ever need it.  Next Medicare Annual Wellness Visit scheduled for next year: Yes  04/14/24 @ 1:10 PM BY PHONE

## 2023-04-13 ENCOUNTER — Ambulatory Visit (INDEPENDENT_AMBULATORY_CARE_PROVIDER_SITE_OTHER): Payer: Medicare Other | Admitting: Family Medicine

## 2023-04-13 ENCOUNTER — Encounter: Payer: Self-pay | Admitting: Family Medicine

## 2023-04-13 VITALS — BP 124/76 | HR 83 | Resp 16 | Ht 66.1 in | Wt 150.2 lb

## 2023-04-13 DIAGNOSIS — Z170 Estrogen receptor positive status [ER+]: Secondary | ICD-10-CM

## 2023-04-13 DIAGNOSIS — Z8542 Personal history of malignant neoplasm of other parts of uterus: Secondary | ICD-10-CM

## 2023-04-13 DIAGNOSIS — E559 Vitamin D deficiency, unspecified: Secondary | ICD-10-CM | POA: Diagnosis not present

## 2023-04-13 DIAGNOSIS — C50912 Malignant neoplasm of unspecified site of left female breast: Secondary | ICD-10-CM | POA: Diagnosis not present

## 2023-04-13 DIAGNOSIS — F339 Major depressive disorder, recurrent, unspecified: Secondary | ICD-10-CM

## 2023-04-13 DIAGNOSIS — E785 Hyperlipidemia, unspecified: Secondary | ICD-10-CM

## 2023-04-13 DIAGNOSIS — R002 Palpitations: Secondary | ICD-10-CM | POA: Diagnosis not present

## 2023-04-13 DIAGNOSIS — Z8673 Personal history of transient ischemic attack (TIA), and cerebral infarction without residual deficits: Secondary | ICD-10-CM | POA: Diagnosis not present

## 2023-04-13 DIAGNOSIS — I639 Cerebral infarction, unspecified: Secondary | ICD-10-CM | POA: Diagnosis not present

## 2023-04-13 DIAGNOSIS — Z79899 Other long term (current) drug therapy: Secondary | ICD-10-CM

## 2023-04-13 DIAGNOSIS — M720 Palmar fascial fibromatosis [Dupuytren]: Secondary | ICD-10-CM

## 2023-04-13 DIAGNOSIS — G4733 Obstructive sleep apnea (adult) (pediatric): Secondary | ICD-10-CM | POA: Diagnosis not present

## 2023-04-13 DIAGNOSIS — E538 Deficiency of other specified B group vitamins: Secondary | ICD-10-CM

## 2023-04-13 DIAGNOSIS — R55 Syncope and collapse: Secondary | ICD-10-CM | POA: Diagnosis not present

## 2023-04-13 DIAGNOSIS — E039 Hypothyroidism, unspecified: Secondary | ICD-10-CM | POA: Diagnosis not present

## 2023-04-13 DIAGNOSIS — E782 Mixed hyperlipidemia: Secondary | ICD-10-CM | POA: Diagnosis not present

## 2023-04-13 DIAGNOSIS — M810 Age-related osteoporosis without current pathological fracture: Secondary | ICD-10-CM | POA: Diagnosis not present

## 2023-04-13 DIAGNOSIS — R270 Ataxia, unspecified: Secondary | ICD-10-CM | POA: Diagnosis not present

## 2023-04-13 DIAGNOSIS — I10 Essential (primary) hypertension: Secondary | ICD-10-CM | POA: Diagnosis not present

## 2023-04-13 DIAGNOSIS — R5382 Chronic fatigue, unspecified: Secondary | ICD-10-CM | POA: Diagnosis not present

## 2023-04-13 NOTE — Addendum Note (Signed)
 Addended by: Alba Cory F on: 04/13/2023 12:53 PM   Modules accepted: Level of Service

## 2023-04-13 NOTE — Progress Notes (Signed)
 Name: Natalie [name]   MRN: [id number]    DOB: May 08, 1950   Date:04/13/2023       Progress Note  Subjective  Chief Complaint  Chief Complaint  Patient presents with   Medical Management of Chronic Issues   HPI   Recurrent strokes: first one TIA 01/22, stroke 04/2020 and another one 12/2021. She is on statin and only on aspirin, off Plavix now. She is  under the care of neurologist, Dr. Sherryll Burger. She has OSA but does not want to use CPAP, she saw dentist and is wearing a mouth piece.    Mild Cognitive Dysfunction: difficulty with word finding. She is seeing Human resources officer , she was also referred to a neuropsychologist She states memory has improved.    MDD: she  was under Dr. Verdie Mosher care for many years but he is retiring and she asked me to prescribe her medications from now on. She takes medications as prescribed citalopram and wellbutrin . She is doing well , phq 9 is negative . She has been very involved at church, always out of the house and likes being busy    B12 deficiency/history of vitamin D deficiency: reviewed last labs, she is only taking otc supplements now . We will recheck labs    Hypothyroidism: she has been taking medication as prescribed, she states hair  loss resolved , she has intermittent  dry skin, no constipation . Last TSH was at goal. She has been feeling tired lately   HPV positive/High CA 125/ovarian mass diagnosed 2022  : she alternates visit with  Dr. Bertis Ruddy and after that   Dr. Eugene Garnet , treated for endometrium cancer and is now in remission . She was seen recently    History of Breast cancer left : lumpectomy done 09/2021 , s/p radiation but no chemotherapy, seeing Dr. Aggie Cosier every year She also follows up with surgeon, unable to take Tamoxifen due to strokes , she is on Femara and denies side effects . She is up to date with mammogram and will have repeat it this Summer   Osteoporosis: taking Alendronate , taking calcium and vitamin D , she denies  side effects of medications   Dupuytren's contractures: she has seen Dr. Stephenie Acres in the past and is noticing worsening of symptoms right hand, also trigger finger of right 4th finger right side. She had some steroid injections and is doing well since.     Patient Active Problem List   Diagnosis Date Noted   TIA on medication 09/12/2022   B12 deficiency 09/12/2022   History of malignant neoplasm of endometrium 09/12/2022   Trigger finger, right ring finger 09/10/2021   Dupuytren's contracture of both hands 09/10/2021   Vitamin D deficiency 09/10/2021   Malignant neoplasm of left breast in female, estrogen receptor positive (HCC) 09/01/2021   Dyslipidemia 05/16/2021   Major depression, recurrent, chronic (HCC) 03/13/2021   Endometrial cancer (HCC) 12/26/2020   Postoperative anemia due to acute blood loss 12/19/2020   History of CVA (cerebrovascular accident) 04/19/2020   History of ischemic multifocal posterior circulation stroke 02/29/2020   Difficulty hearing 06/28/2015   Arthritis, degenerative 06/28/2015   Purpura, nonthrombopenic (HCC) 06/28/2015   Adult hypothyroidism 08/09/2014   Migraine without aura and without status migrainosus, not intractable 09/19/2008   OP (osteoporosis) 09/07/2006    Past Surgical History:  Procedure Laterality Date   ABDOMINAL HYSTERECTOMY  12/18/2020   ABLATION  2006   AXILLARY SENTINEL NODE BIOPSY Left 09/25/2021   Procedure: AXILLARY SENTINEL  NODE BIOPSY;  Surgeon: Earline Mayotte, MD;  Location: [facility] ORS;  Service: General;  Laterality: Left;   BREAST BIOPSY Left 08/21/2021   Stereo Bx, X-clip; path --> IMC (G1, ER/PR +, Her2/neu -)   BREAST LUMPECTOMY Left 09/25/2021   BREAST LUMPECTOMY WITH NEEDLE LOCALIZATION Left 09/25/2021   Procedure: BREAST LUMPECTOMY WITH NEEDLE LOCALIZATION;  Surgeon: Earline Mayotte, MD;  Location: [facility] ORS;  Service: General;  Laterality: Left;   BUBBLE STUDY  04/23/2020   Procedure: BUBBLE STUDY;  Surgeon:  Laurey Morale, MD;  Location: [facility] ENDOSCOPY;  Service: Cardiovascular;;   CATARACT EXTRACTION W/ INTRAOCULAR LENS IMPLANT Bilateral    COLONOSCOPY     COLONOSCOPY WITH PROPOFOL N/A 12/22/2018   Procedure: COLONOSCOPY WITH PROPOFOL;  Surgeon: Pasty Spillers, MD;  Location: [facility] ENDOSCOPY;  Service: Endoscopy;  Laterality: N/A;   EYE SURGERY  Spring 2018   Cataracts both eyes   HYSTEROSCOPY WITH D & C N/A 11/01/2020   Procedure: DILATATION AND CURETTAGE /HYSTEROSCOPY WITH MYOSURE;  Surgeon: Carver Fila, MD;  Location: [facility] ORS;  Service: Gynecology;  Laterality: N/A;   LAPAROTOMY  12/18/2020   Procedure: LAPAROTOMY;  Surgeon: Carver Fila, MD;  Location: [facility] ORS;  Service: Gynecology;;   LOOP RECORDER INSERTION N/A 04/23/2020   Procedure: LOOP RECORDER INSERTION;  Surgeon: Marinus Maw, MD;  Location: [facility] INVASIVE CV LAB;  Service: Cardiovascular;  Laterality: N/A;   ROBOTIC ASSISTED TOTAL HYSTERECTOMY WITH BILATERAL SALPINGO OOPHERECTOMY Bilateral 12/18/2020   Procedure: XI ROBOTIC ASSISTED TOTAL HYSTERECTOMY WITH BILATERAL SALPINGO OOPHORECTOMY;  Surgeon: Carver Fila, MD;  Location: [facility] ORS;  Service: Gynecology;  Laterality: Bilateral;   SENTINEL NODE BIOPSY N/A 12/18/2020   Procedure: SENTINEL NODE BIOPSY;  Surgeon: Carver Fila, MD;  Location: [facility] ORS;  Service: Gynecology;  Laterality: N/A;   TEE WITHOUT CARDIOVERSION N/A 04/23/2020   Procedure: TRANSESOPHAGEAL ECHOCARDIOGRAM (TEE);  Surgeon: Laurey Morale, MD;  Location: [facility] ENDOSCOPY;  Service: Cardiovascular;  Laterality: N/A;   TONSILLECTOMY      Family History  Problem Relation Age of Onset   Early death Father        suicide   Depression Father    Diabetes Father    Cancer Father        prostate   Hearing loss Father        due to war   Alzheimer's disease Mother    Heart disease Brother    Atrial fibrillation Brother    Heart disease Maternal Aunt    Dementia Maternal Aunt    Heart  disease Maternal Uncle    Dementia Maternal Grandmother    Heart disease Brother    Atrial fibrillation Brother    Colon cancer Neg Hx    Breast cancer Neg Hx    Ovarian cancer Neg Hx    Pancreatic cancer Neg Hx    Endometrial cancer Neg Hx     Social History   Tobacco Use   Smoking status: Former    Current packs/day: 0.00    Average packs/day: 0.5 packs/day for 6.0 years (3.0 ttl pk-yrs)    Types: Cigarettes    Start date: 02/12/1968    Quit date: 02/11/1974    Years since quitting: 49.2   Smokeless tobacco: Never  Substance Use Topics   Alcohol use: Yes    Comment: rare     Current Outpatient Medications:    alendronate (FOSAMAX) 70 MG tablet, TAKE 1 TABLET(70 MG) BY MOUTH 1 TIME A  WEEK WITH A FULL GLASS OF WATER AND ON AN EMPTY STOMACH, Disp: 12 tablet, Rfl: 0   aspirin 81 MG EC tablet, Take by mouth., Disp: , Rfl:    aspirin-acetaminophen-caffeine (EXCEDRIN MIGRAINE) 250-250-65 MG tablet, Take by mouth every 6 (six) hours as needed for headache or migraine., Disp: , Rfl:    atorvastatin (LIPITOR) 40 MG tablet, Take 1 tablet (40 mg total) by mouth daily., Disp: 90 tablet, Rfl: 2   buPROPion (WELLBUTRIN SR) 150 MG 12 hr tablet, Take 1 tablet (150 mg total) by mouth 2 (two) times daily., Disp: 180 tablet, Rfl: 2   calcium carbonate (OSCAL) 1500 (600 Ca) MG TABS tablet, Take 600 mg of elemental calcium by mouth 1 day or 1 dose., Disp: , Rfl:    cholecalciferol (VITAMIN D3) 25 MCG (1000 UNIT) tablet, Take 1,000 Units by mouth daily., Disp: , Rfl:    citalopram (CELEXA) 40 MG tablet, Take 1 tablet (40 mg total) by mouth daily., Disp: 90 tablet, Rfl: 2   Cyanocobalamin (VITAMIN B-12) 1000 MCG SUBL, Place 1 tablet (1,000 mcg total) under the tongue 2 (two) times a week., Disp: 30 tablet, Rfl: 0   letrozole (FEMARA) 2.5 MG tablet, TAKE 1 TABLET(2.5 MG) BY MOUTH DAILY, Disp: 90 tablet, Rfl: 1   levothyroxine (SYNTHROID) 75 MCG tablet, Take 1 tablet (75 mcg total) by mouth daily before  breakfast., Disp: 90 tablet, Rfl: 2   Multiple Vitamin (MULTIVITAMIN WITH MINERALS) TABS tablet, Take 1 tablet by mouth daily., Disp: , Rfl:    Ubrogepant (UBRELVY) 100 MG TABS, Take 100 mg by mouth daily as needed (migraines)., Disp: , Rfl:   Allergies  Allergen Reactions   Sulfamethoxazole-Trimethoprim Hives   Tnkase [Tenecteplase] Swelling    Mild focal left upper lip swelling    I personally reviewed active problem list, medication list, allergies, family history with the patient/caregiver today.   ROS  Ten systems reviewed and is negative except as mentioned in HPI    Objective  Vitals:   04/13/23 1043  BP: 124/76  Pulse: 83  Resp: 16  SpO2: 97%  Weight: 150 lb 3.2 oz (68.1 kg)  Height: 5' 6.1" (1.679 m)    Body mass index is 24.17 kg/m.  Physical Exam  Constitutional: Patient appears well-developed and well-nourished.  No distress.  HEENT: head atraumatic, normocephalic, pupils equal and reactive to light, neck supple Cardiovascular: Normal rate, regular rhythm and normal heart sounds.  No murmur heard. No BLE edema. Pulmonary/Chest: Effort normal and breath sounds normal. No respiratory distress. Abdominal: Soft.  There is no tenderness. Psychiatric: Patient has a normal mood and affect. behavior is normal. Judgment and thought content normal.    Diabetic Foot Exam:     PHQ2/9:    04/09/2023    1:54 PM 09/12/2022   11:16 AM 04/15/2022    2:13 PM 04/03/2022    2:51 PM 03/14/2022    9:38 AM  Depression screen PHQ 2/9  Decreased Interest 0 0 0 0 0  Down, Depressed, Hopeless 0 0 1 1 1   PHQ - 2 Score 0 0 1 1 1   Altered sleeping 0 0 1 0 0  Tired, decreased energy 0 1 1 1 1   Change in appetite 0 0 1 1 1   Feeling bad or failure about yourself  0 0 0 0 0  Trouble concentrating 0 0 0 0 0  Moving slowly or fidgety/restless 0 0 0 0 0  Suicidal thoughts 0 0 0 0 0  PHQ-9  Score 0 1 4 3 3   Difficult doing work/chores Not difficult at all  Not difficult at all  Somewhat difficult Not difficult at all    phq 9 is negative  Fall Risk:    04/09/2023    1:57 PM 04/09/2023    8:48 AM 04/05/2023    7:52 PM 09/12/2022   11:16 AM 04/15/2022    2:13 PM  Fall Risk   Falls in the past year? 1 0 0 1 0  Number falls in past yr: 0 0 0 0   Injury with Fall? 0 0 0 0   Risk for fall due to : History of fall(s)   No Fall Risks No Fall Risks  Follow up Falls prevention discussed;Falls evaluation completed   Falls prevention discussed Falls prevention discussed     Assessment & Plan  1. Major depression, recurrent, chronic (HCC) (Primary)  Doing well on medication  2. Malignant neoplasm of left breast in female, estrogen receptor positive, unspecified site of breast [facility])  Keep visits with Dr. Smith Robert  3. History of CVA in adulthood  Doing well, taking medications and up to date with visits with cardiologist and neurologist   4. B12 deficiency  - CBC with Differential/Platelet - B12 and Folate Panel  5. Vitamin D deficiency  - VITAMIN D 25 Hydroxy (Vit-D Deficiency, Fractures)  6. Dyslipidemia  - Lipid panel  7. Age-related osteoporosis without current pathological fracture  On Alendronate  8. Adult hypothyroidism  - TSH  9. History of malignant neoplasm of endometrium  Up to date with oncologist   10. Dupuytren's contracture of both hands  Improved since injections  11. Long-term use of high-risk medication  - COMPLETE METABOLIC PANEL WITH GFR

## 2023-04-14 ENCOUNTER — Encounter: Payer: Self-pay | Admitting: Family Medicine

## 2023-04-14 ENCOUNTER — Other Ambulatory Visit: Payer: Self-pay | Admitting: Family Medicine

## 2023-04-14 ENCOUNTER — Encounter: Payer: Medicare Other | Attending: Oncology | Admitting: *Deleted

## 2023-04-14 DIAGNOSIS — D708 Other neutropenia: Secondary | ICD-10-CM

## 2023-04-14 DIAGNOSIS — D696 Thrombocytopenia, unspecified: Secondary | ICD-10-CM

## 2023-04-14 DIAGNOSIS — C50919 Malignant neoplasm of unspecified site of unspecified female breast: Secondary | ICD-10-CM | POA: Insufficient documentation

## 2023-04-14 LAB — CBC WITH DIFFERENTIAL/PLATELET
Absolute Lymphocytes: 823 {cells}/uL — ABNORMAL LOW (ref 850–3900)
Absolute Monocytes: 179 {cells}/uL — ABNORMAL LOW (ref 200–950)
Basophils Absolute: 21 {cells}/uL (ref 0–200)
Basophils Relative: 0.6 %
Eosinophils Absolute: 39 {cells}/uL (ref 15–500)
Eosinophils Relative: 1.1 %
HCT: 41.2 % (ref 35.0–45.0)
Hemoglobin: 13.6 g/dL (ref 11.7–15.5)
MCH: 31.9 pg (ref 27.0–33.0)
MCHC: 33 g/dL (ref 32.0–36.0)
MCV: 96.5 fL (ref 80.0–100.0)
MPV: 10.7 fL (ref 7.5–12.5)
Monocytes Relative: 5.1 %
Neutro Abs: 2440 {cells}/uL (ref 1500–7800)
Neutrophils Relative %: 69.7 %
Platelets: 139 10*3/uL — ABNORMAL LOW (ref 140–400)
RBC: 4.27 10*6/uL (ref 3.80–5.10)
RDW: 11.3 % (ref 11.0–15.0)
Total Lymphocyte: 23.5 %
WBC: 3.5 10*3/uL — ABNORMAL LOW (ref 3.8–10.8)

## 2023-04-14 LAB — COMPLETE METABOLIC PANEL WITH GFR
AG Ratio: 1.9 (calc) (ref 1.0–2.5)
ALT: 19 U/L (ref 6–29)
AST: 22 U/L (ref 10–35)
Albumin: 4.5 g/dL (ref 3.6–5.1)
Alkaline phosphatase (APISO): 62 U/L (ref 37–153)
BUN: 16 mg/dL (ref 7–25)
CO2: 30 mmol/L (ref 20–32)
Calcium: 9.5 mg/dL (ref 8.6–10.4)
Chloride: 100 mmol/L (ref 98–110)
Creat: 0.69 mg/dL (ref 0.60–1.00)
Globulin: 2.4 g/dL (ref 1.9–3.7)
Glucose, Bld: 87 mg/dL (ref 65–99)
Potassium: 4.1 mmol/L (ref 3.5–5.3)
Sodium: 137 mmol/L (ref 135–146)
Total Bilirubin: 0.8 mg/dL (ref 0.2–1.2)
Total Protein: 6.9 g/dL (ref 6.1–8.1)
eGFR: 92 mL/min/{1.73_m2} (ref >=60)

## 2023-04-14 LAB — B12 AND FOLATE PANEL
Folate: >24 ng/mL
Vitamin B-12: 1115 pg/mL — ABNORMAL HIGH (ref 200–1100)

## 2023-04-14 LAB — LIPID PANEL
Cholesterol: 100 mg/dL (ref <200)
HDL: 38 mg/dL — ABNORMAL LOW (ref >=50)
LDL Cholesterol (Calc): 49 mg/dL
Non-HDL Cholesterol (Calc): 62 mg/dL (ref <130)
Total CHOL/HDL Ratio: 2.6 (calc) (ref <5.0)
Triglycerides: 46 mg/dL (ref <150)

## 2023-04-14 LAB — TSH: TSH: 1.37 m[IU]/L (ref 0.40–4.50)

## 2023-04-14 LAB — VITAMIN D 25 HYDROXY (VIT D DEFICIENCY, FRACTURES): Vit D, 25-Hydroxy: 76 ng/mL (ref 30–100)

## 2023-04-14 NOTE — Progress Notes (Signed)
 Daily Session Note  Patient Details  Name: Natalie [name] MRN: [id number] Date of Birth: [birth year] Referring Provider:   Doristine Devoid [facility] from 03/31/2023 in [facility] Cardiac [facility] Pul[facility] Rehab  Referring Provider Owens Shark, MD       Encounter Date: 04/14/2023  Check In:  Session Check In - 04/14/23 1240       Check-In   Supervising physician immediately available to respond to emergencies See telemetry face sheet for immediately available ER MD    Location [facility]-Cardiac & Pulmonary Rehab    Staff Present Susann Givens RN,BSN;Margaret Best, MS, Exercise Physiologist    Virtual Visit No    Medication changes reported     No    Fall or balance concerns reported    No    Warm-up and Cool-down Performed on first and last piece of equipment    Resistance Training Performed Yes    VAD Patient? No    PAD/SET Patient? No      Pain Assessment   Currently in Pain? No/denies                Social History   Tobacco Use  Smoking Status Former   Current packs/day: 0.00   Average packs/day: 0.5 packs/day for 6.0 years (3.0 ttl pk-yrs)   Types: Cigarettes   Start date: 02/12/1968   Quit date: 02/11/1974   Years since quitting: 49.2  Smokeless Tobacco Never    Goals Met:  Independence with exercise equipment Exercise tolerated well No report of concerns or symptoms today Strength training completed today  Goals Unmet:  Not Applicable  Comments: Pt able to follow exercise prescription today without complaint.  Will continue to monitor for progression.    Dr. Bethann Punches is Medical Director for [facility] Cardiac Rehabilitation.  Dr. Vida Rigger is Medical Director for [facility] Pulmonary Rehabilitation.

## 2023-04-16 ENCOUNTER — Ambulatory Visit: Admitting: Registered Nurse

## 2023-04-16 ENCOUNTER — Encounter: Payer: Self-pay | Admitting: Gastroenterology

## 2023-04-16 ENCOUNTER — Encounter
Admission: RE | Disposition: A | Discharge status: Home / Self Care | Payer: Self-pay | Source: Home / Self Care | Attending: Gastroenterology

## 2023-04-16 ENCOUNTER — Ambulatory Visit
Admission: RE | Admit: 2023-04-16 | Discharge: 2023-04-16 | Disposition: A | Discharge status: Home / Self Care | Payer: Medicare Other | Attending: Gastroenterology | Admitting: Gastroenterology

## 2023-04-16 DIAGNOSIS — Z1211 Encounter for screening for malignant neoplasm of colon: Secondary | ICD-10-CM | POA: Insufficient documentation

## 2023-04-16 DIAGNOSIS — Z860100 Personal history of colon polyps, unspecified: Secondary | ICD-10-CM

## 2023-04-16 DIAGNOSIS — K5730 Diverticulosis of large intestine without perforation or abscess without bleeding: Secondary | ICD-10-CM | POA: Insufficient documentation

## 2023-04-16 DIAGNOSIS — Z860101 Personal history of adenomatous and serrated colon polyps: Secondary | ICD-10-CM | POA: Diagnosis not present

## 2023-04-16 SURGERY — COLONOSCOPY WITH PROPOFOL
Anesthesia: General

## 2023-04-16 MED ORDER — PROPOFOL 10 MG/ML IV BOLUS
INTRAVENOUS | Status: AC
  Filled 2023-04-16: qty 20

## 2023-04-16 MED ORDER — LIDOCAINE HCL (PF) 2 % IJ SOLN
INTRAMUSCULAR | Status: AC
  Filled 2023-04-16: qty 5

## 2023-04-16 MED ORDER — PROPOFOL 10 MG/ML IV BOLUS
INTRAVENOUS | Status: DC | PRN
  Administered 2023-04-16: 20 mg via INTRAVENOUS
  Administered 2023-04-16: 50 mg via INTRAVENOUS

## 2023-04-16 MED ORDER — LIDOCAINE HCL (PF) 2 % IJ SOLN
INTRAMUSCULAR | Status: DC | PRN
  Administered 2023-04-16: 40 mg via INTRADERMAL

## 2023-04-16 MED ORDER — PROPOFOL 1000 MG/100ML IV EMUL
INTRAVENOUS | Status: AC
  Filled 2023-04-16: qty 100

## 2023-04-16 MED ORDER — SODIUM CHLORIDE 0.9% FLUSH: 3.0000 mL | INTRAVENOUS | Status: DC | PRN

## 2023-04-16 MED ORDER — PROPOFOL 500 MG/50ML IV EMUL
INTRAVENOUS | Status: DC | PRN
  Administered 2023-04-16: 100 ug/kg/min via INTRAVENOUS

## 2023-04-16 MED ORDER — SODIUM CHLORIDE 0.9% FLUSH
3.0000 mL | Freq: Two times a day (BID) | INTRAVENOUS | Status: DC
  Administered 2023-04-16: 500 mL via INTRAVENOUS

## 2023-04-16 MED ORDER — SODIUM CHLORIDE 0.9 % IV SOLN: INTRAVENOUS | Status: DC | PRN

## 2023-04-16 NOTE — Op Note (Signed)
 [facility] Gastroenterology Patient Name: Natalie [name] Procedure Date: 04/16/2023 8:27 AM MRN: [id number] Account #: [id number] Date of Birth: Jul 22, 1950 Admit Type: Outpatient Age: 73 Room: [facility] ENDO ROOM 3 Gender: Female Note Status: Finalized Instrument Name: Prentice Docker [id number] Procedure:             Colonoscopy Indications:           Surveillance: Personal history of adenomatous polyps                         on last colonoscopy 5 years ago, Last colonoscopy:                         November 2020 Providers:             Toney Reil MD, MD Referring MD:          Onnie Boer. Sowles, MD (Referring MD) Medicines:             General Anesthesia Complications:         No immediate complications. Estimated blood loss: None. Procedure:             Pre-Anesthesia Assessment:                        - Prior to the procedure, a History and Physical was                         performed, and patient medications and allergies were                         reviewed. The patient is competent. The risks and                         benefits of the procedure and the sedation options and                         risks were discussed with the patient. All questions                         were answered and informed consent was obtained.                         Patient identification and proposed procedure were                         verified by the physician, the nurse, the                         anesthesiologist, the anesthetist and the technician                         in the pre-procedure area in the procedure room in the                         endoscopy suite. Mental Status Examination: alert and                         oriented. Airway Examination: normal oropharyngeal  airway and neck mobility. Respiratory Examination:                         clear to auscultation. CV Examination: normal.                         Prophylactic Antibiotics:  The patient does not require                         prophylactic antibiotics. Prior Anticoagulants: The                         patient has taken no anticoagulant or antiplatelet                         agents. ASA Grade Assessment: II - A patient with mild                         systemic disease. After reviewing the risks and                         benefits, the patient was deemed in satisfactory                         condition to undergo the procedure. The anesthesia                         plan was to use general anesthesia. Immediately prior                         to administration of medications, the patient was                         re-assessed for adequacy to receive sedatives. The                         heart rate, respiratory rate, oxygen saturations,                         blood pressure, adequacy of pulmonary ventilation, and                         response to care were monitored throughout the                         procedure. The physical status of the patient was                         re-assessed after the procedure.                        After obtaining informed consent, the colonoscope was                         passed under direct vision. Throughout the procedure,                         the patient's blood pressure, pulse, and oxygen  saturations were monitored continuously. The                         Colonoscope was introduced through the anus and                         advanced to the the cecum, identified by appendiceal                         orifice and ileocecal valve. The colonoscopy was                         performed with moderate difficulty due to multiple                         diverticula in the colon and a redundant colon.                         Successful completion of the procedure was aided by                         applying abdominal pressure. The patient tolerated the                         procedure well.  The quality of the bowel preparation                         was adequate to identify polyps greater than 5 mm in                         size. The ileocecal valve, appendiceal orifice, and                         rectum were photographed. Findings:      The perianal and digital rectal examinations were normal. Pertinent       negatives include normal sphincter tone and no palpable rectal lesions.      Multiple diverticula were found in the recto-sigmoid colon and sigmoid       colon.      The retroflexed view of the distal rectum and anal verge was normal and       showed no anal or rectal abnormalities. Impression:            - Diverticulosis in the recto-sigmoid colon and in the                         sigmoid colon.                        - The distal rectum and anal verge are normal on                         retroflexion view.                        - No specimens collected. Recommendation:        - Discharge patient to home (with escort).                        -  Resume previous diet today.                        - Continue present medications.                        - Repeat colonoscopy in 5 years for surveillance. Procedure Code(s):     --- Professional ---                        M5784, Colorectal cancer screening; colonoscopy on                         individual at high risk Diagnosis Code(s):     --- Professional ---                        Z86.010, Personal history of colonic polyps                        K57.30, Diverticulosis of large intestine without                         perforation or abscess without bleeding CPT copyright 2022 American Medical Association. All rights reserved. The codes documented in this report are preliminary and upon coder review may  be revised to meet current compliance requirements. Dr. Libby Maw Toney Reil MD, MD 04/16/2023 9:02:21 AM This report has been signed electronically. Number of Addenda: 0 Note Initiated On: 04/16/2023  8:27 AM Scope Withdrawal Time: 0 hours 10 minutes 17 seconds  Total Procedure Duration: 0 hours 19 minutes 43 seconds  Estimated Blood Loss:  Estimated blood loss: none.      [facility]

## 2023-04-16 NOTE — Transfer of Care (Signed)
 Immediate Anesthesia Transfer of Care Note  Patient: [name]  Procedure(s) Performed: COLONOSCOPY WITH PROPOFOL  Patient Location: PACU  Anesthesia Type:General  Level of Consciousness: drowsy and patient cooperative  Airway & Oxygen Therapy: Patient Spontanous Breathing  Post-op Assessment: Report given to RN and Post -op Vital signs reviewed and stable  Post vital signs: stable  Last Vitals:  Vitals Value Taken Time  BP 95/56 04/16/23 0906  Temp    Pulse 57 04/16/23 0906  Resp 16 04/16/23 0906  SpO2 100 % 04/16/23 0906  Vitals shown include unfiled device data.  Last Pain:  Vitals:   04/16/23 0738  TempSrc: Temporal  PainSc: 0-No pain         Complications: No notable events documented.

## 2023-04-16 NOTE — Anesthesia Postprocedure Evaluation (Signed)
 Anesthesia Post Note  Patient: Natalie [name]  Procedure(s) Performed: COLONOSCOPY WITH PROPOFOL  Patient location during evaluation: Endoscopy Anesthesia Type: General Level of consciousness: awake and alert Pain management: pain level controlled Vital Signs Assessment: post-procedure vital signs reviewed and stable Respiratory status: spontaneous breathing, nonlabored ventilation, respiratory function stable and patient connected to nasal cannula oxygen Cardiovascular status: blood pressure returned to baseline and stable Postop Assessment: no apparent nausea or vomiting Anesthetic complications: no   No notable events documented.   Last Vitals:  Vitals:   04/16/23 0916 04/16/23 0926  BP: 121/65 (!) 140/75  Pulse:    Resp: 18 16  Temp:    SpO2: 100% 99%    Last Pain:  Vitals:   04/16/23 0926  TempSrc:   PainSc: 0-No pain                 Lenard Simmer

## 2023-04-16 NOTE — Anesthesia Preprocedure Evaluation (Signed)
 Anesthesia Evaluation  Patient identified by MRN, date of birth, ID band Patient awake    Reviewed: Allergy & Precautions, NPO status , Patient's Chart, lab work & pertinent test results  History of Anesthesia Complications Negative for: history of anesthetic complications  Airway Mallampati: III   Neck ROM: Full    Dental  (+) Missing, Implants   Pulmonary neg shortness of breath, neg COPD, neg recent URI, former smoker   Pulmonary exam normal breath sounds clear to auscultation       Cardiovascular Exercise Tolerance: Good (-) hypertension(-) angina (-) Past MI and (-) Cardiac Stents Normal cardiovascular exam+ dysrhythmias (LAFB) (-) Valvular Problems/Murmurs Rhythm:Regular Rate:Normal  Palpitations   ECG 03/13/21:  Normal sinus rhythm  Left anterior fascicular block  Septal infarct (cited on or before 14-Oct-2019)  Abnormal ECG  When compared with ECG of 14-Oct-2019 10:46,  No significant change was found  Myocardial perfusion 03/19/20:  Normal myocardial perfusion scan no evidence of stress-induced myocardial ischemia ejection fraction of 64% conclusion negative scan   Neuro/Psych  Headaches, Seizures -,  PSYCHIATRIC DISORDERS  Depression    CVA (04/2020 with residual memory deficit; on Plavix), Residual Symptoms    GI/Hepatic negative GI ROS,,,  Endo/Other  Hypothyroidism    Renal/GU negative Renal ROS     Musculoskeletal  (+) Arthritis ,    Abdominal   Peds  Hematology  (+) Blood dyscrasia, anemia   Anesthesia Other Findings Cardiology note 09/10/21:  73 y.o. female with  1. History of CVA (cerebrovascular accident)  2. QT prolongation  3. Primary hypertension  4. Dyslipidemia  5. Unsteady gait  6. Chronic fatigue  7. Angina at rest (CMS-HCC)  8. SOB (shortness of breath)  9. Palpitation  10. Peripheral edema  11. Cerebrovascular accident (CVA) due to embolism of cerebral artery (CMS-HCC)   Plan   1 history of CVA no clear evidence of atrial fibrillation patient just on aspirin and Plavix at the patient follow-up with neurology 2 recent diagnosed with new breast cancer to undergo radiation treatment no chemo as well as lumpectomy 3 generalized fatigue multifactorial persistent but reasonably stable continue conservative therapy 4 hyperlipidemia continue Lipitor therapy for lipid management 5 palpitations mild intermittent recurrent continue conservative management will consider beta-blocker Cardizem 6 mild ataxia unsteady on feet no recent falls consider physical therapy continue current medical management 7 have the patient follow-up in 1 year  Return in about 6 months (around 03/13/2022).   Reproductive/Obstetrics Endometrial CA s/p hysterectomy                             Anesthesia Physical Anesthesia Plan  ASA: 2  Anesthesia Plan: General   Post-op Pain Management:    Induction: Intravenous  PONV Risk Score and Plan: 3 and Treatment may vary due to age or medical condition, Propofol infusion and TIVA  Airway Management Planned: Natural Airway and Nasal Cannula  Additional Equipment:   Intra-op Plan:   Post-operative Plan:   Informed Consent: I have reviewed the patients History and Physical, chart, labs and discussed the procedure including the risks, benefits and alternatives for the proposed anesthesia with the patient or authorized representative who has indicated his/her understanding and acceptance.     Dental advisory given  Plan Discussed with: CRNA  Anesthesia Plan Comments: (Patient consented for risks of anesthesia including but not limited to:  - adverse reactions to medications - damage to eyes, teeth, lips or other oral mucosa -  nerve damage due to positioning  - sore throat or hoarseness - damage to heart, brain, nerves, lungs, other parts of body or loss of life  Informed patient about role of CRNA in peri- and  intra-operative care.  Patient voiced understanding.)        Anesthesia Quick Evaluation

## 2023-04-16 NOTE — H&P (Signed)
 Natalie Repress, MD [address]  [address]  [place], [place] [address]  Main: [phone]  Fax: [phone] Pager: [phone]  Primary Care Physician:  Alba Cory, MD Primary Gastroenterologist:  Dr. Arlyss [name]  Pre-Procedure History & Physical: HPI:  Natalie [name] is a 73 y.o. adult is here for an colonoscopy.   Past Medical History:  Diagnosis Date   Acute bronchospasm due to viral infection 2020   Due to pollen   Acute ischemic left MCA stroke (HCC) 04/19/2020   a.) CTA head/neck 04/19/2020 --> nearly occlusive thrombus at bifurcation of a proximal M2 MCA branch approximately 6 mm from the MCA bifurcation. Subsequent occlusion of a proximal left M3 MCA branch   Allergy    Anemia    Angina at rest [facility])    Breast cancer, left (HCC) 08/21/2021   a.) stereotactic Bx 08/21/2021 --> IMC (G1, ER/PR +, Her2/neu -)   Cataract    a.) s/p extraction with IOL placement   Depressive disorder    Diverticulosis    Dyspnea    Epistaxis    Fatigue    Hearing loss    History of 2019 novel coronavirus disease (COVID-19) 12/18/2020   History of ischemic multifocal posterior circulation stroke 02/28/2020   a.) presented to ED with increasing episodes of  transient vertical binocular diplopia; MRI 02/27/2021 --> multiple punctate acute to subacute ischemic infarcts involving the cortical/subcortical aspects of both parieto-occipital regions   History of loop recorder    History of radiation therapy    Vagina-02/11/21-03/14/21- Dr. Antony Blackbird   HLD (hyperlipidemia)    Hypothyroidism    LAFB (left anterior fascicular block)    Long term current use of antithrombotics/antiplatelets    a.) on DAPT therapy (ASA + clopidogrel)   Memory change    Migraine    Osteoarthritis    Osteoporosis    Palpitations    Personal history of radiation therapy    Premature menopause    QT prolongation    Seizures (HCC) 1994   history of mini seizures, possible migraine induced    Stage I adenocarcinoma of endometrium (HCC) 12/18/2020   a.) stage 1A; grade 1 (pT1a, pN0); b.) s/p TLH/BSO 12/18/2020 + adjuvant vaginal brachytherapy (30 Gy over 5 fractions between 02/11/2021 - 03/14/2021)   Vitamin D deficiency     Past Surgical History:  Procedure Laterality Date   ABDOMINAL HYSTERECTOMY  12/18/2020   ABLATION  2006   AXILLARY SENTINEL NODE BIOPSY Left 09/25/2021   Procedure: AXILLARY SENTINEL NODE BIOPSY;  Surgeon: Earline Mayotte, MD;  Location: [facility] ORS;  Service: General;  Laterality: Left;   BREAST BIOPSY Left 08/21/2021   Stereo Bx, X-clip; path --> IMC (G1, ER/PR +, Her2/neu -)   BREAST LUMPECTOMY Left 09/25/2021   BREAST LUMPECTOMY WITH NEEDLE LOCALIZATION Left 09/25/2021   Procedure: BREAST LUMPECTOMY WITH NEEDLE LOCALIZATION;  Surgeon: Earline Mayotte, MD;  Location: [facility] ORS;  Service: General;  Laterality: Left;   BUBBLE STUDY  04/23/2020   Procedure: BUBBLE STUDY;  Surgeon: Laurey Morale, MD;  Location: [facility] ENDOSCOPY;  Service: Cardiovascular;;   CATARACT EXTRACTION W/ INTRAOCULAR LENS IMPLANT Bilateral    COLONOSCOPY     COLONOSCOPY WITH PROPOFOL N/A 12/22/2018   Procedure: COLONOSCOPY WITH PROPOFOL;  Surgeon: Pasty Spillers, MD;  Location: [facility] ENDOSCOPY;  Service: Endoscopy;  Laterality: N/A;   EYE SURGERY  Spring 2018   Cataracts both eyes   HYSTEROSCOPY WITH D & C N/A 11/01/2020  Procedure: DILATATION AND CURETTAGE /HYSTEROSCOPY WITH MYOSURE;  Surgeon: Carver Fila, MD;  Location: [facility] ORS;  Service: Gynecology;  Laterality: N/A;   LAPAROTOMY  12/18/2020   Procedure: LAPAROTOMY;  Surgeon: Carver Fila, MD;  Location: [facility] ORS;  Service: Gynecology;;   LOOP RECORDER INSERTION N/A 04/23/2020   Procedure: LOOP RECORDER INSERTION;  Surgeon: Marinus Maw, MD;  Location: [facility] INVASIVE CV LAB;  Service: Cardiovascular;  Laterality: N/A;   ROBOTIC ASSISTED TOTAL HYSTERECTOMY WITH BILATERAL SALPINGO OOPHERECTOMY Bilateral  12/18/2020   Procedure: XI ROBOTIC ASSISTED TOTAL HYSTERECTOMY WITH BILATERAL SALPINGO OOPHORECTOMY;  Surgeon: Carver Fila, MD;  Location: [facility] ORS;  Service: Gynecology;  Laterality: Bilateral;   SENTINEL NODE BIOPSY N/A 12/18/2020   Procedure: SENTINEL NODE BIOPSY;  Surgeon: Carver Fila, MD;  Location: [facility] ORS;  Service: Gynecology;  Laterality: N/A;   TEE WITHOUT CARDIOVERSION N/A 04/23/2020   Procedure: TRANSESOPHAGEAL ECHOCARDIOGRAM (TEE);  Surgeon: Laurey Morale, MD;  Location: [facility] ENDOSCOPY;  Service: Cardiovascular;  Laterality: N/A;   TONSILLECTOMY      Prior to Admission medications   Medication Sig Start Date End Date Taking? Authorizing Provider  alendronate (FOSAMAX) 70 MG tablet TAKE 1 TABLET(70 MG) BY MOUTH 1 TIME A WEEK WITH A FULL GLASS OF WATER AND ON AN EMPTY STOMACH 02/06/23  Yes Alinda Dooms, NP  aspirin 81 MG EC tablet Take by mouth.   Yes [provider]  aspirin-acetaminophen-caffeine (EXCEDRIN MIGRAINE) [phone] MG tablet Take by mouth every 6 (six) hours as needed for headache or migraine.   Yes [provider]  atorvastatin (LIPITOR) 40 MG tablet Take 1 tablet (40 mg total) by mouth daily. 09/12/22  Yes Sowles, Danna Hefty, MD  buPROPion [facility] SR) 150 MG 12 hr tablet Take 1 tablet (150 mg total) by mouth 2 (two) times daily. 09/12/22  Yes Sowles, Danna Hefty, MD  calcium carbonate (OSCAL) 1500 (600 Ca) MG TABS tablet Take 600 mg of elemental calcium by mouth 1 day or 1 dose.   Yes [provider]  cholecalciferol (VITAMIN D3) 25 MCG (1000 UNIT) tablet Take 1,000 Units by mouth daily.   Yes [provider]  citalopram (CELEXA) 40 MG tablet Take 1 tablet (40 mg total) by mouth daily. 09/12/22  Yes Sowles, Danna Hefty, MD  Cyanocobalamin (VITAMIN B-12) 1000 MCG SUBL Place 1 tablet (1,000 mcg total) under the tongue 2 (two) times a week. 06/20/21  Yes Sowles, Danna Hefty, MD  letrozole [facility]) 2.5 MG tablet TAKE 1 TABLET(2.5 MG) BY  MOUTH DAILY 04/07/23  Yes Alinda Dooms, NP  levothyroxine (SYNTHROID) 75 MCG tablet Take 1 tablet (75 mcg total) by mouth daily before breakfast. 09/12/22  Yes Sowles, Danna Hefty, MD  Multiple Vitamin (MULTIVITAMIN WITH MINERALS) TABS tablet Take 1 tablet by mouth daily.   Yes [provider]  Ubrogepant (UBRELVY) 100 MG TABS Take 100 mg by mouth daily as needed (migraines).   Yes Lonell Face, MD    Allergies as of 03/20/2023 - Review Complete 03/19/2023  Allergen Reaction Noted   Sulfamethoxazole-trimethoprim Hives 10/26/2014   Tnkase [tenecteplase] Swelling 12/04/2021    Family History  Problem Relation Age of Onset   Early death Father        suicide   Depression Father    Diabetes Father    Cancer Father        prostate   Hearing loss Father        due to war   Alzheimer's disease Mother  Heart disease Brother    Atrial fibrillation Brother    Heart disease Maternal Aunt    Dementia Maternal Aunt    Heart disease Maternal Uncle    Dementia Maternal Grandmother    Heart disease Brother    Atrial fibrillation Brother    Colon cancer Neg Hx    Breast cancer Neg Hx    Ovarian cancer Neg Hx    Pancreatic cancer Neg Hx    Endometrial cancer Neg Hx     Social History   Socioeconomic History   Marital status: Single    Spouse name: Not on file   Number of children: 1   Years of education: Not on file   Highest education level: Professional school degree (e.g., MD, DDS, DVM, JD)  Occupational History   Occupation: retired   Tobacco Use   Smoking status: Former    Current packs/day: 0.00    Average packs/day: 0.5 packs/day for 6.0 years (3.0 ttl pk-yrs)    Types: Cigarettes    Start date: 02/12/1968    Quit date: 02/11/1974    Years since quitting: 49.2   Smokeless tobacco: Never  Vaping Use   Vaping status: Never Used  Substance and Sexual Activity   Alcohol use: Yes    Comment: rare   Drug use: Never   Sexual activity: Not Currently    Comment: Don't  use not sexually active  Other Topics Concern   Not on file  Social History Narrative   Raised an adopted child on her own   Working part time reviewed documents   Social Drivers of Corporate investment banker Strain: Low Risk  (04/09/2023)   Overall Financial Resource Strain (CARDIA)    Difficulty of Paying Living Expenses: Not hard at all  Food Insecurity: No Food Insecurity (04/09/2023)   Hunger Vital Sign    Worried About Running Out of Food in the Last Year: Never true    Ran Out of Food in the Last Year: Never true  Transportation Needs: No Transportation Needs (04/09/2023)   PRAPARE - Administrator, Civil Service (Medical): No    Lack of Transportation (Non-Medical): No  Physical Activity: Sufficiently Active (04/09/2023)   Exercise Vital Sign    Days of Exercise per Week: 3 days    Minutes of Exercise per Session: 50 min  Stress: No Stress Concern Present (04/09/2023)   [employer] of Occupational Health - Occupational Stress Questionnaire    Feeling of Stress : Not at all  Social Connections: Moderately Integrated (04/09/2023)   Social Connection and Isolation Panel [NHANES]    Frequency of Communication with Friends and Family: More than three times a week    Frequency of Social Gatherings with Friends and Family: More than three times a week    Attends Religious Services: More than 4 times per year    Active Member of [employer] or Organizations: Yes    Attends Engineer, structural: More than 4 times per year    Marital Status: Never married  Intimate Partner Violence: Not At Risk (04/09/2023)   Humiliation, Afraid, Rape, and Kick questionnaire    Fear of Current or Ex-Partner: No    Emotionally Abused: No    Physically Abused: No    Sexually Abused: No    Review of Systems: See HPI, otherwise negative ROS  Physical Exam: BP 129/80   Pulse 70   Temp (!) 96.8 F (36 C) (Temporal)   Resp 17   Ht  5\' 5"  (1.651 m)   Wt 68.9 kg   SpO2 100%   BMI  25.29 kg/m  General:   Alert,  pleasant and cooperative in NAD Head:  Normocephalic and atraumatic. Neck:  Supple; no masses or thyromegaly. Lungs:  Clear throughout to auscultation.    Heart:  Regular rate and rhythm. Abdomen:  Soft, nontender and nondistended. Normal bowel sounds, without guarding, and without rebound.   Neurologic:  Alert and  oriented x4;  grossly normal neurologically.  Impression/Plan: [name] is here for an colonoscopy to be performed for h/o colon polyps  Risks, benefits, limitations, and alternatives regarding  colonoscopy have been reviewed with the patient.  Questions have been answered.  All parties agreeable.   Lannette Donath, MD  04/16/2023, 8:21 AM

## 2023-04-17 ENCOUNTER — Encounter: Payer: Self-pay | Admitting: Gastroenterology

## 2023-04-23 DIAGNOSIS — C50919 Malignant neoplasm of unspecified site of unspecified female breast: Secondary | ICD-10-CM

## 2023-04-23 NOTE — Progress Notes (Signed)
 Daily Session Note  Patient Details  Name: Natalie [name] MRN: [id number] Date of Birth: [birth year] Referring Provider:   Doristine Devoid Cancer Associated Rehabilitation & Exercise from 03/31/2023 in [facility] Cardiac and Pulmonary Rehab  Referring Provider Owens Shark, MD       Encounter Date: 04/23/2023  Check In:  Session Check In - 04/23/23 1242       Check-In   Supervising physician immediately available to respond to emergencies See telemetry face sheet for immediately available ER MD    Location [facility]-Cardiac & Pulmonary Rehab    Staff Present Rory Percy, MS, Exercise Physiologist;Maxon Suzzette Righter, Exercise Physiologist    Virtual Visit No    Fall or balance concerns reported    No    Warm-up and Cool-down Performed on first and last piece of equipment    Resistance Training Performed Yes    VAD Patient? No    PAD/SET Patient? No      Pain Assessment   Currently in Pain? No/denies    Multiple Pain Sites No                Social History   Tobacco Use  Smoking Status Former   Current packs/day: 0.00   Average packs/day: 0.5 packs/day for 6.0 years (3.0 ttl pk-yrs)   Types: Cigarettes   Start date: 02/12/1968   Quit date: 02/11/1974   Years since quitting: 49.2  Smokeless Tobacco Never    Goals Met:  Independence with exercise equipment Exercise tolerated well No report of concerns or symptoms today  Goals Unmet:  Not Applicable  Comments: Pt able to follow exercise prescription today without complaint.  Will continue to monitor for progression.    Dr. Bethann Punches is Medical Director for [facility] Cardiac Rehabilitation.  Dr. Vida Rigger is Medical Director for [facility] Pulmonary Rehabilitation.

## 2023-04-28 DIAGNOSIS — C50919 Malignant neoplasm of unspecified site of unspecified female breast: Secondary | ICD-10-CM

## 2023-04-28 NOTE — Progress Notes (Signed)
 Daily Session Note  Patient Details  Name: Natalie [name] MRN: [id number] Date of Birth: 09-02-1950 Referring Provider:   Doristine Devoid Cancer Associated Rehabilitation & Exercise from 03/31/2023 in [facility] Cardiac and Pulmonary Rehab  Referring Provider Owens Shark, MD       Encounter Date: 04/28/2023  Check In:  Session Check In - 04/28/23 1234       Check-In   Supervising physician immediately available to respond to emergencies See telemetry face sheet for immediately available ER MD    Location [facility]-Cardiac & Pulmonary Rehab    Staff Present Rory Percy, MS, Exercise Physiologist;Anwitha Mapes, BS, Exercise Physiologist    Virtual Visit No    Medication changes reported     No    Fall or balance concerns reported    No    Warm-up and Cool-down Performed on first and last piece of equipment    Resistance Training Performed Yes    VAD Patient? No    PAD/SET Patient? No      Pain Assessment   Currently in Pain? No/denies    Multiple Pain Sites No                Social History   Tobacco Use  Smoking Status Former   Current packs/day: 0.00   Average packs/day: 0.5 packs/day for 6.0 years (3.0 ttl pk-yrs)   Types: Cigarettes   Start date: 02/12/1968   Quit date: 02/11/1974   Years since quitting: 49.2  Smokeless Tobacco Never    Goals Met:  Independence with exercise equipment Exercise tolerated well No report of concerns or symptoms today  Goals Unmet:  Not Applicable  Comments: Pt able to follow exercise prescription today without complaint.  Will continue to monitor for progression.   Dr. Bethann Punches is Medical Director for [facility] Cardiac Rehabilitation.  Dr. Vida Rigger is Medical Director for [facility] Pulmonary Rehabilitation.

## 2023-04-29 ENCOUNTER — Other Ambulatory Visit: Payer: Self-pay | Admitting: Nurse Practitioner

## 2023-04-30 DIAGNOSIS — C50919 Malignant neoplasm of unspecified site of unspecified female breast: Secondary | ICD-10-CM

## 2023-04-30 NOTE — Progress Notes (Signed)
 Daily Session Note  Patient Details  Name: Natalie [name] MRN: [id number] Date of Birth: [birth year] Referring Provider:   Doristine Devoid Cancer Associated Rehabilitation & Exercise from 03/31/2023 in [facility] Cardiac and Pulmonary Rehab  Referring Provider Owens Shark, MD       Encounter Date: 04/30/2023  Check In:  Session Check In - 04/30/23 1228       Check-In   Supervising physician immediately available to respond to emergencies See telemetry face sheet for immediately available ER MD    Location [facility]-Cardiac & Pulmonary Rehab    Staff Present Rory Percy, MS, Exercise Physiologist;Maxon Conetta BS, Exercise Physiologist    Virtual Visit No    Medication changes reported     No    Fall or balance concerns reported    No    Warm-up and Cool-down Performed on first and last piece of equipment    Resistance Training Performed Yes    VAD Patient? No    PAD/SET Patient? No      Pain Assessment   Currently in Pain? No/denies    Multiple Pain Sites No                Social History   Tobacco Use  Smoking Status Former   Current packs/day: 0.00   Average packs/day: 0.5 packs/day for 6.0 years (3.0 ttl pk-yrs)   Types: Cigarettes   Start date: 02/12/1968   Quit date: 02/11/1974   Years since quitting: 49.2  Smokeless Tobacco Never    Goals Met:  Independence with exercise equipment Exercise tolerated well No report of concerns or symptoms today  Goals Unmet:  Not Applicable  Comments: Pt able to follow exercise prescription today without complaint.  Will continue to monitor for progression.    Dr. Bethann Punches is Medical Director for [facility] Cardiac Rehabilitation.  Dr. Vida Rigger is Medical Director for [facility] Pulmonary Rehabilitation.

## 2023-05-07 ENCOUNTER — Encounter: Payer: Medicare Other | Attending: Oncology

## 2023-05-07 DIAGNOSIS — C50919 Malignant neoplasm of unspecified site of unspecified female breast: Secondary | ICD-10-CM | POA: Insufficient documentation

## 2023-05-07 NOTE — Progress Notes (Signed)
 Daily Session Note  Patient Details  Name: ANAID HANEY MRN: [id number] Date of Birth: 1950-10-25 Referring Provider:   Doristine Devoid Cancer Associated Rehabilitation & Exercise from 03/31/2023 in [facility] Cardiac and Pulmonary Rehab  Referring Provider Owens Shark, MD       Encounter Date: 05/07/2023  Check In:  Session Check In - 05/07/23 1226       Check-In   Supervising physician immediately available to respond to emergencies See telemetry face sheet for immediately available ER MD    Location [facility]-Cardiac & Pulmonary Rehab    Staff Present Rory Percy, MS, Exercise Physiologist;Maxon Conetta BS, Exercise Physiologist    Virtual Visit No    Medication changes reported     No    Fall or balance concerns reported    No    Warm-up and Cool-down Performed on first and last piece of equipment    Resistance Training Performed Yes    VAD Patient? No    PAD/SET Patient? No      Pain Assessment   Currently in Pain? No/denies    Multiple Pain Sites No                Social History   Tobacco Use  Smoking Status Former   Current packs/day: 0.00   Average packs/day: 0.5 packs/day for 6.0 years (3.0 ttl pk-yrs)   Types: Cigarettes   Start date: 02/12/1968   Quit date: 02/11/1974   Years since quitting: 49.2  Smokeless Tobacco Never    Goals Met:  Independence with exercise equipment Exercise tolerated well No report of concerns or symptoms today  Goals Unmet:  Not Applicable  Comments: Pt able to follow exercise prescription today without complaint.  Will continue to monitor for progression.    Dr. Bethann Punches is Medical Director for [facility] Cardiac Rehabilitation.  Dr. Vida Rigger is Medical Director for [facility] Pulmonary Rehabilitation.

## 2023-05-12 DIAGNOSIS — C50919 Malignant neoplasm of unspecified site of unspecified female breast: Secondary | ICD-10-CM

## 2023-05-12 NOTE — Progress Notes (Signed)
 Daily Session Note  Patient Details  Name: Natalie [name] MRN: [id number] Date of Birth: 09/20/1950 Referring Provider:   Doristine Devoid Cancer Associated Rehabilitation & Exercise from 03/31/2023 in [facility] Cardiac and Pulmonary Rehab  Referring Provider Owens Shark, MD       Encounter Date: 05/12/2023  Check In:  Session Check In - 05/12/23 1221       Check-In   Supervising physician immediately available to respond to emergencies See telemetry face sheet for immediately available ER MD    Location [facility]-Cardiac & Pulmonary Rehab    Staff Present Rory Percy, MS, Exercise Physiologist;Inez Stantz, BS, Exercise Physiologist    Virtual Visit No    Medication changes reported     No    Fall or balance concerns reported    No    Warm-up and Cool-down Performed on first and last piece of equipment    Resistance Training Performed Yes    VAD Patient? No    PAD/SET Patient? No      Pain Assessment   Currently in Pain? No/denies    Multiple Pain Sites No                Social History   Tobacco Use  Smoking Status Former   Current packs/day: 0.00   Average packs/day: 0.5 packs/day for 6.0 years (3.0 ttl pk-yrs)   Types: Cigarettes   Start date: 02/12/1968   Quit date: 02/11/1974   Years since quitting: 49.2  Smokeless Tobacco Never    Goals Met:  Independence with exercise equipment Exercise tolerated well No report of concerns or symptoms today  Goals Unmet:  Not Applicable  Comments: Pt able to follow exercise prescription today without complaint.  Will continue to monitor for progression.    Dr. Bethann Punches is Medical Director for [facility] Cardiac Rehabilitation.  Dr. Vida Rigger is Medical Director for [facility] Pulmonary Rehabilitation.

## 2023-05-16 ENCOUNTER — Encounter: Payer: Self-pay | Admitting: Family Medicine

## 2023-05-18 ENCOUNTER — Ambulatory Visit: Admitting: Internal Medicine

## 2023-05-18 ENCOUNTER — Encounter: Payer: Self-pay | Admitting: Internal Medicine

## 2023-05-18 ENCOUNTER — Other Ambulatory Visit: Payer: Self-pay

## 2023-05-18 ENCOUNTER — Ambulatory Visit: Payer: Self-pay

## 2023-05-18 VITALS — BP 110/70 | HR 74 | Temp 98.1°F | Resp 16 | Ht 66.0 in | Wt 154.4 lb

## 2023-05-18 DIAGNOSIS — R42 Dizziness and giddiness: Secondary | ICD-10-CM

## 2023-05-18 DIAGNOSIS — R233 Spontaneous ecchymoses: Secondary | ICD-10-CM

## 2023-05-18 DIAGNOSIS — D696 Thrombocytopenia, unspecified: Secondary | ICD-10-CM

## 2023-05-18 NOTE — Telephone Encounter (Signed)
  Chief Complaint: Dizzy Symptoms: dizzy Frequency: intermittent Pertinent Negatives: Patient denies fever, falls, syncope Disposition: [] ED /[] Urgent [name] (no appt availability in office) / [x] Appointment(In office/virtual)/ []  [name]/ [] Home [name]/ [] Refused Recommended Disposition /[] [facility]/ []  Follow-up with PCP Additional Notes:  Patient calling in to request appointment for dizziness, had been communicating with office staff via MyChart, calling now to schedule. She has been experiencing intermittent dizziness and now has bruising around eye without injury. Not currently dizzy while on this call. She is unsure if she is hydrated, she tries to keep up with fluid intake but reports she is not always good at it. She is concerned with her symptoms due to her personal history of stroke. No acute available with PCP, acute visit scheduled today with alternate provider.   Copied from CRM [id number]. Topic: Clinical - Red Word Triage >> May 18, 2023 10:33 AM Natalie [name] wrote: Red Word that prompted transfer to Nurse Triage: Dizziness and black eye. Says its because of 3 previous strokes, most recent in Nov 2023. Was already in conversation with the office per mychart about coming in. Patient wants to be seen at 920a tmrw with Natalie [name]. Reason for Disposition  [1] MODERATE dizziness (e.g., interferes with normal activities) AND [2] has NOT been evaluated by doctor (or NP/PA) for this  (Exception: Dizziness caused by heat exposure, sudden standing, or poor fluid intake.)  Protocols used: Dizziness - Lightheadedness-A-AH

## 2023-05-18 NOTE — Progress Notes (Addendum)
 Acute Office Visit  Subjective:     Patient ID: Natalie [name], adult    DOB: 1950/03/01, 73 y.o.   MRN: [id number]  Chief Complaint  Patient presents with   Bleeding/Bruising    Corner of left eye for 3 days    HPI Patient is in today for dizziness, fatigue and bruising around her right eye with unknown cause.   Discussed the use of AI scribe software for clinical note transcription with the patient, who gave verbal consent to proceed.  History of Present Illness The patient, with a history of multiple strokes, endometrial cancer, and breast cancer, presents with dizziness, fatigue, and unexplained bruising. She reports feeling 'really tired' despite sleeping well at night with the aid of a mandibular insert for sleep apnea. The fatigue is described as severe, with the patient feeling 'dead tired' just two hours after waking up in the morning.  The patient also reports an unexplained bruise that appeared suddenly in the middle of the day. The bruise is oddly shaped and extends from the top to the bottom of the patient's eyelid. The patient denies any trauma or vision changes associated with the bruise.  [name] history includes multiple strokes and two types of cancer: endometrial and breast. The patient is currently taking letrozole, prescribed by a cancer doctor, for the treatment of her cancers. The patient also mentions a history of low platelets, which was discovered during a hospital stay for a stroke two years ago and led to the diagnosis of her cancers. Platelets also slightly low on labs here in March.    Review of Systems  Constitutional:  Positive for malaise/fatigue. Negative for chills, fever and weight loss.  Eyes:  Negative for blurred vision and pain.  Neurological:  Positive for dizziness.        Objective:    BP 110/70 (Cuff Size: Normal)   Pulse 74   Temp 98.1 F (36.7 C) (Oral)   Resp 16   Ht 5\' 6"  (1.676 m)   Wt 154 lb 6.4 oz (70 kg)   SpO2  96%   BMI 24.92 kg/m  BP Readings from Last 3 Encounters:  05/18/23 110/70  04/16/23 (!) 140/75  04/13/23 124/76   Wt Readings from Last 3 Encounters:  05/18/23 154 lb 6.4 oz (70 kg)  04/16/23 152 lb (68.9 kg)  04/13/23 150 lb 3.2 oz (68.1 kg)      Physical Exam Constitutional:      Appearance: Normal appearance.  HENT:     Head: Normocephalic and atraumatic.  Eyes:     Extraocular Movements: Extraocular movements intact.     Conjunctiva/sclera: Conjunctivae normal.     Pupils: Pupils are equal, round, and reactive to light.     Comments: Hematoma on outer upper eyelid wrapping around to the outer lower eyelid  Cardiovascular:     Rate and Rhythm: Normal rate and regular rhythm.  Pulmonary:     Effort: Pulmonary effort is normal.     Breath sounds: Normal breath sounds.  Musculoskeletal:     Right lower leg: No edema.     Left lower leg: No edema.  Skin:    General: Skin is warm and dry.  Neurological:     General: No focal deficit present.     Mental Status: She is alert. Mental status is at baseline.  Psychiatric:        Mood and Affect: Mood normal.        Behavior: Behavior normal.  No results found for any visits on 05/18/23.      Assessment & Plan:   Assessment & Plan Fatigue Fatigue possibly related to thrombocytopenia. Recent labs showed slightly low platelets. - Repeat labs to check platelet count, white blood cells, PT, and INR  Hematoma Hematoma likely due to thrombocytopenia. Expected to resolve spontaneously. - Repeat labs to check platelet count, white blood cells, PT, and INR before 4 PM.   - CBC w/Diff/Platelet - INR/PT   Return if symptoms worsen or fail to improve.  Rockney Cid, DO

## 2023-05-19 ENCOUNTER — Encounter: Payer: Self-pay | Admitting: Internal Medicine

## 2023-05-19 LAB — CBC WITH DIFFERENTIAL/PLATELET
Absolute Lymphocytes: 1216 {cells}/uL (ref 850–3900)
Absolute Monocytes: 221 {cells}/uL (ref 200–950)
Basophils Absolute: 10 {cells}/uL (ref 0–200)
Basophils Relative: 0.3 %
Eosinophils Absolute: 99 {cells}/uL (ref 15–500)
Eosinophils Relative: 3.1 %
HCT: 39.3 % (ref 35.0–45.0)
Hemoglobin: 12.8 g/dL (ref 11.7–15.5)
MCH: 32 pg (ref 27.0–33.0)
MCHC: 32.6 g/dL (ref 32.0–36.0)
MCV: 98.3 fL (ref 80.0–100.0)
MPV: 9.9 fL (ref 7.5–12.5)
Monocytes Relative: 6.9 %
Neutro Abs: 1654 {cells}/uL (ref 1500–7800)
Neutrophils Relative %: 51.7 %
Platelets: 171 10*3/uL (ref 140–400)
RBC: 4 10*6/uL (ref 3.80–5.10)
RDW: 11.3 % (ref 11.0–15.0)
Total Lymphocyte: 38 %
WBC: 3.2 10*3/uL — ABNORMAL LOW (ref 3.8–10.8)

## 2023-05-19 LAB — PROTIME-INR
INR: 1
Prothrombin Time: 10.9 s (ref 9.0–11.5)

## 2023-05-21 DIAGNOSIS — C50919 Malignant neoplasm of unspecified site of unspecified female breast: Secondary | ICD-10-CM

## 2023-05-21 NOTE — Progress Notes (Signed)
 Daily Session Note  Patient Details  Name: Natalie [name] MRN: [id number] Date of Birth: 20-Oct-1950 Referring Provider:   Gattis Kass Cancer Associated Rehabilitation & Exercise from 03/31/2023 in [facility] Cardiac and Pulmonary Rehab  Referring Provider Seretha Dance, MD       Encounter Date: 05/21/2023  Check In:  Session Check In - 05/21/23 1239       Check-In   Supervising physician immediately available to respond to emergencies See telemetry face sheet for immediately available ER MD    Location [facility]-Cardiac & Pulmonary Rehab    Staff Present Lyell Samuel, MS, Exercise Physiologist;Maxon Conetta BS, Exercise Physiologist    Virtual Visit No    Medication changes reported     No    Fall or balance concerns reported    No    Warm-up and Cool-down Performed on first and last piece of equipment    Resistance Training Performed Yes    VAD Patient? No    PAD/SET Patient? No      Pain Assessment   Currently in Pain? No/denies    Multiple Pain Sites No                Social History   Tobacco Use  Smoking Status Former   Current packs/day: 0.00   Average packs/day: 0.5 packs/day for 6.0 years (3.0 ttl pk-yrs)   Types: Cigarettes   Start date: 02/12/1968   Quit date: 02/11/1974   Years since quitting: 49.3  Smokeless Tobacco Never    Goals Met:  Independence with exercise equipment Exercise tolerated well No report of concerns or symptoms today  Goals Unmet:  Not Applicable  Comments: Pt able to follow exercise prescription today without complaint.  Will continue to monitor for progression.    Dr. Firman Hughes is Medical Director for [facility] Cardiac Rehabilitation.  Dr. Fuad Aleskerov is Medical Director for [facility] Pulmonary Rehabilitation.

## 2023-05-22 ENCOUNTER — Telehealth: Payer: Self-pay

## 2023-05-22 NOTE — Telephone Encounter (Signed)
 Nutrition  Referral received from [place], [place].  Dizziness at Tai Chi Class this week.  Called and left message for patient with call back number.  Edan Juday B. Zollie Hipp, CSO, LDN Registered Dietitian [phone]

## 2023-05-26 DIAGNOSIS — C50919 Malignant neoplasm of unspecified site of unspecified female breast: Secondary | ICD-10-CM

## 2023-05-26 NOTE — Progress Notes (Signed)
 Daily Session Note  Patient Details  Name: ALAYZIA PAVLOCK MRN: [id number] Date of Birth: 07/04/1950 Referring Provider:   Gattis Kass Cancer Associated Rehabilitation & Exercise from 03/31/2023 in [facility] Cardiac and Pulmonary Rehab  Referring Provider Seretha Dance, MD       Encounter Date: 05/26/2023  Check In:  Session Check In - 05/26/23 1227       Check-In   Supervising physician immediately available to respond to emergencies See telemetry face sheet for immediately available ER MD    Location [facility]-Cardiac & Pulmonary Rehab    Staff Present Lyell Samuel, MS, Exercise Physiologist;Lunden Mcleish, BS, Exercise Physiologist    Virtual Visit No    Medication changes reported     No    Fall or balance concerns reported    No    Warm-up and Cool-down Performed on first and last piece of equipment    Resistance Training Performed Yes    VAD Patient? No    PAD/SET Patient? No      Pain Assessment   Currently in Pain? No/denies    Multiple Pain Sites No                Social History   Tobacco Use  Smoking Status Former   Current packs/day: 0.00   Average packs/day: 0.5 packs/day for 6.0 years (3.0 ttl pk-yrs)   Types: Cigarettes   Start date: 02/12/1968   Quit date: 02/11/1974   Years since quitting: 49.3  Smokeless Tobacco Never    Goals Met:  Independence with exercise equipment Exercise tolerated well No report of concerns or symptoms today  Goals Unmet:  Not Applicable  Comments: Pt able to follow exercise prescription today without complaint.  Will continue to monitor for progression.   Dr. Firman Hughes is Medical Director for [facility] Cardiac Rehabilitation.  Dr. Fuad Aleskerov is Medical Director for [facility] Pulmonary Rehabilitation.

## 2023-05-28 DIAGNOSIS — C50919 Malignant neoplasm of unspecified site of unspecified female breast: Secondary | ICD-10-CM

## 2023-05-28 NOTE — Progress Notes (Signed)
 Daily Session Note  Patient Details  Name: Natalie [name] MRN: [id number] Date of Birth: [birth year] Referring Provider:   Gattis Kass Cancer Associated Rehabilitation & Exercise from 03/31/2023 in [facility] Cardiac and Pulmonary Rehab  Referring Provider Seretha Dance, MD       Encounter Date: 05/28/2023  Check In:  Session Check In - 05/28/23 1234       Check-In   Supervising physician immediately available to respond to emergencies See telemetry face sheet for immediately available ER MD    Location [facility]-Cardiac & Pulmonary Rehab    Staff Present Nazanin Kinner BS, Exercise Physiologist;Margaret Best, MS, Exercise Physiologist    Virtual Visit No    Medication changes reported     No    Fall or balance concerns reported    No    Tobacco Cessation No Change    Warm-up and Cool-down Performed on first and last piece of equipment    Resistance Training Performed Yes    VAD Patient? No    PAD/SET Patient? No      Pain Assessment   Currently in Pain? No/denies    Multiple Pain Sites No                Social History   Tobacco Use  Smoking Status Former   Current packs/day: 0.00   Average packs/day: 0.5 packs/day for 6.0 years (3.0 ttl pk-yrs)   Types: Cigarettes   Start date: 02/12/1968   Quit date: 02/11/1974   Years since quitting: 49.3  Smokeless Tobacco Never    Goals Met:  Independence with exercise equipment Exercise tolerated well No report of concerns or symptoms today  Goals Unmet:  Not Applicable  Comments: Pt able to follow exercise prescription today without complaint.  Will continue to monitor for progression.    Dr. Firman Hughes is Medical Director for [facility] Cardiac Rehabilitation.  Dr. Fuad Aleskerov is Medical Director for [facility] Pulmonary Rehabilitation.

## 2023-06-02 DIAGNOSIS — C50919 Malignant neoplasm of unspecified site of unspecified female breast: Secondary | ICD-10-CM

## 2023-06-02 NOTE — Progress Notes (Signed)
 Daily Session Note  Patient Details  Name: Natalie [name] MRN: [id number] Date of Birth: 04/24/1950 Referring Provider:   Gattis Kass Cancer Associated Rehabilitation & Exercise from 03/31/2023 in [facility] Cardiac and Pulmonary Rehab  Referring Provider Seretha Dance, MD       Encounter Date: 06/02/2023  Check In:  Session Check In - 06/02/23 1235       Check-In   Supervising physician immediately available to respond to emergencies See telemetry face sheet for immediately available ER MD    Location [facility]-Cardiac & Pulmonary Rehab    Staff Present Lyell Samuel, MS, Exercise Physiologist;Atzin Buchta, BS, Exercise Physiologist    Virtual Visit No    Medication changes reported     No    Fall or balance concerns reported    No    Warm-up and Cool-down Performed on first and last piece of equipment    Resistance Training Performed Yes    VAD Patient? No    PAD/SET Patient? No      Pain Assessment   Currently in Pain? No/denies    Multiple Pain Sites No                Social History   Tobacco Use  Smoking Status Former   Current packs/day: 0.00   Average packs/day: 0.5 packs/day for 6.0 years (3.0 ttl pk-yrs)   Types: Cigarettes   Start date: 02/12/1968   Quit date: 02/11/1974   Years since quitting: 49.3  Smokeless Tobacco Never    Goals Met:  Independence with exercise equipment Exercise tolerated well No report of concerns or symptoms today  Goals Unmet:  Not Applicable  Comments: Pt able to follow exercise prescription today without complaint.  Will continue to monitor for progression.   Dr. Firman Hughes is Medical Director for [facility] Cardiac Rehabilitation.  Dr. Fuad Aleskerov is Medical Director for [facility] Pulmonary Rehabilitation.

## 2023-06-11 ENCOUNTER — Encounter: Payer: Medicare Other | Attending: Oncology

## 2023-06-11 ENCOUNTER — Inpatient Hospital Stay: Attending: Oncology

## 2023-06-11 DIAGNOSIS — C50919 Malignant neoplasm of unspecified site of unspecified female breast: Secondary | ICD-10-CM | POA: Insufficient documentation

## 2023-06-11 NOTE — Progress Notes (Signed)
 Daily Session Note  Patient Details  Name: Natalie [name] MRN: [id number] Date of Birth: [birth year] Referring Provider:   Gattis Kass Cancer Associated Rehabilitation & Exercise from 03/31/2023 in [facility] Cardiac and Pulmonary Rehab  Referring Provider Seretha Dance, MD       Encounter Date: 06/11/2023  Check In:  Session Check In - 06/11/23 1325       Check-In   Supervising physician immediately available to respond to emergencies See telemetry face sheet for immediately available ER MD    Location [facility]-Cardiac & Pulmonary Rehab    Staff Present Elius Etheredge BS, Exercise Physiologist;Meredith Manson Seitz RN,BSN    Virtual Visit No    Medication changes reported     No    Fall or balance concerns reported    No    Tobacco Cessation No Change    Warm-up and Cool-down Performed on first and last piece of equipment    Resistance Training Performed Yes    VAD Patient? No                Social History   Tobacco Use  Smoking Status Former   Current packs/day: 0.00   Average packs/day: 0.5 packs/day for 6.0 years (3.0 ttl pk-yrs)   Types: Cigarettes   Start date: 02/12/1968   Quit date: 02/11/1974   Years since quitting: 49.3  Smokeless Tobacco Never    Goals Met:  Independence with exercise equipment Exercise tolerated well No report of concerns or symptoms today  Goals Unmet:  Not Applicable  Comments: Pt able to follow exercise prescription today without complaint.  Will continue to monitor for progression.    Dr. Firman Hughes is Medical Director for [facility] Cardiac Rehabilitation.  Dr. Fuad Aleskerov is Medical Director for [facility] Pulmonary Rehabilitation.

## 2023-06-11 NOTE — Progress Notes (Signed)
 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 S/p lumpectomy.    Past medical history of endometrial cancer 2022 s/p surgery and radaition, stroke, depressive disorder, HLD.  S/p whole breast radiation.  Currently on letrozole   Met with patient in clinic.  Reports ongoing dizziness.  Does not think that her eating is related to eating.  Reports that she fasts about 16 hours a day (mostly at night) and eats 8 hours during the day. Eats around mid day usually soup or salad, sometimes oatmeal with nuts.  Likes to snack on nuts, fruit, yogurt, cheese, larabars.  Has 4-6 mini meals vs larger meals.  Drinks mostly water , decaf coffee.  Tries to avoid sugar.  Walks and participates in the CARE program and Tai Chi class.  Noted bouts of dizziness has been ongoing for awhile and MDs are aware of it.     Medications: Vit B 12, Vit D, MVI, calcium  carbonate   Labs: Vit D 76 in March 2025 Vit B 12 1115   Anthropometrics:   Height: 66 inches Weight: 158 lb 3 oz today 148 lb on 09/05/22 154 lb 3.2 oz on 06/26/22 BMI: 24  Weight gain in the last year   Estimated Energy Needs  Kcals: ~1700 Protein: 70-80 g protein Fluid:    NUTRITION DIAGNOSIS: none at this time    INTERVENTION:  Discussed AICR recommendations for Cancer Survivors.  Handout provided Encouraged hydration Encouraged source of protein (plant based or animal) at most of mini meals Patient states she wants to plan mini meals better using the guidelines we discussed Samples of ensure max protein shake given for patient to try.   Contact information provided   MONITORING, EVALUATION, GOAL: weight trends, intake   Next Visit: no follow-up RD available if needed  Tarae Wooden B. Zollie Hipp, CSO, LDN Registered Dietitian [phone]

## 2023-06-16 DIAGNOSIS — C50919 Malignant neoplasm of unspecified site of unspecified female breast: Secondary | ICD-10-CM

## 2023-06-16 NOTE — Progress Notes (Signed)
 Daily Session Note  Patient Details  Name: Natalie [name] MRN: [id number] Date of Birth: 10-27-1950 Referring Provider:   Gattis Kass Cancer Associated Rehabilitation & Exercise from 03/31/2023 in [facility] Cardiac and Pulmonary Rehab  Referring Provider Seretha Dance, MD       Encounter Date: 06/16/2023  Check In:  Session Check In - 06/16/23 1359       Check-In   Supervising physician immediately available to respond to emergencies See telemetry face sheet for immediately available ER MD    Location [facility]-Cardiac & Pulmonary Rehab    Staff Present Lyell Samuel, MS, Exercise Physiologist;Susanne Bice, RN, BSN, CCRP    Virtual Visit No    Medication changes reported     No    Fall or balance concerns reported    No    Tobacco Cessation No Change    Warm-up and Cool-down Performed on first and last piece of equipment    Resistance Training Performed Yes    VAD Patient? No    PAD/SET Patient? No      Pain Assessment   Currently in Pain? No/denies    Multiple Pain Sites No                Social History   Tobacco Use  Smoking Status Former   Current packs/day: 0.00   Average packs/day: 0.5 packs/day for 6.0 years (3.0 ttl pk-yrs)   Types: Cigarettes   Start date: 02/12/1968   Quit date: 02/11/1974   Years since quitting: 49.3  Smokeless Tobacco Never    Goals Met:  Independence with exercise equipment Exercise tolerated well No report of concerns or symptoms today  Goals Unmet:  Not Applicable  Comments: Pt able to follow exercise prescription today without complaint.  Will continue to monitor for progression.    Dr. Firman Hughes is Medical Director for [facility] Cardiac Rehabilitation.  Dr. Fuad Aleskerov is Medical Director for [facility] Pulmonary Rehabilitation.

## 2023-06-18 ENCOUNTER — Encounter: Payer: Medicare Other | Admitting: *Deleted

## 2023-06-18 DIAGNOSIS — C50919 Malignant neoplasm of unspecified site of unspecified female breast: Secondary | ICD-10-CM

## 2023-06-18 NOTE — Progress Notes (Signed)
 Daily Session Note  Patient Details  Name: KAMALANI AMINI MRN: [id number] Date of Birth: April 07, 1950 Referring Provider:   Gattis Kass Cancer Associated Rehabilitation & Exercise from 03/31/2023 in [facility] Cardiac and Pulmonary Rehab  Referring Provider Seretha Dance, MD       Encounter Date: 06/18/2023  Check In:  Session Check In - 06/18/23 1222       Check-In   Supervising physician immediately available to respond to emergencies See telemetry face sheet for immediately available ER MD    Location [facility]-Cardiac & Pulmonary Rehab    Staff Present Sue Em RN,BSN;Maxon Conetta BS, Exercise Physiologist    Virtual Visit No    Medication changes reported     No    Fall or balance concerns reported    No    Warm-up and Cool-down Performed on first and last piece of equipment    Resistance Training Performed Yes    VAD Patient? No    PAD/SET Patient? No      Pain Assessment   Currently in Pain? No/denies                Social History   Tobacco Use  Smoking Status Former   Current packs/day: 0.00   Average packs/day: 0.5 packs/day for 6.0 years (3.0 ttl pk-yrs)   Types: Cigarettes   Start date: 02/12/1968   Quit date: 02/11/1974   Years since quitting: 49.3  Smokeless Tobacco Never    Goals Met:  Independence with exercise equipment Exercise tolerated well No report of concerns or symptoms today Strength training completed today  Goals Unmet:  Not Applicable  Comments: Pt able to follow exercise prescription today without complaint.  Will continue to monitor for progression.    Dr. Firman Hughes is Medical Director for [facility] Cardiac Rehabilitation.  Dr. Fuad Aleskerov is Medical Director for [facility] Pulmonary Rehabilitation.

## 2023-06-24 NOTE — Progress Notes (Signed)
 Radiation Oncology         [phone] ________________________________  Name: Natalie [name] MRN: [id number]  Date: 06/25/2023  DOB: Dec 25, 1950  Follow-Up Visit Note  CC: Sowles, Krichna, MD  Suzi Essex, MD    ICD-10-CM   1. 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 Jacalyn Martin in 2023   Interval Since Last Radiation: 2 year and 3 months 14 days   02/11/2021 through 03/14/2021 Site Technique Total Dose (Gy) Dose per Fx (Gy) Completed Fx Beam Energies  Vagina: Pelvis HDR-brachy 30/30 6 5/5 Ir-192    Narrative:  The patient returns today for routine follow-up.  She was last seen in office on 12/29/22 for a routine follow up. Patient continued to follow up with their specialists to manage their chronic conditions.   In the interval since she was last seen, she presented for a follow up with Dr. Randy Buttery on 01/20/23 during which she reported tolerating letrozole  along with weekly Fosamax  well without any significant side effects.     Patient also presented for a follow up with Dr. Orvil Bland on 03/19/23 during which she reported doing well overall and was noted NED on exam.   No other significant oncologic interval history since the patient was last seen.    Patient is doing well overall today. She notes chronic urinary urgency, not interfering with her quality of life. She is uninterested in medical management at this time. She denies any abdominal pain, abnormal bloating, vaginal bleeding, or changes in her bowel habits. She is not using dilators regularly.                            Allergies:  is allergic to  sulfamethoxazole-trimethoprim and tnkase  [tenecteplase ].  Meds: Current Outpatient Medications  Medication Sig Dispense Refill   alendronate  (FOSAMAX ) 70 MG tablet TAKE 1 TABLET(70 MG) BY MOUTH 1 TIME A WEEK WITH A FULL GLASS OF WATER  AND ON AN EMPTY STOMACH 12 tablet 0   aspirin  81 MG EC tablet Take by mouth.     aspirin -acetaminophen -caffeine (EXCEDRIN MIGRAINE) 250-250-65 MG tablet Take by mouth every 6 (six) hours as needed for headache or migraine.     atorvastatin  (LIPITOR) 40 MG tablet Take 1 tablet (40 mg total) by mouth daily. 90 tablet 2   buPROPion  (WELLBUTRIN  SR) 150 MG 12 hr tablet Take 1 tablet (150 mg total) by mouth 2 (two) times daily. 180 tablet 2   calcium  carbonate (OSCAL) 1500 (600 Ca) MG TABS tablet Take 600 mg of elemental calcium  by mouth 1 day or 1 dose.     cholecalciferol (VITAMIN D3) 25 MCG (1000 UNIT) tablet Take 1,000 Units by mouth daily.     citalopram  (CELEXA ) 40 MG tablet Take 1 tablet (40 mg total) by mouth daily. 90 tablet 2   Cyanocobalamin  (VITAMIN B-12) 1000 MCG SUBL Place 1 tablet (1,000 mcg total) under the tongue 2 (two) times a week. 30 tablet 0   letrozole  (FEMARA ) 2.5 MG tablet TAKE 1 TABLET(2.5 MG) BY MOUTH DAILY 90 tablet 1   levothyroxine  (SYNTHROID ) 75 MCG tablet Take 1 tablet (75 mcg  total) by mouth daily before breakfast. 90 tablet 2   Multiple Vitamin (MULTIVITAMIN WITH MINERALS) TABS tablet Take 1 tablet by mouth daily.     Ubrogepant  (UBRELVY ) 100 MG TABS Take 100 mg by mouth daily as needed (migraines).     No current facility-administered medications for this encounter.    Physical Findings: The patient is in no acute distress. Patient is alert and oriented.  height is 5\' 5"  (1.651 m) and weight is 159 lb 12.8 oz (72.5 kg). Her temperature is 97.4 F (36.3 C) (abnormal). Her blood pressure is 119/75 and her pulse is 61. Her respiration is 18 and oxygen saturation is 100%. .  No significant changes. Lungs are clear to auscultation  bilaterally. Heart has regular rate and rhythm. No palpable cervical, supraclavicular, or axillary adenopathy. Abdomen soft, non-tender, normal bowel sounds.  On pelvic examination the external genitalia were unremarkable.  Speculum examination the vaginal vault is narrowed but permits small speculum.  Some radiation changes noted at the vaginal cuff.  No significant agglutination.  On bimanual and rectovaginal examination no pelvic masses appreciated.  No nodularity along the cuff.   Lab Findings: Lab Results  Component Value Date   WBC 3.2 (L) 05/18/2023   HGB 12.8 05/18/2023   HCT 39.3 05/18/2023   MCV 98.3 05/18/2023   PLT 171 05/18/2023    Radiographic Findings: No results found.  Impression: Stage 1A grade 1 endometrioid endometrial adenocarcinoma; s/p total hysterectomy, BSO and nodal biopsies, and adjuvant brachytherapy completed on 03/14/2021  The patient is doing well with no evidence of disease recurrence on clinical exam today.    Plan:  Patient is now >2 years from her final treatment. We will progress to 28-month follow-up intervals. We will schedule the patient to follow up with Dr. Orvil Bland in 6 months and radiation oncology in 12 months. Patient was educated on signs/symptoms that may indicate disease recurrence and understands to notify us  if she experiences any of them.     30 minutes of total time was spent for this patient encounter, including preparation, face-to-face counseling with the patient and coordination of care, physical exam, and documentation of the encounter. ____________________________________   Julio Ohm, PA-C   Noralee Beam, PhD, MD   [facility]  Radiation Oncology Direct Dial: [phone]  Fax: [phone] [email or web address]    This document serves as a record of services personally performed by Retta Caster, MD and Julio Ohm, PA-C. It was created on his behalf by Lucky Sable, a trained medical scribe. The creation of this record is  based on the scribe's personal observations and the provider's statements to them. This document has been checked and approved by the attending provider.

## 2023-06-25 ENCOUNTER — Ambulatory Visit
Admission: RE | Admit: 2023-06-25 | Discharge: 2023-06-25 | Disposition: A | Discharge status: Home / Self Care | Source: Ambulatory Visit | Attending: Radiation Oncology | Admitting: Radiation Oncology

## 2023-06-25 ENCOUNTER — Encounter: Payer: Self-pay | Admitting: Radiation Oncology

## 2023-06-25 VITALS — BP 119/75 | HR 61 | Temp 97.4°F | Resp 18 | Ht 65.0 in | Wt 159.8 lb

## 2023-06-25 DIAGNOSIS — C541 Malignant neoplasm of endometrium: Secondary | ICD-10-CM

## 2023-06-25 DIAGNOSIS — Z79890 Hormone replacement therapy (postmenopausal): Secondary | ICD-10-CM | POA: Diagnosis not present

## 2023-06-25 DIAGNOSIS — Z79899 Other long term (current) drug therapy: Secondary | ICD-10-CM | POA: Insufficient documentation

## 2023-06-25 DIAGNOSIS — Z923 Personal history of irradiation: Secondary | ICD-10-CM | POA: Insufficient documentation

## 2023-06-25 DIAGNOSIS — Z8542 Personal history of malignant neoplasm of other parts of uterus: Secondary | ICD-10-CM | POA: Insufficient documentation

## 2023-06-25 DIAGNOSIS — Z170 Estrogen receptor positive status [ER+]: Secondary | ICD-10-CM | POA: Insufficient documentation

## 2023-06-25 DIAGNOSIS — Z1721 Progesterone receptor positive status: Secondary | ICD-10-CM | POA: Diagnosis not present

## 2023-06-25 DIAGNOSIS — C50919 Malignant neoplasm of unspecified site of unspecified female breast: Secondary | ICD-10-CM

## 2023-06-25 DIAGNOSIS — Z79811 Long term (current) use of aromatase inhibitors: Secondary | ICD-10-CM | POA: Diagnosis not present

## 2023-06-25 DIAGNOSIS — Z1732 Human epidermal growth factor receptor 2 negative status: Secondary | ICD-10-CM | POA: Diagnosis not present

## 2023-06-25 NOTE — Progress Notes (Signed)
 Daily Session Note  Patient Details  Name: Natalie [name] MRN: [id number] Date of Birth: 1950-02-22 Referring Provider:   Gattis Kass Cancer Associated Rehabilitation & Exercise from 03/31/2023 in [facility] Cardiac and Pulmonary Rehab  Referring Provider Seretha Dance, MD       Encounter Date: 06/25/2023  Check In:  Session Check In - 06/25/23 1255       Check-In   Supervising physician immediately available to respond to emergencies See telemetry face sheet for immediately available ER MD    Location [facility]-Cardiac & Pulmonary Rehab    Staff Present Lyell Samuel, MS, Exercise Physiologist;Maxon Conetta BS, Exercise Physiologist    Virtual Visit No    Medication changes reported     No    Fall or balance concerns reported    No    Warm-up and Cool-down Performed on first and last piece of equipment    Resistance Training Performed Yes    VAD Patient? No    PAD/SET Patient? No      Pain Assessment   Currently in Pain? No/denies    Multiple Pain Sites No                Social History   Tobacco Use  Smoking Status Former   Current packs/day: 0.00   Average packs/day: 0.5 packs/day for 6.0 years (3.0 ttl pk-yrs)   Types: Cigarettes   Start date: 02/12/1968   Quit date: 02/11/1974   Years since quitting: 49.4  Smokeless Tobacco Never    Goals Met:  Independence with exercise equipment Exercise tolerated well No report of concerns or symptoms today  Goals Unmet:  Not Applicable  Comments: Pt able to follow exercise prescription today without complaint.  Will continue to monitor for progression.    Dr. Firman Hughes is Medical Director for [facility] Cardiac Rehabilitation.  Dr. Fuad Aleskerov is Medical Director for [facility] Pulmonary Rehabilitation.

## 2023-06-25 NOTE — Progress Notes (Signed)
 [name] is here today for follow up post radiation to the pelvic.  They completed their radiation on: 11/12/21   Does the patient complain of any of the following:  Pain: No Abdominal bloating:  No Diarrhea/Constipation:  No Nausea/Vomiting: No Vaginal Discharge: No Blood in Urine or Stool: No Urinary Issues (dysuria/incomplete emptying/ incontinence/ increased frequency/urgency): Urinary urgency.  Does patient report using vaginal dilator 2-3 times a week and/or sexually active 2-3 weeks: No Post radiation skin changes: No   Additional comments if applicable:   BP 119/75 (BP Location: Right Arm, Patient Position: Sitting, Cuff Size: Normal)   Pulse 61   Temp (!) 97.4 F (36.3 C)   Resp 18   Ht 5\' 5"  (1.651 m)   Wt 159 lb 12.8 oz (72.5 kg)   SpO2 100%   BMI 26.59 kg/m

## 2023-06-26 ENCOUNTER — Other Ambulatory Visit: Payer: Self-pay | Admitting: Family Medicine

## 2023-06-26 DIAGNOSIS — E039 Hypothyroidism, unspecified: Secondary | ICD-10-CM

## 2023-06-26 DIAGNOSIS — F339 Major depressive disorder, recurrent, unspecified: Secondary | ICD-10-CM

## 2023-06-26 DIAGNOSIS — Z8673 Personal history of transient ischemic attack (TIA), and cerebral infarction without residual deficits: Secondary | ICD-10-CM

## 2023-06-26 DIAGNOSIS — E785 Hyperlipidemia, unspecified: Secondary | ICD-10-CM

## 2023-07-02 ENCOUNTER — Encounter: Payer: Self-pay | Admitting: Radiation Oncology

## 2023-07-02 ENCOUNTER — Ambulatory Visit: Payer: Self-pay | Admitting: Radiation Oncology

## 2023-07-02 ENCOUNTER — Ambulatory Visit
Admission: RE | Admit: 2023-07-02 | Discharge: 2023-07-02 | Disposition: A | Discharge status: Home / Self Care | Payer: Medicare Other | Source: Ambulatory Visit | Attending: Radiation Oncology | Admitting: Radiation Oncology

## 2023-07-02 VITALS — BP 124/82 | HR 65 | Temp 97.4°F | Resp 16 | Wt 157.0 lb

## 2023-07-02 DIAGNOSIS — R42 Dizziness and giddiness: Secondary | ICD-10-CM | POA: Insufficient documentation

## 2023-07-02 DIAGNOSIS — C50919 Malignant neoplasm of unspecified site of unspecified female breast: Secondary | ICD-10-CM

## 2023-07-02 DIAGNOSIS — I6782 Cerebral ischemia: Secondary | ICD-10-CM | POA: Insufficient documentation

## 2023-07-02 DIAGNOSIS — C50912 Malignant neoplasm of unspecified site of left female breast: Secondary | ICD-10-CM | POA: Insufficient documentation

## 2023-07-02 DIAGNOSIS — Z8542 Personal history of malignant neoplasm of other parts of uterus: Secondary | ICD-10-CM | POA: Insufficient documentation

## 2023-07-02 DIAGNOSIS — Z79811 Long term (current) use of aromatase inhibitors: Secondary | ICD-10-CM | POA: Insufficient documentation

## 2023-07-02 DIAGNOSIS — Z170 Estrogen receptor positive status [ER+]: Secondary | ICD-10-CM | POA: Insufficient documentation

## 2023-07-02 DIAGNOSIS — Z923 Personal history of irradiation: Secondary | ICD-10-CM | POA: Insufficient documentation

## 2023-07-02 NOTE — Progress Notes (Signed)
 Daily Session Note  Patient Details  Name: Natalie [name] MRN: [id number] Date of Birth: Nov 08, 1950 Referring Provider:   Gattis Kass Cancer Associated Rehabilitation & Exercise from 03/31/2023 in [facility] Cardiac and Pulmonary Rehab  Referring Provider Seretha Dance, MD       Encounter Date: 07/02/2023  Check In:  Session Check In - 07/02/23 1250       Check-In   Supervising physician immediately available to respond to emergencies See telemetry face sheet for immediately available ER MD    Location [facility]-Cardiac & Pulmonary Rehab    Staff Present Lyell Samuel, MS, Exercise Physiologist;Maxon Conetta BS, Exercise Physiologist    Virtual Visit No    Medication changes reported     No    Fall or balance concerns reported    No    Warm-up and Cool-down Performed on first and last piece of equipment    Resistance Training Performed Yes    VAD Patient? No    PAD/SET Patient? No      Pain Assessment   Currently in Pain? No/denies    Multiple Pain Sites No                Social History   Tobacco Use  Smoking Status Former   Current packs/day: 0.00   Average packs/day: 0.5 packs/day for 6.0 years (3.0 ttl pk-yrs)   Types: Cigarettes   Start date: 02/12/1968   Quit date: 02/11/1974   Years since quitting: 49.4  Smokeless Tobacco Never    Goals Met:  Independence with exercise equipment Exercise tolerated well No report of concerns or symptoms today  Goals Unmet:  Not Applicable  Comments: Pt able to follow exercise prescription today without complaint.  Will continue to monitor for progression.    Dr. Firman Hughes is Medical Director for [facility] Cardiac Rehabilitation.  Dr. Fuad Aleskerov is Medical Director for [facility] Pulmonary Rehabilitation.

## 2023-07-02 NOTE — Progress Notes (Signed)
 Radiation Oncology Follow up Note  Name: Natalie [name]   Date:   07/02/2023 MRN:  [id number] DOB: August 18, 1950    This 73 y.o. adult presents to the clinic today for 20-month follow-up status post whole breast radiation to her left breast for stage Ia ER positive invasive mammary carcinoma.  REFERRING PROVIDER: Sowles, Krichna, MD  HPI: Patient is a 73 year old female now out 19 months having completed whole breast radiation to her left breast for stage I-invasive mammary carcinoma.  Seen today in routine follow-up she is doing well specifically denies breast tenderness cough or bone pain.  She also had vaginal brachytherapy at [facility] long.  Over 2 years prior with no evidence of recurrent disease is being followed by Dr. Ewing Holiday there.  She is currently on letrozole  tolerating it well without side effect.  She had mammograms last June which I reviewed were BI-RADS 2 benign she is scheduled for repeat mammograms this June.  She also had last August a CT scan of her head for dizziness showing generalized cerebral atrophy with chronic white matter small vessel ischemic changes no acute intracranial abnormality was noted.  COMPLICATIONS OF TREATMENT: none  FOLLOW UP COMPLIANCE: keeps appointments   PHYSICAL EXAM:  BP 124/82   Pulse 65   Temp (!) 97.4 F (36.3 C)   Resp 16   Wt 157 lb (71.2 kg)   BMI 26.13 kg/m  Lungs are clear to A&P cardiac examination essentially unremarkable with regular rate and rhythm. No dominant mass or nodularity is noted in either breast in 2 positions examined. Incision is well-healed. No axillary or supraclavicular adenopathy is appreciated. Cosmetic result is excellent.  Well-developed well-nourished patient in NAD. HEENT reveals PERLA, EOMI, discs not visualized.  Oral cavity is clear. No oral mucosal lesions are identified. Neck is clear without evidence of cervical or supraclavicular adenopathy. Lungs are clear to A&P. Cardiac examination is essentially  unremarkable with regular rate and rhythm without murmur rub or thrill. Abdomen is benign with no organomegaly or masses noted. Motor sensory and DTR levels are equal and symmetric in the upper and lower extremities. Cranial nerves II through XII are grossly intact. Proprioception is intact. No peripheral adenopathy or edema is identified. No motor or sensory levels are noted. Crude visual fields are within normal range.  RADIOLOGY RESULTS: Mammograms and CT scan head reviewed compatible with above-stated findings  PLAN: Present time patient is doing well now out 19 months from whole breast radiation with no evidence of disease.  I am pleased with her overall progress.  Of asked to see her back in 1 year for follow-up after her next mammograms.  Patient knows to call with any concerns.  I would like to take this opportunity to thank you for allowing me to participate in the care of your patient.Glenis Langdon, MD

## 2023-07-04 ENCOUNTER — Other Ambulatory Visit: Payer: Self-pay | Admitting: Family Medicine

## 2023-07-04 DIAGNOSIS — F339 Major depressive disorder, recurrent, unspecified: Secondary | ICD-10-CM

## 2023-07-09 ENCOUNTER — Encounter: Payer: Medicare Other | Attending: Oncology

## 2023-07-09 DIAGNOSIS — C50919 Malignant neoplasm of unspecified site of unspecified female breast: Secondary | ICD-10-CM | POA: Insufficient documentation

## 2023-07-09 NOTE — Progress Notes (Signed)
 Daily Session Note  Patient Details  Name: LEILANIE RAUDA MRN: [id number] Date of Birth: 1950/10/13 Referring Provider:   Gattis Kass Cancer Associated Rehabilitation & Exercise from 03/31/2023 in [facility] Cardiac and Pulmonary Rehab  Referring Provider Seretha Dance, MD       Encounter Date: 07/09/2023  Check In:  Session Check In - 07/09/23 1342       Check-In   Supervising physician immediately available to respond to emergencies See telemetry face sheet for immediately available ER MD    Location [facility]-Cardiac & Pulmonary Rehab    Staff Present Lyell Samuel, MS, Exercise Physiologist;Maxon Conetta BS, Exercise Physiologist    Virtual Visit No    Medication changes reported     No    Fall or balance concerns reported    No    Warm-up and Cool-down Performed on first and last piece of equipment    Resistance Training Performed Yes    VAD Patient? No    PAD/SET Patient? No      Pain Assessment   Currently in Pain? No/denies    Multiple Pain Sites No                Social History   Tobacco Use  Smoking Status Former   Current packs/day: 0.00   Average packs/day: 0.5 packs/day for 6.0 years (3.0 ttl pk-yrs)   Types: Cigarettes   Start date: 02/12/1968   Quit date: 02/11/1974   Years since quitting: 49.4  Smokeless Tobacco Never    Goals Met:  Independence with exercise equipment Exercise tolerated well No report of concerns or symptoms today  Goals Unmet:  Not Applicable  Comments: Pt able to follow exercise prescription today without complaint.  Will continue to monitor for progression.    Dr. Firman Hughes is Medical Director for [facility] Cardiac Rehabilitation.  Dr. Fuad Aleskerov is Medical Director for [facility] Pulmonary Rehabilitation.

## 2023-07-14 ENCOUNTER — Encounter: Payer: Medicare Other | Admitting: *Deleted

## 2023-07-14 DIAGNOSIS — C50919 Malignant neoplasm of unspecified site of unspecified female breast: Secondary | ICD-10-CM

## 2023-07-14 NOTE — Progress Notes (Signed)
 Daily Session Note  Patient Details  Name: Natalie [name] MRN: [id number] Date of Birth: [birth year] Referring Provider:   Gattis Kass Cancer Associated Rehabilitation & Exercise from 03/31/2023 in [facility] Cardiac and Pulmonary Rehab  Referring Provider Seretha Dance, MD       Encounter Date: 07/14/2023  Check In:  Session Check In - 07/14/23 1232       Check-In   Supervising physician immediately available to respond to emergencies See telemetry face sheet for immediately available ER MD    Location [facility]-Cardiac & Pulmonary Rehab    Staff Present Sue Em RN,BSN;Noah Tickle, BS, Exercise Physiologist    Virtual Visit No    Medication changes reported     No    Fall or balance concerns reported    No    Warm-up and Cool-down Performed on first and last piece of equipment    Resistance Training Performed Yes    VAD Patient? No    PAD/SET Patient? No      Pain Assessment   Currently in Pain? No/denies                Social History   Tobacco Use  Smoking Status Former   Current packs/day: 0.00   Average packs/day: 0.5 packs/day for 6.0 years (3.0 ttl pk-yrs)   Types: Cigarettes   Start date: 02/12/1968   Quit date: 02/11/1974   Years since quitting: 49.4  Smokeless Tobacco Never    Goals Met:  Independence with exercise equipment Exercise tolerated well No report of concerns or symptoms today Strength training completed today  Goals Unmet:  Not Applicable  Comments: Pt able to follow exercise prescription today without complaint.  Will continue to monitor for progression.    Dr. Firman Hughes is Medical Director for [facility] Cardiac Rehabilitation.  Dr. Fuad Aleskerov is Medical Director for [facility] Pulmonary Rehabilitation.

## 2023-07-15 ENCOUNTER — Encounter: Payer: Self-pay | Admitting: Oncology

## 2023-07-15 ENCOUNTER — Inpatient Hospital Stay: Attending: Oncology | Admitting: Oncology

## 2023-07-15 VITALS — BP 102/72 | HR 63 | Temp 98.1°F | Resp 18 | Ht 65.0 in | Wt 154.9 lb

## 2023-07-15 DIAGNOSIS — Z79811 Long term (current) use of aromatase inhibitors: Secondary | ICD-10-CM | POA: Diagnosis not present

## 2023-07-15 DIAGNOSIS — Z170 Estrogen receptor positive status [ER+]: Secondary | ICD-10-CM | POA: Diagnosis not present

## 2023-07-15 DIAGNOSIS — Z87891 Personal history of nicotine dependence: Secondary | ICD-10-CM

## 2023-07-15 DIAGNOSIS — C50912 Malignant neoplasm of unspecified site of left female breast: Secondary | ICD-10-CM | POA: Diagnosis not present

## 2023-07-15 DIAGNOSIS — Z08 Encounter for follow-up examination after completed treatment for malignant neoplasm: Secondary | ICD-10-CM

## 2023-07-15 DIAGNOSIS — C50212 Malignant neoplasm of upper-inner quadrant of left female breast: Secondary | ICD-10-CM | POA: Diagnosis not present

## 2023-07-15 DIAGNOSIS — Z79899 Other long term (current) drug therapy: Secondary | ICD-10-CM

## 2023-07-15 DIAGNOSIS — Z8542 Personal history of malignant neoplasm of other parts of uterus: Secondary | ICD-10-CM | POA: Diagnosis not present

## 2023-07-15 NOTE — Progress Notes (Signed)
 Patient reports she is doing pretty good & has some balance issues at times, but overall doing good. No concerns at this time.

## 2023-07-16 ENCOUNTER — Ambulatory Visit
Admission: RE | Admit: 2023-07-16 | Discharge: 2023-07-16 | Disposition: A | Discharge status: Home / Self Care | Payer: Medicare Other | Source: Ambulatory Visit | Attending: Oncology | Admitting: Oncology

## 2023-07-16 DIAGNOSIS — M810 Age-related osteoporosis without current pathological fracture: Secondary | ICD-10-CM | POA: Insufficient documentation

## 2023-07-16 DIAGNOSIS — Z08 Encounter for follow-up examination after completed treatment for malignant neoplasm: Secondary | ICD-10-CM | POA: Diagnosis not present

## 2023-07-16 DIAGNOSIS — R928 Other abnormal and inconclusive findings on diagnostic imaging of breast: Secondary | ICD-10-CM | POA: Diagnosis not present

## 2023-07-16 DIAGNOSIS — Z853 Personal history of malignant neoplasm of breast: Secondary | ICD-10-CM | POA: Diagnosis not present

## 2023-07-16 DIAGNOSIS — Z79811 Long term (current) use of aromatase inhibitors: Secondary | ICD-10-CM | POA: Diagnosis not present

## 2023-07-16 DIAGNOSIS — R92323 Mammographic fibroglandular density, bilateral breasts: Secondary | ICD-10-CM | POA: Diagnosis not present

## 2023-07-16 DIAGNOSIS — Z780 Asymptomatic menopausal state: Secondary | ICD-10-CM | POA: Diagnosis not present

## 2023-07-16 NOTE — Progress Notes (Signed)
 Hematology/Oncology Consult note [facility]  [phone] [phone]  Patient Care Team: Sowles, Krichna, MD as PCP - General (Family Medicine) Retta Caster, MD as Consulting Physician (Radiation Oncology) Rosan Comfort, MD as Consulting Physician (Neurology) Antonette Batters, MD as Consulting Physician (Cardiology) Suzi Essex, MD as Consulting Physician (Gynecologic Oncology) Avonne Boettcher, MD as Consulting Physician (Oncology)   Name of the patient: Natalie [name]  [id number]  September 27, 1950   Date of visit: 07/16/23  Diagnosis- pathological prognostic 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  Diagnostic mammogram and ultrasound showed indeterminate left breast asymmetry as well as a probable benign right-sided breast mass.  Biopsy of both the masses was recommended.  However the second mass resolved at the time of biopsy likely consistent with fat necrosis from anticoagulate use.  Left breast mass biopsy showed invasive mammary carcinoma grade 1 ER greater than 90% positive, PR negative and HER2 negative.   Personal history of endometrial cancer in November 2022 s/p surgery and adjuvant radiation therapy.  Also had a posterior circulation stroke in January 2022 for which she is on dual antiplatelet therapy.   Final pathology showed a 5 mm tubular carcinoma grade 1 ER greater than 90% positive PR 10% positive and HER2 negative.  Negative margins.  3 sentinel lymph nodes negative for malignancy.  PT1AN0.  Patient has baseline osteoporosis.  Tamoxifen was not started given her history of TIAs and patient started taking letrozole  in  March 2024.      Interval history-patient is tolerating letrozole  well.  She is on weekly Fosamax  for her osteoporosis.  She is also compliant with her calciumAnd vitamin D .  Denies any breast concerns today  ECOG PS- 1 Pain scale- 0   Review of systems- Review of Systems  Constitutional:  Negative for chills, fever, malaise/fatigue and weight loss.  HENT:  Negative for congestion, ear discharge and nosebleeds.   Eyes:  Negative for blurred vision.  Respiratory:  Negative for cough, hemoptysis, sputum production, shortness of breath and wheezing.   Cardiovascular:  Negative for chest pain, palpitations, orthopnea and claudication.  Gastrointestinal:  Negative for abdominal pain, blood in stool, constipation, diarrhea, heartburn, melena, nausea and vomiting.  Genitourinary:  Negative for dysuria, flank pain, frequency, hematuria and urgency.  Musculoskeletal:  Negative for back pain, joint pain and myalgias.  Skin:  Negative for rash.  Neurological:  Negative for dizziness, tingling, focal weakness, seizures, weakness and headaches.  Endo/Heme/Allergies:  Does not bruise/bleed easily.  Psychiatric/Behavioral:  Negative for depression and suicidal ideas. The patient does not have insomnia.       Allergies  Allergen Reactions   Sulfamethoxazole-Trimethoprim Hives   Tnkase  [Tenecteplase ] Swelling    Mild focal left upper lip swelling     Past Medical History:  Diagnosis Date   Acute bronchospasm due to viral infection 2020   Due to pollen   Acute ischemic left MCA stroke (HCC) 04/19/2020   a.) CTA head/neck 04/19/2020 --> nearly occlusive thrombus at bifurcation of a proximal M2 MCA branch approximately 6 mm from the MCA bifurcation. Subsequent occlusion of a proximal left M3 MCA branch   Allergy    Anemia  Angina at rest [facility])    Breast cancer, left (HCC) 08/21/2021   a.) stereotactic Bx 08/21/2021 --> IMC (G1, ER/PR +, Her2/neu -)   Cataract    a.) s/p extraction with  IOL placement   Depressive disorder    Diverticulosis    Dyspnea    Epistaxis    Fatigue    Hearing loss    History of 2019 novel coronavirus disease (COVID-19) 12/18/2020   History of ischemic multifocal posterior circulation stroke 02/28/2020   a.) presented to ED with increasing episodes of  transient vertical binocular diplopia; MRI 02/27/2021 --> multiple punctate acute to subacute ischemic infarcts involving the cortical/subcortical aspects of both parieto-occipital regions   History of loop recorder    History of radiation therapy    Vagina-02/11/21-03/14/21- Dr. Retta Caster   HLD (hyperlipidemia)    Hypothyroidism    LAFB (left anterior fascicular block)    Long term current use of antithrombotics/antiplatelets    a.) on DAPT therapy (ASA + clopidogrel )   Memory change    Migraine    Osteoarthritis    Osteoporosis    Palpitations    Personal history of radiation therapy    Premature menopause    QT prolongation    Seizures (HCC) 1994   history of mini seizures, possible migraine induced   Stage I adenocarcinoma of endometrium (HCC) 12/18/2020   a.) stage 1A; grade 1 (pT1a, pN0); b.) s/p TLH/BSO 12/18/2020 + adjuvant vaginal brachytherapy (30 Gy over 5 fractions between 02/11/2021 - 03/14/2021)   Vitamin D  deficiency      Past Surgical History:  Procedure Laterality Date   ABDOMINAL HYSTERECTOMY  12/18/2020   ABLATION  2006   AXILLARY SENTINEL NODE BIOPSY Left 09/25/2021   Procedure: AXILLARY SENTINEL NODE BIOPSY;  Surgeon: Marshall Skeeter, MD;  Location: [facility] ORS;  Service: General;  Laterality: Left;   BREAST BIOPSY Left 08/21/2021   Stereo Bx, X-clip; path --> IMC (G1, ER/PR +, Her2/neu -)   BREAST LUMPECTOMY Left 09/25/2021   BREAST LUMPECTOMY WITH NEEDLE LOCALIZATION Left 09/25/2021   Procedure: BREAST LUMPECTOMY WITH NEEDLE LOCALIZATION;  Surgeon: Marshall Skeeter, MD;  Location: [facility] ORS;  Service: General;  Laterality: Left;   BUBBLE STUDY  04/23/2020    Procedure: BUBBLE STUDY;  Surgeon: Darlis Eisenmenger, MD;  Location: [facility] ENDOSCOPY;  Service: Cardiovascular;;   CATARACT EXTRACTION W/ INTRAOCULAR LENS IMPLANT Bilateral    COLONOSCOPY     COLONOSCOPY WITH PROPOFOL  N/A 12/22/2018   Procedure: COLONOSCOPY WITH PROPOFOL ;  Surgeon: Irby Mannan, MD;  Location: [facility] ENDOSCOPY;  Service: Endoscopy;  Laterality: N/A;   COLONOSCOPY WITH PROPOFOL  N/A 04/16/2023   Procedure: COLONOSCOPY WITH PROPOFOL ;  Surgeon: Selena Daily, MD;  Location: [facility] ENDOSCOPY;  Service: Gastroenterology;  Laterality: N/A;   EYE SURGERY  Spring 2018   Cataracts both eyes   HYSTEROSCOPY WITH D & C N/A 11/01/2020   Procedure: DILATATION AND CURETTAGE /HYSTEROSCOPY WITH MYOSURE;  Surgeon: Suzi Essex, MD;  Location: [facility] ORS;  Service: Gynecology;  Laterality: N/A;   LAPAROTOMY  12/18/2020   Procedure: LAPAROTOMY;  Surgeon: Suzi Essex, MD;  Location: [facility] ORS;  Service: Gynecology;;   LOOP RECORDER INSERTION N/A 04/23/2020   Procedure: LOOP RECORDER INSERTION;  Surgeon: Tammie Fall, MD;  Location: [facility] INVASIVE CV LAB;  Service: Cardiovascular;  Laterality: N/A;   ROBOTIC ASSISTED TOTAL HYSTERECTOMY WITH BILATERAL SALPINGO OOPHERECTOMY Bilateral 12/18/2020   Procedure: XI ROBOTIC ASSISTED TOTAL HYSTERECTOMY WITH BILATERAL SALPINGO OOPHORECTOMY;  Surgeon:  Suzi Essex, MD;  Location: [facility] ORS;  Service: Gynecology;  Laterality: Bilateral;   SENTINEL NODE BIOPSY N/A 12/18/2020   Procedure: SENTINEL NODE BIOPSY;  Surgeon: Suzi Essex, MD;  Location: [facility] ORS;  Service: Gynecology;  Laterality: N/A;   TEE WITHOUT CARDIOVERSION N/A 04/23/2020   Procedure: TRANSESOPHAGEAL ECHOCARDIOGRAM (TEE);  Surgeon: Darlis Eisenmenger, MD;  Location: [facility] ENDOSCOPY;  Service: Cardiovascular;  Laterality: N/A;   TONSILLECTOMY      Social History   Socioeconomic History   Marital status: Single    Spouse name: Not on file   Number of children: 1   Years  of education: Not on file   Highest education level: Professional school degree (e.g., MD, DDS, DVM, JD)  Occupational History   Occupation: retired   Tobacco Use   Smoking status: Former    Current packs/day: 0.00    Average packs/day: 0.5 packs/day for 6.0 years (3.0 ttl pk-yrs)    Types: Cigarettes    Start date: 02/12/1968    Quit date: 02/11/1974    Years since quitting: 49.4   Smokeless tobacco: Never  Vaping Use   Vaping status: Never Used  Substance and Sexual Activity   Alcohol use: Yes    Comment: rare   Drug use: Never   Sexual activity: Not Currently    Comment: Don't use not sexually active  Other Topics Concern   Not on file  Social History Narrative   Raised an adopted child on her own   Working part time reviewed documents   Social Drivers of Corporate investment banker Strain: Low Risk  (04/09/2023)   Overall Financial Resource Strain (CARDIA)    Difficulty of Paying Living Expenses: Not hard at all  Food Insecurity: No Food Insecurity (04/09/2023)   Hunger Vital Sign    Worried About Running Out of Food in the Last Year: Never true    Ran Out of Food in the Last Year: Never true  Transportation Needs: No Transportation Needs (04/09/2023)   PRAPARE - Administrator, Civil Service (Medical): No    Lack of Transportation (Non-Medical): No  Physical Activity: Sufficiently Active (04/09/2023)   Exercise Vital Sign    Days of Exercise per Week: 3 days    Minutes of Exercise per Session: 50 min  Stress: No Stress Concern Present (04/09/2023)   [employer] of Occupational Health - Occupational Stress Questionnaire    Feeling of Stress : Not at all  Social Connections: Moderately Integrated (04/09/2023)   Social Connection and Isolation Panel    Frequency of Communication with Friends and Family: More than three times a week    Frequency of Social Gatherings with Friends and Family: More than three times a week    Attends Religious Services: More than 4  times per year    Active Member of [employer] or Organizations: Yes    Attends Banker Meetings: More than 4 times per year    Marital Status: Never married  Intimate Partner Violence: Not At Risk (04/09/2023)   Humiliation, Afraid, Rape, and Kick questionnaire    Fear of Current or Ex-Partner: No    Emotionally Abused: No    Physically Abused: No    Sexually Abused: No    Family History  Problem Relation Age of Onset   Early death Father        suicide   Depression Father    Diabetes Father    Cancer Father  prostate   Hearing loss Father        due to war   Alzheimer's disease Mother    Heart disease Brother    Atrial fibrillation Brother    Heart disease Maternal Aunt    Dementia Maternal Aunt    Heart disease Maternal Uncle    Dementia Maternal Grandmother    Heart disease Brother    Atrial fibrillation Brother    Colon cancer Neg Hx    Breast cancer Neg Hx    Ovarian cancer Neg Hx    Pancreatic cancer Neg Hx    Endometrial cancer Neg Hx      Current Outpatient Medications:    alendronate  (FOSAMAX ) 70 MG tablet, TAKE 1 TABLET(70 MG) BY MOUTH 1 TIME A WEEK WITH A FULL GLASS OF WATER  AND ON AN EMPTY STOMACH, Disp: 12 tablet, Rfl: 0   aspirin  81 MG EC tablet, Take by mouth., Disp: , Rfl:    aspirin -acetaminophen -caffeine (EXCEDRIN MIGRAINE) 250-250-65 MG tablet, Take by mouth every 6 (six) hours as needed for headache or migraine., Disp: , Rfl:    atorvastatin  (LIPITOR) 40 MG tablet, TAKE 1 TABLET(40 MG) BY MOUTH DAILY, Disp: 90 tablet, Rfl: 1   buPROPion  (WELLBUTRIN  SR) 150 MG 12 hr tablet, TAKE 1 TABLET(150 MG) BY MOUTH TWICE DAILY, Disp: 180 tablet, Rfl: 1   calcium  carbonate (OSCAL) 1500 (600 Ca) MG TABS tablet, Take 600 mg of elemental calcium  by mouth 1 day or 1 dose., Disp: , Rfl:    cholecalciferol (VITAMIN D3) 25 MCG (1000 UNIT) tablet, Take 1,000 Units by mouth daily., Disp: , Rfl:    citalopram  (CELEXA ) 40 MG tablet, TAKE 1 TABLET(40 MG) BY MOUTH  DAILY, Disp: 90 tablet, Rfl: 1   Cyanocobalamin  (VITAMIN B-12) 1000 MCG SUBL, Place 1 tablet (1,000 mcg total) under the tongue 2 (two) times a week., Disp: 30 tablet, Rfl: 0   letrozole  (FEMARA ) 2.5 MG tablet, TAKE 1 TABLET(2.5 MG) BY MOUTH DAILY, Disp: 90 tablet, Rfl: 1   levothyroxine  (SYNTHROID ) 75 MCG tablet, TAKE 1 TABLET(75 MCG) BY MOUTH DAILY BEFORE BREAKFAST, Disp: 90 tablet, Rfl: 1   Multiple Vitamin (MULTIVITAMIN WITH MINERALS) TABS tablet, Take 1 tablet by mouth daily., Disp: , Rfl:    Ubrogepant  (UBRELVY ) 100 MG TABS, Take 100 mg by mouth daily as needed (migraines)., Disp: , Rfl:   Physical exam:  Vitals:   07/15/23 1408  BP: 102/72  Pulse: 63  Resp: 18  Temp: 98.1 F (36.7 C)  TempSrc: Tympanic  SpO2: 96%  Weight: 154 lb 14.4 oz (70.3 kg)  Height: 5' 5 (1.651 m)   Physical Exam  Cardiovascular:     Rate and Rhythm: Normal rate and regular rhythm.     Heart sounds: Normal heart sounds.  Pulmonary:     Effort: Pulmonary effort is normal.     Breath sounds: Normal breath sounds.  Abdominal:     General: Bowel sounds are normal.     Palpations: Abdomen is soft.   Skin:    General: Skin is warm and dry.   Neurological:     Mental Status: She is alert and oriented to person, place, and time.    Breast exam was performed in seated and lying down position. Patient is status post left lumpectomy with a well-healed surgical scar. No evidence of any palpable masses. No evidence of axillary adenopathy. No evidence of any palpable masses or lumps in the right breast. No evidence of right axillary adenopathy   I  have personally reviewed labs listed below:    Latest Ref Rng & Units 04/13/2023   11:20 AM  CMP  Glucose 65 - 99 mg/dL 87   BUN 7 - 25 mg/dL 16   Creatinine 1.61 - 1.00 mg/dL 0.96   Sodium 045 - 409 mmol/L 137   Potassium 3.5 - 5.3 mmol/L 4.1   Chloride 98 - 110 mmol/L 100   CO2 20 - 32 mmol/L 30   Calcium  8.6 - 10.4 mg/dL 9.5   Total Protein 6.1 - 8.1  g/dL 6.9   Total Bilirubin 0.2 - 1.2 mg/dL 0.8   AST 10 - 35 U/L 22   ALT 6 - 29 U/L 19       Latest Ref Rng & Units 05/18/2023    4:00 PM  CBC  WBC 3.8 - 10.8 Thousand/uL 3.2   Hemoglobin 11.7 - 15.5 g/dL 81.1   Hematocrit 91.4 - 45.0 % 39.3   Platelets 140 - 400 Thousand/uL 171    I have personally reviewed Radiology images listed below: No images are attached to the encounter.  DG Bone Density Result Date: 07/16/2023 EXAM: DUAL X-RAY ABSORPTIOMETRY (DXA) FOR BONE MINERAL DENSITY 07/16/2023 11:01 am CLINICAL DATA:  73 year old Female Postmenopausal. High risk of osteoporosis Patient is or has been on bone building therapies. TECHNIQUE: An axial (e.g., hips, spine) and/or appendicular (e.g., radius) exam was performed, as appropriate, using GE Secretary/administrator at [facility]. Images are obtained for bone mineral density measurement and are not obtained for diagnostic purposes. [id number] Exclusions: Lumbar spine due to advanced degenerative changes. COMPARISON:  07/10/2021. FINDINGS: Scan quality: Good. LEFT FEMORAL NECK: BMD (in g/cm2): 0.646 T-score: -2.8 Z-score: -1.0 LEFT TOTAL HIP: BMD (in g/cm2): 0.663 T-score: -2.7 Z-score: -1.1 RIGHT FEMORAL NECK: BMD (in g/cm2): 0.607 T-score: -3.1 Z-score: -1.3 Rate of change from previous exam: No significant rate of change from previous exam. RIGHT TOTAL HIP: BMD (in g/cm2): 0.626 T-score: -3.0 Z-score: -1.4 DUAL-FEMUR TOTAL MEAN: Rate of change from previous exam: No significant rate of change from previous exam. LEFT FOREARM (RADIUS 33%): BMD (in g/cm2): 0.940 T-score: 0.7 Z-score: 2.8 Rate of change from previous exam: No significant rate of change from previous exam. FRAX 10-YEAR PROBABILITY OF FRACTURE: FRAX not reported as the lowest BMD is not in the osteopenia range. IMPRESSION: Osteoporosis based on BMD. Fracture risk is unknown due to history of bone building therapy. RECOMMENDATIONS: 1. All patients should optimize  calcium  and vitamin D  intake. 2. Consider FDA-approved medical therapies in postmenopausal women and men aged 65 years and older, based on the following: - A hip or vertebral (clinical or morphometric) fracture - T-score less than or equal to -2.5 and secondary causes have been excluded. - Low bone mass (T-score between -1.0 and -2.5) and a 10-year probability of a hip fracture greater than or equal to 3% or a 10-year probability of a major osteoporosis-related fracture greater than or equal to 20% based on the [place] -adapted WHO algorithm. - Clinician judgment and/or patient preferences may indicate treatment for people with 10-year fracture probabilities above or below these levels 3. Patients with diagnosis of osteoporosis or at high risk for fracture should have regular bone mineral density tests. For patients eligible for Medicare, routine testing is allowed once every 2 years. The testing frequency can be increased to one year for patients who have rapidly progressing disease, those who are receiving or discontinuing medical therapy to restore bone mass, or have additional risk  factors. Electronically Signed   By: Amanda Jungling M.D.   On: 07/16/2023 16:00   MM DIAG BREAST TOMO BILATERAL Result Date: 07/16/2023 CLINICAL DATA:  Status post LEFT lumpectomy in August 2023 with subsequent radiation. Patient is on letrozole . EXAM: DIGITAL DIAGNOSTIC BILATERAL MAMMOGRAM WITH TOMOSYNTHESIS AND CAD TECHNIQUE: Bilateral digital diagnostic mammography and breast tomosynthesis was performed. The images were evaluated with computer-aided detection. COMPARISON:  Previous exam(s). ACR Breast Density Category b: There are scattered areas of fibroglandular density. FINDINGS: There is density and architectural distortion within the LEFT breast, consistent with postsurgical changes. These are stable in comparison to prior. No suspicious mass, distortion, or microcalcifications are identified to suggest presence of malignancy.  IMPRESSION: No mammographic evidence of malignancy bilaterally. RECOMMENDATION: Recommend bilateral diagnostic mammogram (with RIGHT and LEFT breast ultrasound if deemed necessary) in 1 year. This will establish over 2 years of observation status post LEFT lumpectomy. I have discussed the findings and recommendations with the patient. If applicable, a reminder letter will be sent to the patient regarding the next appointment. BI-RADS CATEGORY  2: Benign. Electronically Signed   By: Clancy Crimes M.D.   On: 07/16/2023 10:35     Assessment and plan- Patient is a 73 y.o. adult  with history of stage I invasive tubular of the left breast ER positive PR negative HER2 negative status postlumpectomy and adjuvant radiation therapy presently on letrozole .  She is here for routine follow-up visit.  Clinically patient is doing well with no concerning signs and symptoms of recurrence based on today's exam.  She is due for mammogram and bone density scan tomorrow.  She is tolerating letrozole  well without any significant side effects.  I will see her back in 6 months no labs.  She will continue taking her Fosamax  calcium  and vitamin D  as well.   Visit Diagnosis 1. High risk medication use   2. Encounter for follow-up surveillance of breast cancer   3. Use of letrozole  (Femara )      Dr. Seretha Dance, MD, MPH [facility] [phone] 07/16/2023 7:43 PM

## 2023-07-18 DIAGNOSIS — J069 Acute upper respiratory infection, unspecified: Secondary | ICD-10-CM | POA: Diagnosis not present

## 2023-07-18 DIAGNOSIS — R0602 Shortness of breath: Secondary | ICD-10-CM | POA: Diagnosis not present

## 2023-07-18 DIAGNOSIS — R059 Cough, unspecified: Secondary | ICD-10-CM | POA: Diagnosis present

## 2023-07-18 NOTE — ED Triage Notes (Signed)
 Pt presents to ER from home with complains of shortness of breath. Pt reports she developed nasal congestions and cough yesterday., reports generalized body aches. Pt talks in complete sentences no respiratory distress noted

## 2023-07-19 ENCOUNTER — Emergency Department
Admission: EM | Admit: 2023-07-19 | Discharge: 2023-07-19 | Disposition: A | Discharge status: Home / Self Care | Attending: Emergency Medicine | Admitting: Emergency Medicine

## 2023-07-19 ENCOUNTER — Emergency Department

## 2023-07-19 ENCOUNTER — Other Ambulatory Visit: Payer: Self-pay

## 2023-07-19 DIAGNOSIS — J069 Acute upper respiratory infection, unspecified: Secondary | ICD-10-CM | POA: Diagnosis not present

## 2023-07-19 DIAGNOSIS — R0602 Shortness of breath: Secondary | ICD-10-CM

## 2023-07-19 DIAGNOSIS — R051 Acute cough: Secondary | ICD-10-CM

## 2023-07-19 DIAGNOSIS — R6883 Chills (without fever): Secondary | ICD-10-CM

## 2023-07-19 LAB — RESP PANEL BY RT-PCR (RSV, FLU A&B, COVID)  RVPGX2
Influenza A by PCR: NEGATIVE
Influenza B by PCR: NEGATIVE
Resp Syncytial Virus by PCR: NEGATIVE
SARS Coronavirus 2 by RT PCR: NEGATIVE

## 2023-07-19 LAB — CBC
HCT: 42.5 % (ref 36.0–46.0)
Hemoglobin: 13.5 g/dL (ref 12.0–15.0)
MCH: 32.6 pg (ref 26.0–34.0)
MCHC: 31.8 g/dL (ref 30.0–36.0)
MCV: 102.7 fL — ABNORMAL HIGH (ref 80.0–100.0)
Platelets: 136 10*3/uL — ABNORMAL LOW (ref 150–400)
RBC: 4.14 MIL/uL (ref 3.87–5.11)
RDW: 11.7 % (ref 11.5–15.5)
WBC: 3.7 10*3/uL — ABNORMAL LOW (ref 4.0–10.5)
nRBC: 0 % (ref 0.0–0.2)

## 2023-07-19 LAB — BASIC METABOLIC PANEL WITH GFR
Anion gap: 9 (ref 5–15)
BUN: 14 mg/dL (ref 8–23)
CO2: 22 mmol/L (ref 22–32)
Calcium: 8.6 mg/dL — ABNORMAL LOW (ref 8.9–10.3)
Chloride: 103 mmol/L (ref 98–111)
Creatinine, Ser: 0.6 mg/dL (ref 0.44–1.00)
GFR, Estimated: >60 mL/min (ref >60)
Glucose, Bld: 118 mg/dL — ABNORMAL HIGH (ref 70–99)
Potassium: 3.8 mmol/L (ref 3.5–5.1)
Sodium: 134 mmol/L — ABNORMAL LOW (ref 135–145)

## 2023-07-19 NOTE — ED Provider Notes (Signed)
 [facility] Provider Note   Event Date/Time   First MD Initiated Contact with Patient 07/19/23 0045     (approximate) History  Shortness of Breath  HPI Natalie [name] is a 73 y.o. adult with a history of bronchospasm who presents complaining of cough, rhinorrhea, shortness of breath, chills, generalized weakness that began yesterday.  Patient states the symptoms have been worsening since onset.  Patient denies any recent travel or sick contacts.  Patient Dors is subjective fever ROS: Patient currently denies any vision changes, tinnitus, difficulty speaking, facial droop, chest pain, abdominal pain, nausea/vomiting/diarrhea, dysuria, or weakness/numbness/paresthesias in any extremity   Physical Exam  Triage Vital Signs: ED Triage Vitals  Encounter Vitals Group     BP 07/19/23 0000 120/73     Girls Systolic BP Percentile --      Girls Diastolic BP Percentile --      Boys Systolic BP Percentile --      Boys Diastolic BP Percentile --      Pulse Rate 07/19/23 0000 77     Resp 07/19/23 0000 16     Temp 07/19/23 0000 98.6 F (37 C)     Temp Source 07/19/23 0000 Oral     SpO2 07/19/23 0000 97 %     Weight 07/19/23 0001 150 lb (68 kg)     Height 07/19/23 0001 5' 4 (1.626 m)     Head Circumference --      Peak Flow --      Pain Score 07/19/23 0001 0     Pain Loc --      Pain Education --      Exclude from Growth Chart --    Most recent vital signs: Vitals:   07/19/23 0100 07/19/23 0115  BP: 117/70   Pulse: 66   Resp: 18   Temp:    SpO2: 97% 95%   General: Awake, oriented x4. CV:  Good peripheral perfusion. Resp:  Normal effort. Abd:  No distention. Other:  Elderly the overweight Caucasian female resting comfortably in no acute distress.  Erythematous posterior oropharynx ED Results / Procedures / Treatments  Labs (all labs ordered are listed, but only abnormal results are displayed) Labs Reviewed  BASIC METABOLIC PANEL WITH GFR - Abnormal;  Notable for the following components:      Result Value   Sodium 134 (*)    Glucose, Bld 118 (*)    Calcium  8.6 (*)    All other components within normal limits  CBC - Abnormal; Notable for the following components:   WBC 3.7 (*)    MCV 102.7 (*)    Platelets 136 (*)    All other components within normal limits  RESP PANEL BY RT-PCR (RSV, FLU A&B, COVID)  [id number]   EKG ED ECG REPORT I, Charleen Conn, the attending physician, personally viewed and interpreted this ECG. Date: 07/19/2023 EKG Time: 0611 Rate: 70 Rhythm: normal sinus rhythm QRS Axis: normal Intervals: normal ST/T Wave abnormalities: normal Narrative Interpretation: no evidence of acute ischemia RADIOLOGY ED MD interpretation: 2 view chest x-ray interpreted by me shows no evidence of acute abnormalities including no pneumonia, pneumothorax, or widened mediastinum - All radiology independently interpreted and agree with radiology assessment Official radiology report(s): DG Chest 2 View Result Date: 07/19/2023 CLINICAL DATA:  Shortness of breath. EXAM: CHEST - 2 VIEW COMPARISON:  October 31, 2014 FINDINGS: The heart size and mediastinal contours are within normal limits. A radiopaque loop recorder device is noted. Both  lungs are clear. Moderate to marked severity dextroscoliosis of the lower thoracic and upper lumbar spine is seen. IMPRESSION: No active cardiopulmonary disease. Electronically Signed   By: Virgle Grime M.D.   On: 07/19/2023 00:30   PROCEDURES: Critical Care performed: No Procedures MEDICATIONS ORDERED IN ED: Medications - No data to display IMPRESSION / MDM / ASSESSMENT AND PLAN / ED COURSE  I reviewed the triage vital signs and the nursing notes.                             The patient is on the cardiac monitor to evaluate for evidence of arrhythmia and/or significant heart rate changes. Patient's presentation is most consistent with acute presentation with potential threat to life or bodily  function. Otherwise healthy patient presenting with constellation of symptoms likely representing uncomplicated viral upper respiratory symptoms as characterized by mild pharyngitis, nasal congestion, cough, and shortness of breath  Unlikely PTA/RPA: no hot potato voice, no uvular deviation, Unlikely Esophageal rupture: No history of dysphagia Unlikely deep space infection/Ludwig's Low suspicion for CNS infection bacterial sinusitis, or pneumonia given exam and history.  Unlikely Strep or EBV as centor negative and with no pharyngeal exudate, posterior LAD, or splenomegaly.  Will attempt to alleviate symptoms conservatively; no overt indications at this time for antibiotics. No respiratory distress, otherwise relatively well appearing and nontoxic. Will discuss prompt follow up with PMD and strict return precautions.   FINAL CLINICAL IMPRESSION(S) / ED DIAGNOSES   Final diagnoses:  Upper respiratory tract infection, unspecified type  Shortness of breath  Chills  Acute cough   Rx / DC Orders   ED Discharge Orders     None      Note:  This document was prepared using Dragon voice recognition software and may include unintentional dictation errors.   Muaaz Brau K, MD 07/19/23 [id number]

## 2023-07-21 ENCOUNTER — Other Ambulatory Visit: Payer: Self-pay | Admitting: Nurse Practitioner

## 2023-07-21 ENCOUNTER — Encounter

## 2023-07-23 ENCOUNTER — Encounter

## 2023-07-28 ENCOUNTER — Encounter

## 2023-07-28 DIAGNOSIS — C50919 Malignant neoplasm of unspecified site of unspecified female breast: Secondary | ICD-10-CM

## 2023-07-28 NOTE — Progress Notes (Signed)
 Daily Session Note  Patient Details  Name: ILLEANA EDICK MRN: [id number] Date of Birth: 05/24/1950 Referring Provider:   Conrad Ports Cancer Associated Rehabilitation & Exercise from 03/31/2023 in [facility] Cardiac and Pulmonary Rehab  Referring Provider Melanee Piggs, MD    Encounter Date: 07/28/2023  Check In:  Session Check In - 07/28/23 1259       Check-In   Supervising physician immediately available to respond to emergencies See telemetry face sheet for immediately available ER MD    Location [facility]-Cardiac & Pulmonary Rehab    Staff Present Devaughn Jaeger, BS, Exercise Physiologist;Margaret Best, MS, Exercise Physiologist    Virtual Visit No    Medication changes reported     No    Fall or balance concerns reported    No    Warm-up and Cool-down Performed on first and last piece of equipment    Resistance Training Performed Yes    VAD Patient? No    PAD/SET Patient? No      Pain Assessment   Currently in Pain? No/denies    Multiple Pain Sites No             Social History   Tobacco Use  Smoking Status Former   Current packs/day: 0.00   Average packs/day: 0.5 packs/day for 6.0 years (3.0 ttl pk-yrs)   Types: Cigarettes   Start date: 02/12/1968   Quit date: 02/11/1974   Years since quitting: 49.4  Smokeless Tobacco Never    Goals Met:  Independence with exercise equipment Exercise tolerated well No report of concerns or symptoms today  Goals Unmet:  Not Applicable  Comments: Pt able to follow exercise prescription today without complaint.  Will continue to monitor for progression.    Dr. Oneil Pinal is Medical Director for [facility] Cardiac Rehabilitation.  Dr. Fuad Aleskerov is Medical Director for [facility] Pulmonary Rehabilitation.

## 2023-07-30 ENCOUNTER — Encounter

## 2023-07-30 DIAGNOSIS — C50919 Malignant neoplasm of unspecified site of unspecified female breast: Secondary | ICD-10-CM

## 2023-07-30 NOTE — Progress Notes (Signed)
 Daily Session Note  Patient Details  Name: Natalie [name] MRN: [id number] Date of Birth: Apr 04, 1950 Referring Provider:   Conrad Ports Cancer Associated Rehabilitation & Exercise from 03/31/2023 in [facility] Cardiac and Pulmonary Rehab  Referring Provider Melanee Piggs, MD    Encounter Date: 07/30/2023  Check In:  Session Check In - 07/30/23 1329       Check-In   Supervising physician immediately available to respond to emergencies See telemetry face sheet for immediately available ER MD    Location [facility]-Cardiac & Pulmonary Rehab    Staff Present Vanissa Strength BS, Exercise Physiologist    Virtual Visit No    Medication changes reported     No    Fall or balance concerns reported    No    Warm-up and Cool-down Performed on first and last piece of equipment    Resistance Training Performed Yes    VAD Patient? No             Social History   Tobacco Use  Smoking Status Former   Current packs/day: 0.00   Average packs/day: 0.5 packs/day for 6.0 years (3.0 ttl pk-yrs)   Types: Cigarettes   Start date: 02/12/1968   Quit date: 02/11/1974   Years since quitting: 49.4  Smokeless Tobacco Never    Goals Met:  Independence with exercise equipment Exercise tolerated well No report of concerns or symptoms today  Goals Unmet:  Not Applicable  Comments: Pt able to follow exercise prescription today without complaint.  Will continue to monitor for progression.    Dr. Oneil Pinal is Medical Director for [facility] Cardiac Rehabilitation.  Dr. Fuad Aleskerov is Medical Director for [facility] Pulmonary Rehabilitation.

## 2023-08-03 DIAGNOSIS — Z853 Personal history of malignant neoplasm of breast: Secondary | ICD-10-CM | POA: Diagnosis not present

## 2023-08-03 DIAGNOSIS — Z08 Encounter for follow-up examination after completed treatment for malignant neoplasm: Secondary | ICD-10-CM | POA: Diagnosis not present

## 2023-08-04 ENCOUNTER — Encounter

## 2023-08-06 ENCOUNTER — Encounter: Attending: Oncology

## 2023-08-06 DIAGNOSIS — C50919 Malignant neoplasm of unspecified site of unspecified female breast: Secondary | ICD-10-CM | POA: Insufficient documentation

## 2023-08-06 NOTE — Progress Notes (Signed)
 Daily Session Note  Patient Details  Name: Natalie [name] MRN: [id number] Date of Birth: 01/28/1951 Referring Provider:   Conrad Ports Cancer Associated Rehabilitation & Exercise from 03/31/2023 in [facility] Cardiac and Pulmonary Rehab  Referring Provider Melanee Piggs, MD    Encounter Date: 08/06/2023  Check In:  Session Check In - 08/06/23 1327       Check-In   Supervising physician immediately available to respond to emergencies See telemetry face sheet for immediately available ER MD    Location [facility]-Cardiac & Pulmonary Rehab    Staff Present Shatera Rennert BS, Exercise Physiologist    Virtual Visit No    Medication changes reported     No    Fall or balance concerns reported    No    Tobacco Cessation No Change    Warm-up and Cool-down Performed on first and last piece of equipment    VAD Patient? No             Social History   Tobacco Use  Smoking Status Former   Current packs/day: 0.00   Average packs/day: 0.5 packs/day for 6.0 years (3.0 ttl pk-yrs)   Types: Cigarettes   Start date: 02/12/1968   Quit date: 02/11/1974   Years since quitting: 49.5  Smokeless Tobacco Never    Goals Met:  Independence with exercise equipment Exercise tolerated well No report of concerns or symptoms today  Goals Unmet:  Not Applicable  Comments: Pt able to follow exercise prescription today without complaint.  Will continue to monitor for progression.    Dr. Oneil Pinal is Medical Director for [facility] Cardiac Rehabilitation.  Dr. Fuad Aleskerov is Medical Director for [facility] Pulmonary Rehabilitation.

## 2023-08-11 ENCOUNTER — Encounter

## 2023-08-11 DIAGNOSIS — C50919 Malignant neoplasm of unspecified site of unspecified female breast: Secondary | ICD-10-CM

## 2023-08-11 NOTE — Progress Notes (Signed)
 Daily Session Note  Patient Details  Name: Natalie [name] MRN: [id number] Date of Birth: April 08, 1950 Referring Provider:   Conrad Ports Cancer Associated Rehabilitation & Exercise from 03/31/2023 in [facility] Cardiac and Pulmonary Rehab  Referring Provider Melanee Piggs, MD    Encounter Date: 08/11/2023  Check In:  Session Check In - 08/11/23 1439       Check-In   Supervising physician immediately available to respond to emergencies See telemetry face sheet for immediately available ER MD    Location [facility]-Cardiac & Pulmonary Rehab    Staff Present Rollene Paterson, MS, Exercise Physiologist    Virtual Visit No    Medication changes reported     No    Fall or balance concerns reported    No    Warm-up and Cool-down Performed on first and last piece of equipment    Resistance Training Performed Yes    VAD Patient? No    PAD/SET Patient? No      Pain Assessment   Currently in Pain? No/denies    Multiple Pain Sites No             Social History   Tobacco Use  Smoking Status Former   Current packs/day: 0.00   Average packs/day: 0.5 packs/day for 6.0 years (3.0 ttl pk-yrs)   Types: Cigarettes   Start date: 02/12/1968   Quit date: 02/11/1974   Years since quitting: 49.5  Smokeless Tobacco Never    Goals Met:  Independence with exercise equipment Exercise tolerated well No report of concerns or symptoms today  Goals Unmet:  Not Applicable  Comments: Pt able to follow exercise prescription today without complaint.  Will continue to monitor for progression.    Dr. Oneil Pinal is Medical Director for [facility] Cardiac Rehabilitation.  Dr. Fuad Aleskerov is Medical Director for [facility] Pulmonary Rehabilitation.

## 2023-08-13 ENCOUNTER — Encounter

## 2023-08-13 VITALS — Ht 66.1 in | Wt 153.5 lb

## 2023-08-13 DIAGNOSIS — C50919 Malignant neoplasm of unspecified site of unspecified female breast: Secondary | ICD-10-CM

## 2023-08-13 NOTE — Patient Instructions (Signed)
 Discharge Patient Instructions  Patient Details  Name: Natalie [name] MRN: [id number] Date of Birth: 1950-12-15 Referring Provider:  Sowles, Krichna, MD   Number of Visits: 24  Reason for Discharge:  Patient reached a stable level of exercise. Patient independent in their exercise. Patient has met program and personal goals.  Diagnosis:  No diagnosis found.  Initial Exercise Prescription:  Initial Exercise Prescription - 03/31/23 1400       Date of Initial Exercise RX and Referring Provider   Date 03/31/23    Referring Provider Melanee Piggs, MD      Oxygen   Maintain Oxygen Saturation 88% or higher      Treadmill   MPH 2.5   try   Grade 0    Minutes 15    METs 2.91      Recumbant Bike   Level 2    RPM 50    Watts 22    Minutes 15    METs 3.01      NuStep   Level 3    SPM 80    Minutes 15    METs 3.01      REL-XR   Level 2    Speed 50    Minutes 15    METs 3.01      Track   Laps 37    Minutes 15    METs 3.01      Prescription Details   Frequency (times per week) 2    Duration Progress to 30 minutes of continuous aerobic without signs/symptoms of physical distress      Intensity   THRR 40-80% of Max Heartrate 103-132    Ratings of Perceived Exertion 11-13    Perceived Dyspnea 0-4      Progression   Progression Continue to progress workloads to maintain intensity without signs/symptoms of physical distress.      Resistance Training   Training Prescription Yes    Weight 3 lb    Reps 10-15          Discharge Exercise Prescription (Final Exercise Prescription Changes):  Exercise Prescription Changes - 03/31/23 1400       Response to Exercise   Blood Pressure (Admit) 126/84    Blood Pressure (Exercise) 130/80    Blood Pressure (Exit) 114/78    Heart Rate (Admit) 75 bpm    Heart Rate (Exercise) 106 bpm    Heart Rate (Exit) 80 bpm    Oxygen Saturation (Admit) 92 %    Oxygen Saturation (Exercise) 97 %    Oxygen Saturation (Exit) 96  %    Rating of Perceived Exertion (Exercise) 12    Perceived Dyspnea (Exercise) 0    Symptoms none    Comments results    Duration Progress to 30 minutes of  aerobic without signs/symptoms of physical distress    Intensity THRR New      Progression   Progression Continue to progress workloads to maintain intensity without signs/symptoms of physical distress.    Average METs 3.01          Functional Capacity:  6 Minute Walk     Row Name 03/31/23 1431 08/13/23 1239       6 Minute Walk   Phase Initial Discharge    Distance 1360 feet 1510 feet    Distance % Change -- 11 %    Distance Feet Change -- 150 ft    Walk Time 6 minutes 6 minutes    # of Rest Breaks 0 0  MPH 2.58 2.86    METS 3.01 3.3    RPE 12 9    Perceived Dyspnea  0 0    VO2 Peak 10.54 11.6    Symptoms No No    Resting HR 75 bpm 69 bpm    Resting BP 126/84 116/64    Resting Oxygen Saturation  92 % 95 %    Exercise Oxygen Saturation  during 6 min walk 97 % 96 %    Max Ex. HR 106 bpm 104 bpm    Max Ex. BP 130/80 134/68    2 Minute Post BP 114/78 --      Nutrition & Weight - Outcomes:  Pre Biometrics - 03/31/23 1439       Pre Biometrics   Height 5' 6.1 (1.679 m)    Weight 155 lb 9.6 oz (70.6 kg)    BMI (Calculated) 25.04    Single Leg Stand 11 seconds          Post Biometrics - 08/13/23 1242        Post  Biometrics   Height 5' 6.1 (1.679 m)    Weight 153 lb 8 oz (69.6 kg)    BMI (Calculated) 24.7    Single Leg Stand 18.37 seconds

## 2023-08-13 NOTE — Progress Notes (Signed)
 Daily Session Note  Patient Details  Name: Natalie [name] MRN: [id number] Date of Birth: 1950/02/07 Referring Provider:   Conrad Ports Cancer Associated Rehabilitation & Exercise from 03/31/2023 in [facility] Cardiac and Pulmonary Rehab  Referring Provider Melanee Piggs, MD    Encounter Date: 08/13/2023  Check In:  Session Check In - 08/13/23 1246       Check-In   Supervising physician immediately available to respond to emergencies See telemetry face sheet for immediately available ER MD    Location [facility]-Cardiac & Pulmonary Rehab    Staff Present Rollene Paterson, MS, Exercise Physiologist;Maxon Conetta BS, Exercise Physiologist    Virtual Visit No    Medication changes reported     No    Fall or balance concerns reported    No    Warm-up and Cool-down Performed on first and last piece of equipment    Resistance Training Performed Yes    VAD Patient? No    PAD/SET Patient? No      Pain Assessment   Currently in Pain? No/denies    Multiple Pain Sites No             Social History   Tobacco Use  Smoking Status Former   Current packs/day: 0.00   Average packs/day: 0.5 packs/day for 6.0 years (3.0 ttl pk-yrs)   Types: Cigarettes   Start date: 02/12/1968   Quit date: 02/11/1974   Years since quitting: 49.5  Smokeless Tobacco Never    Goals Met:  Independence with exercise equipment Exercise tolerated well No report of concerns or symptoms today  Goals Unmet:  Not Applicable  Comments:  Piya graduated today from  rehab with 24 sessions completed.  Details of the patient's exercise prescription and what She needs to do in order to continue the prescription and progress were discussed with patient.  Patient was given a copy of prescription and goals. Patient verbalized understanding. Post was completed today and results are below. Brendan plans to continue to exercise by using her new treadmill or outside walking 2 miles a day, balance exercises, 5lb handweights,  and lighter (2-3 lb) weights to hold while walking. She also plans to check out the [facility]   6 Minute Walk     Row Name 03/31/23 1431 08/13/23 1239       6 Minute Walk   Phase Initial Discharge    Distance 1360 feet 1510 feet    Distance % Change -- 11 %    Distance Feet Change -- 150 ft    Walk Time 6 minutes 6 minutes    # of Rest Breaks 0 0    MPH 2.58 2.86    METS 3.01 3.3    RPE 12 9    Perceived Dyspnea  0 0    VO2 Peak 10.54 11.6    Symptoms No No    Resting HR 75 bpm 69 bpm    Resting BP 126/84 116/64    Resting Oxygen Saturation  92 % 95 %    Exercise Oxygen Saturation  during 6 min walk 97 % 96 %    Max Ex. HR 106 bpm 104 bpm    Max Ex. BP 130/80 134/68    2 Minute Post BP 114/78 --         Dr. Oneil Pinal is Medical Director for [facility] Cardiac Rehabilitation.  Dr. Fuad Aleskerov is Medical Director for [facility] Pulmonary Rehabilitation.

## 2023-10-02 ENCOUNTER — Other Ambulatory Visit: Payer: Self-pay | Admitting: Nurse Practitioner

## 2023-10-12 ENCOUNTER — Other Ambulatory Visit: Payer: Self-pay | Admitting: Nurse Practitioner

## 2023-10-14 ENCOUNTER — Ambulatory Visit: Admitting: Family Medicine

## 2023-10-14 ENCOUNTER — Encounter: Payer: Self-pay | Admitting: Family Medicine

## 2023-10-14 VITALS — BP 118/68 | HR 78 | Resp 16 | Ht 66.1 in | Wt 155.3 lb

## 2023-10-14 DIAGNOSIS — C50912 Malignant neoplasm of unspecified site of left female breast: Secondary | ICD-10-CM

## 2023-10-14 DIAGNOSIS — E785 Hyperlipidemia, unspecified: Secondary | ICD-10-CM | POA: Diagnosis not present

## 2023-10-14 DIAGNOSIS — D696 Thrombocytopenia, unspecified: Secondary | ICD-10-CM

## 2023-10-14 DIAGNOSIS — M720 Palmar fascial fibromatosis [Dupuytren]: Secondary | ICD-10-CM | POA: Diagnosis not present

## 2023-10-14 DIAGNOSIS — Z23 Encounter for immunization: Secondary | ICD-10-CM

## 2023-10-14 DIAGNOSIS — D708 Other neutropenia: Secondary | ICD-10-CM

## 2023-10-14 DIAGNOSIS — Z8673 Personal history of transient ischemic attack (TIA), and cerebral infarction without residual deficits: Secondary | ICD-10-CM

## 2023-10-14 DIAGNOSIS — F339 Major depressive disorder, recurrent, unspecified: Secondary | ICD-10-CM | POA: Diagnosis not present

## 2023-10-14 DIAGNOSIS — E538 Deficiency of other specified B group vitamins: Secondary | ICD-10-CM

## 2023-10-14 DIAGNOSIS — E559 Vitamin D deficiency, unspecified: Secondary | ICD-10-CM

## 2023-10-14 DIAGNOSIS — L989 Disorder of the skin and subcutaneous tissue, unspecified: Secondary | ICD-10-CM

## 2023-10-14 DIAGNOSIS — M810 Age-related osteoporosis without current pathological fracture: Secondary | ICD-10-CM | POA: Diagnosis not present

## 2023-10-14 DIAGNOSIS — E039 Hypothyroidism, unspecified: Secondary | ICD-10-CM

## 2023-10-14 MED ORDER — LEVOTHYROXINE SODIUM 75 MCG PO TABS: 75.0000 ug | ORAL_TABLET | Freq: Every day | ORAL | 1 refills | Status: AC

## 2023-10-14 MED ORDER — CITALOPRAM HYDROBROMIDE 40 MG PO TABS: 40.0000 mg | ORAL_TABLET | Freq: Every day | ORAL | 1 refills | Status: AC

## 2023-10-14 MED ORDER — BUPROPION HCL ER (SR) 150 MG PO TB12: 150.0000 mg | ORAL_TABLET | Freq: Two times a day (BID) | ORAL | 1 refills | Status: AC

## 2023-10-14 MED ORDER — ATORVASTATIN CALCIUM 40 MG PO TABS: 40.0000 mg | ORAL_TABLET | Freq: Every day | ORAL | 1 refills | Status: DC

## 2023-10-14 NOTE — Progress Notes (Signed)
 Name: Natalie [name]   MRN: [id number]    DOB: Mar 14, 1950   Date:10/14/2023       Progress Note  Subjective  Chief Complaint  Chief Complaint  Patient presents with   Medical Management of Chronic Issues   Discussed the use of AI scribe software for clinical note transcription with the patient, who gave verbal consent to proceed.  History of Present Illness Natalie [name] is a 73 year old female with recurrent strokes who presents for a follow-up visit.  She experiences significant fatigue, particularly after activities such as grocery shopping and running errands, described as feeling 'dead' tired and lasting for a day. Her sleep is inconsistent, with some nights of good rest and others disrupted by staying up late watching movies and a new kitten waking her at night.  She recently visited the emergency room for shortness of breath, fever, and chills, diagnosed as an upper respiratory infection. She has a history of breast cancer, having undergone a partial mastectomy and lumpectomy of the left breast in August 2023. She is currently taking letrozole  2.5 mg daily for estrogen receptor-positive breast cancer and is up to date with her oncologist visits.  She has osteoporosis with a bone density T-score of -3.1 at the femoral neck. She is taking alendronate  and maintains a high calcium  diet and vitamin D  supplementation, with no side effects reported.  She has a history of neutropenia and thrombocytopenia, with a current white blood cell count of 3.5. She is not experiencing bleeding or major infections. Her B12 and vitamin D  levels are stable with supplementation.  She has adult hypothyroidism and is taking levothyroxine  75 mcg daily. She experiences dry skin and thin hair, but no significant changes in bowel movements or other symptoms.  She has a history of recurrent strokes, with the first TIA in January 2022, a stroke in March 2022, and another in November 2023. She is on  atorvastatin  and aspirin , and uses a mouthpiece for sleep apnea. She reports mild cognitive dysfunction, with difficulty finding words and delayed memory recall, which she feels has worsened recently.  She has a major depressive disorder and is taking Wellbutrin  SR 150 mg twice daily and citalopram  40 mg daily. She does not feel as depressed as in the past, though she experiences occasional low days.  She has a history of migraine headaches, previously treated with Ubrelvy , but now finds relief with Excedrin migraine as her migraines have become less frequent and less severe.  She has contractures in both hands, with the right hand more affected. She received injections from Doctor Soro, which have helped with trigger fingers, but she reports decreased strength in her right hand, making it difficult to open jars.    Patient Active Problem List   Diagnosis Date Noted   History of colonic polyps 04/16/2023   TIA on medication 09/12/2022   B12 deficiency 09/12/2022   History of malignant neoplasm of endometrium 09/12/2022   Trigger finger, right ring finger 09/10/2021   Dupuytren's contracture of both hands 09/10/2021   Vitamin D  deficiency 09/10/2021   Malignant neoplasm of left breast in female, estrogen receptor positive (HCC) 09/01/2021   Dyslipidemia 05/16/2021   Major depression, recurrent, chronic (HCC) 03/13/2021   Endometrial cancer (HCC) 12/26/2020   Postoperative anemia due to acute blood loss 12/19/2020   History of CVA (cerebrovascular accident) 04/19/2020   History of ischemic multifocal posterior circulation stroke 02/29/2020   Difficulty hearing 06/28/2015   Arthritis, degenerative 06/28/2015   Purpura,  nonthrombopenic (HCC) 06/28/2015   Adult hypothyroidism 08/09/2014   Migraine without aura and without status migrainosus, not intractable 09/19/2008   OP (osteoporosis) 09/07/2006    Past Surgical History:  Procedure Laterality Date   ABDOMINAL HYSTERECTOMY  12/18/2020    ABLATION  2006   AXILLARY SENTINEL NODE BIOPSY Left 09/25/2021   Procedure: AXILLARY SENTINEL NODE BIOPSY;  Surgeon: Dessa Reyes ORN, MD;  Location: [facility] ORS;  Service: General;  Laterality: Left;   BREAST BIOPSY Left 08/21/2021   Stereo Bx, X-clip; path --> IMC (G1, ER/PR +, Her2/neu -)   BREAST LUMPECTOMY Left 09/25/2021   BREAST LUMPECTOMY WITH NEEDLE LOCALIZATION Left 09/25/2021   Procedure: BREAST LUMPECTOMY WITH NEEDLE LOCALIZATION;  Surgeon: Dessa Reyes ORN, MD;  Location: [facility] ORS;  Service: General;  Laterality: Left;   BREAST SURGERY  July 2023   BUBBLE STUDY  04/23/2020   Procedure: BUBBLE STUDY;  Surgeon: Rolan Ezra RAMAN, MD;  Location: [facility] ENDOSCOPY;  Service: Cardiovascular;;   CATARACT EXTRACTION W/ INTRAOCULAR LENS IMPLANT Bilateral    COLONOSCOPY     COLONOSCOPY WITH PROPOFOL  N/A 12/22/2018   Procedure: COLONOSCOPY WITH PROPOFOL ;  Surgeon: Janalyn Keene NOVAK, MD;  Location: [facility] ENDOSCOPY;  Service: Endoscopy;  Laterality: N/A;   COLONOSCOPY WITH PROPOFOL  N/A 04/16/2023   Procedure: COLONOSCOPY WITH PROPOFOL ;  Surgeon: Unk Corinn Skiff, MD;  Location: [facility] ENDOSCOPY;  Service: Gastroenterology;  Laterality: N/A;   EYE SURGERY  Spring 2018   Cataracts both eyes   HYSTEROSCOPY WITH D & C N/A 11/01/2020   Procedure: DILATATION AND CURETTAGE /HYSTEROSCOPY WITH MYOSURE;  Surgeon: Viktoria Comer SAUNDERS, MD;  Location: [facility] ORS;  Service: Gynecology;  Laterality: N/A;   LAPAROTOMY  12/18/2020   Procedure: LAPAROTOMY;  Surgeon: Viktoria Comer SAUNDERS, MD;  Location: [facility] ORS;  Service: Gynecology;;   LOOP RECORDER INSERTION N/A 04/23/2020   Procedure: LOOP RECORDER INSERTION;  Surgeon: Waddell Danelle ORN, MD;  Location: [facility] INVASIVE CV LAB;  Service: Cardiovascular;  Laterality: N/A;   ROBOTIC ASSISTED TOTAL HYSTERECTOMY WITH BILATERAL SALPINGO OOPHERECTOMY Bilateral 12/18/2020   Procedure: XI ROBOTIC ASSISTED TOTAL HYSTERECTOMY WITH BILATERAL SALPINGO OOPHORECTOMY;  Surgeon:  Viktoria Comer SAUNDERS, MD;  Location: [facility] ORS;  Service: Gynecology;  Laterality: Bilateral;   SENTINEL NODE BIOPSY N/A 12/18/2020   Procedure: SENTINEL NODE BIOPSY;  Surgeon: Viktoria Comer SAUNDERS, MD;  Location: [facility] ORS;  Service: Gynecology;  Laterality: N/A;   TEE WITHOUT CARDIOVERSION N/A 04/23/2020   Procedure: TRANSESOPHAGEAL ECHOCARDIOGRAM (TEE);  Surgeon: Rolan Ezra RAMAN, MD;  Location: [facility] ENDOSCOPY;  Service: Cardiovascular;  Laterality: N/A;   TONSILLECTOMY      Family History  Problem Relation Age of Onset   Early death Father        suicide   Depression Father    Diabetes Father    Cancer Father        prostate   Hearing loss Father        due to war   Alzheimer's disease Mother    Heart disease Brother    Atrial fibrillation Brother    Heart disease Maternal Aunt    Dementia Maternal Aunt    Heart disease Maternal Uncle    Dementia Maternal Grandmother    Arthritis Maternal Grandmother    Heart disease Brother    Atrial fibrillation Brother    Colon cancer Neg Hx    Breast cancer Neg Hx    Ovarian cancer Neg Hx    Pancreatic cancer Neg Hx    Endometrial cancer Neg Hx  Social History   Tobacco Use   Smoking status: Former    Current packs/day: 0.00    Average packs/day: 0.5 packs/day for 6.0 years (3.0 ttl pk-yrs)    Types: Cigarettes    Start date: 02/12/1968    Quit date: 02/11/1974    Years since quitting: 49.7   Smokeless tobacco: Never  Substance Use Topics   Alcohol use: Yes    Comment: rare     Current Outpatient Medications:    alendronate  (FOSAMAX ) 70 MG tablet, TAKE 1 TABLET(70 MG) BY MOUTH 1 TIME A WEEK WITH A FULL GLASS OF WATER  AND ON AN EMPTY STOMACH, Disp: 12 tablet, Rfl: 0   aspirin  81 MG EC tablet, Take by mouth., Disp: , Rfl:    aspirin -acetaminophen -caffeine (EXCEDRIN MIGRAINE) 250-250-65 MG tablet, Take by mouth every 6 (six) hours as needed for headache or migraine., Disp: , Rfl:    atorvastatin  (LIPITOR) 40 MG tablet, TAKE 1  TABLET(40 MG) BY MOUTH DAILY, Disp: 90 tablet, Rfl: 1   buPROPion  (WELLBUTRIN  SR) 150 MG 12 hr tablet, TAKE 1 TABLET(150 MG) BY MOUTH TWICE DAILY, Disp: 180 tablet, Rfl: 1   calcium  carbonate (OSCAL) 1500 (600 Ca) MG TABS tablet, Take 600 mg of elemental calcium  by mouth 1 day or 1 dose., Disp: , Rfl:    cholecalciferol (VITAMIN D3) 25 MCG (1000 UNIT) tablet, Take 1,000 Units by mouth daily., Disp: , Rfl:    citalopram  (CELEXA ) 40 MG tablet, TAKE 1 TABLET(40 MG) BY MOUTH DAILY, Disp: 90 tablet, Rfl: 1   Cyanocobalamin  (VITAMIN B-12) 1000 MCG SUBL, Place 1 tablet (1,000 mcg total) under the tongue 2 (two) times a week., Disp: 30 tablet, Rfl: 0   letrozole  (FEMARA ) 2.5 MG tablet, TAKE 1 TABLET(2.5 MG) BY MOUTH DAILY, Disp: 90 tablet, Rfl: 1   levothyroxine  (SYNTHROID ) 75 MCG tablet, TAKE 1 TABLET(75 MCG) BY MOUTH DAILY BEFORE BREAKFAST, Disp: 90 tablet, Rfl: 1   Multiple Vitamin (MULTIVITAMIN WITH MINERALS) TABS tablet, Take 1 tablet by mouth daily., Disp: , Rfl:    Ubrogepant  (UBRELVY ) 100 MG TABS, Take 100 mg by mouth daily as needed (migraines)., Disp: , Rfl:   Allergies  Allergen Reactions   Sulfamethoxazole-Trimethoprim Hives   Tnkase  [Tenecteplase ] Swelling    Mild focal left upper lip swelling    I personally reviewed active problem list, medication list, allergies with the patient/caregiver today.   ROS  Ten systems reviewed and is negative except as mentioned in HPI    Objective Physical Exam CONSTITUTIONAL: Patient appears well-developed and well-nourished. No distress. HEENT: Head atraumatic, normocephalic, neck supple. CARDIOVASCULAR: Normal rate, regular rhythm and normal heart sounds. No murmur heard. No BLE edema. PULMONARY: Effort normal and breath sounds normal. No respiratory distress. ABDOMINAL: There is no tenderness or distention. MUSCULOSKELETAL: Normal gait. Without gross motor or sensory deficit. PSYCHIATRIC: Patient has a normal mood and affect. Behavior is  normal. Judgment and thought content normal.  Vitals:   10/14/23 0941  BP: 118/68  Pulse: 78  Resp: 16  SpO2: 98%  Weight: 155 lb 4.8 oz (70.4 kg)  Height: 5' 6.1 (1.679 m)    Body mass index is 24.99 kg/m.  Recent Results (from the past 2160 hours)  Basic metabolic panel     Status: Abnormal   Collection Time: 07/19/23 12:05 AM  Result Value Ref Range   Sodium 134 (L) 135 - 145 mmol/L   Potassium 3.8 3.5 - 5.1 mmol/L   Chloride 103 98 - 111 mmol/L   CO2  22 22 - 32 mmol/L   Glucose, Bld 118 (H) 70 - 99 mg/dL    Comment: Glucose reference range applies only to samples taken after fasting for at least 8 hours.   BUN 14 8 - 23 mg/dL   Creatinine, Ser 9.39 0.44 - 1.00 mg/dL   Calcium  8.6 (L) 8.9 - 10.3 mg/dL   GFR, Estimated >39 >39 mL/min    Comment: (NOTE) Calculated using the CKD-EPI Creatinine Equation (2021)    Anion gap 9 5 - 15    Comment: Performed at [facility], [address] [place], [place] [address]  CBC     Status: Abnormal   Collection Time: 07/19/23 12:05 AM  Result Value Ref Range   WBC 3.7 (L) 4.0 - 10.5 K/uL   RBC 4.14 3.87 - 5.11 MIL/uL   Hemoglobin 13.5 12.0 - 15.0 g/dL   HCT 57.4 63.9 - 53.9 %   MCV 102.7 (H) 80.0 - 100.0 fL   MCH 32.6 26.0 - 34.0 pg   MCHC 31.8 30.0 - 36.0 g/dL   RDW 88.2 88.4 - 84.4 %   Platelets 136 (L) 150 - 400 K/uL   nRBC 0.0 0.0 - 0.2 %    Comment: Performed at [facility], [address] [place], [place] [address]  Resp panel by RT-PCR (RSV, Flu A&B, Covid) Anterior Nasal Swab     Status: None   Collection Time: 07/19/23 12:05 AM   Specimen: Anterior Nasal Swab  Result Value Ref Range   SARS Coronavirus 2 by RT PCR NEGATIVE NEGATIVE    Comment: (NOTE) SARS-CoV-2 target nucleic acids are NOT DETECTED.  The SARS-CoV-2 RNA is generally detectable in upper respiratory specimens during the acute phase of infection. The lowest concentration of SARS-CoV-2 viral copies this assay can detect is 138  copies/mL. A negative result does not preclude SARS-Cov-2 infection and should not be used as the sole basis for treatment or other patient management decisions. A negative result may occur with  improper specimen collection/handling, submission of specimen other than nasopharyngeal swab, presence of viral mutation(s) within the areas targeted by this assay, and inadequate number of viral copies(<138 copies/mL). A negative result must be combined with clinical observations, patient history, and epidemiological information. The expected result is Negative.  Fact Sheet for Patients:  BloggerCourse.com  Fact Sheet for Healthcare Providers:  SeriousBroker.it  This test is no t yet approved or cleared by the [place]  FDA and  has been authorized for detection and/or diagnosis of SARS-CoV-2 by FDA under an Emergency Use Authorization (EUA). This EUA will remain  in effect (meaning this test can be used) for the duration of the COVID-19 declaration under Section 564(b)(1) of the Act, 21 U.S.C.section 360bbb-3(b)(1), unless the authorization is terminated  or revoked sooner.       Influenza A by PCR NEGATIVE NEGATIVE   Influenza B by PCR NEGATIVE NEGATIVE    Comment: (NOTE) The Xpert Xpress SARS-CoV-2/FLU/RSV plus assay is intended as an aid in the diagnosis of influenza from Nasopharyngeal swab specimens and should not be used as a sole basis for treatment. Nasal washings and aspirates are unacceptable for Xpert Xpress SARS-CoV-2/FLU/RSV testing.  Fact Sheet for Patients: BloggerCourse.com  Fact Sheet for Healthcare Providers: SeriousBroker.it  This test is not yet approved or cleared by the [place]  FDA and has been authorized for detection and/or diagnosis of SARS-CoV-2 by FDA under an Emergency Use Authorization (EUA). This EUA will remain in effect (meaning this test  can be used) for the duration of the COVID-19 declaration under Section 564(b)(1) of the Act, 21 U.S.C. section 360bbb-3(b)(1), unless the authorization is terminated or revoked.     Resp Syncytial Virus by PCR NEGATIVE NEGATIVE    Comment: (NOTE) Fact Sheet for Patients: BloggerCourse.com  Fact Sheet for Healthcare Providers: SeriousBroker.it  This test is not yet approved or cleared by the [place]  FDA and has been authorized for detection and/or diagnosis of SARS-CoV-2 by FDA under an Emergency Use Authorization (EUA). This EUA will remain in effect (meaning this test can be used) for the duration of the COVID-19 declaration under Section 564(b)(1) of the Act, 21 U.S.C. section 360bbb-3(b)(1), unless the authorization is terminated or revoked.  Performed at [facility], [address] [place], [place] [address]       PHQ2/9:    10/14/2023    9:39 AM 04/09/2023    1:54 PM 09/12/2022   11:16 AM 04/15/2022    2:13 PM 04/03/2022    2:51 PM  Depression screen PHQ 2/9  Decreased Interest 0 0 0 0 0  Down, Depressed, Hopeless 0 0 0 1 1  PHQ - 2 Score 0 0 0 1 1  Altered sleeping 0 0 0 1 0  Tired, decreased energy 0 0 1 1 1   Change in appetite 0 0 0 1 1  Feeling bad or failure about yourself  0 0 0 0 0  Trouble concentrating 0 0 0 0 0  Moving slowly or fidgety/restless 0 0 0 0 0  Suicidal thoughts 0 0 0 0 0  PHQ-9 Score 0 0 1 4 3   Difficult doing work/chores Not difficult at all Not difficult at all  Not difficult at all Somewhat difficult    phq 9 is negative  Fall Risk:    10/14/2023    9:36 AM 04/09/2023    1:57 PM 04/09/2023    8:48 AM 04/05/2023    7:52 PM 09/12/2022   11:16 AM  Fall Risk   Falls in the past year? 0 1 0 0 1  Number falls in past yr: 0 0 0 0 0  Injury with Fall? 0 0 0 0 0  Risk for fall due to : No Fall Risks History of fall(s)   No Fall Risks  Follow up Falls evaluation completed Falls  prevention discussed;Falls evaluation completed   Falls prevention discussed      Assessment & Plan Active estrogen receptor positive left breast cancer, post-lumpectomy Post-lumpectomy with estrogen receptor positivity, managed with letrozole . Mammograms current. - Continue letrozole  2.5 mg daily. - Follow up with oncologist Dr. Melanee in December. - Ensure mammograms are up to date.  Thrombocytopenia and neutropenia Chronic mild neutropenia and thrombocytopenia, monitored by oncologist. Current white count 3.5. - Continue monitoring blood counts with oncologist.  Age-related osteoporosis High fracture risk with T-score of -3.1 at femoral neck. On alendronate , advised on calcium  and vitamin D . - Continue alendronate . - Maintain high calcium  diet and vitamin D  supplementation.  Hypothyroidism Well-managed on levothyroxine . Last TSH normal. - Continue levothyroxine  75 mcg daily.  Vitamin D  deficiency and deficiency of B group vitamins Effective supplementation regimen with vitamin D  levels in 70s. - Continue current vitamin D  and B12 supplementation.  Depression Major depressive disorder recurrent chronic  well-managed on current medication regimen. - Continue Wellbutrin  SR 150 mg twice daily. - Continue citalopram  40 mg daily.  Hyperlipidemia Managed with atorvastatin . - Continue atorvastatin .  Migraine headaches Infrequent migraines managed with Excedrin Migraine.  No longer requires Ubrelvy . - Use Excedrin Migraine as needed.  Palmar fascial fibromatosis (Dupuytren's contracture) Right hand contracture with decreased strength. Previous injections by Dr. Soro. - Consider assistive devices for jar opening.  Disorder of skin and subcutaneous tissue Seborrheic keratosis and dry spot on chest. No suspicious moles. Dermatology referral requested. - Refer to dermatology for skin examination.  Personal history of transient ischemic attack (TIA) and cerebral infarction without  residual deficits with mild cognitive dysfunction History of TIA and strokes, on statin and aspirin . Mild cognitive dysfunction noted. - Continue statin and aspirin . - Follow up with neurologist Dr. Loreli as needed.  General Health Maintenance Flu vaccination discussed and agreed. - Administer flu shot today.  Follow-up Next follow-up visit scheduled in six months. - Schedule follow-up visit in six months.

## 2023-12-08 DIAGNOSIS — Z961 Presence of intraocular lens: Secondary | ICD-10-CM | POA: Diagnosis not present

## 2023-12-15 ENCOUNTER — Encounter: Payer: Self-pay | Admitting: Gynecologic Oncology

## 2023-12-16 ENCOUNTER — Ambulatory Visit: Admitting: Family Medicine

## 2023-12-16 ENCOUNTER — Encounter: Payer: Self-pay | Admitting: Family Medicine

## 2023-12-16 VITALS — BP 118/74 | HR 84 | Temp 97.9°F | Resp 16 | Ht 66.1 in | Wt 151.8 lb

## 2023-12-16 DIAGNOSIS — Z95811 Presence of heart assist device: Secondary | ICD-10-CM | POA: Diagnosis not present

## 2023-12-16 DIAGNOSIS — R058 Other specified cough: Secondary | ICD-10-CM

## 2023-12-16 DIAGNOSIS — J069 Acute upper respiratory infection, unspecified: Secondary | ICD-10-CM

## 2023-12-16 MED ORDER — BENZONATATE 100 MG PO CAPS: 100.0000 mg | ORAL_CAPSULE | Freq: Three times a day (TID) | ORAL | 0 refills | Status: DC | PRN

## 2023-12-16 MED ORDER — AZITHROMYCIN 250 MG PO TABS: ORAL_TABLET | ORAL | 0 refills | Status: AC

## 2023-12-16 NOTE — Progress Notes (Signed)
 Name: Natalie [name]   MRN: [id number]    DOB: 1950-04-22   Date:12/16/2023       Progress Note  Subjective  Chief Complaint  Chief Complaint  Patient presents with   Cough   Nasal Congestion    Sx since past thursday   Fatigue    Discussed the use of AI scribe software for clinical note transcription with the patient, who gave verbal consent to proceed.  History of Present Illness Natalie [name] is a 73 year old female who presents with worsening cough and fatigue.  She has been experiencing a cough that began last Thursday, initially accompanied by a sore throat and rhinorrhea. The cough has persisted and worsens with prolonged talking. She describes her throat as 'rough' and experiences occasional dyspnea. Initially, her symptoms seemed to improve but have worsened over the past five days.  She has not been tested for COVID-19 and has not taken any over-the-counter medications. Her most troubling symptoms are fatigue and a persistent cough. She feels extremely tired, stating, 'I feel like I could sleep for a year.'  She has not eaten today due to a lack of appetite and feeling too tired to prepare food. She was similarly fatigued the previous night.  She reports coughing up green sputum, although not recently. She denies that the symptoms are localized to her chest, describing them as more widespread. She has not taken any antibiotics previously for this condition.    Patient Active Problem List   Diagnosis Date Noted   Other neutropenia 10/14/2023   History of colonic polyps 04/16/2023   TIA on medication 09/12/2022   B12 deficiency 09/12/2022   History of malignant neoplasm of endometrium 09/12/2022   Trigger finger, right ring finger 09/10/2021   Dupuytren's contracture of both hands 09/10/2021   Vitamin D  deficiency 09/10/2021   Malignant neoplasm of left breast in female, estrogen receptor positive (HCC) 09/01/2021   Dyslipidemia 05/16/2021   Major depression,  recurrent, chronic 03/13/2021   Endometrial cancer (HCC) 12/26/2020   Postoperative anemia due to acute blood loss 12/19/2020   History of CVA (cerebrovascular accident) 04/19/2020   History of ischemic multifocal posterior circulation stroke 02/29/2020   Thrombocytopenia 02/28/2020   Difficulty hearing 06/28/2015   Arthritis, degenerative 06/28/2015   Purpura, nonthrombopenic 06/28/2015   Adult hypothyroidism 08/09/2014   Migraine without aura and without status migrainosus, not intractable 09/19/2008   OP (osteoporosis) 09/07/2006    Social History   Tobacco Use   Smoking status: Former    Current packs/day: 0.00    Average packs/day: 0.5 packs/day for 6.0 years (3.0 ttl pk-yrs)    Types: Cigarettes    Start date: 02/12/1968    Quit date: 02/11/1974    Years since quitting: 49.8   Smokeless tobacco: Never  Substance Use Topics   Alcohol use: Yes    Comment: rare     Current Outpatient Medications:    alendronate  (FOSAMAX ) 70 MG tablet, TAKE 1 TABLET(70 MG) BY MOUTH 1 TIME A WEEK WITH A FULL GLASS OF WATER  AND ON AN EMPTY STOMACH, Disp: 12 tablet, Rfl: 0   aspirin  81 MG EC tablet, Take by mouth., Disp: , Rfl:    aspirin -acetaminophen -caffeine (EXCEDRIN MIGRAINE) 250-250-65 MG tablet, Take by mouth every 6 (six) hours as needed for headache or migraine., Disp: , Rfl:    atorvastatin  (LIPITOR) 40 MG tablet, Take 1 tablet (40 mg total) by mouth daily., Disp: 90 tablet, Rfl: 1   buPROPion  (WELLBUTRIN  SR) 150 MG  12 hr tablet, Take 1 tablet (150 mg total) by mouth 2 (two) times daily., Disp: 180 tablet, Rfl: 1   calcium  carbonate (OSCAL) 1500 (600 Ca) MG TABS tablet, Take 600 mg of elemental calcium  by mouth 1 day or 1 dose., Disp: , Rfl:    cholecalciferol (VITAMIN D3) 25 MCG (1000 UNIT) tablet, Take 1,000 Units by mouth daily., Disp: , Rfl:    citalopram  (CELEXA ) 40 MG tablet, Take 1 tablet (40 mg total) by mouth daily., Disp: 90 tablet, Rfl: 1   Cyanocobalamin  (VITAMIN B-12) 1000  MCG SUBL, Place 1 tablet (1,000 mcg total) under the tongue 2 (two) times a week., Disp: 30 tablet, Rfl: 0   letrozole  (FEMARA ) 2.5 MG tablet, TAKE 1 TABLET(2.5 MG) BY MOUTH DAILY, Disp: 90 tablet, Rfl: 1   levothyroxine  (SYNTHROID ) 75 MCG tablet, Take 1 tablet (75 mcg total) by mouth daily before breakfast., Disp: 90 tablet, Rfl: 1   Multiple Vitamin (MULTIVITAMIN WITH MINERALS) TABS tablet, Take 1 tablet by mouth daily., Disp: , Rfl:    Ubrogepant  (UBRELVY ) 100 MG TABS, Take 100 mg by mouth daily as needed (migraines)., Disp: , Rfl:   Allergies  Allergen Reactions   Sulfamethoxazole-Trimethoprim Hives   Tnkase  [Tenecteplase ] Swelling    Mild focal left upper lip swelling    ROS  Ten systems reviewed and is negative except as mentioned in HPI    Objective  Vitals:   12/16/23 1409  BP: 118/74  Pulse: 84  Resp: 16  Temp: 97.9 F (36.6 C)  TempSrc: Oral  SpO2: 97%  Weight: 151 lb 12.8 oz (68.9 kg)  Height: 5' 6.1 (1.679 m)    Body mass index is 24.43 kg/m.  Physical Exam CONSTITUTIONAL: Patient appears well-developed and well-nourished. No distress. HEENT: Head atraumatic, normocephalic, neck supple. Throat red with irritation. CARDIOVASCULAR: Normal rate, regular rhythm and normal heart sounds. No murmur heard. No BLE edema. PULMONARY: Effort normal and breath sounds normal. Lungs clear to auscultation. No respiratory distress. ABDOMINAL: There is no tenderness or distention. MUSCULOSKELETAL: Normal gait. Without gross motor or sensory deficit. PSYCHIATRIC: Patient has a normal mood and affect. Behavior is normal. Judgment and thought content normal.   Assessment & Plan Acute upper respiratory infection with productive cough Symptoms suggest viral etiology; bacterial bronchitis considered but pneumonia unlikely due to clear lung sounds. - Prescribed Tessalon  Perles for cough suppression. - Advised hydration with fluids. - Prescribed Z-Pak (azithromycin ) if symptoms  persist, indicating possible bacterial bronchitis. - Sent prescriptions to [facility] for pickup. - Advised against COVID or flu testing. - Instructed to monitor symptoms and consider chest x-ray if no improvement.   She has a heart assistance device -Monitored by cardiologist -no palpitations

## 2023-12-18 ENCOUNTER — Encounter: Payer: Self-pay | Admitting: Gynecologic Oncology

## 2023-12-18 ENCOUNTER — Inpatient Hospital Stay: Payer: Medicare Other | Attending: Gynecologic Oncology | Admitting: Gynecologic Oncology

## 2023-12-18 VITALS — BP 138/81 | HR 66 | Temp 99.2°F | Resp 19 | Wt 154.0 lb

## 2023-12-18 DIAGNOSIS — Z923 Personal history of irradiation: Secondary | ICD-10-CM | POA: Diagnosis not present

## 2023-12-18 DIAGNOSIS — Z8542 Personal history of malignant neoplasm of other parts of uterus: Secondary | ICD-10-CM | POA: Insufficient documentation

## 2023-12-18 DIAGNOSIS — Z90710 Acquired absence of both cervix and uterus: Secondary | ICD-10-CM | POA: Insufficient documentation

## 2023-12-18 DIAGNOSIS — Z90722 Acquired absence of ovaries, bilateral: Secondary | ICD-10-CM | POA: Diagnosis not present

## 2023-12-18 DIAGNOSIS — C541 Malignant neoplasm of endometrium: Secondary | ICD-10-CM

## 2023-12-18 DIAGNOSIS — Z853 Personal history of malignant neoplasm of breast: Secondary | ICD-10-CM | POA: Insufficient documentation

## 2023-12-18 DIAGNOSIS — Z9079 Acquired absence of other genital organ(s): Secondary | ICD-10-CM | POA: Insufficient documentation

## 2023-12-18 DIAGNOSIS — R3915 Urgency of urination: Secondary | ICD-10-CM | POA: Insufficient documentation

## 2023-12-18 NOTE — Patient Instructions (Signed)
 It was good to see you today.  I do not see or feel any evidence of cancer recurrence on your exam.  We will see you for follow-up in 12 months.  As always, if you develop any new and concerning symptoms before your next visit, please call to see me sooner.

## 2023-12-18 NOTE — Progress Notes (Signed)
 Gynecologic Oncology Return Clinic Visit  12/18/23  Reason for Visit: surveillance  Treatment History: Oncology History Overview Note  MMR IHC normal   Endometrial cancer (HCC)  04/2020 Imaging   CT A/P in 04/2020 showed enlarged fibroid uterus; complex cystic mass in left ovary measuring up to 5cm. Pelvic ultrasound 05/01/20: Enlarged multi-fibroid uterus, endometrium 3.15mm. Left ovary measures up to 7cm with a 5.8 x 4.1 x 4cm dominant septated cyst.  CA-125   04/2020 Tumor Marker   Patient's tumor was tested for the following markers: CA-125. Results of the tumor marker test revealed 592.   08/10/2020 Imaging   Pelvic ultrasound showed thickened heterogenous endometrial complex with somewhat nodular margins associated with a 3.8 cm diameter fluid collection within the endometrial canal.  8.2 cm cyst of the left ovary.   08/2020 Tumor Marker   Patient's tumor was tested for the following markers: CA-125. Results of the tumor marker test revealed 731.   08/27/2020 Initial Biopsy   EMB showing minute fragments of benign inactive endometrium, mostly blood and fibrin   11/01/2020 Surgery   D&C hysteroscopy, and hysteroscopic sampling of the endometrium.    Final pathology showed at least complex atypical hyperplasia.   11/29/2020 Imaging   Pelvic ultrasound shows thickened endometrial lining measuring 4.4 cm.  Normal-appearing right ovary.  Complex left adnexal cyst measures up to f 5.3 cm.  No flow seen   12/18/2020 Surgery   TRH/BSO, SLN injection with biopsy on let (no mapping on right), mini-lap for specimen removal, repair vaginal lacerations  Findings: On EUA, large globular uterus. On intra-abdominal entry, normal upper abdominal survey.Normal omentum, small and large bowel. Uterus 10cm, very globular and bulbous at the fundus. Multiple fibroids including 3cm lower uterine segment anterior fibroid and posterior 6cm fundal fibroid. Right adnexa normal and atrophic. Mapping to  channels along the right broad ligament, no obvious SLN. On the left, mapping successful to external iliac SLN. Left ovary replaced by 6cm cyst adherent to the broad ligament and sigmoid mesentery with old blood versus necrotic tissue within (cyst wall very easily ripped open with minimal manipulation of the adnexa. Second 2cm cystic component. No obvious findings of malignancy but would be stage II if cancer given adherence to surrounding pelvic structures. Some adhesions between the bladder and cervix. No obvious adenopathy. No ascites. Mini-lap required for specimen removal given size.  No frozen sent as it was not going to impact management. Goal was to keep the surgery under or close to 2 hours.   12/18/2020 Pathology Results   A. LYMPH NODE, LEFT EXTERNAL ILIAC SENTINAL, BIOPSY:  - One lymph node, negative for malignancy (0/1).   B. UTERUS, CERVIX,  BILATERAL TUBES, HYSTERECTOMY AND BILATERAL  SALPINGECTOMY:  - Endometrium:       - Endometrioid endometrial adenocarcinoma, low-grade, arising in a  background of endometrioid intraepithelial neoplasia (EIN), with  extensive colonization of adenomyosis.       - See oncology table.  - Myometrium:       - Adenomyosis, involved by endometrioid endometrial adenocarcinoma.       - Leiomyomata uteri.  - Uterine cervix:       - Benign transformation zone.       - Negative for squamous intraepithelial lesion and malignancy.  - Fallopian tubes:       - No significant histopathologic change.  - Ovaries:       - Benign physiologic changes.   COMMENT:   A. An immunohistochemical study for pancytokeratin is  negative. There is  no evidence of metastatic carcinoma.   B. Immunohistochemical studies show the tumor cells to be positive for  PR, and negative for Napsin A.  These findings would favor the above  diagnosis, and suggest against clear cell carcinoma. The definite extent  of myometrial invasion is less than 50%, but cannot be further   characterized, due to initial sampling of the entire myometrial surface,  and extensive colonization of adenomyosis throughout the myometrium  impacting evaluation of the orientation of these fragments. Full  thickness sections are without definite direct myometrial invasion, but  show extensive adenomyosis   UTERUS, CARCINOMA OR CARCINOSARCOMA: Resection   Procedure: Total hysterectomy with bilateral salpingo-oophorectomy  Histologic Type: Endometrioid carcinoma, NOS  Histologic Grade: Low-grade  Myometrial Invasion:       Cannot be determined  Uterine Serosa Involvement: Not identified  Cervical stromal Involvement: Not identified  Extent of involvement of other tissue/organs: Not identified  Peritoneal/Ascitic Fluid: Not submitted/unknown  Lymphovascular Invasion: Not identified  Regional Lymph Nodes: Regional lymph nodes present  All regional lymph nodes negative for tumor cells       Pelvic Lymph Nodes Examined:  Sentinel: 1               Non-sentinel: 0               Total: 1       Pelvic Lymph Nodes with Metastasis: 0                          Macrometastasis: (>2.0 mm): 0                          Micrometastasis: (>0.2 mm and < 2.0 mm): 0                          Isolated Tumor Cells (<0.2 mm): 0                          Laterality of Lymph Node with Tumor: Not  applicable                             Extracapsular Extension: Not applicable    12/26/2020 Initial Diagnosis   Endometrial cancer (HCC)   02/11/2021 - 03/14/2021 Radiation Therapy   02/11/2021 through 03/14/2021 Site Technique Total Dose (Gy) Dose per Fx (Gy) Completed Fx Beam Energies  Vagina: Pelvis HDR-brachy 30/30 6 5/5 Ir-192       Malignant neoplasm of left breast in female, estrogen receptor positive (HCC)  09/01/2021 Initial Diagnosis   Malignant neoplasm of left breast in female, estrogen receptor positive (HCC)   10/02/2021 Cancer Staging   Staging form: Breast, AJCC 8th Edition - Pathologic stage  from 10/02/2021: Stage IA (pT1a, pN0, cM0, G1, ER+, PR+, HER2-) - Signed by Melanee Annah BROCKS, MD on 10/02/2021 Multigene prognostic tests performed: None Histologic grading system: 3 grade system     Interval History: Overall doing well although has been sick recently.  Cough has gotten better.  Denies any vaginal bleeding.  Reports stable bowel function, describes having regular bowel movements.  Has episodes where she will have urinary frequency or urgency, then improved.  Past Medical/Surgical History: Past Medical History:  Diagnosis Date   Acute bronchospasm due to viral infection 2020   Due  to pollen   Acute ischemic left MCA stroke (HCC) 04/19/2020   a.) CTA head/neck 04/19/2020 --> nearly occlusive thrombus at bifurcation of a proximal M2 MCA branch approximately 6 mm from the MCA bifurcation. Subsequent occlusion of a proximal left M3 MCA branch   Allergy    Anemia    Angina at rest    Blood transfusion without reported diagnosis 12/18/2020   Breast cancer, left (HCC) 08/21/2021   a.) stereotactic Bx 08/21/2021 --> IMC (G1, ER/PR +, Her2/neu -)   Cataract    a.) s/p extraction with IOL placement   Depressive disorder    Diverticulosis    Dyspnea    Epistaxis    Fatigue    Hearing loss    History of 2019 novel coronavirus disease (COVID-19) 12/18/2020   History of ischemic multifocal posterior circulation stroke 02/28/2020   a.) presented to ED with increasing episodes of  transient vertical binocular diplopia; MRI 02/27/2021 --> multiple punctate acute to subacute ischemic infarcts involving the cortical/subcortical aspects of both parieto-occipital regions   History of loop recorder    History of radiation therapy    Vagina-02/11/21-03/14/21- Dr. Lynwood Nasuti   HLD (hyperlipidemia)    Hypothyroidism    LAFB (left anterior fascicular block)    Long term current use of antithrombotics/antiplatelets    a.) on DAPT therapy (ASA + clopidogrel )   Memory change    Migraine     Osteoarthritis    Osteoporosis    Palpitations    Personal history of radiation therapy    Premature menopause    QT prolongation    Seizures (HCC) 1994   history of mini seizures, possible migraine induced   Sleep apnea 1/24   Stage I adenocarcinoma of endometrium (HCC) 12/18/2020   a.) stage 1A; grade 1 (pT1a, pN0); b.) s/p TLH/BSO 12/18/2020 + adjuvant vaginal brachytherapy (30 Gy over 5 fractions between 02/11/2021 - 03/14/2021)   Vitamin D  deficiency     Past Surgical History:  Procedure Laterality Date   ABDOMINAL HYSTERECTOMY  12/18/2020   ABLATION  2006   AXILLARY SENTINEL NODE BIOPSY Left 09/25/2021   Procedure: AXILLARY SENTINEL NODE BIOPSY;  Surgeon: Dessa Reyes ORN, MD;  Location: [facility] ORS;  Service: General;  Laterality: Left;   BREAST BIOPSY Left 08/21/2021   Stereo Bx, X-clip; path --> IMC (G1, ER/PR +, Her2/neu -)   BREAST LUMPECTOMY Left 09/25/2021   BREAST LUMPECTOMY WITH NEEDLE LOCALIZATION Left 09/25/2021   Procedure: BREAST LUMPECTOMY WITH NEEDLE LOCALIZATION;  Surgeon: Dessa Reyes ORN, MD;  Location: [facility] ORS;  Service: General;  Laterality: Left;   BREAST SURGERY  July 2023   BUBBLE STUDY  04/23/2020   Procedure: BUBBLE STUDY;  Surgeon: Rolan Ezra RAMAN, MD;  Location: [facility] ENDOSCOPY;  Service: Cardiovascular;;   CATARACT EXTRACTION W/ INTRAOCULAR LENS IMPLANT Bilateral    COLONOSCOPY     COLONOSCOPY WITH PROPOFOL  N/A 12/22/2018   Procedure: COLONOSCOPY WITH PROPOFOL ;  Surgeon: Janalyn Keene NOVAK, MD;  Location: [facility] ENDOSCOPY;  Service: Endoscopy;  Laterality: N/A;   COLONOSCOPY WITH PROPOFOL  N/A 04/16/2023   Procedure: COLONOSCOPY WITH PROPOFOL ;  Surgeon: Unk Corinn Skiff, MD;  Location: [facility] ENDOSCOPY;  Service: Gastroenterology;  Laterality: N/A;   EYE SURGERY  Spring 2018   Cataracts both eyes   HYSTEROSCOPY WITH D & C N/A 11/01/2020   Procedure: DILATATION AND CURETTAGE /HYSTEROSCOPY WITH MYOSURE;  Surgeon: Viktoria Comer SAUNDERS, MD;   Location: [facility] ORS;  Service: Gynecology;  Laterality: N/A;   LAPAROTOMY  12/18/2020  Procedure: LAPAROTOMY;  Surgeon: Viktoria Comer SAUNDERS, MD;  Location: [facility] ORS;  Service: Gynecology;;   LOOP RECORDER INSERTION N/A 04/23/2020   Procedure: LOOP RECORDER INSERTION;  Surgeon: Waddell Danelle ORN, MD;  Location: [facility] INVASIVE CV LAB;  Service: Cardiovascular;  Laterality: N/A;   ROBOTIC ASSISTED TOTAL HYSTERECTOMY WITH BILATERAL SALPINGO OOPHERECTOMY Bilateral 12/18/2020   Procedure: XI ROBOTIC ASSISTED TOTAL HYSTERECTOMY WITH BILATERAL SALPINGO OOPHORECTOMY;  Surgeon: Viktoria Comer SAUNDERS, MD;  Location: [facility] ORS;  Service: Gynecology;  Laterality: Bilateral;   SENTINEL NODE BIOPSY N/A 12/18/2020   Procedure: SENTINEL NODE BIOPSY;  Surgeon: Viktoria Comer SAUNDERS, MD;  Location: [facility] ORS;  Service: Gynecology;  Laterality: N/A;   TEE WITHOUT CARDIOVERSION N/A 04/23/2020   Procedure: TRANSESOPHAGEAL ECHOCARDIOGRAM (TEE);  Surgeon: Rolan Ezra RAMAN, MD;  Location: [facility] ENDOSCOPY;  Service: Cardiovascular;  Laterality: N/A;   TONSILLECTOMY      Family History  Problem Relation Age of Onset   Early death Father        suicide   Depression Father    Diabetes Father    Cancer Father        prostate   Hearing loss Father        due to war   Alzheimer's disease Mother    Heart disease Brother    Atrial fibrillation Brother    Heart disease Maternal Aunt    Dementia Maternal Aunt    Heart disease Maternal Uncle    Dementia Maternal Grandmother    Arthritis Maternal Grandmother    Heart disease Brother    Atrial fibrillation Brother    Colon cancer Neg Hx    Breast cancer Neg Hx    Ovarian cancer Neg Hx    Pancreatic cancer Neg Hx    Endometrial cancer Neg Hx     Social History   Socioeconomic History   Marital status: Single    Spouse name: Not on file   Number of children: 1   Years of education: Not on file   Highest education level: Professional school degree (e.g., MD, DDS, DVM, JD)   Occupational History   Occupation: retired   Tobacco Use   Smoking status: Former    Current packs/day: 0.00    Average packs/day: 0.5 packs/day for 6.0 years (3.0 ttl pk-yrs)    Types: Cigarettes    Start date: 02/12/1968    Quit date: 02/11/1974    Years since quitting: 49.8   Smokeless tobacco: Never  Vaping Use   Vaping status: Never Used  Substance and Sexual Activity   Alcohol use: Yes    Comment: rare   Drug use: Never   Sexual activity: Not Currently    Comment: Don't use not sexually active  Other Topics Concern   Not on file  Social History Narrative   Raised an adopted child on her own   Working part time reviewed documents   Social Drivers of Corporate Investment Banker Strain: Low Risk  (10/10/2023)   Overall Financial Resource Strain (CARDIA)    Difficulty of Paying Living Expenses: Not hard at all  Food Insecurity: No Food Insecurity (10/10/2023)   Hunger Vital Sign    Worried About Running Out of Food in the Last Year: Never true    Ran Out of Food in the Last Year: Never true  Transportation Needs: No Transportation Needs (10/10/2023)   PRAPARE - Administrator, Civil Service (Medical): No    Lack of Transportation (Non-Medical): No  Physical Activity: Insufficiently  Active (10/10/2023)   Exercise Vital Sign    Days of Exercise per Week: 2 days    Minutes of Exercise per Session: 20 min  Stress: No Stress Concern Present (10/10/2023)   [employer] of Occupational Health - Occupational Stress Questionnaire    Feeling of Stress: Not at all  Social Connections: Moderately Integrated (10/10/2023)   Social Connection and Isolation Panel    Frequency of Communication with Friends and Family: More than three times a week    Frequency of Social Gatherings with Friends and Family: Twice a week    Attends Religious Services: More than 4 times per year    Active Member of [employer] or Organizations: Yes    Attends Engineer, Structural: More than 4  times per year    Marital Status: Never married    Current Medications:  Current Outpatient Medications:    alendronate  (FOSAMAX ) 70 MG tablet, TAKE 1 TABLET(70 MG) BY MOUTH 1 TIME A WEEK WITH A FULL GLASS OF WATER  AND ON AN EMPTY STOMACH, Disp: 12 tablet, Rfl: 0   aspirin  81 MG EC tablet, Take by mouth., Disp: , Rfl:    aspirin -acetaminophen -caffeine (EXCEDRIN MIGRAINE) 250-250-65 MG tablet, Take by mouth every 6 (six) hours as needed for headache or migraine., Disp: , Rfl:    atorvastatin  (LIPITOR) 40 MG tablet, Take 1 tablet (40 mg total) by mouth daily., Disp: 90 tablet, Rfl: 1   azithromycin  (ZITHROMAX ) 250 MG tablet, Take 2 tablets on day 1, then 1 tablet daily on days 2 through 5, Disp: 6 tablet, Rfl: 0   benzonatate  (TESSALON ) 100 MG capsule, Take 1 capsule (100 mg total) by mouth 3 (three) times daily as needed for cough., Disp: 40 capsule, Rfl: 0   buPROPion  (WELLBUTRIN  SR) 150 MG 12 hr tablet, Take 1 tablet (150 mg total) by mouth 2 (two) times daily., Disp: 180 tablet, Rfl: 1   calcium  carbonate (OSCAL) 1500 (600 Ca) MG TABS tablet, Take 600 mg of elemental calcium  by mouth 1 day or 1 dose., Disp: , Rfl:    cholecalciferol (VITAMIN D3) 25 MCG (1000 UNIT) tablet, Take 1,000 Units by mouth daily., Disp: , Rfl:    citalopram  (CELEXA ) 40 MG tablet, Take 1 tablet (40 mg total) by mouth daily., Disp: 90 tablet, Rfl: 1   Cyanocobalamin  (VITAMIN B-12) 1000 MCG SUBL, Place 1 tablet (1,000 mcg total) under the tongue 2 (two) times a week., Disp: 30 tablet, Rfl: 0   letrozole  (FEMARA ) 2.5 MG tablet, TAKE 1 TABLET(2.5 MG) BY MOUTH DAILY, Disp: 90 tablet, Rfl: 1   levothyroxine  (SYNTHROID ) 75 MCG tablet, Take 1 tablet (75 mcg total) by mouth daily before breakfast., Disp: 90 tablet, Rfl: 1   Multiple Vitamin (MULTIVITAMIN WITH MINERALS) TABS tablet, Take 1 tablet by mouth daily., Disp: , Rfl:    Ubrogepant  (UBRELVY ) 100 MG TABS, Take 100 mg by mouth daily as needed (migraines)., Disp: , Rfl:    Review of Systems: Denies appetite changes, fevers, chills, fatigue, unexplained weight changes. Denies hearing loss, neck lumps or masses, mouth sores, ringing in ears or voice changes. Denies chest pain or palpitations. Denies leg swelling. Denies abdominal distention, pain, blood in stools, constipation, diarrhea, nausea, vomiting, or early satiety. Denies pain with intercourse, dysuria, frequency, hematuria or incontinence. Denies hot flashes, pelvic pain, vaginal bleeding or vaginal discharge.   Denies joint pain, back pain or muscle pain/cramps. Denies itching, rash, or wounds. Denies dizziness, headaches, numbness or seizures. Denies swollen lymph nodes or glands,  denies easy bruising or bleeding. Denies anxiety, depression, confusion, or decreased concentration.  Physical Exam: BP 138/81 (BP Location: Right Arm, Patient Position: Sitting)   Pulse 66   Temp 99.2 F (37.3 C) (Oral)   Resp 19   Wt 154 lb (69.9 kg)   SpO2 94%   BMI 24.78 kg/m  General: Alert, oriented, no acute distress. HEENT: Normocephalic, atraumatic, sclera anicteric. Chest: Clear to auscultation bilaterally.  No wheezes or rhonchi. Cardiovascular: Regular rate and rhythm, no murmurs. Abdomen: soft, nontender.  Normoactive bowel sounds.  No masses or hepatosplenomegaly appreciated.  Well-healed incisions. Extremities: Grossly normal range of motion.  Warm, well perfused.  No edema bilaterally. Skin: No rashes or lesions noted. Lymphatics: No cervical, supraclavicular, or inguinal adenopathy. GU: Normal appearing external genitalia without erythema, excoriation, or lesions.  Speculum exam reveals moderately atrophic vaginal mucosa, some radiation changes present.  Introitus itself was quite narrow at the apex.  Minimal agglutination towards the apex of the vagina.  Bimanual exam reveals cuff is smooth with, no nodularity or masses.  Rectovaginal exam confirms findings.   Laboratory & Radiologic  Studies: None new  Assessment & Plan: Natalie [name] is a 73 y.o. woman with a history of Stage 1A grade 1 endometrioid endometrial adenocarcinoma, lymph node assessment deferred.  MMR intact, MSS. Difficulty in determining invasion (estimated at <50%) and size of tumor given adenomyosis. No LVI. Given large area of myometrium involved, tumor size suspected to be larger.  Adjuvant brachytherapy was recommended which was completed in February 2023.   Patient is overall doing well and is NED on exam today. Vaginal dilator use encouraged given narrowing and agglutination at the apex.   Per NCCN surveillance recommendations, we will continue with visits every 6 months alternating between our clinic and radiation oncology.  We will see her back in 12 months.   Offered again referral for her urinary symptoms.  At this point, given intermittent symptoms and not being very bothered by these, she would like to hold off on referral.  I have asked her to contact me if she would like me to place a referral to urogynecology.   We reviewed signs and symptoms that would be concerning for cancer recurrence, and I encouraged the patient to call if she develops any of these.  20 minutes of total time was spent for this patient encounter, including preparation, face-to-face counseling with the patient and coordination of care, and documentation of the encounter.  Comer Dollar, MD  Division of Gynecologic Oncology  Department of Obstetrics and Gynecology  [facility]

## 2023-12-21 ENCOUNTER — Telehealth: Payer: Self-pay

## 2023-12-21 ENCOUNTER — Telehealth: Payer: Self-pay | Admitting: *Deleted

## 2023-12-21 NOTE — Telephone Encounter (Signed)
 Spoke with Natalie [name] who called the office stating she spoke with Dr. Viktoria on Friday at her visit and they spoke about medicine for her urinary symptoms? Pt states she would like to try the medicine. Advised patient that per Dr. Lewie note a referral to uro-gynecology was mentioned. Pt states she doesn't want the referral only the medicine they discussed.  Advised patient her message will be relayed to provider and the office will call back.

## 2023-12-21 NOTE — Telephone Encounter (Signed)
 Copied from CRM [id number]. Topic: Clinical - Medical Advice >> Dec 21, 2023  3:25 PM Ivette P wrote: Reason for CRM: Pt went to oncologist Friday and mentioned that she goes to the bathroom frequently and is going on a trip soon and does not want to go to the bathroom frequently on the plane.  Pt was told by oncologist she needs a referral to a urologist.   Pt would like to  know if something can be prescribed or what options are available.   Pls follow up with pt  [phone]

## 2023-12-21 NOTE — Telephone Encounter (Signed)
 Spoke with Ms. Rorke and relayed message that Dr. Viktoria offered patient a referral to urogyn. If interested in medication for urticaria symptoms then the plan would be to see urogyn. Pt states she misunderstood, and was interested in medication because she is going to [place] in three weeks and was hoping for some relief from her symptoms?  Pt agreed to referral and advised patient that urogyn office will call her for an appointment and it may not be before her trip. Pt verbalized understanding and thanked the office.

## 2023-12-22 NOTE — Telephone Encounter (Signed)
 Appt sch'd for Friday

## 2023-12-25 ENCOUNTER — Ambulatory Visit (INDEPENDENT_AMBULATORY_CARE_PROVIDER_SITE_OTHER): Admitting: Family Medicine

## 2023-12-25 ENCOUNTER — Encounter: Payer: Self-pay | Admitting: Family Medicine

## 2023-12-25 VITALS — BP 118/74 | HR 77 | Resp 16 | Ht 66.1 in | Wt 154.0 lb

## 2023-12-25 DIAGNOSIS — R351 Nocturia: Secondary | ICD-10-CM | POA: Diagnosis not present

## 2023-12-25 DIAGNOSIS — N3941 Urge incontinence: Secondary | ICD-10-CM | POA: Diagnosis not present

## 2023-12-25 MED ORDER — SOLIFENACIN SUCCINATE 10 MG PO TABS: 10.0000 mg | ORAL_TABLET | Freq: Every day | ORAL | 0 refills | Status: AC

## 2023-12-25 NOTE — Progress Notes (Signed)
 Name: Natalie [name]   MRN: [id number]    DOB: May 03, 1950   Date:12/25/2023       Progress Note  Subjective  Chief Complaint  Chief Complaint  Patient presents with   Urinary Frequency    On going for years    Discussed the use of AI scribe software for clinical note transcription with the patient, who gave verbal consent to proceed.  History of Present Illness Natalie [name] is a 73 year old female with endometrial cancer who presents with nocturia.  She has a history of endometrial cancer treated with radiation therapy and is currently on letrozole . She has not received chemotherapy.  She experiences significant nocturia, waking up to urinate up to three times per night. This has been ongoing for over a year, with urgency that often does not result in significant urine output. The urgency is described as an 'urge' that could lead to incontinence if not addressed. Her symptoms are predominantly nocturnal, with less frequent urination during the day.  She has previously declined additional medications for nocturia but is now interested in managing her symptoms due to an upcoming overseas trip. Her nocturia is exacerbated by her kitten's activity at night, which further disrupts her sleep.  She drinks decaffeinated coffee, usually stopping by 2 or 3 PM, and prefers herbal teas. She has a preference for bland foods and a reduced meat intake. She is planning a river cruise in [place], which motivates her to address her nocturia.  No itching or burning reported. Reports nocturia with frequent awakenings at night to urinate.    Patient Active Problem List   Diagnosis Date Noted   Presence of heart assist device (HCC) 12/16/2023   Other neutropenia 10/14/2023   History of colonic polyps 04/16/2023   TIA on medication 09/12/2022   B12 deficiency 09/12/2022   History of malignant neoplasm of endometrium 09/12/2022   Trigger finger, right ring finger 09/10/2021   Dupuytren's  contracture of both hands 09/10/2021   Vitamin D  deficiency 09/10/2021   Malignant neoplasm of left breast in female, estrogen receptor positive (HCC) 09/01/2021   Dyslipidemia 05/16/2021   Major depression, recurrent, chronic 03/13/2021   Endometrial cancer (HCC) 12/26/2020   Postoperative anemia due to acute blood loss 12/19/2020   History of CVA (cerebrovascular accident) 04/19/2020   History of ischemic multifocal posterior circulation stroke 02/29/2020   Thrombocytopenia 02/28/2020   Difficulty hearing 06/28/2015   Arthritis, degenerative 06/28/2015   Purpura, nonthrombopenic 06/28/2015   Adult hypothyroidism 08/09/2014   Migraine without aura and without status migrainosus, not intractable 09/19/2008   OP (osteoporosis) 09/07/2006    Social History   Tobacco Use   Smoking status: Former    Current packs/day: 0.00    Average packs/day: 0.5 packs/day for 6.0 years (3.0 ttl pk-yrs)    Types: Cigarettes    Start date: 02/12/1968    Quit date: 02/11/1974    Years since quitting: 49.9   Smokeless tobacco: Never  Substance Use Topics   Alcohol use: Yes    Comment: rare     Current Outpatient Medications:    alendronate  (FOSAMAX ) 70 MG tablet, TAKE 1 TABLET(70 MG) BY MOUTH 1 TIME A WEEK WITH A FULL GLASS OF WATER  AND ON AN EMPTY STOMACH, Disp: 12 tablet, Rfl: 0   aspirin  81 MG EC tablet, Take by mouth., Disp: , Rfl:    aspirin -acetaminophen -caffeine (EXCEDRIN MIGRAINE) 250-250-65 MG tablet, Take by mouth every 6 (six) hours as needed for headache or migraine., Disp: ,  Rfl:    atorvastatin  (LIPITOR) 40 MG tablet, Take 1 tablet (40 mg total) by mouth daily., Disp: 90 tablet, Rfl: 1   buPROPion  (WELLBUTRIN  SR) 150 MG 12 hr tablet, Take 1 tablet (150 mg total) by mouth 2 (two) times daily., Disp: 180 tablet, Rfl: 1   calcium  carbonate (OSCAL) 1500 (600 Ca) MG TABS tablet, Take 600 mg of elemental calcium  by mouth 1 day or 1 dose., Disp: , Rfl:    cholecalciferol (VITAMIN D3) 25 MCG  (1000 UNIT) tablet, Take 1,000 Units by mouth daily., Disp: , Rfl:    citalopram  (CELEXA ) 40 MG tablet, Take 1 tablet (40 mg total) by mouth daily., Disp: 90 tablet, Rfl: 1   Cyanocobalamin  (VITAMIN B-12) 1000 MCG SUBL, Place 1 tablet (1,000 mcg total) under the tongue 2 (two) times a week., Disp: 30 tablet, Rfl: 0   letrozole  (FEMARA ) 2.5 MG tablet, TAKE 1 TABLET(2.5 MG) BY MOUTH DAILY, Disp: 90 tablet, Rfl: 1   levothyroxine  (SYNTHROID ) 75 MCG tablet, Take 1 tablet (75 mcg total) by mouth daily before breakfast., Disp: 90 tablet, Rfl: 1   Multiple Vitamin (MULTIVITAMIN WITH MINERALS) TABS tablet, Take 1 tablet by mouth daily., Disp: , Rfl:    Ubrogepant  (UBRELVY ) 100 MG TABS, Take 100 mg by mouth daily as needed (migraines)., Disp: , Rfl:   Allergies  Allergen Reactions   Sulfamethoxazole-Trimethoprim Hives   Tnkase  [Tenecteplase ] Swelling    Mild focal left upper lip swelling    ROS  Ten systems reviewed and is negative except as mentioned in HPI    Objective  Vitals:   12/25/23 1118  BP: 118/74  Pulse: 77  Resp: 16  SpO2: 98%  Weight: 154 lb (69.9 kg)  Height: 5' 6.1 (1.679 m)    Body mass index is 24.78 kg/m.  Physical Exam CONSTITUTIONAL: Patient appears well-developed and well-nourished.  No distress. HEENT: Head atraumatic, normocephalic, neck supple. CARDIOVASCULAR: Normal rate, regular rhythm and normal heart sounds.  No murmur heard. No BLE edema. PULMONARY: Effort normal and breath sounds normal. No respiratory distress. ABDOMINAL: There is no tenderness or distention. Negative CVA tenderness  MUSCULOSKELETAL: Normal gait. Without gross motor or sensory deficit. PSYCHIATRIC: Patient has a normal mood and affect. behavior is normal. Judgment and thought content normal.  Assessment & Plan Nocturia and urge incontinence Chronic nocturia and urge incontinence, primarily nocturnal, exacerbated by caffeine and sleep disturbances. No UTI. Interested in medication  management for upcoming travel. - Prescribed VESIcare , low dose in the afternoon. - Advised to avoid caffeine and switch to herbal tea in the afternoon. - Instructed to monitor for side effects: dry mouth, constipation, cognitive dysfunction. - Advised trial of medication for a week before travel to assess tolerance. - Discussed potential need for prior authorization for VESIcare . - Advised to avoid spicy foods during travel.

## 2023-12-28 ENCOUNTER — Ambulatory Visit: Admitting: Radiation Oncology

## 2024-01-03 ENCOUNTER — Other Ambulatory Visit: Payer: Self-pay | Admitting: Nurse Practitioner

## 2024-01-11 ENCOUNTER — Encounter: Payer: Self-pay | Admitting: Oncology

## 2024-01-11 ENCOUNTER — Inpatient Hospital Stay: Attending: Gynecologic Oncology | Admitting: Oncology

## 2024-01-11 VITALS — BP 132/84 | HR 68 | Temp 97.6°F | Resp 19 | Wt 153.7 lb

## 2024-01-11 DIAGNOSIS — M810 Age-related osteoporosis without current pathological fracture: Secondary | ICD-10-CM | POA: Diagnosis not present

## 2024-01-11 DIAGNOSIS — Z8542 Personal history of malignant neoplasm of other parts of uterus: Secondary | ICD-10-CM | POA: Insufficient documentation

## 2024-01-11 DIAGNOSIS — Z170 Estrogen receptor positive status [ER+]: Secondary | ICD-10-CM | POA: Insufficient documentation

## 2024-01-11 DIAGNOSIS — Z87891 Personal history of nicotine dependence: Secondary | ICD-10-CM | POA: Insufficient documentation

## 2024-01-11 DIAGNOSIS — Z79811 Long term (current) use of aromatase inhibitors: Secondary | ICD-10-CM | POA: Diagnosis not present

## 2024-01-11 DIAGNOSIS — Z08 Encounter for follow-up examination after completed treatment for malignant neoplasm: Secondary | ICD-10-CM

## 2024-01-11 DIAGNOSIS — C50912 Malignant neoplasm of unspecified site of left female breast: Secondary | ICD-10-CM | POA: Diagnosis present

## 2024-01-11 NOTE — Progress Notes (Signed)
 Hematology/Oncology Consult note [facility]  [phone] [phone]  Patient Care Team: Sowles, Krichna, MD as PCP - General (Family Medicine) Shannon Agent, MD as Consulting Physician (Radiation Oncology) Maree Jannett POUR, MD as Consulting Physician (Neurology) Florencio Cara BIRCH, MD as Consulting Physician (Cardiology) Viktoria Comer SAUNDERS, MD as Consulting Physician (Gynecologic Oncology) Melanee Annah BROCKS, MD as Consulting Physician (Oncology)   Name of the patient: Natalie [name]  [id number]  23-Dec-1950   Date of visit: 01/11/24  Diagnosis- pathological prognostic 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  Diagnostic mammogram and ultrasound showed indeterminate left breast asymmetry as well as a probable benign right-sided breast mass.  Biopsy of both the masses was recommended.  However the second mass resolved at the time of biopsy likely consistent with fat necrosis from anticoagulate use.  Left breast mass biopsy showed invasive mammary carcinoma grade 1 ER greater than 90% positive, PR negative and HER2 negative.   Personal history of endometrial cancer in November 2022 s/p surgery and adjuvant radiation therapy.  Also had a posterior circulation stroke in January 2022 for which she is on dual antiplatelet therapy.   Final pathology showed a 5 mm tubular carcinoma grade 1 ER greater than 90% positive PR 10% positive and HER2 negative.  Negative margins.  3 sentinel lymph nodes negative for malignancy.  PT1AN0.  Patient has baseline osteoporosis.  Tamoxifen was not started given her history of TIAs and patient started taking  letrozole  in March 2024.      Interval history- Discussed the use of AI scribe software for clinical note transcription with the patient, who gave verbal consent to proceed.  History of Present Illness   Natalie [name] is a 73 year old female with breast cancer who presents for follow-up on letrozole  treatment.  She has been on letrozole  for breast cancer treatment since March 2024, totaling over a year and a half.  She is tolerating it well and denies any specific complaints at this time.  Denies any breast concerns.  No changes in her appetite or weight.  No new aches and pains anywhere  In addition to breast cancer, she has a history of osteoporosis. She manages this with daily calcium  supplementation and weekly Fosamax .      ECOG PS- 1 Pain scale- 0   Review of systems- Review of Systems  Constitutional:  Negative for chills, fever, malaise/fatigue and weight loss.  HENT:  Negative for congestion, ear discharge and nosebleeds.   Eyes:  Negative for blurred vision.  Respiratory:  Negative for cough, hemoptysis, sputum production, shortness of breath and wheezing.   Cardiovascular:  Negative for chest pain, palpitations, orthopnea and claudication.  Gastrointestinal:  Negative for abdominal pain, blood in stool, constipation, diarrhea, heartburn, melena, nausea and vomiting.  Genitourinary:  Negative for dysuria, flank pain, frequency, hematuria and urgency.  Musculoskeletal:  Negative for back pain, joint pain and myalgias.  Skin:  Negative for rash.  Neurological:  Negative for dizziness, tingling, focal weakness, seizures, weakness and headaches.  Endo/Heme/Allergies:  Does not bruise/bleed easily.  Psychiatric/Behavioral:  Negative for depression and suicidal ideas. The patient does not have insomnia.       Allergies  Allergen Reactions   Sulfamethoxazole-Trimethoprim Hives   Tnkase  [Tenecteplase ] Swelling    Mild focal left upper lip swelling     Past Medical  History:  Diagnosis Date   Acute bronchospasm due to viral infection 2020   Due to pollen   Acute ischemic left MCA stroke (HCC) 04/19/2020   a.) CTA head/neck 04/19/2020 --> nearly occlusive thrombus at bifurcation of a proximal M2 MCA branch approximately 6 mm from the MCA bifurcation. Subsequent occlusion of a proximal left M3 MCA branch   Allergy    Anemia    Angina at rest    Blood transfusion without reported diagnosis 12/18/2020   Breast cancer, left (HCC) 08/21/2021   a.) stereotactic Bx 08/21/2021 --> IMC (G1, ER/PR +, Her2/neu -)   Cataract    a.) s/p extraction with IOL placement   Depressive disorder    Diverticulosis    Dyspnea    Epistaxis    Fatigue    Hearing loss    History of 2019 novel coronavirus disease (COVID-19) 12/18/2020   History of ischemic multifocal posterior circulation stroke 02/28/2020   a.) presented to ED with increasing episodes of  transient vertical binocular diplopia; MRI 02/27/2021 --> multiple punctate acute to subacute ischemic infarcts involving the cortical/subcortical aspects of both parieto-occipital regions   History of loop recorder    History of radiation therapy    Vagina-02/11/21-03/14/21- Dr. Lynwood Nasuti   HLD (hyperlipidemia)    Hypothyroidism    LAFB (left anterior fascicular block)    Long term current use of antithrombotics/antiplatelets    a.) on DAPT therapy (ASA + clopidogrel )   Memory change    Migraine    Osteoarthritis    Osteoporosis    Palpitations    Personal history of radiation therapy    Premature menopause    QT prolongation    Seizures (HCC) 1994   history of mini seizures, possible migraine induced   Sleep apnea 1/24   Stage I adenocarcinoma of endometrium (HCC) 12/18/2020   a.) stage 1A; grade 1 (pT1a, pN0); b.) s/p TLH/BSO 12/18/2020 + adjuvant vaginal brachytherapy (30 Gy over 5 fractions between 02/11/2021 - 03/14/2021)   Vitamin D  deficiency      Past Surgical History:  Procedure Laterality  Date   ABDOMINAL HYSTERECTOMY  12/18/2020   ABLATION  2006   AXILLARY SENTINEL NODE BIOPSY Left 09/25/2021   Procedure: AXILLARY SENTINEL NODE BIOPSY;  Surgeon: Dessa Reyes ORN, MD;  Location: [facility] ORS;  Service: General;  Laterality: Left;   BREAST BIOPSY Left 08/21/2021   Stereo Bx, X-clip; path --> IMC (G1, ER/PR +, Her2/neu -)   BREAST LUMPECTOMY Left 09/25/2021   BREAST LUMPECTOMY WITH NEEDLE LOCALIZATION Left 09/25/2021   Procedure: BREAST LUMPECTOMY WITH NEEDLE LOCALIZATION;  Surgeon: Dessa Reyes ORN, MD;  Location: [facility] ORS;  Service: General;  Laterality: Left;   BREAST SURGERY  July 2023   BUBBLE STUDY  04/23/2020   Procedure: BUBBLE STUDY;  Surgeon: Rolan Ezra RAMAN, MD;  Location: [facility] ENDOSCOPY;  Service: Cardiovascular;;   CATARACT EXTRACTION W/ INTRAOCULAR LENS IMPLANT Bilateral    COLONOSCOPY     COLONOSCOPY WITH PROPOFOL  N/A 12/22/2018   Procedure: COLONOSCOPY WITH PROPOFOL ;  Surgeon: Janalyn Keene NOVAK, MD;  Location: [facility] ENDOSCOPY;  Service: Endoscopy;  Laterality: N/A;   COLONOSCOPY WITH PROPOFOL  N/A 04/16/2023   Procedure: COLONOSCOPY WITH PROPOFOL ;  Surgeon: Unk Corinn Skiff, MD;  Location: [facility] ENDOSCOPY;  Service: Gastroenterology;  Laterality: N/A;   EYE SURGERY  Spring 2018  Cataracts both eyes   HYSTEROSCOPY WITH D & C N/A 11/01/2020   Procedure: DILATATION AND CURETTAGE /HYSTEROSCOPY WITH MYOSURE;  Surgeon: Viktoria Comer SAUNDERS, MD;  Location: [facility] ORS;  Service: Gynecology;  Laterality: N/A;   LAPAROTOMY  12/18/2020   Procedure: LAPAROTOMY;  Surgeon: Viktoria Comer SAUNDERS, MD;  Location: [facility] ORS;  Service: Gynecology;;   LOOP RECORDER INSERTION N/A 04/23/2020   Procedure: LOOP RECORDER INSERTION;  Surgeon: Waddell Danelle ORN, MD;  Location: [facility] INVASIVE CV LAB;  Service: Cardiovascular;  Laterality: N/A;   ROBOTIC ASSISTED TOTAL HYSTERECTOMY WITH BILATERAL SALPINGO OOPHERECTOMY Bilateral 12/18/2020   Procedure: XI ROBOTIC ASSISTED TOTAL HYSTERECTOMY WITH  BILATERAL SALPINGO OOPHORECTOMY;  Surgeon: Viktoria Comer SAUNDERS, MD;  Location: [facility] ORS;  Service: Gynecology;  Laterality: Bilateral;   SENTINEL NODE BIOPSY N/A 12/18/2020   Procedure: SENTINEL NODE BIOPSY;  Surgeon: Viktoria Comer SAUNDERS, MD;  Location: [facility] OR[facility];  Service: Gynecology;  Laterality: N/A;   TEE WITHOUT CARDIOVERSION N/A 04/23/2020   Procedure: TRANSESOPHAGEAL ECHOCARDIOGRAM (TEE);  Surgeon: Rolan Ezra RAMAN, MD;  Location: [facility] ENDOSCOPY;  Service: Cardiovascular;  Laterality: N/A;   TONSILLECTOMY      Social History   Socioeconomic History   Marital status: Single    Spouse name: Not on file   Number of children: 1   Years of education: Not on file   Highest education level: Professional school degree (e.g., MD, DDS, DVM, JD)  Occupational History   Occupation: retired   Tobacco Use   Smoking status: Former    Current packs/day: 0.00    Average packs/day: 0.5 packs/day for 6.0 years (3.0 ttl pk-yrs)    Types: Cigarettes    Start date: 02/12/1968    Quit date: 02/11/1974    Years since quitting: 49.9   Smokeless tobacco: Never  Vaping Use   Vaping status: Never Used  Substance and Sexual Activity   Alcohol use: Yes    Comment: rare   Drug use: Never   Sexual activity: Not Currently    Comment: Don't use not sexually active  Other Topics Concern   Not on file  Social History Narrative   Raised an adopted child on her own   Working part time reviewed documents   Social Drivers of Corporate Investment Banker Strain: Low Risk  (12/22/2023)   Overall Financial Resource Strain (CARDIA)    Difficulty of Paying Living Expenses: Not very hard  Food Insecurity: No Food Insecurity (12/22/2023)   Hunger Vital Sign    Worried About Running Out of Food in the Last Year: Never true    Ran Out of Food in the Last Year: Never true  Transportation Needs: No Transportation Needs (12/22/2023)   PRAPARE - Administrator, Civil Service (Medical): No    Lack of  Transportation (Non-Medical): No  Physical Activity: Insufficiently Active (12/22/2023)   Exercise Vital Sign    Days of Exercise per Week: 2 days    Minutes of Exercise per Session: 30 min  Stress: No Stress Concern Present (12/22/2023)   [employer] of Occupational Health - Occupational Stress Questionnaire    Feeling of Stress: Only a little  Social Connections: Moderately Integrated (12/22/2023)   Social Connection and Isolation Panel    Frequency of Communication with Friends and Family: More than three times a week    Frequency of Social Gatherings with Friends and Family: More than three times a week    Attends Religious Services: More than 4 times per year  Active Member of Clubs or Organizations: Yes    Attends Banker Meetings: More than 4 times per year    Marital Status: Never married  Intimate Partner Violence: Not At Risk (04/09/2023)   Humiliation, Afraid, Rape, and Kick questionnaire    Fear of Current or Ex-Partner: No    Emotionally Abused: No    Physically Abused: No    Sexually Abused: No    Family History  Problem Relation Age of Onset   Early death Father        suicide   Depression Father    Diabetes Father    Cancer Father        prostate   Hearing loss Father        due to war   Alzheimer's disease Mother    Heart disease Brother    Atrial fibrillation Brother    Heart disease Maternal Aunt    Dementia Maternal Aunt    Heart disease Maternal Uncle    Dementia Maternal Grandmother    Arthritis Maternal Grandmother    Heart disease Brother    Atrial fibrillation Brother    Colon cancer Neg Hx    Breast cancer Neg Hx    Ovarian cancer Neg Hx    Pancreatic cancer Neg Hx    Endometrial cancer Neg Hx      Current Outpatient Medications:    alendronate  (FOSAMAX ) 70 MG tablet, TAKE 1 TABLET(70 MG) BY MOUTH 1 TIME A WEEK WITH A FULL GLASS OF WATER  AND ON AN EMPTY STOMACH, Disp: 12 tablet, Rfl: 0   aspirin  81 MG EC tablet, Take  by mouth., Disp: , Rfl:    aspirin -acetaminophen -caffeine (EXCEDRIN MIGRAINE) 250-250-65 MG tablet, Take by mouth every 6 (six) hours as needed for headache or migraine., Disp: , Rfl:    atorvastatin  (LIPITOR) 40 MG tablet, Take 1 tablet (40 mg total) by mouth daily., Disp: 90 tablet, Rfl: 1   buPROPion  (WELLBUTRIN  SR) 150 MG 12 hr tablet, Take 1 tablet (150 mg total) by mouth 2 (two) times daily., Disp: 180 tablet, Rfl: 1   calcium  carbonate (OSCAL) 1500 (600 Ca) MG TABS tablet, Take 600 mg of elemental calcium  by mouth 1 day or 1 dose., Disp: , Rfl:    cholecalciferol (VITAMIN D3) 25 MCG (1000 UNIT) tablet, Take 1,000 Units by mouth daily., Disp: , Rfl:    citalopram  (CELEXA ) 40 MG tablet, Take 1 tablet (40 mg total) by mouth daily., Disp: 90 tablet, Rfl: 1   Cyanocobalamin  (VITAMIN B-12) 1000 MCG SUBL, Place 1 tablet (1,000 mcg total) under the tongue 2 (two) times a week., Disp: 30 tablet, Rfl: 0   letrozole  (FEMARA ) 2.5 MG tablet, TAKE 1 TABLET(2.5 MG) BY MOUTH DAILY, Disp: 90 tablet, Rfl: 1   levothyroxine  (SYNTHROID ) 75 MCG tablet, Take 1 tablet (75 mcg total) by mouth daily before breakfast., Disp: 90 tablet, Rfl: 1   Multiple Vitamin (MULTIVITAMIN WITH MINERALS) TABS tablet, Take 1 tablet by mouth daily., Disp: , Rfl:    solifenacin  (VESICARE ) 10 MG tablet, Take 1 tablet (10 mg total) by mouth daily., Disp: 90 tablet, Rfl: 0   Ubrogepant  (UBRELVY ) 100 MG TABS, Take 100 mg by mouth daily as needed (migraines)., Disp: , Rfl:   Physical exam:  Vitals:   01/11/24 1040  BP: 132/84  Pulse: 68  Resp: 19  Temp: 97.6 F (36.4 C)  SpO2: 99%  Weight: 153 lb 11.2 oz (69.7 kg)   Physical Exam Cardiovascular:  Rate and Rhythm: Normal rate and regular rhythm.     Heart sounds: Normal heart sounds.  Pulmonary:     Effort: Pulmonary effort is normal.     Breath sounds: Normal breath sounds.  Skin:    General: Skin is warm and dry.  Neurological:     Mental Status: She is alert and  oriented to person, place, and time.      I have personally reviewed labs listed below:    Latest Ref Rng & Units 07/19/2023   12:05 AM  CMP  Glucose 70 - 99 mg/dL 881   BUN 8 - 23 mg/dL 14   Creatinine 9.55 - 1.00 mg/dL 9.39   Sodium 864 - 854 mmol/L 134   Potassium 3.5 - 5.1 mmol/L 3.8   Chloride 98 - 111 mmol/L 103   CO2 22 - 32 mmol/L 22   Calcium  8.9 - 10.3 mg/dL 8.6       Latest Ref Rng & Units 07/19/2023   12:05 AM  CBC  WBC 4.0 - 10.5 K/uL 3.7   Hemoglobin 12.0 - 15.0 g/dL 86.4   Hematocrit 63.9 - 46.0 % 42.5   Platelets 150 - 400 K/uL 136     Assessment and plan- Patient is a 73 y.o. adult with history of stage I invasive mammary carcinoma of the left breast ER positive PR negative HER2 negative status postlumpectomy and adjuvant radiation therapy presently on letrozole  here for routine follow-up  Assessment and Plan    Breast cancer On letrozole  for over a year without issues. Goal: five-year duration. - Continue letrozole  therapy.  Osteoporosis Bone density stable. On calcium  and Fosamax . Plan to continue Fosamax  for at least three years, reassess for drug holiday if stable. - Continue calcium  supplementation daily. - Continue Fosamax  once a week for at least three years. - Reassess bone density after three years for potential drug holiday.         Visit Diagnosis 1. Encounter for follow-up surveillance of breast cancer   2. Use of letrozole  (Femara )   3. Age-related osteoporosis without current pathological fracture      Dr. Annah Skene, MD, MPH [facility] [phone] 01/11/2024 1:19 PM

## 2024-01-18 ENCOUNTER — Ambulatory Visit: Admitting: Oncology

## 2024-02-19 ENCOUNTER — Other Ambulatory Visit: Payer: Self-pay | Admitting: Family Medicine

## 2024-02-19 DIAGNOSIS — E785 Hyperlipidemia, unspecified: Secondary | ICD-10-CM

## 2024-02-19 DIAGNOSIS — Z8673 Personal history of transient ischemic attack (TIA), and cerebral infarction without residual deficits: Secondary | ICD-10-CM

## 2024-02-19 MED ORDER — ATORVASTATIN CALCIUM 40 MG PO TABS: 40.0000 mg | ORAL_TABLET | Freq: Every day | ORAL | 0 refills | Status: AC

## 2024-02-19 NOTE — Telephone Encounter (Signed)
 Copied from CRM #[id number]. Topic: Clinical - Medication Refill >> Feb 19, 2024  2:41 PM Natalie [name] wrote: Medication: atorvastatin  (LIPITOR) 40 MG tablet   Has the patient contacted their pharmacy? Yes (Agent: If no, request that the patient contact the pharmacy for the refill. If patient does not wish to contact the pharmacy document the reason why and proceed with request.) (Agent: If yes, when and what did the pharmacy advise?)  This is the patient's preferred pharmacy:  [facility] [address] [facility] - [place], [place] - [address] [address] [facility] [address] [place] [address] Phone: [phone] Fax: [phone]  Is this the correct pharmacy for this prescription? Yes If no, delete pharmacy and type the correct one.   Has the prescription been filled recently? Yes  Is the patient out of the medication? Yes  Has the patient been seen for an appointment in the last year OR does the patient have an upcoming appointment? Yes  Can we respond through MyChart? Yes  Agent: Please be advised that Rx refills may take up to 3 business days. We ask that you follow-up with your pharmacy.

## 2024-02-19 NOTE — Telephone Encounter (Signed)
 Requested Prescriptions  Pending Prescriptions Disp Refills   atorvastatin  (LIPITOR) 40 MG tablet 90 tablet 0    Sig: Take 1 tablet (40 mg total) by mouth daily.     Cardiovascular:  Antilipid - Statins Failed - 02/19/2024  3:40 PM      Failed - Lipid Panel in normal range within the last 12 months    Cholesterol, Total  Date Value Ref Range Status  06/28/2015 185 100 - 199 mg/dL Final   Cholesterol  Date Value Ref Range Status  04/13/2023 100 <200 mg/dL Final   LDL Cholesterol (Calc)  Date Value Ref Range Status  04/13/2023 49 mg/dL (calc) Final    Comment:    Reference range: <100 . Desirable range <100 mg/dL for primary prevention;   <70 mg/dL for patients with CHD or diabetic patients  with > or = 2 CHD risk factors. SABRA LDL-C is now calculated using the Martin-Hopkins  calculation, which is a validated novel method providing  better accuracy than the Friedewald equation in the  estimation of LDL-C.  Gladis APPLETHWAITE et al. SANDREA. 7986;689(80): 2061-2068  (http://education.[facility].com/faq/FAQ164)    HDL  Date Value Ref Range Status  04/13/2023 38 (L) > OR = 50 mg/dL Final  94/74/7982 33 (L) >39 mg/dL Final   Triglycerides  Date Value Ref Range Status  04/13/2023 46 <150 mg/dL Final         Passed - Patient is not pregnant      Passed - Valid encounter within last 12 months    Recent Outpatient Visits           1 month ago Nocturia   [facility] Glenard Mire, MD   2 months ago Productive cough   [facility] Glenard Mire, MD   4 months ago History of CVA (cerebrovascular accident)   [facility] Glenard Mire, MD   9 months ago Abnormal bruising   [facility] Bernardo Fend, DO   10 months ago Major depression, recurrent, chronic   [facility] Glenard Mire, MD       Future Appointments             In 1  month Glenard, Krichna, MD [facility] [place]

## 2024-02-22 NOTE — Telephone Encounter (Unsigned)
 Copied from CRM [id number]. Topic: General - Other >> Feb 19, 2024  2:39 PM Delon DASEN wrote: Reason for CRM: changed pharmacy to [facility]

## 2024-04-12 ENCOUNTER — Ambulatory Visit: Admitting: Family Medicine

## 2024-04-14 ENCOUNTER — Ambulatory Visit

## 2024-06-23 ENCOUNTER — Ambulatory Visit: Admitting: Radiation Oncology

## 2024-07-11 ENCOUNTER — Inpatient Hospital Stay: Admitting: Oncology

## 2024-08-31 ENCOUNTER — Ambulatory Visit: Admitting: Radiation Oncology

## 2024-12-13 ENCOUNTER — Inpatient Hospital Stay: Admitting: Gynecologic Oncology
